# Patient Record
Sex: Male | Born: 1945 | Race: White | Hispanic: No | Marital: Single | Smoking: Former smoker
Health system: Southern US, Community
[De-identification: ages and names within clinical notes are randomized; demographics above are authoritative.]

## PROBLEM LIST (undated history)

## (undated) DIAGNOSIS — Z87442 Personal history of urinary calculi: Secondary | ICD-10-CM

## (undated) DIAGNOSIS — T8859XA Other complications of anesthesia, initial encounter: Secondary | ICD-10-CM

## (undated) DIAGNOSIS — I509 Heart failure, unspecified: Secondary | ICD-10-CM

## (undated) DIAGNOSIS — I219 Acute myocardial infarction, unspecified: Secondary | ICD-10-CM

## (undated) DIAGNOSIS — N179 Acute kidney failure, unspecified: Secondary | ICD-10-CM

## (undated) DIAGNOSIS — M1990 Unspecified osteoarthritis, unspecified site: Secondary | ICD-10-CM

## (undated) DIAGNOSIS — I739 Peripheral vascular disease, unspecified: Secondary | ICD-10-CM

## (undated) DIAGNOSIS — C801 Malignant (primary) neoplasm, unspecified: Secondary | ICD-10-CM

## (undated) DIAGNOSIS — N390 Urinary tract infection, site not specified: Secondary | ICD-10-CM

## (undated) DIAGNOSIS — I10 Essential (primary) hypertension: Secondary | ICD-10-CM

## (undated) DIAGNOSIS — R519 Headache, unspecified: Secondary | ICD-10-CM

## (undated) DIAGNOSIS — J449 Chronic obstructive pulmonary disease, unspecified: Secondary | ICD-10-CM

## (undated) DIAGNOSIS — S0292XA Unspecified fracture of facial bones, initial encounter for closed fracture: Secondary | ICD-10-CM

## (undated) DIAGNOSIS — I209 Angina pectoris, unspecified: Secondary | ICD-10-CM

## (undated) DIAGNOSIS — Z9289 Personal history of other medical treatment: Secondary | ICD-10-CM

## (undated) DIAGNOSIS — R0602 Shortness of breath: Secondary | ICD-10-CM

## (undated) DIAGNOSIS — R51 Headache: Secondary | ICD-10-CM

## (undated) DIAGNOSIS — S82121K Displaced fracture of lateral condyle of right tibia, subsequent encounter for closed fracture with nonunion: Secondary | ICD-10-CM

## (undated) DIAGNOSIS — K219 Gastro-esophageal reflux disease without esophagitis: Secondary | ICD-10-CM

## (undated) DIAGNOSIS — T4145XA Adverse effect of unspecified anesthetic, initial encounter: Secondary | ICD-10-CM

## (undated) DIAGNOSIS — F419 Anxiety disorder, unspecified: Secondary | ICD-10-CM

## (undated) DIAGNOSIS — I2510 Atherosclerotic heart disease of native coronary artery without angina pectoris: Secondary | ICD-10-CM

## (undated) HISTORY — DX: Essential (primary) hypertension: I10

## (undated) HISTORY — DX: Atherosclerotic heart disease of native coronary artery without angina pectoris: I25.10

## (undated) HISTORY — PX: CHOLECYSTECTOMY: SHX55

## (undated) HISTORY — DX: Unspecified fracture of facial bones, initial encounter for closed fracture: S02.92XA

## (undated) HISTORY — DX: Malignant (primary) neoplasm, unspecified: C80.1

## (undated) HISTORY — PX: OTHER SURGICAL HISTORY: SHX169

## (undated) HISTORY — DX: Acute kidney failure, unspecified: N17.9

## (undated) HISTORY — PX: CARDIAC CATHETERIZATION: SHX172

## (undated) HISTORY — PX: HERNIA REPAIR: SHX51

---

## 2006-08-15 ENCOUNTER — Emergency Department (HOSPITAL_COMMUNITY): Admission: EM | Admit: 2006-08-15 | Discharge: 2006-08-15 | Payer: Self-pay | Admitting: Emergency Medicine

## 2007-03-07 HISTORY — PX: OTHER SURGICAL HISTORY: SHX169

## 2007-10-25 ENCOUNTER — Ambulatory Visit: Payer: Self-pay | Admitting: Internal Medicine

## 2007-10-25 ENCOUNTER — Inpatient Hospital Stay (HOSPITAL_COMMUNITY): Admission: EM | Admit: 2007-10-25 | Discharge: 2007-10-29 | Payer: Self-pay | Admitting: Emergency Medicine

## 2008-03-20 ENCOUNTER — Ambulatory Visit: Payer: Self-pay | Admitting: Cardiology

## 2008-12-16 ENCOUNTER — Other Ambulatory Visit: Payer: Self-pay | Admitting: Internal Medicine

## 2008-12-21 ENCOUNTER — Other Ambulatory Visit: Payer: Self-pay | Admitting: Internal Medicine

## 2009-01-01 ENCOUNTER — Other Ambulatory Visit: Payer: Self-pay | Admitting: Internal Medicine

## 2009-02-04 ENCOUNTER — Ambulatory Visit: Payer: Self-pay | Admitting: Internal Medicine

## 2009-05-07 ENCOUNTER — Ambulatory Visit: Payer: Self-pay | Admitting: Internal Medicine

## 2009-05-07 ENCOUNTER — Inpatient Hospital Stay: Payer: Self-pay | Admitting: Internal Medicine

## 2009-05-18 ENCOUNTER — Other Ambulatory Visit: Payer: Self-pay | Admitting: Internal Medicine

## 2009-06-01 ENCOUNTER — Other Ambulatory Visit: Payer: Self-pay | Admitting: Internal Medicine

## 2009-06-09 ENCOUNTER — Ambulatory Visit: Payer: Self-pay | Admitting: Urology

## 2009-08-18 ENCOUNTER — Ambulatory Visit: Payer: Self-pay | Admitting: Urology

## 2009-09-01 ENCOUNTER — Ambulatory Visit: Payer: Self-pay | Admitting: Urology

## 2009-10-12 ENCOUNTER — Inpatient Hospital Stay: Payer: Self-pay | Admitting: Internal Medicine

## 2010-07-19 NOTE — Discharge Summary (Signed)
NAMEJHAIR, WITHERINGTON NO.:  [id number]   MEDICAL RECORD NO.:  [id number]          PATIENT TYPE:  INP   LOCATION:  6524                         FACILITY:  [facility]   PHYSICIAN:  Veverly Fells. Excell Seltzer, MD  DATE OF BIRTH:  10-13-1945   DATE OF ADMISSION:  10/25/2007  DATE OF DISCHARGE:  10/29/2007                         DISCHARGE SUMMARY - REFERRING   DISCHARGE DIAGNOSES:  1. Non-ST elevated myocardial infarction with medical treatment for a      small occluded obtuse marginal 2 branch.  2. Hypertension.  3. Hyperlipidemia.  4. Tobacco use.  5. Alcohol use.  6. Thrombocytopenia.  7. Hyperglycemia with a normal hemoglobin A1c.  8. Slightly elevated TSH history as previously.   PROCEDURE PERFORMED:  Cardiac catheterization on October 28, 2007 by Dr.  Tonny Bollman.   BRIEF HISTORY:  Mr. Bruce [name] is a 65 year old white male who presented to  [facility] emergency room complaining of sudden onset of diaphoresis  followed by substernal chest discomfort associated with shortness of  breath and nausea while talking to friends.  He rested, without relief,  went home,  but his symptoms persisted.  The symptoms started around 11  a.m. and he finally presented to the emergency room around 6 o'clock.  After receiving IV nitroglycerin and aspirin, his discomfort had been  reduced from an 8 to a 3.  Enzymes were abnormal..   PAST MEDICAL HISTORY:  Notable for tobacco and alcohol use.   LABORATORY DATA:  Admission weight was 78 kg.  Admission H and H was  15.1 and 43.9, normal indices, platelets 183, WBCs 9.1.  Platelets  dropped on the October 26, 2007 to 127.  At the time of discharge, H and  H was 12.8 and 37.8, normal indices, platelets had increased to 141.  WBCs 7.4.  Admission PTT 31, PT 14.0.  Sodium 139, potassium 4.7, BUN  10, creatinine 1.0, glucose 105.  LFTs were within normal limits.  At  the time of discharge sodium was 141, potassium 4.0, BUN 6, creatinine  0.85,  glucose 87.  Hemoglobin A1c was 5.5.  Initial CK totals were  within normal limits.  However troponins were slightly elevated at 1.45,  0.72 and 0.35.  Fasting lipids on October 26, 2007 showed a total  cholesterol of 150, triglycerides of 60, HDL 37, LDL 101.  TSH was  slightly elevated at 5.215.  Heparin antibody screen on the October 27, 2007 was negative. Chest x-ray on October 25, 2007 did not show any  evidence of active disease.  EKG on admission showed normal sinus  rhythm, normal axis, nonspecific ST-T wave changes.  Subsequent EKGs  were essentially the same.   HOSPITAL COURSE:  The patient was admitted to [facility].  He  was placed on. Integrilin and low fractionated heparin, in addition to  aspirin and beta blocker.  Overnight, he did not have any further chest  discomfort.  Troponins were abnormal, indicating non-ST elevated  myocardial infarction.  His platelets dropped, but a  the HIT screen was  unremarkable.  Given the stable nature, catheterization was arranged  for  Monday.  He was counseled in regards to tobacco cessation.  TSH was  noted to be slightly elevated.  Thus a free T4 and T3 were ordered, but  are pending at the time of this dictation.  Cardiac rehab assisted with  ambulation and education.  By October 29, 2007, the patient was doing  well, ambulating the halls without difficulty.  It was felt that the  patient could be discharged home after Dr. Earmon Phoenix review.   DISPOSITION:  The patient is discharged home.  He is asked to maintain  low-sodium heart-healthy diet.  Activities are restricted in regards to  lifting, driving.  sexual activity or heavy exertion for 1 week.  Wound  care as per post catheterization supplemental sheet.  New medications  include aspirin 325 mg daily, metoprolol 25 mg b.i.d., Zocor 40 mg  nightly, nitroglycerin 0.4 as needed.  He was informed he could get his  medications at [facility] for 4 dollars.  He will need blood work in  6-8  weeks in regards to FLP and LFTs, since Zocor was initiated.  He was  advised no smoking or tobacco products and limit his alcohol to less  than 2 ounces of 2 beers a day.  He was asked to obtain a primary care  physician and to bring all his medications to all appointments,  particularly when he follows up with Dr. Excell Seltzer in the [facility] office  on November 12, 2007 at 2:15.   DISCHARGE TIME:  35 minutes.      Joellyn Rued, PA-C      Veverly Fells. Excell Seltzer, MD  Electronically Signed    EW/MEDQ  D:  10/29/2007  T:  10/29/2007  Job:  [id number]   cc:   Bevelyn Buckles. Bensimhon, MD  Veverly Fells. Excell Seltzer, MD

## 2010-07-19 NOTE — Assessment & Plan Note (Signed)
[facility]                            CARDIOLOGY OFFICE NOTE   NAME:Bruce [name], Bruce [name]                         MRN:          [id number]  DATE:03/20/2008                            DOB:          May 26, 1945    Mr. Bruce [name] is a 65 year old gentleman who was previously seen by Dr.  Excell [name].  On October 25, 2007, he was admitted to [facility] with  chest pain.  During that hospitalization, his troponins were abnormal.  He subsequently underwent cardiac catheterization on October 28, 2007.  His ejection fraction was 50%.  There is inferior hypokinesis.  There  was 30% proximal LAD.  There was 100% ostial lesion in the second  marginal, which is a very small vessel.  There was a 40% proximal lesion  in the right coronary artery.  It was felt that medical therapy was  indicated because the marginal had the appearance of a total occlusion  and the distal vessel did not feel via collaterals.  It is also felt not  amenable to PCI.  He was, therefore, treated medically and discharged.  Note, his TSH was minimally elevated during that hospitalization, but  his T3 and T4 were normal.  He also had low-platelet count, but  HIT  panel was normal by report.  The patient has had dyspnea on exertion  following discharge.  However, approximately 2 weeks ago, he developed  swelling in his left lower extremity followed by increased dyspnea.  He  apparently was admitted to [facility] and found to have deep venous  thrombosis as well as multiple pulmonary emboli.  He had an IVC filter  placed.  He also had an echocardiogram.  The right ventricle is mildly  dilated.  The right atrium is also mildly dilated.  Ejection fraction  was 45%.  The patient was placed on Coumadin.  He now returns for  followup.  Again, he has dyspnea on exertion.  There is no orthopnea,  PND, palpitations, presyncope, syncope, or exertional chest pain.   MEDICATIONS:  1. Coumadin as directed and  followed by Dr. Vear [name].  2. Pravachol 40 mg p.o. daily.  3. Metoprolol 25 mg p.o. b.i.d.  4. Enalapril 5 mg p.o. daily.  5. Aspirin 81 mg p.o. daily.   PHYSICAL EXAMINATION:  VITAL SIGNS:  Today, shows a blood pressure of  136/90 and his pulse is 73.  He weighs 184 pounds.  HEENT:  Normal.  NECK:  Supple.  CHEST:  Clear.  CARDIOVASCULAR:  Regular rate and rhythm.  ABDOMEN:  No tenderness.  EXTREMITIES:  Trace edema on the left.   DIAGNOSES:  1. Coronary artery disease - the patient has had no further chest      pain.  He will continue with his aspirin, ACE inhibitor, beta-      blocker, and statin.  Note, he has discontinued his tobacco use.  2. Recent deep vein thrombosis/pulmonary emboli - he will continue on      his Coumadin and this is being monitored by Dr. Vear [name].  3. Recent increased TSH - we will plan to  repeat his TSH in 1 week.  4. Hypertension - his blood pressure is elevated and I have asked him      to increase his enalapril to 10 mg p.o. daily.  We will check a      BMET in 1 week to follow his potassium and renal function.  I will      also check lipids and liver at that time.  5. Hyperlipidemia - he will continue on his Pravachol.  6. History of alcohol use.   I will ask him to follow up in [place] in approximately 3 months with  Dr. Excell [name].     Bruce Frieze Jens Som, MD, [facility]  Electronically Signed    BSC/MedQ  DD: 03/20/2008  DT: 03/21/2008  Job #: [id number]   cc:   Bruce [name]
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
## 2010-08-16 ENCOUNTER — Ambulatory Visit (INDEPENDENT_AMBULATORY_CARE_PROVIDER_SITE_OTHER): Payer: Medicaid Other | Admitting: Cardiovascular Disease

## 2010-08-16 ENCOUNTER — Encounter: Payer: Self-pay | Admitting: Cardiovascular Disease

## 2010-08-16 DIAGNOSIS — I2510 Atherosclerotic heart disease of native coronary artery without angina pectoris: Secondary | ICD-10-CM | POA: Insufficient documentation

## 2010-08-16 DIAGNOSIS — J449 Chronic obstructive pulmonary disease, unspecified: Secondary | ICD-10-CM | POA: Insufficient documentation

## 2010-08-16 DIAGNOSIS — I82409 Acute embolism and thrombosis of unspecified deep veins of unspecified lower extremity: Secondary | ICD-10-CM | POA: Insufficient documentation

## 2010-08-16 DIAGNOSIS — R252 Cramp and spasm: Secondary | ICD-10-CM

## 2010-08-16 DIAGNOSIS — J441 Chronic obstructive pulmonary disease with (acute) exacerbation: Secondary | ICD-10-CM | POA: Insufficient documentation

## 2010-08-16 DIAGNOSIS — R609 Edema, unspecified: Secondary | ICD-10-CM | POA: Insufficient documentation

## 2010-08-16 HISTORY — DX: Acute embolism and thrombosis of unspecified deep veins of unspecified lower extremity: I82.409

## 2010-08-16 HISTORY — DX: Cramp and spasm: R25.2

## 2010-08-16 HISTORY — DX: Edema, unspecified: R60.9

## 2010-08-16 MED ORDER — FUROSEMIDE 20 MG PO TABS: 20.0000 mg | ORAL_TABLET | Freq: Every day | ORAL | Status: DC

## 2010-08-16 NOTE — Patient Instructions (Signed)
Stop simvastatin for two weeks. If leg pain goes away, call the office. If you continue to have leg pain, restart the simvastatin. Start furosemide one a day in the AM for the swelling. When the swelling goes away, stop the furosemide. Take only for swelling. What how much you are drinking. Please call us if you have new issues that need to be addressed before your next appt.  We will call you for a follow up Appt. In 3 months

## 2010-08-16 NOTE — Assessment & Plan Note (Signed)
He has mild shortness of breath. He reports that he does not take any inhalers. He does have a 50 year smoking history. We did not check his oxygenation today. We'll try to obtain the echocardiogram from last year to confirm his right-sided pressures.

## 2010-08-16 NOTE — Assessment & Plan Note (Signed)
Currently with no symptoms of angina. No further workup at this time. Continue current medication regimen. 

## 2010-08-16 NOTE — Progress Notes (Signed)
   Patient ID: Bruce [name], male    DOB: 1946/02/23, 65 y.o.   MRN: [id number]  HPI Comments: Bruce [name] is a very pleasant 65 year old gentleman, patient of Dr. 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  He reports that he is unable to walk very far as his legs get very weak. He also has weakness of the muscles and cramping of his legs at nighttime. He has had worsening swelling of his lower extremities, worse on the left than the right. He is unable to put his shoes on as easily as before. He was recently started on HCTZ though he reports he has not had any significant diuresis and continues to have difficulty putting his shoes on. He does drink a significant amount of fluids and will finish a 2 L bottle of soda in 2 days among other drinks as well. He denies any significant change any shortness of breath which is mild at baseline.   EKG shows normal sinus rhythm with rate 65 beats per minute with no significant ST-T wave changes     Review of Systems  Constitutional: Positive for fatigue.  HENT: Negative.   Eyes: Negative.   Respiratory: Positive for shortness of breath.   Cardiovascular: Positive for leg swelling.  Gastrointestinal: Negative.   Musculoskeletal: Positive for myalgias and gait problem.  Skin: Negative.   Neurological: Negative.   Hematological: Negative.   Psychiatric/Behavioral: Negative.   All other systems reviewed and are negative.    BP 142/87  Pulse 65  Ht 6\' 1"  (1.854 m)  Wt 223 lb (101.152 kg)  BMI 29.42 kg/m2   Physical Exam  Nursing note and vitals reviewed. Constitutional: He is oriented to person, place,  and time. He appears well-developed and well-nourished.  HENT:  Head: Normocephalic.  Nose: Nose normal.  Mouth/Throat: Oropharynx is clear and moist.  Eyes: Conjunctivae are normal. Pupils are equal, round, and reactive to light.  Neck: Normal range of motion. Neck supple. No JVD present.  Cardiovascular: Normal rate, regular rhythm, S1 normal, S2 normal, normal heart sounds and intact distal pulses.  Exam reveals no gallop and no friction rub.   No murmur heard.      1+ pitting edema on the left lower extremity, trace edema on the right to below the knees also with some component of nonpitting edema  Pulmonary/Chest: Effort normal. No respiratory distress. He has decreased breath sounds. He has no wheezes. He has no rales. He exhibits no tenderness.  Abdominal: Soft. Bowel sounds are normal. He exhibits no distension. There is no tenderness.  Musculoskeletal: Normal range of motion. He exhibits edema. He exhibits no tenderness.  Lymphadenopathy:    He has no cervical adenopathy.  Neurological: He is alert and oriented to person, place, and time. Coordination normal.  Skin: Skin is warm and dry. No rash noted. No erythema.  Psychiatric: He has a normal mood and affect. His behavior is normal. Judgment and thought content normal.           Assessment and Plan

## 2010-08-16 NOTE — Assessment & Plan Note (Signed)
He does have leg weakness and cramping, particularly at nighttime. We have suggested that he hold the simvastatin for a two-week trial period. If his cramping and muscle weakness seemed to improve, we could try an alternate statin.

## 2010-08-16 NOTE — Assessment & Plan Note (Addendum)
I suspect he has chronic venous insufficiency from his history of DVTs. Given his recent exacerbation of the edema, I suspect he may have underlying pulmonary hypertension secondary to his lung disease and long history of smoking. This could contribute to fluid retention and worsening edema. We have suggested he start Lasix 20 mg daily with his HCTZ. I suggested he hold the Lasix when his edema improves and takes only as needed. His BNP was relatively normal. Given this, if there is no significant improvement with the Lasix, his edema could potentially be all secondary to worsening venous insufficiency and he will have to wear TED hose.

## 2010-08-16 NOTE — Assessment & Plan Note (Signed)
He will be be at risk of repeat DVT given his history before and his underlying venous insufficiency. He is on warfarin.

## 2010-09-12 ENCOUNTER — Encounter: Payer: Self-pay | Admitting: Cardiovascular Disease

## 2010-11-16 ENCOUNTER — Ambulatory Visit: Payer: Medicaid Other | Admitting: Cardiovascular Disease

## 2010-12-02 ENCOUNTER — Encounter: Payer: Self-pay | Admitting: Cardiovascular Disease

## 2010-12-05 ENCOUNTER — Other Ambulatory Visit: Payer: Self-pay | Admitting: *Deleted

## 2010-12-05 DIAGNOSIS — Z7901 Long term (current) use of anticoagulants: Secondary | ICD-10-CM

## 2010-12-05 LAB — PROTIME-INR: INR: 1.28 (ref <1.50)

## 2010-12-16 ENCOUNTER — Telehealth: Payer: Self-pay | Admitting: Internal Medicine

## 2010-12-16 ENCOUNTER — Emergency Department: Payer: Self-pay | Admitting: *Deleted

## 2010-12-16 NOTE — Telephone Encounter (Signed)
Pt son called  Bruce [name] has medicaid [employer].  His son talked Bruce [name] Bruce [name] case Financial controller.  She said the only way Bruce [name] can get his medicaid changed was if you call [place].  His caseworker didn't give him a name or number to call.   Bruce [name] is out of his meds he took them last night.  Pt is having chest and leg pains.

## 2010-12-22 LAB — I-STAT 8, (EC8 V) (CONVERTED LAB)
BUN: 14
Bicarbonate: 25.5 — ABNORMAL HIGH
Glucose, Bld: 89
HCT: 48
Hemoglobin: 16.3
Operator id: 189501
Potassium: 4.3
Sodium: 140

## 2010-12-29 ENCOUNTER — Ambulatory Visit: Payer: Self-pay | Admitting: Cardiovascular Disease

## 2011-01-10 MED ORDER — ALPRAZOLAM 1 MG PO TABS: 1.0000 mg | ORAL_TABLET | Freq: Two times a day (BID) | ORAL | Status: DC | PRN

## 2011-01-10 NOTE — Telephone Encounter (Signed)
Pharm sent fax requesting RF of alprazolam 1 mg (not extended release) 1 tab bid. This message was routed to the wrong Walker on 10/12. I will call to check on patient and status of medicaid, is this ok to RF?

## 2011-01-10 NOTE — Telephone Encounter (Signed)
Fine to refill 1 month with 3 refills. We need to try to get him in.

## 2011-01-17 NOTE — Telephone Encounter (Signed)
Spoke w/son, Pt has been seen by Dr Welton Flakes recently but can not afford to return for f/u at this time. I will obtain medicaid form for MD to complete to try to change from [place] access to regular medicaid.

## 2011-02-07 ENCOUNTER — Other Ambulatory Visit: Payer: Self-pay | Admitting: *Deleted

## 2011-02-07 MED ORDER — HYDROCHLOROTHIAZIDE 12.5 MG PO CAPS: 12.5000 mg | ORAL_CAPSULE | Freq: Every day | ORAL | Status: DC

## 2011-02-07 MED ORDER — LORATADINE 10 MG PO TABS: 10.0000 mg | ORAL_TABLET | Freq: Every day | ORAL | Status: DC

## 2011-02-07 MED ORDER — METOPROLOL SUCCINATE ER 50 MG PO TB24: 50.0000 mg | ORAL_TABLET | Freq: Every day | ORAL | Status: DC

## 2011-04-12 ENCOUNTER — Other Ambulatory Visit: Payer: Self-pay | Admitting: *Deleted

## 2011-04-12 MED ORDER — LORATADINE 10 MG PO TABS: 10.0000 mg | ORAL_TABLET | Freq: Every day | ORAL | Status: DC

## 2011-04-12 MED ORDER — OMEPRAZOLE 20 MG PO CPDR: 20.0000 mg | DELAYED_RELEASE_CAPSULE | Freq: Every day | ORAL | Status: DC

## 2011-04-12 MED ORDER — METOPROLOL SUCCINATE ER 50 MG PO TB24: 50.0000 mg | ORAL_TABLET | Freq: Every day | ORAL | Status: DC

## 2011-04-12 MED ORDER — HYDROCHLOROTHIAZIDE 12.5 MG PO CAPS: 12.5000 mg | ORAL_CAPSULE | Freq: Every day | ORAL | Status: DC

## 2011-05-04 ENCOUNTER — Other Ambulatory Visit: Payer: Self-pay | Admitting: *Deleted

## 2011-05-04 MED ORDER — LISINOPRIL 30 MG PO TABS: 30.0000 mg | ORAL_TABLET | Freq: Every day | ORAL | Status: DC

## 2011-05-04 NOTE — Telephone Encounter (Signed)
Is this patient going to be seen at our clinic?

## 2011-05-04 NOTE — Telephone Encounter (Signed)
Patient says that he is not going to be coming here to be seen. He says he was told that with his new insurance [facility] is not in network, and he is working on getting get set up with another Dr., but is not sure when or where and he is asking if you would be willing to give him at least one refill until he is established with some one else.

## 2011-05-04 NOTE — Telephone Encounter (Signed)
Fine to refill x 1.  Please let him know that we would love to keep him as a patient.  I think he has medicaid and we except medicaid, just not [place] access.

## 2011-05-05 MED ORDER — ALPRAZOLAM 1 MG PO TABS: 1.0000 mg | ORAL_TABLET | Freq: Two times a day (BID) | ORAL | Status: AC | PRN

## 2011-05-05 NOTE — Telephone Encounter (Signed)
Rx called to pharmacy. Patient says that he does want to stay with Dr. Dan Humphreys and is trying to get his [place] access medicaid changed to regular medicaid.

## 2011-08-09 ENCOUNTER — Other Ambulatory Visit: Payer: Self-pay | Admitting: *Deleted

## 2011-08-09 MED ORDER — HYDROCHLOROTHIAZIDE 12.5 MG PO CAPS: 12.5000 mg | ORAL_CAPSULE | Freq: Every day | ORAL | Status: DC

## 2011-08-09 MED ORDER — WARFARIN SODIUM 1 MG PO TABS: 1.0000 mg | ORAL_TABLET | ORAL | Status: DC

## 2011-08-09 MED ORDER — LISINOPRIL 30 MG PO TABS: 30.0000 mg | ORAL_TABLET | Freq: Every day | ORAL | Status: DC

## 2011-09-04 ENCOUNTER — Other Ambulatory Visit: Payer: Self-pay | Admitting: *Deleted

## 2011-09-04 NOTE — Telephone Encounter (Signed)
Spoke with pt son and informed him to tell pt to return call to set up f/u appointment.

## 2012-03-06 HISTORY — PX: FRACTURE SURGERY: SHX138

## 2012-05-07 ENCOUNTER — Ambulatory Visit: Payer: Self-pay | Admitting: Family Medicine

## 2013-06-26 ENCOUNTER — Ambulatory Visit: Payer: Self-pay | Admitting: Cardiovascular Disease

## 2013-10-06 ENCOUNTER — Emergency Department (HOSPITAL_COMMUNITY): Payer: Medicare HMO

## 2013-10-06 ENCOUNTER — Inpatient Hospital Stay (HOSPITAL_COMMUNITY)
Admission: EM | Admit: 2013-10-06 | Discharge: 2013-10-11 | DRG: 492 | Disposition: A | Discharge status: Home Health | Payer: Medicare HMO | Attending: General Surgery | Admitting: General Surgery

## 2013-10-06 ENCOUNTER — Emergency Department (HOSPITAL_COMMUNITY): Payer: Medicare HMO | Admitting: Certified Registered"

## 2013-10-06 ENCOUNTER — Encounter (HOSPITAL_COMMUNITY): Payer: Self-pay | Admitting: *Deleted

## 2013-10-06 ENCOUNTER — Encounter (HOSPITAL_COMMUNITY): Admission: EM | Disposition: A | Discharge status: Home Health | Payer: Self-pay | Source: Home / Self Care

## 2013-10-06 ENCOUNTER — Encounter (HOSPITAL_COMMUNITY): Payer: Medicare HMO | Admitting: Certified Registered"

## 2013-10-06 DIAGNOSIS — Z86718 Personal history of other venous thrombosis and embolism: Secondary | ICD-10-CM | POA: Diagnosis not present

## 2013-10-06 DIAGNOSIS — S82409A Unspecified fracture of shaft of unspecified fibula, initial encounter for closed fracture: Secondary | ICD-10-CM

## 2013-10-06 DIAGNOSIS — T798XXA Other early complications of trauma, initial encounter: Secondary | ICD-10-CM | POA: Diagnosis present

## 2013-10-06 DIAGNOSIS — I959 Hypotension, unspecified: Secondary | ICD-10-CM | POA: Diagnosis present

## 2013-10-06 DIAGNOSIS — S82109A Unspecified fracture of upper end of unspecified tibia, initial encounter for closed fracture: Principal | ICD-10-CM | POA: Diagnosis present

## 2013-10-06 DIAGNOSIS — X58XXXA Exposure to other specified factors, initial encounter: Secondary | ICD-10-CM | POA: Diagnosis present

## 2013-10-06 DIAGNOSIS — D62 Acute posthemorrhagic anemia: Secondary | ICD-10-CM | POA: Diagnosis not present

## 2013-10-06 DIAGNOSIS — S82209A Unspecified fracture of shaft of unspecified tibia, initial encounter for closed fracture: Secondary | ICD-10-CM | POA: Diagnosis present

## 2013-10-06 DIAGNOSIS — S8991XA Unspecified injury of right lower leg, initial encounter: Secondary | ICD-10-CM

## 2013-10-06 DIAGNOSIS — T1490XA Injury, unspecified, initial encounter: Secondary | ICD-10-CM

## 2013-10-06 DIAGNOSIS — I10 Essential (primary) hypertension: Secondary | ICD-10-CM | POA: Diagnosis present

## 2013-10-06 DIAGNOSIS — T79A0XA Compartment syndrome, unspecified, initial encounter: Secondary | ICD-10-CM

## 2013-10-06 DIAGNOSIS — S85009A Unspecified injury of popliteal artery, unspecified leg, initial encounter: Secondary | ICD-10-CM | POA: Diagnosis present

## 2013-10-06 DIAGNOSIS — K219 Gastro-esophageal reflux disease without esophagitis: Secondary | ICD-10-CM | POA: Diagnosis present

## 2013-10-06 DIAGNOSIS — J45909 Unspecified asthma, uncomplicated: Secondary | ICD-10-CM | POA: Diagnosis present

## 2013-10-06 DIAGNOSIS — T148XXA Other injury of unspecified body region, initial encounter: Secondary | ICD-10-CM

## 2013-10-06 DIAGNOSIS — F411 Generalized anxiety disorder: Secondary | ICD-10-CM | POA: Diagnosis present

## 2013-10-06 DIAGNOSIS — Z7901 Long term (current) use of anticoagulants: Secondary | ICD-10-CM

## 2013-10-06 DIAGNOSIS — I252 Old myocardial infarction: Secondary | ICD-10-CM | POA: Diagnosis not present

## 2013-10-06 DIAGNOSIS — S8990XA Unspecified injury of unspecified lower leg, initial encounter: Secondary | ICD-10-CM

## 2013-10-06 HISTORY — DX: Unspecified fracture of shaft of unspecified tibia, initial encounter for closed fracture: S82.209A

## 2013-10-06 HISTORY — PX: EXTERNAL FIXATION LEG: SHX1549

## 2013-10-06 HISTORY — DX: Unspecified fracture of shaft of unspecified tibia, initial encounter for closed fracture: S82.409A

## 2013-10-06 HISTORY — DX: Acute myocardial infarction, unspecified: I21.9

## 2013-10-06 HISTORY — PX: FEMORAL-POPLITEAL BYPASS GRAFT: SHX937

## 2013-10-06 LAB — COMPREHENSIVE METABOLIC PANEL
ALK PHOS: 59 U/L (ref 39–117)
ALT: 21 U/L (ref 0–53)
AST: 32 U/L (ref 0–37)
Albumin: 2.6 g/dL — ABNORMAL LOW (ref 3.5–5.2)
Anion gap: 12 (ref 5–15)
BUN: 14 mg/dL (ref 6–23)
CO2: 22 meq/L (ref 19–32)
Calcium: 7.6 mg/dL — ABNORMAL LOW (ref 8.4–10.5)
Chloride: 106 mEq/L (ref 96–112)
Creatinine, Ser: 1.29 mg/dL (ref 0.50–1.35)
GFR calc Af Amer: 65 mL/min — ABNORMAL LOW (ref >90)
GFR calc non Af Amer: 56 mL/min — ABNORMAL LOW (ref >90)
Glucose, Bld: 137 mg/dL — ABNORMAL HIGH (ref 70–99)
Potassium: 4.3 mEq/L (ref 3.7–5.3)
SODIUM: 140 meq/L (ref 137–147)
TOTAL PROTEIN: 4.5 g/dL — AB (ref 6.0–8.3)
Total Bilirubin: 0.3 mg/dL (ref 0.3–1.2)

## 2013-10-06 LAB — POCT I-STAT 7, (LYTES, BLD GAS, ICA,H+H)
ACID-BASE DEFICIT: 2 mmol/L (ref 0.0–2.0)
Bicarbonate: 24.4 mEq/L — ABNORMAL HIGH (ref 20.0–24.0)
Calcium, Ion: 1.19 mmol/L (ref 1.13–1.30)
HCT: 27 % — ABNORMAL LOW (ref 39.0–52.0)
Hemoglobin: 9.2 g/dL — ABNORMAL LOW (ref 13.0–17.0)
O2 SAT: 100 %
Patient temperature: 35.8
Potassium: 3.9 mEq/L (ref 3.7–5.3)
SODIUM: 138 meq/L (ref 137–147)
TCO2: 26 mmol/L (ref 0–100)
pCO2 arterial: 49 mmHg — ABNORMAL HIGH (ref 35.0–45.0)
pH, Arterial: 7.299 — ABNORMAL LOW (ref 7.350–7.450)
pO2, Arterial: 299 mmHg — ABNORMAL HIGH (ref 80.0–100.0)

## 2013-10-06 LAB — CBC
HCT: 28 % — ABNORMAL LOW (ref 39.0–52.0)
HEMATOCRIT: 31.9 % — AB (ref 39.0–52.0)
HEMOGLOBIN: 9.7 g/dL — AB (ref 13.0–17.0)
HEMOGLOBIN: 10.9 g/dL — AB (ref 13.0–17.0)
MCH: 32.7 pg (ref 26.0–34.0)
MCH: 31.2 pg (ref 26.0–34.0)
MCHC: 34.6 g/dL (ref 30.0–36.0)
MCHC: 34.2 g/dL (ref 30.0–36.0)
MCV: 94.3 fL (ref 78.0–100.0)
MCV: 91.4 fL (ref 78.0–100.0)
Platelets: 193 10*3/uL (ref 150–400)
Platelets: 163 10*3/uL (ref 150–400)
RBC: 2.97 MIL/uL — AB (ref 4.22–5.81)
RBC: 3.49 MIL/uL — ABNORMAL LOW (ref 4.22–5.81)
RDW: 12.9 % (ref 11.5–15.5)
RDW: 13.8 % (ref 11.5–15.5)
WBC: 21.9 10*3/uL — AB (ref 4.0–10.5)
WBC: 11.6 10*3/uL — ABNORMAL HIGH (ref 4.0–10.5)

## 2013-10-06 LAB — PROTIME-INR
INR: 1.28 (ref 0.00–1.49)
Prothrombin Time: 16 seconds — ABNORMAL HIGH (ref 11.6–15.2)

## 2013-10-06 LAB — PREPARE RBC (CROSSMATCH)

## 2013-10-06 LAB — MRSA PCR SCREENING: MRSA by PCR: NEGATIVE

## 2013-10-06 LAB — ABO/RH: ABO/RH(D): B POS

## 2013-10-06 LAB — APTT: aPTT: 21 seconds — ABNORMAL LOW (ref 24–37)

## 2013-10-06 SURGERY — EXTERNAL FIXATION, LOWER EXTREMITY
Anesthesia: General | Site: Leg Lower | Laterality: Right

## 2013-10-06 MED ORDER — PROTAMINE SULFATE 10 MG/ML IV SOLN
INTRAVENOUS | Status: DC | PRN
  Administered 2013-10-06 (×5): 10 mg via INTRAVENOUS

## 2013-10-06 MED ORDER — FENTANYL CITRATE 0.05 MG/ML IJ SOLN
INTRAMUSCULAR | Status: AC
  Filled 2013-10-06: qty 5

## 2013-10-06 MED ORDER — FENTANYL CITRATE 0.05 MG/ML IJ SOLN
INTRAMUSCULAR | Status: AC
  Administered 2013-10-06: 50 ug
  Filled 2013-10-06: qty 2

## 2013-10-06 MED ORDER — DOCUSATE SODIUM 100 MG PO CAPS
100.0000 mg | ORAL_CAPSULE | Freq: Two times a day (BID) | ORAL | Status: DC
  Administered 2013-10-07 – 2013-10-11 (×9): 100 mg via ORAL
  Filled 2013-10-06 (×12): qty 1

## 2013-10-06 MED ORDER — HYDROMORPHONE HCL PF 1 MG/ML IJ SOLN
0.5000 mg | INTRAMUSCULAR | Status: DC | PRN
  Administered 2013-10-06 – 2013-10-11 (×6): 0.5 mg via INTRAVENOUS
  Filled 2013-10-06 (×6): qty 1

## 2013-10-06 MED ORDER — 0.9 % SODIUM CHLORIDE (POUR BTL) OPTIME
TOPICAL | Status: DC | PRN
  Administered 2013-10-06 (×2): 1000 mL

## 2013-10-06 MED ORDER — NEOSTIGMINE METHYLSULFATE 10 MG/10ML IV SOLN
INTRAVENOUS | Status: AC
  Filled 2013-10-06: qty 3

## 2013-10-06 MED ORDER — PROPOFOL 10 MG/ML IV BOLUS
INTRAVENOUS | Status: AC
  Filled 2013-10-06: qty 20

## 2013-10-06 MED ORDER — HYDROMORPHONE HCL PF 1 MG/ML IJ SOLN
0.2500 mg | INTRAMUSCULAR | Status: DC | PRN
  Administered 2013-10-06 (×2): 0.5 mg via INTRAVENOUS

## 2013-10-06 MED ORDER — ENOXAPARIN SODIUM 30 MG/0.3ML ~~LOC~~ SOLN
30.0000 mg | Freq: Two times a day (BID) | SUBCUTANEOUS | Status: DC
  Administered 2013-10-07 – 2013-10-11 (×9): 30 mg via SUBCUTANEOUS
  Filled 2013-10-06 (×12): qty 0.3

## 2013-10-06 MED ORDER — POLYETHYLENE GLYCOL 3350 17 G PO PACK
17.0000 g | PACK | Freq: Every day | ORAL | Status: DC
  Administered 2013-10-10 – 2013-10-11 (×2): 17 g via ORAL
  Filled 2013-10-06 (×6): qty 1

## 2013-10-06 MED ORDER — OXYCODONE HCL 5 MG PO TABS
10.0000 mg | ORAL_TABLET | ORAL | Status: DC | PRN
  Administered 2013-10-06: 10 mg via ORAL
  Administered 2013-10-07 – 2013-10-08 (×5): 15 mg via ORAL
  Administered 2013-10-08: 20 mg via ORAL
  Administered 2013-10-09: 5 mg via ORAL
  Administered 2013-10-09 – 2013-10-11 (×9): 20 mg via ORAL
  Filled 2013-10-06 (×2): qty 4
  Filled 2013-10-06: qty 2
  Filled 2013-10-06: qty 4
  Filled 2013-10-06 (×3): qty 3
  Filled 2013-10-06 (×5): qty 4
  Filled 2013-10-06: qty 3
  Filled 2013-10-06 (×3): qty 4
  Filled 2013-10-06: qty 3
  Filled 2013-10-06: qty 2
  Filled 2013-10-06: qty 3

## 2013-10-06 MED ORDER — HYDROMORPHONE HCL PF 1 MG/ML IJ SOLN
INTRAMUSCULAR | Status: AC
  Filled 2013-10-06: qty 1

## 2013-10-06 MED ORDER — SUCCINYLCHOLINE CHLORIDE 20 MG/ML IJ SOLN
INTRAMUSCULAR | Status: AC
  Filled 2013-10-06: qty 1

## 2013-10-06 MED ORDER — PANTOPRAZOLE SODIUM 40 MG IV SOLR
40.0000 mg | Freq: Every day | INTRAVENOUS | Status: DC
  Administered 2013-10-06: 40 mg via INTRAVENOUS
  Filled 2013-10-06 (×4): qty 40

## 2013-10-06 MED ORDER — LIDOCAINE HCL (CARDIAC) 20 MG/ML IV SOLN
INTRAVENOUS | Status: DC | PRN
  Administered 2013-10-06: 60 mg via INTRAVENOUS

## 2013-10-06 MED ORDER — SODIUM CHLORIDE 0.9 % IV BOLUS (SEPSIS)
1000.0000 mL | Freq: Once | INTRAVENOUS | Status: AC
  Administered 2013-10-06: 1000 mL via INTRAVENOUS

## 2013-10-06 MED ORDER — ONDANSETRON HCL 4 MG/2ML IJ SOLN
INTRAMUSCULAR | Status: AC
  Filled 2013-10-06: qty 2

## 2013-10-06 MED ORDER — POTASSIUM CHLORIDE IN NACL 20-0.45 MEQ/L-% IV SOLN
INTRAVENOUS | Status: DC
  Administered 2013-10-06 – 2013-10-09 (×6): via INTRAVENOUS
  Filled 2013-10-06 (×7): qty 1000

## 2013-10-06 MED ORDER — SODIUM CHLORIDE 0.9 % IV SOLN
INTRAVENOUS | Status: DC | PRN
  Administered 2013-10-06 (×2): via INTRAVENOUS

## 2013-10-06 MED ORDER — GLYCOPYRROLATE 0.2 MG/ML IJ SOLN
INTRAMUSCULAR | Status: DC | PRN
  Administered 2013-10-06: .6 mg via INTRAVENOUS

## 2013-10-06 MED ORDER — FENTANYL CITRATE 0.05 MG/ML IJ SOLN
INTRAMUSCULAR | Status: DC | PRN
  Administered 2013-10-06: 100 ug via INTRAVENOUS
  Administered 2013-10-06 (×3): 50 ug via INTRAVENOUS

## 2013-10-06 MED ORDER — ROCURONIUM BROMIDE 50 MG/5ML IV SOLN
INTRAVENOUS | Status: AC
  Filled 2013-10-06: qty 2

## 2013-10-06 MED ORDER — OXYCODONE HCL 5 MG/5ML PO SOLN: 5.0000 mg | Freq: Once | ORAL | Status: DC | PRN

## 2013-10-06 MED ORDER — SODIUM CHLORIDE 0.9 % IV SOLN: Freq: Once | INTRAVENOUS | Status: DC

## 2013-10-06 MED ORDER — LIDOCAINE HCL (CARDIAC) 20 MG/ML IV SOLN
INTRAVENOUS | Status: AC
  Filled 2013-10-06: qty 5

## 2013-10-06 MED ORDER — ONDANSETRON HCL 4 MG PO TABS
4.0000 mg | ORAL_TABLET | Freq: Four times a day (QID) | ORAL | Status: DC | PRN
  Administered 2013-10-09 – 2013-10-10 (×2): 4 mg via ORAL
  Filled 2013-10-06 (×2): qty 1

## 2013-10-06 MED ORDER — NEOSTIGMINE METHYLSULFATE 10 MG/10ML IV SOLN
INTRAVENOUS | Status: DC | PRN
  Administered 2013-10-06: 4 mg via INTRAVENOUS

## 2013-10-06 MED ORDER — LACTATED RINGERS IV SOLN
INTRAVENOUS | Status: DC | PRN
  Administered 2013-10-06: 15:00:00 via INTRAVENOUS

## 2013-10-06 MED ORDER — IOHEXOL 350 MG/ML SOLN: 100.0000 mL | Freq: Once | INTRAVENOUS | Status: AC | PRN

## 2013-10-06 MED ORDER — LACTATED RINGERS IV SOLN
INTRAVENOUS | Status: DC | PRN
  Administered 2013-10-06 (×2): via INTRAVENOUS

## 2013-10-06 MED ORDER — ONDANSETRON HCL 4 MG/2ML IJ SOLN
4.0000 mg | Freq: Four times a day (QID) | INTRAMUSCULAR | Status: DC | PRN
  Administered 2013-10-06 – 2013-10-08 (×2): 4 mg via INTRAVENOUS
  Filled 2013-10-06 (×2): qty 2

## 2013-10-06 MED ORDER — GLYCOPYRROLATE 0.2 MG/ML IJ SOLN
INTRAMUSCULAR | Status: AC
  Filled 2013-10-06: qty 3

## 2013-10-06 MED ORDER — HEPARIN SODIUM (PORCINE) 1000 UNIT/ML IJ SOLN
INTRAMUSCULAR | Status: DC | PRN
  Administered 2013-10-06: 6000 [IU] via INTRAVENOUS

## 2013-10-06 MED ORDER — OXYCODONE HCL 5 MG PO TABS: 5.0000 mg | ORAL_TABLET | Freq: Once | ORAL | Status: DC | PRN

## 2013-10-06 MED ORDER — ETOMIDATE 2 MG/ML IV SOLN
INTRAVENOUS | Status: AC
  Filled 2013-10-06: qty 20

## 2013-10-06 MED ORDER — ONDANSETRON HCL 4 MG/2ML IJ SOLN
INTRAMUSCULAR | Status: DC | PRN
  Administered 2013-10-06: 4 mg via INTRAVENOUS

## 2013-10-06 MED ORDER — SODIUM CHLORIDE 0.9 % IV BOLUS (SEPSIS)
500.0000 mL | Freq: Once | INTRAVENOUS | Status: AC
  Administered 2013-10-06: 500 mL via INTRAVENOUS

## 2013-10-06 MED ORDER — CEFAZOLIN SODIUM-DEXTROSE 2-3 GM-% IV SOLR
INTRAVENOUS | Status: DC | PRN
  Administered 2013-10-06: 2 g via INTRAVENOUS

## 2013-10-06 MED ORDER — HEPARIN SODIUM (PORCINE) 5000 UNIT/ML IJ SOLN
INTRAMUSCULAR | Status: DC | PRN
  Administered 2013-10-06: 16:00:00

## 2013-10-06 MED ORDER — CETYLPYRIDINIUM CHLORIDE 0.05 % MT LIQD
7.0000 mL | Freq: Two times a day (BID) | OROMUCOSAL | Status: DC
  Administered 2013-10-07 – 2013-10-11 (×6): 7 mL via OROMUCOSAL

## 2013-10-06 MED ORDER — SUCCINYLCHOLINE CHLORIDE 20 MG/ML IJ SOLN
INTRAMUSCULAR | Status: DC | PRN
  Administered 2013-10-06: 100 mg via INTRAVENOUS

## 2013-10-06 MED ORDER — PROPOFOL 10 MG/ML IV BOLUS
INTRAVENOUS | Status: DC | PRN
  Administered 2013-10-06: 80 mg via INTRAVENOUS

## 2013-10-06 MED ORDER — PANTOPRAZOLE SODIUM 40 MG PO TBEC
40.0000 mg | DELAYED_RELEASE_TABLET | Freq: Every day | ORAL | Status: DC
  Administered 2013-10-07 – 2013-10-08 (×2): 40 mg via ORAL
  Filled 2013-10-06 (×2): qty 1

## 2013-10-06 MED ORDER — SODIUM CHLORIDE 0.9 % IV SOLN
INTRAVENOUS | Status: DC | PRN
  Administered 2013-10-06: 18:00:00 via INTRAVENOUS

## 2013-10-06 MED ORDER — PHENYLEPHRINE HCL 10 MG/ML IJ SOLN
10.0000 mg | INTRAVENOUS | Status: DC | PRN
  Administered 2013-10-06: 30 ug/min via INTRAVENOUS

## 2013-10-06 MED ORDER — ROCURONIUM BROMIDE 100 MG/10ML IV SOLN
INTRAVENOUS | Status: DC | PRN
  Administered 2013-10-06: 50 mg via INTRAVENOUS
  Administered 2013-10-06: 10 mg via INTRAVENOUS

## 2013-10-06 MED ORDER — ONDANSETRON HCL 4 MG/2ML IJ SOLN: 4.0000 mg | Freq: Once | INTRAMUSCULAR | Status: DC | PRN

## 2013-10-06 SURGICAL SUPPLY — 118 items
APL SKNCLS STERI-STRIP NONHPOA (GAUZE/BANDAGES/DRESSINGS) ×2
BANDAGE ELASTIC 4 VELCRO ST LF (GAUZE/BANDAGES/DRESSINGS) ×4 IMPLANT
BANDAGE ELASTIC 6 VELCRO ST LF (GAUZE/BANDAGES/DRESSINGS) ×4 IMPLANT
BANDAGE ESMARK 6X9 LF (GAUZE/BANDAGES/DRESSINGS) ×1 IMPLANT
BAR EXFX 600X11 NS LF (MISCELLANEOUS) ×4
BAR GLASS FIBER EXFX 11X600 (MISCELLANEOUS) ×6 IMPLANT
BENZOIN TINCTURE PRP APPL 2/3 (GAUZE/BANDAGES/DRESSINGS) ×4 IMPLANT
BLADE 10 SAFETY STRL DISP (BLADE) ×4 IMPLANT
BLADE CLIPPER SURG (BLADE) ×6 IMPLANT
BLADE SURG 10 STRL SS (BLADE) ×3 IMPLANT
BLADE SURG 15 STRL LF DISP TIS (BLADE) ×1 IMPLANT
BLADE SURG 15 STRL SS (BLADE) ×4
BNDG CMPR 9X6 STRL LF SNTH (GAUZE/BANDAGES/DRESSINGS)
BNDG COHESIVE 4X5 TAN STRL (GAUZE/BANDAGES/DRESSINGS) ×1 IMPLANT
BNDG COHESIVE 6X5 TAN STRL LF (GAUZE/BANDAGES/DRESSINGS) ×1 IMPLANT
BNDG ESMARK 6X9 LF (GAUZE/BANDAGES/DRESSINGS)
BNDG GAUZE ELAST 4 BULKY (GAUZE/BANDAGES/DRESSINGS) ×5 IMPLANT
BRUSH SCRUB DISP (MISCELLANEOUS) ×8 IMPLANT
CANISTER SUCTION 2500CC (MISCELLANEOUS) ×4 IMPLANT
CANISTER WOUND CARE 500ML ATS (WOUND CARE) ×3 IMPLANT
CANNULA VESSEL 3MM 2 BLNT TIP (CANNULA) ×4 IMPLANT
CLEANER TIP ELECTROSURG 2X2 (MISCELLANEOUS) ×1 IMPLANT
CLIP LIGATING EXTRA MED SLVR (CLIP) ×4 IMPLANT
CLIP LIGATING EXTRA SM BLUE (MISCELLANEOUS) ×4 IMPLANT
CLOSURE WOUND 1/2 X4 (GAUZE/BANDAGES/DRESSINGS) ×1
COVER MAYO STAND STRL (DRAPES) ×7 IMPLANT
COVER PROBE W GEL 5X96 (DRAPES) ×3 IMPLANT
COVER SURGICAL LIGHT HANDLE (MISCELLANEOUS) ×6 IMPLANT
CUFF TOURNIQUET SINGLE 18IN (TOURNIQUET CUFF) IMPLANT
CUFF TOURNIQUET SINGLE 24IN (TOURNIQUET CUFF) IMPLANT
CUFF TOURNIQUET SINGLE 34IN LL (TOURNIQUET CUFF) IMPLANT
CUFF TOURNIQUET SINGLE 44IN (TOURNIQUET CUFF) IMPLANT
DOPPLER CAUTERY SUPPRESSOR (INSTRUMENTS) ×3 IMPLANT
DRAIN SNY 10X20 3/4 PERF (WOUND CARE) IMPLANT
DRAPE C-ARM 42X72 X-RAY (DRAPES) ×7 IMPLANT
DRAPE C-ARMOR (DRAPES) ×4 IMPLANT
DRAPE INCISE IOBAN 66X45 STRL (DRAPES) ×1 IMPLANT
DRAPE ORTHO SPLIT 77X108 STRL (DRAPES) ×8
DRAPE PROXIMA HALF (DRAPES) ×3 IMPLANT
DRAPE SURG ORHT 6 SPLT 77X108 (DRAPES) ×4 IMPLANT
DRAPE U-SHAPE 47X51 STRL (DRAPES) ×4 IMPLANT
DRAPE WARM FLUID 44X44 (DRAPE) ×4 IMPLANT
DRAPE X-RAY CASS 24X20 (DRAPES) IMPLANT
DRSG ADAPTIC 3X8 NADH LF (GAUZE/BANDAGES/DRESSINGS) ×1 IMPLANT
DRSG COVADERM 4X10 (GAUZE/BANDAGES/DRESSINGS) IMPLANT
DRSG COVADERM 4X8 (GAUZE/BANDAGES/DRESSINGS) ×3 IMPLANT
DRSG VAC ATS LRG SENSATRAC (GAUZE/BANDAGES/DRESSINGS) ×3 IMPLANT
ELECT REM PT RETURN 9FT ADLT (ELECTROSURGICAL) ×4
ELECTRODE REM PT RTRN 9FT ADLT (ELECTROSURGICAL) ×3 IMPLANT
EVACUATOR 1/8 PVC DRAIN (DRAIN) IMPLANT
EVACUATOR SILICONE 100CC (DRAIN) IMPLANT
GAUZE SPONGE 4X4 12PLY STRL (GAUZE/BANDAGES/DRESSINGS) ×2 IMPLANT
GAUZE XEROFORM 1X8 LF (GAUZE/BANDAGES/DRESSINGS) ×4 IMPLANT
GLOVE BIO SURGEON STRL SZ 6.5 (GLOVE) ×4 IMPLANT
GLOVE BIO SURGEON STRL SZ7.5 (GLOVE) ×4 IMPLANT
GLOVE BIO SURGEON STRL SZ8 (GLOVE) ×4 IMPLANT
GLOVE BIO SURGEONS STRL SZ 6.5 (GLOVE) ×2
GLOVE BIOGEL PI IND STRL 6.5 (GLOVE) ×2 IMPLANT
GLOVE BIOGEL PI IND STRL 7.0 (GLOVE) ×3 IMPLANT
GLOVE BIOGEL PI IND STRL 7.5 (GLOVE) ×2 IMPLANT
GLOVE BIOGEL PI IND STRL 8 (GLOVE) ×2 IMPLANT
GLOVE BIOGEL PI INDICATOR 6.5 (GLOVE) ×4
GLOVE BIOGEL PI INDICATOR 7.0 (GLOVE) ×6
GLOVE BIOGEL PI INDICATOR 7.5 (GLOVE) ×2
GLOVE BIOGEL PI INDICATOR 8 (GLOVE) ×2
GLOVE SS BIOGEL STRL SZ 7.5 (GLOVE) ×2 IMPLANT
GLOVE SUPERSENSE BIOGEL SZ 7.5 (GLOVE) ×2
GOWN STRL REUS W/ TWL LRG LVL3 (GOWN DISPOSABLE) ×10 IMPLANT
GOWN STRL REUS W/ TWL XL LVL3 (GOWN DISPOSABLE) ×1 IMPLANT
GOWN STRL REUS W/TWL LRG LVL3 (GOWN DISPOSABLE) ×20
GOWN STRL REUS W/TWL XL LVL3 (GOWN DISPOSABLE)
HALF PIN 5.0X160 (PIN) ×12 IMPLANT
HANDPIECE INTERPULSE COAX TIP (DISPOSABLE)
INSERT FOGARTY SM (MISCELLANEOUS) IMPLANT
KIT BASIN OR (CUSTOM PROCEDURE TRAY) ×5 IMPLANT
KIT ROOM TURNOVER OR (KITS) ×8 IMPLANT
MANIFOLD NEPTUNE II (INSTRUMENTS) ×4 IMPLANT
NEEDLE 22X1 1/2 (OR ONLY) (NEEDLE) IMPLANT
NS IRRIG 1000ML POUR BTL (IV SOLUTION) ×12 IMPLANT
PACK ORTHO EXTREMITY (CUSTOM PROCEDURE TRAY) ×1 IMPLANT
PACK PERIPHERAL VASCULAR (CUSTOM PROCEDURE TRAY) ×4 IMPLANT
PAD ARMBOARD 7.5X6 YLW CONV (MISCELLANEOUS) ×10 IMPLANT
PADDING CAST COTTON 6X4 STRL (CAST SUPPLIES) ×3 IMPLANT
PIN CLAMP 2BAR 75MM BLUE (PIN) ×6 IMPLANT
SET COLLECT BLD 21X3/4 12 (NEEDLE) IMPLANT
SET HNDPC FAN SPRY TIP SCT (DISPOSABLE) IMPLANT
SPONGE LAP 18X18 X RAY DECT (DISPOSABLE) ×4 IMPLANT
SPONGE SCRUB IODOPHOR (GAUZE/BANDAGES/DRESSINGS) ×1 IMPLANT
STAPLER VISISTAT 35W (STAPLE) ×4 IMPLANT
STOCKINETTE IMPERVIOUS LG (DRAPES) ×1 IMPLANT
STOPCOCK 4 WAY LG BORE MALE ST (IV SETS) IMPLANT
STRIP CLOSURE SKIN 1/2X4 (GAUZE/BANDAGES/DRESSINGS) ×3 IMPLANT
SUCTION FRAZIER TIP 10 FR DISP (SUCTIONS) IMPLANT
SUT ETHILON 3 0 PS 1 (SUTURE) IMPLANT
SUT PROLENE 3 0 PS 2 (SUTURE) IMPLANT
SUT PROLENE 5 0 C 1 24 (SUTURE) ×4 IMPLANT
SUT PROLENE 6 0 CC (SUTURE) ×4 IMPLANT
SUT SILK 2 0 SH (SUTURE) ×4 IMPLANT
SUT VIC AB 0 CT1 27 (SUTURE) ×4
SUT VIC AB 0 CT1 27XBRD ANBCTR (SUTURE) ×3 IMPLANT
SUT VIC AB 2-0 CT1 27 (SUTURE) ×8
SUT VIC AB 2-0 CT1 TAPERPNT 27 (SUTURE) ×4 IMPLANT
SUT VIC AB 2-0 CT3 27 (SUTURE) IMPLANT
SUT VIC AB 2-0 CTX 36 (SUTURE) ×8 IMPLANT
SUT VIC AB 3-0 SH 27 (SUTURE) ×8
SUT VIC AB 3-0 SH 27X BRD (SUTURE) ×4 IMPLANT
SUT VICRYL 4-0 PS2 18IN ABS (SUTURE) ×3 IMPLANT
SYR CONTROL 10ML LL (SYRINGE) IMPLANT
SYRINGE 20CC LL (MISCELLANEOUS) ×3 IMPLANT
TOWEL OR 17X24 6PK STRL BLUE (TOWEL DISPOSABLE) ×16 IMPLANT
TOWEL OR 17X26 10 PK STRL BLUE (TOWEL DISPOSABLE) ×25 IMPLANT
TRAY FOLEY CATH 16FRSI W/METER (SET/KITS/TRAYS/PACK) ×4 IMPLANT
TUBE CONNECTING 12'X1/4 (SUCTIONS)
TUBE CONNECTING 12X1/4 (SUCTIONS) ×1 IMPLANT
TUBING EXTENTION W/L.L. (IV SETS) IMPLANT
UNDERPAD 30X30 INCONTINENT (UNDERPADS AND DIAPERS) ×8 IMPLANT
WATER STERILE IRR 1000ML POUR (IV SOLUTION) ×12 IMPLANT
YANKAUER SUCT BULB TIP NO VENT (SUCTIONS) ×1 IMPLANT

## 2013-10-06 NOTE — OR Nursing (Signed)
No signed consent due to emergency. Dr. Marcelino Scot obtained verbal consent in the Operating Room to place an external fixation device on the right leg and Dr. Donnetta Hutching to perform a femoral-popliteal bypass on the right leg. Patient gave verbal consent to proceed with procedure.
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
## 2013-10-06 NOTE — Op Note (Signed)
[name], [name] NO.:  [id number]  MEDICAL RECORD NO.:  [id number]  LOCATION:  3M13C                        FACILITY:  [facility]  PHYSICIAN:  Astrid Divine. Marcelino Scot, M.D. DATE OF BIRTH:  [birth year]  DATE OF PROCEDURE:  10/06/2013 DATE OF DISCHARGE:                              OPERATIVE REPORT   PREOPERATIVE DIAGNOSES: 1. Right tibial plateau fracture. 2. Vascular injury. 3. Compartment syndrome.  POSTOPERATIVE DIAGNOSES: 1. Right tibial plateau fracture. 2. Vascular injury. 3. Compartment syndrome.  PROCEDURES:  Panel 1: 1. Closed reduction, right tibial plateau fracture. 2. Application of spanning external fixator, right leg. 3. Anterior and lateral compartment fasciotomies. 4. Placement of large wound VAC. Panel 2: 1. Right femoral popliteal artery bypass graft. 2. Deep and superficial posterior compartment releases.  SURGEON: 1. Panel 1:  Astrid Divine. Marcelino Scot, MD. 2. Panel 2:  Rosetta Posner, MD.  ANESTHESIA:  General, Glynda Jaeger, MD.  At the time of this dictation, record is incomplete, but 400 mL crystalloid and 350 mL of packed cells, out 200 mL.  SPECIMENS:  None.  TOURNIQUET:  None.  DISPOSITION:  The patient remained in the OR under the care of Dr. Donnetta Hutching.  BRIEF SUMMARY OF INDICATION OF PROCEDURE:  Bruce [name] is a 68 year old male who was working in an open cab, [employer] conservation [employer] when a tree swung through the opening and pinned his right leg.  The injury reportedly happened about 12:45 p.m. and patient was transported emergently here after extrication.  He denied motor function or sensation distally.  I was contacted by the Trauma Service and arranged for the patient be taken emergently to the OR, as he had a pulseless extremity with a neurovascular compromise, compartment syndrome, and severely displaced fracture.  I also discussed the patient's presentation, coordinated a treatment plan with Dr. Sherren Mocha [name] from Vascular Service.   I then spoke with Mr. Bruce [name] and as he was in transport from CT angio where Dr. Donnetta Hutching requested evaluation, up to and into the OR room.  I explained to him the risks and benefits, with reduction and provisional fixation using a fixator including the possibility of pin tract infection, other nerve or vessel injury, the need to return to the OR, most likely several times heart attack, stroke, other anesthetic complications, the possibility of fracture would not heal, possibility of future vascular disruption and many others.  The patient acknowledged these risk and did wish [place] to proceed. Furthermore, I did also discuss the need for emergent consent with doctors, Dr. Darreld [name]. [name] and Dr. Linna [name], all of which of course urged [place] to proceed without delay.  BRIEF SUMMARY OF PROCEDURE:  Patient received 2 g of Ancef preoperatively, was taken to the operating room, and transported to the operative table where general anesthesia was induced.  His right leg then underwent general reduction maneuver with traction followed by shaving and other prep of the extremity including the groin if Dr. Donnetta Hutching needed to access that as well.  The right femur then underwent placement of 2 pins followed by the right tibia clamps were engaged after orthogonal views with C-arm confirmed appropriate pin placement trajectory and length.  A closed reduction  maneuver was performed with the knee in slight flexion and bumps under the knee.  This reduced the fracture quite nicely.  There remained some slight posterior translation.  The lateral aspect of the knee was completely internally degloved and all the bony prominences were readily palpable through the subcutaneous tissues.  Attention was then turned distally after securing the bars in place.  Directly over the fibular head, at distal fibula, a large 24-cm incision was made.  Dissection was carried down to the lateral fascia and the skin flap was elevated anteriorly  over the septum.  A longitudinal cut with a knife was made on either side of the septum and the Metzenbaum scissors used with the tips curved away from the superficial peroneal nerve to split both proximally and distally.  The muscle appeared pink underneath, but was not contractile and did not show a blanching techniques characteristic of vascularized tissue rather remained same color.  A large wound VAC was quickly applied with a 4 cm x 24 cm expanse and then engaged.  At that point, Dr. Donnetta Hutching took over.  The Orthopedic portions of the procedure, after prep took 30 minutes.  I then stayed to observe the findings from Dr. Donnetta Hutching before exiting the OR at the time of his vascular repair of the popliteal artery.  Please refer to his dictation for complete account.  PROGNOSIS:  I am very concerned about the ultimate capacity for Mr. Bruce [name] to regain functional use of his extremity.  At this time, it appears not only that his popliteal nerve, but his peroneal as well is not going to be functional.  Of course, there is opportunity for recovery, but the prognosis is generally poor.  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  Fortunately, his vein was intact and we were able to get his compartments released within the 4 hour interval and his repair at approximately 5 hours.  Consequently from a vascular perspective, the potential is there.  My other concern is the biomechanical stability of his knee, particularly the lateral side where it seems that all structures have been avulsed and this is likely associated with his peroneal injury.  We will see if the vascular repair is successful, if it is not I will recommend an above knee amputation. This will be discussed with his family and the patient.     Astrid Divine. Marcelino Scot, M.D.     MHH/MEDQ  D:  10/06/2013  T:  10/06/2013  Job:  [id number]

## 2013-10-06 NOTE — Anesthesia Postprocedure Evaluation (Signed)
  Anesthesia Post-op Note  Patient: Bruce [name]  Procedure(s) Performed: Procedure(s): CLOSED REDUCTION RIGHT TIBIAL PLATEAU FRACTURE, EXTERNAL FIXATION RIGHT LEG, PLACEMENT OF WOUND VAC (Right) RIGHT POPLITEAL-POPLITEAL ARTERY BYPASS GRAFT (Right)  Patient Location: PACU  Anesthesia Type:General  Level of Consciousness: awake  Airway and Oxygen Therapy: Patient Spontanous Breathing  Post-op Pain: mild  Post-op Assessment: Post-op Vital signs reviewed  Post-op Vital Signs: Reviewed  Last Vitals:  Filed Vitals:   10/06/13 1910  BP:   Pulse:   Temp:   Resp: 16    Complications: No apparent anesthesia complications

## 2013-10-06 NOTE — Anesthesia Procedure Notes (Signed)
Procedure Name: Intubation Date/Time: 10/06/2013 3:26 PM Performed by: Octavio Graves Pre-anesthesia Checklist: Patient identified, Timeout performed, Emergency Drugs available, Suction available and Patient being monitored Patient Re-evaluated:Patient Re-evaluated prior to inductionOxygen Delivery Method: Circle system utilized Preoxygenation: Pre-oxygenation with 100% oxygen Intubation Type: IV induction Ventilation: Mask ventilation without difficulty Laryngoscope Size: Miller and 2 Grade View: Grade I Tube type: Oral Tube size: 7.5 mm Number of attempts: 1 Airway Equipment and Method: Stylet Placement Confirmation: ETT inserted through vocal cords under direct vision,  positive ETCO2 and breath sounds checked- equal and bilateral Secured at: 22 cm Tube secured with: Tape Dental Injury: Teeth and Oropharynx as per pre-operative assessment  Comments: Pt with C collar intact- c spine clear per Linna Caprice- Joslin discussed with Esperanza Sheets notifies Joslin that C Spine is clear and Wyatt removes cervical collar- neck maintained in neutral alignment for intubation - Pt with very poor dentition- few loose bottom teeth and pt removed upper dentures prior to the induction- intubation atraumatic teeth and mouth as preop- + Bilat BS Joslin- + ETCO2

## 2013-10-06 NOTE — Transfer of Care (Signed)
Immediate Anesthesia Transfer of Care Note  Patient: Bruce [name]  Procedure(s) Performed: Procedure(s): CLOSED REDUCTION RIGHT TIBIAL PLATEAU FRACTURE, EXTERNAL FIXATION RIGHT LEG, PLACEMENT OF WOUND VAC (Right) RIGHT POPLITEAL-POPLITEAL ARTERY BYPASS GRAFT (Right)  Patient Location: PACU  Anesthesia Type:General  Level of Consciousness: awake and alert   Airway & Oxygen Therapy: Patient Spontanous Breathing and Patient connected to nasal cannula oxygen  Post-op Assessment: Report given to PACU RN and Post -op Vital signs reviewed and stable  Post vital signs: Reviewed and stable  Complications: No apparent anesthesia complications

## 2013-10-06 NOTE — ED Notes (Signed)
Per EMS: Patient presents via EMS with a chief complaint of right lower leg deformity, swelling, and absent pedal pulses after a tree fell on right lower extremity this afternoon. Patient alert and orientedx3, hypotensive, absent PT and DP to right lower extremity.

## 2013-10-06 NOTE — Consult Note (Signed)
Orthopaedic Trauma Service Consultation  Reason for Consult: Dysvascular limb s/p tibial plateau fracture right leg Referring Physician: Doreen Salvage, MD, Trauma Service  Bruce [name] is an 68 y.o. male.  HPI: Patient was using an open cab bushhog or excavator when a tree swing through the opening and pinned his right leg.  Patient denies other injury.  Time of accident reported at 12:45pm.  Denies sensation of foot.  Pain severe, throbbing at knee. No distal pulses palpable or dopplerable.  Obvious shortening of the extremity.  Taken emergently from CT angio into OR.  I spoke with patient during transport to OR room.  Dr. Donnetta Hutching also at bedside in [facility] and CT.  PMH: MI x 2, HTN, blood clot in leg (?side); denies DM  No past surgical history on file.  No family history on file.  Social History:  has no tobacco, alcohol, and drug history on file.  Allergies: No Known Allergies  Medications: Xarelto; took no meds today  Results for orders placed during the hospital encounter of 10/06/13 (from the past 48 hour(s))  PREPARE FRESH FROZEN PLASMA     Status: None   Collection Time    10/06/13  1:47 PM      Result Value Ref Range   Unit Number [id number]     Blood Component Type LIQ PLASMA     Unit division 00     Status of Unit ISSUED     Unit tag comment VERBAL ORDERS PER DR STEINL     Transfusion Status OK TO TRANSFUSE     Unit Number [id number]     Blood Component Type LIQ PLASMA     Unit division 00     Status of Unit ISSUED     Unit tag comment VERBAL ORDERS PER DR STEINL     Transfusion Status OK TO TRANSFUSE     Unit Number [id number]     Blood Component Type THAWED PLASMA     Unit division 00     Status of Unit ALLOCATED     Transfusion Status OK TO TRANSFUSE    TYPE AND SCREEN     Status: None   Collection Time    10/06/13  3:55 PM      Result Value Ref Range   ABO/RH(D) B POS     Antibody Screen NEG     Sample Expiration 10/09/2013     Unit Number  [id number]     Blood Component Type RBC LR PHER1     Unit division 00     Status of Unit ISSUED     Unit tag comment VERBAL ORDERS PER DR STEINL     Transfusion Status OK TO TRANSFUSE     Crossmatch Result PENDING     Unit Number [id number]     Blood Component Type RED CELLS,LR     Unit division 00     Status of Unit ISSUED     Unit tag comment VERBAL ORDERS PER DR STEINL     Transfusion Status OK TO TRANSFUSE     Crossmatch Result PENDING     Unit Number [id number]     Blood Component Type RED CELLS,LR     Unit division 00     Status of Unit ISSUED     Transfusion Status OK TO TRANSFUSE     Crossmatch Result Compatible     Unit Number [id number]     Blood Component Type RED CELLS,LR     Unit division 00  Status of Unit ISSUED     Transfusion Status OK TO TRANSFUSE     Crossmatch Result Compatible    ABO/RH     Status: None   Collection Time    10/06/13  3:55 PM      Result Value Ref Range   ABO/RH(D) B POS    PREPARE RBC (CROSSMATCH)     Status: None   Collection Time    10/06/13  4:20 PM      Result Value Ref Range   Order Confirmation ORDER PROCESSED BY BLOOD BANK      Ct Head Wo Contrast  10/06/2013   CLINICAL DATA:  Trauma.  EXAM: CT HEAD WITHOUT CONTRAST  CT CERVICAL SPINE WITHOUT CONTRAST  TECHNIQUE: Multidetector CT imaging of the head and cervical spine was performed following the standard protocol without intravenous contrast. Multiplanar CT image reconstructions of the cervical spine were also generated.  COMPARISON:  None.  FINDINGS: CT HEAD FINDINGS  No skull fracture or intracranial hemorrhage.  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  Fat associated with choroid without evidence of intracranial mass lesion noted on this unenhanced exam.  No hydrocephalus.  Asymmetric appearance of the nasal bones with lucency involving the larger left nasal bone. If the patient is tender here, acute fracture not excluded  although I suspect this is remote in appearance.  Partial opacification ethmoid sinus air cells. Visualized orbital structures unremarkable.  CT CERVICAL SPINE FINDINGS  No cervical spine fracture or malalignment. No abnormal prevertebral soft tissue swelling.  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  If ligamentous injury or cord injury were of high clinical concern, MR imaging could be obtained for further delineation.  Apical lung parenchymal changes may represent scarring however, 6.2 mm nodular appearance left lung apex. Given risk factors for bronchogenic carcinoma, follow-up chest CT at 6 - 12 months is recommended. This recommendation follows the consensus statement: Guidelines for Management of Small Pulmonary Nodules Detected on CT Scans: A Statement from the [place] as published in Radiology 2005;237:395-400.  IMPRESSION: CT HEAD  No skull fracture or intracranial hemorrhage.  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  Asymmetric appearance of the nasal bones with lucency involving the larger left nasal bone. If the patient is tender here, acute fracture not excluded although I suspect this is remote in appearance.  Partial opacification ethmoid sinus air cells.  CT CERVICAL SPINE  No cervical spine fracture or malalignment. No abnormal prevertebral soft tissue swelling.  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  6.2 mm nodule left lung apex. Given risk factors for bronchogenic carcinoma, follow-up chest CT at 6 - 12 months is recommended. This recommendation follows the consensus statement: Guidelines for Management of Small Pulmonary Nodules Detected on CT Scans: A Statement from the [place] as published in Radiology 2005;237:395-400.   Electronically Signed   By: Chauncey Cruel M.D.    On: 10/06/2013 15:33   Ct Cervical Spine Wo Contrast  10/06/2013   CLINICAL DATA:  Trauma.  EXAM: CT HEAD WITHOUT CONTRAST  CT CERVICAL SPINE WITHOUT CONTRAST  TECHNIQUE: Multidetector CT imaging of the head and cervical spine was performed following the standard protocol without intravenous contrast. Multiplanar CT image reconstructions of the cervical spine were also generated.  COMPARISON:  None.  FINDINGS: CT HEAD FINDINGS  No skull  fracture or intracranial hemorrhage.  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  Fat associated with choroid without evidence of intracranial mass lesion noted on this unenhanced exam.  No hydrocephalus.  Asymmetric appearance of the nasal bones with lucency involving the larger left nasal bone. If the patient is tender here, acute fracture not excluded although I suspect this is remote in appearance.  Partial opacification ethmoid sinus air cells. Visualized orbital structures unremarkable.  CT CERVICAL SPINE FINDINGS  No cervical spine fracture or malalignment. No abnormal prevertebral soft tissue swelling.  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  If ligamentous injury or cord injury were of high clinical concern, MR imaging could be obtained for further delineation.  Apical lung parenchymal changes may represent scarring however, 6.2 mm nodular appearance left lung apex. Given risk factors for bronchogenic carcinoma, follow-up chest CT at 6 - 12 months is recommended. This recommendation follows the consensus statement: Guidelines for Management of Small Pulmonary Nodules Detected on CT Scans: A Statement from the [place] as published in Radiology 2005;237:395-400.  IMPRESSION: CT HEAD  No skull fracture or intracranial hemorrhage.  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  Asymmetric appearance of the nasal  bones with lucency involving the larger left nasal bone. If the patient is tender here, acute fracture not excluded although I suspect this is remote in appearance.  Partial opacification ethmoid sinus air cells.  CT CERVICAL SPINE  No cervical spine fracture or malalignment. No abnormal prevertebral soft tissue swelling.  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  6.2 mm nodule left lung apex. Given risk factors for bronchogenic carcinoma, follow-up chest CT at 6 - 12 months is recommended. This recommendation follows the consensus statement: Guidelines for Management of Small Pulmonary Nodules Detected on CT Scans: A Statement from the [place] as published in Radiology 2005;237:395-400.   Electronically Signed   By: Chauncey Cruel M.D.   On: 10/06/2013 15:33   Ct Angio Ao+bifem W/cm &/or Wo/cm  10/06/2013   ADDENDUM REPORT: 10/06/2013 16:04  ADDENDUM: Critical Value/emergent results were called by telephone at the time of interpretation on 10/06/2013 at 4:04 pm to Dr. Silvestre Gunner , who verbally acknowledged these results.   Electronically Signed   By: Markus Daft M.D.   On: 10/06/2013 16:04   10/06/2013   CLINICAL DATA:  Trauma to the right lower extremity. Patient complains of right lower extremity pain and pulseless right foot.  EXAM: CT ANGIOGRAPHY OF ABDOMINAL AORTA WITH ILIOFEMORAL RUNOFF  TECHNIQUE: Multidetector CT imaging of the abdomen, pelvis and lower extremities was performed using the standard protocol during bolus administration of intravenous contrast. Multiplanar CT image reconstructions and MIPs were obtained to evaluate the vascular anatomy.  CONTRAST:  100 mL Omnipaque 350  COMPARISON:  Right knee radiographs 10/06/2013  FINDINGS: Aorta: The abdominal aorta is patent without aneurysm or dissection. Mild atherosclerotic disease in the abdominal aorta. Celiac trunk and main branches are patent. Narrowing of  the proximal celiac trunk is most likely related to the median arcuate ligament. The superior mesenteric artery is widely patent. Bilateral renal arteries are widely patent. The inferior mesenteric artery is patent.  Right Lower Extremity: The right common, internal and external iliac arteries are patent with mild atherosclerotic disease. The right common femoral artery is patent. The proximal right profunda femoral arteries  are patent. The right superficial femoral artery is patent. 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 There is no flow in the distal popliteal artery or right calf arteries. There is a comminuted and displaced fracture of the proximal tibia with lateral displacement. Fracture involves the tibial plateau. The displaced tibial fracture is situated along the expected course of the right popliteal artery. There is diffuse swelling with blood products in the distal thigh and knee. The fibula appears to be intact. No definite fracture of the distal femur.  Left Lower Extremity: The left common, internal and external iliac arteries are patent. The left common femoral artery is patent. The left profunda femoral arteries are patent. Left superficial femoral artery is patent. Left popliteal artery is patent. There is 3 vessel runoff in the proximal left calf. Limited evaluation of the arterial structures in the mid calf.  Other findings: Lung bases are clear. No evidence for free intra-abdominal air. No gross abnormality to the liver. The gallbladder has been removed. Evaluation of the intra-abdominal structures limited due to the arterial phase of contrast. Normal appearance of the adrenal glands, pancreas and spleen. There are large stones in the left kidney mid pole. Largest stone measures up to 2.0 cm. Normal appearance of the right kidney. No significant free fluid or lymphadenopathy. No gross abnormality to the prostate or urinary bladder. There is prominent fat at the ileocecal  valve. Cannot exclude mild small colonic lipomas. Normal appearance of the appendix and small bowel. No acute bone abnormality in the pelvis or visualized spine. Degenerate facet disease in the lumbar spine.  Review of the MIP images confirms the above findings.  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 This is a traumatic arterial injury related to the displaced proximal tibial fracture. No evidence for contrast extravasation or active bleeding.  Narrowing of the celiac trunk is most likely related to the median arcuate ligament.  Nonobstructive left renal calculi.  Electronically Signed: By: Markus Daft M.D. On: 10/06/2013 15:57   Dg Pelvis Portable  10/06/2013   CLINICAL DATA:  Pain post trauma  EXAM: PORTABLE PELVIS 1-2 VIEWS  COMPARISON:  None.  FINDINGS: Note that there is overlying artifact making evaluation of portions of the pelvis less than optimal. There is no evidence of pelvic fracture or dislocation. Joint spaces appear intact. No erosive change.  IMPRESSION: No abnormality noted. Note that there are artifacts overlying portions of the pelvis making this evaluations somewhat less than optimal.   Electronically Signed   By: Lowella Grip M.D.   On: 10/06/2013 14:39   Dg Chest Port 1 View  10/06/2013   CLINICAL DATA:  Trauma.  EXAM: PORTABLE CHEST - 1 VIEW  COMPARISON:  None.  FINDINGS: Incomplete examination in that the lung apices are not included on present examination. Taking this limitation into account, no gross pneumothorax is detected.  Mediastinum appears prominent. This may be related to AP magnification although mediastinal injury cannot be excluded by plain film exam.  Heart size within normal limits.  Central pulmonary vascular prominence without pulmonary edema.  Mild degenerative changes thoracic spine. No obvious burst fracture. No obvious rib fracture.  IMPRESSION: Incomplete examination in that  the lung apices are not included on present examination. Taking this limitation into account, no gross pneumothorax is detected.  Mediastinum appears prominent. This may be related to AP magnification although mediastinal injury cannot be excluded by plain film exam.  Electronically Signed   By: Chauncey Cruel M.D.   On: 10/06/2013 14:41   Dg Knee Right Port  10/06/2013   CLINICAL DATA:  Trauma.  EXAM: PORTABLE RIGHT KNEE - 1-2 VIEW  COMPARISON:  None.  FINDINGS: Single frontal radiograph of the knee. Comminuted depressed tibial plateau fracture with impaction. No destructive bony lesions. Limited assessment for dislocation. Patient is on a trauma board.  IMPRESSION: Limited frontal radiograph of the knee demonstrating comminuted impacted tibial plateau fracture.   Electronically Signed   By: Elon Alas   On: 10/06/2013 14:40    ROS emergent, unable to attain other than above Blood pressure 117/83, pulse 68, temperature 97.8 F (36.6 C), temperature source Oral, resp. rate 23, SpO2 100.00%. Physical Exam A&Ox4, conversant but anxious C collar RRR CTA RLE Traumatic abrasion and ecchymosis, deep tissue internal degloving posterolaterally, severe shortening and deformity with posterior translation of the tibia, no open deep wounds  Tender  Knee hemarthrosis  No sens DPN, SPN, TN  No motor EHL, ext, flex, evers 5/5  DP and PT nonpalpable and nondopplerable LLE No traumatic wounds, ecchymosis, or rash  Nontender  No effusions  Knee stable to varus/ valgus and anterior/posterior stress  Sens DPN, SPN, TN intact  Motor EHL, ext, flex, evers 5/5  DP 2+, PT 2+, No significant edema  Assessment/Plan: Right bicondylar tibial plateau fracture displaced at trifurcation and associated vascular injury 1. Emergent external fixation and closed reduction to provide stability and facilitate vascular repair 2. Four compartment fasciotomies 3. Wound vac 4. Return to OR for second look in two to  three days for reassessment. Have d/w and coordinated plan with Drs. Early and Serita Grit, MD [facility] [phone] (p)   10/06/2013  4:55 PM

## 2013-10-06 NOTE — Op Note (Signed)
OPERATIVE REPORT  DATE OF SURGERY: 10/06/2013  PATIENT: Bruce [name], 68 y.o. male MRN: [id number]  DOB: 10-14-1945  PRE-OPERATIVE DIAGNOSIS: Severe ischemia right leg associated with popliteal artery injury and tib-fib fracture  POST-OPERATIVE DIAGNOSIS:  Same  PROCEDURE: Exploration popliteal artery, repair of popliteal artery transection with end-to-end reverse saphenous vein interposition graft  SURGEON:  Curt Jews, M.D.  PHYSICIAN ASSISTANT: Samantha Rhyne PA-C  ANESTHESIA:  Gen.  EBL: Estimated total 1 L for the case ml  Total I/O In: 3420 [I.V.:2400; Blood:1020] Out: 385 [Urine:385]  BLOOD ADMINISTERED: Packed blood cells 2 units  DRAINS: VAC dressings to medial and lateral calf wounds  SPECIMEN: None  COUNTS CORRECT:  YES  PLAN OF CARE: PACU   PATIENT DISPOSITION:  PACU - hemodynamically stable  PROCEDURE DETAILS: The patient suffered severe trauma to his right knee with a logging accident today. Found to have no motor or sensory function in his foot and severe ischemia with no audible Doppler flow in his right foot. X-rays revealed severe tib-fib coming the comminuted fracture. The second medial to the operating room for repair. Initially he had external fixator placement by Dr. Altamese Normal which we dictated as a separate note. Continue to have ischemia despite reduction of the severe displacement. He had been prepped and draped for revascularization. SonoSite ultrasound revealed the level of the stat saphenous vein at the medial aspect of his thigh and calf. He had an incision made over the medial aspect of his calf and the saphenous vein was protected at this level. There was a great deal of hematoma in the popliteal space the fascia was opened down to above the level of the ankle. The popliteal space was entered bluntly. There was no popliteal pulse pulse in the below-knee position. On the feeling further proximally there was a hematoma in this area and the  popliteal pulse could be felt. The gastrocnemius and soleus muscles were retracted posteriorly or. It was clear that the popliteal artery had been transected and retracted. The artery was identified proximally and distally and was occluded with vascular clamps. There was a branch of vein was transected and this was ligated with ligaclips. It did appear that the popliteal vein was intact and patent. The nerve appeared to be intact as well. The popliteal artery was dissected further proximally and distally back to nontraumatized artery. The artery was transected and further proximally and further distally and was spatulated in the artery above and below the area of traumatic transection was of good caliber with no evidence of involvement of the trifurcation distally. There was good backbleeding and good for bleeding. The patient was given 6000 units of intravenous heparin. There was no evidence of clot in the distal artery. A separate incision was made over the medial thigh over the area of the saphenous vein was identified the SonoSite ultrasound. The vein was harvested over a segment of approximately 4-5 cm. The artery was ligated proximally and distally with 2-0 silk ties and divided. The vein was gently dilated and was of excellent caliber for bypass. The vein was spatulated and reversed and was sewn end to end to the proximal artery behind the knee with a running 6-0 Prolene suture. This anastomosis was tested and found to be adequate and there was excellent flow through the vein graft. The vein graft was brought down to the level of the distal popliteal artery which was exposed and the same incision. The vein was spatulated and was sewn end to end  to the distal popliteal artery above the trifurcation. Heart to completion of the anastomosis the usual flushing maneuvers were undertaken. Anastomosis was completed and closed or to the foot. Excellent biphasic Doppler flow was noted in the dorsalis pedis and  posterior tibial arteries. The popliteal incision was irrigated and hemostasis tablet cautery. There was some oozing from the tibial fracture sites. A Vac dressing was placed on the medial calf has had been placed on the lateral fasciotomy. The vein harvest incision was closed with 3-0 Vicryl in the subcutaneous tissue and 4-0 Vicryl suture in the subcuticular tissue. Sterile dressing was applied and the patient was taken to the recovery room in critical condition   Curt Jews, M.D. 10/06/2013 6:03 PM
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
## 2013-10-06 NOTE — Procedures (Signed)
Focused abdominal sonogram for trauma (FAST)  No evidence of intra-abdominal fluid or blood.    Epigastric   RUQ   LUQ   Pelvic  Kathryne Eriksson. Dahlia Bailiff, MD, [place] [phone] Trauma Surgeon

## 2013-10-06 NOTE — OR Nursing (Signed)
C-Collar removed by Dr. Hulen Skains in operating room

## 2013-10-06 NOTE — Anesthesia Preprocedure Evaluation (Addendum)
Anesthesia Evaluation  Patient identified by MRN, date of birth, ID band Patient awake    Reviewed: Allergy & Precautions, NPO status   History of Anesthesia Complications Negative for: history of anesthetic complications  Airway Mallampati: II TM Distance: >3 FB Neck ROM: Limited    Dental  (+) Dental Advisory Given, Loose, Missing, Edentulous Upper   Pulmonary Current Smoker,          Cardiovascular hypertension, + angina + Past MI  Hx of blood clot to leg   Neuro/Psych    GI/Hepatic Neg liver ROS,   Endo/Other  negative endocrine ROS  Renal/GU negative Renal ROS     Musculoskeletal  (+) Arthritis -,   Abdominal   Peds  Hematology  (+) anemia ,   Anesthesia Other Findings   Reproductive/Obstetrics                          Anesthesia Physical Anesthesia Plan  ASA: III and emergent  Anesthesia Plan: General   Post-op Pain Management:    Induction: Intravenous  Airway Management Planned: Oral ETT  Additional Equipment: Arterial line  Intra-op Plan:   Post-operative Plan: Extubation in OR  Informed Consent:   Dental advisory given  Plan Discussed with: CRNA, Anesthesiologist and Surgeon  Anesthesia Plan Comments:         Anesthesia Quick Evaluation

## 2013-10-06 NOTE — Brief Op Note (Signed)
10/06/2013  5:14 PM  PATIENT:  Bruce [name]  68 y.o. male  PRE-OPERATIVE DIAGNOSIS:  Right Leg Tibial Plateau Fracture, Vascular Injury, Compartment Syndrome  POST-OPERATIVE DIAGNOSIS:  Right Leg Tibial Plateau Fracture, Vascular Injury, Compartment Syndrome  PROCEDURE:  Procedure(s): PANEL 1: 1. CLOSED REDUCTION RIGHT TIBIAL PLATEAU FRACTURE 2. APPLICATION OF SPANNING EXTERNAL FIXATION RIGHT LEG 3. ANTERIOR AND LATERAL FASCIOTOMIES 4. PLACEMENT OF LARGE WOUND VAC (Right) PANEL 2: 5. RIGHT FEMORAL-POPLITEAL ARTERY BYPASS GRAFT (Right) 6. DEEP AND SUPERFICIAL POSTERIOR COMPARTMENT RELEASES  SURGEON:  Surgeon(s) and Role: Panel 1:    * Rozanna Box, MD - Primary Panel 2:    * Rosetta Posner, MD - Primary  ANESTHESIA:   general  I/O:  Total I/O In: 750 [I.V.:400; Blood:350] Out: -   SPECIMEN:  No Specimen  TOURNIQUET:  * No tourniquets in log *  DICTATION: .Other Dictation: Dictation Number [id number]

## 2013-10-06 NOTE — OR Nursing (Signed)
Patient stated he had no known allergies, had something to eat and drink this morning, and confirmed we are operating on the right leg

## 2013-10-06 NOTE — Progress Notes (Signed)
Orthopedic Tech Progress Note Patient Details:  Bruce [name] 1945/09/18 [id number]  Patient ID: Bruce [name], male   DOB: 1945/11/29, 68 y.o.   MRN: [id number]   Bruce [name] 10/06/2013, 2:42 PMTrauma

## 2013-10-06 NOTE — Progress Notes (Signed)
Patient's blood pressure steadily declining. Hypotensive with systolic in the 27X. Dr. Hulen Skains notified. 500 cc  NS bolus and CBC ordered. Thayer Ohm D
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
## 2013-10-06 NOTE — Progress Notes (Signed)
Responded to level 1 to provide support to patient Per EMS: presents via EMS with a chief complaint of right lower leg deformity, patient is alert and talking. Accompanied doctor to speak with patient son in consultation room.  Patient going to CT for scan and them to O.R for surgery.  Surgery expected to be around two to three hours per doctor.  Promoted information sharing between staff and family. Provided hospitality, presence and listening.Will follow as needed.  10/06/13 1400  Clinical Encounter Type  Visited With Patient;Family;Health care provider  Visit Type Spiritual support;Pre-op;ED;Trauma  Referral From Nurse  Spiritual Encounters  Spiritual Needs Emotional  Stress Factors  Family Stress Factors None identified  Jaclynn Major, [place]

## 2013-10-07 DIAGNOSIS — R110 Nausea: Secondary | ICD-10-CM

## 2013-10-07 DIAGNOSIS — M79A29 Nontraumatic compartment syndrome of unspecified lower extremity: Secondary | ICD-10-CM

## 2013-10-07 DIAGNOSIS — S82209A Unspecified fracture of shaft of unspecified tibia, initial encounter for closed fracture: Secondary | ICD-10-CM

## 2013-10-07 LAB — BASIC METABOLIC PANEL
ANION GAP: 10 (ref 5–15)
BUN: 18 mg/dL (ref 6–23)
CALCIUM: 7.4 mg/dL — AB (ref 8.4–10.5)
CO2: 21 mEq/L (ref 19–32)
Chloride: 108 mEq/L (ref 96–112)
Creatinine, Ser: 1.17 mg/dL (ref 0.50–1.35)
GFR, EST AFRICAN AMERICAN: 73 mL/min — AB (ref >90)
GFR, EST NON AFRICAN AMERICAN: 63 mL/min — AB (ref >90)
Glucose, Bld: 143 mg/dL — ABNORMAL HIGH (ref 70–99)
Potassium: 4.7 mEq/L (ref 3.7–5.3)
SODIUM: 139 meq/L (ref 137–147)

## 2013-10-07 LAB — CBC
HCT: 29.5 % — ABNORMAL LOW (ref 39.0–52.0)
Hemoglobin: 10.2 g/dL — ABNORMAL LOW (ref 13.0–17.0)
MCH: 31.5 pg (ref 26.0–34.0)
MCHC: 34.6 g/dL (ref 30.0–36.0)
MCV: 91 fL (ref 78.0–100.0)
PLATELETS: 155 10*3/uL (ref 150–400)
RBC: 3.24 MIL/uL — ABNORMAL LOW (ref 4.22–5.81)
RDW: 14 % (ref 11.5–15.5)
WBC: 10.1 10*3/uL (ref 4.0–10.5)

## 2013-10-07 MED ORDER — IPRATROPIUM-ALBUTEROL 0.5-2.5 (3) MG/3ML IN SOLN
3.0000 mL | Freq: Four times a day (QID) | RESPIRATORY_TRACT | Status: DC | PRN
  Administered 2013-10-08 – 2013-10-09 (×2): 3 mL via RESPIRATORY_TRACT
  Filled 2013-10-07 (×2): qty 3

## 2013-10-07 MED ORDER — ALUM & MAG HYDROXIDE-SIMETH 200-200-20 MG/5ML PO SUSP
30.0000 mL | ORAL | Status: DC | PRN
  Administered 2013-10-07 – 2013-10-11 (×5): 30 mL via ORAL
  Filled 2013-10-07 (×5): qty 30

## 2013-10-07 MED ORDER — PROCHLORPERAZINE EDISYLATE 5 MG/ML IJ SOLN
10.0000 mg | Freq: Four times a day (QID) | INTRAMUSCULAR | Status: DC | PRN
  Administered 2013-10-08: 10 mg via INTRAVENOUS
  Filled 2013-10-07: qty 2

## 2013-10-07 MED ORDER — CEFAZOLIN SODIUM 1-5 GM-% IV SOLN
1.0000 g | Freq: Three times a day (TID) | INTRAVENOUS | Status: DC
Start: 2013-10-07 — End: 2013-10-09
  Administered 2013-10-07 – 2013-10-09 (×7): 1 g via INTRAVENOUS
  Filled 2013-10-07 (×8): qty 50
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
## 2013-10-07 NOTE — ED Provider Notes (Signed)
I saw and evaluated the patient, reviewed the resident's note and I agree with the findings and plan.  Pt presents via ems post severe blunt trauma to right leg in region knee from large tree branch. Constant, mod-severe pain to area.   Trauma code activated pta.  Pt with no palpable or dopplerable pulses in right leg - fem pulse 2+, no popliteal or dp/pt, significant swelling to right thigh, knee and lower leg.  Pt able to move toes, right foot cooler and duskier in appearance than left. Pt on anticoagulant medication. Skin intact.   Portable xrays, labs. Ortho and vascular surgery consulted - anticipate pt being taken emergently to address vascular injury and possible compartment syndrome.  Results for orders placed during the hospital encounter of 10/06/13  MRSA PCR SCREENING      Result Value Ref Range   MRSA by PCR NEGATIVE  NEGATIVE  APTT      Result Value Ref Range   aPTT 21 (*) 24 - 37 seconds  CBC      Result Value Ref Range   WBC 21.9 (*) 4.0 - 10.5 K/uL   RBC 2.97 (*) 4.22 - 5.81 MIL/uL   Hemoglobin 9.7 (*) 13.0 - 17.0 g/dL   HCT 28.0 (*) 39.0 - 52.0 %   MCV 94.3  78.0 - 100.0 fL   MCH 32.7  26.0 - 34.0 pg   MCHC 34.6  30.0 - 36.0 g/dL   RDW 12.9  11.5 - 15.5 %   Platelets 193  150 - 400 K/uL  COMPREHENSIVE METABOLIC PANEL      Result Value Ref Range   Sodium 140  137 - 147 mEq/L   Potassium 4.3  3.7 - 5.3 mEq/L   Chloride 106  96 - 112 mEq/L   CO2 22  19 - 32 mEq/L   Glucose, Bld 137 (*) 70 - 99 mg/dL   BUN 14  6 - 23 mg/dL   Creatinine, Ser 1.29  0.50 - 1.35 mg/dL   Calcium 7.6 (*) 8.4 - 10.5 mg/dL   Total Protein 4.5 (*) 6.0 - 8.3 g/dL   Albumin 2.6 (*) 3.5 - 5.2 g/dL   AST 32  0 - 37 U/L   ALT 21  0 - 53 U/L   Alkaline Phosphatase 59  39 - 117 U/L   Total Bilirubin 0.3  0.3 - 1.2 mg/dL   GFR calc non Af Amer 56 (*) >90 mL/min   GFR calc Af Amer 65 (*) >90 mL/min   Anion gap 12  5 - 15  PROTIME-INR      Result Value Ref Range   Prothrombin Time 16.0 (*)  11.6 - 15.2 seconds   INR 1.28  0.00 - 1.49  CBC      Result Value Ref Range   WBC 10.1  4.0 - 10.5 K/uL   RBC 3.24 (*) 4.22 - 5.81 MIL/uL   Hemoglobin 10.2 (*) 13.0 - 17.0 g/dL   HCT 29.5 (*) 39.0 - 52.0 %   MCV 91.0  78.0 - 100.0 fL   MCH 31.5  26.0 - 34.0 pg   MCHC 34.6  30.0 - 36.0 g/dL   RDW 14.0  11.5 - 15.5 %   Platelets 155  150 - 400 K/uL  BASIC METABOLIC PANEL      Result Value Ref Range   Sodium 139  137 - 147 mEq/L   Potassium 4.7  3.7 - 5.3 mEq/L   Chloride 108  96 - 112  mEq/L   CO2 21  19 - 32 mEq/L   Glucose, Bld 143 (*) 70 - 99 mg/dL   BUN 18  6 - 23 mg/dL   Creatinine, Ser 1.17  0.50 - 1.35 mg/dL   Calcium 7.4 (*) 8.4 - 10.5 mg/dL   GFR calc non Af Amer 63 (*) >90 mL/min   GFR calc Af Amer 73 (*) >90 mL/min   Anion gap 10  5 - 15  CBC      Result Value Ref Range   WBC 11.6 (*) 4.0 - 10.5 K/uL   RBC 3.49 (*) 4.22 - 5.81 MIL/uL   Hemoglobin 10.9 (*) 13.0 - 17.0 g/dL   HCT 31.9 (*) 39.0 - 52.0 %   MCV 91.4  78.0 - 100.0 fL   MCH 31.2  26.0 - 34.0 pg   MCHC 34.2  30.0 - 36.0 g/dL   RDW 13.8  11.5 - 15.5 %   Platelets 163  150 - 400 K/uL  POCT I-STAT 7, (LYTES, BLD GAS, ICA,H+H)      Result Value Ref Range   pH, Arterial 7.299 (*) 7.350 - 7.450   pCO2 arterial 49.0 (*) 35.0 - 45.0 mmHg   pO2, Arterial 299.0 (*) 80.0 - 100.0 mmHg   Bicarbonate 24.4 (*) 20.0 - 24.0 mEq/L   TCO2 26  0 - 100 mmol/L   O2 Saturation 100.0     Acid-base deficit 2.0  0.0 - 2.0 mmol/L   Sodium 138  137 - 147 mEq/L   Potassium 3.9  3.7 - 5.3 mEq/L   Calcium, Ion 1.19  1.13 - 1.30 mmol/L   HCT 27.0 (*) 39.0 - 52.0 %   Hemoglobin 9.2 (*) 13.0 - 17.0 g/dL   Patient temperature 35.8 C     Sample type ARTERIAL    PREPARE FRESH FROZEN PLASMA      Result Value Ref Range   Unit Number [id number]     Blood Component Type LIQ PLASMA     Unit division 00     Status of Unit REL FROM [facility]     Unit tag comment VERBAL ORDERS PER DR Lawrance Wiedemann     Transfusion Status OK TO TRANSFUSE      Unit Number [id number]     Blood Component Type LIQ PLASMA     Unit division 00     Status of Unit REL FROM [facility]     Unit tag comment VERBAL ORDERS PER DR Sharronda Schweers     Transfusion Status OK TO TRANSFUSE     Unit Number [id number]     Blood Component Type THAWED PLASMA     Unit division 00     Status of Unit ALLOCATED     Transfusion Status OK TO TRANSFUSE    TYPE AND SCREEN      Result Value Ref Range   ABO/RH(D) B POS     Antibody Screen NEG     Sample Expiration 10/09/2013     Unit Number [id number]     Blood Component Type RBC LR PHER1     Unit division 00     Status of Unit REL FROM [facility]     Unit tag comment VERBAL ORDERS PER DR Avett Reineck     Transfusion Status OK TO TRANSFUSE     Crossmatch Result NOT NEEDED     Unit Number [id number]     Blood Component Type RED CELLS,LR     Unit division 00     Status of Unit  REL FROM [facility]     Unit tag comment VERBAL ORDERS PER DR Caroly Purewal     Transfusion Status OK TO TRANSFUSE     Crossmatch Result NOT NEEDED     Unit Number [id number]     Blood Component Type RED CELLS,LR     Unit division 00     Status of Unit ISSUED     Transfusion Status OK TO TRANSFUSE     Crossmatch Result Compatible     Unit Number [id number]     Blood Component Type RED CELLS,LR     Unit division 00     Status of Unit ISSUED     Transfusion Status OK TO TRANSFUSE     Crossmatch Result Compatible    PREPARE RBC (CROSSMATCH)      Result Value Ref Range   Order Confirmation ORDER PROCESSED BY BLOOD BANK    ABO/RH      Result Value Ref Range   ABO/RH(D) B POS     Ct Head Wo Contrast  10/06/2013   CLINICAL DATA:  Trauma.  EXAM: CT HEAD WITHOUT CONTRAST  CT CERVICAL SPINE WITHOUT CONTRAST  TECHNIQUE: Multidetector CT imaging of the head and cervical spine was performed following the standard protocol without intravenous contrast. Multiplanar CT image reconstructions of the cervical spine were also generated.  COMPARISON:  None.  FINDINGS: CT  HEAD FINDINGS  No skull fracture or intracranial hemorrhage.  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  Fat associated with choroid without evidence of intracranial mass lesion noted on this unenhanced exam.  No hydrocephalus.  Asymmetric appearance of the nasal bones with lucency involving the larger left nasal bone. If the patient is tender here, acute fracture not excluded although I suspect this is remote in appearance.  Partial opacification ethmoid sinus air cells. Visualized orbital structures unremarkable.  CT CERVICAL SPINE FINDINGS  No cervical spine fracture or malalignment. No abnormal prevertebral soft tissue swelling.  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  If ligamentous injury or cord injury were of high clinical concern, MR imaging could be obtained for further delineation.  Apical lung parenchymal changes may represent scarring however, 6.2 mm nodular appearance left lung apex. Given risk factors for bronchogenic carcinoma, follow-up chest CT at 6 - 12 months is recommended. This recommendation follows the consensus statement: Guidelines for Management of Small Pulmonary Nodules Detected on CT Scans: A Statement from the [place] as published in Radiology 2005;237:395-400.  IMPRESSION: CT HEAD  No skull fracture or intracranial hemorrhage.  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  Asymmetric appearance of the nasal bones with lucency involving the larger left nasal bone. If the patient is tender here, acute fracture not excluded although I suspect this is remote in appearance.  Partial opacification ethmoid sinus air cells.  CT CERVICAL SPINE  No cervical spine fracture or malalignment. No abnormal prevertebral soft tissue swelling.  Cervical spondylotic changes with spinal stenosis, cord flattening and foraminal narrowing  most prominent C5-6 and C6-7.  6.2 mm nodule left lung apex. Given risk factors for bronchogenic carcinoma, follow-up chest CT at 6 - 12 months is recommended. This recommendation follows the consensus statement: Guidelines for Management of Small Pulmonary Nodules Detected on CT Scans: A Statement from the [place] as published in Radiology 2005;237:395-400.   Electronically Signed   By: Chauncey Cruel M.D.   On: 10/06/2013  15:33   Ct Cervical Spine Wo Contrast  10/06/2013   CLINICAL DATA:  Trauma.  EXAM: CT HEAD WITHOUT CONTRAST  CT CERVICAL SPINE WITHOUT CONTRAST  TECHNIQUE: Multidetector CT imaging of the head and cervical spine was performed following the standard protocol without intravenous contrast. Multiplanar CT image reconstructions of the cervical spine were also generated.  COMPARISON:  None.  FINDINGS: CT HEAD FINDINGS  No skull fracture or intracranial hemorrhage.  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  Fat associated with choroid without evidence of intracranial mass lesion noted on this unenhanced exam.  No hydrocephalus.  Asymmetric appearance of the nasal bones with lucency involving the larger left nasal bone. If the patient is tender here, acute fracture not excluded although I suspect this is remote in appearance.  Partial opacification ethmoid sinus air cells. Visualized orbital structures unremarkable.  CT CERVICAL SPINE FINDINGS  No cervical spine fracture or malalignment. No abnormal prevertebral soft tissue swelling.  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  If ligamentous injury or cord injury were of high clinical concern, MR imaging could be obtained for further delineation.  Apical lung parenchymal changes may represent scarring however, 6.2 mm nodular appearance left lung apex. Given risk factors for bronchogenic carcinoma, follow-up chest CT at 6 - 12  months is recommended. This recommendation follows the consensus statement: Guidelines for Management of Small Pulmonary Nodules Detected on CT Scans: A Statement from the [place] as published in Radiology 2005;237:395-400.  IMPRESSION: CT HEAD  No skull fracture or intracranial hemorrhage.  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  Asymmetric appearance of the nasal bones with lucency involving the larger left nasal bone. If the patient is tender here, acute fracture not excluded although I suspect this is remote in appearance.  Partial opacification ethmoid sinus air cells.  CT CERVICAL SPINE  No cervical spine fracture or malalignment. No abnormal prevertebral soft tissue swelling.  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  6.2 mm nodule left lung apex. Given risk factors for bronchogenic carcinoma, follow-up chest CT at 6 - 12 months is recommended. This recommendation follows the consensus statement: Guidelines for Management of Small Pulmonary Nodules Detected on CT Scans: A Statement from the [place] as published in Radiology 2005;237:395-400.   Electronically Signed   By: Chauncey Cruel M.D.   On: 10/06/2013 15:33   Ct Angio Ao+bifem W/cm &/or Wo/cm  10/06/2013   ADDENDUM REPORT: 10/06/2013 16:04  ADDENDUM: Critical Value/emergent results were called by telephone at the time of interpretation on 10/06/2013 at 4:04 pm to Dr. Silvestre Gunner , who verbally acknowledged these results.   Electronically Signed   By: Markus Daft M.D.   On: 10/06/2013 16:04   10/06/2013   CLINICAL DATA:  Trauma to the right lower extremity. Patient complains of right lower extremity pain and pulseless right foot.  EXAM: CT ANGIOGRAPHY OF ABDOMINAL AORTA WITH ILIOFEMORAL RUNOFF  TECHNIQUE: Multidetector CT imaging of the abdomen, pelvis and lower extremities was performed using the standard  protocol during bolus administration of intravenous contrast. Multiplanar CT image reconstructions and MIPs were obtained to evaluate the vascular anatomy.  CONTRAST:  100 mL Omnipaque 350  COMPARISON:  Right knee radiographs 10/06/2013  FINDINGS: Aorta: The abdominal aorta is patent without aneurysm or dissection. Mild atherosclerotic disease in the abdominal aorta. Celiac  trunk and main branches are patent. Narrowing of the proximal celiac trunk is most likely related to the median arcuate ligament. The superior mesenteric artery is widely patent. Bilateral renal arteries are widely patent. The inferior mesenteric artery is patent.  Right Lower Extremity: The right common, internal and external iliac arteries are patent with mild atherosclerotic disease. The right common femoral artery is patent. The proximal right profunda femoral arteries are patent. The right superficial femoral artery is patent. 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 There is no flow in the distal popliteal artery or right calf arteries. There is a comminuted and displaced fracture of the proximal tibia with lateral displacement. Fracture involves the tibial plateau. The displaced tibial fracture is situated along the expected course of the right popliteal artery. There is diffuse swelling with blood products in the distal thigh and knee. The fibula appears to be intact. No definite fracture of the distal femur.  Left Lower Extremity: The left common, internal and external iliac arteries are patent. The left common femoral artery is patent. The left profunda femoral arteries are patent. Left superficial femoral artery is patent. Left popliteal artery is patent. There is 3 vessel runoff in the proximal left calf. Limited evaluation of the arterial structures in the mid calf.  Other findings: Lung bases are clear. No evidence for free intra-abdominal air. No gross abnormality to the liver. The gallbladder has been removed.  Evaluation of the intra-abdominal structures limited due to the arterial phase of contrast. Normal appearance of the adrenal glands, pancreas and spleen. There are large stones in the left kidney mid pole. Largest stone measures up to 2.0 cm. Normal appearance of the right kidney. No significant free fluid or lymphadenopathy. No gross abnormality to the prostate or urinary bladder. There is prominent fat at the ileocecal valve. Cannot exclude mild small colonic lipomas. Normal appearance of the appendix and small bowel. No acute bone abnormality in the pelvis or visualized spine. Degenerate facet disease in the lumbar spine.  Review of the MIP images confirms the above findings.  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 This is a traumatic arterial injury related to the displaced proximal tibial fracture. No evidence for contrast extravasation or active bleeding.  Narrowing of the celiac trunk is most likely related to the median arcuate ligament.  Nonobstructive left renal calculi.  Electronically Signed: By: Markus Daft M.D. On: 10/06/2013 15:57   Dg Pelvis Portable  10/06/2013   CLINICAL DATA:  Pain post trauma  EXAM: PORTABLE PELVIS 1-2 VIEWS  COMPARISON:  None.  FINDINGS: Note that there is overlying artifact making evaluation of portions of the pelvis less than optimal. There is no evidence of pelvic fracture or dislocation. Joint spaces appear intact. No erosive change.  IMPRESSION: No abnormality noted. Note that there are artifacts overlying portions of the pelvis making this evaluations somewhat less than optimal.   Electronically Signed   By: Lowella Grip M.D.   On: 10/06/2013 14:39   Dg Chest Port 1 View  10/06/2013   CLINICAL DATA:  Trauma.  EXAM: PORTABLE CHEST - 1 VIEW  COMPARISON:  None.  FINDINGS: Incomplete examination in that the lung apices are not included on present examination. Taking this limitation  into account, no gross pneumothorax is detected.  Mediastinum appears prominent. This may be related to AP magnification although mediastinal injury cannot be excluded by plain film exam.  Heart size  within normal limits.  Central pulmonary vascular prominence without pulmonary edema.  Mild degenerative changes thoracic spine. No obvious burst fracture. No obvious rib fracture.  IMPRESSION: Incomplete examination in that the lung apices are not included on present examination. Taking this limitation into account, no gross pneumothorax is detected.  Mediastinum appears prominent. This may be related to AP magnification although mediastinal injury cannot be excluded by plain film exam.   Electronically Signed   By: Chauncey Cruel M.D.   On: 10/06/2013 14:41   Dg Knee Right Port  10/06/2013   CLINICAL DATA:  Trauma.  EXAM: PORTABLE RIGHT KNEE - 1-2 VIEW  COMPARISON:  None.  FINDINGS: Single frontal radiograph of the knee. Comminuted depressed tibial plateau fracture with impaction. No destructive bony lesions. Limited assessment for dislocation. Patient is on a trauma board.  IMPRESSION: Limited frontal radiograph of the knee demonstrating comminuted impacted tibial plateau fracture.   Electronically Signed   By: Elon Alas   On: 10/06/2013 14:40   Dg C-arm 1-60 Min  10/06/2013   CLINICAL DATA:  Intraoperative.  EXAM: DG C-ARM 61-120 MIN  TECHNIQUE: Single lateral intraoperative fluoroscopic spot view of the right knee, radiologist was not present.  FLUOROSCOPY TIME:  .1 (unit of time not provided)  COMPARISON:  Right knee radiograph October 06, 2013 at 1415 hr  FINDINGS: Tibial plateau fracture.  IMPRESSION: Single lateral fluoroscopic spot view demonstrating tibial plateau fracture.   Electronically Signed   By: Elon Alas   On: 10/06/2013 18:00   Dg Knee 2 Views Right  10/06/2013   CLINICAL DATA:  Comminuted, depressed tibial plateau fracture.  EXAM: RIGHT KNEE - 3 VIEW  COMPARISON:  Earlier today.   FINDINGS: Previously demonstrated comminuted tibial plateau fracture. Significantly improved position and alignment. Currently, there is depression of the lateral tibial plateau.  IMPRESSION: Significantly improved position and alignment of a tibial plateau fracture with depression of the lateral tibial plateau currently demonstrated.   Electronically Signed   By: Enrique Sack M.D.   On: 10/06/2013 19:40    CRITICAL CARE  RE severe blunt trauma to RLE, acute popliteal a/vascular injury, comminuted/depressed tibial plateau fracture, r/o compartment syndrome  Performed by: Mirna Mires Total critical care time: 35 Critical care time was exclusive of separately billable procedures and treating other patients. Critical care was necessary to treat or prevent imminent or life-threatening deterioration. Critical care was time spent personally by me on the following activities: development of treatment plan with patient and/or surrogate as well as nursing, discussions with consultants, evaluation of patient's response to treatment, examination of patient, obtaining history from patient or surrogate, ordering and performing treatments and interventions, ordering and review of laboratory studies, ordering and review of radiographic studies, pulse oximetry and re-evaluation of patient's condition.      Mirna Mires, MD 10/07/13 [phone]

## 2013-10-07 NOTE — Progress Notes (Signed)
Patient ID: Bruce [name], male   DOB: 06-12-1945, 68 y.o.   MRN: [id number] 1 Day Post-Op  Subjective: Nauseated, hypotension overnight  Objective: Vital signs in last 24 hours: Temp:  [97.3 F (36.3 C)-98.9 F (37.2 C)] 98.9 F (37.2 C) (08/04 0400) Pulse Rate:  [66-96] 90 (08/04 0800) Resp:  [12-30] 16 (08/04 0800) BP: (80-128)/(41-91) 96/56 mmHg (08/04 0800) SpO2:  [92 %-100 %] 95 % (08/04 0800) Arterial Line BP: (85-128)/(43-70) 123/47 mmHg (08/04 0800) Weight:  [233 lb 0.4 oz (105.7 kg)] 233 lb 0.4 oz (105.7 kg) (08/03 2000)    Intake/Output from previous day: 08/03 0701 - 08/04 0700 In: 8435 [P.O.:250; I.V.:6650; Blood:1035; IV [id number]:027] Out: 2510 [Urine:1010; Drains:850; Blood:650] Intake/Output this shift:    General appearance: alert and cooperative Resp: clear to auscultation bilaterally Chest wall: no tenderness Cardio: regular rate and rhythm GI: soft, NT, ND Extremities: ex fix RLE, VAC on fasciotomy sites, LT sens intact, moves toes  Lab Results: CBC   Recent Labs  10/06/13 2257 10/07/13 0500  WBC 11.6* 10.1  HGB 10.9* 10.2*  HCT 31.9* 29.5*  PLT 163 155   BMET  Recent Labs  10/06/13 1700 10/07/13 0500  NA 140 139  K 4.3 4.7  CL 106 108  CO2 22 21  GLUCOSE 137* 143*  BUN 14 18  CREATININE 1.29 1.17  CALCIUM 7.6* 7.4*   PT/INR  Recent Labs  10/06/13 1700  LABPROT 16.0*  INR 1.28   ABG  Recent Labs  10/06/13 1603  PHART 7.299*  HCO3 24.4*    Anti-infectives: Anti-infectives   None      Assessment/Plan: Log vs knee R popliteal artery injury - S/P repair by Dr. Donnetta Hutching, good doppler signal R tibial plateau FX/compartment syndrome - S/P ex fix by Dr. Marcelino Scot, will return for ORIF, S/P  fasciotomies Nausea - add compazine, try clears if better later ABL anemia - mild VTE - Lovenox Dispo - ICU for recent hypotension    LOS: 1 day    Georganna Skeans, MD, MPH, FACS Trauma: [phone] General Surgery:  [phone]  10/07/2013

## 2013-10-07 NOTE — Progress Notes (Addendum)
Occupational Therapy Evaluation Patient Details Name: Bruce [name] MRN: [id number] DOB: [birth year] Today's Date: 10/07/2013    History of Present Illness Patient is a 68 yo male with Severe ischemia right leg associated with popliteal artery injury and tib-fib fracture, now s/p CLOSED REDUCTION RIGHT TIBIAL PLATEAU FRACTURE, EXTERNAL FIXATION RIGHT LEG, PLACEMENT OF WOUND VAC (Right).   Clinical Impression   PTA, pt lived alone and was independent with ADL and mobility. Pt tolerated session well and is very motivated to D/C home. Feel pt will be able to D/C home @ w/c level initially with 24/7 S of family. Rec pt follow up with [facility]. Pt states family can build temporary ramp and that he will have 24/7 assistance. Very supportive family. Pt will benefit from skilled OT services to facilitate D/C home due to below deficits. Will fabricate footstrap.     Follow Up Recommendations  Home health OT;Supervision/Assistance - 24 hour    Equipment Recommendations  3 in 1 bedside comode;Tub/shower bench;Wheelchair (measurements OT);Wheelchair cushion (measurements OT)  - needs elevating leg rests   Recommendations for Other Services       Precautions / Restrictions Precautions Precautions: Fall Required Braces or Orthoses:  (Wound Vac, external fixator RLE) Restrictions Weight Bearing Restrictions: Yes RLE Weight Bearing: Non weight bearing      Mobility Bed Mobility Overal bed mobility: Needs Assistance;+2 for physical assistance Bed Mobility: Supine to Sit;Sit to Supine     Supine to sit: Mod assist;+2 for physical assistance Sit to supine: Mod assist   General bed mobility comments: asistance for moving RLE and transitioning trunk forward. Transfers Overall transfer level: Needs assistance   Transfers: Sit to/from Stand;Stand Pivot Transfers Sit to Stand: Min assist;From elevated surface Stand pivot transfers: Mod assist;+2 safety/equipment;From elevated surface       General  transfer comment: transfer to strong side    Balance Overall balance assessment: Needs assistance Sitting-balance support: Feet supported;Bilateral upper extremity supported Sitting balance-Leahy Scale: Fair     Standing balance support: During functional activity Standing balance-Leahy Scale: Poor                              ADL Overall ADL's : Needs assistance/impaired     Grooming: Set up;Sitting   Upper Body Bathing: Set up;Sitting   Lower Body Bathing: Moderate assistance;Sit to/from stand   Upper Body Dressing : Set up;Supervision/safety   Lower Body Dressing: Moderate assistance;Sit to/from stand   Toilet Transfer: Moderate assistance;Stand-pivot (simulated with recliner)   Toileting- Clothing Manipulation and Hygiene: Minimal assistance       Functional mobility during ADLs: Moderate assistance;+2 for safety/equipment General ADL Comments: Limited by pain/external fixator     Vision                     Perception     Praxis      Pertinent Vitals/Pain Pain 6/10 during movement. Premedicated HR 133 during mobility - nsg aware     Hand Dominance Right   Extremity/Trunk Assessment Upper Extremity Assessment Upper Extremity Assessment: Overall WFL for tasks assessed   Lower Extremity Assessment Lower Extremity Assessment: Defer to PT evaluation RLE Deficits / Details: crush injury RLE: Unable to fully assess due to pain;Unable to fully assess due to immobilization RLE Sensation: decreased light touch   Cervical / Trunk Assessment Cervical / Trunk Assessment: Normal   Communication Communication Communication: No difficulties   Cognition Arousal/Alertness: Awake/alert Behavior During Therapy:  WFL for tasks assessed/performed Overall Cognitive Status: Within Functional Limits for tasks assessed                     General Comments       Exercises       Shoulder Instructions      Home Living Family/patient  expects to be discharged to:: Private residence Living Arrangements: Alone Available Help at Discharge: Family Type of Home: House Home Access: Stairs to enter [employer] of Steps: 3 Entrance Stairs-Rails: Can reach both Home Layout: One level     Bathroom Shower/Tub: Tub/shower unit;Walk-in shower   Bathroom Toilet: Standard     Home Equipment: None          Prior Functioning/Environment Level of Independence: Independent             OT Diagnosis: Generalized weakness;Acute pain   [facility], OTR/L  [phone] Problem List: Decreased strength;Decreased range of motion;Decreased activity tolerance;Impaired balance (sitting and/or standing);Decreased knowledge of use of DME or AE;Decreased knowledge of precautions;Pain   OT Treatment/Interventions: Self-care/ADL training;DME and/or AE instruction;Therapeutic activities;Patient/family education;Balance training    OT Goals(Current goals can be found in the care plan section) Acute Rehab OT Goals Patient Stated Goal: to go home OT Goal Formulation: With patient Time For Goal Achievement: 10/21/13 Potential to Achieve Goals: Good  OT Frequency: Min 2X/week   Barriers to D/C:            Co-evaluation              End of Session Equipment Utilized During Treatment: Gait belt;Oxygen;Other (comment) (ext fixator) Nurse Communication: Mobility status;Precautions  Activity Tolerance: Patient tolerated treatment well Patient left: in chair;with call bell/phone within reach;with family/visitor present   Time: 2505-3976 OT Time Calculation (min): 33 min Charges:  OT General Charges $OT Visit: 1 Procedure OT Evaluation $Initial OT Evaluation Tier I: 1 Procedure OT Treatments $Self Care/Home Management : 8-22 mins G-Codes:    Undrea Archbold,HILLARY 2013/10/28, 3:50 PM

## 2013-10-07 NOTE — Progress Notes (Signed)
Occupational Therapy Treatment Patient Details Name: Bruce [name] MRN: [id number] DOB: [birth year] Today's Date: 10/07/2013    History of present illness Patient is a 68 yo male with Severe ischemia right leg associated with popliteal artery injury and tib-fib fracture, now s/p CLOSED REDUCTION RIGHT TIBIAL PLATEAU FRACTURE, EXTERNAL FIXATION RIGHT LEG, PLACEMENT OF WOUND VAC (Right).   OT comments  Foot strap made for RLE. Educated pt and nurse on foot strap. Explained to pt reason and benefit of wearing it. Will re-check fit tomorrow.  Follow Up Recommendations  Home health OT;Supervision/Assistance - 24 hour    Equipment Recommendations  3 in 1 bedside comode;Tub/shower bench;Wheelchair (measurements OT);Wheelchair cushion (measurements OT)    Recommendations for Other Services      Precautions / Restrictions Precautions Precautions: Fall Required Braces or Orthoses:  (wound vac, external fixator RLE) Restrictions Weight Bearing Restrictions: Yes RLE Weight Bearing: Non weight bearing        Vision                     Perception     Praxis      Cognition   Behavior During Therapy: WFL for tasks assessed/performed Overall Cognitive Status: Within Functional Limits for tasks assessed                         Exercises     Shoulder Instructions       General Comments      Pertinent Vitals/ Pain       Pain 6/10 RLE. Repositioned.   Home Living Family/patient expects to be discharged to:: Private residence Living Arrangements: Alone Available Help at Discharge: Family Type of Home: House Home Access: Stairs to enter [employer] of Steps: 3 Entrance Stairs-Rails: Can reach both Home Layout: One level     Bathroom Shower/Tub: Tub/shower unit;Walk-in shower   Bathroom Toilet: Standard     Home Equipment: None          Prior Functioning/Environment Level of Independence: Independent            Frequency Min 2X/week      Progress Toward Goals  OT Goals(current goals can now be found in the care plan section)     Acute Rehab OT Goals Patient Stated Goal: to go home OT Goal Formulation: With patient Time For Goal Achievement: 10/21/13 Potential to Achieve Goals: Good ADL Goals Pt Will Perform Lower Body Bathing: with min guard assist;with adaptive equipment;with caregiver independent in assisting;sitting/lateral leans;sit to/from stand Pt Will Perform Lower Body Dressing: with min guard assist;with adaptive equipment;sitting/lateral leans;sit to/from stand;with caregiver independent in assisting Pt Will Transfer to Toilet: with min guard assist;bedside commode (with caregiver assisting) Pt Will Perform Toileting - Clothing Manipulation and hygiene: with set-up;sitting/lateral leans Additional ADL Goal #1: Pt will tolerate foot strap on RLE.  Plan Discharge plan remains appropriate    Co-evaluation                 End of Session Equipment Utilized During Treatment: Gait belt;Oxygen; (ext fixator)   Activity Tolerance Patient tolerated treatment well   Patient Left in chair;with call bell/phone within reach   Nurse Communication Other (comment) (foot strap)        Time: 9485-4627 OT Time Calculation (min): 26 min  Charges: OT General Charges $OT Visit: 1 Procedure OT Treatments $Therapeutic Activity: 8-22 mins $Orthotics Fit/Training: 8-22 mins  Benito Mccreedy OTR/L [phone] 10/07/2013, 6:18 PM

## 2013-10-07 NOTE — Progress Notes (Addendum)
  [facility] Progress Note  10/07/2013 7:37 AM 1 Day Post-Op  Subjective:  C/o nausea while eating ice; states his foot is feeling better than yesterday and is able to move his toes.  afebrile 85'Y-85'O systolic HR 27'X-412 regular 95% 2LO2NC  Filed Vitals:   10/07/13 0700  BP: 92/53  Pulse: 86  Temp:   Resp: 15    Physical Exam: Incisions:  Right vein harvest site with clean bandage in place; wound vacs to lateral and medial RLE with good seal Extremities:  + palpable right DP; + doppler signal right AT; sensation not in tact on tip of toes, but is in tact throughout the rest of the foot.  Pt is able to wiggles his toes.  CBC    Component Value Date/Time   WBC 10.1 10/07/2013 0500   RBC 3.24* 10/07/2013 0500   HGB 10.2* 10/07/2013 0500   HCT 29.5* 10/07/2013 0500   PLT 155 10/07/2013 0500   MCV 91.0 10/07/2013 0500   MCH 31.5 10/07/2013 0500   MCHC 34.6 10/07/2013 0500   RDW 14.0 10/07/2013 0500    BMET    Component Value Date/Time   NA 139 10/07/2013 0500   K 4.7 10/07/2013 0500   CL 108 10/07/2013 0500   CO2 21 10/07/2013 0500   GLUCOSE 143* 10/07/2013 0500   BUN 18 10/07/2013 0500   CREATININE 1.17 10/07/2013 0500   CALCIUM 7.4* 10/07/2013 0500   GFRNONAA 63* 10/07/2013 0500   GFRAA 73* 10/07/2013 0500    INR    Component Value Date/Time   INR 1.28 10/06/2013 1700     Intake/Output Summary (Last 24 hours) at 10/07/13 0737 Last data filed at 10/07/13 0700  Gross per 24 hour  Intake   8435 ml  Output   2510 ml  Net   5925 ml     Wound vac drainage:  850cc   Assessment:  68 y.o. male is s/p:  Exploration popliteal artery, repair of popliteal artery transection with end-to-end reverse saphenous vein interposition graft and  1. Closed reduction, right tibial plateau fracture.  2. Application of spanning external fixator, right leg.  3. Anterior and lateral compartment fasciotomies.  4. Placement of large wound VAC. 1 Day Post-Op  Plan: -pt doing well from a  vascular standpoint.  Given that he already has motor and sensation back, he should regain use of his foot.   -acute blood loss anemia-received 2U PRBC's -pt nauseated-may consider reglan if Zofran is not effective -DVT prophylaxis:  Lovenox to start today   Leontine Locket, PA-C [facility] [phone] 10/07/2013 7:37 AM    I have examined the patient, reviewed and agree with above. Palpable right posterior tibial pulse. Motor and sensory seems to be intact. Plan per ortho and trauma  EARLY, TODD, MD 10/07/2013 8:31 AM

## 2013-10-08 DIAGNOSIS — S8990XA Unspecified injury of unspecified lower leg, initial encounter: Secondary | ICD-10-CM

## 2013-10-08 DIAGNOSIS — T79A0XA Compartment syndrome, unspecified, initial encounter: Secondary | ICD-10-CM

## 2013-10-08 DIAGNOSIS — I10 Essential (primary) hypertension: Secondary | ICD-10-CM | POA: Insufficient documentation

## 2013-10-08 DIAGNOSIS — I252 Old myocardial infarction: Secondary | ICD-10-CM

## 2013-10-08 DIAGNOSIS — E785 Hyperlipidemia, unspecified: Secondary | ICD-10-CM | POA: Insufficient documentation

## 2013-10-08 DIAGNOSIS — F419 Anxiety disorder, unspecified: Secondary | ICD-10-CM | POA: Insufficient documentation

## 2013-10-08 DIAGNOSIS — Z7901 Long term (current) use of anticoagulants: Secondary | ICD-10-CM

## 2013-10-08 DIAGNOSIS — K219 Gastro-esophageal reflux disease without esophagitis: Secondary | ICD-10-CM | POA: Insufficient documentation

## 2013-10-08 DIAGNOSIS — D62 Acute posthemorrhagic anemia: Secondary | ICD-10-CM | POA: Diagnosis present

## 2013-10-08 HISTORY — DX: Old myocardial infarction: I25.2

## 2013-10-08 HISTORY — DX: Compartment syndrome, unspecified, initial encounter: T79.A0XA

## 2013-10-08 HISTORY — DX: Unspecified injury of unspecified lower leg, initial encounter: S89.90XA

## 2013-10-08 LAB — CBC
HCT: 25.9 % — ABNORMAL LOW (ref 39.0–52.0)
HEMATOCRIT: 29.5 % — AB (ref 39.0–52.0)
HEMOGLOBIN: 8.8 g/dL — AB (ref 13.0–17.0)
HEMOGLOBIN: 10.1 g/dL — AB (ref 13.0–17.0)
MCH: 32.6 pg (ref 26.0–34.0)
MCH: 32 pg (ref 26.0–34.0)
MCHC: 34 g/dL (ref 30.0–36.0)
MCHC: 34.2 g/dL (ref 30.0–36.0)
MCV: 95.9 fL (ref 78.0–100.0)
MCV: 93.4 fL (ref 78.0–100.0)
PLATELETS: 126 10*3/uL — AB (ref 150–400)
Platelets: 125 10*3/uL — ABNORMAL LOW (ref 150–400)
RBC: 2.7 MIL/uL — ABNORMAL LOW (ref 4.22–5.81)
RBC: 3.16 MIL/uL — ABNORMAL LOW (ref 4.22–5.81)
RDW: 14 % (ref 11.5–15.5)
RDW: 14.9 % (ref 11.5–15.5)
WBC: 8.4 10*3/uL (ref 4.0–10.5)
WBC: 8.9 10*3/uL (ref 4.0–10.5)

## 2013-10-08 LAB — BASIC METABOLIC PANEL
Anion gap: 11 (ref 5–15)
BUN: 14 mg/dL (ref 6–23)
CO2: 22 mEq/L (ref 19–32)
Calcium: 7.9 mg/dL — ABNORMAL LOW (ref 8.4–10.5)
Chloride: 104 mEq/L (ref 96–112)
Creatinine, Ser: 0.95 mg/dL (ref 0.50–1.35)
GFR, EST NON AFRICAN AMERICAN: 85 mL/min — AB (ref >90)
GLUCOSE: 114 mg/dL — AB (ref 70–99)
POTASSIUM: 4.9 meq/L (ref 3.7–5.3)
SODIUM: 137 meq/L (ref 137–147)

## 2013-10-08 LAB — PREPARE RBC (CROSSMATCH)

## 2013-10-08 LAB — BLOOD PRODUCT ORDER (VERBAL) VERIFICATION

## 2013-10-08 MED ORDER — SODIUM CHLORIDE 0.9 % IV SOLN
Freq: Once | INTRAVENOUS | Status: AC
  Administered 2013-10-08: 10:00:00 via INTRAVENOUS

## 2013-10-08 MED ORDER — LIDOCAINE-PRILOCAINE 2.5-2.5 % EX CREA
1.0000 "application " | TOPICAL_CREAM | Freq: Once | CUTANEOUS | Status: DC
  Filled 2013-10-08: qty 5

## 2013-10-08 MED ORDER — ACETAMINOPHEN 325 MG PO TABS
650.0000 mg | ORAL_TABLET | Freq: Once | ORAL | Status: AC
  Administered 2013-10-08: 650 mg via ORAL
  Filled 2013-10-08: qty 2

## 2013-10-08 MED ORDER — LORATADINE 10 MG PO TABS
10.0000 mg | ORAL_TABLET | Freq: Every day | ORAL | Status: DC
  Administered 2013-10-08 – 2013-10-11 (×4): 10 mg via ORAL
  Filled 2013-10-08 (×4): qty 1

## 2013-10-08 MED ORDER — OXYMETAZOLINE HCL 0.05 % NA SOLN
2.0000 | Freq: Once | NASAL | Status: AC
  Administered 2013-10-09: 2 via NASAL
  Filled 2013-10-08: qty 15

## 2013-10-08 MED ORDER — TRAMADOL HCL 50 MG PO TABS: 50.0000 mg | ORAL_TABLET | Freq: Four times a day (QID) | ORAL | Status: DC | PRN

## 2013-10-08 MED ORDER — FUROSEMIDE 10 MG/ML IJ SOLN
20.0000 mg | Freq: Once | INTRAMUSCULAR | Status: AC
  Administered 2013-10-08: 20 mg via INTRAVENOUS

## 2013-10-08 MED ORDER — NITROGLYCERIN 0.4 MG SL SUBL
0.4000 mg | SUBLINGUAL_TABLET | SUBLINGUAL | Status: DC
  Administered 2013-10-08 – 2013-10-10 (×2): 0.4 mg via SUBLINGUAL
  Filled 2013-10-08 (×2): qty 25
  Filled 2013-10-08 (×2): qty 1
  Filled 2013-10-08 (×2): qty 25

## 2013-10-08 MED ORDER — FUROSEMIDE 10 MG/ML IJ SOLN
INTRAMUSCULAR | Status: AC
  Filled 2013-10-08: qty 4

## 2013-10-08 MED ORDER — WHITE PETROLATUM GEL
Status: AC
  Administered 2013-10-08: 09:00:00
  Filled 2013-10-08: qty 5

## 2013-10-08 MED ORDER — FUROSEMIDE 10 MG/ML IJ SOLN
20.0000 mg | Freq: Once | INTRAMUSCULAR | Status: AC
  Administered 2013-10-08: 20 mg via INTRAVENOUS
  Filled 2013-10-08: qty 2

## 2013-10-08 MED ORDER — OXYMETAZOLINE HCL 0.05 % NA SOLN
2.0000 | Freq: Once | NASAL | Status: DC
  Filled 2013-10-08: qty 15

## 2013-10-08 MED ORDER — ATORVASTATIN CALCIUM 80 MG PO TABS
80.0000 mg | ORAL_TABLET | Freq: Every day | ORAL | Status: DC
  Administered 2013-10-08 – 2013-10-11 (×4): 80 mg via ORAL
  Filled 2013-10-08 (×4): qty 1

## 2013-10-08 MED ORDER — METOPROLOL SUCCINATE ER 50 MG PO TB24
50.0000 mg | ORAL_TABLET | Freq: Every day | ORAL | Status: DC
  Administered 2013-10-08 – 2013-10-10 (×3): 50 mg via ORAL
  Filled 2013-10-08 (×5): qty 1

## 2013-10-08 MED ORDER — ISOSORBIDE MONONITRATE ER 60 MG PO TB24
60.0000 mg | ORAL_TABLET | Freq: Every day | ORAL | Status: DC
  Administered 2013-10-08 – 2013-10-11 (×4): 60 mg via ORAL
  Filled 2013-10-08 (×4): qty 1

## 2013-10-08 MED ORDER — ALPRAZOLAM 0.5 MG PO TABS
1.0000 mg | ORAL_TABLET | Freq: Two times a day (BID) | ORAL | Status: DC | PRN
Start: 2013-10-08 — End: 2013-10-11

## 2013-10-08 MED ORDER — DIPHENHYDRAMINE HCL 25 MG PO CAPS
25.0000 mg | ORAL_CAPSULE | Freq: Once | ORAL | Status: AC
  Administered 2013-10-08: 25 mg via ORAL
  Filled 2013-10-08: qty 1

## 2013-10-08 NOTE — Progress Notes (Signed)
Pt has temperature of 102.8, paged Trauma MD, Dr. Marlou Starks, gave order for antipyretic. Will administer promptly and continue to follow up and monitor pt.   Bruce [name]

## 2013-10-08 NOTE — Progress Notes (Addendum)
     Subjective  - He is doing well no new complaints.   Objective 106/51 94 99.6 F (37.6 C) (Oral) 17 95%  Intake/Output Summary (Last 24 hours) at 10/08/13 0846 Last data filed at 10/08/13 0800  Gross per 24 hour  Intake   3490 ml  Output   1880 ml  Net   1610 ml   Wound vac in place, distal leg dressing clean and dry. Active range of motion of toes intact, skin warm to touch. Palpable DP right LE pulses   Assessment: POD #2 Exploration popliteal artery, repair of popliteal artery transection with end-to-end reverse saphenous vein interposition graft  and  1. Closed reduction, right tibial plateau fracture.  2. Application of spanning external fixator, right leg.  3. Anterior and lateral compartment fasciotomies.  4. Placement of large wound VAC.  Plan : Per Ortho Return to the OR tomorrow for second look and eval soft tissue +/- closure of fasciotomies and possible STSG  It will be about 2-3 weeks before we can consider fixation of his fractures and soft tissue structures  Will likely need CT and MRI of knee to eval injury   Anemia order for 2 units to be transfused DVT prophylasix Lovenox Ancef IV q 8   COLLINS, EMMA MAUREEN 10/08/2013 8:46 AM --  Laboratory Lab Results:  Recent Labs  10/07/13 0500 10/08/13 0434  WBC 10.1 8.4  HGB 10.2* 8.8*  HCT 29.5* 25.9*  PLT 155 126*   BMET  Recent Labs  10/07/13 0500 10/08/13 0240  NA 139 137  K 4.7 4.9  CL 108 104  CO2 21 22  GLUCOSE 143* 114*  BUN 18 14  CREATININE 1.17 0.95  CALCIUM 7.4* 7.9*    COAG Lab Results  Component Value Date   INR 1.28 10/06/2013   No results found for this basename: PTT      I have examined the patient, reviewed and agree with above.Neuro seems intact to foot.  Well perfused   Bruce Poellnitz, MD 10/08/2013 11:37 AM

## 2013-10-08 NOTE — Progress Notes (Signed)
[facility] Progress Note  Subjective  Doing much better No new issues  Objective   BP 106/51  Pulse 94  Temp(Src) 99.6 F (37.6 C) (Oral)  Resp 17  Ht 6\' 1"  (1.854 m)  Wt 105.7 kg (233 lb 0.4 oz)  BMI 30.75 kg/m2  SpO2 95%  Intake/Output     08/04 0701 - 08/05 0700 08/05 0701 - 08/06 0700   P.O. 940    I.V. (mL/kg) 2400 (22.7) 100 (0.9)   Blood     IV Piggyback 150    Total Intake(mL/kg) 3490 (33) 100 (0.9)   Urine (mL/kg/hr) 1645 (0.6)    Drains 410 (0.2)    Blood     Total Output 2055     Net +1435 +100          Labs  Results for KAYLOB, WALLEN (MRN [id number]) as of 10/08/2013 08:30  Ref. Range 10/08/2013 04:34  WBC Latest Range: 4.0-10.5 K/uL 8.4  RBC Latest Range: 4.22-5.81 MIL/uL 2.70 (L)  Hemoglobin Latest Range: 13.0-17.0 g/dL 8.8 (L)  HCT Latest Range: 39.0-52.0 % 25.9 (L)  MCV Latest Range: 78.0-100.0 fL 95.9  MCH Latest Range: 26.0-34.0 pg 32.6  MCHC Latest Range: 30.0-36.0 g/dL 34.0  RDW Latest Range: 11.5-15.5 % 14.0  Platelets Latest Range: 150-400 K/uL 126 (L)    Exam  Gen: awake and alert, NAD Ext:       Right Lower Extremity               Ex fix stable             VAC with good suction             Swelling stable             Ext warm, + DP and PT pulses             EHL, FHL motor intact             Weak ankle extension and flexion               DPN, SPN sensation dec compared to contra-lateral side             TN sensation improved      Assessment and Plan   POD/HD#: 2   68 y/o male crush by tree limb to R leg   1. Crush injury   2. Closed R proximal tibial plateau fracture at trifurcation with vascular injury               S/p vascular repair, ex fix and fasciotomies                          Return to the OR tomorrow for second look and eval soft tissue +/- closure of fasciotomies and possible STSG              It will be about 2-3 weeks before we can consider fixation of his fractures and soft tissue  structures             Will likely need CT and MRI of knee to eval injury               NWB R leg for now             Elevate to minimize swelling             Ice PRN  ACE from foot to thigh as well             continue with foot strap              Continue to monitor drain output- may consider holding lovenox   3. ABL anemia             will give 2 units in anticipation for OR tomorrow and CAD hx given recent trend of H/H  Do not expect a lot of blood loss tomorrow    4. VTE             lovenox   5. ID             Ancef q8 while fasciotomies are open   6. Dispo             ICU today               OR Thursday     Jari Pigg, PA-C [facility] [phone] (P) 10/08/2013 8:29 AM  **Disclaimer: This note may have been dictated with voice recognition software. Similar sounding words can inadvertently be transcribed and this note may contain transcription errors which may not have been corrected upon publication of note.**

## 2013-10-08 NOTE — Progress Notes (Signed)
Physical Therapy Treatment Patient Details Name: Bruce [name] MRN: [id number] DOB: 07/15/1945 Today's Date: 10/08/2013    History of Present Illness Patient is a 68 yo male with Severe ischemia right leg associated with popliteal artery injury and tib-fib fracture, now s/p CLOSED REDUCTION RIGHT TIBIAL PLATEAU FRACTURE, EXTERNAL FIXATION RIGHT LEG, PLACEMENT OF WOUND VAC (Right).    PT Comments    Patient OOB to chair this session, educated on use of RW for transfers. Patient able to stand and take minimal pivotal steps to chair with good compliance of NWBing. Patient did have increased HR 153 when standing. Nsg aware. Will continue to see and progress education and activity as tolerated.    Follow Up Recommendations  Supervision/Assistance - 24 hour     Equipment Recommendations  Rolling walker with 5" wheels;3in1 (PT);Wheelchair (measurements PT);Wheelchair cushion (measurements PT)    Recommendations for Other Services       Precautions / Restrictions Precautions Precautions: Fall Required Braces or Orthoses:  (Wound Vac, external fixator RLE) Restrictions Weight Bearing Restrictions: Yes RLE Weight Bearing: Non weight bearing    Mobility  Bed Mobility Overal bed mobility: Needs Assistance;+2 for physical assistance Bed Mobility: Supine to Sit;Sit to Supine     Supine to sit: Mod assist;+2 for physical assistance Sit to supine: Mod assist   General bed mobility comments: assist for RLE control and trunk support to come to upright, Assist for hip rotation to EOB.   Transfers Overall transfer level: Needs assistance Equipment used: Rolling walker (2 wheeled) Transfers: Sit to/from Stand Sit to Stand: Mod assist;From elevated surface Stand pivot transfers: Mod assist;+2 safety/equipment;From elevated surface       General transfer comment: Assist to elevate to standing, patient became very tachicardic 150s, Cued patient for hand placement and use of RW.  Demonstrates  difficulty bearing weight through the LUE secondary to IV placement.  Pt unable to support his weight for taking steps.  Ambulation/Gait Ambulation/Gait assistance: Mod assist;+2 physical assistance Ambulation Distance (Feet): 4 Feet Assistive device: Rolling walker (2 wheeled)       General Gait Details: pivotal steps to chair   Stairs            Wheelchair Mobility    Modified Rankin (Stroke Patients Only)       Balance Overall balance assessment: Needs assistance Sitting-balance support: Bilateral upper extremity supported Sitting balance-Leahy Scale: Fair     Standing balance support: Bilateral upper extremity supported Standing balance-Leahy Scale: Poor                      Cognition Arousal/Alertness: Awake/alert Behavior During Therapy: WFL for tasks assessed/performed Overall Cognitive Status: Within Functional Limits for tasks assessed                      Exercises      General Comments General comments (skin integrity, edema, etc.): external fixator, wound vac      Pertinent Vitals/Pain 8/10 pain, HR increased to 153s with standing    Home Living                      Prior Function            PT Goals (current goals can now be found in the care plan section) Acute Rehab PT Goals Patient Stated Goal: to go home PT Goal Formulation: With patient Time For Goal Achievement: 10/21/13 Potential to Achieve Goals: Good Progress towards PT goals: Progressing  toward goals    Frequency  Min 4X/week    PT Plan Current plan remains appropriate    Co-evaluation   Reason for Co-Treatment: Complexity of the patient's impairments (multi-system involvement);For patient/therapist safety   OT goals addressed during session: ADL's and self-care;Proper use of Adaptive equipment and DME     End of Session Equipment Utilized During Treatment: Gait belt;Oxygen Activity Tolerance: Patient tolerated treatment well;Patient  limited by pain Patient left: in bed;with call bell/phone within reach;with family/visitor present     Time: 1047-1101 PT Time Calculation (min): 14 min  Charges:  $Therapeutic Activity: 8-22 mins                    G CodesDuncan Dull 11/01/13, 12:33 PM Alben Deeds, [place] DPT  [phone]

## 2013-10-08 NOTE — Progress Notes (Signed)
Patient ID: Bruce [name], male   DOB: [birth year], 68 y.o.   MRN: [id number] 2 Days Post-Op  Subjective: Nausea resolved, wheezing better  Objective: Vital signs in last 24 hours: Temp:  [98.7 F (37.1 C)-99.6 F (37.6 C)] 99 F (37.2 C) (08/05 0800) Pulse Rate:  [89-123] 112 (08/05 0900) Resp:  [13-25] 17 (08/05 0900) BP: (93-124)/(34-74) 120/51 mmHg (08/05 0900) SpO2:  [90 %-100 %] 90 % (08/05 0900) Arterial Line BP: (73-132)/(59-64) 73/59 mmHg (08/04 1300)    Intake/Output from previous day: 08/04 0701 - 08/05 0700 In: 3490 [P.O.:940; I.V.:2400; IV Piggyback:150] Out: 2055 [MCNOB:0962; Drains:410] Intake/Output this shift: Total I/O In: 100 [I.V.:100] Out: -   General appearance: alert and cooperative Resp: clear to auscultation bilaterally Cardio: regular rate and rhythm GI: soft, NT, +BS Extremities: ex fix RLE, blister high right medial thigh, doppler DP and toes warm Neuro: A&O, moves R toes  Lab Results: CBC   Recent Labs  10/07/13 0500 10/08/13 0434  WBC 10.1 8.4  HGB 10.2* 8.8*  HCT 29.5* 25.9*  PLT 155 126*   BMET  Recent Labs  10/07/13 0500 10/08/13 0240  NA 139 137  K 4.7 4.9  CL 108 104  CO2 21 22  GLUCOSE 143* 114*  BUN 18 14  CREATININE 1.17 0.95  CALCIUM 7.4* 7.9*   PT/INR  Recent Labs  10/06/13 1700  LABPROT 16.0*  INR 1.28    Anti-infectives: Anti-infectives   Start     Dose/Rate Route Frequency Ordered Stop   10/07/13 1000  ceFAZolin (ANCEF) IVPB 1 g/50 mL premix     1 g 100 mL/hr over 30 Minutes Intravenous Every 8 hours 10/07/13 0857        Assessment/Plan: Log vs knee R popliteal artery injury - S/P repair by Dr. Donnetta Hutching, good doppler signal R tibial plateau FX/compartment syndrome - S/P ex fix by Dr. Marcelino Scot, will return for evaluation of soft tissues tomorrow Nausea - resolved, advance diet, NPO after MN Asthma/Wheeze - better with duonebs ABL anemia - receiving 2u PRBC per ortho, check CBC after TF VTE -  Lovenox Dispo - SDU  LOS: 2 days    Georganna Skeans, MD, MPH, FACS Trauma: [phone] General Surgery: [phone]  10/08/2013

## 2013-10-08 NOTE — Progress Notes (Signed)
Occupational Therapy Treatment Patient Details Name: Bruce [name] MRN: [id number] DOB: [birth year] Today's Date: 10/08/2013    History of present illness Patient is a 68 yo male with Severe ischemia right leg associated with popliteal artery injury and tib-fib fracture, now s/p CLOSED REDUCTION RIGHT TIBIAL PLATEAU FRACTURE, EXTERNAL FIXATION RIGHT LEG, PLACEMENT OF WOUND VAC (Right).   OT comments  Pt with elevated HR into the low 150s with standing and simulated toilet transfer this session.  Also with limitations in ability to take steps and maintain NWBIng secondary to IV placement in his left wrist.  Feel he will be able to do better if IV is placed in a different location.  Still needs total +2 (pt 50%) but should make good progress.  Will continue to follow with anticipation of reaching at least min assist or better for discharge home.   Follow Up Recommendations  Home health OT;Supervision/Assistance - 24 hour    Equipment Recommendations  3 in 1 bedside comode;Tub/shower bench;Wheelchair (measurements OT);Wheelchair cushion (measurements OT)       Precautions / Restrictions Precautions Precautions: Fall Required Braces or Orthoses:  (Wound Vac, external fixator RLE) Restrictions Weight Bearing Restrictions: Yes RLE Weight Bearing: Non weight bearing       Mobility Bed Mobility Overal bed mobility: Needs Assistance;+2 for physical assistance Bed Mobility: Supine to Sit;Sit to Supine     Supine to sit: Mod assist;+2 for physical assistance Sit to supine: Mod assist   General bed mobility comments: assist for RLE control and trunk support to come to upright, Assist for hip rotation to EOB.   Transfers Overall transfer level: Needs assistance Equipment used: Rolling walker (2 wheeled) Transfers: Sit to/from Stand Sit to Stand: Mod assist;From elevated surface Stand pivot transfers: Mod assist;+2 safety/equipment;From elevated surface       General transfer comment:  Assist to elevate to standing, patient became very tachicardic 150s, Cued patient for hand placement and use of RW.  Demonstrates difficulty bearing weight through the LUE secondary to IV placement.  Pt unable to support his weight for taking steps.    Balance Overall balance assessment: Needs assistance Sitting-balance support: Bilateral upper extremity supported Sitting balance-Leahy Scale: Fair     Standing balance support: Bilateral upper extremity supported Standing balance-Leahy Scale: Poor                              Perception Perception Perception Tested?: No   Praxis Praxis Praxis tested?: Not tested    Cognition   Behavior During Therapy: WFL for tasks assessed/performed Overall Cognitive Status: Within Functional Limits for tasks assessed                                    Pertinent Vitals/ Pain       O2 sats 99% on 4Ls nasal cannula, HR 104 resting with increased to 150 with transfer and standing         Frequency Min 2X/week     Progress Toward Goals  OT Goals(current goals can now be found in the care plan section)  Progress towards OT goals: Progressing toward goals  Acute Rehab OT Goals Patient Stated Goal: to go home  Plan Discharge plan remains appropriate    Co-evaluation    PT/OT/SLP Co-Evaluation/Treatment: Yes Reason for Co-Treatment: Complexity of the patient's impairments (multi-system involvement);For patient/therapist safety   OT goals addressed during  session: ADL's and self-care;Proper use of Adaptive equipment and DME      End of Session Equipment Utilized During Treatment: Gait belt;Oxygen;Other (comment) (external fixator)   Activity Tolerance Patient limited by fatigue (Elevated HR with minimal activity)   Patient Left in chair;with call bell/phone within reach;with family/visitor present   Nurse Communication Mobility status        Time: 1030-1100 OT Time Calculation (min): 30 min  Charges:  OT General Charges $OT Visit: 1 Procedure OT Treatments $Self Care/Home Management : 8-22 mins  Chaelyn Bunyan OTR/L 10/08/2013, 12:30 PM

## 2013-10-09 ENCOUNTER — Inpatient Hospital Stay (HOSPITAL_COMMUNITY): Payer: Medicare HMO | Admitting: Anesthesiology

## 2013-10-09 ENCOUNTER — Encounter (HOSPITAL_COMMUNITY): Payer: Self-pay | Admitting: Certified Registered"

## 2013-10-09 ENCOUNTER — Encounter (HOSPITAL_COMMUNITY): Admission: EM | Disposition: A | Discharge status: Home Health | Payer: Self-pay | Source: Home / Self Care

## 2013-10-09 ENCOUNTER — Encounter (HOSPITAL_COMMUNITY): Payer: Medicare HMO | Admitting: Anesthesiology

## 2013-10-09 DIAGNOSIS — D62 Acute posthemorrhagic anemia: Secondary | ICD-10-CM

## 2013-10-09 HISTORY — PX: I&D EXTREMITY: SHX5045

## 2013-10-09 LAB — PREPARE FRESH FROZEN PLASMA
UNIT DIVISION: 0
Unit division: 0
Unit division: 0

## 2013-10-09 LAB — TYPE AND SCREEN
ABO/RH(D): B POS
Antibody Screen: NEGATIVE
UNIT DIVISION: 0
UNIT DIVISION: 0
Unit division: 0
Unit division: 0
Unit division: 0
Unit division: 0

## 2013-10-09 LAB — BASIC METABOLIC PANEL
ANION GAP: 10 (ref 5–15)
BUN: 15 mg/dL (ref 6–23)
CO2: 25 mEq/L (ref 19–32)
CREATININE: 0.97 mg/dL (ref 0.50–1.35)
Calcium: 8 mg/dL — ABNORMAL LOW (ref 8.4–10.5)
Chloride: 106 mEq/L (ref 96–112)
GFR calc non Af Amer: 84 mL/min — ABNORMAL LOW (ref >90)
Glucose, Bld: 112 mg/dL — ABNORMAL HIGH (ref 70–99)
Potassium: 4.7 mEq/L (ref 3.7–5.3)
Sodium: 141 mEq/L (ref 137–147)

## 2013-10-09 LAB — CBC
HCT: 26.5 % — ABNORMAL LOW (ref 39.0–52.0)
HEMATOCRIT: 25.1 % — AB (ref 39.0–52.0)
HEMOGLOBIN: 8.6 g/dL — AB (ref 13.0–17.0)
Hemoglobin: 9.1 g/dL — ABNORMAL LOW (ref 13.0–17.0)
MCH: 31.8 pg (ref 26.0–34.0)
MCH: 31.9 pg (ref 26.0–34.0)
MCHC: 34.3 g/dL (ref 30.0–36.0)
MCHC: 34.3 g/dL (ref 30.0–36.0)
MCV: 92.7 fL (ref 78.0–100.0)
MCV: 93 fL (ref 78.0–100.0)
PLATELETS: 125 10*3/uL — AB (ref 150–400)
Platelets: 127 10*3/uL — ABNORMAL LOW (ref 150–400)
RBC: 2.86 MIL/uL — ABNORMAL LOW (ref 4.22–5.81)
RBC: 2.7 MIL/uL — AB (ref 4.22–5.81)
RDW: 15.2 % (ref 11.5–15.5)
RDW: 14.8 % (ref 11.5–15.5)
WBC: 10.2 10*3/uL (ref 4.0–10.5)
WBC: 6.8 10*3/uL (ref 4.0–10.5)

## 2013-10-09 SURGERY — IRRIGATION AND DEBRIDEMENT EXTREMITY
Anesthesia: General | Laterality: Right

## 2013-10-09 MED ORDER — FENTANYL CITRATE 0.05 MG/ML IJ SOLN
INTRAMUSCULAR | Status: AC
  Filled 2013-10-09: qty 5

## 2013-10-09 MED ORDER — MIDAZOLAM HCL 2 MG/2ML IJ SOLN
INTRAMUSCULAR | Status: AC
  Filled 2013-10-09: qty 2

## 2013-10-09 MED ORDER — LIDOCAINE HCL (CARDIAC) 20 MG/ML IV SOLN
INTRAVENOUS | Status: DC | PRN
  Administered 2013-10-09: 20 mg via INTRAVENOUS

## 2013-10-09 MED ORDER — ONDANSETRON HCL 4 MG/2ML IJ SOLN
INTRAMUSCULAR | Status: AC
  Filled 2013-10-09: qty 2

## 2013-10-09 MED ORDER — ONDANSETRON HCL 4 MG/2ML IJ SOLN: 4.0000 mg | Freq: Once | INTRAMUSCULAR | Status: DC | PRN

## 2013-10-09 MED ORDER — PHENYLEPHRINE HCL 10 MG/ML IJ SOLN
INTRAMUSCULAR | Status: DC | PRN
  Administered 2013-10-09: 80 ug via INTRAVENOUS

## 2013-10-09 MED ORDER — ALBUTEROL SULFATE (2.5 MG/3ML) 0.083% IN NEBU
2.5000 mg | INHALATION_SOLUTION | Freq: Four times a day (QID) | RESPIRATORY_TRACT | Status: DC | PRN
Start: 2013-10-09 — End: 2013-10-09

## 2013-10-09 MED ORDER — SUCCINYLCHOLINE CHLORIDE 20 MG/ML IJ SOLN
INTRAMUSCULAR | Status: DC | PRN
  Administered 2013-10-09: 100 mg via INTRAVENOUS

## 2013-10-09 MED ORDER — ALBUTEROL SULFATE (2.5 MG/3ML) 0.083% IN NEBU
INHALATION_SOLUTION | RESPIRATORY_TRACT | Status: AC
  Administered 2013-10-09: 2.5 mg
  Filled 2013-10-09: qty 3

## 2013-10-09 MED ORDER — MEPERIDINE HCL 25 MG/ML IJ SOLN
INTRAMUSCULAR | Status: AC
  Administered 2013-10-09: 12.5 mg
  Filled 2013-10-09: qty 1

## 2013-10-09 MED ORDER — SODIUM CHLORIDE 0.9 % IR SOLN
Status: DC | PRN
  Administered 2013-10-09: 3000 mL

## 2013-10-09 MED ORDER — ROCURONIUM BROMIDE 50 MG/5ML IV SOLN
INTRAVENOUS | Status: AC
  Filled 2013-10-09: qty 1

## 2013-10-09 MED ORDER — MIDAZOLAM HCL 5 MG/5ML IJ SOLN
INTRAMUSCULAR | Status: DC | PRN
  Administered 2013-10-09: 2 mg via INTRAVENOUS

## 2013-10-09 MED ORDER — CEFAZOLIN SODIUM 1-5 GM-% IV SOLN
1.0000 g | Freq: Three times a day (TID) | INTRAVENOUS | Status: AC
  Administered 2013-10-09 – 2013-10-10 (×3): 1 g via INTRAVENOUS
  Filled 2013-10-09 (×5): qty 50

## 2013-10-09 MED ORDER — LIDOCAINE HCL (CARDIAC) 20 MG/ML IV SOLN
INTRAVENOUS | Status: AC
  Filled 2013-10-09: qty 5

## 2013-10-09 MED ORDER — SODIUM CHLORIDE 0.9 % IJ SOLN
INTRAMUSCULAR | Status: AC
  Filled 2013-10-09: qty 10

## 2013-10-09 MED ORDER — SILVER SULFADIAZINE 1 % EX CREA
TOPICAL_CREAM | Freq: Two times a day (BID) | CUTANEOUS | Status: DC
  Administered 2013-10-09 – 2013-10-11 (×5): via TOPICAL
  Filled 2013-10-09 (×2): qty 85

## 2013-10-09 MED ORDER — PROPOFOL 10 MG/ML IV BOLUS
INTRAVENOUS | Status: DC | PRN
  Administered 2013-10-09: 150 mg via INTRAVENOUS

## 2013-10-09 MED ORDER — LACTATED RINGERS IV SOLN
INTRAVENOUS | Status: DC | PRN
  Administered 2013-10-09: 08:00:00 via INTRAVENOUS

## 2013-10-09 MED ORDER — HYDROMORPHONE HCL PF 1 MG/ML IJ SOLN: 0.2500 mg | INTRAMUSCULAR | Status: DC | PRN

## 2013-10-09 MED ORDER — FENTANYL CITRATE 0.05 MG/ML IJ SOLN
INTRAMUSCULAR | Status: DC | PRN
  Administered 2013-10-09: 100 ug via INTRAVENOUS

## 2013-10-09 MED ORDER — FENTANYL CITRATE 0.05 MG/ML IJ SOLN: 50.0000 ug | INTRAMUSCULAR | Status: DC | PRN

## 2013-10-09 MED ORDER — SUCCINYLCHOLINE CHLORIDE 20 MG/ML IJ SOLN
INTRAMUSCULAR | Status: AC
  Filled 2013-10-09: qty 1

## 2013-10-09 MED ORDER — MIDAZOLAM HCL 2 MG/2ML IJ SOLN: 1.0000 mg | INTRAMUSCULAR | Status: DC | PRN

## 2013-10-09 MED ORDER — ONDANSETRON HCL 4 MG/2ML IJ SOLN
INTRAMUSCULAR | Status: DC | PRN
  Administered 2013-10-09: 4 mg via INTRAVENOUS

## 2013-10-09 MED ORDER — EPHEDRINE SULFATE 50 MG/ML IJ SOLN
INTRAMUSCULAR | Status: AC
  Filled 2013-10-09: qty 1

## 2013-10-09 MED ORDER — PHENYLEPHRINE 40 MCG/ML (10ML) SYRINGE FOR IV PUSH (FOR BLOOD PRESSURE SUPPORT)
PREFILLED_SYRINGE | INTRAVENOUS | Status: AC
  Filled 2013-10-09: qty 10

## 2013-10-09 MED ORDER — PROPOFOL 10 MG/ML IV BOLUS
INTRAVENOUS | Status: AC
  Filled 2013-10-09: qty 20

## 2013-10-09 SURGICAL SUPPLY — 45 items
BLADE SURG 10 STRL SS (BLADE) ×3 IMPLANT
BNDG COHESIVE 4X5 TAN STRL (GAUZE/BANDAGES/DRESSINGS) ×3 IMPLANT
BNDG GAUZE ELAST 4 BULKY (GAUZE/BANDAGES/DRESSINGS) ×6 IMPLANT
BNDG GAUZE STRTCH 6 (GAUZE/BANDAGES/DRESSINGS) ×9 IMPLANT
BRUSH SCRUB DISP (MISCELLANEOUS) ×6 IMPLANT
CANISTER WOUND CARE 500ML ATS (WOUND CARE) ×2 IMPLANT
COVER SURGICAL LIGHT HANDLE (MISCELLANEOUS) ×6 IMPLANT
DRAPE U-SHAPE 47X51 STRL (DRAPES) ×3 IMPLANT
DRSG ADAPTIC 3X8 NADH LF (GAUZE/BANDAGES/DRESSINGS) ×3 IMPLANT
DRSG MEPITEL 4X7.2 (GAUZE/BANDAGES/DRESSINGS) ×4 IMPLANT
DRSG VAC ATS LRG SENSATRAC (GAUZE/BANDAGES/DRESSINGS) ×2 IMPLANT
ELECT REM PT RETURN 9FT ADLT (ELECTROSURGICAL)
ELECTRODE REM PT RTRN 9FT ADLT (ELECTROSURGICAL) IMPLANT
GAUZE SPONGE 4X4 12PLY STRL (GAUZE/BANDAGES/DRESSINGS) ×3 IMPLANT
GLOVE BIO SURGEON STRL SZ7.5 (GLOVE) ×3 IMPLANT
GLOVE BIO SURGEON STRL SZ8 (GLOVE) ×3 IMPLANT
GLOVE BIOGEL PI IND STRL 7.5 (GLOVE) ×1 IMPLANT
GLOVE BIOGEL PI IND STRL 8 (GLOVE) ×1 IMPLANT
GLOVE BIOGEL PI INDICATOR 7.5 (GLOVE) ×2
GLOVE BIOGEL PI INDICATOR 8 (GLOVE) ×2
GOWN STRL REUS W/ TWL LRG LVL3 (GOWN DISPOSABLE) ×2 IMPLANT
GOWN STRL REUS W/ TWL XL LVL3 (GOWN DISPOSABLE) ×1 IMPLANT
GOWN STRL REUS W/TWL LRG LVL3 (GOWN DISPOSABLE) ×6
GOWN STRL REUS W/TWL XL LVL3 (GOWN DISPOSABLE) ×3
KIT BASIN OR (CUSTOM PROCEDURE TRAY) ×3 IMPLANT
KIT ROOM TURNOVER OR (KITS) ×3 IMPLANT
MANIFOLD NEPTUNE II (INSTRUMENTS) ×3 IMPLANT
NS IRRIG 1000ML POUR BTL (IV SOLUTION) ×3 IMPLANT
PACK ORTHO EXTREMITY (CUSTOM PROCEDURE TRAY) ×3 IMPLANT
PAD ARMBOARD 7.5X6 YLW CONV (MISCELLANEOUS) ×2 IMPLANT
PAD NEG PRESSURE SENSATRAC (MISCELLANEOUS) ×3 IMPLANT
PADDING CAST COTTON 6X4 STRL (CAST SUPPLIES) ×3 IMPLANT
SPONGE LAP 18X18 X RAY DECT (DISPOSABLE) ×3 IMPLANT
STOCKINETTE IMPERVIOUS 9X36 MD (GAUZE/BANDAGES/DRESSINGS) ×3 IMPLANT
SUT ETHILON 2 0 FSLX (SUTURE) ×8 IMPLANT
SUT ETHILON 2 LR (SUTURE) ×6 IMPLANT
SUT PDS AB 2-0 CT1 27 (SUTURE) ×6 IMPLANT
TOWEL OR 17X24 6PK STRL BLUE (TOWEL DISPOSABLE) ×3 IMPLANT
TOWEL OR 17X26 10 PK STRL BLUE (TOWEL DISPOSABLE) ×6 IMPLANT
TUBE ANAEROBIC SPECIMEN COL (MISCELLANEOUS) IMPLANT
TUBE CONNECTING 12'X1/4 (SUCTIONS) ×1
TUBE CONNECTING 12X1/4 (SUCTIONS) ×2 IMPLANT
UNDERPAD 30X30 INCONTINENT (UNDERPADS AND DIAPERS) ×3 IMPLANT
WATER STERILE IRR 1000ML POUR (IV SOLUTION) ×3 IMPLANT
YANKAUER SUCT BULB TIP NO VENT (SUCTIONS) ×3 IMPLANT

## 2013-10-09 NOTE — Progress Notes (Signed)
Trauma PA notified of temp 102.7.

## 2013-10-09 NOTE — Anesthesia Postprocedure Evaluation (Signed)
  Anesthesia Post-op Note  Patient: Bruce [name]  Procedure(s) Performed: Procedure(s): IRRIGATION AND DEBRIDEMENT RIGHT LEG, CLOSURE  OF WOUNDS, PLACEMENT OF WOUND VAC ON EACH SIDE OF LEG (Right)  Patient Location: PACU  Anesthesia Type:General  Level of Consciousness: awake, alert  and oriented  Airway and Oxygen Therapy: Patient Spontanous Breathing and Patient connected to nasal cannula oxygen  Post-op Pain: mild  Post-op Assessment: Post-op Vital signs reviewed, Patient's Cardiovascular Status Stable, Respiratory Function Stable, Patent Airway and Pain level controlled  Post-op Vital Signs: stable  Last Vitals:  Filed Vitals:   10/09/13 1400  BP: 100/52  Pulse: 110  Temp: 37.5 C  Resp: 19    Complications: No apparent anesthesia complications

## 2013-10-09 NOTE — Progress Notes (Signed)
Dr Linna Caprice at bedside

## 2013-10-09 NOTE — Op Note (Signed)
[name], [name]:  [id number]  MEDICAL RECORD NO.:  [id number]  LOCATION:  3M13C                        FACILITY:  [facility]  PHYSICIAN:  Bruce [name], M.D. DATE OF BIRTH:  08/19/1945  DATE OF PROCEDURE:  10/09/2013 DATE OF DISCHARGE:                              OPERATIVE REPORT   PREOPERATIVE DIAGNOSIS:  Open right leg medial and lateral fasciotomies status post external fixation and revascularization.  POSTOPERATIVE DIAGNOSIS:  Open right leg medial and lateral fasciotomies status post external fixation and revascularization.  PROCEDURE: 1. Layered closure of medial and lateral fasciotomies, 53 cm total, requiring retention sutures over portion of medial wound. 2. Application of large wound VAC.  SURGEON:  Bruce Divine. Marcelino Scot, MD  ASSISTANT:  Bruce Pigg, PA  ANESTHESIA:  General.  COMPLICATIONS:  None.  TOURNIQUET:  None.  I/O:  600 mL crystalloid/EBL 50 mL.  DISPOSITION:  To PACU.  CONDITION:  Stable.  BRIEF SUMMARY AND INDICATION FOR PROCEDURE:  Bruce [name] is a 68 year old male who was involved in a crush injury to the right leg while using an excavator.  He underwent spanning external fixation compartment release and revascularization at approximately 5 hours from injury. Preoperative examination revealed no motor function, no sensation, and no pulses distally.  Intraoperatively, his muscle did not contract to electrocautery.  Subsequent to his revascularization he has recovered both sensation and motor function with palpable pulses.  We discussed return to the OR today for a second look with possible debridement and possible closure for split-thickness skin grafting.  The patient understood the risks to be need for further surgery, infection, nerve injury, vessel injury, DVT, PE, anesthetic complications, heart attack, stroke, and others and did wish to proceed.  BRIEF SUMMARY OF PROCEDURE:  Bruce [name] was given Ancef  preoperatively, taken to the operating room where general anesthesia was induced.  His right lower extremity was prepped and draped in usual sterile fashion. No tourniquet was used during the procedure.  The VACs were removed and standard prep and drape performed without getting Betadine into the open soft tissue wounds.  The wounds were thoroughly irrigated and examined. I did extend the deep posterior fasciotomy and here the muscle was contractile, the superficial posterior contractile as well saphenous remained patent.  The anterior and lateral both looked excellent with healthy pink tissue all of which was contractile as well.  There did not appear to be any areas of necrosis within the muscle bellies.  Wounds were thoroughly irrigated and then closed in layered fashion using a combination of 2-0 Vicryl and 2-0 nylon on the lateral side and 2-0 PDS and nylon on the medial side.  Some use of retention sutures was employed on the medial side as well.  Wound VACs were then placed over the extent of both incisions approximately 50 x 3 cm, and then a sterile gently compressive dressing from foot to thigh.  The pins were wrapped with Kerlix.  The medial thigh wound dressing was left intact as there were bullae on either side of this dressing.  We do not wish to pull at all and release these blisters at this time but to allow them  to mature some.  Bruce Spinner, PA-C assisted me throughout.  We did work simultaneously on the medial and lateral sides so to expedite the patient's time in the OR.  PROGNOSIS:  Bruce [name] will be nonweightbearing on the right lower extremity with elevation,  continued active motion distally.  We anticipate return to the OR in 2 weeks or so for definitive osteosynthesis.  He is at elevated risk for infection and nonunion given the need for fasciotomies and revascularization.     Bruce [name], M.D.     MHH/MEDQ  D:  10/09/2013  T:  10/09/2013  Job:   [id number]

## 2013-10-09 NOTE — Progress Notes (Signed)
I have seen and examined the patient. I agree with the findings above.  Rozanna Box, MD 10/09/2013 9:50 AM

## 2013-10-09 NOTE — Anesthesia Procedure Notes (Signed)
Procedure Name: Intubation Date/Time: 10/09/2013 8:28 AM Performed by: Julian Reil Pre-anesthesia Checklist: Patient identified, Emergency Drugs available, Suction available and Patient being monitored Patient Re-evaluated:Patient Re-evaluated prior to inductionOxygen Delivery Method: Circle system utilized Preoxygenation: Pre-oxygenation with 100% oxygen Intubation Type: IV induction Ventilation: Mask ventilation with difficulty and Oral airway inserted - appropriate to patient size Laryngoscope Size: Mac and 4 Grade View: Grade I Tube type: Oral Tube size: 7.5 mm Number of attempts: 1 Airway Equipment and Method: Stylet Placement Confirmation: ETT inserted through vocal cords under direct vision,  positive ETCO2 and breath sounds checked- equal and bilateral Secured at: 22 cm Tube secured with: Tape Dental Injury: Teeth and Oropharynx as per pre-operative assessment

## 2013-10-09 NOTE — Progress Notes (Signed)
Pt taken to OR. Report given bedside to [facility].

## 2013-10-09 NOTE — Progress Notes (Signed)
I have seen and examined the patient. I agree with the findings above.  Rozanna Box, MD 10/09/2013 9:51 AM

## 2013-10-09 NOTE — Anesthesia Preprocedure Evaluation (Addendum)
Anesthesia Evaluation  Patient identified by MRN, date of birth, ID band Patient awake    Reviewed: Allergy & Precautions, H&P , NPO status , Patient's Chart, lab work & pertinent test results  Airway Mallampati: II TM Distance: >3 FB     Dental  (+) Teeth Intact, Dental Advisory Given   Pulmonary Current Smoker,  breath sounds clear to auscultation        Cardiovascular hypertension, Rhythm:Regular Rate:Normal     Neuro/Psych    GI/Hepatic   Endo/Other    Renal/GU      Musculoskeletal   Abdominal   Peds  Hematology   Anesthesia Other Findings   Reproductive/Obstetrics                           Anesthesia Physical Anesthesia Plan  ASA: III  Anesthesia Plan: General   Post-op Pain Management:    Induction: Intravenous  Airway Management Planned: Oral ETT  Additional Equipment:   Intra-op Plan:   Post-operative Plan: Extubation in OR  Informed Consent: I have reviewed the patients History and Physical, chart, labs and discussed the procedure including the risks, benefits and alternatives for the proposed anesthesia with the patient or authorized representative who has indicated his/her understanding and acceptance.   Dental advisory given  Plan Discussed with: CRNA and Anesthesiologist  Anesthesia Plan Comments: (S/P R. 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

## 2013-10-09 NOTE — Progress Notes (Signed)
Patient ID: Bruce [name], male   DOB: [birth year], 68 y.o.   MRN: [id number]   LOS: 3 days   Subjective: Doing well after OR, pain controlled. C/o blistering on left prox thigh.   Objective: Vital signs in last 24 hours: Temp:  [98 F (36.7 C)-102.8 F (39.3 C)] 99.5 F (37.5 C) (08/06 1400) Pulse Rate:  [77-131] 110 (08/06 1400) Resp:  [11-23] 19 (08/06 1400) BP: (91-129)/(49-70) 100/52 mmHg (08/06 1400) SpO2:  [91 %-100 %] 96 % (08/06 1400) Last BM Date:  (PTA)   Laboratory  CBC  Recent Labs  10/08/13 1500 10/09/13 0320  WBC 8.9 10.2  HGB 10.1* 9.1*  HCT 29.5* 26.5*  PLT 125* 125*   BMET  Recent Labs  10/08/13 0240 10/09/13 0320  NA 137 141  K 4.9 4.7  CL 104 106  CO2 22 25  GLUCOSE 114* 112*  BUN 14 15  CREATININE 0.95 0.97  CALCIUM 7.9* 8.0*    Physical Exam General appearance: alert and no distress Resp: Expiratory wheezes bilaterally Cardio: regular rate and rhythm GI: normal findings: bowel sounds normal and soft, non-tender Extremities: NVI   Assessment/Plan: Log vs knee  R popliteal artery injury - S/P repair by Dr. Donnetta Hutching, good doppler signal  R tibial plateau FX/compartment syndrome - S/P ex fix by Dr. Marcelino Scot, will need ORIF in ~2 weeks ABL anemia - Check now after OR Asthma/Wheeze - better with duonebs  FEN -- No issues VTE - Lovenox, left SCD Dispo - Transfer to floor    Lisette Abu, PA-C Pager: [phone] General Trauma PA Pager: [phone]  10/09/2013

## 2013-10-09 NOTE — Transfer of Care (Signed)
Immediate Anesthesia Transfer of Care Note  Patient: Bruce [name]  Procedure(s) Performed: Procedure(s): IRRIGATION AND DEBRIDEMENT RIGHT LEG, CLOSURE  OF WOUNDS, PLACEMENT OF WOUND VAC ON EACH SIDE OF LEG (Right)  Patient Location: PACU  Anesthesia Type:General  Level of Consciousness: awake, alert , oriented and patient cooperative  Airway & Oxygen Therapy: Patient Spontanous Breathing and Patient connected to nasal cannula oxygen  Post-op Assessment: Report given to PACU RN, Post -op Vital signs reviewed and stable and Patient moving all extremities  Post vital signs: Reviewed and stable  Complications: No apparent anesthesia complications

## 2013-10-09 NOTE — Brief Op Note (Signed)
10/06/2013 - 10/09/2013  9:52 AM  PATIENT:  Bruce [name]  68 y.o. male  PRE-OPERATIVE DIAGNOSIS:  OPEN RIGHT LEG FASCIOTOMIES  POST-OPERATIVE DIAGNOSIS:  OPEN RIGHT LEG  FASCIOTOMIES  PROCEDURE:  Procedure(s): 1. LAYERED CLOSURE MEDIAL AND LATERAL FASCIOTOMIES 53CM TOTAL 2. APPLICATION OF WOUND VAC LARGE (Right)  SURGEON:  Surgeon(s) and Role:    * Rozanna Box, MD - Primary  PHYSICIAN ASSISTANT: Ainsley Spinner, PA-C  ANESTHESIA:   general  I/O:  Total I/O In: 600 [I.V.:600] Out: 50 [Blood:50]  SPECIMEN:  No Specimen  TOURNIQUET:  * No tourniquets in log *  DICTATION: .Other Dictation: Dictation Number [id number]

## 2013-10-09 NOTE — Progress Notes (Signed)
Agree Jovoni Borkenhagen, MD, MPH, FACS Trauma: [phone] General Surgery: [phone]  

## 2013-10-09 NOTE — Progress Notes (Signed)
Report given to maryann rn as caregiver 

## 2013-10-09 NOTE — Progress Notes (Signed)
PT Cancellation Note  Patient Details Name: Bruce [name] MRN: [id number] DOB: April 01, 1945   Cancelled Treatment:    Reason Eval/Treat Not Completed: Patient at procedure or test/unavailable (patient to OR today)   [name] 10/09/2013, [place], PT DPT  [phone]

## 2013-10-10 ENCOUNTER — Inpatient Hospital Stay (HOSPITAL_COMMUNITY): Payer: Medicare HMO

## 2013-10-10 ENCOUNTER — Telehealth: Payer: Self-pay | Admitting: Vascular Surgery

## 2013-10-10 ENCOUNTER — Encounter (HOSPITAL_COMMUNITY): Payer: Self-pay | Admitting: Orthopedic Surgery

## 2013-10-10 DIAGNOSIS — K2100 Gastro-esophageal reflux disease with esophagitis, without bleeding: Secondary | ICD-10-CM

## 2013-10-10 LAB — CBC
HEMATOCRIT: 24.4 % — AB (ref 39.0–52.0)
Hemoglobin: 8.3 g/dL — ABNORMAL LOW (ref 13.0–17.0)
MCH: 31.8 pg (ref 26.0–34.0)
MCHC: 34 g/dL (ref 30.0–36.0)
MCV: 93.5 fL (ref 78.0–100.0)
PLATELETS: 135 10*3/uL — AB (ref 150–400)
RBC: 2.61 MIL/uL — ABNORMAL LOW (ref 4.22–5.81)
RDW: 14.4 % (ref 11.5–15.5)
WBC: 6.1 10*3/uL (ref 4.0–10.5)

## 2013-10-10 MED ORDER — PANTOPRAZOLE SODIUM 40 MG PO TBEC
80.0000 mg | DELAYED_RELEASE_TABLET | Freq: Two times a day (BID) | ORAL | Status: DC
  Administered 2013-10-10 – 2013-10-11 (×3): 80 mg via ORAL
  Filled 2013-10-10 (×4): qty 2

## 2013-10-10 NOTE — Progress Notes (Signed)
Physical Therapy Treatment Patient Details Name: Bruce [name] MRN: [id number] DOB: [birth year] Today's Date: 10/10/2013    History of Present Illness Patient is a 68 yo male with Severe ischemia right leg associated with popliteal artery injury and tib-fib fracture, now s/p CLOSED REDUCTION RIGHT TIBIAL PLATEAU FRACTURE, EXTERNAL FIXATION RIGHT LEG, PLACEMENT OF WOUND VAC (Right).    PT Comments    Patient motivated and agreeable to therapy. Able to hop about 1ft with Min A for balance and cues for NWBing. Will need to work on [facility] mobility prior to DC home. Planning for DC at some point this weekend  Follow Up Recommendations  Supervision/Assistance - 24 hour     Equipment Recommendations  Rolling walker with 5" wheels;3in1 (PT);Wheelchair (measurements PT);Wheelchair cushion (measurements PT)    Recommendations for Other Services       Precautions / Restrictions Restrictions RLE Weight Bearing: Non weight bearing    Mobility  Bed Mobility Overal bed mobility: Needs Assistance;+2 for physical assistance       Supine to sit: Min assist     General bed mobility comments: assist for RLE control and trunk support to come to upright, Assist for hip rotation to EOB.   Transfers Overall transfer level: Needs assistance Equipment used: Rolling walker (2 wheeled)   Sit to Stand: From elevated surface;Min assist         General transfer comment: CUes for hand placement and positioning prior to sitting  Ambulation/Gait Ambulation/Gait assistance: Min assist;+2 safety/equipment;Mod assist Ambulation Distance (Feet): 12 Feet Assistive device: Rolling walker (2 wheeled) Gait Pattern/deviations: Step-to pattern     General Gait Details: Cues for use of RW. A for safety and balance but patient maintain NWBing fairly well. Cues for postioning   Stairs            Wheelchair Mobility    Modified Rankin (Stroke Patients Only)       Balance                                     Cognition Arousal/Alertness: Awake/alert Behavior During Therapy: WFL for tasks assessed/performed Overall Cognitive Status: Within Functional Limits for tasks assessed                      Exercises      General Comments        Pertinent Vitals/Pain Pain Assessment: 0-10 Pain Score: 8  Pain Location: R leg Pain Intervention(s): Limited activity within patient's tolerance    Home Living                      Prior Function            PT Goals (current goals can now be found in the care plan section) Progress towards PT goals: Progressing toward goals    Frequency  Min 4X/week    PT Plan Current plan remains appropriate    Co-evaluation             End of Session Equipment Utilized During Treatment: Gait belt Activity Tolerance: Patient tolerated treatment well Patient left: in chair;with call bell/phone within reach     Time: 1040-1107 PT Time Calculation (min): 27 min  Charges:  $Gait Training: 8-22 mins $Therapeutic Activity: 8-22 mins                    G Codes:  Jacqualyn Posey 10/10/2013, 12:26 PM 10/10/2013 Jacqualyn Posey PTA [phone] pager [phone] office

## 2013-10-10 NOTE — Progress Notes (Signed)
CARE MANAGEMENT NOTE 10/10/2013  Patient:  Bruce [name],Bruce [name]   Account Number:  [id number]  Date Initiated:  10/10/2013  Documentation initiated by:  Sandi Mariscal  Subjective/Objective Assessment:   vascular injury to leg     Action/Plan:   home with ex fix in place until it can be permanently fixed   Anticipated DC Date:  10/11/2013   Anticipated DC Plan:  [place]  CM consult      [facility] Choice  HOME HEALTH   Choice offered to / List presented to:  C-1 Patient   DME arranged  [place]      DME agency  [place]     HH arranged  HH-1 RN  HH-3 OT      [place].   Status of service:  Completed, signed off Medicare Important Message given?  YES (If response is "NO", the following Medicare IM given date fields will be blank) Date Medicare IM given:  10/10/2013 Medicare IM given by:  Sandi Mariscal Date Additional Medicare IM given:   Additional Medicare IM given by:    Discharge Disposition:  [place]  Per UR Regulation:  Reviewed for med. necessity/level of care/duration of stay  If discussed at [place] of Stay Meetings, dates discussed:    Comments:  10/10/2013 [place] to follow up on delivery time of DME. States orders not received. Refaxed orders. Will need to refax RW order on 10/11/2013 once signed by MD or PA. Jonnie Finner RN CCM Case Mgmt phone [phone]  Angelina Sheriff, RN Case Manager Addendum CASE MANAGEMENT Progress Notes Service date: 10/10/2013 4:55 PM Spoke with patient about DME needs.  OT has recommended the heavy duty bedside commode. Patient was able to get a family member/friend to loan him one as he doesn't meet the weight requirements for a heavy duty (bariatric) one and medicare would not pay for it ($181).  Other recommended DME--wheelchair, tub bench, rolling walker all sent to [employer] as  that is the company we are to use for [facility]. The original fax is in my top drawer on 6N in the left side of the quiet room outside of rooms 18-19 if needed.   Address and phone number are correct. Pt's sig other--Bruce [name] [phone].   HHRN/OT arranged with [facility] as per insurance guidelines.   Medicare IM (Important Message) delivered to patient today by me in anticipation of discharge.   Sandi Mariscal, RN BSN MHA CCM Case Manager, Trauma Service/Unit 21M [phone]

## 2013-10-10 NOTE — Discharge Instructions (Signed)
Orthopaedic Trauma Service Discharge Instructions   General Discharge Instructions  WEIGHT BEARING STATUS: Nonweightbearing Right leg  RANGE OF MOTION/ACTIVITY: range of motion of toes and ankle as tolerated. Continue to use foot strap. Activity as tolerated while maintaining weightbearing restrictions   Diet: as you were eating previously.  Can use over the counter stool softeners and bowel preparations, such as Miralax, to help with bowel movements.  Narcotics can be constipating.  Be sure to drink plenty of fluids  STOP SMOKING OR USING NICOTINE PRODUCTS!!!!  As discussed nicotine severely impairs your body's ability to heal surgical and traumatic wounds but also impairs bone healing.  Wounds and bone heal by forming microscopic blood vessels (angiogenesis) and nicotine is a vasoconstrictor (essentially, shrinks blood vessels).  Therefore, if vasoconstriction occurs to these microscopic blood vessels they essentially disappear and are unable to deliver necessary nutrients to the healing tissue.  This is one modifiable factor that you can do to dramatically increase your chances of healing your injury.    (This means no smoking, no nicotine gum, patches, etc)  DO NOT USE NONSTEROIDAL ANTI-INFLAMMATORY DRUGS (NSAID'S)  Using products such as Advil (ibuprofen), Aleve (naproxen), Motrin (ibuprofen) for additional pain control during fracture healing can delay and/or prevent the healing response.  If you would like to take over the counter (OTC) medication, Tylenol (acetaminophen) is ok.  However, some narcotic medications that are given for pain control contain acetaminophen as well. Therefore, you should not exceed more than 4000 mg of tylenol in a day if you do not have liver disease.  Also note that there are may OTC medicines, such as cold medicines and allergy medicines that my contain tylenol as well.  If you have any questions about medications and/or interactions please ask your doctor/PA or  your pharmacist.   PAIN MEDICATION USE AND EXPECTATIONS  You have likely been given narcotic medications to help control your pain.  After a traumatic event that results in an fracture (broken bone) with or without surgery, it is ok to use narcotic pain medications to help control one's pain.  We understand that everyone responds to pain differently and each individual patient will be evaluated on a regular basis for the continued need for narcotic medications. Ideally, narcotic medication use should last no more than 6-8 weeks (coinciding with fracture healing).   As a patient it is your responsibility as well to monitor narcotic medication use and report the amount and frequency you use these medications when you come to your office visit.   We would also advise that if you are using narcotic medications, you should take a dose prior to therapy to maximize you participation.  IF YOU ARE ON NARCOTIC MEDICATIONS IT IS NOT PERMISSIBLE TO OPERATE A MOTOR VEHICLE (MOTORCYCLE/CAR/TRUCK/MOPED) OR HEAVY MACHINERY DO NOT MIX NARCOTICS WITH OTHER CNS (CENTRAL NERVOUS SYSTEM) DEPRESSANTS SUCH AS ALCOHOL       ICE AND ELEVATE INJURED/OPERATIVE EXTREMITY  Using ice and elevating the injured extremity above your heart can help with swelling and pain control.  Icing in a pulsatile fashion, such as 20 minutes on and 20 minutes off, can be followed.    Do not place ice directly on skin. Make sure there is a barrier between to skin and the ice pack.    Using frozen items such as frozen peas works well as the conform nicely to the are that needs to be iced.  USE AN ACE WRAP OR TED HOSE FOR SWELLING CONTROL  In addition  to icing and elevation, Ace wraps or TED hose are used to help limit and resolve swelling.  It is recommended to use Ace wraps or TED hose until you are informed to stop.    When using Ace Wraps start the wrapping distally (farthest away from the body) and wrap proximally (closer to the  body)   Example: If you had surgery on your leg or thing and you do not have a splint on, start the ace wrap at the toes and work your way up to the thigh        If you had surgery on your upper extremity and do not have a splint on, start the ace wrap at your fingers and work your way up to the upper arm  IF YOU ARE IN A SPLINT OR CAST DO NOT [place]   If your splint gets wet for any reason please contact the office immediately. You may shower in your splint or cast as long as you keep it dry.  This can be done by wrapping in a cast cover or garbage back (or similar)  Do Not stick any thing down your splint or cast such as pencils, money, or hangers to try and scratch yourself with.  If you feel itchy take benadryl as prescribed on the bottle for itching  IF YOU ARE IN A CAM BOOT (BLACK BOOT)  You may remove boot periodically. Perform daily dressing changes as noted below.  Wash the liner of the boot regularly and wear a sock when wearing the boot. It is recommended that you sleep in the boot until told otherwise  CALL THE OFFICE WITH ANY QUESTIONS OR CONCERTS: [phone]     Discharge Pin Site Instructions  Dress pins daily with Kerlix roll starting on POD 2. Wrap the Kerlix so that it tamps the skin down around the pin-skin interface to prevent/limit motion of the skin relative to the pin.  (Pin-skin motion is the primary cause of pain and infection related to external fixator pin sites).  Remove any crust or coagulum that may obstruct drainage with a saline moistened gauze or soap and water.  After POD 3, if there is no discernable drainage on the pin site dressing, the interval for change can by increased to every other day.  You may shower with the fixator, cleaning all pin sites gently with soap and water.  If you have a surgical wound this needs to be completely dry and without drainage before showering.  The extremity can be lifted by the fixator to facilitate  wound care and transfers.  Notify the office/Doctor if you experience increasing drainage, redness, or pain from a pin site, or if you notice purulent (thick, snot-like) drainage.  Discharge Wound Care Instructions  Do NOT apply any ointments, solutions or lotions to pin sites or surgical wounds.  These prevent needed drainage and even though solutions like hydrogen peroxide kill bacteria, they also damage cells lining the pin sites that help fight infection.  Applying lotions or ointments can keep the wounds moist and can cause them to breakdown and open up as well. This can increase the risk for infection. When in doubt call the office.  Surgical incisions should be dressed daily.  If any drainage is noted, use one layer of adaptic, then gauze, Kerlix, and an ace wrap.  Once the incision is completely dry and without drainage, it may be left open to air out.  Showering may begin 36-48 hours later.  Cleaning gently with soap and water.  Traumatic wounds should be dressed daily as well.    One layer of adaptic, gauze, Kerlix, then ace wrap.  The adaptic can be discontinued once the draining has ceased    If you have a wet to dry dressing: wet the gauze with saline the squeeze as much saline out so the gauze is moist (not soaking wet), place moistened gauze over wound, then place a dry gauze over the moist one, followed by Kerlix wrap, then ace wrap.

## 2013-10-10 NOTE — Progress Notes (Signed)
Occupational Therapy Treatment  Patient Details Name: Bruce [name] MRN: [id number] DOB: 04-29-1945 Today's Date: 10/10/2013    History of present illness Patient is a 68 yo male with Severe ischemia right leg associated with popliteal artery injury and tib-fib fracture, now s/p CLOSED REDUCTION RIGHT TIBIAL PLATEAU FRACTURE, EXTERNAL FIXATION RIGHT LEG, PLACEMENT OF WOUND VAC (Right).   OT comments  This 68 yo male making progress, however with long leg external fixator he still has limitations. He will benefit from continued HHOT and 24 hour S with prn A.  Follow Up Recommendations  Home health OT;Supervision/Assistance - 24 hour    Equipment Recommendations  Tub/shower bench;Wheelchair (measurements OT);Wheelchair cushion (measurements OT) (wide-drop/arm bedside commode due to nature of injury)       Precautions / Restrictions Precautions Precautions: Fall Required Braces or Orthoses:  (wound vac and external fixator) Restrictions Weight Bearing Restrictions: Yes RLE Weight Bearing: Non weight bearing       Mobility Bed Mobility Overal bed mobility: Needs Assistance;+2 for physical assistance       Supine to sit: Min assist     General bed mobility comments: Pt up in recliner upon arrival  Transfers Overall transfer level: Needs assistance Equipment used: Rolling walker (2 wheeled) Transfers: Sit to/from Stand Sit to Stand: Min assist (for RLE)         General transfer comment: CUes for hand placement and positioning prior to sitting    Balance Overall balance assessment: Needs assistance Sitting-balance support: No upper extremity supported Sitting balance-Leahy Scale: Fair     Standing balance support: Bilateral upper extremity supported Standing balance-Leahy Scale: Poor                     ADL                           Toilet Transfer: Minimal assistance;+2 for safety/equipment;Ambulation;Requires wide/bariatric;Requires drop arm  (over toliet)   Toileting- Water quality scientist and Hygiene: Min guard;Sit to/from stand         General ADL Comments: Pt continues to tolerate foot strap on external fixator. Per pt/family that will be A'ing him at home the MD told them he could have it off a couple of hours a day and that when he has it off he needs to work on AROM of ankle fllexion and extension. I showed familly how they need to A him with donning and doffing foot strap. Family will A him with LBADLs until he can do them more for himself. Per family and patient the MD told them that the patient can get in the shower anytime. Familiy are going to get a tub bench for him.  I also recommended that they use a towel on the tub bench to help with ease of sliding in and out on bench. I also spoke with them about having something for patient to prop his RLE on when he is on the commode. I have contacted trauma CM to see if pt can get a wide/drop arm 3n1--otherwise the patient will need to defintely make sure his son gets his higher toilet in before he gets home.                 Cognition   Behavior During Therapy: WFL for tasks assessed/performed Overall Cognitive Status: Within Functional Limits for tasks assessed  Pertinent Vitals/ Pain       Pain Assessment: 0-10 Pain Score: 8  Pain Location: R leg Pain Intervention(s): Repositioned;Patient requesting pain meds-RN notified;RN gave pain meds during session         Frequency Min 2X/week     Progress Toward Goals  OT Goals(current goals can now be found in the care plan section)  Progress towards OT goals: progressing     Plan Discharge plan remains appropriate       End of Session Equipment Utilized During Treatment: Rolling walker   Activity Tolerance Patient limited by fatigue (but very fatigued up to bathroom)   Patient Left in chair;with call bell/phone within reach;with family/visitor present            Time: 7412-8786 OT Time Calculation (min): 66 min  Charges: OT General Charges $OT Visit: 1 Procedure OT Treatments $Self Care/Home Management : 53-67 mins  Almon Register [phone] 10/10/2013, 3:56 PM

## 2013-10-10 NOTE — Progress Notes (Signed)
Doing well and should be able to go home tomorrow.  This patient has been seen and I agree with the findings and treatment plan.  Kathryne Eriksson. Dahlia Bailiff, MD, [place] [phone] (pager) [phone] (direct pager) Trauma Surgeon

## 2013-10-10 NOTE — Progress Notes (Signed)
Patient ID: Bruce [name], male   DOB: 13-Jun-1945, 68 y.o.   MRN: [id number]   LOS: 4 days   Subjective: C/o nausea, hiccups, says this problem has been going on for >1 month but his omeprazole controls it.   Objective: Vital signs in last 24 hours: Temp:  [98.1 F (36.7 C)-103.1 F (39.5 C)] 99.8 F (37.7 C) (08/07 0537) Pulse Rate:  [89-131] 89 (08/07 0537) Resp:  [15-21] 16 (08/07 0537) BP: (91-150)/(52-126) 108/53 mmHg (08/07 0537) SpO2:  [93 %-100 %] 96 % (08/07 0537) Last BM Date:  (PTA)   Laboratory  CBC  Recent Labs  10/09/13 1630 10/10/13 0611  WBC 6.8 6.1  HGB 8.6* 8.3*  HCT 25.1* 24.4*  PLT 127* 135*    Physical Exam General appearance: alert and no distress Resp: clear to auscultation bilaterally Cardio: regular rate and rhythm GI: normal findings: bowel sounds normal and soft, non-tender Ext: NVI   Assessment/Plan: Log vs knee  R popliteal artery injury - S/P repair by Dr. Donnetta Hutching R tibial plateau FX/compartment syndrome - S/P ex fix by Dr. Marcelino Scot, will need ORIF in ~2 weeks  ABL anemia - Stable Asthma/Wheeze - better with duonebs  FEN -- Will increase Protonix VTE - Lovenox, left SCD  Dispo - PT/OT    Lisette Abu, PA-C Pager: [phone] General Trauma PA Pager: [phone]  10/10/2013

## 2013-10-10 NOTE — Progress Notes (Signed)
Orthopaedic Trauma Service Progress Note  Subjective  Doing well Pain controlled No specific complaints this am     Objective   BP 108/53  Pulse 89  Temp(Src) 99.8 F (37.7 C) (Oral)  Resp 16  Ht 6\' 1"  (1.854 m)  Wt 105.7 kg (233 lb 0.4 oz)  BMI 30.75 kg/m2  SpO2 96%  Intake/Output     08/06 0701 - 08/07 0700 08/07 0701 - 08/08 0700   P.O. 840 240   I.V. (mL/kg) 1100 (10.4)    Blood     Other     IV Piggyback 50    Total Intake(mL/kg) 1990 (18.8) 240 (2.3)   Urine (mL/kg/hr) 925 (0.4)    Drains     Blood 50 (0)    Total Output 975     Net +1015 +240        Urine Occurrence 1 x      Labs  Results for RAYLEE, ADAMEC (MRN [id number]) as of 10/10/2013 08:18  Ref. Range 10/10/2013 06:11  WBC Latest Range: 4.0-10.5 K/uL 6.1  RBC Latest Range: 4.22-5.81 MIL/uL 2.61 (L)  Hemoglobin Latest Range: 13.0-17.0 g/dL 8.3 (L)  HCT Latest Range: 39.0-52.0 % 24.4 (L)  MCV Latest Range: 78.0-100.0 fL 93.5  MCH Latest Range: 26.0-34.0 pg 31.8  MCHC Latest Range: 30.0-36.0 g/dL 34.0  RDW Latest Range: 11.5-15.5 % 14.4  Platelets Latest Range: 150-400 K/uL 135 (L)   Exam  Gen: awake and alert, NAD, resting comfortably in bed  Lungs: clear anterior fields Cardiac: s1 and s2 Abd: + BS, NTND Ext:         Right Lower Extremity               Ex fix stable             VAC with good suction- it is an incisional VAC              Swelling stable             Ext warm, + DP and PT pulses             EHL, FHL motor intact             Weak ankle extension and flexion               DPN, SPN sensation improving              TN sensation improved      Assessment and Plan   POD/HD#: 1  68 y/o male crush by tree limb to R leg   1. Crush injury   2. Closed R proximal tibial plateau fracture at trifurcation with vascular injury               S/p vascular repair, ex fix and fasciotomies             closed fasciotomies yesterday              NWB R leg for now             Elevate to  minimize swelling             Ice PRN             ACE from foot to thigh as well             continue with foot strap    PT/OT             Dressing change before  dc- will remove vacs at that time. Dressing to consist of mepitel, 4x4's, kerlix and ace   3. ABL anemia          continue to monitor   4. VTE             lovenox   Continue lovenox at dc as well   5. ID            will complete abx treatment today for open wounds   6. Dispo             PT/OT evals  Likely ready for dc over weekend   Follow up with ortho in 7-10 days   Plan for ORIF in 2 weeks     Jari Pigg, PA-C [facility] [phone] (P) 10/10/2013 8:18 AM  **Disclaimer: This note may have been dictated with voice recognition software. Similar sounding words can inadvertently be transcribed and this note may contain transcription errors which may not have been corrected upon publication of note.**

## 2013-10-10 NOTE — Progress Notes (Addendum)
    Subjective  - Doing well over all.  He has had a tape reaction in the right groin area.  Large broken blisters, bleeding.  Incision is healing fine.   Objective 108/53 89 99.8 F (37.7 C) (Oral) 16 96%  Intake/Output Summary (Last 24 hours) at 10/10/13 1158 Last data filed at 10/10/13 0803  Gross per 24 hour  Intake   1210 ml  Output    825 ml  Net    385 ml    Right groin incision healing well. Medial and lateral to the groin incision are large superficial broken blisters. Distally AROM of toes intact, skin warm, sensation of toes decreased.   Assessment/Planning: POD #4 Exploration popliteal artery, repair of popliteal artery transection with end-to-end reverse saphenous vein interposition graft  and  1. Closed reduction, right tibial plateau fracture.  2. Application of spanning external fixator, right leg.  3. Anterior and lateral compartment fasciotomies closed 4. Plan to be D/C'd Sunday with F/U plan for ORIF of the right tibial fracture repair in 2 weeks. 5.Local wound care to blisters-applied xeroform with ABD.  He has silvadene cream for home care of the blister areas. 6.F/U with Dr. Donnetta Hutching 3-4 weeks. 7. Social work consult to help with handicap placard form or Ortho can fill it out.  Thanks.     Laurence Slate [facility] 10/10/2013 11:58 AM --  Laboratory Lab Results:  Recent Labs  10/09/13 1630 10/10/13 0611  WBC 6.8 6.1  HGB 8.6* 8.3*  HCT 25.1* 24.4*  PLT 127* 135*   BMET  Recent Labs  10/08/13 0240 10/09/13 0320  NA 137 141  K 4.9 4.7  CL 104 106  CO2 22 25  GLUCOSE 114* 112*  BUN 14 15  CREATININE 0.95 0.97  CALCIUM 7.9* 8.0*    COAG Lab Results  Component Value Date   INR 1.28 10/06/2013   No results found for this basename: PTT      I have examined the patient, reviewed and agree with above. Stable from vascular standpoint. Will see back in the office in several weeks  Littleton Haub, MD 10/10/2013 3:24 PM

## 2013-10-10 NOTE — Progress Notes (Signed)
Pt was due to void by 2300. Pt states that he voided in the urinal and it was emptied but that he does not know how much was in it or who emptied it. Bladder scan showed 340cc urine in bladder. Pt willing to attempt to void on own again. Able to void 75cc in urinal and unmeasurable amount in bedpan. Bladder is not distended and pt has no complaints of discomfort. Nursing will continue to monitor.

## 2013-10-10 NOTE — Progress Notes (Addendum)
Spoke with patient about DME needs.  OT has recommended the heavy duty bedside commode. Patient was able to get a family member/friend to loan him one as he doesn't meet the weight requirements for a heavy duty (bariatric) one and medicare would not pay for it ($181).  Other recommended DME--wheelchair, tub bench, rolling walker all sent to [place] as that is the company we are to use for [facility]. The original fax is in my top drawer on 6N in the left side of the quiet room outside of rooms 18-19 if needed.  Address and phone number are correct. Pt's sig other--[name] [phone].  HHRN/OT arranged with [facility] as per insurance guidelines.   Medicare IM (Important Message) delivered to patient today by me in anticipation of discharge.   Sandi Mariscal, RN BSN MHA CCM  Case Manager, Trauma Service/Unit 49M [phone]

## 2013-10-10 NOTE — Telephone Encounter (Signed)
Message copied by Gena Fray on Fri Oct 10, 2013  3:14 PM ------      Message from: Mena Goes      Created: Fri Oct 10, 2013 12:26 PM      Regarding: Schedule                   ----- Message -----         From: Ulyses Amor, PA-C         Sent: 10/10/2013  12:06 PM           To: Vvs Charge Pool            F/U with Dr. Donnetta Hutching in 3 weeks s/p repair of popliteal artery post trauma. ------

## 2013-10-10 NOTE — Telephone Encounter (Addendum)
Message copied by Gena Fray on Fri Oct 10, 2013  3:08 PM ------      Message from: Mena Goes      Created: Fri Oct 10, 2013 12:26 PM      Regarding: Schedule                   ----- Message -----         From: Ulyses Amor, PA-C         Sent: 10/10/2013  12:06 PM           To: Vvs Charge Pool            F/U with Dr. Donnetta Hutching in 3 weeks s/p repair of popliteal artery post trauma. ------  10/10/13: spoke with pt, dpm

## 2013-10-10 NOTE — Telephone Encounter (Signed)
Spoke with pt, dpm °

## 2013-10-11 DIAGNOSIS — J45909 Unspecified asthma, uncomplicated: Secondary | ICD-10-CM

## 2013-10-11 MED ORDER — ONDANSETRON HCL 4 MG PO TABS: 4.0000 mg | ORAL_TABLET | Freq: Three times a day (TID) | ORAL | Status: DC | PRN

## 2013-10-11 MED ORDER — OXYCODONE-ACETAMINOPHEN 5-325 MG PO TABS: 1.0000 | ORAL_TABLET | ORAL | Status: DC | PRN

## 2013-10-11 MED ORDER — ENOXAPARIN SODIUM 30 MG/0.3ML ~~LOC~~ SOLN: 30.0000 mg | Freq: Two times a day (BID) | SUBCUTANEOUS | Status: DC

## 2013-10-11 NOTE — Progress Notes (Signed)
Patient being discharged home at this time with family support. 2 incision wound vacs removed at bedside with PA Doran Stabler. Incisional sutures in tact with minimal drainage. Mepilex/gauze applied to each incision. Ace wrap applied up the leg. Pin care site performed and Kerlex dressing applied around pin sites. Abd pads and gauze applied to groin area where blisters present. Patient educated on pin site care at home and dressing changes on his blisters. Home health will be provided by [facility]. Huey Romans will deliver DME to home this evening as discussed with case management. Patient provided with discharge instructions, follow up information, and prescriptions. He is going home at this time in private vehicle.
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
## 2013-10-11 NOTE — Care Management Note (Signed)
Page 1 of 2   10/11/2013     6:37:04 PM CARE MANAGEMENT NOTE 10/11/2013  Patient:  Bruce [name],Bruce [name]   Account Number:  [id number]  Date Initiated:  10/10/2013  Documentation initiated by:  Sandi Mariscal  Subjective/Objective Assessment:   vascular injury to leg     Action/Plan:   home with ex fix in place until it can be permanently fixed   Anticipated DC Date:  10/11/2013   Anticipated DC Plan:  [place]  CM consult      [facility] Choice  HOME HEALTH   Choice offered to / List presented to:  C-1 Patient   DME arranged  [place]      DME agency  [place]     HH arranged  HH-1 RN  HH-3 OT      [place].   Status of service:  Completed, signed off Medicare Important Message given?  YES (If response is "NO", the following Medicare IM given date fields will be blank) Date Medicare IM given:  10/10/2013 Medicare IM given by:  Sandi Mariscal Date Additional Medicare IM given:   Additional Medicare IM given by:    Discharge Disposition:  [place]  Per UR Regulation:  Reviewed for med. necessity/level of care/duration of stay  If discussed at [place] of Stay Meetings, dates discussed:    Comments:  10/11/13 09:15 CM received call from [place] asking if Huey Romans had any other questions concerning DME.  CM called [employer] and [employer] stated they had the orders for the wheelchair, cushion, walker, and tub bench . CM met with pt to confirm his DME should be delivered to his home and pt states he will have no trouble getting into the house and he did, indeed, want the DME to be delivered.  CM called [employer] back to confirm address and contact numbers for delivery. 18:00 CM received a call from the family, who is at home with the pt, and [employer] has not delivered DME. CM called [employer] and spoke with Dannette Lyndee Leo) who after 30 minutes states   two  "911s and an expedite" has been placed on this order and the DME will arrive at the pt's home this evening.  CM called the family back and told them I will be available in the morning if they have any other DME concerns.  No other CM needs were communicated.  Mariane Masters, [place], [place].     10/10/2013 1940 Contacted [employer] to follow up on delivery time of DME. States orders not received. Refaxed orders. Will need to refax RW order on 10/11/2013 once signed by MD or PA. Jonnie Finner RN CCM Case Mgmt phone [phone]  Angelina Sheriff, RN Case Manager Addendum CASE MANAGEMENT Progress Notes Service date: 10/10/2013 4:55 PM Spoke with patient about DME needs.  OT has recommended the heavy duty bedside commode. Patient was able to get a family member/friend to loan him one as he doesn't meet the weight requirements for a heavy duty (bariatric) one and medicare would not pay for it ($181).  Other recommended DME--wheelchair, tub bench, rolling walker all sent to [facility] as that is the company we are to use for [facility]. The original fax is in my top drawer on 6N in the left side of the quiet room outside of rooms 18-19 if needed.  Address and phone number are correct. Pt's sig other--Bruce [name] [phone].   HHRN/OT arranged with [facility] as per insurance guidelines.   Medicare IM (Important Message) delivered to patient today by me in anticipation of discharge.   Sandi Mariscal, RN BSN MHA CCM Case Manager, Trauma Service/Unit 34M [phone]

## 2013-10-11 NOTE — Progress Notes (Signed)
Physical Therapy Treatment Patient Details Name: Markeem Noreen MRN: [id number] DOB: 1945-08-11 Today's Date: 10/11/2013    History of Present Illness Patient is a 68 yo male with Severe ischemia right leg associated with popliteal artery injury and tib-fib fracture, now s/p CLOSED REDUCTION RIGHT TIBIAL PLATEAU FRACTURE, EXTERNAL FIXATION RIGHT LEG, PLACEMENT OF WOUND VAC (Right).    PT Comments    Patient is progressing well towards physical therapy goals, ambulating up to 25 feet with min guard assist. Able to safely transfer out of the bed with min assist for right LE support. Patient will continue to benefit from skilled physical therapy services at home with HHPT to further improve independence with functional mobility.    Follow Up Recommendations  Supervision/Assistance - 24 hour     Equipment Recommendations  Rolling walker with 5" wheels;3in1 (PT);Wheelchair (measurements PT);Wheelchair cushion (measurements PT)    Recommendations for Other Services       Precautions / Restrictions Restrictions Weight Bearing Restrictions: Yes RLE Weight Bearing: Non weight bearing    Mobility  Bed Mobility Overal bed mobility: Needs Assistance Bed Mobility: Supine to Sit     Supine to sit: Min assist     General bed mobility comments: Min assist for RLE support. Transfered to left side of bed similar to home environment. Requires extra time.  Transfers Overall transfer level: Needs assistance Equipment used: Rolling walker (2 wheeled) Transfers: Sit to/from Stand Sit to Stand: Min guard;From elevated surface         General transfer comment: VC for hand placement. Bed slightly elevated similar to bed at home per pt. Able to safely maintain NWB on RLE with minimal cues.  Ambulation/Gait Ambulation/Gait assistance: Min guard Ambulation Distance (Feet): 25 Feet Assistive device: Rolling walker (2 wheeled) Gait Pattern/deviations:  ("hop-to" pattern)   Gait velocity  interpretation: Below normal speed for age/gender General Gait Details: Required several standing rest breaks during bout due to fatigue. VC for walker placement. Intermittent cues for NWB on RLE as would occasionally set foot down to rest.   Stairs            Wheelchair Mobility    Modified Rankin (Stroke Patients Only)       Balance                                    Cognition Arousal/Alertness: Awake/alert Behavior During Therapy: WFL for tasks assessed/performed Overall Cognitive Status: Within Functional Limits for tasks assessed                      Exercises General Exercises - Lower Extremity Ankle Circles/Pumps: AROM;Left;Supine (toe flexion/ext on RLE) Gluteal Sets: Strengthening;Both;10 reps;Seated    General Comments        Pertinent Vitals/Pain Pain Assessment: 0-10 Pain Score: 7  Pain Location: right leg Pain Descriptors / Indicators: Constant Pain Intervention(s): Limited activity within patient's tolerance;Monitored during session;Repositioned    Home Living                      Prior Function            PT Goals (current goals can now be found in the care plan section) Acute Rehab PT Goals PT Goal Formulation: With patient Time For Goal Achievement: 10/21/13 Potential to Achieve Goals: Good Progress towards PT goals: Progressing toward goals    Frequency  Min 4X/week  PT Plan Current plan remains appropriate    Co-evaluation             End of Session   Activity Tolerance: Patient tolerated treatment well Patient left: in chair;with call bell/phone within reach     Time: 7858-8502 PT Time Calculation (min): 22 min  Charges:  $Gait Training: 8-22 mins                    G Codes:      Elayne Snare, [place]  Ellouise Newer 10/11/2013, 1:57 PM

## 2013-10-11 NOTE — Progress Notes (Signed)
Subjective: 2 Days Post-Op Procedure(s) (LRB): IRRIGATION AND DEBRIDEMENT RIGHT LEG, CLOSURE  OF WOUNDS, PLACEMENT OF WOUND VAC ON EACH SIDE OF LEG (Right) Patient reports pain as 2 on 0-10 scale.  No nausea/vomiting, lightheadedness/dizziness.  Positive flatus but no bm as of yet.  Tolerating diet.  Eager to be d/c home today   Objective: Vital signs in last 24 hours: Temp:  [98.3 F (36.8 C)-98.6 F (37 C)] 98.6 F (37 C) (08/08 0614) Pulse Rate:  [83-94] 87 (08/08 0614) Resp:  [16] 16 (08/08 0614) BP: (101-111)/(59-67) 111/65 mmHg (08/08 0614) SpO2:  [93 %-95 %] 95 % (08/08 0614)  Intake/Output from previous day: 08/07 0701 - 08/08 0700 In: 720 [P.O.:720] Out: 500 [Urine:500] Intake/Output this shift:     Recent Labs  10/08/13 1500 10/09/13 0320 10/09/13 1630 10/10/13 0611  HGB 10.1* 9.1* 8.6* 8.3*    Recent Labs  10/09/13 1630 10/10/13 0611  WBC 6.8 6.1  RBC 2.70* 2.61*  HCT 25.1* 24.4*  PLT 127* 135*    Recent Labs  10/09/13 0320  NA 141  K 4.7  CL 106  CO2 25  BUN 15  CREATININE 0.97  GLUCOSE 112*  CALCIUM 8.0*   No results found for this basename: LABPT, INR,  in the last 72 hours  Neurologically intact ABD soft Neurovascular intact Sensation intact distally Intact pulses distally Dorsiflexion/Plantar flexion intact Compartment soft Moderate drainage from right groin blisters as well as wound vac  Assessment/Plan: 2 Days Post-Op Procedure(s) (LRB): IRRIGATION AND DEBRIDEMENT RIGHT LEG, CLOSURE  OF WOUNDS, PLACEMENT OF WOUND VAC ON EACH SIDE OF LEG (Right) Advance diet D/C IV fluids Discharge home with home health RLE: NWB.  Ice and elevate as much as possible Keep ace bandage wrapped from right foot to right thigh Keep right groin blisters covered with silvadene cream, xeroform, 4x4's and abd pads.   Will d/c home on Lovenox and percocet D/c home with RLE ex fix.  Will fu with Dr. Marcelino Scot for ORIF right tibial plateau fxr in 2  weeks Will remove incisional vac today prior to d/c   ANTON, M. LINDSEY 10/11/2013, 11:03 AM

## 2013-10-11 NOTE — Discharge Summary (Signed)
Patient ID: Bruce [name] MRN: [id number] DOB/AGE: 1946/01/19 68 y.o.  Admit date: 10/06/2013 Discharge date: 10/11/2013  Admission Diagnoses:  Active Problems:   Tibia/fibula fracture   Blunt trauma of lower leg   Traumatic compartment syndrome   Acute blood loss anemia   Anticoagulated   Discharge Diagnoses:  Same  Past Medical History  Diagnosis Date  . Hypertension   . Myocardial infarction     Surgeries: Procedure(s): IRRIGATION AND DEBRIDEMENT RIGHT LEG, CLOSURE  OF WOUNDS, PLACEMENT OF WOUND VAC ON EACH SIDE OF LEG on 10/06/2013 - 10/09/2013   Consultants: Treatment Team:  Rozanna Box, MD Rosetta Posner, MD  Discharged Condition: Improved  Hospital Course: Bruce [name] is an 68 y.o. male who was admitted 10/06/2013 for operative treatment of right tibial plateau fracture and compartment syndrome. Patient has severe unremitting pain that affects sleep, daily activities, and work/hobbies. After pre-op clearance the patient was taken to the operating room on 10/06/2013 - 10/09/2013 and underwent  Procedure(s): IRRIGATION AND DEBRIDEMENT RIGHT LEG, CLOSURE  OF WOUNDS, PLACEMENT OF WOUND VAC ON EACH SIDE OF LEG.  Patient developed ABLA during hospital course but is currently asymptomatic.  Will continue to follow.    Patient was given perioperative antibiotics: Anti-infectives   Start     Dose/Rate Route Frequency Ordered Stop   10/09/13 1800  ceFAZolin (ANCEF) IVPB 1 g/50 mL premix     1 g 100 mL/hr over 30 Minutes Intravenous Every 8 hours 10/09/13 1124 10/10/13 1141   10/07/13 1000  ceFAZolin (ANCEF) IVPB 1 g/50 mL premix  Status:  Discontinued     1 g 100 mL/hr over 30 Minutes Intravenous Every 8 hours 10/07/13 0857 10/09/13 1124       Patient was given sequential compression devices, early ambulation, and chemoprophylaxis to prevent DVT.  Patient benefited maximally from hospital stay and there were no complications.    Recent vital signs: Patient Vitals for the past 24  hrs:  BP Temp Temp src Pulse Resp SpO2  10/11/13 0614 111/65 mmHg 98.6 F (37 C) Oral 87 16 95 %  10/11/13 0207 108/67 mmHg 98.6 F (37 C) Oral 83 16 93 %  10/10/13 2103 101/62 mmHg 98.6 F (37 C) Oral 91 16 93 %  10/10/13 1342 107/59 mmHg 98.3 F (36.8 C) Oral 94 16 94 %     Recent laboratory studies:  Recent Labs  10/09/13 0320 10/09/13 1630 10/10/13 0611  WBC 10.2 6.8 6.1  HGB 9.1* 8.6* 8.3*  HCT 26.5* 25.1* 24.4*  PLT 125* 127* 135*  NA 141  --   --   K 4.7  --   --   CL 106  --   --   CO2 25  --   --   BUN 15  --   --   CREATININE 0.97  --   --   GLUCOSE 112*  --   --   CALCIUM 8.0*  --   --      Discharge Medications:     Medication List    STOP taking these medications       aspirin EC 81 MG tablet     ibuprofen 200 MG tablet  Commonly known as:  ADVIL,MOTRIN     rivaroxaban 20 MG Tabs tablet  Commonly known as:  XARELTO      TAKE these medications       ALPRAZolam 1 MG tablet  Commonly known as:  XANAX  Take 1 mg by mouth 2 (  two) times daily as needed for anxiety.     atorvastatin 80 MG tablet  Commonly known as:  LIPITOR  Take 80 mg by mouth daily.     benazepril-hydrochlorthiazide 20-12.5 MG per tablet  Commonly known as:  LOTENSIN HCT  Take 1 tablet by mouth daily.     furosemide 20 MG tablet  Commonly known as:  LASIX  Take 20 mg by mouth daily.     isosorbide mononitrate 60 MG 24 hr tablet  Commonly known as:  IMDUR  Take 60 mg by mouth daily.     loratadine 10 MG tablet  Commonly known as:  CLARITIN  Take 10 mg by mouth daily.     metoprolol succinate 50 MG 24 hr tablet  Commonly known as:  TOPROL-XL  Take 50 mg by mouth daily. Take with or immediately following a meal.     multivitamin with minerals Tabs tablet  Take 1 tablet by mouth daily.     nitroGLYCERIN 0.4 MG SL tablet  Commonly known as:  NITROSTAT  Place 0.4 mg under the tongue every other day.     omeprazole 20 MG tablet  Commonly known as:  PRILOSEC OTC   Take 20 mg by mouth daily.     OVER THE COUNTER MEDICATION  Take 1 tablet by mouth 3 (three) times daily. PROSTATE SUPPLEMENT WITH SAW PALMETTO     QNASL 80 MCG/ACT Aers  Generic drug:  Beclomethasone Dipropionate  Place 1 spray into the nose daily as needed (for congestion).     traMADol 50 MG tablet  Commonly known as:  ULTRAM  Take 50 mg by mouth every 6 (six) hours as needed for moderate pain.        Diagnostic Studies: Ct Head Wo Contrast  10/06/2013   CLINICAL DATA:  Trauma.  EXAM: CT HEAD WITHOUT CONTRAST  CT CERVICAL SPINE WITHOUT CONTRAST  TECHNIQUE: Multidetector CT imaging of the head and cervical spine was performed following the standard protocol without intravenous contrast. Multiplanar CT image reconstructions of the cervical spine were also generated.  COMPARISON:  None.  FINDINGS: CT HEAD FINDINGS  No skull fracture or intracranial hemorrhage.  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  Fat associated with choroid without evidence of intracranial mass lesion noted on this unenhanced exam.  No hydrocephalus.  Asymmetric appearance of the nasal bones with lucency involving the larger left nasal bone. If the patient is tender here, acute fracture not excluded although I suspect this is remote in appearance.  Partial opacification ethmoid sinus air cells. Visualized orbital structures unremarkable.  CT CERVICAL SPINE FINDINGS  No cervical spine fracture or malalignment. No abnormal prevertebral soft tissue swelling.  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  If ligamentous injury or cord injury were of high clinical concern, MR imaging could be obtained for further delineation.  Apical lung parenchymal changes may represent scarring however, 6.2 mm nodular appearance left lung apex. Given risk factors for bronchogenic carcinoma, follow-up chest CT at 6 - 12 months is  recommended. This recommendation follows the consensus statement: Guidelines for Management of Small Pulmonary Nodules Detected on CT Scans: A Statement from the [place] as published in Radiology 2005;237:395-400.  IMPRESSION: CT HEAD  No skull fracture or intracranial hemorrhage.  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  Asymmetric appearance of the nasal bones with lucency involving the larger left nasal  bone. If the patient is tender here, acute fracture not excluded although I suspect this is remote in appearance.  Partial opacification ethmoid sinus air cells.  CT CERVICAL SPINE  No cervical spine fracture or malalignment. No abnormal prevertebral soft tissue swelling.  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  6.2 mm nodule left lung apex. Given risk factors for bronchogenic carcinoma, follow-up chest CT at 6 - 12 months is recommended. This recommendation follows the consensus statement: Guidelines for Management of Small Pulmonary Nodules Detected on CT Scans: A Statement from the [place] as published in Radiology 2005;237:395-400.   Electronically Signed   By: Chauncey Cruel M.D.   On: 10/06/2013 15:33   Ct Cervical Spine Wo Contrast  10/06/2013   CLINICAL DATA:  Trauma.  EXAM: CT HEAD WITHOUT CONTRAST  CT CERVICAL SPINE WITHOUT CONTRAST  TECHNIQUE: Multidetector CT imaging of the head and cervical spine was performed following the standard protocol without intravenous contrast. Multiplanar CT image reconstructions of the cervical spine were also generated.  COMPARISON:  None.  FINDINGS: CT HEAD FINDINGS  No skull fracture or intracranial hemorrhage.  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  Fat associated with choroid without evidence of intracranial mass lesion noted on this unenhanced exam.  No hydrocephalus.  Asymmetric  appearance of the nasal bones with lucency involving the larger left nasal bone. If the patient is tender here, acute fracture not excluded although I suspect this is remote in appearance.  Partial opacification ethmoid sinus air cells. Visualized orbital structures unremarkable.  CT CERVICAL SPINE FINDINGS  No cervical spine fracture or malalignment. No abnormal prevertebral soft tissue swelling.  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  If ligamentous injury or cord injury were of high clinical concern, MR imaging could be obtained for further delineation.  Apical lung parenchymal changes may represent scarring however, 6.2 mm nodular appearance left lung apex. Given risk factors for bronchogenic carcinoma, follow-up chest CT at 6 - 12 months is recommended. This recommendation follows the consensus statement: Guidelines for Management of Small Pulmonary Nodules Detected on CT Scans: A Statement from the [place] as published in Radiology 2005;237:395-400.  IMPRESSION: CT HEAD  No skull fracture or intracranial hemorrhage.  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  Asymmetric appearance of the nasal bones with lucency involving the larger left nasal bone. If the patient is tender here, acute fracture not excluded although I suspect this is remote in appearance.  Partial opacification ethmoid sinus air cells.  CT CERVICAL SPINE  No cervical spine fracture or malalignment. No abnormal prevertebral soft tissue swelling.  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  6.2 mm nodule left lung apex. Given risk factors for bronchogenic carcinoma, follow-up chest CT at 6 - 12 months is recommended. This recommendation follows the consensus statement: Guidelines for Management of Small Pulmonary Nodules Detected on CT  Scans: A Statement from the [place] as published in Radiology 2005;237:395-400.   Electronically Signed   By: Chauncey Cruel M.D.   On: 10/06/2013 15:33   Ct Knee Right Wo Contrast  10/10/2013   CLINICAL DATA:  Tibial plateau fracture. Nonspecific (abnormal) findings on radiological and other examination of musculoskeletal sysem.  EXAM: CT OF THE RIGHT KNEE WITHOUT CONTRAST; 3-DIMENSIONAL CT IMAGE RENDERING  ON ACQUISITION WORKSTATION  TECHNIQUE: Multidetector CT imaging of the knee was performed according to the standard protocol. Multiplanar CT image reconstructions were also generated. 3-dimensional CT images were rendered by post-processing of the original CT data on an acquisition workstation. The 3-dimensional CT images were interpreted and findings were reported in the accompanying complete CT report for this study  COMPARISON:  None.  FINDINGS: There is a comminuted bicondylar right tibial plateau fracture. The lateral tibial plateau component involves the articular surface with 2.9 mm of step-off and 8.9 mm of distraction between the fracture fragments along the posterior aspect. The longitudinal fracture cleft extends to the tibial eminence. The medial tibial fracture involves the knee tibial metaphysis without involvement of the articular surface. There is a fracture cleft root extends to just inferior lateral to the tibial tuberosity at the patellar tendon insertion.  The fracture involves the tibial eminence at the ACL insertion. There is a anteriorly displaced small fracture fragment arising from the anterior lateral tibial plateau.  There is a small fracture of the proximal fibular head. There is no other fracture or dislocation.  There is a small joint effusion. There is generalized soft tissue edema around the knee joint. There is no focal fluid collection, hematoma or soft tissue emphysema.  IMPRESSION: 1. Comminuted bicondylar right tibial plateau fracture as described above.  2.  Small  fracture of the proximal fibular head.   Electronically Signed   By: Kathreen Devoid   On: 10/10/2013 10:52   Ct Angio Ao+bifem W/cm &/or Wo/cm  10/06/2013   ADDENDUM REPORT: 10/06/2013 16:04  ADDENDUM: Critical Value/emergent results were called by telephone at the time of interpretation on 10/06/2013 at 4:04 pm to Dr. Silvestre Gunner , who verbally acknowledged these results.   Electronically Signed   By: Markus Daft M.D.   On: 10/06/2013 16:04   10/06/2013   CLINICAL DATA:  Trauma to the right lower extremity. Patient complains of right lower extremity pain and pulseless right foot.  EXAM: CT ANGIOGRAPHY OF ABDOMINAL AORTA WITH ILIOFEMORAL RUNOFF  TECHNIQUE: Multidetector CT imaging of the abdomen, pelvis and lower extremities was performed using the standard protocol during bolus administration of intravenous contrast. Multiplanar CT image reconstructions and MIPs were obtained to evaluate the vascular anatomy.  CONTRAST:  100 mL Omnipaque 350  COMPARISON:  Right knee radiographs 10/06/2013  FINDINGS: Aorta: The abdominal aorta is patent without aneurysm or dissection. Mild atherosclerotic disease in the abdominal aorta. Celiac trunk and main branches are patent. Narrowing of the proximal celiac trunk is most likely related to the median arcuate ligament. The superior mesenteric artery is widely patent. Bilateral renal arteries are widely patent. The inferior mesenteric artery is patent.  Right Lower Extremity: The right common, internal and external iliac arteries are patent with mild atherosclerotic disease. The right common femoral artery is patent. The proximal right profunda femoral arteries are patent. The right superficial femoral artery is patent. 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 There is no flow in the distal popliteal artery or right calf arteries. There is a comminuted and displaced fracture of the proximal tibia with lateral displacement. Fracture involves the tibial plateau.  The displaced tibial fracture is situated along the expected course of the right popliteal artery. There is diffuse swelling with blood products in the distal thigh and knee. The fibula appears to be intact. No definite fracture of the distal femur.  Left Lower Extremity: The left common, internal and external iliac arteries are patent.  The left common femoral artery is patent. The left profunda femoral arteries are patent. Left superficial femoral artery is patent. Left popliteal artery is patent. There is 3 vessel runoff in the proximal left calf. Limited evaluation of the arterial structures in the mid calf.  Other findings: Lung bases are clear. No evidence for free intra-abdominal air. No gross abnormality to the liver. The gallbladder has been removed. Evaluation of the intra-abdominal structures limited due to the arterial phase of contrast. Normal appearance of the adrenal glands, pancreas and spleen. There are large stones in the left kidney mid pole. Largest stone measures up to 2.0 cm. Normal appearance of the right kidney. No significant free fluid or lymphadenopathy. No gross abnormality to the prostate or urinary bladder. There is prominent fat at the ileocecal valve. Cannot exclude mild small colonic lipomas. Normal appearance of the appendix and small bowel. No acute bone abnormality in the pelvis or visualized spine. Degenerate facet disease in the lumbar spine.  Review of the MIP images confirms the above findings.  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 This is a traumatic arterial injury related to the displaced proximal tibial fracture. No evidence for contrast extravasation or active bleeding.  Narrowing of the celiac trunk is most likely related to the median arcuate ligament.  Nonobstructive left renal calculi.  Electronically Signed: By: Markus Daft M.D. On: 10/06/2013 15:57   Dg Pelvis  Portable  10/06/2013   CLINICAL DATA:  Pain post trauma  EXAM: PORTABLE PELVIS 1-2 VIEWS  COMPARISON:  None.  FINDINGS: Note that there is overlying artifact making evaluation of portions of the pelvis less than optimal. There is no evidence of pelvic fracture or dislocation. Joint spaces appear intact. No erosive change.  IMPRESSION: No abnormality noted. Note that there are artifacts overlying portions of the pelvis making this evaluations somewhat less than optimal.   Electronically Signed   By: Lowella Grip M.D.   On: 10/06/2013 14:39   Ct 3d Recon At Scanner  10/10/2013   CLINICAL DATA:  Tibial plateau fracture. Nonspecific (abnormal) findings on radiological and other examination of musculoskeletal sysem.  EXAM: CT OF THE RIGHT KNEE WITHOUT CONTRAST; 3-DIMENSIONAL CT IMAGE RENDERING ON ACQUISITION WORKSTATION  TECHNIQUE: Multidetector CT imaging of the knee was performed according to the standard protocol. Multiplanar CT image reconstructions were also generated. 3-dimensional CT images were rendered by post-processing of the original CT data on an acquisition workstation. The 3-dimensional CT images were interpreted and findings were reported in the accompanying complete CT report for this study  COMPARISON:  None.  FINDINGS: There is a comminuted bicondylar right tibial plateau fracture. The lateral tibial plateau component involves the articular surface with 2.9 mm of step-off and 8.9 mm of distraction between the fracture fragments along the posterior aspect. The longitudinal fracture cleft extends to the tibial eminence. The medial tibial fracture involves the knee tibial metaphysis without involvement of the articular surface. There is a fracture cleft root extends to just inferior lateral to the tibial tuberosity at the patellar tendon insertion.  The fracture involves the tibial eminence at the ACL insertion. There is a anteriorly displaced small fracture fragment arising from the anterior  lateral tibial plateau.  There is a small fracture of the proximal fibular head. There is no other fracture or dislocation.  There is a small joint effusion. There is generalized soft tissue edema around the knee joint. There is no  focal fluid collection, hematoma or soft tissue emphysema.  IMPRESSION: 1. Comminuted bicondylar right tibial plateau fracture as described above.  2.  Small fracture of the proximal fibular head.   Electronically Signed   By: Kathreen Devoid   On: 10/10/2013 10:52   Dg Chest Port 1 View  10/06/2013   CLINICAL DATA:  Trauma.  EXAM: PORTABLE CHEST - 1 VIEW  COMPARISON:  None.  FINDINGS: Incomplete examination in that the lung apices are not included on present examination. Taking this limitation into account, no gross pneumothorax is detected.  Mediastinum appears prominent. This may be related to AP magnification although mediastinal injury cannot be excluded by plain film exam.  Heart size within normal limits.  Central pulmonary vascular prominence without pulmonary edema.  Mild degenerative changes thoracic spine. No obvious burst fracture. No obvious rib fracture.  IMPRESSION: Incomplete examination in that the lung apices are not included on present examination. Taking this limitation into account, no gross pneumothorax is detected.  Mediastinum appears prominent. This may be related to AP magnification although mediastinal injury cannot be excluded by plain film exam.   Electronically Signed   By: Chauncey Cruel M.D.   On: 10/06/2013 14:41   Dg Knee Right Port  10/06/2013   CLINICAL DATA:  Trauma.  EXAM: PORTABLE RIGHT KNEE - 1-2 VIEW  COMPARISON:  None.  FINDINGS: Single frontal radiograph of the knee. Comminuted depressed tibial plateau fracture with impaction. No destructive bony lesions. Limited assessment for dislocation. Patient is on a trauma board.  IMPRESSION: Limited frontal radiograph of the knee demonstrating comminuted impacted tibial plateau fracture.   Electronically  Signed   By: Elon Alas   On: 10/06/2013 14:40   Dg C-arm 1-60 Min  10/06/2013   CLINICAL DATA:  Intraoperative.  EXAM: DG C-ARM 61-120 MIN  TECHNIQUE: Single lateral intraoperative fluoroscopic spot view of the right knee, radiologist was not present.  FLUOROSCOPY TIME:  .1 (unit of time not provided)  COMPARISON:  Right knee radiograph October 06, 2013 at 1415 hr  FINDINGS: Tibial plateau fracture.  IMPRESSION: Single lateral fluoroscopic spot view demonstrating tibial plateau fracture.   Electronically Signed   By: Elon Alas   On: 10/06/2013 18:00   Dg Knee 2 Views Right  10/06/2013   CLINICAL DATA:  Comminuted, depressed tibial plateau fracture.  EXAM: RIGHT KNEE - 3 VIEW  COMPARISON:  Earlier today.  FINDINGS: Previously demonstrated comminuted tibial plateau fracture. Significantly improved position and alignment. Currently, there is depression of the lateral tibial plateau.  IMPRESSION: Significantly improved position and alignment of a tibial plateau fracture with depression of the lateral tibial plateau currently demonstrated.   Electronically Signed   By: Enrique Sack M.D.   On: 10/06/2013 19:40    Disposition: Final discharge disposition not confirmed        Follow-up Information   Follow up with Rozanna Box, MD. Schedule an appointment as soon as possible for a visit in 10 days. (for recheck )    Specialty:  Orthopedic Surgery   Contact information:   [place] [address] [phone]       Follow up with EARLY, TODD, MD In 3 weeks. (sent message to office)    Specialty:  Vascular Surgery   Contact information:   [address] [place] [address] [phone]       Follow up with [facility]. (Home Health RN and Occupational Therapy)    Contact information:  [address] [place] [address] [phone]       Follow up with Rozanna Box, MD. Schedule an appointment as soon as possible for a  visit in 7 days.   Specialty:  Orthopedic Surgery   Contact information:   [address] [address] [phone]        Signed: Larae Grooms 10/11/2013, 11:18 AM

## 2013-10-13 NOTE — Care Management Note (Signed)
10/13/13  0915 - 10:10am Rudy Jew BSN Case Manager CM received call from patient's wife concerning DME. No equipment has been delivered as of this time. Case manager contacted [employer] and was informed that the walker and seat cushion would be delivered today, and the wheelchair would be delivered on Wednesday. CM explained that this isnt acceptable, CM contacted Ruby Cola with [employer] at 0945 to see if they can provide the DME today. I wsa assured that they can get wheelchair, walker and tub bench to the patient's home today. Orders faxed to [facility] @ [phone]. Patient was notified that company was changed and that they will receive equipment today. CM called Called [employer] and informed them that due to the delay in providing DME, we had to change companies.

## 2013-10-24 ENCOUNTER — Telehealth: Payer: Self-pay | Admitting: *Deleted

## 2013-10-24 NOTE — Telephone Encounter (Signed)
error 

## 2013-11-03 ENCOUNTER — Encounter: Payer: Self-pay | Admitting: Vascular Surgery

## 2013-11-04 ENCOUNTER — Encounter: Payer: Medicare HMO | Admitting: Vascular Surgery

## 2013-11-17 ENCOUNTER — Encounter: Payer: Self-pay | Admitting: Vascular Surgery

## 2013-11-18 ENCOUNTER — Encounter: Payer: Self-pay | Admitting: Cardiovascular Disease

## 2013-11-18 ENCOUNTER — Ambulatory Visit (INDEPENDENT_AMBULATORY_CARE_PROVIDER_SITE_OTHER): Payer: Self-pay | Admitting: Vascular Surgery

## 2013-11-18 ENCOUNTER — Encounter: Payer: Self-pay | Admitting: Vascular Surgery

## 2013-11-18 VITALS — BP 94/62 | HR 97 | Resp 18 | Ht 73.0 in | Wt 200.0 lb

## 2013-11-18 DIAGNOSIS — I999 Unspecified disorder of circulatory system: Secondary | ICD-10-CM

## 2013-11-18 DIAGNOSIS — I998 Other disorder of circulatory system: Secondary | ICD-10-CM | POA: Insufficient documentation

## 2013-11-18 NOTE — Progress Notes (Signed)
Today for followup of traumatic popliteal artery transection on 10/06/2013. Patient had a severe or blunt trauma to his right knee was transection of the popliteal artery from bone shards from tibial-fibular fracture fracture.Marland Kitchen  He continues to have the external fixator in place. He is making recovery. He looks quite good today. He does have external fixator and compression dressings over his ankle to the knee. He does have 2-3+ posterior tibial pulse. Foot is well perfused and he is regaining motor and sensory function in his foot.  Impression and plan by physical exam patient does have a patent interposition graft for popliteal artery injury with normal to 3+ posterior tibial pulse. He will continue to follow up with orthopedic surgery. He will see [place] in as-needed basis. I did discuss symptoms of foot ischemia venous notify us should this occur

## 2013-12-01 ENCOUNTER — Encounter (HOSPITAL_COMMUNITY): Payer: Self-pay | Admitting: *Deleted

## 2013-12-01 ENCOUNTER — Encounter (HOSPITAL_COMMUNITY): Payer: Self-pay | Admitting: Pharmacy Technician

## 2013-12-02 ENCOUNTER — Ambulatory Visit (HOSPITAL_COMMUNITY): Payer: Medicare HMO

## 2013-12-02 ENCOUNTER — Encounter (HOSPITAL_COMMUNITY): Payer: Self-pay | Admitting: *Deleted

## 2013-12-02 ENCOUNTER — Encounter (HOSPITAL_COMMUNITY)
Admission: RE | Disposition: A | Discharge status: Home / Self Care | Payer: Self-pay | Source: Ambulatory Visit | Attending: Orthopedic Surgery

## 2013-12-02 ENCOUNTER — Ambulatory Visit (HOSPITAL_COMMUNITY)
Admission: RE | Admit: 2013-12-02 | Discharge: 2013-12-02 | Disposition: A | Discharge status: Home / Self Care | Payer: Medicare HMO | Source: Ambulatory Visit | Attending: Orthopedic Surgery | Admitting: Orthopedic Surgery

## 2013-12-02 ENCOUNTER — Encounter (HOSPITAL_COMMUNITY): Payer: Medicare HMO | Admitting: Anesthesiology

## 2013-12-02 ENCOUNTER — Ambulatory Visit (HOSPITAL_COMMUNITY): Payer: Medicare HMO | Admitting: Anesthesiology

## 2013-12-02 DIAGNOSIS — I252 Old myocardial infarction: Secondary | ICD-10-CM | POA: Insufficient documentation

## 2013-12-02 DIAGNOSIS — E119 Type 2 diabetes mellitus without complications: Secondary | ICD-10-CM | POA: Insufficient documentation

## 2013-12-02 DIAGNOSIS — I739 Peripheral vascular disease, unspecified: Secondary | ICD-10-CM | POA: Diagnosis not present

## 2013-12-02 DIAGNOSIS — F411 Generalized anxiety disorder: Secondary | ICD-10-CM | POA: Diagnosis not present

## 2013-12-02 DIAGNOSIS — D649 Anemia, unspecified: Secondary | ICD-10-CM | POA: Diagnosis not present

## 2013-12-02 DIAGNOSIS — F17200 Nicotine dependence, unspecified, uncomplicated: Secondary | ICD-10-CM | POA: Diagnosis not present

## 2013-12-02 DIAGNOSIS — I2510 Atherosclerotic heart disease of native coronary artery without angina pectoris: Secondary | ICD-10-CM | POA: Diagnosis not present

## 2013-12-02 DIAGNOSIS — K219 Gastro-esophageal reflux disease without esophagitis: Secondary | ICD-10-CM | POA: Diagnosis not present

## 2013-12-02 DIAGNOSIS — Z7982 Long term (current) use of aspirin: Secondary | ICD-10-CM | POA: Insufficient documentation

## 2013-12-02 DIAGNOSIS — I10 Essential (primary) hypertension: Secondary | ICD-10-CM | POA: Diagnosis not present

## 2013-12-02 DIAGNOSIS — L97809 Non-pressure chronic ulcer of other part of unspecified lower leg with unspecified severity: Secondary | ICD-10-CM | POA: Diagnosis not present

## 2013-12-02 DIAGNOSIS — Z79899 Other long term (current) drug therapy: Secondary | ICD-10-CM | POA: Insufficient documentation

## 2013-12-02 DIAGNOSIS — Z4789 Encounter for other orthopedic aftercare: Secondary | ICD-10-CM | POA: Diagnosis not present

## 2013-12-02 DIAGNOSIS — S82141G Displaced bicondylar fracture of right tibia, subsequent encounter for closed fracture with delayed healing: Secondary | ICD-10-CM

## 2013-12-02 HISTORY — DX: Shortness of breath: R06.02

## 2013-12-02 HISTORY — DX: Peripheral vascular disease, unspecified: I73.9

## 2013-12-02 HISTORY — PX: HARDWARE REMOVAL: SHX979

## 2013-12-02 HISTORY — DX: Angina pectoris, unspecified: I20.9

## 2013-12-02 LAB — CBC
HCT: 38 % — ABNORMAL LOW (ref 39.0–52.0)
Hemoglobin: 12.3 g/dL — ABNORMAL LOW (ref 13.0–17.0)
MCH: 30 pg (ref 26.0–34.0)
MCHC: 32.4 g/dL (ref 30.0–36.0)
MCV: 92.7 fL (ref 78.0–100.0)
Platelets: 263 K/uL (ref 150–400)
RBC: 4.1 MIL/uL — ABNORMAL LOW (ref 4.22–5.81)
RDW: 14.3 % (ref 11.5–15.5)
WBC: 7.2 K/uL (ref 4.0–10.5)

## 2013-12-02 LAB — BASIC METABOLIC PANEL WITH GFR
Anion gap: 12 (ref 5–15)
BUN: 12 mg/dL (ref 6–23)
CO2: 26 meq/L (ref 19–32)
Calcium: 9.6 mg/dL (ref 8.4–10.5)
Chloride: 103 meq/L (ref 96–112)
Creatinine, Ser: 0.9 mg/dL (ref 0.50–1.35)
GFR calc Af Amer: >90 mL/min (ref >90)
GFR calc non Af Amer: 87 mL/min — ABNORMAL LOW (ref >90)
Glucose, Bld: 107 mg/dL — ABNORMAL HIGH (ref 70–99)
Potassium: 4.3 meq/L (ref 3.7–5.3)
Sodium: 141 meq/L (ref 137–147)

## 2013-12-02 SURGERY — REMOVAL, HARDWARE
Anesthesia: General | Site: Leg Lower | Laterality: Right

## 2013-12-02 MED ORDER — LACTATED RINGERS IV SOLN
INTRAVENOUS | Status: DC
  Administered 2013-12-02: 13:00:00 via INTRAVENOUS

## 2013-12-02 MED ORDER — FENTANYL CITRATE 0.05 MG/ML IJ SOLN
INTRAMUSCULAR | Status: AC
  Filled 2013-12-02: qty 5

## 2013-12-02 MED ORDER — PHENYLEPHRINE 40 MCG/ML (10ML) SYRINGE FOR IV PUSH (FOR BLOOD PRESSURE SUPPORT)
PREFILLED_SYRINGE | INTRAVENOUS | Status: AC
  Filled 2013-12-02: qty 10

## 2013-12-02 MED ORDER — CHLORHEXIDINE GLUCONATE 4 % EX LIQD
60.0000 mL | Freq: Once | CUTANEOUS | Status: DC
  Filled 2013-12-02: qty 60

## 2013-12-02 MED ORDER — PHENYLEPHRINE HCL 10 MG/ML IJ SOLN
INTRAMUSCULAR | Status: DC | PRN
  Administered 2013-12-02 (×2): 80 ug via INTRAVENOUS

## 2013-12-02 MED ORDER — ONDANSETRON HCL 4 MG/2ML IJ SOLN
INTRAMUSCULAR | Status: DC | PRN
  Administered 2013-12-02: 4 mg via INTRAVENOUS

## 2013-12-02 MED ORDER — ARTIFICIAL TEARS OP OINT
TOPICAL_OINTMENT | OPHTHALMIC | Status: DC | PRN
  Administered 2013-12-02: 1 via OPHTHALMIC

## 2013-12-02 MED ORDER — LIDOCAINE HCL (CARDIAC) 20 MG/ML IV SOLN
INTRAVENOUS | Status: DC | PRN
  Administered 2013-12-02: 80 mg via INTRAVENOUS

## 2013-12-02 MED ORDER — MIDAZOLAM HCL 5 MG/5ML IJ SOLN
INTRAMUSCULAR | Status: DC | PRN
  Administered 2013-12-02 (×2): 1 mg via INTRAVENOUS

## 2013-12-02 MED ORDER — DEXTROSE 5 % IV SOLN
INTRAVENOUS | Status: DC | PRN
  Administered 2013-12-02: 15:00:00 via INTRAVENOUS

## 2013-12-02 MED ORDER — PROMETHAZINE HCL 25 MG/ML IJ SOLN: 6.2500 mg | INTRAMUSCULAR | Status: DC | PRN

## 2013-12-02 MED ORDER — ONDANSETRON HCL 4 MG/2ML IJ SOLN
INTRAMUSCULAR | Status: AC
  Filled 2013-12-02: qty 2

## 2013-12-02 MED ORDER — CEFAZOLIN SODIUM-DEXTROSE 2-3 GM-% IV SOLR
2.0000 g | INTRAVENOUS | Status: AC
  Administered 2013-12-02: 2 g via INTRAVENOUS
  Filled 2013-12-02: qty 50

## 2013-12-02 MED ORDER — ARTIFICIAL TEARS OP OINT
TOPICAL_OINTMENT | OPHTHALMIC | Status: AC
  Filled 2013-12-02: qty 3.5

## 2013-12-02 MED ORDER — FENTANYL CITRATE 0.05 MG/ML IJ SOLN: 25.0000 ug | INTRAMUSCULAR | Status: DC | PRN

## 2013-12-02 MED ORDER — 0.9 % SODIUM CHLORIDE (POUR BTL) OPTIME
TOPICAL | Status: DC | PRN
  Administered 2013-12-02: 1000 mL

## 2013-12-02 MED ORDER — FENTANYL CITRATE 0.05 MG/ML IJ SOLN
INTRAMUSCULAR | Status: DC | PRN
  Administered 2013-12-02 (×2): 50 ug via INTRAVENOUS

## 2013-12-02 MED ORDER — PROPOFOL 10 MG/ML IV BOLUS
INTRAVENOUS | Status: AC
  Filled 2013-12-02: qty 20

## 2013-12-02 MED ORDER — LIDOCAINE HCL (CARDIAC) 20 MG/ML IV SOLN
INTRAVENOUS | Status: AC
  Filled 2013-12-02: qty 5

## 2013-12-02 MED ORDER — MIDAZOLAM HCL 2 MG/2ML IJ SOLN
INTRAMUSCULAR | Status: AC
  Filled 2013-12-02: qty 2

## 2013-12-02 MED ORDER — OXYCODONE HCL 5 MG PO TABS
5.0000 mg | ORAL_TABLET | Freq: Once | ORAL | Status: AC | PRN
  Administered 2013-12-02: 5 mg via ORAL

## 2013-12-02 MED ORDER — OXYCODONE HCL 5 MG/5ML PO SOLN: 5.0000 mg | Freq: Once | ORAL | Status: AC | PRN

## 2013-12-02 MED ORDER — LACTATED RINGERS IV SOLN
INTRAVENOUS | Status: DC | PRN
  Administered 2013-12-02: 14:00:00 via INTRAVENOUS

## 2013-12-02 MED ORDER — OXYCODONE HCL 5 MG PO TABS
ORAL_TABLET | ORAL | Status: AC
  Filled 2013-12-02: qty 1

## 2013-12-02 MED ORDER — PROPOFOL 10 MG/ML IV BOLUS
INTRAVENOUS | Status: DC | PRN
  Administered 2013-12-02: 120 mg via INTRAVENOUS

## 2013-12-02 SURGICAL SUPPLY — 62 items
BANDAGE ELASTIC 4 VELCRO ST LF (GAUZE/BANDAGES/DRESSINGS) ×3 IMPLANT
BANDAGE ELASTIC 6 VELCRO ST LF (GAUZE/BANDAGES/DRESSINGS) ×3 IMPLANT
BANDAGE ESMARK 6X9 LF (GAUZE/BANDAGES/DRESSINGS) ×1 IMPLANT
BNDG CMPR 9X6 STRL LF SNTH (GAUZE/BANDAGES/DRESSINGS)
BNDG COHESIVE 6X5 TAN STRL LF (GAUZE/BANDAGES/DRESSINGS) ×1 IMPLANT
BNDG ESMARK 6X9 LF (GAUZE/BANDAGES/DRESSINGS)
BNDG GAUZE ELAST 4 BULKY (GAUZE/BANDAGES/DRESSINGS) ×6 IMPLANT
BRUSH SCRUB DISP (MISCELLANEOUS) ×6 IMPLANT
CLEANER TIP ELECTROSURG 2X2 (MISCELLANEOUS) ×1 IMPLANT
CLOSURE WOUND 1/2 X4 (GAUZE/BANDAGES/DRESSINGS)
COVER SURGICAL LIGHT HANDLE (MISCELLANEOUS) ×6 IMPLANT
CUFF TOURNIQUET SINGLE 18IN (TOURNIQUET CUFF) IMPLANT
CUFF TOURNIQUET SINGLE 24IN (TOURNIQUET CUFF) IMPLANT
CUFF TOURNIQUET SINGLE 34IN LL (TOURNIQUET CUFF) IMPLANT
DRAPE C-ARM 42X72 X-RAY (DRAPES) IMPLANT
DRAPE C-ARMOR (DRAPES) ×1 IMPLANT
DRAPE OEC MINIVIEW 54X84 (DRAPES) ×1 IMPLANT
DRAPE U-SHAPE 47X51 STRL (DRAPES) ×1 IMPLANT
DRSG ADAPTIC 3X8 NADH LF (GAUZE/BANDAGES/DRESSINGS) ×1 IMPLANT
DRSG MEPITEL 4X7.2 (GAUZE/BANDAGES/DRESSINGS) ×2 IMPLANT
ELECT REM PT RETURN 9FT ADLT (ELECTROSURGICAL) ×3
ELECTRODE REM PT RTRN 9FT ADLT (ELECTROSURGICAL) ×1 IMPLANT
EVACUATOR 1/8 PVC DRAIN (DRAIN) IMPLANT
GAUZE SPONGE 4X4 12PLY STRL (GAUZE/BANDAGES/DRESSINGS) ×3 IMPLANT
GLOVE BIO SURGEON STRL SZ7.5 (GLOVE) ×3 IMPLANT
GLOVE BIO SURGEON STRL SZ8 (GLOVE) ×1 IMPLANT
GLOVE BIOGEL PI IND STRL 7.5 (GLOVE) ×1 IMPLANT
GLOVE BIOGEL PI IND STRL 8 (GLOVE) ×1 IMPLANT
GLOVE BIOGEL PI INDICATOR 7.5 (GLOVE) ×2
GLOVE BIOGEL PI INDICATOR 8 (GLOVE)
GOWN STRL REUS W/ TWL LRG LVL3 (GOWN DISPOSABLE) ×2 IMPLANT
GOWN STRL REUS W/ TWL XL LVL3 (GOWN DISPOSABLE) ×1 IMPLANT
GOWN STRL REUS W/TWL LRG LVL3 (GOWN DISPOSABLE) ×3
GOWN STRL REUS W/TWL XL LVL3 (GOWN DISPOSABLE)
KIT BASIN OR (CUSTOM PROCEDURE TRAY) ×3 IMPLANT
KIT ROOM TURNOVER OR (KITS) ×3 IMPLANT
MANIFOLD NEPTUNE II (INSTRUMENTS) ×1 IMPLANT
NEEDLE 22X1 1/2 (OR ONLY) (NEEDLE) IMPLANT
NS IRRIG 1000ML POUR BTL (IV SOLUTION) ×3 IMPLANT
PACK ORTHO EXTREMITY (CUSTOM PROCEDURE TRAY) ×3 IMPLANT
PAD ARMBOARD 7.5X6 YLW CONV (MISCELLANEOUS) ×6 IMPLANT
PADDING CAST COTTON 6X4 STRL (CAST SUPPLIES) ×3 IMPLANT
SPONGE LAP 18X18 X RAY DECT (DISPOSABLE) ×1 IMPLANT
SPONGE SCRUB IODOPHOR (GAUZE/BANDAGES/DRESSINGS) ×3 IMPLANT
STAPLER VISISTAT 35W (STAPLE) IMPLANT
STOCKINETTE IMPERVIOUS LG (DRAPES) ×1 IMPLANT
STRIP CLOSURE SKIN 1/2X4 (GAUZE/BANDAGES/DRESSINGS) IMPLANT
SUCTION FRAZIER TIP 10 FR DISP (SUCTIONS) IMPLANT
SUT ETHILON 3 0 PS 1 (SUTURE) IMPLANT
SUT PDS AB 2-0 CT1 27 (SUTURE) IMPLANT
SUT VIC AB 0 CT1 27 (SUTURE)
SUT VIC AB 0 CT1 27XBRD ANBCTR (SUTURE) IMPLANT
SUT VIC AB 2-0 CT1 27 (SUTURE)
SUT VIC AB 2-0 CT1 TAPERPNT 27 (SUTURE) IMPLANT
SYR CONTROL 10ML LL (SYRINGE) IMPLANT
TOWEL OR 17X24 6PK STRL BLUE (TOWEL DISPOSABLE) ×4 IMPLANT
TOWEL OR 17X26 10 PK STRL BLUE (TOWEL DISPOSABLE) ×3 IMPLANT
TUBE CONNECTING 12'X1/4 (SUCTIONS)
TUBE CONNECTING 12X1/4 (SUCTIONS) ×1 IMPLANT
UNDERPAD 30X30 INCONTINENT (UNDERPADS AND DIAPERS) ×5 IMPLANT
WATER STERILE IRR 1000ML POUR (IV SOLUTION) ×2 IMPLANT
YANKAUER SUCT BULB TIP NO VENT (SUCTIONS) ×1 IMPLANT

## 2013-12-02 NOTE — H&P (Signed)
[facility] H&P  Chief Complaint: retained Ex fix R leg HPI:   68 y/o male well known to [facility] after sustaining a severe injury to his R knee 10/2013 which required vascular repair and external fixation of his tibial plateau fracture.  We were hopeful to proceed with formal ORIF of his tibial plateau fracture but this did not transpire as his soft tissue swelling and injury never resolved sufficiently, as such pt was treated definitively in a spanning external fixator for 8 weeks. Pt presents today for removal of external fixator and manipulation of R knee under anesthesia   Past Medical History  Diagnosis Date  . Coronary artery disease   . Hypertension   . Anginal pain     Left side if chest ,NTG  relieves chaes apin  . Myocardial infarction     '09 AND '12  . Shortness of breath     With exertion .  Marland Kitchen Diabetes mellitus without complication   . Peripheral vascular disease     Past Surgical History  Procedure Laterality Date  . Ivc filter  2009    placed @ [facility]/ Removed in 2010.  Marland Kitchen External fixation leg Right 10/06/2013    Procedure: CLOSED REDUCTION RIGHT TIBIAL PLATEAU FRACTURE, EXTERNAL FIXATION RIGHT LEG, PLACEMENT OF WOUND VAC;  Surgeon: Rozanna Box, MD;  Location: [place];  Service: Orthopedics;  Laterality: Right;  . Femoral-popliteal bypass graft Right 10/06/2013    Procedure: RIGHT POPLITEAL-POPLITEAL ARTERY BYPASS GRAFT;  Surgeon: Rosetta Posner, MD;  Location: [place];  Service: Vascular;  Laterality: Right;  . I&d extremity Right 10/09/2013    Procedure: IRRIGATION AND DEBRIDEMENT RIGHT LEG, CLOSURE  OF WOUNDS, PLACEMENT OF WOUND VAC ON EACH SIDE OF LEG;  Surgeon: Rozanna Box, MD;  Location: [place];  Service: Orthopedics;  Laterality: Right;  . Cardiac catheterization    . Cholecystectomy    . Ligament leg Left     History reviewed. No pertinent family history. Social History:  reports that he has been smoking.  He does not have any smokeless tobacco history  on file. He reports that he does not drink alcohol or use illicit drugs.  Allergies: No Known Allergies   No current facility-administered medications on file prior to encounter.   Current Outpatient Prescriptions on File Prior to Encounter  Medication Sig Dispense Refill  . aspirin 81 MG EC tablet Take 81 mg by mouth 2 (two) times daily.       Marland Kitchen atorvastatin (LIPITOR) 80 MG tablet Take 80 mg by mouth daily.      . benazepril-hydrochlorthiazide (LOTENSIN HCT) 20-12.5 MG per tablet Take 1 tablet by mouth daily.      . furosemide (LASIX) 20 MG tablet Take 20 mg by mouth daily.      . isosorbide mononitrate (IMDUR) 60 MG 24 hr tablet Take 60 mg by mouth daily.      Marland Kitchen loratadine (CLARITIN) 10 MG tablet Take 10 mg by mouth daily.      . metoprolol succinate (TOPROL-XL) 50 MG 24 hr tablet Take 1 tablet (50 mg total) by mouth daily.  90 tablet  0  . nitroGLYCERIN (NITROSTAT) 0.4 MG SL tablet Place 0.4 mg under the tongue every other day.      Marland Kitchen omeprazole (PRILOSEC) 20 MG capsule Take 1 capsule (20 mg total) by mouth daily.  90 capsule  0  . oxyCODONE-acetaminophen (PERCOCET) 5-325 MG per tablet Take 1-2 tablets by mouth every 6 (six) hours as  needed (for pain).          No results found for this or any previous visit (from the past 48 hour(s)). No results found.  Review of Systems  Constitutional: Negative for fever and chills.  Respiratory: Negative for shortness of breath and wheezing.   Cardiovascular: Negative for chest pain and palpitations.  Gastrointestinal: Negative for nausea, vomiting and abdominal pain.  Neurological: Negative for tingling.    There were no vitals taken for this visit. Physical Exam  Constitutional: He is cooperative. No distress.  Cardiovascular: Normal rate, regular rhythm, S1 normal and S2 normal.   Respiratory: Effort normal.  Clear anterior fields   GI:  Soft, NTND, + BS   Musculoskeletal:  Right Lower Extremity    Post op wounds stable    Moderate swelling still present   Ext warm    + DP pulse   Distal motor and sensory functions intact   Pinsites look as expected- some ulceration at thigh      Neurological: He is alert.     Assessment/Plan  68 y/o male s/p ex fix R tibial plateau fx, treated definitively in ex fix   OR for Removal of ex fix  MUA R knee  Outpatient procedure vs Overnight observe to work with PT and go in [place] Will start some WB in a hinged brace  Outpatient PT  Jari Pigg, PA-C [facility] [phone] (P) 12/02/2013, 8:08 AM

## 2013-12-02 NOTE — H&P (Signed)
I have seen and examined the patient. I agree with the findings above.  I discussed with the patient the risks and benefits of surgery, including the possibility of loss of reduction, infection, nerve injury, vessel injury, wound breakdown, arthritis, DVT/ PE, failure to improve the loss of motion, and need for further surgery among others.  He understood these risks and wished to proceed.  Rozanna Box, MD 12/02/2013 2:34 PM

## 2013-12-02 NOTE — Anesthesia Preprocedure Evaluation (Addendum)
Anesthesia Evaluation  Patient identified by MRN, date of birth, ID band Patient awake    Reviewed: Allergy & Precautions, H&P , NPO status , Patient's Chart, lab work & pertinent test results, reviewed documented beta blocker date and time   Airway Mallampati: II TM Distance: >3 FB Neck ROM: Full    Dental  (+) Dental Advisory Given, Poor Dentition, Loose, Edentulous Upper   Pulmonary Current Smoker,  breath sounds clear to auscultation        Cardiovascular hypertension, Pt. on medications and Pt. on home beta blockers + CAD and + Peripheral Vascular Disease Rhythm:Regular Rate:Normal  Mild Diffuse CAD on 2014 cath.  4/15 TTE EF 55%. Normal valves.   Neuro/Psych Anxiety    GI/Hepatic GERD-  Controlled and Medicated,  Endo/Other  diabetes  Renal/GU      Musculoskeletal negative musculoskeletal ROS (+)   Abdominal   Peds  Hematology  (+) anemia ,   Anesthesia Other Findings Lowers are loose  Reproductive/Obstetrics                         Anesthesia Physical  Anesthesia Plan  ASA: III  Anesthesia Plan: General   Post-op Pain Management:    Induction: Intravenous  Airway Management Planned: Oral ETT and LMA  Additional Equipment:   Intra-op Plan:   Post-operative Plan: Extubation in OR  Informed Consent: I have reviewed the patients History and Physical, chart, labs and discussed the procedure including the risks, benefits and alternatives for the proposed anesthesia with the patient or authorized representative who has indicated his/her understanding and acceptance.   Dental advisory given  Plan Discussed with: CRNA, Anesthesiologist and Surgeon  Anesthesia Plan Comments: (   )       Anesthesia Quick Evaluation

## 2013-12-02 NOTE — Discharge Instructions (Signed)

## 2013-12-02 NOTE — Transfer of Care (Signed)
Immediate Anesthesia Transfer of Care Note  Patient: Bruce [name]  Procedure(s) Performed: Procedure(s): REMOVAL EXTERNAL FIXATION RIGHT LEG  (Right)  Patient Location: PACU  Anesthesia Type:General  Level of Consciousness: awake, alert , oriented and patient cooperative  Airway & Oxygen Therapy: Patient Spontanous Breathing and Patient connected to nasal cannula oxygen  Post-op Assessment: Report given to PACU RN, Post -op Vital signs reviewed and stable and Patient moving all extremities  Post vital signs: Reviewed and stable  Complications: No apparent anesthesia complications

## 2013-12-02 NOTE — Anesthesia Procedure Notes (Signed)
Procedure Name: LMA Insertion Date/Time: 12/02/2013 3:21 PM Performed by: Williemae Area B Pre-anesthesia Checklist: Emergency Drugs available, Patient identified, Suction available and Patient being monitored Patient Re-evaluated:Patient Re-evaluated prior to inductionOxygen Delivery Method: Circle system utilized Preoxygenation: Pre-oxygenation with 100% oxygen Intubation Type: IV induction Ventilation: Mask ventilation without difficulty LMA: LMA inserted LMA Size: 5.0 Number of attempts: 1 Placement Confirmation: breath sounds checked- equal and bilateral and positive ETCO2 Tube secured with: taped across cheeks. Dental Injury: Teeth and Oropharynx as per pre-operative assessment

## 2013-12-02 NOTE — Anesthesia Postprocedure Evaluation (Signed)
  Anesthesia Post-op Note  Patient: Bruce [name]  Procedure(s) Performed: Procedure(s): REMOVAL EXTERNAL FIXATION RIGHT LEG  (Right)  Patient Location: PACU  Anesthesia Type:General  Level of Consciousness: awake, alert  and oriented  Airway and Oxygen Therapy: Patient Spontanous Breathing  Post-op Pain: none  Post-op Assessment: Post-op Vital signs reviewed  Post-op Vital Signs: Reviewed  Last Vitals:  Filed Vitals:   12/02/13 1629  BP:   Pulse:   Temp: 36.4 C  Resp:     Complications: No apparent anesthesia complications

## 2013-12-03 ENCOUNTER — Encounter (HOSPITAL_COMMUNITY): Payer: Self-pay | Admitting: Orthopedic Surgery

## 2013-12-04 NOTE — Op Note (Signed)
Bruce [name], Bruce [name] NO.:  [id number]  MEDICAL RECORD NO.:  [id number]  LOCATION:                                 FACILITY:  PHYSICIAN:  Astrid Divine. Marcelino Scot, M.D. DATE OF BIRTH:  1946-01-08  DATE OF PROCEDURE:  12/02/2013 DATE OF DISCHARGE:  12/02/2013                              OPERATIVE REPORT   PREOPERATIVE DIAGNOSES: 1. Right bicondylar tibial plateau fracture status post fascia repair. 2. Retained external fixator. 3. Ulcerative pin sites.  POSTOPERATIVE DIAGNOSES: 1. Right bicondylar tibial plateau fracture status post fascia repair. 2. Retained external fixator. 3. Ulcerative pin sites.  PROCEDURES: 1. Removal of right leg external fixator under anesthesia. 2. Stress fluoroscopic evaluation of the tibial plateau under     anesthesia. 3. Manipulation of right knee under anesthesia. 4. Curettage of ulcerative pin sites, tibia and femur.  SURGEON:  Astrid Divine. Marcelino Scot, M.D.  ASSISTANT:  Jari Pigg, PA-C.  ANESTHESIA:  General.  COMPLICATIONS:  None.  DISPOSITION:  PACU.  CONDITION:  Stable.  BRIEF SUMMARY AND INDICATIONS FOR PROCEDURE:  Bruce [name] is a 68 year old male who sustained a crush injury to his right tibia resulting in complete vascular disruption requiring vascular repair spanning external fixation and initial plan for definitive reconstruction on delayed basis.  The patient's soft tissue condition from his fasciotomies and crush precluded operative repair and he is now 8 weeks out and presents for removal of the fixator and evaluation of the fracture under anesthesia with possible progression to weightbearing as tolerated and possible manipulation.  I discussed with him the risks and benefits of removal fixator including possibility of nonunion and loss of reduction, need for OR for further surgery, recurrence of contracture, DVT, PE, heart attack, stroke, and many others.  The patient acknowledged these risks and did wish  to proceed.  BRIEF SUMMARY OF PROCEDURE:  The patient was taken to the operating room where general anesthesia was induced.  He did receive preoperative antibiotics.  His right lower extremity then underwent thorough chlorhexidine scrub followed by removal of the external fixator.  C-arm was then brought in and while the leg was stabilized by my assistant, I performed a varus-valgus stress evaluation and this did demonstrate slight motion at the fracture site which correlated clinically.  I did not excessively stressed them.  We then were able to check under lateral fluoroscopic evaluation and performed a gentle flexion and extension achieving full extension of the knee and flexion to 50 and perhaps to 55 degrees without any fracture motion observable.  We did not press beyond this.  The patient then underwent a curettage of all of his pin sites with sequentially large to small curettes such that the outer more contaminated soft tissue tract was debrided prior to the bone.  Copious irrigation was performed and these ulcerative pin sites really appeared quite healthy.  After irrigation, sterile gently compressive nonadherent dressing was applied and Ace wrap from foot to thigh and then a knee immobilizer.  The patient was awakened from anesthesia and transported to PACU in stable condition.  Ainsley Spinner, PA-C did assist me throughout the procedure.  PROGNOSIS:  Bruce [name] will continue nonweightbearing  in his knee immobilizer though we expect to transition into a hinged knee brace with Gelfoam that would allow for gently progressive weightbearing with the assistance and supervision of physical therapy.  He can continue to shower and proceed with pin care as he has previously done.  We will plan to see him back in the office with new x-rays in 14 days.     Astrid Divine. Marcelino Scot, M.D.     MHH/MEDQ  D:  12/02/2013  T:  12/03/2013  Job:  [id number]

## 2014-11-13 ENCOUNTER — Other Ambulatory Visit: Payer: Self-pay | Admitting: Orthopedic Surgery

## 2014-11-13 DIAGNOSIS — S82141P Displaced bicondylar fracture of right tibia, subsequent encounter for closed fracture with malunion: Secondary | ICD-10-CM

## 2014-11-19 ENCOUNTER — Ambulatory Visit
Admission: RE | Admit: 2014-11-19 | Discharge: 2014-11-19 | Disposition: A | Discharge status: Home / Self Care | Payer: Medicare HMO | Source: Ambulatory Visit | Attending: Orthopedic Surgery | Admitting: Orthopedic Surgery

## 2014-11-19 DIAGNOSIS — S82141P Displaced bicondylar fracture of right tibia, subsequent encounter for closed fracture with malunion: Secondary | ICD-10-CM

## 2014-12-25 ENCOUNTER — Encounter (HOSPITAL_COMMUNITY): Payer: Self-pay | Admitting: *Deleted

## 2014-12-28 ENCOUNTER — Encounter (HOSPITAL_COMMUNITY): Payer: Self-pay | Admitting: Vascular Surgery

## 2014-12-28 NOTE — Progress Notes (Signed)
Anesthesia Chart Review: SAME DAY WORK-UP.  Patient is a 69 year old male scheduled for ORIF right tibia fracture, RIA vs ICBG tomorrow by Dr. Marcelino Scot. DX: Right proximal tibia non-union.  History includes RLE injury 10/06/13 while working an [employer] when a tree came into the cab and struck his right knee and sustained right tib-fib fracture and RLE ischemia d/t popliteal artery traumatic transection s/p closed reduction right tibial plateau fracture with external fixation and right popliteal-popliteal artery BPG 10/06/13 with I&D and wound closure 10/09/13 and removal of external fixation 12/02/13. Other history includes CAD/MI in '09 and 03/2010, HTN, PAD, DM2, exertional dyspnea, DVT s/p IVC filter '09 s/p removal '10, cholecystectomy.   Cardiologist is Dr. Neoma Laming with [facility]) in [place]. Patient had recent cardiac testing in 09/2014 (see below). Stress test was mildly abnormal. No chest pain at that time. Continued medical management recommended at the test result review appointment on 09/29/14. His last visit was on 11/20/14. No new testing ordered at that time. Cardiac cath in 2012 and coronary CT in 2014 showed only mild CAD.   Meds listed include Xanax, Lipitor, Veramyst, Lasix, Imdur, Toprol XL, Nitro, Prilosec, Percocet, Xarelto, tramadol. He reported last dose 12/25/14. I confirmed with Gwen at Dr. Carlean Jews office that Dr. Humphrey Rolls is aware of surgery plans as he had given pre-operative instructions regarding holding Xarelto (his staff called and spoke with Dr. Carlean Jews staff).   EKG faxed from [facility] was machine dated 05/21/93, although I feel it was likely done more recently. If we office is not able to confirm that the tracing was done within the past year then he will need another EKG on arrival.    09/29/14 [facility]): Technically difficult study with suboptimal windows. Mildly dilated LA. Normal LV systolic function. LVEF 60%. Normal wall motion, Mild LVH. Grade 1  (relaxation abnormality) diastolic dysfunction. Trace to mild MR/TR.   09/23/14 Nuclear stress test [facility]): Equivocal stress test with normal LVEF 55%. Small mild anteroapical and septal defects with partial reversibility. Medium sized moderate intensity fixed inferior wall defect.   09/29/14 BLE venous doppler (prelim; AMA): Right: CFV, GSV (iwth partial compression and no augmentation performed), FV distal have long, non-obstructing plaque. All segments are compressible and augmented. Left: All segments are compressible and augmented.  Previous cardiac studies scanned under the Media tab, Correspondence include:  - 08/27/12 Nuclear Stress Test [facility]): Medium sized fixed inferior wall defect, EF 66%. Defect most likely due to diaphragmatic attenuation artifact with normal LVEF will correlate clinically, consider CCTA.  - 09/20/12 CCTA [facility]):  Proximal and distal CX < 25% stenosis, OM1 < 25% stenosis. LM, LAD, RCA, PDA were classified as "normal 0" which correlated with absence of plaque and no luminal stenosis. Conclusion: Calcium score 80. Right dominant system. Mild luminal irregularities in the RCA, LCX and LAD. No significant CAD.   12/29/10 [facility]): Global LVF was mildly depress, EF calculated at 50%. No significant CAD (40% proximal LAD, minor luminal irregularities mid CX and mid RCA. Right dominant.) with borderline LVF.  Dr. Carlean Jews office had requested patient get labs at [facility]. 12/24/14 labs received and included parathyroid hormon e, calcium, ESR, CRP,prealbumin, vitamin D, CMET, A1C (5.5%), and CBC with diff WNL, but ionized calcium was slightly elevated at 1.33 (1.12-1.32).   Dr. Humphrey Rolls is aware of surgery plans. Had "equivocal" stress test earlier this year but mild CAD by 2012 cath and 2014 coronary CTA. Dr. Humphrey Rolls recommended continued medical therapy. He  is a same day work-up, so further anesthesiologist evaluation on the day of surgery to ensure no new or progressive  symptoms before proceeding.    Bruce [name] [facility]/Anesthesiology Phone [phone] 12/28/2014 12:05 PM

## 2014-12-29 ENCOUNTER — Inpatient Hospital Stay (HOSPITAL_COMMUNITY): Payer: Medicare HMO

## 2014-12-29 ENCOUNTER — Inpatient Hospital Stay (HOSPITAL_COMMUNITY): Payer: Medicare HMO | Admitting: Vascular Surgery

## 2014-12-29 ENCOUNTER — Encounter (HOSPITAL_COMMUNITY)
Admission: RE | Disposition: A | Discharge status: Home Health | Payer: Self-pay | Source: Ambulatory Visit | Attending: Orthopedic Surgery

## 2014-12-29 ENCOUNTER — Inpatient Hospital Stay (HOSPITAL_COMMUNITY)
Admission: RE | Admit: 2014-12-29 | Discharge: 2015-01-01 | DRG: 493 | Disposition: A | Discharge status: Home Health | Payer: Medicare HMO | Source: Ambulatory Visit | Attending: Orthopedic Surgery | Admitting: Orthopedic Surgery

## 2014-12-29 DIAGNOSIS — I82409 Acute embolism and thrombosis of unspecified deep veins of unspecified lower extremity: Secondary | ICD-10-CM | POA: Diagnosis present

## 2014-12-29 DIAGNOSIS — I2510 Atherosclerotic heart disease of native coronary artery without angina pectoris: Secondary | ICD-10-CM | POA: Diagnosis present

## 2014-12-29 DIAGNOSIS — I252 Old myocardial infarction: Secondary | ICD-10-CM

## 2014-12-29 DIAGNOSIS — G8929 Other chronic pain: Secondary | ICD-10-CM | POA: Diagnosis present

## 2014-12-29 DIAGNOSIS — Z7982 Long term (current) use of aspirin: Secondary | ICD-10-CM

## 2014-12-29 DIAGNOSIS — D62 Acute posthemorrhagic anemia: Secondary | ICD-10-CM | POA: Diagnosis not present

## 2014-12-29 DIAGNOSIS — S82121K Displaced fracture of lateral condyle of right tibia, subsequent encounter for closed fracture with nonunion: Secondary | ICD-10-CM | POA: Diagnosis present

## 2014-12-29 DIAGNOSIS — E785 Hyperlipidemia, unspecified: Secondary | ICD-10-CM | POA: Diagnosis present

## 2014-12-29 DIAGNOSIS — E1151 Type 2 diabetes mellitus with diabetic peripheral angiopathy without gangrene: Secondary | ICD-10-CM | POA: Diagnosis present

## 2014-12-29 DIAGNOSIS — K219 Gastro-esophageal reflux disease without esophagitis: Secondary | ICD-10-CM | POA: Diagnosis present

## 2014-12-29 DIAGNOSIS — J441 Chronic obstructive pulmonary disease with (acute) exacerbation: Secondary | ICD-10-CM | POA: Diagnosis present

## 2014-12-29 DIAGNOSIS — F17200 Nicotine dependence, unspecified, uncomplicated: Secondary | ICD-10-CM | POA: Diagnosis present

## 2014-12-29 DIAGNOSIS — Z7901 Long term (current) use of anticoagulants: Secondary | ICD-10-CM

## 2014-12-29 DIAGNOSIS — S82143A Displaced bicondylar fracture of unspecified tibia, initial encounter for closed fracture: Secondary | ICD-10-CM

## 2014-12-29 DIAGNOSIS — I10 Essential (primary) hypertension: Secondary | ICD-10-CM | POA: Diagnosis present

## 2014-12-29 DIAGNOSIS — F419 Anxiety disorder, unspecified: Secondary | ICD-10-CM | POA: Diagnosis present

## 2014-12-29 DIAGNOSIS — R52 Pain, unspecified: Secondary | ICD-10-CM

## 2014-12-29 DIAGNOSIS — J449 Chronic obstructive pulmonary disease, unspecified: Secondary | ICD-10-CM | POA: Diagnosis present

## 2014-12-29 DIAGNOSIS — S82141K Displaced bicondylar fracture of right tibia, subsequent encounter for closed fracture with nonunion: Principal | ICD-10-CM

## 2014-12-29 DIAGNOSIS — W208XXD Other cause of strike by thrown, projected or falling object, subsequent encounter: Secondary | ICD-10-CM | POA: Diagnosis present

## 2014-12-29 DIAGNOSIS — E119 Type 2 diabetes mellitus without complications: Secondary | ICD-10-CM

## 2014-12-29 DIAGNOSIS — S82141A Displaced bicondylar fracture of right tibia, initial encounter for closed fracture: Secondary | ICD-10-CM | POA: Diagnosis present

## 2014-12-29 DIAGNOSIS — Z419 Encounter for procedure for purposes other than remedying health state, unspecified: Secondary | ICD-10-CM

## 2014-12-29 HISTORY — DX: Unspecified osteoarthritis, unspecified site: M19.90

## 2014-12-29 HISTORY — DX: Personal history of other medical treatment: Z92.89

## 2014-12-29 HISTORY — PX: ORIF TIBIA FRACTURE: SHX5416

## 2014-12-29 HISTORY — DX: Displaced bicondylar fracture of unspecified tibia, initial encounter for closed fracture: S82.143A

## 2014-12-29 HISTORY — DX: Urinary tract infection, site not specified: N39.0

## 2014-12-29 HISTORY — DX: Displaced fracture of lateral condyle of right tibia, subsequent encounter for closed fracture with nonunion: S82.121K

## 2014-12-29 LAB — PROTIME-INR
INR: 1.13 (ref 0.00–1.49)
Prothrombin Time: 14.7 seconds (ref 11.6–15.2)

## 2014-12-29 LAB — GLUCOSE, CAPILLARY: GLUCOSE-CAPILLARY: 112 mg/dL — AB (ref 65–99)

## 2014-12-29 LAB — APTT: APTT: 29 s (ref 24–37)

## 2014-12-29 SURGERY — OPEN REDUCTION INTERNAL FIXATION (ORIF) TIBIA FRACTURE
Anesthesia: Regional | Site: Leg Lower | Laterality: Right

## 2014-12-29 MED ORDER — OXYCODONE HCL 5 MG PO TABS
5.0000 mg | ORAL_TABLET | Freq: Once | ORAL | Status: AC | PRN
  Administered 2014-12-29: 5 mg via ORAL

## 2014-12-29 MED ORDER — OXYCODONE HCL 5 MG PO TABS
ORAL_TABLET | ORAL | Status: AC
  Filled 2014-12-29: qty 1

## 2014-12-29 MED ORDER — FENTANYL CITRATE (PF) 100 MCG/2ML IJ SOLN
INTRAMUSCULAR | Status: AC
  Filled 2014-12-29: qty 2

## 2014-12-29 MED ORDER — DOCUSATE SODIUM 100 MG PO CAPS
100.0000 mg | ORAL_CAPSULE | Freq: Two times a day (BID) | ORAL | Status: DC
Start: 2014-12-30 — End: 2015-01-01
  Administered 2014-12-30 – 2015-01-01 (×6): 100 mg via ORAL
  Filled 2014-12-29 (×6): qty 1

## 2014-12-29 MED ORDER — NITROGLYCERIN 0.4 MG SL SUBL: 0.4000 mg | SUBLINGUAL_TABLET | SUBLINGUAL | Status: DC | PRN

## 2014-12-29 MED ORDER — HYDROMORPHONE HCL 1 MG/ML IJ SOLN
0.5000 mg | INTRAMUSCULAR | Status: DC | PRN
  Administered 2014-12-30: 0.5 mg via INTRAVENOUS
  Filled 2014-12-29: qty 1

## 2014-12-29 MED ORDER — PROMETHAZINE HCL 25 MG/ML IJ SOLN
INTRAMUSCULAR | Status: AC
  Filled 2014-12-29: qty 1

## 2014-12-29 MED ORDER — HEPARIN SODIUM (PORCINE) 1000 UNIT/ML IJ SOLN
INTRAMUSCULAR | Status: AC
  Filled 2014-12-29: qty 1

## 2014-12-29 MED ORDER — METOCLOPRAMIDE HCL 5 MG PO TABS: 5.0000 mg | ORAL_TABLET | Freq: Three times a day (TID) | ORAL | Status: DC | PRN

## 2014-12-29 MED ORDER — ROCURONIUM BROMIDE 50 MG/5ML IV SOLN
INTRAVENOUS | Status: AC
  Filled 2014-12-29: qty 3

## 2014-12-29 MED ORDER — ONDANSETRON HCL 4 MG/2ML IJ SOLN: 4.0000 mg | Freq: Four times a day (QID) | INTRAMUSCULAR | Status: DC | PRN

## 2014-12-29 MED ORDER — ACETAMINOPHEN 650 MG RE SUPP: 650.0000 mg | Freq: Four times a day (QID) | RECTAL | Status: DC | PRN

## 2014-12-29 MED ORDER — SUGAMMADEX SODIUM 200 MG/2ML IV SOLN
INTRAVENOUS | Status: DC | PRN
  Administered 2014-12-29: 200 mg via INTRAVENOUS

## 2014-12-29 MED ORDER — SODIUM CHLORIDE 0.9 % IJ SOLN: 9.0000 mL | INTRAMUSCULAR | Status: DC | PRN

## 2014-12-29 MED ORDER — ONDANSETRON HCL 4 MG/2ML IJ SOLN
INTRAMUSCULAR | Status: AC
  Filled 2014-12-29: qty 2

## 2014-12-29 MED ORDER — ROCURONIUM BROMIDE 100 MG/10ML IV SOLN
INTRAVENOUS | Status: DC | PRN
  Administered 2014-12-29: 10 mg via INTRAVENOUS
  Administered 2014-12-29: 50 mg via INTRAVENOUS
  Administered 2014-12-29: 10 mg via INTRAVENOUS
  Administered 2014-12-29: 20 mg via INTRAVENOUS
  Administered 2014-12-29: 10 mg via INTRAVENOUS
  Administered 2014-12-29: 20 mg via INTRAVENOUS

## 2014-12-29 MED ORDER — CEFAZOLIN SODIUM-DEXTROSE 2-3 GM-% IV SOLR
2.0000 g | Freq: Three times a day (TID) | INTRAVENOUS | Status: AC
  Administered 2014-12-30 (×3): 2 g via INTRAVENOUS
  Filled 2014-12-29 (×3): qty 50

## 2014-12-29 MED ORDER — BISACODYL 5 MG PO TBEC: 5.0000 mg | DELAYED_RELEASE_TABLET | Freq: Every day | ORAL | Status: DC | PRN

## 2014-12-29 MED ORDER — FUROSEMIDE 20 MG PO TABS
20.0000 mg | ORAL_TABLET | Freq: Every day | ORAL | Status: DC
  Administered 2014-12-31 – 2015-01-01 (×2): 20 mg via ORAL
  Filled 2014-12-29 (×3): qty 1

## 2014-12-29 MED ORDER — FENTANYL CITRATE (PF) 100 MCG/2ML IJ SOLN
INTRAMUSCULAR | Status: DC | PRN
  Administered 2014-12-29 (×2): 50 ug via INTRAVENOUS

## 2014-12-29 MED ORDER — FENTANYL CITRATE (PF) 250 MCG/5ML IJ SOLN
INTRAMUSCULAR | Status: AC
  Filled 2014-12-29: qty 5

## 2014-12-29 MED ORDER — OXYCODONE-ACETAMINOPHEN 5-325 MG PO TABS: 1.0000 | ORAL_TABLET | Freq: Four times a day (QID) | ORAL | Status: DC | PRN

## 2014-12-29 MED ORDER — MIDAZOLAM HCL 2 MG/2ML IJ SOLN
2.0000 mg | Freq: Once | INTRAMUSCULAR | Status: AC
  Administered 2014-12-29: 2 mg via INTRAVENOUS

## 2014-12-29 MED ORDER — HYDROMORPHONE 1 MG/ML IV SOLN
INTRAVENOUS | Status: DC
  Administered 2014-12-30: 2.1 mL via INTRAVENOUS
  Administered 2014-12-30: 3.1 mg via INTRAVENOUS
  Administered 2014-12-30: 1.5 mg via INTRAVENOUS

## 2014-12-29 MED ORDER — MIDAZOLAM HCL 2 MG/2ML IJ SOLN
INTRAMUSCULAR | Status: AC
  Filled 2014-12-29: qty 4

## 2014-12-29 MED ORDER — METOCLOPRAMIDE HCL 5 MG/ML IJ SOLN: 5.0000 mg | Freq: Three times a day (TID) | INTRAMUSCULAR | Status: DC | PRN

## 2014-12-29 MED ORDER — HYDROMORPHONE HCL 1 MG/ML IJ SOLN
0.2500 mg | INTRAMUSCULAR | Status: DC | PRN
  Administered 2014-12-29 (×2): 0.5 mg via INTRAVENOUS

## 2014-12-29 MED ORDER — POTASSIUM CHLORIDE IN NACL 20-0.9 MEQ/L-% IV SOLN
INTRAVENOUS | Status: DC
  Administered 2014-12-30: via INTRAVENOUS
  Filled 2014-12-29 (×2): qty 1000

## 2014-12-29 MED ORDER — DIPHENHYDRAMINE HCL 12.5 MG/5ML PO ELIX: 12.5000 mg | ORAL_SOLUTION | ORAL | Status: DC | PRN

## 2014-12-29 MED ORDER — PANTOPRAZOLE SODIUM 40 MG PO TBEC
40.0000 mg | DELAYED_RELEASE_TABLET | Freq: Every day | ORAL | Status: DC
  Administered 2014-12-30 – 2015-01-01 (×3): 40 mg via ORAL
  Filled 2014-12-29 (×3): qty 1

## 2014-12-29 MED ORDER — PROPOFOL 10 MG/ML IV BOLUS
INTRAVENOUS | Status: AC
  Filled 2014-12-29: qty 20

## 2014-12-29 MED ORDER — DIPHENHYDRAMINE HCL 50 MG/ML IJ SOLN: 12.5000 mg | Freq: Four times a day (QID) | INTRAMUSCULAR | Status: DC | PRN

## 2014-12-29 MED ORDER — OXYCODONE HCL 5 MG PO TABS: 5.0000 mg | ORAL_TABLET | Freq: Once | ORAL | Status: DC | PRN

## 2014-12-29 MED ORDER — OXYCODONE HCL 5 MG PO TABS
5.0000 mg | ORAL_TABLET | ORAL | Status: DC | PRN
  Administered 2014-12-30 – 2015-01-01 (×5): 10 mg via ORAL
  Filled 2014-12-29 (×5): qty 2

## 2014-12-29 MED ORDER — HYDROMORPHONE HCL 1 MG/ML IJ SOLN
INTRAMUSCULAR | Status: AC
  Filled 2014-12-29: qty 1

## 2014-12-29 MED ORDER — MAGNESIUM CITRATE PO SOLN: 1.0000 | Freq: Once | ORAL | Status: DC | PRN

## 2014-12-29 MED ORDER — ISOSORBIDE MONONITRATE ER 60 MG PO TB24
60.0000 mg | ORAL_TABLET | Freq: Every day | ORAL | Status: DC
  Administered 2014-12-30 – 2014-12-31 (×3): 60 mg via ORAL
  Filled 2014-12-29 (×3): qty 1

## 2014-12-29 MED ORDER — DIPHENHYDRAMINE HCL 12.5 MG/5ML PO ELIX: 12.5000 mg | ORAL_SOLUTION | Freq: Four times a day (QID) | ORAL | Status: DC | PRN

## 2014-12-29 MED ORDER — HYDROMORPHONE HCL 1 MG/ML IJ SOLN: 0.2500 mg | INTRAMUSCULAR | Status: DC | PRN

## 2014-12-29 MED ORDER — FENTANYL CITRATE (PF) 100 MCG/2ML IJ SOLN
100.0000 ug | Freq: Once | INTRAMUSCULAR | Status: AC
  Administered 2014-12-29: 100 ug via INTRAVENOUS

## 2014-12-29 MED ORDER — MIDAZOLAM HCL 2 MG/2ML IJ SOLN
INTRAMUSCULAR | Status: AC
  Filled 2014-12-29: qty 2

## 2014-12-29 MED ORDER — PROTAMINE SULFATE 10 MG/ML IV SOLN
INTRAVENOUS | Status: AC
  Filled 2014-12-29: qty 25

## 2014-12-29 MED ORDER — EPHEDRINE SULFATE 50 MG/ML IJ SOLN
INTRAMUSCULAR | Status: DC | PRN
  Administered 2014-12-29 (×4): 10 mg via INTRAVENOUS

## 2014-12-29 MED ORDER — PHENYLEPHRINE HCL 10 MG/ML IJ SOLN
INTRAMUSCULAR | Status: AC
  Filled 2014-12-29: qty 1

## 2014-12-29 MED ORDER — VITAMIN D3 25 MCG (1000 UNIT) PO TABS
1000.0000 [IU] | ORAL_TABLET | Freq: Every day | ORAL | Status: DC
  Administered 2014-12-30 – 2015-01-01 (×3): 1000 [IU] via ORAL
  Filled 2014-12-29 (×6): qty 1

## 2014-12-29 MED ORDER — LIDOCAINE HCL (CARDIAC) 20 MG/ML IV SOLN
INTRAVENOUS | Status: AC
  Filled 2014-12-29: qty 10

## 2014-12-29 MED ORDER — BUPIVACAINE-EPINEPHRINE (PF) 0.5% -1:200000 IJ SOLN
INTRAMUSCULAR | Status: DC | PRN
  Administered 2014-12-29: 30 mL via PERINEURAL

## 2014-12-29 MED ORDER — ALBUMIN HUMAN 5 % IV SOLN
INTRAVENOUS | Status: DC | PRN
  Administered 2014-12-29 (×2): via INTRAVENOUS

## 2014-12-29 MED ORDER — OXYCODONE HCL 5 MG/5ML PO SOLN: 5.0000 mg | Freq: Once | ORAL | Status: DC | PRN

## 2014-12-29 MED ORDER — METHOCARBAMOL 1000 MG/10ML IJ SOLN: 500.0000 mg | Freq: Four times a day (QID) | INTRAVENOUS | Status: DC | PRN

## 2014-12-29 MED ORDER — SUGAMMADEX SODIUM 200 MG/2ML IV SOLN
INTRAVENOUS | Status: AC
  Filled 2014-12-29: qty 2

## 2014-12-29 MED ORDER — ATORVASTATIN CALCIUM 80 MG PO TABS
80.0000 mg | ORAL_TABLET | Freq: Every day | ORAL | Status: DC
  Administered 2014-12-30 – 2014-12-31 (×3): 80 mg via ORAL
  Filled 2014-12-29 (×3): qty 1

## 2014-12-29 MED ORDER — ONDANSETRON HCL 4 MG/2ML IJ SOLN
INTRAMUSCULAR | Status: DC | PRN
  Administered 2014-12-29: 4 mg via INTRAVENOUS

## 2014-12-29 MED ORDER — 0.9 % SODIUM CHLORIDE (POUR BTL) OPTIME
TOPICAL | Status: DC | PRN
  Administered 2014-12-29: 1000 mL

## 2014-12-29 MED ORDER — METOPROLOL SUCCINATE ER 50 MG PO TB24
50.0000 mg | ORAL_TABLET | Freq: Every day | ORAL | Status: DC
  Administered 2014-12-31 – 2015-01-01 (×2): 50 mg via ORAL
  Filled 2014-12-29 (×3): qty 1

## 2014-12-29 MED ORDER — MEPERIDINE HCL 25 MG/ML IJ SOLN: 6.2500 mg | INTRAMUSCULAR | Status: DC | PRN

## 2014-12-29 MED ORDER — ONDANSETRON HCL 4 MG PO TABS: 4.0000 mg | ORAL_TABLET | Freq: Four times a day (QID) | ORAL | Status: DC | PRN

## 2014-12-29 MED ORDER — ONDANSETRON HCL 4 MG/2ML IJ SOLN
4.0000 mg | Freq: Four times a day (QID) | INTRAMUSCULAR | Status: AC | PRN
  Administered 2014-12-29: 4 mg via INTRAVENOUS

## 2014-12-29 MED ORDER — NALOXONE HCL 0.4 MG/ML IJ SOLN: 0.4000 mg | INTRAMUSCULAR | Status: DC | PRN

## 2014-12-29 MED ORDER — FLUTICASONE PROPIONATE 50 MCG/ACT NA SUSP
1.0000 | Freq: Every day | NASAL | Status: DC
  Administered 2014-12-31 – 2015-01-01 (×2): 1 via NASAL
  Filled 2014-12-29: qty 16

## 2014-12-29 MED ORDER — RIVAROXABAN 15 MG PO TABS
15.0000 mg | ORAL_TABLET | Freq: Two times a day (BID) | ORAL | Status: DC
  Administered 2014-12-30: 15 mg via ORAL
  Filled 2014-12-29 (×4): qty 1

## 2014-12-29 MED ORDER — POLYETHYLENE GLYCOL 3350 17 G PO PACK
17.0000 g | PACK | Freq: Every day | ORAL | Status: DC
  Administered 2014-12-30 – 2015-01-01 (×3): 17 g via ORAL
  Filled 2014-12-29 (×3): qty 1

## 2014-12-29 MED ORDER — CHLORHEXIDINE GLUCONATE 4 % EX LIQD: 60.0000 mL | Freq: Once | CUTANEOUS | Status: DC

## 2014-12-29 MED ORDER — METHOCARBAMOL 500 MG PO TABS
500.0000 mg | ORAL_TABLET | Freq: Four times a day (QID) | ORAL | Status: DC | PRN
  Administered 2014-12-30 – 2015-01-01 (×7): 1000 mg via ORAL
  Filled 2014-12-29 (×8): qty 2

## 2014-12-29 MED ORDER — OXYCODONE HCL 5 MG PO TABS: 5.0000 mg | ORAL_TABLET | Freq: Four times a day (QID) | ORAL | Status: DC | PRN

## 2014-12-29 MED ORDER — ACETAMINOPHEN 325 MG PO TABS
650.0000 mg | ORAL_TABLET | Freq: Four times a day (QID) | ORAL | Status: DC | PRN
  Administered 2015-01-01: 650 mg via ORAL
  Filled 2014-12-29: qty 2

## 2014-12-29 MED ORDER — LACTATED RINGERS IV SOLN
INTRAVENOUS | Status: DC
  Administered 2014-12-29 (×3): via INTRAVENOUS

## 2014-12-29 MED ORDER — LIDOCAINE HCL (CARDIAC) 20 MG/ML IV SOLN
INTRAVENOUS | Status: DC | PRN
  Administered 2014-12-29: 60 mg via INTRAVENOUS

## 2014-12-29 MED ORDER — OXYCODONE HCL 5 MG/5ML PO SOLN: 5.0000 mg | Freq: Once | ORAL | Status: AC | PRN

## 2014-12-29 MED ORDER — HYDROMORPHONE 1 MG/ML IV SOLN
INTRAVENOUS | Status: AC
  Administered 2014-12-29: 20:00:00
  Filled 2014-12-29: qty 25

## 2014-12-29 MED ORDER — PROPOFOL 10 MG/ML IV BOLUS
INTRAVENOUS | Status: DC | PRN
  Administered 2014-12-29: 150 mg via INTRAVENOUS

## 2014-12-29 MED ORDER — MONTELUKAST SODIUM 10 MG PO TABS
10.0000 mg | ORAL_TABLET | Freq: Every day | ORAL | Status: DC
  Administered 2014-12-30 – 2014-12-31 (×3): 10 mg via ORAL
  Filled 2014-12-29 (×3): qty 1

## 2014-12-29 MED ORDER — PHENYLEPHRINE HCL 10 MG/ML IJ SOLN
INTRAMUSCULAR | Status: DC | PRN
  Administered 2014-12-29 (×2): 40 ug via INTRAVENOUS
  Administered 2014-12-29: 80 ug via INTRAVENOUS

## 2014-12-29 MED ORDER — PROMETHAZINE HCL 25 MG/ML IJ SOLN
6.2500 mg | INTRAMUSCULAR | Status: DC | PRN
  Administered 2014-12-29: 12.5 mg via INTRAVENOUS

## 2014-12-29 MED ORDER — CEFAZOLIN SODIUM-DEXTROSE 2-3 GM-% IV SOLR
INTRAVENOUS | Status: DC | PRN
  Administered 2014-12-29 (×2): 2 g via INTRAVENOUS

## 2014-12-29 SURGICAL SUPPLY — 112 items
ASMB TUBE 520 STRL RMR IRR (MISCELLANEOUS) ×1
BANDAGE ELASTIC 4 VELCRO ST LF (GAUZE/BANDAGES/DRESSINGS) ×3 IMPLANT
BANDAGE ELASTIC 6 VELCRO ST LF (GAUZE/BANDAGES/DRESSINGS) ×3 IMPLANT
BANDAGE ESMARK 6X9 LF (GAUZE/BANDAGES/DRESSINGS) ×1 IMPLANT
BIT DRILL 100X2.5XANTM LCK (BIT) IMPLANT
BIT DRILL 3.5X5.5 QC CALB (BIT) ×3 IMPLANT
BIT DRL 100X2.5XANTM LCK (BIT) ×1
BLADE SURG 10 STRL SS (BLADE) ×3 IMPLANT
BLADE SURG 15 STRL LF DISP TIS (BLADE) ×1 IMPLANT
BLADE SURG 15 STRL SS (BLADE) ×3
BLADE SURG ROTATE 9660 (MISCELLANEOUS) IMPLANT
BNDG CMPR 9X6 STRL LF SNTH (GAUZE/BANDAGES/DRESSINGS) ×1
BNDG COHESIVE 4X5 TAN STRL (GAUZE/BANDAGES/DRESSINGS) ×3 IMPLANT
BNDG ESMARK 6X9 LF (GAUZE/BANDAGES/DRESSINGS) ×3
BNDG GAUZE ELAST 4 BULKY (GAUZE/BANDAGES/DRESSINGS) ×3 IMPLANT
BONE CANC CHIPS 20CC PCAN1/4 (Bone Implant) ×6 IMPLANT
BRUSH SCRUB DISP (MISCELLANEOUS) ×6 IMPLANT
BUR 7 SOFT (BURR) ×1 IMPLANT
BUR 7MM SOFT (BURR) ×1
CHIPS CANC BONE 20CC PCAN1/4 (Bone Implant) ×2 IMPLANT
CLIP LOCKING FOR RIA (CLIP) ×2 IMPLANT
COVER MAYO STAND STRL (DRAPES) ×3 IMPLANT
DRAPE C-ARM 42X72 X-RAY (DRAPES) ×3 IMPLANT
DRAPE C-ARMOR (DRAPES) ×3 IMPLANT
DRAPE INCISE IOBAN 66X45 STRL (DRAPES) ×3 IMPLANT
DRAPE ORTHO SPLIT 77X108 STRL (DRAPES)
DRAPE SURG ORHT 6 SPLT 77X108 (DRAPES) IMPLANT
DRAPE U-SHAPE 47X51 STRL (DRAPES) ×3 IMPLANT
DRILL BIT 2.5MM (BIT) ×3
DRILL BIT 2.7X100 214235006 DU (MISCELLANEOUS) ×2 IMPLANT
DRSG ADAPTIC 3X8 NADH LF (GAUZE/BANDAGES/DRESSINGS) ×3 IMPLANT
DRSG PAD ABDOMINAL 8X10 ST (GAUZE/BANDAGES/DRESSINGS) ×8 IMPLANT
ELECT REM PT RETURN 9FT ADLT (ELECTROSURGICAL) ×3
ELECTRODE REM PT RTRN 9FT ADLT (ELECTROSURGICAL) ×1 IMPLANT
EVACUATOR 1/8 PVC DRAIN (DRAIN) IMPLANT
EVACUATOR 3/16  PVC DRAIN (DRAIN)
EVACUATOR 3/16 PVC DRAIN (DRAIN) IMPLANT
GAUZE SPONGE 4X4 12PLY STRL (GAUZE/BANDAGES/DRESSINGS) ×3 IMPLANT
GLOVE BIO SURGEON STRL SZ7.5 (GLOVE) ×3 IMPLANT
GLOVE BIO SURGEON STRL SZ8 (GLOVE) ×3 IMPLANT
GLOVE BIOGEL PI IND STRL 7.5 (GLOVE) ×1 IMPLANT
GLOVE BIOGEL PI IND STRL 8 (GLOVE) ×1 IMPLANT
GLOVE BIOGEL PI INDICATOR 7.5 (GLOVE) ×2
GLOVE BIOGEL PI INDICATOR 8 (GLOVE) ×2
GLOVE PROGUARD SZ 7 1/2 (GLOVE) ×3 IMPLANT
GLOVE XGUARD RR 2 7.5 (GLOVE) IMPLANT
GLOVE XGUARD RR2 7.5 (GLOVE) ×3
GOWN STRL REUS W/ TWL LRG LVL3 (GOWN DISPOSABLE) ×2 IMPLANT
GOWN STRL REUS W/ TWL XL LVL3 (GOWN DISPOSABLE) ×1 IMPLANT
GOWN STRL REUS W/TWL LRG LVL3 (GOWN DISPOSABLE) ×6
GOWN STRL REUS W/TWL XL LVL3 (GOWN DISPOSABLE) ×3
GRAFT BNE CANC CHIPS 1-8 20CC (Bone Implant) IMPLANT
GRAFT FILTER FOR RIA 520 LGTH (MISCELLANEOUS) ×3 IMPLANT
GUIDEWIRE 3.2X400 (WIRE) ×2 IMPLANT
IMMOBILIZER KNEE 20 (SOFTGOODS) ×3
IMMOBILIZER KNEE 20 THIGH 36 (SOFTGOODS) IMPLANT
IMMOBILIZER KNEE 22 UNIV (SOFTGOODS) ×3 IMPLANT
K-WIRE ACE 1.6X6 (WIRE) ×15
KIT BASIN OR (CUSTOM PROCEDURE TRAY) ×3 IMPLANT
KIT INFUSE LRG II (Orthopedic Implant) ×2 IMPLANT
KIT ROOM TURNOVER OR (KITS) ×3 IMPLANT
KWIRE ACE 1.6X6 (WIRE) IMPLANT
MANIFOLD NEPTUNE II (INSTRUMENTS) ×3 IMPLANT
NDL SUT .5 MAYO 1.404X.05X (NEEDLE) IMPLANT
NDL SUT 6 .5 CRC .975X.05 MAYO (NEEDLE) ×1 IMPLANT
NEEDLE 22X1 1/2 (OR ONLY) (NEEDLE) IMPLANT
NEEDLE MAYO TAPER (NEEDLE) ×3
NS IRRIG 1000ML POUR BTL (IV SOLUTION) ×3 IMPLANT
PACK ORTHO EXTREMITY (CUSTOM PROCEDURE TRAY) ×3 IMPLANT
PAD ARMBOARD 7.5X6 YLW CONV (MISCELLANEOUS) ×6 IMPLANT
PAD CAST 4YDX4 CTTN HI CHSV (CAST SUPPLIES) ×1 IMPLANT
PADDING CAST COTTON 4X4 STRL (CAST SUPPLIES) ×3
PADDING CAST COTTON 6X4 STRL (CAST SUPPLIES) ×3 IMPLANT
PLATE ACE FIBULAR (Plate) ×2 IMPLANT
PLATE LOCK 5H STD RT PROX TIB (Plate) ×2 IMPLANT
REAMER HEAD 12.5MM (MISCELLANEOUS) ×2 IMPLANT
REAMER ROD DEEP FLUTE 2.5X950 (INSTRUMENTS) ×2 IMPLANT
RIA DRIVE SHAFT SEAL- STERILE ×2 IMPLANT
SCREW CORT 3.5X30 815037030 (Screw) ×2 IMPLANT
SCREW CORT 3.5X32 815037032 (Screw) ×2 IMPLANT
SCREW CORT 3.5X44 815037044 (Screw) ×4 IMPLANT
SCREW CORT 3.5X46 815037046 (Screw) ×2 IMPLANT
SCREW CORTICAL 3.5MM  34MM (Screw) ×2 IMPLANT
SCREW CORTICAL 3.5MM  42MM (Screw) ×2 IMPLANT
SCREW CORTICAL 3.5MM 34MM (Screw) ×1 IMPLANT
SCREW CORTICAL 3.5MM 42MM (Screw) ×1 IMPLANT
SCREW CORTICAL 3.5MM 50MM (Screw) ×2 IMPLANT
SCREW LOCK CORT STAR 3.5X75 (Screw) ×6 IMPLANT
SCREW LOCK CORT STAR 3.5X80 (Screw) ×6 IMPLANT
SCREW LOW PROF CORTICAL 3.5X80 (Screw) ×2 IMPLANT
SET CYSTO W/LG BORE CLAMP LF (SET/KITS/TRAYS/PACK) ×2 IMPLANT
SPONGE LAP 18X18 X RAY DECT (DISPOSABLE) ×3 IMPLANT
STAPLER VISISTAT 35W (STAPLE) ×3 IMPLANT
STOCKINETTE IMPERVIOUS LG (DRAPES) ×3 IMPLANT
SUCTION FRAZIER TIP 10 FR DISP (SUCTIONS) ×3 IMPLANT
SUT ETHILON 3 0 PS 1 (SUTURE) IMPLANT
SUT PROLENE 0 CT 2 (SUTURE) ×6 IMPLANT
SUT VIC AB 0 CT1 27 (SUTURE) ×3
SUT VIC AB 0 CT1 27XBRD ANBCTR (SUTURE) ×1 IMPLANT
SUT VIC AB 1 CT1 27 (SUTURE) ×3
SUT VIC AB 1 CT1 27XBRD ANBCTR (SUTURE) ×1 IMPLANT
SUT VIC AB 2-0 CT1 27 (SUTURE) ×6
SUT VIC AB 2-0 CT1 TAPERPNT 27 (SUTURE) ×2 IMPLANT
SYR 20ML ECCENTRIC (SYRINGE) IMPLANT
TOWEL OR 17X24 6PK STRL BLUE (TOWEL DISPOSABLE) ×3 IMPLANT
TOWEL OR 17X26 10 PK STRL BLUE (TOWEL DISPOSABLE) ×6 IMPLANT
TRAY FOLEY CATH 16FRSI W/METER (SET/KITS/TRAYS/PACK) IMPLANT
TUBE ASSEMBLY RIA STERILE (MISCELLANEOUS) ×2 IMPLANT
TUBE CONNECTING 12'X1/4 (SUCTIONS) ×1
TUBE CONNECTING 12X1/4 (SUCTIONS) ×2 IMPLANT
WATER STERILE IRR 1000ML POUR (IV SOLUTION) ×6 IMPLANT
YANKAUER SUCT BULB TIP NO VENT (SUCTIONS) ×3 IMPLANT

## 2014-12-29 NOTE — Brief Op Note (Signed)
12/29/2014  6:49 PM  PATIENT:  Bruce [name]  69 y.o. male  PRE-OPERATIVE DIAGNOSIS:  right proximal tibia nonunion  POST-OPERATIVE DIAGNOSIS:  right proximal tibia nonunion  PROCEDURE:  Procedure(s): 1. OPEN REDUCTION INTERNAL FIXATION (ORIF) RIGHT TIBIA PLATEAU FRACTURE  2. REPAIR OF TIBIAL NONUNION WITH AUTOGRAFTING, RIA (Right) 3. OSTEOTOMY RIGHT TIBIA  SURGEON:  Surgeon(s) and Role:    * Altamese Buckland, MD - Primary  PHYSICIAN ASSISTANT: 1. Ainsley Spinner, PA-C; 2. PA student  ANESTHESIA:   general  I/O:  Total I/O In: 2500 [I.V.:2000; IV Piggyback:500] Out: 800 [Urine:500; Blood:300]  SPECIMEN:  No Specimen  TOURNIQUET:  * No tourniquets in log *  DICTATION: .Other Dictation: Dictation Number [id number]

## 2014-12-29 NOTE — Anesthesia Procedure Notes (Addendum)
Anesthesia Regional Block:  Femoral nerve block  Pre-Anesthetic Checklist: ,, timeout performed, Correct Patient, Correct Site, Correct Laterality, Correct Procedure,, site marked, risks and benefits discussed, Surgical consent,  Pre-op evaluation,  At surgeon's request and post-op pain management  Laterality: Right  Prep: chloraprep       Needles:  Injection technique: Single-shot  Needle Type: Echogenic Stimulator Needle     Needle Length: 9cm 9 cm Needle Gauge: 21 and 21 G    Additional Needles:  Procedures: nerve stimulator Femoral nerve block  Nerve Stimulator or Paresthesia:  Response: Quadriceps muscle contraction, 0.45 mA,   Additional Responses:   Narrative:  Start time: 12/29/2014 1:03 PM End time: 12/29/2014 1:14 PM Injection made incrementally with aspirations every 5 mL.  Performed by: Personally  Anesthesiologist: HODIERNE, ADAM  Additional Notes: Functioning IV was confirmed and monitors were applied.  A 32mm 21ga Arrow echogenic stimulator needle was used. Sterile prep and drape,hand hygiene and sterile gloves were used.  Negative aspiration and negative test dose prior to incremental administration of local anesthetic. The patient tolerated the procedure well.     Procedure Name: Intubation Date/Time: 12/29/2014 1:28 PM Performed by: Shirlyn Goltz Pre-anesthesia Checklist: Patient identified, Emergency Drugs available, Suction available and Patient being monitored Patient Re-evaluated:Patient Re-evaluated prior to inductionOxygen Delivery Method: Circle system utilized Preoxygenation: Pre-oxygenation with 100% oxygen Intubation Type: IV induction Ventilation: Mask ventilation without difficulty, Two handed mask ventilation required and Oral airway inserted - appropriate to patient size Laryngoscope Size: Mac and 4 Grade View: Grade I Tube type: Oral Tube size: 7.0 mm Number of attempts: 1 Airway Equipment and Method: Stylet Placement  Confirmation: ETT inserted through vocal cords under direct vision,  positive ETCO2 and breath sounds checked- equal and bilateral Secured at: 22 cm Tube secured with: Tape Dental Injury: Teeth and Oropharynx as per pre-operative assessment

## 2014-12-29 NOTE — Progress Notes (Signed)
Patient's upper dentures were not removed prior to OR.  Attempted to take dentures to son in lobby but son had not yet checked in and was not found in lobby.  Was not able to reach son by phone and mailbox was full.  Dentures taken to PACU and left at front desk.

## 2014-12-29 NOTE — Anesthesia Preprocedure Evaluation (Addendum)
Anesthesia Evaluation  Patient identified by MRN, date of birth, ID band Patient awake    Reviewed: Allergy & Precautions, NPO status , Patient's Chart, lab work & pertinent test results  Airway Mallampati: II   Neck ROM: full    Dental  (+) Poor Dentition, Missing, Chipped, Edentulous Upper   Pulmonary shortness of breath, COPD, Current Smoker,    breath sounds clear to auscultation       Cardiovascular hypertension, + angina + CAD, + Past MI and + Peripheral Vascular Disease   Rhythm:regular Rate:Normal     Neuro/Psych    GI/Hepatic GERD  ,  Endo/Other  diabetes, Type 2  Renal/GU      Musculoskeletal  (+) Arthritis ,   Abdominal   Peds  Hematology   Anesthesia Other Findings   Reproductive/Obstetrics                            Anesthesia Physical Anesthesia Plan  ASA: III  Anesthesia Plan: General and Regional   Post-op Pain Management: MAC Combined w/ Regional for Post-op pain   Induction: Intravenous  Airway Management Planned: Oral ETT  Additional Equipment:   Intra-op Plan:   Post-operative Plan: Extubation in OR  Informed Consent: I have reviewed the patients History and Physical, chart, labs and discussed the procedure including the risks, benefits and alternatives for the proposed anesthesia with the patient or authorized representative who has indicated his/her understanding and acceptance.     Plan Discussed with: CRNA, Anesthesiologist and Surgeon  Anesthesia Plan Comments:         Anesthesia Quick Evaluation

## 2014-12-29 NOTE — Transfer of Care (Signed)
Immediate Anesthesia Transfer of Care Note  Patient: Bruce [name]  Procedure(s) Performed: Procedure(s): OPEN REDUCTION INTERNAL FIXATION (ORIF) RIGHT TIBIA FRACTURE, RIA VS ICBG (Right)  Patient Location: PACU  Anesthesia Type:General and Regional  Level of Consciousness: awake, alert  and oriented  Airway & Oxygen Therapy: Patient Spontanous Breathing and Patient connected to nasal cannula oxygen  Post-op Assessment: Report given to RN and Post -op Vital signs reviewed and stable  Post vital signs: Reviewed and stable  Last Vitals:  Filed Vitals:   12/29/14 1255  BP: 129/76  Pulse: 63  Temp:   Resp: 18    Complications: No apparent anesthesia complications

## 2014-12-29 NOTE — Anesthesia Postprocedure Evaluation (Signed)
  Anesthesia Post-op Note  Patient: Bruce [name]  Procedure(s) Performed: Procedure(s): OPEN REDUCTION INTERNAL FIXATION (ORIF) RIGHT TIBIA FRACTURE, RIA VS ICBG (Right)  Patient Location: PACU  Anesthesia Type: General, Regional   Level of Consciousness: awake, alert  and oriented  Airway and Oxygen Therapy: Patient Spontanous Breathing  Post-op Pain: mild  Post-op Assessment: Post-op Vital signs reviewed  Post-op Vital Signs: Reviewed  Last Vitals:  Filed Vitals:   12/29/14 1940  BP: 112/64  Pulse: 76  Temp:   Resp: 16    Complications: No apparent anesthesia complications

## 2014-12-29 NOTE — Progress Notes (Signed)
Son, Bruce [name], aware that dentures were taken to PACU.

## 2014-12-29 NOTE — H&P (Signed)
Orthopaedic Trauma Service H&P  Chief Complaint:  R tibial plateau nonunion  HPI:   69 y/o white male well known to OTS for open grade IIIC R tibial plateau fracture 10/2013. Pt treated definitively with Ex fix due to soft tissue envelope issues. Pt continued to smoke during his recovery and has chronic pain issues as well. Pt has also recently established care with a PCP to help manage his chronic medical issues. 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 His nicotine/cotinine levels are pending   Pts xarelto has been on hold in preparation for surgery at the direction of his cardiologist   Past Medical History  Diagnosis Date  . Coronary artery disease   . Hypertension   . Myocardial infarction [facility])     '09 AND '12  . Shortness of breath     With exertion .  Marland Kitchen Peripheral vascular disease ([place])   . Diabetes mellitus without complication ([place])     does not take medication or test blood sugar  . UTI (urinary tract infection)     frequent UTI  . Arthritis   . History of blood transfusion   . Anginal pain ([place])     Left side if chest ,NTG  relieves chaes apin 12/01/13    Past Surgical History  Procedure Laterality Date  . Ivc filter  2009    placed @ [facility]/ Removed in 2010.  Marland Kitchen External fixation leg Right 10/06/2013    Procedure: CLOSED REDUCTION RIGHT TIBIAL PLATEAU FRACTURE, EXTERNAL FIXATION RIGHT LEG, PLACEMENT OF WOUND VAC;  Surgeon: Rozanna Box, MD;  Location: [place];  Service: Orthopedics;  Laterality: Right;  . Femoral-popliteal bypass graft Right 10/06/2013    Procedure: RIGHT POPLITEAL-POPLITEAL ARTERY BYPASS GRAFT;  Surgeon: Rosetta Posner, MD;  Location: [place];  Service: Vascular;  Laterality: Right;  . I&d extremity Right 10/09/2013    Procedure: IRRIGATION AND DEBRIDEMENT RIGHT LEG, CLOSURE  OF WOUNDS, PLACEMENT OF WOUND VAC ON EACH SIDE OF LEG;  Surgeon: Rozanna Box, MD;  Location: [place];  Service: Orthopedics;  Laterality: Right;  . Cardiac  catheterization    . Cholecystectomy    . Ligament leg Left   . Hardware removal Right 12/02/2013    Procedure: REMOVAL EXTERNAL FIXATION RIGHT LEG ;  Surgeon: Rozanna Box, MD;  Location: [place];  Service: Orthopedics;  Laterality: Right;  . Ivc filter removed      History reviewed. No pertinent family history. Social History:  reports that he has been smoking.  He does not have any smokeless tobacco history on file. He reports that he drinks about 8.4 oz of alcohol per week. He reports that he does not use illicit drugs.  Allergies: No Known Allergies  No current facility-administered medications on file prior to encounter.   Current Outpatient Prescriptions on File Prior to Encounter  Medication Sig Dispense Refill  . aspirin 81 MG EC tablet Take 81 mg by mouth 2 (two) times daily.     Marland Kitchen atorvastatin (LIPITOR) 80 MG tablet Take 80 mg by mouth at bedtime.     . benazepril-hydrochlorthiazide (LOTENSIN HCT) 20-12.5 MG per tablet Take 1 tablet by mouth daily.    Marland Kitchen CALCIUM CITRATE PO Take 250 mg by mouth at bedtime.     . Cholecalciferol (VITAMIN D3 PO) Take 1,000 tablets by mouth daily.     Marland Kitchen docusate sodium (COLACE) 100 MG capsule Take 100 mg by mouth 2 (two) times daily.    Marland Kitchen  furosemide (LASIX) 20 MG tablet Take 20 mg by mouth daily.    Marland Kitchen HYDROcodone-acetaminophen (NORCO/VICODIN) 5-325 MG per tablet Take 1-2 tablets by mouth every 6 (six) hours as needed for moderate pain.    . isosorbide mononitrate (IMDUR) 60 MG 24 hr tablet Take 60 mg by mouth at bedtime.     Marland Kitchen loratadine (CLARITIN) 10 MG tablet Take 10 mg by mouth at bedtime.     . metoprolol succinate (TOPROL-XL) 50 MG 24 hr tablet Take 1 tablet (50 mg total) by mouth daily. (Patient taking differently: Take 50 mg by mouth at bedtime. ) 90 tablet 0  . nitroGLYCERIN (NITROSTAT) 0.4 MG SL tablet Place 0.4 mg under the tongue every other day.    Marland Kitchen omeprazole (PRILOSEC) 20 MG capsule Take 1 capsule (20 mg total) by mouth daily. 90  capsule 0  . oxyCODONE-acetaminophen (PERCOCET) 5-325 MG per tablet Take 1-2 tablets by mouth every 6 (six) hours as needed (for pain).       No results found for this or any previous visit (from the past 48 hour(s)). No results found.  Review of Systems  Constitutional: Negative for fever and chills.  Respiratory: Negative for shortness of breath and wheezing.   Cardiovascular: Negative for chest pain and palpitations.  Gastrointestinal: Negative for nausea, vomiting and abdominal pain.  Musculoskeletal:       R knee pain   Neurological: Negative for tingling, sensory change and headaches.   Vitals on arrival to short stay  Physical Exam  Constitutional: He is cooperative. No distress.  Older appearing white male   Cardiovascular: S1 normal and S2 normal.   Respiratory: No accessory muscle usage. No respiratory distress.  Clear anterior fields  GI:  NTND, + BS   Musculoskeletal:  Right Lower Extremity    Profound varus of R leg noted    Knee stable with valgus stressing    Instability noted at nonunion site   TTP proximal tibia    DPN, SPN, TN sensation intact   EHL, FHL, AT, PT, peroneals, gastroc motor intact   + DP pulse   No DCT    Compartments soft   Neurological: He is alert.  Psychiatric: He has a normal mood and affect. His speech is normal. Cognition and memory are normal.     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

## 2014-12-30 ENCOUNTER — Encounter (HOSPITAL_COMMUNITY): Payer: Self-pay | Admitting: Orthopedic Surgery

## 2014-12-30 LAB — BASIC METABOLIC PANEL
ANION GAP: 10 (ref 5–15)
BUN: 9 mg/dL (ref 6–20)
CALCIUM: 8.7 mg/dL — AB (ref 8.9–10.3)
CO2: 28 mmol/L (ref 22–32)
Chloride: 104 mmol/L (ref 101–111)
Creatinine, Ser: 0.98 mg/dL (ref 0.61–1.24)
GFR calc Af Amer: >60 mL/min (ref >60)
GLUCOSE: 120 mg/dL — AB (ref 65–99)
Potassium: 3.7 mmol/L (ref 3.5–5.1)
Sodium: 142 mmol/L (ref 135–145)

## 2014-12-30 LAB — CBC
HEMATOCRIT: 28.6 % — AB (ref 39.0–52.0)
HEMOGLOBIN: 9.4 g/dL — AB (ref 13.0–17.0)
MCH: 31.9 pg (ref 26.0–34.0)
MCHC: 32.9 g/dL (ref 30.0–36.0)
MCV: 96.9 fL (ref 78.0–100.0)
Platelets: 156 10*3/uL (ref 150–400)
RBC: 2.95 MIL/uL — ABNORMAL LOW (ref 4.22–5.81)
RDW: 13.7 % (ref 11.5–15.5)
WBC: 8.5 10*3/uL (ref 4.0–10.5)

## 2014-12-30 LAB — POCT I-STAT 4, (NA,K, GLUC, HGB,HCT)
Glucose, Bld: 121 mg/dL — ABNORMAL HIGH (ref 65–99)
HCT: 31 % — ABNORMAL LOW (ref 39.0–52.0)
HEMOGLOBIN: 10.5 g/dL — AB (ref 13.0–17.0)
POTASSIUM: 3.7 mmol/L (ref 3.5–5.1)
SODIUM: 144 mmol/L (ref 135–145)

## 2014-12-30 MED ORDER — HYDROCODONE-ACETAMINOPHEN 10-325 MG PO TABS
1.0000 | ORAL_TABLET | Freq: Four times a day (QID) | ORAL | Status: DC | PRN
  Administered 2014-12-30 – 2014-12-31 (×4): 2 via ORAL
  Filled 2014-12-30 (×4): qty 2

## 2014-12-30 MED ORDER — RIVAROXABAN 20 MG PO TABS
20.0000 mg | ORAL_TABLET | Freq: Every day | ORAL | Status: DC
  Administered 2014-12-31: 20 mg via ORAL
  Filled 2014-12-30: qty 1

## 2014-12-30 NOTE — Progress Notes (Signed)
Utilization review completed.  

## 2014-12-30 NOTE — Progress Notes (Signed)
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 Other history includes CAD/MI in '09 and 03/2010, HTN, PAD, DM2, exertional dyspnea, DVT s/p IVC filter '09 s/p removal '10, cholecystectomy. Pt presents now for ORIF right tibia for malunion   Clinical Impression   Pt admitted with the above diagnoses and presents with below problem list. Pt will benefit from continued acute OT to address the below listed deficits and maximize independence with BADLs prior to d/c to venue below. PTA pt was mod I with ADLs. Pt is currently mod to max A with LB ADLs and toilet/shower transfers. Pt reports his son and son's girlfriend are working on arranging 24 hour assist at d/c. OT to continue to follow acutely.      Follow Up Recommendations  Supervision/Assistance - 24 hour;Home health OT    Equipment Recommendations  None recommended by OT    Recommendations for Other Services       Precautions / Restrictions Precautions Precautions: Fall Precaution Comments: pt fell PTA with RW on wet porch and is now leary of using RW Restrictions Weight Bearing Restrictions: Yes RLE Weight Bearing: Non weight bearing      Mobility Bed Mobility     General bed mobility comments: in recliner  Transfers Overall transfer level: Needs assistance Equipment used: Rolling walker (2 wheeled) Transfers: Sit to/from [facility] Sit to Stand: Min assist Stand pivot transfers:  Mod assist       General transfer comment: from recliner and from recliner to [facility]. Assist for balance and to maintain precautions.     Balance Overall balance assessment: Needs assistance Sitting-balance support: No upper extremity supported Sitting balance-Leahy Scale: Good     Standing balance support: Bilateral upper extremity supported;During functional activity Standing balance-Leahy Scale: Poor Standing balance comment: asssist for balance, PWB status                            ADL Overall ADL's : Needs assistance/impaired Eating/Feeding: Set up;Sitting   Grooming: Set up;Sitting   Upper Body Bathing: Set up;Sitting   Lower Body Bathing: Maximal assistance;Sitting/lateral leans;Sit to/from stand;With adaptive equipment   Upper Body Dressing : Set up;Sitting   Lower Body Dressing: Maximal assistance;Sitting/lateral leans;Sit to/from stand;With adaptive equipment   Toilet Transfer: Moderate assistance;Stand-pivot;BSC;RW Toilet Transfer Details (indicate cue type and reason): mod A for balance  Toileting- Clothing Manipulation and Hygiene: Moderate assistance;Sit to/from Nurse, children's Details (indicate cue type and reason): not attempted  Functional mobility during ADLs:  (pivotal steps only) General ADL Comments: Pt completed SPT to [facility] with mod A for balance. Pt with increased pain with movement. Discussed LB dressing technique and AE for ADLs. Discussed using tub bench at home for shower transfers and [facility] as needed. Pt states he plans to use w/c at home. Discussed need for 24 hour assist.      Vision     Perception     Praxis  Pertinent Vitals/Pain Pain Assessment: 0-10 Pain Score: 6  Pain Location: right knee Pain Intervention(s): Limited activity within patient's tolerance;Monitored during session;Repositioned     Hand Dominance     Extremity/Trunk Assessment Upper Extremity Assessment Upper Extremity Assessment:  Overall WFL for tasks assessed   Lower Extremity Assessment Lower Extremity Assessment: Defer to PT evaluation    Cervical / Trunk Assessment Cervical / Trunk Assessment: Normal   Communication Communication Communication: No difficulties   Cognition Arousal/Alertness: Awake/alert Behavior During Therapy: WFL for tasks assessed/performed Overall Cognitive Status: Within Functional Limits for tasks assessed                     General Comments       Exercises     Shoulder Instructions      Home Living Family/patient expects to be discharged to:: Private residence Living Arrangements: Alone Available Help at Discharge: Family;Available PRN/intermittently Type of Home: House Home Access: Stairs to enter [employer] of Steps: 3 Entrance Stairs-Rails: Can reach both Home Layout: One level     Bathroom Shower/Tub: Teacher, early years/pre: Standard     Home Equipment: Wheelchair - [employer] - 2 wheels;Cane - single point;Tub bench;Bedside commode;Adaptive equipment Adaptive Equipment: Reacher Additional Comments: Pt reports his son is working to arrange 24 hour care at d/c.       Prior Functioning/Environment Level of Independence: Independent with assistive device(s)        Comments: used RW or cane    OT Diagnosis: Acute pain   OT Problem List: Impaired balance (sitting and/or standing);Decreased knowledge of use of DME or AE;Decreased knowledge of precautions;Pain   OT Treatment/Interventions: Self-care/ADL training;DME and/or AE instruction;Therapeutic activities;Patient/family education;Balance training    OT Goals(Current goals can be found in the care plan section) Acute Rehab OT Goals Patient Stated Goal: return home OT Goal Formulation: With patient Time For Goal Achievement: 01/06/15 Potential to Achieve Goals: Good ADL Goals Pt Will Perform Lower Body Bathing: with adaptive equipment;sit to/from stand;with min guard  assist Pt Will Perform Lower Body Dressing: with min guard assist;with adaptive equipment;sit to/from stand Pt Will Transfer to Toilet: with min guard assist;bedside commode Pt Will Perform Toileting - Clothing Manipulation and hygiene: sitting/lateral leans;sit to/from stand;with min guard assist Pt Will Perform Tub/Shower Transfer: Tub transfer;tub bench;rolling walker  OT Frequency: Min 2X/week   Barriers to D/C:    pt reports son and son's girlfriend are working on arranging 24 hour care at d/c.        Co-evaluation              End of Session Equipment Utilized During Treatment: Gait belt;Rolling walker;Other (comment) (Pt had brace on RLE, nothing in order set)  Activity Tolerance: Patient tolerated treatment well Patient left: in chair;with call bell/phone within reach   Time: 1350-1414 OT Time Calculation (min): 24 min Charges:  OT General Charges $OT Visit: 1 Procedure OT Evaluation $Initial OT Evaluation Tier I: 1 Procedure OT Treatments $Self Care/Home Management : 8-22 mins G-Codes:    Hortencia Pilar 01/05/15, 2:45 PM

## 2014-12-30 NOTE — Progress Notes (Signed)
Spoke with Bruce [name] about holding pt metoproplol and lasix d/t blood pressure being in the low 100/50's. PA stated to hold those for the day and monitor BP.

## 2014-12-30 NOTE — Progress Notes (Signed)
Orthopedic Tech Progress Note Patient Details:  Bruce [name] 06/23/1945 [id number]  Patient ID: Bruce [name], male   DOB: 03/28/1945, 69 y.o.   MRN: [id number] Called in advanced brace order; spoke with Theodosia Paling, Ryelan Kazee 12/30/2014, 9:59 AM

## 2014-12-30 NOTE — Evaluation (Signed)
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 Other history includes CAD/MI in '09 and 03/2010, HTN, PAD, DM2, exertional dyspnea, DVT s/p IVC filter '09 s/p removal '10, cholecystectomy. Pt presents now for ORIF right tibia for malunion  Clinical Impression  Pt admitted with above diagnosis. Pt currently with functional limitations due to the deficits listed below (see PT Problem List). Pt required min A for transfers and ambulation, distance limited by dizziness. Pt feels unsafe at home with RW, therefore recommend standard walker and son will put add tennis balls for sliding.  Pt will benefit from skilled PT to increase their independence and safety with mobility to allow discharge to the venue listed below.       Follow Up Recommendations Home health PT;Supervision for mobility/OOB    Equipment Recommendations  Standard walker    Recommendations for Other Services       Precautions / Restrictions Precautions Precautions: Fall Precaution Comments: pt fell PTA with RW on wet porch and is now leary of using RW Restrictions Weight Bearing Restrictions: Yes RLE Weight Bearing: Non weight bearing      Mobility  Bed Mobility Overal bed mobility: Needs Assistance Bed Mobility: Supine to Sit     Supine to sit: Min assist     General bed mobility comments: min A to right leg for elevation and pt able to scoot rest of body to EOB  Transfers Overall transfer level:  Needs assistance Equipment used: Rolling walker (2 wheeled) Transfers: Sit to/from Stand Sit to Stand: Min assist         General transfer comment: min A to steady, pt slightly dizzy, vc's for hand position and keeping RLE NWB  Ambulation/Gait Ambulation/Gait assistance: Min assist Ambulation Distance (Feet): 3 Feet Assistive device: Rolling walker (2 wheeled) Gait Pattern/deviations: Step-to pattern     General Gait Details: pt hopped to chair with sufficient strength in bilateral UE's to support self and able to keep RLE off floor. Shoe donned on left foot before ambulating for height and cushion. KI used for transfer and then removed  Financial trader Rankin (Stroke Patients Only)       Balance Overall balance assessment: Needs assistance Sitting-balance support: No upper extremity supported Sitting balance-Leahy Scale: Good     Standing balance support: Bilateral upper extremity supported Standing balance-Leahy Scale: Poor Standing balance comment: needs UE support for safe standing since he can only stand in unilateral stance                             Pertinent Vitals/Pain Pain Assessment: 0-10 Pain Score: 8  Pain Location: right knee Pain Intervention(s): Limited activity within patient's tolerance;Monitored during session;Premedicated before session    Home Living Family/patient expects to be discharged to:: Private residence Living Arrangements: Alone Available Help at Discharge: Family;Available PRN/intermittently Type of Home: House Home Access: Stairs to enter Entrance Stairs-Rails: Can  reach both Entrance Stairs-Number of Steps: 3 Home Layout: One level Home Equipment: Wheelchair - [employer] - 2 wheels;Cane - single point Additional Comments: pt has hardwood floors and we discussed using a standard walker with tennis balls on posts for sliding since he feels that RW slides to easily for him. Son  built a ramp on house after last surgery but it has been removed, he reported that he could build it back    Prior Function Level of Independence: Independent with assistive device(s)         Comments: used RW or cane     Hand Dominance        Extremity/Trunk Assessment   Upper Extremity Assessment: Overall WFL for tasks assessed           Lower Extremity Assessment: RLE deficits/detail RLE Deficits / Details: able to lift RLE against gravity as well as hold it off floor during ambulation, not formally tested due to pain and restrictions    Cervical / Trunk Assessment: Normal  Communication   Communication: No difficulties  Cognition Arousal/Alertness: Awake/alert Behavior During Therapy: WFL for tasks assessed/performed Overall Cognitive Status: Within Functional Limits for tasks assessed                      General Comments General comments (skin integrity, edema, etc.): spoke with Dr Marcelino Scot about allowed movement and he reports pt can bend and straighten knee activiely and passively. Only movement to be avoided is resisted knee extension as tibial tuberosity partially involved in fracture site.     Exercises General Exercises - Lower Extremity Ankle Circles/Pumps: AROM;Both;10 reps;Seated Quad Sets: AROM;Both;10 reps;Seated Heel Slides: AAROM;Right;5 reps;Seated      Assessment/Plan    PT Assessment Patient needs continued PT services  PT Diagnosis Difficulty walking;Acute pain   PT Problem List Decreased strength;Decreased balance;Decreased mobility;Decreased knowledge of use of DME;Decreased knowledge of precautions;Pain  PT Treatment Interventions DME instruction;Gait training;Stair training;Functional mobility training;Therapeutic activities;Therapeutic exercise;Balance training;Patient/family education;Neuromuscular re-education   PT Goals (Current goals can be found in the Care Plan section) Acute Rehab PT Goals Patient Stated Goal: return  home PT Goal Formulation: With patient Time For Goal Achievement: 01/06/15 Potential to Achieve Goals: Good    Frequency Min 5X/week   Barriers to discharge Decreased caregiver support last time caregivers came into home after d/c, family trying to work that out now    [employer] of Session Equipment Utilized During Treatment: Gait belt;Oxygen Activity Tolerance: Patient tolerated treatment well Patient left: in chair;with call bell/phone within reach Nurse Communication: Mobility status         Time: 1638-4536 PT Time Calculation (min) (ACUTE ONLY): 49 min   Charges:   PT Evaluation $Initial PT Evaluation Tier I: 1 Procedure PT Treatments $Therapeutic Exercise: 8-22 mins $Therapeutic Activity: 8-22 mins   PT G Codes:       Leighton Roach, PT  Acute Rehab Services  [phone]  Leighton Roach 12/30/2014, 11:17 AM

## 2014-12-30 NOTE — Progress Notes (Signed)
Orthopaedic Trauma Service Progress Note  Subjective  C/o pain R leg Unable to really find a comfortable position per his report Using pca and did receive some orals last night Getting some cat naps   Tolerating diet   Review of Systems  Respiratory: Negative for shortness of breath.   Cardiovascular: Negative for chest pain.  Gastrointestinal: Negative for nausea and vomiting.  Neurological: Negative for tingling and sensory change.     Objective   BP 102/50 mmHg  Pulse 86  Temp(Src) 98.2 F (36.8 C) (Oral)  Resp 15  Ht 6' 1"  (1.854 m)  Wt 84.823 kg (187 lb)  BMI 24.68 kg/m2  SpO2 96%  Intake/Output      10/25 0701 - 10/26 0700 10/26 0701 - 10/27 0700   I.V. (mL/kg) 2500 (29.5)    IV Piggyback 500    Total Intake(mL/kg) 3000 (35.4)    Urine (mL/kg/hr) 825    Blood 300    Total Output 1125     Net +1875            Labs  Results for JAIRUS, TONNE (MRN [id number]) as of 12/30/2014 08:41  Ref. Range 12/30/2014 03:35  Sodium Latest Ref Range: 135-145 mmol/L 142  Potassium Latest Ref Range: 3.5-5.1 mmol/L 3.7  Chloride Latest Ref Range: 101-111 mmol/L 104  CO2 Latest Ref Range: 22-32 mmol/L 28  BUN Latest Ref Range: 6-20 mg/dL 9  Creatinine Latest Ref Range: 0.61-1.24 mg/dL 0.98  Calcium Latest Ref Range: 8.9-10.3 mg/dL 8.7 (L)  EGFR (Non-African Amer.) Latest Ref Range: >60 mL/min >60  EGFR (African American) Latest Ref Range: >60 mL/min >60  Glucose Latest Ref Range: 65-99 mg/dL 120 (H)  Anion gap Latest Ref Range: 5-15  10  WBC Latest Ref Range: 4.0-10.5 K/uL 8.5  RBC Latest Ref Range: 4.22-5.81 MIL/uL 2.95 (L)  Hemoglobin Latest Ref Range: 13.0-17.0 g/dL 9.4 (L)  HCT Latest Ref Range: 39.0-52.0 % 28.6 (L)  MCV Latest Ref Range: 78.0-100.0 fL 96.9  MCH Latest Ref Range: 26.0-34.0 pg 31.9  MCHC Latest Ref Range: 30.0-36.0 g/dL 32.9  RDW Latest Ref Range: 11.5-15.5 % 13.7  Platelets Latest Ref Range: 150-400 K/uL 156    Exam  Gen: lying in bed,  appears comfortable Lungs: clear anterior fields Cardiac: RRR, S1 and S2 Abd: + BS, NTND Ext:       Right Lower Extremity   Dressing and immobilizer intact  DPN, SPN, TN sensation intact  EHL, FHL, AT, PT, peroneals, gastroc motor intact  Ext warm  Swelling stable  + DP pulse    Assessment and Plan   POD/HD#: 1   69 y/o male s/p repair R tibial plateau nonunion   1.R tibial plateau nonunion s/p ORIF  NWB R leg x 6-8 weeks  ROM as tolerated R knee, except no knee extension against resistance   Ice and elevate  PT/OT evals  Dressing change tomorrow  Will order new hinged brace   2. Pain management:  Dc pca  Transition to orals   Change to norco 10/325    3. ABL anemia/Hemodynamics  Slight hypotension  CBC stable  Continue with IVF and hold ACE/HCTZ  4. Medical issues   Resume home meds   xarelto restarted today   5. DVT/PE prophylaxis:  See number 4  6. ID:   periop abx completed  7. Metabolic Bone Disease:  Vitamin d levels good  8. Activity:  PT/OT evals   9. FEN/Foley/Lines:  CHO mod diet  Continue IVF  Dc PCA    10. Dispo:  PT/OT evals  Transition to Oral pain meds  Consider dc home tomorrow/friday if doing well     Jari Pigg, PA-C [facility] [phone] (O) 12/30/2014 8:40 AM

## 2014-12-30 NOTE — Care Management Note (Signed)
Case Management Note  Patient Details  Name: DELFINO FRIESEN MRN: [id number] Date of Birth: 07-30-1945  Subjective/Objective:         S/p right tibial plateau ORIF           Action/Plan: Spoke with patient about discharge plan. Patient chose [facility]. Contacted Miranda at [facility] and set up [place]. Patient stated that he has a rolling walker, wheelchair, BSC and tub bench. Discussed rolling walker vs standard walker, patient decided he will continue to use his rolling walker due to his previous fall while using the rolling walker was on a wet surface while carrying a bag of dog food. Discussed limiting his activities and not trying to ambulate on uneven outdoor surfaces. Patient stated that his son will be staying with him initially and is hiring someone to assist him 24/7.  Will continue to follow.      Expected Discharge Date:                  Expected Discharge Plan:  [place]  In-House Referral:  NA  Discharge planning Services  CM Consult  Post Acute Care Choice:  Home Health Choice offered to:  Patient  DME Arranged:    DME Agency:     HH Arranged:  PT [place]:  [place]  Status of Service:  Completed, signed off  Medicare Important Message Given:    Date Medicare IM Given:    Medicare IM give by:    Date Additional Medicare IM Given:    Additional Medicare Important Message give by:     If discussed at [place] of Stay Meetings, dates discussed:    Additional Comments:  Nila Nephew, RN 12/30/2014, 12:16 PM

## 2014-12-31 LAB — BASIC METABOLIC PANEL
ANION GAP: 3 — AB (ref 5–15)
BUN: 7 mg/dL (ref 6–20)
CALCIUM: 8.2 mg/dL — AB (ref 8.9–10.3)
CO2: 28 mmol/L (ref 22–32)
CREATININE: 0.97 mg/dL (ref 0.61–1.24)
Chloride: 105 mmol/L (ref 101–111)
GFR calc Af Amer: >60 mL/min (ref >60)
GFR calc non Af Amer: >60 mL/min (ref >60)
GLUCOSE: 108 mg/dL — AB (ref 65–99)
Potassium: 3.6 mmol/L (ref 3.5–5.1)
Sodium: 136 mmol/L (ref 135–145)

## 2014-12-31 LAB — GLUCOSE, CAPILLARY
Glucose-Capillary: 112 mg/dL — ABNORMAL HIGH (ref 65–99)
Glucose-Capillary: 87 mg/dL (ref 65–99)

## 2014-12-31 NOTE — Progress Notes (Signed)
[facility] (OTS)  Subjective: Improving pain and function; anticipates being able to go home tomorrow and has arranged ride Tolerating regular diet  Objective:  Physical Exam Drsg clean and dry No edema, brisk CR  Assessment/Plan: Continue NWB RLE with unrestricted motion Drsg change tomorrow  Bruce [place], MD [facility] [phone] (p)

## 2014-12-31 NOTE — Progress Notes (Signed)
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 Other history includes CAD/MI in '09 and 03/2010, HTN, PAD, DM2, exertional dyspnea, DVT s/p IVC filter '09 s/p removal '10, cholecystectomy. Pt presents now for ORIF right tibia for malunion    PT Comments    Pt without c/o dizziness today. Pt reporting pain 8/10 with mobility. He was premedicated. PT to continue per POC.  Follow Up Recommendations  Home health PT;Supervision for mobility/OOB     Equipment Recommendations  Standard walker    Recommendations for Other Services       Precautions / Restrictions Precautions Precautions: Fall Precaution Comments: pt fell PTA with RW on wet porch and is now leary of using RW Required Braces or Orthoses: Other Brace/Splint Other Brace/Splint: bledsoe brace RLE for transfers and ambulation Restrictions Weight Bearing Restrictions: Yes RLE Weight Bearing: Non weight bearing    Mobility  Bed Mobility         Supine to sit: Min assist     General bed mobility comments: assist for RLE only  Transfers   Equipment used: Rolling walker (2 wheeled)   Sit to Stand: Min guard         General transfer comment: verbal cues for sequencing and NWB  Ambulation/Gait Ambulation/Gait assistance: Min guard Ambulation Distance (Feet): 10 Feet Assistive device: Rolling walker (2 wheeled) Gait Pattern/deviations: Step-to pattern Gait velocity: decreased   General Gait  Details: Shoe donned L foot for ambulation for stability. Pt demo good upper body strength for ambulation with RW NWB RLE.   Stairs            Wheelchair Mobility    Modified Rankin (Stroke Patients Only)       Balance                                    Cognition Arousal/Alertness: Awake/alert Behavior During Therapy: WFL for tasks assessed/performed Overall Cognitive Status: Within Functional Limits for tasks assessed                      Exercises      General Comments        Pertinent Vitals/Pain Pain Assessment: 0-10 Pain Score: 8  Pain Location: R knee with mobility Pain Descriptors / Indicators: Aching Pain Intervention(s): Limited activity within patient's tolerance;Monitored during session;Repositioned    Home Living                      Prior Function            PT Goals (current goals can now be found in the care plan section) Acute Rehab PT Goals Patient Stated Goal: return home PT Goal Formulation: With patient Time For Goal Achievement: 01/06/15 Potential to Achieve Goals: Good Progress towards PT goals: Progressing toward goals    Frequency  Min 5X/week    PT Plan Current plan remains appropriate    Co-evaluation  End of Session Equipment Utilized During Treatment: Gait belt Activity Tolerance: Patient tolerated treatment well Patient left: in chair;with call bell/phone within reach     Time: 0935-1001 PT Time Calculation (min) (ACUTE ONLY): 26 min  Charges:  $Gait Training: 23-37 mins                    G Codes:      Lorriane Shire 12/31/2014, 10:06 AM

## 2015-01-01 ENCOUNTER — Encounter (HOSPITAL_COMMUNITY): Payer: Self-pay | Admitting: Orthopedic Surgery

## 2015-01-01 DIAGNOSIS — S82121K Displaced fracture of lateral condyle of right tibia, subsequent encounter for closed fracture with nonunion: Secondary | ICD-10-CM | POA: Diagnosis present

## 2015-01-01 HISTORY — DX: Displaced fracture of lateral condyle of right tibia, subsequent encounter for closed fracture with nonunion: S82.121K

## 2015-01-01 LAB — BASIC METABOLIC PANEL
Anion gap: 8 (ref 5–15)
BUN: 8 mg/dL (ref 6–20)
CALCIUM: 8.9 mg/dL (ref 8.9–10.3)
CO2: 26 mmol/L (ref 22–32)
CREATININE: 1 mg/dL (ref 0.61–1.24)
Chloride: 104 mmol/L (ref 101–111)
GFR calc non Af Amer: >60 mL/min (ref >60)
GLUCOSE: 102 mg/dL — AB (ref 65–99)
Potassium: 3.9 mmol/L (ref 3.5–5.1)
Sodium: 138 mmol/L (ref 135–145)

## 2015-01-01 MED ORDER — METHOCARBAMOL 500 MG PO TABS: 500.0000 mg | ORAL_TABLET | Freq: Four times a day (QID) | ORAL | Status: DC | PRN

## 2015-01-01 MED ORDER — HYDROCODONE-ACETAMINOPHEN 10-325 MG PO TABS: 1.0000 | ORAL_TABLET | Freq: Four times a day (QID) | ORAL | Status: DC | PRN

## 2015-01-01 MED ORDER — AMOXICILLIN-POT CLAVULANATE 875-125 MG PO TABS
1.0000 | ORAL_TABLET | Freq: Two times a day (BID) | ORAL | Status: DC
Start: 2015-01-01 — End: 2015-10-26

## 2015-01-01 MED ORDER — RIVAROXABAN 20 MG PO TABS: 20.0000 mg | ORAL_TABLET | Freq: Every day | ORAL | Status: DC

## 2015-01-01 MED ORDER — AMOXICILLIN-POT CLAVULANATE 875-125 MG PO TABS
1.0000 | ORAL_TABLET | Freq: Two times a day (BID) | ORAL | Status: DC
  Administered 2015-01-01: 1 via ORAL
  Filled 2015-01-01: qty 1

## 2015-01-01 NOTE — Care Management Important Message (Signed)
Important Message  Patient Details  Name: Bruce [name] MRN: [id number] Date of Birth: 1945/07/11   Medicare Important Message Given:  Yes-second notification given    Delorse Lek 01/01/2015, 1:25 PM

## 2015-01-01 NOTE — Progress Notes (Signed)
Orthopaedic Trauma Service Progress Note  Subjective  Doing well Ready to go home No new issues  Doing better on Norco   Review of Systems  Constitutional: Negative for fever and chills.  Respiratory: Negative for shortness of breath and wheezing.   Cardiovascular: Negative for chest pain.  Gastrointestinal: Negative for nausea and vomiting.  Neurological: Negative for tingling and sensory change.     Objective   BP 104/61 mmHg  Pulse 93  Temp(Src) 100 F (37.8 C) (Oral)  Resp 16  Ht 6' 1"  (1.854 m)  Wt 84.823 kg (187 lb)  BMI 24.68 kg/m2  SpO2 93%  Intake/Output      10/27 0701 - 10/28 0700 10/28 0701 - 10/29 0700   P.O. 600    Total Intake(mL/kg) 600 (7.1)    Urine (mL/kg/hr) 850 (0.4)    Total Output 850     Net -250            Labs  Results for Bruce [name], Bruce [name] (MRN [id number]) as of 01/01/2015 09:52  Ref. Range 01/01/2015 04:32  Sodium Latest Ref Range: 135-145 mmol/L 138  Potassium Latest Ref Range: 3.5-5.1 mmol/L 3.9  Chloride Latest Ref Range: 101-111 mmol/L 104  CO2 Latest Ref Range: 22-32 mmol/L 26  BUN Latest Ref Range: 6-20 mg/dL 8  Creatinine Latest Ref Range: 0.61-1.24 mg/dL 1.00  Calcium Latest Ref Range: 8.9-10.3 mg/dL 8.9  EGFR (Non-African Amer.) Latest Ref Range: >60 mL/min >60  EGFR (African American) Latest Ref Range: >60 mL/min >60  Glucose Latest Ref Range: 65-99 mg/dL 102 (H)  Anion gap Latest Ref Range: 5-15  8    Exam  Gen: awake and alert, resting comfortably in bedside chair  Ext:       Right Lower Extremity   Hinged knee brace fitting well  Dressing removed  Some erythema proximally, no purulence  Moderate effusion   Soft tissue stable   Incisions look stable  Ext warm   Motor and sensory functions intact    POD/HD#: 24   69 y/o male s/p repair R tibial plateau nonunion   1.R tibial plateau nonunion s/p ORIF             NWB R leg x 6-8 weeks             ROM as tolerated R knee, except no knee extension against  resistance               Ice and elevate             Dressing changed  Dressing changes as needed   Hinged knee brace only when up    Will dc home on PO augmentin x 10 days  Area of erythema outlined, instructions reviewed with pt                2. Pain management:              norco 10/325                          3. ABL anemia/Hemodynamics             resume home meds a dc   4. Medical issues               home meds    xarelto   5. DVT/PE prophylaxis:             See number 4  6. ID:  periop abx completed  7. Metabolic Bone Disease:             Vitamin d levels good  8. Activity:             PT/OT evals   9. FEN/Foley/Lines:             CHO mod diet                10. Dispo:            dc home today  Follow up in 10 days, sooner if erythema worsens    Jari Pigg, PA-C [facility] [phone] (O) 01/01/2015 9:52 AM

## 2015-01-01 NOTE — Discharge Instructions (Signed)
Orthopaedic Trauma Service Discharge Instructions   General Discharge Instructions  WEIGHT BEARING STATUS: Nonweightbearing Right Leg  RANGE OF MOTION/ACTIVITY:Range of motion as tolerated Right knee   PAIN MEDICATION USE AND EXPECTATIONS  You have likely been given narcotic medications to help control your pain.  After a traumatic event that results in an fracture (broken bone) with or without surgery, it is ok to use narcotic pain medications to help control one's pain.  We understand that everyone responds to pain differently and each individual patient will be evaluated on a regular basis for the continued need for narcotic medications. Ideally, narcotic medication use should last no more than 6-8 weeks (coinciding with fracture healing).   As a patient it is your responsibility as well to monitor narcotic medication use and report the amount and frequency you use these medications when you come to your office visit.   We would also advise that if you are using narcotic medications, you should take a dose prior to therapy to maximize you participation.  IF YOU ARE ON NARCOTIC MEDICATIONS IT IS NOT PERMISSIBLE TO OPERATE A MOTOR VEHICLE (MOTORCYCLE/CAR/TRUCK/MOPED) OR HEAVY MACHINERY DO NOT MIX NARCOTICS WITH OTHER CNS (CENTRAL NERVOUS SYSTEM) DEPRESSANTS SUCH AS ALCOHOL  Diet: as you were eating previously.  Can use over the counter stool softeners and bowel preparations, such as Miralax, to help with bowel movements.  Narcotics can be constipating.  Be sure to drink plenty of fluids  Wound Care: dressing changes every other day if dressing is dry, otherwise daily dressing changes. See detailed instructions below   STOP SMOKING OR USING NICOTINE PRODUCTS!!!!  As discussed nicotine severely impairs your body's ability to heal surgical and traumatic wounds but also impairs bone healing.  Wounds and bone heal by forming microscopic blood vessels (angiogenesis) and nicotine is a vasoconstrictor  (essentially, shrinks blood vessels).  Therefore, if vasoconstriction occurs to these microscopic blood vessels they essentially disappear and are unable to deliver necessary nutrients to the healing tissue.  This is one modifiable factor that you can do to dramatically increase your chances of healing your injury.    (This means no smoking, no nicotine gum, patches, etc)  DO NOT USE NONSTEROIDAL ANTI-INFLAMMATORY DRUGS (NSAID'S)  Using products such as Advil (ibuprofen), Aleve (naproxen), Motrin (ibuprofen) for additional pain control during fracture healing can delay and/or prevent the healing response.  If you would like to take over the counter (OTC) medication, Tylenol (acetaminophen) is ok.  However, some narcotic medications that are given for pain control contain acetaminophen as well. Therefore, you should not exceed more than 4000 mg of tylenol in a day if you do not have liver disease.  Also note that there are may OTC medicines, such as cold medicines and allergy medicines that my contain tylenol as well.  If you have any questions about medications and/or interactions please ask your doctor/PA or your pharmacist.      ICE AND ELEVATE INJURED/OPERATIVE EXTREMITY  Using ice and elevating the injured extremity above your heart can help with swelling and pain control.  Icing in a pulsatile fashion, such as 20 minutes on and 20 minutes off, can be followed.    Do not place ice directly on skin. Make sure there is a barrier between to skin and the ice pack.    Using frozen items such as frozen peas works well as the conform nicely to the are that needs to be iced.  USE AN ACE WRAP OR TED HOSE FOR SWELLING  CONTROL  In addition to icing and elevation, Ace wraps or TED hose are used to help limit and resolve swelling.  It is recommended to use Ace wraps or TED hose until you are informed to stop.    When using Ace Wraps start the wrapping distally (farthest away from the body) and wrap proximally  (closer to the body)   Example: If you had surgery on your leg or thing and you do not have a splint on, start the ace wrap at the toes and work your way up to the thigh        If you had surgery on your upper extremity and do not have a splint on, start the ace wrap at your fingers and work your way up to the upper arm  IF YOU ARE IN A SPLINT OR CAST DO NOT [place]   If your splint gets wet for any reason please contact the office immediately. You may shower in your splint or cast as long as you keep it dry.  This can be done by wrapping in a cast cover or garbage back (or similar)  Do Not stick any thing down your splint or cast such as pencils, money, or hangers to try and scratch yourself with.  If you feel itchy take benadryl as prescribed on the bottle for itching  IF YOU ARE IN A CAM BOOT (BLACK BOOT)  You may remove boot periodically. Perform daily dressing changes as noted below.  Wash the liner of the boot regularly and wear a sock when wearing the boot. It is recommended that you sleep in the boot until told otherwise  CALL THE OFFICE WITH ANY QUESTIONS OR CONCERTS: [phone]     Discharge Pin Site Instructions  Dress pins daily with Kerlix roll starting on POD 2. Wrap the Kerlix so that it tamps the skin down around the pin-skin interface to prevent/limit motion of the skin relative to the pin.  (Pin-skin motion is the primary cause of pain and infection related to external fixator pin sites).  Remove any crust or coagulum that may obstruct drainage with a saline moistened gauze or soap and water.  After POD 3, if there is no discernable drainage on the pin site dressing, the interval for change can by increased to every other day.  You may shower with the fixator, cleaning all pin sites gently with soap and water.  If you have a surgical wound this needs to be completely dry and without drainage before showering.  The extremity can be lifted by the fixator to  facilitate wound care and transfers.  Notify the office/Doctor if you experience increasing drainage, redness, or pain from a pin site, or if you notice purulent (thick, snot-like) drainage.  Discharge Wound Care Instructions  Do NOT apply any ointments, solutions or lotions to pin sites or surgical wounds.  These prevent needed drainage and even though solutions like hydrogen peroxide kill bacteria, they also damage cells lining the pin sites that help fight infection.  Applying lotions or ointments can keep the wounds moist and can cause them to breakdown and open up as well. This can increase the risk for infection. When in doubt call the office.  Surgical incisions should be dressed daily.  If any drainage is noted, use one layer of adaptic, then gauze, Kerlix, and an ace wrap.  Once the incision is completely dry and without drainage, it may be left open to air out.  Showering may  begin 36-48 hours later.  Cleaning gently with soap and water.  Traumatic wounds should be dressed daily as well.    One layer of adaptic, gauze, Kerlix, then ace wrap.  The adaptic can be discontinued once the draining has ceased    If you have a wet to dry dressing: wet the gauze with saline the squeeze as much saline out so the gauze is moist (not soaking wet), place moistened gauze over wound, then place a dry gauze over the moist one, followed by Kerlix wrap, then ace wrap.

## 2015-01-01 NOTE — Discharge Summary (Signed)
[facility] (OT[facility])  Patient ID: LESSLIE MCKEEHAN MRN: [id number] DOB/AGE: 1945-12-19 69 y.o.  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 Carola Frost Repair right tibial plateau nonunion Harvest of RIA right femur Removal of hardware right tibia  Discharged Condition: good  Hospital Course:   The patient very pleasant 69 year old male who sustained a devastating injury August 2015. Initially treated an external fixator due to severe soft tissue swelling which precluded ORIF. Patient also had vascular repair at that time. Patient unfortunately developed a symptomatic nonunion of his right tibial plateau. Patient taken the operating room on the date noted above for the procedure described above. Patient tolerated procedure well. At surgery transferred to the orthopedic floor for continued observation and pain control. Patient was started back on his Xarelto postoperatively given his history of DVT which is being anticoagulated for. Patient's hospital stay was relatively uncomplicated. It was expected length and clinical course. Patient did have a tremendous amount of pain that was adequately controlled initially with IV pain medication. He was gradually transitioned to oral pain medication. We did titrate his pain medications accordingly. We did discover that the  Norco was working better for him than Percocet and as such she was transitioned to that with good effect. He worked with therapy well starting on postoperative day #1. No acute issues noted during his hospital stay. Patient was deemed to be stable for discharge on 01/01/2015. He is stable at the time of discharge. Tolerating diet and voiding without difficulty.  Consults: None  Significant Diagnostic Studies: labs:   Results for RAFIEL, MECCA (MRN [id number]) as of 01/02/2015 13:11  Ref. Range 01/01/2015 04:32  Sodium Latest Ref Range: 135-145 mmol/L 138  Potassium Latest Ref Range: 3.5-5.1 mmol/L 3.9  Chloride Latest Ref Range: 101-111 mmol/L 104  CO2 Latest Ref Range: 22-32 mmol/L 26  BUN Latest Ref Range: 6-20 mg/dL 8  Creatinine Latest Ref Range: 0.61-1.24 mg/dL 9.58  Calcium Latest Ref Range: 8.9-10.3 mg/dL 8.9  EGFR (Non-African Amer.) Latest Ref Range: >60 mL/min >60  EGFR (African American) Latest Ref Range: >60 mL/min >60  Glucose Latest Ref Range: 65-99 mg/dL 548 (H)  Anion gap Latest Ref Range: 5-15  8    Treatments: IV hydration, antibiotics: Ancef, analgesia: acetaminophen, Dilaudid and Norco, anticoagulation: Xarelto, therapies: PT, OT and RN and surgery: As above  Discharge Exam:   Subjective  Doing well Ready to go home No new issues   Doing better on Norco   Review of Systems  Constitutional: Negative for fever and chills.  Respiratory: Negative for shortness of breath and wheezing.   Cardiovascular: Negative for chest pain.  Gastrointestinal: Negative for nausea and vomiting.  Neurological: Negative for tingling and sensory change.     Objective   BP 104/61 mmHg  Pulse 93  Temp(Src) 100 F (37.8 C) (Oral)  Resp 16  Ht 6\' 1"  (1.854 m)  Wt 84.823 kg (  187 lb)  BMI 24.68 kg/m2  SpO2 93%  Intake/Output       10/27 0701 - 10/28 0700 10/28 0701 - 10/29 0700    P.O. 600     Total Intake(mL/kg) 600 (7.1)     Urine (mL/kg/hr) 850 (0.4)     Total  Output 850      Net -250              Labs  Results for [name], ROUTH (MRN [id number]) as of 01/01/2015 09:52   Ref. Range  01/01/2015 04:32   Sodium  Latest Ref Range: 135-145 mmol/L  138   Potassium  Latest Ref Range: 3.5-5.1 mmol/L  3.9   Chloride  Latest Ref Range: 101-111 mmol/L  104   CO2  Latest Ref Range: 22-32 mmol/L  26   BUN  Latest Ref Range: 6-20 mg/dL  8   Creatinine  Latest Ref Range: 0.61-1.24 mg/dL  1.00   Calcium  Latest Ref Range: 8.9-10.3 mg/dL  8.9   EGFR (Non-African Amer.)  Latest Ref Range: >60 mL/min  >60   EGFR (African American)  Latest Ref Range: >60 mL/min  >60   Glucose  Latest Ref Range: 65-99 mg/dL  102 (H)   Anion gap  Latest Ref Range: 5-15   8     Exam  Gen: awake and alert, resting comfortably in bedside chair   Ext:        Right Lower Extremity               Hinged knee brace fitting well             Dressing removed             Some erythema proximally, no purulence             Moderate effusion               Soft tissue stable               Incisions look stable             Ext warm                      Motor and sensory functions intact    POD/HD#: 19   69 y/o male s/p repair R tibial plateau nonunion   1.R tibial plateau nonunion s/p ORIF             NWB R leg x 6-8 weeks             ROM as tolerated R knee, except no knee extension against resistance               Ice and elevate             Dressing changed             Dressing changes as needed               Hinged knee brace only when up                          Will dc home on PO augmentin x 10 days             Area of erythema outlined, instructions reviewed with pt                 2. Pain management:  norco 10/325                          3. ABL anemia/Hemodynamics             resume home meds a dc   4. Medical issues               home meds                           xarelto   5. DVT/PE prophylaxis:             See number 4  6. ID:                periop abx completed  7. Metabolic Bone Disease:             Vitamin d levels good  8. Activity:             PT/OT evals   9. FEN/Foley/Lines:             CHO mod diet                10. Dispo:            dc home today             Follow up in 10 days, sooner if erythema worsens    Disposition: 01-Home or Self Care      Discharge Instructions    Call MD / Call 911    Complete by:  As directed   If you experience chest pain or shortness of breath, CALL 911 and be transported to the hospital emergency room.  If you develope a fever above 101 F, pus (white drainage) or increased drainage or redness at the wound, or calf pain, call your surgeon's office.     Constipation Prevention    Complete by:  As directed   Drink plenty of fluids.  Prune juice may be helpful.  You may use a stool softener, such as Colace (over the counter) 100 mg twice a day.  Use MiraLax (over the counter) for constipation as needed.     Diet Carb Modified    Complete by:  As directed      Discharge instructions    Complete by:  As directed   [facility] Discharge Instructions   General Discharge Instructions  WEIGHT BEARING STATUS: Nonweightbearing Right Leg  RANGE OF MOTION/ACTIVITY:Range of motion as tolerated Right knee   PAIN MEDICATION USE AND EXPECTATIONS  You have likely been given narcotic medications to help control your pain.  After a traumatic event that results in an fracture (broken bone) with or without surgery, it is ok to use narcotic pain medications to help control one's pain.  We understand that everyone responds to pain differently and each individual patient will be evaluated on a regular basis for the continued need for narcotic medications. Ideally, narcotic medication use should last no more than 6-8 weeks (coinciding with fracture healing).   As a patient it is your responsibility as well to monitor narcotic medication use and report the amount and frequency  you use these medications when you come to your office visit.   We would also advise that if you are using narcotic medications, you should take a dose prior to therapy to maximize you participation.  IF YOU ARE ON NARCOTIC MEDICATIONS IT IS NOT PERMISSIBLE TO OPERATE  A MOTOR VEHICLE (MOTORCYCLE/CAR/TRUCK/MOPED) OR HEAVY MACHINERY DO NOT MIX NARCOTICS WITH OTHER CNS (CENTRAL NERVOUS SYSTEM) DEPRESSANTS SUCH AS ALCOHOL  Diet: as you were eating previously.  Can use over the counter stool softeners and bowel preparations, such as Miralax, to help with bowel movements.  Narcotics can be constipating.  Be sure to drink plenty of fluids  Wound Care: dressing changes every other day if dressing is dry, otherwise daily dressing changes. See detailed instructions below   STOP SMOKING OR USING NICOTINE PRODUCTS!!!!  As discussed nicotine severely impairs your body's ability to heal surgical and traumatic wounds but also impairs bone healing.  Wounds and bone heal by forming microscopic blood vessels (angiogenesis) and nicotine is a vasoconstrictor (essentially, shrinks blood vessels).  Therefore, if vasoconstriction occurs to these microscopic blood vessels they essentially disappear and are unable to deliver necessary nutrients to the healing tissue.  This is one modifiable factor that you can do to dramatically increase your chances of healing your injury.    (This means no smoking, no nicotine gum, patches, etc)  DO NOT USE NONSTEROIDAL ANTI-INFLAMMATORY DRUGS (NSAID'S)  Using products such as Advil (ibuprofen), Aleve (naproxen), Motrin (ibuprofen) for additional pain control during fracture healing can delay and/or prevent the healing response.  If you would like to take over the counter (OTC) medication, Tylenol (acetaminophen) is ok.  However, some narcotic medications that are given for pain control contain acetaminophen as well. Therefore, you should not exceed more than 4000 mg of tylenol in a day if  you do not have liver disease.  Also note that there are may OTC medicines, such as cold medicines and allergy medicines that my contain tylenol as well.  If you have any questions about medications and/or interactions please ask your doctor/PA or your pharmacist.      ICE AND ELEVATE INJURED/OPERATIVE EXTREMITY  Using ice and elevating the injured extremity above your heart can help with swelling and pain control.  Icing in a pulsatile fashion, such as 20 minutes on and 20 minutes off, can be followed.    Do not place ice directly on skin. Make sure there is a barrier between to skin and the ice pack.    Using frozen items such as frozen peas works well as the conform nicely to the are that needs to be iced.  USE AN ACE WRAP OR TED HOSE FOR SWELLING CONTROL  In addition to icing and elevation, Ace wraps or TED hose are used to help limit and resolve swelling.  It is recommended to use Ace wraps or TED hose until you are informed to stop.    When using Ace Wraps start the wrapping distally (farthest away from the body) and wrap proximally (closer to the body)   Example: If you had surgery on your leg or thing and you do not have a splint on, start the ace wrap at the toes and work your way up to the thigh        If you had surgery on your upper extremity and do not have a splint on, start the ace wrap at your fingers and work your way up to the upper arm  IF YOU ARE IN A SPLINT OR CAST DO NOT [place]   If your splint gets wet for any reason please contact the office immediately. You may shower in your splint or cast as long as you keep it dry.  This can be done by wrapping in a  cast cover or garbage back (or similar)  Do Not stick any thing down your splint or cast such as pencils, money, or hangers to try and scratch yourself with.  If you feel itchy take benadryl as prescribed on the bottle for itching  IF YOU ARE IN A CAM BOOT (BLACK BOOT)  You may remove boot periodically.  Perform daily dressing changes as noted below.  Wash the liner of the boot regularly and wear a sock when wearing the boot. It is recommended that you sleep in the boot until told otherwise  CALL THE OFFICE WITH ANY QUESTIONS OR CONCERTS: [phone]     Discharge Pin Site Instructions  Dress pins daily with Kerlix roll starting on POD 2. Wrap the Kerlix so that it tamps the skin down around the pin-skin interface to prevent/limit motion of the skin relative to the pin.  (Pin-skin motion is the primary cause of pain and infection related to external fixator pin sites).  Remove any crust or coagulum that may obstruct drainage with a saline moistened gauze or soap and water.  After POD 3, if there is no discernable drainage on the pin site dressing, the interval for change can by increased to every other day.  You may shower with the fixator, cleaning all pin sites gently with soap and water.  If you have a surgical wound this needs to be completely dry and without drainage before showering.  The extremity can be lifted by the fixator to facilitate wound care and transfers.  Notify the office/Doctor if you experience increasing drainage, redness, or pain from a pin site, or if you notice purulent (thick, snot-like) drainage.  Discharge Wound Care Instructions  Do NOT apply any ointments, solutions or lotions to pin sites or surgical wounds.  These prevent needed drainage and even though solutions like hydrogen peroxide kill bacteria, they also damage cells lining the pin sites that help fight infection.  Applying lotions or ointments can keep the wounds moist and can cause them to breakdown and open up as well. This can increase the risk for infection. When in doubt call the office.  Surgical incisions should be dressed daily.  If any drainage is noted, use one layer of adaptic, then gauze, Kerlix, and an ace wrap.  Once the incision is completely dry and without drainage, it may be left  open to air out.  Showering may begin 36-48 hours later.  Cleaning gently with soap and water.  Traumatic wounds should be dressed daily as well.    One layer of adaptic, gauze, Kerlix, then ace wrap.  The adaptic can be discontinued once the draining has ceased    If you have a wet to dry dressing: wet the gauze with saline the squeeze as much saline out so the gauze is moist (not soaking wet), place moistened gauze over wound, then place a dry gauze over the moist one, followed by Kerlix wrap, then ace wrap.     Driving restrictions    Complete by:  As directed   No driving     Increase activity slowly as tolerated    Complete by:  As directed      Non weight bearing    Complete by:  As directed             Medication List    STOP taking these medications        HYDROcodone-acetaminophen 5-325 MG tablet  Commonly known as:  NORCO/VICODIN  Replaced by:  HYDROcodone-acetaminophen 10-325  MG tablet     oxyCODONE-acetaminophen 5-325 MG tablet  Commonly known as:  PERCOCET/ROXICET      TAKE these medications        ALPRAZolam 1 MG tablet  Commonly known as:  XANAX  Take 1 mg by mouth 2 (two) times daily. Takes at 0800  And 2000     amoxicillin-clavulanate 875-125 MG tablet  Commonly known as:  AUGMENTIN  Take 1 tablet by mouth every 12 (twelve) hours.     atorvastatin 80 MG tablet  Commonly known as:  LIPITOR  Take 80 mg by mouth at bedtime.     benazepril-hydrochlorthiazide 20-12.5 MG tablet  Commonly known as:  LOTENSIN HCT  Take 1 tablet by mouth daily.     CALCIUM CITRATE PO  Take 250 mg by mouth at bedtime.     docusate sodium 100 MG capsule  Commonly known as:  COLACE  Take 100 mg by mouth 2 (two) times daily.     FISH OIL PO  Take 1 capsule by mouth daily.     fluticasone 50 MCG/ACT nasal spray  Commonly known as:  FLONASE  Place 1 spray into both nostrils daily.     furosemide 20 MG tablet  Commonly known as:  LASIX  Take 20 mg by mouth daily.      HYDROcodone-acetaminophen 10-325 MG tablet  Commonly known as:  NORCO  Take 1-2 tablets by mouth every 6 (six) hours as needed for severe pain.     isosorbide mononitrate 60 MG 24 hr tablet  Commonly known as:  IMDUR  Take 60 mg by mouth at bedtime.     loratadine 10 MG tablet  Commonly known as:  CLARITIN  Take 10 mg by mouth at bedtime.     methocarbamol 500 MG tablet  Commonly known as:  ROBAXIN  Take 1-2 tablets (500-1,000 mg total) by mouth every 6 (six) hours as needed for muscle spasms.     metoprolol succinate 50 MG 24 hr tablet  Commonly known as:  TOPROL-XL  Take 1 tablet (50 mg total) by mouth daily.     montelukast 10 MG tablet  Commonly known as:  SINGULAIR  Take 10 mg by mouth at bedtime.     multivitamin tablet  Take 1 tablet by mouth daily. For Men     nitroGLYCERIN 0.4 MG SL tablet  Commonly known as:  NITROSTAT  Place 0.4 mg under the tongue every 5 (five) minutes as needed for chest pain.     omeprazole 20 MG capsule  Commonly known as:  PRILOSEC  Take 1 capsule (20 mg total) by mouth daily.     rivaroxaban 20 MG Tabs tablet  Commonly known as:  XARELTO  Take 20 mg by mouth daily with supper.     rivaroxaban 20 MG Tabs tablet  Commonly known as:  XARELTO  Take 1 tablet (20 mg total) by mouth daily with supper.     traMADol 50 MG tablet  Commonly known as:  ULTRAM  Take 50 mg by mouth 2 (two) times daily.     VITAMIN D3 PO  Take 1,000 tablets by mouth daily.       Follow-up Information    Follow up with [facility].   Why:  They will contact you to schedule home therapy visits.   Contact information:   [address] [place] [address] [phone]       Follow up with Rozanna Box, MD In 10 days.  Specialty:  Orthopedic Surgery   Why:  For suture removal, For wound re-check   Contact information:   [address] [place] [address] [phone]       Discharge Instructions and  Plan: 70 y/o male s/p repair R tibial plateau nonunion   1.R tibial plateau nonunion s/p ORIF             NWB R leg x 6-8 weeks             ROM as tolerated R knee, except no knee extension against resistance               Ice and elevate                          Dressing changes as needed               Hinged knee brace only when up                          Will dc home on PO augmentin x 10 days             Area of erythema outlined, instructions reviewed with pt                 2. Pain management:              norco 10/325                          3. ABL anemia/Hemodynamics             resume home meds a dc   4. Medical issues               home meds                           xarelto   5. DVT/PE prophylaxis:             See number 4  6. ID:               periop abx completed  7. Metabolic Bone Disease:             Vitamin d levels good  8. Activity:             PT/OT evals   9. FEN/Foley/Lines:             CHO mod diet               10. Dispo:            dc home today             Follow up in 10 days, sooner if erythema worsens     Signed:  Jari Pigg, PA-C [facility] [phone] (P) 01/02/2015, 1:05 PM

## 2015-01-01 NOTE — Progress Notes (Signed)
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 Other history includes CAD/MI in '09 and 03/2010, HTN, PAD, DM2, exertional dyspnea, DVT s/p IVC filter '09 s/p removal '10, cholecystectomy. Pt presents now for ORIF right tibia for malunion    PT Comments    Pt is progressing well. Plan is for d/c home today or tomorrow.  Follow Up Recommendations  Home health PT;Supervision for mobility/OOB     Equipment Recommendations  Standard walker    Recommendations for Other Services       Precautions / Restrictions Precautions Precautions: Fall Precaution Comments: pt fell PTA with RW on wet porch and is now leary of using RW Required Braces or Orthoses: Other Brace/Splint Other Brace/Splint: bledsoe brace RLE for transfers and ambulation Restrictions RLE Weight Bearing: Non weight bearing    Mobility  Bed Mobility         Supine to sit: Supervision        Transfers   Equipment used: Rolling walker (2 wheeled)   Sit to Stand: Supervision Stand pivot transfers: Min guard       General transfer comment: verbal cues for sequencing and NWB  Ambulation/Gait Ambulation/Gait assistance: Min guard Ambulation Distance (Feet): 150 Feet Assistive device: Rolling walker (2 wheeled) Gait Pattern/deviations: Step-to pattern Gait velocity: decreased   General Gait Details: Shoe donned L foot for ambulation for stability. Pt demo good upper body strength for  ambulation with RW NWB RLE.   Stairs            Wheelchair Mobility    Modified Rankin (Stroke Patients Only)       Balance                                    Cognition Arousal/Alertness: Awake/alert Behavior During Therapy: WFL for tasks assessed/performed Overall Cognitive Status: Within Functional Limits for tasks assessed                      Exercises      General Comments        Pertinent Vitals/Pain Pain Assessment: 0-10 Pain Score: 10-Worst pain ever Pain Location: RLE with mobility Pain Descriptors / Indicators: Aching;Burning Pain Intervention(s): Monitored during session;Repositioned (Pt declining pain meds at this time.)    Home Living                      Prior Function            PT Goals (current goals can now be found in the care plan section) Acute Rehab PT Goals Patient Stated Goal: return home PT Goal Formulation: With patient Time For Goal Achievement: 01/06/15 Potential to Achieve Goals: Good Progress towards PT goals: Progressing toward goals    Frequency  Min 5X/week    PT Plan Current plan remains appropriate    Co-evaluation             End of Session  Equipment Utilized During Treatment: Gait belt;Other (comment) (bledsoe brace RLE) Activity Tolerance: Patient tolerated treatment well Patient left: in chair;with call bell/phone within reach     Time: 1499-6924 PT Time Calculation (min) (ACUTE ONLY): 24 min  Charges:  $Gait Training: 23-37 mins                    G Codes:      Lorriane Shire 01/01/2015, 9:44 AM

## 2015-01-01 NOTE — Progress Notes (Signed)
Educated pt on tub bench transfer technique, pt declined to practice stating that he had been using the tub bench for showering after accident and feels comfortable with technique upon return home; pt able to verbalize technique. Educated on use of 3 in 1 over toilet, compensatory strategies for LB ADLs, need for close supervision during ADLs and functional mobility upon return home; pt verbalized understanding. Pt with no further questions or concerns for OT at this time. Will continue to follow acutely.  Truman Hayward, M.S., OTR/L Pager: [phone] 01/01/15, 11:21 AM

## 2015-01-06 NOTE — Op Note (Signed)
Bruce [name], Bruce [name] NO.:  [id number]  MEDICAL RECORD NO.:  [id number]  LOCATION:  [id number]                        FACILITY:  [facility]  PHYSICIAN:  Astrid Divine. Marcelino Scot, M.D. DATE OF BIRTH:  12/19/1945  DATE OF PROCEDURE:  12/29/2014 DATE OF DISCHARGE:  01/01/2015                              OPERATIVE REPORT   PREOPERATIVE DIAGNOSIS: 1. Right proximal tibia nonunion. 2. 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  POSTOPERATIVE DIAGNOSIS: 1. Right proximal tibia nonunion. 2. 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  PROCEDURES: 1. Open reduction and internal fixation of right tibial plateau     fracture. 2. Repair of tibial nonunion with autografting using reamed     intramedullary aspiration. 3. Osteotomy of the right tibia.  SURGEON:  Astrid Divine. Marcelino Scot, M.D.  ASSISTANTS: 1. Jari Pigg, PA-C. 2. PA student.  ANESTHESIA:  General.  COMPLICATIONS:  None.  I/O:  2000 mL of crystalloid, colloid 500 mL/UOP 500 mL, EBL 300 mL.  TOURNIQUET:  None.  DISPOSITION:  To the PACU.  CONDITION:  Stable.  BRIEF SUMMARY OF INDICATION FOR PROCEDURE:  Bruce [name] is a very pleasant 69 year old gentleman, who underwent operative repair of a vessel nearly 2 years ago at the time of a crush injury to the right proximal tibia.  The injury was not reconstructible because of the severity of soft-tissue injury, and the patient went on to develop a mobile painful nonunion both of the proximal tibia as well as of the articular surface.  We attempted a variety of measures at stabilization to get union without surgery and also had him carefully evaluated by the total joint surgeon, who recommended operative repair in order to obtain a stable platform into which a total joint could be placed.  I discussed with the patient the risks and benefits of surgery including possibility of infection, nerve injury, vessel injury, DVT, PE, heart  attack, stroke, particularly given his cardiac history and many others.  The patient acknowledged these risks and wished to proceed.  In addition, we specifically discussed the risks and benefits of reamed intramedullary aspiration.  BRIEF SUMMARY OF PROCEDURE:  The patient was given Ancef preoperatively. Taken to the operating room, where general anesthesia was induced. Because of the anticipated eventual need for total joint arthroplasty, nearly a straight midline incision was made.  I was able to access the top of the fracture line in the articular surface of the bicondylar plateau and scraped out in this manner; however, I could not fully access the nonunion site.  Distally, I was able to go medially retrograde to explore this area, but again could not fully access and clean out the cavity.  Consequently, I then took an osteotome to perform an osteotomy and thereby create an exposure that would facilitate repair of the nonunion distally and also repair of the tibial plateau.  This would not normally be required.  My assistant carefully protected the neurovascular structures.  This was performed.  The cavity was cleaned with curette and lavaged.  I then mobilized the lateral meniscus by incising the coronary ligament and was able to restore better condylar width across the articular surface.  This bicondylar portion of the plateau was then secured with multiple K-wires.  After reconstructing the bicondylar tibial plateau portion, I then turned attention distally.  The nonunion site again was thoroughly cleaned with curette and lavaged. I then had to manipulate and translate the tibia to achieve appropriate alignment.  This included posterior exploration and careful curettage along the capsule.  I could not, however, completely correct translation in the sagittal plane with the proximal tibial plateau component remaining translated anteriorly.  We did, however, correct the  alignment and restore these on both planes.  I then placed the guidewire into the femur for collection of reamed intramedullary aspirate and used this device from [employer], obtaining about 25-30 mL of graft.  The allograft was mixed with cancellous chips and then placed into the cavity from distal to proximal packing it in quite robustly into this very large cavity.  Once all the autograft was firmly packed into the place, the cavity was filled completely.  I placed a lateral plate from [employer] and a medial plate for additional structural support.  Both of which should be able to be removed from the primary anterior incision, although I did have an auxiliary incision more laterally through which I placed the [facility] for compression of the tibial plateau in the first component of the case, and then I used this to place some of the instrumentation into the proximal tibia placing all the proximal screws and then the distal screws with again the buttress plate medially, wounds were irrigated thoroughly, closed in standard layered fashion using Vicryl and nylon.  Assistant was necessary throughout to expose and develop the fracture site of the bicondylar plateau of the nonunion of the tibia and to obtain the graft to produce provisional reduction and fixation and definitive as well also assisted with wound closure.  PROGNOSIS:  Bruce [name] had a very large magnitude injury to his right tibia and will require aggressive range of motion.  We are hopeful that in spite of the size of this cavity with the autograft and technique, could go on to unite and establish a stable platform.  We were pleased with this alignment and given his meniscus is intact, he may have a period of excellent function prior to requiring his total knee.  He will be on formal pharmacologic DVT prophylaxis.     Astrid Divine. Marcelino Scot, M.D.     MHH/MEDQ  D:  01/05/2015  T:  01/06/2015  Job:  [id number]

## 2015-03-09 ENCOUNTER — Emergency Department: Payer: Medicare HMO

## 2015-03-09 ENCOUNTER — Encounter: Payer: Self-pay | Admitting: Emergency Medicine

## 2015-03-09 DIAGNOSIS — F17200 Nicotine dependence, unspecified, uncomplicated: Secondary | ICD-10-CM | POA: Insufficient documentation

## 2015-03-09 DIAGNOSIS — E119 Type 2 diabetes mellitus without complications: Secondary | ICD-10-CM | POA: Insufficient documentation

## 2015-03-09 DIAGNOSIS — I10 Essential (primary) hypertension: Secondary | ICD-10-CM | POA: Insufficient documentation

## 2015-03-09 DIAGNOSIS — J441 Chronic obstructive pulmonary disease with (acute) exacerbation: Secondary | ICD-10-CM | POA: Insufficient documentation

## 2015-03-09 DIAGNOSIS — R0602 Shortness of breath: Secondary | ICD-10-CM | POA: Diagnosis present

## 2015-03-09 LAB — COMPREHENSIVE METABOLIC PANEL
ALBUMIN: 4.6 g/dL (ref 3.5–5.0)
ALT: 28 U/L (ref 17–63)
ANION GAP: 8 (ref 5–15)
AST: 29 U/L (ref 15–41)
Alkaline Phosphatase: 85 U/L (ref 38–126)
BUN: 15 mg/dL (ref 6–20)
CHLORIDE: 107 mmol/L (ref 101–111)
CO2: 26 mmol/L (ref 22–32)
CREATININE: 0.97 mg/dL (ref 0.61–1.24)
Calcium: 9.9 mg/dL (ref 8.9–10.3)
GFR calc non Af Amer: >60 mL/min (ref >60)
GLUCOSE: 111 mg/dL — AB (ref 65–99)
Potassium: 3.2 mmol/L — ABNORMAL LOW (ref 3.5–5.1)
SODIUM: 141 mmol/L (ref 135–145)
Total Bilirubin: 0.9 mg/dL (ref 0.3–1.2)
Total Protein: 7.6 g/dL (ref 6.5–8.1)

## 2015-03-09 LAB — APTT: aPTT: 26 seconds (ref 24–36)

## 2015-03-09 LAB — CBC
HCT: 39.4 % — ABNORMAL LOW (ref 40.0–52.0)
HEMOGLOBIN: 13.2 g/dL (ref 13.0–18.0)
MCH: 30.8 pg (ref 26.0–34.0)
MCHC: 33.4 g/dL (ref 32.0–36.0)
MCV: 92.1 fL (ref 80.0–100.0)
PLATELETS: 243 10*3/uL (ref 150–440)
RBC: 4.27 MIL/uL — AB (ref 4.40–5.90)
RDW: 14.2 % (ref 11.5–14.5)
WBC: 11.2 10*3/uL — ABNORMAL HIGH (ref 3.8–10.6)

## 2015-03-09 LAB — PROTIME-INR
INR: 1.11
Prothrombin Time: 14.5 seconds (ref 11.4–15.0)

## 2015-03-09 LAB — TROPONIN I

## 2015-03-09 NOTE — ED Notes (Signed)
Pt arrived to ED with family with reports of shortness of breath that has been increasing since noon today. Pt family reports "pt got upset with his son today and has trouble breathing since". Pt has PMH of anxiety but is out of prescribed mediation. Pt alert, respirations even and unlabored, skin warm and dry.

## 2015-03-09 NOTE — ED Notes (Signed)
Pt in by ems with co feeling like someone is breaking into his house.

## 2015-03-10 ENCOUNTER — Emergency Department
Admission: EM | Admit: 2015-03-10 | Discharge: 2015-03-10 | Discharge status: Against Medical Advice | Payer: Medicare HMO | Attending: Emergency Medicine | Admitting: Emergency Medicine

## 2015-03-10 HISTORY — DX: Chronic obstructive pulmonary disease, unspecified: J44.9

## 2015-03-10 NOTE — ED Notes (Signed)
Pt's family member reports leaving now due to long wait; encouraged to stay and be seen by ED provider; reports will f/u with PCP this morning; instructed to return immed for new or worsening symptoms; family voices understanding

## 2015-03-18 DIAGNOSIS — R441 Visual hallucinations: Secondary | ICD-10-CM

## 2015-03-18 DIAGNOSIS — R440 Auditory hallucinations: Secondary | ICD-10-CM | POA: Insufficient documentation

## 2015-03-18 HISTORY — DX: Auditory hallucinations: R44.0

## 2015-03-18 HISTORY — DX: Visual hallucinations: R44.1

## 2015-06-14 ENCOUNTER — Other Ambulatory Visit: Payer: Self-pay | Admitting: Nurse Practitioner

## 2015-06-14 ENCOUNTER — Ambulatory Visit
Admission: RE | Admit: 2015-06-14 | Discharge: 2015-06-14 | Disposition: A | Discharge status: Home / Self Care | Payer: Medicare HMO | Source: Ambulatory Visit | Attending: Nurse Practitioner | Admitting: Nurse Practitioner

## 2015-06-14 DIAGNOSIS — M79602 Pain in left arm: Secondary | ICD-10-CM

## 2015-10-26 ENCOUNTER — Ambulatory Visit (INDEPENDENT_AMBULATORY_CARE_PROVIDER_SITE_OTHER): Payer: Medicare HMO | Admitting: Psychiatry

## 2015-10-26 ENCOUNTER — Encounter: Payer: Self-pay | Admitting: Psychiatry

## 2015-10-26 VITALS — BP 101/65 | HR 89 | Temp 97.3°F | Ht 73.0 in | Wt 189.8 lb

## 2015-10-26 DIAGNOSIS — F411 Generalized anxiety disorder: Secondary | ICD-10-CM | POA: Diagnosis not present

## 2015-10-26 DIAGNOSIS — F331 Major depressive disorder, recurrent, moderate: Secondary | ICD-10-CM

## 2015-10-26 MED ORDER — ALPRAZOLAM 0.25 MG PO TABS: 0.2500 mg | ORAL_TABLET | Freq: Every day | ORAL | 1 refills | Status: DC | PRN

## 2015-10-26 MED ORDER — TRAZODONE HCL 50 MG PO TABS: 50.0000 mg | ORAL_TABLET | Freq: Every day | ORAL | 1 refills | Status: DC

## 2015-10-26 NOTE — Progress Notes (Signed)
Psychiatric Initial Adult Assessment   Patient Identification: Bruce [name] MRN:  [id number] Date of Evaluation:  10/26/2015 Referral Source: [facility]  Chief Complaint:   Chief Complaint    Establish Care; Anxiety; Stress; Panic Attack; Depression; Fatigue     Visit Diagnosis:    ICD-9-CM ICD-10-CM   1. GAD (generalized anxiety disorder) 300.02 F41.1   2. MDD (major depressive disorder), recurrent episode, moderate (HCC) 296.32 F33.1     History of Present Illness:   Pt  is a 70 year old male who was referred from the [facility]. He reported that he was following [facility], [place] for at least 5 years but she suddenly decided to stop prescribing him Xanax which he has been taking on a when necessary basis. Patient reported that he currently lives by himself. He reported that he has anxiety and wants to continue the medication. He reported that he does not know why he is here. He appeared pleasant and cooperative during the interview. He reported that he currently lives with his son. He spends time around the house doing small stuff. He was discussing in detail about his dog. He appeared calm and cooperative during the interview. He reported that he feels depressed at times. He has poor sleep and was prescribed Vistaril which is not helpful. He reported that he is awake throughout the night and is able to sleep for a few hours in the morning. He currently denied having any perceptual disturbances. He denied having any suicidal ideations or plans.  Associated Signs/Symptoms: Depression Symptoms:  depressed mood, insomnia, fatigue, difficulty concentrating, anxiety, loss of energy/fatigue, disturbed sleep, (Hypo) Manic Symptoms:  Impulsivity, Irritable Mood, Anxiety Symptoms:  Excessive Worry, Psychotic Symptoms:  denied PTSD Symptoms: Negative NA  Past Psychiatric History:  Patient has history of hallucinations from Singulair which was stopped. He has never been  admitted to a psychiatric hospital. He denied any history of suicide attempts.  Previous Psychotropic Medications: Xanax   Substance Abuse History in the last 12 months:  No.  Consequences of Substance Abuse: Negative NA  Past Medical History:  Past Medical History:  Diagnosis Date  . Anginal pain ([place])    Left side if chest ,NTG  relieves chaes apin 12/01/13  . Arthritis   . 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 COPD (chronic obstructive pulmonary disease) ([place])   . Coronary artery disease   . Diabetes mellitus without complication ([place])    does not take medication or test blood sugar  . History of blood transfusion   . Hypertension   . Myocardial infarction [facility])    '09 AND '12  . Peripheral vascular disease ([place])   . Shortness of breath    With exertion .  Marland Kitchen UTI (urinary tract infection)    frequent UTI    Past Surgical History:  Procedure Laterality Date  . CARDIAC CATHETERIZATION    . CHOLECYSTECTOMY    . EXTERNAL FIXATION LEG Right 10/06/2013   Procedure: CLOSED REDUCTION RIGHT TIBIAL PLATEAU FRACTURE, EXTERNAL FIXATION RIGHT LEG, PLACEMENT OF WOUND VAC;  Surgeon: Rozanna Box, MD;  Location: [place] [facility];  Service: Orthopedics;  Laterality: Right;  . FEMORAL-POPLITEAL BYPASS GRAFT Right 10/06/2013   Procedure: RIGHT POPLITEAL-POPLITEAL ARTERY BYPASS GRAFT;  Surgeon: Rosetta Posner, MD;  Location: [facility];  Service: Vascular;  Laterality: Right;  . HARDWARE REMOVAL Right 12/02/2013   Procedure: REMOVAL EXTERNAL FIXATION RIGHT LEG ;  Surgeon: Rozanna Box, MD;  Location: [facility]  OR;  Service: Orthopedics;  Laterality: Right;  . I&D EXTREMITY Right 10/09/2013   Procedure: IRRIGATION AND DEBRIDEMENT RIGHT LEG, CLOSURE  OF WOUNDS, PLACEMENT OF WOUND VAC ON EACH SIDE OF LEG;  Surgeon: Rozanna Box, MD;  Location: [facility];  Service: Orthopedics;  Laterality: Right;  . IVC filter  2009   placed @ [facility]/ Removed in 2010.  . IVC Filter Removed     . ligament leg Left   . ORIF TIBIA FRACTURE Right 12/29/2014   Procedure: OPEN REDUCTION INTERNAL FIXATION (ORIF) RIGHT TIBIA FRACTURE, RIA VS ICBG;  Surgeon: Altamese Bryn Athyn, MD;  Location: [place];  Service: Orthopedics;  Laterality: Right;    Family Psychiatric History:  Denied    Family History: No family history on file.  Social History:   Social History   Social History  . Marital status: Single    Spouse name: N/A  . Number of children: N/A  . Years of education: N/A   Social History Main Topics  . Smoking status: Current Some Day Smoker    Packs/day: 0.10    Years: 52.00  . Smokeless tobacco: None  . Alcohol use 8.4 oz/week    14 Cans of beer per week  . Drug use: No  . Sexual activity: Not Asked   Other Topics Concern  . None   Social History Narrative   ** Merged History Encounter **        Additional Social History:  Son lives with him. He is 13 yo. Self employed.  Married x 2.  Unemployed now.   Allergies:   Allergies  Allergen Reactions  . Imdur [Isosorbide Dinitrate] Other (See Comments)    hallucinations  . Singulair [Montelukast Sodium] Other (See Comments)    Hallucinations     Metabolic Disorder Labs: No results found for: HGBA1C, MPG No results found for: PROLACTIN No results found for: CHOL, TRIG, HDL, CHOLHDL, VLDL, LDLCALC   Current Medications: Current Outpatient Prescriptions  Medication Sig Dispense Refill  . atorvastatin (LIPITOR) 80 MG tablet Take 80 mg by mouth at bedtime.     . benazepril-hydrochlorthiazide (LOTENSIN HCT) 20-12.5 MG per tablet Take 1 tablet by mouth daily.    Marland Kitchen CALCIUM CITRATE PO Take 250 mg by mouth at bedtime.     . Cholecalciferol (VITAMIN D3 PO) Take 1,000 tablets by mouth daily.     . DOCOSAHEXAENOIC ACID PO Take by mouth.    . docusate sodium (COLACE) 100 MG capsule Take 100 mg by mouth 2 (two) times daily.    . fluticasone (FLONASE) 50 MCG/ACT nasal spray Place 1 spray into both nostrils daily.     . furosemide (LASIX) 20 MG tablet Take 20 mg by mouth daily.    Marland Kitchen HYDROcodone-acetaminophen (NORCO) 10-325 MG tablet Take 1-2 tablets by mouth every 6 (six) hours as needed for severe pain. 90 tablet 0  . isosorbide mononitrate (IMDUR) 60 MG 24 hr tablet Take 60 mg by mouth at bedtime.     Marland Kitchen loratadine (CLARITIN) 10 MG tablet Take 10 mg by mouth at bedtime.     . methocarbamol (ROBAXIN) 500 MG tablet Take 1-2 tablets (500-1,000 mg total) by mouth every 6 (six) hours as needed for muscle spasms. 60 tablet 0  . metoprolol succinate (TOPROL-XL) 50 MG 24 hr tablet Take 1 tablet (50 mg total) by mouth daily. (Patient taking differently: Take 50 mg by mouth at bedtime. ) 90 tablet 0  . montelukast (SINGULAIR) 10 MG tablet Take 10 mg by  mouth at bedtime.    . Multiple Vitamin (MULTIVITAMIN) tablet Take 1 tablet by mouth daily. For Men    . nitroGLYCERIN (NITROSTAT) 0.4 MG SL tablet Place 0.4 mg under the tongue every 5 (five) minutes as needed for chest pain.     . Omega-3 Fatty Acids (FISH OIL PO) Take 1 capsule by mouth daily.    Marland Kitchen omeprazole (PRILOSEC) 20 MG capsule Take 1 capsule (20 mg total) by mouth daily. 90 capsule 0  . rivaroxaban (XARELTO) 20 MG TABS tablet Take 20 mg by mouth daily with supper.    . traMADol (ULTRAM) 50 MG tablet Take 50 mg by mouth 2 (two) times daily.    Marland Kitchen ALPRAZolam (XANAX) 0.25 MG tablet Take 1 tablet (0.25 mg total) by mouth daily as needed for anxiety. 30 tablet 1  . traZODone (DESYREL) 50 MG tablet Take 1 tablet (50 mg total) by mouth at bedtime. 30 tablet 1   No current facility-administered medications for this visit.     Neurologic: Headache: No Seizure: No Paresthesias:No  Musculoskeletal: Strength & Muscle Tone: within normal limits Gait & Station: normal Patient leans: N/A  Psychiatric Specialty Exam: ROS  Blood pressure 101/65, pulse 89, temperature 97.3 F (36.3 C), temperature source Oral, height 6\' 1"  (1.854 m), weight 189 lb 12.8 oz (86.1  kg).Body mass index is 25.04 kg/m.  General Appearance: Casual  Eye Contact:  Fair  Speech:  Clear and Coherent  Volume:  Normal  Mood:  Anxious  Affect:  Negative  Thought Process:  Coherent  Orientation:  Full (Time, Place, and Person)  Thought Content:  WDL  Suicidal Thoughts:  No  Homicidal Thoughts:  No  Memory:  Immediate;   Fair Recent;   Fair Remote;   Fair  Judgement:  Fair  Insight:  Fair  Psychomotor Activity:  Normal  Concentration:  Concentration: Fair and Attention Span: Fair  Recall:  [employer] of Knowledge:Fair  Language: Fair  Akathisia:  No  Handed:  Right  AIMS (if indicated):    Assets:  Communication Skills Desire for Improvement Physical Health Social Support  ADL's:  Intact  Cognition: WNL  Sleep:      Treatment Plan Summary: Medication management    Discussed with patient about his medications treatment risks benefits and alternatives. I will start him on Xanax 0.25 mg by mouth daily when necessary to continue with his anxiety and he agreed with the plan. He will also be given trazodone 50 mg by mouth daily at bedtime advised patient to take half a pill if he is feeling too sedated and he agreed with the plan.   Discussed with him about side effects of medication and he demonstrated understanding. Follow-up in one month or earlier depending on his symptoms   More than 50% of the time spent in psychoeducation, counseling and coordination of care.    This note was generated in part or whole with voice recognition software. Voice regonition is usually quite accurate but there are transcription errors that can and very often do occur. I apologize for any typographical errors that were not detected and corrected.     Rainey Pines, MD 8/22/20179:33 AM

## 2015-11-26 ENCOUNTER — Encounter: Payer: Self-pay | Admitting: Psychiatry

## 2015-11-26 ENCOUNTER — Ambulatory Visit (INDEPENDENT_AMBULATORY_CARE_PROVIDER_SITE_OTHER): Payer: Medicare HMO | Admitting: Psychiatry

## 2015-11-26 VITALS — BP 125/81 | HR 77 | Temp 97.5°F | Ht 73.0 in | Wt 198.4 lb

## 2015-11-26 DIAGNOSIS — F331 Major depressive disorder, recurrent, moderate: Secondary | ICD-10-CM

## 2015-11-26 DIAGNOSIS — F411 Generalized anxiety disorder: Secondary | ICD-10-CM | POA: Diagnosis not present

## 2015-11-26 MED ORDER — TRAZODONE HCL 100 MG PO TABS: 100.0000 mg | ORAL_TABLET | Freq: Every day | ORAL | 1 refills | Status: DC

## 2015-11-26 NOTE — Progress Notes (Signed)
Psychiatric MD Progress Note   Patient Identification: Bruce [name] MRN:  [id number] Date of Evaluation:  11/26/2015 Referral Source: [facility]  Chief Complaint:   Chief Complaint    Follow-up; Medication Refill     Visit Diagnosis:    ICD-9-CM ICD-10-CM   1. MDD (major depressive disorder), recurrent episode, moderate (HCC) 296.32 F33.1   2. GAD (generalized anxiety disorder) 300.02 F41.1     History of Present Illness:   Pt  is a 70 year old male who was referred from the [place]. He reported that he was following [facility], [place] for at least 5 years but she suddenly decided to stop prescribing him Xanax which he has been taking on a when necessary basis. Patient reported that he currently lives by himself. He reported that he Was recently diagnosed with a clot in his leg about 3 weeks ago and now he is taking medications for the same. He has been following with his cardiologist on a regular basis. He reported that he is unable to sleep at night due to the pain in his legs. 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 Patient reported that he feels restless at night. The cardiologists have advised him to take it slow and to keep his leg elevated. However he feels anxious during the daytime. He has been trying to complete his chores on a daily basis. Patient reported that he does not take trazodone on a regular basis but has increased the dose 200 mg as it does not help. He is not taking Xanax on a regular basis. He feels that the medications are helping him. He denied having any anxiety or mood symptoms at this time.   He appeared pleasant and cooperative during the interview. He reported that he currently lives with his son. He spends time around the house doing small stuff.He currently denied having any perceptual disturbances. He denied having any suicidal ideations or plans.  Associated Signs/Symptoms: Depression Symptoms:  depressed  mood, insomnia, fatigue, difficulty concentrating, anxiety, loss of energy/fatigue, disturbed sleep, (Hypo) Manic Symptoms:  Impulsivity, Irritable Mood, Anxiety Symptoms:  Excessive Worry, Psychotic Symptoms:  denied PTSD Symptoms: Negative NA  Past Psychiatric History:  Patient has history of hallucinations from Singulair which was stopped. He has never been admitted to a psychiatric hospital. He denied any history of suicide attempts.  Previous Psychotropic Medications: Xanax   Substance Abuse History in the last 12 months:  No.  Consequences of Substance Abuse: Negative NA  Past Medical History:  Past Medical History:  Diagnosis Date  . Anginal pain ([place])    Left side if chest ,NTG  relieves chaes apin 12/01/13  . Arthritis   . 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 COPD (chronic obstructive pulmonary disease) ([place])   . Coronary artery disease   . Diabetes mellitus without complication ([place])    does not take medication or test blood sugar  . History of blood transfusion   . Hypertension   . Myocardial infarction [facility])    '09 AND '12  . Peripheral vascular disease ([place])   . Shortness of breath    With exertion .  Marland Kitchen UTI (urinary tract infection)    frequent UTI    Past Surgical History:  Procedure Laterality Date  . CARDIAC CATHETERIZATION    . CHOLECYSTECTOMY    . EXTERNAL FIXATION LEG Right 10/06/2013   Procedure: CLOSED REDUCTION RIGHT TIBIAL PLATEAU FRACTURE, EXTERNAL  FIXATION RIGHT LEG, PLACEMENT OF WOUND VAC;  Surgeon: Rozanna Box, MD;  Location: [facility];  Service: Orthopedics;  Laterality: Right;  . FEMORAL-POPLITEAL BYPASS GRAFT Right 10/06/2013   Procedure: RIGHT POPLITEAL-POPLITEAL ARTERY BYPASS GRAFT;  Surgeon: Rosetta Posner, MD;  Location: [facility];  Service: Vascular;  Laterality: Right;  . HARDWARE REMOVAL Right 12/02/2013   Procedure: REMOVAL EXTERNAL FIXATION RIGHT LEG ;  Surgeon: Rozanna Box, MD;   Location: [facility];  Service: Orthopedics;  Laterality: Right;  . I&D EXTREMITY Right 10/09/2013   Procedure: IRRIGATION AND DEBRIDEMENT RIGHT LEG, CLOSURE  OF WOUNDS, PLACEMENT OF WOUND VAC ON EACH SIDE OF LEG;  Surgeon: Rozanna Box, MD;  Location: [place];  Service: Orthopedics;  Laterality: Right;  . IVC filter  2009   placed @ [facility]/ Removed in 2010.  . IVC Filter Removed    . ligament leg Left   . ORIF TIBIA FRACTURE Right 12/29/2014   Procedure: OPEN REDUCTION INTERNAL FIXATION (ORIF) RIGHT TIBIA FRACTURE, RIA VS ICBG;  Surgeon: Altamese [place], MD;  Location: [place];  Service: Orthopedics;  Laterality: Right;    Family Psychiatric History:  Denied    Family History: History reviewed. No pertinent family history.  Social History:   Social History   Social History  . Marital status: Single    Spouse name: N/A  . Number of children: N/A  . Years of education: N/A   Social History Main Topics  . Smoking status: Current Some Day Smoker    Packs/day: 0.10    Years: 52.00  . Smokeless tobacco: Never Used  . Alcohol use 8.4 oz/week    14 Cans of beer per week  . Drug use: No  . Sexual activity: Not Currently   Other Topics Concern  . None   Social History Narrative   ** Merged History Encounter **        Additional Social History:  Son lives with him. He is 20 yo. Self employed.  Married x 2.  Unemployed now.   Allergies:   Allergies  Allergen Reactions  . Imdur [Isosorbide Dinitrate] Other (See Comments)    hallucinations  . Singulair [Montelukast Sodium] Other (See Comments)    Hallucinations     Metabolic Disorder Labs: No results found for: HGBA1C, MPG No results found for: PROLACTIN No results found for: CHOL, TRIG, HDL, CHOLHDL, VLDL, LDLCALC   Current Medications: Current Outpatient Prescriptions  Medication Sig Dispense Refill  . ALPRAZolam (XANAX) 0.25 MG tablet Take 1 tablet (0.25 mg total) by mouth daily as needed for anxiety. 30 tablet 1  .  atorvastatin (LIPITOR) 80 MG tablet Take 80 mg by mouth at bedtime.     . benazepril-hydrochlorthiazide (LOTENSIN HCT) 20-12.5 MG per tablet Take 1 tablet by mouth daily.    Marland Kitchen CALCIUM CITRATE PO Take 250 mg by mouth at bedtime.     . Cholecalciferol (VITAMIN D3 PO) Take 1,000 tablets by mouth daily.     . DOCOSAHEXAENOIC ACID PO Take by mouth.    . fluticasone (FLONASE) 50 MCG/ACT nasal spray Place 1 spray into both nostrils daily.    . furosemide (LASIX) 20 MG tablet Take 20 mg by mouth daily.    Marland Kitchen HYDROcodone-acetaminophen (NORCO) 10-325 MG tablet Take 1-2 tablets by mouth every 6 (six) hours as needed for severe pain. 90 tablet 0  . loratadine (CLARITIN) 10 MG tablet Take 10 mg by mouth at bedtime.     . methocarbamol (ROBAXIN) 500 MG tablet  Take 1-2 tablets (500-1,000 mg total) by mouth every 6 (six) hours as needed for muscle spasms. 60 tablet 0  . metoprolol succinate (TOPROL-XL) 50 MG 24 hr tablet Take 1 tablet (50 mg total) by mouth daily. (Patient taking differently: Take 50 mg by mouth at bedtime. ) 90 tablet 0  . montelukast (SINGULAIR) 10 MG tablet Take 10 mg by mouth at bedtime.    . Multiple Vitamin (MULTIVITAMIN) tablet Take 1 tablet by mouth daily. For Men    . nitroGLYCERIN (NITROSTAT) 0.4 MG SL tablet Place 0.4 mg under the tongue every 5 (five) minutes as needed for chest pain.     . Omega-3 Fatty Acids (FISH OIL PO) Take 1 capsule by mouth daily.    Marland Kitchen omeprazole (PRILOSEC) 20 MG capsule Take 1 capsule (20 mg total) by mouth daily. 90 capsule 0  . rivaroxaban (XARELTO) 20 MG TABS tablet Take 20 mg by mouth daily with supper.    . traMADol (ULTRAM) 50 MG tablet Take 50 mg by mouth 2 (two) times daily.    . traZODone (DESYREL) 100 MG tablet Take 1 tablet (100 mg total) by mouth at bedtime. 30 tablet 1   No current facility-administered medications for this visit.     Neurologic: Headache: No Seizure: No Paresthesias:No  Musculoskeletal: Strength & Muscle Tone: within  normal limits Gait & Station: normal Patient leans: N/A  Psychiatric Specialty Exam: ROS  Blood pressure 125/81, pulse 77, temperature 97.5 F (36.4 C), temperature source Oral, height 6\' 1"  (1.854 m), weight 198 lb 6.4 oz (90 kg).Body mass index is 26.18 kg/m.  General Appearance: Casual  Eye Contact:  Fair  Speech:  Clear and Coherent  Volume:  Normal  Mood:  Anxious  Affect:  Negative  Thought Process:  Coherent  Orientation:  Full (Time, Place, and Person)  Thought Content:  WDL  Suicidal Thoughts:  No  Homicidal Thoughts:  No  Memory:  Immediate;   Fair Recent;   Fair Remote;   Fair  Judgement:  Fair  Insight:  Fair  Psychomotor Activity:  Normal  Concentration:  Concentration: Fair and Attention Span: Fair  Recall:  [employer] of Knowledge:Fair  Language: Fair  Akathisia:  No  Handed:  Right  AIMS (if indicated):    Assets:  Communication Skills Desire for Improvement Physical Health Social Support  ADL's:  Intact  Cognition: WNL  Sleep:      Treatment Plan Summary: Medication management    Discussed with patient about his medications treatment risks benefits and alternatives. Continue  Xanax 0.25 mg by mouth daily when necessary to continue with his anxiety and he agreed with the plan. He will also be given trazodone 100 mg by mouth daily at bedtime.  Discussed with him about side effects of medication and he demonstrated understanding. Follow-up in one month or earlier depending on his symptoms   More than 50% of the time spent in psychoeducation, counseling and coordination of care.    This note was generated in part or whole with voice recognition software. Voice regonition is usually quite accurate but there are transcription errors that can and very often do occur. I apologize for any typographical errors that were not detected and corrected.     Rainey Pines, MD 9/22/20179:24 AM

## 2015-12-24 ENCOUNTER — Ambulatory Visit: Payer: Medicare HMO | Admitting: Psychiatry

## 2015-12-29 ENCOUNTER — Other Ambulatory Visit: Payer: Self-pay | Admitting: Psychiatry

## 2016-01-12 ENCOUNTER — Telehealth: Payer: Self-pay

## 2016-01-12 DIAGNOSIS — F411 Generalized anxiety disorder: Secondary | ICD-10-CM

## 2016-01-12 NOTE — Telephone Encounter (Signed)
Medication refill - Fax received wit Alprazolam refill request, last written 10/26/15 + 1 refill.  Patient last evaluated 11/26/15 and was continued.  Patient no showed for eval 12/24/15 and no current appt. scheduled.

## 2016-01-13 MED ORDER — ALPRAZOLAM 0.25 MG PO TABS: 0.2500 mg | ORAL_TABLET | Freq: Every day | ORAL | 0 refills | Status: DC | PRN

## 2016-01-13 NOTE — Addendum Note (Signed)
Addended by: Watt Climes on: 01/13/2016 03:17 PM   Modules accepted: Orders

## 2016-01-13 NOTE — Telephone Encounter (Signed)
Called in new Alprazolam order for 7 days, 0.25 mg, one a day as needed with no refills to [facility] with Lovena Le, pharmacist as authorized by Dr. Gretel Acre with notation evaluation required for further refills.  Patient to keep new appointment on 01/17/16 for further refills.

## 2016-01-13 NOTE — Telephone Encounter (Signed)
Medication management - Telephone call with patient to assist him with scheduling a new appointment for 01/17/16 at 2:45pm with Dr. Gretel Acre and agreed to call in 1 week of Alprazolam to his pharmacy per Dr. Gretel Acre approval.

## 2016-01-13 NOTE — Telephone Encounter (Signed)
Please make appt. Dispense 7 days supply only and advise pt that no more refills .

## 2016-01-17 ENCOUNTER — Ambulatory Visit (INDEPENDENT_AMBULATORY_CARE_PROVIDER_SITE_OTHER): Payer: Medicare HMO | Admitting: Psychiatry

## 2016-01-17 ENCOUNTER — Encounter: Payer: Self-pay | Admitting: Psychiatry

## 2016-01-17 VITALS — BP 125/72 | HR 76 | Ht 73.0 in | Wt 199.2 lb

## 2016-01-17 DIAGNOSIS — F331 Major depressive disorder, recurrent, moderate: Secondary | ICD-10-CM | POA: Diagnosis not present

## 2016-01-17 DIAGNOSIS — F411 Generalized anxiety disorder: Secondary | ICD-10-CM | POA: Diagnosis not present

## 2016-01-17 MED ORDER — ALPRAZOLAM 0.25 MG PO TABS: 0.2500 mg | ORAL_TABLET | Freq: Every day | ORAL | 2 refills | Status: DC | PRN

## 2016-01-17 MED ORDER — TRAZODONE HCL 100 MG PO TABS: 100.0000 mg | ORAL_TABLET | Freq: Every day | ORAL | 2 refills | Status: DC

## 2016-01-17 NOTE — Progress Notes (Signed)
Psychiatric MD Progress Note   Patient Identification: Bruce [name] MRN:  [id number] Date of Evaluation:  01/17/2016 Referral Source: [facility]  Chief Complaint:   Chief Complaint    Follow-up     Visit Diagnosis:    ICD-9-CM ICD-10-CM   1. MDD (major depressive disorder), recurrent episode, moderate (HCC) 296.32 F33.1   2. Generalized anxiety disorder 300.02 F41.1 ALPRAZolam (XANAX) 0.25 MG tablet     DISCONTINUED: ALPRAZolam (XANAX) 0.25 MG tablet    History of Present Illness:   Pt  is a 70 year old male who was referred from the [place]. Patient was seen for follow-up appointment. He reported that he has been doing well and has been taking his medications. He takes Xanax and trazodone on a regular basis. Patient reported that he has not been giving his blood in there by his primary care physician and is going to stop by today to find out the reason for the same. He spent the weekend hunting and his friends. He appeared pleasantly cooperative. No acute issues noted at this time. He has been sleeping well with the help of his medications. Patient reported that he has been compliant with his medications. He currently denied having any worsening of his symptoms. He reported that he continues to have jerking of  one of his legs which was injured in the accident many years ago but he has not noticed any improvement. He received some information from his drug company which keeps calling him and talking about his medications. He was sharing the same information. He talked about diabetic neuropathy and restless syndrome. Patient reported that he is not diabetic. We discussed about the medical information. I advised patient to share this information with his  medical doctor and he agreed with the plan.  He is planning to go to Dr. Humphrey Rolls  office today to inquire about his medications.. Patient appeared pleasantly cooperative. He currently denied having any suicidal homicidal ideations  or plans.He spends time around the house doing small stuff.   Associated Signs/Symptoms: Depression Symptoms:  fatigue, anxiety, loss of energy/fatigue, disturbed sleep, (Hypo) Manic Symptoms:  Impulsivity, Irritable Mood, Anxiety Symptoms:  Excessive Worry, Psychotic Symptoms:  denied PTSD Symptoms: Negative NA  Past Psychiatric History:  Patient has history of hallucinations from Singulair which was stopped. He has never been admitted to a psychiatric hospital. He denied any history of suicide attempts.  Previous Psychotropic Medications: Xanax   Substance Abuse History in the last 12 months:  No.  Consequences of Substance Abuse: Negative NA  Past Medical History:  Past Medical History:  Diagnosis Date  . Anginal pain ([place])    Left side if chest ,NTG  relieves chaes apin 12/01/13  . Arthritis   . 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 COPD (chronic obstructive pulmonary disease) ([place])   . Coronary artery disease   . Diabetes mellitus without complication ([place])    does not take medication or test blood sugar  . History of blood transfusion   . Hypertension   . Myocardial infarction    '09 AND '12  . Peripheral vascular disease ([place])   . Shortness of breath    With exertion .  Marland Kitchen UTI (urinary tract infection)    frequent UTI    Past Surgical History:  Procedure Laterality Date  . CARDIAC CATHETERIZATION    . CHOLECYSTECTOMY    . EXTERNAL FIXATION LEG Right 10/06/2013   Procedure: CLOSED REDUCTION RIGHT TIBIAL PLATEAU  FRACTURE, EXTERNAL FIXATION RIGHT LEG, PLACEMENT OF WOUND VAC;  Surgeon: Rozanna Box, MD;  Location: [facility];  Service: Orthopedics;  Laterality: Right;  . FEMORAL-POPLITEAL BYPASS GRAFT Right 10/06/2013   Procedure: RIGHT POPLITEAL-POPLITEAL ARTERY BYPASS GRAFT;  Surgeon: Rosetta Posner, MD;  Location: [facility];  Service: Vascular;  Laterality: Right;  . HARDWARE REMOVAL Right 12/02/2013   Procedure:  REMOVAL EXTERNAL FIXATION RIGHT LEG ;  Surgeon: Rozanna Box, MD;  Location: [facility];  Service: Orthopedics;  Laterality: Right;  . I&D EXTREMITY Right 10/09/2013   Procedure: IRRIGATION AND DEBRIDEMENT RIGHT LEG, CLOSURE  OF WOUNDS, PLACEMENT OF WOUND VAC ON EACH SIDE OF LEG;  Surgeon: Rozanna Box, MD;  Location: [facility];  Service: Orthopedics;  Laterality: Right;  . IVC filter  2009   placed @ [facility]/ Removed in 2010.  . IVC Filter Removed    . ligament leg Left   . ORIF TIBIA FRACTURE Right 12/29/2014   Procedure: OPEN REDUCTION INTERNAL FIXATION (ORIF) RIGHT TIBIA FRACTURE, RIA VS ICBG;  Surgeon: Altamese [place], MD;  Location: [facility];  Service: Orthopedics;  Laterality: Right;    Family Psychiatric History:  Denied    Family History: No family history on file.  Social History:   Social History   Social History  . Marital status: Single    Spouse name: N/A  . Number of children: N/A  . Years of education: N/A   Social History Main Topics  . Smoking status: Current Some Day Smoker    Packs/day: 0.50    Years: 52.00  . Smokeless tobacco: Never Used  . Alcohol use No     Comment: Stopped   . Drug use: No  . Sexual activity: Not Currently   Other Topics Concern  . None   Social History Narrative   ** Merged History Encounter **        Additional Social History:  Son lives with him. He is 1 yo. Self employed.  Married x 2.  Unemployed now.   Allergies:   Allergies  Allergen Reactions  . Imdur [Isosorbide Dinitrate] Other (See Comments)    hallucinations  . Singulair [Montelukast Sodium] Other (See Comments)    Hallucinations     Metabolic Disorder Labs: No results found for: HGBA1C, MPG No results found for: PROLACTIN No results found for: CHOL, TRIG, HDL, CHOLHDL, VLDL, LDLCALC   Current Medications: Current Outpatient Prescriptions  Medication Sig Dispense Refill  . ALPRAZolam (XANAX) 0.25 MG tablet Take 1 tablet (0.25 mg total) by mouth daily as  needed for anxiety. 30 tablet 2  . atorvastatin (LIPITOR) 80 MG tablet Take 80 mg by mouth at bedtime.     . benazepril-hydrochlorthiazide (LOTENSIN HCT) 20-12.5 MG per tablet Take 1 tablet by mouth daily.    Marland Kitchen CALCIUM CITRATE PO Take 250 mg by mouth at bedtime.     . Cholecalciferol (VITAMIN D3 PO) Take 1,000 tablets by mouth daily.     . DOCOSAHEXAENOIC ACID PO Take by mouth.    . fluticasone (FLONASE) 50 MCG/ACT nasal spray Place 1 spray into both nostrils daily.    . furosemide (LASIX) 20 MG tablet Take 20 mg by mouth daily.    Marland Kitchen HYDROcodone-acetaminophen (NORCO) 10-325 MG tablet Take 1-2 tablets by mouth every 6 (six) hours as needed for severe pain. 90 tablet 0  . loratadine (CLARITIN) 10 MG tablet Take 10 mg by mouth at bedtime.     . methocarbamol (ROBAXIN) 500 MG tablet Take 1-2  tablets (500-1,000 mg total) by mouth every 6 (six) hours as needed for muscle spasms. 60 tablet 0  . metoprolol succinate (TOPROL-XL) 50 MG 24 hr tablet Take 1 tablet (50 mg total) by mouth daily. (Patient taking differently: Take 50 mg by mouth at bedtime. ) 90 tablet 0  . montelukast (SINGULAIR) 10 MG tablet Take 10 mg by mouth at bedtime.    . Multiple Vitamin (MULTIVITAMIN) tablet Take 1 tablet by mouth daily. For Men    . nitroGLYCERIN (NITROSTAT) 0.4 MG SL tablet Place 0.4 mg under the tongue every 5 (five) minutes as needed for chest pain.     . Omega-3 Fatty Acids (FISH OIL PO) Take 1 capsule by mouth daily.    Marland Kitchen omeprazole (PRILOSEC) 20 MG capsule Take 1 capsule (20 mg total) by mouth daily. 90 capsule 0  . rivaroxaban (XARELTO) 20 MG TABS tablet Take 20 mg by mouth daily with supper.    . traMADol (ULTRAM) 50 MG tablet Take 50 mg by mouth 2 (two) times daily.    . traZODone (DESYREL) 100 MG tablet Take 1 tablet (100 mg total) by mouth at bedtime. 30 tablet 2   No current facility-administered medications for this visit.     Neurologic: Headache: No Seizure:  No Paresthesias:No  Musculoskeletal: Strength & Muscle Tone: within normal limits Gait & Station: normal Patient leans: N/A  Psychiatric Specialty Exam: ROS  Blood pressure 125/72, pulse 76, height 6\' 1"  (1.854 m), weight 199 lb 3.2 oz (90.4 kg).Body mass index is 26.28 kg/m.  General Appearance: Casual  Eye Contact:  Fair  Speech:  Clear and Coherent  Volume:  Normal  Mood:  Anxious  Affect:  Negative  Thought Process:  Coherent  Orientation:  Full (Time, Place, and Person)  Thought Content:  WDL  Suicidal Thoughts:  No  Homicidal Thoughts:  No  Memory:  Immediate;   Fair Recent;   Fair Remote;   Fair  Judgement:  Fair  Insight:  Fair  Psychomotor Activity:  Normal  Concentration:  Concentration: Fair and Attention Span: Fair  Recall:  [employer] of Knowledge:Fair  Language: Fair  Akathisia:  No  Handed:  Right  AIMS (if indicated):    Assets:  Communication Skills Desire for Improvement Physical Health Social Support  ADL's:  Intact  Cognition: WNL  Sleep:      Treatment Plan Summary: Medication management    Discussed with patient about his medications treatment risks benefits and alternatives. Continue  Xanax 0.25 mg by mouth daily when necessary to continue with his anxiety and he agreed with the plan. He will also be given trazodone 100 mg by mouth daily at bedtime.  Discussed with him about side effects of medication and he demonstrated understanding. Follow-up in 3  month or earlier depending on his symptoms   More than 50% of the time spent in psychoeducation, counseling and coordination of care.   Advised patient that I will be leaving this office in the end of this month and he demonstrated understanding.   This note was generated in part or whole with voice recognition software. Voice regonition is usually quite accurate but there are transcription errors that can and very often do occur. I apologize for any typographical errors that were not  detected and corrected.     Rainey Pines, MD 11/13/20172:59 PM

## 2016-02-09 ENCOUNTER — Telehealth: Payer: Self-pay

## 2016-02-09 NOTE — Telephone Encounter (Signed)
pt called left message to return call no info was left but his name and phone number.

## 2016-02-09 NOTE — Telephone Encounter (Signed)
left message to please call our office back

## 2016-03-14 ENCOUNTER — Encounter: Payer: Self-pay | Admitting: Urology

## 2016-03-14 ENCOUNTER — Ambulatory Visit (INDEPENDENT_AMBULATORY_CARE_PROVIDER_SITE_OTHER): Payer: Medicare HMO | Admitting: Urology

## 2016-03-14 VITALS — BP 138/83 | HR 75 | Ht 73.0 in | Wt 212.5 lb

## 2016-03-14 DIAGNOSIS — R3129 Other microscopic hematuria: Secondary | ICD-10-CM | POA: Diagnosis not present

## 2016-03-14 DIAGNOSIS — R972 Elevated prostate specific antigen [PSA]: Secondary | ICD-10-CM

## 2016-03-14 LAB — URINALYSIS, COMPLETE
Bilirubin, UA: NEGATIVE
GLUCOSE, UA: NEGATIVE
KETONES UA: NEGATIVE
Leukocytes, UA: NEGATIVE
NITRITE UA: NEGATIVE
Protein, UA: NEGATIVE
Specific Gravity, UA: 1.01 (ref 1.005–1.030)
UUROB: 0.2 mg/dL (ref 0.2–1.0)
pH, UA: 6 (ref 5.0–7.5)

## 2016-03-14 LAB — MICROSCOPIC EXAMINATION
Bacteria, UA: NONE SEEN
WBC UA: NONE SEEN /HPF (ref 0–?)

## 2016-03-14 NOTE — Progress Notes (Signed)
03/14/2016 10:55 AM   Bruce [name] [birth year] [id number]  Referring provider: Kasandra Knudsen, NP [place], [place] [address]  Chief Complaint  Patient presents with  . New Patient (Initial Visit)    elevated PSA     HPI: The patient was referred for an elevated PSA. The patient does not have any prior PSAs available, but one obtained on 02/20/16 was 13 point 5. The patient relates that here recalls a PSA being taken early in January 2017 and it was normal. He's never had an abnormal rectal exam. He has no history of prostate cancer within his family. He denies any progressive voiding symptoms including frequency, urgency, or dysuria over the last 6 months. Interestingly, the patient states that he had some gross hematuria about 18 months ago that was never worked up. Today, the patient has microscopic hematuria (greater than 30 red blood cells/HPF).  The patient has a family history of colon cancer. He has a history of heart attack.     PMH: Past Medical History:  Diagnosis Date  . Anginal pain ([place])    Left side if chest ,NTG  relieves chaes apin 12/01/13  . Arthritis   . 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 COPD (chronic obstructive pulmonary disease) ([place])   . Coronary artery disease   . Diabetes mellitus without complication ([place])    does not take medication or test blood sugar  . History of blood transfusion   . Hypertension   . Myocardial infarction    '09 AND '12  . Peripheral vascular disease ([place])   . Shortness of breath    With exertion .  Marland Kitchen UTI (urinary tract infection)    frequent UTI    Surgical History: Past Surgical History:  Procedure Laterality Date  . CARDIAC CATHETERIZATION    . CHOLECYSTECTOMY    . EXTERNAL FIXATION LEG Right 10/06/2013   Procedure: CLOSED REDUCTION RIGHT TIBIAL PLATEAU FRACTURE, EXTERNAL FIXATION RIGHT LEG, PLACEMENT OF WOUND VAC;  Surgeon: Rozanna Box, MD;   Location: [place];  Service: Orthopedics;  Laterality: Right;  . FEMORAL-POPLITEAL BYPASS GRAFT Right 10/06/2013   Procedure: RIGHT POPLITEAL-POPLITEAL ARTERY BYPASS GRAFT;  Surgeon: Rosetta Posner, MD;  Location: [place];  Service: Vascular;  Laterality: Right;  . HARDWARE REMOVAL Right 12/02/2013   Procedure: REMOVAL EXTERNAL FIXATION RIGHT LEG ;  Surgeon: Rozanna Box, MD;  Location: [place];  Service: Orthopedics;  Laterality: Right;  . I&D EXTREMITY Right 10/09/2013   Procedure: IRRIGATION AND DEBRIDEMENT RIGHT LEG, CLOSURE  OF WOUNDS, PLACEMENT OF WOUND VAC ON EACH SIDE OF LEG;  Surgeon: Rozanna Box, MD;  Location: [place];  Service: Orthopedics;  Laterality: Right;  . IVC filter  2009   placed @ [facility]/ Removed in 2010.  . IVC Filter Removed    . ligament leg Left   . ORIF TIBIA FRACTURE Right 12/29/2014   Procedure: OPEN REDUCTION INTERNAL FIXATION (ORIF) RIGHT TIBIA FRACTURE, RIA VS ICBG;  Surgeon: Altamese Amherst, MD;  Location: [place];  Service: Orthopedics;  Laterality: Right;    Home Medications:  Allergies as of 03/14/2016      Reactions   Imdur [isosorbide Dinitrate] Other (See Comments)   hallucinations   Singulair [montelukast Sodium] Other (See Comments)   Hallucinations      Medication List       Accurate as of 03/14/16 10:55 AM. Always use your most recent med list.  ALPRAZolam 0.25 MG tablet Commonly known as:  XANAX Take 1 tablet (0.25 mg total) by mouth daily as needed for anxiety.   atorvastatin 80 MG tablet Commonly known as:  LIPITOR Take 80 mg by mouth at bedtime.   benazepril-hydrochlorthiazide 20-12.5 MG tablet Commonly known as:  LOTENSIN HCT Take 1 tablet by mouth daily.   CALCIUM CITRATE PO Take 250 mg by mouth at bedtime.   DOCOSAHEXAENOIC ACID PO Take by mouth.   FISH OIL PO Take 1 capsule by mouth daily.   fluticasone 50 MCG/ACT nasal spray Commonly known as:  FLONASE Place 1 spray into both nostrils daily.   furosemide 20 MG  tablet Commonly known as:  LASIX Take 20 mg by mouth daily.   loratadine 10 MG tablet Commonly known as:  CLARITIN Take 10 mg by mouth at bedtime.   methocarbamol 500 MG tablet Commonly known as:  ROBAXIN Take 1-2 tablets (500-1,000 mg total) by mouth every 6 (six) hours as needed for muscle spasms.   metoprolol succinate 50 MG 24 hr tablet Commonly known as:  TOPROL-XL Take 1 tablet (50 mg total) by mouth daily.   montelukast 10 MG tablet Commonly known as:  SINGULAIR Take 10 mg by mouth at bedtime.   multivitamin tablet Take 1 tablet by mouth daily. For Men   nitroGLYCERIN 0.4 MG SL tablet Commonly known as:  NITROSTAT Place 0.4 mg under the tongue every 5 (five) minutes as needed for chest pain.   omeprazole 20 MG capsule Commonly known as:  PRILOSEC Take 1 capsule (20 mg total) by mouth daily.   rivaroxaban 20 MG Tabs tablet Commonly known as:  XARELTO Take 20 mg by mouth daily with supper.   traMADol 50 MG tablet Commonly known as:  ULTRAM Take 50 mg by mouth 2 (two) times daily.   traZODone 100 MG tablet Commonly known as:  DESYREL Take 1 tablet (100 mg total) by mouth at bedtime.   VITAMIN D3 PO Take 1,000 tablets by mouth daily.       Allergies:  Allergies  Allergen Reactions  . Imdur [Isosorbide Dinitrate] Other (See Comments)    hallucinations  . Singulair [Montelukast Sodium] Other (See Comments)    Hallucinations     Family History: Family History  Problem Relation Age of Onset  . Prostate cancer Brother     Social History:  reports that he has been smoking.  He has a 26.00 pack-year smoking history. He has never used smokeless tobacco. He reports that he does not drink alcohol or use drugs.  ROS: UROLOGY Frequent Urination?: No Hard to postpone urination?: No Burning/pain with urination?: No Get up at night to urinate?: No Leakage of urine?: No Urine stream starts and stops?: No Trouble starting stream?: No Do you have to strain  to urinate?: No Blood in urine?: Yes Urinary tract infection?: No Sexually transmitted disease?: No Injury to kidneys or bladder?: No Painful intercourse?: No Weak stream?: No Erection problems?: No Penile pain?: No  Gastrointestinal Nausea?: Yes Vomiting?: No Indigestion/heartburn?: Yes Diarrhea?: No Constipation?: No  Constitutional Fever: No Night sweats?: No Weight loss?: No Fatigue?: Yes  Skin Skin rash/lesions?: No Itching?: No  Eyes Blurred vision?: No Double vision?: No  Ears/Nose/Throat Sore throat?: No Sinus problems?: Yes  Hematologic/Lymphatic Swollen glands?: No Easy bruising?: Yes  Cardiovascular Leg swelling?: Yes Chest pain?: No  Respiratory Cough?: Yes Shortness of breath?: Yes  Endocrine Excessive thirst?: No  Musculoskeletal Back pain?: No Joint pain?: Yes  Neurological Headaches?: Yes  Dizziness?: Yes  Psychologic Depression?: Yes Anxiety?: Yes  Physical Exam: BP 138/83   Pulse 75   Ht 6\' 1"  (1.854 m)   Wt 96.4 kg (212 lb 8 oz)   BMI 28.04 kg/m   Constitutional:  Alert and oriented, No acute distress. HEENT: [place] AT, moist mucus membranes.  Trachea midline, no masses. Cardiovascular: No clubbing, cyanosis, or edema. Respiratory: Normal respiratory effort, no increased work of breathing. GI: Abdomen is soft, nontender, nondistended, no abdominal masses GU: No CVA tenderness.  DRE: Plus 2 prostate, smooth and symmetric. Skin: No rashes, bruises or suspicious lesions. Lymph: No cervical or inguinal adenopathy. Neurologic: Grossly intact, no focal deficits, moving all 4 extremities. Psychiatric: Normal mood and affect.  Laboratory Data: Lab Results  Component Value Date   WBC 11.2 (H) 03/09/2015   HGB 13.2 03/09/2015   HCT 39.4 (L) 03/09/2015   MCV 92.1 03/09/2015   PLT 243 03/09/2015    Lab Results  Component Value Date   CREATININE 0.97 03/09/2015    No results found for: PSA  No results found for:  TESTOSTERONE  No results found for: HGBA1C  Urinalysis No results found for: COLORURINE, APPEARANCEUR, LABSPEC, [place], GLUCOSEU, HGBUR, BILIRUBINUR, KETONESUR, PROTEINUR, UROBILINOGEN, NITRITE, LEUKOCYTESUR  Pertinent Imaging: I reviewed the patient's urinalysis demonstrates microscopic hematuria without evidence of infection.  Assessment & Plan:  The patient has an elevated PSA of 13 point 5 without any previous PSAs available. His rectal exam is normal. He does have microscopic hematuria although he has not had any progressive voiding symptoms. He has a remote history of gross hematuria which has never been fully evaluated.  1. Elevated PSA I discussed the implications of an elevated PSA with the patient. I think that we should first repeat the patient's PSA prior to making any further decisions. I recommended that we repeat this proximally 1 month from the previous PSA which was obtained in the middle of the December. Further, I think the patient's microscopic hematuria should be evaluated. The patient will return in 2 weeks with a PSA prior, we will plan to perform cystoscopy at that time to evaluate his bladder. If cystoscopy is unremarkable, we will likely proceed with CT scan. - Urinalysis, Complete - PSA, total and free  2. Microscopic hematuria  - Cystoscopy (Bedside); Future   Return in about 2 weeks (around 03/28/2016).  Ardis Hughs, [facility] [address], [place], [place] [address] [phone]

## 2016-03-14 NOTE — Addendum Note (Signed)
Addended by: Wilson Singer on: 03/14/2016 11:14 AM   Modules accepted: Orders

## 2016-03-16 LAB — CULTURE, URINE COMPREHENSIVE

## 2016-04-05 ENCOUNTER — Other Ambulatory Visit: Payer: Self-pay

## 2016-04-05 DIAGNOSIS — R972 Elevated prostate specific antigen [PSA]: Secondary | ICD-10-CM

## 2016-04-06 ENCOUNTER — Other Ambulatory Visit: Payer: Medicare HMO

## 2016-04-06 DIAGNOSIS — R972 Elevated prostate specific antigen [PSA]: Secondary | ICD-10-CM

## 2016-04-07 LAB — PSA: PROSTATE SPECIFIC AG, SERUM: 18.5 ng/mL — AB (ref 0.0–4.0)

## 2016-04-10 ENCOUNTER — Encounter: Payer: Self-pay | Admitting: Urology

## 2016-04-10 ENCOUNTER — Ambulatory Visit (INDEPENDENT_AMBULATORY_CARE_PROVIDER_SITE_OTHER): Payer: Medicare HMO | Admitting: Urology

## 2016-04-10 VITALS — BP 125/84 | HR 82 | Ht 73.0 in | Wt 220.1 lb

## 2016-04-10 DIAGNOSIS — R3129 Other microscopic hematuria: Secondary | ICD-10-CM | POA: Diagnosis not present

## 2016-04-10 DIAGNOSIS — R972 Elevated prostate specific antigen [PSA]: Secondary | ICD-10-CM

## 2016-04-10 LAB — URINALYSIS, COMPLETE
Bilirubin, UA: NEGATIVE
GLUCOSE, UA: NEGATIVE
KETONES UA: NEGATIVE
LEUKOCYTES UA: NEGATIVE
Nitrite, UA: NEGATIVE
RBC, UA: NEGATIVE
SPEC GRAV UA: 1.015 (ref 1.005–1.030)
Urobilinogen, Ur: 0.2 mg/dL (ref 0.2–1.0)
pH, UA: 6 (ref 5.0–7.5)

## 2016-04-10 LAB — MICROSCOPIC EXAMINATION

## 2016-04-10 MED ORDER — CIPROFLOXACIN HCL 500 MG PO TABS
500.0000 mg | ORAL_TABLET | Freq: Once | ORAL | Status: AC
  Administered 2016-04-10: 500 mg via ORAL

## 2016-04-10 MED ORDER — LIDOCAINE HCL 2 % EX GEL
1.0000 "application " | Freq: Once | CUTANEOUS | Status: AC
  Administered 2016-04-10: 1 via URETHRAL

## 2016-04-10 NOTE — Progress Notes (Signed)
   04/10/16  CC:  Chief Complaint  Patient presents with  . Cysto    microscopic hematuria    HPI:  Blood pressure 125/84, pulse 82, height 6\' 1"  (1.854 m), weight 99.8 kg (220 lb 1.6 oz). NED. A&Ox3.   No respiratory distress   Abd soft, NT, ND Normal phallus with bilateral descended testicles  Cystoscopy Procedure Note  Patient identification was confirmed, informed consent was obtained, and patient was prepped using Betadine solution.  Lidocaine jelly was administered per urethral meatus.    Preoperative abx where received prior to procedure.     Pre-Procedure: - Inspection reveals a normal caliber ureteral meatus.  Procedure: The flexible cystoscope was introduced without difficulty - No urethral strictures/lesions are present. - Enlarged prostate with lateral lobe obstruction - Elevated bladder neck - Bilateral ureteral orifices identified - Bladder mucosa  reveals no ulcers, tumors, or lesions - No bladder stones - No trabeculation  Retroflexion shows intravesical prostatic protrusion.   Post-Procedure: - Patient tolerated the procedure well  Assessment/ Plan: Rising PSA, cysto negative.  Needs a CT scan and prostate biopsy.  He will have to stop his Xarelto, will touch base with his PCP for this.

## 2016-04-11 ENCOUNTER — Encounter: Payer: Self-pay | Admitting: Psychiatry

## 2016-04-11 ENCOUNTER — Ambulatory Visit (INDEPENDENT_AMBULATORY_CARE_PROVIDER_SITE_OTHER): Payer: Medicare HMO | Admitting: Psychiatry

## 2016-04-11 ENCOUNTER — Telehealth: Payer: Self-pay

## 2016-04-11 VITALS — BP 112/75 | HR 77 | Temp 97.4°F | Wt 221.2 lb

## 2016-04-11 DIAGNOSIS — F411 Generalized anxiety disorder: Secondary | ICD-10-CM | POA: Diagnosis not present

## 2016-04-11 DIAGNOSIS — F331 Major depressive disorder, recurrent, moderate: Secondary | ICD-10-CM | POA: Diagnosis not present

## 2016-04-11 MED ORDER — ALPRAZOLAM 0.25 MG PO TABS: 0.2500 mg | ORAL_TABLET | Freq: Every day | ORAL | 2 refills | Status: DC | PRN

## 2016-04-11 MED ORDER — TRAZODONE HCL 100 MG PO TABS: 100.0000 mg | ORAL_TABLET | Freq: Every day | ORAL | 2 refills | Status: DC

## 2016-04-11 NOTE — Telephone Encounter (Signed)
The pt was cleared by his Cardiologist to stop his Xarelto 2 days prior to the biopsy is that okay. Please advise

## 2016-04-11 NOTE — Telephone Encounter (Signed)
Proceed with biospy, first available.

## 2016-04-11 NOTE — Progress Notes (Signed)
Psychiatric MD Progress Note   Patient Identification: Bruce [name] MRN:  [id number] Date of Evaluation:  04/11/2016 Referral Source: [facility]  Chief Complaint:   Chief Complaint    Follow-up; Medication Refill     Visit Diagnosis:    ICD-9-CM ICD-10-CM   1. MDD (major depressive disorder), recurrent episode, moderate (HCC) 296.32 F33.1   2. Generalized anxiety disorder 300.02 F41.1 ALPRAZolam (XANAX) 0.25 MG tablet    History of Present Illness:   Pt  is a 71 year old male who was referred from the [facility]. Patient was seen for follow-up appointment. He reported that heIs having difficulty walking due to pain in his leg. He reported that he is also on a blood thinner and is having problems in his teeth and gums. He reported that the dentist did not do anything. He has been losing teeth. He stated that he is gaining weight as he has been sitting most of the time. He has been compliant with his medications. He denied having any worsening of his depressive symptoms. He denied having any suicidal homicidal ideations or plans. We discussed about his medications in detail. He reported that he wants to continue his medications as prescribed.  He is planning to go to Dr. Humphrey Rolls  office today to inquire about his labs and follow-up with them.    Associated Signs/Symptoms: Depression Symptoms:  fatigue, anxiety, loss of energy/fatigue, disturbed sleep, (Hypo) Manic Symptoms:  Impulsivity, Irritable Mood, Anxiety Symptoms:  Excessive Worry, Psychotic Symptoms:  denied PTSD Symptoms: Negative NA  Past Psychiatric History:  Patient has history of hallucinations from Singulair which was stopped. He has never been admitted to a psychiatric hospital. He denied any history of suicide attempts.  Previous Psychotropic Medications: Xanax   Substance Abuse History in the last 12 months:  No.  Consequences of Substance Abuse: Negative NA  Past Medical History:  Past Medical  History:  Diagnosis Date  . Anginal pain ([place])    Left side if chest ,NTG  relieves chaes apin 12/01/13  . Arthritis   . 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 COPD (chronic obstructive pulmonary disease) ([place])   . Coronary artery disease   . Diabetes mellitus without complication ([place])    does not take medication or test blood sugar  . History of blood transfusion   . Hypertension   . Myocardial infarction    '09 AND '12  . Peripheral vascular disease ([place])   . Shortness of breath    With exertion .  Marland Kitchen UTI (urinary tract infection)    frequent UTI    Past Surgical History:  Procedure Laterality Date  . CARDIAC CATHETERIZATION    . CHOLECYSTECTOMY    . EXTERNAL FIXATION LEG Right 10/06/2013   Procedure: CLOSED REDUCTION RIGHT TIBIAL PLATEAU FRACTURE, EXTERNAL FIXATION RIGHT LEG, PLACEMENT OF WOUND VAC;  Surgeon: Rozanna Box, MD;  Location: [place];  Service: Orthopedics;  Laterality: Right;  . FEMORAL-POPLITEAL BYPASS GRAFT Right 10/06/2013   Procedure: RIGHT POPLITEAL-POPLITEAL ARTERY BYPASS GRAFT;  Surgeon: Rosetta Posner, MD;  Location: [place];  Service: Vascular;  Laterality: Right;  . HARDWARE REMOVAL Right 12/02/2013   Procedure: REMOVAL EXTERNAL FIXATION RIGHT LEG ;  Surgeon: Rozanna Box, MD;  Location: [facility];  Service: Orthopedics;  Laterality: Right;  . I&D EXTREMITY Right 10/09/2013   Procedure: IRRIGATION AND DEBRIDEMENT RIGHT LEG, CLOSURE  OF WOUNDS, PLACEMENT OF WOUND VAC ON EACH SIDE OF LEG;  Surgeon:  Rozanna Box, MD;  Location: [place];  Service: Orthopedics;  Laterality: Right;  . IVC filter  2009   placed @ [facility]/ Removed in 2010.  . IVC Filter Removed    . ligament leg Left   . ORIF TIBIA FRACTURE Right 12/29/2014   Procedure: OPEN REDUCTION INTERNAL FIXATION (ORIF) RIGHT TIBIA FRACTURE, RIA VS ICBG;  Surgeon: Altamese Chupadero, MD;  Location: [facility];  Service: Orthopedics;  Laterality: Right;    Family Psychiatric  History:  Denied    Family History:  Family History  Problem Relation Age of Onset  . Prostate cancer Brother     Social History:   Social History   Social History  . Marital status: Single    Spouse name: N/A  . Number of children: N/A  . Years of education: N/A   Social History Main Topics  . Smoking status: Current Some Day Smoker    Packs/day: 0.50    Years: 52.00  . Smokeless tobacco: Never Used  . Alcohol use No     Comment: Stopped   . Drug use: No  . Sexual activity: Not Currently   Other Topics Concern  . None   Social History Narrative   ** Merged History Encounter **        Additional Social History:  Son lives with him. He is 78 yo. Self employed.  Married x 2.  Unemployed now.   Allergies:   Allergies  Allergen Reactions  . Imdur [Isosorbide Dinitrate] Other (See Comments)    hallucinations  . Singulair [Montelukast Sodium] Other (See Comments)    Hallucinations     Metabolic Disorder Labs: No results found for: HGBA1C, MPG No results found for: PROLACTIN No results found for: CHOL, TRIG, HDL, CHOLHDL, VLDL, LDLCALC   Current Medications: Current Outpatient Prescriptions  Medication Sig Dispense Refill  . ALPRAZolam (XANAX) 0.25 MG tablet Take 1 tablet (0.25 mg total) by mouth daily as needed for anxiety. 30 tablet 2  . atorvastatin (LIPITOR) 80 MG tablet Take 80 mg by mouth at bedtime.     . benazepril-hydrochlorthiazide (LOTENSIN HCT) 20-12.5 MG per tablet Take 1 tablet by mouth daily.    Marland Kitchen CALCIUM CITRATE PO Take 250 mg by mouth at bedtime.     . Cholecalciferol (VITAMIN D3 PO) Take 1,000 tablets by mouth daily.     . DOCOSAHEXAENOIC ACID PO Take by mouth.    . fluticasone (FLONASE) 50 MCG/ACT nasal spray Place 1 spray into both nostrils daily.    Marland Kitchen loratadine (CLARITIN) 10 MG tablet Take 10 mg by mouth at bedtime.     . metoprolol succinate (TOPROL-XL) 50 MG 24 hr tablet Take 1 tablet (50 mg total) by mouth daily. (Patient taking  differently: Take 50 mg by mouth at bedtime. ) 90 tablet 0  . montelukast (SINGULAIR) 10 MG tablet Take 10 mg by mouth at bedtime.    . Multiple Vitamin (MULTIVITAMIN) tablet Take 1 tablet by mouth daily. For Men    . nitroGLYCERIN (NITROSTAT) 0.4 MG SL tablet Place 0.4 mg under the tongue every 5 (five) minutes as needed for chest pain.     . Omega-3 Fatty Acids (FISH OIL PO) Take 1 capsule by mouth daily.    . rivaroxaban (XARELTO) 20 MG TABS tablet Take 20 mg by mouth daily with supper.    . traMADol (ULTRAM) 50 MG tablet Take 50 mg by mouth 2 (two) times daily.    . traZODone (DESYREL) 100 MG tablet Take 1  tablet (100 mg total) by mouth at bedtime. 30 tablet 2   No current facility-administered medications for this visit.     Neurologic: Headache: No Seizure: No Paresthesias:No  Musculoskeletal: Strength & Muscle Tone: within normal limits Gait & Station: normal Patient leans: N/A  Psychiatric Specialty Exam: ROS  Blood pressure 112/75, pulse 77, temperature 97.4 F (36.3 C), temperature source Oral, weight 221 lb 3.2 oz (100.3 kg).Body mass index is 29.18 kg/m.  General Appearance: Casual  Eye Contact:  Fair  Speech:  Clear and Coherent  Volume:  Normal  Mood:  Anxious  Affect:  Negative  Thought Process:  Coherent  Orientation:  Full (Time, Place, and Person)  Thought Content:  WDL  Suicidal Thoughts:  No  Homicidal Thoughts:  No  Memory:  Immediate;   Fair Recent;   Fair Remote;   Fair  Judgement:  Fair  Insight:  Fair  Psychomotor Activity:  Normal  Concentration:  Concentration: Fair and Attention Span: Fair  Recall:  [employer] of Knowledge:Fair  Language: Fair  Akathisia:  No  Handed:  Right  AIMS (if indicated):    Assets:  Communication Skills Desire for Improvement Physical Health Social Support  ADL's:  Intact  Cognition: WNL  Sleep:      Treatment Plan Summary: Medication management    Discussed with patient about his medications  treatment risks benefits and alternatives. Continue  Xanax 0.25 mg by mouth daily when necessary to continue with his anxiety and he agreed with the plan. He will also be given trazodone 100 mg by mouth daily at bedtime.  Discussed with him about side effects of medication and he demonstrated understanding. Follow-up in 3  month or earlier depending on his symptoms   More than 50% of the time spent in psychoeducation, counseling and coordination of care.     This note was generated in part or whole with voice recognition software. Voice regonition is usually quite accurate but there are transcription errors that can and very often do occur. I apologize for any typographical errors that were not detected and corrected.     Rainey Pines, MD 2/6/20182:19 PM

## 2016-04-12 ENCOUNTER — Telehealth: Payer: Self-pay

## 2016-04-12 NOTE — Telephone Encounter (Addendum)
I spoke w/ the pt and informed him that he was cleared by his Cardiologist to  stop his Xarelto (2) days prior to the biopsy. He verbally stated understanding.

## 2016-04-12 NOTE — Telephone Encounter (Signed)
Proceed with biospy, first available.

## 2016-04-18 ENCOUNTER — Ambulatory Visit: Admission: RE | Admit: 2016-04-18 | Payer: Medicare HMO | Source: Ambulatory Visit

## 2016-04-18 ENCOUNTER — Ambulatory Visit: Payer: Self-pay | Admitting: Psychiatry

## 2016-05-09 ENCOUNTER — Ambulatory Visit
Admission: RE | Admit: 2016-05-09 | Discharge: 2016-05-09 | Disposition: A | Discharge status: Home / Self Care | Payer: Medicare HMO | Source: Ambulatory Visit | Attending: Urology | Admitting: Urology

## 2016-05-09 DIAGNOSIS — R3129 Other microscopic hematuria: Secondary | ICD-10-CM | POA: Insufficient documentation

## 2016-05-09 DIAGNOSIS — N200 Calculus of kidney: Secondary | ICD-10-CM | POA: Insufficient documentation

## 2016-05-09 DIAGNOSIS — N400 Enlarged prostate without lower urinary tract symptoms: Secondary | ICD-10-CM | POA: Insufficient documentation

## 2016-05-09 DIAGNOSIS — I700 Atherosclerosis of aorta: Secondary | ICD-10-CM | POA: Diagnosis not present

## 2016-05-09 HISTORY — DX: Heart failure, unspecified: I50.9

## 2016-05-09 LAB — POCT I-STAT CREATININE: Creatinine, Ser: 1.1 mg/dL (ref 0.61–1.24)

## 2016-05-09 MED ORDER — IOPAMIDOL (ISOVUE-300) INJECTION 61%
125.0000 mL | Freq: Once | INTRAVENOUS | Status: AC | PRN
Start: 2016-05-09 — End: 2016-05-09
  Administered 2016-05-09: 125 mL via INTRAVENOUS

## 2016-05-10 ENCOUNTER — Telehealth: Payer: Self-pay

## 2016-05-10 NOTE — Telephone Encounter (Signed)
Bruce Hughs, MD  Lestine Box, LPN        The patient needs to see Dr. Erlene Quan for consideration of PCNL.  Thanks,  bh

## 2016-05-18 ENCOUNTER — Encounter: Payer: Self-pay | Admitting: Urology

## 2016-05-18 ENCOUNTER — Ambulatory Visit (INDEPENDENT_AMBULATORY_CARE_PROVIDER_SITE_OTHER): Payer: Medicare HMO | Admitting: Urology

## 2016-05-18 VITALS — BP 133/73 | HR 81 | Ht 73.0 in | Wt 218.0 lb

## 2016-05-18 DIAGNOSIS — R972 Elevated prostate specific antigen [PSA]: Secondary | ICD-10-CM | POA: Diagnosis not present

## 2016-05-18 DIAGNOSIS — N200 Calculus of kidney: Secondary | ICD-10-CM

## 2016-05-18 NOTE — Progress Notes (Signed)
05/18/2016 10:13 AM   Bruce [name] 1945-04-15 [id number]  Referring provider: Kasandra Knudsen, NP [place], [place] [address]  Chief Complaint  Patient presents with  . Pre-op Exam    discuss PCNL    HPI: 71 year old male initially referred for elevated PSA followed by Dr. Louis Meckel scheduled to undergo prostate biopsy also noted to have microscopic hematuria on initial evaluation. 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  The stone measures approximate 2.5 cm in length.   He presents today for further consideration of possible percutaneous nephrolithotomy to address the stone.  He denies any flank pain. He has had recurrent urinary tract infections.    He denies knowing about having a kidney stone. Review of previous older images including a renal ultrasound dating back to 2011 do show calcification within the left kidney, measuring 1.6 cm at that time.  Past medical history significant for CAD with MI, COPD, and peripheral vascular disease.  He does take a Xarelto and has been cleared to come off of this for his prostate biopsy.   PMH: Past Medical History:  Diagnosis Date  . Anginal pain ([place])    Left side if chest ,NTG  relieves chaes apin 12/01/13  . Arthritis   . CHF (congestive heart failure) ([place])   . 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 COPD (chronic obstructive pulmonary disease) ([place])   . Coronary artery disease   . History of blood transfusion   . Hypertension   . Myocardial infarction    '09 AND '12  . Peripheral vascular disease ([place])   . Shortness of breath    With exertion .  Marland Kitchen UTI (urinary tract infection)    frequent UTI    Surgical History: Past Surgical History:  Procedure Laterality Date  . CARDIAC CATHETERIZATION    . CHOLECYSTECTOMY     . EXTERNAL FIXATION LEG Right 10/06/2013   Procedure: CLOSED REDUCTION RIGHT TIBIAL PLATEAU FRACTURE, EXTERNAL FIXATION RIGHT LEG, PLACEMENT OF WOUND VAC;  Surgeon: Rozanna Box, MD;  Location: [facility];  Service: Orthopedics;  Laterality: Right;  . FEMORAL-POPLITEAL BYPASS GRAFT Right 10/06/2013   Procedure: RIGHT POPLITEAL-POPLITEAL ARTERY BYPASS GRAFT;  Surgeon: Rosetta Posner, MD;  Location: [facility];  Service: Vascular;  Laterality: Right;  . HARDWARE REMOVAL Right 12/02/2013   Procedure: REMOVAL EXTERNAL FIXATION RIGHT LEG ;  Surgeon: Rozanna Box, MD;  Location: [facility];  Service: Orthopedics;  Laterality: Right;  . I&D EXTREMITY Right 10/09/2013   Procedure: IRRIGATION AND DEBRIDEMENT RIGHT LEG, CLOSURE  OF WOUNDS, PLACEMENT OF WOUND VAC ON EACH SIDE OF LEG;  Surgeon: Rozanna Box, MD;  Location: [facility];  Service: Orthopedics;  Laterality: Right;  . IVC filter  2009   placed @ [facility]/ Removed in 2010.  . IVC Filter Removed    . ligament leg Left   . ORIF TIBIA FRACTURE Right 12/29/2014   Procedure: OPEN REDUCTION INTERNAL FIXATION (ORIF) RIGHT TIBIA FRACTURE, RIA VS ICBG;  Surgeon: Altamese Skyline, MD;  Location: [facility];  Service: Orthopedics;  Laterality: Right;    Home Medications:  Allergies as of 05/18/2016      Reactions   Imdur [isosorbide Dinitrate] Other (See Comments)   hallucinations   Singulair [montelukast Sodium] Other (See Comments)   Hallucinations  Medication List       Accurate as of 05/18/16 11:59 PM. Always use your most recent med list.          ALPRAZolam 0.25 MG tablet Commonly known as:  XANAX Take 1 tablet (0.25 mg total) by mouth daily as needed for anxiety.   atorvastatin 80 MG tablet Commonly known as:  LIPITOR Take 80 mg by mouth at bedtime.   benazepril-hydrochlorthiazide 20-12.5 MG tablet Commonly known as:  LOTENSIN HCT Take 1 tablet by mouth daily.   CALCIUM CITRATE PO Take 250 mg by mouth at bedtime.   DOCOSAHEXAENOIC ACID PO Take by  mouth.   FISH OIL PO Take 1 capsule by mouth daily.   fluticasone 50 MCG/ACT nasal spray Commonly known as:  FLONASE Place 1 spray into both nostrils daily.   loratadine 10 MG tablet Commonly known as:  CLARITIN Take 10 mg by mouth at bedtime.   metoprolol succinate 50 MG 24 hr tablet Commonly known as:  TOPROL-XL Take 1 tablet (50 mg total) by mouth daily.   montelukast 10 MG tablet Commonly known as:  SINGULAIR Take 10 mg by mouth at bedtime.   multivitamin tablet Take 1 tablet by mouth daily. For Men   nitroGLYCERIN 0.4 MG SL tablet Commonly known as:  NITROSTAT Place 0.4 mg under the tongue every 5 (five) minutes as needed for chest pain.   rivaroxaban 20 MG Tabs tablet Commonly known as:  XARELTO Take 20 mg by mouth daily with supper.   traMADol 50 MG tablet Commonly known as:  ULTRAM Take 50 mg by mouth 2 (two) times daily.   traZODone 100 MG tablet Commonly known as:  DESYREL Take 1 tablet (100 mg total) by mouth at bedtime.   VITAMIN D3 PO Take 1,000 tablets by mouth daily.       Allergies:  Allergies  Allergen Reactions  . Imdur [Isosorbide Dinitrate] Other (See Comments)    hallucinations  . Singulair [Montelukast Sodium] Other (See Comments)    Hallucinations     Family History: Family History  Problem Relation Age of Onset  . Prostate cancer Brother     Social History:  reports that he has been smoking.  He has a 26.00 pack-year smoking history. He has never used smokeless tobacco. He reports that he does not drink alcohol or use drugs.  ROS: UROLOGY Frequent Urination?: No Hard to postpone urination?: No Burning/pain with urination?: No Get up at night to urinate?: No Leakage of urine?: No Urine stream starts and stops?: No Trouble starting stream?: No Do you have to strain to urinate?: No Blood in urine?: No Urinary tract infection?: No Sexually transmitted disease?: No Injury to kidneys or bladder?: No Painful intercourse?:  No Weak stream?: No Erection problems?: No Penile pain?: No  Gastrointestinal Nausea?: No Vomiting?: No Indigestion/heartburn?: No Diarrhea?: No Constipation?: No  Constitutional Fever: No Night sweats?: No Weight loss?: No Fatigue?: No  Skin Skin rash/lesions?: No Itching?: No  Eyes Blurred vision?: No Double vision?: No  Ears/Nose/Throat Sore throat?: No Sinus problems?: No  Hematologic/Lymphatic Swollen glands?: No Easy bruising?: No  Cardiovascular Leg swelling?: No Chest pain?: No  Respiratory Cough?: No Shortness of breath?: No  Endocrine Excessive thirst?: No  Musculoskeletal Back pain?: No Joint pain?: No  Neurological Headaches?: No Dizziness?: No  Psychologic Depression?: No Anxiety?: No  Physical Exam: BP 133/73   Pulse 81   Ht 6\' 1"  (1.854 m)   Wt 218 lb (98.9 kg)   BMI 28.76  kg/m   Constitutional:  Alert and oriented, No acute distress. HEENT: [place] AT, moist mucus membranes.  Trachea midline, no masses. Cardiovascular: No clubbing, cyanosis, or edema. Respiratory: Normal respiratory effort, no increased work of breathing. GI: Abdomen is soft, nontender, nondistended, no abdominal masses GU: No CVA tenderness.  Skin: No rashes, bruises or suspicious lesions. Neurologic: Grossly intact, no focal deficits, moving all 4 extremities. Psychiatric: Normal mood and affect.  Laboratory Data: Lab Results  Component Value Date   WBC 11.2 (H) 03/09/2015   HGB 13.2 03/09/2015   HCT 39.4 (L) 03/09/2015   MCV 92.1 03/09/2015   PLT 243 03/09/2015    Lab Results  Component Value Date   CREATININE 1.10 05/09/2016    Urinalysis    Component Value Date/Time   APPEARANCEUR Clear 04/10/2016 1035   GLUCOSEU Negative 04/10/2016 1035   BILIRUBINUR Negative 04/10/2016 1035   PROTEINUR 1+ (A) 04/10/2016 1035   NITRITE Negative 04/10/2016 1035   LEUKOCYTESUR Negative 04/10/2016 1035    Pertinent Imaging: CLINICAL DATA:  Elevated PSA  level. Urgency, frequency and DC area for 6 months.  EXAM: CT ABDOMEN AND PELVIS WITHOUT AND WITH CONTRAST  TECHNIQUE: Multidetector CT imaging of the abdomen and pelvis was performed following the standard protocol before and following the bolus administration of intravenous contrast.  CONTRAST:  117mL ISOVUE-300 IOPAMIDOL (ISOVUE-300) INJECTION 61%  COMPARISON:  None.  FINDINGS: Lower chest: The lung bases are clear. The heart is normal in size. No pericardial effusion. The esophagus is normal.  Hepatobiliary: No focal hepatic lesions or intrahepatic biliary dilatation. The gallbladder is surgically absent. No significant common bile duct dilatation.  Pancreas: No mass, inflammation or ductal dilatation.  Spleen: Normal size.  No focal lesions.  Adrenals/Urinary Tract: The adrenal glands are normal.  There is segmental wall staghorn type calculus noted in the midpole region of the left kidney. No right-sided renal calculi. No ureteral calculi or bladder calculi. After contrast administration both kidneys demonstrate normal enhancement/perfusion. A few tiny low-attenuation lesions are likely cysts. No worrisome lesions. The delayed images do not demonstrate any significant collecting system abnormalities other than the left staghorn calculus. Both ureters are normal. The bladder is normal. No bladder mass. Mild uniform bladder wall thickening likely due to partial bladder outlet obstruction from mild prostate gland enlargement and median lobe hypertrophy impressing on the base of the bladder.  Stomach/Bowel: The stomach, duodenum, small bowel and colon are grossly normal without oral contrast. No inflammatory changes, mass lesions or obstructive findings. The terminal ileum and appendix are normal.  Vascular/Lymphatic: The aorta and branch vessels are patent. Moderate atherosclerotic calcifications. The major venous structures are patent. Small scattered  mesenteric and retroperitoneal lymph nodes but no mass or overt adenopathy.  Reproductive: Mild prostate gland enlargement with median lobe hypertrophy impressing on the base of the bladder. The seminal vesicles are unremarkable.  Other: No pelvic mass or adenopathy. No free pelvic fluid collections. No inguinal mass or adenopathy. No abdominal wall hernia or subcutaneous lesions. Small right inguinal hernia containing fat and slightly prominent vessels.  Musculoskeletal: No significant bony findings. Moderate degenerative changes involving the spine.  IMPRESSION: 1. Segmental staghorn type calculus in the midpole region of the left kidney. 2. No renal or bladder lesions. 3. Mild uniform bladder wall thickening could be due to partial bladder outlet obstruction. Mild prostate gland enlargement with median lobe hypertrophy impressing on the base of the bladder. 4. No acute abdominal/pelvic findings, mass lesions or lymphadenopathy. 5. Moderate atherosclerotic calcifications  involving the aorta and iliac arteries.   Electronically Signed   By: Marijo Sanes M.D.   On: 05/09/2016 09:32  CT scan was personally reviewed today with the patient. This was compared to previous renal ultrasound in 2011 which was also personally reviewed.  Assessment & Plan:    1. 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 He is asymptomatic but does have recurrent UTIs which may or may not be related to this.  We discussed options for management of the stone including left PCNL versus staged ureteroscopy. We discussed that the stone free clearance rate is much higher with percutaneous nephrolithotomy. Risk of the surgery were discussed in detail including risk of bleeding, need for blood transfusion, infection, urine leak, damage to surrounding structures, need for multiple procedures, amongst others. I explained the usual course including placement  of percutaneous nephrostomy tube placement, the procedure itself, in the usual postoperative course including need for possible indwelling ureteral stent, nephrostomy tube, and Foley catheter. All his questions were answered today in detail.  He will need clearance to hold Xarelto for the procedure. He is willing to proceed as planned. We will defer surgery until following his prostate biopsy.  2. Elevated PSA Recommend proceeding with prostate biopsy is planned.    Return for follow up for prostate biopsy as scheduled.  Hollice Espy, MD  [facility] [address] [place], [place] [address] [phone]  I spent 25 min with this patient of which greater than 50% was spent in counseling and coordination of care with the patient.

## 2016-05-25 ENCOUNTER — Ambulatory Visit: Payer: Medicare HMO

## 2016-05-29 ENCOUNTER — Other Ambulatory Visit: Payer: Self-pay

## 2016-05-29 DIAGNOSIS — N200 Calculus of kidney: Secondary | ICD-10-CM

## 2016-05-30 ENCOUNTER — Ambulatory Visit (INDEPENDENT_AMBULATORY_CARE_PROVIDER_SITE_OTHER): Payer: Medicare HMO | Admitting: Urology

## 2016-05-30 ENCOUNTER — Encounter: Payer: Self-pay | Admitting: Urology

## 2016-05-30 ENCOUNTER — Other Ambulatory Visit: Payer: Self-pay | Admitting: Urology

## 2016-05-30 VITALS — BP 129/73 | HR 61 | Ht 73.0 in | Wt 219.0 lb

## 2016-05-30 DIAGNOSIS — R972 Elevated prostate specific antigen [PSA]: Secondary | ICD-10-CM | POA: Diagnosis not present

## 2016-05-30 MED ORDER — LEVOFLOXACIN 500 MG PO TABS
500.0000 mg | ORAL_TABLET | Freq: Once | ORAL | Status: AC
  Administered 2016-05-30: 500 mg via ORAL

## 2016-05-30 MED ORDER — GENTAMICIN SULFATE 40 MG/ML IJ SOLN
80.0000 mg | Freq: Once | INTRAMUSCULAR | Status: AC
  Administered 2016-05-30: 80 mg via INTRAMUSCULAR

## 2016-05-30 NOTE — Progress Notes (Signed)
Prostate Biopsy Procedure   Informed consent was obtained after discussing risks/benefits of the procedure.  A time out was performed to ensure correct patient identity.  Pre-Procedure: - Last PSA Level: No results found for: PSA - Gentamicin given prophylactically - Levaquin 500 mg administered PO -Transrectal Ultrasound performed revealing a 56.18 gm prostate -No significant hypoechoic or median lobe noted  Procedure: - Prostate block performed using 10 cc 1% lidocaine and biopsies taken from sextant areas, a total of 12 under ultrasound guidance.  Post-Procedure: - Patient tolerated the procedure well - He was counseled to seek immediate medical attention if experiences any severe pain, significant bleeding, or fevers - Return in one week to discuss biopsy results

## 2016-06-05 ENCOUNTER — Telehealth: Payer: Self-pay | Admitting: Radiology

## 2016-06-05 NOTE — Telephone Encounter (Signed)
Notified pt of surgery & pre-admit testing appts. Advised pt to hold Xarelto beginning 06/17/16 per Dr Humphrey Rolls. Questions were answered & pt voices understanding.

## 2016-06-06 ENCOUNTER — Other Ambulatory Visit: Payer: Self-pay | Admitting: Urology

## 2016-06-06 LAB — PATHOLOGY REPORT

## 2016-06-12 ENCOUNTER — Other Ambulatory Visit: Payer: Self-pay | Admitting: Radiology

## 2016-06-12 ENCOUNTER — Encounter
Admission: RE | Admit: 2016-06-12 | Discharge: 2016-06-12 | Disposition: A | Discharge status: Home / Self Care | Payer: Medicare HMO | Source: Ambulatory Visit | Attending: Urology | Admitting: Urology

## 2016-06-12 ENCOUNTER — Other Ambulatory Visit: Payer: Self-pay

## 2016-06-12 DIAGNOSIS — I252 Old myocardial infarction: Secondary | ICD-10-CM | POA: Insufficient documentation

## 2016-06-12 DIAGNOSIS — I10 Essential (primary) hypertension: Secondary | ICD-10-CM | POA: Insufficient documentation

## 2016-06-12 DIAGNOSIS — Z8042 Family history of malignant neoplasm of prostate: Secondary | ICD-10-CM | POA: Insufficient documentation

## 2016-06-12 DIAGNOSIS — I2510 Atherosclerotic heart disease of native coronary artery without angina pectoris: Secondary | ICD-10-CM | POA: Insufficient documentation

## 2016-06-12 DIAGNOSIS — Z888 Allergy status to other drugs, medicaments and biological substances status: Secondary | ICD-10-CM | POA: Insufficient documentation

## 2016-06-12 DIAGNOSIS — J449 Chronic obstructive pulmonary disease, unspecified: Secondary | ICD-10-CM | POA: Diagnosis not present

## 2016-06-12 DIAGNOSIS — Z9889 Other specified postprocedural states: Secondary | ICD-10-CM | POA: Diagnosis not present

## 2016-06-12 DIAGNOSIS — Z79899 Other long term (current) drug therapy: Secondary | ICD-10-CM | POA: Insufficient documentation

## 2016-06-12 DIAGNOSIS — N200 Calculus of kidney: Secondary | ICD-10-CM | POA: Insufficient documentation

## 2016-06-12 DIAGNOSIS — Z01812 Encounter for preprocedural laboratory examination: Secondary | ICD-10-CM | POA: Diagnosis not present

## 2016-06-12 DIAGNOSIS — Z7901 Long term (current) use of anticoagulants: Secondary | ICD-10-CM | POA: Insufficient documentation

## 2016-06-12 DIAGNOSIS — R972 Elevated prostate specific antigen [PSA]: Secondary | ICD-10-CM | POA: Insufficient documentation

## 2016-06-12 DIAGNOSIS — Z87440 Personal history of urinary (tract) infections: Secondary | ICD-10-CM | POA: Diagnosis not present

## 2016-06-12 DIAGNOSIS — Z9049 Acquired absence of other specified parts of digestive tract: Secondary | ICD-10-CM | POA: Insufficient documentation

## 2016-06-12 HISTORY — DX: Adverse effect of unspecified anesthetic, initial encounter: T41.45XA

## 2016-06-12 HISTORY — DX: Headache: R51

## 2016-06-12 HISTORY — DX: Anxiety disorder, unspecified: F41.9

## 2016-06-12 HISTORY — DX: Gastro-esophageal reflux disease without esophagitis: K21.9

## 2016-06-12 HISTORY — DX: Other complications of anesthesia, initial encounter: T88.59XA

## 2016-06-12 HISTORY — DX: Headache, unspecified: R51.9

## 2016-06-12 HISTORY — DX: Personal history of urinary calculi: Z87.442

## 2016-06-12 LAB — URINALYSIS, ROUTINE W REFLEX MICROSCOPIC
BACTERIA UA: NONE SEEN
Bilirubin Urine: NEGATIVE
GLUCOSE, UA: NEGATIVE mg/dL
KETONES UR: NEGATIVE mg/dL
Leukocytes, UA: NEGATIVE
NITRITE: NEGATIVE
PROTEIN: 30 mg/dL — AB
SQUAMOUS EPITHELIAL / LPF: NONE SEEN
Specific Gravity, Urine: 1.023 (ref 1.005–1.030)
pH: 5 (ref 5.0–8.0)

## 2016-06-12 LAB — TYPE AND SCREEN
ABO/RH(D): B POS
Antibody Screen: NEGATIVE

## 2016-06-12 LAB — CBC
HCT: 44.2 % (ref 40.0–52.0)
HEMOGLOBIN: 15 g/dL (ref 13.0–18.0)
MCH: 32 pg (ref 26.0–34.0)
MCHC: 34 g/dL (ref 32.0–36.0)
MCV: 94.1 fL (ref 80.0–100.0)
PLATELETS: 242 10*3/uL (ref 150–440)
RBC: 4.69 MIL/uL (ref 4.40–5.90)
RDW: 13.8 % (ref 11.5–14.5)
WBC: 7.2 10*3/uL (ref 3.8–10.6)

## 2016-06-12 LAB — BASIC METABOLIC PANEL
ANION GAP: 6 (ref 5–15)
BUN: 11 mg/dL (ref 6–20)
CHLORIDE: 101 mmol/L (ref 101–111)
CO2: 31 mmol/L (ref 22–32)
Calcium: 9.9 mg/dL (ref 8.9–10.3)
Creatinine, Ser: 1.32 mg/dL — ABNORMAL HIGH (ref 0.61–1.24)
GFR calc Af Amer: >60 mL/min (ref >60)
GFR, EST NON AFRICAN AMERICAN: 54 mL/min — AB (ref >60)
Glucose, Bld: 131 mg/dL — ABNORMAL HIGH (ref 65–99)
POTASSIUM: 4.4 mmol/L (ref 3.5–5.1)
Sodium: 138 mmol/L (ref 135–145)

## 2016-06-12 LAB — PROTIME-INR
INR: 1.74
PROTHROMBIN TIME: 20.6 s — AB (ref 11.4–15.2)

## 2016-06-12 LAB — APTT: APTT: 38 s — AB (ref 24–36)

## 2016-06-12 NOTE — Patient Instructions (Signed)
  Your procedure is scheduled on: Monday June 19, 2016. Report to Same Day Surgery. To find out your arrival time please call [phone] between 1PM - 3PM on Friday June 16, 2016.  Remember: Instructions that are not followed completely may result in serious medical risk, up to and including death, or upon the discretion of your surgeon and anesthesiologist your surgery may need to be rescheduled.    _x___ 1. Do not eat food or drink liquids after midnight. No gum chewing or hard candies.     ____ 2. No Alcohol for 24 hours before or after surgery.   ____ 3. Bring all medications with you on the day of surgery if instructed.    __x__ 4. Notify your doctor if there is any change in your medical condition     (cold, fever, infections).    __x___ 5. No smoking 24 hours prior to surgery.     Do not wear jewelry, make-up, hairpins, clips or nail polish.  Do not wear lotions, powders, or perfumes.   Do not shave 48 hours prior to surgery. Men may shave face and neck.  Do not bring valuables to the hospital.    [facility] is not responsible for any belongings or valuables.               Contacts, dentures or bridgework may not be worn into surgery.  Leave your suitcase in the car. After surgery it may be brought to your room.  For patients admitted to the hospital, discharge time is determined by your treatment team.   Patients discharged the day of surgery will not be allowed to drive home.    Please read over the following fact sheets that you were given:   [facility] Preparing for Surgery  _x___ Take these medicines the morning of surgery with A SIP OF WATER:    1. omeprazole (PRILOSEC)   2. ranolazine (RANEXA    ____ Fleet Enema (as directed)   __x__ Use CHG Soap as directed on instruction sheet  ____ Use inhalers on the day of surgery and bring to hospital day of surgery  ____ Stop metformin 2 days prior to surgery    ____ Take 1/2 of usual insulin dose the night  before surgery and none on the morning of surgery.   _x___ Stop rivaroxaban Alveda Reasons) on Friday June 16, 2016, (last dose to be taken on Thursday).  _x___ Stop Anti-inflammatories such as Advil, Aleve, Ibuprofen, Motrin, Naproxen, Naprosyn, Goodies powders or aspirin products. OK to take Tylenol.   _x___ Stop supplements: Omega-3 Fatty Acids (FISH OIL PO),  until after surgery.    ____ Bring C-Pap to the hospital.

## 2016-06-12 NOTE — Pre-Procedure Instructions (Addendum)
During today's interview, pt reported he does not read very well, left school after the 4th grade.  Reviewed all instructions with pt verbally, asked pt questions regarding these instruction, he was able to answer those questions correctly, indicating he understood instructions.  [facility] to request most recent EKG, which was done in February 2018.

## 2016-06-12 NOTE — Pre-Procedure Instructions (Signed)
Abnormal labs from today's visit faxed to Amy at Dr. Cherrie Gauze office for review.  Received EKG from [facility], shows NSR, discussed with S. Fields Therapist, sports, OK to proceed no further testing or clearance needed.

## 2016-06-13 ENCOUNTER — Ambulatory Visit: Payer: Self-pay

## 2016-06-13 LAB — URINE CULTURE: CULTURE: NO GROWTH

## 2016-06-15 ENCOUNTER — Encounter: Payer: Self-pay | Admitting: Urology

## 2016-06-15 ENCOUNTER — Ambulatory Visit (INDEPENDENT_AMBULATORY_CARE_PROVIDER_SITE_OTHER): Payer: Medicare HMO | Admitting: Urology

## 2016-06-15 VITALS — BP 96/59 | HR 80 | Ht 73.0 in | Wt 211.0 lb

## 2016-06-15 DIAGNOSIS — C61 Malignant neoplasm of prostate: Secondary | ICD-10-CM

## 2016-06-15 DIAGNOSIS — N200 Calculus of kidney: Secondary | ICD-10-CM | POA: Diagnosis not present

## 2016-06-15 NOTE — Progress Notes (Signed)
06/15/2016 12:11 PM   KJ IMBERT 1945/06/03 [id number]  Referring provider: Danelle Berry, NP [address] [place], [place] [address]  Chief Complaint  Patient presents with  . Prostate Cancer    biopsy results    HPI: 71 year old male with with newly diagnosed prostate cancer who presents today to review his pathology report. 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  He initially underwent prostate biopsy secondary to a PSA of 13.5 on 02/20/2016.   Repeat PSA was 18.5 on 04/06/2016.  Rectal exam was enlarged without nodules.  TRUS volume 56 g with intravesical protrusion of the median lobe (on cysto/ CT).  Prostate biopsy pathology shows Gleason 4+3 and 3+3 involving 4 of 12 cores, all on the right side.  Specifically, the right lateral base, the core comprises of 70% of Gleason 4 tumor involving up to 94% of the tissue.  CT abdomen pelvis with and without contrast on 05/09/2016 shows no evidence of lymphadenopathy or metastatic disease, although small mesenteric and retroperitoneal lymph nodes were noted but not reaching pathologic size.  Other findings as below.  At baseline, he has minimal urinary complaints. He has a decent stream without urgency or frequency. He is unsure if he has a history of erectile dysfunction, has not gotten an erection and many years, is not currently sexually active, and is not worried about his erectile status.  Past medical history significant for CAD with MI, COPD, and peripheral vascular disease.  He does take a Xarelto and was cleared to come off of this for his prostate biopsy and upcomming PCNL.  PMH: Past Medical History:  Diagnosis Date  . Anginal pain ([place])    Left side if chest ,NTG  relieves chaes apin 12/01/13  . Anxiety   . Arthritis   . CHF (congestive heart failure) ([place])   . 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 Complication of anesthesia    wake up with a head ache  . COPD (chronic obstructive pulmonary disease) ([place])   . Coronary artery disease   . GERD (gastroesophageal reflux disease)   . Headache    related to sinus congestion  . History of blood transfusion   . History of kidney stones   . Hypertension   . Myocardial infarction    '09 AND '12  . Peripheral vascular disease ([place])   . Shortness of breath    With exertion .  Marland Kitchen UTI (urinary tract infection)    frequent UTI    Surgical History: Past Surgical History:  Procedure Laterality Date  . CARDIAC CATHETERIZATION    . CHOLECYSTECTOMY    . EXTERNAL FIXATION LEG Right 10/06/2013   Procedure: CLOSED REDUCTION RIGHT TIBIAL PLATEAU FRACTURE, EXTERNAL FIXATION RIGHT LEG, PLACEMENT OF WOUND VAC;  Surgeon: Rozanna Box, MD;  Location: [facility] [place];  Service: Orthopedics;  Laterality: Right;  . FEMORAL-POPLITEAL BYPASS GRAFT Right 10/06/2013   Procedure: RIGHT POPLITEAL-POPLITEAL ARTERY BYPASS GRAFT;  Surgeon: Rosetta Posner, MD;  Location: [place];  Service: Vascular;  Laterality: Right;  . FRACTURE SURGERY Right 2014  . HARDWARE REMOVAL Right 12/02/2013   Procedure: REMOVAL EXTERNAL FIXATION RIGHT LEG ;  Surgeon: Rozanna Box, MD;  Location: [facility];  Service: Orthopedics;  Laterality: Right;  . HERNIA REPAIR Right 1990's  . I&D EXTREMITY Right 10/09/2013   Procedure: IRRIGATION AND DEBRIDEMENT RIGHT LEG,  CLOSURE  OF WOUNDS, PLACEMENT OF WOUND VAC ON EACH SIDE OF LEG;  Surgeon: Rozanna Box, MD;  Location: [facility];  Service: Orthopedics;  Laterality: Right;  . IVC filter  2009   placed @ [facility]/ Removed in 2010.  . IVC Filter Removed    . ligament leg Left   . ORIF TIBIA FRACTURE Right 12/29/2014   Procedure: OPEN REDUCTION INTERNAL FIXATION (ORIF) RIGHT TIBIA FRACTURE, RIA VS ICBG;  Surgeon: Altamese Bancroft, MD;  Location: [facility];  Service: Orthopedics;  Laterality: Right;    Home Medications:  Allergies as of 06/15/2016       Reactions   Adhesive [tape] Other (See Comments)   After right leg fracture surgery, pt developed a large blister where tape was applied to his right leg. OK to use paper tape.   Imdur [isosorbide Dinitrate] Other (See Comments)   hallucinations   Singulair [montelukast Sodium] Other (See Comments)   Hallucinations      Medication List       Accurate as of 06/15/16 12:11 PM. Always use your most recent med list.          allopurinol 100 MG tablet Commonly known as:  ZYLOPRIM Take 100 mg by mouth at bedtime.   ALPRAZolam 0.25 MG tablet Commonly known as:  XANAX Take 1 tablet (0.25 mg total) by mouth daily as needed for anxiety.   atorvastatin 80 MG tablet Commonly known as:  LIPITOR Take 80 mg by mouth at bedtime.   benazepril-hydrochlorthiazide 20-12.5 MG tablet Commonly known as:  LOTENSIN HCT Take 1 tablet by mouth daily.   CALCIUM CITRATE PO Take 250 mg by mouth at bedtime.   DOCOSAHEXAENOIC ACID PO Take by mouth.   docusate sodium 100 MG capsule Commonly known as:  COLACE Take 100 mg by mouth daily as needed for mild constipation.   FISH OIL PO Take 1 capsule by mouth daily.   fluticasone 50 MCG/ACT nasal spray Commonly known as:  FLONASE Place 1 spray into both nostrils daily.   furosemide 20 MG tablet Commonly known as:  LASIX Take 20 mg by mouth daily.   hydrOXYzine 25 MG tablet Commonly known as:  ATARAX/VISTARIL Take 25 mg by mouth at bedtime as needed. sleep   loratadine 10 MG tablet Commonly known as:  CLARITIN Take 10 mg by mouth at bedtime.   metoprolol succinate 50 MG 24 hr tablet Commonly known as:  TOPROL-XL Take 1 tablet (50 mg total) by mouth daily.   multivitamin tablet Take 1 tablet by mouth daily. For Men   nitroGLYCERIN 0.4 MG SL tablet Commonly known as:  NITROSTAT Place 0.4 mg under the tongue every 5 (five) minutes as needed for chest pain.   omeprazole 40 MG capsule Commonly known as:  PRILOSEC Take 40 mg by mouth  daily.   PROBIOTIC FORMULA PO Take 1 Dose by mouth at bedtime.   RANEXA 1000 MG SR tablet Generic drug:  ranolazine Take 500 mg by mouth every morning.   rivaroxaban 20 MG Tabs tablet Commonly known as:  XARELTO Take 20 mg by mouth daily with supper.   traMADol 50 MG tablet Commonly known as:  ULTRAM Take 50 mg by mouth 2 (two) times daily.   traZODone 100 MG tablet Commonly known as:  DESYREL Take 1 tablet (100 mg total) by mouth at bedtime.   VITAMIN D3 PO Take 1,000 tablets by mouth daily.       Allergies:  Allergies  Allergen Reactions  . Adhesive [  Tape] Other (See Comments)    After right leg fracture surgery, pt developed a large blister where tape was applied to his right leg. OK to use paper tape.  . Imdur [Isosorbide Dinitrate] Other (See Comments)    hallucinations  . Singulair [Montelukast Sodium] Other (See Comments)    Hallucinations     Family History: Family History  Problem Relation Age of Onset  . Prostate cancer Brother     Social History:  reports that he has been smoking.  He has a 26.00 pack-year smoking history. He has never used smokeless tobacco. He reports that he does not drink alcohol or use drugs.  ROS: UROLOGY Frequent Urination?: No Hard to postpone urination?: No Burning/pain with urination?: No Get up at night to urinate?: No Leakage of urine?: No Urine stream starts and stops?: No Trouble starting stream?: No Do you have to strain to urinate?: No Blood in urine?: Yes Urinary tract infection?: No Sexually transmitted disease?: No Injury to kidneys or bladder?: No Painful intercourse?: No Weak stream?: No Erection problems?: No Penile pain?: No  Gastrointestinal Nausea?: No Vomiting?: No Indigestion/heartburn?: No Diarrhea?: No Constipation?: No  Constitutional Fever: No Night sweats?: No Weight loss?: No Fatigue?: No  Skin Skin rash/lesions?: No Itching?: No  Eyes Blurred vision?: No Double vision?:  No  Ears/Nose/Throat Sore throat?: No Sinus problems?: No  Hematologic/Lymphatic Swollen glands?: No Easy bruising?: No  Cardiovascular Leg swelling?: No Chest pain?: No  Respiratory Cough?: No Shortness of breath?: No  Endocrine Excessive thirst?: No  Musculoskeletal Back pain?: No Joint pain?: No  Neurological Headaches?: No Dizziness?: No  Psychologic Depression?: No Anxiety?: No  Physical Exam: BP (!) 96/59   Pulse 80   Ht 6\' 1"  (1.854 m)   Wt 211 lb (95.7 kg)   BMI 27.84 kg/m   Constitutional:  Alert and oriented, No acute distress. HEENT: [place] AT, moist mucus membranes.  Trachea midline, no masses. Cardiovascular: No clubbing, cyanosis, or edema. Respiratory: Normal respiratory effort, no increased work of breathing. GI: Abdomen is soft, nontender, nondistended, no abdominal masses GU: No CVA tenderness.  Skin: No rashes, bruises or suspicious lesions. Neurologic: Grossly intact, no focal deficits, moving all 4 extremities. Psychiatric: Normal mood and affect.  Laboratory Data: Lab Results  Component Value Date   WBC 7.2 06/12/2016   HGB 15.0 06/12/2016   HCT 44.2 06/12/2016   MCV 94.1 06/12/2016   PLT 242 06/12/2016    Lab Results  Component Value Date   CREATININE 1.32 (H) 06/12/2016   Component     Latest Ref Rng & Units 04/06/2016  PSA     0.0 - 4.0 ng/mL 18.5 (H)    Urinalysis    Component Value Date/Time   COLORURINE AMBER (A) 06/12/2016 0902   APPEARANCEUR CLOUDY (A) 06/12/2016 0902   APPEARANCEUR Clear 04/10/2016 1035   LABSPEC 1.023 06/12/2016 0902   PHURINE 5.0 06/12/2016 0902   GLUCOSEU NEGATIVE 06/12/2016 0902   HGBUR LARGE (A) 06/12/2016 0902   BILIRUBINUR NEGATIVE 06/12/2016 0902   BILIRUBINUR Negative 04/10/2016 1035   KETONESUR NEGATIVE 06/12/2016 0902   PROTEINUR 30 (A) 06/12/2016 0902   NITRITE NEGATIVE 06/12/2016 0902   LEUKOCYTESUR NEGATIVE 06/12/2016 0902   LEUKOCYTESUR Negative 04/10/2016 1035     Pertinent Imaging: CT abdomen pelvis with and without contrast from 05/09/2016 reviewed again today personally  IMPRESSION: 1. Segmental staghorn type calculus in the midpole region of the left kidney. 2. No renal or bladder lesions. 3. Mild uniform bladder wall  thickening could be due to partial bladder outlet obstruction. Mild prostate gland enlargement with median lobe hypertrophy impressing on the base of the bladder. 4. No acute abdominal/pelvic findings, mass lesions or lymphadenopathy. 5. Moderate atherosclerotic calcifications involving the aorta and iliac arteries.   Assessment & Plan:    1. Prostate cancer ([place]) Intermediate (borderline high) risk Gleason 4+3 prostate cancer on the right, PSA 18 without evidence of metastatic disease on CT abdomen pelvis.  MSK nomogram 10/15 year progression free survival 49%/34%, OCD 25%, ECE 70%, LN 9% and 9%, and SV 7%.   Given the extent of his disease and presented to have higher risk Gleason 4, I have recommended curative intervention either in the form of prostatectomy over external beam radiation with ADT.  I would also like to get a bone scan given the extent of his disease to rule out occult boney metastasis.  The patient was counseled about the natural history of prostate cancer and the standard treatment options that are available for prostate cancer. It was explained to him how his age and life expectancy, clinical stage, Gleason score, and PSA affect his prognosis, the decision to proceed with additional staging studies, as well as how that information influences recommended treatment strategies. We discussed the roles for active surveillance, radiation therapy, surgical therapy, androgen deprivation, as well as ablative therapy options for the treatment of prostate cancer as appropriate to his individual cancer situation. We discussed the risks and benefits of these options with regard to their impact on cancer control and also  in terms of potential adverse events, complications, and impact on quality of life particularly related to urinary, bowel, and sexual function. The patient was encouraged to ask questions throughout the discussion today and all questions were answered to his stated satisfaction. In addition, the patient was provided with and/or directed to appropriate resources and literature for further education about prostate cancer treatment options.  Unfortunately, he is illiterate but does have friends/ family who can read to him.    We discussed surgical therapy for prostate cancer including the different available surgical approaches. We discussed, in detail, the risks and expectations of surgery with regard to cancer control, urinary control, and erectile dysfunction as well as expected post operative cover he processed. Additional risks of surgery including but not limalited to bleeding, infection, hernia formation, nerve damage, steel formation, bowel/rect injury, potentially necessitating colostomy, damage to the urinary tract resulting in urinary leakage, urethral stricture, and cardiopulmonary risk such as myocardial infarction, stroke, death, thromboembolism etc. were explained. The risk of open surgical conversion for robotics/laparoscopic prostatectomy is also discussed.  Specifically today, we discussed that if he underwent surgery, a wide approach would be performed on the right side with bilateral pelvic lymph node dissection with high risk of needing adjuvant or salvage therapy.  At this point, he is unsure how would like to proceed. I have recommended consultation with radiation oncology to explore his options. He is agreeable with this plan.  2. Kidney stone on left side Scheduled for [facility] [place]   Return for scheduled for surgery monday for PCNL.  Hollice Espy, MD  [facility] [address], [place], [place] [address] [phone]

## 2016-06-18 MED ORDER — CEFAZOLIN SODIUM-DEXTROSE 2-4 GM/100ML-% IV SOLN
2.0000 g | Freq: Once | INTRAVENOUS | Status: AC
Start: 2016-06-18 — End: 2016-06-19
  Administered 2016-06-19: 2 g via INTRAVENOUS

## 2016-06-19 ENCOUNTER — Ambulatory Visit: Payer: Medicare HMO

## 2016-06-19 ENCOUNTER — Ambulatory Visit: Payer: Medicare HMO | Admitting: Anesthesiology

## 2016-06-19 ENCOUNTER — Ambulatory Visit
Admission: RE | Admit: 2016-06-19 | Discharge: 2016-06-19 | Disposition: A | Discharge status: Home / Self Care | Payer: Medicare HMO | Source: Ambulatory Visit | Attending: Urology | Admitting: Urology

## 2016-06-19 ENCOUNTER — Encounter
Admission: RE | Disposition: A | Discharge status: Home / Self Care | Payer: Self-pay | Source: Ambulatory Visit | Attending: Urology

## 2016-06-19 ENCOUNTER — Observation Stay
Admission: RE | Admit: 2016-06-19 | Discharge: 2016-06-20 | Disposition: A | Discharge status: Home / Self Care | Payer: Medicare HMO | Source: Ambulatory Visit | Attending: Urology | Admitting: Urology

## 2016-06-19 ENCOUNTER — Encounter: Payer: Self-pay | Admitting: *Deleted

## 2016-06-19 DIAGNOSIS — I739 Peripheral vascular disease, unspecified: Secondary | ICD-10-CM | POA: Diagnosis not present

## 2016-06-19 DIAGNOSIS — I110 Hypertensive heart disease with heart failure: Secondary | ICD-10-CM | POA: Diagnosis not present

## 2016-06-19 DIAGNOSIS — N200 Calculus of kidney: Principal | ICD-10-CM

## 2016-06-19 DIAGNOSIS — Z87440 Personal history of urinary (tract) infections: Secondary | ICD-10-CM | POA: Diagnosis not present

## 2016-06-19 DIAGNOSIS — Z86718 Personal history of other venous thrombosis and embolism: Secondary | ICD-10-CM | POA: Diagnosis not present

## 2016-06-19 DIAGNOSIS — F17200 Nicotine dependence, unspecified, uncomplicated: Secondary | ICD-10-CM | POA: Insufficient documentation

## 2016-06-19 DIAGNOSIS — I25119 Atherosclerotic heart disease of native coronary artery with unspecified angina pectoris: Secondary | ICD-10-CM | POA: Insufficient documentation

## 2016-06-19 DIAGNOSIS — R51 Headache: Secondary | ICD-10-CM | POA: Diagnosis not present

## 2016-06-19 DIAGNOSIS — I509 Heart failure, unspecified: Secondary | ICD-10-CM | POA: Insufficient documentation

## 2016-06-19 DIAGNOSIS — I700 Atherosclerosis of aorta: Secondary | ICD-10-CM | POA: Insufficient documentation

## 2016-06-19 DIAGNOSIS — Z7901 Long term (current) use of anticoagulants: Secondary | ICD-10-CM | POA: Insufficient documentation

## 2016-06-19 DIAGNOSIS — F419 Anxiety disorder, unspecified: Secondary | ICD-10-CM | POA: Diagnosis not present

## 2016-06-19 DIAGNOSIS — I252 Old myocardial infarction: Secondary | ICD-10-CM | POA: Insufficient documentation

## 2016-06-19 DIAGNOSIS — Z87442 Personal history of urinary calculi: Secondary | ICD-10-CM | POA: Diagnosis not present

## 2016-06-19 DIAGNOSIS — E785 Hyperlipidemia, unspecified: Secondary | ICD-10-CM | POA: Diagnosis not present

## 2016-06-19 DIAGNOSIS — R0602 Shortness of breath: Secondary | ICD-10-CM | POA: Diagnosis not present

## 2016-06-19 DIAGNOSIS — C61 Malignant neoplasm of prostate: Secondary | ICD-10-CM | POA: Insufficient documentation

## 2016-06-19 DIAGNOSIS — K219 Gastro-esophageal reflux disease without esophagitis: Secondary | ICD-10-CM | POA: Diagnosis not present

## 2016-06-19 DIAGNOSIS — Z419 Encounter for procedure for purposes other than remedying health state, unspecified: Secondary | ICD-10-CM

## 2016-06-19 DIAGNOSIS — J449 Chronic obstructive pulmonary disease, unspecified: Secondary | ICD-10-CM | POA: Diagnosis not present

## 2016-06-19 HISTORY — PX: IR NEPHROSTOMY PLACEMENT LEFT: IMG6063

## 2016-06-19 HISTORY — PX: NEPHROLITHOTOMY: SHX5134

## 2016-06-19 LAB — PROTIME-INR
INR: 0.92
PROTHROMBIN TIME: 12.3 s (ref 11.4–15.2)

## 2016-06-19 LAB — ABO/RH: ABO/RH(D): B POS

## 2016-06-19 SURGERY — NEPHROLITHOTOMY PERCUTANEOUS
Anesthesia: General | Site: Back | Laterality: Left | Wound class: Clean

## 2016-06-19 MED ORDER — FENTANYL CITRATE (PF) 100 MCG/2ML IJ SOLN
INTRAMUSCULAR | Status: AC
  Administered 2016-06-19: 25 ug via INTRAVENOUS
  Filled 2016-06-19: qty 2

## 2016-06-19 MED ORDER — MIDAZOLAM HCL 5 MG/5ML IJ SOLN
INTRAMUSCULAR | Status: AC | PRN
  Administered 2016-06-19: 1 mg via INTRAVENOUS
  Administered 2016-06-19 (×3): 0.5 mg via INTRAVENOUS

## 2016-06-19 MED ORDER — RANOLAZINE ER 500 MG PO TB12
500.0000 mg | ORAL_TABLET | ORAL | Status: DC
  Administered 2016-06-20: 500 mg via ORAL
  Filled 2016-06-19: qty 1

## 2016-06-19 MED ORDER — ROCURONIUM BROMIDE 50 MG/5ML IV SOLN
INTRAVENOUS | Status: AC
  Filled 2016-06-19: qty 1

## 2016-06-19 MED ORDER — IOPAMIDOL (ISOVUE-300) INJECTION 61%
30.0000 mL | Freq: Once | INTRAVENOUS | Status: AC | PRN
  Administered 2016-06-19: 20 mL

## 2016-06-19 MED ORDER — CEFAZOLIN IN D5W 1 GM/50ML IV SOLN
1.0000 g | Freq: Three times a day (TID) | INTRAVENOUS | Status: AC
  Administered 2016-06-19 – 2016-06-20 (×2): 1 g via INTRAVENOUS
  Filled 2016-06-19 (×3): qty 50

## 2016-06-19 MED ORDER — BENAZEPRIL HCL 20 MG PO TABS
20.0000 mg | ORAL_TABLET | Freq: Every day | ORAL | Status: DC
  Administered 2016-06-20: 20 mg via ORAL
  Filled 2016-06-19: qty 1

## 2016-06-19 MED ORDER — ONDANSETRON HCL 4 MG/2ML IJ SOLN: 4.0000 mg | INTRAMUSCULAR | Status: DC | PRN

## 2016-06-19 MED ORDER — SUGAMMADEX SODIUM 200 MG/2ML IV SOLN
INTRAVENOUS | Status: DC | PRN
  Administered 2016-06-19: 200 mg via INTRAVENOUS

## 2016-06-19 MED ORDER — ONDANSETRON HCL 4 MG/2ML IJ SOLN: 4.0000 mg | Freq: Once | INTRAMUSCULAR | Status: DC | PRN

## 2016-06-19 MED ORDER — ALLOPURINOL 100 MG PO TABS
100.0000 mg | ORAL_TABLET | Freq: Every day | ORAL | Status: DC
Start: 2016-06-19 — End: 2016-06-20
  Administered 2016-06-19: 100 mg via ORAL
  Filled 2016-06-19: qty 1

## 2016-06-19 MED ORDER — FENTANYL CITRATE (PF) 100 MCG/2ML IJ SOLN
25.0000 ug | INTRAMUSCULAR | Status: DC | PRN
  Administered 2016-06-19 (×4): 25 ug via INTRAVENOUS

## 2016-06-19 MED ORDER — MORPHINE SULFATE (PF) 2 MG/ML IV SOLN: 2.0000 mg | INTRAVENOUS | Status: DC | PRN

## 2016-06-19 MED ORDER — BELLADONNA ALKALOIDS-OPIUM 16.2-60 MG RE SUPP: 1.0000 | Freq: Four times a day (QID) | RECTAL | Status: DC | PRN

## 2016-06-19 MED ORDER — LIDOCAINE HCL (PF) 1 % IJ SOLN
INTRAMUSCULAR | Status: AC
  Filled 2016-06-19: qty 30

## 2016-06-19 MED ORDER — MIDAZOLAM HCL 2 MG/2ML IJ SOLN
INTRAMUSCULAR | Status: DC | PRN
  Administered 2016-06-19: 2 mg via INTRAVENOUS

## 2016-06-19 MED ORDER — PHENYLEPHRINE HCL 10 MG/ML IJ SOLN
INTRAMUSCULAR | Status: AC
  Filled 2016-06-19: qty 1

## 2016-06-19 MED ORDER — SODIUM CHLORIDE 0.9 % IV SOLN
INTRAVENOUS | Status: DC
  Administered 2016-06-19 – 2016-06-20 (×2): via INTRAVENOUS

## 2016-06-19 MED ORDER — DIPHENHYDRAMINE HCL 50 MG/ML IJ SOLN: 12.5000 mg | Freq: Four times a day (QID) | INTRAMUSCULAR | Status: DC | PRN

## 2016-06-19 MED ORDER — ONDANSETRON HCL 4 MG/2ML IJ SOLN
INTRAMUSCULAR | Status: DC | PRN
  Administered 2016-06-19: 4 mg via INTRAVENOUS

## 2016-06-19 MED ORDER — PANTOPRAZOLE SODIUM 40 MG PO TBEC
40.0000 mg | DELAYED_RELEASE_TABLET | Freq: Every day | ORAL | Status: DC
  Administered 2016-06-20: 40 mg via ORAL
  Filled 2016-06-19: qty 1

## 2016-06-19 MED ORDER — PHENYLEPHRINE HCL 10 MG/ML IJ SOLN
INTRAMUSCULAR | Status: DC | PRN
  Administered 2016-06-19: 100 ug via INTRAVENOUS
  Administered 2016-06-19 (×2): 150 ug via INTRAVENOUS
  Administered 2016-06-19: 100 ug via INTRAVENOUS
  Administered 2016-06-19: 200 ug via INTRAVENOUS
  Administered 2016-06-19 (×2): 150 ug via INTRAVENOUS

## 2016-06-19 MED ORDER — FENTANYL CITRATE (PF) 100 MCG/2ML IJ SOLN
INTRAMUSCULAR | Status: AC
  Filled 2016-06-19: qty 4

## 2016-06-19 MED ORDER — DEXAMETHASONE SODIUM PHOSPHATE 10 MG/ML IJ SOLN
INTRAMUSCULAR | Status: AC
  Filled 2016-06-19: qty 1

## 2016-06-19 MED ORDER — CEFAZOLIN SODIUM-DEXTROSE 2-4 GM/100ML-% IV SOLN
INTRAVENOUS | Status: AC
  Filled 2016-06-19: qty 100

## 2016-06-19 MED ORDER — PROPOFOL 10 MG/ML IV BOLUS
INTRAVENOUS | Status: AC
  Filled 2016-06-19: qty 20

## 2016-06-19 MED ORDER — MIDAZOLAM HCL 2 MG/2ML IJ SOLN
INTRAMUSCULAR | Status: AC
  Filled 2016-06-19: qty 2

## 2016-06-19 MED ORDER — DIPHENHYDRAMINE HCL 12.5 MG/5ML PO ELIX: 12.5000 mg | ORAL_SOLUTION | Freq: Four times a day (QID) | ORAL | Status: DC | PRN

## 2016-06-19 MED ORDER — FENTANYL CITRATE (PF) 100 MCG/2ML IJ SOLN
INTRAMUSCULAR | Status: AC | PRN
  Administered 2016-06-19: 50 ug via INTRAVENOUS
  Administered 2016-06-19 (×2): 12.5 ug via INTRAVENOUS

## 2016-06-19 MED ORDER — SODIUM CHLORIDE 0.9 % IV BOLUS (SEPSIS)
500.0000 mL | Freq: Once | INTRAVENOUS | Status: AC
  Administered 2016-06-19: 500 mL via INTRAVENOUS

## 2016-06-19 MED ORDER — FENTANYL CITRATE (PF) 100 MCG/2ML IJ SOLN
INTRAMUSCULAR | Status: DC | PRN
  Administered 2016-06-19: 100 ug via INTRAVENOUS

## 2016-06-19 MED ORDER — RISAQUAD PO CAPS
ORAL_CAPSULE | Freq: Every day | ORAL | Status: DC
  Administered 2016-06-19: 1 via ORAL
  Filled 2016-06-19: qty 1

## 2016-06-19 MED ORDER — FENTANYL CITRATE (PF) 100 MCG/2ML IJ SOLN
INTRAMUSCULAR | Status: AC
  Filled 2016-06-19: qty 2

## 2016-06-19 MED ORDER — ALPRAZOLAM 0.25 MG PO TABS: 0.2500 mg | ORAL_TABLET | Freq: Every day | ORAL | Status: DC | PRN

## 2016-06-19 MED ORDER — ACETAMINOPHEN 325 MG PO TABS: 650.0000 mg | ORAL_TABLET | ORAL | Status: DC | PRN

## 2016-06-19 MED ORDER — LACTATED RINGERS IV SOLN
INTRAVENOUS | Status: DC
  Administered 2016-06-19 (×2): via INTRAVENOUS

## 2016-06-19 MED ORDER — OXYBUTYNIN CHLORIDE 5 MG PO TABS: 5.0000 mg | ORAL_TABLET | Freq: Three times a day (TID) | ORAL | Status: DC | PRN

## 2016-06-19 MED ORDER — MIDAZOLAM HCL 5 MG/5ML IJ SOLN
INTRAMUSCULAR | Status: AC
  Filled 2016-06-19: qty 10

## 2016-06-19 MED ORDER — DEXAMETHASONE SODIUM PHOSPHATE 10 MG/ML IJ SOLN
INTRAMUSCULAR | Status: DC | PRN
  Administered 2016-06-19: 10 mg via INTRAVENOUS

## 2016-06-19 MED ORDER — EPHEDRINE SULFATE 50 MG/ML IJ SOLN
INTRAMUSCULAR | Status: AC
  Filled 2016-06-19: qty 1

## 2016-06-19 MED ORDER — EPHEDRINE SULFATE 50 MG/ML IJ SOLN
INTRAMUSCULAR | Status: DC | PRN
  Administered 2016-06-19 (×3): 10 mg via INTRAVENOUS

## 2016-06-19 MED ORDER — DOCUSATE SODIUM 100 MG PO CAPS: 100.0000 mg | ORAL_CAPSULE | Freq: Every day | ORAL | Status: DC | PRN

## 2016-06-19 MED ORDER — ROCURONIUM BROMIDE 100 MG/10ML IV SOLN
INTRAVENOUS | Status: DC | PRN
  Administered 2016-06-19: 20 mg via INTRAVENOUS
  Administered 2016-06-19: 5 mg via INTRAVENOUS
  Administered 2016-06-19: 30 mg via INTRAVENOUS
  Administered 2016-06-19: 10 mg via INTRAVENOUS

## 2016-06-19 MED ORDER — SUGAMMADEX SODIUM 200 MG/2ML IV SOLN
INTRAVENOUS | Status: AC
  Filled 2016-06-19: qty 2

## 2016-06-19 MED ORDER — ONDANSETRON HCL 4 MG/2ML IJ SOLN
INTRAMUSCULAR | Status: AC
  Filled 2016-06-19: qty 2

## 2016-06-19 MED ORDER — BENAZEPRIL-HYDROCHLOROTHIAZIDE 20-12.5 MG PO TABS: 1.0000 | ORAL_TABLET | Freq: Every day | ORAL | Status: DC

## 2016-06-19 MED ORDER — IOTHALAMATE MEGLUMINE 43 % IV SOLN
INTRAVENOUS | Status: DC | PRN
  Administered 2016-06-19: 40 mL

## 2016-06-19 MED ORDER — ATORVASTATIN CALCIUM 20 MG PO TABS
80.0000 mg | ORAL_TABLET | Freq: Every day | ORAL | Status: DC
  Administered 2016-06-19: 80 mg via ORAL
  Filled 2016-06-19: qty 4

## 2016-06-19 MED ORDER — OXYCODONE-ACETAMINOPHEN 5-325 MG PO TABS
1.0000 | ORAL_TABLET | ORAL | Status: DC | PRN
  Administered 2016-06-19 (×2): 1 via ORAL
  Administered 2016-06-20: 2 via ORAL
  Filled 2016-06-19: qty 2
  Filled 2016-06-19 (×2): qty 1

## 2016-06-19 MED ORDER — PROPOFOL 10 MG/ML IV BOLUS
INTRAVENOUS | Status: DC | PRN
  Administered 2016-06-19: 140 mg via INTRAVENOUS

## 2016-06-19 MED ORDER — HYDROXYZINE HCL 25 MG PO TABS: 25.0000 mg | ORAL_TABLET | Freq: Every evening | ORAL | Status: DC | PRN

## 2016-06-19 MED ORDER — SODIUM CHLORIDE 0.9 % IV SOLN
INTRAVENOUS | Status: DC | PRN
  Administered 2016-06-19: 40 ug/min via INTRAVENOUS

## 2016-06-19 MED ORDER — HYDROCHLOROTHIAZIDE 12.5 MG PO CAPS
12.5000 mg | ORAL_CAPSULE | Freq: Every day | ORAL | Status: DC
  Administered 2016-06-20: 12.5 mg via ORAL
  Filled 2016-06-19: qty 1

## 2016-06-19 MED ORDER — HEPARIN SODIUM (PORCINE) 5000 UNIT/ML IJ SOLN
5000.0000 [IU] | Freq: Three times a day (TID) | INTRAMUSCULAR | Status: DC
  Administered 2016-06-19 – 2016-06-20 (×3): 5000 [IU] via SUBCUTANEOUS
  Filled 2016-06-19 (×3): qty 1

## 2016-06-19 MED ORDER — FUROSEMIDE 20 MG PO TABS
20.0000 mg | ORAL_TABLET | Freq: Every day | ORAL | Status: DC
  Administered 2016-06-19 – 2016-06-20 (×2): 20 mg via ORAL
  Filled 2016-06-19 (×2): qty 1

## 2016-06-19 MED ORDER — TRAZODONE HCL 100 MG PO TABS
100.0000 mg | ORAL_TABLET | Freq: Every day | ORAL | Status: DC
  Administered 2016-06-19: 100 mg via ORAL
  Filled 2016-06-19: qty 1

## 2016-06-19 MED ORDER — LORATADINE 10 MG PO TABS
10.0000 mg | ORAL_TABLET | Freq: Every day | ORAL | Status: DC
  Administered 2016-06-19: 10 mg via ORAL
  Filled 2016-06-19: qty 1

## 2016-06-19 MED ORDER — PNEUMOCOCCAL VAC POLYVALENT 25 MCG/0.5ML IJ INJ: 0.5000 mL | INJECTION | INTRAMUSCULAR | Status: DC

## 2016-06-19 MED ORDER — LIDOCAINE HCL (PF) 1 % IJ SOLN
INTRAMUSCULAR | Status: AC | PRN
  Administered 2016-06-19: 5 mL

## 2016-06-19 MED ORDER — METOPROLOL SUCCINATE ER 50 MG PO TB24
50.0000 mg | ORAL_TABLET | Freq: Every day | ORAL | Status: DC
  Filled 2016-06-19: qty 1

## 2016-06-19 MED ORDER — SUCCINYLCHOLINE CHLORIDE 20 MG/ML IJ SOLN
INTRAMUSCULAR | Status: AC
  Filled 2016-06-19: qty 1

## 2016-06-19 MED ORDER — FLUTICASONE PROPIONATE 50 MCG/ACT NA SUSP
1.0000 | Freq: Every day | NASAL | Status: DC
  Filled 2016-06-19: qty 16

## 2016-06-19 SURGICAL SUPPLY — 69 items
ADAPTER IRRIG TUBE 2 SPIKE SOL (ADAPTER) ×8 IMPLANT
ADAPTER SCOPE UROLOK II (MISCELLANEOUS) ×4 IMPLANT
ADPR INSRT BALL FIT URLK2 (MISCELLANEOUS) ×2
ADPR TBG 2 SPK PMP STRL ASCP (ADAPTER) ×4
BAG URO DRAIN 2000ML W/SPOUT (MISCELLANEOUS) ×4 IMPLANT
BALLN NEPHROMAX 30X10X12 (MISCELLANEOUS) ×4
BALLOON NEPHROMAX 30X10X12 (MISCELLANEOUS) ×2 IMPLANT
BASKET ZERO TIP 1.9FR (BASKET) ×1 IMPLANT
BLADE SURG 15 STRL LF DISP TIS (BLADE) ×2 IMPLANT
BLADE SURG 15 STRL SS (BLADE) ×4
BSKT STON RTRVL ZERO TP 1.9FR (BASKET)
CATH COUNCIL 22FR (CATHETERS) ×1 IMPLANT
CATH FOLEY 2W COUNCIL 20FR 5CC (CATHETERS) ×1 IMPLANT
CATH FOLEY 2W COUNCIL 5CC 18FR (CATHETERS) ×4 IMPLANT
CATH STENT KAYE NEPHR TAMP (CATHETERS) ×1 IMPLANT
CATH TRAY 16F METER LATEX (MISCELLANEOUS) ×4 IMPLANT
CATH URETL 5X70 OPEN END (CATHETERS) ×8 IMPLANT
CATH/STENT KAYE NEPHR TAMP (CATHETERS)
CHLORAPREP W/TINT 26ML (MISCELLANEOUS) ×4 IMPLANT
CNTNR SPEC 2.5X3XGRAD LEK (MISCELLANEOUS) ×4
CONRAY 43 FOR UROLOGY 50M (MISCELLANEOUS) ×16 IMPLANT
CONT SPEC 4OZ STER OR WHT (MISCELLANEOUS) ×4
CONT SPEC 4OZ STRL OR WHT (MISCELLANEOUS) ×4
CONTAINER SPEC 2.5X3XGRAD LEK (MISCELLANEOUS) ×3 IMPLANT
DRAPE C-ARM XRAY 36X54 (DRAPES) ×4 IMPLANT
DRAPE SHEET LG 3/4 BI-LAMINATE (DRAPES) ×4 IMPLANT
DRAPE SURG 17X11 SM STRL (DRAPES) ×16 IMPLANT
GAUZE SPONGE 4X4 12PLY STRL (GAUZE/BANDAGES/DRESSINGS) ×4 IMPLANT
GLIDEWIRE STIFF .35X180X3 HYDR (WIRE) ×1 IMPLANT
GLOVE BIO SURGEON STRL SZ 6.5 (GLOVE) ×6 IMPLANT
GLOVE BIO SURGEON STRL SZ7 (GLOVE) ×8 IMPLANT
GLOVE BIO SURGEONS STRL SZ 6.5 (GLOVE) ×2
GLOVE BIOGEL PI IND STRL 6.5 (GLOVE) ×4 IMPLANT
GLOVE BIOGEL PI INDICATOR 6.5 (GLOVE) ×4
GOWN STRL REUS W/ TWL LRG LVL3 (GOWN DISPOSABLE) ×4 IMPLANT
GOWN STRL REUS W/TWL LRG LVL3 (GOWN DISPOSABLE) ×8
GUIDEWIRE GREEN .038 145CM (MISCELLANEOUS) ×1 IMPLANT
GUIDEWIRE INTRO SET STRAIGHT (WIRE) ×4 IMPLANT
GUIDEWIRE STR ZIPWIRE 035X150 (MISCELLANEOUS) ×1 IMPLANT
GUIDEWIRE SUPER STIFF .035X180 (WIRE) IMPLANT
HOLDER FOLEY CATH W/STRAP (MISCELLANEOUS) ×4 IMPLANT
INTRODUCER DILATOR DOUBLE (INTRODUCER) ×4 IMPLANT
MANIFOLD NEPTUNE II (INSTRUMENTS) ×4 IMPLANT
MAT BLUE FLOOR 46X72 FLO (MISCELLANEOUS) ×6 IMPLANT
NDL FASCIA INCISION 18GA (NEEDLE) ×4 IMPLANT
PACK BASIN MINOR ARMC (MISCELLANEOUS) ×4 IMPLANT
PAD ABD DERMACEA PRESS 5X9 (GAUZE/BANDAGES/DRESSINGS) ×4 IMPLANT
PROBE CYBERWAND SET (MISCELLANEOUS) ×5 IMPLANT
SENSORWIRE 0.038 NOT ANGLED (WIRE) ×4
SET IRRIG Y TYPE TUR BLADDER L (SET/KITS/TRAYS/PACK) ×4 IMPLANT
SET IRRIGATING DISP (SET/KITS/TRAYS/PACK) ×4 IMPLANT
SHEET NEURO XL SOL CTL (MISCELLANEOUS) ×4 IMPLANT
SOL .9 NS 3000ML IRR  AL (IV SOLUTION) ×16
SOL .9 NS 3000ML IRR AL (IV SOLUTION) ×16
SOL .9 NS 3000ML IRR UROMATIC (IV SOLUTION) ×14 IMPLANT
SPONGE DRAIN TRACH 4X4 STRL 2S (GAUZE/BANDAGES/DRESSINGS) ×4 IMPLANT
STENT URET 6FRX24 CONTOUR (STENTS) IMPLANT
STENT URET 6FRX26 CONTOUR (STENTS) ×1 IMPLANT
SUT SILK 0 SH 30 (SUTURE) ×4 IMPLANT
SYR 20CC LL (SYRINGE) ×4 IMPLANT
SYR 30ML LL (SYRINGE) ×4 IMPLANT
SYRINGE 10CC LL (SYRINGE) ×4 IMPLANT
SYRINGE IRR TOOMEY STRL 70CC (SYRINGE) ×4 IMPLANT
TAPE MICROFOAM 4IN (TAPE) ×4 IMPLANT
TRAP SPECIMEN MUCOUS 40CC (MISCELLANEOUS) ×1 IMPLANT
TUBING CONNECTING 10 (TUBING) ×3 IMPLANT
TUBING CONNECTING 10' (TUBING) ×1
WATER STERILE IRR 1000ML POUR (IV SOLUTION) ×4 IMPLANT
WIRE SENSOR 0.038 NOT ANGLED (WIRE) ×3 IMPLANT

## 2016-06-19 NOTE — Progress Notes (Signed)
2210: Dr. Erlene Quan notified of hypotension; Acknowledged; "Hold Toprol XL and give 557ml NS bolus over two hours; call me back if BP not improved or pulse increases". Barbaraann Faster, RN 11:28 PM 06/19/2016

## 2016-06-19 NOTE — Progress Notes (Signed)
Dr. Erlene Quan paged for hypotension: 88/44 manually; patient asymptomatic; Pulse 84; awaiting callback. Barbaraann Faster, RN 10:05 PM 06/19/2016

## 2016-06-19 NOTE — Interval H&P Note (Signed)
History and Physical Interval Note:  06/19/2016 7:27 AM  Bruce [name]  has presented today for surgery, with the diagnosis of LEFT RENAL STONE  The various methods of treatment have been discussed with the patient and family. After consideration of risks, benefits and other options for treatment, the patient has consented to  Procedure(s): NEPHROLITHOTOMY PERCUTANEOUS (Left) HOLMIUM LASER APPLICATION (N/A) as a surgical intervention .  The patient's history has been reviewed, patient examined, no change in status, stable for surgery.  I have reviewed the patient's chart and labs.  Questions were answered to the patient's satisfaction.    RRR CTAB   Hollice Espy

## 2016-06-19 NOTE — H&P (Signed)
Chief Complaint:  Left Stack on calculus, preoperative nephrostomy access   Referring Physician(s): Brandon,Ashley    History of Present Illness: Bruce [name] is a 71 y.o. male with a chronic segmental mid to lower pole left Stack white count was. He presents today for reoperative nephrolithotomy access. Prior history of CHF, coronary disease, COPD, gastroesophageal reflux. No current chest pain, shortness of breath, abdominal pain, flank pain, dysuria, fever, or hematuria.  Past Medical History:  Diagnosis Date  . Anginal pain ([place])    Left side if chest ,NTG  relieves chaes apin 12/01/13  . Anxiety   . Arthritis   . CHF (congestive heart failure) ([place])   . 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 Complication of anesthesia    wake up with a head ache  . COPD (chronic obstructive pulmonary disease) ([place])   . Coronary artery disease   . GERD (gastroesophageal reflux disease)   . Headache    related to sinus congestion  . History of blood transfusion   . History of kidney stones   . Hypertension   . Myocardial infarction [facility])    '09 AND '12  . Peripheral vascular disease ([place])   . Shortness of breath    With exertion .  Marland Kitchen UTI (urinary tract infection)    frequent UTI    Past Surgical History:  Procedure Laterality Date  . CARDIAC CATHETERIZATION    . CHOLECYSTECTOMY    . EXTERNAL FIXATION LEG Right 10/06/2013   Procedure: CLOSED REDUCTION RIGHT TIBIAL PLATEAU FRACTURE, EXTERNAL FIXATION RIGHT LEG, PLACEMENT OF WOUND VAC;  Surgeon: Rozanna Box, MD;  Location: [facility];  Service: Orthopedics;  Laterality: Right;  . FEMORAL-POPLITEAL BYPASS GRAFT Right 10/06/2013   Procedure: RIGHT POPLITEAL-POPLITEAL ARTERY BYPASS GRAFT;  Surgeon: Rosetta Posner, MD;  Location: [place] [facility];  Service: Vascular;  Laterality: Right;  . FRACTURE SURGERY Right 2014  . HARDWARE REMOVAL Right 12/02/2013   Procedure: REMOVAL EXTERNAL FIXATION RIGHT LEG  ;  Surgeon: Rozanna Box, MD;  Location: [facility];  Service: Orthopedics;  Laterality: Right;  . HERNIA REPAIR Right 1990's  . I&D EXTREMITY Right 10/09/2013   Procedure: IRRIGATION AND DEBRIDEMENT RIGHT LEG, CLOSURE  OF WOUNDS, PLACEMENT OF WOUND VAC ON EACH SIDE OF LEG;  Surgeon: Rozanna Box, MD;  Location: [facility];  Service: Orthopedics;  Laterality: Right;  . IVC filter  2009   placed @ [facility]/ Removed in 2010.  . IVC Filter Removed    . ligament leg Left   . ORIF TIBIA FRACTURE Right 12/29/2014   Procedure: OPEN REDUCTION INTERNAL FIXATION (ORIF) RIGHT TIBIA FRACTURE, RIA VS ICBG;  Surgeon: Altamese [place], MD;  Location: [place];  Service: Orthopedics;  Laterality: Right;    Allergies: Adhesive [tape]; Imdur [isosorbide dinitrate]; and Singulair [montelukast sodium]  Medications: Prior to Admission medications   Medication Sig Start Date End Date Taking? Authorizing Provider  allopurinol (ZYLOPRIM) 100 MG tablet Take 100 mg by mouth at bedtime.    Historical Provider, MD  ALPRAZolam Duanne Moron) 0.25 MG tablet Take 1 tablet (0.25 mg total) by mouth daily as needed for anxiety. 04/11/16   Rainey Pines, MD  atorvastatin (LIPITOR) 80 MG tablet Take 80 mg by mouth at bedtime.     Historical Provider, MD  benazepril-hydrochlorthiazide (LOTENSIN HCT) 20-12.5 MG per tablet Take 1 tablet by mouth daily.    Historical Provider, MD  CALCIUM CITRATE PO Take 250 mg by  mouth at bedtime.     Historical Provider, MD  Cholecalciferol (VITAMIN D3 PO) Take 1,000 tablets by mouth daily.     Historical Provider, MD  DOCOSAHEXAENOIC ACID PO Take by mouth.    Historical Provider, MD  docusate sodium (COLACE) 100 MG capsule Take 100 mg by mouth daily as needed for mild constipation.    Historical Provider, MD  fluticasone (FLONASE) 50 MCG/ACT nasal spray Place 1 spray into both nostrils daily.    Historical Provider, MD  furosemide (LASIX) 20 MG tablet Take 20 mg by mouth daily.    Historical Provider, MD  hydrOXYzine  (ATARAX/VISTARIL) 25 MG tablet Take 25 mg by mouth at bedtime as needed. sleep    Historical Provider, MD  loratadine (CLARITIN) 10 MG tablet Take 10 mg by mouth at bedtime.     Historical Provider, MD  metoprolol succinate (TOPROL-XL) 50 MG 24 hr tablet Take 1 tablet (50 mg total) by mouth daily. Patient taking differently: Take 50 mg by mouth at bedtime.  04/12/11   Jackolyn Confer, MD  Multiple Vitamin (MULTIVITAMIN) tablet Take 1 tablet by mouth daily. For Men    Historical Provider, MD  nitroGLYCERIN (NITROSTAT) 0.4 MG SL tablet Place 0.4 mg under the tongue every 5 (five) minutes as needed for chest pain.     Historical Provider, MD  Omega-3 Fatty Acids (FISH OIL PO) Take 1 capsule by mouth daily.    Historical Provider, MD  omeprazole (PRILOSEC) 40 MG capsule Take 40 mg by mouth daily.    Historical Provider, MD  Probiotic Product (PROBIOTIC FORMULA PO) Take 1 Dose by mouth at bedtime.    Historical Provider, MD  ranolazine (RANEXA) 1000 MG SR tablet Take 500 mg by mouth every morning.    Historical Provider, MD  rivaroxaban (XARELTO) 20 MG TABS tablet Take 20 mg by mouth daily with supper.    Historical Provider, MD  traMADol (ULTRAM) 50 MG tablet Take 50 mg by mouth 2 (two) times daily.    Historical Provider, MD  traZODone (DESYREL) 100 MG tablet Take 1 tablet (100 mg total) by mouth at bedtime. 04/11/16   Rainey Pines, MD     Family History  Problem Relation Age of Onset  . Prostate cancer Brother     Social History   Social History  . Marital status: Single    Spouse name: N/A  . Number of children: N/A  . Years of education: N/A   Social History Main Topics  . Smoking status: Current Some Day Smoker    Packs/day: 0.50    Years: 52.00  . Smokeless tobacco: Never Used  . Alcohol use No     Comment: Stopped 2009  . Drug use: No  . Sexual activity: Not Currently   Other Topics Concern  . Not on file   Social History Narrative   ** Merged History Encounter **          Review of Systems: A 12 point ROS discussed and pertinent positives are indicated in the HPI above.  All other systems are negative.  Review of Systems  Vital Signs: BP 126/82   Pulse 65   Temp 97.9 F (36.6 C)   Resp 14   SpO2 95%   Physical Exam  Constitutional: He is oriented to person, place, and time. He appears well-developed and well-nourished. No distress.  Eyes: Conjunctivae are normal. No scleral icterus.  Cardiovascular: Normal rate and regular rhythm.   No murmur heard. Pulmonary/Chest: Effort normal and  breath sounds normal. No respiratory distress.  Abdominal: Soft. Bowel sounds are normal. He exhibits no mass. There is no tenderness.  Musculoskeletal: Normal range of motion. He exhibits no edema.  Neurological: He is alert and oriented to person, place, and time.  Skin: Skin is warm and dry. He is not diaphoretic.  Psychiatric: He has a normal mood and affect. His behavior is normal.    Mallampati Score:   2  Imaging: No results found.  Labs:  CBC:  Recent Labs  06/12/16 0902  WBC 7.2  HGB 15.0  HCT 44.2  PLT 242    COAGS:  Recent Labs  06/12/16 0902 06/19/16 0649  INR 1.74 0.92  APTT 38*  --     BMP:  Recent Labs  05/09/16 0837 06/12/16 0902  NA  --  138  K  --  4.4  CL  --  101  CO2  --  31  GLUCOSE  --  131*  BUN  --  11  CALCIUM  --  9.9  CREATININE 1.10 1.32*  GFRNONAA  --  45*  GFRAA  --  >60      Assessment and Plan:  Left staghorn calculus, plan for preoperative nephrostomy access for nephrolithotomy. The procedure, risks, benefits and alternatives all reviewed. All questions addressed. Patient understands the procedure. Consent obtained.  Risks and Benefits discussed with the patient including, but not limited to infection, bleeding, significant bleeding causing loss or decrease in renal function or damage to adjacent structures.  All of the patient's questions were answered, patient is agreeable to  proceed. Consent signed and in chart.    Thank you for this interesting consult.  I greatly enjoyed meeting JC VERON and look forward to participating in their care.  A copy of this report was sent to the requesting provider on this date.  Electronically Signed: Greggory Keen 06/19/2016, 8:07 AM   I spent a total of  30 Minutes   in face to face in clinical consultation, greater than 50% of which was counseling/coordinating care for this patient with a left staghorn calculus.

## 2016-06-19 NOTE — OR Nursing (Signed)
Patient back from special procedure department.

## 2016-06-19 NOTE — Anesthesia Post-op Follow-up Note (Cosign Needed)
Anesthesia QCDR form completed.        

## 2016-06-19 NOTE — Brief Op Note (Signed)
06/19/2016  3:47 PM  PATIENT:  [name]  71 y.o. male  PRE-OPERATIVE DIAGNOSIS:  LEFT RENAL STONE  POST-OPERATIVE DIAGNOSIS:  left renal stone  PROCEDURE:  Procedure(s): NEPHROLITHOTOMY PERCUTANEOUS (Left)  SURGEON:  Surgeon(s) and Role:    * Hollice Espy, MD - Primary  ASSISTANTS: none   ANESTHESIA:   general  EBL:  Total I/O In: -  Out: 10 [Blood:10]  Drains: 16 Fr Foley and 18 Fr Foley as nephrostomy, 5 Fr open ended ureteral stent  Specimen: stone fragment  COUNTS CORRECT: YES  PLAN OF CARE: Admit for overnight observation  PATIENT DISPOSITION:  PACU - hemodynamically stable.

## 2016-06-19 NOTE — Op Note (Signed)
Date of procedure: 06/19/16  Preoperative diagnosis:  1. Left partial staghorn calculus   Postoperative diagnosis:  1. Same as above   Procedure: 1. Left percutaneous nephrolithotomy 2. Left antegrade nephrostogram 3. Left nephrostomy tube placement 4. Interpretation of fluoroscopy less than 30 minutes  Surgeon: Hollice Espy, MD  Anesthesia: General  Complications: None  Intraoperative findings: Calculus identified in left mid pole calyx extending into the renal pelvis, branching. Stone free at the end of the procedure.  EBL: 100 cc  Specimens: Stone fragment  Drains: 21 French Foley catheter per urethra, 51 French Foley catheter as left nephrostomy tube, 5 French open-ended ureteral catheter  Indication: Bruce [name] is a 71 y.o. patient with with a left partial staghorn calculus.  After reviewing the management options for treatment, he elected to proceed with the above surgical procedure(s). We have discussed the potential benefits and risks of the procedure, side effects of the proposed treatment, the likelihood of the patient achieving the goals of the procedure, and any potential problems that might occur during the procedure or recuperation. Informed consent has been obtained.  He was taken to interventional radiology where a left percutaneous nephroureteral catheter was placed down to the level of the bladder with mid pole access. These images were reviewed prior to the procedure today.  Description of procedure:  The patient was taken to the operating room and general anesthesia was induced on the stretcher.  A 16 French Foley catheter was placed using standard sterile fashion. He was then repositioned in the prone position on the operating room table with care taken to pad all pressure points. He was strapped to the table securely. He was then prepped and draped in the standard surgical fashion including prepping the left nephroureteral catheter into the field. A  timeout was then performed and antibiotics were given.  Scott imaging showed the left nephroureteral catheter with mid pole access down to the level of the bladder. The stone could be seen occupying 2 of the branching midpole calyces extending into the renal pelvis. An antegrade nephrostogram was performed through the nephroureteral catheter to confirm its location within the collecting system and in the bladder. The catheter was then accessed using a superstiff wire down to the level of the bladder under fluoroscopic guidance. A safety wire introducer was then used to the level of the UPJ to introduce a sensor wire down to level of the bladder as well. This was used as a Chiropodist and the superstiff the working wire. The fascial incisor needle was then used to incise the fascia. A NephroMax balloon dilator kit was then used to balloon dilate the tract to the level of the stone. This was allowed to remain in place was inflated to 18 atm of pressure was setting up the nephroscope. Sheath was advanced over the balloon into the appropriate position. The balloon was then deflated and removed. There is no significant bleeding appreciated. The scope was then introduced into the collecting system were normal mucosa as well as the stone was encountered immediately. A cyber wand was then used to fragment the stone into several larger pieces. Prone a forceps were then used to extract each of the stone fragments. Once the primary stone extending into the renal pelvis was removed, a second branching midpole calyceal stone was still seen on fluoroscopy. A flexible cystoscope was used in antegrade fashion to scope the remainder of the collecting system. An antegrade nephrostogram was shot through the cystoscope showing no extravasation outlining the  collecting system. The entrance into the other midpole calyx was identified and ultimately the stone was encountered. A 1.9 French to plus nitinol basket was then used to extract  each of these stone pieces and were removed. Finally, at this point the patient was fluoroscopically free of all stone material and no additional stone could be seen cystoscopically.   Near the end of the procedure, an 32 [place] council tip was inserted into the renal pelvis and the balloon was filled with proximal 1.5 cc of diluted contrast material. The nephrostomy tube was then removed. A final antegrade nephrostogram was performed showing excellent position of the nephrostomy tube and no extravasation. No filling defects were identified. Finally, a 5 [place] open-ended ureteral catheter was advanced over the safety wire down to the level of the distal ureter. The wire was then removed. Each of these 2 drains were then sutured in place using drain stitch 2. The patient was then cleaned and dried, dressing of 4 x 4's, an AVD pad, and foam tape was applied to the wound. The patient was then repositioned the supine position, reversed from anesthesia and debated. He was taken the PACU in stable condition. There were no complications in this case.  Plan: Patient will be admitted overnight. We'll clamp his nephrostomy tube in the morning and remove his drains ideally prior to discharge.   Hollice Espy, M.D.

## 2016-06-19 NOTE — Anesthesia Preprocedure Evaluation (Addendum)
Anesthesia Evaluation  Patient identified by MRN, date of birth, ID band Patient awake    Reviewed: Allergy & Precautions, H&P , NPO status , Patient's Chart, lab work & pertinent test results, reviewed documented beta blocker date and time   History of Anesthesia Complications (+) history of anesthetic complications  Airway Mallampati: II  TM Distance: >3 FB Neck ROM: full    Dental  (+) Teeth Intact, Missing   Pulmonary neg pulmonary ROS, shortness of breath, COPD, Current Smoker,    Pulmonary exam normal        Cardiovascular hypertension, + angina + CAD, + Past MI, + Peripheral Vascular Disease and +CHF  negative cardio ROS Normal cardiovascular exam Rhythm:regular Rate:Normal     Neuro/Psych  Headaches, PSYCHIATRIC DISORDERS negative neurological ROS  negative psych ROS   GI/Hepatic negative GI ROS, Neg liver ROS, GERD  ,  Endo/Other  negative endocrine ROS  Renal/GU negative Renal ROS  negative genitourinary   Musculoskeletal   Abdominal   Peds  Hematology negative hematology ROS (+) anemia ,   Anesthesia Other Findings Past Medical History: No date: Anginal pain (HCC)     Comment: Left side if chest ,NTG  relieves chaes apin               12/01/13 No date: Anxiety No date: Arthritis No date: CHF (congestive heart failure) ([place]) 01/01/2015: Closed fracture of lateral portion of right ti* No date: Complication of anesthesia     Comment: wake up with a head ache No date: COPD (chronic obstructive pulmonary disease) (* No date: Coronary artery disease No date: GERD (gastroesophageal reflux disease) No date: Headache     Comment: related to sinus congestion No date: History of blood transfusion No date: History of kidney stones No date: Hypertension No date: Myocardial infarction [facility])     Comment: '09 AND '12 No date: Peripheral vascular disease ([place]) No date: Shortness of breath     Comment: With  exertion . No date: UTI (urinary tract infection)     Comment: frequent UTI Past Surgical History: No date: CARDIAC CATHETERIZATION No date: CHOLECYSTECTOMY 10/06/2013: EXTERNAL FIXATION LEG Right     Comment: Procedure: CLOSED REDUCTION RIGHT TIBIAL               PLATEAU FRACTURE, EXTERNAL FIXATION RIGHT LEG,               PLACEMENT OF WOUND VAC;  Surgeon: Rozanna Box, MD;  Location: [place];  Service:               Orthopedics;  Laterality: Right; 10/06/2013: FEMORAL-POPLITEAL BYPASS GRAFT Right     Comment: Procedure: RIGHT POPLITEAL-POPLITEAL ARTERY               BYPASS GRAFT;  Surgeon: Rosetta Posner, MD;                Location: [place];  Service: Vascular;                Laterality: Right; 2014: FRACTURE SURGERY Right 12/02/2013: HARDWARE REMOVAL Right     Comment: Procedure: REMOVAL EXTERNAL FIXATION RIGHT LEG              ;  Surgeon: Rozanna Box, MD;  Location: [place];  Service: Orthopedics;  Laterality: Right; 1990's: HERNIA REPAIR  Right 10/09/2013: I&D EXTREMITY Right     Comment: Procedure: IRRIGATION AND DEBRIDEMENT RIGHT               LEG, CLOSURE  OF WOUNDS, PLACEMENT OF WOUND VAC              ON EACH SIDE OF LEG;  Surgeon: Rozanna Box,              MD;  Location: [place];  Service: Orthopedics;                Laterality: Right; 2009: IVC filter     Comment: placed @ [facility]/ Removed in 2010. No date: IVC Filter Removed No date: ligament leg Left 12/29/2014: ORIF TIBIA FRACTURE Right     Comment: Procedure: OPEN REDUCTION INTERNAL FIXATION               (ORIF) RIGHT TIBIA FRACTURE, RIA VS [facility];                Surgeon: [place]e [place], MD;  Location: [place];                Service: Orthopedics;  Laterality: Right; BMI    Body Mass Index:  27.84 kg/m     Reproductive/Obstetrics negative OB ROS                             Anesthesia Physical Anesthesia Plan  ASA: III  Anesthesia Plan: General ETT   Post-op Pain  Management:    Induction:   Airway Management Planned:   Additional Equipment:   Intra-op Plan:   Post-operative Plan:   Informed Consent: I have reviewed the patients History and Physical, chart, labs and discussed the procedure including the risks, benefits and alternatives for the proposed anesthesia with the patient or authorized representative who has indicated his/her understanding and acceptance.   Dental Advisory Given  Plan Discussed with: CRNA  Anesthesia Plan Comments:         Anesthesia Quick Evaluation

## 2016-06-19 NOTE — Anesthesia Procedure Notes (Signed)
Procedure Name: Intubation Date/Time: 06/19/2016 1:24 PM Performed by: Jonna Clark Pre-anesthesia Checklist: Patient identified, Patient being monitored, Timeout performed, Emergency Drugs available and Suction available Patient Re-evaluated:Patient Re-evaluated prior to inductionOxygen Delivery Method: Circle system utilized Preoxygenation: Pre-oxygenation with 100% oxygen Intubation Type: IV induction Ventilation: Mask ventilation without difficulty Laryngoscope Size: Mac and 3 Grade View: Grade I Tube type: Oral Tube size: 7.5 mm Number of attempts: 2 Airway Equipment and Method: Stylet Placement Confirmation: ETT inserted through vocal cords under direct vision,  positive ETCO2 and breath sounds checked- equal and bilateral Secured at: 21 cm Tube secured with: Tape Dental Injury: Teeth and Oropharynx as per pre-operative assessment

## 2016-06-19 NOTE — Transfer of Care (Signed)
Immediate Anesthesia Transfer of Care Note  Patient: Bruce [name]  Procedure(s) Performed: Procedure(s): NEPHROLITHOTOMY PERCUTANEOUS (Left)  Patient Location: PACU  Anesthesia Type:General  Level of Consciousness: awake, alert  and oriented  Airway & Oxygen Therapy: Patient Spontanous Breathing and Patient connected to face mask oxygen  Post-op Assessment: Report given to RN and Post -op Vital signs reviewed and stable  Post vital signs: Reviewed and stable  Last Vitals:  Vitals:   06/19/16 0933 06/19/16 1525  BP: 100/67 125/77  Pulse: 92 74  Resp: 16 15  Temp: 36.6 C 36.2 C    Last Pain:  Vitals:   06/19/16 1525  TempSrc:   PainSc: 0-No pain         Complications: No apparent anesthesia complications

## 2016-06-19 NOTE — H&P (View-Only) (Signed)
06/15/2016 12:11 PM   Bruce [name] [birth year] [id number]  Referring provider: Danelle Berry, NP [address] [place], [place] [address]  Chief Complaint  Patient presents with  . Prostate Cancer    biopsy results    HPI: 71 year old male with with newly diagnosed prostate cancer who presents today to review his pathology report. 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  He initially underwent prostate biopsy secondary to a PSA of 13.5 on 02/20/2016.   Repeat PSA was 18.5 on 04/06/2016.  Rectal exam was enlarged without nodules.  TRUS volume 56 g with intravesical protrusion of the median lobe (on cysto/ CT).  Prostate biopsy pathology shows Gleason 4+3 and 3+3 involving 4 of 12 cores, all on the right side.  Specifically, the right lateral base, the core comprises of 70% of Gleason 4 tumor involving up to 94% of the tissue.  CT abdomen pelvis with and without contrast on 05/09/2016 shows no evidence of lymphadenopathy or metastatic disease, although small mesenteric and retroperitoneal lymph nodes were noted but not reaching pathologic size.  Other findings as below.  At baseline, he has minimal urinary complaints. He has a decent stream without urgency or frequency. He is unsure if he has a history of erectile dysfunction, has not gotten an erection and many years, is not currently sexually active, and is not worried about his erectile status.  Past medical history significant for CAD with MI, COPD, and peripheral vascular disease.  He does take a Xarelto and was cleared to come off of this for his prostate biopsy and upcomming PCNL.  PMH: Past Medical History:  Diagnosis Date  . Anginal pain ([place])    Left side if chest ,NTG  relieves chaes apin 12/01/13  . Anxiety   . Arthritis   . CHF (congestive heart failure) ([place])   . 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 Complication of anesthesia    wake up with a head ache  . COPD (chronic obstructive pulmonary disease) ([place])   . Coronary artery disease   . GERD (gastroesophageal reflux disease)   . Headache    related to sinus congestion  . History of blood transfusion   . History of kidney stones   . Hypertension   . Myocardial infarction    '09 AND '12  . Peripheral vascular disease ([place])   . Shortness of breath    With exertion .  Marland Kitchen UTI (urinary tract infection)    frequent UTI    Surgical History: Past Surgical History:  Procedure Laterality Date  . CARDIAC CATHETERIZATION    . CHOLECYSTECTOMY    . EXTERNAL FIXATION LEG Right 10/06/2013   Procedure: CLOSED REDUCTION RIGHT TIBIAL PLATEAU FRACTURE, EXTERNAL FIXATION RIGHT LEG, PLACEMENT OF WOUND VAC;  Surgeon: Rozanna Box, MD;  Location: [facility];  Service: Orthopedics;  Laterality: Right;  . FEMORAL-POPLITEAL BYPASS GRAFT Right 10/06/2013   Procedure: RIGHT POPLITEAL-POPLITEAL ARTERY BYPASS GRAFT;  Surgeon: Rosetta Posner, MD;  Location: [facility];  Service: Vascular;  Laterality: Right;  . FRACTURE SURGERY Right 2014  . HARDWARE REMOVAL Right 12/02/2013   Procedure: REMOVAL EXTERNAL FIXATION RIGHT LEG ;  Surgeon: Rozanna Box, MD;  Location: [facility];  Service: Orthopedics;  Laterality: Right;  . HERNIA REPAIR Right 1990's  . I&D EXTREMITY Right 10/09/2013   Procedure: IRRIGATION AND DEBRIDEMENT RIGHT LEG,  CLOSURE  OF WOUNDS, PLACEMENT OF WOUND VAC ON EACH SIDE OF LEG;  Surgeon: Rozanna Box, MD;  Location: [facility];  Service: Orthopedics;  Laterality: Right;  . IVC filter  2009   placed @ [facility]/ Removed in 2010.  . IVC Filter Removed    . ligament leg Left   . ORIF TIBIA FRACTURE Right 12/29/2014   Procedure: OPEN REDUCTION INTERNAL FIXATION (ORIF) RIGHT TIBIA FRACTURE, RIA VS ICBG;  Surgeon: Altamese Frederick, MD;  Location: [facility];  Service: Orthopedics;  Laterality: Right;    Home Medications:  Allergies as of 06/15/2016       Reactions   Adhesive [tape] Other (See Comments)   After right leg fracture surgery, pt developed a large blister where tape was applied to his right leg. OK to use paper tape.   Imdur [isosorbide Dinitrate] Other (See Comments)   hallucinations   Singulair [montelukast Sodium] Other (See Comments)   Hallucinations      Medication List       Accurate as of 06/15/16 12:11 PM. Always use your most recent med list.          allopurinol 100 MG tablet Commonly known as:  ZYLOPRIM Take 100 mg by mouth at bedtime.   ALPRAZolam 0.25 MG tablet Commonly known as:  XANAX Take 1 tablet (0.25 mg total) by mouth daily as needed for anxiety.   atorvastatin 80 MG tablet Commonly known as:  LIPITOR Take 80 mg by mouth at bedtime.   benazepril-hydrochlorthiazide 20-12.5 MG tablet Commonly known as:  LOTENSIN HCT Take 1 tablet by mouth daily.   CALCIUM CITRATE PO Take 250 mg by mouth at bedtime.   DOCOSAHEXAENOIC ACID PO Take by mouth.   docusate sodium 100 MG capsule Commonly known as:  COLACE Take 100 mg by mouth daily as needed for mild constipation.   FISH OIL PO Take 1 capsule by mouth daily.   fluticasone 50 MCG/ACT nasal spray Commonly known as:  FLONASE Place 1 spray into both nostrils daily.   furosemide 20 MG tablet Commonly known as:  LASIX Take 20 mg by mouth daily.   hydrOXYzine 25 MG tablet Commonly known as:  ATARAX/VISTARIL Take 25 mg by mouth at bedtime as needed. sleep   loratadine 10 MG tablet Commonly known as:  CLARITIN Take 10 mg by mouth at bedtime.   metoprolol succinate 50 MG 24 hr tablet Commonly known as:  TOPROL-XL Take 1 tablet (50 mg total) by mouth daily.   multivitamin tablet Take 1 tablet by mouth daily. For Men   nitroGLYCERIN 0.4 MG SL tablet Commonly known as:  NITROSTAT Place 0.4 mg under the tongue every 5 (five) minutes as needed for chest pain.   omeprazole 40 MG capsule Commonly known as:  PRILOSEC Take 40 mg by mouth  daily.   PROBIOTIC FORMULA PO Take 1 Dose by mouth at bedtime.   RANEXA 1000 MG SR tablet Generic drug:  ranolazine Take 500 mg by mouth every morning.   rivaroxaban 20 MG Tabs tablet Commonly known as:  XARELTO Take 20 mg by mouth daily with supper.   traMADol 50 MG tablet Commonly known as:  ULTRAM Take 50 mg by mouth 2 (two) times daily.   traZODone 100 MG tablet Commonly known as:  DESYREL Take 1 tablet (100 mg total) by mouth at bedtime.   VITAMIN D3 PO Take 1,000 tablets by mouth daily.       Allergies:  Allergies  Allergen Reactions  . Adhesive [  Tape] Other (See Comments)    After right leg fracture surgery, pt developed a large blister where tape was applied to his right leg. OK to use paper tape.  . Imdur [Isosorbide Dinitrate] Other (See Comments)    hallucinations  . Singulair [Montelukast Sodium] Other (See Comments)    Hallucinations     Family History: Family History  Problem Relation Age of Onset  . Prostate cancer Brother     Social History:  reports that he has been smoking.  He has a 26.00 pack-year smoking history. He has never used smokeless tobacco. He reports that he does not drink alcohol or use drugs.  ROS: UROLOGY Frequent Urination?: No Hard to postpone urination?: No Burning/pain with urination?: No Get up at night to urinate?: No Leakage of urine?: No Urine stream starts and stops?: No Trouble starting stream?: No Do you have to strain to urinate?: No Blood in urine?: Yes Urinary tract infection?: No Sexually transmitted disease?: No Injury to kidneys or bladder?: No Painful intercourse?: No Weak stream?: No Erection problems?: No Penile pain?: No  Gastrointestinal Nausea?: No Vomiting?: No Indigestion/heartburn?: No Diarrhea?: No Constipation?: No  Constitutional Fever: No Night sweats?: No Weight loss?: No Fatigue?: No  Skin Skin rash/lesions?: No Itching?: No  Eyes Blurred vision?: No Double vision?:  No  Ears/Nose/Throat Sore throat?: No Sinus problems?: No  Hematologic/Lymphatic Swollen glands?: No Easy bruising?: No  Cardiovascular Leg swelling?: No Chest pain?: No  Respiratory Cough?: No Shortness of breath?: No  Endocrine Excessive thirst?: No  Musculoskeletal Back pain?: No Joint pain?: No  Neurological Headaches?: No Dizziness?: No  Psychologic Depression?: No Anxiety?: No  Physical Exam: BP (!) 96/59   Pulse 80   Ht 6\' 1"  (1.854 m)   Wt 211 lb (95.7 kg)   BMI 27.84 kg/m   Constitutional:  Alert and oriented, No acute distress. HEENT: [place] AT, moist mucus membranes.  Trachea midline, no masses. Cardiovascular: No clubbing, cyanosis, or edema. Respiratory: Normal respiratory effort, no increased work of breathing. GI: Abdomen is soft, nontender, nondistended, no abdominal masses GU: No CVA tenderness.  Skin: No rashes, bruises or suspicious lesions. Neurologic: Grossly intact, no focal deficits, moving all 4 extremities. Psychiatric: Normal mood and affect.  Laboratory Data: Lab Results  Component Value Date   WBC 7.2 06/12/2016   HGB 15.0 06/12/2016   HCT 44.2 06/12/2016   MCV 94.1 06/12/2016   PLT 242 06/12/2016    Lab Results  Component Value Date   CREATININE 1.32 (H) 06/12/2016   Component     Latest Ref Rng & Units 04/06/2016  PSA     0.0 - 4.0 ng/mL 18.5 (H)    Urinalysis    Component Value Date/Time   COLORURINE AMBER (A) 06/12/2016 0902   APPEARANCEUR CLOUDY (A) 06/12/2016 0902   APPEARANCEUR Clear 04/10/2016 1035   LABSPEC 1.023 06/12/2016 0902   PHURINE 5.0 06/12/2016 0902   GLUCOSEU NEGATIVE 06/12/2016 0902   HGBUR LARGE (A) 06/12/2016 0902   BILIRUBINUR NEGATIVE 06/12/2016 0902   BILIRUBINUR Negative 04/10/2016 1035   KETONESUR NEGATIVE 06/12/2016 0902   PROTEINUR 30 (A) 06/12/2016 0902   NITRITE NEGATIVE 06/12/2016 0902   LEUKOCYTESUR NEGATIVE 06/12/2016 0902   LEUKOCYTESUR Negative 04/10/2016 1035     Pertinent Imaging: CT abdomen pelvis with and without contrast from 05/09/2016 reviewed again today personally  IMPRESSION: 1. Segmental staghorn type calculus in the midpole region of the left kidney. 2. No renal or bladder lesions. 3. Mild uniform bladder wall  thickening could be due to partial bladder outlet obstruction. Mild prostate gland enlargement with median lobe hypertrophy impressing on the base of the bladder. 4. No acute abdominal/pelvic findings, mass lesions or lymphadenopathy. 5. Moderate atherosclerotic calcifications involving the aorta and iliac arteries.   Assessment & Plan:    1. Prostate cancer ([place]) Intermediate (borderline high) risk Gleason 4+3 prostate cancer on the right, PSA 18 without evidence of metastatic disease on CT abdomen pelvis.  MSK nomogram 10/15 year progression free survival 49%/34%, OCD 25%, ECE 70%, LN 9% and 9%, and SV 7%.   Given the extent of his disease and presented to have higher risk Gleason 4, I have recommended curative intervention either in the form of prostatectomy over external beam radiation with ADT.  I would also like to get a bone scan given the extent of his disease to rule out occult boney metastasis.  The patient was counseled about the natural history of prostate cancer and the standard treatment options that are available for prostate cancer. It was explained to him how his age and life expectancy, clinical stage, Gleason score, and PSA affect his prognosis, the decision to proceed with additional staging studies, as well as how that information influences recommended treatment strategies. We discussed the roles for active surveillance, radiation therapy, surgical therapy, androgen deprivation, as well as ablative therapy options for the treatment of prostate cancer as appropriate to his individual cancer situation. We discussed the risks and benefits of these options with regard to their impact on cancer control and also  in terms of potential adverse events, complications, and impact on quality of life particularly related to urinary, bowel, and sexual function. The patient was encouraged to ask questions throughout the discussion today and all questions were answered to his stated satisfaction. In addition, the patient was provided with and/or directed to appropriate resources and literature for further education about prostate cancer treatment options.  Unfortunately, he is illiterate but does have friends/ family who can read to him.    We discussed surgical therapy for prostate cancer including the different available surgical approaches. We discussed, in detail, the risks and expectations of surgery with regard to cancer control, urinary control, and erectile dysfunction as well as expected post operative cover he processed. Additional risks of surgery including but not limalited to bleeding, infection, hernia formation, nerve damage, steel formation, bowel/rect injury, potentially necessitating colostomy, damage to the urinary tract resulting in urinary leakage, urethral stricture, and cardiopulmonary risk such as myocardial infarction, stroke, death, thromboembolism etc. were explained. The risk of open surgical conversion for robotics/laparoscopic prostatectomy is also discussed.  Specifically today, we discussed that if he underwent surgery, a wide approach would be performed on the right side with bilateral pelvic lymph node dissection with high risk of needing adjuvant or salvage therapy.  At this point, he is unsure how would like to proceed. I have recommended consultation with radiation oncology to explore his options. He is agreeable with this plan.  2. Kidney stone on left side Scheduled for [facility] [place]   Return for scheduled for surgery monday for PCNL.  Hollice Espy, MD  [facility] [address] [place], [place] [address] [phone]

## 2016-06-19 NOTE — Procedures (Signed)
Left staghorn renal calculus  Status post ultrasound and fluoroscopic left nephroureteral access for operative percutaneous nephrolithotomy  No immediate combination  EBL 0  Full report in PACs

## 2016-06-19 NOTE — Progress Notes (Signed)
MD aware of pt's temperature at 94.5 tympanically. Unable to register orally, with multiple machines. No new orders given.

## 2016-06-20 ENCOUNTER — Encounter: Payer: Self-pay | Admitting: Urology

## 2016-06-20 DIAGNOSIS — N200 Calculus of kidney: Secondary | ICD-10-CM | POA: Diagnosis not present

## 2016-06-20 LAB — BASIC METABOLIC PANEL
Anion gap: 5 (ref 5–15)
BUN: 14 mg/dL (ref 6–20)
CALCIUM: 8.6 mg/dL — AB (ref 8.9–10.3)
CO2: 26 mmol/L (ref 22–32)
CREATININE: 1.44 mg/dL — AB (ref 0.61–1.24)
Chloride: 105 mmol/L (ref 101–111)
GFR, EST AFRICAN AMERICAN: 56 mL/min — AB (ref >60)
GFR, EST NON AFRICAN AMERICAN: 48 mL/min — AB (ref >60)
Glucose, Bld: 174 mg/dL — ABNORMAL HIGH (ref 65–99)
Potassium: 4.1 mmol/L (ref 3.5–5.1)
SODIUM: 136 mmol/L (ref 135–145)

## 2016-06-20 LAB — CBC
HCT: 34.7 % — ABNORMAL LOW (ref 40.0–52.0)
Hemoglobin: 12 g/dL — ABNORMAL LOW (ref 13.0–18.0)
MCH: 32.3 pg (ref 26.0–34.0)
MCHC: 34.7 g/dL (ref 32.0–36.0)
MCV: 93.3 fL (ref 80.0–100.0)
PLATELETS: 185 10*3/uL (ref 150–440)
RBC: 3.72 MIL/uL — ABNORMAL LOW (ref 4.40–5.90)
RDW: 13.3 % (ref 11.5–14.5)
WBC: 13.1 10*3/uL — AB (ref 3.8–10.6)

## 2016-06-20 MED ORDER — HYDROCODONE-ACETAMINOPHEN 5-325 MG PO TABS: 1.0000 | ORAL_TABLET | Freq: Four times a day (QID) | ORAL | 0 refills | Status: DC | PRN

## 2016-06-20 MED ORDER — DOCUSATE SODIUM 100 MG PO CAPS: 100.0000 mg | ORAL_CAPSULE | Freq: Two times a day (BID) | ORAL | 0 refills | Status: DC

## 2016-06-20 NOTE — Progress Notes (Signed)
1 Day Post-Op Subjective: The patient is doing well.  No nausea or vomiting. Pain is adequately controlled.  Anxious to have penile Foley removed. Mildly hypotensive overnight but states his blood pressure is "all over the place" at baseline, BP meds held.  Objective: Vital signs in last 24 hours: Temp:  [94.4 F (34.7 C)-98.8 F (37.1 C)] 98.2 F (36.8 C) (04/17 0759) Pulse Rate:  [71-89] 85 (04/17 0759) Resp:  [13-18] 16 (04/17 0759) BP: (83-126)/(44-77) 99/51 (04/17 0759) SpO2:  [87 %-100 %] 92 % (04/17 0759)  Intake/Output from previous day: 04/16 0701 - 04/17 0700 In: 2318 [P.O.:358; I.V.:1410; IV Piggyback:550] Out: 1110 [Urine:1100; Blood:10] Intake/Output this shift: Total I/O In: 855 [P.O.:480; I.V.:375] Out: 150 [Urine:150]  Physical Exam:  General: Alert and oriented. CV: RRR Lungs: Clear bilaterally. GI: Soft, Nondistended. Incisions: Left nephrostomy tube in place draining light pink urine. Urine: Clear, Foley in place. Extremities: Nontender, no erythema, no edema.  Lab Results:  Recent Labs  06/20/16 0516  HGB 12.0*  HCT 34.7*          Recent Labs  06/20/16 0516  CREATININE 1.44*           Results for orders placed or performed during the hospital encounter of 06/19/16 (from the past 24 hour(s))  CBC     Status: Abnormal   Collection Time: 06/20/16  5:16 AM  Result Value Ref Range   WBC 13.1 (H) 3.8 - 10.6 K/uL   RBC 3.72 (L) 4.40 - 5.90 MIL/uL   Hemoglobin 12.0 (L) 13.0 - 18.0 g/dL   HCT 34.7 (L) 40.0 - 52.0 %   MCV 93.3 80.0 - 100.0 fL   MCH 32.3 26.0 - 34.0 pg   MCHC 34.7 32.0 - 36.0 g/dL   RDW 13.3 11.5 - 14.5 %   Platelets 185 150 - 440 K/uL  Basic metabolic panel     Status: Abnormal   Collection Time: 06/20/16  5:16 AM  Result Value Ref Range   Sodium 136 135 - 145 mmol/L   Potassium 4.1 3.5 - 5.1 mmol/L   Chloride 105 101 - 111 mmol/L   CO2 26 22 - 32 mmol/L   Glucose, Bld 174 (H) 65 - 99 mg/dL   BUN 14 6 - 20 mg/dL   Creatinine, Ser 1.44 (H) 0.61 - 1.24 mg/dL   Calcium 8.6 (L) 8.9 - 10.3 mg/dL   GFR calc non Af Amer 48 (L) >60 mL/min   GFR calc Af Amer 56 (L) >60 mL/min   Anion gap 5 5 - 15    Assessment/Plan: POD# 1 s/p uncomplicated left PCNL  1) Ambulate, Incentive spirometry 2) d/c IVF 3) Transition to oral pain medication 4) D/C Foley 5) clamp nephostomy tube, consider removing later day if doing well 6) IS, pulmonary toilet   Hollice Espy, MD   LOS: 0 days   Hollice Espy 06/20/2016, 12:28 PM

## 2016-06-20 NOTE — Progress Notes (Signed)
Pt d/c to home today.   IV removed intact.  Rx's given to pt w/all questions and concerns addressed.  D/C paperwork reviewed and education provided with all questions and concerns addressed.  [facility] at bedside for home transport.   

## 2016-06-20 NOTE — Discharge Instructions (Signed)
You will have an incision on her left flank. You'll be supplied with dressing supplies to cover the incision and keep it dry. In the beginning, he will leak more from the incision and this will taper off over time. You may shower with an occlusive dressing in place. No baths.  If you have any fevers greater than 100.4, please call our office or go to the emergency room.

## 2016-06-20 NOTE — Anesthesia Postprocedure Evaluation (Signed)
Anesthesia Post Note  Patient: Bruce [name]  Procedure(s) Performed: Procedure(s) (LRB): NEPHROLITHOTOMY PERCUTANEOUS (Left)  Patient location during evaluation: PACU Anesthesia Type: General Level of consciousness: awake and alert Pain management: pain level controlled Vital Signs Assessment: post-procedure vital signs reviewed and stable Respiratory status: spontaneous breathing, nonlabored ventilation, respiratory function stable and patient connected to nasal cannula oxygen Cardiovascular status: blood pressure returned to baseline and stable Postop Assessment: no signs of nausea or vomiting Anesthetic complications: no     Last Vitals:  Vitals:   06/20/16 0654 06/20/16 0759  BP: (!) 110/57 (!) 99/51  Pulse: 89 85  Resp:  16  Temp:  36.8 C    Last Pain:  Vitals:   06/20/16 0759  TempSrc: Oral  PainSc:                  [name]

## 2016-06-20 NOTE — Progress Notes (Signed)
Dr. Erlene Quan notified of continued hypotension; bolus completed; output  454ml (foley/nephrostomy tube); asymptomatic; Pulse 87; acknowledged; "We'll let him be right now, monitor BP/P more often, call if he becomes symptomatic or tachycardic; Shamra Bradeen K, RN1:02 AM 06/20/2016

## 2016-06-20 NOTE — Care Management Obs Status (Signed)
[facility] NOTIFICATION   Patient Details  Name: Bruce [name] MRN: [id number] Date of Birth: Aug 30, 1945   Medicare Observation Status Notification Given:  Yes    Marshell Garfinkel, RN 06/20/2016, 2:59 PM

## 2016-06-20 NOTE — Discharge Summary (Signed)
Date of admission: 06/19/2016  Date of discharge: 06/20/2016  Admission diagnosis: Left partial staghorn calculus  Discharge diagnosis: same  Secondary diagnoses:  Patient Active Problem List   Diagnosis Date Noted  . 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 Visual hallucination 03/18/2015  . Auditory hallucination 03/18/2015  . 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 Tibial plateau fracture 12/29/2014  . Limb ischemia 11/18/2013  . Blunt trauma of lower leg 10/08/2013  . 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 Acute blood loss anemia 10/08/2013  . Anxiety disorder 10/08/2013  . Dyslipidemia 10/08/2013  . GERD (gastroesophageal reflux disease) 10/08/2013  . History of MI (myocardial infarction) 10/08/2013  . Anticoagulated 10/08/2013  . Hypertension   . Tibia/fibula fracture 10/06/2013  . CAD (coronary artery disease) 08/16/2010  . Edema 08/16/2010  . COPD (chronic obstructive pulmonary disease) ([place]) 08/16/2010  . DVT of leg (deep venous thrombosis) ([place]) 08/16/2010  . Leg cramps 08/16/2010    History and Physical: For full details, please see admission history and physical. Briefly, Bruce [name] is a 71 y.o. year old patient with a left staghorn calculus status post PCNL admitted for overnight observation  Hospital Course: Patient tolerated the procedure well.  He was then transferred to the floor after an uneventful PACU stay.  His hospital course was uncomplicated.   His Foley catheter was removed early in the morning on postop day 1.  His nephrostomy tube was clamped which was well tolerated and removed just prior to discharge. On POD#1 he had met discharge criteria: was eating a regular diet, was up and ambulating independently,  pain was well controlled, was voiding without a catheter, and was ready to for discharge.   Laboratory values:   Recent Labs  06/20/16 0516  WBC 13.1*  HGB 12.0*  HCT 34.7*    Recent  Labs  06/20/16 0516  NA 136  K 4.1  CL 105  CO2 26  GLUCOSE 174*  BUN 14  CREATININE 1.44*  CALCIUM 8.6*    Recent Labs  06/19/16 0649  INR 0.92   No results for input(s): LABURIN in the last 72 hours. Results for orders placed or performed during the hospital encounter of 06/12/16  Urine culture     Status: None   Collection Time: 06/12/16  9:02 AM  Result Value Ref Range Status   Specimen Description URINE, RANDOM  Final   Special Requests NONE  Final   Culture   Final    NO GROWTH Performed at [facility], [address], [place], [place] [address]    Report Status 06/13/2016 FINAL  Final    Disposition: Home  Discharge instruction: Dressing changes as needed.  Discharge medications:  Allergies as of 06/20/2016      Reactions   Adhesive [tape] Other (See Comments)   After right leg fracture surgery, pt developed a large blister where tape was applied to his right leg. OK to use paper tape.   Imdur [isosorbide Dinitrate] Other (See Comments)   hallucinations   Singulair [montelukast Sodium] Other (See Comments)   Hallucinations      Medication List    TAKE these medications   allopurinol 100 MG tablet Commonly known as:  ZYLOPRIM Take 100 mg by mouth at bedtime.   ALPRAZolam 0.25 MG tablet Commonly known as:  XANAX Take 1 tablet (0.25 mg total) by mouth daily as needed for anxiety.   atorvastatin 80 MG tablet Commonly known as:  LIPITOR Take 80 mg by mouth at bedtime.   benazepril-hydrochlorthiazide 20-12.5 MG tablet Commonly known as:  LOTENSIN HCT Take 1 tablet by mouth daily.   CALCIUM CITRATE PO Take 250 mg by mouth at bedtime.   DOCOSAHEXAENOIC ACID PO Take by mouth.   docusate sodium 100 MG capsule Commonly known as:  COLACE Take 100 mg by mouth daily as needed for mild constipation. What changed:  Another medication with the same name was added. Make sure you understand how and when to take each.   docusate sodium 100 MG  capsule Commonly known as:  COLACE Take 1 capsule (100 mg total) by mouth 2 (two) times daily. What changed:  You were already taking a medication with the same name, and this prescription was added. Make sure you understand how and when to take each.   FISH OIL PO Take 1 capsule by mouth daily.   fluticasone 50 MCG/ACT nasal spray Commonly known as:  FLONASE Place 1 spray into both nostrils daily.   furosemide 20 MG tablet Commonly known as:  LASIX Take 20 mg by mouth daily.   HYDROcodone-acetaminophen 5-325 MG tablet Commonly known as:  NORCO/VICODIN Take 1-2 tablets by mouth every 6 (six) hours as needed for moderate pain.   hydrOXYzine 25 MG tablet Commonly known as:  ATARAX/VISTARIL Take 25 mg by mouth at bedtime as needed. sleep   loratadine 10 MG tablet Commonly known as:  CLARITIN Take 10 mg by mouth at bedtime.   metoprolol succinate 50 MG 24 hr tablet Commonly known as:  TOPROL-XL Take 1 tablet (50 mg total) by mouth daily. What changed:  when to take this   multivitamin tablet Take 1 tablet by mouth daily. For Men   nitroGLYCERIN 0.4 MG SL tablet Commonly known as:  NITROSTAT Place 0.4 mg under the tongue every 5 (five) minutes as needed for chest pain.   omeprazole 40 MG capsule Commonly known as:  PRILOSEC Take 40 mg by mouth daily.   PROBIOTIC FORMULA PO Take 1 Dose by mouth at bedtime.   RANEXA 1000 MG SR tablet Generic drug:  ranolazine Take 1,000 mg by mouth every morning.   rivaroxaban 20 MG Tabs tablet Commonly known as:  XARELTO Take 20 mg by mouth daily with supper.   traZODone 100 MG tablet Commonly known as:  DESYREL Take 1 tablet (100 mg total) by mouth at bedtime.   VITAMIN D3 PO Take 1,000 tablets by mouth daily.       Followup:  Follow-up Information    Hollice Espy, MD In 4 weeks.   Specialty:  Urology Why:  For follow up with renal ultrasound/ KUB prior to visit Contact information: [address]  [place] [address] [phone]

## 2016-06-26 ENCOUNTER — Ambulatory Visit
Admission: RE | Admit: 2016-06-26 | Discharge: 2016-06-26 | Disposition: A | Discharge status: Home / Self Care | Payer: Medicare HMO | Source: Ambulatory Visit | Attending: Radiation Oncology | Admitting: Radiation Oncology

## 2016-06-26 ENCOUNTER — Encounter: Payer: Self-pay | Admitting: Radiation Oncology

## 2016-06-26 ENCOUNTER — Other Ambulatory Visit: Payer: Self-pay | Admitting: *Deleted

## 2016-06-26 VITALS — BP 138/83 | HR 55 | Temp 95.8°F | Resp 18 | Ht 73.0 in | Wt 212.4 lb

## 2016-06-26 DIAGNOSIS — F17210 Nicotine dependence, cigarettes, uncomplicated: Secondary | ICD-10-CM | POA: Diagnosis not present

## 2016-06-26 DIAGNOSIS — Z7901 Long term (current) use of anticoagulants: Secondary | ICD-10-CM | POA: Diagnosis not present

## 2016-06-26 DIAGNOSIS — I509 Heart failure, unspecified: Secondary | ICD-10-CM | POA: Insufficient documentation

## 2016-06-26 DIAGNOSIS — K219 Gastro-esophageal reflux disease without esophagitis: Secondary | ICD-10-CM | POA: Insufficient documentation

## 2016-06-26 DIAGNOSIS — M129 Arthropathy, unspecified: Secondary | ICD-10-CM | POA: Diagnosis not present

## 2016-06-26 DIAGNOSIS — Z510 Encounter for antineoplastic radiation therapy: Secondary | ICD-10-CM | POA: Diagnosis not present

## 2016-06-26 DIAGNOSIS — I2510 Atherosclerotic heart disease of native coronary artery without angina pectoris: Secondary | ICD-10-CM | POA: Insufficient documentation

## 2016-06-26 DIAGNOSIS — F419 Anxiety disorder, unspecified: Secondary | ICD-10-CM | POA: Diagnosis not present

## 2016-06-26 DIAGNOSIS — Z87440 Personal history of urinary (tract) infections: Secondary | ICD-10-CM | POA: Diagnosis not present

## 2016-06-26 DIAGNOSIS — R0602 Shortness of breath: Secondary | ICD-10-CM | POA: Insufficient documentation

## 2016-06-26 DIAGNOSIS — J449 Chronic obstructive pulmonary disease, unspecified: Secondary | ICD-10-CM | POA: Insufficient documentation

## 2016-06-26 DIAGNOSIS — Z87442 Personal history of urinary calculi: Secondary | ICD-10-CM | POA: Diagnosis not present

## 2016-06-26 DIAGNOSIS — Z8781 Personal history of (healed) traumatic fracture: Secondary | ICD-10-CM | POA: Insufficient documentation

## 2016-06-26 DIAGNOSIS — Z79899 Other long term (current) drug therapy: Secondary | ICD-10-CM | POA: Insufficient documentation

## 2016-06-26 DIAGNOSIS — I739 Peripheral vascular disease, unspecified: Secondary | ICD-10-CM | POA: Insufficient documentation

## 2016-06-26 DIAGNOSIS — Z8041 Family history of malignant neoplasm of ovary: Secondary | ICD-10-CM | POA: Insufficient documentation

## 2016-06-26 DIAGNOSIS — I209 Angina pectoris, unspecified: Secondary | ICD-10-CM | POA: Diagnosis not present

## 2016-06-26 DIAGNOSIS — I10 Essential (primary) hypertension: Secondary | ICD-10-CM | POA: Insufficient documentation

## 2016-06-26 DIAGNOSIS — C61 Malignant neoplasm of prostate: Secondary | ICD-10-CM | POA: Insufficient documentation

## 2016-06-26 DIAGNOSIS — I252 Old myocardial infarction: Secondary | ICD-10-CM | POA: Diagnosis not present

## 2016-06-26 NOTE — Consult Note (Signed)
NEW PATIENT EVALUATION  Name: Bruce [name]  MRN: [id number]  Date:   06/26/2016     DOB: 09-12-1945   This 71 y.o. 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  REFERRING PHYSICIAN: Danelle Berry, NP  CHIEF COMPLAINT:  Chief Complaint  Patient presents with  . Prostate Cancer    Pt is here for initial consultation of prostate cancer.     DIAGNOSIS: The encounter diagnosis was Malignant neoplasm of prostate ([place]).   PREVIOUS INVESTIGATIONS:  Lab data reviewed Bone scan ordered Clinical notes reviewed Surgical pathology reports reviewed  HPI: Patient is a 71 year old male who presented to his family doctor with a PSA of 18.5. He has a prostate volume of 56 g was noted to have transrectal ultrasound-guided biopsy a Gleason score of 7 (4+3) on for of 12 cores. CT scan of the abdomen pelvis in March 2018 showed no evidence of lymphadenopathy or metastatic disease. He has significant comorbidities including coronary artery disease with a prior MI COPD and peripheral vascular disease. He does take blood thinner and was cleared to come off of that for his prostate biopsy. He has very little lower urinary tract symptoms nocturia 1 no frequency or urgency. He recently had a recent removal of [place] back warm calculus from his kidney and is recovering well from that.  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    PAST SURGICAL HISTORY:  Past Surgical History:  Procedure Laterality Date  . CARDIAC CATHETERIZATION    . CHOLECYSTECTOMY    . EXTERNAL FIXATION LEG Right 10/06/2013   Procedure: CLOSED REDUCTION RIGHT TIBIAL PLATEAU FRACTURE, EXTERNAL FIXATION RIGHT LEG, PLACEMENT OF WOUND VAC;  Surgeon: Rozanna Box, MD;  Location: [facility];  Service: Orthopedics;  Laterality: Right;  . FEMORAL-POPLITEAL BYPASS GRAFT Right 10/06/2013   Procedure: RIGHT POPLITEAL-POPLITEAL ARTERY BYPASS GRAFT;  Surgeon: Rosetta Posner, MD;  Location: [facility];  Service: Vascular;  Laterality: Right;  . FRACTURE SURGERY Right 2014  . HARDWARE REMOVAL Right 12/02/2013   Procedure: REMOVAL EXTERNAL FIXATION RIGHT LEG ;  Surgeon: Rozanna Box, MD;  Location: [facility];  Service: Orthopedics;  Laterality: Right;  . HERNIA REPAIR Right 1990's  . I&D EXTREMITY Right 10/09/2013   Procedure: IRRIGATION AND DEBRIDEMENT RIGHT LEG, CLOSURE  OF WOUNDS, PLACEMENT OF WOUND VAC ON EACH SIDE OF LEG;  Surgeon: Rozanna Box, MD;  Location: [facility];  Service: Orthopedics;  Laterality: Right;  . IR NEPHROSTOMY PLACEMENT LEFT  06/19/2016  . IVC filter  2009   placed @ [facility]/ Removed in 2010.  . IVC Filter Removed    . ligament leg Left   . NEPHROLITHOTOMY Left 06/19/2016   Procedure: NEPHROLITHOTOMY PERCUTANEOUS;  Surgeon: Hollice Espy, MD;  Location: [facility];  Service: Urology;  Laterality: Left;  . ORIF TIBIA FRACTURE Right 12/29/2014   Procedure: OPEN REDUCTION INTERNAL FIXATION (ORIF) RIGHT TIBIA FRACTURE, RIA VS  ICBG;  Surgeon: Altamese Blaine, MD;  Location: [facility];  Service: Orthopedics;  Laterality: Right;    FAMILY HISTORY: family history includes Prostate cancer in his brother.  SOCIAL HISTORY:  reports that he has been smoking.  He has a 26.00 pack-year smoking history. He has never used smokeless  tobacco. He reports that he does not drink alcohol or use drugs.  ALLERGIES: Adhesive [tape]; Imdur [isosorbide dinitrate]; and Singulair [montelukast sodium]  MEDICATIONS:  Current Outpatient Prescriptions  Medication Sig Dispense Refill  . allopurinol (ZYLOPRIM) 100 MG tablet Take 100 mg by mouth at bedtime.    . ALPRAZolam (XANAX) 0.25 MG tablet Take 1 tablet (0.25 mg total) by mouth daily as needed for anxiety. 30 tablet 2  . atorvastatin (LIPITOR) 80 MG tablet Take 80 mg by mouth at bedtime.     . benazepril-hydrochlorthiazide (LOTENSIN HCT) 20-12.5 MG per tablet Take 1 tablet by mouth daily.    Marland Kitchen CALCIUM CITRATE PO Take 250 mg by mouth at bedtime.     . Cholecalciferol (VITAMIN D3 PO) Take 1,000 tablets by mouth daily.     . DOCOSAHEXAENOIC ACID PO Take by mouth.    . docusate sodium (COLACE) 100 MG capsule Take 100 mg by mouth daily as needed for mild constipation.    . docusate sodium (COLACE) 100 MG capsule Take 1 capsule (100 mg total) by mouth 2 (two) times daily. 60 capsule 0  . fluticasone (FLONASE) 50 MCG/ACT nasal spray Place 1 spray into both nostrils daily.    . furosemide (LASIX) 20 MG tablet Take 20 mg by mouth daily.    Marland Kitchen HYDROcodone-acetaminophen (NORCO/VICODIN) 5-325 MG tablet Take 1-2 tablets by mouth every 6 (six) hours as needed for moderate pain. 15 tablet 0  . hydrOXYzine (ATARAX/VISTARIL) 25 MG tablet Take 25 mg by mouth at bedtime as needed. sleep    . loratadine (CLARITIN) 10 MG tablet Take 10 mg by mouth at bedtime.     . metoprolol succinate (TOPROL-XL) 50 MG 24 hr tablet Take 1 tablet (50 mg total) by mouth daily. (Patient taking differently: Take 50 mg by mouth at bedtime. ) 90 tablet 0  . Multiple Vitamin (MULTIVITAMIN) tablet Take 1 tablet by mouth daily. For Men    . nitroGLYCERIN (NITROSTAT) 0.4 MG SL tablet Place 0.4 mg under the tongue every 5 (five) minutes as needed for chest pain.     . Omega-3 Fatty Acids (FISH OIL PO) Take 1 capsule by mouth daily.     Marland Kitchen omeprazole (PRILOSEC) 40 MG capsule Take 40 mg by mouth daily.    . Probiotic Product (PROBIOTIC FORMULA PO) Take 1 Dose by mouth at bedtime.    . ranolazine (RANEXA) 1000 MG SR tablet Take 1,000 mg by mouth every morning.     . traZODone (DESYREL) 100 MG tablet Take 1 tablet (100 mg total) by mouth at bedtime. 30 tablet 2  . rivaroxaban (XARELTO) 20 MG TABS tablet Take 20 mg by mouth daily with supper.     No current facility-administered medications for this encounter.     ECOG PERFORMANCE STATUS:  0 - Asymptomatic  REVIEW OF SYSTEMS:  Patient denies any weight loss, fatigue, weakness, fever, chills or night sweats. Patient denies any loss of vision, blurred vision. Patient denies any ringing  of the ears or hearing loss. No irregular heartbeat. Patient denies heart murmur or history of fainting. Patient denies any chest pain or pain radiating to her upper extremities. Patient denies any shortness of breath,  difficulty breathing at night, cough or hemoptysis. Patient denies any swelling in the lower legs. Patient denies any nausea vomiting, vomiting of blood, or coffee ground material in the vomitus. Patient denies any stomach pain. Patient states has had normal bowel movements no significant constipation or diarrhea. Patient denies any dysuria, hematuria or significant nocturia. Patient denies any problems walking, swelling in the joints or loss of balance. Patient denies any skin changes, loss of hair or loss of weight. Patient denies any excessive worrying or anxiety or significant depression. Patient denies any problems with insomnia. Patient denies excessive thirst, polyuria, polydipsia. Patient denies any swollen glands, patient denies easy bruising or easy bleeding. Patient denies any recent infections, allergies or URI. Patient "s visual fields have not changed significantly in recent time.    PHYSICAL EXAM: BP 138/83   Pulse (!) 55   Temp (!) 95.8 F (35.4 C)   Resp 18   Ht 6'  1" (1.854 m)   Wt 212 lb 6.6 oz (96.3 kg)   BMI 28.02 kg/m  On rectal exam rectal center tone is good prostate is somewhat firm the right lateral lobe although soaks sulcus is preserved bilaterally. No other abdomen and the seminal vesicle region is noted. Well-developed well-nourished patient in NAD. HEENT reveals PERLA, EOMI, discs not visualized.  Oral cavity is clear. No oral mucosal lesions are identified. Neck is clear without evidence of cervical or supraclavicular adenopathy. Lungs are clear to A&P. Cardiac examination is essentially unremarkable with regular rate and rhythm without murmur rub or thrill. Abdomen is benign with no organomegaly or masses noted. Motor sensory and DTR levels are equal and symmetric in the upper and lower extremities. Cranial nerves II through XII are grossly intact. Proprioception is intact. No peripheral adenopathy or edema is identified. No motor or sensory levels are noted. Crude visual fields are within normal range.  LABORATORY DATA: Pathology reports reviewed    RADIOLOGY RESULTS: Bone scan ordered   IMPRESSION: Stage IIa Gleason 7 (4+3) adenocarcinoma the prostate in 71 year old male  PLAN: With the patient's significant comorbidities and [facility] nomogram showing up to a 13% chance of pelvic lymph node involvement as well as only 25% chance of disease confined to the prostate I would favor going ahead with pelvic radiation therapy as well as treatment to his prostate and seminal vesicle up to 5000 cGy then boosting his prostate with I-125 interstitial implant. Risks and benefits of treatment including increased lower urinary tract symptoms diarrhea fatigue alteration of blood counts possible skin reaction all were discussed in detail with the patient and his family. We are clearing him to proceed with Lupron therapy. I've also ordered a bone scan and we'll evaluate that prior to proceeding with any treatment. I've also personally given him  a simulation appointment for treatment planning a visit external beam treatment to his prostate and pelvic nodes for first week in May. Patient and his family seem to comprehend my treatment plan well.  I would like to take this opportunity to thank you for allowing me to participate in the care of your patient.Armstead Peaks., MD

## 2016-06-27 ENCOUNTER — Other Ambulatory Visit: Payer: Self-pay | Admitting: *Deleted

## 2016-06-28 ENCOUNTER — Other Ambulatory Visit: Payer: Self-pay | Admitting: *Deleted

## 2016-06-28 DIAGNOSIS — C61 Malignant neoplasm of prostate: Secondary | ICD-10-CM

## 2016-06-28 LAB — STONE ANALYSIS
CA PHOS CRY STONE QL IR: 10 %
Ca Oxalate,Monohydr.: 90 %
Stone Weight KSTONE: 1522.9 mg

## 2016-07-05 ENCOUNTER — Inpatient Hospital Stay: Payer: Medicare HMO | Attending: Radiation Oncology

## 2016-07-05 ENCOUNTER — Ambulatory Visit
Admission: RE | Admit: 2016-07-05 | Discharge: 2016-07-05 | Disposition: A | Discharge status: Home / Self Care | Payer: Medicare HMO | Source: Ambulatory Visit | Attending: Radiation Oncology | Admitting: Radiation Oncology

## 2016-07-05 DIAGNOSIS — C61 Malignant neoplasm of prostate: Secondary | ICD-10-CM | POA: Diagnosis not present

## 2016-07-05 DIAGNOSIS — Z79818 Long term (current) use of other agents affecting estrogen receptors and estrogen levels: Secondary | ICD-10-CM | POA: Insufficient documentation

## 2016-07-05 MED ORDER — LEUPROLIDE ACETATE (4 MONTH) 30 MG IM KIT
30.0000 mg | PACK | Freq: Once | INTRAMUSCULAR | Status: AC
  Administered 2016-07-05: 30 mg via INTRAMUSCULAR
  Filled 2016-07-05: qty 30

## 2016-07-06 ENCOUNTER — Encounter
Admission: RE | Admit: 2016-07-06 | Discharge: 2016-07-06 | Disposition: A | Discharge status: Home / Self Care | Payer: Medicare HMO | Source: Ambulatory Visit | Attending: Radiation Oncology | Admitting: Radiation Oncology

## 2016-07-06 DIAGNOSIS — C61 Malignant neoplasm of prostate: Secondary | ICD-10-CM

## 2016-07-06 MED ORDER — TECHNETIUM TC 99M MEDRONATE IV KIT
25.0000 | PACK | Freq: Once | INTRAVENOUS | Status: AC | PRN
  Administered 2016-07-06: 21.33 via INTRAVENOUS

## 2016-07-10 ENCOUNTER — Ambulatory Visit (INDEPENDENT_AMBULATORY_CARE_PROVIDER_SITE_OTHER): Payer: Medicare HMO | Admitting: Psychiatry

## 2016-07-10 ENCOUNTER — Encounter: Payer: Self-pay | Admitting: Psychiatry

## 2016-07-10 VITALS — BP 126/77 | HR 71 | Temp 97.9°F | Wt 218.0 lb

## 2016-07-10 DIAGNOSIS — F411 Generalized anxiety disorder: Secondary | ICD-10-CM

## 2016-07-10 DIAGNOSIS — F331 Major depressive disorder, recurrent, moderate: Secondary | ICD-10-CM

## 2016-07-10 MED ORDER — SERTRALINE HCL 50 MG PO TABS: 50.0000 mg | ORAL_TABLET | Freq: Every day | ORAL | 1 refills | Status: DC

## 2016-07-10 MED ORDER — ALPRAZOLAM 0.25 MG PO TABS: 0.2500 mg | ORAL_TABLET | Freq: Every day | ORAL | 2 refills | Status: DC | PRN

## 2016-07-10 MED ORDER — TRAZODONE HCL 100 MG PO TABS: 200.0000 mg | ORAL_TABLET | Freq: Every day | ORAL | 2 refills | Status: DC

## 2016-07-10 NOTE — Progress Notes (Signed)
Psychiatric MD Progress Note   Patient Identification: Bruce [name] MRN:  [id number] Date of Evaluation:  07/10/2016 Referral Source: [facility]  Chief Complaint:   Chief Complaint    Follow-up; Medication Refill     Visit Diagnosis:    ICD-9-CM ICD-10-CM   1. MDD (major depressive disorder), recurrent episode, moderate (HCC) 296.32 F33.1     History of Present Illness:   Pt  is a 71 year old male who was referred from the [facility]. Patient was seen for follow-up appointment. He reported that He was just diagnosed with colon cancer 2 months ago. He was depressed and anxious about the same. He reported that he is going to start radiation therapy from next week. He reported that he has been following with the [facility] and they have made up planned for him that he will be getting radiation for the next 1 month. He reported that he is feeling anxious and apprehensive and his son is going to help him. He reported that he is not able to sleep at night and becomes restless. We discussed about his medications. He is receptive to start taking antidepressant at this time as he feels that he is becoming more depressed. Patient currently denied having any suicidal homicidal ideations or plans. He reported that he is taking his medications as prescribed.     Associated Signs/Symptoms: Depression Symptoms:  fatigue, anxiety, loss of energy/fatigue, disturbed sleep, (Hypo) Manic Symptoms:  Impulsivity, Irritable Mood, Anxiety Symptoms:  Excessive Worry, Psychotic Symptoms:  denied PTSD Symptoms: Negative NA  Past Psychiatric History:  Patient has history of hallucinations from Singulair which was stopped. He has never been admitted to a psychiatric hospital. He denied any history of suicide attempts.  Previous Psychotropic Medications: Xanax   Substance Abuse History in the last 12 months:  No.  Consequences of Substance Abuse: Negative NA  Past Medical History:   Past Medical History:  Diagnosis Date  . Anginal pain ([place])    Left side if chest ,NTG  relieves chaes apin 12/01/13  . Anxiety   . Arthritis   . CHF (congestive heart failure) ([place])   . 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 Complication of anesthesia    wake up with a head ache  . COPD (chronic obstructive pulmonary disease) ([place])   . Coronary artery disease   . GERD (gastroesophageal reflux disease)   . Headache    related to sinus congestion  . History of blood transfusion   . History of kidney stones   . Hypertension   . Myocardial infarction [facility])    '09 AND '12  . Peripheral vascular disease ([place])   . Shortness of breath    With exertion .  Marland Kitchen UTI (urinary tract infection)    frequent UTI    Past Surgical History:  Procedure Laterality Date  . CARDIAC CATHETERIZATION    . CHOLECYSTECTOMY    . EXTERNAL FIXATION LEG Right 10/06/2013   Procedure: CLOSED REDUCTION RIGHT TIBIAL PLATEAU FRACTURE, EXTERNAL FIXATION RIGHT LEG, PLACEMENT OF WOUND VAC;  Surgeon: Rozanna Box, MD;  Location: [facility];  Service: Orthopedics;  Laterality: Right;  . FEMORAL-POPLITEAL BYPASS GRAFT Right 10/06/2013   Procedure: RIGHT POPLITEAL-POPLITEAL ARTERY BYPASS GRAFT;  Surgeon: Rosetta Posner, MD;  Location: [facility];  Service: Vascular;  Laterality: Right;  . FRACTURE SURGERY Right 2014  . HARDWARE REMOVAL Right 12/02/2013   Procedure: REMOVAL EXTERNAL FIXATION RIGHT LEG ;  Surgeon: Astrid Divine  Marcelino Scot, MD;  Location: [facility];  Service: Orthopedics;  Laterality: Right;  . HERNIA REPAIR Right 1990's  . I&D EXTREMITY Right 10/09/2013   Procedure: IRRIGATION AND DEBRIDEMENT RIGHT LEG, CLOSURE  OF WOUNDS, PLACEMENT OF WOUND VAC ON EACH SIDE OF LEG;  Surgeon: Rozanna Box, MD;  Location: [facility];  Service: Orthopedics;  Laterality: Right;  . IR NEPHROSTOMY PLACEMENT LEFT  06/19/2016  . IVC filter  2009   placed @ [facility]/ Removed in 2010.  . IVC Filter Removed    .  ligament leg Left   . NEPHROLITHOTOMY Left 06/19/2016   Procedure: NEPHROLITHOTOMY PERCUTANEOUS;  Surgeon: Hollice Espy, MD;  Location: [facility];  Service: Urology;  Laterality: Left;  . ORIF TIBIA FRACTURE Right 12/29/2014   Procedure: OPEN REDUCTION INTERNAL FIXATION (ORIF) RIGHT TIBIA FRACTURE, RIA VS ICBG;  Surgeon: Altamese Hemlock, MD;  Location: [facility];  Service: Orthopedics;  Laterality: Right;    Family Psychiatric History:  Denied    Family History:  Family History  Problem Relation Age of Onset  . Prostate cancer Brother     Social History:   Social History   Social History  . Marital status: Single    Spouse name: N/A  . Number of children: N/A  . Years of education: N/A   Social History Main Topics  . Smoking status: Current Some Day Smoker    Packs/day: 0.50    Years: 52.00  . Smokeless tobacco: Never Used  . Alcohol use No     Comment: Stopped 2009  . Drug use: No  . Sexual activity: Not Currently   Other Topics Concern  . None   Social History Narrative   ** Merged History Encounter **        Additional Social History:  Son lives with him. He is 43 yo. Self employed.  Married x 2.  Unemployed now.   Allergies:   Allergies  Allergen Reactions  . Adhesive [Tape] Other (See Comments)    After right leg fracture surgery, pt developed a large blister where tape was applied to his right leg. OK to use paper tape.  . Imdur [Isosorbide Dinitrate] Other (See Comments)    hallucinations  . Singulair [Montelukast Sodium] Other (See Comments)    Hallucinations     Metabolic Disorder Labs: No results found for: HGBA1C, MPG No results found for: PROLACTIN No results found for: CHOL, TRIG, HDL, CHOLHDL, VLDL, LDLCALC   Current Medications: Current Outpatient Prescriptions  Medication Sig Dispense Refill  . allopurinol (ZYLOPRIM) 100 MG tablet Take 100 mg by mouth at bedtime.    . ALPRAZolam (XANAX) 0.25 MG tablet Take 1 tablet (0.25 mg total) by  mouth daily as needed for anxiety. 30 tablet 2  . atorvastatin (LIPITOR) 80 MG tablet Take 80 mg by mouth at bedtime.     . benazepril (LOTENSIN) 20 MG tablet     . benazepril-hydrochlorthiazide (LOTENSIN HCT) 20-12.5 MG per tablet Take 1 tablet by mouth daily.    Marland Kitchen CALCIUM CITRATE PO Take 250 mg by mouth at bedtime.     . Cholecalciferol (VITAMIN D3 PO) Take 1,000 tablets by mouth daily.     . DOCOSAHEXAENOIC ACID PO Take by mouth.    . docusate sodium (COLACE) 100 MG capsule Take 100 mg by mouth daily as needed for mild constipation.    . docusate sodium (COLACE) 100 MG capsule Take 1 capsule (100 mg total) by mouth 2 (two) times daily. 60 capsule 0  . fluticasone (  FLONASE) 50 MCG/ACT nasal spray Place 1 spray into both nostrils daily.    . furosemide (LASIX) 20 MG tablet Take 20 mg by mouth daily.    Marland Kitchen HYDROcodone-acetaminophen (NORCO/VICODIN) 5-325 MG tablet Take 1-2 tablets by mouth every 6 (six) hours as needed for moderate pain. 15 tablet 0  . hydrOXYzine (ATARAX/VISTARIL) 25 MG tablet Take 25 mg by mouth at bedtime as needed. sleep    . loratadine (CLARITIN) 10 MG tablet Take 10 mg by mouth at bedtime.     . metoprolol succinate (TOPROL-XL) 50 MG 24 hr tablet Take 1 tablet (50 mg total) by mouth daily. (Patient taking differently: Take 50 mg by mouth at bedtime. ) 90 tablet 0  . Multiple Vitamin (MULTIVITAMIN) tablet Take 1 tablet by mouth daily. For Men    . nitroGLYCERIN (NITROSTAT) 0.4 MG SL tablet Place 0.4 mg under the tongue every 5 (five) minutes as needed for chest pain.     . Omega-3 Fatty Acids (FISH OIL PO) Take 1 capsule by mouth daily.    Marland Kitchen omeprazole (PRILOSEC) 40 MG capsule Take 40 mg by mouth daily.    . potassium chloride (K-DUR) 10 MEQ tablet     . Probiotic Product (PROBIOTIC FORMULA PO) Take 1 Dose by mouth at bedtime.    . ranolazine (RANEXA) 1000 MG SR tablet Take 1,000 mg by mouth every morning.     . rivaroxaban (XARELTO) 20 MG TABS tablet Take 20 mg by mouth  daily with supper.    . traZODone (DESYREL) 100 MG tablet Take 1 tablet (100 mg total) by mouth at bedtime. 30 tablet 2   No current facility-administered medications for this visit.     Neurologic: Headache: No Seizure: No Paresthesias:No  Musculoskeletal: Strength & Muscle Tone: within normal limits Gait & Station: normal Patient leans: N/A  Psychiatric Specialty Exam: ROS  Blood pressure 126/77, pulse 71, temperature 97.9 F (36.6 C), temperature source Oral, weight 218 lb 0.2 oz (98.9 kg).Body mass index is 28.76 kg/m.  General Appearance: Casual  Eye Contact:  Fair  Speech:  Clear and Coherent  Volume:  Normal  Mood:  Anxious and Depressed  Affect:  Appropriate  Thought Process:  Coherent  Orientation:  Full (Time, Place, and Person)  Thought Content:  WDL  Suicidal Thoughts:  No  Homicidal Thoughts:  No  Memory:  Immediate;   Fair Recent;   Fair Remote;   Fair  Judgement:  Fair  Insight:  Fair  Psychomotor Activity:  Normal  Concentration:  Concentration: Fair and Attention Span: Fair  Recall:  [employer] of Knowledge:Fair  Language: Fair  Akathisia:  No  Handed:  Right  AIMS (if indicated):    Assets:  Communication Skills Desire for Improvement Physical Health Social Support  ADL's:  Intact  Cognition: WNL  Sleep:      Treatment Plan Summary: Medication management    Discussed with patient about his medications treatment risks benefits and alternatives. Continue  Xanax 0.25 mg by mouth daily when necessary to continue with his anxiety and he agreed with the plan. He will also be given trazodone 200  mg by mouth daily at bedtime. I will start him on Zoloft 50 g daily for his depressive symptoms. Supportive therapy done  Discussed with him about side effects of medication and he demonstrated understanding. Follow-up in 1  month or earlier depending on his symptoms Advised patient that if he is unable to follow up his appointment he needed to  contact our office and he demonstrated understanding.    More than 50% of the time spent in psychoeducation, counseling and coordination of care.     This note was generated in part or whole with voice recognition software. Voice regonition is usually quite accurate but there are transcription errors that can and very often do occur. I apologize for any typographical errors that were not detected and corrected.     Rainey Pines, MD 5/7/201810:02 AM

## 2016-07-11 ENCOUNTER — Other Ambulatory Visit: Payer: Self-pay | Admitting: Radiology

## 2016-07-11 DIAGNOSIS — C61 Malignant neoplasm of prostate: Secondary | ICD-10-CM

## 2016-07-12 ENCOUNTER — Telehealth: Payer: Self-pay | Admitting: Radiology

## 2016-07-12 NOTE — Telephone Encounter (Signed)
Notified pt of prostate volume study scheduled with Dr Erlene Quan on 08/15/16, pre-admit testing appt on 08/29/16 @9 :00 & I-125 prostate seed implants scheduled 09/12/16. Pt requests reminder call closer to time of procedures.

## 2016-07-13 ENCOUNTER — Other Ambulatory Visit: Payer: Self-pay | Admitting: *Deleted

## 2016-07-13 DIAGNOSIS — C61 Malignant neoplasm of prostate: Secondary | ICD-10-CM

## 2016-07-13 HISTORY — DX: Malignant neoplasm of prostate: C61

## 2016-07-17 ENCOUNTER — Ambulatory Visit
Admission: RE | Admit: 2016-07-17 | Discharge: 2016-07-17 | Disposition: A | Discharge status: Home / Self Care | Payer: Medicare HMO | Source: Ambulatory Visit | Attending: Radiation Oncology | Admitting: Radiation Oncology

## 2016-07-18 ENCOUNTER — Ambulatory Visit
Admission: RE | Admit: 2016-07-18 | Discharge: 2016-07-18 | Disposition: A | Discharge status: Home / Self Care | Payer: Medicare HMO | Source: Ambulatory Visit | Attending: Radiation Oncology | Admitting: Radiation Oncology

## 2016-07-18 DIAGNOSIS — C61 Malignant neoplasm of prostate: Secondary | ICD-10-CM | POA: Diagnosis not present

## 2016-07-19 ENCOUNTER — Ambulatory Visit: Payer: Self-pay | Admitting: Urology

## 2016-07-19 ENCOUNTER — Ambulatory Visit
Admission: RE | Admit: 2016-07-19 | Discharge: 2016-07-19 | Disposition: A | Discharge status: Home / Self Care | Payer: Medicare HMO | Source: Ambulatory Visit | Attending: Radiation Oncology | Admitting: Radiation Oncology

## 2016-07-19 DIAGNOSIS — C61 Malignant neoplasm of prostate: Secondary | ICD-10-CM | POA: Diagnosis not present

## 2016-07-20 ENCOUNTER — Ambulatory Visit
Admission: RE | Admit: 2016-07-20 | Discharge: 2016-07-20 | Disposition: A | Discharge status: Home / Self Care | Payer: Medicare HMO | Source: Ambulatory Visit | Attending: Radiation Oncology | Admitting: Radiation Oncology

## 2016-07-20 ENCOUNTER — Ambulatory Visit
Admission: RE | Admit: 2016-07-20 | Discharge: 2016-07-20 | Disposition: A | Discharge status: Home / Self Care | Payer: Medicare HMO | Source: Ambulatory Visit | Attending: Urology | Admitting: Urology

## 2016-07-20 DIAGNOSIS — N400 Enlarged prostate without lower urinary tract symptoms: Secondary | ICD-10-CM | POA: Diagnosis not present

## 2016-07-20 DIAGNOSIS — N200 Calculus of kidney: Secondary | ICD-10-CM | POA: Diagnosis not present

## 2016-07-20 DIAGNOSIS — C61 Malignant neoplasm of prostate: Secondary | ICD-10-CM | POA: Diagnosis not present

## 2016-07-21 ENCOUNTER — Ambulatory Visit
Admission: RE | Admit: 2016-07-21 | Discharge: 2016-07-21 | Disposition: A | Discharge status: Home / Self Care | Payer: Medicare HMO | Source: Ambulatory Visit | Attending: Radiation Oncology | Admitting: Radiation Oncology

## 2016-07-21 DIAGNOSIS — C61 Malignant neoplasm of prostate: Secondary | ICD-10-CM | POA: Diagnosis not present

## 2016-07-23 ENCOUNTER — Ambulatory Visit: Payer: Medicare HMO

## 2016-07-24 ENCOUNTER — Ambulatory Visit
Admission: RE | Admit: 2016-07-24 | Discharge: 2016-07-24 | Disposition: A | Discharge status: Home / Self Care | Payer: Medicare HMO | Source: Ambulatory Visit | Attending: Radiation Oncology | Admitting: Radiation Oncology

## 2016-07-24 ENCOUNTER — Ambulatory Visit: Payer: Medicare HMO

## 2016-07-24 DIAGNOSIS — C61 Malignant neoplasm of prostate: Secondary | ICD-10-CM | POA: Diagnosis not present

## 2016-07-25 ENCOUNTER — Ambulatory Visit: Payer: Medicare HMO | Admitting: Urology

## 2016-07-25 ENCOUNTER — Ambulatory Visit
Admission: RE | Admit: 2016-07-25 | Discharge: 2016-07-25 | Disposition: A | Discharge status: Home / Self Care | Payer: Medicare HMO | Source: Ambulatory Visit | Attending: Radiation Oncology | Admitting: Radiation Oncology

## 2016-07-25 ENCOUNTER — Ambulatory Visit
Admission: RE | Admit: 2016-07-25 | Discharge: 2016-07-25 | Disposition: A | Discharge status: Home / Self Care | Payer: Medicare HMO | Source: Ambulatory Visit | Attending: Urology | Admitting: Urology

## 2016-07-25 DIAGNOSIS — N200 Calculus of kidney: Secondary | ICD-10-CM | POA: Diagnosis present

## 2016-07-25 DIAGNOSIS — C61 Malignant neoplasm of prostate: Secondary | ICD-10-CM | POA: Diagnosis not present

## 2016-07-26 ENCOUNTER — Ambulatory Visit
Admission: RE | Admit: 2016-07-26 | Discharge: 2016-07-26 | Disposition: A | Discharge status: Home / Self Care | Payer: Medicare HMO | Source: Ambulatory Visit | Attending: Radiation Oncology | Admitting: Radiation Oncology

## 2016-07-26 DIAGNOSIS — C61 Malignant neoplasm of prostate: Secondary | ICD-10-CM | POA: Diagnosis not present

## 2016-07-27 ENCOUNTER — Inpatient Hospital Stay: Payer: Medicare HMO

## 2016-07-27 ENCOUNTER — Ambulatory Visit
Admission: RE | Admit: 2016-07-27 | Discharge: 2016-07-27 | Disposition: A | Discharge status: Home / Self Care | Payer: Medicare HMO | Source: Ambulatory Visit | Attending: Radiation Oncology | Admitting: Radiation Oncology

## 2016-07-27 DIAGNOSIS — C61 Malignant neoplasm of prostate: Secondary | ICD-10-CM | POA: Diagnosis not present

## 2016-07-27 LAB — CBC
HCT: 37.1 % — ABNORMAL LOW (ref 40.0–52.0)
Hemoglobin: 12.9 g/dL — ABNORMAL LOW (ref 13.0–18.0)
MCH: 32.5 pg (ref 26.0–34.0)
MCHC: 34.9 g/dL (ref 32.0–36.0)
MCV: 93.3 fL (ref 80.0–100.0)
Platelets: 200 10*3/uL (ref 150–440)
RBC: 3.98 MIL/uL — ABNORMAL LOW (ref 4.40–5.90)
RDW: 13.9 % (ref 11.5–14.5)
WBC: 3.5 10*3/uL — AB (ref 3.8–10.6)

## 2016-07-28 ENCOUNTER — Encounter: Payer: Self-pay | Admitting: Urology

## 2016-07-28 ENCOUNTER — Ambulatory Visit (INDEPENDENT_AMBULATORY_CARE_PROVIDER_SITE_OTHER): Payer: Medicare HMO | Admitting: Urology

## 2016-07-28 ENCOUNTER — Ambulatory Visit
Admission: RE | Admit: 2016-07-28 | Discharge: 2016-07-28 | Disposition: A | Discharge status: Home / Self Care | Payer: Medicare HMO | Source: Ambulatory Visit | Attending: Radiation Oncology | Admitting: Radiation Oncology

## 2016-07-28 VITALS — BP 124/77 | HR 68 | Ht 73.0 in | Wt 210.0 lb

## 2016-07-28 DIAGNOSIS — C61 Malignant neoplasm of prostate: Secondary | ICD-10-CM

## 2016-07-28 DIAGNOSIS — N200 Calculus of kidney: Secondary | ICD-10-CM

## 2016-07-28 NOTE — Progress Notes (Signed)
07/28/2016 8:52 AM   Bruce [name] 1945-04-08 [id number]  Referring provider: Danelle Berry, NP [address] [place], [place] [address]  Chief Complaint  Patient presents with  . 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  Prostate cancer He initially underwent prostate biopsy secondary to a PSA of 13.5 on 02/20/2016.   Repeat PSA was 18.5 on 04/06/2016.  Rectal exam was enlarged without nodules.  TRUS volume 56 g with intravesical protrusion of the median lobe (on cysto/ CT).  Prostate biopsy pathology shows Gleason 4+3 and 3+3 involving 4 of 12 cores, all on the right side.  Specifically, the right lateral base, the core comprises of 70% of Gleason 4 tumor involving up to 94% of the tissue.  CT abdomen pelvis with and without contrast on 05/09/2016 shows no evidence of lymphadenopathy or metastatic disease, although small mesenteric and retroperitoneal lymph nodes were noted but not reaching pathologic size.    Bone scan showed no evidence of metastatic disease.  At baseline, he has minimal urinary complaints. He has a decent stream without urgency or frequency. He is unsure if he has a history of erectile dysfunction, has not gotten an erection and many years, is not currently sexually active, and is not worried about his erectile status.  He is elected to proceed with external beam radiation with brachytherapy boost by Dr. Donella Stade.  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  He denies knowing about having a kidney stone prior to this. Review of previous older images including a renal ultrasound dating back to 2011 do show calcification within the left kidney, measuring 1.6 cm at that time.  Postoperatively, he has had no issues. He had drainage from his left flank incision  for a few days afterwards but this is since result. No significant flank pain, dysuria, or hematuria. He feels overall well.  Stone analysis shows 90% calcium oxalate monohydrate, 10% calcium phosphate.  Follow-up KUB and renal ultrasound today unremarkable, no evidence of residual stones or obstruction.   Past medical history significant for CAD with MI, COPD, and peripheral vascular disease.    PMH: Past Medical History:  Diagnosis Date  . Anginal pain ([place])    Left side if chest ,NTG  relieves chaes apin 12/01/13  . Anxiety   . Arthritis   . Cancer [facility])    colon cancer  . CHF (congestive heart failure) ([place])   . 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 Complication of anesthesia    wake up with a head ache  . COPD (chronic obstructive pulmonary disease) ([place])   . Coronary artery disease   . GERD (gastroesophageal reflux disease)   . Headache    related to sinus congestion  . History of blood transfusion   . History of kidney stones   . Hypertension   . Myocardial infarction [facility])    '09 AND '12  . Peripheral vascular disease ([place])   . Shortness of breath    With exertion .  Marland Kitchen UTI (urinary tract infection)    frequent UTI    Surgical History: Past Surgical History:  Procedure Laterality Date  . CARDIAC CATHETERIZATION    . CHOLECYSTECTOMY    . EXTERNAL FIXATION LEG Right 10/06/2013   Procedure: CLOSED REDUCTION RIGHT TIBIAL  PLATEAU FRACTURE, EXTERNAL FIXATION RIGHT LEG, PLACEMENT OF WOUND VAC;  Surgeon: Rozanna Box, MD;  Location: [facility];  Service: Orthopedics;  Laterality: Right;  . FEMORAL-POPLITEAL BYPASS GRAFT Right 10/06/2013   Procedure: RIGHT POPLITEAL-POPLITEAL ARTERY BYPASS GRAFT;  Surgeon: Rosetta Posner, MD;  Location: [facility];  Service: Vascular;  Laterality: Right;  . FRACTURE SURGERY Right 2014  . HARDWARE REMOVAL Right 12/02/2013   Procedure: REMOVAL EXTERNAL FIXATION RIGHT LEG ;  Surgeon: Rozanna Box, MD;   Location: [facility];  Service: Orthopedics;  Laterality: Right;  . HERNIA REPAIR Right 1990's  . I&D EXTREMITY Right 10/09/2013   Procedure: IRRIGATION AND DEBRIDEMENT RIGHT LEG, CLOSURE  OF WOUNDS, PLACEMENT OF WOUND VAC ON EACH SIDE OF LEG;  Surgeon: Rozanna Box, MD;  Location: [facility];  Service: Orthopedics;  Laterality: Right;  . IR NEPHROSTOMY PLACEMENT LEFT  06/19/2016  . IVC filter  2009   placed @ [facility]/ Removed in 2010.  . IVC Filter Removed    . ligament leg Left   . NEPHROLITHOTOMY Left 06/19/2016   Procedure: NEPHROLITHOTOMY PERCUTANEOUS;  Surgeon: Hollice Espy, MD;  Location: [facility];  Service: Urology;  Laterality: Left;  . ORIF TIBIA FRACTURE Right 12/29/2014   Procedure: OPEN REDUCTION INTERNAL FIXATION (ORIF) RIGHT TIBIA FRACTURE, RIA VS ICBG;  Surgeon: Altamese [place], MD;  Location: [facility];  Service: Orthopedics;  Laterality: Right;    Home Medications:  Allergies as of 07/28/2016      Reactions   Adhesive [tape] Other (See Comments)   After right leg fracture surgery, pt developed a large blister where tape was applied to his right leg. OK to use paper tape.   Imdur [isosorbide Dinitrate] Other (See Comments)   hallucinations   Singulair [montelukast Sodium] Other (See Comments)   Hallucinations      Medication List       Accurate as of 07/28/16 11:59 PM. Always use your most recent med list.          allopurinol 100 MG tablet Commonly known as:  ZYLOPRIM Take 100 mg by mouth at bedtime.   ALPRAZolam 0.25 MG tablet Commonly known as:  XANAX Take 1 tablet (0.25 mg total) by mouth daily as needed for anxiety.   atorvastatin 80 MG tablet Commonly known as:  LIPITOR Take 80 mg by mouth at bedtime.   benazepril-hydrochlorthiazide 20-12.5 MG tablet Commonly known as:  LOTENSIN HCT Take 1 tablet by mouth daily.   CALCIUM CITRATE PO Take 250 mg by mouth at bedtime.   DOCOSAHEXAENOIC ACID PO Take by mouth.   FISH OIL PO Take 1 capsule by mouth daily.     fluticasone 50 MCG/ACT nasal spray Commonly known as:  FLONASE Place 1 spray into both nostrils daily.   furosemide 20 MG tablet Commonly known as:  LASIX Take 20 mg by mouth daily.   hydrOXYzine 25 MG tablet Commonly known as:  ATARAX/VISTARIL Take 25 mg by mouth at bedtime as needed. sleep   loratadine 10 MG tablet Commonly known as:  CLARITIN Take 10 mg by mouth at bedtime.   metoprolol succinate 50 MG 24 hr tablet Commonly known as:  TOPROL-XL Take 1 tablet (50 mg total) by mouth daily.   multivitamin tablet Take 1 tablet by mouth daily. For Men   nitroGLYCERIN 0.4 MG SL tablet Commonly known as:  NITROSTAT Place 0.4 mg under the tongue every 5 (five) minutes as needed for chest pain.   omeprazole 40 MG capsule Commonly known as:  PRILOSEC Take 40 mg by mouth daily.   potassium chloride 10 MEQ tablet Commonly known as:  K-DUR   PROBIOTIC FORMULA PO Take 1 Dose by mouth at bedtime.   RANEXA 1000 MG SR tablet Generic drug:  ranolazine Take 1,000 mg by mouth every morning.   rivaroxaban 20 MG Tabs tablet Commonly known as:  XARELTO Take 20 mg by mouth daily with supper.   sertraline 50 MG tablet Commonly known as:  ZOLOFT Take 1 tablet (50 mg total) by mouth daily.   traZODone 100 MG tablet Commonly known as:  DESYREL Take 2 tablets (200 mg total) by mouth at bedtime.   VITAMIN D3 PO Take 1,000 tablets by mouth daily.       Allergies:  Allergies  Allergen Reactions  . Adhesive [Tape] Other (See Comments)    After right leg fracture surgery, pt developed a large blister where tape was applied to his right leg. OK to use paper tape.  . Imdur [Isosorbide Dinitrate] Other (See Comments)    hallucinations  . Singulair [Montelukast Sodium] Other (See Comments)    Hallucinations     Family History: Family History  Problem Relation Age of Onset  . Prostate cancer Brother     Social History:  reports that he has been smoking.  He has a 26.00  pack-year smoking history. He has never used smokeless tobacco. He reports that he does not drink alcohol or use drugs.  ROS: UROLOGY Frequent Urination?: No Hard to postpone urination?: No Burning/pain with urination?: No Get up at night to urinate?: No Leakage of urine?: No Urine stream starts and stops?: No Trouble starting stream?: No Do you have to strain to urinate?: No Blood in urine?: No Urinary tract infection?: No Sexually transmitted disease?: No Injury to kidneys or bladder?: No Painful intercourse?: No Weak stream?: No Erection problems?: No Penile pain?: No  Gastrointestinal Nausea?: No Vomiting?: No Indigestion/heartburn?: No Diarrhea?: No Constipation?: No  Constitutional Fever: No Night sweats?: No Weight loss?: No Fatigue?: No  Skin Skin rash/lesions?: No Itching?: No  Eyes Blurred vision?: No Double vision?: No  Ears/Nose/Throat Sore throat?: No Sinus problems?: No  Hematologic/Lymphatic Swollen glands?: No Easy bruising?: No  Cardiovascular Leg swelling?: No Chest pain?: No  Respiratory Cough?: No Shortness of breath?: No  Endocrine Excessive thirst?: No  Musculoskeletal Back pain?: No Joint pain?: No  Neurological Headaches?: No Dizziness?: No  Psychologic Depression?: No Anxiety?: No  Physical Exam: BP 124/77   Pulse 68   Ht 6\' 1"  (1.854 m)   Wt 210 lb (95.3 kg)   BMI 27.71 kg/m   Constitutional:  Alert and oriented, No acute distress. HEENT: [place] AT, moist mucus membranes.  Trachea midline, no masses. Cardiovascular: No clubbing, cyanosis, or edema. Respiratory: Normal respiratory effort, no increased work of breathing. GI: Abdomen is soft, nontender, nondistended, no abdominal masses GU: No CVA tenderness. Left flank incision well-healed scar, approximately 1.5 cm. Skin: No rashes, bruises or suspicious lesions. Neurologic: Grossly intact, no focal deficits, moving all 4 extremities. Psychiatric: Normal mood  and affect.  Laboratory Data: Lab Results  Component Value Date   WBC 3.5 (L) 07/27/2016   HGB 12.9 (L) 07/27/2016   HCT 37.1 (L) 07/27/2016   MCV 93.3 07/27/2016   PLT 200 07/27/2016    Lab Results  Component Value Date   CREATININE 1.44 (H) 06/20/2016   Component     Latest Ref Rng & Units 04/06/2016  PSA     0.0 - 4.0  ng/mL 18.5 (H)    Urinalysis N/a  Pertinent Imaging: CLINICAL DATA:  History of multiple kidney stones.  EXAM: ABDOMEN - 1 VIEW  COMPARISON:  05/09/2016.  FINDINGS: The previously noted left renal calculi are no longer visualized. Right upper quadrant cholecystectomy clips noted. Normal bowel gas pattern. No dilated loops  IMPRESSION: Previously noted left renal calculi are no longer visualized.   Electronically Signed   By: Kerby Moors M.D.   On: 07/25/2016 14:58  CLINICAL DATA:  Staghorn calculus.  History of left nephrolithotomy.  EXAM: RENAL / URINARY TRACT ULTRASOUND COMPLETE  COMPARISON:  Spot images 416 2018.  CT 05/19/2016.  FINDINGS: Right Kidney:  Length: 9.9 cm. Renal cortical thinning . Echogenicity within normal limits. No mass or hydronephrosis visualized.  Left Kidney:  Length: 10.4 cm. Renal cortical thinning. Echogenicity within normal limits. No mass or hydronephrosis visualized. Previously identified low left staghorn calculus no longer visualized.  Bladder:  Bladder wall measures 5.3 mm. Slight thickening may be related to nondistention. A process such as cystitis or bladder wall hypertrophy cannot be excluded. The prostate is enlarged.  IMPRESSION: 1. Previously identified left staghorn calculus no longer identified. Bilateral renal cortical thinning. No acute renal abnormality. No hydronephrosis.  2. Bladder is nondistended. Mild bladder wall thickening noted. This may be from lack of distention however a process such as cystitis or bladder wall hypertrophy cannot be excluded. The  prostate is enlarged.   Electronically Signed   By: Marcello Moores  Register   On: 07/20/2016 13:34  KUB and renal ultrasound imaging were personally reviewed today and with the patient.   Assessment & Plan:    1. Prostate cancer ([place]) Intermediate (borderline high) risk Gleason 4+3 prostate cancer on the right, PSA 18 without evidence of metastatic disease on CT abdomen pelvis or bone scan.  MSK nomogram 10/15 year progression free survival 49%/34%, OCD 25%, ECE 70%, LN 9% and 9%, and SV 7%.   Scheduled for external beam radiation with brachytherapy boost  2. Kidney stone on left side S/p successful left ESWL- no post op issues Stone analysis primarily calcium oxalate monohydrate We discussed general stone prevention techniques including drinking plenty water with goal of producing 2.5 L urine daily, increased citric acid intake, avoidance of high oxalate containing foods, and decreased salt intake.  Information about dietary recommendations given today.   We will arrange for appropriate follow up at time of seed placement  Hollice Espy, MD  [facility] [address], [place], [place] [address] [phone]

## 2016-08-01 ENCOUNTER — Other Ambulatory Visit: Payer: Self-pay

## 2016-08-01 ENCOUNTER — Ambulatory Visit
Admission: RE | Admit: 2016-08-01 | Discharge: 2016-08-01 | Disposition: A | Discharge status: Home / Self Care | Payer: Medicare HMO | Source: Ambulatory Visit | Attending: Radiation Oncology | Admitting: Radiation Oncology

## 2016-08-01 DIAGNOSIS — C61 Malignant neoplasm of prostate: Secondary | ICD-10-CM | POA: Diagnosis not present

## 2016-08-01 NOTE — Telephone Encounter (Signed)
Reminder call to pt to notify of prostate volume study scheduled 08/15/16 with arrival time to [facility] at 7:10am & advised pt to be npo after mn. Pt voices understanding.

## 2016-08-02 ENCOUNTER — Ambulatory Visit: Payer: Medicare HMO

## 2016-08-02 ENCOUNTER — Ambulatory Visit: Admission: RE | Admit: 2016-08-02 | Payer: Medicare HMO | Source: Ambulatory Visit

## 2016-08-03 ENCOUNTER — Ambulatory Visit
Admission: RE | Admit: 2016-08-03 | Discharge: 2016-08-03 | Disposition: A | Discharge status: Home / Self Care | Payer: Medicare HMO | Source: Ambulatory Visit | Attending: Radiation Oncology | Admitting: Radiation Oncology

## 2016-08-03 DIAGNOSIS — C61 Malignant neoplasm of prostate: Secondary | ICD-10-CM | POA: Diagnosis not present

## 2016-08-04 ENCOUNTER — Ambulatory Visit
Admission: RE | Admit: 2016-08-04 | Discharge: 2016-08-04 | Disposition: A | Discharge status: Home / Self Care | Payer: Medicare HMO | Source: Ambulatory Visit | Attending: Radiation Oncology | Admitting: Radiation Oncology

## 2016-08-04 DIAGNOSIS — Z87442 Personal history of urinary calculi: Secondary | ICD-10-CM | POA: Diagnosis not present

## 2016-08-04 DIAGNOSIS — I209 Angina pectoris, unspecified: Secondary | ICD-10-CM | POA: Diagnosis not present

## 2016-08-04 DIAGNOSIS — Z7901 Long term (current) use of anticoagulants: Secondary | ICD-10-CM | POA: Diagnosis not present

## 2016-08-04 DIAGNOSIS — F419 Anxiety disorder, unspecified: Secondary | ICD-10-CM | POA: Diagnosis not present

## 2016-08-04 DIAGNOSIS — F17210 Nicotine dependence, cigarettes, uncomplicated: Secondary | ICD-10-CM | POA: Diagnosis not present

## 2016-08-04 DIAGNOSIS — I252 Old myocardial infarction: Secondary | ICD-10-CM | POA: Diagnosis not present

## 2016-08-04 DIAGNOSIS — I509 Heart failure, unspecified: Secondary | ICD-10-CM | POA: Diagnosis not present

## 2016-08-04 DIAGNOSIS — J449 Chronic obstructive pulmonary disease, unspecified: Secondary | ICD-10-CM | POA: Diagnosis not present

## 2016-08-04 DIAGNOSIS — C61 Malignant neoplasm of prostate: Secondary | ICD-10-CM | POA: Diagnosis present

## 2016-08-04 DIAGNOSIS — I2510 Atherosclerotic heart disease of native coronary artery without angina pectoris: Secondary | ICD-10-CM | POA: Diagnosis not present

## 2016-08-04 DIAGNOSIS — K219 Gastro-esophageal reflux disease without esophagitis: Secondary | ICD-10-CM | POA: Diagnosis not present

## 2016-08-04 DIAGNOSIS — Z8781 Personal history of (healed) traumatic fracture: Secondary | ICD-10-CM | POA: Diagnosis not present

## 2016-08-04 DIAGNOSIS — I10 Essential (primary) hypertension: Secondary | ICD-10-CM | POA: Diagnosis not present

## 2016-08-04 DIAGNOSIS — R0602 Shortness of breath: Secondary | ICD-10-CM | POA: Diagnosis not present

## 2016-08-04 DIAGNOSIS — I739 Peripheral vascular disease, unspecified: Secondary | ICD-10-CM | POA: Diagnosis not present

## 2016-08-04 DIAGNOSIS — Z510 Encounter for antineoplastic radiation therapy: Secondary | ICD-10-CM | POA: Diagnosis not present

## 2016-08-04 DIAGNOSIS — Z8041 Family history of malignant neoplasm of ovary: Secondary | ICD-10-CM | POA: Diagnosis not present

## 2016-08-04 DIAGNOSIS — Z87440 Personal history of urinary (tract) infections: Secondary | ICD-10-CM | POA: Diagnosis not present

## 2016-08-04 DIAGNOSIS — M129 Arthropathy, unspecified: Secondary | ICD-10-CM | POA: Diagnosis not present

## 2016-08-04 DIAGNOSIS — Z79899 Other long term (current) drug therapy: Secondary | ICD-10-CM | POA: Diagnosis not present

## 2016-08-07 ENCOUNTER — Ambulatory Visit
Admission: RE | Admit: 2016-08-07 | Discharge: 2016-08-07 | Disposition: A | Discharge status: Home / Self Care | Payer: Medicare HMO | Source: Ambulatory Visit | Attending: Radiation Oncology | Admitting: Radiation Oncology

## 2016-08-07 DIAGNOSIS — C61 Malignant neoplasm of prostate: Secondary | ICD-10-CM | POA: Diagnosis not present

## 2016-08-08 ENCOUNTER — Ambulatory Visit
Admission: RE | Admit: 2016-08-08 | Discharge: 2016-08-08 | Disposition: A | Discharge status: Home / Self Care | Payer: Medicare HMO | Source: Ambulatory Visit | Attending: Radiation Oncology | Admitting: Radiation Oncology

## 2016-08-08 ENCOUNTER — Other Ambulatory Visit: Payer: Self-pay | Admitting: *Deleted

## 2016-08-08 DIAGNOSIS — C61 Malignant neoplasm of prostate: Secondary | ICD-10-CM | POA: Diagnosis not present

## 2016-08-08 MED ORDER — TAMSULOSIN HCL 0.4 MG PO CAPS: 0.4000 mg | ORAL_CAPSULE | Freq: Every day | ORAL | 6 refills | Status: DC

## 2016-08-09 ENCOUNTER — Ambulatory Visit: Payer: Medicare HMO | Admitting: Psychiatry

## 2016-08-09 ENCOUNTER — Ambulatory Visit
Admission: RE | Admit: 2016-08-09 | Discharge: 2016-08-09 | Disposition: A | Discharge status: Home / Self Care | Payer: Medicare HMO | Source: Ambulatory Visit | Attending: Radiation Oncology | Admitting: Radiation Oncology

## 2016-08-09 DIAGNOSIS — C61 Malignant neoplasm of prostate: Secondary | ICD-10-CM | POA: Diagnosis not present

## 2016-08-10 ENCOUNTER — Inpatient Hospital Stay: Payer: Medicare HMO | Attending: Radiation Oncology

## 2016-08-10 ENCOUNTER — Ambulatory Visit
Admission: RE | Admit: 2016-08-10 | Discharge: 2016-08-10 | Disposition: A | Discharge status: Home / Self Care | Payer: Medicare HMO | Source: Ambulatory Visit | Attending: Radiation Oncology | Admitting: Radiation Oncology

## 2016-08-10 DIAGNOSIS — C61 Malignant neoplasm of prostate: Secondary | ICD-10-CM | POA: Diagnosis not present

## 2016-08-11 ENCOUNTER — Ambulatory Visit
Admission: RE | Admit: 2016-08-11 | Discharge: 2016-08-11 | Disposition: A | Discharge status: Home / Self Care | Payer: Medicare HMO | Source: Ambulatory Visit | Attending: Radiation Oncology | Admitting: Radiation Oncology

## 2016-08-11 DIAGNOSIS — C61 Malignant neoplasm of prostate: Secondary | ICD-10-CM | POA: Diagnosis not present

## 2016-08-14 ENCOUNTER — Ambulatory Visit
Admission: RE | Admit: 2016-08-14 | Discharge: 2016-08-14 | Disposition: A | Discharge status: Home / Self Care | Payer: Medicare HMO | Source: Ambulatory Visit | Attending: Radiation Oncology | Admitting: Radiation Oncology

## 2016-08-14 DIAGNOSIS — C61 Malignant neoplasm of prostate: Secondary | ICD-10-CM | POA: Diagnosis not present

## 2016-08-15 ENCOUNTER — Ambulatory Visit
Admission: RE | Admit: 2016-08-15 | Discharge: 2016-08-15 | Disposition: A | Discharge status: Home / Self Care | Payer: Medicare Other | Source: Ambulatory Visit | Attending: Radiation Oncology | Admitting: Radiation Oncology

## 2016-08-15 ENCOUNTER — Ambulatory Visit: Admit: 2016-08-15 | Payer: Medicare HMO | Admitting: Urology

## 2016-08-15 ENCOUNTER — Ambulatory Visit: Payer: Medicare HMO

## 2016-08-15 ENCOUNTER — Encounter: Payer: Self-pay | Admitting: Radiation Oncology

## 2016-08-15 VITALS — BP 131/78 | HR 59 | Temp 97.0°F | Wt 202.6 lb

## 2016-08-15 DIAGNOSIS — C61 Malignant neoplasm of prostate: Secondary | ICD-10-CM | POA: Diagnosis present

## 2016-08-15 DIAGNOSIS — Z510 Encounter for antineoplastic radiation therapy: Secondary | ICD-10-CM | POA: Diagnosis not present

## 2016-08-15 SURGERY — ULTRASOUND, PROSTATE, FOR VOLUME DETERMINATION
Anesthesia: Choice

## 2016-08-15 NOTE — Progress Notes (Signed)
Radiation Oncology Volume Study Note  Name: Bruce [name]   Date:   08/15/2016 MRN:  [id number] DOB: Jul 17, 1945    This 71 y.o. male presents to the clinic today for volume study for stage IIa adenocarcinoma the prostate.  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 We opted to do both external beam treatment as well as boost his prostate with I-125 interstitial implant. He is seen today for volume study..  COMPLICATIONS OF TREATMENT: none  FOLLOW UP COMPLIANCE: keeps appointments   PHYSICAL EXAM:  There were no vitals taken for this visit. Well-developed well-nourished patient in NAD. HEENT reveals PERLA, EOMI, discs not visualized.  Oral cavity is clear. No oral mucosal lesions are identified. Neck is clear without evidence of cervical or supraclavicular adenopathy. Lungs are clear to A&P. Cardiac examination is essentially unremarkable with regular rate and rhythm without murmur rub or thrill. Abdomen is benign with no organomegaly or masses noted. Motor sensory and DTR levels are equal and symmetric in the upper and lower extremities. Cranial nerves II through XII are grossly intact. Proprioception is intact. No peripheral adenopathy or edema is identified. No motor or sensory levels are noted. Crude visual fields are within normal range.  RADIOLOGY RESULTS: Volume study performed under ultrasound guidance  PLAN: Patient was taken to the cystoscopy suite in the OR. Patient was placed in the low lithotomy position. Foley catheter was placed. Trans-rectal ultrasound probe was inserted into the rectum and prostate seminal vesicles were visualized as well as bladder base. stepping images were performed on a 5 mm increments. Images will be placed in Brachy[facility]ision treatment planning system to  determine seed placement coordinates for eventual I-125 interstitial implant. Images will be reviewed with the physics and dosimetry staff for final quality approval. I personally was present for the volume study and assisted in delineation of contour volumes.  At the end of the procedure Foley catheter was removed, rectal ultrasound probe was removed. Patient tolerated his procedures extremely well with no side effects or complaints. Patient has given appointment for interstitial implant date. Consent was signed today as well as history and physical performed in preparation for his outpatient surgical implant.      Armstead Peaks., MD

## 2016-08-15 NOTE — H&P (Signed)
NEW PATIENT EVALUATION  Name: Bruce [name]  MRN: [id number]  Date:   08/15/2016     DOB: Feb 27, 1946   This 71 y.o. male patient presents to the clinic for ihistory and physical prior to I-125 interstitial implant for adenocarcinoma the prostate  REFERRING PHYSICIAN: Danelle Berry, NP  CHIEF COMPLAINT:  Chief Complaint  Patient presents with  . Prostate Cancer    DIAGNOSIS: The encounter diagnosis was Malignant neoplasm of prostate ([place]).   PREVIOUS INVESTIGATIONS:  Clinical notes reviewed Pathology report reviewed Volume study performed Patient is a 71 year old male who presented to his family doctor with a PSA of 18.5. He has a prostate volume of 56 g was noted to have transrectal ultrasound-guided biopsy a Gleason score of 7 (4+3) on for of 12 cores. CT scan of the abdomen pelvis in March 2018 showed no evidence of lymphadenopathy or metastatic disease. He has significant comorbidities including coronary artery disease with a prior MI COPD and peripheral vascular disease. He does take blood thinner and was cleared to come off of that for his prostate biopsy. He has very little lower urinary tract symptoms nocturia 1 no frequency or urgency.patient is undergone external beam radiation therapy to both his prostate and pelvic nodes. He is now seen for volume study as well as I-125 interstitial implant for boost.  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    PAST SURGICAL HISTORY:  Past Surgical History:  Procedure Laterality Date  . CARDIAC CATHETERIZATION    . CHOLECYSTECTOMY    . EXTERNAL FIXATION LEG Right 10/06/2013   Procedure: CLOSED REDUCTION RIGHT TIBIAL PLATEAU FRACTURE, EXTERNAL FIXATION RIGHT LEG, PLACEMENT OF WOUND VAC;  Surgeon: Rozanna Box, MD;  Location: [place];  Service: Orthopedics;  Laterality: Right;  . FEMORAL-POPLITEAL BYPASS GRAFT Right 10/06/2013   Procedure: RIGHT POPLITEAL-POPLITEAL ARTERY BYPASS GRAFT;  Surgeon: Rosetta Posner, MD;  Location: [place];  Service: Vascular;  Laterality: Right;  . FRACTURE SURGERY Right 2014  . HARDWARE REMOVAL Right 12/02/2013   Procedure: REMOVAL EXTERNAL FIXATION RIGHT LEG ;  Surgeon: Rozanna Box, MD;  Location: [place];  Service: Orthopedics;  Laterality: Right;  . HERNIA REPAIR Right 1990's  . I&D EXTREMITY Right 10/09/2013   Procedure: IRRIGATION AND DEBRIDEMENT RIGHT LEG, CLOSURE  OF WOUNDS, PLACEMENT OF WOUND VAC ON EACH SIDE OF LEG;  Surgeon: Rozanna Box, MD;  Location: [place];  Service: Orthopedics;  Laterality: Right;  . IR NEPHROSTOMY PLACEMENT LEFT  06/19/2016  . IVC filter  2009   placed @ [facility]/ Removed in 2010.  . IVC Filter Removed    . ligament leg Left   . NEPHROLITHOTOMY Left 06/19/2016   Procedure: NEPHROLITHOTOMY PERCUTANEOUS;  Surgeon: Hollice Espy, MD;  Location: [facility];  Service: Urology;  Laterality: Left;  . ORIF TIBIA FRACTURE Right 12/29/2014   Procedure: OPEN REDUCTION INTERNAL FIXATION (ORIF) RIGHT TIBIA FRACTURE, RIA VS ICBG;  Surgeon: Altamese Clackamas, MD;  Location: [place];  Service: Orthopedics;  Laterality: Right;    FAMILY HISTORY: family history includes Prostate cancer  in his brother.  SOCIAL HISTORY:  reports that he has been smoking.  He has a 26.00 pack-year smoking history. He has never used smokeless tobacco. He reports that he does not drink alcohol or use drugs.  ALLERGIES: Adhesive [tape]; Imdur  [isosorbide dinitrate]; and Singulair [montelukast sodium]  MEDICATIONS:  Current Outpatient Prescriptions  Medication Sig Dispense Refill  . allopurinol (ZYLOPRIM) 100 MG tablet Take 100 mg by mouth at bedtime.    . ALPRAZolam (XANAX) 0.25 MG tablet Take 1 tablet (0.25 mg total) by mouth daily as needed for anxiety. 30 tablet 2  . atorvastatin (LIPITOR) 80 MG tablet Take 80 mg by mouth at bedtime.     . benazepril (LOTENSIN) 20 MG tablet Take 1 tablet by mouth daily.    Marland Kitchen CALCIUM CITRATE PO Take 250 mg by mouth at bedtime.     . cholecalciferol (VITAMIN D) 1000 units tablet Take 1,000 tablets by mouth daily.     . DOCOSAHEXAENOIC ACID PO Take 1 capsule by mouth daily.     . fluticasone (FLONASE) 50 MCG/ACT nasal spray Place 1 spray into both nostrils daily.    . furosemide (LASIX) 20 MG tablet Take 20 mg by mouth daily.    . hydrOXYzine (VISTARIL) 25 MG capsule Take 1 capsule by mouth at bedtime as needed for sleep.    Marland Kitchen loratadine (CLARITIN) 10 MG tablet Take 10 mg by mouth at bedtime.     . metoprolol succinate (TOPROL-XL) 50 MG 24 hr tablet Take 1 tablet (50 mg total) by mouth daily. (Patient taking differently: Take 50 mg by mouth at bedtime. ) 90 tablet 0  . montelukast (SINGULAIR) 10 MG tablet Take 1 tablet by mouth every evening.    . Multiple Vitamin (MULTIVITAMIN) tablet Take 1 tablet by mouth daily. For Men    . nitroGLYCERIN (NITROSTAT) 0.4 MG SL tablet Place 0.4 mg under the tongue every 5 (five) minutes as needed for chest pain.     . Omega-3 Fatty Acids (FISH OIL PO) Take 1 capsule by mouth daily.    Marland Kitchen omeprazole (PRILOSEC) 40 MG capsule Take 40 mg by mouth daily.    . potassium chloride (K-DUR) 10 MEQ tablet Take 1 tablet by mouth daily.    . Probiotic Product (PROBIOTIC FORMULA PO) Take 1 capsule by mouth at bedtime.     . ranolazine (RANEXA) 1000 MG SR tablet Take 1,000 mg by mouth every morning.     . rivaroxaban (XARELTO) 20 MG TABS tablet Take 20 mg by mouth daily with  supper.    . sertraline (ZOLOFT) 50 MG tablet Take 1 tablet (50 mg total) by mouth daily. 30 tablet 1  . tamsulosin (FLOMAX) 0.4 MG CAPS capsule Take 1 capsule (0.4 mg total) by mouth daily after supper. 30 capsule 6  . traZODone (DESYREL) 100 MG tablet Take 2 tablets (200 mg total) by mouth at bedtime. 60 tablet 2   No current facility-administered medications for this encounter.     ECOG PERFORMANCE STATUS:  0 - Asymptomatic  REVIEW OF SYSTEMS:  Patient denies any weight loss, fatigue, weakness, fever, chills or night sweats. Patient denies any loss of vision, blurred vision. Patient denies any ringing  of the ears or hearing loss. No irregular heartbeat. Patient denies heart murmur or history of fainting. Patient denies any chest pain or pain radiating to her upper extremities. Patient denies any shortness of breath, difficulty breathing at night, cough or hemoptysis. Patient denies any swelling in the lower legs.  Patient denies any nausea vomiting, vomiting of blood, or coffee ground material in the vomitus. Patient denies any stomach pain. Patient states has had normal bowel movements no significant constipation or diarrhea. Patient denies any dysuria, hematuria or significant nocturia. Patient denies any problems walking, swelling in the joints or loss of balance. Patient denies any skin changes, loss of hair or loss of weight. Patient denies any excessive worrying or anxiety or significant depression. Patient denies any problems with insomnia. Patient denies excessive thirst, polyuria, polydipsia. Patient denies any swollen glands, patient denies easy bruising or easy bleeding. Patient denies any recent infections, allergies or URI. Patient "s visual fields have not changed significantly in recent time.    PHYSICAL EXAM: BP 131/78   Pulse (!) 59   Temp 97 F (36.1 C)   Wt 202 lb 9.6 oz (91.9 kg)   BMI 26.73 kg/m  Well-developed well-nourished patient in NAD. HEENT reveals PERLA, EOMI,  discs not visualized.  Oral cavity is clear. No oral mucosal lesions are identified. Neck is clear without evidence of cervical or supraclavicular adenopathy. Lungs are clear to A&P. Cardiac examination is essentially unremarkable with regular rate and rhythm without murmur rub or thrill. Abdomen is benign with no organomegaly or masses noted. Motor sensory and DTR levels are equal and symmetric in the upper and lower extremities. Cranial nerves II through XII are grossly intact. Proprioception is intact. No peripheral adenopathy or edema is identified. No motor or sensory levels are noted. Crude visual fields are within normal range.  LABORATORY DATA: pathology reports reviewed    RADIOLOGY RESULTS: Volume Study performed   IMPRESSION: Gleason 7(4+3) adenocarcinoma the prostate in 71 year old male forI-125 interstitial implant used as a boost after completion of pelvic nodal and prostate external beam radiation therapy  PLAN: this time patientwill undergo volume study We are planning to use I-125 interstitial implant as a boost after completion of IM RT radiation therapy for Gleason 7 adenocarcinoma the prostate.Risks and benefits of procedure were reviewed with the patient consent was signed. Preop orders anddate of surgery were discussed with the patient. He also understands radiation safety precautions.Patient is to call with any concerns.  I would like to take this opportunity to thank you for allowing me to participate in the care of your patient.Armstead Peaks., MD

## 2016-08-16 ENCOUNTER — Ambulatory Visit
Admission: RE | Admit: 2016-08-16 | Discharge: 2016-08-16 | Disposition: A | Discharge status: Home / Self Care | Payer: Medicare Other | Source: Ambulatory Visit | Attending: Radiation Oncology | Admitting: Radiation Oncology

## 2016-08-16 ENCOUNTER — Ambulatory Visit: Admission: RE | Admit: 2016-08-16 | Payer: Medicare Other | Source: Ambulatory Visit | Admitting: Urology

## 2016-08-16 DIAGNOSIS — C61 Malignant neoplasm of prostate: Secondary | ICD-10-CM | POA: Diagnosis not present

## 2016-08-17 ENCOUNTER — Ambulatory Visit
Admission: RE | Admit: 2016-08-17 | Discharge: 2016-08-17 | Disposition: A | Discharge status: Home / Self Care | Payer: Medicare Other | Source: Ambulatory Visit | Attending: Radiation Oncology | Admitting: Radiation Oncology

## 2016-08-17 DIAGNOSIS — C61 Malignant neoplasm of prostate: Secondary | ICD-10-CM | POA: Diagnosis not present

## 2016-08-18 ENCOUNTER — Ambulatory Visit
Admission: RE | Admit: 2016-08-18 | Discharge: 2016-08-18 | Disposition: A | Discharge status: Home / Self Care | Payer: Medicare Other | Source: Ambulatory Visit | Attending: Radiation Oncology | Admitting: Radiation Oncology

## 2016-08-18 DIAGNOSIS — C61 Malignant neoplasm of prostate: Secondary | ICD-10-CM | POA: Diagnosis not present

## 2016-08-21 ENCOUNTER — Ambulatory Visit
Admission: RE | Admit: 2016-08-21 | Discharge: 2016-08-21 | Disposition: A | Discharge status: Home / Self Care | Payer: Medicare Other | Source: Ambulatory Visit | Attending: Radiation Oncology | Admitting: Radiation Oncology

## 2016-08-21 DIAGNOSIS — C61 Malignant neoplasm of prostate: Secondary | ICD-10-CM | POA: Diagnosis not present

## 2016-08-22 ENCOUNTER — Ambulatory Visit
Admission: RE | Admit: 2016-08-22 | Discharge: 2016-08-22 | Disposition: A | Discharge status: Home / Self Care | Payer: Medicare Other | Source: Ambulatory Visit | Attending: Radiation Oncology | Admitting: Radiation Oncology

## 2016-08-22 DIAGNOSIS — C61 Malignant neoplasm of prostate: Secondary | ICD-10-CM | POA: Diagnosis not present

## 2016-08-23 ENCOUNTER — Ambulatory Visit
Admission: RE | Admit: 2016-08-23 | Discharge: 2016-08-23 | Disposition: A | Discharge status: Home / Self Care | Payer: Medicare Other | Source: Ambulatory Visit | Attending: Radiation Oncology | Admitting: Radiation Oncology

## 2016-08-23 DIAGNOSIS — C61 Malignant neoplasm of prostate: Secondary | ICD-10-CM | POA: Diagnosis not present

## 2016-08-24 ENCOUNTER — Ambulatory Visit
Admission: RE | Admit: 2016-08-24 | Discharge: 2016-08-24 | Disposition: A | Discharge status: Home / Self Care | Payer: Medicare Other | Source: Ambulatory Visit | Attending: Radiation Oncology | Admitting: Radiation Oncology

## 2016-08-24 DIAGNOSIS — C61 Malignant neoplasm of prostate: Secondary | ICD-10-CM | POA: Diagnosis not present

## 2016-08-29 ENCOUNTER — Encounter
Admission: RE | Admit: 2016-08-29 | Discharge: 2016-08-29 | Disposition: A | Discharge status: Home / Self Care | Payer: Medicare Other | Source: Ambulatory Visit | Attending: Urology | Admitting: Urology

## 2016-08-29 DIAGNOSIS — Z01812 Encounter for preprocedural laboratory examination: Secondary | ICD-10-CM | POA: Insufficient documentation

## 2016-08-29 LAB — CBC
HCT: 39.2 % — ABNORMAL LOW (ref 40.0–52.0)
HEMOGLOBIN: 13.7 g/dL (ref 13.0–18.0)
MCH: 32.6 pg (ref 26.0–34.0)
MCHC: 35 g/dL (ref 32.0–36.0)
MCV: 93 fL (ref 80.0–100.0)
Platelets: 226 10*3/uL (ref 150–440)
RBC: 4.21 MIL/uL — AB (ref 4.40–5.90)
RDW: 14 % (ref 11.5–14.5)
WBC: 5.9 10*3/uL (ref 3.8–10.6)

## 2016-08-29 LAB — POTASSIUM: POTASSIUM: 4.9 mmol/L (ref 3.5–5.1)

## 2016-08-29 NOTE — Patient Instructions (Signed)
Your procedure is scheduled on: 09/12/16 Report to [facility]. 2ND FLOOR MEDICAL MALL ENTRANCE. To find out your arrival time please call [phone] between 1PM - 3PM on 09/11/16.  Remember: Instructions that are not followed completely may result in serious medical risk, up to and including death, or upon the discretion of your surgeon and anesthesiologist your surgery may need to be rescheduled.    __X__ 1. Do not eat food or drink liquids after midnight. No gum chewing or hard candies.     __X__ 2. No Alcohol for 24 hours before or after surgery.   ____ 3. Bring all medications with you on the day of surgery if instructed.    __X__ 4. Notify your doctor if there is any change in your medical condition     (cold, fever, infections).             ___X__5. No smoking within 24 hours of your surgery.     Do not wear jewelry, make-up, hairpins, clips or nail polish.  Do not wear lotions, powders, or perfumes.   Do not shave 48 hours prior to surgery. Men may shave face and neck.  Do not bring valuables to the hospital.    [facility] is not responsible for any belongings or valuables.               Contacts, dentures or bridgework may not be worn into surgery.  Leave your suitcase in the car. After surgery it may be brought to your room.  For patients admitted to the hospital, discharge time is determined by your                treatment team.   Patients discharged the day of surgery will not be allowed to drive home.   Please read over the following fact sheets that you were given:   MRSA Information   __X__ Take these medicines the morning of surgery with A SIP OF WATER:    1. OMEPRAZOLE  2. BENAZEPRIL  3. RANEXA  4.  5.  6.  ____ Fleet Enema (as directed)   ____ Use CHG Soap as directed  ____ Use inhalers on the day of surgery  ____ Stop metformin 2 days prior to surgery    ____ Take 1/2 of usual insulin dose the night before surgery and none on the morning of  surgery.   __X__ CONTACT AMY AT DR Erlene Quan OFFICE ABOUT WHEN TO STOP XARELTO  __X__ Stop Anti-inflammatories such as Advil, Aleve, Ibuprofen, Motrin, Naproxen, Naprosyn, Goodies,powder, or aspirin products.  OK to take Tylenol.   __X__ Stop supplements until after surgery.  FISH OIL  ____ Bring C-Pap to the hospital.

## 2016-09-01 ENCOUNTER — Other Ambulatory Visit: Payer: Self-pay

## 2016-09-04 ENCOUNTER — Other Ambulatory Visit: Payer: Self-pay | Admitting: Psychiatry

## 2016-09-11 MED ORDER — CEFAZOLIN SODIUM-DEXTROSE 1-4 GM/50ML-% IV SOLN
1.0000 g | INTRAVENOUS | Status: AC
Start: 2016-09-11 — End: 2016-09-12
  Administered 2016-09-12: 1 g via INTRAVENOUS

## 2016-09-12 ENCOUNTER — Ambulatory Visit
Admission: RE | Admit: 2016-09-12 | Discharge: 2016-09-12 | Disposition: A | Discharge status: Home / Self Care | Payer: Medicare HMO | Source: Ambulatory Visit | Attending: Radiation Oncology | Admitting: Radiation Oncology

## 2016-09-12 ENCOUNTER — Ambulatory Visit: Payer: Medicare Other | Admitting: Anesthesiology

## 2016-09-12 ENCOUNTER — Encounter: Payer: Self-pay | Admitting: *Deleted

## 2016-09-12 ENCOUNTER — Ambulatory Visit
Admission: RE | Admit: 2016-09-12 | Discharge: 2016-09-12 | Disposition: A | Discharge status: Home / Self Care | Payer: Medicare Other | Source: Ambulatory Visit | Attending: Urology | Admitting: Urology

## 2016-09-12 ENCOUNTER — Encounter
Admission: RE | Disposition: A | Discharge status: Home / Self Care | Payer: Self-pay | Source: Ambulatory Visit | Attending: Urology

## 2016-09-12 DIAGNOSIS — Z8042 Family history of malignant neoplasm of prostate: Secondary | ICD-10-CM | POA: Insufficient documentation

## 2016-09-12 DIAGNOSIS — C61 Malignant neoplasm of prostate: Secondary | ICD-10-CM | POA: Diagnosis not present

## 2016-09-12 DIAGNOSIS — I2510 Atherosclerotic heart disease of native coronary artery without angina pectoris: Secondary | ICD-10-CM | POA: Insufficient documentation

## 2016-09-12 DIAGNOSIS — F17210 Nicotine dependence, cigarettes, uncomplicated: Secondary | ICD-10-CM | POA: Insufficient documentation

## 2016-09-12 DIAGNOSIS — Z7951 Long term (current) use of inhaled steroids: Secondary | ICD-10-CM | POA: Diagnosis not present

## 2016-09-12 DIAGNOSIS — F419 Anxiety disorder, unspecified: Secondary | ICD-10-CM | POA: Diagnosis not present

## 2016-09-12 DIAGNOSIS — Z7901 Long term (current) use of anticoagulants: Secondary | ICD-10-CM | POA: Insufficient documentation

## 2016-09-12 DIAGNOSIS — I10 Essential (primary) hypertension: Secondary | ICD-10-CM | POA: Insufficient documentation

## 2016-09-12 DIAGNOSIS — Z79899 Other long term (current) drug therapy: Secondary | ICD-10-CM | POA: Diagnosis not present

## 2016-09-12 DIAGNOSIS — I252 Old myocardial infarction: Secondary | ICD-10-CM | POA: Diagnosis not present

## 2016-09-12 DIAGNOSIS — J449 Chronic obstructive pulmonary disease, unspecified: Secondary | ICD-10-CM | POA: Diagnosis not present

## 2016-09-12 DIAGNOSIS — K219 Gastro-esophageal reflux disease without esophagitis: Secondary | ICD-10-CM | POA: Insufficient documentation

## 2016-09-12 DIAGNOSIS — Z95820 Peripheral vascular angioplasty status with implants and grafts: Secondary | ICD-10-CM | POA: Insufficient documentation

## 2016-09-12 DIAGNOSIS — I739 Peripheral vascular disease, unspecified: Secondary | ICD-10-CM | POA: Insufficient documentation

## 2016-09-12 HISTORY — PX: RADIOACTIVE SEED IMPLANT: SHX5150

## 2016-09-12 SURGERY — INSERTION, RADIATION SOURCE, PROSTATE
Anesthesia: General

## 2016-09-12 MED ORDER — BACITRACIN ZINC 500 UNIT/GM EX OINT
TOPICAL_OINTMENT | CUTANEOUS | Status: AC
  Filled 2016-09-12: qty 28.35

## 2016-09-12 MED ORDER — SUCCINYLCHOLINE CHLORIDE 20 MG/ML IJ SOLN
INTRAMUSCULAR | Status: DC | PRN
  Administered 2016-09-12: 80 mg via INTRAVENOUS

## 2016-09-12 MED ORDER — HYDROCODONE-ACETAMINOPHEN 5-325 MG PO TABS
ORAL_TABLET | ORAL | Status: AC
  Filled 2016-09-12: qty 1

## 2016-09-12 MED ORDER — ROCURONIUM BROMIDE 50 MG/5ML IV SOLN
INTRAVENOUS | Status: AC
  Filled 2016-09-12: qty 1

## 2016-09-12 MED ORDER — SUCCINYLCHOLINE CHLORIDE 20 MG/ML IJ SOLN
INTRAMUSCULAR | Status: AC
  Filled 2016-09-12: qty 1

## 2016-09-12 MED ORDER — PROPOFOL 10 MG/ML IV BOLUS
INTRAVENOUS | Status: DC | PRN
  Administered 2016-09-12: 130 mg via INTRAVENOUS
  Administered 2016-09-12: 30 mg via INTRAVENOUS
  Administered 2016-09-12: 20 mg via INTRAVENOUS

## 2016-09-12 MED ORDER — LIDOCAINE HCL (PF) 2 % IJ SOLN
INTRAMUSCULAR | Status: AC
  Filled 2016-09-12: qty 2

## 2016-09-12 MED ORDER — HYDROCODONE-ACETAMINOPHEN 5-325 MG PO TABS: 1.0000 | ORAL_TABLET | Freq: Four times a day (QID) | ORAL | Status: DC | PRN

## 2016-09-12 MED ORDER — CIPROFLOXACIN HCL 500 MG PO TABS: 500.0000 mg | ORAL_TABLET | Freq: Two times a day (BID) | ORAL | 0 refills | Status: DC

## 2016-09-12 MED ORDER — PROPOFOL 10 MG/ML IV BOLUS
INTRAVENOUS | Status: AC
  Filled 2016-09-12: qty 20

## 2016-09-12 MED ORDER — HYDROCODONE-ACETAMINOPHEN 5-325 MG PO TABS: 1.0000 | ORAL_TABLET | Freq: Four times a day (QID) | ORAL | 0 refills | Status: DC | PRN

## 2016-09-12 MED ORDER — ONDANSETRON HCL 4 MG/2ML IJ SOLN
INTRAMUSCULAR | Status: DC | PRN
  Administered 2016-09-12: 4 mg via INTRAVENOUS

## 2016-09-12 MED ORDER — ONDANSETRON HCL 4 MG/2ML IJ SOLN: 4.0000 mg | Freq: Once | INTRAMUSCULAR | Status: DC | PRN

## 2016-09-12 MED ORDER — LACTATED RINGERS IV SOLN
INTRAVENOUS | Status: DC
  Administered 2016-09-12 (×2): via INTRAVENOUS

## 2016-09-12 MED ORDER — SUGAMMADEX SODIUM 200 MG/2ML IV SOLN
INTRAVENOUS | Status: DC | PRN
  Administered 2016-09-12: 183.2 mg via INTRAVENOUS

## 2016-09-12 MED ORDER — FLEET ENEMA 7-19 GM/118ML RE ENEM: 1.0000 | ENEMA | Freq: Once | RECTAL | Status: DC

## 2016-09-12 MED ORDER — FENTANYL CITRATE (PF) 100 MCG/2ML IJ SOLN
INTRAMUSCULAR | Status: AC
  Filled 2016-09-12: qty 2

## 2016-09-12 MED ORDER — FENTANYL CITRATE (PF) 100 MCG/2ML IJ SOLN: 25.0000 ug | INTRAMUSCULAR | Status: DC | PRN

## 2016-09-12 MED ORDER — CEFAZOLIN SODIUM-DEXTROSE 1-4 GM/50ML-% IV SOLN
INTRAVENOUS | Status: AC
  Filled 2016-09-12: qty 50

## 2016-09-12 MED ORDER — ROCURONIUM BROMIDE 100 MG/10ML IV SOLN: INTRAVENOUS | Status: DC | PRN

## 2016-09-12 MED ORDER — DOCUSATE SODIUM 100 MG PO CAPS: 100.0000 mg | ORAL_CAPSULE | Freq: Two times a day (BID) | ORAL | 0 refills | Status: DC

## 2016-09-12 MED ORDER — FENTANYL CITRATE (PF) 100 MCG/2ML IJ SOLN
INTRAMUSCULAR | Status: DC | PRN
  Administered 2016-09-12 (×2): 50 ug via INTRAVENOUS

## 2016-09-12 MED ORDER — MIDAZOLAM HCL 2 MG/2ML IJ SOLN
INTRAMUSCULAR | Status: DC | PRN
  Administered 2016-09-12 (×2): 1 mg via INTRAVENOUS

## 2016-09-12 MED ORDER — ONDANSETRON HCL 4 MG/2ML IJ SOLN
INTRAMUSCULAR | Status: AC
  Filled 2016-09-12: qty 2

## 2016-09-12 MED ORDER — SUGAMMADEX SODIUM 200 MG/2ML IV SOLN
INTRAVENOUS | Status: AC
  Filled 2016-09-12: qty 2

## 2016-09-12 MED ORDER — MIDAZOLAM HCL 2 MG/2ML IJ SOLN
INTRAMUSCULAR | Status: AC
  Filled 2016-09-12: qty 2

## 2016-09-12 MED ORDER — LIDOCAINE HCL (CARDIAC) 20 MG/ML IV SOLN
INTRAVENOUS | Status: DC | PRN
  Administered 2016-09-12: 40 mg via INTRAVENOUS

## 2016-09-12 MED ORDER — ROCURONIUM BROMIDE 100 MG/10ML IV SOLN
INTRAVENOUS | Status: DC | PRN
  Administered 2016-09-12: 10 mg via INTRAVENOUS
  Administered 2016-09-12: 5 mg via INTRAVENOUS
  Administered 2016-09-12: 10 mg via INTRAVENOUS
  Administered 2016-09-12: 15 mg via INTRAVENOUS

## 2016-09-12 SURGICAL SUPPLY — 27 items
BAG URO DRAIN 2000ML W/SPOUT (MISCELLANEOUS) ×3 IMPLANT
BLADE CLIPPER SURG (BLADE) ×3 IMPLANT
CATH FOL 2WAY LX 16X5 (CATHETERS) ×3 IMPLANT
DRAPE INCISE 23X17 IOBAN STRL (DRAPES) ×2
DRAPE INCISE 23X17 STRL (DRAPES) ×1 IMPLANT
DRAPE INCISE IOBAN 23X17 STRL (DRAPES) ×1 IMPLANT
DRAPE SHEET LG 3/4 BI-LAMINATE (DRAPES) ×3 IMPLANT
DRAPE TABLE BACK 80X90 (DRAPES) ×3 IMPLANT
DRAPE UNDER BUTTOCK W/FLU (DRAPES) ×3 IMPLANT
DRSG TELFA 3X8 NADH (GAUZE/BANDAGES/DRESSINGS) ×3 IMPLANT
GLOVE BIO SURGEON STRL SZ 6.5 (GLOVE) ×4 IMPLANT
GLOVE BIO SURGEON STRL SZ7.5 (GLOVE) ×6 IMPLANT
GLOVE BIO SURGEONS STRL SZ 6.5 (GLOVE) ×2
GOWN STRL REUS W/ TWL LRG LVL3 (GOWN DISPOSABLE) ×2 IMPLANT
GOWN STRL REUS W/ TWL XL LVL3 (GOWN DISPOSABLE) ×1 IMPLANT
GOWN STRL REUS W/TWL LRG LVL3 (GOWN DISPOSABLE) ×6
GOWN STRL REUS W/TWL XL LVL3 (GOWN DISPOSABLE) ×3
IV NS 1000ML (IV SOLUTION) ×3
IV NS 1000ML BAXH (IV SOLUTION) ×1 IMPLANT
KIT RM TURNOVER CYSTO AR (KITS) ×3 IMPLANT
PACK CYSTO AR (MISCELLANEOUS) ×3 IMPLANT
PAD DRESSING TELFA 3X8 NADH (GAUZE/BANDAGES/DRESSINGS) ×1 IMPLANT
SET CYSTO W/LG BORE CLAMP LF (SET/KITS/TRAYS/PACK) ×3 IMPLANT
SURGILUBE 2OZ TUBE FLIPTOP (MISCELLANEOUS) ×3 IMPLANT
SYRINGE 10CC LL (SYRINGE) ×3 IMPLANT
SYRINGE IRR TOOMEY STRL 70CC (SYRINGE) IMPLANT
WATER STERILE IRR 1000ML POUR (IV SOLUTION) ×3 IMPLANT

## 2016-09-12 NOTE — Transfer of Care (Signed)
Immediate Anesthesia Transfer of Care Note  Patient: Bruce [name]  Procedure(s) Performed: Procedure(s): RADIOACTIVE SEED IMPLANT/BRACHYTHERAPY IMPLANT (N/A)  Patient Location: PACU  Anesthesia Type:General  Level of Consciousness: awake  Airway & Oxygen Therapy: Patient Spontanous Breathing and Patient connected to face mask oxygen  Post-op Assessment: Report given to RN and Post -op Vital signs reviewed and stable  Post vital signs: Reviewed and stable  Last Vitals:  Vitals:   09/12/16 0627 09/12/16 0844  BP: (!) 143/88   Pulse: 78   Resp: 18   Temp: (!) 36 C (!) 36.3 C    Last Pain:  Vitals:   09/12/16 0844  TempSrc:   PainSc: 0-No pain         Complications: No apparent anesthesia complications

## 2016-09-12 NOTE — Progress Notes (Signed)
Radiation Oncology Operative Note  Name: Bruce [name]   Date:   07/06/2016 MRN:  [id number] DOB: Sep 03, 1945    This 71 y.o. male presents to the clinic today for I-125 interstitial implant as a boost for adenocarcinoma the prostate treatment.  REFERRING PROVIDER: No ref. provider found  HPI: Patient is a 71 year old male initially presented with a PSA of 18.5 has a 56 g prostate biopsy positive for Gleason 7 (4+3) adenocarcinoma. He is undergone I MRT radiation therapy to both his prostate and pelvic nodes. He was seen today for outpatient surgery for I-125 interstitial implant..  COMPLICATIONS OF TREATMENT: none  FOLLOW UP COMPLIANCE: keeps appointments   PHYSICAL EXAM:  BP 125/70   Pulse 66   Temp 98.2 F (36.8 C) (Temporal)   Resp 16   Ht 6\' 1"  (1.854 m)   Wt 202 lb (91.6 kg)   SpO2 100%   BMI 26.65 kg/m  Well-developed well-nourished patient in NAD. HEENT reveals PERLA, EOMI, discs not visualized.  Oral cavity is clear. No oral mucosal lesions are identified. Neck is clear without evidence of cervical or supraclavicular adenopathy. Lungs are clear to A&P. Cardiac examination is essentially unremarkable with regular rate and rhythm without murmur rub or thrill. Abdomen is benign with no organomegaly or masses noted. Motor sensory and DTR levels are equal and symmetric in the upper and lower extremities. Cranial nerves II through XII are grossly intact. Proprioception is intact. No peripheral adenopathy or edema is identified. No motor or sensory levels are noted. Crude visual fields are within normal range.  RADIOLOGY RESULTS: Ultrasound guidance and plain film for QA source count completed  patient was taken to the operating room and general anesthesia was administered. Legs were immobilized in stirrups and patient was positioned in the exact same proportions as original volume study. Patient was prepped and Foley catheter was placed. Ultrasound guidance identified the prostate  and recreated the original set up as per treatment planning volume study. 18 needles were placed under ultrasound guidance with PVCs delivered to the prostate volume. After completion of procedure cystoscopy was performed by urology and no evidence of seeds in the bladder were noted. Patient tolerated the procedure extremely well. Initial plain film as doublecheck identified 63 seeds in the prostate. Patient has followup appointment in one month for CT scan for quality assurance will be performed.     Armstead Peaks., MD

## 2016-09-12 NOTE — Anesthesia Postprocedure Evaluation (Signed)
Anesthesia Post Note  Patient: Bruce [name]  Procedure(s) Performed: Procedure(s) (LRB): RADIOACTIVE SEED IMPLANT/BRACHYTHERAPY IMPLANT (N/A)  Patient location during evaluation: PACU Anesthesia Type: General Level of consciousness: awake and alert Pain management: pain level controlled Vital Signs Assessment: post-procedure vital signs reviewed and stable Respiratory status: spontaneous breathing and respiratory function stable Cardiovascular status: stable Anesthetic complications: no     Last Vitals:  Vitals:   09/12/16 0914 09/12/16 0920  BP: 122/82 126/86  Pulse:  80  Resp:  16  Temp: 36.4 C 36.8 C    Last Pain:  Vitals:   09/12/16 0920  TempSrc: Temporal  PainSc: 7                  Bruce [name]

## 2016-09-12 NOTE — Anesthesia Procedure Notes (Signed)
Procedure Name: Intubation Date/Time: 09/12/2016 7:45 AM Performed by: Allean Found Pre-anesthesia Checklist: Patient identified, Emergency Drugs available, Suction available, Patient being monitored and Timeout performed Patient Re-evaluated:Patient Re-evaluated prior to inductionOxygen Delivery Method: Circle system utilized Preoxygenation: Pre-oxygenation with 100% oxygen Intubation Type: IV induction Ventilation: Mask ventilation without difficulty Laryngoscope Size: Mac and 4 Grade View: Grade II Tube type: Oral Tube size: 7.5 mm Number of attempts: 1 Airway Equipment and Method: Stylet and Oral airway Placement Confirmation: ETT inserted through vocal cords under direct vision,  positive ETCO2 and breath sounds checked- equal and bilateral Secured at: 23 cm Tube secured with: Tape Dental Injury: Teeth and Oropharynx as per pre-operative assessment  Comments: Edentulous, cheeks floppy, short beard made mask fit a challenge

## 2016-09-12 NOTE — OR Nursing (Signed)
1030 Voided 250cc residual 196cc.  Dr. Erlene Quan notified.  Wants patient to void once more before leaving.

## 2016-09-12 NOTE — Interval H&P Note (Signed)
History and Physical Interval Note:  09/12/2016 7:15 AM  Bruce [name]  has presented today for surgery, with the diagnosis of prostate cancer  The various methods of treatment have been discussed with the patient and family. After consideration of risks, benefits and other options for treatment, the patient has consented to  Procedure(s): RADIOACTIVE SEED IMPLANT/BRACHYTHERAPY IMPLANT (N/A) as a surgical intervention .  The patient's history has been reviewed, patient examined, no change in status, stable for surgery.  I have reviewed the patient's chart and labs.  Questions were answered to the patient's satisfaction.    RRR cTAB  Hollice Espy

## 2016-09-12 NOTE — OR Nursing (Signed)
Voided 225 cc.  63ml residual.  Instructions complete.  DC home

## 2016-09-12 NOTE — Discharge Instructions (Signed)
Brachytherapy for Prostate Cancer, Care After °Refer to this sheet in the next few weeks. These instructions provide you with information on caring for yourself after your procedure. Your health care provider may also give you more specific instructions. Your treatment has been planned according to current medical practices, but problems sometimes occur. Call your health care provider if you have any problems or questions after your procedure. °What can I expect after the procedure? °The area behind the scrotum will probably be tender and bruised. For a short period of time you may have: °· Difficulty passing urine. You may need a catheter for a few days to a month. °· Blood in the urine or semen. °· A feeling of constipation because of prostate swelling. °· Frequent feeling of an urgent need to urinate. ° °For a long period of time you may have: °· Inflammation of the rectum. This happens in about 2% of people who have the procedure. °· Erection problems. These vary with age and occur in about 15-40% of men. °· Difficulty urinating. This is caused by scarring in the urethra. °· Diarrhea. ° °Follow these instructions at home: °· Take medicines only as directed by your health care provider. °· You will probably have a catheter in your bladder for several days. You will have blood in the urine bag and should drink a lot of fluids to keep it a light red color. °· Keep all follow-up visits as directed by your health care provider. If you have a catheter, it will be removed during one of these visits. °· Try not to sit directly on the area behind the scrotum. A soft cushion can decrease the discomfort. Ice packs may also be helpful for the discomfort. Do not put ice directly on the skin. °· Shower and wash the area behind the scrotum gently. Do not sit in a tub. °· If you have had the brachytherapy that uses the seeds, limit your close contact with children and pregnant women for 2 months because of the radiation still  in the prostate. After that period of time, the levels drop off quickly. °Get help right away if: °· You have a fever. °· You have chills. °· You have shortness of breath. °· You have chest pain. °· You have thick blood, like tomato juice, in the urine bag. °· Your catheter is blocked so urine cannot get into the bag. Your bladder area or lower abdomen may be swollen. °· There is excessive bleeding from your rectum. It is normal to have a little blood mixed with your stool. °· There is severe discomfort in the treated area that does not go away with pain medicine. °· You have abdominal discomfort. °· You have severe nausea or vomiting. °· You develop any new or unusual symptoms. °This information is not intended to replace advice given to you by your health care provider. Make sure you discuss any questions you have with your health care provider. °Document Released: 03/25/2010 Document Revised: 08/04/2015 Document Reviewed: 08/13/2012 °Elsevier Interactive Patient Education © 2017 [employer] ° ° ° °AMBULATORY SURGERY  °DISCHARGE INSTRUCTIONS ° ° °1) The drugs that you were given will stay in your system until tomorrow so for the next 24 hours you should not: ° °A) Drive an automobile °B) Make any legal decisions °C) Drink any alcoholic beverage ° ° °2) You may resume regular meals tomorrow.  Today it is better to start with liquids and gradually work up to solid foods. ° °You may eat anything   you prefer, but it is better to start with liquids, then soup and crackers, and gradually work up to solid foods. ° ° °3) Please notify your doctor immediately if you have any unusual bleeding, trouble breathing, redness and pain at the surgery site, drainage, fever, or pain not relieved by medication. ° ° ° °4) Additional Instructions: ° ° ° ° ° ° ° °Please contact your physician with any problems or Same Day Surgery at [phone], Monday through Friday 6 am to 4 pm, or [facility] [place] [facility] number at  [phone]. °

## 2016-09-12 NOTE — Anesthesia Preprocedure Evaluation (Addendum)
Anesthesia Evaluation  Patient identified by MRN, date of birth, ID band Patient awake    Reviewed: Allergy & Precautions, NPO status , Patient's Chart, lab work & pertinent test results  History of Anesthesia Complications Negative for: history of anesthetic complications  Airway Mallampati: II       Dental  (+) Upper Dentures, Lower Dentures   Pulmonary COPD (pt denies), Current Smoker,           Cardiovascular hypertension, Pt. on medications and Pt. on home beta blockers + angina + CAD, + Past MI and +CHF       Neuro/Psych neg Seizures Anxiety    GI/Hepatic Neg liver ROS, GERD  Medicated and Controlled,  Endo/Other  negative endocrine ROS  Renal/GU Renal disease (Staghorn calculus)     Musculoskeletal   Abdominal   Peds  Hematology   Anesthesia Other Findings   Reproductive/Obstetrics                             Anesthesia Physical Anesthesia Plan  ASA: III  Anesthesia Plan: General   Post-op Pain Management:    Induction: Intravenous  PONV Risk Score and Plan: 1 and Ondansetron and Dexamethasone  Airway Management Planned: Oral ETT  Additional Equipment:   Intra-op Plan:   Post-operative Plan:   Informed Consent: I have reviewed the patients History and Physical, chart, labs and discussed the procedure including the risks, benefits and alternatives for the proposed anesthesia with the patient or authorized representative who has indicated his/her understanding and acceptance.     Plan Discussed with:   Anesthesia Plan Comments:        Anesthesia Quick Evaluation

## 2016-09-12 NOTE — Op Note (Signed)
09/12/16  Preoperative diagnosis: Adenocarcinoma of the prostate   Postoperative diagnosis: Same   Procedure: I-125 prostate seed implantation, cystoscopy  Surgeon: Hollice Espy M.D.  Radiation Oncologist: Lavena Stanford, M.D.   Anesthesia: General  Drains: none  Complications: none  Indications: Prostate cancer, see H&P  Procedure: Patient was brought to operating suite and placement table in the supine position. At this time, a universal timeout protocol was performed, all team members were identified, Venodyne boots are placed, and he was administered IV Ancef in the preoperative period. He was placed in lithotomy position and prepped and draped in usual manner. Radiation oncology department placed a transrectal ultrasound probe anchoring stand/ grid and aligned with previous imaging from the volume study. Foley catheter was inserted without difficulty.  All needle passage was done with real-time transrectal ultrasound guidance in both the transverse and sagittal plains in order to achieve the desired preplanned position. A total of 63 needles were placed.  17 active seeds were implanted. The Foley catheter was removed and a rigid cystoscopy failed to show any seeds outside the prostate without evidence of trauma to the urethral, prostatic fossa, or bladder.  The bladder was drained.  A fluoroscopic image was then obtained showing excellent distrubution of the brachytherapy seeds.  Each seed was counted and counts were correct.    The patient was then repositioned in the supine position, reversed from anesthesia, and taken to the PACU in stable condition.

## 2016-09-12 NOTE — Anesthesia Post-op Follow-up Note (Cosign Needed)
Anesthesia QCDR form completed.        

## 2016-09-12 NOTE — H&P (View-Only) (Signed)
NEW PATIENT EVALUATION  Name: Bruce [name]  MRN: [id number]  Date:   08/15/2016     DOB: 09/02/1945   This 71 y.o. male patient presents to the clinic for ihistory and physical prior to I-125 interstitial implant for adenocarcinoma the prostate  REFERRING PHYSICIAN: Danelle Berry, NP  CHIEF COMPLAINT:  Chief Complaint  Patient presents with  . Prostate Cancer    DIAGNOSIS: The encounter diagnosis was Malignant neoplasm of prostate ([place]).   PREVIOUS INVESTIGATIONS:  Clinical notes reviewed Pathology report reviewed Volume study performed Patient is a 71 year old male who presented to his family doctor with a PSA of 18.5. He has a prostate volume of 56 g was noted to have transrectal ultrasound-guided biopsy a Gleason score of 7 (4+3) on for of 12 cores. CT scan of the abdomen pelvis in March 2018 showed no evidence of lymphadenopathy or metastatic disease. He has significant comorbidities including coronary artery disease with a prior MI COPD and peripheral vascular disease. He does take blood thinner and was cleared to come off of that for his prostate biopsy. He has very little lower urinary tract symptoms nocturia 1 no frequency or urgency.patient is undergone external beam radiation therapy to both his prostate and pelvic nodes. He is now seen for volume study as well as I-125 interstitial implant for boost.  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    PAST SURGICAL HISTORY:  Past Surgical History:  Procedure Laterality Date  . CARDIAC CATHETERIZATION    . CHOLECYSTECTOMY    . EXTERNAL FIXATION LEG Right 10/06/2013   Procedure: CLOSED REDUCTION RIGHT TIBIAL PLATEAU FRACTURE, EXTERNAL FIXATION RIGHT LEG, PLACEMENT OF WOUND VAC;  Surgeon: Rozanna Box, MD;  Location: [place];  Service: Orthopedics;  Laterality: Right;  . FEMORAL-POPLITEAL BYPASS GRAFT Right 10/06/2013   Procedure: RIGHT POPLITEAL-POPLITEAL ARTERY BYPASS GRAFT;  Surgeon: Rosetta Posner, MD;  Location: [place];  Service: Vascular;  Laterality: Right;  . FRACTURE SURGERY Right 2014  . HARDWARE REMOVAL Right 12/02/2013   Procedure: REMOVAL EXTERNAL FIXATION RIGHT LEG ;  Surgeon: Rozanna Box, MD;  Location: [place];  Service: Orthopedics;  Laterality: Right;  . HERNIA REPAIR Right 1990's  . I&D EXTREMITY Right 10/09/2013   Procedure: IRRIGATION AND DEBRIDEMENT RIGHT LEG, CLOSURE  OF WOUNDS, PLACEMENT OF WOUND VAC ON EACH SIDE OF LEG;  Surgeon: Rozanna Box, MD;  Location: [place];  Service: Orthopedics;  Laterality: Right;  . IR NEPHROSTOMY PLACEMENT LEFT  06/19/2016  . IVC filter  2009   placed @ [facility]/ Removed in 2010.  . IVC Filter Removed    . ligament leg Left   . NEPHROLITHOTOMY Left 06/19/2016   Procedure: NEPHROLITHOTOMY PERCUTANEOUS;  Surgeon: Hollice Espy, MD;  Location: [facility];  Service: Urology;  Laterality: Left;  . ORIF TIBIA FRACTURE Right 12/29/2014   Procedure: OPEN REDUCTION INTERNAL FIXATION (ORIF) RIGHT TIBIA FRACTURE, RIA VS ICBG;  Surgeon: [place], MD;  Location: [place];  Service: Orthopedics;  Laterality: Right;    FAMILY HISTORY: family history includes Prostate cancer  in his brother.  SOCIAL HISTORY:  reports that he has been smoking.  He has a 26.00 pack-year smoking history. He has never used smokeless tobacco. He reports that he does not drink alcohol or use drugs.  ALLERGIES: Adhesive [tape]; Imdur  [isosorbide dinitrate]; and Singulair [montelukast sodium]  MEDICATIONS:  Current Outpatient Prescriptions  Medication Sig Dispense Refill  . allopurinol (ZYLOPRIM) 100 MG tablet Take 100 mg by mouth at bedtime.    . ALPRAZolam (XANAX) 0.25 MG tablet Take 1 tablet (0.25 mg total) by mouth daily as needed for anxiety. 30 tablet 2  . atorvastatin (LIPITOR) 80 MG tablet Take 80 mg by mouth at bedtime.     . benazepril (LOTENSIN) 20 MG tablet Take 1 tablet by mouth daily.    Marland Kitchen CALCIUM CITRATE PO Take 250 mg by mouth at bedtime.     . cholecalciferol (VITAMIN D) 1000 units tablet Take 1,000 tablets by mouth daily.     . DOCOSAHEXAENOIC ACID PO Take 1 capsule by mouth daily.     . fluticasone (FLONASE) 50 MCG/ACT nasal spray Place 1 spray into both nostrils daily.    . furosemide (LASIX) 20 MG tablet Take 20 mg by mouth daily.    . hydrOXYzine (VISTARIL) 25 MG capsule Take 1 capsule by mouth at bedtime as needed for sleep.    Marland Kitchen loratadine (CLARITIN) 10 MG tablet Take 10 mg by mouth at bedtime.     . metoprolol succinate (TOPROL-XL) 50 MG 24 hr tablet Take 1 tablet (50 mg total) by mouth daily. (Patient taking differently: Take 50 mg by mouth at bedtime. ) 90 tablet 0  . montelukast (SINGULAIR) 10 MG tablet Take 1 tablet by mouth every evening.    . Multiple Vitamin (MULTIVITAMIN) tablet Take 1 tablet by mouth daily. For Men    . nitroGLYCERIN (NITROSTAT) 0.4 MG SL tablet Place 0.4 mg under the tongue every 5 (five) minutes as needed for chest pain.     . Omega-3 Fatty Acids (FISH OIL PO) Take 1 capsule by mouth daily.    Marland Kitchen omeprazole (PRILOSEC) 40 MG capsule Take 40 mg by mouth daily.    . potassium chloride (K-DUR) 10 MEQ tablet Take 1 tablet by mouth daily.    . Probiotic Product (PROBIOTIC FORMULA PO) Take 1 capsule by mouth at bedtime.     . ranolazine (RANEXA) 1000 MG SR tablet Take 1,000 mg by mouth every morning.     . rivaroxaban (XARELTO) 20 MG TABS tablet Take 20 mg by mouth daily with  supper.    . sertraline (ZOLOFT) 50 MG tablet Take 1 tablet (50 mg total) by mouth daily. 30 tablet 1  . tamsulosin (FLOMAX) 0.4 MG CAPS capsule Take 1 capsule (0.4 mg total) by mouth daily after supper. 30 capsule 6  . traZODone (DESYREL) 100 MG tablet Take 2 tablets (200 mg total) by mouth at bedtime. 60 tablet 2   No current facility-administered medications for this encounter.     ECOG PERFORMANCE STATUS:  0 - Asymptomatic  REVIEW OF SYSTEMS:  Patient denies any weight loss, fatigue, weakness, fever, chills or night sweats. Patient denies any loss of vision, blurred vision. Patient denies any ringing  of the ears or hearing loss. No irregular heartbeat. Patient denies heart murmur or history of fainting. Patient denies any chest pain or pain radiating to her upper extremities. Patient denies any shortness of breath, difficulty breathing at night, cough or hemoptysis. Patient denies any swelling in the lower legs.  Patient denies any nausea vomiting, vomiting of blood, or coffee ground material in the vomitus. Patient denies any stomach pain. Patient states has had normal bowel movements no significant constipation or diarrhea. Patient denies any dysuria, hematuria or significant nocturia. Patient denies any problems walking, swelling in the joints or loss of balance. Patient denies any skin changes, loss of hair or loss of weight. Patient denies any excessive worrying or anxiety or significant depression. Patient denies any problems with insomnia. Patient denies excessive thirst, polyuria, polydipsia. Patient denies any swollen glands, patient denies easy bruising or easy bleeding. Patient denies any recent infections, allergies or URI. Patient "s visual fields have not changed significantly in recent time.    PHYSICAL EXAM: BP 131/78   Pulse (!) 59   Temp 97 F (36.1 C)   Wt 202 lb 9.6 oz (91.9 kg)   BMI 26.73 kg/m  Well-developed well-nourished patient in NAD. HEENT reveals PERLA, EOMI,  discs not visualized.  Oral cavity is clear. No oral mucosal lesions are identified. Neck is clear without evidence of cervical or supraclavicular adenopathy. Lungs are clear to A&P. Cardiac examination is essentially unremarkable with regular rate and rhythm without murmur rub or thrill. Abdomen is benign with no organomegaly or masses noted. Motor sensory and DTR levels are equal and symmetric in the upper and lower extremities. Cranial nerves II through XII are grossly intact. Proprioception is intact. No peripheral adenopathy or edema is identified. No motor or sensory levels are noted. Crude visual fields are within normal range.  LABORATORY DATA: pathology reports reviewed    RADIOLOGY RESULTS: Volume Study performed   IMPRESSION: Gleason 7(4+3) adenocarcinoma the prostate in 71 year old male forI-125 interstitial implant used as a boost after completion of pelvic nodal and prostate external beam radiation therapy  PLAN: this time patientwill undergo volume study We are planning to use I-125 interstitial implant as a boost after completion of IM RT radiation therapy for Gleason 7 adenocarcinoma the prostate.Risks and benefits of procedure were reviewed with the patient consent was signed. Preop orders anddate of surgery were discussed with the patient. He also understands radiation safety precautions.Patient is to call with any concerns.  I would like to take this opportunity to thank you for allowing me to participate in the care of your patient.Armstead Peaks., MD

## 2016-09-14 ENCOUNTER — Telehealth: Payer: Self-pay | Admitting: Urology

## 2016-09-14 MED ORDER — HYDROCODONE-ACETAMINOPHEN 5-325 MG PO TABS: 1.0000 | ORAL_TABLET | Freq: Four times a day (QID) | ORAL | 0 refills | Status: DC | PRN

## 2016-09-14 NOTE — Telephone Encounter (Signed)
Spoke with patient's son , patient still having pain and discomfort in groin area and would like a refill for pain medication. Per Dr. Pilar Jarvis ok to give 5 more tablets, script printed and placed up front for pick up.

## 2016-09-14 NOTE — Telephone Encounter (Signed)
Pt son called office stating his father has only 1 pain pill left and is in a lot of pain, states pt is asking for a refill. Please advise. Thanks.

## 2016-10-05 ENCOUNTER — Other Ambulatory Visit: Payer: Self-pay | Admitting: Psychiatry

## 2016-10-05 DIAGNOSIS — F411 Generalized anxiety disorder: Secondary | ICD-10-CM

## 2016-10-10 ENCOUNTER — Encounter: Payer: Self-pay | Admitting: Radiation Oncology

## 2016-10-10 ENCOUNTER — Ambulatory Visit
Admission: RE | Admit: 2016-10-10 | Discharge: 2016-10-10 | Disposition: A | Discharge status: Home / Self Care | Payer: Medicare Other | Source: Ambulatory Visit | Attending: Radiation Oncology | Admitting: Radiation Oncology

## 2016-10-10 ENCOUNTER — Other Ambulatory Visit: Payer: Self-pay | Admitting: *Deleted

## 2016-10-10 VITALS — BP 131/86 | HR 69 | Temp 95.7°F | Resp 18 | Wt 200.7 lb

## 2016-10-10 DIAGNOSIS — C61 Malignant neoplasm of prostate: Secondary | ICD-10-CM | POA: Insufficient documentation

## 2016-10-10 DIAGNOSIS — R300 Dysuria: Secondary | ICD-10-CM | POA: Insufficient documentation

## 2016-10-10 DIAGNOSIS — Z510 Encounter for antineoplastic radiation therapy: Secondary | ICD-10-CM | POA: Insufficient documentation

## 2016-10-10 NOTE — Progress Notes (Signed)
Radiation Oncology Follow up Note  Name: Bruce [name]   Date:   10/10/2016 MRN:  [id number] DOB: April 04, 1945    This 71 y.o. male presents to the clinic today for one-month follow-up status post I-125 interstitial implant as well as external beam radiation therapy for Gleason 7 adenocarcinoma the prostate (4+3)..  REFERRING PROVIDER: Danelle Berry, NP  HPI: patient is a 71 year old male now seen out 1 month since completing both external beam radiation therapy to his prostate and pelvic nodes as well as I-125 interstitial implant for boost for Gleason 7 (4+3) adenocarcinoma. He presented with a PSA of 18.5. He is seen today in routine follow-up continues to have some slight burning on urination frequency and urgency. He was on Flomax a was discontinued I've asked him to restart that medication. He is also drinking cranberry juice. He is having no diarrhea..  COMPLICATIONS OF TREATMENT: none  FOLLOW UP COMPLIANCE: keeps appointments   PHYSICAL EXAM:  BP 131/86   Pulse 69   Temp (!) 95.7 F (35.4 C)   Resp 18   Wt 200 lb 11.7 oz (91 kg)   BMI 26.48 kg/m  On rectal exam rectal sphincter tone is good. Prostate is smooth contracted without evidence of nodularity or mass. Sulcus is preserved bilaterally. No discrete nodularity is identified. No other rectal abnormalities are noted. Well-developed well-nourished patient in NAD. HEENT reveals PERLA, EOMI, discs not visualized.  Oral cavity is clear. No oral mucosal lesions are identified. Neck is clear without evidence of cervical or supraclavicular adenopathy. Lungs are clear to A&P. Cardiac examination is essentially unremarkable with regular rate and rhythm without murmur rub or thrill. Abdomen is benign with no organomegaly or masses noted. Motor sensory and DTR levels are equal and symmetric in the upper and lower extremities. Cranial nerves II through XII are grossly intact. Proprioception is intact. No peripheral adenopathy or edema is  identified. No motor or sensory levels are noted. Crude visual fields are within normal range.  RADIOLOGY RESULTS: CT scan for quality assurance of seed implant shows excellent placement of sources  PLAN: at this time source placement looks excellent we will do formal evaluation of source placement. I've asked him to restart his Flomax. Otherwise he is doing well I've instructed him he is still radioactive for another month and the symptom should diminish at that time. Otherwise I'm please was overall progress. I've asked to see him back in 3-4 months and will obtain a PSA prior to that visit.  I would like to take this opportunity to thank you for allowing me to participate in the care of your patient.Armstead Peaks., MD

## 2016-10-16 DIAGNOSIS — Z510 Encounter for antineoplastic radiation therapy: Secondary | ICD-10-CM | POA: Diagnosis not present

## 2016-10-18 ENCOUNTER — Ambulatory Visit (INDEPENDENT_AMBULATORY_CARE_PROVIDER_SITE_OTHER): Payer: Medicare Other | Admitting: Urology

## 2016-10-18 ENCOUNTER — Encounter: Payer: Self-pay | Admitting: Urology

## 2016-10-18 VITALS — BP 112/68 | HR 80 | Ht 73.0 in | Wt 200.0 lb

## 2016-10-18 DIAGNOSIS — C61 Malignant neoplasm of prostate: Secondary | ICD-10-CM

## 2016-10-18 DIAGNOSIS — R3915 Urgency of urination: Secondary | ICD-10-CM

## 2016-10-18 DIAGNOSIS — Z87442 Personal history of urinary calculi: Secondary | ICD-10-CM

## 2016-10-18 LAB — BLADDER SCAN AMB NON-IMAGING: Scan Result: 14

## 2016-10-18 NOTE — Progress Notes (Signed)
10/18/2016 5:32 PM   Bruce [name] 1945-03-16 [id number]  Referring provider: Danelle Berry, NP [address] [place], [place] [address]  Chief Complaint  Patient presents with  . 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  Prostate cancer He initially underwent prostate biopsy secondary to a PSA of 13.5 on 02/20/2016.   Repeat PSA was 18.5 on 04/06/2016.  Rectal exam was enlarged without nodules.  TRUS volume 56 g with intravesical protrusion of the median lobe (on cysto/ CT).  Prostate biopsy pathology c/w Gleason 4+3 and 3+3 involving 4 of 12 cores, all on the right side.  Specifically, the right lateral base, the core comprises of 70% of Gleason 4 tumor involving up to 94% of the tissue.  CT abdomen pelvis with and without contrast on 05/09/2016 shows no evidence of lymphadenopathy or metastatic disease, although small mesenteric and retroperitoneal lymph nodes were noted but not reaching pathologic size.    Bone scan showed no evidence of metastatic disease.  At baseline, he has minimal urinary complaints. He likely has underlying erectile dysfunction.  He is elected to proceed with external beam radiation with brachytherapy boost by Dr. Baruch Gouty.  He underwent brachytherapy placement on 09/12/2016 and returns today for follow-up of this.  He is also on 6 months Lupron.  Since the procedure, he has had increased urinary urgency and frequency. This is somewhat bothersome to him. He gets up 3-4 times a night as well. He denies any dysuria or gross hematuria.  He did recently see Dr. Baruch Gouty and was restarted on Flomax. Since starting this medication, his urinary symptoms appear to be improving.      IPSS    Row Name 10/18/16 0900         International Prostate Symptom Score   How often have you had the sensation of not emptying your bladder? About  half the time     How often have you had to urinate less than every two hours? More than half the time     How often have you found you stopped and started again several times when you urinated? Almost always     How often have you found it difficult to postpone urination? More than half the time     How often have you had a weak urinary stream? Almost always     How often have you had to strain to start urination? Almost always     How many times did you typically get up at night to urinate? 4 Times     Total IPSS Score 30       Quality of Life due to urinary symptoms   If you were to spend the rest of your life with your urinary condition just the way it is now how would you feel about that? 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  He denies knowing about having a kidney stone prior to this. Review of previous older images including a renal ultrasound dating back to 2011 do show calcification within the left kidney, measuring 1.6 cm at that time.  Postoperatively, he has had no issues. He had drainage from his left flank incision for a few days afterwards but this is since  result. No significant flank pain, dysuria, or hematuria. He feels overall well.  Stone analysis shows 90% calcium oxalate monohydrate, 10% calcium phosphate.  Follow-up KUB and renal ultrasound unremarkable, no evidence of residual stones or obstruction.   Past medical history significant for CAD with MI, COPD, and peripheral vascular disease.    PMH: Past Medical History:  Diagnosis Date  . Anginal pain ([place])    Left side if chest ,NTG  relieves chaes apin 12/01/13  . Anxiety   . Arthritis   . Cancer [facility])    colon cancer  . CHF (congestive heart failure) ([place])   . 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 Complication  of anesthesia    wake up with a head ache  . COPD (chronic obstructive pulmonary disease) ([place])   . Coronary artery disease   . GERD (gastroesophageal reflux disease)   . Headache    related to sinus congestion  . History of blood transfusion   . History of kidney stones   . Hypertension   . Myocardial infarction [facility])    '09 AND '12  . Peripheral vascular disease ([place])   . Shortness of breath    With exertion .  Marland Kitchen UTI (urinary tract infection)    frequent UTI    Surgical History: Past Surgical History:  Procedure Laterality Date  . CARDIAC CATHETERIZATION    . CHOLECYSTECTOMY    . EXTERNAL FIXATION LEG Right 10/06/2013   Procedure: CLOSED REDUCTION RIGHT TIBIAL PLATEAU FRACTURE, EXTERNAL FIXATION RIGHT LEG, PLACEMENT OF WOUND VAC;  Surgeon: Rozanna Box, MD;  Location: [facility];  Service: Orthopedics;  Laterality: Right;  . FEMORAL-POPLITEAL BYPASS GRAFT Right 10/06/2013   Procedure: RIGHT POPLITEAL-POPLITEAL ARTERY BYPASS GRAFT;  Surgeon: Rosetta Posner, MD;  Location: [facility];  Service: Vascular;  Laterality: Right;  . FRACTURE SURGERY Right 2014  . HARDWARE REMOVAL Right 12/02/2013   Procedure: REMOVAL EXTERNAL FIXATION RIGHT LEG ;  Surgeon: Rozanna Box, MD;  Location: [facility];  Service: Orthopedics;  Laterality: Right;  . HERNIA REPAIR Right 1990's  . I&D EXTREMITY Right 10/09/2013   Procedure: IRRIGATION AND DEBRIDEMENT RIGHT LEG, CLOSURE  OF WOUNDS, PLACEMENT OF WOUND VAC ON EACH SIDE OF LEG;  Surgeon: Rozanna Box, MD;  Location: [facility];  Service: Orthopedics;  Laterality: Right;  . IR NEPHROSTOMY PLACEMENT LEFT  06/19/2016  . IVC filter  2009   placed @ [facility]/ Removed in 2010.  . IVC Filter Removed    . ligament leg Left   . NEPHROLITHOTOMY Left 06/19/2016   Procedure: NEPHROLITHOTOMY PERCUTANEOUS;  Surgeon: Hollice Espy, MD;  Location: [facility];  Service: Urology;  Laterality: Left;  . ORIF TIBIA FRACTURE Right 12/29/2014   Procedure: OPEN REDUCTION INTERNAL FIXATION (ORIF)  RIGHT TIBIA FRACTURE, RIA VS ICBG;  Surgeon: Altamese Heber-Overgaard, MD;  Location: [facility];  Service: Orthopedics;  Laterality: Right;  . RADIOACTIVE SEED IMPLANT N/A 09/12/2016   Procedure: RADIOACTIVE SEED IMPLANT/BRACHYTHERAPY IMPLANT;  Surgeon: Hollice Espy, MD;  Location: [facility];  Service: Urology;  Laterality: N/A;    Home Medications:  Allergies as of 10/18/2016      Reactions   Adhesive [tape] Other (See Comments)   After right leg fracture surgery, pt developed a large blister where tape was applied to his right leg. OK to use paper tape.   Imdur [isosorbide Dinitrate] Other (See Comments)   hallucinations   Singulair [montelukast Sodium] Other (See Comments)   Hallucinations  Medication List       Accurate as of 10/18/16  5:32 PM. Always use your most recent med list.          ALL DAY ALLERGY PO Take 1 tablet by mouth daily.   allopurinol 100 MG tablet Commonly known as:  ZYLOPRIM Take 100 mg by mouth at bedtime.   ALPRAZolam 0.25 MG tablet Commonly known as:  XANAX Take 1 tablet (0.25 mg total) by mouth daily as needed for anxiety.   atorvastatin 80 MG tablet Commonly known as:  LIPITOR Take 80 mg by mouth at bedtime.   benazepril 20 MG tablet Commonly known as:  LOTENSIN Take 20 mg by mouth daily.   CALCIUM CITRATE PO Take 250 mg by mouth at bedtime.   cholecalciferol 1000 units tablet Commonly known as:  VITAMIN D Take 1,000 Units by mouth daily.   DOCOSAHEXAENOIC ACID PO Take 1 capsule by mouth daily.   docusate sodium 100 MG capsule Commonly known as:  COLACE Take 100 mg by mouth at bedtime.   docusate sodium 100 MG capsule Commonly known as:  COLACE Take 1 capsule (100 mg total) by mouth 2 (two) times daily.   FISH OIL PO Take 1,000 mg by mouth daily.   fluticasone 50 MCG/ACT nasal spray Commonly known as:  FLONASE Place 1 spray into both nostrils daily.   furosemide 20 MG tablet Commonly known as:  LASIX Take 20 mg by mouth daily.     hydrOXYzine 25 MG capsule Commonly known as:  VISTARIL Take 25 mg by mouth at bedtime as needed (for sleep.).   loperamide 2 MG capsule Commonly known as:  IMODIUM Take by mouth as needed for diarrhea or loose stools.   loratadine 10 MG tablet Commonly known as:  CLARITIN Take 10 mg by mouth at bedtime.   metoprolol succinate 50 MG 24 hr tablet Commonly known as:  TOPROL-XL Take 1 tablet (50 mg total) by mouth daily.   montelukast 10 MG tablet Commonly known as:  SINGULAIR   multivitamin tablet Take 1 tablet by mouth daily. For Men   nitroGLYCERIN 0.4 MG SL tablet Commonly known as:  NITROSTAT Place 0.4 mg under the tongue every 5 (five) minutes as needed for chest pain.   omeprazole 40 MG capsule Commonly known as:  PRILOSEC Take 40 mg by mouth daily.   potassium chloride 10 MEQ tablet Commonly known as:  K-DUR Take 10 mEq by mouth daily.   PROBIOTIC FORMULA PO Take 1 capsule by mouth at bedtime.   RANEXA 1000 MG SR tablet Generic drug:  ranolazine Take 1,000 mg by mouth every morning.   rivaroxaban 20 MG Tabs tablet Commonly known as:  XARELTO Take 20 mg by mouth daily with supper.   sertraline 50 MG tablet Commonly known as:  ZOLOFT Take 1 tablet (50 mg total) by mouth daily.   tamsulosin 0.4 MG Caps capsule Commonly known as:  FLOMAX Take 1 capsule (0.4 mg total) by mouth daily after supper.   traZODone 100 MG tablet Commonly known as:  DESYREL Take 2 tablets (200 mg total) by mouth at bedtime.       Allergies:  Allergies  Allergen Reactions  . Adhesive [Tape] Other (See Comments)    After right leg fracture surgery, pt developed a large blister where tape was applied to his right leg. OK to use paper tape.  . Imdur [Isosorbide Dinitrate] Other (See Comments)    hallucinations  . Singulair [Montelukast Sodium] Other (See Comments)  Hallucinations     Family History: Family History  Problem Relation Age of Onset  . Prostate cancer  Brother     Social History:  reports that he has been smoking.  He has a 26.00 pack-year smoking history. He has never used smokeless tobacco. He reports that he does not drink alcohol or use drugs.  ROS: UROLOGY Frequent Urination?: Yes Hard to postpone urination?: Yes Burning/pain with urination?: Yes Get up at night to urinate?: Yes Leakage of urine?: No Urine stream starts and stops?: No Trouble starting stream?: Yes Do you have to strain to urinate?: No Blood in urine?: No Urinary tract infection?: No Sexually transmitted disease?: No Injury to kidneys or bladder?: No Painful intercourse?: No Weak stream?: Yes Erection problems?: No Penile pain?: No  Gastrointestinal Nausea?: No Vomiting?: No Indigestion/heartburn?: No Diarrhea?: No Constipation?: No  Constitutional Fever: No Night sweats?: No Weight loss?: Yes Fatigue?: No  Skin Skin rash/lesions?: No Itching?: No  Eyes Blurred vision?: Yes Double vision?: No  Ears/Nose/Throat Sore throat?: No Sinus problems?: Yes  Hematologic/Lymphatic Swollen glands?: No Easy bruising?: No  Cardiovascular Leg swelling?: No Chest pain?: No  Respiratory Cough?: Yes Shortness of breath?: Yes  Endocrine Excessive thirst?: No  Musculoskeletal Back pain?: Yes Joint pain?: Yes  Neurological Headaches?: Yes Dizziness?: Yes  Psychologic Depression?: Yes Anxiety?: Yes  Physical Exam: BP 112/68 (BP Location: Right Arm, Patient Position: Sitting, Cuff Size: Normal)   Pulse 80   Ht 6\' 1"  (1.854 m)   Wt 200 lb (90.7 kg)   BMI 26.39 kg/m   Constitutional:  Alert and oriented, No acute distress. HEENT: [place] AT, moist mucus membranes.  Trachea midline, no masses. Cardiovascular: No clubbing, cyanosis, or edema. Respiratory: Normal respiratory effort, no increased work of breathing. GI: Abdomen is soft, nontender, nondistended, no abdominal masses Neurologic: Grossly intact, no focal deficits, moving all 4  extremities. Psychiatric: Normal mood and affect.  Laboratory Data: Lab Results  Component Value Date   WBC 5.9 08/29/2016   HGB 13.7 08/29/2016   HCT 39.2 (L) 08/29/2016   MCV 93.0 08/29/2016   PLT 226 08/29/2016    Lab Results  Component Value Date   CREATININE 1.44 (H) 06/20/2016   Component     Latest Ref Rng & Units 04/06/2016  PSA     0.0 - 4.0 ng/mL 18.5 (H)    Urinalysis/ Imaging: Results for orders placed or performed in visit on 10/18/16  Bladder Scan (Post Void Residual) in office  Result Value Ref Range   Scan Result 14      Pertinent Imaging:   KUB and renal ultrasound imaging were personally reviewed today and with the patient.   Assessment & Plan:    1. Prostate cancer ([place]) Intermediate (borderline high) risk Gleason 4+3 prostate cancer on the right, PSA 18 without evidence of metastatic disease on CT abdomen pelvis or bone scan  S/p IM RT with brachytherapy boost,  6 Months PSA in 6 months  2. Kidney stone on left side S/p successful left ESWL- no post op issues  3. Urinary urgency/frequency Improving with Flomax Bladder emptying adequately today Patient reassured the symptoms will continue to resolve, advised to follow up sooner if they fail to improve dramatically for the next few weeks or as interested in additional medication for overactivity   Return in about 6 months (around 04/20/2017) for PSA, PVR, IPSS (or sooner if urinary symptoms fail to  improve).   Hollice Espy, MD  [facility] [address]  [address], [place], [place] [address] [phone]

## 2017-01-02 ENCOUNTER — Ambulatory Visit (INDEPENDENT_AMBULATORY_CARE_PROVIDER_SITE_OTHER): Payer: Medicare Other | Admitting: Psychiatry

## 2017-01-02 ENCOUNTER — Encounter: Payer: Self-pay | Admitting: Psychiatry

## 2017-01-02 VITALS — BP 132/82 | HR 62 | Temp 97.7°F | Wt 207.2 lb

## 2017-01-02 DIAGNOSIS — F330 Major depressive disorder, recurrent, mild: Secondary | ICD-10-CM | POA: Diagnosis not present

## 2017-01-02 DIAGNOSIS — F411 Generalized anxiety disorder: Secondary | ICD-10-CM | POA: Diagnosis not present

## 2017-01-02 DIAGNOSIS — F5105 Insomnia due to other mental disorder: Secondary | ICD-10-CM

## 2017-01-02 MED ORDER — MIRTAZAPINE 15 MG PO TABS: 7.5000 mg | ORAL_TABLET | Freq: Every day | ORAL | 1 refills | Status: DC

## 2017-01-02 MED ORDER — TRAZODONE HCL 50 MG PO TABS: 50.0000 mg | ORAL_TABLET | Freq: Every evening | ORAL | 1 refills | Status: DC | PRN

## 2017-01-02 MED ORDER — ALPRAZOLAM 0.25 MG PO TABS: 0.2500 mg | ORAL_TABLET | Freq: Every day | ORAL | 1 refills | Status: DC | PRN

## 2017-01-02 NOTE — Patient Instructions (Addendum)
Mirtazapine tablets What is this medicine? MIRTAZAPINE (mir TAZ a peen) is used to treat depression. This medicine may be used for other purposes; ask your health care provider or pharmacist if you have questions. COMMON BRAND NAME(S): Remeron What should I tell my health care provider before I take this medicine? They need to know if you have any of these conditions: -bipolar disorder -glaucoma -kidney disease -liver disease -suicidal thoughts -an unusual or allergic reaction to mirtazapine, other medicines, foods, dyes, or preservatives -pregnant or trying to get pregnant -breast-feeding How should I use this medicine? Take this medicine by mouth with a glass of water. Follow the directions on the prescription label. Take your medicine at regular intervals. Do not take your medicine more often than directed. Do not stop taking this medicine suddenly except upon the advice of your doctor. Stopping this medicine too quickly may cause serious side effects or your condition may worsen. A special MedGuide will be given to you by the pharmacist with each prescription and refill. Be sure to read this information carefully each time. Talk to your pediatrician regarding the use of this medicine in children. Special care may be needed. Overdosage: If you think you have taken too much of this medicine contact a poison control center or emergency room at once. NOTE: This medicine is only for you. Do not share this medicine with others. What if I miss a dose? If you miss a dose, take it as soon as you can. If it is almost time for your next dose, take only that dose. Do not take double or extra doses. What may interact with this medicine? Do not take this medicine with any of the following medications: -linezolid -MAOIs like Carbex, Eldepryl, Marplan, Nardil, and Parnate -methylene blue (injected into a vein) This medicine may also interact with the following  medications: -alcohol -antiviral medicines for HIV or AIDS -certain medicines that treat or prevent blood clots like warfarin -certain medicines for depression, anxiety, or psychotic disturbances -certain medicines for fungal infections like ketoconazole and itraconazole -certain medicines for migraine headache like almotriptan, eletriptan, frovatriptan, naratriptan, rizatriptan, sumatriptan, zolmitriptan -certain medicines for seizures like carbamazepine or phenytoin -certain medicines for sleep -cimetidine -erythromycin -fentanyl -lithium -medicines for blood pressure -nefazodone -rasagiline -rifampin -supplements like St. John's wort, kava kava, valerian -tramadol -tryptophan This list may not describe all possible interactions. Give your health care provider a list of all the medicines, herbs, non-prescription drugs, or dietary supplements you use. Also tell them if you smoke, drink alcohol, or use illegal drugs. Some items may interact with your medicine. What should I watch for while using this medicine? Tell your doctor if your symptoms do not get better or if they get worse. Visit your doctor or health care professional for regular checks on your progress. Because it may take several weeks to see the full effects of this medicine, it is important to continue your treatment as prescribed by your doctor. Patients and their families should watch out for new or worsening thoughts of suicide or depression. Also watch out for sudden changes in feelings such as feeling anxious, agitated, panicky, irritable, hostile, aggressive, impulsive, severely restless, overly excited and hyperactive, or not being able to sleep. If this happens, especially at the beginning of treatment or after a change in dose, call your health care professional. Dennis Bast may get drowsy or dizzy. Do not drive, use machinery, or do anything that needs mental alertness until you know how  this medicine affects you. Do not stand  or sit up quickly, especially if you are an older patient. This reduces the risk of dizzy or fainting spells. Alcohol may interfere with the effect of this medicine. Avoid alcoholic drinks. This medicine may cause dry eyes and blurred vision. If you wear contact lenses you may feel some discomfort. Lubricating drops may help. See your eye doctor if the problem does not go away or is severe. Your mouth may get dry. Chewing sugarless gum or sucking hard candy, and drinking plenty of water may help. Contact your doctor if the problem does not go away or is severe. What side effects may I notice from receiving this medicine? Side effects that you should report to your doctor or health care professional as soon as possible: -allergic reactions like skin rash, itching or hives, swelling of the face, lips, or tongue -anxious -changes in vision -chest pain -confusion -elevated mood, decreased need for sleep, racing thoughts, impulsive behavior -eye pain -fast, irregular heartbeat -feeling faint or lightheaded, falls -feeling agitated, angry, or irritable -fever or chills, sore throat -hallucination, loss of contact with reality -loss of balance or coordination -mouth sores -redness, blistering, peeling or loosening of the skin, including inside the mouth -restlessness, pacing, inability to keep still -seizures -stiff muscles -suicidal thoughts or other mood changes -trouble passing urine or change in the amount of urine -trouble sleeping -unusual bleeding or bruising -unusually weak or tired -vomiting Side effects that usually do not require medical attention (report to your doctor or health care professional if they continue or are bothersome): -change in appetite -constipation -dizziness -dry mouth -muscle aches or pains -nausea -tired -weight gain This list may not describe all possible side effects. Call your doctor for medical advice about side effects. You may report side effects  to FDA at 1-800-FDA-1088. Where should I keep my medicine? Keep out of the reach of children. Store at room temperature between 15 and 30 degrees C (59 and 86 degrees F) Protect from light and moisture. Throw away any unused medicine after the expiration date. NOTE: This sheet is a summary. It may not cover all possible information. If you have questions about this medicine, talk to your doctor, pharmacist, or health care provider.  2018 Elsevier/Gold Standard (2015-07-22 17:30:45)        PLEASE START TAKING REMERON 7.5 MG PO AT BEDTIME DAILY FOR DEPRESSION, ANXIETY AS WELL AS SLEEP.  USE TRAZODONE 50 MG ONLY AS NEEDED , IF YOU DO NOT SLEEP GOOD ON REMERON .YOU DO NOT HAVE TO TAKE IT EVERY DAY.  PLEASE USE XANAX ONLY FOR SEVERE ANXIETY SX, TRY TO WEAN OFF YOURSELF .

## 2017-01-02 NOTE — Progress Notes (Signed)
MD Progress Note  01/02/2017 12:02 PM Bruce [name]  MRN:  [id number]  Chief Complaint: " I am anxious , I feel sad." Chief Complaint    Follow-up; Medication Refill     HPI: Bruce [name] is a 71 year old Caucasian male, who lives in [place], divorced, has a history of depression and anxiety as well as multiple medical problems, who presented to the clinic today for a follow-up visit.   Bruce [name] used to follow up with Dr.Faheem.  Bruce [name] reports that he continues to struggle with depression and anxiety symptoms.  He reports that he is not on any medications right now.  He does not know what happened to his Zoloft .However, as per previous progress note , zoloft was started in May 2018.  He reports that he may have just not taken it or his other providers would have taken him off of it.  He reports that he ran out of his Xanax which he used to take as needed for anxiety symptoms.  He reports that ever since that he has been having more anxiety attacks.  He reports that his son describes him as difficult to be around when he is very anxious.  He reports feeling tense and agitated.  He also reports sadness on a regular basis, some crying spells, feeling helpless, as well as lack of appetite.  He also reports sleep problems.  He reports that he sleeps a little bit better on trazodone 200 mg.However sleep continues to be a problem.  He reports that he has a history of coronary artery disease, colon cancer.  He reports that colon cancer is under control.  He is compliant on his medications for his multiple medical issues.  He follows up with his primary medical doctor on a regular basis.  He enjoys taking care of his horses, as well as his dog.  He reports that he spends a lot of time outdoors, exercises by doing chores outside his house on a regular basis.   Visit Diagnosis:    ICD-10-CM   1. Mild episode of recurrent major depressive disorder (HCC) F33.0 mirtazapine (REMERON) 15 MG tablet    traZODone  (DESYREL) 50 MG tablet  2. Generalized anxiety disorder F41.1 mirtazapine (REMERON) 15 MG tablet    ALPRAZolam (XANAX) 0.25 MG tablet  3. Insomnia due to mental condition F51.05     Past Psychiatric History: He reports he was first diagnosed with depression after his cardiac issues in 2012.  He used to follow up with Dr. for him.  He denies past inpatient mental health admissions.  He denies suicide attempts.  He has a history of hallucinations when he took Singulair in the past. Past trials of Zoloft.( unknown why discontinued, likely noncompliant)  Past Medical History:  Past Medical History:  Diagnosis Date  . Anginal pain ([place])    Left side if chest ,NTG  relieves chaes apin 12/01/13  . Anxiety   . Arthritis   . Cancer [facility])    colon cancer  . CHF (congestive heart failure) ([place])   . 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 Complication of anesthesia    wake up with a head ache  . COPD (chronic obstructive pulmonary disease) ([place])   . Coronary artery disease   . GERD (gastroesophageal reflux disease)   . Headache    related to sinus congestion  . History of blood transfusion   . History of kidney stones   . Hypertension   .  Myocardial infarction [facility])    '09 AND '12  . Peripheral vascular disease ([place])   . Shortness of breath    With exertion .  Marland Kitchen UTI (urinary tract infection)    frequent UTI    Past Surgical History:  Procedure Laterality Date  . CARDIAC CATHETERIZATION    . CHOLECYSTECTOMY    . EXTERNAL FIXATION LEG Right 10/06/2013   Procedure: CLOSED REDUCTION RIGHT TIBIAL PLATEAU FRACTURE, EXTERNAL FIXATION RIGHT LEG, PLACEMENT OF WOUND VAC;  Surgeon: Rozanna Box, MD;  Location: [place];  Service: Orthopedics;  Laterality: Right;  . FEMORAL-POPLITEAL BYPASS GRAFT Right 10/06/2013   Procedure: RIGHT POPLITEAL-POPLITEAL ARTERY BYPASS GRAFT;  Surgeon: Rosetta Posner, MD;  Location: [place];  Service: Vascular;  Laterality:  Right;  . FRACTURE SURGERY Right 2014  . HARDWARE REMOVAL Right 12/02/2013   Procedure: REMOVAL EXTERNAL FIXATION RIGHT LEG ;  Surgeon: Rozanna Box, MD;  Location: [place];  Service: Orthopedics;  Laterality: Right;  . HERNIA REPAIR Right 1990's  . I&D EXTREMITY Right 10/09/2013   Procedure: IRRIGATION AND DEBRIDEMENT RIGHT LEG, CLOSURE  OF WOUNDS, PLACEMENT OF WOUND VAC ON EACH SIDE OF LEG;  Surgeon: Rozanna Box, MD;  Location: [place];  Service: Orthopedics;  Laterality: Right;  . IR NEPHROSTOMY PLACEMENT LEFT  06/19/2016  . IVC filter  2009   placed @ [facility]/ Removed in 2010.  . IVC Filter Removed    . ligament leg Left   . NEPHROLITHOTOMY Left 06/19/2016   Procedure: NEPHROLITHOTOMY PERCUTANEOUS;  Surgeon: Hollice Espy, MD;  Location: [facility];  Service: Urology;  Laterality: Left;  . ORIF TIBIA FRACTURE Right 12/29/2014   Procedure: OPEN REDUCTION INTERNAL FIXATION (ORIF) RIGHT TIBIA FRACTURE, RIA VS ICBG;  Surgeon: Altamese [place], MD;  Location: [place];  Service: Orthopedics;  Laterality: Right;  . RADIOACTIVE SEED IMPLANT N/A 09/12/2016   Procedure: RADIOACTIVE SEED IMPLANT/BRACHYTHERAPY IMPLANT;  Surgeon: Hollice Espy, MD;  Location: [facility];  Service: Urology;  Laterality: N/A;    Family Psychiatric History: Denies history of mental health problems in his family.  Denies history of suicide in his family.  Denies history of substance abuse in his family.  Family History:  Family History  Problem Relation Age of Onset  . Hypertension Mother   . Prostate cancer Brother   . Mental illness Neg Hx     Social History: He lives in [place] by himself.  He was married once, divorced.  He has 2 adult sons.  He has good relationship with one of them. Social History   Social History  . Marital status: Single    Spouse name: N/A  . Number of children: N/A  . Years of education: N/A   Social History Main Topics  . Smoking status: Current Some Day Smoker    Packs/day: 0.50    Years:  52.00  . Smokeless tobacco: Never Used  . Alcohol use No     Comment: Stopped 2009  . Drug use: No  . Sexual activity: Not Currently   Other Topics Concern  . None   Social History Narrative   ** Merged History Encounter **        Allergies:  Allergies  Allergen Reactions  . Adhesive [Tape] Other (See Comments)    After right leg fracture surgery, pt developed a large blister where tape was applied to his right leg. OK to use paper tape.  . Imdur [Isosorbide Dinitrate] Other (See Comments)    hallucinations  . Singulair [  Montelukast Sodium] Other (See Comments)    Hallucinations     Metabolic Disorder Labs: No results found for: HGBA1C, MPG No results found for: PROLACTIN No results found for: CHOL, TRIG, HDL, CHOLHDL, VLDL, LDLCALC No results found for: TSH  Therapeutic Level Labs: No results found for: LITHIUM No results found for: VALPROATE No components found for:  CBMZ  Current Medications: Current Outpatient Prescriptions  Medication Sig Dispense Refill  . allopurinol (ZYLOPRIM) 100 MG tablet Take 100 mg by mouth at bedtime.    Marland Kitchen atorvastatin (LIPITOR) 80 MG tablet Take 80 mg by mouth at bedtime.     . benazepril (LOTENSIN) 20 MG tablet Take 20 mg by mouth daily.     Marland Kitchen CALCIUM CITRATE PO Take 250 mg by mouth at bedtime.     . Cetirizine HCl (ALL DAY ALLERGY PO) Take 1 tablet by mouth daily.    . cholecalciferol (VITAMIN D) 1000 units tablet Take 1,000 Units by mouth daily.     . DOCOSAHEXAENOIC ACID PO Take 1 capsule by mouth daily.     Marland Kitchen docusate sodium (COLACE) 100 MG capsule Take 100 mg by mouth at bedtime.    . fluticasone (FLONASE) 50 MCG/ACT nasal spray Place 1 spray into both nostrils daily.    . furosemide (LASIX) 20 MG tablet Take 20 mg by mouth daily.    Marland Kitchen loperamide (IMODIUM) 2 MG capsule Take by mouth as needed for diarrhea or loose stools.    . metoprolol succinate (TOPROL-XL) 50 MG 24 hr tablet Take 1 tablet (50 mg total) by mouth daily.  (Patient taking differently: Take 50 mg by mouth at bedtime. ) 90 tablet 0  . montelukast (SINGULAIR) 10 MG tablet     . Multiple Vitamin (MULTIVITAMIN) tablet Take 1 tablet by mouth daily. For Men    . nitroGLYCERIN (NITROSTAT) 0.4 MG SL tablet Place 0.4 mg under the tongue every 5 (five) minutes as needed for chest pain.     . Omega-3 Fatty Acids (FISH OIL PO) Take 1,000 mg by mouth daily.     Marland Kitchen omeprazole (PRILOSEC) 40 MG capsule Take 40 mg by mouth daily.    . potassium chloride (K-DUR) 10 MEQ tablet Take 10 mEq by mouth daily.     . Probiotic Product (PROBIOTIC FORMULA PO) Take 1 capsule by mouth at bedtime.     . ranolazine (RANEXA) 1000 MG SR tablet Take 1,000 mg by mouth every morning.     . rivaroxaban (XARELTO) 20 MG TABS tablet Take 20 mg by mouth daily with supper.    . tamsulosin (FLOMAX) 0.4 MG CAPS capsule Take 1 capsule (0.4 mg total) by mouth daily after supper. 30 capsule 6  . ALPRAZolam (XANAX) 0.25 MG tablet Take 1 tablet (0.25 mg total) by mouth daily as needed for anxiety. 30 tablet 1  . mirtazapine (REMERON) 15 MG tablet Take 0.5 tablets (7.5 mg total) by mouth at bedtime. 15 tablet 1  . traZODone (DESYREL) 50 MG tablet Take 1 tablet (50 mg total) by mouth at bedtime as needed for sleep. 30 tablet 1   No current facility-administered medications for this visit.      Musculoskeletal: Strength & Muscle Tone: within normal limits Gait & Station: normal Patient leans: N/A  Psychiatric Specialty Exam: Review of Systems  Psychiatric/Behavioral: Positive for depression. The patient is nervous/anxious and has insomnia.   All other systems reviewed and are negative.   Blood pressure 132/82, pulse 62, temperature 97.7 F (36.5 C), temperature source  Oral, weight 207 lb 3.2 oz (94 kg).Body mass index is 27.34 kg/m.  General Appearance: Casual  Eye Contact:  Fair  Speech:  Clear and Coherent  Volume:  Normal  Mood:  Anxious and Depressed  Affect:  Congruent  Thought  Process:  Goal Directed and Descriptions of Associations: Intact  Orientation:  Full (Time, Place, and Person)  Thought Content: Logical   Suicidal Thoughts:  No  Homicidal Thoughts:  No  Memory:  Immediate;   Fair Recent;   Fair Remote;   Fair  Judgement:  Fair  Insight:  Fair  Psychomotor Activity:  Normal  Concentration:  Concentration: Fair and Attention Span: Fair  Recall:  [employer] of Knowledge: Fair  Language: Fair  Akathisia:  No  Handed:  Right  AIMS (if indicated): NA  Assets:  Communication Skills Desire for Improvement Social Support  ADL's:  Intact  Cognition: WNL  Sleep:  Poor   Screenings: PHQ2-9     Follow Up  from 10/10/2016 in [place]  PHQ-2 Total Score  0       Assessment and Plan: Nellie is a 71 year old Caucasian male who has a history of depression, anxiety, insomnia as well as multiple medical problems.  Jonanthony has been noncompliant with his medications which includes Xanax, Zoloft as well as trazodone.  Devonne reports his depression and anxiety as worsening.  He struggles with lack of appetite as well as insomnia.He wants to get back on his medications.  Rajon currently denies any suicidal ideation.  He is a good candidate for outpatient treatment.   Plan For depression  He was noncompliant on Zoloft.  We will discontinue that same.  He reports he never took it. Remeron 7.5 mg p.o. Nightly.  Anxiety Continue Xanax 0.25 mg p.o. daily as needed. Discussed risk of benzodiazepine.  Given written instruction to slowly wean off.  He reports he will try to do so. Remeron 7.5 mg p.o. Nightly Reviewed [place] controlled substance database.  For insomnia Remeron 7.5 mg p.o. Nightly Continue trazodone at reduced dose of 50 mg p.o. nightly as needed  Reviewed medical records, notes per Dr.Faheem.  More than 50 % of the time was spent for psychoeducation and supportive psychotherapy and care coordination.   This  note was generated in part or whole with voice recognition software. Voice recognition is usually quite accurate but there are transcription errors that can and very often do occur. I apologize for any typographical errors that were not detected and corrected.       Ursula Alert, MD 01/02/2017, 12:02 PM

## 2017-01-31 ENCOUNTER — Ambulatory Visit: Payer: Medicare Other | Admitting: Psychiatry

## 2017-02-05 ENCOUNTER — Inpatient Hospital Stay: Payer: Medicare Other | Attending: Radiation Oncology

## 2017-02-05 ENCOUNTER — Encounter: Payer: Self-pay | Admitting: Psychiatry

## 2017-02-05 ENCOUNTER — Ambulatory Visit (INDEPENDENT_AMBULATORY_CARE_PROVIDER_SITE_OTHER): Payer: Medicare Other | Admitting: Psychiatry

## 2017-02-05 VITALS — BP 114/76 | HR 83 | Temp 98.3°F | Wt 210.6 lb

## 2017-02-05 DIAGNOSIS — F330 Major depressive disorder, recurrent, mild: Secondary | ICD-10-CM | POA: Diagnosis not present

## 2017-02-05 DIAGNOSIS — Z923 Personal history of irradiation: Secondary | ICD-10-CM | POA: Diagnosis not present

## 2017-02-05 DIAGNOSIS — C61 Malignant neoplasm of prostate: Secondary | ICD-10-CM | POA: Diagnosis present

## 2017-02-05 DIAGNOSIS — F5105 Insomnia due to other mental disorder: Secondary | ICD-10-CM | POA: Diagnosis not present

## 2017-02-05 DIAGNOSIS — F411 Generalized anxiety disorder: Secondary | ICD-10-CM

## 2017-02-05 LAB — CBC
HEMATOCRIT: 37.2 % — AB (ref 40.0–52.0)
HEMOGLOBIN: 13 g/dL (ref 13.0–18.0)
MCH: 33.8 pg (ref 26.0–34.0)
MCHC: 34.9 g/dL (ref 32.0–36.0)
MCV: 97 fL (ref 80.0–100.0)
Platelets: 208 10*3/uL (ref 150–440)
RBC: 3.83 MIL/uL — AB (ref 4.40–5.90)
RDW: 13.7 % (ref 11.5–14.5)
WBC: 7 10*3/uL (ref 3.8–10.6)

## 2017-02-05 MED ORDER — TRAZODONE HCL 150 MG PO TABS: 150.0000 mg | ORAL_TABLET | Freq: Every day | ORAL | 2 refills | Status: DC

## 2017-02-05 MED ORDER — MIRTAZAPINE 15 MG PO TABS: 15.0000 mg | ORAL_TABLET | Freq: Every day | ORAL | 1 refills | Status: DC

## 2017-02-05 NOTE — Progress Notes (Signed)
[facility] MD OP Progress Note  02/05/2017 2:27 PM ISAIR INABINET  MRN:  [id number]  Chief Complaint: " I am still not sleeping."  Chief Complaint    Follow-up; Medication Refill     HPI: Leviathan is a 71 year old Caucasian male, who lives in [place], divorced, has a history of depression and anxiety as well as multiple medical problems, who presented to the clinic today for a follow-up visit.  Rithik today reports that he continues to struggle with sleep issues.  He reports that he tried taking the new medication however he has been getting 1-1/2 hours of sleep at bedtime and that has been an ongoing issue with him.  He reports that he does not know if he were taking the medications as prescribed.  He reports that his son helps him to get his medications on a daily basis however, he reports that he may have been taking 2 tablets of the mirtazapine instead of half.  He however is not sure.  He denies any side effects to the medications.  He denies any perceptual disturbances at this time.  He denies any suicidality or homicidality.  He reports he has been limiting the use of alprazolam and can go 3-4 days without taking it.  Denies any new concerns.  Continues to enjoy taking care of his horses, as well as his dog.  He reports that he spends a lot of time outdoors, and exercises by doing household chores on a regular basis.  His colon cancer continues to be in remission.  He continues to follow-up with his PMD on a regular basis.  He was recently treated for seasonal allergies.  Visit Diagnosis:    ICD-10-CM   1. Mild episode of recurrent major depressive disorder (HCC) F33.0 mirtazapine (REMERON) 15 MG tablet    traZODone (DESYREL) 150 MG tablet  2. Generalized anxiety disorder F41.1 mirtazapine (REMERON) 15 MG tablet  3. Insomnia due to mental disorder F51.05     Past Psychiatric History: Reports he was first diagnosed with depression after his cardiac issues in 2012.  He used to follow up  with Dr.Faheem.  He denies past inpatient mental health admissions.  He denies suicide attempts.  He has a history of hallucinations when he took Singulair in the past.  Past trials of Zoloft.  Past Medical History:  Past Medical History:  Diagnosis Date  . Anginal pain ([place])    Left side if chest ,NTG  relieves chaes apin 12/01/13  . Anxiety   . Arthritis   . Cancer [facility])    colon cancer  . CHF (congestive heart failure) ([place])   . 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 Complication of anesthesia    wake up with a head ache  . COPD (chronic obstructive pulmonary disease) ([place])   . Coronary artery disease   . GERD (gastroesophageal reflux disease)   . Headache    related to sinus congestion  . History of blood transfusion   . History of kidney stones   . Hypertension   . Myocardial infarction [facility])    '09 AND '12  . Peripheral vascular disease ([place])   . Shortness of breath    With exertion .  Marland Kitchen UTI (urinary tract infection)    frequent UTI    Past Surgical History:  Procedure Laterality Date  . CARDIAC CATHETERIZATION    . CHOLECYSTECTOMY    . EXTERNAL FIXATION LEG Right 10/06/2013   Procedure: CLOSED REDUCTION  RIGHT TIBIAL PLATEAU FRACTURE, EXTERNAL FIXATION RIGHT LEG, PLACEMENT OF WOUND VAC;  Surgeon: Rozanna Box, MD;  Location: [place];  Service: Orthopedics;  Laterality: Right;  . FEMORAL-POPLITEAL BYPASS GRAFT Right 10/06/2013   Procedure: RIGHT POPLITEAL-POPLITEAL ARTERY BYPASS GRAFT;  Surgeon: Rosetta Posner, MD;  Location: [place];  Service: Vascular;  Laterality: Right;  . FRACTURE SURGERY Right 2014  . HARDWARE REMOVAL Right 12/02/2013   Procedure: REMOVAL EXTERNAL FIXATION RIGHT LEG ;  Surgeon: Rozanna Box, MD;  Location: [place];  Service: Orthopedics;  Laterality: Right;  . HERNIA REPAIR Right 1990's  . I&D EXTREMITY Right 10/09/2013   Procedure: IRRIGATION AND DEBRIDEMENT RIGHT LEG, CLOSURE  OF WOUNDS, PLACEMENT OF WOUND  VAC ON EACH SIDE OF LEG;  Surgeon: Rozanna Box, MD;  Location: [place];  Service: Orthopedics;  Laterality: Right;  . IR NEPHROSTOMY PLACEMENT LEFT  06/19/2016  . IVC filter  2009   placed @ [facility]/ Removed in 2010.  . IVC Filter Removed    . ligament leg Left   . NEPHROLITHOTOMY Left 06/19/2016   Procedure: NEPHROLITHOTOMY PERCUTANEOUS;  Surgeon: Hollice Espy, MD;  Location: [facility];  Service: Urology;  Laterality: Left;  . ORIF TIBIA FRACTURE Right 12/29/2014   Procedure: OPEN REDUCTION INTERNAL FIXATION (ORIF) RIGHT TIBIA FRACTURE, RIA VS ICBG;  Surgeon: Altamese Naranjito, MD;  Location: [place];  Service: Orthopedics;  Laterality: Right;  . RADIOACTIVE SEED IMPLANT N/A 09/12/2016   Procedure: RADIOACTIVE SEED IMPLANT/BRACHYTHERAPY IMPLANT;  Surgeon: Hollice Espy, MD;  Location: [facility];  Service: Urology;  Laterality: N/A;    Family Psychiatric History: Denies history of mental health problems in his family.  Denies history of suicide in his family.  Denies history of substance abuse in his family.  Family History:  Family History  Problem Relation Age of Onset  . Hypertension Mother   . Prostate cancer Brother   . Mental illness Neg Hx     Social History: Lives in [place] by himself.  He was married once, divorced.  He has 2 adult sons.  He has good relationship with 1 of them.  He reports he has several friends. Social History   Socioeconomic History  . Marital status: Single    Spouse name: None  . Number of children: 2  . Years of education: None  . Highest education level: 6th grade  Social Needs  . Financial resource strain: Very hard  . Food insecurity - worry: Often true  . Food insecurity - inability: Often true  . Transportation needs - medical: No  . Transportation needs - non-medical: No  Occupational History  . None  Tobacco Use  . Smoking status: Current Some Day Smoker    Packs/day: 0.50    Years: 52.00    Pack years: 26.00  . Smokeless tobacco: Never  Used  Substance and Sexual Activity  . Alcohol use: No    Alcohol/week: 0.0 oz    Comment: Stopped 2009  . Drug use: No  . Sexual activity: Not Currently  Other Topics Concern  . None  Social History Narrative   ** Merged History Encounter **        Allergies:  Allergies  Allergen Reactions  . Adhesive [Tape] Other (See Comments)    After right leg fracture surgery, pt developed a large blister where tape was applied to his right leg. OK to use paper tape.  . Imdur [Isosorbide Dinitrate] Other (See Comments)    hallucinations  . Singulair [employer]  Sodium] Other (See Comments)    Hallucinations     Metabolic Disorder Labs: No results found for: HGBA1C, MPG No results found for: PROLACTIN No results found for: CHOL, TRIG, HDL, CHOLHDL, VLDL, LDLCALC No results found for: TSH  Therapeutic Level Labs: No results found for: LITHIUM No results found for: VALPROATE No components found for:  CBMZ  Current Medications: Current Outpatient Medications  Medication Sig Dispense Refill  . allopurinol (ZYLOPRIM) 100 MG tablet Take 100 mg by mouth at bedtime.    . ALPRAZolam (XANAX) 0.25 MG tablet Take 1 tablet (0.25 mg total) by mouth daily as needed for anxiety. 30 tablet 1  . atorvastatin (LIPITOR) 80 MG tablet Take 80 mg by mouth at bedtime.     . benazepril (LOTENSIN) 20 MG tablet Take 20 mg by mouth daily.     Marland Kitchen CALCIUM CITRATE PO Take 250 mg by mouth at bedtime.     . Cetirizine HCl (ALL DAY ALLERGY PO) Take 1 tablet by mouth daily.    . cholecalciferol (VITAMIN D) 1000 units tablet Take 1,000 Units by mouth daily.     . DOCOSAHEXAENOIC ACID PO Take 1 capsule by mouth daily.     Marland Kitchen docusate sodium (COLACE) 100 MG capsule Take 100 mg by mouth at bedtime.    . fluticasone (FLONASE) 50 MCG/ACT nasal spray Place 1 spray into both nostrils daily.    . furosemide (LASIX) 20 MG tablet Take 20 mg by mouth daily.    Marland Kitchen loperamide (IMODIUM) 2 MG capsule Take by mouth as needed for  diarrhea or loose stools.    . metoprolol succinate (TOPROL-XL) 50 MG 24 hr tablet Take 1 tablet (50 mg total) by mouth daily. (Patient taking differently: Take 50 mg by mouth at bedtime. ) 90 tablet 0  . mirtazapine (REMERON) 15 MG tablet Take 1 tablet (15 mg total) by mouth at bedtime. 30 tablet 1  . montelukast (SINGULAIR) 10 MG tablet     . Multiple Vitamin (MULTIVITAMIN) tablet Take 1 tablet by mouth daily. For Men    . nitroGLYCERIN (NITROSTAT) 0.4 MG SL tablet Place 0.4 mg under the tongue every 5 (five) minutes as needed for chest pain.     . Omega-3 Fatty Acids (FISH OIL PO) Take 1,000 mg by mouth daily.     Marland Kitchen omeprazole (PRILOSEC) 40 MG capsule Take 40 mg by mouth daily.    . potassium chloride (K-DUR) 10 MEQ tablet Take 10 mEq by mouth daily.     . Probiotic Product (PROBIOTIC FORMULA PO) Take 1 capsule by mouth at bedtime.     . ranolazine (RANEXA) 1000 MG SR tablet Take 1,000 mg by mouth every morning.     . rivaroxaban (XARELTO) 20 MG TABS tablet Take 20 mg by mouth daily with supper.    . tamsulosin (FLOMAX) 0.4 MG CAPS capsule Take 1 capsule (0.4 mg total) by mouth daily after supper. 30 capsule 6  . gabapentin (NEURONTIN) 300 MG capsule     . traZODone (DESYREL) 150 MG tablet Take 1 tablet (150 mg total) by mouth at bedtime. 30 tablet 2   No current facility-administered medications for this visit.      Musculoskeletal: Strength & Muscle Tone: within normal limits Gait & Station: normal Patient leans: N/A  Psychiatric Specialty Exam: Review of Systems  Psychiatric/Behavioral: Positive for depression. The patient is nervous/anxious and has insomnia.   All other systems reviewed and are negative.   Blood pressure 114/76, pulse 83, temperature 98.3  F (36.8 C), temperature source Oral, weight 210 lb 9.6 oz (95.5 kg).Body mass index is 27.79 kg/m.  General Appearance: Casual  Eye Contact:  Fair  Speech:  Normal Rate  Volume:  Normal  Mood:  Anxious and Dysphoric   Affect:  Congruent  Thought Process:  Goal Directed and Descriptions of Associations: Intact  Orientation:  Full (Time, Place, and Person)  Thought Content: Logical   Suicidal Thoughts:  No  Homicidal Thoughts:  No  Memory:  Immediate;   Fair Recent;   Fair Remote;   Fair  Judgement:  Fair  Insight:  Fair  Psychomotor Activity:  Normal  Concentration:  Concentration: Fair and Attention Span: Fair  Recall:  [employer] of Knowledge: Fair  Language: Fair  Akathisia:  No  Handed:  Right  AIMS (if indicated): NA  Assets:  Communication Skills Desire for Improvement Housing  ADL's:  Intact  Cognition: WNL  Sleep:  Poor   Screenings: PHQ2-9     Follow Up  from 10/10/2016 in [place]  PHQ-2 Total Score  0       Assessment and Plan: Jaion is a 70 year old Caucasian male who has a history of depression, anxiety, insomnia as well as multiple medical problems.  Jeffie has a history of being noncompliant with medications in the past.  Jaise this visit reports he is compliant on his medications however he continues to struggle with medication dosage, he may not have been taking it the right way.  Discussed this at length with patient also provided written instructions.  We will continue medication management as below.  Plan For depression Increase Remeron to 15 mg p.o. Nightly.  For anxiety Xanax 0.25 mg p.o. daily as needed.  Discussed risk of benzodiazepine.  He continues to limit the use.  Remeron 15 mg p.o. Nightly.  Insomnia Increase trazodone to 150 mg p.o. Nightly. Remeron 15 mg p.o. Nightly.  Provided written instructions regarding medication dosage and scheduling.  Follow-up in 4 weeks or sooner if needed.  More than 50 % of the time was spent for psychoeducation and supportive psychotherapy and care coordination.   This note was generated in part or whole with voice recognition software. Voice recognition is usually quite  accurate but there are transcription errors that can and very often do occur. I apologize for any typographical errors that were not detected and corrected.           Ursula Alert, MD 02/05/2017, 2:27 PM

## 2017-02-05 NOTE — Patient Instructions (Addendum)
PLEASE START TAKING REMERON ( MIRTAZAPINE) - 15 MG ( 1 TABLET) AT BEDTIME FOR DEPRESSION, ANXIETY, SLEEP.  PLEASE START TAKING TRAZODONE 150 MG TABLET AT BEDTIME FOR SLEEP. IF YOU HAVE 50 MG TABLETS AT HOME FROM YOUR PREVIOUS REFILL - PLEASE TAKE 3 TABLETS TO MAKE IT 150 MG. WHEN YOU RUN OUT OF IT , YOU CAN REFILL YOUR NEW PRESCRIPTION FOR 150 MG AND START TAKING IT.  PLEASE TRY TO LIMIT USE OF ALPRAZOLAM - USE IT ONLY AS NEEDED FOR ANXIETY SX.

## 2017-02-12 ENCOUNTER — Ambulatory Visit: Payer: Medicare Other | Admitting: Radiation Oncology

## 2017-03-02 ENCOUNTER — Other Ambulatory Visit: Payer: Self-pay | Admitting: Radiation Oncology

## 2017-03-05 ENCOUNTER — Ambulatory Visit: Payer: Medicare Other | Admitting: Psychiatry

## 2017-03-08 ENCOUNTER — Ambulatory Visit
Admission: RE | Admit: 2017-03-08 | Discharge: 2017-03-08 | Disposition: A | Discharge status: Home / Self Care | Payer: Medicare Other | Source: Ambulatory Visit | Attending: Radiation Oncology | Admitting: Radiation Oncology

## 2017-03-08 ENCOUNTER — Inpatient Hospital Stay: Payer: Medicare Other | Attending: Radiation Oncology

## 2017-03-08 ENCOUNTER — Other Ambulatory Visit: Payer: Self-pay | Admitting: *Deleted

## 2017-03-08 ENCOUNTER — Other Ambulatory Visit: Payer: Self-pay

## 2017-03-08 ENCOUNTER — Encounter: Payer: Self-pay | Admitting: Radiation Oncology

## 2017-03-08 VITALS — BP 139/90 | HR 69 | Temp 97.0°F | Resp 20 | Wt 205.8 lb

## 2017-03-08 DIAGNOSIS — C61 Malignant neoplasm of prostate: Secondary | ICD-10-CM

## 2017-03-08 DIAGNOSIS — Z923 Personal history of irradiation: Secondary | ICD-10-CM | POA: Diagnosis not present

## 2017-03-08 LAB — PSA: PROSTATIC SPECIFIC ANTIGEN: 0.43 ng/mL (ref 0.00–4.00)

## 2017-03-08 NOTE — Progress Notes (Signed)
Radiation Oncology Follow up Note  Name: Bruce [name]   Date:   03/08/2017 MRN:  [id number] DOB: Aug 27, 1945    This 72 y.o. male presents to the clinic today for 5 month follow-up status post I-125 interstitial implant for Gleason 7 adenocarcinoma the prostate.  REFERRING PROVIDER: Danelle Berry, NP  HPI: Patient is a 72 year old male now seen out 5 months. He is doing well specifically denies diarrhea or any increased lower urinary tract symptoms. He has nocturia 2. He had his PSA tested today and it is pending..  COMPLICATIONS OF TREATMENT: none  FOLLOW UP COMPLIANCE: keeps appointments   PHYSICAL EXAM:  BP 139/90   Pulse 69   Temp (!) 97 F (36.1 C)   Resp 20   Wt 205 lb 12.8 oz (93.3 kg)   BMI 27.15 kg/m  On rectal exam rectal sphincter tone is good. Prostate is smooth contracted without evidence of nodularity or mass. Sulcus is preserved bilaterally. No discrete nodularity is identified. No other rectal abnormalities are noted. Well-developed well-nourished patient in NAD. HEENT reveals PERLA, EOMI, discs not visualized.  Oral cavity is clear. No oral mucosal lesions are identified. Neck is clear without evidence of cervical or supraclavicular adenopathy. Lungs are clear to A&P. Cardiac examination is essentially unremarkable with regular rate and rhythm without murmur rub or thrill. Abdomen is benign with no organomegaly or masses noted. Motor sensory and DTR levels are equal and symmetric in the upper and lower extremities. Cranial nerves II through XII are grossly intact. Proprioception is intact. No peripheral adenopathy or edema is identified. No motor or sensory levels are noted. Crude visual fields are within normal range.  RADIOLOGY RESULTS: No current films for review  PLAN: Present time from a symptom standpoint he is doing extremely well. Will report his PSA separately. Otherwise I'm please was overall progress. I've asked to see him back in 6 months for follow-up.  Patient knows to call sooner with any concerns.  I would like to take this opportunity to thank you for allowing me to participate in the care of your patient.Noreene Filbert, MD

## 2017-04-24 ENCOUNTER — Ambulatory Visit: Payer: Medicare Other | Admitting: Urology

## 2017-04-24 ENCOUNTER — Encounter: Payer: Self-pay | Admitting: Urology

## 2017-04-24 NOTE — Progress Notes (Deleted)
04/24/2017 8:15 AM   Bruce [name] March 26, 1945 [id number]  Referring provider: Danelle Berry, NP [address] [place], [place] [address]  No chief complaint on file.   HPI: 72 year old male with prostate cancer and h/o staghorn calculus s/p PCNL  Prostate cancer He initially underwent prostate biopsy secondary to a PSA of 13.5 on 02/20/2016.   Repeat PSA was 18.5 on 04/06/2016.  Rectal exam was enlarged without nodules.  TRUS volume 56 g with intravesical protrusion of the median lobe (on cysto/ CT).  Prostate biopsy pathology c/w Gleason 4+3 and 3+3 involving 4 of 12 cores, all on the right side.  Specifically, the right lateral base, the core comprises of 70% of Gleason 4 tumor involving up to 94% of the tissue.  CT abdomen pelvis with and without contrast on 05/09/2016 shows no evidence of lymphadenopathy or metastatic disease, although small mesenteric and retroperitoneal lymph nodes were noted but not reaching pathologic size.    Bone scan showed no evidence of metastatic disease.  He is elected to proceed with external beam radiation with brachytherapy boost by Dr. Baruch Gouty.  He underwent brachytherapy placement on 09/12/2016 and returns today for follow-up of this.  He is also on 6 months Lupron.    Hisotry of  nephrolithiasis 2.5 cm left partial staghorn status post uncomplicated left PCNL on 06/19/2016.  He denies knowing about having a kidney stone prior to this. Review of previous older images including a renal ultrasound dating back to 2011 do show calcification within the left kidney, measuring 1.6 cm at that time.  Postoperatively, he has had no issues. He had drainage from his left flank incision for a few days afterwards but this is since result. No significant flank pain, dysuria, or hematuria. He feels overall well.  Stone analysis shows 90% calcium oxalate monohydrate, 10% calcium phosphate.  Follow-up KUB and renal ultrasound unremarkable, no  evidence of residual stones or obstruction.    PMH: Past Medical History:  Diagnosis Date  . Anginal pain ([place])    Left side if chest ,NTG  relieves chaes apin 12/01/13  . Anxiety   . Arthritis   . Cancer [facility])    colon cancer  . CHF (congestive heart failure) ([place])   . 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 Complication of anesthesia    wake up with a head ache  . COPD (chronic obstructive pulmonary disease) ([place])   . Coronary artery disease   . GERD (gastroesophageal reflux disease)   . Headache    related to sinus congestion  . History of blood transfusion   . History of kidney stones   . Hypertension   . Myocardial infarction [facility])    '09 AND '12  . Peripheral vascular disease ([place])   . Shortness of breath    With exertion .  Marland Kitchen UTI (urinary tract infection)    frequent UTI    Surgical History: Past Surgical History:  Procedure Laterality Date  . CARDIAC CATHETERIZATION    . CHOLECYSTECTOMY    . EXTERNAL FIXATION LEG Right 10/06/2013   Procedure: CLOSED REDUCTION RIGHT TIBIAL PLATEAU FRACTURE, EXTERNAL FIXATION RIGHT LEG, PLACEMENT OF WOUND VAC;  Surgeon: Rozanna Box, MD;  Location: [place];  Service: Orthopedics;  Laterality: Right;  . FEMORAL-POPLITEAL BYPASS GRAFT Right 10/06/2013   Procedure: RIGHT POPLITEAL-POPLITEAL ARTERY BYPASS GRAFT;  Surgeon: Rosetta Posner, MD;  Location: [place];  Service: Vascular;  Laterality: Right;  . FRACTURE SURGERY  Right 2014  . HARDWARE REMOVAL Right 12/02/2013   Procedure: REMOVAL EXTERNAL FIXATION RIGHT LEG ;  Surgeon: Rozanna Box, MD;  Location: [place];  Service: Orthopedics;  Laterality: Right;  . HERNIA REPAIR Right 1990's  . I&D EXTREMITY Right 10/09/2013   Procedure: IRRIGATION AND DEBRIDEMENT RIGHT LEG, CLOSURE  OF WOUNDS, PLACEMENT OF WOUND VAC ON EACH SIDE OF LEG;  Surgeon: Rozanna Box, MD;  Location: [place];  Service: Orthopedics;  Laterality: Right;  . IR NEPHROSTOMY  PLACEMENT LEFT  06/19/2016  . IVC filter  2009   placed @ [facility]/ Removed in 2010.  . IVC Filter Removed    . ligament leg Left   . NEPHROLITHOTOMY Left 06/19/2016   Procedure: NEPHROLITHOTOMY PERCUTANEOUS;  Surgeon: Hollice Espy, MD;  Location: [facility];  Service: Urology;  Laterality: Left;  . ORIF TIBIA FRACTURE Right 12/29/2014   Procedure: OPEN REDUCTION INTERNAL FIXATION (ORIF) RIGHT TIBIA FRACTURE, RIA VS ICBG;  Surgeon: Altamese , MD;  Location: [place];  Service: Orthopedics;  Laterality: Right;  . RADIOACTIVE SEED IMPLANT N/A 09/12/2016   Procedure: RADIOACTIVE SEED IMPLANT/BRACHYTHERAPY IMPLANT;  Surgeon: Hollice Espy, MD;  Location: [facility];  Service: Urology;  Laterality: N/A;    Home Medications:  Allergies as of 04/24/2017      Reactions   Adhesive [tape] Other (See Comments)   After right leg fracture surgery, pt developed a large blister where tape was applied to his right leg. OK to use paper tape.   Imdur [isosorbide Dinitrate] Other (See Comments)   hallucinations   Singulair [montelukast Sodium] Other (See Comments)   Hallucinations      Medication List        Accurate as of 04/24/17  8:15 AM. Always use your most recent med list.          ALL DAY ALLERGY PO Take 1 tablet by mouth daily.   allopurinol 100 MG tablet Commonly known as:  ZYLOPRIM Take 100 mg by mouth at bedtime.   ALPRAZolam 0.25 MG tablet Commonly known as:  XANAX Take 1 tablet (0.25 mg total) by mouth daily as needed for anxiety.   atorvastatin 80 MG tablet Commonly known as:  LIPITOR Take 80 mg by mouth at bedtime.   benazepril 20 MG tablet Commonly known as:  LOTENSIN Take 20 mg by mouth daily.   CALCIUM CITRATE PO Take 250 mg by mouth at bedtime.   cholecalciferol 1000 units tablet Commonly known as:  VITAMIN D Take 1,000 Units by mouth daily.   DOCOSAHEXAENOIC ACID PO Take 1 capsule by mouth daily.   docusate sodium 100 MG capsule Commonly known as:  COLACE Take  100 mg by mouth at bedtime.   FISH OIL PO Take 1,000 mg by mouth daily.   fluticasone 50 MCG/ACT nasal spray Commonly known as:  FLONASE Place 1 spray into both nostrils daily.   furosemide 20 MG tablet Commonly known as:  LASIX Take 20 mg by mouth daily.   gabapentin 300 MG capsule Commonly known as:  NEURONTIN   loperamide 2 MG capsule Commonly known as:  IMODIUM Take by mouth as needed for diarrhea or loose stools.   metoprolol succinate 50 MG 24 hr tablet Commonly known as:  TOPROL-XL Take 1 tablet (50 mg total) by mouth daily.   mirtazapine 15 MG tablet Commonly known as:  REMERON Take 1 tablet (15 mg total) by mouth at bedtime.   montelukast 10 MG tablet Commonly known as:  SINGULAIR   multivitamin  tablet Take 1 tablet by mouth daily. For Men   nitroGLYCERIN 0.4 MG SL tablet Commonly known as:  NITROSTAT Place 0.4 mg under the tongue every 5 (five) minutes as needed for chest pain.   omeprazole 40 MG capsule Commonly known as:  PRILOSEC Take 40 mg by mouth daily.   potassium chloride 10 MEQ tablet Commonly known as:  K-DUR Take 10 mEq by mouth daily.   PROBIOTIC FORMULA PO Take 1 capsule by mouth at bedtime.   RANEXA 1000 MG SR tablet Generic drug:  ranolazine Take 1,000 mg by mouth every morning.   rivaroxaban 20 MG Tabs tablet Commonly known as:  XARELTO Take 20 mg by mouth daily with supper.   tamsulosin 0.4 MG Caps capsule Commonly known as:  FLOMAX TAKE 1 CAPSULE BY MOUTH ONCE DAILY AFTERSUPPER   traZODone 150 MG tablet Commonly known as:  DESYREL Take 1 tablet (150 mg total) by mouth at bedtime.       Allergies:  Allergies  Allergen Reactions  . Adhesive [Tape] Other (See Comments)    After right leg fracture surgery, pt developed a large blister where tape was applied to his right leg. OK to use paper tape.  . Imdur [Isosorbide Dinitrate] Other (See Comments)    hallucinations  . Singulair [Montelukast Sodium] Other (See  Comments)    Hallucinations     Family History: Family History  Problem Relation Age of Onset  . Hypertension Mother   . Prostate cancer Brother   . Mental illness Neg Hx     Social History:  reports that he has been smoking.  He has a 26.00 pack-year smoking history. he has never used smokeless tobacco. He reports that he does not drink alcohol or use drugs.  ROS:                                        Physical Exam: There were no vitals taken for this visit.  Constitutional:  Alert and oriented, No acute distress. HEENT: [place] AT, moist mucus membranes.  Trachea midline, no masses. Cardiovascular: No clubbing, cyanosis, or edema. Respiratory: Normal respiratory effort, no increased work of breathing. GI: Abdomen is soft, nontender, nondistended, no abdominal masses GU: No CVA tenderness. *** Skin: No rashes, bruises or suspicious lesions. Lymph: No cervical or inguinal adenopathy. Neurologic: Grossly intact, no focal deficits, moving all 4 extremities. Psychiatric: Normal mood and affect.  Laboratory Data: Lab Results  Component Value Date   WBC 7.0 02/05/2017   HGB 13.0 02/05/2017   HCT 37.2 (L) 02/05/2017   MCV 97.0 02/05/2017   PLT 208 02/05/2017    Lab Results  Component Value Date   CREATININE 1.44 (H) 06/20/2016    Lab Results  Component Value Date   PSA1 18.5 (H) 04/06/2016    No results found for: TESTOSTERONE  No results found for: HGBA1C  Urinalysis Lab Results  Component Value Date   SPECGRAV 1.015 04/10/2016   PHUR 6.0 04/10/2016   COLORU Yellow 04/10/2016   APPEARANCEUR CLOUDY (A) 06/12/2016   LEUKOCYTESUR NEGATIVE 06/12/2016   PROTEINUR 30 (A) 06/12/2016   GLUCOSEU NEGATIVE 06/12/2016   KETONESU Negative 04/10/2016   RBCU TOO NUMEROUS TO COUNT 06/12/2016   BILIRUBINUR NEGATIVE 06/12/2016   UUROB 0.2 04/10/2016   NITRITE NEGATIVE 06/12/2016    Lab Results  Component Value Date   LABMICR See below: 04/10/2016    [facility]  0-5 04/10/2016   RBCUA 3-10 (A) 04/10/2016   LABEPIT 0-10 04/10/2016   BACTERIA NONE SEEN 06/12/2016    Pertinent Imaging: *** Results for orders placed during the hospital encounter of 07/25/16  DG Abd 1 View   Narrative CLINICAL DATA:  History of multiple kidney stones.  EXAM: ABDOMEN - 1 VIEW  COMPARISON:  05/09/2016.  FINDINGS: The previously noted left renal calculi are no longer visualized. Right upper quadrant cholecystectomy clips noted. Normal bowel gas pattern. No dilated loops  IMPRESSION: Previously noted left renal calculi are no longer visualized.   Electronically Signed   By: Kerby Moors M.D.   On: 07/25/2016 14:58    No results found for this or any previous visit. No results found for this or any previous visit. No results found for this or any previous visit. Results for orders placed during the hospital encounter of 07/20/16  US Renal   Narrative CLINICAL DATA:  Staghorn calculus.  History of left nephrolithotomy.  EXAM: RENAL / URINARY TRACT ULTRASOUND COMPLETE  COMPARISON:  Spot images 416 2018.  CT 05/19/2016.  FINDINGS: Right Kidney:  Length: 9.9 cm. Renal cortical thinning . Echogenicity within normal limits. No mass or hydronephrosis visualized.  Left Kidney:  Length: 10.4 cm. Renal cortical thinning. Echogenicity within normal limits. No mass or hydronephrosis visualized. Previously identified low left staghorn calculus no longer visualized.  Bladder:  Bladder wall measures 5.3 mm. Slight thickening may be related to nondistention. A process such as cystitis or bladder wall hypertrophy cannot be excluded. The prostate is enlarged.  IMPRESSION: 1. Previously identified left staghorn calculus no longer identified. Bilateral renal cortical thinning. No acute renal abnormality. No hydronephrosis.  2. Bladder is nondistended. Mild bladder wall thickening noted. This may be from lack of distention however a process  such as cystitis or bladder wall hypertrophy cannot be excluded. The prostate is enlarged.   Electronically Signed   By: Marcello Moores  Register   On: 07/20/2016 13:34    No results found for this or any previous visit. No results found for this or any previous visit. No results found for this or any previous visit.  Assessment & Plan:  ***  There are no diagnoses linked to this encounter.  No Follow-up on file.  Hollice Espy, MD  [facility] [address], [place], [place] [address] [phone]

## 2017-05-17 IMAGING — US US RENAL
1 series · 14 of 25 positions shown · non-contrast
Comparison: Spot images 4[REDACTED].  CT 05/19/2016.

CLINICAL DATA: Staghorn calculus.  History of left nephrolithotomy.

EXAM:
RENAL / URINARY TRACT ULTRASOUND COMPLETE

[Series 1: us renal · 0.25mm/px · 14 of 38 slices shown]
[im 1/38]
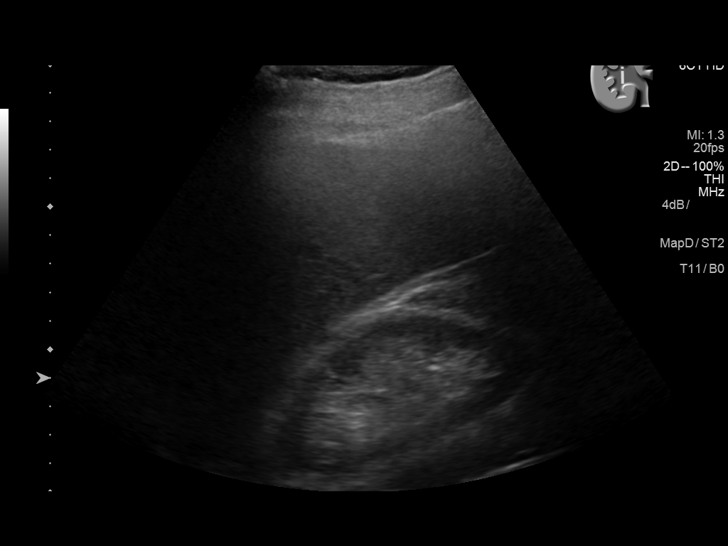
[im 4/38]
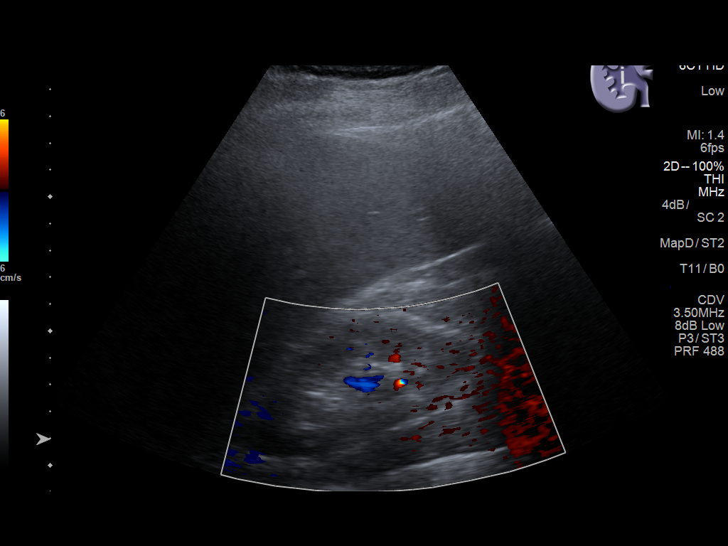
[im 7/38]
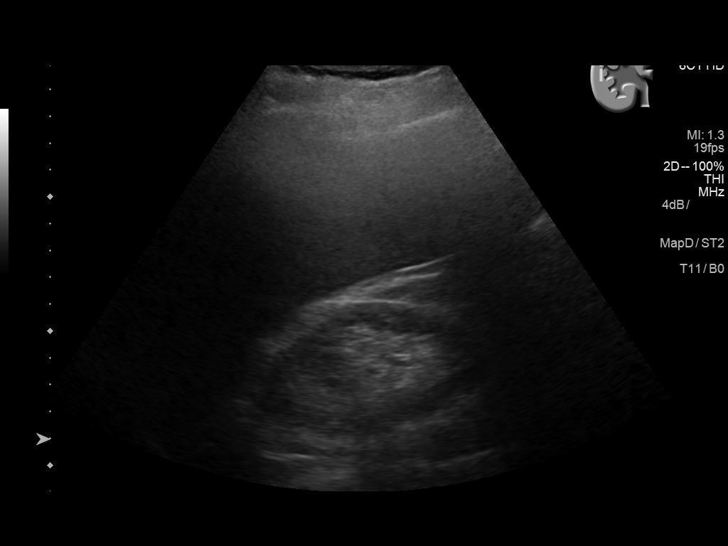
[im 10/38]
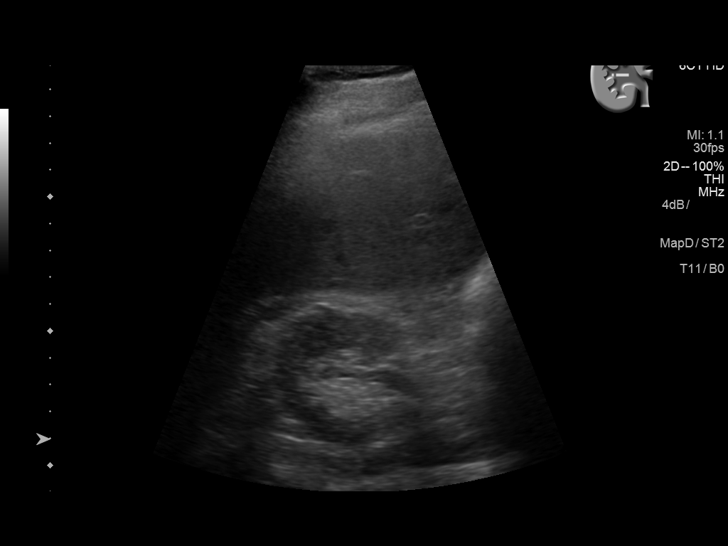
[im 13/38]
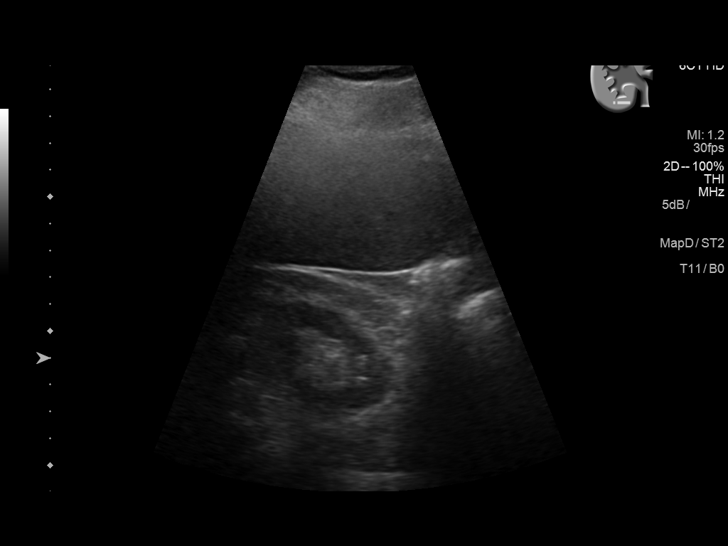
[im 14/38]
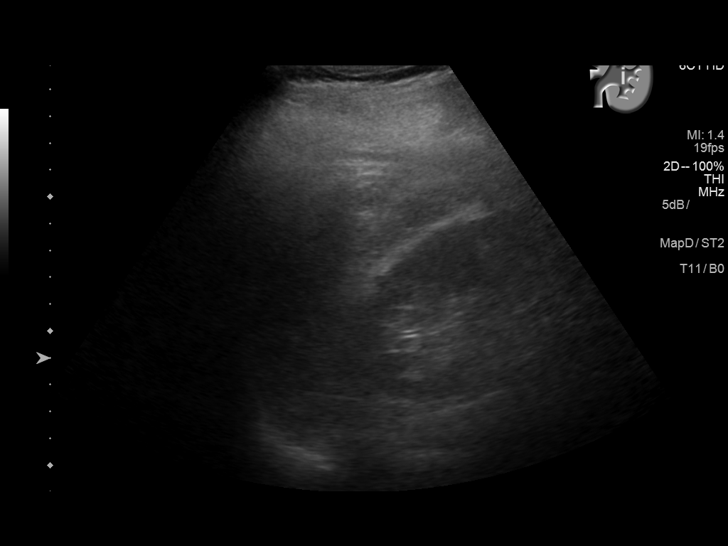
[im 17/38]
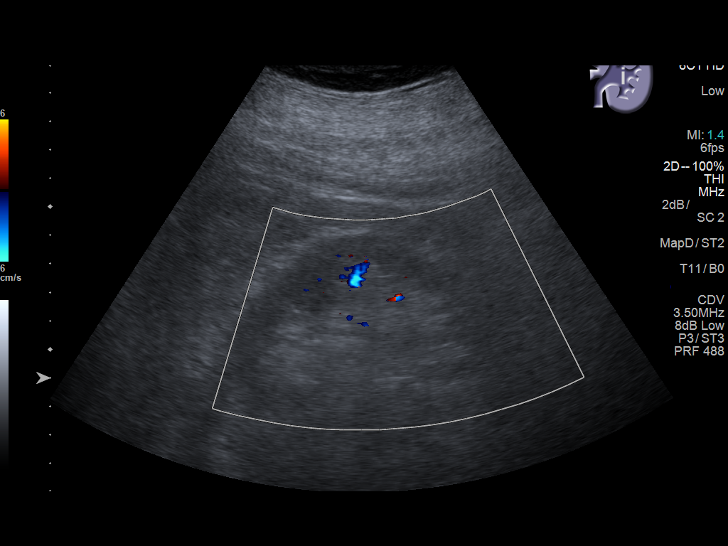
[im 21/38]
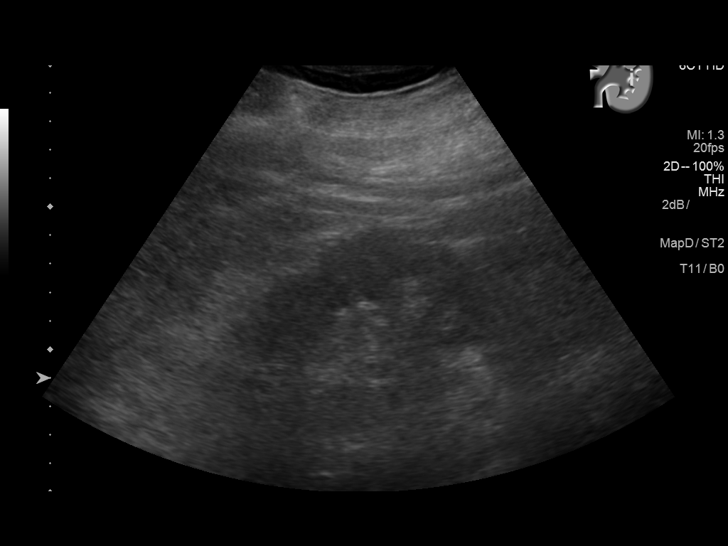
[im 24/38]
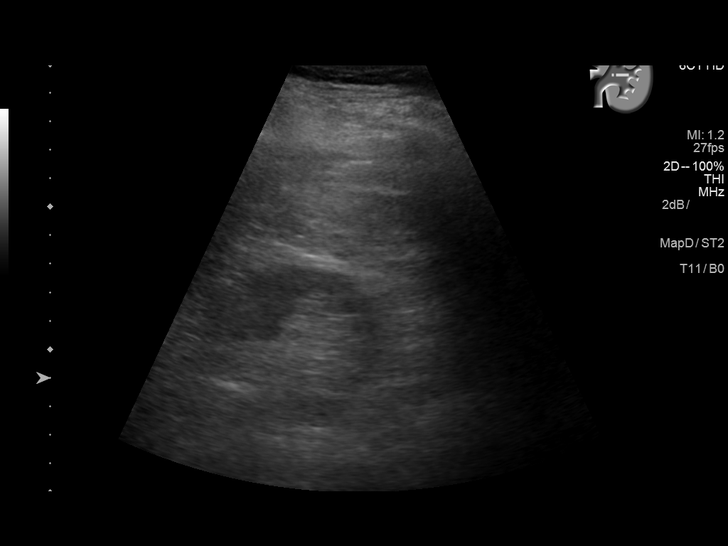
[im 25/38]
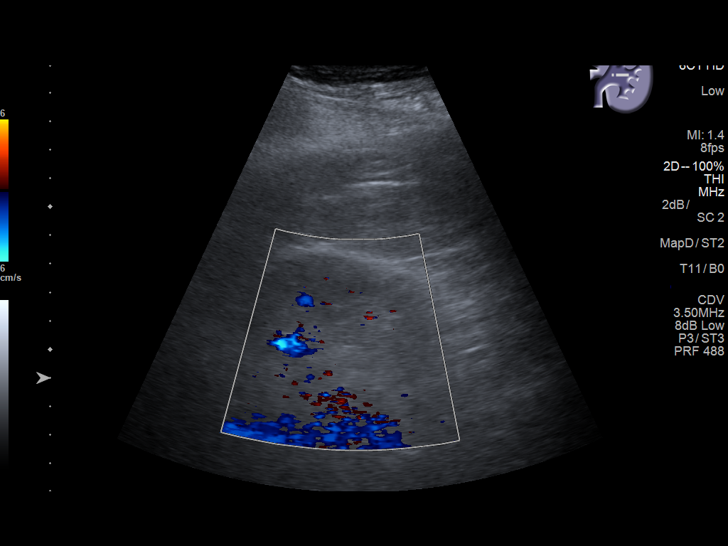
[im 28/38]
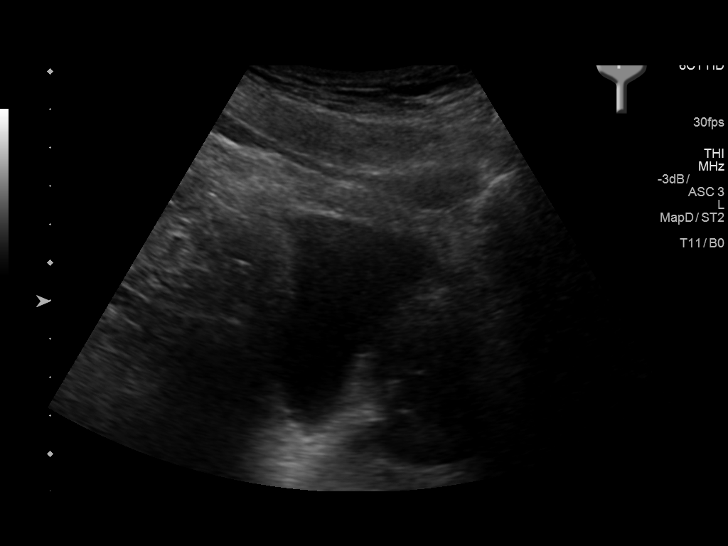
[im 31/38]
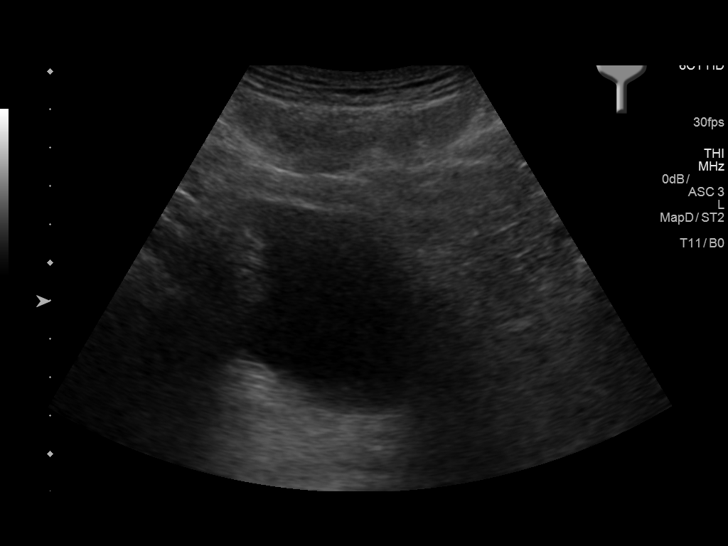
[im 34/38]
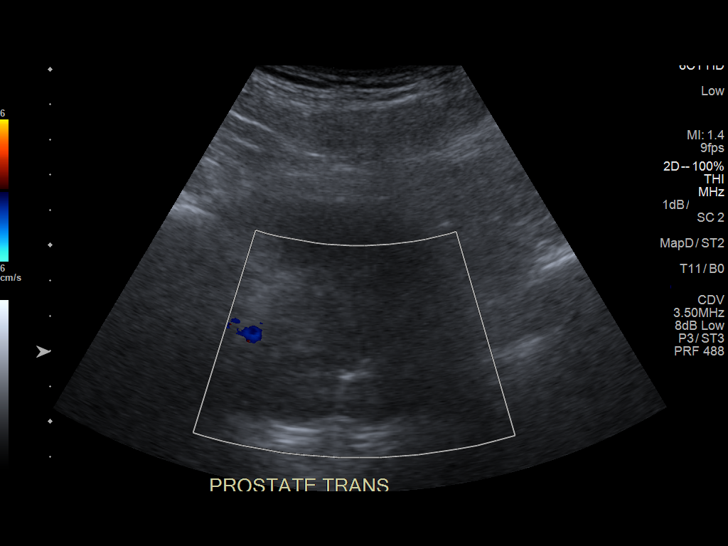
[im 38/38]
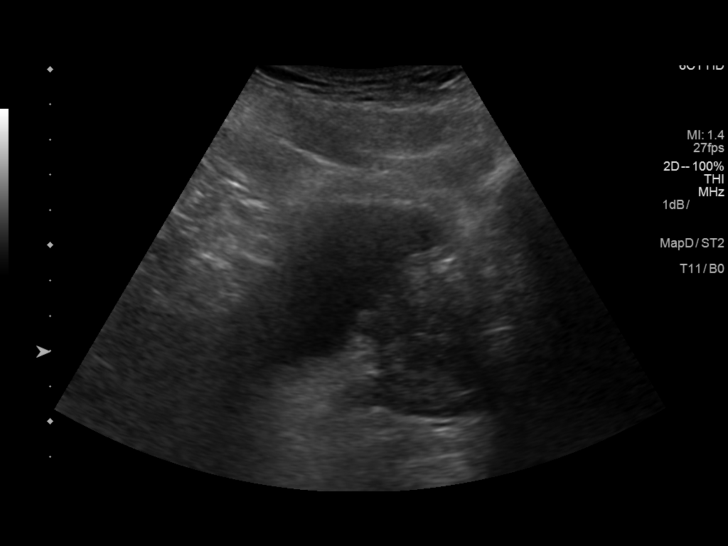

[14 of 25 positions shown; findings below may reference images not displayed]

FINDINGS: Right Kidney:

Length: 9.9 cm. Renal cortical thinning . Echogenicity within normal
limits. No mass or hydronephrosis visualized.

Left Kidney:

Length: 10.4 cm. Renal cortical thinning. Echogenicity within normal
limits. No mass or hydronephrosis visualized. Previously identified
low left staghorn calculus no longer visualized.

Bladder:

Bladder wall measures 5.3 mm. Slight thickening may be related to
nondistention. A process such as cystitis or bladder wall
hypertrophy cannot be excluded. The prostate is enlarged.
IMPRESSION: 1. Previously identified left staghorn calculus no longer
identified. Bilateral renal cortical thinning. No acute renal
abnormality. No hydronephrosis.

2. Bladder is nondistended. Mild bladder wall thickening noted. This
may be from lack of distention however a process such as cystitis or
bladder wall hypertrophy cannot be excluded. The prostate is
enlarged.

## 2017-06-05 IMAGING — NM NM BONE WHOLE BODY
2 series · 8 of 8 positions shown · non-contrast
Comparison: 12/29/2014 plain films of right knee.

CLINICAL DATA: [DATE] mCi UUmDc MDP. Prostate Ca, evaluate for
metastasis. No surgery or radiation. History of closed fracture
right tibial plateau (log fell on leg and "crushed right knee") 3
yrs ago. Surgically repaired. Old finger fracture. Left kidney stone
removed 2 weeks ago. No bone pain. Fractured mandible 1-1/2 months
ago.

EXAM:
NUCLEAR MEDICINE WHOLE BODY BONE SCAN
TECHNIQUE: Whole body anterior and posterior images were obtained approximately
3 hours after intravenous injection of radiopharmaceutical.
RADIOPHARMACEUTICALS:  21.33 mCi 0echnetium-ZZm MDP IV

[Series 1000: statics · 2.40mm/px · 3 acquisitions, 6 frames shown]
[im 1/3]
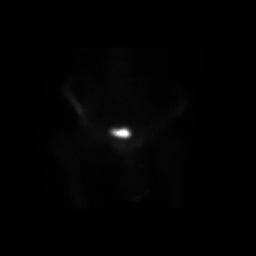
[im 1/3]
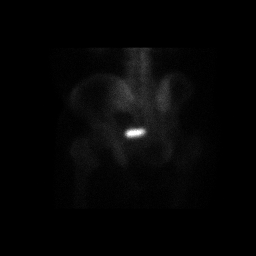
[im 2/3]
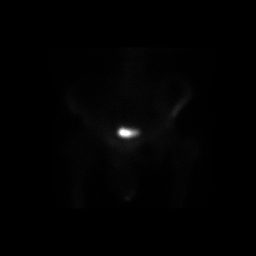
[im 2/3]
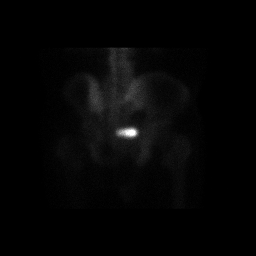
[im 3/3]
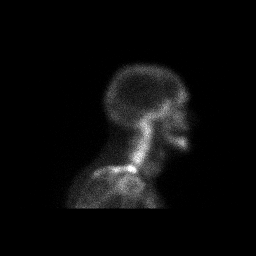
[im 3/3]
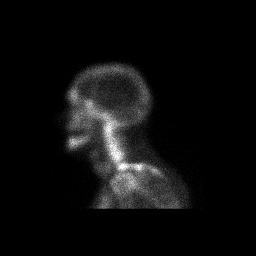

[Series 1000: 3 hr wholebody · 2.40mm/px · 2 of 2 frames shown]
[frame 1/2]
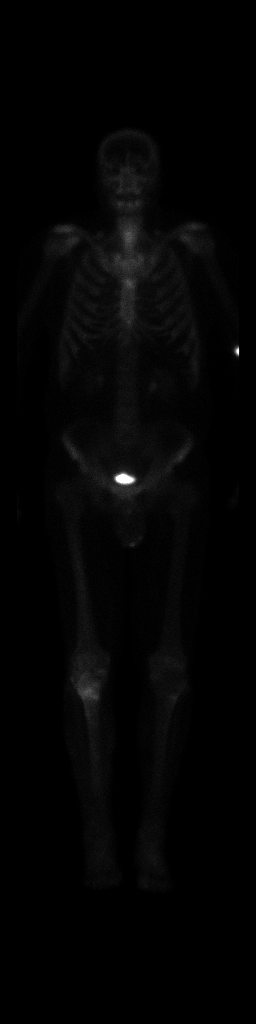
[frame 2/2]
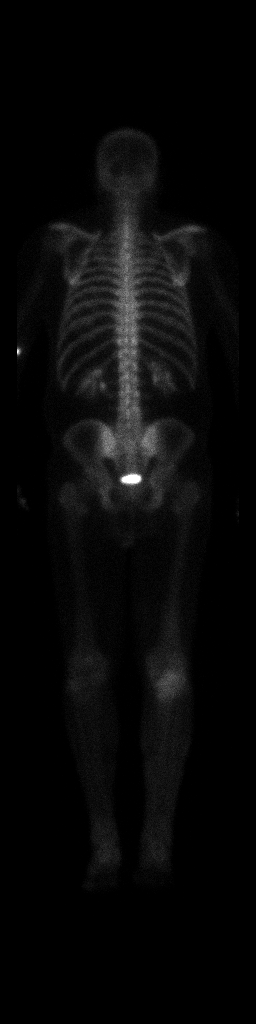

[8 of 8 positions shown; findings below may reference images not displayed]

FINDINGS: There is symmetric bilateral renal activity. Bladder activity is
present. Focal increased osseous remodeling is identified in the
right tibial plateau, consistent with remote fracture and surgical
repair. Focal increased activity is identified along the mandible
consistent with dental disease or recent extractions. Mild
degenerative change identified in the left mean.

No suspicious areas of osseous remodeling are identified to indicate
presence of metastatic disease.
IMPRESSION: No evidence for osseous metastatic disease.

## 2017-06-24 IMAGING — CR DG ABDOMEN 1V
1 series · 2 of 2 positions shown · non-contrast
Comparison: 05/09/2016.

CLINICAL DATA: History of multiple kidney stones.

EXAM:
ABDOMEN - 1 VIEW

[Series 1: dg abd 1 view · 0.14mm/px · 2 of 2 slices shown]
[im 1/2]
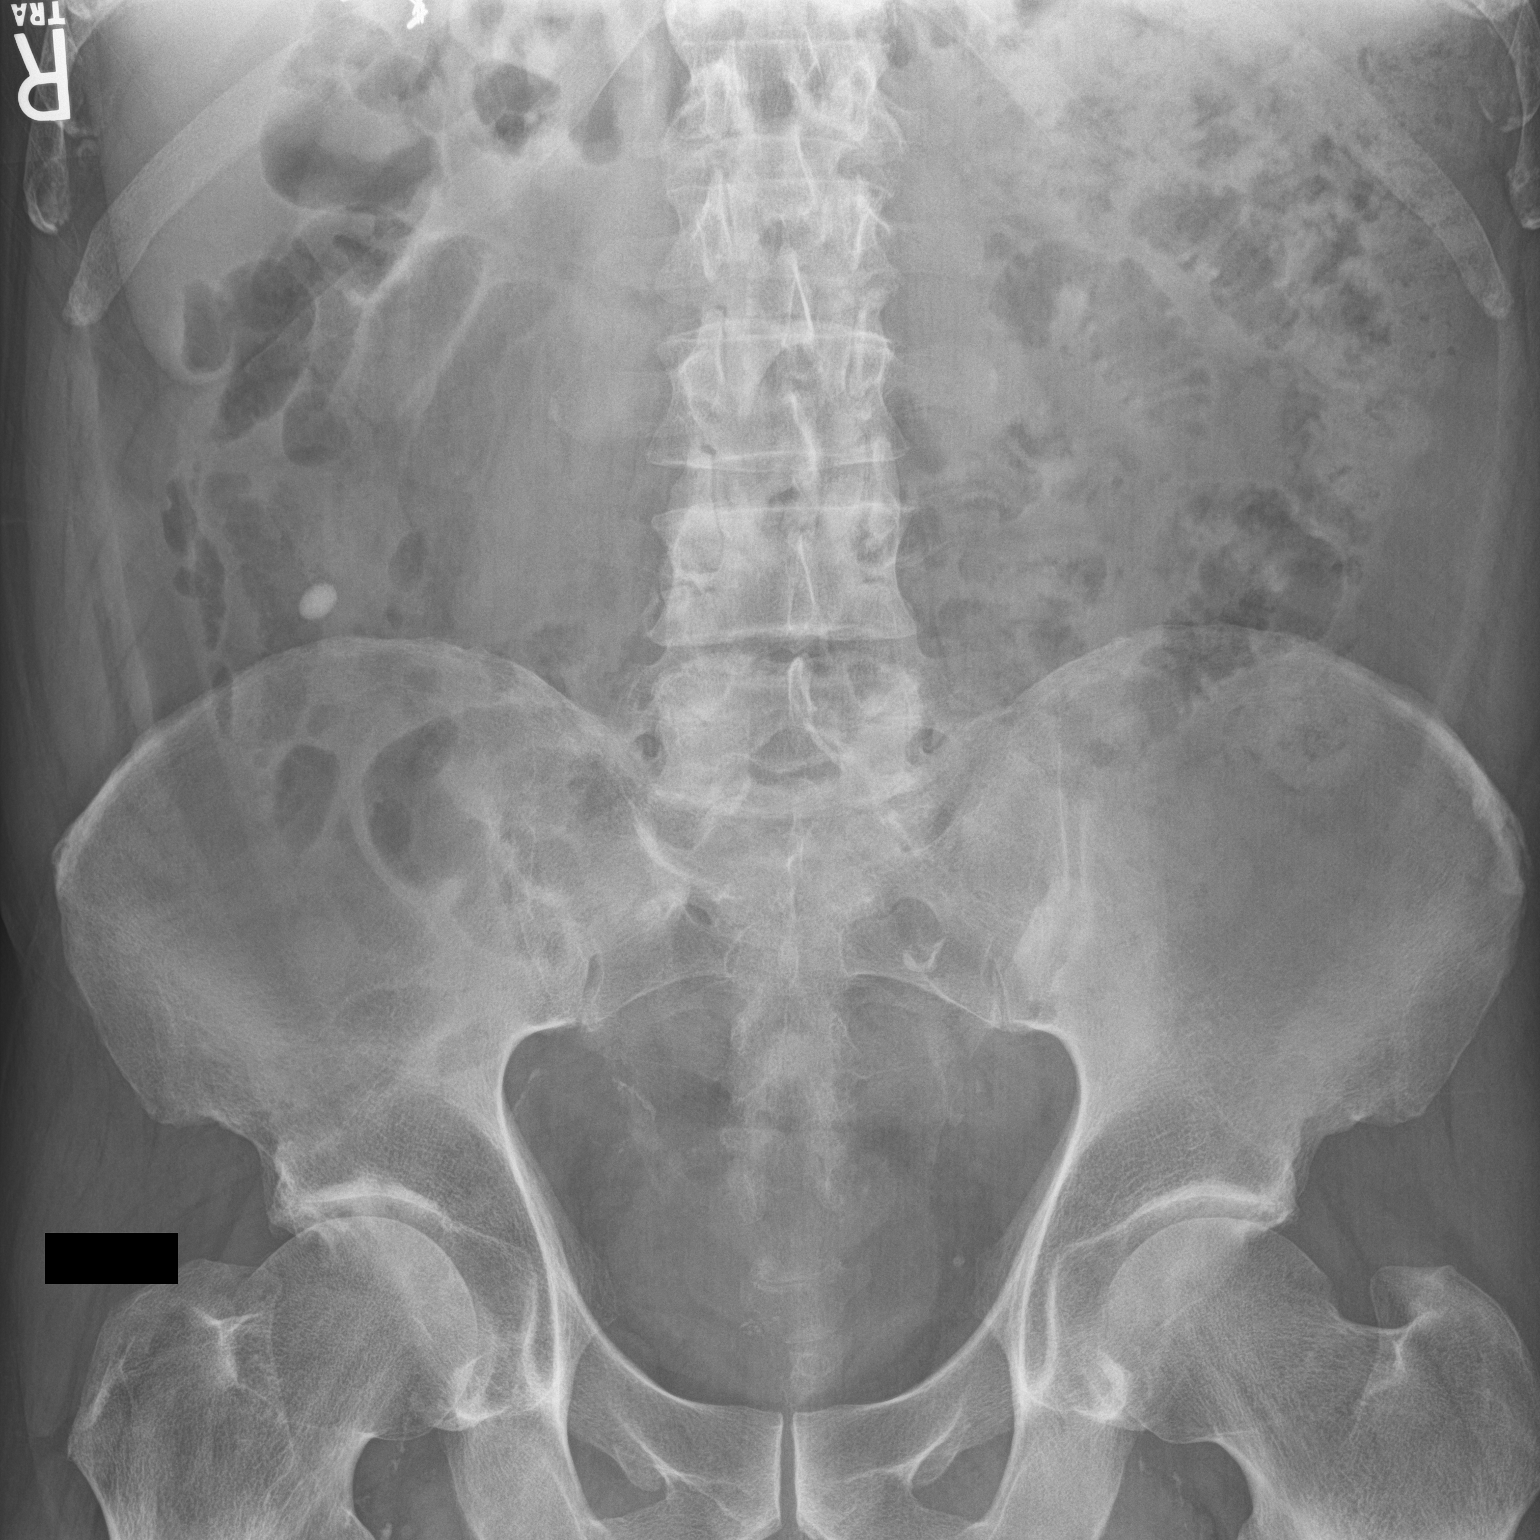
[im 2/2]
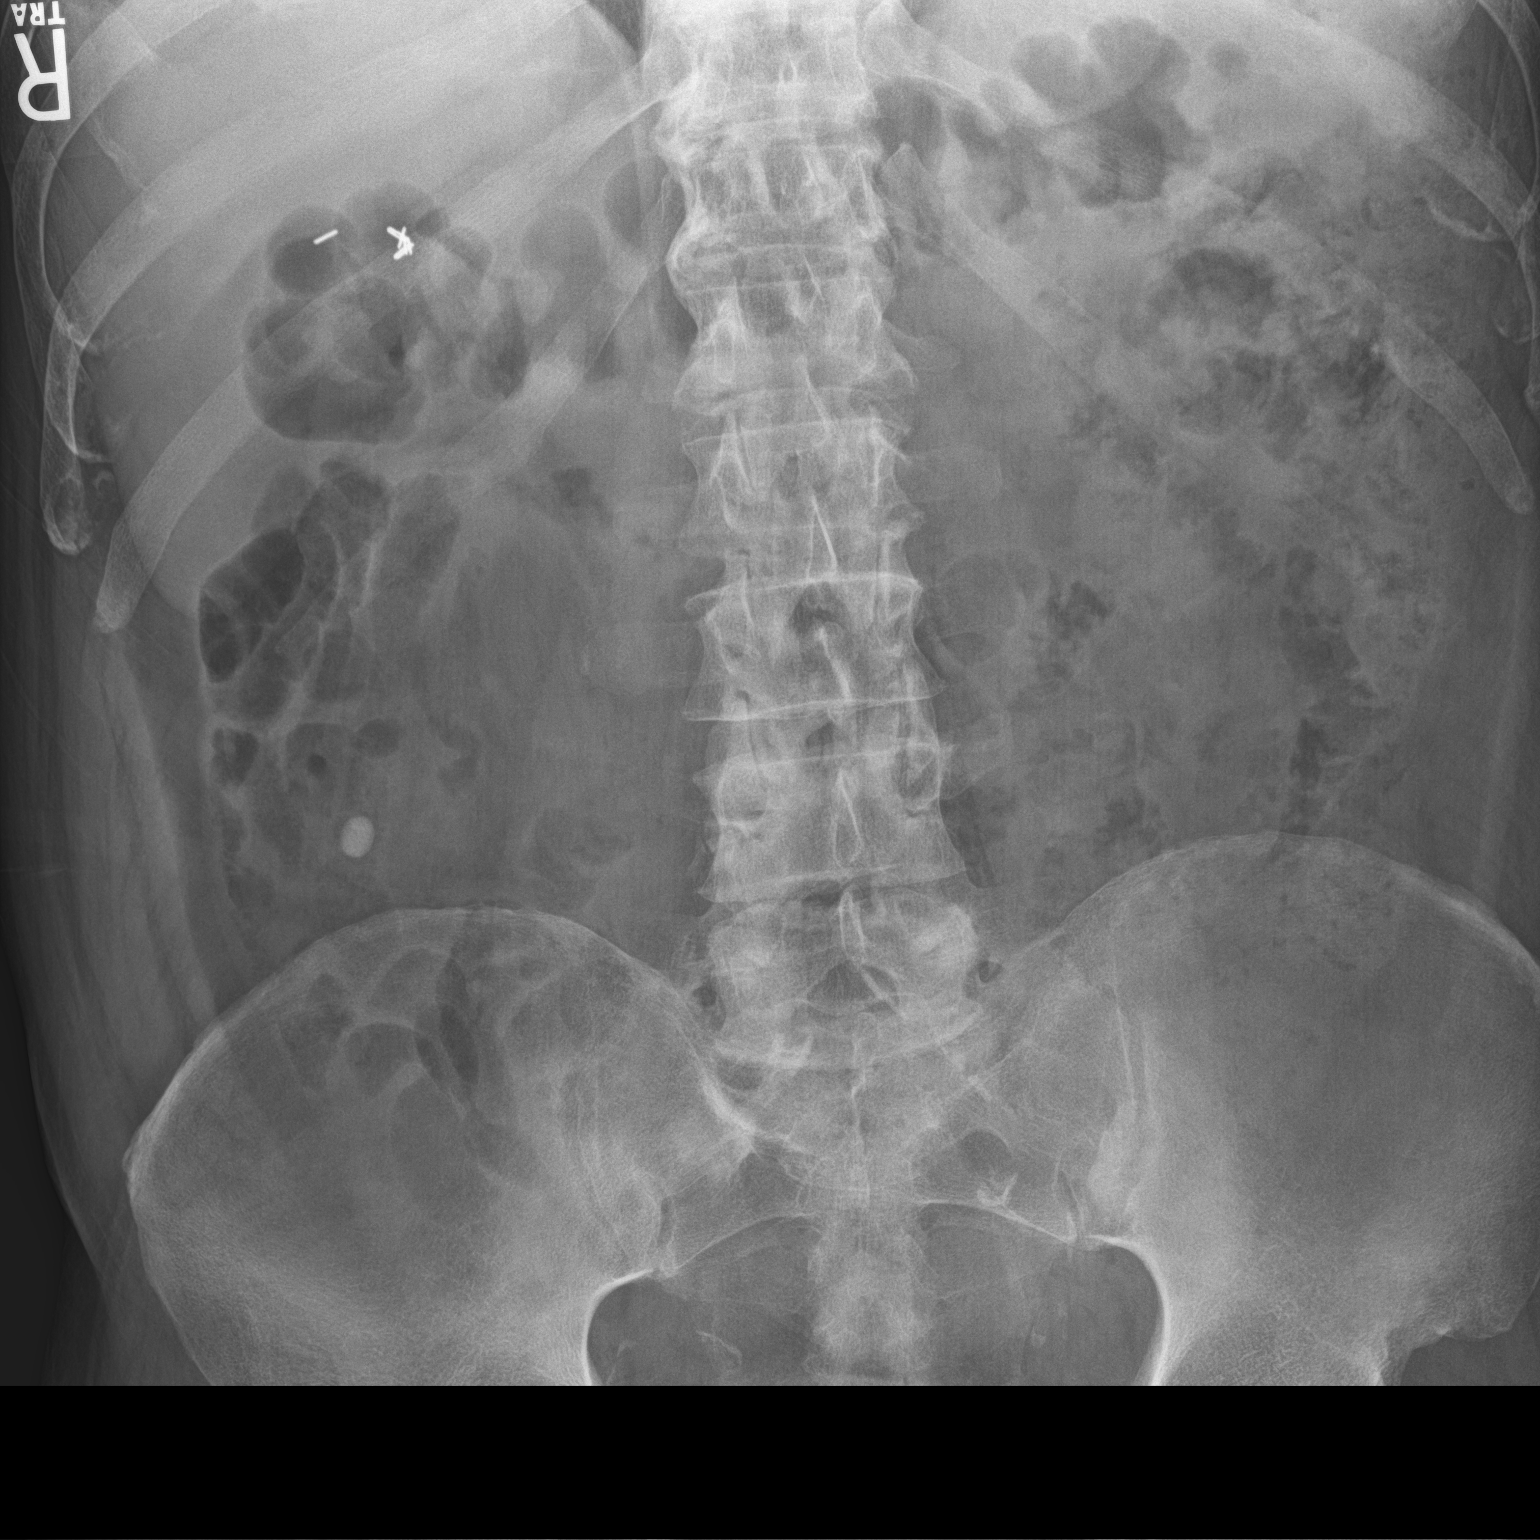

[2 of 2 positions shown; findings below may reference images not displayed]

FINDINGS: The previously noted left renal calculi are no longer visualized.
Right upper quadrant cholecystectomy clips noted. Normal bowel gas
pattern. No dilated loops
IMPRESSION: Previously noted left renal calculi are no longer visualized.

## 2018-03-07 ENCOUNTER — Inpatient Hospital Stay: Payer: Medicare Other | Attending: Radiation Oncology

## 2018-03-07 DIAGNOSIS — C61 Malignant neoplasm of prostate: Secondary | ICD-10-CM | POA: Insufficient documentation

## 2018-03-07 LAB — PSA: PROSTATIC SPECIFIC ANTIGEN: 0.31 ng/mL (ref 0.00–4.00)

## 2018-03-14 ENCOUNTER — Ambulatory Visit
Admission: RE | Admit: 2018-03-14 | Discharge: 2018-03-14 | Disposition: A | Discharge status: Home / Self Care | Payer: Medicare Other | Source: Ambulatory Visit | Attending: Radiation Oncology | Admitting: Radiation Oncology

## 2018-03-14 ENCOUNTER — Encounter: Payer: Self-pay | Admitting: Radiation Oncology

## 2018-03-14 ENCOUNTER — Other Ambulatory Visit: Payer: Self-pay

## 2018-03-14 ENCOUNTER — Other Ambulatory Visit: Payer: Self-pay | Admitting: *Deleted

## 2018-03-14 VITALS — BP 146/97 | HR 86 | Temp 95.7°F | Resp 16 | Wt 186.8 lb

## 2018-03-14 DIAGNOSIS — Z923 Personal history of irradiation: Secondary | ICD-10-CM | POA: Insufficient documentation

## 2018-03-14 DIAGNOSIS — C61 Malignant neoplasm of prostate: Secondary | ICD-10-CM | POA: Diagnosis present

## 2018-03-14 NOTE — Progress Notes (Signed)
Radiation Oncology Follow up Note  Name: Bruce [name]   Date:   03/14/2018 MRN:  [id number] DOB: [birth year]    This 73 y.o. male presents to the clinic today for -1/2 year follow-up status post I-125 interstitial implant for Gleason 7 adenocarcinoma the prostate.  REFERRING PROVIDER: Danelle Berry, NP  HPI: patient is a 73 year old male now out 1-1/2 years having completed I-125 interstitial implant for Gleason 7 adenocarcinoma the prostate. He is seen today in routine follow-up is doing well specifically denies diarrhea dysuria or any other GI/GU complaints..his PSA from 7 days ago was 0.31 down from 0.431 year prior.  COMPLICATIONS OF TREATMENT: none  FOLLOW UP COMPLIANCE: keeps appointments   PHYSICAL EXAM:  BP (!) 146/97 (BP Location: Left Arm, Patient Position: Sitting)   Pulse 86   Temp (!) 95.7 F (35.4 C) (Tympanic)   Resp 16   Wt 186 lb 13.4 oz (84.8 kg)   BMI 24.65 kg/m  Well-developed well-nourished patient in NAD. HEENT reveals PERLA, EOMI, discs not visualized.  Oral cavity is clear. No oral mucosal lesions are identified. Neck is clear without evidence of cervical or supraclavicular adenopathy. Lungs are clear to A&P. Cardiac examination is essentially unremarkable with regular rate and rhythm without murmur rub or thrill. Abdomen is benign with no organomegaly or masses noted. Motor sensory and DTR levels are equal and symmetric in the upper and lower extremities. Cranial nerves II through XII are grossly intact. Proprioception is intact. No peripheral adenopathy or edema is identified. No motor or sensory levels are noted. Crude visual fields are within normal range.  RADIOLOGY RESULTS: no current films for review  PLAN: present time patient is doing well under excellent biochemical control of his prostate cancer. I'm please was overall progress. I've asked to see him back in 1 year for follow-up with a PSA at that time. Patient is to call with any concerns at any  time.  I would like to take this opportunity to thank you for allowing me to participate in the care of your patient.Noreene Filbert, MD

## 2018-04-05 ENCOUNTER — Other Ambulatory Visit: Payer: Self-pay | Admitting: Radiation Oncology

## 2018-04-28 ENCOUNTER — Observation Stay
Admission: EM | Admit: 2018-04-28 | Discharge: 2018-04-30 | Disposition: A | Discharge status: Home / Self Care | Payer: Medicare Other | Attending: Internal Medicine | Admitting: Internal Medicine

## 2018-04-28 ENCOUNTER — Other Ambulatory Visit: Payer: Self-pay

## 2018-04-28 DIAGNOSIS — I739 Peripheral vascular disease, unspecified: Secondary | ICD-10-CM | POA: Diagnosis not present

## 2018-04-28 DIAGNOSIS — I252 Old myocardial infarction: Secondary | ICD-10-CM | POA: Diagnosis not present

## 2018-04-28 DIAGNOSIS — E876 Hypokalemia: Secondary | ICD-10-CM | POA: Diagnosis not present

## 2018-04-28 DIAGNOSIS — I509 Heart failure, unspecified: Secondary | ICD-10-CM | POA: Insufficient documentation

## 2018-04-28 DIAGNOSIS — R55 Syncope and collapse: Secondary | ICD-10-CM | POA: Diagnosis present

## 2018-04-28 DIAGNOSIS — Z888 Allergy status to other drugs, medicaments and biological substances status: Secondary | ICD-10-CM | POA: Diagnosis not present

## 2018-04-28 DIAGNOSIS — Z8249 Family history of ischemic heart disease and other diseases of the circulatory system: Secondary | ICD-10-CM | POA: Insufficient documentation

## 2018-04-28 DIAGNOSIS — F411 Generalized anxiety disorder: Secondary | ICD-10-CM

## 2018-04-28 DIAGNOSIS — Z7901 Long term (current) use of anticoagulants: Secondary | ICD-10-CM | POA: Diagnosis not present

## 2018-04-28 DIAGNOSIS — I110 Hypertensive heart disease with heart failure: Secondary | ICD-10-CM | POA: Insufficient documentation

## 2018-04-28 DIAGNOSIS — F330 Major depressive disorder, recurrent, mild: Secondary | ICD-10-CM

## 2018-04-28 DIAGNOSIS — F17210 Nicotine dependence, cigarettes, uncomplicated: Secondary | ICD-10-CM | POA: Insufficient documentation

## 2018-04-28 DIAGNOSIS — J449 Chronic obstructive pulmonary disease, unspecified: Secondary | ICD-10-CM | POA: Insufficient documentation

## 2018-04-28 DIAGNOSIS — I2510 Atherosclerotic heart disease of native coronary artery without angina pectoris: Secondary | ICD-10-CM | POA: Diagnosis not present

## 2018-04-28 DIAGNOSIS — I10 Essential (primary) hypertension: Secondary | ICD-10-CM | POA: Diagnosis present

## 2018-04-28 DIAGNOSIS — K219 Gastro-esophageal reflux disease without esophagitis: Secondary | ICD-10-CM | POA: Insufficient documentation

## 2018-04-28 DIAGNOSIS — Z79899 Other long term (current) drug therapy: Secondary | ICD-10-CM | POA: Diagnosis not present

## 2018-04-28 DIAGNOSIS — J441 Chronic obstructive pulmonary disease with (acute) exacerbation: Secondary | ICD-10-CM | POA: Diagnosis present

## 2018-04-28 LAB — COMPREHENSIVE METABOLIC PANEL
ALT: 16 U/L (ref 0–44)
AST: 28 U/L (ref 15–41)
Albumin: 4.1 g/dL (ref 3.5–5.0)
Alkaline Phosphatase: 77 U/L (ref 38–126)
Anion gap: 13 (ref 5–15)
BUN: 13 mg/dL (ref 8–23)
CO2: 22 mmol/L (ref 22–32)
Calcium: 9.3 mg/dL (ref 8.9–10.3)
Chloride: 106 mmol/L (ref 98–111)
Creatinine, Ser: 1.71 mg/dL — ABNORMAL HIGH (ref 0.61–1.24)
GFR calc Af Amer: 46 mL/min — ABNORMAL LOW (ref >60)
GFR calc non Af Amer: 40 mL/min — ABNORMAL LOW (ref >60)
Glucose, Bld: 105 mg/dL — ABNORMAL HIGH (ref 70–99)
Potassium: 2.8 mmol/L — ABNORMAL LOW (ref 3.5–5.1)
Sodium: 141 mmol/L (ref 135–145)
Total Bilirubin: 0.6 mg/dL (ref 0.3–1.2)
Total Protein: 6.6 g/dL (ref 6.5–8.1)

## 2018-04-28 LAB — CBC WITH DIFFERENTIAL/PLATELET
ABS IMMATURE GRANULOCYTES: 0.01 10*3/uL (ref 0.00–0.07)
Basophils Absolute: 0 10*3/uL (ref 0.0–0.1)
Basophils Relative: 1 %
Eosinophils Absolute: 0.1 10*3/uL (ref 0.0–0.5)
Eosinophils Relative: 2 %
HCT: 41.9 % (ref 39.0–52.0)
Hemoglobin: 13.9 g/dL (ref 13.0–17.0)
Immature Granulocytes: 0 %
Lymphocytes Relative: 31 %
Lymphs Abs: 1.9 10*3/uL (ref 0.7–4.0)
MCH: 32.7 pg (ref 26.0–34.0)
MCHC: 33.2 g/dL (ref 30.0–36.0)
MCV: 98.6 fL (ref 80.0–100.0)
Monocytes Absolute: 0.4 10*3/uL (ref 0.1–1.0)
Monocytes Relative: 7 %
NEUTROS ABS: 3.6 10*3/uL (ref 1.7–7.7)
Neutrophils Relative %: 59 %
Platelets: 204 10*3/uL (ref 150–400)
RBC: 4.25 MIL/uL (ref 4.22–5.81)
RDW: 12.4 % (ref 11.5–15.5)
WBC: 6 10*3/uL (ref 4.0–10.5)
nRBC: 0 % (ref 0.0–0.2)

## 2018-04-28 LAB — URINALYSIS, COMPLETE (UACMP) WITH MICROSCOPIC
BACTERIA UA: NONE SEEN
Bilirubin Urine: NEGATIVE
GLUCOSE, UA: NEGATIVE mg/dL
Hgb urine dipstick: NEGATIVE
Ketones, ur: NEGATIVE mg/dL
Leukocytes,Ua: NEGATIVE
Nitrite: NEGATIVE
PROTEIN: NEGATIVE mg/dL
Specific Gravity, Urine: 1.005 (ref 1.005–1.030)
pH: 6 (ref 5.0–8.0)

## 2018-04-28 LAB — TROPONIN I: Troponin I: <0.03 ng/mL (ref <0.03)

## 2018-04-28 MED ORDER — RANOLAZINE ER 500 MG PO TB12
1000.0000 mg | ORAL_TABLET | ORAL | Status: DC
  Administered 2018-04-29 – 2018-04-30 (×2): 1000 mg via ORAL
  Filled 2018-04-28 (×2): qty 2

## 2018-04-28 MED ORDER — SODIUM CHLORIDE 0.9 % IV BOLUS
1000.0000 mL | Freq: Once | INTRAVENOUS | Status: AC
  Administered 2018-04-28: 1000 mL via INTRAVENOUS

## 2018-04-28 MED ORDER — PANTOPRAZOLE SODIUM 40 MG PO TBEC
40.0000 mg | DELAYED_RELEASE_TABLET | Freq: Every day | ORAL | Status: DC
  Administered 2018-04-29 – 2018-04-30 (×2): 40 mg via ORAL
  Filled 2018-04-28 (×2): qty 1

## 2018-04-28 MED ORDER — MIRTAZAPINE 15 MG PO TABS
15.0000 mg | ORAL_TABLET | Freq: Every day | ORAL | Status: DC
  Administered 2018-04-28 – 2018-04-29 (×2): 15 mg via ORAL
  Filled 2018-04-28 (×2): qty 1

## 2018-04-28 MED ORDER — POTASSIUM CHLORIDE CRYS ER 20 MEQ PO TBCR
40.0000 meq | EXTENDED_RELEASE_TABLET | ORAL | Status: AC
  Administered 2018-04-28 – 2018-04-29 (×2): 40 meq via ORAL
  Filled 2018-04-28 (×2): qty 2

## 2018-04-28 MED ORDER — ALPRAZOLAM 0.25 MG PO TABS
0.2500 mg | ORAL_TABLET | Freq: Every day | ORAL | Status: DC | PRN
  Administered 2018-04-29: 0.25 mg via ORAL
  Filled 2018-04-28: qty 1

## 2018-04-28 MED ORDER — ONDANSETRON HCL 4 MG/2ML IJ SOLN: 4.0000 mg | Freq: Four times a day (QID) | INTRAMUSCULAR | Status: DC | PRN

## 2018-04-28 MED ORDER — ONDANSETRON HCL 4 MG PO TABS: 4.0000 mg | ORAL_TABLET | Freq: Four times a day (QID) | ORAL | Status: DC | PRN

## 2018-04-28 MED ORDER — ACETAMINOPHEN 650 MG RE SUPP: 650.0000 mg | Freq: Four times a day (QID) | RECTAL | Status: DC | PRN

## 2018-04-28 MED ORDER — ACETAMINOPHEN 325 MG PO TABS
650.0000 mg | ORAL_TABLET | Freq: Four times a day (QID) | ORAL | Status: DC | PRN
  Administered 2018-04-29: 650 mg via ORAL
  Filled 2018-04-28: qty 2

## 2018-04-28 MED ORDER — TAMSULOSIN HCL 0.4 MG PO CAPS
0.4000 mg | ORAL_CAPSULE | Freq: Every day | ORAL | Status: DC
  Administered 2018-04-29: 0.4 mg via ORAL
  Filled 2018-04-28: qty 1

## 2018-04-28 MED ORDER — HEPARIN SODIUM (PORCINE) 5000 UNIT/ML IJ SOLN
5000.0000 [IU] | Freq: Three times a day (TID) | INTRAMUSCULAR | Status: DC
  Administered 2018-04-29 – 2018-04-30 (×4): 5000 [IU] via SUBCUTANEOUS
  Filled 2018-04-28 (×4): qty 1

## 2018-04-28 MED ORDER — ATORVASTATIN CALCIUM 20 MG PO TABS
80.0000 mg | ORAL_TABLET | Freq: Every day | ORAL | Status: DC
  Administered 2018-04-28 – 2018-04-29 (×2): 80 mg via ORAL
  Filled 2018-04-28 (×2): qty 4

## 2018-04-28 NOTE — ED Provider Notes (Signed)
[facility] Emergency Department Provider Note  Time seen: 6:40 PM  I have reviewed the triage vital signs and the nursing notes.   HISTORY  Chief Complaint Loss of Consciousness    HPI Bruce [name] is a 73 y.o. male with a past medical history of CAD, anxiety, angina, COPD, gastric reflux, hypertension, presents to the emergency department for syncope and weakness.  According to the patient they were outside working in the yard, he began feeling weak and lightheaded so he went inside.  Son states he came inside shortly afterwards and found the patient slumped over in the chair apparently passed out.  Patient came back to however approximately 5 to 10 minutes later passed out once again.  Upon arrival to the emergency department patient nearly passed out once again.  Patient denies any headache, chest pain or shortness of breath.  Denies any abdominal pain.  Patient did vomit in triage she is on blood thinners, denies any nausea.  Denies any black or bloody stool.  Patient states he feels weak still.  Blood pressure upon arrival 71/34.   Past Medical History:  Diagnosis Date  . Anginal pain ([place])    Left side if chest ,NTG  relieves chaes apin 12/01/13  . Anxiety   . Arthritis   . Cancer [facility])    colon cancer  . CHF (congestive heart failure) ([place])   . 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 Complication of anesthesia    wake up with a head ache  . COPD (chronic obstructive pulmonary disease) ([place])   . Coronary artery disease   . GERD (gastroesophageal reflux disease)   . Headache    related to sinus congestion  . History of blood transfusion   . History of kidney stones   . Hypertension   . Myocardial infarction [facility])    '09 AND '12  . Peripheral vascular disease ([place])   . Shortness of breath    With exertion .  Marland Kitchen UTI (urinary tract infection)    frequent UTI    Patient Active Problem List   Diagnosis Date Noted  . Malignant neoplasm of prostate ([place]) 07/13/2016  . 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 Visual hallucination 03/18/2015  . Auditory hallucination 03/18/2015  . 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 Tibial plateau fracture 12/29/2014  . Limb ischemia 11/18/2013  . Blunt trauma of lower leg 10/08/2013  . 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 Acute blood loss anemia 10/08/2013  . Anxiety disorder 10/08/2013  . Dyslipidemia 10/08/2013  . GERD (gastroesophageal reflux disease) 10/08/2013  . History of MI (myocardial infarction) 10/08/2013  . Anticoagulated 10/08/2013  . Hypertension   . Tibia/fibula fracture 10/06/2013  . CAD (coronary artery disease) 08/16/2010  . Edema 08/16/2010  . COPD (chronic obstructive pulmonary disease) ([place]) 08/16/2010  . DVT of leg (deep venous thrombosis) ([place]) 08/16/2010  . Leg cramps 08/16/2010    Past Surgical History:  Procedure Laterality Date  . CARDIAC CATHETERIZATION    . CHOLECYSTECTOMY    . EXTERNAL FIXATION LEG Right 10/06/2013   Procedure: CLOSED REDUCTION RIGHT TIBIAL PLATEAU FRACTURE, EXTERNAL FIXATION RIGHT LEG, PLACEMENT OF WOUND VAC;  Surgeon: Rozanna Box, MD;  Location: [place];  Service: Orthopedics;  Laterality: Right;  . FEMORAL-POPLITEAL BYPASS GRAFT Right 10/06/2013   Procedure: RIGHT POPLITEAL-POPLITEAL ARTERY BYPASS GRAFT;  Surgeon: Rosetta Posner, MD;  Location: [place];  Service:  Vascular;  Laterality: Right;  . FRACTURE SURGERY Right 2014  . HARDWARE REMOVAL Right 12/02/2013   Procedure: REMOVAL EXTERNAL FIXATION RIGHT LEG ;  Surgeon: Rozanna Box, MD;  Location: [place];  Service: Orthopedics;  Laterality: Right;  . HERNIA REPAIR Right 1990's  . I&D EXTREMITY Right 10/09/2013   Procedure: IRRIGATION AND DEBRIDEMENT RIGHT LEG, CLOSURE  OF WOUNDS, PLACEMENT OF WOUND VAC ON EACH SIDE OF LEG;  Surgeon: Rozanna Box, MD;  Location: [place];  Service:  Orthopedics;  Laterality: Right;  . IR NEPHROSTOMY PLACEMENT LEFT  06/19/2016  . IVC filter  2009   placed @ [facility]/ Removed in 2010.  . IVC Filter Removed    . ligament leg Left   . NEPHROLITHOTOMY Left 06/19/2016   Procedure: NEPHROLITHOTOMY PERCUTANEOUS;  Surgeon: Hollice Espy, MD;  Location: [facility];  Service: Urology;  Laterality: Left;  . ORIF TIBIA FRACTURE Right 12/29/2014   Procedure: OPEN REDUCTION INTERNAL FIXATION (ORIF) RIGHT TIBIA FRACTURE, RIA VS ICBG;  Surgeon: Altamese Nemaha, MD;  Location: [place];  Service: Orthopedics;  Laterality: Right;  . RADIOACTIVE SEED IMPLANT N/A 09/12/2016   Procedure: RADIOACTIVE SEED IMPLANT/BRACHYTHERAPY IMPLANT;  Surgeon: Hollice Espy, MD;  Location: [facility];  Service: Urology;  Laterality: N/A;    Prior to Admission medications   Medication Sig Start Date End Date Taking? Authorizing Provider  allopurinol (ZYLOPRIM) 100 MG tablet Take 100 mg by mouth at bedtime.    [provider]  ALPRAZolam Duanne Moron) 0.25 MG tablet Take 1 tablet (0.25 mg total) by mouth daily as needed for anxiety. 01/02/17   Ursula Alert, MD  atorvastatin (LIPITOR) 80 MG tablet Take 80 mg by mouth at bedtime.     [provider]  benazepril (LOTENSIN) 20 MG tablet Take 20 mg by mouth daily.  08/08/16   [provider]  CALCIUM CITRATE PO Take 250 mg by mouth at bedtime.     [provider]  Cetirizine HCl (ALL DAY ALLERGY PO) Take 1 tablet by mouth daily.    [provider]  cholecalciferol (VITAMIN D) 1000 units tablet Take 1,000 Units by mouth daily.     [provider]  DOCOSAHEXAENOIC ACID PO Take 1 capsule by mouth daily.     [provider]  docusate sodium (COLACE) 100 MG capsule Take 100 mg by mouth at bedtime.    [provider]  fluticasone (FLONASE) 50 MCG/ACT nasal spray Place 1 spray into both nostrils daily.    [provider]  furosemide (LASIX) 20 MG tablet Take 20 mg by mouth  daily.    [provider]  gabapentin (NEURONTIN) 300 MG capsule  01/15/17   [provider]  loperamide (IMODIUM) 2 MG capsule Take by mouth as needed for diarrhea or loose stools.    [provider]  metoprolol succinate (TOPROL-XL) 50 MG 24 hr tablet Take 1 tablet (50 mg total) by mouth daily. Patient taking differently: Take 50 mg by mouth at bedtime.  04/12/11   Jackolyn Confer, MD  mirtazapine (REMERON) 15 MG tablet Take 1 tablet (15 mg total) by mouth at bedtime. 02/05/17   Ursula Alert, MD  montelukast (SINGULAIR) 10 MG tablet  10/05/16   [provider]  Multiple Vitamin (MULTIVITAMIN) tablet Take 1 tablet by mouth daily. For Men    [provider]  nitroGLYCERIN (NITROSTAT) 0.4 MG SL tablet Place 0.4 mg under the tongue every 5 (five) minutes as needed for chest pain.  [provider]  Omega-3 Fatty Acids (FISH OIL PO) Take 1,000 mg by mouth daily.     [provider]  omeprazole (PRILOSEC) 40 MG capsule Take 40 mg by mouth daily.    [provider]  potassium chloride (K-DUR) 10 MEQ tablet Take 10 mEq by mouth daily.  08/08/16   [provider]  Probiotic Product (PROBIOTIC FORMULA PO) Take 1 capsule by mouth at bedtime.     [provider]  ranolazine (RANEXA) 1000 MG SR tablet Take 1,000 mg by mouth every morning.     [provider]  rivaroxaban (XARELTO) 20 MG TABS tablet Take 20 mg by mouth daily with supper.    [provider]  tamsulosin (FLOMAX) 0.4 MG CAPS capsule TAKE ONE CAPSULE BY MOUTH ONCE DAILY AFTER SUPPER 04/05/18   Noreene Filbert, MD  traZODone (DESYREL) 150 MG tablet Take 1 tablet (150 mg total) by mouth at bedtime. 02/05/17   Ursula Alert, MD    Allergies  Allergen Reactions  . Adhesive [Tape] Other (See Comments)    After right leg fracture surgery, pt developed a large blister where tape was applied to his right leg. OK to use paper tape.  . Imdur  [Isosorbide Dinitrate] Other (See Comments)    hallucinations  . Singulair [Montelukast Sodium] Other (See Comments)    Hallucinations     Family History  Problem Relation Age of Onset  . Hypertension Mother   . Prostate cancer Brother   . Mental illness Neg Hx     Social History Social History   Tobacco Use  . Smoking status: Current Some Day Smoker    Packs/day: 0.50    Years: 52.00    Pack years: 26.00  . Smokeless tobacco: Never Used  Substance Use Topics  . Alcohol use: No    Alcohol/week: 0.0 standard drinks    Comment: Stopped 2009  . Drug use: No    Review of Systems Constitutional: Negative for fever. Cardiovascular: Negative for chest pain. Respiratory: Negative for shortness of breath. Gastrointestinal: Negative for abdominal pain.  One episode of vomiting in the emergency department. Genitourinary: Negative for urinary compaints Musculoskeletal: Negative for musculoskeletal complaints Skin: Negative for skin complaints  Neurological: Negative for headache All other ROS negative  ____________________________________________   PHYSICAL EXAM:  VITAL SIGNS: ED Triage Vitals [04/28/18 1819]  Enc Vitals Group     BP (!) 71/34     Pulse Rate (!) 106     Resp 18     Temp      Temp src      SpO2 98 %     Weight 196 lb (88.9 kg)     Height 6\' 1"  (1.854 m)     Head Circumference      Peak Flow      Pain Score 0     Pain Loc      Pain Edu?      Excl. in [place]?    Constitutional: Patient is awake alert and oriented, pale in appearance. Eyes: Normal exam ENT   Head: Normocephalic and atraumatic.   Mouth/Throat: Mucous membranes are moist. Cardiovascular: Normal rate, regular rhythm.  Respiratory: Normal respiratory effort without tachypnea nor retractions. Breath sounds are clear  Gastrointestinal: Soft and nontender. No distention.   Musculoskeletal: Nontender with normal range of motion in all extremities. Neurologic:  Normal speech and  language. No gross focal neurologic deficits  Skin:  Skin is warm, dry.  Pale in appearance. Psychiatric: Mood  and affect are normal. Speech and behavior are normal.   ____________________________________________    EKG  EKG viewed and interpreted by myself shows a sinus tachycardia 110 bpm with a narrow QRS, normal axis, normal intervals.  Nonspecific ST changes.  ____________________________________________   INITIAL IMPRESSION / ASSESSMENT AND PLAN / ED COURSE  Pertinent labs & imaging results that were available during my care of the patient were reviewed by me and considered in my medical decision making (see chart for details).  Patient presents to the emergency department after multiple syncopal episodes, found to be hypotensive 71/34 upon arrival.  Differential is quite broad but would include metabolic abnormality, dehydration, hypovolemia, anemia, GI bleed, ACS.  We will check labs, EKG, begin IV hydration.  Patient's blood pressure has improved with 2 L of fluid currently 134/76.  Patient feels much better.  However given the patient's nonspecific EKG changes, and significant hypotension upon arrival I do believe the patient would warrant admission for continued telemetry monitoring.  Patient agreeable to plan of care.  ____________________________________________   FINAL CLINICAL IMPRESSION(S) / ED DIAGNOSES  Hypotension Syncope Weakness   Harvest Dark, MD 04/28/18 2100

## 2018-04-28 NOTE — H&P (Signed)
[place] [facility] [place] NAME: Bruce [name]    MR#:  [id number]  DATE OF BIRTH:  1946-03-06  DATE OF ADMISSION:  04/28/2018  PRIMARY CARE PHYSICIAN: Danelle Berry, NP   REQUESTING/REFERRING PHYSICIAN: Kerman Passey, MD  CHIEF COMPLAINT:   Chief Complaint  Patient presents with  . Loss of Consciousness    HISTORY OF PRESENT ILLNESS:  Bruce [name]  is a 73 y.o. male who presents with chief complaint as above.  Patient presents to the ED after 2 episodes of syncope.  He states he does not remember the episode, though he does remember feeling weak and lightheaded just prior.  Evaluation here in the ED is largely within normal limits except that he was initially hypotensive, though blood pressure was corrected with IV fluid administration.  Hospitalist were called for admission and further evaluation  PAST MEDICAL HISTORY:   Past Medical History:  Diagnosis Date  . Anginal pain ([place])    Left side if chest ,NTG  relieves chaes apin 12/01/13  . Anxiety   . Arthritis   . Cancer [facility])    colon cancer  . CHF (congestive heart failure) ([place])   . 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 Complication of anesthesia    wake up with a head ache  . COPD (chronic obstructive pulmonary disease) ([place])   . Coronary artery disease   . GERD (gastroesophageal reflux disease)   . Headache    related to sinus congestion  . History of blood transfusion   . History of kidney stones   . Hypertension   . Myocardial infarction [facility])    '09 AND '12  . Peripheral vascular disease ([place])   . Shortness of breath    With exertion .  Marland Kitchen UTI (urinary tract infection)    frequent UTI     PAST SURGICAL HISTORY:   Past Surgical History:  Procedure Laterality Date  . CARDIAC CATHETERIZATION    . CHOLECYSTECTOMY    . EXTERNAL FIXATION LEG Right 10/06/2013   Procedure: CLOSED REDUCTION RIGHT TIBIAL PLATEAU  FRACTURE, EXTERNAL FIXATION RIGHT LEG, PLACEMENT OF WOUND VAC;  Surgeon: Rozanna Box, MD;  Location: [place];  Service: Orthopedics;  Laterality: Right;  . FEMORAL-POPLITEAL BYPASS GRAFT Right 10/06/2013   Procedure: RIGHT POPLITEAL-POPLITEAL ARTERY BYPASS GRAFT;  Surgeon: Rosetta Posner, MD;  Location: [place];  Service: Vascular;  Laterality: Right;  . FRACTURE SURGERY Right 2014  . HARDWARE REMOVAL Right 12/02/2013   Procedure: REMOVAL EXTERNAL FIXATION RIGHT LEG ;  Surgeon: Rozanna Box, MD;  Location: [place];  Service: Orthopedics;  Laterality: Right;  . HERNIA REPAIR Right 1990's  . I&D EXTREMITY Right 10/09/2013   Procedure: IRRIGATION AND DEBRIDEMENT RIGHT LEG, CLOSURE  OF WOUNDS, PLACEMENT OF WOUND VAC ON EACH SIDE OF LEG;  Surgeon: Rozanna Box, MD;  Location: [place];  Service: Orthopedics;  Laterality: Right;  . IR NEPHROSTOMY PLACEMENT LEFT  06/19/2016  . IVC filter  2009   placed @ [facility]/ Removed in 2010.  . IVC Filter Removed    . ligament leg Left   . NEPHROLITHOTOMY Left 06/19/2016   Procedure: NEPHROLITHOTOMY PERCUTANEOUS;  Surgeon: Hollice Espy, MD;  Location: [facility];  Service: Urology;  Laterality: Left;  . ORIF TIBIA FRACTURE Right 12/29/2014   Procedure: OPEN REDUCTION INTERNAL FIXATION (ORIF) RIGHT TIBIA FRACTURE, RIA VS ICBG;  Surgeon: Altamese Willcox, MD;  Location: [place];  Service: Orthopedics;  Laterality: Right;  . RADIOACTIVE SEED IMPLANT N/A 09/12/2016   Procedure: RADIOACTIVE SEED IMPLANT/BRACHYTHERAPY IMPLANT;  Surgeon: Hollice Espy, MD;  Location: [facility];  Service: Urology;  Laterality: N/A;     SOCIAL HISTORY:   Social History   Tobacco Use  . Smoking status: Current Some Day Smoker    Packs/day: 0.50    Years: 52.00    Pack years: 26.00  . Smokeless tobacco: Never Used  Substance Use Topics  . Alcohol use: No    Alcohol/week: 0.0 standard drinks    Comment: Stopped 2009     FAMILY HISTORY:   Family History  Problem Relation Age of Onset  .  Hypertension Mother   . Prostate cancer Brother   . Mental illness Neg Hx      DRUG ALLERGIES:   Allergies  Allergen Reactions  . Adhesive [Tape] Other (See Comments)    After right leg fracture surgery, pt developed a large blister where tape was applied to his right leg. OK to use paper tape.  . Imdur [Isosorbide Dinitrate] Other (See Comments)    hallucinations  . Singulair [Montelukast Sodium] Other (See Comments)    Hallucinations     MEDICATIONS AT HOME:   Prior to Admission medications   Medication Sig Start Date End Date Taking? Authorizing Provider  allopurinol (ZYLOPRIM) 100 MG tablet Take 100 mg by mouth at bedtime.    [provider]  ALPRAZolam Duanne Moron) 0.25 MG tablet Take 1 tablet (0.25 mg total) by mouth daily as needed for anxiety. 01/02/17   Ursula Alert, MD  atorvastatin (LIPITOR) 80 MG tablet Take 80 mg by mouth at bedtime.     [provider]  benazepril (LOTENSIN) 20 MG tablet Take 20 mg by mouth daily.  08/08/16   [provider]  CALCIUM CITRATE PO Take 250 mg by mouth at bedtime.     [provider]  Cetirizine HCl (ALL DAY ALLERGY PO) Take 1 tablet by mouth daily.    [provider]  cholecalciferol (VITAMIN D) 1000 units tablet Take 1,000 Units by mouth daily.     [provider]  DOCOSAHEXAENOIC ACID PO Take 1 capsule by mouth daily.     [provider]  docusate sodium (COLACE) 100 MG capsule Take 100 mg by mouth at bedtime.    [provider]  fluticasone (FLONASE) 50 MCG/ACT nasal spray Place 1 spray into both nostrils daily.    [provider]  furosemide (LASIX) 20 MG tablet Take 20 mg by mouth daily.    [provider]  gabapentin (NEURONTIN) 300 MG capsule  01/15/17   [provider]  loperamide (IMODIUM) 2 MG capsule Take by mouth as needed for diarrhea or loose stools.    [provider]  metoprolol succinate (TOPROL-XL) 50 MG 24 hr  tablet Take 1 tablet (50 mg total) by mouth daily. Patient taking differently: Take 50 mg by mouth at bedtime.  04/12/11   Jackolyn Confer, MD  mirtazapine (REMERON) 15 MG tablet Take 1 tablet (15 mg total) by mouth at bedtime. 02/05/17   Ursula Alert, MD  montelukast (SINGULAIR) 10 MG tablet  10/05/16   [provider]  Multiple Vitamin (MULTIVITAMIN) tablet Take 1 tablet by mouth daily. For Men    [provider]  nitroGLYCERIN (NITROSTAT) 0.4 MG SL tablet Place 0.4 mg under the tongue every 5 (five) minutes as needed for chest pain.     [provider]  Omega-3  Fatty Acids (FISH OIL PO) Take 1,000 mg by mouth daily.     [provider]  omeprazole (PRILOSEC) 40 MG capsule Take 40 mg by mouth daily.    [provider]  potassium chloride (K-DUR) 10 MEQ tablet Take 10 mEq by mouth daily.  08/08/16   [provider]  Probiotic Product (PROBIOTIC FORMULA PO) Take 1 capsule by mouth at bedtime.     [provider]  ranolazine (RANEXA) 1000 MG SR tablet Take 1,000 mg by mouth every morning.     [provider]  rivaroxaban (XARELTO) 20 MG TABS tablet Take 20 mg by mouth daily with supper.    [provider]  tamsulosin (FLOMAX) 0.4 MG CAPS capsule TAKE ONE CAPSULE BY MOUTH ONCE DAILY AFTER SUPPER 04/05/18   Noreene Filbert, MD  traZODone (DESYREL) 150 MG tablet Take 1 tablet (150 mg total) by mouth at bedtime. 02/05/17   Ursula Alert, MD    REVIEW OF SYSTEMS:  Review of Systems  Constitutional: Negative for chills, fever, malaise/fatigue and weight loss.  HENT: Negative for ear pain, hearing loss and tinnitus.   Eyes: Negative for blurred vision, double vision, pain and redness.  Respiratory: Negative for cough, hemoptysis and shortness of breath.   Cardiovascular: Negative for chest pain, palpitations, orthopnea and leg swelling.  Gastrointestinal: Negative for abdominal pain, constipation, diarrhea, nausea and  vomiting.  Genitourinary: Negative for dysuria, frequency and hematuria.  Musculoskeletal: Negative for back pain, joint pain and neck pain.  Skin:       No acne, rash, or lesions  Neurological: Positive for loss of consciousness. Negative for dizziness, tremors, focal weakness and weakness.  Endo/Heme/Allergies: Negative for polydipsia. Does not bruise/bleed easily.  Psychiatric/Behavioral: Negative for depression. The patient is not nervous/anxious and does not have insomnia.      VITAL SIGNS:   Vitals:   04/28/18 2000 04/28/18 2030 04/28/18 2034 04/28/18 2100  BP: 131/70 134/76 134/76 130/73  Pulse: 79 75 82 80  Resp: 17 15 20 18   SpO2: 98% 97% 97% 96%  Weight:      Height:       Wt Readings from Last 3 Encounters:  04/28/18 88.9 kg  03/14/18 84.8 kg  03/08/17 93.3 kg    PHYSICAL EXAMINATION:  Physical Exam  Vitals reviewed. Constitutional: He is oriented to person, place, and time. He appears well-developed and well-nourished. No distress.  HENT:  Head: Normocephalic and atraumatic.  Mouth/Throat: Oropharynx is clear and moist.  Eyes: Pupils are equal, round, and reactive to light. Conjunctivae and EOM are normal. No scleral icterus.  Neck: Normal range of motion. Neck supple. No JVD present. No thyromegaly present.  Cardiovascular: Normal rate, regular rhythm and intact distal pulses. Exam reveals no gallop and no friction rub.  No murmur heard. Respiratory: Effort normal and breath sounds normal. No respiratory distress. He has no wheezes. He has no rales.  GI: Soft. Bowel sounds are normal. He exhibits no distension. There is no abdominal tenderness.  Musculoskeletal: Normal range of motion.        General: No edema.     Comments: No arthritis, no gout  Lymphadenopathy:    He has no cervical adenopathy.  Neurological: He is alert and oriented to person, place, and time. No cranial nerve deficit.  No dysarthria, no aphasia  Skin: Skin is warm and dry. No rash  noted. No erythema.  Psychiatric: He has a normal mood and affect. His behavior is normal. Judgment and thought content  normal.    LABORATORY PANEL:   CBC Recent Labs  Lab 04/28/18 1823  WBC 6.0  HGB 13.9  HCT 41.9  PLT 204   ------------------------------------------------------------------------------------------------------------------  Chemistries  Recent Labs  Lab 04/28/18 1823  NA 141  K 2.8*  CL 106  CO2 22  GLUCOSE 105*  BUN 13  CREATININE 1.71*  CALCIUM 9.3  AST 28  ALT 16  ALKPHOS 77  BILITOT 0.6   ------------------------------------------------------------------------------------------------------------------  Cardiac Enzymes Recent Labs  Lab 04/28/18 1823  TROPONINI <0.03   ------------------------------------------------------------------------------------------------------------------  RADIOLOGY:  No results found.  EKG:   Orders placed or performed during the hospital encounter of 04/28/18  . ED EKG  . ED EKG    IMPRESSION AND PLAN:  Principal Problem:   Recurrent syncope -unclear etiology.  Work-up in the ED was largely within normal limits except for hypotension on arrival which corrected with IV fluids.  We will get an echocardiogram and a cardiology consult Active Problems:   CAD (coronary artery disease) -continue home meds   COPD (chronic obstructive pulmonary disease) ([place]) -continue home dose inhalers   Hypertension -continue home medications   GERD (gastroesophageal reflux disease) -home dose PPI  Chart review performed and case discussed with ED provider. Labs, imaging and/or ECG reviewed by provider and discussed with patient/family. Management plans discussed with the patient and/or family.  DVT PROPHYLAXIS: SubQ heparin  GI PROPHYLAXIS:  PPI   ADMISSION STATUS: Observation  CODE STATUS: Full Code Status History    Date Active Date Inactive Code Status Order ID Comments User Context   06/19/2016 1643 06/20/2016 2153  Full Code [id number]  Hollice Espy, MD Inpatient   12/29/2014 2357 01/01/2015 1731 Full Code [id number]  Danne Baxter Inpatient   10/06/2013 1929 10/11/2013 1800 Full Code [id number]  Lisette Abu, PA-C Inpatient      TOTAL TIME TAKING CARE OF THIS PATIENT: 40 minutes.   Ethlyn Daniels 04/28/2018, 10:10 PM  [facility]  Office  [phone]  CC: Primary care physician; Danelle Berry, NP  Note:  This document was prepared using Dragon voice recognition software and may include unintentional dictation errors.

## 2018-04-28 NOTE — ED Notes (Signed)
Sandwich tray and beverage given to pt.

## 2018-04-28 NOTE — ED Notes (Signed)
RN Karena Addison informed of pt status and that pt is roomed. EDT [place]ott at bedside hooking pt up to monitor.

## 2018-04-28 NOTE — ED Notes (Signed)
Pt vomiting in triage. Pt vomited blood at this time.

## 2018-04-28 NOTE — ED Triage Notes (Signed)
Pt comes via POV from home with multiple LOC episodes. Pt states this started about 30 minutes ago.   Pt states he hasn't been sleeping and decreased oral intake.  Son states pt was sitting down and was having many episodes of passing out.   Pt appears pale. Pt states weakness. Pt denies any pain.  Pt is on blood thinners.

## 2018-04-28 NOTE — ED Notes (Signed)
Full rainbow and type and screen sent to lab 

## 2018-04-28 NOTE — ED Notes (Signed)
ED TO INPATIENT HANDOFF REPORT  ED Nurse Name and Phone #: Karena Addison [phone]  S Name/Age/Gender Bruce [name] 73 y.o. male Room/Bed: ED07A/ED07A  Code Status   Code Status: Prior  Home/SNF/Other Home Patient oriented to: self, place, time and situation Is this baseline? Yes   Triage Complete: Triage complete  Chief Complaint syncope  Triage Note Pt comes via POV from home with multiple LOC episodes. Pt states this started about 30 minutes ago.   Pt states he hasn't been sleeping and decreased oral intake.  Son states pt was sitting down and was having many episodes of passing out.   Pt appears pale. Pt states weakness. Pt denies any pain.  Pt is on blood thinners.   Allergies Allergies  Allergen Reactions  . Adhesive [Tape] Other (See Comments)    After right leg fracture surgery, pt developed a large blister where tape was applied to his right leg. OK to use paper tape.  . Imdur [Isosorbide Dinitrate] Other (See Comments)    hallucinations  . Singulair [Montelukast Sodium] Other (See Comments)    Hallucinations     Level of Care/Admitting Diagnosis ED Disposition    ED Disposition Condition [facility] Area: [place] [100120]  Level of Care: Telemetry [5]  Diagnosis: Recurrent syncope [[id number]]  Admitting Physician: Lance Coon [[id number]]  Attending Physician: Lance Coon [[id number]]  Bed request comments: 2a  PT Class (Do Not Modify): Observation [104]  PT Acc Code (Do Not Modify): Observation [10022]       B Medical/Surgery History Past Medical History:  Diagnosis Date  . Anginal pain ([place])    Left side if chest ,NTG  relieves chaes apin 12/01/13  . Anxiety   . Arthritis   . Cancer [facility])    colon cancer  . CHF (congestive heart failure) ([place])   . 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 Complication of anesthesia    wake up with a head ache  . COPD (chronic  obstructive pulmonary disease) ([place])   . Coronary artery disease   . GERD (gastroesophageal reflux disease)   . Headache    related to sinus congestion  . History of blood transfusion   . History of kidney stones   . Hypertension   . Myocardial infarction [facility])    '09 AND '12  . Peripheral vascular disease ([place])   . Shortness of breath    With exertion .  Marland Kitchen UTI (urinary tract infection)    frequent UTI   Past Surgical History:  Procedure Laterality Date  . CARDIAC CATHETERIZATION    . CHOLECYSTECTOMY    . EXTERNAL FIXATION LEG Right 10/06/2013   Procedure: CLOSED REDUCTION RIGHT TIBIAL PLATEAU FRACTURE, EXTERNAL FIXATION RIGHT LEG, PLACEMENT OF WOUND VAC;  Surgeon: Rozanna Box, MD;  Location: [place];  Service: Orthopedics;  Laterality: Right;  . FEMORAL-POPLITEAL BYPASS GRAFT Right 10/06/2013   Procedure: RIGHT POPLITEAL-POPLITEAL ARTERY BYPASS GRAFT;  Surgeon: Rosetta Posner, MD;  Location: [place];  Service: Vascular;  Laterality: Right;  . FRACTURE SURGERY Right 2014  . HARDWARE REMOVAL Right 12/02/2013   Procedure: REMOVAL EXTERNAL FIXATION RIGHT LEG ;  Surgeon: Rozanna Box, MD;  Location: [place];  Service: Orthopedics;  Laterality: Right;  . HERNIA REPAIR Right 1990's  . I&D EXTREMITY Right 10/09/2013   Procedure: IRRIGATION AND DEBRIDEMENT RIGHT LEG, CLOSURE  OF WOUNDS, PLACEMENT OF WOUND VAC ON EACH SIDE OF LEG;  Surgeon: Rozanna Box, MD;  Location: [place];  Service: Orthopedics;  Laterality: Right;  . IR NEPHROSTOMY PLACEMENT LEFT  06/19/2016  . IVC filter  2009   placed @ [facility]/ Removed in 2010.  . IVC Filter Removed    . ligament leg Left   . NEPHROLITHOTOMY Left 06/19/2016   Procedure: NEPHROLITHOTOMY PERCUTANEOUS;  Surgeon: Hollice Espy, MD;  Location: [facility];  Service: Urology;  Laterality: Left;  . ORIF TIBIA FRACTURE Right 12/29/2014   Procedure: OPEN REDUCTION INTERNAL FIXATION (ORIF) RIGHT TIBIA FRACTURE, RIA VS ICBG;  Surgeon: Altamese Fairfield, MD;  Location: [place];   Service: Orthopedics;  Laterality: Right;  . RADIOACTIVE SEED IMPLANT N/A 09/12/2016   Procedure: RADIOACTIVE SEED IMPLANT/BRACHYTHERAPY IMPLANT;  Surgeon: Hollice Espy, MD;  Location: [facility];  Service: Urology;  Laterality: N/A;     A IV Location/Drains/Wounds Patient Lines/Drains/Airways Status   Active Line/Drains/Airways    Name:   Placement date:   Placement time:   Site:   Days:   Peripheral IV 04/28/18 Right Wrist   04/28/18    1826    Wrist   less than 1   Peripheral IV 04/28/18 Left Antecubital   04/28/18    1852    Antecubital   less than 1   Incision (Closed) 09/12/16 Perineum   09/12/16    0806     593          Intake/Output Last 24 hours  Intake/Output Summary (Last 24 hours) at 04/28/2018 2241 Last data filed at 04/28/2018 2042 Gross per 24 hour  Intake 2000 ml  Output 600 ml  Net 1400 ml    Labs/Imaging Results for orders placed or performed during the hospital encounter of 04/28/18 (from the past 48 hour(s))  CBC with Differential     Status: None   Collection Time: 04/28/18  6:23 PM  Result Value Ref Range   WBC 6.0 4.0 - 10.5 K/uL   RBC 4.25 4.22 - 5.81 MIL/uL   Hemoglobin 13.9 13.0 - 17.0 g/dL   HCT 41.9 39.0 - 52.0 %   MCV 98.6 80.0 - 100.0 fL   MCH 32.7 26.0 - 34.0 pg   MCHC 33.2 30.0 - 36.0 g/dL   RDW 12.4 11.5 - 15.5 %   Platelets 204 150 - 400 K/uL   nRBC 0.0 0.0 - 0.2 %   Neutrophils Relative % 59 %   Neutro Abs 3.6 1.7 - 7.7 K/uL   Lymphocytes Relative 31 %   Lymphs Abs 1.9 0.7 - 4.0 K/uL   Monocytes Relative 7 %   Monocytes Absolute 0.4 0.1 - 1.0 K/uL   Eosinophils Relative 2 %   Eosinophils Absolute 0.1 0.0 - 0.5 K/uL   Basophils Relative 1 %   Basophils Absolute 0.0 0.0 - 0.1 K/uL   Immature Granulocytes 0 %   Abs Immature Granulocytes 0.01 0.00 - 0.07 K/uL    Comment: Performed at [facility], [address] [place], [place] [address]  Comprehensive metabolic panel     Status: Abnormal   Collection Time: 04/28/18   6:23 PM  Result Value Ref Range   Sodium 141 135 - 145 mmol/L   Potassium 2.8 (L) 3.5 - 5.1 mmol/L   Chloride 106 98 - 111 mmol/L   CO2 22 22 - 32 mmol/L   Glucose, Bld 105 (H) 70 - 99 mg/dL   BUN 13 8 - 23 mg/dL   Creatinine, Ser 1.71 (H) 0.61 - 1.24 mg/dL  Calcium 9.3 8.9 - 10.3 mg/dL   Total Protein 6.6 6.5 - 8.1 g/dL   Albumin 4.1 3.5 - 5.0 g/dL   AST 28 15 - 41 U/L   ALT 16 0 - 44 U/L   Alkaline Phosphatase 77 38 - 126 U/L   Total Bilirubin 0.6 0.3 - 1.2 mg/dL   GFR calc non Af Amer 40 (L) >60 mL/min   GFR calc Af Amer 46 (L) >60 mL/min   Anion gap 13 5 - 15    Comment: Performed at [facility], [address] [place], [place] [address]  Troponin I - ONCE - STAT     Status: None   Collection Time: 04/28/18  6:23 PM  Result Value Ref Range   Troponin I <0.03 <0.03 ng/mL    Comment: Performed at [facility], [address] [place], [place] [address]  Sample to Blood Bank     Status: None (Preliminary result)   Collection Time: 04/28/18  6:23 PM  Result Value Ref Range   Blood Bank Specimen PENDING    Sample Expiration      05/01/2018 Performed at [facility], [address] [place], [place] [address]   Urinalysis, Complete w Microscopic     Status: Abnormal   Collection Time: 04/28/18  8:39 PM  Result Value Ref Range   Color, Urine YELLOW (A) YELLOW   APPearance CLEAR (A) CLEAR   Specific Gravity, Urine 1.005 1.005 - 1.030   pH 6.0 5.0 - 8.0   Glucose, UA NEGATIVE NEGATIVE mg/dL   Hgb urine dipstick NEGATIVE NEGATIVE   Bilirubin Urine NEGATIVE NEGATIVE   Ketones, ur NEGATIVE NEGATIVE mg/dL   Protein, ur NEGATIVE NEGATIVE mg/dL   Nitrite NEGATIVE NEGATIVE   Leukocytes,Ua NEGATIVE NEGATIVE   WBC, UA 0-5 0 - 5 WBC/hpf   Bacteria, UA NONE SEEN NONE SEEN   Squamous Epithelial / LPF 0-5 0 - 5   Mucus PRESENT    Hyaline Casts, UA PRESENT     Comment: Performed at [facility], [address] [place],  [address]    No results found.  Pending Labs [employer] (From admission, onward)    Start     Ordered   Signed and Held  HIV antibody (Routine Testing)  Once,   R     Signed and Held   Signed and Held  CBC  (heparin)  Once,   R    Comments:  Baseline for heparin therapy IF NOT ALREADY DRAWN.  Notify MD if PLT < 100 K.    Signed and Held   Signed and Held  Creatinine, serum  (heparin)  Once,   R    Comments:  Baseline for heparin therapy IF NOT ALREADY DRAWN.    Signed and Held   Signed and Held  Basic metabolic panel  Tomorrow morning,   R     Signed and Held   Signed and Held  CBC  Tomorrow morning,   R     Signed and Held          Vitals/Pain Today's Vitals   04/28/18 2035 04/28/18 2100 04/28/18 2200 04/28/18 2230  BP:  130/73 138/79 120/69  Pulse:  80 77 90  Resp:  18 17 17   SpO2:  96% 97%   Weight:      Height:      PainSc: 0-No pain       Isolation Precautions No active isolations  Medications Medications  sodium chloride  0.9 % bolus 1,000 mL (0 mLs Intravenous Stopped 04/28/18 2033)  sodium chloride 0.9 % bolus 1,000 mL (0 mLs Intravenous Stopped 04/28/18 2034)    Mobility walks Low fall risk   Focused Assessments    R Recommendations: See Admitting Provider Note  Report given to: [name] RN

## 2018-04-29 ENCOUNTER — Observation Stay
Admit: 2018-04-29 | Discharge: 2018-04-29 | Disposition: A | Discharge status: Home / Self Care | Payer: Medicare Other | Attending: Cardiovascular Disease | Admitting: Cardiovascular Disease

## 2018-04-29 DIAGNOSIS — R55 Syncope and collapse: Secondary | ICD-10-CM | POA: Diagnosis not present

## 2018-04-29 LAB — CBC
HCT: 38.9 % — ABNORMAL LOW (ref 39.0–52.0)
Hemoglobin: 13 g/dL (ref 13.0–17.0)
MCH: 32.3 pg (ref 26.0–34.0)
MCHC: 33.4 g/dL (ref 30.0–36.0)
MCV: 96.5 fL (ref 80.0–100.0)
PLATELETS: 143 10*3/uL — AB (ref 150–400)
RBC: 4.03 MIL/uL — ABNORMAL LOW (ref 4.22–5.81)
RDW: 12.6 % (ref 11.5–15.5)
WBC: 3.4 10*3/uL — ABNORMAL LOW (ref 4.0–10.5)
nRBC: 0 % (ref 0.0–0.2)

## 2018-04-29 LAB — BASIC METABOLIC PANEL
Anion gap: 7 (ref 5–15)
BUN: 12 mg/dL (ref 8–23)
CALCIUM: 8.9 mg/dL (ref 8.9–10.3)
CO2: 25 mmol/L (ref 22–32)
CREATININE: 1.08 mg/dL (ref 0.61–1.24)
Chloride: 111 mmol/L (ref 98–111)
GFR calc Af Amer: >60 mL/min (ref >60)
GFR calc non Af Amer: >60 mL/min (ref >60)
Glucose, Bld: 106 mg/dL — ABNORMAL HIGH (ref 70–99)
Potassium: 3.4 mmol/L — ABNORMAL LOW (ref 3.5–5.1)
Sodium: 143 mmol/L (ref 135–145)

## 2018-04-29 MED ORDER — NICOTINE 21 MG/24HR TD PT24
21.0000 mg | MEDICATED_PATCH | Freq: Every day | TRANSDERMAL | Status: DC
  Administered 2018-04-29: 21 mg via TRANSDERMAL
  Filled 2018-04-29: qty 1

## 2018-04-29 MED ORDER — POTASSIUM CHLORIDE CRYS ER 20 MEQ PO TBCR
40.0000 meq | EXTENDED_RELEASE_TABLET | Freq: Once | ORAL | Status: AC
  Administered 2018-04-29: 40 meq via ORAL
  Filled 2018-04-29: qty 2

## 2018-04-29 MED ORDER — IBUPROFEN 400 MG PO TABS
600.0000 mg | ORAL_TABLET | Freq: Once | ORAL | Status: AC
  Administered 2018-04-29: 600 mg via ORAL
  Filled 2018-04-29: qty 2

## 2018-04-29 NOTE — Care Management (Signed)
MOON letter presented.  Tablet would not capture signature.

## 2018-04-29 NOTE — Progress Notes (Signed)
Advanced care plan. Purpose of the Encounter: CODE STATUS Parties in Attendance: Patient Patient's Decision Capacity: Good Subjective/Patient's story: Presented to the emergency room for loss of consciousness Objective/Medical story Has recurrent episodes of syncope which needs to be evaluated Needs cardiology evaluation Cycle troponin and check echocardiogram Goals of care determination:  Advance care directives goals of care treatment plan discussed Patient is full resuscitation for now CODE STATUS: Full code Time spent discussing advanced care planning: 16 minutes

## 2018-04-29 NOTE — Progress Notes (Signed)
*  PRELIMINARY RESULTS* Echocardiogram 2D Echocardiogram has been performed.  Sherrie Sport 04/29/2018, 2:36 PM

## 2018-04-29 NOTE — Care Management Note (Signed)
Case Management Note  Patient Details  Name: MATHIEU SCHLOEMER MRN: [id number] Date of Birth: 01/20/1946  Subjective/Objective:   Independent in all adls, denies issues accessing medical care, obtaining medications or with transportation.  Current with PCP.  No discharge needs identified at present by care manager or members of care team                   Action/Plan:   Expected Discharge Date:                  Expected Discharge Plan:  Home/Self Care  In-House Referral:     Discharge planning Services  CM Consult  Post Acute Care Choice:    Choice offered to:     DME Arranged:    DME Agency:     HH Arranged:  Patient Refused [place] Agency:     Status of Service:  Completed, signed off  If discussed at [employer] of Stay Meetings, dates discussed:    Additional Comments:  Elza Rafter, RN 04/29/2018, 4:16 PM

## 2018-04-29 NOTE — Plan of Care (Signed)
  Problem: Clinical Measurements: Goal: Ability to maintain clinical measurements within normal limits will improve Outcome: Not Progressing Note:  Potassium level = only 3.4 today. Supplemented with 40 mEq of K+ x 1 today. Will continue to monitor lab values. Wenda Low [facility]

## 2018-04-29 NOTE — Progress Notes (Signed)
[place] [facility] [place] NAME: Bruce [name]    MR#:  [id number]  DATE OF BIRTH:  [birth year]  SUBJECTIVE:  CHIEF COMPLAINT:   Chief Complaint  Patient presents with  . Loss of Consciousness  Patient seen and evaluated today No new episodes of syncope No complaints of chest pain No palpitations  REVIEW OF SYSTEMS:    ROS  CONSTITUTIONAL: No documented fever. No fatigue, weakness. No weight gain, no weight loss.  EYES: No blurry or double vision.  ENT: No tinnitus. No postnasal drip. No redness of the oropharynx.  RESPIRATORY: No cough, no wheeze, no hemoptysis. No dyspnea.  CARDIOVASCULAR: No chest pain. No orthopnea. No palpitations. No syncope.  GASTROINTESTINAL: No nausea, no vomiting or diarrhea. No abdominal pain. No melena or hematochezia.  GENITOURINARY: No dysuria or hematuria.  ENDOCRINE: No polyuria or nocturia. No heat or cold intolerance.  HEMATOLOGY: No anemia. No bruising. No bleeding.  INTEGUMENTARY: No rashes. No lesions.  MUSCULOSKELETAL: No arthritis. No swelling. No gout.  NEUROLOGIC: No numbness, tingling, or ataxia. No seizure-type activity.  PSYCHIATRIC: No anxiety. No insomnia. No ADD.   DRUG ALLERGIES:   Allergies  Allergen Reactions  . Adhesive [Tape] Other (See Comments)    After right leg fracture surgery, pt developed a large blister where tape was applied to his right leg. OK to use paper tape.  . Imdur [Isosorbide Dinitrate] Other (See Comments)    hallucinations  . Singulair [Montelukast Sodium] Other (See Comments)    Hallucinations     VITALS:  Blood pressure (!) 145/85, pulse (!) 53, temperature 97.6 F (36.4 C), temperature source Oral, resp. rate 16, height 6\' 1"  (1.854 m), weight 79.7 kg, SpO2 94 %.  PHYSICAL EXAMINATION:   Physical Exam  GENERAL:  73 y.o.-year-old patient lying in the bed with no acute distress.  EYES: Pupils equal, round, reactive to light and accommodation. No scleral  icterus. Extraocular muscles intact.  HEENT: Head atraumatic, normocephalic. Oropharynx and nasopharynx clear.  NECK:  Supple, no jugular venous distention. No thyroid enlargement, no tenderness.  LUNGS: Normal breath sounds bilaterally, no wheezing, rales, rhonchi. No use of accessory muscles of respiration.  CARDIOVASCULAR: S1, S2 normal. No murmurs, rubs, or gallops.  ABDOMEN: Soft, nontender, nondistended. Bowel sounds present. No organomegaly or mass.  EXTREMITIES: No cyanosis, clubbing or edema b/l.    NEUROLOGIC: Cranial nerves II through XII are intact. No focal Motor or sensory deficits b/l.   PSYCHIATRIC: The patient is alert and oriented x 3.  SKIN: No obvious rash, lesion, or ulcer.   LABORATORY PANEL:   CBC Recent Labs  Lab 04/29/18 0513  WBC 3.4*  HGB 13.0  HCT 38.9*  PLT 143*   ------------------------------------------------------------------------------------------------------------------ Chemistries  Recent Labs  Lab 04/28/18 1823 04/29/18 0513  NA 141 143  K 2.8* 3.4*  CL 106 111  CO2 22 25  GLUCOSE 105* 106*  BUN 13 12  CREATININE 1.71* 1.08  CALCIUM 9.3 8.9  AST 28  --   ALT 16  --   ALKPHOS 77  --   BILITOT 0.6  --    ------------------------------------------------------------------------------------------------------------------  Cardiac Enzymes Recent Labs  Lab 04/28/18 1823  TROPONINI <0.03   ------------------------------------------------------------------------------------------------------------------  RADIOLOGY:  No results found.   ASSESSMENT AND PLAN:  73 year old male patient with history of coronary disease, COPD, hypertension, GERD currently under hospitalist service for syncope  -Syncope Probably vasovagal Low blood pressure has been corrected with IV fluids Follow-up echocardiogram Troponin negative  Cardiology follow-up  -GERD Continue oral proton pump inhibitor  -COPD Continue home dose inhalers  -Acute  hypokalemia Replace potassium aggressively  -DVT prophylaxis subcu heparin  -Tobacco abuse Tobacco cessation counseled to the patient for 6 minutes Nicotine patch offered  All the records are reviewed and case discussed with Care Management/Social Worker. Management plans discussed with the patient, family and they are in agreement.  CODE STATUS: Full code  DVT Prophylaxis: SCDs  TOTAL TIME TAKING CARE OF THIS PATIENT: 37 minutes.   POSSIBLE D/C IN 1 DAYS, DEPENDING ON CLINICAL CONDITION.  Saundra Shelling M.D on 04/29/2018 at 10:35 AM  Between 7am to 6pm - Pager - [phone]  After 6pm go to [email or web address] - password [email or web address] [place] [facility]  Office  [phone]  CC: Primary care physician; Danelle Berry, NP  Note: This dictation was prepared with Dragon dictation along with smaller phrase technology. Any transcriptional errors that result from this process are unintentional.

## 2018-04-29 NOTE — Consult Note (Signed)
Bruce [name] is a 73 y.o. male  [id number]  Primary Cardiologist: Neoma Laming Reason for Consultation: Syncope  HPI: This is a 73 year old white male with a past medical history of coronary artery disease COPD CHF presented to the hospital with dizziness and passed out and I was asked to evaluate the patient.   Review of Systems: No chest pain   Past Medical History:  Diagnosis Date  . Anginal pain ([place])    Left side if chest ,NTG  relieves chaes apin 12/01/13  . Anxiety   . Arthritis   . Cancer [facility])    colon cancer  . CHF (congestive heart failure) ([place])   . 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 Complication of anesthesia    wake up with a head ache  . COPD (chronic obstructive pulmonary disease) ([place])   . Coronary artery disease   . GERD (gastroesophageal reflux disease)   . Headache    related to sinus congestion  . History of blood transfusion   . History of kidney stones   . Hypertension   . Myocardial infarction [facility])    '09 AND '12  . Peripheral vascular disease ([place])   . Shortness of breath    With exertion .  Marland Kitchen UTI (urinary tract infection)    frequent UTI    Medications Prior to Admission  Medication Sig Dispense Refill  . allopurinol (ZYLOPRIM) 100 MG tablet Take 100 mg by mouth at bedtime.    Marland Kitchen atorvastatin (LIPITOR) 80 MG tablet Take 80 mg by mouth at bedtime.     . benazepril (LOTENSIN) 20 MG tablet Take 20 mg by mouth daily.     . Cetirizine HCl (ALL DAY ALLERGY PO) Take 1 tablet by mouth daily.    . cholecalciferol (VITAMIN D) 1000 units tablet Take 1,000 Units by mouth daily.     . fluticasone (FLONASE) 50 MCG/ACT nasal spray Place 1 spray into both nostrils daily.    . furosemide (LASIX) 20 MG tablet Take 20 mg by mouth daily.    . metoprolol succinate (TOPROL-XL) 50 MG 24 hr tablet Take 1 tablet (50 mg total) by mouth daily. (Patient taking differently: Take 50 mg by mouth at bedtime. ) 90  tablet 0  . tamsulosin (FLOMAX) 0.4 MG CAPS capsule TAKE ONE CAPSULE BY MOUTH ONCE DAILY AFTER SUPPER 30 capsule 11  . traZODone (DESYREL) 150 MG tablet Take 1 tablet (150 mg total) by mouth at bedtime. 30 tablet 2  . DOCOSAHEXAENOIC ACID PO Take 1 capsule by mouth daily.     Marland Kitchen gabapentin (NEURONTIN) 300 MG capsule     . loperamide (IMODIUM) 2 MG capsule Take by mouth as needed for diarrhea or loose stools.    . mirtazapine (REMERON) 15 MG tablet Take 1 tablet (15 mg total) by mouth at bedtime. 30 tablet 1  . montelukast (SINGULAIR) 10 MG tablet     . Multiple Vitamin (MULTIVITAMIN) tablet Take 1 tablet by mouth daily. For Men    . nitroGLYCERIN (NITROSTAT) 0.4 MG SL tablet Place 0.4 mg under the tongue every 5 (five) minutes as needed for chest pain.     . Omega-3 Fatty Acids (FISH OIL PO) Take 1,000 mg by mouth daily.     Marland Kitchen omeprazole (PRILOSEC) 40 MG capsule Take 40 mg by mouth daily.    . potassium chloride (K-DUR) 10 MEQ tablet Take 10 mEq by mouth daily.     Marland Kitchen  Probiotic Product (PROBIOTIC FORMULA PO) Take 1 capsule by mouth at bedtime.     . ranolazine (RANEXA) 1000 MG SR tablet Take 1,000 mg by mouth every morning.     . rivaroxaban (XARELTO) 20 MG TABS tablet Take 20 mg by mouth daily with supper.       Marland Kitchen atorvastatin  80 mg Oral QHS  . heparin  5,000 Units Subcutaneous Q8H  . mirtazapine  15 mg Oral QHS  . pantoprazole  40 mg Oral Daily  . ranolazine  1,000 mg Oral BH-q7a  . tamsulosin  0.4 mg Oral QPC supper    Infusions:   Allergies  Allergen Reactions  . Adhesive [Tape] Other (See Comments)    After right leg fracture surgery, pt developed a large blister where tape was applied to his right leg. OK to use paper tape.  . Imdur [Isosorbide Dinitrate] Other (See Comments)    hallucinations  . Singulair [Montelukast Sodium] Other (See Comments)    Hallucinations     Social History   Socioeconomic History  . Marital status: Single    Spouse name: Not on file  .  Number of children: 2  . Years of education: Not on file  . Highest education level: 6th grade  Occupational History  . Not on file  Social Needs  . Financial resource strain: Very hard  . Food insecurity:    Worry: Often true    Inability: Often true  . Transportation needs:    Medical: No    Non-medical: No  Tobacco Use  . Smoking status: Current Some Day Smoker    Packs/day: 0.50    Years: 52.00    Pack years: 26.00  . Smokeless tobacco: Never Used  Substance and Sexual Activity  . Alcohol use: No    Alcohol/week: 0.0 standard drinks    Comment: Stopped 2009  . Drug use: No  . Sexual activity: Not Currently  Lifestyle  . Physical activity:    Days per week: 0 days    Minutes per session: 0 min  . Stress: Rather much  Relationships  . Social connections:    Talks on phone: More than three times a week    Gets together: Once a week    Attends religious service: Never    Active member of club or organization: No    Attends meetings of clubs or organizations: Never    Relationship status: Divorced  . Intimate partner violence:    Fear of current or ex partner: No    Emotionally abused: No    Physically abused: No    Forced sexual activity: No  Other Topics Concern  . Not on file  Social History Narrative   ** Merged History Encounter **        Family History  Problem Relation Age of Onset  . Hypertension Mother   . Prostate cancer Brother   . Mental illness Neg Hx     PHYSICAL EXAM: Vitals:   04/29/18 0508 04/29/18 0733  BP: (!) 144/72 (!) 145/85  Pulse: 67 (!) 53  Resp: 17 16  Temp: (!) 97.5 F (36.4 C) 97.6 F (36.4 C)  SpO2: 94% 94%     Intake/Output Summary (Last 24 hours) at 04/29/2018 0752 Last data filed at 04/29/2018 0504 Gross per 24 hour  Intake 2000 ml  Output 900 ml  Net 1100 ml    General:  Well appearing. No respiratory difficulty HEENT: normal Neck: supple. no JVD. Carotids 2+ bilat; no bruits.  No lymphadenopathy or  thryomegaly appreciated. Cor: PMI nondisplaced. Regular rate & rhythm. No rubs, gallops or murmurs. Lungs: clear Abdomen: soft, nontender, nondistended. No hepatosplenomegaly. No bruits or masses. Good bowel sounds. Extremities: no cyanosis, clubbing, rash, edema Neuro: alert & oriented x 3, cranial nerves grossly intact. moves all 4 extremities w/o difficulty. Affect pleasant.  ECG: Normal sinus rhythm on monitor at 80 bpm with no EKG done  Results for orders placed or performed during the hospital encounter of 04/28/18 (from the past 24 hour(s))  CBC with Differential     Status: None   Collection Time: 04/28/18  6:23 PM  Result Value Ref Range   WBC 6.0 4.0 - 10.5 K/uL   RBC 4.25 4.22 - 5.81 MIL/uL   Hemoglobin 13.9 13.0 - 17.0 g/dL   HCT 41.9 39.0 - 52.0 %   MCV 98.6 80.0 - 100.0 fL   MCH 32.7 26.0 - 34.0 pg   MCHC 33.2 30.0 - 36.0 g/dL   RDW 12.4 11.5 - 15.5 %   Platelets 204 150 - 400 K/uL   nRBC 0.0 0.0 - 0.2 %   Neutrophils Relative % 59 %   Neutro Abs 3.6 1.7 - 7.7 K/uL   Lymphocytes Relative 31 %   Lymphs Abs 1.9 0.7 - 4.0 K/uL   Monocytes Relative 7 %   Monocytes Absolute 0.4 0.1 - 1.0 K/uL   Eosinophils Relative 2 %   Eosinophils Absolute 0.1 0.0 - 0.5 K/uL   Basophils Relative 1 %   Basophils Absolute 0.0 0.0 - 0.1 K/uL   Immature Granulocytes 0 %   Abs Immature Granulocytes 0.01 0.00 - 0.07 K/uL  Comprehensive metabolic panel     Status: Abnormal   Collection Time: 04/28/18  6:23 PM  Result Value Ref Range   Sodium 141 135 - 145 mmol/L   Potassium 2.8 (L) 3.5 - 5.1 mmol/L   Chloride 106 98 - 111 mmol/L   CO2 22 22 - 32 mmol/L   Glucose, Bld 105 (H) 70 - 99 mg/dL   BUN 13 8 - 23 mg/dL   Creatinine, Ser 1.71 (H) 0.61 - 1.24 mg/dL   Calcium 9.3 8.9 - 10.3 mg/dL   Total Protein 6.6 6.5 - 8.1 g/dL   Albumin 4.1 3.5 - 5.0 g/dL   AST 28 15 - 41 U/L   ALT 16 0 - 44 U/L   Alkaline Phosphatase 77 38 - 126 U/L   Total Bilirubin 0.6 0.3 - 1.2 mg/dL   GFR calc non  Af Amer 40 (L) >60 mL/min   GFR calc Af Amer 46 (L) >60 mL/min   Anion gap 13 5 - 15  Troponin I - ONCE - STAT     Status: None   Collection Time: 04/28/18  6:23 PM  Result Value Ref Range   Troponin I <0.03 <0.03 ng/mL  Urinalysis, Complete w Microscopic     Status: Abnormal   Collection Time: 04/28/18  8:39 PM  Result Value Ref Range   Color, Urine YELLOW (A) YELLOW   APPearance CLEAR (A) CLEAR   Specific Gravity, Urine 1.005 1.005 - 1.030   pH 6.0 5.0 - 8.0   Glucose, UA NEGATIVE NEGATIVE mg/dL   Hgb urine dipstick NEGATIVE NEGATIVE   Bilirubin Urine NEGATIVE NEGATIVE   Ketones, ur NEGATIVE NEGATIVE mg/dL   Protein, ur NEGATIVE NEGATIVE mg/dL   Nitrite NEGATIVE NEGATIVE   Leukocytes,Ua NEGATIVE NEGATIVE   WBC, UA 0-5 0 - 5 WBC/hpf   Bacteria, UA  NONE SEEN NONE SEEN   Squamous Epithelial / LPF 0-5 0 - 5   Mucus PRESENT    Hyaline Casts, UA PRESENT   Basic metabolic panel     Status: Abnormal   Collection Time: 04/29/18  5:13 AM  Result Value Ref Range   Sodium 143 135 - 145 mmol/L   Potassium 3.4 (L) 3.5 - 5.1 mmol/L   Chloride 111 98 - 111 mmol/L   CO2 25 22 - 32 mmol/L   Glucose, Bld 106 (H) 70 - 99 mg/dL   BUN 12 8 - 23 mg/dL   Creatinine, Ser 1.08 0.61 - 1.24 mg/dL   Calcium 8.9 8.9 - 10.3 mg/dL   GFR calc non Af Amer >60 >60 mL/min   GFR calc Af Amer >60 >60 mL/min   Anion gap 7 5 - 15  CBC     Status: Abnormal   Collection Time: 04/29/18  5:13 AM  Result Value Ref Range   WBC 3.4 (L) 4.0 - 10.5 K/uL   RBC 4.03 (L) 4.22 - 5.81 MIL/uL   Hemoglobin 13.0 13.0 - 17.0 g/dL   HCT 38.9 (L) 39.0 - 52.0 %   MCV 96.5 80.0 - 100.0 fL   MCH 32.3 26.0 - 34.0 pg   MCHC 33.4 30.0 - 36.0 g/dL   RDW 12.6 11.5 - 15.5 %   Platelets 143 (L) 150 - 400 K/uL   nRBC 0.0 0.0 - 0.2 %   No results found.   ASSESSMENT AND PLAN: Syncopal episode with etiology unclear advise ruling out myocardial infarction and give IV fluid and get an EKG and echocardiogram.  Will follow the  patient closely with you.  Raynesha Tiedt A

## 2018-04-30 DIAGNOSIS — R55 Syncope and collapse: Secondary | ICD-10-CM | POA: Diagnosis not present

## 2018-04-30 LAB — ECHOCARDIOGRAM COMPLETE
Height: 73 in
Weight: 2812.8 oz

## 2018-04-30 LAB — POTASSIUM: Potassium: 4.2 mmol/L (ref 3.5–5.1)

## 2018-04-30 LAB — HIV ANTIBODY (ROUTINE TESTING W REFLEX): HIV Screen 4th Generation wRfx: NONREACTIVE

## 2018-04-30 MED ORDER — FLUTICASONE PROPIONATE 50 MCG/ACT NA SUSP: 1.0000 | Freq: Every day | NASAL | 0 refills | Status: DC

## 2018-04-30 MED ORDER — MIRTAZAPINE 15 MG PO TABS: 15.0000 mg | ORAL_TABLET | Freq: Every day | ORAL | 0 refills | Status: DC

## 2018-04-30 MED ORDER — ALBUTEROL SULFATE HFA 108 (90 BASE) MCG/ACT IN AERS: 2.0000 | INHALATION_SPRAY | Freq: Four times a day (QID) | RESPIRATORY_TRACT | 2 refills | Status: DC | PRN

## 2018-04-30 MED ORDER — ATORVASTATIN CALCIUM 80 MG PO TABS: 80.0000 mg | ORAL_TABLET | Freq: Every day | ORAL | 0 refills | Status: DC

## 2018-04-30 NOTE — Progress Notes (Signed)
SUBJECTIVE: Patient is feeling much better   Vitals:   04/29/18 1549 04/29/18 1929 04/30/18 0328 04/30/18 0817  BP: 135/75 (!) 154/86 (!) 148/92 (!) 145/82  Pulse: 60 66 66 66  Resp: 18 18 20 18   Temp: 98.5 F (36.9 C) 98 F (36.7 C) (!) 97.5 F (36.4 C) 97.7 F (36.5 C)  TempSrc: Oral Oral Oral   SpO2: 94% 97% 97% 96%  Weight:   79.7 kg   Height:        Intake/Output Summary (Last 24 hours) at 04/30/2018 1021 Last data filed at 04/30/2018 0700 Gross per 24 hour  Intake 1200 ml  Output 2800 ml  Net -1600 ml    LABS: Basic Metabolic Panel: Recent Labs    04/28/18 1823 04/29/18 0513 04/30/18 0321  NA 141 143  --   K 2.8* 3.4* 4.2  CL 106 111  --   CO2 22 25  --   GLUCOSE 105* 106*  --   BUN 13 12  --   CREATININE 1.71* 1.08  --   CALCIUM 9.3 8.9  --    Liver Function Tests: Recent Labs    04/28/18 1823  AST 28  ALT 16  ALKPHOS 77  BILITOT 0.6  PROT 6.6  ALBUMIN 4.1   No results for input(s): LIPASE, AMYLASE in the last 72 hours. CBC: Recent Labs    04/28/18 1823 04/29/18 0513  WBC 6.0 3.4*  NEUTROABS 3.6  --   HGB 13.9 13.0  HCT 41.9 38.9*  MCV 98.6 96.5  PLT 204 143*   Cardiac Enzymes: Recent Labs    04/28/18 1823  TROPONINI <0.03   BNP: Invalid input(s): POCBNP D-Dimer: No results for input(s): DDIMER in the last 72 hours. Hemoglobin A1C: No results for input(s): HGBA1C in the last 72 hours. Fasting Lipid Panel: No results for input(s): CHOL, HDL, LDLCALC, TRIG, CHOLHDL, LDLDIRECT in the last 72 hours. Thyroid Function Tests: No results for input(s): TSH, T4TOTAL, T3FREE, THYROIDAB in the last 72 hours.  Invalid input(s): FREET3 Anemia Panel: No results for input(s): VITAMINB12, FOLATE, FERRITIN, TIBC, IRON, RETICCTPCT in the last 72 hours.   PHYSICAL EXAM General: Well developed, well nourished, in no acute distress HEENT:  Normocephalic and atramatic Neck:  No JVD.  Lungs: Clear bilaterally to auscultation and  percussion. Heart: HRRR . Normal S1 and S2 without gallops or murmurs.  Abdomen: Bowel sounds are positive, abdomen soft and non-tender  Msk:  Back normal, normal gait. Normal strength and tone for age. Extremities: No clubbing, cyanosis or edema.   Neuro: Alert and oriented X 3. Psych:  Good affect, responds appropriately  TELEMETRY: Sinus rhythm  ASSESSMENT AND PLAN: Syncopal episode with no arrhythmias noted on the monitor.  Patient can be discharged with follow-up in the office Thursday at 10 AM.  Principal Problem:   Recurrent syncope Active Problems:   CAD (coronary artery disease)   COPD (chronic obstructive pulmonary disease) (HCC)   Hypertension   GERD (gastroesophageal reflux disease)    Bruce David, MD, [facility] 04/30/2018 10:21 AM

## 2018-04-30 NOTE — Discharge Summary (Addendum)
[place] [facility] [place] NAME: Bruce [name]    MR#:  [id number]  DATE OF BIRTH:  1945/03/19  DATE OF ADMISSION:  04/28/2018 ADMITTING PHYSICIAN: Lance Coon, MD  DATE OF DISCHARGE: 04/30/2018  PRIMARY CARE PHYSICIAN: Danelle Berry, NP   ADMISSION DIAGNOSIS:  Syncope, unspecified syncope type [R55]  DISCHARGE DIAGNOSIS:  Principal Problem:   Recurrent syncope Active Problems:   CAD (coronary artery disease)   COPD (chronic obstructive pulmonary disease) (HCC)   Hypertension   GERD (gastroesophageal reflux disease)   SECONDARY DIAGNOSIS:   Past Medical History:  Diagnosis Date  . Anginal pain ([place])    Left side if chest ,NTG  relieves chaes apin 12/01/13  . Anxiety   . Arthritis   . Cancer [facility])    colon cancer  . CHF (congestive heart failure) ([place])   . 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 Complication of anesthesia    wake up with a head ache  . COPD (chronic obstructive pulmonary disease) ([place])   . Coronary artery disease   . GERD (gastroesophageal reflux disease)   . Headache    related to sinus congestion  . History of blood transfusion   . History of kidney stones   . Hypertension   . Myocardial infarction [facility])    '09 AND '12  . Peripheral vascular disease ([place])   . Shortness of breath    With exertion .  Marland Kitchen UTI (urinary tract infection)    frequent UTI     ADMITTING HISTORY [name]  is a 73 y.o. male who presents with chief complaint as above.  Patient presents to the ED after 2 episodes of syncope.  He states he does not remember the episode, though he does remember feeling weak and lightheaded just prior.  Evaluation here in the ED is largely within normal limits except that he was initially hypotensive, though blood pressure was corrected with IV fluid administration.  Hospitalist were called for admission and further evaluation  HOSPITAL COURSE:  Patient  admitted to telemetry.  Troponins were negative.  Was evaluated by [facility] cardiology.  No arrhythmias noted on telemetry monitoring.  Outpatient cardiac stress test recommended.  No new episodes of syncope in the hospitalization. Cardiogram was done which showed mildly reduced systolic function with EF of 45 to 50%. CONSULTS OBTAINED:  Treatment Team:  [name], MD  DRUG ALLERGIES:   Allergies  Allergen Reactions  . Adhesive [Tape] Other (See Comments)    After right leg fracture surgery, pt developed a large blister where tape was applied to his right leg. OK to use paper tape.  . Imdur [Isosorbide Dinitrate] Other (See Comments)    hallucinations  . Singulair [Montelukast Sodium] Other (See Comments)    Hallucinations     DISCHARGE MEDICATIONS:   Allergies as of 04/30/2018      Reactions   Adhesive [tape] Other (See Comments)   After right leg fracture surgery, pt developed a large blister where tape was applied to his right leg. OK to use paper tape.   Imdur [isosorbide Dinitrate] Other (See Comments)   hallucinations   Singulair [montelukast Sodium] Other (See Comments)   Hallucinations      Medication List    STOP taking these medications   DOCOSAHEXAENOIC ACID PO     TAKE these medications   albuterol 108 (90 Base) MCG/ACT inhaler Commonly known as:  PROVENTIL HFA;VENTOLIN  HFA Inhale 2 puffs into the lungs every 6 (six) hours as needed for wheezing or shortness of breath.   ALL DAY ALLERGY PO Take 1 tablet by mouth daily.   allopurinol 100 MG tablet Commonly known as:  ZYLOPRIM Take 100 mg by mouth at bedtime.   atorvastatin 80 MG tablet Commonly known as:  LIPITOR Take 1 tablet (80 mg total) by mouth at bedtime for 30 days.   benazepril 20 MG tablet Commonly known as:  LOTENSIN Take 20 mg by mouth daily.   cholecalciferol 25 MCG (1000 UT) tablet Commonly known as:  VITAMIN D Take 1,000 Units by mouth daily.   FISH OIL PO Take  1,000 mg by mouth daily.   fluticasone 50 MCG/ACT nasal spray Commonly known as:  FLONASE Place 1 spray into both nostrils daily for 30 days.   furosemide 20 MG tablet Commonly known as:  LASIX Take 20 mg by mouth daily.   gabapentin 300 MG capsule Commonly known as:  NEURONTIN   loperamide 2 MG capsule Commonly known as:  IMODIUM Take by mouth as needed for diarrhea or loose stools.   losartan 50 MG tablet Commonly known as:  COZAAR Take 50 mg by mouth daily.   metoprolol succinate 50 MG 24 hr tablet Commonly known as:  TOPROL-XL Take 1 tablet (50 mg total) by mouth daily.   mirtazapine 15 MG tablet Commonly known as:  REMERON Take 1 tablet (15 mg total) by mouth at bedtime for 30 days.   montelukast 10 MG tablet Commonly known as:  SINGULAIR   multivitamin tablet Take 1 tablet by mouth daily. For Men   nitroGLYCERIN 0.4 MG SL tablet Commonly known as:  NITROSTAT Place 0.4 mg under the tongue every 5 (five) minutes as needed for chest pain.   omeprazole 40 MG capsule Commonly known as:  PRILOSEC Take 40 mg by mouth daily.   potassium chloride 10 MEQ tablet Commonly known as:  K-DUR Take 10 mEq by mouth daily.   PROBIOTIC FORMULA PO Take 1 capsule by mouth at bedtime.   RANEXA 1000 MG SR tablet Generic drug:  ranolazine Take 1,000 mg by mouth every morning.   rivaroxaban 20 MG Tabs tablet Commonly known as:  XARELTO Take 20 mg by mouth daily with supper.   tamsulosin 0.4 MG Caps capsule Commonly known as:  FLOMAX TAKE ONE CAPSULE BY MOUTH ONCE DAILY AFTER SUPPER   traZODone 150 MG tablet Commonly known as:  DESYREL Take 1 tablet (150 mg total) by mouth at bedtime.       Today  Patient seen today No chest pain No palpitations Hemodynamically stable  VITAL SIGNS:  Blood pressure (!) 145/82, pulse 66, temperature 97.7 F (36.5 C), resp. rate 18, height 6\' 1"  (1.854 m), weight 79.7 kg, SpO2 96 %.  I/O:    Intake/Output Summary (Last 24  hours) at 04/30/2018 1212 Last data filed at 04/30/2018 0700 Gross per 24 hour  Intake 1200 ml  Output 2800 ml  Net -1600 ml    PHYSICAL EXAMINATION:  Physical Exam  GENERAL:  73 y.o.-year-old patient lying in the bed with no acute distress.  LUNGS: Normal breath sounds bilaterally, no wheezing, rales,rhonchi or crepitation. No use of accessory muscles of respiration.  CARDIOVASCULAR: S1, S2 normal. No murmurs, rubs, or gallops.  ABDOMEN: Soft, non-tender, non-distended. Bowel sounds present. No organomegaly or mass.  NEUROLOGIC: Moves all 4 extremities. PSYCHIATRIC: The patient is alert and oriented x 3.  SKIN: No obvious rash, lesion,  or ulcer.   DATA REVIEW:   CBC Recent Labs  Lab 04/29/18 0513  WBC 3.4*  HGB 13.0  HCT 38.9*  PLT 143*    Chemistries  Recent Labs  Lab 04/28/18 1823 04/29/18 0513 04/30/18 0321  NA 141 143  --   K 2.8* 3.4* 4.2  CL 106 111  --   CO2 22 25  --   GLUCOSE 105* 106*  --   BUN 13 12  --   CREATININE 1.71* 1.08  --   CALCIUM 9.3 8.9  --   AST 28  --   --   ALT 16  --   --   ALKPHOS 77  --   --   BILITOT 0.6  --   --     Cardiac Enzymes Recent Labs  Lab 04/28/18 1823  TROPONINI <0.03    Microbiology Results  Results for orders placed or performed during the hospital encounter of 06/19/16  Stone analysis     Status: None   Collection Time: 06/19/16  2:35 PM  Result Value Ref Range Status   Color Owens Shark  Final   Size Comment mm Final    Comment: Specimen received as fragments.   Stone Weight KSTONE 1,522.9 mg Corrected   Nidus No Nidus visualized  Corrected   Ca Oxalate,Monohydr. 90 % Corrected   Ca phos cry stone ql IR 10 % Corrected   Composition Comment  Corrected    Comment: Percentage (Represents the % composition)   STONE COMMENT Note:  Corrected    Comment: (NOTE) Please do not submit specimens on Q-Tips, in tape, on filters, or in liquids such as blood, urine or formalin.  This may cause unnecessary biohazards,  erroneous results and/or delay in the processing of the specimen.    Photo Comment  Corrected    Comment: Photograph will follow under separate cover.   Comment: Comment  Corrected    Comment: (NOTE) Physician questions regarding Calculi Analysis contact [facility] at: [phone].    PLEASE NOTE: Comment  Corrected    Comment: (NOTE) Calculi report with photograph will follow via computer, mail or courier delivery.    Disclaimer - Kidney Stone Analysis: Comment  Corrected    Comment: (NOTE) This test was developed and its performance characteristics determined by [facility]. It has not been cleared or approved by the Food and Drug Administration. Performed At: [facility] [place], [place] [id number] Lindon Romp MD UD:[id number]     RADIOLOGY:  No results found.  Follow up with PCP in 1 week.  Management plans discussed with the patient, family and they are in agreement.  CODE STATUS: Full code    Code Status Orders  (From admission, onward)         Start     Ordered   04/28/18 2258  Full code  Continuous     04/28/18 2257        Code Status History    Date Active Date Inactive Code Status Order ID Comments User Context   06/19/2016 1643 06/20/2016 2153 Full Code [id number]  Hollice Espy, MD Inpatient   12/29/2014 2357 01/01/2015 1731 Full Code [id number]  Danne Baxter Inpatient   10/06/2013 1929 10/11/2013 1800 Full Code [id number]  Lisette Abu, PA-C Inpatient      TOTAL TIME TAKING CARE OF THIS PATIENT ON DAY OF DISCHARGE: more than 35 minutes.   Saundra Shelling M.D on 04/30/2018 at 12:12 PM  Between 7am to 6pm -  Pager - [phone]  After 6pm go to [email or web address] - password [email or web address]  Office  [phone]  CC: Primary care physician; Danelle Berry, NP  Note: This dictation was prepared with Dragon dictation along with smaller phrase technology. Any transcriptional errors that result  from this process are unintentional.

## 2018-05-03 ENCOUNTER — Other Ambulatory Visit: Payer: Self-pay | Admitting: Psychiatry

## 2018-05-03 DIAGNOSIS — F330 Major depressive disorder, recurrent, mild: Secondary | ICD-10-CM

## 2018-05-10 ENCOUNTER — Other Ambulatory Visit: Payer: Self-pay | Admitting: Otolaryngology

## 2018-05-10 ENCOUNTER — Other Ambulatory Visit: Payer: Self-pay

## 2018-05-10 DIAGNOSIS — IMO0001 Reserved for inherently not codable concepts without codable children: Secondary | ICD-10-CM

## 2018-05-10 DIAGNOSIS — H9042 Sensorineural hearing loss, unilateral, left ear, with unrestricted hearing on the contralateral side: Secondary | ICD-10-CM

## 2018-05-21 ENCOUNTER — Other Ambulatory Visit: Payer: Self-pay

## 2018-05-21 ENCOUNTER — Ambulatory Visit
Admission: RE | Admit: 2018-05-21 | Discharge: 2018-05-21 | Disposition: A | Discharge status: Home / Self Care | Payer: Medicare Other | Source: Ambulatory Visit | Attending: Otolaryngology | Admitting: Otolaryngology

## 2018-05-21 DIAGNOSIS — H9042 Sensorineural hearing loss, unilateral, left ear, with unrestricted hearing on the contralateral side: Secondary | ICD-10-CM | POA: Insufficient documentation

## 2018-05-21 DIAGNOSIS — IMO0001 Reserved for inherently not codable concepts without codable children: Secondary | ICD-10-CM

## 2018-05-21 MED ORDER — GADOBUTROL 1 MMOL/ML IV SOLN
7.0000 mL | Freq: Once | INTRAVENOUS | Status: AC | PRN
  Administered 2018-05-21: 7 mL via INTRAVENOUS

## 2018-06-17 ENCOUNTER — Other Ambulatory Visit: Payer: Self-pay | Admitting: Psychiatry

## 2018-06-17 DIAGNOSIS — F330 Major depressive disorder, recurrent, mild: Secondary | ICD-10-CM

## 2018-06-18 ENCOUNTER — Other Ambulatory Visit: Payer: Self-pay | Admitting: Psychiatry

## 2018-06-18 DIAGNOSIS — F330 Major depressive disorder, recurrent, mild: Secondary | ICD-10-CM

## 2018-10-10 ENCOUNTER — Inpatient Hospital Stay
Admission: EM | Admit: 2018-10-10 | Discharge: 2018-10-11 | DRG: 282 | Disposition: A | Discharge status: Home / Self Care | Payer: Medicare Other | Attending: Internal Medicine | Admitting: Internal Medicine

## 2018-10-10 ENCOUNTER — Other Ambulatory Visit: Payer: Self-pay

## 2018-10-10 ENCOUNTER — Emergency Department: Payer: Medicare Other

## 2018-10-10 DIAGNOSIS — Z7951 Long term (current) use of inhaled steroids: Secondary | ICD-10-CM | POA: Diagnosis not present

## 2018-10-10 DIAGNOSIS — R079 Chest pain, unspecified: Secondary | ICD-10-CM

## 2018-10-10 DIAGNOSIS — Z9049 Acquired absence of other specified parts of digestive tract: Secondary | ICD-10-CM

## 2018-10-10 DIAGNOSIS — E785 Hyperlipidemia, unspecified: Secondary | ICD-10-CM | POA: Diagnosis present

## 2018-10-10 DIAGNOSIS — Z87442 Personal history of urinary calculi: Secondary | ICD-10-CM | POA: Diagnosis not present

## 2018-10-10 DIAGNOSIS — Z716 Tobacco abuse counseling: Secondary | ICD-10-CM | POA: Diagnosis not present

## 2018-10-10 DIAGNOSIS — F419 Anxiety disorder, unspecified: Secondary | ICD-10-CM | POA: Diagnosis present

## 2018-10-10 DIAGNOSIS — J302 Other seasonal allergic rhinitis: Secondary | ICD-10-CM | POA: Diagnosis present

## 2018-10-10 DIAGNOSIS — J449 Chronic obstructive pulmonary disease, unspecified: Secondary | ICD-10-CM | POA: Diagnosis present

## 2018-10-10 DIAGNOSIS — I10 Essential (primary) hypertension: Secondary | ICD-10-CM | POA: Diagnosis present

## 2018-10-10 DIAGNOSIS — I739 Peripheral vascular disease, unspecified: Secondary | ICD-10-CM | POA: Diagnosis present

## 2018-10-10 DIAGNOSIS — K219 Gastro-esophageal reflux disease without esophagitis: Secondary | ICD-10-CM | POA: Diagnosis present

## 2018-10-10 DIAGNOSIS — Z7901 Long term (current) use of anticoagulants: Secondary | ICD-10-CM

## 2018-10-10 DIAGNOSIS — Z79899 Other long term (current) drug therapy: Secondary | ICD-10-CM

## 2018-10-10 DIAGNOSIS — I25119 Atherosclerotic heart disease of native coronary artery with unspecified angina pectoris: Secondary | ICD-10-CM | POA: Diagnosis present

## 2018-10-10 DIAGNOSIS — Z87440 Personal history of urinary (tract) infections: Secondary | ICD-10-CM

## 2018-10-10 DIAGNOSIS — Z66 Do not resuscitate: Secondary | ICD-10-CM | POA: Diagnosis present

## 2018-10-10 DIAGNOSIS — Z86718 Personal history of other venous thrombosis and embolism: Secondary | ICD-10-CM | POA: Diagnosis not present

## 2018-10-10 DIAGNOSIS — Z91048 Other nonmedicinal substance allergy status: Secondary | ICD-10-CM

## 2018-10-10 DIAGNOSIS — Z8249 Family history of ischemic heart disease and other diseases of the circulatory system: Secondary | ICD-10-CM

## 2018-10-10 DIAGNOSIS — F17210 Nicotine dependence, cigarettes, uncomplicated: Secondary | ICD-10-CM | POA: Diagnosis present

## 2018-10-10 DIAGNOSIS — Z888 Allergy status to other drugs, medicaments and biological substances status: Secondary | ICD-10-CM

## 2018-10-10 DIAGNOSIS — Z20828 Contact with and (suspected) exposure to other viral communicable diseases: Secondary | ICD-10-CM | POA: Diagnosis present

## 2018-10-10 DIAGNOSIS — Z85038 Personal history of other malignant neoplasm of large intestine: Secondary | ICD-10-CM | POA: Diagnosis not present

## 2018-10-10 DIAGNOSIS — Z79891 Long term (current) use of opiate analgesic: Secondary | ICD-10-CM | POA: Diagnosis not present

## 2018-10-10 DIAGNOSIS — I214 Non-ST elevation (NSTEMI) myocardial infarction: Principal | ICD-10-CM | POA: Diagnosis present

## 2018-10-10 DIAGNOSIS — I252 Old myocardial infarction: Secondary | ICD-10-CM | POA: Diagnosis not present

## 2018-10-10 LAB — TROPONIN I (HIGH SENSITIVITY)
Troponin I (High Sensitivity): 40 ng/L — ABNORMAL HIGH (ref <18)
Troponin I (High Sensitivity): 243 ng/L (ref <18)
Troponin I (High Sensitivity): 765 ng/L (ref <18)
Troponin I (High Sensitivity): 1274 ng/L (ref <18)

## 2018-10-10 LAB — CBC
HCT: 43 % (ref 39.0–52.0)
Hemoglobin: 14.5 g/dL (ref 13.0–17.0)
MCH: 32.9 pg (ref 26.0–34.0)
MCHC: 33.7 g/dL (ref 30.0–36.0)
MCV: 97.5 fL (ref 80.0–100.0)
Platelets: 209 10*3/uL (ref 150–400)
RBC: 4.41 MIL/uL (ref 4.22–5.81)
RDW: 12 % (ref 11.5–15.5)
WBC: 7 10*3/uL (ref 4.0–10.5)
nRBC: 0 % (ref 0.0–0.2)

## 2018-10-10 LAB — APTT: aPTT: 29 seconds (ref 24–36)

## 2018-10-10 LAB — SARS CORONAVIRUS 2 BY RT PCR (HOSPITAL ORDER, PERFORMED IN ~~LOC~~ HOSPITAL LAB): SARS Coronavirus 2: NEGATIVE

## 2018-10-10 LAB — BASIC METABOLIC PANEL
Anion gap: 9 (ref 5–15)
BUN: 13 mg/dL (ref 8–23)
CO2: 23 mmol/L (ref 22–32)
Calcium: 9.5 mg/dL (ref 8.9–10.3)
Chloride: 107 mmol/L (ref 98–111)
Creatinine, Ser: 1.04 mg/dL (ref 0.61–1.24)
GFR calc Af Amer: >60 mL/min (ref >60)
GFR calc non Af Amer: >60 mL/min (ref >60)
Glucose, Bld: 116 mg/dL — ABNORMAL HIGH (ref 70–99)
Potassium: 4.2 mmol/L (ref 3.5–5.1)
Sodium: 139 mmol/L (ref 135–145)

## 2018-10-10 LAB — PROTIME-INR
INR: 1 (ref 0.8–1.2)
Prothrombin Time: 13.5 seconds (ref 11.4–15.2)

## 2018-10-10 LAB — HEPARIN LEVEL (UNFRACTIONATED): Heparin Unfractionated: <0.1 IU/mL — ABNORMAL LOW (ref 0.30–0.70)

## 2018-10-10 MED ORDER — LOSARTAN POTASSIUM 50 MG PO TABS
50.0000 mg | ORAL_TABLET | Freq: Every day | ORAL | Status: DC
  Administered 2018-10-11: 50 mg via ORAL
  Filled 2018-10-10: qty 1

## 2018-10-10 MED ORDER — DOCUSATE SODIUM 100 MG PO CAPS: 100.0000 mg | ORAL_CAPSULE | Freq: Two times a day (BID) | ORAL | Status: DC | PRN

## 2018-10-10 MED ORDER — ASPIRIN 81 MG PO CHEW
324.0000 mg | CHEWABLE_TABLET | Freq: Once | ORAL | Status: AC
  Administered 2018-10-10: 18:00:00 324 mg via ORAL
  Filled 2018-10-10: qty 4

## 2018-10-10 MED ORDER — FLUTICASONE PROPIONATE 50 MCG/ACT NA SUSP
1.0000 | Freq: Every day | NASAL | Status: DC
  Administered 2018-10-11: 09:00:00 1 via NASAL
  Filled 2018-10-10: qty 16

## 2018-10-10 MED ORDER — NITROGLYCERIN 0.4 MG SL SUBL: 0.4000 mg | SUBLINGUAL_TABLET | SUBLINGUAL | Status: DC | PRN

## 2018-10-10 MED ORDER — RANOLAZINE ER 500 MG PO TB12
1000.0000 mg | ORAL_TABLET | Freq: Every morning | ORAL | Status: DC
  Administered 2018-10-11: 1000 mg via ORAL
  Filled 2018-10-10: qty 2

## 2018-10-10 MED ORDER — PANTOPRAZOLE SODIUM 40 MG PO TBEC
40.0000 mg | DELAYED_RELEASE_TABLET | Freq: Every day | ORAL | Status: DC
  Administered 2018-10-11: 40 mg via ORAL
  Filled 2018-10-10: qty 1

## 2018-10-10 MED ORDER — TRAZODONE HCL 50 MG PO TABS
150.0000 mg | ORAL_TABLET | Freq: Every day | ORAL | Status: DC
  Administered 2018-10-10: 150 mg via ORAL
  Filled 2018-10-10: qty 3

## 2018-10-10 MED ORDER — ATORVASTATIN CALCIUM 80 MG PO TABS
80.0000 mg | ORAL_TABLET | Freq: Every day | ORAL | Status: DC
  Administered 2018-10-10: 80 mg via ORAL
  Filled 2018-10-10: qty 1

## 2018-10-10 MED ORDER — VITAMIN D3 25 MCG (1000 UNIT) PO TABS
1000.0000 [IU] | ORAL_TABLET | Freq: Every day | ORAL | Status: DC
  Administered 2018-10-11: 1000 [IU] via ORAL
  Filled 2018-10-10 (×2): qty 1

## 2018-10-10 MED ORDER — ALBUTEROL SULFATE HFA 108 (90 BASE) MCG/ACT IN AERS: 2.0000 | INHALATION_SPRAY | Freq: Four times a day (QID) | RESPIRATORY_TRACT | Status: DC | PRN

## 2018-10-10 MED ORDER — ALBUTEROL SULFATE (2.5 MG/3ML) 0.083% IN NEBU: 2.5000 mg | INHALATION_SOLUTION | Freq: Four times a day (QID) | RESPIRATORY_TRACT | Status: DC | PRN

## 2018-10-10 MED ORDER — SODIUM CHLORIDE 0.9% FLUSH
3.0000 mL | Freq: Once | INTRAVENOUS | Status: AC
  Administered 2018-10-10: 3 mL via INTRAVENOUS

## 2018-10-10 MED ORDER — METOPROLOL SUCCINATE ER 25 MG PO TB24
25.0000 mg | ORAL_TABLET | Freq: Every day | ORAL | Status: DC
  Administered 2018-10-11: 25 mg via ORAL
  Filled 2018-10-10: qty 1

## 2018-10-10 MED ORDER — OMEGA-3-ACID ETHYL ESTERS 1 G PO CAPS
1.0000 g | ORAL_CAPSULE | Freq: Every day | ORAL | Status: DC
  Administered 2018-10-11: 1 g via ORAL
  Filled 2018-10-10: qty 1

## 2018-10-10 MED ORDER — ADULT MULTIVITAMIN W/MINERALS CH
1.0000 | ORAL_TABLET | Freq: Every day | ORAL | Status: DC
  Administered 2018-10-11: 1 via ORAL
  Filled 2018-10-10: qty 1

## 2018-10-10 MED ORDER — ALLOPURINOL 100 MG PO TABS
100.0000 mg | ORAL_TABLET | Freq: Every day | ORAL | Status: DC
  Filled 2018-10-10 (×2): qty 1

## 2018-10-10 MED ORDER — MONTELUKAST SODIUM 10 MG PO TABS
10.0000 mg | ORAL_TABLET | Freq: Every day | ORAL | Status: DC
  Administered 2018-10-10: 10 mg via ORAL
  Filled 2018-10-10: qty 1

## 2018-10-10 MED ORDER — FENTANYL CITRATE (PF) 100 MCG/2ML IJ SOLN
50.0000 ug | Freq: Once | INTRAMUSCULAR | Status: AC
  Administered 2018-10-10: 18:00:00 50 ug via INTRAVENOUS
  Filled 2018-10-10: qty 2

## 2018-10-10 MED ORDER — TAMSULOSIN HCL 0.4 MG PO CAPS: 0.4000 mg | ORAL_CAPSULE | Freq: Every day | ORAL | Status: DC

## 2018-10-10 MED ORDER — LOPERAMIDE HCL 2 MG PO CAPS: 2.0000 mg | ORAL_CAPSULE | ORAL | Status: DC | PRN

## 2018-10-10 MED ORDER — GABAPENTIN 300 MG PO CAPS
300.0000 mg | ORAL_CAPSULE | Freq: Two times a day (BID) | ORAL | Status: DC
  Administered 2018-10-10 – 2018-10-11 (×2): 300 mg via ORAL
  Filled 2018-10-10 (×2): qty 1

## 2018-10-10 MED ORDER — OXYCODONE HCL 5 MG PO TABS
5.0000 mg | ORAL_TABLET | Freq: Four times a day (QID) | ORAL | Status: DC | PRN
  Administered 2018-10-10: 5 mg via ORAL
  Filled 2018-10-10: qty 1

## 2018-10-10 MED ORDER — HEPARIN BOLUS VIA INFUSION
4000.0000 [IU] | Freq: Once | INTRAVENOUS | Status: AC
  Administered 2018-10-10: 4000 [IU] via INTRAVENOUS
  Filled 2018-10-10: qty 4000

## 2018-10-10 MED ORDER — CETIRIZINE HCL 10 MG PO TABS
5.0000 mg | ORAL_TABLET | Freq: Every day | ORAL | Status: DC
  Administered 2018-10-11: 5 mg via ORAL
  Filled 2018-10-10: qty 1

## 2018-10-10 MED ORDER — HEPARIN (PORCINE) 25000 UT/250ML-% IV SOLN
1050.0000 [IU]/h | INTRAVENOUS | Status: DC
  Administered 2018-10-10: 900 [IU]/h via INTRAVENOUS
  Filled 2018-10-10: qty 250

## 2018-10-10 NOTE — Progress Notes (Signed)
Family Meeting Note  Advance Directive:no  Today a meeting took place with the Patient.  The following clinical team members were present during this meeting:MD  The following were discussed:Patient's diagnosis: NSTEMI,Htn, Hyperlipidemia, Patient's progosis: Unable to determine and Goals for treatment: DNR  Patient made it clear that in any adverse event his son would be power of attorney.  He does not want to be artificially revived if his heart stops he just want to go naturally and does not want to have CPR, defibrillator or ventilatory support.  I encouraged him to discuss this wishes with his family to let them know.  Additional follow-up to be provided: Cardiology  Time spent during discussion:20 minutes  Bruce Basta, MD

## 2018-10-10 NOTE — Consult Note (Signed)
[place] for heparin Indication: chest pain/ACS  Allergies  Allergen Reactions  . Adhesive [Tape] Other (See Comments)    After right leg fracture surgery, pt developed a large blister where tape was applied to his right leg. OK to use paper tape.  . Imdur [Isosorbide Dinitrate] Other (See Comments)    hallucinations  . Singulair [Montelukast Sodium] Other (See Comments)    Hallucinations     Patient Measurements: Height: 6\' 1"  (185.4 cm) Weight: 168 lb (76.2 kg) IBW/kg (Calculated) : 79.9 Heparin Dosing Weight: 76.2 kg  Vital Signs: Temp: 96.8 F (36 C) (08/06 1531) Temp Source: Temporal (08/06 1531) BP: 181/112 (08/06 1730) Pulse Rate: 73 (08/06 1730)  Labs: Recent Labs    10/10/18 1325 10/10/18 1602  HGB 14.5  --   HCT 43.0  --   PLT 209  --   CREATININE 1.04  --   TROPONINIHS 40* 243*    Estimated Creatinine Clearance: 70.2 mL/min (by C-G formula based on SCr of 1.04 mg/dL).   Medical History: Past Medical History:  Diagnosis Date  . Anginal pain ([place])    Left side if chest ,NTG  relieves chaes apin 12/01/13  . Anxiety   . Arthritis   . Cancer [facility])    colon cancer  . CHF (congestive heart failure) ([place])   . 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 Complication of anesthesia    wake up with a head ache  . COPD (chronic obstructive pulmonary disease) ([place])   . Coronary artery disease   . GERD (gastroesophageal reflux disease)   . Headache    related to sinus congestion  . History of blood transfusion   . History of kidney stones   . Hypertension   . Myocardial infarction [facility])    '09 AND '12  . Peripheral vascular disease ([place])   . Shortness of breath    With exertion .  Marland Kitchen UTI (urinary tract infection)    frequent UTI    Medications:  (Not in a hospital admission)  Scheduled:  Infusions:  PRN:  Anti-infectives (From admission, onward)   None       Assessment: Pharmacy consulted to start heparin for ACS. Pt has rivaroxaban listed on his med rec. Will use aPTT for monitoring. Baseline labs ordered.   Goal of Therapy:  aPTT 66-102 seconds Monitor platelets by anticoagulation protocol: Yes   Plan:  Give 4000 units bolus x 1 Start heparin infusion at 900 units/hr Check aPTT level in 8 hours and daily while on heparin Continue to monitor H&H and platelets  Oswald Hillock 10/10/2018,6:17 PM

## 2018-10-10 NOTE — H&P (Signed)
[place] [facility] [place] NAME: Bruce [name]    MR#:  [id number]  DATE OF BIRTH:  1946-02-10  DATE OF ADMISSION:  10/10/2018  PRIMARY CARE PHYSICIAN: Danelle Berry, NP   REQUESTING/REFERRING PHYSICIAN: Mcshane  CHIEF COMPLAINT:   Chief Complaint  Patient presents with  . Chest Pain  . Shortness of Breath    HISTORY OF PRESENT ILLNESS: Bruce [name]  is a 73 y.o. male with a known history of anginal chest pain, anxiety, arthritis, colon cancer, CHF, COPD, coronary artery disease, kidney stone, hypertension, peripheral vascular disease, UTI-he is taking Xarelto every day for DVT- had chest pain in central area radiation to left shoulder and arm. No associated activities with that. He was just sitting this morning when pain started.  He denies SOB or cough. The pain still persisted in ER.  His troponin has jumped from 40-240 in  2 hours in ER, so ER physician spoke to his primary cardiologist Dr. Thelma Barge suggested to start on heparin drip and admit to hospitalist service. Patient has history of coronary artery disease and as per him he had cath done by Dr. Humphrey Rolls but at that time the stents were not placed as he did not had major blockages.  This was in 2009.  He was on aspirin initially but since he was diagnosed with blood clot and started on Xarelto Dr. Yancey Flemings told him to stop taking aspirin.  PAST MEDICAL HISTORY:   Past Medical History:  Diagnosis Date  . Anginal pain ([place])    Left side if chest ,NTG  relieves chaes apin 12/01/13  . Anxiety   . Arthritis   . Cancer [facility])    colon cancer  . CHF (congestive heart failure) ([place])   . 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 Complication of anesthesia    wake up with a head ache  . COPD (chronic obstructive pulmonary disease) ([place])   . Coronary artery disease   . GERD (gastroesophageal reflux disease)   . Headache    related to sinus congestion  .  History of blood transfusion   . History of kidney stones   . Hypertension   . Myocardial infarction [facility])    '09 AND '12  . Peripheral vascular disease ([place])   . Shortness of breath    With exertion .  Marland Kitchen UTI (urinary tract infection)    frequent UTI    PAST SURGICAL HISTORY:  Past Surgical History:  Procedure Laterality Date  . CARDIAC CATHETERIZATION    . CHOLECYSTECTOMY    . EXTERNAL FIXATION LEG Right 10/06/2013   Procedure: CLOSED REDUCTION RIGHT TIBIAL PLATEAU FRACTURE, EXTERNAL FIXATION RIGHT LEG, PLACEMENT OF WOUND VAC;  Surgeon: Rozanna Box, MD;  Location: [place];  Service: Orthopedics;  Laterality: Right;  . FEMORAL-POPLITEAL BYPASS GRAFT Right 10/06/2013   Procedure: RIGHT POPLITEAL-POPLITEAL ARTERY BYPASS GRAFT;  Surgeon: Rosetta Posner, MD;  Location: [place];  Service: Vascular;  Laterality: Right;  . FRACTURE SURGERY Right 2014  . HARDWARE REMOVAL Right 12/02/2013   Procedure: REMOVAL EXTERNAL FIXATION RIGHT LEG ;  Surgeon: Rozanna Box, MD;  Location: [place];  Service: Orthopedics;  Laterality: Right;  . HERNIA REPAIR Right 1990's  . I&D EXTREMITY Right 10/09/2013   Procedure: IRRIGATION AND DEBRIDEMENT RIGHT LEG, CLOSURE  OF WOUNDS, PLACEMENT OF WOUND VAC ON EACH SIDE OF LEG;  Surgeon: Rozanna Box, MD;  Location: [place];  Service:  Orthopedics;  Laterality: Right;  . IR NEPHROSTOMY PLACEMENT LEFT  06/19/2016  . IVC filter  2009   placed @ [facility]/ Removed in 2010.  . IVC Filter Removed    . ligament leg Left   . NEPHROLITHOTOMY Left 06/19/2016   Procedure: NEPHROLITHOTOMY PERCUTANEOUS;  Surgeon: Hollice Espy, MD;  Location: [facility];  Service: Urology;  Laterality: Left;  . ORIF TIBIA FRACTURE Right 12/29/2014   Procedure: OPEN REDUCTION INTERNAL FIXATION (ORIF) RIGHT TIBIA FRACTURE, RIA VS ICBG;  Surgeon: Altamese Layton, MD;  Location: [place];  Service: Orthopedics;  Laterality: Right;  . RADIOACTIVE SEED IMPLANT N/A 09/12/2016   Procedure: RADIOACTIVE SEED  IMPLANT/BRACHYTHERAPY IMPLANT;  Surgeon: Hollice Espy, MD;  Location: [facility];  Service: Urology;  Laterality: N/A;    SOCIAL HISTORY:  Social History   Tobacco Use  . Smoking status: Current Some Day Smoker    Packs/day: 0.50    Years: 52.00    Pack years: 26.00  . Smokeless tobacco: Never Used  Substance Use Topics  . Alcohol use: No    Alcohol/week: 0.0 standard drinks    Comment: Stopped 2009    FAMILY HISTORY:  Family History  Problem Relation Age of Onset  . Hypertension Mother   . Prostate cancer Brother   . Mental illness Neg Hx     DRUG ALLERGIES:  Allergies  Allergen Reactions  . Adhesive [Tape] Other (See Comments)    After right leg fracture surgery, pt developed a large blister where tape was applied to his right leg. OK to use paper tape.  . Imdur [Isosorbide Dinitrate] Other (See Comments)    hallucinations  . Singulair [Montelukast Sodium] Other (See Comments)    Hallucinations     REVIEW OF SYSTEMS:   CONSTITUTIONAL: No fever, fatigue or weakness.  EYES: No blurred or double vision.  EARS, NOSE, AND THROAT: No tinnitus or ear pain.  RESPIRATORY: No cough, shortness of breath, wheezing or hemoptysis.  CARDIOVASCULAR: He have chest pain, no orthopnea, edema.  GASTROINTESTINAL: No nausea, vomiting, diarrhea or abdominal pain.  GENITOURINARY: No dysuria, hematuria.  ENDOCRINE: No polyuria, nocturia,  HEMATOLOGY: No anemia, easy bruising or bleeding SKIN: No rash or lesion. MUSCULOSKELETAL: No joint pain or arthritis.   NEUROLOGIC: No tingling, numbness, weakness.  PSYCHIATRY: No anxiety or depression.   MEDICATIONS AT HOME:  Prior to Admission medications   Medication Sig Start Date End Date Taking? Authorizing Provider  allopurinol (ZYLOPRIM) 100 MG tablet Take 100 mg by mouth at bedtime.   Yes [provider]  atorvastatin (LIPITOR) 80 MG tablet Take 1 tablet (80 mg total) by mouth at bedtime for 30 days. 04/30/18 10/10/18 Yes  Pyreddy, Reatha Harps, MD  benazepril (LOTENSIN) 20 MG tablet Take 20 mg by mouth daily.  08/08/16  Yes [provider]  Cetirizine HCl (ALL DAY ALLERGY PO) Take 1 tablet by mouth daily.   Yes [provider]  cholecalciferol (VITAMIN D) 1000 units tablet Take 1,000 Units by mouth daily.    Yes [provider]  furosemide (LASIX) 20 MG tablet Take 20 mg by mouth daily.   Yes [provider]  losartan (COZAAR) 50 MG tablet Take 50 mg by mouth daily.   Yes [provider]  metoprolol succinate (TOPROL-XL) 25 MG 24 hr tablet Take 25 mg by mouth daily. 05/17/18  Yes [provider]  montelukast (SINGULAIR) 10 MG tablet Take 10 mg by mouth at bedtime.  10/05/16  Yes [provider]  Multiple Vitamin (MULTIVITAMIN)  tablet Take 1 tablet by mouth daily. For Men   Yes [provider]  Omega-3 Fatty Acids (FISH OIL PO) Take 1,000 mg by mouth daily.    Yes [provider]  omeprazole (PRILOSEC) 40 MG capsule Take 40 mg by mouth daily.   Yes [provider]  potassium chloride (K-DUR) 10 MEQ tablet Take 10 mEq by mouth daily.  08/08/16  Yes [provider]  Probiotic Product (PROBIOTIC FORMULA PO) Take 1 capsule by mouth at bedtime.    Yes [provider]  ranolazine (RANEXA) 1000 MG SR tablet Take 1,000 mg by mouth every morning.    Yes [provider]  rivaroxaban (XARELTO) 20 MG TABS tablet Take 20 mg by mouth daily with supper.   Yes [provider]  tamsulosin (FLOMAX) 0.4 MG CAPS capsule TAKE ONE CAPSULE BY MOUTH ONCE DAILY AFTER SUPPER Patient taking differently: Take 0.4 mg by mouth daily after supper.  04/05/18  Yes Chrystal, Eulas Post, MD  traZODone (DESYREL) 150 MG tablet Take 1 tablet (150 mg total) by mouth at bedtime. 02/05/17  Yes Ursula Alert, MD  albuterol (PROVENTIL HFA;VENTOLIN HFA) 108 (90 Base) MCG/ACT inhaler Inhale 2 puffs into the lungs every 6 (six) hours as needed for wheezing  or shortness of breath. 04/30/18   Pyreddy, Reatha Harps, MD  fluticasone (FLONASE) 50 MCG/ACT nasal spray Place 1 spray into both nostrils daily for 30 days. 04/30/18 05/30/18  Saundra Shelling, MD  gabapentin (NEURONTIN) 300 MG capsule  01/15/17   [provider]  loperamide (IMODIUM) 2 MG capsule Take by mouth as needed for diarrhea or loose stools.    [provider]  mirtazapine (REMERON) 15 MG tablet Take 1 tablet (15 mg total) by mouth at bedtime for 30 days. 04/30/18 05/30/18  Saundra Shelling, MD  nitroGLYCERIN (NITROSTAT) 0.4 MG SL tablet Place 0.4 mg under the tongue every 5 (five) minutes as needed for chest pain.     [provider]      PHYSICAL EXAMINATION:   VITAL SIGNS: Blood pressure (!) 167/95, pulse 64, temperature (!) 96.8 F (36 C), temperature source Temporal, resp. rate 18, height 6\' 1"  (1.854 m), weight 76.2 kg, SpO2 99 %.  GENERAL:  73 y.o.-year-old patient lying in the bed with no acute distress.  EYES: Pupils equal, round, reactive to light and accommodation. No scleral icterus. Extraocular muscles intact.  HEENT: Head atraumatic, normocephalic. Oropharynx and nasopharynx clear.  NECK:  Supple, no jugular venous distention. No thyroid enlargement, no tenderness.  LUNGS: Normal breath sounds bilaterally, no wheezing, rales,rhonchi or crepitation. No use of accessory muscles of respiration.  CARDIOVASCULAR: S1, S2 normal. No murmurs, rubs, or gallops.  ABDOMEN: Soft, nontender, nondistended. Bowel sounds present. No organomegaly or mass.  EXTREMITIES: No pedal edema, cyanosis, or clubbing.  NEUROLOGIC: Cranial nerves II through XII are intact. Muscle strength 5/5 in all extremities. Sensation intact. Gait not checked.  PSYCHIATRIC: The patient is alert and oriented x 3.  SKIN: No obvious rash, lesion, or ulcer.   LABORATORY PANEL:   CBC Recent Labs  Lab 10/10/18 1325  WBC 7.0  HGB 14.5  HCT 43.0  PLT 209  MCV 97.5  MCH 32.9  MCHC 33.7  RDW  12.0   ------------------------------------------------------------------------------------------------------------------  Chemistries  Recent Labs  Lab 10/10/18 1325  NA 139  K 4.2  CL 107  CO2 23  GLUCOSE 116*  BUN 13  CREATININE 1.04  CALCIUM 9.5   ------------------------------------------------------------------------------------------------------------------ estimated creatinine clearance is 70.2 mL/min (by  C-G formula based on SCr of 1.04 mg/dL). ------------------------------------------------------------------------------------------------------------------ No results for input(s): TSH, T4TOTAL, T3FREE, THYROIDAB in the last 72 hours.  Invalid input(s): FREET3   Coagulation profile Recent Labs  Lab 10/10/18 1833  INR 1.0   ------------------------------------------------------------------------------------------------------------------- No results for input(s): DDIMER in the last 72 hours. -------------------------------------------------------------------------------------------------------------------  Cardiac Enzymes No results for input(s): CKMB, TROPONINI, MYOGLOBIN in the last 168 hours.  Invalid input(s): CK ------------------------------------------------------------------------------------------------------------------ Invalid input(s): POCBNP  ---------------------------------------------------------------------------------------------------------------  Urinalysis    Component Value Date/Time   COLORURINE YELLOW (A) 04/28/2018 2039   APPEARANCEUR CLEAR (A) 04/28/2018 2039   APPEARANCEUR Clear 04/10/2016 1035   LABSPEC 1.005 04/28/2018 2039   PHURINE 6.0 04/28/2018 2039   GLUCOSEU NEGATIVE 04/28/2018 2039   HGBUR NEGATIVE 04/28/2018 2039   BILIRUBINUR NEGATIVE 04/28/2018 2039   BILIRUBINUR Negative 04/10/2016 1035   KETONESUR NEGATIVE 04/28/2018 2039   PROTEINUR NEGATIVE 04/28/2018 2039   NITRITE NEGATIVE 04/28/2018 2039   LEUKOCYTESUR  NEGATIVE 04/28/2018 2039     RADIOLOGY: Dg Chest 2 View  Result Date: 10/10/2018 CLINICAL DATA:  Chest pain EXAM: CHEST - 2 VIEW COMPARISON:  March 09, 2015. FINDINGS: The lungs are clear without focal consolidation, edema, effusion or pneumothorax. Cardiomediastinal silhouette is within normal limits for size. No acute osseous findings. IMPRESSION: No acute cardiopulmonary process. Electronically Signed   By: Prudencio Pair M.D.   On: 10/10/2018 13:42    EKG: Orders placed or performed during the hospital encounter of 10/10/18  . EKG 12-Lead  . EKG 12-Lead  . ED EKG  . ED EKG    IMPRESSION AND PLAN:  *Non-STEMI Troponin is significant rise. Patient has positive history of CAD Started on heparin drip as advised by cardiologist. Continue on metoprolol, atorvastatin.  He is not on aspirin as mentioned above because he was on Xarelto. I will hold benazepril and Lasix currently as most likely tomorrow he would need cardiac catheterization. I have ordered echocardiogram. Cardiologist Dr. Humphrey Rolls is aware.  *Hypertension Continue metoprolol, losartan, Ranexa Hold benazepril and Lasix.  *Hyperlipidemia Continue atorvastatin and omega-3 fatty acids.  *History of DVT Patient is on Xarelto, currently held because of heparin drip.  *COPD Currently no exacerbation symptoms I would continue his inhalers.  *Active smoking Counseled to quit smoking for 4 minutes and offered nicotine patch.   All the records are reviewed and case discussed with ED provider. Management plans discussed with the patient, family and they are in agreement.  CODE STATUS: DNR Code Status History    Date Active Date Inactive Code Status Order ID Comments User Context   04/28/2018 2257 04/30/2018 1633 Full Code [id number]  Lance Coon, MD Inpatient   06/19/2016 1643 06/20/2016 2153 Full Code [id number]  Hollice Espy, MD Inpatient   12/29/2014 2357 01/01/2015 1731 Full Code [id number]  Danne Baxter  Inpatient   10/06/2013 1929 10/11/2013 1800 Full Code [id number]  Lisette Abu, PA-C Inpatient   Advance Care Planning Activity       TOTAL TIME TAKING CARE OF THIS PATIENT: 50 minutes.    Vaughan Basta M.D on 10/10/2018   Between 7am to 6pm - Pager - [phone]  After 6pm go to [email or web address] - password [email or web address] [facility]  Office  [phone]  CC: Primary care physician; Danelle Berry, NP   Note: This dictation was prepared with Dragon dictation along with smaller phrase technology. Any transcriptional errors that result from this process are unintentional.

## 2018-10-10 NOTE — ED Triage Notes (Signed)
Pt c/o left sided chest pain with left arm tingling that started about 2hrs PTA, pt states he has had increased SOB over the past 2 days. 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

## 2018-10-10 NOTE — ED Provider Notes (Addendum)
[facility] Emergency Department Provider Note  ____________________________________________   I have reviewed the triage vital signs and the nursing notes. Where available I have reviewed prior notes and, if possible and indicated, outside hospital notes.   Patient seen and evaluated during the coronavirus epidemic during a time with low staffing  Patient seen for the symptoms described in the history of present illness. he was evaluated in the context of the global COVID-19 pandemic, which necessitated consideration that the patient might be at risk for infection with the SARS-CoV-2 virus that causes COVID-19. Institutional protocols and algorithms that pertain to the evaluation of patients at risk for COVID-19 are in a state of rapid change based on information released by regulatory bodies including the CDC and federal and state organizations. These policies and algorithms were followed during the patient's care in the ED.    HISTORY  Chief Complaint Chest Pain and Shortness of Breath    HPI Bruce [name] is a 73 y.o. male history of CAD, having had stents he states placed in 2009 2012 presents today after having gradual onset left-sided pressure rating to left arm associated with diaphoresis and feeling sweaty, he states that this has largely resolved his pain is maybe a 1 out of 10 right now, and he no longer feels shortness of breath or diaphoretic.  He did not have any pleuritic chest pain.  Is on Xarelto he states, he did take it today.  He denies any fever or chills.  No nausea no vomiting.  No leg swelling no pleuritic pain.    States this was exactly like his normal angina, he was able to drive himself in here, and after 4 hours the pain is now he states "about gone" no other antecedent intervention or prior treatment noted.   Past Medical History:  Diagnosis Date  . Anginal pain ([place])    Left side if chest ,NTG  relieves chaes apin 12/01/13  . Anxiety    . Arthritis   . Cancer [facility])    colon cancer  . CHF (congestive heart failure) ([place])   . 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 Complication of anesthesia    wake up with a head ache  . COPD (chronic obstructive pulmonary disease) ([place])   . Coronary artery disease   . GERD (gastroesophageal reflux disease)   . Headache    related to sinus congestion  . History of blood transfusion   . History of kidney stones   . Hypertension   . Myocardial infarction [facility])    '09 AND '12  . Peripheral vascular disease ([place])   . Shortness of breath    With exertion .  Marland Kitchen UTI (urinary tract infection)    frequent UTI    Patient Active Problem List   Diagnosis Date Noted  . Recurrent syncope 04/28/2018  . Malignant neoplasm of prostate ([place]) 07/13/2016  . 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 Visual hallucination 03/18/2015  . Auditory hallucination 03/18/2015  . 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 Tibial plateau fracture 12/29/2014  . Limb ischemia 11/18/2013  . Blunt trauma of lower leg 10/08/2013  . 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 Acute blood loss anemia 10/08/2013  . Anxiety disorder 10/08/2013  . Dyslipidemia 10/08/2013  . GERD (gastroesophageal reflux disease) 10/08/2013  . History of MI (myocardial infarction) 10/08/2013  . Anticoagulated 10/08/2013  . Hypertension   . Tibia/fibula  fracture 10/06/2013  . CAD (coronary artery disease) 08/16/2010  . Edema 08/16/2010  . COPD (chronic obstructive pulmonary disease) ([place]) 08/16/2010  . DVT of leg (deep venous thrombosis) ([place]) 08/16/2010  . Leg cramps 08/16/2010    Past Surgical History:  Procedure Laterality Date  . CARDIAC CATHETERIZATION    . CHOLECYSTECTOMY    . EXTERNAL FIXATION LEG Right 10/06/2013   Procedure: CLOSED REDUCTION RIGHT TIBIAL PLATEAU FRACTURE, EXTERNAL FIXATION RIGHT LEG, PLACEMENT OF WOUND VAC;   Surgeon: Rozanna Box, MD;  Location: [place];  Service: Orthopedics;  Laterality: Right;  . FEMORAL-POPLITEAL BYPASS GRAFT Right 10/06/2013   Procedure: RIGHT POPLITEAL-POPLITEAL ARTERY BYPASS GRAFT;  Surgeon: Rosetta Posner, MD;  Location: [place];  Service: Vascular;  Laterality: Right;  . FRACTURE SURGERY Right 2014  . HARDWARE REMOVAL Right 12/02/2013   Procedure: REMOVAL EXTERNAL FIXATION RIGHT LEG ;  Surgeon: Rozanna Box, MD;  Location: [place];  Service: Orthopedics;  Laterality: Right;  . HERNIA REPAIR Right 1990's  . I&D EXTREMITY Right 10/09/2013   Procedure: IRRIGATION AND DEBRIDEMENT RIGHT LEG, CLOSURE  OF WOUNDS, PLACEMENT OF WOUND VAC ON EACH SIDE OF LEG;  Surgeon: Rozanna Box, MD;  Location: [place];  Service: Orthopedics;  Laterality: Right;  . IR NEPHROSTOMY PLACEMENT LEFT  06/19/2016  . IVC filter  2009   placed @ [facility]/ Removed in 2010.  . IVC Filter Removed    . ligament leg Left   . NEPHROLITHOTOMY Left 06/19/2016   Procedure: NEPHROLITHOTOMY PERCUTANEOUS;  Surgeon: Hollice Espy, MD;  Location: [facility];  Service: Urology;  Laterality: Left;  . ORIF TIBIA FRACTURE Right 12/29/2014   Procedure: OPEN REDUCTION INTERNAL FIXATION (ORIF) RIGHT TIBIA FRACTURE, RIA VS ICBG;  Surgeon: Altamese Ponemah, MD;  Location: [place];  Service: Orthopedics;  Laterality: Right;  . RADIOACTIVE SEED IMPLANT N/A 09/12/2016   Procedure: RADIOACTIVE SEED IMPLANT/BRACHYTHERAPY IMPLANT;  Surgeon: Hollice Espy, MD;  Location: [facility];  Service: Urology;  Laterality: N/A;    Prior to Admission medications   Medication Sig Start Date End Date Taking? Authorizing Provider  albuterol (PROVENTIL HFA;VENTOLIN HFA) 108 (90 Base) MCG/ACT inhaler Inhale 2 puffs into the lungs every 6 (six) hours as needed for wheezing or shortness of breath. 04/30/18   Saundra Shelling, MD  allopurinol (ZYLOPRIM) 100 MG tablet Take 100 mg by mouth at bedtime.    [provider]  atorvastatin (LIPITOR) 80 MG tablet Take 1  tablet (80 mg total) by mouth at bedtime for 30 days. 04/30/18 05/30/18  Saundra Shelling, MD  benazepril (LOTENSIN) 20 MG tablet Take 20 mg by mouth daily.  08/08/16   [provider]  Cetirizine HCl (ALL DAY ALLERGY PO) Take 1 tablet by mouth daily.    [provider]  cholecalciferol (VITAMIN D) 1000 units tablet Take 1,000 Units by mouth daily.     [provider]  fluticasone (FLONASE) 50 MCG/ACT nasal spray Place 1 spray into both nostrils daily for 30 days. 04/30/18 05/30/18  Saundra Shelling, MD  furosemide (LASIX) 20 MG tablet Take 20 mg by mouth daily.    [provider]  gabapentin (NEURONTIN) 300 MG capsule  01/15/17   [provider]  loperamide (IMODIUM) 2 MG capsule Take by mouth as needed for diarrhea or loose stools.    [provider]  losartan (COZAAR) 50 MG tablet Take 50 mg by mouth daily.    [provider]  metoprolol succinate (TOPROL-XL) 50 MG 24 hr tablet Take  1 tablet (50 mg total) by mouth daily. Patient not taking: Reported on 04/29/2018 04/12/11   Jackolyn Confer, MD  mirtazapine (REMERON) 15 MG tablet Take 1 tablet (15 mg total) by mouth at bedtime for 30 days. 04/30/18 05/30/18  Saundra Shelling, MD  montelukast (SINGULAIR) 10 MG tablet  10/05/16   [provider]  Multiple Vitamin (MULTIVITAMIN) tablet Take 1 tablet by mouth daily. For Men    [provider]  nitroGLYCERIN (NITROSTAT) 0.4 MG SL tablet Place 0.4 mg under the tongue every 5 (five) minutes as needed for chest pain.     [provider]  Omega-3 Fatty Acids (FISH OIL PO) Take 1,000 mg by mouth daily.     [provider]  omeprazole (PRILOSEC) 40 MG capsule Take 40 mg by mouth daily.    [provider]  potassium chloride (K-DUR) 10 MEQ tablet Take 10 mEq by mouth daily.  08/08/16   [provider]  Probiotic Product (PROBIOTIC FORMULA PO) Take 1 capsule by mouth at bedtime.     [provider]   ranolazine (RANEXA) 1000 MG SR tablet Take 1,000 mg by mouth every morning.     [provider]  rivaroxaban (XARELTO) 20 MG TABS tablet Take 20 mg by mouth daily with supper.    [provider]  tamsulosin (FLOMAX) 0.4 MG CAPS capsule TAKE ONE CAPSULE BY MOUTH ONCE DAILY AFTER SUPPER 04/05/18   Noreene Filbert, MD  traZODone (DESYREL) 150 MG tablet Take 1 tablet (150 mg total) by mouth at bedtime. 02/05/17   Ursula Alert, MD    Allergies Adhesive [tape], Imdur [isosorbide dinitrate], and Singulair [montelukast sodium]  Family History  Problem Relation Age of Onset  . Hypertension Mother   . Prostate cancer Brother   . Mental illness Neg Hx     Social History Social History   Tobacco Use  . Smoking status: Current Some Day Smoker    Packs/day: 0.50    Years: 52.00    Pack years: 26.00  . Smokeless tobacco: Never Used  Substance Use Topics  . Alcohol use: No    Alcohol/week: 0.0 standard drinks    Comment: Stopped 2009  . Drug use: No    Review of Systems Constitutional: No fever/chills Eyes: No visual changes. ENT: No sore throat. No stiff neck no neck pain Cardiovascular: chest pain. Respiratory: shortness of breath. Gastrointestinal:   no vomiting.  No diarrhea.  No constipation. Genitourinary: Negative for dysuria. Musculoskeletal: Negative lower extremity swelling Skin: Negative for rash. Neurological: Negative for severe headaches, focal weakness or numbness.   ____________________________________________   PHYSICAL EXAM:  VITAL SIGNS: ED Triage Vitals [10/10/18 1315]  Enc Vitals Group     BP (!) 164/88     Pulse Rate 70     Resp 18     Temp 97.6 F (36.4 C)     Temp Source Oral     SpO2 97 %     Weight 168 lb (76.2 kg)     Height 6\' 1"  (1.854 m)     Head Circumference      Peak Flow      Pain Score 7     Pain Loc      Pain Edu?      Excl. in [place]?     Constitutional: Alert and oriented. Well appearing and in no acute  distress. Eyes: Conjunctivae are normal Head: Atraumatic HEENT: No congestion/rhinnorhea. Mucous membranes are moist.  Oropharynx non-erythematous Neck:  Nontender with no meningismus, no masses, no stridor Cardiovascular: Normal rate, regular rhythm. Grossly normal heart sounds.  Good peripheral circulation. Respiratory: Normal respiratory effort.  No retractions. Lungs CTAB. Abdominal: Soft and nontender. No distention. No guarding no rebound Back:  There is no focal tenderness or step off.  there is no midline tenderness there are no lesions noted. there is no CVA tenderness Musculoskeletal: No lower extremity tenderness, no upper extremity tenderness. No joint effusions, no DVT signs strong distal pulses no edema Neurologic:  Normal speech and language. No gross focal neurologic deficits are appreciated.  Skin:  Skin is warm, dry and intact. No rash noted. Psychiatric: Mood and affect are normal. Speech and behavior are normal.  ____________________________________________   LABS (all labs ordered are listed, but only abnormal results are displayed)  Labs Reviewed  BASIC METABOLIC PANEL - Abnormal; Notable for the following components:      Result Value   Glucose, Bld 116 (*)    All other components within normal limits  TROPONIN I (HIGH SENSITIVITY) - Abnormal; Notable for the following components:   Troponin I (High Sensitivity) 40 (*)    All other components within normal limits  TROPONIN I (HIGH SENSITIVITY) - Abnormal; Notable for the following components:   Troponin I (High Sensitivity) 243 (*)    All other components within normal limits  SARS CORONAVIRUS 2 (HOSPITAL ORDER, [place] [facility])  CBC    Pertinent labs  results that were available during my care of the patient were reviewed by me and considered in my medical decision making (see chart for details). ____________________________________________  EKG  I personally interpreted any EKGs  ordered by me or triage Normal sinus rhythm, rate 75 bpm no acute ST elevation nonspecific ST changes no acute ischemia noted. Second EKG done at 530 shows no significant change from prior there is still some ST mild changes in aVF but no acute ST elevation.  ____________________________________________  RADIOLOGY  Pertinent labs & imaging results that were available during my care of the patient were reviewed by me and considered in my medical decision making (see chart for details). If possible, patient and/or family made aware of any abnormal findings.  Dg Chest 2 View  Result Date: 10/10/2018 CLINICAL DATA:  Chest pain EXAM: CHEST - 2 VIEW COMPARISON:  March 09, 2015. FINDINGS: The lungs are clear without focal consolidation, edema, effusion or pneumothorax. Cardiomediastinal silhouette is within normal limits for size. No acute osseous findings. IMPRESSION: No acute cardiopulmonary process. Electronically Signed   By: Prudencio Pair M.D.   On: 10/10/2018 13:42   ____________________________________________    PROCEDURES  Procedure(s) performed: None  Procedures  Critical Care performed: CRITICAL CARE Performed by: Schuyler Amor   Total critical care time: 38 minutes  Critical care time was exclusive of separately billable procedures and treating other patients.  Critical care was necessary to treat or prevent imminent or life-threatening deterioration.  Critical care was time spent personally by me on the following activities: development of treatment plan with patient and/or surrogate as well as nursing, discussions with consultants, evaluation of patient's response to treatment, examination of patient, obtaining history from patient or surrogate, ordering and performing treatments and interventions, ordering and review of laboratory studies, ordering and review of radiographic studies, pulse oximetry and re-evaluation of patient's  condition.   ____________________________________________   INITIAL IMPRESSION / ASSESSMENT AND PLAN / ED COURSE  Pertinent labs & imaging results that were available during my care  of the patient were reviewed by me and considered in my medical decision making (see chart for details).   Patient here with his anginal equivalent.  Low suspicion for PE given the very characteristic pain for this gentleman with left arm radiation diaphoresis etc.  His pain is nearly gone we will give him aspirin, he is already on Xarelto, we will see if cardiology wants to start him on heparin.  He is at this point essentially asymptomatic he states.  He will require admission.  ----------------------------------------- 6:11 PM on 10/10/2018 -----------------------------------------  As per the hospitalist agree with management and will admit patient, he is not complaining of pain at this time, I talked to Dr. Humphrey Rolls, patient's cardiologist, who is requesting a heparin drip.  He is aware of the patient's Xarelto.  He advises patient be admitted to the hospitalist who are already admitting the patient.  Appreciate consult.    ____________________________________________   FINAL CLINICAL IMPRESSION(S) / ED DIAGNOSES  Final diagnoses:  None      This chart was dictated using voice recognition software.  Despite best efforts to proofread,  errors can occur which can change meaning.      Schuyler Amor, MD 10/10/18 1739    Schuyler Amor, MD 10/10/18 [phone]

## 2018-10-10 NOTE — ED Notes (Signed)
Pt eating food tray, sitting comfortably in the bed with no complaints

## 2018-10-10 NOTE — ED Notes (Signed)
ED TO INPATIENT HANDOFF REPORT  ED Nurse Name and Phone #: Jolan Upchurch [phone]  S Name/Age/Gender Bruce [name] 73 y.o. male Room/Bed: ED08A/ED08A  Code Status   Code Status: Prior  Home/SNF/Other Home Patient oriented to: self, place, time and situation Is this baseline? Yes   Triage Complete: Triage complete  Chief Complaint left arm numbness  Triage Note Pt c/o left sided chest pain with left arm tingling that started about 2hrs PTA, pt states he has had increased SOB over the past 2 days. 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   Allergies Allergies  Allergen Reactions  . Adhesive [Tape] Other (See Comments)    After right leg fracture surgery, pt developed a large blister where tape was applied to his right leg. OK to use paper tape.  . Imdur [Isosorbide Dinitrate] Other (See Comments)    hallucinations  . Singulair [Montelukast Sodium] Other (See Comments)    Hallucinations     Level of Care/Admitting Diagnosis ED Disposition    ED Disposition Condition Comment   Admit  Hospital Area: [place] [[id number]]  Level of Care: Telemetry [5]  Covid Evaluation: Confirmed COVID Negative  Diagnosis: NSTEMI (non-ST elevated myocardial infarction) [facility]) [[id number]]  Admitting Physician: Vaughan Basta [id number]  Attending Physician: Vaughan Basta [phone]  Estimated length of stay: past midnight tomorrow  Certification:: I certify this patient will need inpatient services for at least 2 midnights  PT Class (Do Not Modify): Inpatient [101]  PT Acc Code (Do Not Modify): Private [1]       B Medical/Surgery History Past Medical History:  Diagnosis Date  . Anginal pain ([place])    Left side if chest ,NTG  relieves chaes apin 12/01/13  . Anxiety   . Arthritis   . Cancer [facility])    colon cancer  . CHF (congestive heart failure) ([place])   . 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  Complication of anesthesia    wake up with a head ache  . COPD (chronic obstructive pulmonary disease) ([place])   . Coronary artery disease   . GERD (gastroesophageal reflux disease)   . Headache    related to sinus congestion  . History of blood transfusion   . History of kidney stones   . Hypertension   . Myocardial infarction [facility])    '09 AND '12  . Peripheral vascular disease ([place])   . Shortness of breath    With exertion .  Marland Kitchen UTI (urinary tract infection)    frequent UTI   Past Surgical History:  Procedure Laterality Date  . CARDIAC CATHETERIZATION    . CHOLECYSTECTOMY    . EXTERNAL FIXATION LEG Right 10/06/2013   Procedure: CLOSED REDUCTION RIGHT TIBIAL PLATEAU FRACTURE, EXTERNAL FIXATION RIGHT LEG, PLACEMENT OF WOUND VAC;  Surgeon: Rozanna Box, MD;  Location: [place];  Service: Orthopedics;  Laterality: Right;  . FEMORAL-POPLITEAL BYPASS GRAFT Right 10/06/2013   Procedure: RIGHT POPLITEAL-POPLITEAL ARTERY BYPASS GRAFT;  Surgeon: Rosetta Posner, MD;  Location: [place];  Service: Vascular;  Laterality: Right;  . FRACTURE SURGERY Right 2014  . HARDWARE REMOVAL Right 12/02/2013   Procedure: REMOVAL EXTERNAL FIXATION RIGHT LEG ;  Surgeon: Rozanna Box, MD;  Location: [place];  Service: Orthopedics;  Laterality: Right;  . HERNIA REPAIR Right 1990's  . I&D EXTREMITY Right 10/09/2013   Procedure: IRRIGATION AND DEBRIDEMENT RIGHT LEG, CLOSURE  OF WOUNDS, PLACEMENT OF WOUND  VAC ON EACH SIDE OF LEG;  Surgeon: Rozanna Box, MD;  Location: [place];  Service: Orthopedics;  Laterality: Right;  . IR NEPHROSTOMY PLACEMENT LEFT  06/19/2016  . IVC filter  2009   placed @ [facility]/ Removed in 2010.  . IVC Filter Removed    . ligament leg Left   . NEPHROLITHOTOMY Left 06/19/2016   Procedure: NEPHROLITHOTOMY PERCUTANEOUS;  Surgeon: Hollice Espy, MD;  Location: [facility];  Service: Urology;  Laterality: Left;  . ORIF TIBIA FRACTURE Right 12/29/2014   Procedure: OPEN REDUCTION INTERNAL FIXATION (ORIF) RIGHT  TIBIA FRACTURE, RIA VS ICBG;  Surgeon: Altamese Notus, MD;  Location: [place];  Service: Orthopedics;  Laterality: Right;  . RADIOACTIVE SEED IMPLANT N/A 09/12/2016   Procedure: RADIOACTIVE SEED IMPLANT/BRACHYTHERAPY IMPLANT;  Surgeon: Hollice Espy, MD;  Location: [facility];  Service: Urology;  Laterality: N/A;     A IV Location/Drains/Wounds Patient Lines/Drains/Airways Status   Active Line/Drains/Airways    Name:   Placement date:   Placement time:   Site:   Days:   Peripheral IV 10/10/18 Right Forearm   10/10/18    1326    Forearm   less than 1   Peripheral IV 10/10/18 Left Forearm   10/10/18    1810    Forearm   less than 1   Incision (Closed) 09/12/16 Perineum   09/12/16    0806     758          Intake/Output Last 24 hours No intake or output data in the 24 hours ending 10/10/18 2030  Labs/Imaging Results for orders placed or performed during the hospital encounter of 10/10/18 (from the past 48 hour(s))  Basic metabolic panel     Status: Abnormal   Collection Time: 10/10/18  1:25 PM  Result Value Ref Range   Sodium 139 135 - 145 mmol/L   Potassium 4.2 3.5 - 5.1 mmol/L   Chloride 107 98 - 111 mmol/L   CO2 23 22 - 32 mmol/L   Glucose, Bld 116 (H) 70 - 99 mg/dL   BUN 13 8 - 23 mg/dL   Creatinine, Ser 1.04 0.61 - 1.24 mg/dL   Calcium 9.5 8.9 - 10.3 mg/dL   GFR calc non Af Amer >60 >60 mL/min   GFR calc Af Amer >60 >60 mL/min   Anion gap 9 5 - 15    Comment: Performed at [facility], [address] [place], [place] [address]  CBC     Status: None   Collection Time: 10/10/18  1:25 PM  Result Value Ref Range   WBC 7.0 4.0 - 10.5 K/uL   RBC 4.41 4.22 - 5.81 MIL/uL   Hemoglobin 14.5 13.0 - 17.0 g/dL   HCT 43.0 39.0 - 52.0 %   MCV 97.5 80.0 - 100.0 fL   MCH 32.9 26.0 - 34.0 pg   MCHC 33.7 30.0 - 36.0 g/dL   RDW 12.0 11.5 - 15.5 %   Platelets 209 150 - 400 K/uL   nRBC 0.0 0.0 - 0.2 %    Comment: Performed at [facility], [place], [place] [address]  Troponin I (High Sensitivity)     Status: Abnormal   Collection Time: 10/10/18  1:25 PM  Result Value Ref Range   Troponin I (High Sensitivity) 40 (H) <18 ng/L    Comment: (NOTE) Elevated high sensitivity troponin I (hsTnI) values and significant  changes across serial measurements may suggest ACS but many other  chronic  and acute conditions are known to elevate hsTnI results.  Refer to the "Links" section for chest pain algorithms and additional  guidance. Performed at [facility], [address] [place], [place] [address]   Troponin I (High Sensitivity)     Status: Abnormal   Collection Time: 10/10/18  4:02 PM  Result Value Ref Range   Troponin I (High Sensitivity) 243 (HH) <18 ng/L    Comment: CRITICAL RESULT CALLED TO, READ BACK BY AND VERIFIED WITH ANNA HOLT @1705  10/10/18 MJU (NOTE) Elevated high sensitivity troponin I (hsTnI) values and significant  changes across serial measurements may suggest ACS but many other  chronic and acute conditions are known to elevate hsTnI results.  Refer to the "Links" section for chest pain algorithms and additional  guidance. Performed at [facility], [address] [place], [place] [address]   SARS Coronavirus 2 [facility] order, Performed in [facility] hospital lab) Nasopharyngeal Nasopharyngeal Swab     Status: None   Collection Time: 10/10/18  6:33 PM   Specimen: Nasopharyngeal Swab  Result Value Ref Range   SARS Coronavirus 2 NEGATIVE NEGATIVE    Comment: (NOTE) If result is NEGATIVE SARS-CoV-2 target nucleic acids are NOT DETECTED. The SARS-CoV-2 RNA is generally detectable in upper and lower  respiratory specimens during the acute phase of infection. The lowest  concentration of SARS-CoV-2 viral copies this assay can detect is 250  copies / mL. A negative result does not preclude SARS-CoV-2 infection  and should not be used as the sole basis for treatment or other  patient management  decisions.  A negative result may occur with  improper specimen collection / handling, submission of specimen other  than nasopharyngeal swab, presence of viral mutation(s) within the  areas targeted by this assay, and inadequate number of viral copies  (<250 copies / mL). A negative result must be combined with clinical  observations, patient history, and epidemiological information. If result is POSITIVE SARS-CoV-2 target nucleic acids are DETECTED. The SARS-CoV-2 RNA is generally detectable in upper and lower  respiratory specimens dur ing the acute phase of infection.  Positive  results are indicative of active infection with SARS-CoV-2.  Clinical  correlation with patient history and other diagnostic information is  necessary to determine patient infection status.  Positive results do  not rule out bacterial infection or co-infection with other viruses. If result is PRESUMPTIVE POSTIVE SARS-CoV-2 nucleic acids MAY BE PRESENT.   A presumptive positive result was obtained on the submitted specimen  and confirmed on repeat testing.  While 2019 novel coronavirus  (SARS-CoV-2) nucleic acids may be present in the submitted sample  additional confirmatory testing may be necessary for epidemiological  and / or clinical management purposes  to differentiate between  SARS-CoV-2 and other Sarbecovirus currently known to infect humans.  If clinically indicated additional testing with an alternate test  methodology [phone]) is advised. The SARS-CoV-2 RNA is generally  detectable in upper and lower respiratory sp ecimens during the acute  phase of infection. The expected result is Negative. Fact Sheet for Patients:  StrictlyIdeas.no Fact Sheet for Healthcare Providers: BankingDealers.co.za This test is not yet approved or cleared by the [place] FDA and has been authorized for detection and/or diagnosis of SARS-CoV-2 by FDA under an  Emergency Use Authorization (EUA).  This EUA will remain in effect (meaning this test can be used) for the duration of the COVID-19 declaration under Section 564(b)(1) of the Act, 21 U.S.C. section 360bbb-3(b)(1), unless the  authorization is terminated or revoked sooner. Performed at [facility], [address] [place], [place] [address]   Protime-INR     Status: None   Collection Time: 10/10/18  6:33 PM  Result Value Ref Range   Prothrombin Time 13.5 11.4 - 15.2 seconds   INR 1.0 0.8 - 1.2    Comment: (NOTE) INR goal varies based on device and disease states. Performed at [facility], [address] [place], [place] [address]   APTT     Status: None   Collection Time: 10/10/18  6:33 PM  Result Value Ref Range   aPTT 29 24 - 36 seconds    Comment: Performed at [facility], [place], [place] [address]  Heparin level (unfractionated)     Status: Abnormal   Collection Time: 10/10/18  6:33 PM  Result Value Ref Range   Heparin Unfractionated <0.10 (L) 0.30 - 0.70 IU/mL    Comment: (NOTE) If heparin results are below expected values, and patient dosage has  been confirmed, suggest follow up testing of antithrombin III levels. Performed at [facility], [place], [place] [address]   Troponin I (High Sensitivity)     Status: Abnormal   Collection Time: 10/10/18  7:34 PM  Result Value Ref Range   Troponin I (High Sensitivity) 765 (HH) <18 ng/L    Comment: READ BACK AND VERIFIED WITH Dallon Dacosta @2022  10/10/18 MJU (NOTE) Elevated high sensitivity troponin I (hsTnI) values and significant  changes across serial measurements may suggest ACS but many other  chronic and acute conditions are known to elevate hsTnI results.  Refer to the "Links" section for chest pain algorithms and additional  guidance. Performed at [facility], [address] [place],  [address]    Dg Chest 2 View  Result  Date: 10/10/2018 CLINICAL DATA:  Chest pain EXAM: CHEST - 2 VIEW COMPARISON:  March 09, 2015. FINDINGS: The lungs are clear without focal consolidation, edema, effusion or pneumothorax. Cardiomediastinal silhouette is within normal limits for size. No acute osseous findings. IMPRESSION: No acute cardiopulmonary process. Electronically Signed   By: Prudencio Pair M.D.   On: 10/10/2018 13:42    Pending Labs Unresulted Labs (From admission, onward)    Start     Ordered   10/11/18 0300  APTT  Once-Timed,   STAT     10/10/18 1822   10/11/18 0300  Heparin level (unfractionated)  Once-Timed,   STAT     10/10/18 1822   10/11/18 0300  CBC  Once-Timed,   STAT     10/10/18 1822   Signed and Held  Basic metabolic panel  Tomorrow morning,   R     Signed and Held   Signed and Held  CBC  Tomorrow morning,   R     Signed and Held          Vitals/Pain Today's Vitals   10/10/18 1837 10/10/18 1900 10/10/18 1930 10/10/18 2000  BP: (!) 167/95 (!) 159/93 (!) 163/91 (!) 175/95  Pulse: 64 (!) 55 62 (!) 59  Resp: 18 18 18 14   Temp:      TempSrc:      SpO2: 99% 96% 96% 99%  Weight:      Height:      PainSc:        Isolation Precautions No active isolations  Medications Medications  heparin ADULT infusion 100 units/mL (25000 units/241mL sodium chloride 0.45%) (900 Units/hr Intravenous New Bag/Given  10/10/18 1946)  sodium chloride flush (NS) 0.9 % injection 3 mL (3 mLs Intravenous Given 10/10/18 1804)  aspirin chewable tablet 324 mg (324 mg Oral Given 10/10/18 1803)  fentaNYL (SUBLIMAZE) injection 50 mcg (50 mcg Intravenous Given 10/10/18 1804)  heparin bolus via infusion 4,000 Units (4,000 Units Intravenous Bolus from Bag 10/10/18 1943)    Mobility walks Low fall risk   Focused Assessments Cardiac Assessment Handoff:  Cardiac Rhythm: Normal sinus rhythm Lab Results  Component Value Date   TROPONINI <0.03 04/28/2018   No results found for: DDIMER Does the Patient currently have chest pain? No      R Recommendations: See Admitting Provider Note  Report given to:   Additional Notes:

## 2018-10-10 NOTE — ED Notes (Signed)
This RN Soil scientist of elevated troponin.

## 2018-10-10 NOTE — Progress Notes (Signed)
Patient has cash, [phone]), debit/credit card on his belongings, declined for it to be locked in the security. Patient stated he will keep it with him.

## 2018-10-11 ENCOUNTER — Encounter: Payer: Self-pay | Admitting: Anesthesiology

## 2018-10-11 ENCOUNTER — Encounter
Admission: EM | Disposition: A | Discharge status: Home / Self Care | Payer: Self-pay | Source: Home / Self Care | Attending: Internal Medicine

## 2018-10-11 ENCOUNTER — Inpatient Hospital Stay: Admit: 2018-10-11 | Payer: Medicare Other

## 2018-10-11 HISTORY — PX: LEFT HEART CATH AND CORONARY ANGIOGRAPHY: CATH118249

## 2018-10-11 LAB — CBC
HCT: 37.5 % — ABNORMAL LOW (ref 39.0–52.0)
HCT: 37.8 % — ABNORMAL LOW (ref 39.0–52.0)
Hemoglobin: 12.8 g/dL — ABNORMAL LOW (ref 13.0–17.0)
Hemoglobin: 12.8 g/dL — ABNORMAL LOW (ref 13.0–17.0)
MCH: 33.2 pg (ref 26.0–34.0)
MCH: 32.7 pg (ref 26.0–34.0)
MCHC: 34.1 g/dL (ref 30.0–36.0)
MCHC: 33.9 g/dL (ref 30.0–36.0)
MCV: 97.2 fL (ref 80.0–100.0)
MCV: 96.7 fL (ref 80.0–100.0)
Platelets: 185 10*3/uL (ref 150–400)
Platelets: 176 10*3/uL (ref 150–400)
RBC: 3.86 MIL/uL — ABNORMAL LOW (ref 4.22–5.81)
RBC: 3.91 MIL/uL — ABNORMAL LOW (ref 4.22–5.81)
RDW: 12.2 % (ref 11.5–15.5)
RDW: 12.4 % (ref 11.5–15.5)
WBC: 4.5 10*3/uL (ref 4.0–10.5)
WBC: 5.4 10*3/uL (ref 4.0–10.5)
nRBC: 0 % (ref 0.0–0.2)
nRBC: 0 % (ref 0.0–0.2)

## 2018-10-11 LAB — URINE DRUG SCREEN, QUALITATIVE (ARMC ONLY)
Amphetamines, Ur Screen: NOT DETECTED
Barbiturates, Ur Screen: NOT DETECTED
Benzodiazepine, Ur Scrn: NOT DETECTED
Cannabinoid 50 Ng, Ur ~~LOC~~: POSITIVE — AB
Cocaine Metabolite,Ur ~~LOC~~: NOT DETECTED
MDMA (Ecstasy)Ur Screen: NOT DETECTED
Methadone Scn, Ur: NOT DETECTED
Opiate, Ur Screen: NOT DETECTED
Phencyclidine (PCP) Ur S: NOT DETECTED
Tricyclic, Ur Screen: NOT DETECTED

## 2018-10-11 LAB — BASIC METABOLIC PANEL
Anion gap: 8 (ref 5–15)
Anion gap: 6 (ref 5–15)
BUN: 12 mg/dL (ref 8–23)
BUN: 12 mg/dL (ref 8–23)
CO2: 23 mmol/L (ref 22–32)
CO2: 22 mmol/L (ref 22–32)
Calcium: 8.6 mg/dL — ABNORMAL LOW (ref 8.9–10.3)
Calcium: 8.6 mg/dL — ABNORMAL LOW (ref 8.9–10.3)
Chloride: 109 mmol/L (ref 98–111)
Chloride: 110 mmol/L (ref 98–111)
Creatinine, Ser: 0.85 mg/dL (ref 0.61–1.24)
Creatinine, Ser: 0.98 mg/dL (ref 0.61–1.24)
GFR calc Af Amer: >60 mL/min (ref >60)
GFR calc Af Amer: >60 mL/min (ref >60)
GFR calc non Af Amer: >60 mL/min (ref >60)
GFR calc non Af Amer: >60 mL/min (ref >60)
Glucose, Bld: 91 mg/dL (ref 70–99)
Glucose, Bld: 158 mg/dL — ABNORMAL HIGH (ref 70–99)
Potassium: 3.5 mmol/L (ref 3.5–5.1)
Potassium: 3.9 mmol/L (ref 3.5–5.1)
Sodium: 140 mmol/L (ref 135–145)
Sodium: 138 mmol/L (ref 135–145)

## 2018-10-11 LAB — TROPONIN I (HIGH SENSITIVITY)
Troponin I (High Sensitivity): 2221 ng/L (ref <18)
Troponin I (High Sensitivity): 2823 ng/L (ref <18)

## 2018-10-11 LAB — APTT: aPTT: 66 seconds — ABNORMAL HIGH (ref 24–36)

## 2018-10-11 LAB — HEPARIN LEVEL (UNFRACTIONATED)
Heparin Unfractionated: 0.27 IU/mL — ABNORMAL LOW (ref 0.30–0.70)
Heparin Unfractionated: 0.39 IU/mL (ref 0.30–0.70)

## 2018-10-11 LAB — PROTIME-INR
INR: 1.1 (ref 0.8–1.2)
Prothrombin Time: 14 seconds (ref 11.4–15.2)

## 2018-10-11 SURGERY — LEFT HEART CATH AND CORONARY ANGIOGRAPHY
Anesthesia: Moderate Sedation | Laterality: Right

## 2018-10-11 MED ORDER — SODIUM CHLORIDE 0.9 % WEIGHT BASED INFUSION: 1.0000 mL/kg/h | INTRAVENOUS | Status: DC

## 2018-10-11 MED ORDER — MIDAZOLAM HCL 2 MG/2ML IJ SOLN
INTRAMUSCULAR | Status: AC
  Filled 2018-10-11: qty 2

## 2018-10-11 MED ORDER — HEPARIN (PORCINE) IN NACL 1000-0.9 UT/500ML-% IV SOLN
INTRAVENOUS | Status: AC
  Filled 2018-10-11: qty 1000

## 2018-10-11 MED ORDER — FENTANYL CITRATE (PF) 100 MCG/2ML IJ SOLN
INTRAMUSCULAR | Status: DC | PRN
  Administered 2018-10-11: 25 ug via INTRAVENOUS

## 2018-10-11 MED ORDER — ACETAMINOPHEN 325 MG PO TABS: 650.0000 mg | ORAL_TABLET | Freq: Four times a day (QID) | ORAL | Status: DC | PRN

## 2018-10-11 MED ORDER — SODIUM CHLORIDE 0.9% FLUSH: 3.0000 mL | INTRAVENOUS | Status: DC | PRN

## 2018-10-11 MED ORDER — LABETALOL HCL 5 MG/ML IV SOLN: 10.0000 mg | INTRAVENOUS | Status: DC | PRN

## 2018-10-11 MED ORDER — ASPIRIN 81 MG PO CHEW
81.0000 mg | CHEWABLE_TABLET | ORAL | Status: AC
  Administered 2018-10-11: 81 mg via ORAL

## 2018-10-11 MED ORDER — HEPARIN BOLUS VIA INFUSION
1000.0000 [IU] | Freq: Once | INTRAVENOUS | Status: AC
  Administered 2018-10-11: 1000 [IU] via INTRAVENOUS
  Filled 2018-10-11: qty 1000

## 2018-10-11 MED ORDER — IOHEXOL 300 MG/ML  SOLN
INTRAMUSCULAR | Status: DC | PRN
  Administered 2018-10-11: 13:00:00 80 mL via INTRA_ARTERIAL

## 2018-10-11 MED ORDER — ONDANSETRON HCL 4 MG/2ML IJ SOLN: 4.0000 mg | Freq: Four times a day (QID) | INTRAMUSCULAR | Status: DC | PRN

## 2018-10-11 MED ORDER — HYDRALAZINE HCL 20 MG/ML IJ SOLN: 10.0000 mg | INTRAMUSCULAR | Status: DC | PRN

## 2018-10-11 MED ORDER — ACETAMINOPHEN 325 MG PO TABS: 650.0000 mg | ORAL_TABLET | ORAL | Status: DC | PRN

## 2018-10-11 MED ORDER — SODIUM CHLORIDE 0.9% FLUSH: 3.0000 mL | Freq: Two times a day (BID) | INTRAVENOUS | Status: DC

## 2018-10-11 MED ORDER — SODIUM CHLORIDE 0.9 % IV SOLN: 250.0000 mL | INTRAVENOUS | Status: DC | PRN

## 2018-10-11 MED ORDER — HEPARIN SODIUM (PORCINE) 1000 UNIT/ML IJ SOLN
INTRAMUSCULAR | Status: DC | PRN
  Administered 2018-10-11: 2000 [IU] via INTRAVENOUS

## 2018-10-11 MED ORDER — ASPIRIN 81 MG PO CHEW
CHEWABLE_TABLET | ORAL | Status: AC
  Filled 2018-10-11: qty 1

## 2018-10-11 MED ORDER — SODIUM CHLORIDE 0.9 % WEIGHT BASED INFUSION: 3.0000 mL/kg/h | INTRAVENOUS | Status: DC

## 2018-10-11 MED ORDER — FENTANYL CITRATE (PF) 100 MCG/2ML IJ SOLN
INTRAMUSCULAR | Status: AC
  Filled 2018-10-11: qty 2

## 2018-10-11 MED ORDER — VERAPAMIL HCL 2.5 MG/ML IV SOLN
INTRAVENOUS | Status: DC | PRN
  Administered 2018-10-11: 2.5 mg via INTRA_ARTERIAL

## 2018-10-11 MED ORDER — MIDAZOLAM HCL 2 MG/2ML IJ SOLN
INTRAMUSCULAR | Status: DC | PRN
  Administered 2018-10-11: 1 mg via INTRAVENOUS

## 2018-10-11 MED ORDER — SODIUM CHLORIDE 0.9% FLUSH
3.0000 mL | Freq: Two times a day (BID) | INTRAVENOUS | Status: DC
  Administered 2018-10-11: 3 mL via INTRAVENOUS

## 2018-10-11 MED ORDER — VERAPAMIL HCL 2.5 MG/ML IV SOLN
INTRAVENOUS | Status: AC
  Filled 2018-10-11: qty 2

## 2018-10-11 MED ORDER — HEPARIN (PORCINE) IN NACL 1000-0.9 UT/500ML-% IV SOLN
INTRAVENOUS | Status: DC | PRN
  Administered 2018-10-11: 500 mL

## 2018-10-11 MED ORDER — HEPARIN SODIUM (PORCINE) 1000 UNIT/ML IJ SOLN
INTRAMUSCULAR | Status: AC
  Filled 2018-10-11: qty 1

## 2018-10-11 SURGICAL SUPPLY — 8 items
CATH INFINITI 5 FR JL3.5 (CATHETERS) ×2 IMPLANT
CATH INFINITI 5FR ANG PIGTAIL (CATHETERS) ×2 IMPLANT
CATH INFINITI JR4 5F (CATHETERS) ×2 IMPLANT
DEVICE RAD TR BAND REGULAR (VASCULAR PRODUCTS) ×2 IMPLANT
GLIDESHEATH SLEND SS 6F .021 (SHEATH) ×2 IMPLANT
KIT MANI 3VAL PERCEP (MISCELLANEOUS) ×3 IMPLANT
PACK CARDIAC CATH (CUSTOM PROCEDURE TRAY) ×3 IMPLANT
WIRE ROSEN-J .035X260CM (WIRE) ×2 IMPLANT

## 2018-10-11 NOTE — Progress Notes (Signed)
Patient transported to DC via [place] by NT. Reviewed all instructions with patient as well as son. Explicitly instructed pt not to take Xarelto tonight. Also reviewed meds to be taken tonight, as well as meds to resume in the am. Pt instructed to leave right radial dressing in place for 24 hours, until tomorrow at 2 pm. Noted on dressing. Right hand warm and dry; instructed to notify healthcare provider for bleeding, increased pain, numbness or coldness. Patient repeatedly states he may not take the medicines as prescribed because they do not make him feel good. Reminded to discuss with PCP and cardiology as he is scheduled to see Cards on Monday. Denies chest pain at this time.

## 2018-10-11 NOTE — Discharge Instructions (Signed)
Follow-up with primary care physician in 3 to 4 days Follow-up with cardiology Dr. Yancey Flemings on Monday at 70 AM

## 2018-10-11 NOTE — Progress Notes (Signed)
Addendum to previous note: Pt was also advised not to drive for 24 hours as he received sedation during heart cath. He has his truck here and states he will get his other son to pick it up.

## 2018-10-11 NOTE — Progress Notes (Signed)
Cariac cath had o significant disease with EF 45%. Not sure why had such troponin elevation, may restart xarelto and f/u Monday 10 am and can go home

## 2018-10-11 NOTE — TOC Transition Note (Signed)
Transition of Care [facility]) - CM/SW Discharge Note   Patient Details  Name: Bruce [name] MRN: [id number] Date of Birth: January 11, 1946  Transition of Care [facility]) CM/SW Contact:  Katrina Stack, RN Phone Number: 10/11/2018, 3:28 PM   Clinical Narrative:   Per attending and other members of the care team- no discharge needs          Patient Goals and CMS Choice        Discharge Placement                       Discharge Plan and Services                                     Social Determinants of Health (SDOH) Interventions     Readmission Risk Interventions No flowsheet data found.

## 2018-10-11 NOTE — Discharge Summary (Signed)
Bruce [name] NAME: Bruce [name]    MR#:  [id number]  DATE OF BIRTH:  1945/08/09  DATE OF ADMISSION:  10/10/2018 ADMITTING PHYSICIAN: Vaughan Basta, MD  DATE OF DISCHARGE:  10/11/18  PRIMARY CARE PHYSICIAN: Danelle Berry, NP    ADMISSION DIAGNOSIS:  Chest pain, unspecified type [R07.9]  DISCHARGE DIAGNOSIS:  Principal Problem:   NSTEMI (non-ST elevated myocardial infarction) ([place])   SECONDARY DIAGNOSIS:   Past Medical History:  Diagnosis Date  . Anginal pain ([place])    Left side if chest ,NTG  relieves chaes apin 12/01/13  . Anxiety   . Arthritis   . Cancer [facility])    colon cancer  . CHF (congestive heart failure) ([place])   . 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 Complication of anesthesia    wake up with a head ache  . COPD (chronic obstructive pulmonary disease) ([place])   . Coronary artery disease   . GERD (gastroesophageal reflux disease)   . Headache    related to sinus congestion  . History of blood transfusion   . History of kidney stones   . Hypertension   . Myocardial infarction [facility])    '09 AND '12  . Peripheral vascular disease ([place])   . Shortness of breath    With exertion .  Marland Kitchen UTI (urinary tract infection)    frequent UTI    HOSPITAL COURSE:   HISTORY OF PRESENT ILLNESS: Bruce [name]  is a 73 y.o. male with a known history of anginal chest pain, anxiety, arthritis, colon cancer, CHF, COPD, coronary artery disease, kidney stone, hypertension, peripheral vascular disease, UTI-he is taking Xarelto every day for DVT- had chest pain in central area radiation to left shoulder and arm. No associated activities with that. He was just sitting this morning when pain started.  He denies SOB or cough. The pain still persisted in ER.  His troponin has jumped from 40-240 in  2 hours in ER, so ER physician spoke to his primary cardiologist Dr. Thelma Barge  suggested to start on heparin drip and admit to hospitalist service. Patient has history of coronary artery disease and as per him he had cath done by Dr. Humphrey Rolls but at that time the stents were not placed as he did not had major blockages.  This was in 2009.  He was on aspirin initially but since he was diagnosed with blood clot and started on Xarelto Dr. Yancey Flemings told him to stop taking aspirin.  *Non-STEMI Troponin is significant rise. Patient has positive history of CAD Started on heparin drip as advised by cardiologist. Continue on metoprolol, atorvastatin.  He is not on aspirin as mentioned above because he was on Xarelto. Held benazepril and Lasix  echocardiogram. Cardiologist Dr. Humphrey Rolls has seen the patient in cardiac catheterization was done No significant disease with ejection fraction 45% not sure why patient had troponin elevation recommending to resume Xarelto and to discharge patient from cardiology standpoint.  Outpatient follow-up on Monday at 10 AM Patient is resting comfortably following cardiac cath and feels comfortable to be discharged  *Hypertension Continue metoprolol, losartan, Ranexa Hold benazepril and Lasix.  *Hyperlipidemia Continue atorvastatin and omega-3 fatty acids.  *History of DVT Patient is on Xarelto, currently held because of heparin drip.  *COPD Currently no exacerbation symptoms I would continue his inhalers.  *Active smoking Counseled to quit smoking for 4 minutes and offered nicotine patch.   DISCHARGE  CONDITIONS:   Fair   CONSULTS OBTAINED:  Treatment Team:  [name], MD   PROCEDURES  Cardiac cath   DRUG ALLERGIES:   Allergies  Allergen Reactions  . Adhesive [Tape] Other (See Comments)    After right leg fracture surgery, pt developed a large blister where tape was applied to his right leg. OK to use paper tape.  . Imdur [Isosorbide Dinitrate] Other (See Comments)    hallucinations  . Singulair [Montelukast Sodium]  Other (See Comments)    Hallucinations     DISCHARGE MEDICATIONS:   Allergies as of 10/11/2018      Reactions   Adhesive [tape] Other (See Comments)   After right leg fracture surgery, pt developed a large blister where tape was applied to his right leg. OK to use paper tape.   Imdur [isosorbide Dinitrate] Other (See Comments)   hallucinations   Singulair [montelukast Sodium] Other (See Comments)   Hallucinations      Medication List    STOP taking these medications   benazepril 20 MG tablet Commonly known as: LOTENSIN   furosemide 20 MG tablet Commonly known as: LASIX     TAKE these medications   acetaminophen 325 MG tablet Commonly known as: TYLENOL Take 2 tablets (650 mg total) by mouth every 6 (six) hours as needed for mild pain or headache.   albuterol 108 (90 Base) MCG/ACT inhaler Commonly known as: VENTOLIN HFA Inhale 2 puffs into the lungs every 6 (six) hours as needed for wheezing or shortness of breath.   ALL DAY ALLERGY PO Take 1 tablet by mouth daily.   allopurinol 100 MG tablet Commonly known as: ZYLOPRIM Take 100 mg by mouth at bedtime.   atorvastatin 80 MG tablet Commonly known as: LIPITOR Take 1 tablet (80 mg total) by mouth at bedtime for 30 days.   cholecalciferol 25 MCG (1000 UT) tablet Commonly known as: VITAMIN D Take 1,000 Units by mouth daily.   FISH OIL PO Take 1,000 mg by mouth daily.   fluticasone 50 MCG/ACT nasal spray Commonly known as: FLONASE Place 1 spray into both nostrils daily for 30 days.   gabapentin 300 MG capsule Commonly known as: NEURONTIN   loperamide 2 MG capsule Commonly known as: IMODIUM Take by mouth as needed for diarrhea or loose stools.   losartan 50 MG tablet Commonly known as: COZAAR Take 50 mg by mouth daily.   metoprolol succinate 25 MG 24 hr tablet Commonly known as: TOPROL-XL Take 25 mg by mouth daily.   mirtazapine 15 MG tablet Commonly known as: REMERON Take 1 tablet (15 mg total) by  mouth at bedtime for 30 days.   montelukast 10 MG tablet Commonly known as: SINGULAIR Take 10 mg by mouth at bedtime.   multivitamin tablet Take 1 tablet by mouth daily. For Men   nitroGLYCERIN 0.4 MG SL tablet Commonly known as: NITROSTAT Place 0.4 mg under the tongue every 5 (five) minutes as needed for chest pain.   omeprazole 40 MG capsule Commonly known as: PRILOSEC Take 40 mg by mouth daily.   potassium chloride 10 MEQ tablet Commonly known as: K-DUR Take 10 mEq by mouth daily.   PROBIOTIC FORMULA PO Take 1 capsule by mouth at bedtime.   Ranexa 1000 MG SR tablet Generic drug: ranolazine Take 1,000 mg by mouth every morning.   rivaroxaban 20 MG Tabs tablet Commonly known as: XARELTO Take 20 mg by mouth daily with supper.   tamsulosin 0.4 MG Caps capsule Commonly known  as: FLOMAX TAKE ONE CAPSULE BY MOUTH ONCE DAILY AFTER SUPPER What changed: See the new instructions.   traZODone 150 MG tablet Commonly known as: DESYREL Take 1 tablet (150 mg total) by mouth at bedtime.        DISCHARGE INSTRUCTIONS:   Follow-up with primary care physician in 3 to 4 days Follow-up with cardiology Dr. Yancey Flemings on Monday at 10 AM   DIET:  Cardiac diet  DISCHARGE CONDITION:  Fair  ACTIVITY:  Activity as tolerated  OXYGEN:  Home Oxygen: No.   Oxygen Delivery: room air  DISCHARGE LOCATION:  home   If you experience worsening of your admission symptoms, develop shortness of breath, life threatening emergency, suicidal or homicidal thoughts you must seek medical attention immediately by calling 911 or calling your MD immediately  if symptoms less severe.  You Must read complete instructions/literature along with all the possible adverse reactions/side effects for all the Medicines you take and that have been prescribed to you. Take any new Medicines after you have completely understood and accpet all the possible adverse reactions/side effects.   Please note  You were  cared for by a hospitalist during your hospital stay. If you have any questions about your discharge medications or the care you received while you were in the hospital after you are discharged, you can call the unit and asked to speak with the hospitalist on call if the hospitalist that took care of you is not available. Once you are discharged, your primary care physician will handle any further medical issues. Please note that NO REFILLS for any discharge medications will be authorized once you are discharged, as it is imperative that you return to your primary care physician (or establish a relationship with a primary care physician if you do not have one) for your aftercare needs so that they can reassess your need for medications and monitor your lab values.     Today  Chief Complaint  Patient presents with  . Chest Pain  . Shortness of Breath   Patient is seen before and after cardiac cath.  Resting comfortably after cardiac cath.  Denies any chest pain shortness of breath.  Son at bedside.  Patient wants to go home.  Okay to discharge patient from cardiology standpoint  ROS:  CONSTITUTIONAL: Denies fevers, chills. Denies any fatigue, weakness.  EYES: Denies blurry vision, double vision, eye pain. EARS, NOSE, THROAT: Denies tinnitus, ear pain, hearing loss. RESPIRATORY: Denies cough, wheeze, shortness of breath.  CARDIOVASCULAR: Denies chest pain, palpitations, edema.  GASTROINTESTINAL: Denies nausea, vomiting, diarrhea, abdominal pain. Denies bright red blood per rectum. GENITOURINARY: Denies dysuria, hematuria. ENDOCRINE: Denies nocturia or thyroid problems. HEMATOLOGIC AND LYMPHATIC: Denies easy bruising or bleeding. SKIN: Denies rash or lesion. MUSCULOSKELETAL: Denies pain in neck, back, shoulder, knees, hips or arthritic symptoms.  NEUROLOGIC: Denies paralysis, paresthesias.  PSYCHIATRIC: Denies anxiety or depressive symptoms.   VITAL SIGNS:  Blood pressure 110/65, pulse (!)  58, temperature 98 F (36.7 C), temperature source Oral, resp. rate 19, height 6\' 1"  (1.854 m), weight 77.6 kg, SpO2 92 %.  I/O:    Intake/Output Summary (Last 24 hours) at 10/11/2018 1545 Last data filed at 10/11/2018 1301 Gross per 24 hour  Intake -  Output 425 ml  Net -425 ml    PHYSICAL EXAMINATION:  GENERAL:  73 y.o.-year-old patient lying in the bed with no acute distress.  EYES: Pupils equal, round, reactive to light and accommodation. No scleral icterus. Extraocular muscles intact.  HEENT: Head  atraumatic, normocephalic. Oropharynx and nasopharynx clear.  NECK:  Supple, no jugular venous distention. No thyroid enlargement, no tenderness.  LUNGS: Normal breath sounds bilaterally, no wheezing, rales,rhonchi or crepitation. No use of accessory muscles of respiration.  CARDIOVASCULAR: S1, S2 normal. No murmurs, rubs, or gallops.  ABDOMEN: Soft, non-tender, non-distended. Bowel sounds present.  EXTREMITIES: Wrist with clean dressing status post cardiac cath ,no pedal edema, cyanosis, or clubbing.  NEUROLOGIC: Cranial nerves II through XII are intact. Muscle strength 5/5 in all extremities. Sensation intact. Gait not checked.  PSYCHIATRIC: The patient is alert and oriented x 3.  SKIN: No obvious rash, lesion, or ulcer.   DATA REVIEW:   CBC Recent Labs  Lab 10/11/18 1516  WBC 5.4  HGB 12.8*  HCT 37.8*  PLT 176    Chemistries  Recent Labs  Lab 10/11/18 1516  NA 138  K 3.9  CL 110  CO2 22  GLUCOSE 158*  BUN 12  CREATININE 0.98  CALCIUM 8.6*    Cardiac Enzymes No results for input(s): TROPONINI in the last 168 hours.  Microbiology Results  Results for orders placed or performed during the hospital encounter of 10/10/18  SARS Coronavirus 2 [facility] order, Performed in [facility] hospital lab) Nasopharyngeal Nasopharyngeal Swab     Status: None   Collection Time: 10/10/18  6:33 PM   Specimen: Nasopharyngeal Swab  Result Value Ref Range Status   SARS Coronavirus  2 NEGATIVE NEGATIVE Final    Comment: (NOTE) If result is NEGATIVE SARS-CoV-2 target nucleic acids are NOT DETECTED. The SARS-CoV-2 RNA is generally detectable in upper and lower  respiratory specimens during the acute phase of infection. The lowest  concentration of SARS-CoV-2 viral copies this assay can detect is 250  copies / mL. A negative result does not preclude SARS-CoV-2 infection  and should not be used as the sole basis for treatment or other  patient management decisions.  A negative result may occur with  improper specimen collection / handling, submission of specimen other  than nasopharyngeal swab, presence of viral mutation(s) within the  areas targeted by this assay, and inadequate number of viral copies  (<250 copies / mL). A negative result must be combined with clinical  observations, patient history, and epidemiological information. If result is POSITIVE SARS-CoV-2 target nucleic acids are DETECTED. The SARS-CoV-2 RNA is generally detectable in upper and lower  respiratory specimens dur ing the acute phase of infection.  Positive  results are indicative of active infection with SARS-CoV-2.  Clinical  correlation with patient history and other diagnostic information is  necessary to determine patient infection status.  Positive results do  not rule out bacterial infection or co-infection with other viruses. If result is PRESUMPTIVE POSTIVE SARS-CoV-2 nucleic acids MAY BE PRESENT.   A presumptive positive result was obtained on the submitted specimen  and confirmed on repeat testing.  While 2019 novel coronavirus  (SARS-CoV-2) nucleic acids may be present in the submitted sample  additional confirmatory testing may be necessary for epidemiological  and / or clinical management purposes  to differentiate between  SARS-CoV-2 and other Sarbecovirus currently known to infect humans.  If clinically indicated additional testing with an alternate test  methodology  [phone]) is advised. The SARS-CoV-2 RNA is generally  detectable in upper and lower respiratory sp ecimens during the acute  phase of infection. The expected result is Negative. Fact Sheet for Patients:  StrictlyIdeas.no Fact Sheet for Healthcare Providers: BankingDealers.co.za This test is not yet approved or cleared by  the [employer] and has been authorized for detection and/or diagnosis of SARS-CoV-2 by FDA under an Emergency Use Authorization (EUA).  This EUA will remain in effect (meaning this test can be used) for the duration of the COVID-19 declaration under Section 564(b)(1) of the Act, 21 U.S.C. section 360bbb-3(b)(1), unless the authorization is terminated or revoked sooner. Performed at [facility], [address] [place], [place] [address]     RADIOLOGY:  Dg Chest 2 View  Result Date: 10/10/2018 CLINICAL DATA:  Chest pain EXAM: CHEST - 2 VIEW COMPARISON:  March 09, 2015. FINDINGS: The lungs are clear without focal consolidation, edema, effusion or pneumothorax. Cardiomediastinal silhouette is within normal limits for size. No acute osseous findings. IMPRESSION: No acute cardiopulmonary process. Electronically Signed   By: Prudencio Pair M.D.   On: 10/10/2018 13:42    EKG:   Orders placed or performed during the hospital encounter of 10/10/18  . EKG 12-Lead  . EKG 12-Lead  . ED EKG  . ED EKG      Management plans discussed with the patient, son at bed side and they are in agreement.  CODE STATUS:     Code Status Orders  (From admission, onward)         Start     Ordered   10/10/18 2117  Do not attempt resuscitation (DNR)  Continuous    Question Answer Comment  In the event of cardiac or respiratory ARREST Do not call a "code blue"   In the event of cardiac or respiratory ARREST Do not perform Intubation, CPR, defibrillation or ACLS   In the event of cardiac or respiratory ARREST Use  medication by any route, position, wound care, and other measures to relive pain and suffering. May use oxygen, suction and manual treatment of airway obstruction as needed for comfort.      10/10/18 2116        Code Status History    Date Active Date Inactive Code Status Order ID Comments User Context   04/28/2018 2257 04/30/2018 1633 Full Code [id number]  Lance Coon, MD Inpatient   06/19/2016 1643 06/20/2016 2153 Full Code [id number]  Hollice Espy, MD Inpatient   12/29/2014 2357 01/01/2015 1731 Full Code [id number]  Danne Baxter Inpatient   10/06/2013 1929 10/11/2013 1800 Full Code [id number]  Lisette Abu, PA-C Inpatient   Advance Care Planning Activity    Advance Directive Documentation     Most Recent Value  Type of Advance Directive  Healthcare Power of [place], Living will  Pre-existing out of facility DNR order (yellow form or pink MOST form)  -  "MOST" Form in Place?  -      TOTAL TIME TAKING CARE OF THIS PATIENT: 45  minutes.   Note: This dictation was prepared with Dragon dictation along with smaller phrase technology. Any transcriptional errors that result from this process are unintentional.   @[facility] @  on 10/11/2018 at 3:45 PM  Between 7am to 6pm - Pager - [phone]  After 6pm go to [email or web address] - password [email or web address] [facility]  [facility]  Office  [phone]  CC: Primary care physician; Danelle Berry, NP

## 2018-10-11 NOTE — Progress Notes (Signed)
Patient transported to specials via bed. In NAD. Update was given to [place] by phone.

## 2018-10-11 NOTE — Progress Notes (Signed)
This RN notified by patients son that he believed that his father may be partaking in recreational drug use and this behavior was contributing to his chest pain. MD Jannifer Franklin notified. MD to place orders.

## 2018-10-11 NOTE — Consult Note (Signed)
Bruce [name] is a 73 y.o. male  [id number]  Primary Cardiologist: Neoma Laming Reason for Consultation: Chest pain  HPI: This is a 73 year old white male with a past medical history of coronary artery disease presented to the hospital with chest pain lasting half an hour relieved last night after started IV heparin and has non-STEMI with no further chest pain at this time.  Review of Systems: Has some shortness of breath no dizziness palpitation or syncope   Past Medical History:  Diagnosis Date  . Anginal pain ([place])    Left side if chest ,NTG  relieves chaes apin 12/01/13  . Anxiety   . Arthritis   . Cancer [facility])    colon cancer  . CHF (congestive heart failure) ([place])   . 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 Complication of anesthesia    wake up with a head ache  . COPD (chronic obstructive pulmonary disease) ([place])   . Coronary artery disease   . GERD (gastroesophageal reflux disease)   . Headache    related to sinus congestion  . History of blood transfusion   . History of kidney stones   . Hypertension   . Myocardial infarction [facility])    '09 AND '12  . Peripheral vascular disease ([place])   . Shortness of breath    With exertion .  Marland Kitchen UTI (urinary tract infection)    frequent UTI    Medications Prior to Admission  Medication Sig Dispense Refill  . allopurinol (ZYLOPRIM) 100 MG tablet Take 100 mg by mouth at bedtime.    Marland Kitchen atorvastatin (LIPITOR) 80 MG tablet Take 1 tablet (80 mg total) by mouth at bedtime for 30 days. 30 tablet 0  . benazepril (LOTENSIN) 20 MG tablet Take 20 mg by mouth daily.     . Cetirizine HCl (ALL DAY ALLERGY PO) Take 1 tablet by mouth daily.    . cholecalciferol (VITAMIN D) 1000 units tablet Take 1,000 Units by mouth daily.     . furosemide (LASIX) 20 MG tablet Take 20 mg by mouth daily.    Marland Kitchen losartan (COZAAR) 50 MG tablet Take 50 mg by mouth daily.    . metoprolol succinate (TOPROL-XL) 25 MG 24  hr tablet Take 25 mg by mouth daily.    . montelukast (SINGULAIR) 10 MG tablet Take 10 mg by mouth at bedtime.     . Multiple Vitamin (MULTIVITAMIN) tablet Take 1 tablet by mouth daily. For Men    . Omega-3 Fatty Acids (FISH OIL PO) Take 1,000 mg by mouth daily.     Marland Kitchen omeprazole (PRILOSEC) 40 MG capsule Take 40 mg by mouth daily.    . potassium chloride (K-DUR) 10 MEQ tablet Take 10 mEq by mouth daily.     . Probiotic Product (PROBIOTIC FORMULA PO) Take 1 capsule by mouth at bedtime.     . ranolazine (RANEXA) 1000 MG SR tablet Take 1,000 mg by mouth every morning.     . rivaroxaban (XARELTO) 20 MG TABS tablet Take 20 mg by mouth daily with supper.    . tamsulosin (FLOMAX) 0.4 MG CAPS capsule TAKE ONE CAPSULE BY MOUTH ONCE DAILY AFTER SUPPER (Patient taking differently: Take 0.4 mg by mouth daily after supper. ) 30 capsule 11  . traZODone (DESYREL) 150 MG tablet Take 1 tablet (150 mg total) by mouth at bedtime. 30 tablet 2  . albuterol (PROVENTIL HFA;VENTOLIN HFA) 108 (90 Base) MCG/ACT inhaler Inhale 2  puffs into the lungs every 6 (six) hours as needed for wheezing or shortness of breath. 1 Inhaler 2  . fluticasone (FLONASE) 50 MCG/ACT nasal spray Place 1 spray into both nostrils daily for 30 days. 1 g 0  . gabapentin (NEURONTIN) 300 MG capsule     . loperamide (IMODIUM) 2 MG capsule Take by mouth as needed for diarrhea or loose stools.    . mirtazapine (REMERON) 15 MG tablet Take 1 tablet (15 mg total) by mouth at bedtime for 30 days. 30 tablet 0  . nitroGLYCERIN (NITROSTAT) 0.4 MG SL tablet Place 0.4 mg under the tongue every 5 (five) minutes as needed for chest pain.        Doug Sou Hold] allopurinol  100 mg Oral QHS  . [START ON 10/12/2018] aspirin  81 mg Oral Pre-Cath  . [MAR Hold] atorvastatin  80 mg Oral QHS  . [MAR Hold] cetirizine  5 mg Oral Daily  . [MAR Hold] cholecalciferol  1,000 Units Oral Daily  . [MAR Hold] fluticasone  1 spray Each Nare Daily  . [MAR Hold] gabapentin  300 mg  Oral BID  . [MAR Hold] losartan  50 mg Oral Daily  . [MAR Hold] metoprolol succinate  25 mg Oral Daily  . [MAR Hold] montelukast  10 mg Oral QHS  . [MAR Hold] multivitamin with minerals  1 tablet Oral Daily  . [MAR Hold] omega-3 acid ethyl esters  1 g Oral Daily  . [MAR Hold] pantoprazole  40 mg Oral Daily  . [MAR Hold] ranolazine  1,000 mg Oral q morning - 10a  . [MAR Hold] sodium chloride flush  3 mL Intravenous Q12H  . [MAR Hold] tamsulosin  0.4 mg Oral QPC supper  . [MAR Hold] traZODone  150 mg Oral QHS    Infusions: . sodium chloride    . [START ON 10/12/2018] sodium chloride     Followed by  . [START ON 10/12/2018] sodium chloride    . heparin 1,050 Units/hr (10/11/18 0410)    Allergies  Allergen Reactions  . Adhesive [Tape] Other (See Comments)    After right leg fracture surgery, pt developed a large blister where tape was applied to his right leg. OK to use paper tape.  . Imdur [Isosorbide Dinitrate] Other (See Comments)    hallucinations  . Singulair [Montelukast Sodium] Other (See Comments)    Hallucinations     Social History   Socioeconomic History  . Marital status: Single    Spouse name: Not on file  . Number of children: 2  . Years of education: Not on file  . Highest education level: 6th grade  Occupational History  . Not on file  Social Needs  . Financial resource strain: Very hard  . Food insecurity    Worry: Often true    Inability: Often true  . Transportation needs    Medical: No    Non-medical: No  Tobacco Use  . Smoking status: Current Some Day Smoker    Packs/day: 0.50    Years: 52.00    Pack years: 26.00  . Smokeless tobacco: Never Used  Substance and Sexual Activity  . Alcohol use: No    Alcohol/week: 0.0 standard drinks    Comment: Stopped 2009  . Drug use: No  . Sexual activity: Not Currently  Lifestyle  . Physical activity    Days per week: 0 days    Minutes per session: 0 min  . Stress: Rather much  Relationships  .  Social  connections    Talks on phone: More than three times a week    Gets together: Once a week    Attends religious service: Never    Active member of club or organization: No    Attends meetings of clubs or organizations: Never    Relationship status: Divorced  . Intimate partner violence    Fear of current or ex partner: No    Emotionally abused: No    Physically abused: No    Forced sexual activity: No  Other Topics Concern  . Not on file  Social History Narrative   ** Merged History Encounter **        Family History  Problem Relation Age of Onset  . Hypertension Mother   . Prostate cancer Brother   . Mental illness Neg Hx     PHYSICAL EXAM: Vitals:   10/11/18 0750 10/11/18 1156  BP: 130/72   Pulse: 61 (!) 59  Resp: 19 20  Temp: 98.4 F (36.9 C) 98.7 F (37.1 C)  SpO2: 91% 93%     Intake/Output Summary (Last 24 hours) at 10/11/2018 1202 Last data filed at 10/10/2018 2345 Gross per 24 hour  Intake -  Output 0 ml  Net 0 ml    General:  Well appearing. No respiratory difficulty HEENT: normal Neck: supple. no JVD. Carotids 2+ bilat; no bruits. No lymphadenopathy or thryomegaly appreciated. Cor: PMI nondisplaced. Regular rate & rhythm. No rubs, gallops or murmurs. Lungs: clear Abdomen: soft, nontender, nondistended. No hepatosplenomegaly. No bruits or masses. Good bowel sounds. Extremities: no cyanosis, clubbing, rash, edema Neuro: alert & oriented x 3, cranial nerves grossly intact. moves all 4 extremities w/o difficulty. Affect pleasant.  ECG: Sinus rhythm nonspecific ST-T changes  Results for orders placed or performed during the hospital encounter of 10/10/18 (from the past 24 hour(s))  Basic metabolic panel     Status: Abnormal   Collection Time: 10/10/18  1:25 PM  Result Value Ref Range   Sodium 139 135 - 145 mmol/L   Potassium 4.2 3.5 - 5.1 mmol/L   Chloride 107 98 - 111 mmol/L   CO2 23 22 - 32 mmol/L   Glucose, Bld 116 (H) 70 - 99 mg/dL   BUN 13  8 - 23 mg/dL   Creatinine, Ser 1.04 0.61 - 1.24 mg/dL   Calcium 9.5 8.9 - 10.3 mg/dL   GFR calc non Af Amer >60 >60 mL/min   GFR calc Af Amer >60 >60 mL/min   Anion gap 9 5 - 15  CBC     Status: None   Collection Time: 10/10/18  1:25 PM  Result Value Ref Range   WBC 7.0 4.0 - 10.5 K/uL   RBC 4.41 4.22 - 5.81 MIL/uL   Hemoglobin 14.5 13.0 - 17.0 g/dL   HCT 43.0 39.0 - 52.0 %   MCV 97.5 80.0 - 100.0 fL   MCH 32.9 26.0 - 34.0 pg   MCHC 33.7 30.0 - 36.0 g/dL   RDW 12.0 11.5 - 15.5 %   Platelets 209 150 - 400 K/uL   nRBC 0.0 0.0 - 0.2 %  Troponin I (High Sensitivity)     Status: Abnormal   Collection Time: 10/10/18  1:25 PM  Result Value Ref Range   Troponin I (High Sensitivity) 40 (H) <18 ng/L  Troponin I (High Sensitivity)     Status: Abnormal   Collection Time: 10/10/18  4:02 PM  Result Value Ref Range   Troponin I (High Sensitivity) 243 (HH) <  18 ng/L  SARS Coronavirus 2 [facility] order, Performed in [facility] hospital lab) Nasopharyngeal Nasopharyngeal Swab     Status: None   Collection Time: 10/10/18  6:33 PM   Specimen: Nasopharyngeal Swab  Result Value Ref Range   SARS Coronavirus 2 NEGATIVE NEGATIVE  Protime-INR     Status: None   Collection Time: 10/10/18  6:33 PM  Result Value Ref Range   Prothrombin Time 13.5 11.4 - 15.2 seconds   INR 1.0 0.8 - 1.2  APTT     Status: None   Collection Time: 10/10/18  6:33 PM  Result Value Ref Range   aPTT 29 24 - 36 seconds  Heparin level (unfractionated)     Status: Abnormal   Collection Time: 10/10/18  6:33 PM  Result Value Ref Range   Heparin Unfractionated <0.10 (L) 0.30 - 0.70 IU/mL  Troponin I (High Sensitivity)     Status: Abnormal   Collection Time: 10/10/18  7:34 PM  Result Value Ref Range   Troponin I (High Sensitivity) 765 (HH) <18 ng/L  Troponin I (High Sensitivity)     Status: Abnormal   Collection Time: 10/10/18  9:42 PM  Result Value Ref Range   Troponin I (High Sensitivity) 1,274 (HH) <18 ng/L  Troponin I  (High Sensitivity)     Status: Abnormal   Collection Time: 10/11/18 12:03 AM  Result Value Ref Range   Troponin I (High Sensitivity) 2,221 (HH) <18 ng/L  Urine Drug Screen, Qualitative ([facility] only)     Status: Abnormal   Collection Time: 10/11/18 12:08 AM  Result Value Ref Range   Tricyclic, Ur Screen NONE DETECTED NONE DETECTED   Amphetamines, Ur Screen NONE DETECTED NONE DETECTED   MDMA (Ecstasy)Ur Screen NONE DETECTED NONE DETECTED   Cocaine Metabolite,Ur [place] NONE DETECTED NONE DETECTED   Opiate, Ur Screen NONE DETECTED NONE DETECTED   Phencyclidine (PCP) Ur S NONE DETECTED NONE DETECTED   Cannabinoid 50 Ng, Ur [place] POSITIVE (A) NONE DETECTED   Barbiturates, Ur Screen NONE DETECTED NONE DETECTED   Benzodiazepine, Ur Scrn NONE DETECTED NONE DETECTED   Methadone Scn, Ur NONE DETECTED NONE DETECTED  APTT     Status: Abnormal   Collection Time: 10/11/18  3:01 AM  Result Value Ref Range   aPTT 66 (H) 24 - 36 seconds  Heparin level (unfractionated)     Status: Abnormal   Collection Time: 10/11/18  3:01 AM  Result Value Ref Range   Heparin Unfractionated 0.27 (L) 0.30 - 0.70 IU/mL  Basic metabolic panel     Status: Abnormal   Collection Time: 10/11/18  3:01 AM  Result Value Ref Range   Sodium 140 135 - 145 mmol/L   Potassium 3.5 3.5 - 5.1 mmol/L   Chloride 109 98 - 111 mmol/L   CO2 23 22 - 32 mmol/L   Glucose, Bld 91 70 - 99 mg/dL   BUN 12 8 - 23 mg/dL   Creatinine, Ser 0.85 0.61 - 1.24 mg/dL   Calcium 8.6 (L) 8.9 - 10.3 mg/dL   GFR calc non Af Amer >60 >60 mL/min   GFR calc Af Amer >60 >60 mL/min   Anion gap 8 5 - 15  CBC     Status: Abnormal   Collection Time: 10/11/18  3:01 AM  Result Value Ref Range   WBC 4.5 4.0 - 10.5 K/uL   RBC 3.86 (L) 4.22 - 5.81 MIL/uL   Hemoglobin 12.8 (L) 13.0 - 17.0 g/dL   HCT 37.5 (L) 39.0 -  52.0 %   MCV 97.2 80.0 - 100.0 fL   MCH 33.2 26.0 - 34.0 pg   MCHC 34.1 30.0 - 36.0 g/dL   RDW 12.2 11.5 - 15.5 %   Platelets 185 150 - 400 K/uL   nRBC  0.0 0.0 - 0.2 %  Troponin I (High Sensitivity)     Status: Abnormal   Collection Time: 10/11/18  3:01 AM  Result Value Ref Range   Troponin I (High Sensitivity) 2,823 (HH) <18 ng/L  Heparin level (unfractionated)     Status: None   Collection Time: 10/11/18  9:49 AM  Result Value Ref Range   Heparin Unfractionated 0.39 0.30 - 0.70 IU/mL   Dg Chest 2 View  Result Date: 10/10/2018 CLINICAL DATA:  Chest pain EXAM: CHEST - 2 VIEW COMPARISON:  March 09, 2015. FINDINGS: The lungs are clear without focal consolidation, edema, effusion or pneumothorax. Cardiomediastinal silhouette is within normal limits for size. No acute osseous findings. IMPRESSION: No acute cardiopulmonary process. Electronically Signed   By: Prudencio Pair M.D.   On: 10/10/2018 13:42     ASSESSMENT AND PLAN: Non-STEMI with history of atrial fibrillation and CHF and clinically stable at this time with no further chest pain.  Last Xarelto dosage was yesterday and will do radial access cardiac catheterization today and after the radial catheterization Xarelto can be restarted.  Patient was explained risk and benefit and patient has agreed to the procedure.  Will do cardiac catheterization today.  Maryn Freelove A

## 2018-10-11 NOTE — Progress Notes (Signed)
Patient remains clinically stable post heart cath with Dr. Humphrey Rolls, vitals stable. TR band intact, no bleeding nor hematoma. Report called to Benay Pillow, care nurse with questions answered. Denies complaints at this time.

## 2018-10-11 NOTE — Consult Note (Signed)
[place] for heparin Indication: chest pain/ACS  Allergies  Allergen Reactions  . Adhesive [Tape] Other (See Comments)    After right leg fracture surgery, pt developed a large blister where tape was applied to his right leg. OK to use paper tape.  . Imdur [Isosorbide Dinitrate] Other (See Comments)    hallucinations  . Singulair [Montelukast Sodium] Other (See Comments)    Hallucinations    Patient Measurements: Height: 6\' 1"  (185.4 cm) Weight: 171 lb 8 oz (77.8 kg) IBW/kg (Calculated) : 79.9 Heparin Dosing Weight: 76.2 kg  Vital Signs: Temp: 98.2 F (36.8 C) (08/06 2055) Temp Source: Oral (08/06 2055) BP: 179/89 (08/06 2055) Pulse Rate: 67 (08/06 2055)  Labs: Recent Labs    10/10/18 1325  10/10/18 1833 10/10/18 1934 10/10/18 2142 10/11/18 0003 10/11/18 0301  HGB 14.5  --   --   --   --   --  12.8*  HCT 43.0  --   --   --   --   --  37.5*  PLT 209  --   --   --   --   --  185  APTT  --   --  29  --   --   --  66*  LABPROT  --   --  13.5  --   --   --   --   INR  --   --  1.0  --   --   --   --   HEPARINUNFRC  --   --  <0.10*  --   --   --  0.27*  CREATININE 1.04  --   --   --   --   --   --   TROPONINIHS 40*   < >  --  765* 1,274* 2,221*  --    < > = values in this interval not displayed.    Estimated Creatinine Clearance: 71.7 mL/min (by C-G formula based on SCr of 1.04 mg/dL).   Medical History: Past Medical History:  Diagnosis Date  . Anginal pain ([place])    Left side if chest ,NTG  relieves chaes apin 12/01/13  . Anxiety   . Arthritis   . Cancer [facility])    colon cancer  . CHF (congestive heart failure) ([place])   . 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 Complication of anesthesia    wake up with a head ache  . COPD (chronic obstructive pulmonary disease) ([place])   . Coronary artery disease   . GERD (gastroesophageal reflux disease)   . Headache    related to sinus  congestion  . History of blood transfusion   . History of kidney stones   . Hypertension   . Myocardial infarction [facility])    '09 AND '12  . Peripheral vascular disease ([place])   . Shortness of breath    With exertion .  Marland Kitchen UTI (urinary tract infection)    frequent UTI    Medications:  Medications Prior to Admission  Medication Sig Dispense Refill Last Dose  . allopurinol (ZYLOPRIM) 100 MG tablet Take 100 mg by mouth at bedtime.   10/09/2018 at 2000  . atorvastatin (LIPITOR) 80 MG tablet Take 1 tablet (80 mg total) by mouth at bedtime for 30 days. 30 tablet 0 10/09/2018 at 2000  . benazepril (LOTENSIN) 20 MG tablet Take 20 mg by mouth daily.    10/10/2018 at 0800  .  Cetirizine HCl (ALL DAY ALLERGY PO) Take 1 tablet by mouth daily.   10/10/2018 at 0800  . cholecalciferol (VITAMIN D) 1000 units tablet Take 1,000 Units by mouth daily.    10/10/2018 at 0800  . furosemide (LASIX) 20 MG tablet Take 20 mg by mouth daily.   Past Week at 0800  . losartan (COZAAR) 50 MG tablet Take 50 mg by mouth daily.   10/10/2018 at 0800  . metoprolol succinate (TOPROL-XL) 25 MG 24 hr tablet Take 25 mg by mouth daily.   10/10/2018 at 0800  . montelukast (SINGULAIR) 10 MG tablet Take 10 mg by mouth at bedtime.    10/09/2018 at 2000  . Multiple Vitamin (MULTIVITAMIN) tablet Take 1 tablet by mouth daily. For Men   10/10/2018 at 0800  . Omega-3 Fatty Acids (FISH OIL PO) Take 1,000 mg by mouth daily.    10/10/2018 at 0800  . omeprazole (PRILOSEC) 40 MG capsule Take 40 mg by mouth daily.   10/10/2018 at 0800  . potassium chloride (K-DUR) 10 MEQ tablet Take 10 mEq by mouth daily.    10/10/2018 at 0800  . Probiotic Product (PROBIOTIC FORMULA PO) Take 1 capsule by mouth at bedtime.    10/09/2018 at 2000  . ranolazine (RANEXA) 1000 MG SR tablet Take 1,000 mg by mouth every morning.    10/10/2018 at 0800  . rivaroxaban (XARELTO) 20 MG TABS tablet Take 20 mg by mouth daily with supper.   10/09/2018 at 1700  . tamsulosin (FLOMAX) 0.4 MG CAPS capsule TAKE  ONE CAPSULE BY MOUTH ONCE DAILY AFTER SUPPER (Patient taking differently: Take 0.4 mg by mouth daily after supper. ) 30 capsule 11 10/09/2018 at 1800  . traZODone (DESYREL) 150 MG tablet Take 1 tablet (150 mg total) by mouth at bedtime. 30 tablet 2 10/09/2018 at 2000  . albuterol (PROVENTIL HFA;VENTOLIN HFA) 108 (90 Base) MCG/ACT inhaler Inhale 2 puffs into the lungs every 6 (six) hours as needed for wheezing or shortness of breath. 1 Inhaler 2 prn at prn  . fluticasone (FLONASE) 50 MCG/ACT nasal spray Place 1 spray into both nostrils daily for 30 days. 1 g 0   . gabapentin (NEURONTIN) 300 MG capsule    Not Taking at Unknown time  . loperamide (IMODIUM) 2 MG capsule Take by mouth as needed for diarrhea or loose stools.   prn at prn  . mirtazapine (REMERON) 15 MG tablet Take 1 tablet (15 mg total) by mouth at bedtime for 30 days. 30 tablet 0   . nitroGLYCERIN (NITROSTAT) 0.4 MG SL tablet Place 0.4 mg under the tongue every 5 (five) minutes as needed for chest pain.    prn at prn   Scheduled:  Infusions:  PRN:  Anti-infectives (From admission, onward)   None     Assessment: Pharmacy consulted to start heparin for ACS. Pt has rivaroxaban listed on his med rec. Baseline labs ordered.   8/7 @ 0301 HL 0.27 (slightly below goal) and aPTT 66.  Pt may not have been taking Xarelto PTA due to HL undetectable.  Will use HL to adjust Heparin  Goal of Therapy:  Heparin level 0.3-0.7 Monitor platelets by anticoagulation protocol: Yes   Plan:  Give rebolus 1000 units x 1 Increase heparin infusion to 1050 units/hr Check heparin level in 6 hours and daily while on heparin Continue to monitor H&H and platelets  [place] Crane, Denitra Donaghey A 10/11/2018,3:46 AM

## 2018-10-11 NOTE — Consult Note (Signed)
[place] for heparin Indication: chest pain/ACS  Allergies  Allergen Reactions  . Adhesive [Tape] Other (See Comments)    After right leg fracture surgery, pt developed a large blister where tape was applied to his right leg. OK to use paper tape.  . Imdur [Isosorbide Dinitrate] Other (See Comments)    hallucinations  . Singulair [Montelukast Sodium] Other (See Comments)    Hallucinations    Patient Measurements: Height: 6\' 1"  (185.4 cm) Weight: 171 lb (77.6 kg) IBW/kg (Calculated) : 79.9 Heparin Dosing Weight: 76.2 kg  Vital Signs: Temp: 98.7 F (37.1 C) (08/07 1156) Temp Source: Oral (08/07 1156) BP: 128/76 (08/07 1200) Pulse Rate: 66 (08/07 1200)  Labs: Recent Labs    10/10/18 1325  10/10/18 1833  10/10/18 2142 10/11/18 0003 10/11/18 0301 10/11/18 0949  HGB 14.5  --   --   --   --   --  12.8*  --   HCT 43.0  --   --   --   --   --  37.5*  --   PLT 209  --   --   --   --   --  185  --   APTT  --   --  29  --   --   --  66*  --   LABPROT  --   --  13.5  --   --   --   --   --   INR  --   --  1.0  --   --   --   --   --   HEPARINUNFRC  --   --  <0.10*  --   --   --  0.27* 0.39  CREATININE 1.04  --   --   --   --   --  0.85  --   TROPONINIHS 40*   < >  --    < > 1,274* 2,221* 2,823*  --    < > = values in this interval not displayed.    Estimated Creatinine Clearance: 87.5 mL/min (by C-G formula based on SCr of 0.85 mg/dL).   Medical History: Past Medical History:  Diagnosis Date  . Anginal pain ([place])    Left side if chest ,NTG  relieves chaes apin 12/01/13  . Anxiety   . Arthritis   . Cancer [facility])    colon cancer  . CHF (congestive heart failure) ([place])   . 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 Complication of anesthesia    wake up with a head ache  . COPD (chronic obstructive pulmonary disease) ([place])   . Coronary artery disease   . GERD (gastroesophageal reflux  disease)   . Headache    related to sinus congestion  . History of blood transfusion   . History of kidney stones   . Hypertension   . Myocardial infarction [facility])    '09 AND '12  . Peripheral vascular disease ([place])   . Shortness of breath    With exertion .  Marland Kitchen UTI (urinary tract infection)    frequent UTI    Medications:  Medications Prior to Admission  Medication Sig Dispense Refill Last Dose  . allopurinol (ZYLOPRIM) 100 MG tablet Take 100 mg by mouth at bedtime.   10/09/2018 at 2000  . atorvastatin (LIPITOR) 80 MG tablet Take 1 tablet (80 mg total) by mouth at bedtime for 30 days. 30 tablet 0 10/09/2018  at 2000  . benazepril (LOTENSIN) 20 MG tablet Take 20 mg by mouth daily.    10/10/2018 at 0800  . Cetirizine HCl (ALL DAY ALLERGY PO) Take 1 tablet by mouth daily.   10/10/2018 at 0800  . cholecalciferol (VITAMIN D) 1000 units tablet Take 1,000 Units by mouth daily.    10/10/2018 at 0800  . furosemide (LASIX) 20 MG tablet Take 20 mg by mouth daily.   Past Week at 0800  . losartan (COZAAR) 50 MG tablet Take 50 mg by mouth daily.   10/10/2018 at 0800  . metoprolol succinate (TOPROL-XL) 25 MG 24 hr tablet Take 25 mg by mouth daily.   10/10/2018 at 0800  . montelukast (SINGULAIR) 10 MG tablet Take 10 mg by mouth at bedtime.    10/09/2018 at 2000  . Multiple Vitamin (MULTIVITAMIN) tablet Take 1 tablet by mouth daily. For Men   10/10/2018 at 0800  . Omega-3 Fatty Acids (FISH OIL PO) Take 1,000 mg by mouth daily.    10/10/2018 at 0800  . omeprazole (PRILOSEC) 40 MG capsule Take 40 mg by mouth daily.   10/10/2018 at 0800  . potassium chloride (K-DUR) 10 MEQ tablet Take 10 mEq by mouth daily.    10/10/2018 at 0800  . Probiotic Product (PROBIOTIC FORMULA PO) Take 1 capsule by mouth at bedtime.    10/09/2018 at 2000  . ranolazine (RANEXA) 1000 MG SR tablet Take 1,000 mg by mouth every morning.    10/10/2018 at 0800  . rivaroxaban (XARELTO) 20 MG TABS tablet Take 20 mg by mouth daily with supper.   10/09/2018 at 1700  .  tamsulosin (FLOMAX) 0.4 MG CAPS capsule TAKE ONE CAPSULE BY MOUTH ONCE DAILY AFTER SUPPER (Patient taking differently: Take 0.4 mg by mouth daily after supper. ) 30 capsule 11 10/09/2018 at 1800  . traZODone (DESYREL) 150 MG tablet Take 1 tablet (150 mg total) by mouth at bedtime. 30 tablet 2 10/09/2018 at 2000  . albuterol (PROVENTIL HFA;VENTOLIN HFA) 108 (90 Base) MCG/ACT inhaler Inhale 2 puffs into the lungs every 6 (six) hours as needed for wheezing or shortness of breath. 1 Inhaler 2 prn at prn  . fluticasone (FLONASE) 50 MCG/ACT nasal spray Place 1 spray into both nostrils daily for 30 days. 1 g 0   . gabapentin (NEURONTIN) 300 MG capsule    Not Taking at Unknown time  . loperamide (IMODIUM) 2 MG capsule Take by mouth as needed for diarrhea or loose stools.   prn at prn  . mirtazapine (REMERON) 15 MG tablet Take 1 tablet (15 mg total) by mouth at bedtime for 30 days. 30 tablet 0   . nitroGLYCERIN (NITROSTAT) 0.4 MG SL tablet Place 0.4 mg under the tongue every 5 (five) minutes as needed for chest pain.    prn at prn   Scheduled:  Infusions:  PRN:  Anti-infectives (From admission, onward)   None     Assessment: Pharmacy consulted to start heparin for ACS. Pt has rivaroxaban listed on his med rec. Baseline labs ordered.   8/7 @ 0301 HL 0.27 (slightly below goal) and aPTT 66.  Pt may not have been taking Xarelto PTA due to HL undetectable.  Will use HL to adjust Heparin. 8/7 @ 0949 HL 0.39. Therapeutic (level was checked by pharmacy prior to this note being written). Plans for cardiac catheterization today. Per cards, patient will resume Xarelto after cardiac catheterization.   Goal of Therapy:  Heparin level 0.3-0.7 Monitor platelets by anticoagulation protocol: Yes  Plan:  Continue heparin infusion to 1050 units/hr Check heparin level in 6 hours and daily while on heparin Continue to monitor H&H and platelets  Alima Naser R Ziara Thelander 10/11/2018,12:12 PM

## 2018-10-14 ENCOUNTER — Encounter: Payer: Self-pay | Admitting: Cardiovascular Disease

## 2018-12-30 ENCOUNTER — Encounter (HOSPITAL_COMMUNITY): Payer: Self-pay | Admitting: Emergency Medicine

## 2018-12-30 ENCOUNTER — Other Ambulatory Visit: Payer: Self-pay

## 2018-12-30 ENCOUNTER — Emergency Department (HOSPITAL_COMMUNITY): Payer: Medicare Other

## 2018-12-30 ENCOUNTER — Inpatient Hospital Stay (HOSPITAL_COMMUNITY)
Admission: EM | Admit: 2018-12-30 | Discharge: 2019-01-02 | DRG: 086 | Disposition: A | Discharge status: Home / Self Care | Payer: Medicare Other | Attending: Internal Medicine | Admitting: Internal Medicine

## 2018-12-30 DIAGNOSIS — I10 Essential (primary) hypertension: Secondary | ICD-10-CM | POA: Diagnosis present

## 2018-12-30 DIAGNOSIS — R402362 Coma scale, best motor response, obeys commands, at arrival to emergency department: Secondary | ICD-10-CM | POA: Diagnosis present

## 2018-12-30 DIAGNOSIS — I5022 Chronic systolic (congestive) heart failure: Secondary | ICD-10-CM | POA: Diagnosis present

## 2018-12-30 DIAGNOSIS — Z79899 Other long term (current) drug therapy: Secondary | ICD-10-CM

## 2018-12-30 DIAGNOSIS — I609 Nontraumatic subarachnoid hemorrhage, unspecified: Secondary | ICD-10-CM

## 2018-12-30 DIAGNOSIS — Z888 Allergy status to other drugs, medicaments and biological substances status: Secondary | ICD-10-CM

## 2018-12-30 DIAGNOSIS — R402142 Coma scale, eyes open, spontaneous, at arrival to emergency department: Secondary | ICD-10-CM | POA: Diagnosis present

## 2018-12-30 DIAGNOSIS — F419 Anxiety disorder, unspecified: Secondary | ICD-10-CM | POA: Diagnosis present

## 2018-12-30 DIAGNOSIS — Z85038 Personal history of other malignant neoplasm of large intestine: Secondary | ICD-10-CM

## 2018-12-30 DIAGNOSIS — S06331A Contusion and laceration of cerebrum, unspecified, with loss of consciousness of 30 minutes or less, initial encounter: Secondary | ICD-10-CM

## 2018-12-30 DIAGNOSIS — Z7901 Long term (current) use of anticoagulants: Secondary | ICD-10-CM

## 2018-12-30 DIAGNOSIS — I110 Hypertensive heart disease with heart failure: Secondary | ICD-10-CM | POA: Diagnosis present

## 2018-12-30 DIAGNOSIS — F17210 Nicotine dependence, cigarettes, uncomplicated: Secondary | ICD-10-CM | POA: Diagnosis present

## 2018-12-30 DIAGNOSIS — K219 Gastro-esophageal reflux disease without esophagitis: Secondary | ICD-10-CM | POA: Diagnosis present

## 2018-12-30 DIAGNOSIS — I739 Peripheral vascular disease, unspecified: Secondary | ICD-10-CM | POA: Diagnosis present

## 2018-12-30 DIAGNOSIS — W19XXXA Unspecified fall, initial encounter: Secondary | ICD-10-CM

## 2018-12-30 DIAGNOSIS — Z8249 Family history of ischemic heart disease and other diseases of the circulatory system: Secondary | ICD-10-CM

## 2018-12-30 DIAGNOSIS — S0285XA Fracture of orbit, unspecified, initial encounter for closed fracture: Secondary | ICD-10-CM | POA: Diagnosis present

## 2018-12-30 DIAGNOSIS — M1990 Unspecified osteoarthritis, unspecified site: Secondary | ICD-10-CM | POA: Diagnosis present

## 2018-12-30 DIAGNOSIS — I252 Old myocardial infarction: Secondary | ICD-10-CM

## 2018-12-30 DIAGNOSIS — S022XXA Fracture of nasal bones, initial encounter for closed fracture: Secondary | ICD-10-CM | POA: Diagnosis present

## 2018-12-30 DIAGNOSIS — R55 Syncope and collapse: Secondary | ICD-10-CM | POA: Diagnosis present

## 2018-12-30 DIAGNOSIS — S0240CA Maxillary fracture, right side, initial encounter for closed fracture: Secondary | ICD-10-CM | POA: Diagnosis present

## 2018-12-30 DIAGNOSIS — W010XXA Fall on same level from slipping, tripping and stumbling without subsequent striking against object, initial encounter: Secondary | ICD-10-CM | POA: Diagnosis present

## 2018-12-30 DIAGNOSIS — R402252 Coma scale, best verbal response, oriented, at arrival to emergency department: Secondary | ICD-10-CM | POA: Diagnosis present

## 2018-12-30 DIAGNOSIS — S066X1A Traumatic subarachnoid hemorrhage with loss of consciousness of 30 minutes or less, initial encounter: Secondary | ICD-10-CM | POA: Diagnosis not present

## 2018-12-30 DIAGNOSIS — I2510 Atherosclerotic heart disease of native coronary artery without angina pectoris: Secondary | ICD-10-CM | POA: Diagnosis present

## 2018-12-30 DIAGNOSIS — S065X1A Traumatic subdural hemorrhage with loss of consciousness of 30 minutes or less, initial encounter: Secondary | ICD-10-CM | POA: Diagnosis present

## 2018-12-30 DIAGNOSIS — Z8546 Personal history of malignant neoplasm of prostate: Secondary | ICD-10-CM

## 2018-12-30 DIAGNOSIS — S0292XA Unspecified fracture of facial bones, initial encounter for closed fracture: Secondary | ICD-10-CM

## 2018-12-30 DIAGNOSIS — I951 Orthostatic hypotension: Secondary | ICD-10-CM | POA: Diagnosis present

## 2018-12-30 DIAGNOSIS — N179 Acute kidney failure, unspecified: Secondary | ICD-10-CM | POA: Diagnosis present

## 2018-12-30 DIAGNOSIS — Z20828 Contact with and (suspected) exposure to other viral communicable diseases: Secondary | ICD-10-CM | POA: Diagnosis present

## 2018-12-30 DIAGNOSIS — Z91048 Other nonmedicinal substance allergy status: Secondary | ICD-10-CM

## 2018-12-30 DIAGNOSIS — F329 Major depressive disorder, single episode, unspecified: Secondary | ICD-10-CM | POA: Diagnosis present

## 2018-12-30 DIAGNOSIS — Z87440 Personal history of urinary (tract) infections: Secondary | ICD-10-CM

## 2018-12-30 DIAGNOSIS — J449 Chronic obstructive pulmonary disease, unspecified: Secondary | ICD-10-CM | POA: Diagnosis present

## 2018-12-30 DIAGNOSIS — C61 Malignant neoplasm of prostate: Secondary | ICD-10-CM | POA: Diagnosis present

## 2018-12-30 DIAGNOSIS — E785 Hyperlipidemia, unspecified: Secondary | ICD-10-CM | POA: Diagnosis present

## 2018-12-30 DIAGNOSIS — I629 Nontraumatic intracranial hemorrhage, unspecified: Secondary | ICD-10-CM

## 2018-12-30 DIAGNOSIS — Z86718 Personal history of other venous thrombosis and embolism: Secondary | ICD-10-CM

## 2018-12-30 DIAGNOSIS — I480 Paroxysmal atrial fibrillation: Secondary | ICD-10-CM | POA: Diagnosis present

## 2018-12-30 DIAGNOSIS — Z87442 Personal history of urinary calculi: Secondary | ICD-10-CM

## 2018-12-30 DIAGNOSIS — Z8673 Personal history of transient ischemic attack (TIA), and cerebral infarction without residual deficits: Secondary | ICD-10-CM

## 2018-12-30 LAB — BASIC METABOLIC PANEL
Anion gap: 9 (ref 5–15)
BUN: 12 mg/dL (ref 8–23)
CO2: 23 mmol/L (ref 22–32)
Calcium: 9.1 mg/dL (ref 8.9–10.3)
Chloride: 105 mmol/L (ref 98–111)
Creatinine, Ser: 1.58 mg/dL — ABNORMAL HIGH (ref 0.61–1.24)
GFR calc Af Amer: 50 mL/min — ABNORMAL LOW (ref >60)
GFR calc non Af Amer: 43 mL/min — ABNORMAL LOW (ref >60)
Glucose, Bld: 172 mg/dL — ABNORMAL HIGH (ref 70–99)
Potassium: 4 mmol/L (ref 3.5–5.1)
Sodium: 137 mmol/L (ref 135–145)

## 2018-12-30 LAB — URINALYSIS, MICROSCOPIC (REFLEX)

## 2018-12-30 LAB — RAPID URINE DRUG SCREEN, HOSP PERFORMED
Amphetamines: NOT DETECTED
Barbiturates: NOT DETECTED
Benzodiazepines: NOT DETECTED
Cocaine: NOT DETECTED
Opiates: NOT DETECTED
Tetrahydrocannabinol: POSITIVE — AB

## 2018-12-30 LAB — CBC
HCT: 38.8 % — ABNORMAL LOW (ref 39.0–52.0)
Hemoglobin: 13 g/dL (ref 13.0–17.0)
MCH: 33.1 pg (ref 26.0–34.0)
MCHC: 33.5 g/dL (ref 30.0–36.0)
MCV: 98.7 fL (ref 80.0–100.0)
Platelets: 197 10*3/uL (ref 150–400)
RBC: 3.93 MIL/uL — ABNORMAL LOW (ref 4.22–5.81)
RDW: 12.7 % (ref 11.5–15.5)
WBC: 9.9 10*3/uL (ref 4.0–10.5)
nRBC: 0 % (ref 0.0–0.2)

## 2018-12-30 LAB — URINALYSIS, ROUTINE W REFLEX MICROSCOPIC
Bilirubin Urine: NEGATIVE
Glucose, UA: NEGATIVE mg/dL
Ketones, ur: NEGATIVE mg/dL
Leukocytes,Ua: NEGATIVE
Nitrite: NEGATIVE
Protein, ur: NEGATIVE mg/dL
Specific Gravity, Urine: 1.02 (ref 1.005–1.030)
pH: 5.5 (ref 5.0–8.0)

## 2018-12-30 LAB — PROTIME-INR
INR: 2 — ABNORMAL HIGH (ref 0.8–1.2)
Prothrombin Time: 22.3 seconds — ABNORMAL HIGH (ref 11.4–15.2)

## 2018-12-30 MED ORDER — LACTATED RINGERS IV BOLUS
500.0000 mL | Freq: Once | INTRAVENOUS | Status: AC
  Administered 2018-12-30: 500 mL via INTRAVENOUS

## 2018-12-30 MED ORDER — SODIUM CHLORIDE 0.9% FLUSH
3.0000 mL | Freq: Once | INTRAVENOUS | Status: AC
  Administered 2019-01-01: 3 mL via INTRAVENOUS

## 2018-12-30 NOTE — ED Triage Notes (Addendum)
Arrived via EMS patient walking and people witnessed patient passing out hit right side of forehead and back of right hand. Alert answering and following commands appropriate. EMS reported initial BP [id number] systolic palpated administered 0.9 NS 55ml and repeat BP 151/87 CBG 126 Patient stated takes Xeralto and last dose was this morning.

## 2018-12-30 NOTE — ED Notes (Signed)
Laila Condit (son) [phone]

## 2018-12-30 NOTE — ED Provider Notes (Addendum)
[facility] EMERGENCY DEPARTMENT Provider Note   CSN: [id number] Arrival date & time: 12/30/18  1631     History   Chief Complaint Chief Complaint  Patient presents with  . Loss of Consciousness    HPI Bruce [name] is a 73 y.o. male.      Loss of Consciousness Episode history:  Single Most recent episode:  Today Duration: Unknown. Timing:  Constant Progression:  Resolved Chronicity:  Recurrent Context: not dehydration and not medication change   Witnessed: yes   Relieved by:  Nothing Worsened by:  Nothing Ineffective treatments:  None tried Associated symptoms: no anxiety, no fever, no nausea, no recent fall, no recent injury, no shortness of breath and no vomiting    Patient reportedly fell outside of a bar onto gravel when he was visiting a friend. He states he had a brief prodrome after lifting something heavy into his friend's truck. He states he is UTD on his tetanus. Currently on xarelto. Patient denies any pain currently. Denies alcohol or drug use.  Past Medical History:  Diagnosis Date  . Anginal pain ([place])    Left side if chest ,NTG  relieves chaes apin 12/01/13  . Anxiety   . Arthritis   . Cancer [facility])    colon cancer  . CHF (congestive heart failure) ([place])   . 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 Complication of anesthesia    wake up with a head ache  . COPD (chronic obstructive pulmonary disease) ([place])   . Coronary artery disease   . GERD (gastroesophageal reflux disease)   . Headache    related to sinus congestion  . History of blood transfusion   . History of kidney stones   . Hypertension   . Myocardial infarction [facility])    '09 AND '12  . Peripheral vascular disease ([place])   . Shortness of breath    With exertion .  Marland Kitchen UTI (urinary tract infection)    frequent UTI    Patient Active Problem List   Diagnosis Date Noted  . NSTEMI (non-ST elevated myocardial infarction)  ([place]) 10/10/2018  . Recurrent syncope 04/28/2018  . Malignant neoplasm of prostate ([place]) 07/13/2016  . 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 Visual hallucination 03/18/2015  . Auditory hallucination 03/18/2015  . 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 Tibial plateau fracture 12/29/2014  . Limb ischemia 11/18/2013  . Blunt trauma of lower leg 10/08/2013  . 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[place]) 10/08/2013  . Acute blood loss anemia 10/08/2013  . Anxiety disorder 10/08/2013  . Dyslipidemia 10/08/2013  . GERD (gastroesophageal reflux disease) 10/08/2013  . History of MI (myocardial infarction) 10/08/2013  . Anticoagulated 10/08/2013  . Hypertension   . Tibia/fibula fracture 10/06/2013  . CAD (coronary artery disease) 08/16/2010  . Edema 08/16/2010  . COPD (chronic obstructive pulmonary disease) ([place]) 08/16/2010  . DVT of leg (deep venous thrombosis) ([place]) 08/16/2010  . Leg cramps 08/16/2010    Past Surgical History:  Procedure Laterality Date  . CARDIAC CATHETERIZATION    . CHOLECYSTECTOMY    . EXTERNAL FIXATION LEG Right 10/06/2013   Procedure: CLOSED REDUCTION RIGHT TIBIAL PLATEAU FRACTURE, EXTERNAL FIXATION RIGHT LEG, PLACEMENT OF WOUND VAC;  Surgeon: Rozanna Box, MD;  Location: [place];  Service: Orthopedics;  Laterality: Right;  . FEMORAL-POPLITEAL BYPASS GRAFT Right 10/06/2013   Procedure: RIGHT POPLITEAL-POPLITEAL ARTERY BYPASS GRAFT;  Surgeon: Rosetta Posner, MD;  Location[facility];  Service: Vascular;  Laterality: Right;  . FRACTURE SURGERY Right 2014  . HARDWARE REMOVAL Right 12/02/2013   Procedure: REMOVAL EXTERNAL FIXATION RIGHT LEG ;  Surgeon: Rozanna Box, MD;  Location: [place];  Service: Orthopedics;  Laterality: Right;  . HERNIA REPAIR Right 1990's  . I&D EXTREMITY Right 10/09/2013   Procedure: IRRIGATION AND DEBRIDEMENT RIGHT LEG, CLOSURE  OF WOUNDS, PLACEMENT OF WOUND VAC ON EACH SIDE OF LEG;  Surgeon: Rozanna Box, MD;   Location: [place];  Service: Orthopedics;  Laterality: Right;  . IR NEPHROSTOMY PLACEMENT LEFT  06/19/2016  . IVC filter  2009   placed @ [facility]/ Removed in 2010.  . IVC Filter Removed    . LEFT HEART CATH AND CORONARY ANGIOGRAPHY Right 10/11/2018   Procedure: LEFT HEART CATH AND CORONARY ANGIOGRAPHY;  Surgeon: [name], MD;  Location: [place] CV LAB;  Service: Cardiovascular;  Laterality: Right;  . ligament leg Left   . NEPHROLITHOTOMY Left 06/19/2016   Procedure: NEPHROLITHOTOMY PERCUTANEOUS;  Surgeon: Hollice Espy, MD;  Location: [facility] ORS;  Service: Urology;  Laterality: Left;  . ORIF TIBIA FRACTURE Right 12/29/2014   Procedure: OPEN REDUCTION INTERNAL FIXATION (ORIF) RIGHT TIBIA FRACTURE, RIA VS ICBG;  Surgeon: Altamese Springdale, MD;  Location: [place];  Service: Orthopedics;  Laterality: Right;  . RADIOACTIVE SEED IMPLANT N/A 09/12/2016   Procedure: RADIOACTIVE SEED IMPLANT/BRACHYTHERAPY IMPLANT;  Surgeon: Hollice Espy, MD;  Location: [facility] OR[facility];  Service: Urology;  Laterality: N/A;        Home Medications    Prior to Admission medications   Medication Sig Start Date End Date Taking? Authorizing Provider  albuterol (PROVENTIL HFA;VENTOLIN HFA) 108 (90 Base) MCG/ACT inhaler Inhale 2 puffs into the lungs every 6 (six) hours as needed for wheezing or shortness of breath. 04/30/18  Yes Pyreddy, Reatha Harps, MD  allopurinol (ZYLOPRIM) 100 MG tablet Take 100 mg by mouth at bedtime.   Yes [provider]  carvedilol (COREG) 12.5 MG tablet Take 12.5 mg by mouth 2 (two) times daily. 12/03/18  Yes [provider]  Cetirizine HCl (ALL DAY ALLERGY PO) Take 1 tablet by mouth daily.   Yes [provider]  cholecalciferol (VITAMIN D) 1000 units tablet Take 1,000 Units by mouth daily.    Yes [provider]  citalopram (CELEXA) 40 MG tablet Take 40 mg by mouth daily. 11/04/18  Yes [provider]  ezetimibe (ZETIA) 10 MG tablet Take 10 mg by mouth daily. 09/02/18   Yes [provider]  hydrOXYzine (ATARAX/VISTARIL) 10 MG tablet Take 10 mg by mouth at bedtime. 11/04/18  Yes [provider]  losartan (COZAAR) 50 MG tablet Take 50 mg by mouth daily.   Yes [provider]  metoprolol succinate (TOPROL-XL) 25 MG 24 hr tablet Take 25 mg by mouth daily. 05/17/18  Yes [provider]  montelukast (SINGULAIR) 10 MG tablet Take 10 mg by mouth at bedtime.  10/05/16  Yes [provider]  Multiple Vitamin (MULTIVITAMIN) tablet Take 1 tablet by mouth daily. For Men   Yes [provider]  nitroGLYCERIN (NITROSTAT) 0.4 MG SL tablet Place 0.4 mg under the tongue every 5 (five) minutes as needed for chest pain.    Yes [provider]  Omega-3 Fatty Acids (FISH OIL PO) Take 1,000 mg by mouth daily.    Yes [provider]  omeprazole (PRILOSEC) 40 MG capsule Take 40 mg by mouth daily.   Yes [provider]  potassium chloride (K-DUR) 10 MEQ tablet Take 10 mEq by mouth daily.  08/08/16  Yes [provider]  Probiotic Product (PROBIOTIC FORMULA PO) Take 1 capsule by mouth at bedtime.    Yes [provider]  ranolazine (RANEXA) 1000 MG SR tablet Take 1,000 mg by mouth every morning.    Yes [provider]  rivaroxaban (XARELTO) 20 MG TABS tablet Take 20 mg by mouth daily with supper.   Yes [provider]  rOPINIRole (REQUIP) 1 MG tablet Take 1 mg by mouth at bedtime. 11/04/18  Yes [provider]  spironolactone (ALDACTONE) 25 MG tablet Take 25 mg by mouth daily. 11/09/18  Yes [provider]  tamsulosin (FLOMAX) 0.4 MG CAPS capsule TAKE ONE CAPSULE BY MOUTH ONCE DAILY AFTER SUPPER Patient taking differently: Take 0.4 mg by mouth daily after supper.  04/05/18  Yes Chrystal, Eulas Post, MD  traZODone (DESYREL) 150 MG tablet Take 1 tablet (150 mg total) by mouth at bedtime. 02/05/17  Yes Ursula Alert, MD  acetaminophen (TYLENOL) 325 MG tablet Take 2 tablets (650  mg total) by mouth every 6 (six) hours as needed for mild pain or headache. Patient not taking: Reported on 12/30/2018 10/11/18   Nicholes Mango, MD  atorvastatin (LIPITOR) 80 MG tablet Take 1 tablet (80 mg total) by mouth at bedtime for 30 days. 04/30/18 10/10/18  Saundra Shelling, MD  fluticasone (FLONASE) 50 MCG/ACT nasal spray Place 1 spray into both nostrils daily for 30 days. 04/30/18 05/30/18  Saundra Shelling, MD  gabapentin (NEURONTIN) 300 MG capsule Take 300 mg by mouth as needed (pain).  01/15/17   [provider]  loperamide (IMODIUM) 2 MG capsule Take by mouth as needed for diarrhea or loose stools.    [provider]  mirtazapine (REMERON) 15 MG tablet Take 1 tablet (15 mg total) by mouth at bedtime for 30 days. 04/30/18 05/30/18  Saundra Shelling, MD  REPATHA SURECLICK [id number] MG/ML SOAJ Inject 1 Syringe into the skin every 14 (fourteen) days. 12/10/18   [provider]    Family History Family History  Problem Relation Age of Onset  . Hypertension Mother   . Prostate cancer Brother   . Mental illness Neg Hx     Social History Social History   Tobacco Use  . Smoking status: Current Some Day Smoker    Packs/day: 0.50    Years: 52.00    Pack years: 26.00  . Smokeless tobacco: Never Used  Substance Use Topics  . Alcohol use: No    Alcohol/week: 0.0 standard drinks    Comment: Stopped 2009  . Drug use: No     Allergies   Adhesive [tape], Imdur [isosorbide dinitrate], and Singulair [montelukast sodium]   Review of Systems Review of Systems  Constitutional: Negative for fever.  Eyes: Negative for visual disturbance.  Respiratory: Negative for cough and shortness of breath.   Cardiovascular: Positive for syncope.  Gastrointestinal: Negative for abdominal pain, nausea and vomiting.  Genitourinary: Negative for dysuria.  Skin: Positive for wound. Negative for rash.  Neurological: Positive for syncope.  All other systems reviewed and are  negative.    Physical Exam Updated Vital Signs BP 139/81   Pulse 81   Temp 98.4 F (36.9 C) (Oral)   Resp 17   Ht 6\' 1"  (1.854 m)   Wt 80.7 kg   SpO2 93%   BMI 23.48 kg/m   Physical Exam Vitals signs and nursing note reviewed.  Constitutional:      Appearance:  He is well-developed.     Comments: Comfortable appearing, laying in bed  HENT:     Head: Normocephalic.     Nose:     Comments: No septal hematoma    Mouth/Throat:     Mouth: Mucous membranes are moist.  Eyes:     Extraocular Movements: Extraocular movements intact.     Conjunctiva/sclera: Conjunctivae normal.  Neck:     Musculoskeletal: Neck supple.     Comments: Cervical collar placed on exam Cardiovascular:     Rate and Rhythm: Normal rate and regular rhythm.     Pulses: Normal pulses.  Pulmonary:     Effort: Pulmonary effort is normal. No respiratory distress.     Comments: Chest wall not tender, stable to AP and lateral compression Chest:     Chest wall: No tenderness.  Abdominal:     General: There is no distension.     Palpations: Abdomen is soft.     Tenderness: There is no abdominal tenderness. There is no guarding or rebound.  Genitourinary:    Comments: Pelvis stable to AP and lateral compression Skin:    General: Skin is warm and dry.     Comments: Multiple contusions and bruises to the right side of the face.  Neurological:     Mental Status: He is alert.      ED Treatments / Results  Labs (all labs ordered are listed, but only abnormal results are displayed) Labs Reviewed  BASIC METABOLIC PANEL - Abnormal; Notable for the following components:      Result Value   Glucose, Bld 172 (*)    Creatinine, Ser 1.58 (*)    GFR calc non Af Amer 43 (*)    GFR calc Af Amer 50 (*)    All other components within normal limits  CBC - Abnormal; Notable for the following components:   RBC 3.93 (*)    HCT 38.8 (*)    All other components within normal limits  URINALYSIS, ROUTINE W REFLEX  MICROSCOPIC - Abnormal; Notable for the following components:   Hgb urine dipstick TRACE (*)    All other components within normal limits  PROTIME-INR - Abnormal; Notable for the following components:   Prothrombin Time 22.3 (*)    INR 2.0 (*)    All other components within normal limits  URINALYSIS, MICROSCOPIC (REFLEX) - Abnormal; Notable for the following components:   Bacteria, UA RARE (*)    All other components within normal limits  RAPID URINE DRUG SCREEN, HOSP PERFORMED - Abnormal; Notable for the following components:   Tetrahydrocannabinol POSITIVE (*)    All other components within normal limits  SARS CORONAVIRUS 2 (TAT 6-24 HRS)    EKG None  Radiology Ct Head Wo Contrast  Result Date: 12/30/2018 CLINICAL DATA:  Syncopal episode with head strike EXAM: CT HEAD WITHOUT CONTRAST CT CERVICAL SPINE WITHOUT CONTRAST TECHNIQUE: Multidetector CT imaging of the head and cervical spine was performed following the standard protocol without intravenous contrast. Multiplanar CT image reconstructions of the cervical spine were also generated. COMPARISON:  Brain MRI 05/21/2018, CT head 10/12/2009 FINDINGS: CT HEAD FINDINGS Brain: 6 mm focus of hyperattenuation in the left frontal lobe may reflect a small parenchymal contusion. Additional hyperattenuation is seen in the sulci of the anterior frontal poles and in the base of the right precentral sulcus suggesting some subarachnoid blood. No intraventricular hemorrhage. No mass effect or midline shift. No CT evidence of acute infarct. 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 Patchy areas  of white matter hypoattenuation are most compatible with chronic microvascular angiopathy. Symmetric prominence of the ventricles, cisterns and sulci compatible with parenchymal volume loss. Vascular: Atherosclerotic calcification of the carotid siphons. No hyperdense vessel. Skull: Right malar soft tissue swelling. Minimally displaced fractures involving the anterior  and lateral walls of the right maxillary sinus. And lateral wall of the right orbit. Question a small cortical step-off for mild diastasis of the right zygomaticomaxillary suture. No calvarial fracture or scalp hematoma. Fragmentation of the nasal bones appears chronic. Sinuses/Orbits: Small volume right maxillary and sphenoid hemosinus. Minimal thickening adjacent the lateral wall right orbital fracture. Globes are intact. Extraocular muscles are normal and symmetric. Other: None CT CERVICAL SPINE FINDINGS Alignment: Cervical stabilization collar is in place. Mild exaggeration of the normal cervical lordosis. No traumatic listhesis. Craniocervical and atlantoaxial alignment is maintained. Skull base and vertebrae: Partial collimation of the anteroinferior corner of C7. No acute fracture. No primary bone lesion or focal pathologic process. Degenerative changes at the atlantoaxial joint including a small corticated os odontoideum. Soft tissues and spinal canal: No pre or paravertebral fluid or swelling. No visible canal hematoma. Disc levels: Multilevel intervertebral disc height loss with spondylitic endplate changes. Disc osteophyte complexes, uncinate spurring and facet degenerative changes result in mild right and moderate left foraminal narrowing at both C5-6 and C6-7. Canal narrowing at these levels is at most mild. Upper chest: Biapical pleuroparenchymal scarring Other: Carotid artery vascular calcification in the. IMPRESSION: 1. 6 mm hyperdensity in the left frontal lobe concerning for parenchymal contusion with additional subarachnoid hyperattenuation along the anterior frontal poles and in the base of the right precentral sulcus. 2. Right malar soft tissue swelling and a fracture pattern involving the anteromedial walls right maxillary sinus, right lateral orbital wall, and possible diastasis of the right zygomaticomaxillary suture suspicious for a ZMC type fracture pattern. Consider further evaluation with  dedicated facial bone CT. 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 4. No acute cervical spine fracture or traumatic listhesis. 5. Multilevel cervical spondylitic changes maximal from C5 through C7, as detailed above. Critical Value/emergent results were called by telephone at the time of interpretation on 12/30/2018 at 9:10 pm to [place] , who verbally acknowledged these results. Electronically Signed   By: Lovena Le M.D.   On: 12/30/2018 21:10   Ct Cervical Spine Wo Contrast  Result Date: 12/30/2018 CLINICAL DATA:  Syncopal episode with head strike EXAM: CT HEAD WITHOUT CONTRAST CT CERVICAL SPINE WITHOUT CONTRAST TECHNIQUE: Multidetector CT imaging of the head and cervical spine was performed following the standard protocol without intravenous contrast. Multiplanar CT image reconstructions of the cervical spine were also generated. COMPARISON:  Brain MRI 05/21/2018, CT head 10/12/2009 FINDINGS: CT HEAD FINDINGS Brain: 6 mm focus of hyperattenuation in the left frontal lobe may reflect a small parenchymal contusion. Additional hyperattenuation is seen in the sulci of the anterior frontal poles and in the base of the right precentral sulcus suggesting some subarachnoid blood. No intraventricular hemorrhage. No mass effect or midline shift. No CT evidence of acute infarct. 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 Patchy areas of white matter hypoattenuation are most compatible with chronic microvascular angiopathy. Symmetric prominence of the ventricles, cisterns and sulci compatible with parenchymal volume loss. Vascular: Atherosclerotic calcification of the carotid siphons. No hyperdense vessel. Skull: Right malar soft tissue swelling. Minimally displaced fractures involving the anterior and lateral walls of the right maxillary  sinus. And lateral wall of the right  orbit. Question a small cortical step-off for mild diastasis of the right zygomaticomaxillary suture. No calvarial fracture or scalp hematoma. Fragmentation of the nasal bones appears chronic. Sinuses/Orbits: Small volume right maxillary and sphenoid hemosinus. Minimal thickening adjacent the lateral wall right orbital fracture. Globes are intact. Extraocular muscles are normal and symmetric. Other: None CT CERVICAL SPINE FINDINGS Alignment: Cervical stabilization collar is in place. Mild exaggeration of the normal cervical lordosis. No traumatic listhesis. Craniocervical and atlantoaxial alignment is maintained. Skull base and vertebrae: Partial collimation of the anteroinferior corner of C7. No acute fracture. No primary bone lesion or focal pathologic process. Degenerative changes at the atlantoaxial joint including a small corticated os odontoideum. Soft tissues and spinal canal: No pre or paravertebral fluid or swelling. No visible canal hematoma. Disc levels: Multilevel intervertebral disc height loss with spondylitic endplate changes. Disc osteophyte complexes, uncinate spurring and facet degenerative changes result in mild right and moderate left foraminal narrowing at both C5-6 and C6-7. Canal narrowing at these levels is at most mild. Upper chest: Biapical pleuroparenchymal scarring Other: Carotid artery vascular calcification in the. IMPRESSION: 1. 6 mm hyperdensity in the left frontal lobe concerning for parenchymal contusion with additional subarachnoid hyperattenuation along the anterior frontal poles and in the base of the right precentral sulcus. 2. Right malar soft tissue swelling and a fracture pattern involving the anteromedial walls right maxillary sinus, right lateral orbital wall, and possible diastasis of the right zygomaticomaxillary suture suspicious for a ZMC type fracture pattern. Consider further evaluation with dedicated facial bone CT. Small amount of associated right maxillary and  sphenoid hemosinus 3. 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 4. No acute cervical spine fracture or traumatic listhesis. 5. Multilevel cervical spondylitic changes maximal from C5 through C7, as detailed above. Critical Value/emergent results were called by telephone at the time of interpretation on 12/30/2018 at 9:10 pm to [place] , who verbally acknowledged these results. Electronically Signed   By: Lovena Le M.D.   On: 12/30/2018 21:10   Ct Maxillofacial Wo Contrast  Result Date: 12/31/2018 CLINICAL DATA:  73 year old male with facial trauma. EXAM: CT MAXILLOFACIAL WITHOUT CONTRAST TECHNIQUE: Multidetector CT imaging of the maxillofacial structures was performed. Multiplanar CT image reconstructions were also generated. COMPARISON:  Head CT dated 12/30/2018 FINDINGS: Osseous: Minimally displaced fracture of the anterior wall of the right maxillary sinus and minimally angulated fracture of the posterolateral wall of the right maxillary sinus. There is a minimally depressed fracture of the right orbital floor. Minimally displaced fracture or sutural dehiscence of the lateral wall of the right orbit. There is irregularity of the tip of the nasal bone concerning for a minimally displaced fracture. No mandibular dislocation. Orbits: The globes and retro-orbital fat are preserved. Sinuses: Probable blood product in the right maxillary sinus. Mild mucoperiosteal thickening of ethmoid air cells and right sphenoid sinus. The mastoid air cells are clear. Soft tissues: Right forehead and periorbital contusion. Limited intracranial: No significant or unexpected finding. IMPRESSION: Fractures of the right maxillary sinus and orbital wall as described as well as fracture of the tip of the nasal bone. Electronically Signed   By: Anner Crete M.D.   On: 12/31/2018 00:41    Procedures Procedures (including critical care  time)  Medications Ordered in ED Medications  sodium chloride flush (NS) 0.9 % injection 3 mL (has no administration in time range)  lactated ringers bolus 500 mL (0 mLs  Intravenous Stopped 12/30/18 2152)  lactated ringers bolus 500 mL (0 mLs Intravenous Stopped 12/31/18 0005)     Initial Impression / Assessment and Plan / ED Course  I have reviewed the triage vital signs and the nursing notes.  Pertinent labs & imaging results that were available during my care of the patient were reviewed by me and considered in my medical decision making (see chart for details).        Bruce [name] is a 73 y.o. male  reportedly fell as a ground level fall outside of a bar onto gravel when he was visiting a friend. Syncope reason unknown at this time. Imaging and labs ordered. Differential diagnosis of syncope includes electrolyte abnormality, drug intoxication, sepsis, anemia. Patient denies need for pain control at this time. Doubt ACS.   Labs showed an AKI. Gentle hydration initiated. UDS positive for marijuana. Imaging showed a 8mm frontal lobe contusion and possible SAH. Multiple closed facial fractures present. CT face ordered. Remove CVA present. Doubt acute CVA at this time. Neurosurgery consulted, but they have not responded. Multiple pages were made to no response (Pages ordered 2351 and [id number]). Trauma consulted, and they recommend medicine admission for syncope workup. ENT consulted regarding facial fractures, and they will see patient in the morning.  Care of patient was discussed with the supervising attending.  Final Clinical Impressions(s) / ED Diagnoses   Final diagnoses:  AKI (acute kidney injury) ([place])  Fall, initial encounter  Multiple closed fractures of facial bone, initial encounter ([place])  Contusion of frontal lobe with loss of consciousness of 30 minutes or less, unspecified laterality, initial encounter [facility])  Hull (subarachnoid hemorrhage) [facility])    ED Discharge Orders     None         Julianne Rice, MD 12/31/18 Layla Maw    Lajean Saver, MD 01/02/19 1416

## 2018-12-31 ENCOUNTER — Inpatient Hospital Stay (HOSPITAL_COMMUNITY): Payer: Medicare Other

## 2018-12-31 ENCOUNTER — Emergency Department (HOSPITAL_COMMUNITY): Payer: Medicare Other

## 2018-12-31 ENCOUNTER — Encounter (HOSPITAL_COMMUNITY): Payer: Self-pay | Admitting: Internal Medicine

## 2018-12-31 DIAGNOSIS — J449 Chronic obstructive pulmonary disease, unspecified: Secondary | ICD-10-CM | POA: Diagnosis present

## 2018-12-31 DIAGNOSIS — I420 Dilated cardiomyopathy: Secondary | ICD-10-CM | POA: Diagnosis not present

## 2018-12-31 DIAGNOSIS — S0240CA Maxillary fracture, right side, initial encounter for closed fracture: Secondary | ICD-10-CM | POA: Diagnosis present

## 2018-12-31 DIAGNOSIS — R55 Syncope and collapse: Secondary | ICD-10-CM | POA: Diagnosis not present

## 2018-12-31 DIAGNOSIS — S066X1A Traumatic subarachnoid hemorrhage with loss of consciousness of 30 minutes or less, initial encounter: Secondary | ICD-10-CM | POA: Diagnosis present

## 2018-12-31 DIAGNOSIS — I2510 Atherosclerotic heart disease of native coronary artery without angina pectoris: Secondary | ICD-10-CM

## 2018-12-31 DIAGNOSIS — I10 Essential (primary) hypertension: Secondary | ICD-10-CM | POA: Diagnosis not present

## 2018-12-31 DIAGNOSIS — S0285XA Fracture of orbit, unspecified, initial encounter for closed fracture: Secondary | ICD-10-CM | POA: Diagnosis present

## 2018-12-31 DIAGNOSIS — I629 Nontraumatic intracranial hemorrhage, unspecified: Secondary | ICD-10-CM

## 2018-12-31 DIAGNOSIS — Z20828 Contact with and (suspected) exposure to other viral communicable diseases: Secondary | ICD-10-CM | POA: Diagnosis present

## 2018-12-31 DIAGNOSIS — S06331A Contusion and laceration of cerebrum, unspecified, with loss of consciousness of 30 minutes or less, initial encounter: Secondary | ICD-10-CM | POA: Insufficient documentation

## 2018-12-31 DIAGNOSIS — K219 Gastro-esophageal reflux disease without esophagitis: Secondary | ICD-10-CM | POA: Diagnosis present

## 2018-12-31 DIAGNOSIS — M1990 Unspecified osteoarthritis, unspecified site: Secondary | ICD-10-CM | POA: Diagnosis present

## 2018-12-31 DIAGNOSIS — I5022 Chronic systolic (congestive) heart failure: Secondary | ICD-10-CM | POA: Diagnosis present

## 2018-12-31 DIAGNOSIS — I252 Old myocardial infarction: Secondary | ICD-10-CM | POA: Diagnosis not present

## 2018-12-31 DIAGNOSIS — N179 Acute kidney failure, unspecified: Secondary | ICD-10-CM | POA: Diagnosis present

## 2018-12-31 DIAGNOSIS — I739 Peripheral vascular disease, unspecified: Secondary | ICD-10-CM | POA: Diagnosis present

## 2018-12-31 DIAGNOSIS — S0292XA Unspecified fracture of facial bones, initial encounter for closed fracture: Secondary | ICD-10-CM | POA: Insufficient documentation

## 2018-12-31 DIAGNOSIS — S022XXA Fracture of nasal bones, initial encounter for closed fracture: Secondary | ICD-10-CM | POA: Diagnosis present

## 2018-12-31 DIAGNOSIS — I2583 Coronary atherosclerosis due to lipid rich plaque: Secondary | ICD-10-CM

## 2018-12-31 DIAGNOSIS — S0633AA Contusion and laceration of cerebrum, unspecified, with loss of consciousness status unknown, initial encounter: Secondary | ICD-10-CM | POA: Insufficient documentation

## 2018-12-31 DIAGNOSIS — R402362 Coma scale, best motor response, obeys commands, at arrival to emergency department: Secondary | ICD-10-CM | POA: Diagnosis present

## 2018-12-31 DIAGNOSIS — S065X1A Traumatic subdural hemorrhage with loss of consciousness of 30 minutes or less, initial encounter: Secondary | ICD-10-CM | POA: Diagnosis present

## 2018-12-31 DIAGNOSIS — F419 Anxiety disorder, unspecified: Secondary | ICD-10-CM | POA: Diagnosis present

## 2018-12-31 DIAGNOSIS — R402252 Coma scale, best verbal response, oriented, at arrival to emergency department: Secondary | ICD-10-CM | POA: Diagnosis present

## 2018-12-31 DIAGNOSIS — I609 Nontraumatic subarachnoid hemorrhage, unspecified: Secondary | ICD-10-CM | POA: Diagnosis present

## 2018-12-31 DIAGNOSIS — I110 Hypertensive heart disease with heart failure: Secondary | ICD-10-CM | POA: Diagnosis present

## 2018-12-31 DIAGNOSIS — I480 Paroxysmal atrial fibrillation: Secondary | ICD-10-CM | POA: Diagnosis present

## 2018-12-31 DIAGNOSIS — E785 Hyperlipidemia, unspecified: Secondary | ICD-10-CM | POA: Diagnosis present

## 2018-12-31 DIAGNOSIS — I951 Orthostatic hypotension: Secondary | ICD-10-CM | POA: Diagnosis present

## 2018-12-31 DIAGNOSIS — W010XXA Fall on same level from slipping, tripping and stumbling without subsequent striking against object, initial encounter: Secondary | ICD-10-CM | POA: Diagnosis present

## 2018-12-31 DIAGNOSIS — S06339A Contusion and laceration of cerebrum, unspecified, with loss of consciousness of unspecified duration, initial encounter: Secondary | ICD-10-CM | POA: Insufficient documentation

## 2018-12-31 DIAGNOSIS — R402142 Coma scale, eyes open, spontaneous, at arrival to emergency department: Secondary | ICD-10-CM | POA: Diagnosis present

## 2018-12-31 DIAGNOSIS — F329 Major depressive disorder, single episode, unspecified: Secondary | ICD-10-CM | POA: Diagnosis present

## 2018-12-31 DIAGNOSIS — F17210 Nicotine dependence, cigarettes, uncomplicated: Secondary | ICD-10-CM | POA: Diagnosis present

## 2018-12-31 HISTORY — DX: Nontraumatic intracranial hemorrhage, unspecified: I62.9

## 2018-12-31 LAB — COMPREHENSIVE METABOLIC PANEL
ALT: 28 U/L (ref 0–44)
AST: 30 U/L (ref 15–41)
Albumin: 3.6 g/dL (ref 3.5–5.0)
Alkaline Phosphatase: 70 U/L (ref 38–126)
Anion gap: 7 (ref 5–15)
BUN: 10 mg/dL (ref 8–23)
CO2: 23 mmol/L (ref 22–32)
Calcium: 9.4 mg/dL (ref 8.9–10.3)
Chloride: 110 mmol/L (ref 98–111)
Creatinine, Ser: 1.25 mg/dL — ABNORMAL HIGH (ref 0.61–1.24)
GFR calc Af Amer: >60 mL/min (ref >60)
GFR calc non Af Amer: 58 mL/min — ABNORMAL LOW (ref >60)
Glucose, Bld: 108 mg/dL — ABNORMAL HIGH (ref 70–99)
Potassium: 4.1 mmol/L (ref 3.5–5.1)
Sodium: 140 mmol/L (ref 135–145)
Total Bilirubin: 0.8 mg/dL (ref 0.3–1.2)
Total Protein: 6.4 g/dL — ABNORMAL LOW (ref 6.5–8.1)

## 2018-12-31 LAB — CBC WITH DIFFERENTIAL/PLATELET
Abs Immature Granulocytes: 0.02 10*3/uL (ref 0.00–0.07)
Basophils Absolute: 0 10*3/uL (ref 0.0–0.1)
Basophils Relative: 0 %
Eosinophils Absolute: 0.1 10*3/uL (ref 0.0–0.5)
Eosinophils Relative: 1 %
HCT: 40.8 % (ref 39.0–52.0)
Hemoglobin: 13.5 g/dL (ref 13.0–17.0)
Immature Granulocytes: 0 %
Lymphocytes Relative: 14 %
Lymphs Abs: 1 10*3/uL (ref 0.7–4.0)
MCH: 32.6 pg (ref 26.0–34.0)
MCHC: 33.1 g/dL (ref 30.0–36.0)
MCV: 98.6 fL (ref 80.0–100.0)
Monocytes Absolute: 0.6 10*3/uL (ref 0.1–1.0)
Monocytes Relative: 8 %
Neutro Abs: 5.5 10*3/uL (ref 1.7–7.7)
Neutrophils Relative %: 77 %
Platelets: 203 10*3/uL (ref 150–400)
RBC: 4.14 MIL/uL — ABNORMAL LOW (ref 4.22–5.81)
RDW: 12.9 % (ref 11.5–15.5)
WBC: 7.2 10*3/uL (ref 4.0–10.5)
nRBC: 0 % (ref 0.0–0.2)

## 2018-12-31 LAB — ECHOCARDIOGRAM COMPLETE
Height: 73 in
Weight: 2848 oz

## 2018-12-31 LAB — TROPONIN I (HIGH SENSITIVITY)
Troponin I (High Sensitivity): 8 ng/L (ref <18)
Troponin I (High Sensitivity): 9 ng/L (ref <18)

## 2018-12-31 LAB — SARS CORONAVIRUS 2 (TAT 6-24 HRS): SARS Coronavirus 2: NEGATIVE

## 2018-12-31 LAB — MAGNESIUM: Magnesium: 2 mg/dL (ref 1.7–2.4)

## 2018-12-31 MED ORDER — ACETAMINOPHEN 650 MG RE SUPP: 650.0000 mg | Freq: Four times a day (QID) | RECTAL | Status: DC | PRN

## 2018-12-31 MED ORDER — TRAZODONE HCL 50 MG PO TABS: 150.0000 mg | ORAL_TABLET | Freq: Every day | ORAL | Status: DC

## 2018-12-31 MED ORDER — ALBUTEROL SULFATE (2.5 MG/3ML) 0.083% IN NEBU: 2.5000 mg | INHALATION_SOLUTION | Freq: Four times a day (QID) | RESPIRATORY_TRACT | Status: DC | PRN

## 2018-12-31 MED ORDER — TAMSULOSIN HCL 0.4 MG PO CAPS
0.4000 mg | ORAL_CAPSULE | Freq: Every day | ORAL | Status: DC
  Administered 2018-12-31 – 2019-01-01 (×2): 0.4 mg via ORAL
  Filled 2018-12-31 (×2): qty 1

## 2018-12-31 MED ORDER — MONTELUKAST SODIUM 10 MG PO TABS
10.0000 mg | ORAL_TABLET | Freq: Every day | ORAL | Status: DC
  Administered 2018-12-31 – 2019-01-01 (×2): 10 mg via ORAL
  Filled 2018-12-31 (×3): qty 1

## 2018-12-31 MED ORDER — ONDANSETRON HCL 4 MG PO TABS: 4.0000 mg | ORAL_TABLET | Freq: Four times a day (QID) | ORAL | Status: DC | PRN

## 2018-12-31 MED ORDER — CARVEDILOL 12.5 MG PO TABS
12.5000 mg | ORAL_TABLET | Freq: Two times a day (BID) | ORAL | Status: DC
  Administered 2018-12-31 – 2019-01-02 (×5): 12.5 mg via ORAL
  Filled 2018-12-31 (×5): qty 1

## 2018-12-31 MED ORDER — ONDANSETRON HCL 4 MG/2ML IJ SOLN: 4.0000 mg | Freq: Four times a day (QID) | INTRAMUSCULAR | Status: DC | PRN

## 2018-12-31 MED ORDER — ALLOPURINOL 100 MG PO TABS
100.0000 mg | ORAL_TABLET | Freq: Every day | ORAL | Status: DC
  Administered 2018-12-31 – 2019-01-01 (×2): 100 mg via ORAL
  Filled 2018-12-31 (×3): qty 1

## 2018-12-31 MED ORDER — EZETIMIBE 10 MG PO TABS
10.0000 mg | ORAL_TABLET | Freq: Every day | ORAL | Status: DC
  Administered 2018-12-31 – 2019-01-02 (×3): 10 mg via ORAL
  Filled 2018-12-31 (×3): qty 1

## 2018-12-31 MED ORDER — ATORVASTATIN CALCIUM 80 MG PO TABS
80.0000 mg | ORAL_TABLET | Freq: Every day | ORAL | Status: DC
  Administered 2018-12-31 – 2019-01-01 (×2): 80 mg via ORAL
  Filled 2018-12-31 (×2): qty 1

## 2018-12-31 MED ORDER — HYDROCODONE-ACETAMINOPHEN 5-325 MG PO TABS
1.0000 | ORAL_TABLET | Freq: Four times a day (QID) | ORAL | Status: DC | PRN
  Administered 2018-12-31 – 2019-01-02 (×3): 1 via ORAL
  Filled 2018-12-31 (×3): qty 1

## 2018-12-31 MED ORDER — RANOLAZINE ER 500 MG PO TB12
1000.0000 mg | ORAL_TABLET | Freq: Every day | ORAL | Status: DC
  Administered 2018-12-31: 1000 mg via ORAL
  Filled 2018-12-31: qty 2

## 2018-12-31 MED ORDER — MIRTAZAPINE 15 MG PO TABS: 15.0000 mg | ORAL_TABLET | Freq: Every day | ORAL | Status: DC

## 2018-12-31 MED ORDER — ROPINIROLE HCL 1 MG PO TABS
1.0000 mg | ORAL_TABLET | Freq: Every day | ORAL | Status: DC
  Administered 2018-12-31 – 2019-01-01 (×2): 1 mg via ORAL
  Filled 2018-12-31 (×3): qty 1

## 2018-12-31 MED ORDER — NITROGLYCERIN 0.4 MG SL SUBL: 0.4000 mg | SUBLINGUAL_TABLET | SUBLINGUAL | Status: DC | PRN

## 2018-12-31 MED ORDER — CITALOPRAM HYDROBROMIDE 10 MG PO TABS
40.0000 mg | ORAL_TABLET | Freq: Every day | ORAL | Status: DC
  Administered 2018-12-31: 40 mg via ORAL
  Filled 2018-12-31: qty 4

## 2018-12-31 MED ORDER — ACETAMINOPHEN 325 MG PO TABS: 650.0000 mg | ORAL_TABLET | Freq: Four times a day (QID) | ORAL | Status: DC | PRN

## 2018-12-31 NOTE — Consult Note (Signed)
ENT/FACIAL TRAUMA CONSULT:  Reason for Consult: Facial trauma with right ZMC fracture Referring Physician:  Trauma Service  Bruce [name] is an 73 y.o. male.  HPI: Patient admitted to [facility] after suffering a ground fall.  Etiology unclear, positive loss of consciousness at the time.  Patient was transported to [name] ER for evaluation.  He was found to have a frontal lobe contusion and possible subarachnoid hemorrhage.  Patient admitted to the hospital for syncopal work-up.  Maxillofacial CT scan showed nondisplaced right ZMC fracture.  Patient has moderate right periorbital swelling and some discomfort.  No laceration, epistaxis or other acute finding.  He denies visual change, trismus or difficulty chewing.  Past Medical History:  Diagnosis Date  . Anginal pain ([place])    Left side if chest ,NTG  relieves chaes apin 12/01/13  . Anxiety   . Arthritis   . Cancer [facility])    colon cancer  . CHF (congestive heart failure) ([place])   . 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 Complication of anesthesia    wake up with a head ache  . COPD (chronic obstructive pulmonary disease) ([place])   . Coronary artery disease   . GERD (gastroesophageal reflux disease)   . Headache    related to sinus congestion  . History of blood transfusion   . History of kidney stones   . Hypertension   . Myocardial infarction [facility])    '09 AND '12  . Peripheral vascular disease ([place])   . Shortness of breath    With exertion .  Marland Kitchen UTI (urinary tract infection)    frequent UTI    Past Surgical History:  Procedure Laterality Date  . CARDIAC CATHETERIZATION    . CHOLECYSTECTOMY    . EXTERNAL FIXATION LEG Right 10/06/2013   Procedure: CLOSED REDUCTION RIGHT TIBIAL PLATEAU FRACTURE, EXTERNAL FIXATION RIGHT LEG, PLACEMENT OF WOUND VAC;  Surgeon: Rozanna Box, MD;  Location: [place];  Service: Orthopedics;  Laterality: Right;  . FEMORAL-POPLITEAL BYPASS GRAFT Right  10/06/2013   Procedure: RIGHT POPLITEAL-POPLITEAL ARTERY BYPASS GRAFT;  Surgeon: Rosetta Posner, MD;  Location: [place];  Service: Vascular;  Laterality: Right;  . FRACTURE SURGERY Right 2014  . HARDWARE REMOVAL Right 12/02/2013   Procedure: REMOVAL EXTERNAL FIXATION RIGHT LEG ;  Surgeon: Rozanna Box, MD;  Location: [place];  Service: Orthopedics;  Laterality: Right;  . HERNIA REPAIR Right 1990's  . I&D EXTREMITY Right 10/09/2013   Procedure: IRRIGATION AND DEBRIDEMENT RIGHT LEG, CLOSURE  OF WOUNDS, PLACEMENT OF WOUND VAC ON EACH SIDE OF LEG;  Surgeon: Rozanna Box, MD;  Location: [place];  Service: Orthopedics;  Laterality: Right;  . IR NEPHROSTOMY PLACEMENT LEFT  06/19/2016  . IVC filter  2009   placed @ [facility]/ Removed in 2010.  . IVC Filter Removed    . LEFT HEART CATH AND CORONARY ANGIOGRAPHY Right 10/11/2018   Procedure: LEFT HEART CATH AND CORONARY ANGIOGRAPHY;  Surgeon: Dionisio Brinlyn Cena, MD;  Location: [place] [facility];  Service: Cardiovascular;  Laterality: Right;  . ligament leg Left   . NEPHROLITHOTOMY Left 06/19/2016   Procedure: NEPHROLITHOTOMY PERCUTANEOUS;  Surgeon: Hollice Espy, MD;  Location: [facility];  Service: Urology;  Laterality: Left;  . ORIF TIBIA FRACTURE Right 12/29/2014   Procedure: OPEN REDUCTION INTERNAL FIXATION (ORIF) RIGHT TIBIA FRACTURE, RIA VS ICBG;  Surgeon: Altamese Westover, MD;  Location: [place];  Service: Orthopedics;  Laterality: Right;  . RADIOACTIVE  SEED IMPLANT N/A 09/12/2016   Procedure: RADIOACTIVE SEED IMPLANT/BRACHYTHERAPY IMPLANT;  Surgeon: Hollice Espy, MD;  Location: [facility];  Service: Urology;  Laterality: N/A;    Family History  Problem Relation Age of Onset  . Hypertension Mother   . Prostate cancer Brother   . Mental illness Neg Hx     Social History:  reports that he has been smoking. He has a 26.00 pack-year smoking history. He has never used smokeless tobacco. He reports that he does not drink alcohol or use drugs.  Allergies:  Allergies   Allergen Reactions  . Adhesive [Tape] Other (See Comments)    After right leg fracture surgery, pt developed a large blister where tape was applied to his right leg. OK to use paper tape.  . Imdur [Isosorbide Dinitrate] Other (See Comments)    hallucinations  . Singulair [Montelukast Sodium] Other (See Comments)    Hallucinations     Medications: I have reviewed the patient's current medications.  Results for orders placed or performed during the hospital encounter of 12/30/18 (from the past 48 hour(s))  SARS CORONAVIRUS 2 (TAT 6-24 HRS) Nasopharyngeal Nasopharyngeal Swab     Status: None   Collection Time: 12/30/18 12:04 AM   Specimen: Nasopharyngeal Swab  Result Value Ref Range   SARS Coronavirus 2 NEGATIVE NEGATIVE    Comment: (NOTE) SARS-CoV-2 target nucleic acids are NOT DETECTED. The SARS-CoV-2 RNA is generally detectable in upper and lower respiratory specimens during the acute phase of infection. Negative results do not preclude SARS-CoV-2 infection, do not rule out co-infections with other pathogens, and should not be used as the sole basis for treatment or other patient management decisions. Negative results must be combined with clinical observations, patient history, and epidemiological information. The expected result is Negative. Fact Sheet for Patients: SugarRoll.be Fact Sheet for Healthcare Providers: https://www.woods-mathews.com/ This test is not yet approved or cleared by the [place] FDA and  has been authorized for detection and/or diagnosis of SARS-CoV-2 by FDA under an Emergency Use Authorization (EUA). This EUA will remain  in effect (meaning this test can be used) for the duration of the COVID-19 declaration under Section 56 4(b)(1) of the Act, 21 U.S.C. section 360bbb-3(b)(1), unless the authorization is terminated or revoked sooner. Performed at [facility], [place] [address] [place],  [place] [id number]   Basic metabolic panel     Status: Abnormal   Collection Time: 12/30/18  4:40 PM  Result Value Ref Range   Sodium 137 135 - 145 mmol/L   Potassium 4.0 3.5 - 5.1 mmol/L   Chloride 105 98 - 111 mmol/L   CO2 23 22 - 32 mmol/L   Glucose, Bld 172 (H) 70 - 99 mg/dL   BUN 12 8 - 23 mg/dL   Creatinine, Ser 1.58 (H) 0.61 - 1.24 mg/dL   Calcium 9.1 8.9 - 10.3 mg/dL   GFR calc non Af Amer 43 (L) >60 mL/min   GFR calc Af Amer 50 (L) >60 mL/min   Anion gap 9 5 - 15    Comment: Performed at [facility], [place] [address], [place] [address]  CBC     Status: Abnormal   Collection Time: 12/30/18  4:40 PM  Result Value Ref Range   WBC 9.9 4.0 - 10.5 K/uL   RBC 3.93 (L) 4.22 - 5.81 MIL/uL   Hemoglobin 13.0 13.0 - 17.0 g/dL   HCT 38.8 (L) 39.0 - 52.0 %   MCV 98.7 80.0 -  100.0 fL   MCH 33.1 26.0 - 34.0 pg   MCHC 33.5 30.0 - 36.0 g/dL   RDW 12.7 11.5 - 15.5 %   Platelets 197 150 - 400 K/uL   nRBC 0.0 0.0 - 0.2 %    Comment: Performed at [facility], [place] [address] [place], [place] [address]  Protime-INR     Status: Abnormal   Collection Time: 12/30/18  5:13 PM  Result Value Ref Range   Prothrombin Time 22.3 (H) 11.4 - 15.2 seconds   INR 2.0 (H) 0.8 - 1.2    Comment: (NOTE) INR goal varies based on device and disease states. Performed at [facility], [place] [address] [place], [place] [address]   Urinalysis, Routine w reflex microscopic     Status: Abnormal   Collection Time: 12/30/18  8:15 PM  Result Value Ref Range   Color, Urine YELLOW YELLOW   APPearance CLEAR CLEAR   Specific Gravity, Urine 1.020 1.005 - 1.030   pH 5.5 5.0 - 8.0   Glucose, UA NEGATIVE NEGATIVE mg/dL   Hgb urine dipstick TRACE (A) NEGATIVE   Bilirubin Urine NEGATIVE NEGATIVE   Ketones, ur NEGATIVE NEGATIVE mg/dL   Protein, ur NEGATIVE NEGATIVE mg/dL   Nitrite NEGATIVE NEGATIVE   Leukocytes,Ua NEGATIVE NEGATIVE    Comment: Performed at [facility] [address]  [place], [place] [address]  Urinalysis, Microscopic (reflex)     Status: Abnormal   Collection Time: 12/30/18  8:15 PM  Result Value Ref Range   RBC / HPF 0-5 0 - 5 RBC/hpf   WBC, UA 0-5 0 - 5 WBC/hpf   Bacteria, UA RARE (A) NONE SEEN   Squamous Epithelial / LPF 0-5 0 - 5    Comment: Performed at [facility], [place] [address], [place], [place] [address]  Rapid urine drug screen (hospital performed)     Status: Abnormal   Collection Time: 12/30/18  8:15 PM  Result Value Ref Range   Opiates NONE DETECTED NONE DETECTED   Cocaine NONE DETECTED NONE DETECTED   Benzodiazepines NONE DETECTED NONE DETECTED   Amphetamines NONE DETECTED NONE DETECTED   Tetrahydrocannabinol POSITIVE (A) NONE DETECTED   Barbiturates NONE DETECTED NONE DETECTED    Comment: (NOTE) DRUG SCREEN FOR MEDICAL PURPOSES ONLY.  IF CONFIRMATION IS NEEDED FOR ANY PURPOSE, NOTIFY LAB WITHIN 5 DAYS. LOWEST DETECTABLE LIMITS FOR URINE DRUG SCREEN Drug Class                     Cutoff (ng/mL) Amphetamine and metabolites    1000 Barbiturate and metabolites    200 Benzodiazepine                 [id number] Tricyclics and metabolites     300 Opiates and metabolites        300 Cocaine and metabolites        300 THC                            50 Performed at [facility], [place] [address] [place], [place] [address]   Comprehensive metabolic panel     Status: Abnormal   Collection Time: 12/31/18  5:51 AM  Result Value Ref Range   Sodium 140 135 - 145 mmol/L   Potassium 4.1 3.5 - 5.1 mmol/L   Chloride 110 98 - 111 mmol/L   CO2 23 22 - 32  mmol/L   Glucose, Bld 108 (H) 70 - 99 mg/dL   BUN 10 8 - 23 mg/dL   Creatinine, Ser 1.25 (H) 0.61 - 1.24 mg/dL   Calcium 9.4 8.9 - 10.3 mg/dL   Total Protein 6.4 (L) 6.5 - 8.1 g/dL   Albumin 3.6 3.5 - 5.0 g/dL   AST 30 15 - 41 U/L   ALT 28 0 - 44 U/L   Alkaline Phosphatase 70 38 - 126 U/L   Total Bilirubin 0.8 0.3 - 1.2 mg/dL   GFR calc non Af Amer 58 (L) >60 mL/min   GFR calc Af Amer  >60 >60 mL/min   Anion gap 7 5 - 15    Comment: Performed at [facility] [address], [place], [place] [address]  Magnesium     Status: None   Collection Time: 12/31/18  5:51 AM  Result Value Ref Range   Magnesium 2.0 1.7 - 2.4 mg/dL    Comment: Performed at [facility] [address] [place], [place] [address]  CBC WITH DIFFERENTIAL     Status: Abnormal   Collection Time: 12/31/18  5:51 AM  Result Value Ref Range   WBC 7.2 4.0 - 10.5 K/uL   RBC 4.14 (L) 4.22 - 5.81 MIL/uL   Hemoglobin 13.5 13.0 - 17.0 g/dL   HCT 40.8 39.0 - 52.0 %   MCV 98.6 80.0 - 100.0 fL   MCH 32.6 26.0 - 34.0 pg   MCHC 33.1 30.0 - 36.0 g/dL   RDW 12.9 11.5 - 15.5 %   Platelets 203 150 - 400 K/uL   nRBC 0.0 0.0 - 0.2 %   Neutrophils Relative % 77 %   Neutro Abs 5.5 1.7 - 7.7 K/uL   Lymphocytes Relative 14 %   Lymphs Abs 1.0 0.7 - 4.0 K/uL   Monocytes Relative 8 %   Monocytes Absolute 0.6 0.1 - 1.0 K/uL   Eosinophils Relative 1 %   Eosinophils Absolute 0.1 0.0 - 0.5 K/uL   Basophils Relative 0 %   Basophils Absolute 0.0 0.0 - 0.1 K/uL   Immature Granulocytes 0 %   Abs Immature Granulocytes 0.02 0.00 - 0.07 K/uL    Comment: Performed at [facility] [address] [place], [place] [address]    Ct Head Wo Contrast  Result Date: 12/30/2018 CLINICAL DATA:  Syncopal episode with head strike EXAM: CT HEAD WITHOUT CONTRAST CT CERVICAL SPINE WITHOUT CONTRAST TECHNIQUE: Multidetector CT imaging of the head and cervical spine was performed following the standard protocol without intravenous contrast. Multiplanar CT image reconstructions of the cervical spine were also generated. COMPARISON:  Brain MRI 05/21/2018, CT head 10/12/2009 FINDINGS: CT HEAD FINDINGS Brain: 6 mm focus of hyperattenuation in the left frontal lobe may reflect a small parenchymal contusion. Additional hyperattenuation is seen in the sulci of the anterior frontal poles and in the base of the right precentral sulcus  suggesting some subarachnoid blood. No intraventricular hemorrhage. No mass effect or midline shift. No CT evidence of acute infarct. 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 Patchy areas of white matter hypoattenuation are most compatible with chronic microvascular angiopathy. Symmetric prominence of the ventricles, cisterns and sulci compatible with parenchymal volume loss. Vascular: Atherosclerotic calcification of the carotid siphons. No hyperdense vessel. Skull: Right malar soft tissue swelling. Minimally displaced fractures involving the anterior and lateral walls of the right maxillary sinus. And lateral wall of the right orbit. Question a small cortical step-off for mild diastasis  of the right zygomaticomaxillary suture. No calvarial fracture or scalp hematoma. Fragmentation of the nasal bones appears chronic. Sinuses/Orbits: Small volume right maxillary and sphenoid hemosinus. Minimal thickening adjacent the lateral wall right orbital fracture. Globes are intact. Extraocular muscles are normal and symmetric. Other: None CT CERVICAL SPINE FINDINGS Alignment: Cervical stabilization collar is in place. Mild exaggeration of the normal cervical lordosis. No traumatic listhesis. Craniocervical and atlantoaxial alignment is maintained. Skull base and vertebrae: Partial collimation of the anteroinferior corner of C7. No acute fracture. No primary bone lesion or focal pathologic process. Degenerative changes at the atlantoaxial joint including a small corticated os odontoideum. Soft tissues and spinal canal: No pre or paravertebral fluid or swelling. No visible canal hematoma. Disc levels: Multilevel intervertebral disc height loss with spondylitic endplate changes. Disc osteophyte complexes, uncinate spurring and facet degenerative changes result in mild right and moderate left foraminal narrowing at both C5-6 and C6-7. Canal narrowing at these levels is at most mild. Upper chest: Biapical pleuroparenchymal scarring  Other: Carotid artery vascular calcification in the. IMPRESSION: 1. 6 mm hyperdensity in the left frontal lobe concerning for parenchymal contusion with additional subarachnoid hyperattenuation along the anterior frontal poles and in the base of the right precentral sulcus. 2. Right malar soft tissue swelling and a fracture pattern involving the anteromedial walls right maxillary sinus, right lateral orbital wall, and possible diastasis of the right zygomaticomaxillary suture suspicious for a ZMC type fracture pattern. Consider further evaluation with dedicated facial bone CT. 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 4. No acute cervical spine fracture or traumatic listhesis. 5. Multilevel cervical spondylitic changes maximal from C5 through C7, as detailed above. Critical Value/emergent results were called by telephone at the time of interpretation on 12/30/2018 at 9:10 pm to [place] , who verbally acknowledged these results. Electronically Signed   By: Lovena Le M.D.   On: 12/30/2018 21:10   Ct Cervical Spine Wo Contrast  Result Date: 12/30/2018 CLINICAL DATA:  Syncopal episode with head strike EXAM: CT HEAD WITHOUT CONTRAST CT CERVICAL SPINE WITHOUT CONTRAST TECHNIQUE: Multidetector CT imaging of the head and cervical spine was performed following the standard protocol without intravenous contrast. Multiplanar CT image reconstructions of the cervical spine were also generated. COMPARISON:  Brain MRI 05/21/2018, CT head 10/12/2009 FINDINGS: CT HEAD FINDINGS Brain: 6 mm focus of hyperattenuation in the left frontal lobe may reflect a small parenchymal contusion. Additional hyperattenuation is seen in the sulci of the anterior frontal poles and in the base of the right precentral sulcus suggesting some subarachnoid blood. No intraventricular hemorrhage. No mass  effect or midline shift. No CT evidence of acute infarct. 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 Patchy areas of white matter hypoattenuation are most compatible with chronic microvascular angiopathy. Symmetric prominence of the ventricles, cisterns and sulci compatible with parenchymal volume loss. Vascular: Atherosclerotic calcification of the carotid siphons. No hyperdense vessel. Skull: Right malar soft tissue swelling. Minimally displaced fractures involving the anterior and lateral walls of the right maxillary sinus. And lateral wall of the right orbit. Question a small cortical step-off for mild diastasis of the right zygomaticomaxillary suture. No calvarial fracture or scalp hematoma. Fragmentation of the nasal bones appears chronic. Sinuses/Orbits: Small volume right maxillary and sphenoid hemosinus. Minimal thickening adjacent the lateral wall right orbital fracture. Globes are intact. Extraocular muscles are normal and symmetric. Other: None CT CERVICAL SPINE FINDINGS Alignment: Cervical stabilization  collar is in place. Mild exaggeration of the normal cervical lordosis. No traumatic listhesis. Craniocervical and atlantoaxial alignment is maintained. Skull base and vertebrae: Partial collimation of the anteroinferior corner of C7. No acute fracture. No primary bone lesion or focal pathologic process. Degenerative changes at the atlantoaxial joint including a small corticated os odontoideum. Soft tissues and spinal canal: No pre or paravertebral fluid or swelling. No visible canal hematoma. Disc levels: Multilevel intervertebral disc height loss with spondylitic endplate changes. Disc osteophyte complexes, uncinate spurring and facet degenerative changes result in mild right and moderate left foraminal narrowing at both C5-6 and C6-7. Canal narrowing at these levels is at most mild. Upper chest: Biapical pleuroparenchymal scarring Other: Carotid artery vascular calcification in the. IMPRESSION: 1. 6 mm  hyperdensity in the left frontal lobe concerning for parenchymal contusion with additional subarachnoid hyperattenuation along the anterior frontal poles and in the base of the right precentral sulcus. 2. Right malar soft tissue swelling and a fracture pattern involving the anteromedial walls right maxillary sinus, right lateral orbital wall, and possible diastasis of the right zygomaticomaxillary suture suspicious for a ZMC type fracture pattern. Consider further evaluation with dedicated facial bone CT. 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 4. No acute cervical spine fracture or traumatic listhesis. 5. Multilevel cervical spondylitic changes maximal from C5 through C7, as detailed above. Critical Value/emergent results were called by telephone at the time of interpretation on 12/30/2018 at 9:10 pm to [place] , who verbally acknowledged these results. Electronically Signed   By: Lovena Le M.D.   On: 12/30/2018 21:10   Ct Maxillofacial Wo Contrast  Result Date: 12/31/2018 CLINICAL DATA:  73 year old male with facial trauma. EXAM: CT MAXILLOFACIAL WITHOUT CONTRAST TECHNIQUE: Multidetector CT imaging of the maxillofacial structures was performed. Multiplanar CT image reconstructions were also generated. COMPARISON:  Head CT dated 12/30/2018 FINDINGS: Osseous: Minimally displaced fracture of the anterior wall of the right maxillary sinus and minimally angulated fracture of the posterolateral wall of the right maxillary sinus. There is a minimally depressed fracture of the right orbital floor. Minimally displaced fracture or sutural dehiscence of the lateral wall of the right orbit. There is irregularity of the tip of the nasal bone concerning for a minimally displaced fracture. No mandibular dislocation. Orbits: The globes and retro-orbital fat are preserved.  Sinuses: Probable blood product in the right maxillary sinus. Mild mucoperiosteal thickening of ethmoid air cells and right sphenoid sinus. The mastoid air cells are clear. Soft tissues: Right forehead and periorbital contusion. Limited intracranial: No significant or unexpected finding. IMPRESSION: Fractures of the right maxillary sinus and orbital wall as described as well as fracture of the tip of the nasal bone. Electronically Signed   By: Anner Crete M.D.   On: 12/31/2018 00:41    ROS:ROS  Blood pressure 130/88, pulse 63, temperature 98.8 F (37.1 C), temperature source Oral, resp. rate 20, height 6\' 1"  (1.854 m), weight 80.7 kg, SpO2 93 %.  PHYSICAL EXAM: General appearance - alert, well appearing, and in no distress Mental status - alert, oriented to person, place, and time Eyes - pupils equal and reactive, extraocular eye movements intact, no evidence of diplopia or entrapment Nose - normal and patent, no erythema, discharge or polyps and bloody crusting in the anterior nasal cavity, no septal hematoma or active bleeding Mouth - mucous membranes moist, pharynx normal without lesions and Edentulous, no evidence of  trismus or malocclusion Neck - supple, no significant adenopathy Face-moderate ecchymosis and swelling in the right periorbital and malar region, minimal crusting, no laceration.  Studies Reviewed: Maxillofacial CT scan as above shows nondisplaced right ZMC fracture.  This involves a nondisplaced orbital floor fracture, maxillary sinus fracture and nondisplaced zygomatic arch fracture.  Clinically the patient has no visual change, no diplopia.  He has normal jaw mobility without trismus.  Assessment/Plan: The patient presents to [facility] for evaluation after syncopal episode with facial and intracranial injury.  Physical exam and maxillofacial CT scan are reviewed as above.  No significant concerns for displaced fracture.  The patient will not require any surgical  intervention.  Recommend fracture precautions as outlined below.  Patient will begin frequent nasal saline spray, avoid nose blowing and any further trauma to the region.  Expect full recovery without additional intervention.  I would not recommend antibiotic therapy or other treatment at this time.  Plan follow-up in 2 weeks at [facility] for recheck or sooner as needed.  The patient lives in [place] and has seen an ENT locally who may also be able to facilitate follow-up.  Fracture precautions: 1. Elevate head of bed 2. Ice compress to periorbital region 3. Avoid additional trauma, nose blowing or sneezing 4. Liquid and soft diet as tolerated 5. Saline nasal spray 4 times a day and when necessary   Jerrell Belfast 12/31/2018, 12:24 PM

## 2018-12-31 NOTE — ED Notes (Signed)
F/U on Breakfast Tray Order.

## 2018-12-31 NOTE — ED Notes (Signed)
Admit doctor at bedside

## 2018-12-31 NOTE — Progress Notes (Signed)
Patient seen and examined, admitted by Dr. Hal Hope this morning Briefly 73 year old male with history of chronic systolic CHF, COPD, hypertension, on Xarelto, NSTEMI in 10/2018 at [facility] during which cardiac cath did not show any significant CAD.  Patient presented with a syncopal episode while walking to the parking lot.  Patient reported no prodromal symptoms, no chest pain, palpitation, nausea vomiting or diarrhea.  He said he lost consciousness and hit his face.  EMS was called by the bystanders, no seizure activity was noted. In ED, CT C-spine, head and maxillofacial showed right maxillary orbital and nasal bone fracture.  CT head showed left frontal contusion with subarachnoid hemorrhage.  UDS positive for marijuana Labs showed creatinine 1.5 increased from baseline, hemoglobin 13. Patient was admitted for syncopal episode, SAH. COVID-19 test negative  Labs reviewed On exam, periorbital ecchymosis on the right and on the facial area otherwise unremarkable A/p Agree with assessment and plan per Dr. Hal Hope  Syncope: Cause not clear, possibly vasovagal, AKI - EKG showed rate 77, normal sinus rhythm, prolonged QTC 513 (QTC 498 on EKG on 8/6) -Potassium 4.1, mag 2.0 - Repeat 2D echocardiogram, previous 2D echo with EF of 45%, recent cardiac cath with no significant CAD. -Serial troponin x3, orthostatic vitals.  PT evaluation  Acute kidney injury -Presented with creatinine function of 1.58, baseline 0.9 -Hold off on spironolactone, ARB -Continue gentle hydration, creatinine close to baseline.  Right-sided frontal contusion, subarachnoid hemorrhage -CT head showed 6 mm hypodensity in the left frontal lobe concerning for parenchymal contusion with additional subarachnoid hyperattenuation along the anterior frontal poles and and base of right precentral sulcus -Neurosurgery consulted, per Dr. Tommie Sams, do not believe this is a contusion.  Recommended observation, no need for further CT head  unless neurological changes -Xarelto placed on hold  Right maxillary, orbital and nasal bones fracture -Per trauma service, Dr. Kieth Brightly  History of DVT on Xarelto -Hold Xarelto due to right SAH, resume when cleared by neurosurgery   Ripudeep Rai M.D. [facility] 12/31/2018, 11:13 AM  Pager: [phone]

## 2018-12-31 NOTE — ED Notes (Signed)
PT at bedside.

## 2018-12-31 NOTE — ED Notes (Signed)
Spoke with Echocardiogram will be here shortly to complete test.

## 2018-12-31 NOTE — Progress Notes (Signed)
Subjective: CC: Patient complains of some right shoulder pain. No other complaints.   Objective: Vital signs in last 24 hours: Temp:  [98.4 F (36.9 C)-98.8 F (37.1 C)] 98.8 F (37.1 C) (10/27 0753) Pulse Rate:  [63-140] 63 (10/27 0939) Resp:  [15-24] 19 (10/27 0939) BP: (128-157)/(75-95) 133/95 (10/27 0939) SpO2:  [92 %-99 %] 95 % (10/27 0939) Weight:  [80.7 kg] 80.7 kg (10/26 1637)    Intake/Output from previous day: 10/26 0701 - 10/27 0700 In: 1000 [IV Piggyback:1000] Out: -  Intake/Output this shift: No intake/output data recorded.  PE: Gen:  Alert, NAD, pleasant HEENT: EOM's intact, pupils equal and round. Normal EOM without entrapment. No septal hematoma Card:  RRR Pulm:  CTAB, no W/R/R, effort normal Abd: Soft, NT/ND, +BS Ext: Right shoulder with some bruising over the anterior deltoid. No ttp over clavicle. No skin tenting or deformity. Normal active rom for flexion, abduction and adduction without pain. Remaining extremities unremarkable.  Psych: A&Ox3  Skin: no rashes noted, warm and dry  Lab Results:  Recent Labs    12/30/18 1640 12/31/18 0551  WBC 9.9 7.2  HGB 13.0 13.5  HCT 38.8* 40.8  PLT 197 203   BMET Recent Labs    12/30/18 1640 12/31/18 0551  NA 137 140  K 4.0 4.1  CL 105 110  CO2 23 23  GLUCOSE 172* 108*  BUN 12 10  CREATININE 1.58* 1.25*  CALCIUM 9.1 9.4   PT/INR Recent Labs    12/30/18 1713  LABPROT 22.3*  INR 2.0*   CMP     Component Value Date/Time   NA 140 12/31/2018 0551   K 4.1 12/31/2018 0551   CL 110 12/31/2018 0551   CO2 23 12/31/2018 0551   GLUCOSE 108 (H) 12/31/2018 0551   BUN 10 12/31/2018 0551   CREATININE 1.25 (H) 12/31/2018 0551   CALCIUM 9.4 12/31/2018 0551   PROT 6.4 (L) 12/31/2018 0551   ALBUMIN 3.6 12/31/2018 0551   AST 30 12/31/2018 0551   ALT 28 12/31/2018 0551   ALKPHOS 70 12/31/2018 0551   BILITOT 0.8 12/31/2018 0551   GFRNONAA 58 (L) 12/31/2018 0551   GFRAA >60 12/31/2018 0551    Lipase  No results found for: LIPASE     Studies/Results: Ct Head Wo Contrast  Result Date: 12/30/2018 CLINICAL DATA:  Syncopal episode with head strike EXAM: CT HEAD WITHOUT CONTRAST CT CERVICAL SPINE WITHOUT CONTRAST TECHNIQUE: Multidetector CT imaging of the head and cervical spine was performed following the standard protocol without intravenous contrast. Multiplanar CT image reconstructions of the cervical spine were also generated. COMPARISON:  Brain MRI 05/21/2018, CT head 10/12/2009 FINDINGS: CT HEAD FINDINGS Brain: 6 mm focus of hyperattenuation in the left frontal lobe may reflect a small parenchymal contusion. Additional hyperattenuation is seen in the sulci of the anterior frontal poles and in the base of the right precentral sulcus suggesting some subarachnoid blood. No intraventricular hemorrhage. No mass effect or midline shift. No CT evidence of acute infarct. 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 Patchy areas of white matter hypoattenuation are most compatible with chronic microvascular angiopathy. Symmetric prominence of the ventricles, cisterns and sulci compatible with parenchymal volume loss. Vascular: Atherosclerotic calcification of the carotid siphons. No hyperdense vessel. Skull: Right malar soft tissue swelling. Minimally displaced fractures involving the anterior and lateral walls of the right maxillary sinus. And lateral wall of the right orbit. Question a small cortical step-off for mild diastasis of the right zygomaticomaxillary  suture. No calvarial fracture or scalp hematoma. Fragmentation of the nasal bones appears chronic. Sinuses/Orbits: Small volume right maxillary and sphenoid hemosinus. Minimal thickening adjacent the lateral wall right orbital fracture. Globes are intact. Extraocular muscles are normal and symmetric. Other: None CT CERVICAL SPINE FINDINGS Alignment: Cervical stabilization collar is in place. Mild exaggeration of the normal cervical lordosis. No  traumatic listhesis. Craniocervical and atlantoaxial alignment is maintained. Skull base and vertebrae: Partial collimation of the anteroinferior corner of C7. No acute fracture. No primary bone lesion or focal pathologic process. Degenerative changes at the atlantoaxial joint including a small corticated os odontoideum. Soft tissues and spinal canal: No pre or paravertebral fluid or swelling. No visible canal hematoma. Disc levels: Multilevel intervertebral disc height loss with spondylitic endplate changes. Disc osteophyte complexes, uncinate spurring and facet degenerative changes result in mild right and moderate left foraminal narrowing at both C5-6 and C6-7. Canal narrowing at these levels is at most mild. Upper chest: Biapical pleuroparenchymal scarring Other: Carotid artery vascular calcification in the. IMPRESSION: 1. 6 mm hyperdensity in the left frontal lobe concerning for parenchymal contusion with additional subarachnoid hyperattenuation along the anterior frontal poles and in the base of the right precentral sulcus. 2. Right malar soft tissue swelling and a fracture pattern involving the anteromedial walls right maxillary sinus, right lateral orbital wall, and possible diastasis of the right zygomaticomaxillary suture suspicious for a ZMC type fracture pattern. Consider further evaluation with dedicated facial bone CT. 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 4. No acute cervical spine fracture or traumatic listhesis. 5. Multilevel cervical spondylitic changes maximal from C5 through C7, as detailed above. Critical Value/emergent results were called by telephone at the time of interpretation on 12/30/2018 at 9:10 pm to [place] , who verbally acknowledged these results. Electronically Signed   By: Lovena Le M.D.   On: 12/30/2018 21:10   Ct Cervical Spine  Wo Contrast  Result Date: 12/30/2018 CLINICAL DATA:  Syncopal episode with head strike EXAM: CT HEAD WITHOUT CONTRAST CT CERVICAL SPINE WITHOUT CONTRAST TECHNIQUE: Multidetector CT imaging of the head and cervical spine was performed following the standard protocol without intravenous contrast. Multiplanar CT image reconstructions of the cervical spine were also generated. COMPARISON:  Brain MRI 05/21/2018, CT head 10/12/2009 FINDINGS: CT HEAD FINDINGS Brain: 6 mm focus of hyperattenuation in the left frontal lobe may reflect a small parenchymal contusion. Additional hyperattenuation is seen in the sulci of the anterior frontal poles and in the base of the right precentral sulcus suggesting some subarachnoid blood. No intraventricular hemorrhage. No mass effect or midline shift. No CT evidence of acute infarct. 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 Patchy areas of white matter hypoattenuation are most compatible with chronic microvascular angiopathy. Symmetric prominence of the ventricles, cisterns and sulci compatible with parenchymal volume loss. Vascular: Atherosclerotic calcification of the carotid siphons. No hyperdense vessel. Skull: Right malar soft tissue swelling. Minimally displaced fractures involving the anterior and lateral walls of the right maxillary sinus. And lateral wall of the right orbit. Question a small cortical step-off for mild diastasis of the right zygomaticomaxillary suture. No calvarial fracture or scalp hematoma. Fragmentation of the nasal bones appears chronic. Sinuses/Orbits: Small volume right maxillary and sphenoid hemosinus. Minimal thickening adjacent the lateral wall right orbital fracture. Globes are intact. Extraocular muscles are normal and symmetric. Other: None CT CERVICAL SPINE FINDINGS Alignment: Cervical stabilization collar is in place.  Mild exaggeration of the normal cervical lordosis. No traumatic listhesis. Craniocervical and atlantoaxial alignment is maintained.  Skull base and vertebrae: Partial collimation of the anteroinferior corner of C7. No acute fracture. No primary bone lesion or focal pathologic process. Degenerative changes at the atlantoaxial joint including a small corticated os odontoideum. Soft tissues and spinal canal: No pre or paravertebral fluid or swelling. No visible canal hematoma. Disc levels: Multilevel intervertebral disc height loss with spondylitic endplate changes. Disc osteophyte complexes, uncinate spurring and facet degenerative changes result in mild right and moderate left foraminal narrowing at both C5-6 and C6-7. Canal narrowing at these levels is at most mild. Upper chest: Biapical pleuroparenchymal scarring Other: Carotid artery vascular calcification in the. IMPRESSION: 1. 6 mm hyperdensity in the left frontal lobe concerning for parenchymal contusion with additional subarachnoid hyperattenuation along the anterior frontal poles and in the base of the right precentral sulcus. 2. Right malar soft tissue swelling and a fracture pattern involving the anteromedial walls right maxillary sinus, right lateral orbital wall, and possible diastasis of the right zygomaticomaxillary suture suspicious for a ZMC type fracture pattern. Consider further evaluation with dedicated facial bone CT. 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 4. No acute cervical spine fracture or traumatic listhesis. 5. Multilevel cervical spondylitic changes maximal from C5 through C7, as detailed above. Critical Value/emergent results were called by telephone at the time of interpretation on 12/30/2018 at 9:10 pm to [place] , who verbally acknowledged these results. Electronically Signed   By: Lovena Le M.D.   On: 12/30/2018 21:10   Ct Maxillofacial Wo Contrast  Result Date: 12/31/2018 CLINICAL DATA:  72 year old male with  facial trauma. EXAM: CT MAXILLOFACIAL WITHOUT CONTRAST TECHNIQUE: Multidetector CT imaging of the maxillofacial structures was performed. Multiplanar CT image reconstructions were also generated. COMPARISON:  Head CT dated 12/30/2018 FINDINGS: Osseous: Minimally displaced fracture of the anterior wall of the right maxillary sinus and minimally angulated fracture of the posterolateral wall of the right maxillary sinus. There is a minimally depressed fracture of the right orbital floor. Minimally displaced fracture or sutural dehiscence of the lateral wall of the right orbit. There is irregularity of the tip of the nasal bone concerning for a minimally displaced fracture. No mandibular dislocation. Orbits: The globes and retro-orbital fat are preserved. Sinuses: Probable blood product in the right maxillary sinus. Mild mucoperiosteal thickening of ethmoid air cells and right sphenoid sinus. The mastoid air cells are clear. Soft tissues: Right forehead and periorbital contusion. Limited intracranial: No significant or unexpected finding. IMPRESSION: Fractures of the right maxillary sinus and orbital wall as described as well as fracture of the tip of the nasal bone. Electronically Signed   By: Anner Crete M.D.   On: 12/31/2018 00:41    Anti-infectives: Anti-infectives (From admission, onward)   None       Assessment/Plan Syncope Left frontal parenchymal contusion - NS consulted. Recommended "no need for repeat scan unless neurological exam changes. No specific recommendations other than serial exams over next 12 hrs" Right maxillary sinus fx, right orbital wall fx, tip of nasal bone fx - No septal hematoma. Normal EOM without entrapment. Recommend ENT consultation. Chronic Anticoagulation use  CAD HTN HLD COPD CHF AKI  NS recommending no f/u head CT. Recommend ENT consultation for facial fx's. No further recs. Trauma will sign off. Please call back with any questions or concerns.    LOS:  0  days    Jillyn Ledger , [facility] 12/31/2018, 11:15 AM Pager: [phone]

## 2018-12-31 NOTE — ED Notes (Signed)
Lunch tray at bedside and pt eating at this time.

## 2018-12-31 NOTE — ED Provider Notes (Signed)
1:29 AM  Patient signed out pending neurosurgery consultation and call back.  I have requested that the secretary repaged neurosurgery to my phone.  Original consultation order 39.  Plan for medicine admission for syncope work-up.  Patient does have traumatic injuries.  Trauma surgery to consult.  Hospitalist requesting neurosurgery recommendations prior to admission.  1:57 AM  Spoke with Dr. Christella Noa, neurosurgery.  Advised him of the patient's CT scan and plan for admission to the hospitalist for his syncopal work-up.  Requesting recommendations regarding his parenchymal contusion and possible subarachnoid hemorrhage.  Dr. Christella Noa did not provide any recommendations over the phone but will evaluate patient.  This was discussed with Dr. Hal Hope.   Merryl Hacker, MD 12/31/18 0157

## 2018-12-31 NOTE — ED Notes (Signed)
Cardiology PA at bedside. 

## 2018-12-31 NOTE — ED Notes (Signed)
Echocardiogram at bedside.

## 2018-12-31 NOTE — Progress Notes (Signed)
Patient ID: Bruce [name], male   DOB: 1945/06/23, 73 y.o.   MRN: [id number] Films reviewed.do not believe this is a contusion. No need for repeat scan unless neurological exam changes. No specific recommendations other than serial exams over next 12 hrs. Call if questions.

## 2018-12-31 NOTE — ED Notes (Signed)
Cardiologist at bedside.  

## 2018-12-31 NOTE — Consult Note (Signed)
Activation and Reason: consult, fall from standing height  Primary Survey: airway intact, breath sounds present, pulses intact  Bruce [name] is an 73 y.o. male.  HPI: 73 yo male was helping a friend lift heavy items into a truck. He remembers turning to get into his car and passed out and fell. He complains of right shoulder pain. He takes xarelto for VTE. He had an NSTEMI 2 months ago but no stent was placed.  Past Medical History:  Diagnosis Date  . Anginal pain ([place])    Left side if chest ,NTG  relieves chaes apin 12/01/13  . Anxiety   . Arthritis   . Cancer [facility])    colon cancer  . CHF (congestive heart failure) ([place])   . 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 Complication of anesthesia    wake up with a head ache  . COPD (chronic obstructive pulmonary disease) ([place])   . Coronary artery disease   . GERD (gastroesophageal reflux disease)   . Headache    related to sinus congestion  . History of blood transfusion   . History of kidney stones   . Hypertension   . Myocardial infarction [facility])    '09 AND '12  . Peripheral vascular disease ([place])   . Shortness of breath    With exertion .  Marland Kitchen UTI (urinary tract infection)    frequent UTI    Past Surgical History:  Procedure Laterality Date  . CARDIAC CATHETERIZATION    . CHOLECYSTECTOMY    . EXTERNAL FIXATION LEG Right 10/06/2013   Procedure: CLOSED REDUCTION RIGHT TIBIAL PLATEAU FRACTURE, EXTERNAL FIXATION RIGHT LEG, PLACEMENT OF WOUND VAC;  Surgeon: Rozanna Box, MD;  Location: [place];  Service: Orthopedics;  Laterality: Right;  . FEMORAL-POPLITEAL BYPASS GRAFT Right 10/06/2013   Procedure: RIGHT POPLITEAL-POPLITEAL ARTERY BYPASS GRAFT;  Surgeon: Rosetta Posner, MD;  Location: [place];  Service: Vascular;  Laterality: Right;  . FRACTURE SURGERY Right 2014  . HARDWARE REMOVAL Right 12/02/2013   Procedure: REMOVAL EXTERNAL FIXATION RIGHT LEG ;  Surgeon: Rozanna Box, MD;   Location: [place];  Service: Orthopedics;  Laterality: Right;  . HERNIA REPAIR Right 1990's  . I&D EXTREMITY Right 10/09/2013   Procedure: IRRIGATION AND DEBRIDEMENT RIGHT LEG, CLOSURE  OF WOUNDS, PLACEMENT OF WOUND VAC ON EACH SIDE OF LEG;  Surgeon: Rozanna Box, MD;  Location: [place];  Service: Orthopedics;  Laterality: Right;  . IR NEPHROSTOMY PLACEMENT LEFT  06/19/2016  . IVC filter  2009   placed @ [facility]/ Removed in 2010.  . IVC Filter Removed    . LEFT HEART CATH AND CORONARY ANGIOGRAPHY Right 10/11/2018   Procedure: LEFT HEART CATH AND CORONARY ANGIOGRAPHY;  Surgeon: [name], MD;  Location: [place] [facility];  Service: Cardiovascular;  Laterality: Right;  . ligament leg Left   . NEPHROLITHOTOMY Left 06/19/2016   Procedure: NEPHROLITHOTOMY PERCUTANEOUS;  Surgeon: Hollice Espy, MD;  Location: [facility];  Service: Urology;  Laterality: Left;  . ORIF TIBIA FRACTURE Right 12/29/2014   Procedure: OPEN REDUCTION INTERNAL FIXATION (ORIF) RIGHT TIBIA FRACTURE, RIA VS ICBG;  Surgeon: Altamese [place], MD;  Location: [place];  Service: Orthopedics;  Laterality: Right;  . RADIOACTIVE SEED IMPLANT N/A 09/12/2016   Procedure: RADIOACTIVE SEED IMPLANT/BRACHYTHERAPY IMPLANT;  Surgeon: Hollice Espy, MD;  Location: [facility];  Service: Urology;  Laterality: N/A;    Family History  Problem Relation Age of Onset  .  Hypertension Mother   . Prostate cancer Brother   . Mental illness Neg Hx     Social History:  reports that he has been smoking. He has a 26.00 pack-year smoking history. He has never used smokeless tobacco. He reports that he does not drink alcohol or use drugs.  Allergies:  Allergies  Allergen Reactions  . Adhesive [Tape] Other (See Comments)    After right leg fracture surgery, pt developed a large blister where tape was applied to his right leg. OK to use paper tape.  . Imdur [Isosorbide Dinitrate] Other (See Comments)    hallucinations  . Singulair [Montelukast Sodium] Other  (See Comments)    Hallucinations     Medications: I have reviewed the patient's current medications.  Results for orders placed or performed during the hospital encounter of 12/30/18 (from the past 48 hour(s))  Basic metabolic panel     Status: Abnormal   Collection Time: 12/30/18  4:40 PM  Result Value Ref Range   Sodium 137 135 - 145 mmol/L   Potassium 4.0 3.5 - 5.1 mmol/L   Chloride 105 98 - 111 mmol/L   CO2 23 22 - 32 mmol/L   Glucose, Bld 172 (H) 70 - 99 mg/dL   BUN 12 8 - 23 mg/dL   Creatinine, Ser 1.58 (H) 0.61 - 1.24 mg/dL   Calcium 9.1 8.9 - 10.3 mg/dL   GFR calc non Af Amer 43 (L) >60 mL/min   GFR calc Af Amer 50 (L) >60 mL/min   Anion gap 9 5 - 15    Comment: Performed at [facility], [place] [address], [place], [place] [address]  CBC     Status: Abnormal   Collection Time: 12/30/18  4:40 PM  Result Value Ref Range   WBC 9.9 4.0 - 10.5 K/uL   RBC 3.93 (L) 4.22 - 5.81 MIL/uL   Hemoglobin 13.0 13.0 - 17.0 g/dL   HCT 38.8 (L) 39.0 - 52.0 %   MCV 98.7 80.0 - 100.0 fL   MCH 33.1 26.0 - 34.0 pg   MCHC 33.5 30.0 - 36.0 g/dL   RDW 12.7 11.5 - 15.5 %   Platelets 197 150 - 400 K/uL   nRBC 0.0 0.0 - 0.2 %    Comment: Performed at [facility], [place] [address] [place], [place] [address]  Protime-INR     Status: Abnormal   Collection Time: 12/30/18  5:13 PM  Result Value Ref Range   Prothrombin Time 22.3 (H) 11.4 - 15.2 seconds   INR 2.0 (H) 0.8 - 1.2    Comment: (NOTE) INR goal varies based on device and disease states. Performed at [facility], [place] [address], [place], [place] [address]   Urinalysis, Routine w reflex microscopic     Status: Abnormal   Collection Time: 12/30/18  8:15 PM  Result Value Ref Range   Color, Urine YELLOW YELLOW   APPearance CLEAR CLEAR   Specific Gravity, Urine 1.020 1.005 - 1.030   pH 5.5 5.0 - 8.0   Glucose, UA NEGATIVE NEGATIVE mg/dL   Hgb urine dipstick TRACE (A) NEGATIVE   Bilirubin Urine NEGATIVE NEGATIVE    Ketones, ur NEGATIVE NEGATIVE mg/dL   Protein, ur NEGATIVE NEGATIVE mg/dL   Nitrite NEGATIVE NEGATIVE   Leukocytes,Ua NEGATIVE NEGATIVE    Comment: Performed at [place] [address], [place], [place] [address]  Urinalysis, Microscopic (reflex)     Status: Abnormal   Collection Time:  12/30/18  8:15 PM  Result Value Ref Range   RBC / HPF 0-5 0 - 5 RBC/hpf   WBC, UA 0-5 0 - 5 WBC/hpf   Bacteria, UA RARE (A) NONE SEEN   Squamous Epithelial / LPF 0-5 0 - 5    Comment: Performed at [facility], [place] [address] [place], [place] [address]  Rapid urine drug screen (hospital performed)     Status: Abnormal   Collection Time: 12/30/18  8:15 PM  Result Value Ref Range   Opiates NONE DETECTED NONE DETECTED   Cocaine NONE DETECTED NONE DETECTED   Benzodiazepines NONE DETECTED NONE DETECTED   Amphetamines NONE DETECTED NONE DETECTED   Tetrahydrocannabinol POSITIVE (A) NONE DETECTED   Barbiturates NONE DETECTED NONE DETECTED    Comment: (NOTE) DRUG SCREEN FOR MEDICAL PURPOSES ONLY.  IF CONFIRMATION IS NEEDED FOR ANY PURPOSE, NOTIFY LAB WITHIN 5 DAYS. LOWEST DETECTABLE LIMITS FOR URINE DRUG SCREEN Drug Class                     Cutoff (ng/mL) Amphetamine and metabolites    1000 Barbiturate and metabolites    200 Benzodiazepine                 [id number] Tricyclics and metabolites     300 Opiates and metabolites        300 Cocaine and metabolites        300 THC                            50 Performed at [facility], [place] [address], [place], [place] [address]     Ct Head Wo Contrast  Result Date: 12/30/2018 CLINICAL DATA:  Syncopal episode with head strike EXAM: CT HEAD WITHOUT CONTRAST CT CERVICAL SPINE WITHOUT CONTRAST TECHNIQUE: Multidetector CT imaging of the head and cervical spine was performed following the standard protocol without intravenous contrast. Multiplanar CT image reconstructions of the cervical spine were also generated. COMPARISON:  Brain MRI  05/21/2018, CT head 10/12/2009 FINDINGS: CT HEAD FINDINGS Brain: 6 mm focus of hyperattenuation in the left frontal lobe may reflect a small parenchymal contusion. Additional hyperattenuation is seen in the sulci of the anterior frontal poles and in the base of the right precentral sulcus suggesting some subarachnoid blood. No intraventricular hemorrhage. No mass effect or midline shift. No CT evidence of acute infarct. 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 Patchy areas of white matter hypoattenuation are most compatible with chronic microvascular angiopathy. Symmetric prominence of the ventricles, cisterns and sulci compatible with parenchymal volume loss. Vascular: Atherosclerotic calcification of the carotid siphons. No hyperdense vessel. Skull: Right malar soft tissue swelling. Minimally displaced fractures involving the anterior and lateral walls of the right maxillary sinus. And lateral wall of the right orbit. Question a small cortical step-off for mild diastasis of the right zygomaticomaxillary suture. No calvarial fracture or scalp hematoma. Fragmentation of the nasal bones appears chronic. Sinuses/Orbits: Small volume right maxillary and sphenoid hemosinus. Minimal thickening adjacent the lateral wall right orbital fracture. Globes are intact. Extraocular muscles are normal and symmetric. Other: None CT CERVICAL SPINE FINDINGS Alignment: Cervical stabilization collar is in place. Mild exaggeration of the normal cervical lordosis. No traumatic listhesis. Craniocervical and atlantoaxial alignment is maintained. Skull base and vertebrae: Partial collimation of the anteroinferior corner of C7. No acute fracture. No primary bone lesion or focal pathologic process. Degenerative changes at the atlantoaxial joint including  a small corticated os odontoideum. Soft tissues and spinal canal: No pre or paravertebral fluid or swelling. No visible canal hematoma. Disc levels: Multilevel intervertebral disc height loss  with spondylitic endplate changes. Disc osteophyte complexes, uncinate spurring and facet degenerative changes result in mild right and moderate left foraminal narrowing at both C5-6 and C6-7. Canal narrowing at these levels is at most mild. Upper chest: Biapical pleuroparenchymal scarring Other: Carotid artery vascular calcification in the. IMPRESSION: 1. 6 mm hyperdensity in the left frontal lobe concerning for parenchymal contusion with additional subarachnoid hyperattenuation along the anterior frontal poles and in the base of the right precentral sulcus. 2. Right malar soft tissue swelling and a fracture pattern involving the anteromedial walls right maxillary sinus, right lateral orbital wall, and possible diastasis of the right zygomaticomaxillary suture suspicious for a ZMC type fracture pattern. Consider further evaluation with dedicated facial bone CT. 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 4. No acute cervical spine fracture or traumatic listhesis. 5. Multilevel cervical spondylitic changes maximal from C5 through C7, as detailed above. Critical Value/emergent results were called by telephone at the time of interpretation on 12/30/2018 at 9:10 pm to [place] , who verbally acknowledged these results. Electronically Signed   By: Lovena Le M.D.   On: 12/30/2018 21:10   Ct Cervical Spine Wo Contrast  Result Date: 12/30/2018 CLINICAL DATA:  Syncopal episode with head strike EXAM: CT HEAD WITHOUT CONTRAST CT CERVICAL SPINE WITHOUT CONTRAST TECHNIQUE: Multidetector CT imaging of the head and cervical spine was performed following the standard protocol without intravenous contrast. Multiplanar CT image reconstructions of the cervical spine were also generated. COMPARISON:  Brain MRI 05/21/2018, CT head 10/12/2009 FINDINGS: CT HEAD FINDINGS Brain: 6 mm focus  of hyperattenuation in the left frontal lobe may reflect a small parenchymal contusion. Additional hyperattenuation is seen in the sulci of the anterior frontal poles and in the base of the right precentral sulcus suggesting some subarachnoid blood. No intraventricular hemorrhage. No mass effect or midline shift. No CT evidence of acute infarct. 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 Patchy areas of white matter hypoattenuation are most compatible with chronic microvascular angiopathy. Symmetric prominence of the ventricles, cisterns and sulci compatible with parenchymal volume loss. Vascular: Atherosclerotic calcification of the carotid siphons. No hyperdense vessel. Skull: Right malar soft tissue swelling. Minimally displaced fractures involving the anterior and lateral walls of the right maxillary sinus. And lateral wall of the right orbit. Question a small cortical step-off for mild diastasis of the right zygomaticomaxillary suture. No calvarial fracture or scalp hematoma. Fragmentation of the nasal bones appears chronic. Sinuses/Orbits: Small volume right maxillary and sphenoid hemosinus. Minimal thickening adjacent the lateral wall right orbital fracture. Globes are intact. Extraocular muscles are normal and symmetric. Other: None CT CERVICAL SPINE FINDINGS Alignment: Cervical stabilization collar is in place. Mild exaggeration of the normal cervical lordosis. No traumatic listhesis. Craniocervical and atlantoaxial alignment is maintained. Skull base and vertebrae: Partial collimation of the anteroinferior corner of C7. No acute fracture. No primary bone lesion or focal pathologic process. Degenerative changes at the atlantoaxial joint including a small corticated os odontoideum. Soft tissues and spinal canal: No pre or paravertebral fluid or swelling. No visible canal hematoma. Disc levels: Multilevel intervertebral disc height loss with spondylitic endplate changes. Disc osteophyte complexes, uncinate  spurring and facet degenerative changes result in mild right and moderate left foraminal narrowing at both  C5-6 and C6-7. Canal narrowing at these levels is at most mild. Upper chest: Biapical pleuroparenchymal scarring Other: Carotid artery vascular calcification in the. IMPRESSION: 1. 6 mm hyperdensity in the left frontal lobe concerning for parenchymal contusion with additional subarachnoid hyperattenuation along the anterior frontal poles and in the base of the right precentral sulcus. 2. Right malar soft tissue swelling and a fracture pattern involving the anteromedial walls right maxillary sinus, right lateral orbital wall, and possible diastasis of the right zygomaticomaxillary suture suspicious for a ZMC type fracture pattern. Consider further evaluation with dedicated facial bone CT. 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 4. No acute cervical spine fracture or traumatic listhesis. 5. Multilevel cervical spondylitic changes maximal from C5 through C7, as detailed above. Critical Value/emergent results were called by telephone at the time of interpretation on 12/30/2018 at 9:10 pm to [place] , who verbally acknowledged these results. Electronically Signed   By: Lovena Le M.D.   On: 12/30/2018 21:10   Ct Maxillofacial Wo Contrast  Result Date: 12/31/2018 CLINICAL DATA:  73 year old male with facial trauma. EXAM: CT MAXILLOFACIAL WITHOUT CONTRAST TECHNIQUE: Multidetector CT imaging of the maxillofacial structures was performed. Multiplanar CT image reconstructions were also generated. COMPARISON:  Head CT dated 12/30/2018 FINDINGS: Osseous: Minimally displaced fracture of the anterior wall of the right maxillary sinus and minimally angulated fracture of the posterolateral wall of the right maxillary sinus. There is a minimally depressed fracture of the  right orbital floor. Minimally displaced fracture or sutural dehiscence of the lateral wall of the right orbit. There is irregularity of the tip of the nasal bone concerning for a minimally displaced fracture. No mandibular dislocation. Orbits: The globes and retro-orbital fat are preserved. Sinuses: Probable blood product in the right maxillary sinus. Mild mucoperiosteal thickening of ethmoid air cells and right sphenoid sinus. The mastoid air cells are clear. Soft tissues: Right forehead and periorbital contusion. Limited intracranial: No significant or unexpected finding. IMPRESSION: Fractures of the right maxillary sinus and orbital wall as described as well as fracture of the tip of the nasal bone. Electronically Signed   By: Anner Crete M.D.   On: 12/31/2018 00:41    Review of Systems  Constitutional: Negative for chills and fever.  HENT: Negative for hearing loss.   Eyes: Negative for blurred vision and double vision.  Respiratory: Negative for cough and hemoptysis.   Cardiovascular: Negative for chest pain and palpitations.  Gastrointestinal: Negative for abdominal pain, nausea and vomiting.  Genitourinary: Negative for dysuria and urgency.  Musculoskeletal: Positive for joint pain. Negative for myalgias and neck pain.  Skin: Negative for itching and rash.  Neurological: Positive for dizziness and headaches. Negative for tingling.  Endo/Heme/Allergies: Bruises/bleeds easily.  Psychiatric/Behavioral: Negative for depression and suicidal ideas.   Blood pressure 139/81, pulse 81, temperature 98.4 F (36.9 C), temperature source Oral, resp. rate 17, height 6\' 1"  (1.854 m), weight 80.7 kg, SpO2 93 %. Physical Exam  Constitutional: He is oriented to person, place, and time. He appears well-developed and well-nourished.  HENT:  Head: Not microcephalic. Head is without raccoon's eyes, without abrasion and without contusion.  Right Ear: No drainage or swelling. No foreign bodies.  Left  Ear: No drainage or swelling. No foreign bodies.  Nose: No mucosal edema, rhinorrhea or nose lacerations.  Mouth/Throat: Oropharynx is clear and moist and mucous membranes are normal.  Eyes: EOM are normal. Right  eye exhibits no discharge. Left eye exhibits no discharge.  Right medial conjunctive hemorrhage  Neck: Neck supple.  Cardiovascular:  Pulses:      Carotid pulses are 2+ on the right side and 2+ on the left side.      Radial pulses are 2+ on the right side and 2+ on the left side.       Dorsalis pedis pulses are 2+ on the right side and 2+ on the left side.  Respiratory: Effort normal. No apnea. No respiratory distress. He has no decreased breath sounds. He has no rhonchi.  GI: He exhibits no shifting dullness and no distension. There is no abdominal tenderness. There is no rigidity, no guarding, no tenderness at McBurney's point and negative Murphy's sign.  Neurological: He is alert and oriented to person, place, and time. He has normal strength. No cranial nerve deficit or sensory deficit. GCS eye subscore is 4. GCS verbal subscore is 5. GCS motor subscore is 6.  Psychiatric: His speech is normal and behavior is normal. Thought content normal. His mood appears anxious.   Assessment/Plan: 73 yo male with syncope  Left frontal parenchymal contusion - GCS 15 -consult NSG -close monitoring  Right possible maxillary fracture -consult ENT  CHF/blood thinners/COPD/HTN/HL -recommend admission to hospitalists for medical management  Arta Bruce Merary Garguilo 12/31/2018, 12:53 AM

## 2018-12-31 NOTE — ED Notes (Signed)
Pt ambulatory to restroom

## 2018-12-31 NOTE — Progress Notes (Signed)
Echocardiogram 2D Echocardiogram has been performed.  Oneal Deputy Macintyre Alexa 12/31/2018, 4:01 PM

## 2018-12-31 NOTE — Progress Notes (Signed)
Patient arrived to 3W24 A&O x4. Ambulatory. Bruising on R side of face and eye area. Complains of pain in knee. POC provided to patient. Call bell within reach. Nurse will continue to monitor.

## 2018-12-31 NOTE — ED Notes (Signed)
Lunch Tray Ordered @ 1113.  

## 2018-12-31 NOTE — Progress Notes (Signed)
Pt is up to walk with assistance, and note his strength is reduced with significant pain after falling on R knee.  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  Follow for these needs, focusing on balance as pt will likely not fill the order for outpatient therapy.  12/31/18 1200  PT Visit Information  Last PT Received On 12/31/18  Assistance Needed +1  History of Present Illness 73 yo male with onset of syncope and fall from no known cause was admitted to ED.  Has a facial fracture of R maxillary, orbital and nasal bones.  Has subarachnoid hemorrhage, R frontal contusion and will have neuro follow.  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   Had marijuana in system.  PMHx:  angina, MI, PVD, COPD, colon CA, anxiety, CHF, 2016 R tibial fracture, UTI, transfusion  Precautions  Precautions Fall  Precaution Comments unsteady with no AD  Restrictions  Weight Bearing Restrictions No  Home Living  Family/patient expects to be discharged to: Private residence  Living Arrangements Alone  Available Help at Discharge Family;Friend(s);Available PRN/intermittently  Type of Home House  Home Access Stairs to enter  Entrance Stairs-Number of Steps 3  Entrance Stairs-Rails Right;Left  Home Layout Multi-level (split level camper with HHA on wall to step up to bedroom)  Alternate Level Stairs-Number of Steps Nord - 2 wheels;Cane - single point  Prior Function  Level of Independence Independent with assistive device(s)  Comments uses SPC for pain management  Communication  Communication No difficulties  Pain Assessment  Pain Assessment 0-10  Pain Score 7  Pain Location R knee after his fall  Pain Descriptors / Indicators Grimacing  Pain Intervention(s) Limited activity within patient's tolerance;Monitored during session;Repositioned;Patient requesting pain meds-RN notified  Cognition  Arousal/Alertness Awake/alert   Behavior During Therapy WFL for tasks assessed/performed  Overall Cognitive Status Within Functional Limits for tasks assessed  Upper Extremity Assessment  Upper Extremity Assessment Overall WFL for tasks assessed  Lower Extremity Assessment  Lower Extremity Assessment RLE deficits/detail  RLE Deficits / Details old tibial fracture with pain from this fall  RLE Coordination decreased gross motor  Cervical / Trunk Assessment  Cervical / Trunk Assessment Normal  Bed Mobility  Overal bed mobility Modified Independent  Transfers  Overall transfer level Modified independent  Equipment used None  General transfer comment stands from bedside with no issues  Ambulation/Gait  Ambulation/Gait assistance Min guard  Gait Distance (Feet) 100 Feet  Assistive device 1 person hand held assist  Gait Pattern/deviations Step-through pattern;Wide base of support;Decreased stance time - right;Decreased stride length  General Gait Details pt is painful with further gait on R knee but did not use walker, could benefit from  Gait velocity reduced  Balance  Overall balance assessment History of Falls  General Comments  General comments (skin integrity, edema, etc.) pt is up to walk with pulses in normal range and sat 95%, after gait pulses unchanged and sat on room air was 90%.  Replaced cannula and is 98% in room  Exercises  Exercises Other exercises (LE strength is 4+ on R knee and otherwise [facility])  PT - End of Session  Equipment Utilized During Treatment Gait belt;Oxygen  Activity Tolerance Patient limited by fatigue;Treatment limited secondary to medical complications (Comment) (balance changes in part from proprioceptive chang R knee)  Patient left in bed;with call bell/phone within reach  Nurse Communication  Mobility status (discharge plans)  PT Assessment  PT Recommendation/Assessment Patient needs continued PT services  PT Visit Diagnosis Unsteadiness on feet (R26.81);Muscle weakness  (generalized) (M62.81);Difficulty in walking, not elsewhere classified (R26.2);Pain  Pain - Right/Left Right  Pain - part of body Knee  PT Problem List Decreased strength;Decreased range of motion;Decreased activity tolerance;Decreased balance;Decreased mobility;Decreased coordination;Cardiopulmonary status limiting activity;Pain  Barriers to Discharge Inaccessible home environment;Decreased caregiver support  Barriers to Discharge Comments home with stairs and no direct assistance  PT Plan  PT Frequency (ACUTE ONLY) Min 3X/week  PT Treatment/Interventions (ACUTE ONLY) DME instruction;Stair training;Gait training;Functional mobility training;Therapeutic activities;Therapeutic exercise;Balance training;Neuromuscular re-education;Patient/family education  AM-PAC PT "6 Clicks" Mobility Outcome Measure (Version 2)  Help needed turning from your back to your side while in a flat bed without using bedrails? 4  Help needed moving from lying on your back to sitting on the side of a flat bed without using bedrails? 4  Help needed moving to and from a bed to a chair (including a wheelchair)? 3  Help needed standing up from a chair using your arms (e.g., wheelchair or bedside chair)? 3  Help needed to walk in hospital room? 3  Help needed climbing 3-5 steps with a railing?  3  6 Click Score 20  Consider Recommendation of Discharge To: Home with no services  PT Recommendation  Follow Up Recommendations Outpatient PT  PT equipment None recommended by PT (has a SPC and RW)  Individuals Consulted  Consulted and Agree with Results and Recommendations Patient  Acute Rehab PT Goals  Patient Stated Goal to walk and recover his comfort on R knee  PT Goal Formulation With patient  Time For Goal Achievement 01/14/19  Potential to Achieve Goals Good  PT Time Calculation  PT Start Time (ACUTE ONLY) 1107  PT Stop Time (ACUTE ONLY) 1132  PT Time Calculation (min) (ACUTE ONLY) 25 min  PT General Charges  $$  ACUTE PT VISIT 1 Visit  PT Evaluation  $PT Eval Moderate Complexity 1 Mod  PT Treatments  $Gait Training 8-22 mins  Written Expression  Dominant Hand Right    Mee Hives, PT MS Acute Rehab Dept. Number: [facility] [place]

## 2018-12-31 NOTE — H&P (Addendum)
History and Physical    Bruce [name] [id number] DOB: 11-03-1945 DOA: 12/30/2018  PCP: Danelle Berry, NP  Patient coming from: Home.  Chief Complaint: Loss of consciousness.  HPI: Bruce [name] is a 73 y.o. male with history of chronic systolic heart failure, COPD, hypertension, admission for non-ST elevation MI in August 2020 at [facility] during which cardiac cath did not show any significant coronary artery obstruction had a syncopal episode last evening while walking to the parking lot.  Patient states he did not have any prodromal symptoms did not have any chest pain palpitation nausea vomiting or diarrhea.  He said he lost consciousness and hit his face.  Bystanders had to call EMS by then he regained his consciousness.  Patient did not have any incontinence of urine or any tongue bite.  ED Course: In the ER monitor was showing sinus rhythm.  CT scan of the C-spine head and maxillofacial was done.  Shows right maxillary orbital and nasal bone fracture.  CT head was showing left frontal contusion with subarachnoid hemorrhage.  On-call trauma surgeon and neurosurgeon Dr. Christella Noa were consulted.  Urine drug screen is positive for marijuana.  Labs show creatinine 1.5 increased from baseline hemoglobin 13 platelets 197 INR 2.  COVID-19 is pending.  Patient admitted for syncope with multiple fractures involving the facial area with contusion of the brain with possible hemorrhage.  Review of Systems: As per HPI, rest all negative.   Past Medical History:  Diagnosis Date  . Anginal pain ([place])    Left side if chest ,NTG  relieves chaes apin 12/01/13  . Anxiety   . Arthritis   . Cancer [facility])    colon cancer  . CHF (congestive heart failure) ([place])   . 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 Complication of anesthesia    wake up with a head ache  . COPD (chronic obstructive pulmonary disease) ([place])   . Coronary  artery disease   . GERD (gastroesophageal reflux disease)   . Headache    related to sinus congestion  . History of blood transfusion   . History of kidney stones   . Hypertension   . Myocardial infarction [facility])    '09 AND '12  . Peripheral vascular disease ([place])   . Shortness of breath    With exertion .  Marland Kitchen UTI (urinary tract infection)    frequent UTI    Past Surgical History:  Procedure Laterality Date  . CARDIAC CATHETERIZATION    . CHOLECYSTECTOMY    . EXTERNAL FIXATION LEG Right 10/06/2013   Procedure: CLOSED REDUCTION RIGHT TIBIAL PLATEAU FRACTURE, EXTERNAL FIXATION RIGHT LEG, PLACEMENT OF WOUND VAC;  Surgeon: Rozanna Box, MD;  Location: [place];  Service: Orthopedics;  Laterality: Right;  . FEMORAL-POPLITEAL BYPASS GRAFT Right 10/06/2013   Procedure: RIGHT POPLITEAL-POPLITEAL ARTERY BYPASS GRAFT;  Surgeon: Rosetta Posner, MD;  Location: [place];  Service: Vascular;  Laterality: Right;  . FRACTURE SURGERY Right 2014  . HARDWARE REMOVAL Right 12/02/2013   Procedure: REMOVAL EXTERNAL FIXATION RIGHT LEG ;  Surgeon: Rozanna Box, MD;  Location: [place];  Service: Orthopedics;  Laterality: Right;  . HERNIA REPAIR Right 1990's  . I&D EXTREMITY Right 10/09/2013   Procedure: IRRIGATION AND DEBRIDEMENT RIGHT LEG, CLOSURE  OF WOUNDS, PLACEMENT OF WOUND VAC ON EACH SIDE OF LEG;  Surgeon: Rozanna Box, MD;  Location: [place];  Service: Orthopedics;  Laterality: Right;  .  IR NEPHROSTOMY PLACEMENT LEFT  06/19/2016  . IVC filter  2009   placed @ [facility]/ Removed in 2010.  . IVC Filter Removed    . LEFT HEART CATH AND CORONARY ANGIOGRAPHY Right 10/11/2018   Procedure: LEFT HEART CATH AND CORONARY ANGIOGRAPHY;  Surgeon: [name], MD;  Location: [place] [facility];  Service: Cardiovascular;  Laterality: Right;  . ligament leg Left   . NEPHROLITHOTOMY Left 06/19/2016   Procedure: NEPHROLITHOTOMY PERCUTANEOUS;  Surgeon: Hollice Espy, MD;  Location: [facility];  Service: Urology;  Laterality: Left;   . ORIF TIBIA FRACTURE Right 12/29/2014   Procedure: OPEN REDUCTION INTERNAL FIXATION (ORIF) RIGHT TIBIA FRACTURE, RIA VS ICBG;  Surgeon: Altamese [place], MD;  Location: [place];  Service: Orthopedics;  Laterality: Right;  . RADIOACTIVE SEED IMPLANT N/A 09/12/2016   Procedure: RADIOACTIVE SEED IMPLANT/BRACHYTHERAPY IMPLANT;  Surgeon: Hollice Espy, MD;  Location: [facility];  Service: Urology;  Laterality: N/A;     reports that he has been smoking. He has a 26.00 pack-year smoking history. He has never used smokeless tobacco. He reports that he does not drink alcohol or use drugs.  Allergies  Allergen Reactions  . Adhesive [Tape] Other (See Comments)    After right leg fracture surgery, pt developed a large blister where tape was applied to his right leg. OK to use paper tape.  . Imdur [Isosorbide Dinitrate] Other (See Comments)    hallucinations  . Singulair [Montelukast Sodium] Other (See Comments)    Hallucinations     Family History  Problem Relation Age of Onset  . Hypertension Mother   . Prostate cancer Brother   . Mental illness Neg Hx     Prior to Admission medications   Medication Sig Start Date End Date Taking? Authorizing Provider  albuterol (PROVENTIL HFA;VENTOLIN HFA) 108 (90 Base) MCG/ACT inhaler Inhale 2 puffs into the lungs every 6 (six) hours as needed for wheezing or shortness of breath. 04/30/18  Yes Pyreddy, Reatha Harps, MD  allopurinol (ZYLOPRIM) 100 MG tablet Take 100 mg by mouth at bedtime.   Yes [provider]  carvedilol (COREG) 12.5 MG tablet Take 12.5 mg by mouth 2 (two) times daily. 12/03/18  Yes [provider]  Cetirizine HCl (ALL DAY ALLERGY PO) Take 1 tablet by mouth daily.   Yes [provider]  cholecalciferol (VITAMIN D) 1000 units tablet Take 1,000 Units by mouth daily.    Yes [provider]  citalopram (CELEXA) 40 MG tablet Take 40 mg by mouth daily. 11/04/18  Yes [provider]  ezetimibe (ZETIA) 10 MG tablet  Take 10 mg by mouth daily. 09/02/18  Yes [provider]  hydrOXYzine (ATARAX/VISTARIL) 10 MG tablet Take 10 mg by mouth at bedtime. 11/04/18  Yes [provider]  losartan (COZAAR) 50 MG tablet Take 50 mg by mouth daily.   Yes [provider]  metoprolol succinate (TOPROL-XL) 25 MG 24 hr tablet Take 25 mg by mouth daily. 05/17/18  Yes [provider]  montelukast (SINGULAIR) 10 MG tablet Take 10 mg by mouth at bedtime.  10/05/16  Yes [provider]  Multiple Vitamin (MULTIVITAMIN) tablet Take 1 tablet by mouth daily. For Men   Yes [provider]  nitroGLYCERIN (NITROSTAT) 0.4 MG SL tablet Place 0.4 mg under the tongue every 5 (five) minutes as needed for chest pain.    Yes [provider]  Omega-3 Fatty Acids (FISH OIL PO) Take 1,000 mg by mouth daily.    Yes [provider]  omeprazole (PRILOSEC) 40 MG capsule Take 40 mg by mouth daily.   Yes [provider]  potassium chloride (K-DUR) 10 MEQ tablet Take 10 mEq by mouth daily.  08/08/16  Yes [provider]  Probiotic Product (PROBIOTIC FORMULA PO) Take 1 capsule by mouth at bedtime.    Yes [provider]  ranolazine (RANEXA) 1000 MG SR tablet Take 1,000 mg by mouth every morning.    Yes [provider]  rivaroxaban (XARELTO) 20 MG TABS tablet Take 20 mg by mouth daily with supper.   Yes [provider]  rOPINIRole (REQUIP) 1 MG tablet Take 1 mg by mouth at bedtime. 11/04/18  Yes [provider]  spironolactone (ALDACTONE) 25 MG tablet Take 25 mg by mouth daily. 11/09/18  Yes [provider]  tamsulosin (FLOMAX) 0.4 MG CAPS capsule TAKE ONE CAPSULE BY MOUTH ONCE DAILY AFTER SUPPER Patient taking differently: Take 0.4 mg by mouth daily after supper.  04/05/18  Yes Chrystal, Eulas Post, MD  traZODone (DESYREL) 150 MG tablet Take 1 tablet (150 mg total) by mouth at bedtime. 02/05/17  Yes Ursula Alert, MD  acetaminophen  (TYLENOL) 325 MG tablet Take 2 tablets (650 mg total) by mouth every 6 (six) hours as needed for mild pain or headache. Patient not taking: Reported on 12/30/2018 10/11/18   Nicholes Mango, MD  atorvastatin (LIPITOR) 80 MG tablet Take 1 tablet (80 mg total) by mouth at bedtime for 30 days. 04/30/18 10/10/18  Saundra Shelling, MD  fluticasone (FLONASE) 50 MCG/ACT nasal spray Place 1 spray into both nostrils daily for 30 days. 04/30/18 05/30/18  Saundra Shelling, MD  gabapentin (NEURONTIN) 300 MG capsule Take 300 mg by mouth as needed (pain).  01/15/17   [provider]  loperamide (IMODIUM) 2 MG capsule Take by mouth as needed for diarrhea or loose stools.    [provider]  mirtazapine (REMERON) 15 MG tablet Take 1 tablet (15 mg total) by mouth at bedtime for 30 days. 04/30/18 05/30/18  Saundra Shelling, MD  REPATHA SURECLICK [id number] MG/ML SOAJ Inject 1 Syringe into the skin every 14 (fourteen) days. 12/10/18   [provider]    Physical Exam: Constitutional: Moderately built and nourished. Vitals:   12/30/18 2330 12/30/18 2345 12/31/18 0000 12/31/18 0255  BP: 139/81 (!) 157/95 (!) 145/88 (!) 149/83  Pulse: 81 80 78 80  Resp: 17 (!) 22 (!) 24 (!) 21  Temp:      TempSrc:      SpO2: 93% 93% 93% 94%  Weight:      Height:       Eyes: Periorbital ecchymosis of the right side. ENMT: Right periorbital ecchymosis. Neck: No neck rigidity. Respiratory: No rhonchi or crepitations. Cardiovascular: S1-S2 heard. Abdomen: Soft nontender bowel sounds present. Musculoskeletal: No edema. Skin: No rash.  Ecchymotic area in the right facial area. Neurologic: Alert awake oriented to time place and person.  Moves all extremities. Psychiatric: Appears normal but normal affect.   Labs on Admission: I have personally reviewed following labs and imaging studies  CBC: Recent Labs  Lab 12/30/18 1640  WBC 9.9  HGB 13.0  HCT 38.8*  MCV 98.7  PLT [id number]   Basic Metabolic Panel: Recent Labs  Lab  12/30/18 1640  NA 137  K 4.0  CL 105  CO2 23  GLUCOSE 172*  BUN 12  CREATININE 1.58*  CALCIUM 9.1   GFR: Estimated Creatinine Clearance: 48.5 mL/min (A) (by C-G formula based on SCr of 1.58 mg/dL (  H)). Liver Function Tests: No results for input(s): AST, ALT, ALKPHOS, BILITOT, PROT, ALBUMIN in the last 168 hours. No results for input(s): LIPASE, AMYLASE in the last 168 hours. No results for input(s): AMMONIA in the last 168 hours. Coagulation Profile: Recent Labs  Lab 12/30/18 1713  INR 2.0*   Cardiac Enzymes: No results for input(s): CKTOTAL, CKMB, CKMBINDEX, TROPONINI in the last 168 hours. BNP (last 3 results) No results for input(s): PROBNP in the last 8760 hours. HbA1C: No results for input(s): HGBA1C in the last 72 hours. CBG: No results for input(s): GLUCAP in the last 168 hours. Lipid Profile: No results for input(s): CHOL, HDL, LDLCALC, TRIG, CHOLHDL, LDLDIRECT in the last 72 hours. Thyroid Function Tests: No results for input(s): TSH, T4TOTAL, FREET4, T3FREE, THYROIDAB in the last 72 hours. Anemia Panel: No results for input(s): VITAMINB12, FOLATE, FERRITIN, TIBC, IRON, RETICCTPCT in the last 72 hours. Urine analysis:    Component Value Date/Time   COLORURINE YELLOW 12/30/2018 2015   APPEARANCEUR CLEAR 12/30/2018 2015   APPEARANCEUR Clear 04/10/2016 1035   LABSPEC 1.020 12/30/2018 2015   PHURINE 5.5 12/30/2018 2015   GLUCOSEU NEGATIVE 12/30/2018 2015   HGBUR TRACE (A) 12/30/2018 2015   BILIRUBINUR NEGATIVE 12/30/2018 2015   BILIRUBINUR Negative 04/10/2016 [place] 12/30/2018 2015   PROTEINUR NEGATIVE 12/30/2018 2015   NITRITE NEGATIVE 12/30/2018 2015   LEUKOCYTESUR NEGATIVE 12/30/2018 2015   Sepsis Labs: @LABRCNTIP (procalcitonin:4,lacticidven:4) )No results found for this or any previous visit (from the past 240 hour(s)).   Radiological Exams on Admission: Ct Head Wo Contrast  Result Date: 12/30/2018 CLINICAL DATA:  Syncopal  episode with head strike EXAM: CT HEAD WITHOUT CONTRAST CT CERVICAL SPINE WITHOUT CONTRAST TECHNIQUE: Multidetector CT imaging of the head and cervical spine was performed following the standard protocol without intravenous contrast. Multiplanar CT image reconstructions of the cervical spine were also generated. COMPARISON:  Brain MRI 05/21/2018, CT head 10/12/2009 FINDINGS: CT HEAD FINDINGS Brain: 6 mm focus of hyperattenuation in the left frontal lobe may reflect a small parenchymal contusion. Additional hyperattenuation is seen in the sulci of the anterior frontal poles and in the base of the right precentral sulcus suggesting some subarachnoid blood. No intraventricular hemorrhage. No mass effect or midline shift. No CT evidence of acute infarct. 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 Patchy areas of white matter hypoattenuation are most compatible with chronic microvascular angiopathy. Symmetric prominence of the ventricles, cisterns and sulci compatible with parenchymal volume loss. Vascular: Atherosclerotic calcification of the carotid siphons. No hyperdense vessel. Skull: Right malar soft tissue swelling. Minimally displaced fractures involving the anterior and lateral walls of the right maxillary sinus. And lateral wall of the right orbit. Question a small cortical step-off for mild diastasis of the right zygomaticomaxillary suture. No calvarial fracture or scalp hematoma. Fragmentation of the nasal bones appears chronic. Sinuses/Orbits: Small volume right maxillary and sphenoid hemosinus. Minimal thickening adjacent the lateral wall right orbital fracture. Globes are intact. Extraocular muscles are normal and symmetric. Other: None CT CERVICAL SPINE FINDINGS Alignment: Cervical stabilization collar is in place. Mild exaggeration of the normal cervical lordosis. No traumatic listhesis. Craniocervical and atlantoaxial alignment is maintained. Skull base and vertebrae: Partial collimation of the  anteroinferior corner of C7. No acute fracture. No primary bone lesion or focal pathologic process. Degenerative changes at the atlantoaxial joint including a small corticated os odontoideum. Soft tissues and spinal canal: No pre or paravertebral fluid or swelling. No visible canal hematoma. Disc levels: Multilevel intervertebral disc  height loss with spondylitic endplate changes. Disc osteophyte complexes, uncinate spurring and facet degenerative changes result in mild right and moderate left foraminal narrowing at both C5-6 and C6-7. Canal narrowing at these levels is at most mild. Upper chest: Biapical pleuroparenchymal scarring Other: Carotid artery vascular calcification in the. IMPRESSION: 1. 6 mm hyperdensity in the left frontal lobe concerning for parenchymal contusion with additional subarachnoid hyperattenuation along the anterior frontal poles and in the base of the right precentral sulcus. 2. Right malar soft tissue swelling and a fracture pattern involving the anteromedial walls right maxillary sinus, right lateral orbital wall, and possible diastasis of the right zygomaticomaxillary suture suspicious for a ZMC type fracture pattern. Consider further evaluation with dedicated facial bone CT. 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 4. No acute cervical spine fracture or traumatic listhesis. 5. Multilevel cervical spondylitic changes maximal from C5 through C7, as detailed above. Critical Value/emergent results were called by telephone at the time of interpretation on 12/30/2018 at 9:10 pm to [place] , who verbally acknowledged these results. Electronically Signed   By: Lovena Le M.D.   On: 12/30/2018 21:10   Ct Cervical Spine Wo Contrast  Result Date: 12/30/2018 CLINICAL DATA:  Syncopal episode with head strike EXAM: CT HEAD WITHOUT CONTRAST CT  CERVICAL SPINE WITHOUT CONTRAST TECHNIQUE: Multidetector CT imaging of the head and cervical spine was performed following the standard protocol without intravenous contrast. Multiplanar CT image reconstructions of the cervical spine were also generated. COMPARISON:  Brain MRI 05/21/2018, CT head 10/12/2009 FINDINGS: CT HEAD FINDINGS Brain: 6 mm focus of hyperattenuation in the left frontal lobe may reflect a small parenchymal contusion. Additional hyperattenuation is seen in the sulci of the anterior frontal poles and in the base of the right precentral sulcus suggesting some subarachnoid blood. No intraventricular hemorrhage. No mass effect or midline shift. No CT evidence of acute infarct. 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 Patchy areas of white matter hypoattenuation are most compatible with chronic microvascular angiopathy. Symmetric prominence of the ventricles, cisterns and sulci compatible with parenchymal volume loss. Vascular: Atherosclerotic calcification of the carotid siphons. No hyperdense vessel. Skull: Right malar soft tissue swelling. Minimally displaced fractures involving the anterior and lateral walls of the right maxillary sinus. And lateral wall of the right orbit. Question a small cortical step-off for mild diastasis of the right zygomaticomaxillary suture. No calvarial fracture or scalp hematoma. Fragmentation of the nasal bones appears chronic. Sinuses/Orbits: Small volume right maxillary and sphenoid hemosinus. Minimal thickening adjacent the lateral wall right orbital fracture. Globes are intact. Extraocular muscles are normal and symmetric. Other: None CT CERVICAL SPINE FINDINGS Alignment: Cervical stabilization collar is in place. Mild exaggeration of the normal cervical lordosis. No traumatic listhesis. Craniocervical and atlantoaxial alignment is maintained. Skull base and vertebrae: Partial collimation of the anteroinferior corner of C7. No acute fracture. No primary bone lesion  or focal pathologic process. Degenerative changes at the atlantoaxial joint including a small corticated os odontoideum. Soft tissues and spinal canal: No pre or paravertebral fluid or swelling. No visible canal hematoma. Disc levels: Multilevel intervertebral disc height loss with spondylitic endplate changes. Disc osteophyte complexes, uncinate spurring and facet degenerative changes result in mild right and moderate left foraminal narrowing at both C5-6 and C6-7. Canal narrowing at these levels is at most mild. Upper chest: Biapical pleuroparenchymal scarring Other: Carotid artery vascular calcification in the. IMPRESSION: 1. 6  mm hyperdensity in the left frontal lobe concerning for parenchymal contusion with additional subarachnoid hyperattenuation along the anterior frontal poles and in the base of the right precentral sulcus. 2. Right malar soft tissue swelling and a fracture pattern involving the anteromedial walls right maxillary sinus, right lateral orbital wall, and possible diastasis of the right zygomaticomaxillary suture suspicious for a ZMC type fracture pattern. Consider further evaluation with dedicated facial bone CT. 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 4. No acute cervical spine fracture or traumatic listhesis. 5. Multilevel cervical spondylitic changes maximal from C5 through C7, as detailed above. Critical Value/emergent results were called by telephone at the time of interpretation on 12/30/2018 at 9:10 pm to [place] , who verbally acknowledged these results. Electronically Signed   By: Lovena Le M.D.   On: 12/30/2018 21:10   Ct Maxillofacial Wo Contrast  Result Date: 12/31/2018 CLINICAL DATA:  73 year old male with facial trauma. EXAM: CT MAXILLOFACIAL WITHOUT CONTRAST TECHNIQUE: Multidetector CT imaging of the maxillofacial structures  was performed. Multiplanar CT image reconstructions were also generated. COMPARISON:  Head CT dated 12/30/2018 FINDINGS: Osseous: Minimally displaced fracture of the anterior wall of the right maxillary sinus and minimally angulated fracture of the posterolateral wall of the right maxillary sinus. There is a minimally depressed fracture of the right orbital floor. Minimally displaced fracture or sutural dehiscence of the lateral wall of the right orbit. There is irregularity of the tip of the nasal bone concerning for a minimally displaced fracture. No mandibular dislocation. Orbits: The globes and retro-orbital fat are preserved. Sinuses: Probable blood product in the right maxillary sinus. Mild mucoperiosteal thickening of ethmoid air cells and right sphenoid sinus. The mastoid air cells are clear. Soft tissues: Right forehead and periorbital contusion. Limited intracranial: No significant or unexpected finding. IMPRESSION: Fractures of the right maxillary sinus and orbital wall as described as well as fracture of the tip of the nasal bone. Electronically Signed   By: Anner Crete M.D.   On: 12/31/2018 00:41     Assessment/Plan Principal Problem:   Syncope Active Problems:   CAD (coronary artery disease)   Hypertension   Malignant neoplasm of prostate (HCC)   Intracranial bleed ([place])    1. Syncope -cause not clear.  Monitor showing sinus rhythm.  EKG is pending.  Last 2D echo done in February 2020 showed EF of 45 to 50% with cardiac catheter in August 2020 showed normal coronaries with EF of 45%.  Will monitor in telemetry.  Cardiology consult. 2. Acute renal failure will hold off spironolactone and ARB for now.  Patient received 1 L fluid bolus in the ER for repeat metabolic panel. 3. Hypertension on Coreg.  Holding ARB due to acute renal failure.  As needed IV hydralazine. 4. History of DVT on Xarelto.  Xarelto on hold due to out cerebral contusion and possible subarachnoid  hemorrhage. 5. Right maxillary and orbital and nasal bone fracture being followed by trauma surgeon. 6. Right-sided frontal contusion and subarachnoid hemorrhage for which neurosurgery Dr. Valarie Merino has been consulted. 7. Recent admission in August 2020 for non-ST elevation MI. 8. COPD not actively wheezing.  COVID-19 test is pending.   DVT prophylaxis: SCDs due to intracerebral bleed. Code Status: Full code. Family Communication: Discussed with patient. Disposition Plan: Home. Consults called: Trauma service neurosurgery.  Cardiology. Admission status: Observation.   Rise Patience MD [facility] Pager [phone].  If 7PM-7AM, please contact night-coverage [email or web address] Password [email or web address]  12/31/2018, 5:19 AM

## 2018-12-31 NOTE — Consult Note (Addendum)
Cardiology Consultation:   Patient ID: Bruce [name] MRN: [id number]; DOB: 19-Sep-1945  Admit date: 12/30/2018 Date of Consult: 12/31/2018  Primary Care Provider: Danelle Berry, NP Primary Cardiologist: Dr. Humphrey Rolls  Patient Profile:   Bruce [name] is a 73 y.o. male with a hx of coronary artery disease, paroxysmal atrial fibrillation, chronic systolic heart failure, COPD, hypertension, hyperlipidemia, and ongoing tobacco smoking who is being seen today for the evaluation of syncope at the request of Dr. Tana Coast.  Patient followed by Dr. Humphrey Rolls for his cardiac care.  He reports having heart attack in 2009.  Unsure if he had a stent placed or not.  Cath report in 2012 showed no significant heart disease but no comment on stent.  Most recently, patient had coronary angiography October 11, 2018 for non-STEMI which revealed mild 35% mid LAD lesion.  Moderate LV systolic function.  There was no comment on prior stent.  Last echocardiogram February 2020 showed mildly reduced systolic function to 45 to 50%, impaired diastolic function, mild mitral regurgitation.  History of Present Illness:   Mr. Bruce [name] presented after syncope episode while helping a friend lift heavy item into a truck.  He remembers going to the car and passed out and fall.  He denies prodromal symptoms.  He denies chest pain, shortness of breath, palpitation or dizziness before or after syncope episode.  He takes Xarelto for DVT per H&P this admission however Dr. Sharol Roussel H&P 10/11/2018 indicates patient has history of atrial fibrillation.  CT C-spine, head and maxillofacial showed right maxillary orbital and nasal bone fracture.  CT head showed left frontal contusion with subarachnoid hemorrhage.  UDS positive for marijuana.  He was admitted for syncope and subdural hematoma.  Covid negative.  Patient reports history of atrial fibrillation for many years.  He feels intermittent fluttering sensation lasting for few minutes.  He did not  had fluttering sensation prior to his syncope.  Reports walking every day without chest pain or shortness of breath.  No orthopnea or PND.  No lower extremity edema.  Heart Pathway Score:     Past Medical History:  Diagnosis Date  . Anginal pain ([place])    Left side if chest ,NTG  relieves chaes apin 12/01/13  . Anxiety   . Arthritis   . Cancer [facility])    colon cancer  . CHF (congestive heart failure) ([place])   . 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 Complication of anesthesia    wake up with a head ache  . COPD (chronic obstructive pulmonary disease) ([place])   . Coronary artery disease   . GERD (gastroesophageal reflux disease)   . Headache    related to sinus congestion  . History of blood transfusion   . History of kidney stones   . Hypertension   . Myocardial infarction [facility])    '09 AND '12  . Peripheral vascular disease ([place])   . Shortness of breath    With exertion .  Marland Kitchen UTI (urinary tract infection)    frequent UTI    Past Surgical History:  Procedure Laterality Date  . CARDIAC CATHETERIZATION    . CHOLECYSTECTOMY    . EXTERNAL FIXATION LEG Right 10/06/2013   Procedure: CLOSED REDUCTION RIGHT TIBIAL PLATEAU FRACTURE, EXTERNAL FIXATION RIGHT LEG, PLACEMENT OF WOUND VAC;  Surgeon: Rozanna Box, MD;  Location: [place];  Service: Orthopedics;  Laterality: Right;  . FEMORAL-POPLITEAL BYPASS GRAFT Right 10/06/2013   Procedure: RIGHT POPLITEAL-POPLITEAL ARTERY  BYPASS GRAFT;  Surgeon: Rosetta Posner, MD;  Location: [place];  Service: Vascular;  Laterality: Right;  . FRACTURE SURGERY Right 2014  . HARDWARE REMOVAL Right 12/02/2013   Procedure: REMOVAL EXTERNAL FIXATION RIGHT LEG ;  Surgeon: Rozanna Box, MD;  Location: [place];  Service: Orthopedics;  Laterality: Right;  . HERNIA REPAIR Right 1990's  . I&D EXTREMITY Right 10/09/2013   Procedure: IRRIGATION AND DEBRIDEMENT RIGHT LEG, CLOSURE  OF WOUNDS, PLACEMENT OF WOUND VAC ON EACH SIDE OF  LEG;  Surgeon: Rozanna Box, MD;  Location: [place];  Service: Orthopedics;  Laterality: Right;  . IR NEPHROSTOMY PLACEMENT LEFT  06/19/2016  . IVC filter  2009   placed @ [facility]/ Removed in 2010.  . IVC Filter Removed    . LEFT HEART CATH AND CORONARY ANGIOGRAPHY Right 10/11/2018   Procedure: LEFT HEART CATH AND CORONARY ANGIOGRAPHY;  Surgeon: [name], MD;  Location: [place] [facility];  Service: Cardiovascular;  Laterality: Right;  . ligament leg Left   . NEPHROLITHOTOMY Left 06/19/2016   Procedure: NEPHROLITHOTOMY PERCUTANEOUS;  Surgeon: Hollice Espy, MD;  Location: [facility];  Service: Urology;  Laterality: Left;  . ORIF TIBIA FRACTURE Right 12/29/2014   Procedure: OPEN REDUCTION INTERNAL FIXATION (ORIF) RIGHT TIBIA FRACTURE, RIA VS ICBG;  Surgeon: Altamese [place], MD;  Location: [place];  Service: Orthopedics;  Laterality: Right;  . RADIOACTIVE SEED IMPLANT N/A 09/12/2016   Procedure: RADIOACTIVE SEED IMPLANT/BRACHYTHERAPY IMPLANT;  Surgeon: Hollice Espy, MD;  Location: [facility];  Service: Urology;  Laterality: N/A;     Inpatient Medications: Scheduled Meds: . allopurinol  100 mg Oral QHS  . atorvastatin  80 mg Oral QHS  . carvedilol  12.5 mg Oral BID WC  . citalopram  40 mg Oral Daily  . ezetimibe  10 mg Oral Daily  . mirtazapine  15 mg Oral QHS  . montelukast  10 mg Oral QHS  . ranolazine  1,000 mg Oral Daily  . rOPINIRole  1 mg Oral QHS  . sodium chloride flush  3 mL Intravenous Once  . tamsulosin  0.4 mg Oral QPC supper  . traZODone  150 mg Oral QHS   Continuous Infusions:  PRN Meds: acetaminophen **OR** acetaminophen, albuterol, nitroGLYCERIN, ondansetron **OR** ondansetron (ZOFRAN) IV  Allergies:    Allergies  Allergen Reactions  . Adhesive [Tape] Other (See Comments)    After right leg fracture surgery, pt developed a large blister where tape was applied to his right leg. OK to use paper tape.  . Imdur [Isosorbide Dinitrate] Other (See Comments)     hallucinations  . Singulair [Montelukast Sodium] Other (See Comments)    Hallucinations     Social History:   Social History   Socioeconomic History  . Marital status: Single    Spouse name: Not on file  . Number of children: 2  . Years of education: Not on file  . Highest education level: 6th grade  Occupational History  . Not on file  Social Needs  . Financial resource strain: Very hard  . Food insecurity    Worry: Often true    Inability: Often true  . Transportation needs    Medical: No    Non-medical: No  Tobacco Use  . Smoking status: Current Some Day Smoker    Packs/day: 0.50    Years: 52.00    Pack years: 26.00  . Smokeless tobacco: Never Used  Substance and Sexual Activity  . Alcohol use: No  Alcohol/week: 0.0 standard drinks    Comment: Stopped 2009  . Drug use: No  . Sexual activity: Not Currently  Lifestyle  . Physical activity    Days per week: 0 days    Minutes per session: 0 min  . Stress: Rather much  Relationships  . Social connections    Talks on phone: More than three times a week    Gets together: Once a week    Attends religious service: Never    Active member of club or organization: No    Attends meetings of clubs or organizations: Never    Relationship status: Divorced  . Intimate partner violence    Fear of current or ex partner: No    Emotionally abused: No    Physically abused: No    Forced sexual activity: No  Other Topics Concern  . Not on file  Social History Narrative   ** Merged History Encounter **        Family History:   Family History  Problem Relation Age of Onset  . Hypertension Mother   . Prostate cancer Brother   . Mental illness Neg Hx      ROS:  Please see the history of present illness.  All other ROS reviewed and negative.     Physical Exam/Data:   Vitals:   12/31/18 0830 12/31/18 0939 12/31/18 1000 12/31/18 1100  BP: 128/78 (!) 133/95 139/85 130/88  Pulse: 65 63 66 63  Resp: 17 19 15 20    Temp:      TempSrc:      SpO2: 96% 95% 91% 93%  Weight:      Height:        Intake/Output Summary (Last 24 hours) at 12/31/2018 1318 Last data filed at 12/31/2018 0005 Gross per 24 hour  Intake 1000 ml  Output -  Net 1000 ml   Last 3 Weights 12/30/2018 10/11/2018 10/10/2018  Weight (lbs) 178 lb 171 lb 171 lb 8 oz  Weight (kg) 80.74 kg 77.565 kg 77.792 kg  Some encounter information is confidential and restricted. Go to Review Flowsheets activity to see all data.     Body mass index is 23.48 kg/m.  General:  Well nourished, well developed, in no acute distress HEENT: Periorbital ecchymosis on right side, scattered ecchymosis on right side of face Lymph: no adenopathy Neck: no JVD Endocrine:  No thryomegaly Vascular: No carotid bruits; FA pulses 2+ bilaterally without bruits  Cardiac:  normal S1, S2; RRR; no murmur Lungs:  clear to auscultation bilaterally, no wheezing, rhonchi or rales  Abd: soft, nontender, no hepatomegaly  Ext: no edema Musculoskeletal:  No deformities, BUE and BLE strength normal and equal Skin: warm and dry  Neuro:  CNs 2-12 intact, no focal abnormalities noted Psych:  Normal affect   EKG:  The EKG was personally reviewed and demonstrates: Sinus rhythm at rate of 77 bpm  Relevant CV Studies:  Echo 04/29/2018  1. The left ventricle has mildly reduced systolic function, with an ejection fraction of 45-50%. The cavity size was normal. Left ventricular diastolic Doppler parameters are consistent with impaired relaxation.  2. The right ventricle has normal systolic function. The cavity was normal. There is no increase in right ventricular wall thickness.  3. The mitral valve is myxomatous. Mild thickening of the mitral valve leaflet. Mild calcification of the mitral valve leaflet.  4. The tricuspid valve is normal in structure.  5. The aortic valve is tricuspid Mild thickening of the aortic valve Moderate calcification  of the aortic valve.  6. The pulmonic  valve was normal in structure.   LEFT HEART CATH AND CORONARY ANGIOGRAPHY 10/11/2018  Conclusion    Mid LAD lesion is 35% stenosed.   Mild luminor irregularities, with moderate LV systolic dysfunction.     Laboratory Data:  High Sensitivity Troponin:   Recent Labs  Lab 12/31/18 1146  TROPONINIHS 8     Chemistry Recent Labs  Lab 12/30/18 1640 12/31/18 0551  NA 137 140  K 4.0 4.1  CL 105 110  CO2 23 23  GLUCOSE 172* 108*  BUN 12 10  CREATININE 1.58* 1.25*  CALCIUM 9.1 9.4  GFRNONAA 43* 58*  GFRAA 50* >60  ANIONGAP 9 7    Recent Labs  Lab 12/31/18 0551  PROT 6.4*  ALBUMIN 3.6  AST 30  ALT 28  ALKPHOS 70  BILITOT 0.8   Hematology Recent Labs  Lab 12/30/18 1640 12/31/18 0551  WBC 9.9 7.2  RBC 3.93* 4.14*  HGB 13.0 13.5  HCT 38.8* 40.8  MCV 98.7 98.6  MCH 33.1 32.6  MCHC 33.5 33.1  RDW 12.7 12.9  PLT 197 203    Radiology/Studies:  Ct Head Wo Contrast  Result Date: 12/30/2018 CLINICAL DATA:  Syncopal episode with head strike EXAM: CT HEAD WITHOUT CONTRAST CT CERVICAL SPINE WITHOUT CONTRAST TECHNIQUE: Multidetector CT imaging of the head and cervical spine was performed following the standard protocol without intravenous contrast. Multiplanar CT image reconstructions of the cervical spine were also generated. COMPARISON:  Brain MRI 05/21/2018, CT head 10/12/2009 FINDINGS: CT HEAD FINDINGS Brain: 6 mm focus of hyperattenuation in the left frontal lobe may reflect a small parenchymal contusion. Additional hyperattenuation is seen in the sulci of the anterior frontal poles and in the base of the right precentral sulcus suggesting some subarachnoid blood. No intraventricular hemorrhage. No mass effect or midline shift. No CT evidence of acute infarct. 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 Patchy areas of white matter hypoattenuation are most compatible with chronic microvascular angiopathy. Symmetric prominence of the ventricles, cisterns and sulci compatible  with parenchymal volume loss. Vascular: Atherosclerotic calcification of the carotid siphons. No hyperdense vessel. Skull: Right malar soft tissue swelling. Minimally displaced fractures involving the anterior and lateral walls of the right maxillary sinus. And lateral wall of the right orbit. Question a small cortical step-off for mild diastasis of the right zygomaticomaxillary suture. No calvarial fracture or scalp hematoma. Fragmentation of the nasal bones appears chronic. Sinuses/Orbits: Small volume right maxillary and sphenoid hemosinus. Minimal thickening adjacent the lateral wall right orbital fracture. Globes are intact. Extraocular muscles are normal and symmetric. Other: None CT CERVICAL SPINE FINDINGS Alignment: Cervical stabilization collar is in place. Mild exaggeration of the normal cervical lordosis. No traumatic listhesis. Craniocervical and atlantoaxial alignment is maintained. Skull base and vertebrae: Partial collimation of the anteroinferior corner of C7. No acute fracture. No primary bone lesion or focal pathologic process. Degenerative changes at the atlantoaxial joint including a small corticated os odontoideum. Soft tissues and spinal canal: No pre or paravertebral fluid or swelling. No visible canal hematoma. Disc levels: Multilevel intervertebral disc height loss with spondylitic endplate changes. Disc osteophyte complexes, uncinate spurring and facet degenerative changes result in mild right and moderate left foraminal narrowing at both C5-6 and C6-7. Canal narrowing at these levels is at most mild. Upper chest: Biapical pleuroparenchymal scarring Other: Carotid artery vascular calcification in the. IMPRESSION: 1. 6 mm hyperdensity in the left frontal lobe concerning for parenchymal contusion with additional subarachnoid  hyperattenuation along the anterior frontal poles and in the base of the right precentral sulcus. 2. Right malar soft tissue swelling and a fracture pattern involving the  anteromedial walls right maxillary sinus, right lateral orbital wall, and possible diastasis of the right zygomaticomaxillary suture suspicious for a ZMC type fracture pattern. Consider further evaluation with dedicated facial bone CT. 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 4. No acute cervical spine fracture or traumatic listhesis. 5. Multilevel cervical spondylitic changes maximal from C5 through C7, as detailed above. Critical Value/emergent results were called by telephone at the time of interpretation on 12/30/2018 at 9:10 pm to [place] , who verbally acknowledged these results. Electronically Signed   By: Lovena Le M.D.   On: 12/30/2018 21:10   Ct Cervical Spine Wo Contrast  Result Date: 12/30/2018 CLINICAL DATA:  Syncopal episode with head strike EXAM: CT HEAD WITHOUT CONTRAST CT CERVICAL SPINE WITHOUT CONTRAST TECHNIQUE: Multidetector CT imaging of the head and cervical spine was performed following the standard protocol without intravenous contrast. Multiplanar CT image reconstructions of the cervical spine were also generated. COMPARISON:  Brain MRI 05/21/2018, CT head 10/12/2009 FINDINGS: CT HEAD FINDINGS Brain: 6 mm focus of hyperattenuation in the left frontal lobe may reflect a small parenchymal contusion. Additional hyperattenuation is seen in the sulci of the anterior frontal poles and in the base of the right precentral sulcus suggesting some subarachnoid blood. No intraventricular hemorrhage. No mass effect or midline shift. No CT evidence of acute infarct. 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 Patchy areas of white matter hypoattenuation are most compatible with chronic microvascular angiopathy. Symmetric prominence of the ventricles, cisterns and sulci compatible with parenchymal volume loss. Vascular: Atherosclerotic calcification of the  carotid siphons. No hyperdense vessel. Skull: Right malar soft tissue swelling. Minimally displaced fractures involving the anterior and lateral walls of the right maxillary sinus. And lateral wall of the right orbit. Question a small cortical step-off for mild diastasis of the right zygomaticomaxillary suture. No calvarial fracture or scalp hematoma. Fragmentation of the nasal bones appears chronic. Sinuses/Orbits: Small volume right maxillary and sphenoid hemosinus. Minimal thickening adjacent the lateral wall right orbital fracture. Globes are intact. Extraocular muscles are normal and symmetric. Other: None CT CERVICAL SPINE FINDINGS Alignment: Cervical stabilization collar is in place. Mild exaggeration of the normal cervical lordosis. No traumatic listhesis. Craniocervical and atlantoaxial alignment is maintained. Skull base and vertebrae: Partial collimation of the anteroinferior corner of C7. No acute fracture. No primary bone lesion or focal pathologic process. Degenerative changes at the atlantoaxial joint including a small corticated os odontoideum. Soft tissues and spinal canal: No pre or paravertebral fluid or swelling. No visible canal hematoma. Disc levels: Multilevel intervertebral disc height loss with spondylitic endplate changes. Disc osteophyte complexes, uncinate spurring and facet degenerative changes result in mild right and moderate left foraminal narrowing at both C5-6 and C6-7. Canal narrowing at these levels is at most mild. Upper chest: Biapical pleuroparenchymal scarring Other: Carotid artery vascular calcification in the. IMPRESSION: 1. 6 mm hyperdensity in the left frontal lobe concerning for parenchymal contusion with additional subarachnoid hyperattenuation along the anterior frontal poles and in the base of the right precentral sulcus. 2. Right malar soft tissue swelling and a fracture pattern involving the anteromedial walls right maxillary sinus, right lateral orbital wall, and  possible diastasis of the right zygomaticomaxillary suture suspicious for a ZMC type fracture pattern. Consider  further evaluation with dedicated facial bone CT. 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 4. No acute cervical spine fracture or traumatic listhesis. 5. Multilevel cervical spondylitic changes maximal from C5 through C7, as detailed above. Critical Value/emergent results were called by telephone at the time of interpretation on 12/30/2018 at 9:10 pm to [place] , who verbally acknowledged these results. Electronically Signed   By: Lovena Le M.D.   On: 12/30/2018 21:10   Ct Maxillofacial Wo Contrast  Result Date: 12/31/2018 CLINICAL DATA:  73 year old male with facial trauma. EXAM: CT MAXILLOFACIAL WITHOUT CONTRAST TECHNIQUE: Multidetector CT imaging of the maxillofacial structures was performed. Multiplanar CT image reconstructions were also generated. COMPARISON:  Head CT dated 12/30/2018 FINDINGS: Osseous: Minimally displaced fracture of the anterior wall of the right maxillary sinus and minimally angulated fracture of the posterolateral wall of the right maxillary sinus. There is a minimally depressed fracture of the right orbital floor. Minimally displaced fracture or sutural dehiscence of the lateral wall of the right orbit. There is irregularity of the tip of the nasal bone concerning for a minimally displaced fracture. No mandibular dislocation. Orbits: The globes and retro-orbital fat are preserved. Sinuses: Probable blood product in the right maxillary sinus. Mild mucoperiosteal thickening of ethmoid air cells and right sphenoid sinus. The mastoid air cells are clear. Soft tissues: Right forehead and periorbital contusion. Limited intracranial: No significant or unexpected finding. IMPRESSION: Fractures of the right maxillary sinus and orbital  wall as described as well as fracture of the tip of the nasal bone. Electronically Signed   By: Anner Crete M.D.   On: 12/31/2018 00:41    Assessment and Plan:   1. Syncope Unknown etiology.  Differential includes arrhythmia or orthostatic hypotension.  Recent cardiac cath did not show obstructive disease.  He could have atrial fibrillation with pauses leading to syncope.  He is maintaining sinus rhythm since here.  He reports intermittent fluttering sensation and history of A. fib as summarized above.  Recommended 30-day event monitor.  He has appointment with Dr. Humphrey Rolls this Friday.  2.  Paroxysmal atrial fibrillation - He takes Xarelto for DVT per H&P this admission however Dr. Sharol Roussel H&P 10/11/2018 indicates patient has history of atrial fibrillation. -Patient does endorse history of atrial fibrillation for many years.  Intermittent fluttering sensation. -Resume Xarelto as direction per neurosurgery given subarachnoid hematoma  3.  Tobacco and marijuana abuse -Advised cessation  4.  Right-sided frontal contusion, subarachnoid hemorrhage -Per primary team  5.  AKI -Renal function improved with hydration.  Get echo and orthostatic. IF normal LV function without WM abnormality. No impatient cardiac workup. He has appointment with Dr. Humphrey Rolls this Friday. Recommended 30 days event monitor with him.   Dr. Radford Pax to see.      For questions or updates, please contact [place] Please consult [email or web address] for contact info under     Jarrett Soho, PA  12/31/2018 1:18 PM

## 2019-01-01 ENCOUNTER — Inpatient Hospital Stay (HOSPITAL_COMMUNITY): Payer: Medicare Other

## 2019-01-01 DIAGNOSIS — I10 Essential (primary) hypertension: Secondary | ICD-10-CM | POA: Diagnosis not present

## 2019-01-01 DIAGNOSIS — R55 Syncope and collapse: Secondary | ICD-10-CM | POA: Diagnosis not present

## 2019-01-01 DIAGNOSIS — I2510 Atherosclerotic heart disease of native coronary artery without angina pectoris: Secondary | ICD-10-CM | POA: Diagnosis not present

## 2019-01-01 LAB — HEMOGLOBIN A1C
Hgb A1c MFr Bld: 5.5 % (ref 4.8–5.6)
Mean Plasma Glucose: 111.15 mg/dL

## 2019-01-01 LAB — LIPID PANEL
Cholesterol: 70 mg/dL (ref 0–200)
HDL: 41 mg/dL (ref >40)
LDL Cholesterol: 22 mg/dL (ref 0–99)
Total CHOL/HDL Ratio: 1.7 RATIO
Triglycerides: 36 mg/dL (ref <150)
VLDL: 7 mg/dL (ref 0–40)

## 2019-01-01 LAB — BASIC METABOLIC PANEL
Anion gap: 7 (ref 5–15)
BUN: 11 mg/dL (ref 8–23)
CO2: 23 mmol/L (ref 22–32)
Calcium: 9 mg/dL (ref 8.9–10.3)
Chloride: 108 mmol/L (ref 98–111)
Creatinine, Ser: 1.15 mg/dL (ref 0.61–1.24)
GFR calc Af Amer: >60 mL/min (ref >60)
GFR calc non Af Amer: >60 mL/min (ref >60)
Glucose, Bld: 105 mg/dL — ABNORMAL HIGH (ref 70–99)
Potassium: 3.8 mmol/L (ref 3.5–5.1)
Sodium: 138 mmol/L (ref 135–145)

## 2019-01-01 NOTE — Progress Notes (Signed)
Physical Therapy Treatment Patient Details Name: Bruce [name] MRN: [id number] DOB: December 18, 1945 Today's Date: 01/01/2019    History of Present Illness 73 yo male with onset of syncope and fall from no known cause was admitted to ED.  Has a facial fracture of R maxillary, orbital and nasal bones.  Has subarachnoid hemorrhage, R frontal contusion and will have neuro follow.  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   Had marijuana in system.  PMHx:  angina, MI, PVD, COPD, colon CA, anxiety, CHF, 2016 R tibial fracture, UTI, transfusion    PT Comments    Pt is mobilizing well with SPC and progressing towards goals. Patient would benefit from continued skilled PT to maximize functional independence and safety with mobility. Plan for stairs next session.     Follow Up Recommendations  Outpatient PT     Equipment Recommendations  None recommended by PT(has a SPC and RW)    Recommendations for Other Services       Precautions / Restrictions Precautions Precautions: Fall Precaution Comments: unsteady with no AD Restrictions Weight Bearing Restrictions: No    Mobility  Bed Mobility Overal bed mobility: Modified Independent                Transfers Overall transfer level: Modified independent Equipment used: None             General transfer comment: stands from bedside with no issues  Ambulation/Gait Ambulation/Gait assistance: Min guard Gait Distance (Feet): 150 Feet Assistive device: Straight cane Gait Pattern/deviations: Step-through pattern;Wide base of support;Decreased stance time - right;Decreased stride length Gait velocity: reduced   General Gait Details: Pt with good technique with cane after initial cues for sequencing. Slow gait, pt fearful of falling again.   Stairs             Wheelchair Mobility    Modified Rankin (Stroke Patients Only)       Balance Overall balance assessment: History of Falls                                           Cognition Arousal/Alertness: Awake/alert Behavior During Therapy: WFL for tasks assessed/performed Overall Cognitive Status: Within Functional Limits for tasks assessed                                        Exercises      General Comments        Pertinent Vitals/Pain Pain Assessment: Faces Faces Pain Scale: Hurts little more Pain Location: BIL shoulders, Head Pain Descriptors / Indicators: Grimacing Pain Intervention(s): Monitored during session;Limited activity within patient's tolerance;Repositioned    Home Living                      Prior Function            PT Goals (current goals can now be found in the care plan section) Acute Rehab PT Goals Patient Stated Goal: to walk and recover his comfort on R knee PT Goal Formulation: With patient Time For Goal Achievement: 01/14/19 Potential to Achieve Goals: Good Progress towards PT goals: Progressing toward goals    Frequency    Min 3X/week      PT Plan Current plan remains appropriate    Co-evaluation  AM-PAC PT "6 Clicks" Mobility   Outcome Measure  Help needed turning from your back to your side while in a flat bed without using bedrails?: None Help needed moving from lying on your back to sitting on the side of a flat bed without using bedrails?: None Help needed moving to and from a bed to a chair (including a wheelchair)?: A Little Help needed standing up from a chair using your arms (e.g., wheelchair or bedside chair)?: A Little Help needed to walk in hospital room?: A Little Help needed climbing 3-5 steps with a railing? : A Little 6 Click Score: 20    End of Session Equipment Utilized During Treatment: Gait belt Activity Tolerance: Patient tolerated treatment well(balance changes in part from proprioceptive chang R knee) Patient left: with call bell/phone within reach;in chair Nurse Communication: Mobility status PT Visit Diagnosis: Unsteadiness  on feet (R26.81);Muscle weakness (generalized) (M62.81);Difficulty in walking, not elsewhere classified (R26.2);Pain Pain - Right/Left: Right Pain - part of body: Knee     Time: [id number] PT Time Calculation (min) (ACUTE ONLY): 21 min  Charges:  $Gait Training: 8-22 mins                    Benjiman Core, [place] Pager [phone] Acute Rehab   Allena Katz 01/01/2019, 4:01 PM

## 2019-01-01 NOTE — Procedures (Signed)
Patient Name: Bruce [name]  MRN: [id number]  Epilepsy Attending: Lora Havens  Referring Physician/Provider: Dr Irene Pap Date: 01/01/2019 Duration: 23.46 mins  Patient history: 73yo m with syncope. EEG to evaluate for seizure  Level of alertness: awake  AEDs during EEG study: None  Technical aspects: This EEG study was done with scalp electrodes positioned according to the 10-20 International system of electrode placement. Electrical activity was acquired at a sampling rate of 500Hz  and reviewed with a high frequency filter of 70Hz  and a low frequency filter of 1Hz . EEG data were recorded continuously and digitally stored.   DESCRIPTION:  The posterior dominant rhythm consists of 10-11 Hz activity of moderate voltage (25-35 uV) seen predominantly in posterior head regions, symmetric and reactive to eye opening and eye closing. There is also intermittent 2-3Hz  delta slowing. Physiologic photic driving was seen during photic stimulation.      Hyperventilation was not performed.  ABNORMALITY - Intermittent slow, left hemisphere  IMPRESSION: This study is suggestive of cortical dysfunction in left hemisphere, could be secondary to underlying contusion. No seizures or epileptiform discharges were seen throughout the recording.  Hajira Verhagen Barbra Sarks

## 2019-01-01 NOTE — Progress Notes (Signed)
PROGRESS NOTE  Bruce [name] [id number] DOB: [birth year] DOA: 12/30/2018 PCP: Danelle Berry, NP  HPI/Recap of past 24 hours:  Bruce [name] is a 73 y.o. male with history of chronic systolic heart failure, COPD, hypertension, admission for non-ST elevation MI in August 2020 at [facility] during which cardiac cath did not show any significant coronary artery obstruction had a syncopal episode last evening while walking to the parking lot.  Patient states he did not have any prodromal symptoms did not have any chest pain palpitation nausea vomiting or diarrhea.  He said he lost consciousness and hit his face.  Bystanders had to call EMS by then he regained his consciousness.  Patient did not have any incontinence of urine or any tongue bite.  ED Course: In the ER monitor was showing sinus rhythm.  CT scan of the C-spine head and maxillofacial was done.  Shows right maxillary orbital and nasal bone fracture.  CT head was showing left frontal contusion with subarachnoid hemorrhage.  On-call trauma surgeon and neurosurgeon Dr. Christella Noa were consulted.  Urine drug screen is positive for marijuana.  Labs show creatinine 1.5 increased from baseline hemoglobin 13 platelets 197 INR 2.  COVID-19 is pending.  Patient admitted for syncope with multiple fractures involving the facial area with contusion of the brain with possible hemorrhage.  01/01/19: Patient was seen and examined at his bedside this morning.  He reports last fall prior to the one that led to this admission, was 3 months ago.  This time, experienced dizziness briefly prior to falling.  Patient has a history of CVA noted on MRI done on 05/21/2018 affecting the right cerebellum.  Curb sided and discussed with neurology Dr. Leonel Ramsay, this syncopal event is less likely neurological.  Will obtain an EEG as part of syncope work-up.   Assessment/Plan: Principal Problem:   Syncope Active Problems:   CAD (coronary  artery disease)   Hypertension   Malignant neoplasm of prostate (HCC)   Intracranial bleed (HCC)  Syncope with facial and head trauma Suspect cardiac etiology  TTE done on 12/31/2018 showed decreased LVEF 40 to 45% and diffuse hypokinesis Seen by cardiology, following. Last 2D echo done in February 2020 showed EF of 45 to 50% with cardiac catheter in August 2020 showed normal coronaries with EF of 45%. -Order EEG as part of syncope work-up Continue to monitor on telemetry  History of right cerebellar CVA Seen on MRI done on 05/21/2018, independently viewed Patient was not aware Will obtain a lipid panel and A1c Recently on Xarelto for paroxysmal A. Fib, last dose was taken yesterday the day of his fall.  Paroxysmal A. fib on Xarelto Continue to hold off Hamel due to recent head trauma  Rate is currently controlled and in sinus  Fractures of the right maxillary sinus and orbital wall/fracture of the tip of the nasal bone. Seen by ENT.  Appreciate expertise. Pain control  Resolving AKI likely prerenal in the setting of poor oral intake. Baseline creatinine appears to be 0.9 with GFR greater than 60 Presented with creatinine of 1.5 Creatinine improving 1.15 with GFR greater than 60 Avoid nephrotoxins like NSAIDs Avoid hypotension and maintain map greater than 65 for renal perfusion  Essential hypertension Blood pressure is at goal Continue to hold off ARB Continue Coreg Continue to monitor vital signs  Right-sided frontal contusion and subarachnoid hemorrhage for which neurosurgery consulted.  No planned procedures.  Recent admission in August 2020 for non-ST elevation MI.  Continue home medications.  Chronic anxiety/depression Continue home medications  COPD not actively wheezing.  Continue home medications.    DVT prophylaxis: SCDs due to intracerebral bleed. Code Status: Full code. Family Communication: Discussed with patient. Disposition Plan: Home. Consults  called: Trauma service neurosurgery.  Cardiology.   Objective: Vitals:   12/31/18 2326 01/01/19 0355 01/01/19 0805 01/01/19 1231  BP: 118/65 114/60 130/74 111/63  Pulse: 69 64 64 63  Resp: 18 18 18 18   Temp: 98.7 F (37.1 C) 98.3 F (36.8 C) 98.4 F (36.9 C) 98.5 F (36.9 C)  TempSrc: Oral Oral Oral Oral  SpO2: 99% 100% 93% 90%  Weight:      Height:        Intake/Output Summary (Last 24 hours) at 01/01/2019 1534 Last data filed at 12/31/2018 1942 Gross per 24 hour  Intake -  Output 650 ml  Net -650 ml   Filed Weights   12/30/18 1637  Weight: 80.7 kg    Exam:  . General: 73 y.o. year-old male well developed well nourished in no acute distress.  Alert and oriented x4. . Cardiovascular: Regular rate and rhythm with no rubs or gallops.  No thyromegaly or JVD noted.   Marland Kitchen Respiratory: Clear to auscultation with no wheezes or rales. Good inspiratory effort. . Abdomen: Soft nontender nondistended with normal bowel sounds x4 quadrants. . Musculoskeletal: No lower extremity edema. 2/4 pulses in all 4 extremities. . Skin: Right-sided facial bruising noted . Psychiatry: Mood is appropriate for condition and setting   Data Reviewed: CBC: Recent Labs  Lab 12/30/18 1640 12/31/18 0551  WBC 9.9 7.2  NEUTROABS  --  5.5  HGB 13.0 13.5  HCT 38.8* 40.8  MCV 98.7 98.6  PLT 197 [id number]   Basic Metabolic Panel: Recent Labs  Lab 12/30/18 1640 12/31/18 0551 01/01/19 0615  NA 137 140 138  K 4.0 4.1 3.8  CL 105 110 108  CO2 23 23 23   GLUCOSE 172* 108* 105*  BUN 12 10 11   CREATININE 1.58* 1.25* 1.15  CALCIUM 9.1 9.4 9.0  MG  --  2.0  --    GFR: Estimated Creatinine Clearance: 66.6 mL/min (by C-G formula based on SCr of 1.15 mg/dL). Liver Function Tests: Recent Labs  Lab 12/31/18 0551  AST 30  ALT 28  ALKPHOS 70  BILITOT 0.8  PROT 6.4*  ALBUMIN 3.6   No results for input(s): LIPASE, AMYLASE in the last 168 hours. No results for input(s): AMMONIA in the last 168  hours. Coagulation Profile: Recent Labs  Lab 12/30/18 1713  INR 2.0*   Cardiac Enzymes: No results for input(s): CKTOTAL, CKMB, CKMBINDEX, TROPONINI in the last 168 hours. BNP (last 3 results) No results for input(s): PROBNP in the last 8760 hours. HbA1C: No results for input(s): HGBA1C in the last 72 hours. CBG: No results for input(s): GLUCAP in the last 168 hours. Lipid Profile: No results for input(s): CHOL, HDL, LDLCALC, TRIG, CHOLHDL, LDLDIRECT in the last 72 hours. Thyroid Function Tests: No results for input(s): TSH, T4TOTAL, FREET4, T3FREE, THYROIDAB in the last 72 hours. Anemia Panel: No results for input(s): VITAMINB12, FOLATE, FERRITIN, TIBC, IRON, RETICCTPCT in the last 72 hours. Urine analysis:    Component Value Date/Time   COLORURINE YELLOW 12/30/2018 2015   APPEARANCEUR CLEAR 12/30/2018 2015   APPEARANCEUR Clear 04/10/2016 1035   LABSPEC 1.020 12/30/2018 2015   PHURINE 5.5 12/30/2018 2015   GLUCOSEU NEGATIVE 12/30/2018 2015   HGBUR TRACE (A) 12/30/2018 2015   BILIRUBINUR  NEGATIVE 12/30/2018 2015   BILIRUBINUR Negative 04/10/2016 [place] 12/30/2018 2015   PROTEINUR NEGATIVE 12/30/2018 2015   NITRITE NEGATIVE 12/30/2018 2015   LEUKOCYTESUR NEGATIVE 12/30/2018 2015   Sepsis Labs: @LABRCNTIP (procalcitonin:4,lacticidven:4)  ) Recent Results (from the past 240 hour(s))  SARS CORONAVIRUS 2 (TAT 6-24 HRS) Nasopharyngeal Nasopharyngeal Swab     Status: None   Collection Time: 12/30/18 12:04 AM   Specimen: Nasopharyngeal Swab  Result Value Ref Range Status   SARS Coronavirus 2 NEGATIVE NEGATIVE Final    Comment: (NOTE) SARS-CoV-2 target nucleic acids are NOT DETECTED. The SARS-CoV-2 RNA is generally detectable in upper and lower respiratory specimens during the acute phase of infection. Negative results do not preclude SARS-CoV-2 infection, do not rule out co-infections with other pathogens, and should not be used as the sole basis for  treatment or other patient management decisions. Negative results must be combined with clinical observations, patient history, and epidemiological information. The expected result is Negative. Fact Sheet for Patients: SugarRoll.be Fact Sheet for Healthcare Providers: https://www.woods-mathews.com/ This test is not yet approved or cleared by the [place] FDA and  has been authorized for detection and/or diagnosis of SARS-CoV-2 by FDA under an Emergency Use Authorization (EUA). This EUA will remain  in effect (meaning this test can be used) for the duration of the COVID-19 declaration under Section 56 4(b)(1) of the Act, 21 U.S.C. section 360bbb-3(b)(1), unless the authorization is terminated or revoked sooner. Performed at [facility], [place] [address] [place], [place] [address]       Studies: No results found.  Scheduled Meds: . allopurinol  100 mg Oral QHS  . atorvastatin  80 mg Oral QHS  . carvedilol  12.5 mg Oral BID WC  . ezetimibe  10 mg Oral Daily  . montelukast  10 mg Oral QHS  . rOPINIRole  1 mg Oral QHS  . sodium chloride flush  3 mL Intravenous Once  . tamsulosin  0.4 mg Oral QPC supper    Continuous Infusions:   LOS: 1 day     Kayleen Memos, MD [facility] Pager [phone]  If 7PM-7AM, please contact night-coverage [email or web address] Password [facility] 01/01/2019, 3:34 PM

## 2019-01-01 NOTE — Progress Notes (Signed)
EEG Completed; Results Pending  

## 2019-01-01 NOTE — Progress Notes (Addendum)
Progress Note  Patient Name: Bruce [name] Date of Encounter: 01/01/2019  Primary Cardiologist: No primary care provider on file.   Subjective   Denies any chest pain or SOB  Inpatient Medications    Scheduled Meds: . allopurinol  100 mg Oral QHS  . atorvastatin  80 mg Oral QHS  . carvedilol  12.5 mg Oral BID WC  . ezetimibe  10 mg Oral Daily  . montelukast  10 mg Oral QHS  . rOPINIRole  1 mg Oral QHS  . sodium chloride flush  3 mL Intravenous Once  . tamsulosin  0.4 mg Oral QPC supper   Continuous Infusions:  PRN Meds: acetaminophen **OR** acetaminophen, albuterol, HYDROcodone-acetaminophen, nitroGLYCERIN, ondansetron **OR** ondansetron (ZOFRAN) IV   Vital Signs    Vitals:   12/31/18 1615 12/31/18 1942 12/31/18 2326 01/01/19 0355  BP: (!) 145/78 140/83 118/65 114/60  Pulse: 61 64 69 64  Resp: 19 18 18 18   Temp: 98.1 F (36.7 C) 98.5 F (36.9 C) 98.7 F (37.1 C) 98.3 F (36.8 C)  TempSrc: Oral Oral Oral Oral  SpO2: 99% 93% 99% 100%  Weight:      Height:        Intake/Output Summary (Last 24 hours) at 01/01/2019 0734 Last data filed at 12/31/2018 1942 Gross per 24 hour  Intake -  Output 650 ml  Net -650 ml   Filed Weights   12/30/18 1637  Weight: 80.7 kg    Telemetry    NSR - Personally Reviewed  ECG    NSR with no prolongation of QTc - Personally Reviewed  Physical Exam   GEN: Well nourished, well developed in no acute distress HEENT: marked ecchymosis over right eye and maxillary bone NECK: No JVD; No carotid bruits LYMPHATICS: No lymphadenopathy CARDIAC:RRR, no murmurs, rubs, gallops RESPIRATORY:  Clear to auscultation without rales, wheezing or rhonchi  ABDOMEN: Soft, non-tender, non-distended MUSCULOSKELETAL:  No edema; No deformity  SKIN: Warm and dry NEUROLOGIC:  Alert and oriented x 3 PSYCHIATRIC:  Normal affect    Labs    Chemistry Recent Labs  Lab 12/30/18 1640 12/31/18 0551  NA 137 140  K 4.0 4.1  CL 105 110   CO2 23 23  GLUCOSE 172* 108*  BUN 12 10  CREATININE 1.58* 1.25*  CALCIUM 9.1 9.4  PROT  --  6.4*  ALBUMIN  --  3.6  AST  --  30  ALT  --  28  ALKPHOS  --  70  BILITOT  --  0.8  GFRNONAA 43* 58*  GFRAA 50* >60  ANIONGAP 9 7     Hematology Recent Labs  Lab 12/30/18 1640 12/31/18 0551  WBC 9.9 7.2  RBC 3.93* 4.14*  HGB 13.0 13.5  HCT 38.8* 40.8  MCV 98.7 98.6  MCH 33.1 32.6  MCHC 33.5 33.1  RDW 12.7 12.9  PLT 197 203    Cardiac EnzymesNo results for input(s): TROPONINI in the last 168 hours. No results for input(s): TROPIPOC in the last 168 hours.   BNPNo results for input(s): BNP, PROBNP in the last 168 hours.   DDimer No results for input(s): DDIMER in the last 168 hours.   Radiology    Ct Head Wo Contrast  Result Date: 12/30/2018 CLINICAL DATA:  Syncopal episode with head strike EXAM: CT HEAD WITHOUT CONTRAST CT CERVICAL SPINE WITHOUT CONTRAST TECHNIQUE: Multidetector CT imaging of the head and cervical spine was performed following the standard protocol without intravenous contrast. Multiplanar CT image reconstructions of  the cervical spine were also generated. COMPARISON:  Brain MRI 05/21/2018, CT head 10/12/2009 FINDINGS: CT HEAD FINDINGS Brain: 6 mm focus of hyperattenuation in the left frontal lobe may reflect a small parenchymal contusion. Additional hyperattenuation is seen in the sulci of the anterior frontal poles and in the base of the right precentral sulcus suggesting some subarachnoid blood. No intraventricular hemorrhage. No mass effect or midline shift. No CT evidence of acute infarct. 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 Patchy areas of white matter hypoattenuation are most compatible with chronic microvascular angiopathy. Symmetric prominence of the ventricles, cisterns and sulci compatible with parenchymal volume loss. Vascular: Atherosclerotic calcification of the carotid siphons. No hyperdense vessel. Skull: Right malar soft tissue swelling.  Minimally displaced fractures involving the anterior and lateral walls of the right maxillary sinus. And lateral wall of the right orbit. Question a small cortical step-off for mild diastasis of the right zygomaticomaxillary suture. No calvarial fracture or scalp hematoma. Fragmentation of the nasal bones appears chronic. Sinuses/Orbits: Small volume right maxillary and sphenoid hemosinus. Minimal thickening adjacent the lateral wall right orbital fracture. Globes are intact. Extraocular muscles are normal and symmetric. Other: None CT CERVICAL SPINE FINDINGS Alignment: Cervical stabilization collar is in place. Mild exaggeration of the normal cervical lordosis. No traumatic listhesis. Craniocervical and atlantoaxial alignment is maintained. Skull base and vertebrae: Partial collimation of the anteroinferior corner of C7. No acute fracture. No primary bone lesion or focal pathologic process. Degenerative changes at the atlantoaxial joint including a small corticated os odontoideum. Soft tissues and spinal canal: No pre or paravertebral fluid or swelling. No visible canal hematoma. Disc levels: Multilevel intervertebral disc height loss with spondylitic endplate changes. Disc osteophyte complexes, uncinate spurring and facet degenerative changes result in mild right and moderate left foraminal narrowing at both C5-6 and C6-7. Canal narrowing at these levels is at most mild. Upper chest: Biapical pleuroparenchymal scarring Other: Carotid artery vascular calcification in the. IMPRESSION: 1. 6 mm hyperdensity in the left frontal lobe concerning for parenchymal contusion with additional subarachnoid hyperattenuation along the anterior frontal poles and in the base of the right precentral sulcus. 2. Right malar soft tissue swelling and a fracture pattern involving the anteromedial walls right maxillary sinus, right lateral orbital wall, and possible diastasis of the right zygomaticomaxillary suture suspicious for a ZMC  type fracture pattern. Consider further evaluation with dedicated facial bone CT. 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 4. No acute cervical spine fracture or traumatic listhesis. 5. Multilevel cervical spondylitic changes maximal from C5 through C7, as detailed above. Critical Value/emergent results were called by telephone at the time of interpretation on 12/30/2018 at 9:10 pm to [place] , who verbally acknowledged these results. Electronically Signed   By: Lovena Le M.D.   On: 12/30/2018 21:10   Ct Cervical Spine Wo Contrast  Result Date: 12/30/2018 CLINICAL DATA:  Syncopal episode with head strike EXAM: CT HEAD WITHOUT CONTRAST CT CERVICAL SPINE WITHOUT CONTRAST TECHNIQUE: Multidetector CT imaging of the head and cervical spine was performed following the standard protocol without intravenous contrast. Multiplanar CT image reconstructions of the cervical spine were also generated. COMPARISON:  Brain MRI 05/21/2018, CT head 10/12/2009 FINDINGS: CT HEAD FINDINGS Brain: 6 mm focus of hyperattenuation in the left frontal lobe may reflect a small parenchymal contusion. Additional hyperattenuation is seen in the sulci of the anterior frontal poles and in the base of the  right precentral sulcus suggesting some subarachnoid blood. No intraventricular hemorrhage. No mass effect or midline shift. No CT evidence of acute infarct. 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 Patchy areas of white matter hypoattenuation are most compatible with chronic microvascular angiopathy. Symmetric prominence of the ventricles, cisterns and sulci compatible with parenchymal volume loss. Vascular: Atherosclerotic calcification of the carotid siphons. No hyperdense vessel. Skull: Right malar soft tissue swelling. Minimally displaced fractures involving the anterior and lateral walls of  the right maxillary sinus. And lateral wall of the right orbit. Question a small cortical step-off for mild diastasis of the right zygomaticomaxillary suture. No calvarial fracture or scalp hematoma. Fragmentation of the nasal bones appears chronic. Sinuses/Orbits: Small volume right maxillary and sphenoid hemosinus. Minimal thickening adjacent the lateral wall right orbital fracture. Globes are intact. Extraocular muscles are normal and symmetric. Other: None CT CERVICAL SPINE FINDINGS Alignment: Cervical stabilization collar is in place. Mild exaggeration of the normal cervical lordosis. No traumatic listhesis. Craniocervical and atlantoaxial alignment is maintained. Skull base and vertebrae: Partial collimation of the anteroinferior corner of C7. No acute fracture. No primary bone lesion or focal pathologic process. Degenerative changes at the atlantoaxial joint including a small corticated os odontoideum. Soft tissues and spinal canal: No pre or paravertebral fluid or swelling. No visible canal hematoma. Disc levels: Multilevel intervertebral disc height loss with spondylitic endplate changes. Disc osteophyte complexes, uncinate spurring and facet degenerative changes result in mild right and moderate left foraminal narrowing at both C5-6 and C6-7. Canal narrowing at these levels is at most mild. Upper chest: Biapical pleuroparenchymal scarring Other: Carotid artery vascular calcification in the. IMPRESSION: 1. 6 mm hyperdensity in the left frontal lobe concerning for parenchymal contusion with additional subarachnoid hyperattenuation along the anterior frontal poles and in the base of the right precentral sulcus. 2. Right malar soft tissue swelling and a fracture pattern involving the anteromedial walls right maxillary sinus, right lateral orbital wall, and possible diastasis of the right zygomaticomaxillary suture suspicious for a ZMC type fracture pattern. Consider further evaluation with dedicated facial  bone CT. 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 4. No acute cervical spine fracture or traumatic listhesis. 5. Multilevel cervical spondylitic changes maximal from C5 through C7, as detailed above. Critical Value/emergent results were called by telephone at the time of interpretation on 12/30/2018 at 9:10 pm to [place] , who verbally acknowledged these results. Electronically Signed   By: Lovena Le M.D.   On: 12/30/2018 21:10   Ct Maxillofacial Wo Contrast  Result Date: 12/31/2018 CLINICAL DATA:  73 year old male with facial trauma. EXAM: CT MAXILLOFACIAL WITHOUT CONTRAST TECHNIQUE: Multidetector CT imaging of the maxillofacial structures was performed. Multiplanar CT image reconstructions were also generated. COMPARISON:  Head CT dated 12/30/2018 FINDINGS: Osseous: Minimally displaced fracture of the anterior wall of the right maxillary sinus and minimally angulated fracture of the posterolateral wall of the right maxillary sinus. There is a minimally depressed fracture of the right orbital floor. Minimally displaced fracture or sutural dehiscence of the lateral wall of the right orbit. There is irregularity of the tip of the nasal bone concerning for a minimally displaced fracture. No mandibular dislocation. Orbits: The globes and retro-orbital fat are preserved. Sinuses: Probable blood product in the right maxillary sinus. Mild mucoperiosteal thickening of ethmoid air cells and right sphenoid sinus. The mastoid air cells are clear. Soft tissues: Right forehead and periorbital contusion. Limited  intracranial: No significant or unexpected finding. IMPRESSION: Fractures of the right maxillary sinus and orbital wall as described as well as fracture of the tip of the nasal bone. Electronically Signed   By: Anner Crete M.D.   On: 12/31/2018 00:41    Cardiac  Studies   2D echo 12/31/2018 IMPRESSIONS    1. Left ventricular ejection fraction, by visual estimation, is 40 to 45%. The left ventricle has mild to moderately decreased function. Normal left ventricular size. There is no left ventricular hypertrophy.  2. There is diffuse hypokinesis.  3. Left ventricular diastolic Doppler parameters are consistent with impaired relaxation pattern of LV diastolic filling.  4. Global right ventricle has mildly reduced systolic function.The right ventricular size is normal. No increase in right ventricular wall thickness.  5. Left atrial size was mildly dilated.  6. Right atrial size was normal.  7. The mitral valve is normal in structure. No evidence of mitral valve regurgitation.  8. The tricuspid valve is normal in structure. Tricuspid valve regurgitation was not visualized by color flow Doppler.  9. The aortic valve is tricuspid Aortic valve regurgitation was not visualized by color flow Doppler. Mild aortic valve sclerosis without stenosis. 10. The pulmonic valve was not well visualized. Pulmonic valve regurgitation is not visualized by color flow Doppler. 11. The inferior vena cava is dilated in size with <50% respiratory variability, suggesting right atrial pressure of 15 mmHg. 12. TR signal is inadequate for assessing pulmonary artery systolic pressure.    Patient Profile     73 y.o. male with a hx of CAD, PAF, chronic systolic CHF, HTN, HLD and ongoing tobacco use.  He is followed at [facility] by Dr. Humphrey Rolls for remote MI in 2009 and then had a cath in August showing non obstructive CAD with 35% mLAD lesion and moderate LV dysfunction.  His last echo in Feb 2020 showed mildly reduced LVF with EF 45-50% and mild MR.  Presented with a syncopal episode.   Assessment & Plan    1. Syncope - Unknown etiology - Differential includes arrhythmia or orthostatic hypotension.  - Recent cardiac cath did not show obstructive disease.  2D echo with stable mild LV  dysfunction with EF 40-45% similar to EF of 45% with primary cardiologist recently.  - He reports intermittent fluttering sensation and history of A. fib as summarized above.   -discussed with EP given his prolonged QTc and syncope.  His QTc is normal today after stopping all QT prolonging drugs.  Recommend proceeding 30-day event monitor and if no arrhythmias then implantable loop recorder   - He has appointment with Dr. Humphrey Rolls, his Cardiologist in [place], this Friday.  Will arrange heart monitor with him.   2.  Paroxysmal atrial fibrillation -He takes Xarelto for DVT/PAF - currently on hold due to subarachnoid hematoma -Patient does endorse history of atrial fibrillation for many years.  Intermittent fluttering sensation. -Resume Xarelto as direction per neurosurgery given subarachnoid hematoma  3.  Tobacco and marijuana abuse -Advised cessation  4.  Right-sided frontal contusion, subarachnoid hemorrhage -Per primary team  5.  AKI -Renal function improved with hydration. -creatinine improved today -ARB and spiro on hold      For questions or updates, please contact [place] Please consult [email or web address] for contact info under Cardiology/STEMI.      Signed, Fransico Him, MD  01/01/2019, 7:34 AM

## 2019-01-01 NOTE — Discharge Instructions (Signed)
NO DRIVING  KEEP APPOINTMENT WITH DR Humphrey Rolls.

## 2019-01-02 DIAGNOSIS — I480 Paroxysmal atrial fibrillation: Secondary | ICD-10-CM | POA: Diagnosis not present

## 2019-01-02 DIAGNOSIS — R55 Syncope and collapse: Secondary | ICD-10-CM | POA: Diagnosis not present

## 2019-01-02 DIAGNOSIS — I2510 Atherosclerotic heart disease of native coronary artery without angina pectoris: Secondary | ICD-10-CM | POA: Diagnosis not present

## 2019-01-02 DIAGNOSIS — I10 Essential (primary) hypertension: Secondary | ICD-10-CM | POA: Diagnosis not present

## 2019-01-02 LAB — BASIC METABOLIC PANEL
Anion gap: 7 (ref 5–15)
BUN: 17 mg/dL (ref 8–23)
CO2: 22 mmol/L (ref 22–32)
Calcium: 8.7 mg/dL — ABNORMAL LOW (ref 8.9–10.3)
Chloride: 109 mmol/L (ref 98–111)
Creatinine, Ser: 1.28 mg/dL — ABNORMAL HIGH (ref 0.61–1.24)
GFR calc Af Amer: >60 mL/min (ref >60)
GFR calc non Af Amer: 56 mL/min — ABNORMAL LOW (ref >60)
Glucose, Bld: 110 mg/dL — ABNORMAL HIGH (ref 70–99)
Potassium: 3.7 mmol/L (ref 3.5–5.1)
Sodium: 138 mmol/L (ref 135–145)

## 2019-01-02 MED ORDER — ATORVASTATIN CALCIUM 80 MG PO TABS: 80.0000 mg | ORAL_TABLET | Freq: Every day | ORAL | 0 refills | Status: DC

## 2019-01-02 MED ORDER — HYDROCODONE-ACETAMINOPHEN 5-325 MG PO TABS: 1.0000 | ORAL_TABLET | Freq: Every day | ORAL | 0 refills | Status: DC | PRN

## 2019-01-02 MED ORDER — CARVEDILOL 12.5 MG PO TABS: 12.5000 mg | ORAL_TABLET | Freq: Two times a day (BID) | ORAL | 0 refills | Status: DC

## 2019-01-02 MED ORDER — MONTELUKAST SODIUM 10 MG PO TABS: 10.0000 mg | ORAL_TABLET | Freq: Every day | ORAL | 0 refills | Status: DC

## 2019-01-02 MED ORDER — EZETIMIBE 10 MG PO TABS: 10.0000 mg | ORAL_TABLET | Freq: Every day | ORAL | 0 refills | Status: DC

## 2019-01-02 MED ORDER — RIVAROXABAN 20 MG PO TABS: 20.0000 mg | ORAL_TABLET | Freq: Every day | ORAL | 0 refills | Status: DC

## 2019-01-02 NOTE — Progress Notes (Signed)
Progress Note  Patient Name: Bruce [name] Date of Encounter: 01/02/2019  Primary Cardiologist: [name], MD   Subjective   No CP or SOB. Patient using a walker while working with PT.  Inpatient Medications    Scheduled Meds: . allopurinol  100 mg Oral QHS  . atorvastatin  80 mg Oral QHS  . carvedilol  12.5 mg Oral BID WC  . ezetimibe  10 mg Oral Daily  . montelukast  10 mg Oral QHS  . rOPINIRole  1 mg Oral QHS  . tamsulosin  0.4 mg Oral QPC supper   Continuous Infusions:  PRN Meds: acetaminophen **OR** acetaminophen, albuterol, HYDROcodone-acetaminophen, nitroGLYCERIN, ondansetron **OR** ondansetron (ZOFRAN) IV   Vital Signs    Vitals:   01/01/19 2111 01/02/19 0015 01/02/19 0342 01/02/19 0744  BP: 111/61 123/67 122/73 127/73  Pulse: 68 61 65 60  Resp: 20 18 18 18   Temp: 98.3 F (36.8 C) 98.2 F (36.8 C) 98.6 F (37 C) 98.4 F (36.9 C)  TempSrc: Oral Oral Oral Oral  SpO2: 91% 91% 92% 93%  Weight:      Height:        Intake/Output Summary (Last 24 hours) at 01/02/2019 1009 Last data filed at 01/02/2019 0800 Gross per 24 hour  Intake 480 ml  Output -  Net 480 ml   Last 3 Weights 12/30/2018 10/11/2018 10/10/2018  Weight (lbs) 178 lb 171 lb 171 lb 8 oz  Weight (kg) 80.74 kg 77.565 kg 77.792 kg  Some encounter information is confidential and restricted. Go to Review Flowsheets activity to see all data.      Telemetry    NSR, HR 60s, no other arrhythmias noted- Personally Reviewed  ECG    Sinus bradycardia, 58 bpm, Qtc 424 ms, poor R wave progression - Personally Reviewed  Physical Exam   GEN: No acute distress.   Head: Ecchymosis and healing lacerations on the right side of the face Neck: No JVD Cardiac: RRR, no murmurs, rubs, or gallops.  Respiratory: Clear to auscultation bilaterally. GI: Soft, nontender, non-distended  MS: No edema; No deformity. Neuro:  Nonfocal  Psych: Normal affect   Labs    High Sensitivity Troponin:   Recent  Labs  Lab 12/31/18 1146 12/31/18 1420  TROPONINIHS 8 9      Chemistry Recent Labs  Lab 12/31/18 0551 01/01/19 0615 01/02/19 0351  NA 140 138 138  K 4.1 3.8 3.7  CL 110 108 109  CO2 23 23 22   GLUCOSE 108* 105* 110*  BUN 10 11 17   CREATININE 1.25* 1.15 1.28*  CALCIUM 9.4 9.0 8.7*  PROT 6.4*  --   --   ALBUMIN 3.6  --   --   AST 30  --   --   ALT 28  --   --   ALKPHOS 70  --   --   BILITOT 0.8  --   --   GFRNONAA 58* >60 56*  GFRAA >60 >60 >60  ANIONGAP 7 7 7      Hematology Recent Labs  Lab 12/30/18 1640 12/31/18 0551  WBC 9.9 7.2  RBC 3.93* 4.14*  HGB 13.0 13.5  HCT 38.8* 40.8  MCV 98.7 98.6  MCH 33.1 32.6  MCHC 33.5 33.1  RDW 12.7 12.9  PLT 197 203    BNPNo results for input(s): BNP, PROBNP in the last 168 hours.   DDimer No results for input(s): DDIMER in the last 168 hours.   Radiology    No results  found.  Cardiac Studies   2D echo 12/31/2018 IMPRESSIONS   1. Left ventricular ejection fraction, by visual estimation, is 40 to 45%. The left ventricle has mild to moderately decreased function. Normal left ventricular size. There is no left ventricular hypertrophy. 2. There is diffuse hypokinesis. 3. Left ventricular diastolic Doppler parameters are consistent with impaired relaxation pattern of LV diastolic filling. 4. Global right ventricle has mildly reduced systolic function.The right ventricular size is normal. No increase in right ventricular wall thickness. 5. Left atrial size was mildly dilated. 6. Right atrial size was normal. 7. The mitral valve is normal in structure. No evidence of mitral valve regurgitation. 8. The tricuspid valve is normal in structure. Tricuspid valve regurgitation was not visualized by color flow Doppler. 9. The aortic valve is tricuspid Aortic valve regurgitation was not visualized by color flow Doppler. Mild aortic valve sclerosis without stenosis. 10. The pulmonic valve was not well visualized.  Pulmonic valve regurgitation is not visualized by color flow Doppler. 11. The inferior vena cava is dilated in size with <50% respiratory variability, suggesting right atrial pressure of 15 mmHg. 12. TR signal is inadequate for assessing pulmonary artery systolic pressure.   Patient Profile     73 y.o. male with a hx of CAD, PAF, chronic systolic CHF, HTN, HLD and ongoing tobacco use. He is followed at [facility] by Dr. Humphrey Rolls for remote MI in 2009 and then had a cath in August showing non obstructive CAD with 35% mLAD lesion and moderate LV dysfunction. His last echo in Feb 2020 showed mildly reduced LVF with EF 45-50% and mild MR. Presented with a syncopal episode.   Assessment & Plan    Syncope - Unknown etiology>> arrhythmia vsorthostatic hypotension. - EEG performed to evaluate possible neurologic etiology>> no seizures were seen -Recent cardiac cath did not show obstructive disease.  - 2D echo with stable mild LV dysfunction with EF 40-45% similar to EF of 45% with primary cardiologist recently.  - Orthostatic Vitals appear to be normal - Denies chest pain, SOB, or recent fluttering  - EP was consulted for his prolonged QTc and syncope>> His QTc is normal after stopping all QT prolonging drugs.   - Recommend proceeding 30-day event monitor and if no arrhythmias then implantable loop recorder  - He has appointment with Dr. Humphrey Rolls, his Cardiologist in [place],this Friday.  Plan to arrange heart monitor with him.   Paroxysmal atrial fibrillation -He takes Xarelto for DVT/PAF - currently on hold due to subarachnoid hematoma -Patient does endorse history of atrial fibrillation for many years. Intermittent fluttering sensation. -Resume Xarelto as direction per neurosurgery given subarachnoidhematoma - Patient has been maintaining NSR this admission  Tobacco and marijuana abuse -Advised cessation  Right-sided frontal contusion, subarachnoid hemorrhage -Per primary  team  AKI -Renal function improved with hydration. -creatinine 1.15 > 1.28 -ARB and spiro on hold >> would continued to hold at this time and possibly start at outpatient setting   For questions or updates, please contact [facility] Please consult [email or web address] for contact info under        Signed, Khloie Hamada Ninfa Meeker, PA-C  01/02/2019, 10:09 AM

## 2019-01-02 NOTE — Progress Notes (Signed)
Physical Therapy Treatment Patient Details Name: Bruce [name] MRN: [id number] DOB: [birth year] Today's Date: 01/02/2019    History of Present Illness 73 yo male with onset of syncope and fall from no known cause was admitted to ED.  Has a facial fracture of R maxillary, orbital and nasal bones.  Has subarachnoid hemorrhage, R frontal contusion and will have neuro follow.  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   Had marijuana in system.  PMHx:  angina, MI, PVD, COPD, colon CA, anxiety, CHF, 2016 R tibial fracture, UTI, transfusion    PT Comments    Patient received up in recliner, reports he should be going home today. Agrees to PT reports he is "tired of sitting here". Patient educated to take his time when transitioning from sit to stand to ensure he is not dizzy.Performed transfer with supervision, ambulated without AD, but patient reaching for furniture and rails, therefore gave hand held assist. Patient continues to be slightly unsteady with ambulation and unsure of himself when walking. He will continue to benefit from skilled PT while here to improve mobility and safety.        Follow Up Recommendations  Outpatient PT(he will most likely refuse)     Equipment Recommendations  None recommended by PT    Recommendations for Other Services       Precautions / Restrictions Precautions Precautions: Fall Precaution Comments: unsteady with no AD Restrictions Weight Bearing Restrictions: No    Mobility  Bed Mobility               General bed mobility comments: Patient in recliner and retuned to recliner  Transfers Overall transfer level: Modified independent Equipment used: None             General transfer comment: perfromed transfer without assist  Ambulation/Gait Ambulation/Gait assistance: Min assist;Min guard Gait Distance (Feet): 200 Feet Assistive device: 1 person hand held assist Gait Pattern/deviations: Step-through pattern;Decreased stride length Gait velocity:  decreased   General Gait Details: Patient slightly unsteady without AD, reaching for rails, furniture, then provided HHA. One unsteady moment during ambulation.   Stairs             Wheelchair Mobility    Modified Rankin (Stroke Patients Only)       Balance Overall balance assessment: History of Falls                                          Cognition Arousal/Alertness: Awake/alert Behavior During Therapy: WFL for tasks assessed/performed Overall Cognitive Status: Within Functional Limits for tasks assessed                                        Exercises      General Comments        Pertinent Vitals/Pain Pain Assessment: Faces Faces Pain Scale: Hurts little more Pain Location: shoulder, R hip Pain Descriptors / Indicators: Sore Pain Intervention(s): Monitored during session    Home Living Family/patient expects to be discharged to:: Private residence Living Arrangements: Alone Available Help at Discharge: Family;Friend(s);Available PRN/intermittently Type of Home: House Home Access: Stairs to enter Entrance Stairs-Rails: Right;Left Home Layout: Multi-level Home Equipment: Environmental consultant - 2 wheels;Cane - single point      Prior Function Level of Independence: Independent with assistive device(s)  Comments: uses SPC for pain management   PT Goals (current goals can now be found in the care plan section) Acute Rehab PT Goals Patient Stated Goal: to return home PT Goal Formulation: With patient Time For Goal Achievement: 01/14/19 Potential to Achieve Goals: Good Progress towards PT goals: Progressing toward goals    Frequency    Min 3X/week      PT Plan Current plan remains appropriate    Co-evaluation              AM-PAC PT "6 Clicks" Mobility   Outcome Measure  Help needed turning from your back to your side while in a flat bed without using bedrails?: None Help needed moving from lying on your  back to sitting on the side of a flat bed without using bedrails?: None Help needed moving to and from a bed to a chair (including a wheelchair)?: A Little Help needed standing up from a chair using your arms (e.g., wheelchair or bedside chair)?: A Little Help needed to walk in hospital room?: A Little Help needed climbing 3-5 steps with a railing? : A Little 6 Click Score: 20    End of Session Equipment Utilized During Treatment: Gait belt Activity Tolerance: Patient tolerated treatment well Patient left: in chair;with call bell/phone within reach Nurse Communication: Mobility status PT Visit Diagnosis: Unsteadiness on feet (R26.81);Pain Pain - Right/Left: Right Pain - part of body: Shoulder;Hip     Time: 1030-1050 PT Time Calculation (min) (ACUTE ONLY): 20 min  Charges:  $Gait Training: 8-22 mins                     Brixton Schnapp, PT, GCS 01/02/19,10:58 AM

## 2019-01-02 NOTE — Discharge Summary (Signed)
Discharge Summary  Bruce [name] [id number] DOB: 01-24-1946  PCP: Danelle Berry, NP  Admit date: 12/30/2018 Discharge date: 01/02/2019  Time spent: 35 minutes  Recommendations for Outpatient Follow-up:  1. Follow-up with neurosurgery 2. Follow-up with your cardiologist 3. Obtain Holter monitor from cardiology 4. Take your medications as prescribed 5. Continue physical therapy 6. Fall precautions.  Discharge Diagnoses:  Active Hospital Problems   Diagnosis Date Noted  . Syncope 12/31/2018  . PAF (paroxysmal atrial fibrillation) ([facility])   . Intracranial bleed ([place]) 12/31/2018  . Malignant neoplasm of prostate ([place]) 07/13/2016  . Hypertension   . CAD (coronary artery disease) 08/16/2010    Resolved Hospital Problems  No resolved problems to display.    Discharge Condition: Stable  Diet recommendation: Resume previous diet, heart healthy diet.  Vitals:   01/02/19 0744 01/02/19 1137  BP: 127/73 100/69  Pulse: 60 (!) 58  Resp: 18 18  Temp: 98.4 F (36.9 C) 98.4 F (36.9 C)  SpO2: 93% 93%    History of present illness:  Bruce L Hunteris a 73 y.o.malewithhistory of chronic systolic heart failure, COPD, hypertension, admission for non-ST elevation MI in August 2020 at [facility] during which cardiac cath did not show any significant coronary artery obstruction had a syncopal episode last evening while walking to the parking lot. Patient states he did not have any prodromal symptoms did not have any chest pain palpitation nausea vomiting or diarrhea. He said he lost consciousness and hit his face. Bystanders had to call EMS by then he regained his consciousness. Patient did not have any incontinence of urine or any tongue bite.  ED Course:In the ER monitor was showing sinus rhythm. CT scan of the C-spine head and maxillofacial was done. Shows right maxillary orbital and nasal bone fracture. CT head was showing left frontal contusion  with subarachnoid hemorrhage. On-call trauma surgeon and neurosurgeon Dr. Christella Noa were consulted. Urine drug screen is positive for marijuana. Labs show creatinine 1.5 increased from baseline hemoglobin 13 platelets 197 INR 2. COVID-19 is pending. Patient admitted for syncope with multiple fractures involving the facial area with contusion of the brain with possible hemorrhage.  01/01/19: Patient was seen and examined at his bedside this morning.  He reports last fall prior to the one that led to this admission, was 3 months prior.  This time, experienced dizziness briefly prior to falling.  Patient has a history of chronic CVA noted on MRI done on 05/21/2018 affecting the right cerebellum and chronic ischemic changes in pons and white matter.  Curb sided and discussed with neurology Dr. Leonel Ramsay, this syncopal event is less likely neurological.  Will obtain an EEG as part of syncope work-up.   EEG done on 01/01/2019:  ABNORMALITY - Intermittent slow, left hemisphere  IMPRESSION: This study is suggestive of cortical dysfunction in left hemisphere, could be secondary to underlying contusion. No seizures or epileptiform discharges were seen throughout the recording.  Bruce [name]   01/02/19: Patient was seen and examined at his bedside this morning.  No acute events overnight.  He has no new complaints.  Seen by cardiology, okay to discharge home with the following medications: Medication Recommendations per cardiology on 01/02/19:  Atorvastatin 80mg  daily, Carvedilol 12.5mg  BID, Zetia 10mg  daily.  Restart Xarelto when ok with Neurosurgery Other recommendations (labs, testing, etc):  needs to avoid QT prolonging drugs.  We have stopped Celexa, Ranexa and Remeron Follow up as an outpatient:  Followup with Cardiologist in [place] tomorrow.  Discussed with Dr. Christella Noa, Neurosurgery, via phone on 01/02/19. OK to restart Xarelto one week after fall injury (12/30/18) on 01/06/19.     Hospital Course:  Principal Problem:   Syncope Active Problems:   CAD (coronary artery disease)   Hypertension   Malignant neoplasm of prostate (H[facility])   Intracranial bleed (H[facility])   PAF (paroxysmal atrial fibrillation) (H[facility])  Syncope with facial and head trauma Suspect cardiac etiology versus orthostatic hypotension TTE done on 12/31/2018 showed decreased LVEF 40 to 45% and diffuse hypokinesis Seen by cardiology Last 2D echo done in February 2020 showed EF of 45 to 50% with cardiac catheter in August 2020 showed normal coronaries with EF of 45%. EEG 01/01/19 as part of syncope work-up, results as stated above. Positive orthostatic vital signs on 12/31/2018.  Continue to hold off Lasix. Cardiology will arrange for 30-day event Holter monitor.  If no arrhythmia is noted possible loop recorder placement per cardiology.  Prolonged QTC All home QTC prolonging agents held by cardiology Repeat EKG done on 01/02/19 QTC improved 424 from QTC 513 on 12/31/18, after holding antidepressants.  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  Paroxysmal A. fib on Xarelto Rate controlled Resume Xarelto on 01/06/2019  Fractures of the right maxillary sinus and orbital wall/fracture of the tip of the nasal bone. Seen by ENT.  Appreciate expertise. Follow-up with ENT. Fracture precautions per ENT: 1. Elevate head of bed 2. Ice compress to periorbital region 3. Avoid additional trauma, nose blowing or sneezing 4. Liquid and soft diet as tolerated 5. Saline nasal spray 4 times a day and when necessary  AKI likely prerenal in the setting of poor oral intake. Baseline creatinine appears to be 0.9 with GFR greater than 60 Presented with creatinine of 1.5 and GFR 43 Creatinine 1.28 with  GFR 56 on 01/02/2019. Continue to avoid nephrotoxins such as NSAIDs Follow-up with your PCP  Essential hypertension Continue to hold ARB and Lasix  Currently on Coreg 12.5 mg twice daily as recommended by cardiology  Right-sided frontal contusion and subarachnoid hemorrhage for which neurosurgery Dr. Christella Noa was curbsided.  No planned procedures. Follow-up with neurosurgery outpatient.  Recent admission in August 2020 for non-ST elevation MI.  Continue home medications.  Chronic anxiety/depression Medications held due to prolonged QTC Follow-up with your outpatient provider  COPD not actively wheezing.  Continue home medications.     Code Status:Full code.  Consults called: ENT, cardiology, curbside neurosurgery.   Discharge Exam: BP 100/69 (BP Location: Left Arm)   Pulse (!) 58   Temp 98.4 F (36.9 C) (Oral)   Resp 18   Ht 6\' 1"  (1.854 m)   Wt 80.7 kg   SpO2 93%   BMI 23.48 kg/m  . General: 73 y.o. year-old male well developed well nourished in no acute distress.  Alert and oriented x4. . Cardiovascular: Regular rate and rhythm with no rubs or gallops.  No thyromegaly or JVD noted.   Marland Kitchen Respiratory: Clear to auscultation with no wheezes or rales. Good inspiratory effort. . Abdomen: Soft nontender nondistended with normal bowel sounds x4 quadrants. . Musculoskeletal: No lower extremity edema. 2/4 pulses in all 4 extremities. . Skin: R facial brusing . Psychiatry: Mood is appropriate for condition and setting  Discharge Instructions You were cared for by  a hospitalist during your hospital stay. If you have any questions about your discharge medications or the care you received while you were in the hospital after you are discharged, you can call the unit and asked to speak with the hospitalist on call if the hospitalist that took care of you is not available. Once you are discharged, your primary care physician will handle any further medical issues. Please note  that NO REFILLS for any discharge medications will be authorized once you are discharged, as it is imperative that you return to your primary care physician (or establish a relationship with a primary care physician if you do not have one) for your aftercare needs so that they can reassess your need for medications and monitor your lab values.   Allergies as of 01/02/2019      Reactions   Adhesive [tape] Other (See Comments)   After right leg fracture surgery, pt developed a large blister where tape was applied to his right leg. OK to use paper tape.   Imdur [isosorbide Dinitrate] Other (See Comments)   hallucinations   Singulair [montelukast Sodium] Other (See Comments)   Hallucinations      Medication List    STOP taking these medications   citalopram 40 MG tablet Commonly known as: CELEXA   furosemide 20 MG tablet Commonly known as: LASIX   hydrOXYzine 10 MG tablet Commonly known as: ATARAX/VISTARIL   losartan 50 MG tablet Commonly known as: COZAAR   mirtazapine 15 MG tablet Commonly known as: REMERON   Ranexa 1000 MG SR tablet Generic drug: ranolazine   spironolactone 25 MG tablet Commonly known as: ALDACTONE   traZODone 150 MG tablet Commonly known as: DESYREL     TAKE these medications   acetaminophen 325 MG tablet Commonly known as: TYLENOL Take 2 tablets (650 mg total) by mouth every 6 (six) hours as needed for mild pain or headache.   albuterol 108 (90 Base) MCG/ACT inhaler Commonly known as: VENTOLIN HFA Inhale 2 puffs into the lungs every 6 (six) hours as needed for wheezing or shortness of breath.   atorvastatin 80 MG tablet Commonly known as: LIPITOR Take 1 tablet (80 mg total) by mouth at bedtime. Notes to patient: 01/02/2019   carvedilol 12.5 MG tablet Commonly known as: COREG Take 1 tablet (12.5 mg total) by mouth 2 (two) times daily. Notes to patient: 01/02/2019   cholecalciferol 25 MCG (1000 UT) tablet Commonly known as: VITAMIN D Take  1,000 Units by mouth daily. Notes to patient: 01/03/2019   ezetimibe 10 MG tablet Commonly known as: ZETIA Take 1 tablet (10 mg total) by mouth daily. Start taking on: January 03, 2019 What changed: when to take this   FISH OIL PO Take 1,000 mg by mouth every evening. Notes to patient: 01/02/2019   HYDROcodone-acetaminophen 5-325 MG tablet Commonly known as: NORCO/VICODIN Take 1 tablet by mouth daily as needed for moderate pain or severe pain.   loperamide 2 MG capsule Commonly known as: IMODIUM Take 2 mg by mouth as needed for diarrhea or loose stools.   montelukast 10 MG tablet Commonly known as: SINGULAIR Take 1 tablet (10 mg total) by mouth at bedtime. Notes to patient: 01/02/2019   multivitamin tablet Take 1 tablet by mouth daily. For Men Notes to patient: 01/03/2019   nitroGLYCERIN 0.4 MG SL tablet Commonly known as: NITROSTAT Place 0.4 mg under the tongue every 5 (five) minutes as needed for chest pain.   omeprazole 40 MG capsule Commonly known as:  PRILOSEC Take 40 mg by mouth 2 (two) times daily. Notes to patient: 0000000   Repatha SureClick [id number] MG/ML Soaj Generic drug: Evolocumab Inject 1 Syringe into the skin every 14 (fourteen) days. Notes to patient: Continue home schedule   rivaroxaban 20 MG Tabs tablet Commonly known as: XARELTO Take 1 tablet (20 mg total) by mouth daily. Start taking on: January 06, 2019 What changed: These instructions start on January 06, 2019. If you are unsure what to do until then, ask your doctor or other care provider.   rOPINIRole 1 MG tablet Commonly known as: REQUIP Take 1 mg by mouth at bedtime. Notes to patient: 01/02/2019   tamsulosin 0.4 MG Caps capsule Commonly known as: FLOMAX TAKE ONE CAPSULE BY MOUTH ONCE DAILY AFTER SUPPER What changed: See the new instructions. Notes to patient: 01/02/2019      Allergies  Allergen Reactions  . Adhesive [Tape] Other (See Comments)    After right leg fracture  surgery, pt developed a large blister where tape was applied to his right leg. OK to use paper tape.  . Imdur [Isosorbide Dinitrate] Other (See Comments)    hallucinations  . Singulair [Montelukast Sodium] Other (See Comments)    Hallucinations    Follow-up Information    Danelle Berry, NP. Call in 1 day(s).   Specialty: Nurse Practitioner Why: Please call for a post hospital follow-up appointment. Contact information: [place] [address] [phone]        Ashok Pall, MD. Call in 1 day(s).   Specialty: Neurosurgery Why: Please call for a post hospital follow-up appointment. Contact information: [address] [place] [address] [phone]        Jerrell Belfast, MD. Call in 1 day(s).   Specialty: Otolaryngology Why: Please call for a post hospital follow-up appointment. Contact information: [address] [place] [address] [phone]        Sueanne Margarita, MD. Call in 1 day(s).   Specialty: Cardiology Why: Please call for a post hospital follow-up appointment. Contact information: [id number] [place] [address] [phone]        Care, [facility] Follow up.   Specialty: Home Health Services Why: The home health agency will contact you for the first home visit. Contact information: [name] [address] [phone]            The results of significant diagnostics from this hospitalization (including imaging, microbiology, ancillary and laboratory) are listed below for reference.    Significant Diagnostic Studies: Ct Head Wo Contrast  Result Date: 12/30/2018 CLINICAL DATA:  Syncopal episode with head strike EXAM: CT HEAD WITHOUT CONTRAST CT CERVICAL SPINE WITHOUT CONTRAST TECHNIQUE: Multidetector CT imaging of the head and cervical spine was performed following the standard protocol without intravenous contrast. Multiplanar  CT image reconstructions of the cervical spine were also generated. COMPARISON:  Brain MRI 05/21/2018, CT head 10/12/2009 FINDINGS: CT HEAD FINDINGS Brain: 6 mm focus of hyperattenuation in the left frontal lobe may reflect a small parenchymal contusion. Additional hyperattenuation is seen in the sulci of the anterior frontal poles and in the base of the right precentral sulcus suggesting some subarachnoid blood. No intraventricular hemorrhage. No mass effect or midline shift. No CT evidence of acute infarct. 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 Patchy areas of white matter hypoattenuation are most compatible with chronic microvascular angiopathy. Symmetric prominence of the ventricles, cisterns and sulci compatible with  parenchymal volume loss. Vascular: Atherosclerotic calcification of the carotid siphons. No hyperdense vessel. Skull: Right malar soft tissue swelling. Minimally displaced fractures involving the anterior and lateral walls of the right maxillary sinus. And lateral wall of the right orbit. Question a small cortical step-off for mild diastasis of the right zygomaticomaxillary suture. No calvarial fracture or scalp hematoma. Fragmentation of the nasal bones appears chronic. Sinuses/Orbits: Small volume right maxillary and sphenoid hemosinus. Minimal thickening adjacent the lateral wall right orbital fracture. Globes are intact. Extraocular muscles are normal and symmetric. Other: None CT CERVICAL SPINE FINDINGS Alignment: Cervical stabilization collar is in place. Mild exaggeration of the normal cervical lordosis. No traumatic listhesis. Craniocervical and atlantoaxial alignment is maintained. Skull base and vertebrae: Partial collimation of the anteroinferior corner of C7. No acute fracture. No primary bone lesion or focal pathologic process. Degenerative changes at the atlantoaxial joint including a small corticated os odontoideum. Soft tissues and spinal canal: No pre or paravertebral fluid or  swelling. No visible canal hematoma. Disc levels: Multilevel intervertebral disc height loss with spondylitic endplate changes. Disc osteophyte complexes, uncinate spurring and facet degenerative changes result in mild right and moderate left foraminal narrowing at both C5-6 and C6-7. Canal narrowing at these levels is at most mild. Upper chest: Biapical pleuroparenchymal scarring Other: Carotid artery vascular calcification in the. IMPRESSION: 1. 6 mm hyperdensity in the left frontal lobe concerning for parenchymal contusion with additional subarachnoid hyperattenuation along the anterior frontal poles and in the base of the right precentral sulcus. 2. Right malar soft tissue swelling and a fracture pattern involving the anteromedial walls right maxillary sinus, right lateral orbital wall, and possible diastasis of the right zygomaticomaxillary suture suspicious for a ZMC type fracture pattern. Consider further evaluation with dedicated facial bone CT. 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 4. No acute cervical spine fracture or traumatic listhesis. 5. Multilevel cervical spondylitic changes maximal from C5 through C7, as detailed above. Critical Value/emergent results were called by telephone at the time of interpretation on 12/30/2018 at 9:10 pm to [place] , who verbally acknowledged these results. Electronically Signed   By: Lovena Le M.D.   On: 12/30/2018 21:10   Ct Cervical Spine Wo Contrast  Result Date: 12/30/2018 CLINICAL DATA:  Syncopal episode with head strike EXAM: CT HEAD WITHOUT CONTRAST CT CERVICAL SPINE WITHOUT CONTRAST TECHNIQUE: Multidetector CT imaging of the head and cervical spine was performed following the standard protocol without intravenous contrast. Multiplanar CT image reconstructions of the cervical spine were also generated.  COMPARISON:  Brain MRI 05/21/2018, CT head 10/12/2009 FINDINGS: CT HEAD FINDINGS Brain: 6 mm focus of hyperattenuation in the left frontal lobe may reflect a small parenchymal contusion. Additional hyperattenuation is seen in the sulci of the anterior frontal poles and in the base of the right precentral sulcus suggesting some subarachnoid blood. No intraventricular hemorrhage. No mass effect or midline shift. No CT evidence of acute infarct. 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 Patchy areas of white matter hypoattenuation are most compatible with chronic microvascular angiopathy. Symmetric prominence of the ventricles, cisterns and sulci compatible with parenchymal volume loss. Vascular: Atherosclerotic calcification of the carotid siphons. No hyperdense vessel. Skull: Right malar soft tissue swelling. Minimally displaced fractures involving the anterior and lateral walls of the right maxillary sinus. And lateral wall of the right orbit. Question a small cortical step-off for mild diastasis of the right zygomaticomaxillary suture.  No calvarial fracture or scalp hematoma. Fragmentation of the nasal bones appears chronic. Sinuses/Orbits: Small volume right maxillary and sphenoid hemosinus. Minimal thickening adjacent the lateral wall right orbital fracture. Globes are intact. Extraocular muscles are normal and symmetric. Other: None CT CERVICAL SPINE FINDINGS Alignment: Cervical stabilization collar is in place. Mild exaggeration of the normal cervical lordosis. No traumatic listhesis. Craniocervical and atlantoaxial alignment is maintained. Skull base and vertebrae: Partial collimation of the anteroinferior corner of C7. No acute fracture. No primary bone lesion or focal pathologic process. Degenerative changes at the atlantoaxial joint including a small corticated os odontoideum. Soft tissues and spinal canal: No pre or paravertebral fluid or swelling. No visible canal hematoma. Disc levels: Multilevel  intervertebral disc height loss with spondylitic endplate changes. Disc osteophyte complexes, uncinate spurring and facet degenerative changes result in mild right and moderate left foraminal narrowing at both C5-6 and C6-7. Canal narrowing at these levels is at most mild. Upper chest: Biapical pleuroparenchymal scarring Other: Carotid artery vascular calcification in the. IMPRESSION: 1. 6 mm hyperdensity in the left frontal lobe concerning for parenchymal contusion with additional subarachnoid hyperattenuation along the anterior frontal poles and in the base of the right precentral sulcus. 2. Right malar soft tissue swelling and a fracture pattern involving the anteromedial walls right maxillary sinus, right lateral orbital wall, and possible diastasis of the right zygomaticomaxillary suture suspicious for a ZMC type fracture pattern. Consider further evaluation with dedicated facial bone CT. 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 4. No acute cervical spine fracture or traumatic listhesis. 5. Multilevel cervical spondylitic changes maximal from C5 through C7, as detailed above. Critical Value/emergent results were called by telephone at the time of interpretation on 12/30/2018 at 9:10 pm to [place] , who verbally acknowledged these results. Electronically Signed   By: Lovena Le M.D.   On: 12/30/2018 21:10   Ct Maxillofacial Wo Contrast  Result Date: 12/31/2018 CLINICAL DATA:  73 year old male with facial trauma. EXAM: CT MAXILLOFACIAL WITHOUT CONTRAST TECHNIQUE: Multidetector CT imaging of the maxillofacial structures was performed. Multiplanar CT image reconstructions were also generated. COMPARISON:  Head CT dated 12/30/2018 FINDINGS: Osseous: Minimally displaced fracture of the anterior wall of the right maxillary sinus and minimally angulated fracture of  the posterolateral wall of the right maxillary sinus. There is a minimally depressed fracture of the right orbital floor. Minimally displaced fracture or sutural dehiscence of the lateral wall of the right orbit. There is irregularity of the tip of the nasal bone concerning for a minimally displaced fracture. No mandibular dislocation. Orbits: The globes and retro-orbital fat are preserved. Sinuses: Probable blood product in the right maxillary sinus. Mild mucoperiosteal thickening of ethmoid air cells and right sphenoid sinus. The mastoid air cells are clear. Soft tissues: Right forehead and periorbital contusion. Limited intracranial: No significant or unexpected finding. IMPRESSION: Fractures of the right maxillary sinus and orbital wall as described as well as fracture of the tip of the nasal bone. Electronically Signed   By: Anner Crete M.D.   On: 12/31/2018 00:41    Microbiology: Recent Results (from the past 240 hour(s))  SARS CORONAVIRUS 2 (TAT 6-24 HRS) Nasopharyngeal Nasopharyngeal Swab     Status: None   Collection Time: 12/30/18 12:04 AM   Specimen: Nasopharyngeal Swab  Result Value Ref Range Status   SARS Coronavirus 2 NEGATIVE NEGATIVE Final    Comment: (NOTE) SARS-CoV-2 target nucleic  acids are NOT DETECTED. The SARS-CoV-2 RNA is generally detectable in upper and lower respiratory specimens during the acute phase of infection. Negative results do not preclude SARS-CoV-2 infection, do not rule out co-infections with other pathogens, and should not be used as the sole basis for treatment or other patient management decisions. Negative results must be combined with clinical observations, patient history, and epidemiological information. The expected result is Negative. Fact Sheet for Patients: SugarRoll.be Fact Sheet for Healthcare Providers: https://www.woods-mathews.com/ This test is not yet approved or cleared by the [place]  FDA and  has been authorized for detection and/or diagnosis of SARS-CoV-2 by FDA under an Emergency Use Authorization (EUA). This EUA will remain  in effect (meaning this test can be used) for the duration of the COVID-19 declaration under Section 56 4(b)(1) of the Act, 21 U.S.C. section 360bbb-3(b)(1), unless the authorization is terminated or revoked sooner. Performed at [facility], [place] [address], [place], [place] [address]      Labs: Basic Metabolic Panel: Recent Labs  Lab 12/30/18 1640 12/31/18 0551 01/01/19 0615 01/02/19 0351  NA 137 140 138 138  K 4.0 4.1 3.8 3.7  CL 105 110 108 109  CO2 23 23 23 22   GLUCOSE 172* 108* 105* 110*  BUN 12 10 11 17   CREATININE 1.58* 1.25* 1.15 1.28*  CALCIUM 9.1 9.4 9.0 8.7*  MG  --  2.0  --   --    Liver Function Tests: Recent Labs  Lab 12/31/18 0551  AST 30  ALT 28  ALKPHOS 70  BILITOT 0.8  PROT 6.4*  ALBUMIN 3.6   No results for input(s): LIPASE, AMYLASE in the last 168 hours. No results for input(s): AMMONIA in the last 168 hours. CBC: Recent Labs  Lab 12/30/18 1640 12/31/18 0551  WBC 9.9 7.2  NEUTROABS  --  5.5  HGB 13.0 13.5  HCT 38.8* 40.8  MCV 98.7 98.6  PLT 197 203   Cardiac Enzymes: No results for input(s): CKTOTAL, CKMB, CKMBINDEX, TROPONINI in the last 168 hours. BNP: BNP (last 3 results) No results for input(s): BNP in the last 8760 hours.  ProBNP (last 3 results) No results for input(s): PROBNP in the last 8760 hours.  CBG: No results for input(s): GLUCAP in the last 168 hours.     Signed:  Kayleen Memos, MD [facility] 01/02/2019, 12:13 PM

## 2019-01-02 NOTE — Progress Notes (Signed)
UPDATE: Discussed with Dr. Christella Noa, Neurosurgery, via phone on 01/02/19. OK to restart Xarelto one week after fall injury (12/30/18) on 01/06/19.

## 2019-01-02 NOTE — TOC Transition Note (Signed)
Transition of Care [facility]) - CM/SW Discharge Note   Patient Details  Name: Bruce [name] MRN: [id number] Date of Birth: 1945-08-14  [facility] CM/SW Contact:  Pollie Friar, RN Phone Number: 01/02/2019, 12:12 PM   Clinical Narrative:    Pt discharging home with [facility] services. Outpatient recommended for therapy but patient will not have the needed transportation.  Alvis Lemmings selected for [facility] and Tommi Rumps with Alvis Lemmings accepted the referral. Pts son to provide transportation home.   Final next level of care: Home w Home Health Services Barriers to Discharge: No Barriers Identified   Patient Goals and CMS Choice   CMS Medicare.gov Compare Post Acute Care list provided to:: Patient Choice offered to / list presented to : Patient  Discharge Placement                       Discharge Plan and Services                          HH Arranged: PT [facility] Agency: [place] Date [place]: 01/02/19   Representative spoke with at [place]: [place] ([place]) Interventions     Readmission Risk Interventions No flowsheet data found.

## 2019-01-02 NOTE — Evaluation (Signed)
Occupational Therapy Evaluation Patient Details Name: Bruce [name] MRN: [id number] DOB: [birth year] Today's Date: 01/02/2019    History of Present Illness 73 yo male with onset of syncope and fall from no known cause was admitted to ED.  Has a facial fracture of R maxillary, orbital and nasal bones.  Has subarachnoid hemorrhage, R frontal contusion and will have neuro follow.  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   Had marijuana in system.  PMHx:  angina, MI, PVD, COPD, colon CA, anxiety, CHF, 2016 R tibial fracture, UTI, transfusion   Clinical Impression   Pt PTA: Living alone with family living close by. Pt currently limited by fair actiivty tolerance and use of RW for stability. No physical assist required. Hip hike technique for LB ADL. Pt supervisionA to modified independence in room for mobility. Pt seated rest break when PA talking with pt. OTR education on energy conservation techniques for ADL and mobility. Pt plans to have family in his home for a few days after D/C. Pt reporting no dizziness. Pt does not require continued OT skilled services. OT signing off.    Follow Up Recommendations  No OT follow up;Supervision - Intermittent    Equipment Recommendations  None recommended by OT    Recommendations for Other Services       Precautions / Restrictions Precautions Precautions: Fall Precaution Comments: unsteady with no AD Restrictions Weight Bearing Restrictions: No      Mobility Bed Mobility Overal bed mobility: Modified Independent             General bed mobility comments: Patient in recliner and retuned to recliner  Transfers Overall transfer level: Modified independent Equipment used: None             General transfer comment: perfromed transfer without assist    Balance Overall balance assessment: History of Falls                                         ADL either performed or assessed with clinical judgement   ADL Overall ADL's : At  baseline;Modified independent                                       General ADL Comments: Pt limited by fair actiivty tolerance and use of RW for stability. No physical assist required. Hip hike technique for LB ADL.     Vision Baseline Vision/History: No visual deficits Patient Visual Report: No change from baseline Vision Assessment?: No apparent visual deficits     Perception     Praxis      Pertinent Vitals/Pain Pain Assessment: Faces Faces Pain Scale: Hurts little more Pain Location: shoulder, R hip Pain Descriptors / Indicators: Sore Pain Intervention(s): Monitored during session     Hand Dominance Right   Extremity/Trunk Assessment Upper Extremity Assessment Upper Extremity Assessment: Overall WFL for tasks assessed;RUE deficits/detail RUE Deficits / Details: shoulder AROM, WFLs, pain reported with flexion >120*   Lower Extremity Assessment Lower Extremity Assessment: Generalized weakness;Defer to PT evaluation   Cervical / Trunk Assessment Cervical / Trunk Assessment: Normal   Communication Communication Communication: No difficulties   Cognition Arousal/Alertness: Awake/alert Behavior During Therapy: WFL for tasks assessed/performed Overall Cognitive Status: Within Functional Limits for tasks assessed  General Comments       Exercises     Shoulder Instructions      Home Living Family/patient expects to be discharged to:: Private residence Living Arrangements: Alone Available Help at Discharge: Family;Friend(s);Available PRN/intermittently Type of Home: House Home Access: Stairs to enter [employer] of Steps: 3 Entrance Stairs-Rails: Right;Left Home Layout: Multi-level Alternate Level Stairs-Number of Steps: 3   Bathroom Shower/Tub: Teacher, early years/pre: Standard     Home Equipment: Environmental consultant - 2 wheels;Cane - single point          Prior  Functioning/Environment Level of Independence: Independent with assistive device(s)        Comments: uses SPC for pain management        OT Problem List: Decreased activity tolerance      OT Treatment/Interventions:      OT Goals(Current goals can be found in the care plan section) Acute Rehab OT Goals Patient Stated Goal: to return home OT Goal Formulation: With patient Time For Goal Achievement: 01/15/19 Potential to Achieve Goals: Good  OT Frequency:     Barriers to D/C:            Co-evaluation              AM-PAC OT "6 Clicks" Daily Activity     Outcome Measure Help from another person eating meals?: None Help from another person taking care of personal grooming?: None Help from another person toileting, which includes using toliet, bedpan, or urinal?: None Help from another person bathing (including washing, rinsing, drying)?: A Little Help from another person to put on and taking off regular upper body clothing?: None Help from another person to put on and taking off regular lower body clothing?: A Little 6 Click Score: 22   End of Session Equipment Utilized During Treatment: Gait belt;Rolling walker Nurse Communication: Mobility status  Activity Tolerance: Patient tolerated treatment well Patient left: in chair;with call bell/phone within reach  OT Visit Diagnosis: Unsteadiness on feet (R26.81);Muscle weakness (generalized) (M62.81)                Time: [id number] OT Time Calculation (min): 28 min Charges:  OT General Charges $OT Visit: 1 Visit OT Evaluation $OT Eval Moderate Complexity: 1 Mod OT Treatments $Self Care/Home Management : 8-22 mins  Ebony Hail Harold Hedge) Marsa Aris OTR/L Acute Rehabilitation Services Pager: [phone] Office: [place] 01/02/2019, 11:39 AM

## 2019-02-07 ENCOUNTER — Ambulatory Visit
Admission: RE | Admit: 2019-02-07 | Discharge: 2019-02-07 | Disposition: A | Discharge status: Home / Self Care | Payer: Medicare Other | Source: Ambulatory Visit | Attending: Nurse Practitioner | Admitting: Nurse Practitioner

## 2019-02-07 ENCOUNTER — Other Ambulatory Visit: Payer: Self-pay

## 2019-02-07 ENCOUNTER — Other Ambulatory Visit: Payer: Self-pay | Admitting: Nurse Practitioner

## 2019-02-07 ENCOUNTER — Ambulatory Visit: Payer: Medicare Other

## 2019-02-07 DIAGNOSIS — M79602 Pain in left arm: Secondary | ICD-10-CM | POA: Insufficient documentation

## 2019-02-07 DIAGNOSIS — M609 Myositis, unspecified: Secondary | ICD-10-CM | POA: Insufficient documentation

## 2019-02-07 DIAGNOSIS — M79601 Pain in right arm: Secondary | ICD-10-CM

## 2019-03-05 ENCOUNTER — Other Ambulatory Visit: Payer: Self-pay

## 2019-03-06 ENCOUNTER — Other Ambulatory Visit: Payer: Self-pay

## 2019-03-06 ENCOUNTER — Inpatient Hospital Stay: Payer: Medicare Other | Attending: Radiation Oncology

## 2019-03-06 DIAGNOSIS — C61 Malignant neoplasm of prostate: Secondary | ICD-10-CM | POA: Insufficient documentation

## 2019-03-06 LAB — PSA: Prostatic Specific Antigen: 0.07 ng/mL (ref 0.00–4.00)

## 2019-03-13 ENCOUNTER — Ambulatory Visit
Admission: RE | Admit: 2019-03-13 | Discharge: 2019-03-13 | Disposition: A | Discharge status: Home / Self Care | Payer: Medicare Other | Source: Ambulatory Visit | Attending: Radiation Oncology | Admitting: Radiation Oncology

## 2019-03-13 ENCOUNTER — Other Ambulatory Visit: Payer: Self-pay

## 2019-03-13 ENCOUNTER — Encounter: Payer: Self-pay | Admitting: Radiation Oncology

## 2019-03-13 VITALS — BP 160/90 | HR 89 | Temp 97.8°F | Wt 191.2 lb

## 2019-03-13 DIAGNOSIS — C61 Malignant neoplasm of prostate: Secondary | ICD-10-CM | POA: Insufficient documentation

## 2019-03-13 DIAGNOSIS — Z923 Personal history of irradiation: Secondary | ICD-10-CM | POA: Diagnosis not present

## 2019-03-13 DIAGNOSIS — F17210 Nicotine dependence, cigarettes, uncomplicated: Secondary | ICD-10-CM | POA: Insufficient documentation

## 2019-03-13 NOTE — Progress Notes (Signed)
Radiation Oncology Follow up Note  Name: Bruce [name]   Date:   03/13/2019 MRN:  [id number] DOB: 1946-01-24    This 74 y.o. 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  REFERRING PROVIDER: Danelle Berry, NP  HPI: Patient is a 74 year old male now out 2-1/2 years having completed I-125 interstitial implant for Gleason 7 adenocarcinoma the prostate.  He is seen today in routine follow-up continues to do extremely well with very low side effect profile.  He specifically denies diarrhea or any increased lower urinary tract symptoms.  Most recent PSA is 0.07.  This continues a downward trend in his PSA.  COMPLICATIONS OF TREATMENT: none  FOLLOW UP COMPLIANCE: keeps appointments   PHYSICAL EXAM:  BP (!) 160/90   Pulse 89   Temp 97.8 F (36.6 C)   Wt 191 lb 3.2 oz (86.7 kg)   BMI 25.23 kg/m  Well-developed well-nourished patient in NAD. HEENT reveals PERLA, EOMI, discs not visualized.  Oral cavity is clear. No oral mucosal lesions are identified. Neck is clear without evidence of cervical or supraclavicular adenopathy. Lungs are clear to A&P. Cardiac examination is essentially unremarkable with regular rate and rhythm without murmur rub or thrill. Abdomen is benign with no organomegaly or masses noted. Motor sensory and DTR levels are equal and symmetric in the upper and lower extremities. Cranial nerves II through XII are grossly intact. Proprioception is intact. No peripheral adenopathy or edema is identified. No motor or sensory levels are noted. Crude visual fields are within normal range.  RADIOLOGY RESULTS: No current films to review  PLAN: Present time patient is doing well under excellent biochemical control of his prostate cancer.  I am pleased with his overall progress.  I have asked to see him back in 1 year for follow-up.  Patient knows to call with any concerns at any time.  I  would like to take this opportunity to thank you for allowing me to participate in the care of your patient.Noreene Filbert, MD

## 2019-04-22 ENCOUNTER — Other Ambulatory Visit: Payer: Self-pay | Admitting: Radiation Oncology

## 2019-09-15 ENCOUNTER — Other Ambulatory Visit: Payer: Self-pay

## 2019-09-15 DIAGNOSIS — R55 Syncope and collapse: Secondary | ICD-10-CM | POA: Insufficient documentation

## 2019-09-15 DIAGNOSIS — R519 Headache, unspecified: Secondary | ICD-10-CM | POA: Diagnosis not present

## 2019-09-15 DIAGNOSIS — Z5321 Procedure and treatment not carried out due to patient leaving prior to being seen by health care provider: Secondary | ICD-10-CM | POA: Diagnosis not present

## 2019-09-15 DIAGNOSIS — R61 Generalized hyperhidrosis: Secondary | ICD-10-CM | POA: Diagnosis not present

## 2019-09-15 LAB — GLUCOSE, CAPILLARY: Glucose-Capillary: 155 mg/dL — ABNORMAL HIGH (ref 70–99)

## 2019-09-15 LAB — CBC
HCT: 43.2 % (ref 39.0–52.0)
Hemoglobin: 14.6 g/dL (ref 13.0–17.0)
MCH: 32.5 pg (ref 26.0–34.0)
MCHC: 33.8 g/dL (ref 30.0–36.0)
MCV: 96.2 fL (ref 80.0–100.0)
Platelets: 192 10*3/uL (ref 150–400)
RBC: 4.49 MIL/uL (ref 4.22–5.81)
RDW: 12 % (ref 11.5–15.5)
WBC: 13.6 10*3/uL — ABNORMAL HIGH (ref 4.0–10.5)
nRBC: 0 % (ref 0.0–0.2)

## 2019-09-15 LAB — TROPONIN I (HIGH SENSITIVITY): Troponin I (High Sensitivity): 10 ng/L (ref <18)

## 2019-09-15 LAB — BASIC METABOLIC PANEL
Anion gap: 9 (ref 5–15)
BUN: 13 mg/dL (ref 8–23)
CO2: 25 mmol/L (ref 22–32)
Calcium: 9.9 mg/dL (ref 8.9–10.3)
Chloride: 106 mmol/L (ref 98–111)
Creatinine, Ser: 1.61 mg/dL — ABNORMAL HIGH (ref 0.61–1.24)
GFR calc Af Amer: 49 mL/min — ABNORMAL LOW (ref >60)
GFR calc non Af Amer: 42 mL/min — ABNORMAL LOW (ref >60)
Glucose, Bld: 160 mg/dL — ABNORMAL HIGH (ref 70–99)
Potassium: 4 mmol/L (ref 3.5–5.1)
Sodium: 140 mmol/L (ref 135–145)

## 2019-09-15 NOTE — ED Triage Notes (Signed)
Reports syncope while sitting down PTA. Pt reports this has occurred before when "my BP dropped off". Reports headache over last few days. Normal PO intake. Reports severe diaphoresis and feeling hot PTA. Denies CP or SOB.

## 2019-09-16 ENCOUNTER — Emergency Department: Payer: Medicare Other

## 2019-09-16 ENCOUNTER — Telehealth: Payer: Self-pay | Admitting: Emergency Medicine

## 2019-09-16 ENCOUNTER — Emergency Department
Admission: EM | Admit: 2019-09-16 | Discharge: 2019-09-16 | Disposition: A | Discharge status: Home / Self Care | Payer: Medicare Other | Attending: Emergency Medicine | Admitting: Emergency Medicine

## 2019-09-16 LAB — HEPATIC FUNCTION PANEL
ALT: 19 U/L (ref 0–44)
AST: 20 U/L (ref 15–41)
Albumin: 4.2 g/dL (ref 3.5–5.0)
Alkaline Phosphatase: 79 U/L (ref 38–126)
Bilirubin, Direct: 0.1 mg/dL (ref 0.0–0.2)
Indirect Bilirubin: 0.8 mg/dL (ref 0.3–0.9)
Total Bilirubin: 0.9 mg/dL (ref 0.3–1.2)
Total Protein: 7.2 g/dL (ref 6.5–8.1)

## 2019-09-16 NOTE — Telephone Encounter (Signed)
Called patient due to lwot to inquire about condition and follow up plans.  No answer and no voicemail  

## 2020-01-07 IMAGING — US US EXTREM  UP VENOUS*L*
1 series · 13 of 24 positions shown · non-contrast
Comparison: None.

CLINICAL DATA: Left arm pain, myositis and history prior DVT.



[Series 1: us extrem up venous*left* · 0.08mm/px · 13 of 32 slices shown]
[im 1/32]
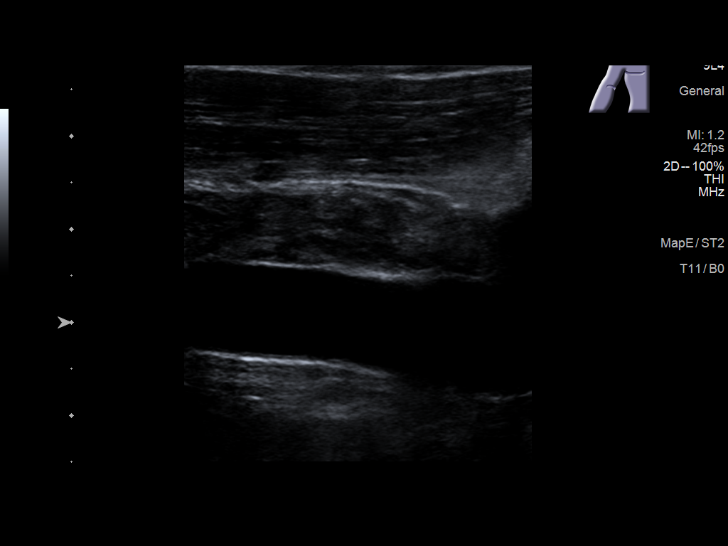
[im 3/32]
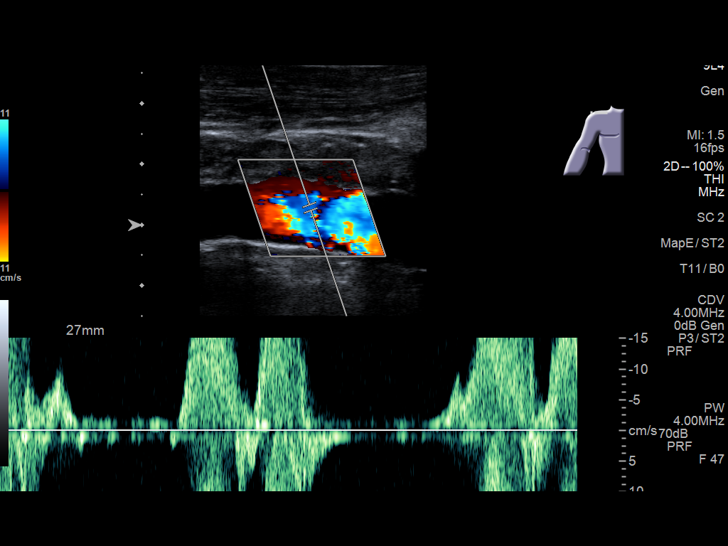
[im 6/32]
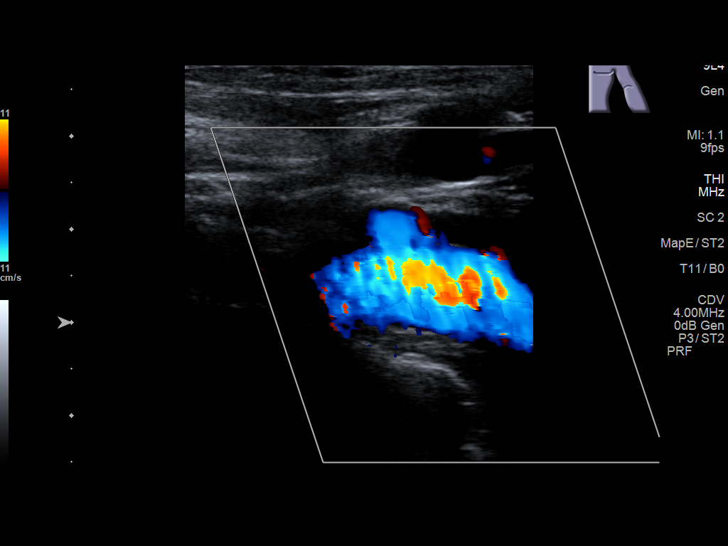
[im 9/32]
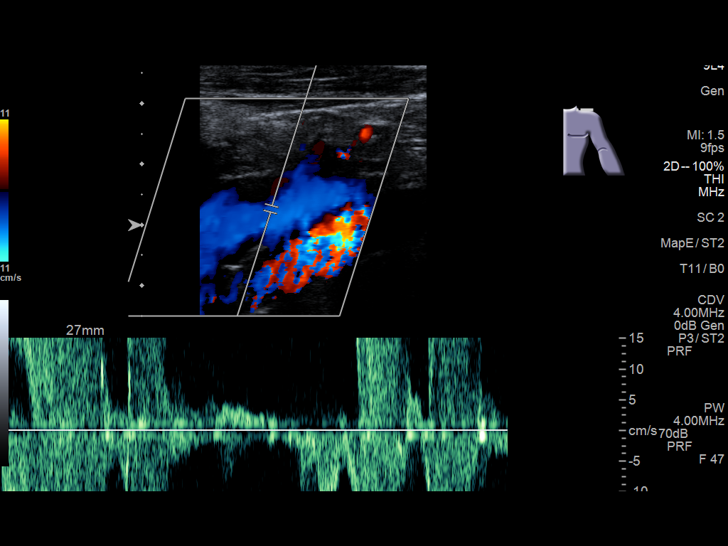
[im 11/32]
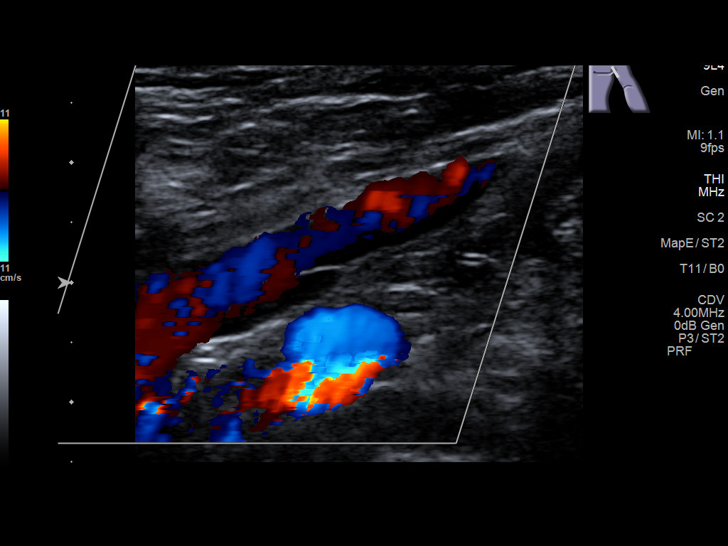
[im 14/32]
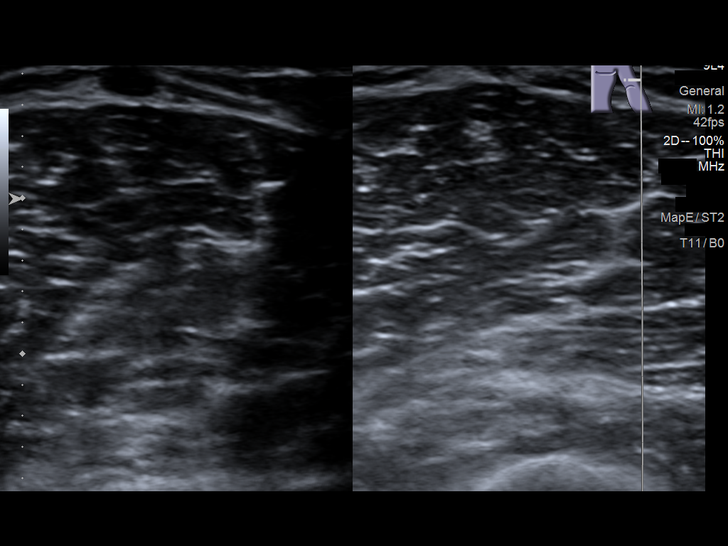
[im 17/32]
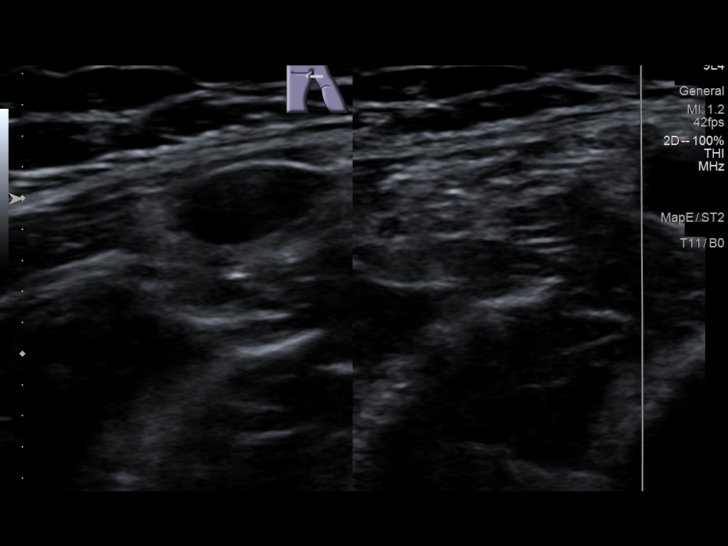
[im 18/32]
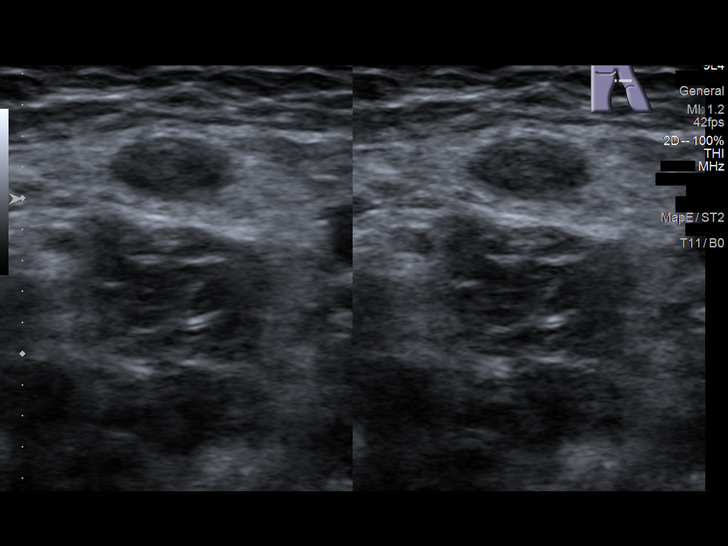
[im 21/32]
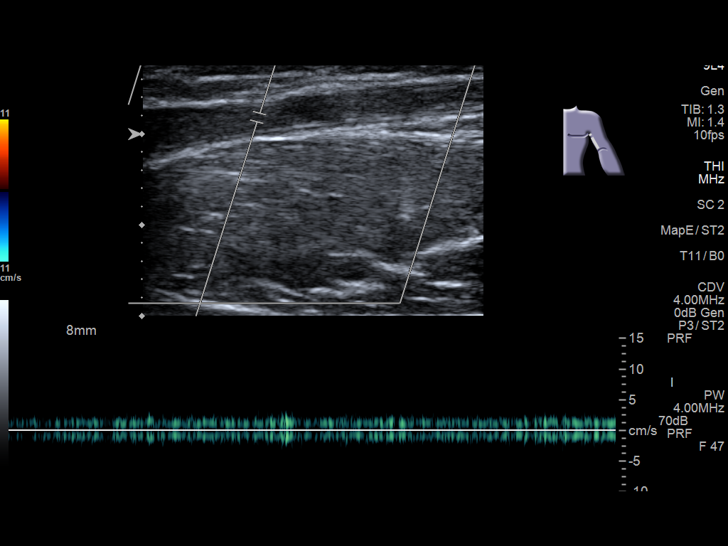
[im 23/32]
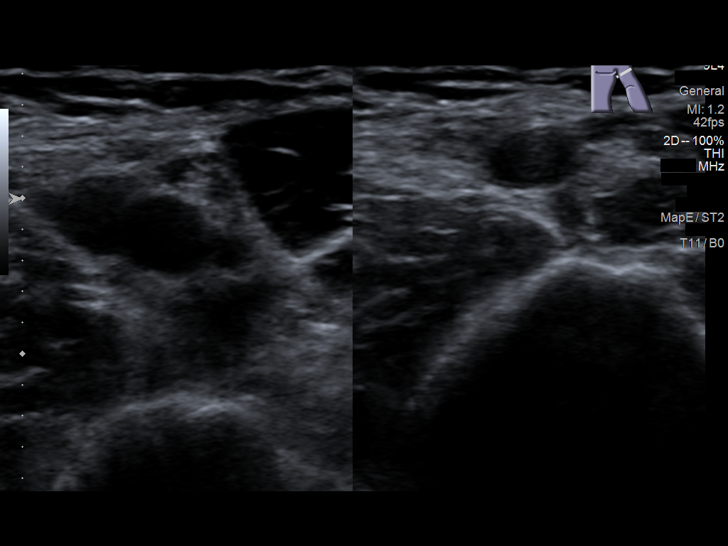
[im 26/32]
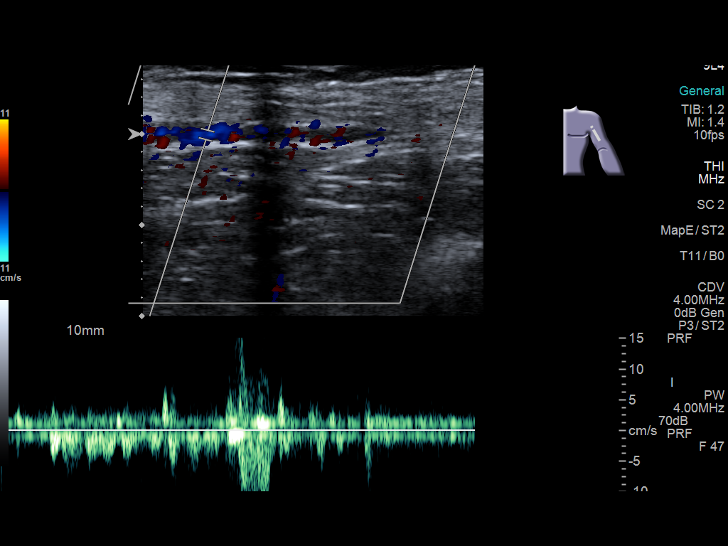
[im 29/32]
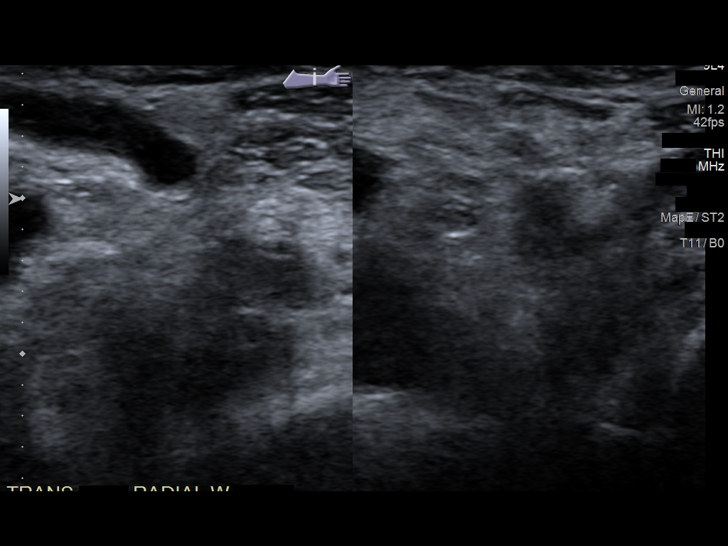
[im 32/32]
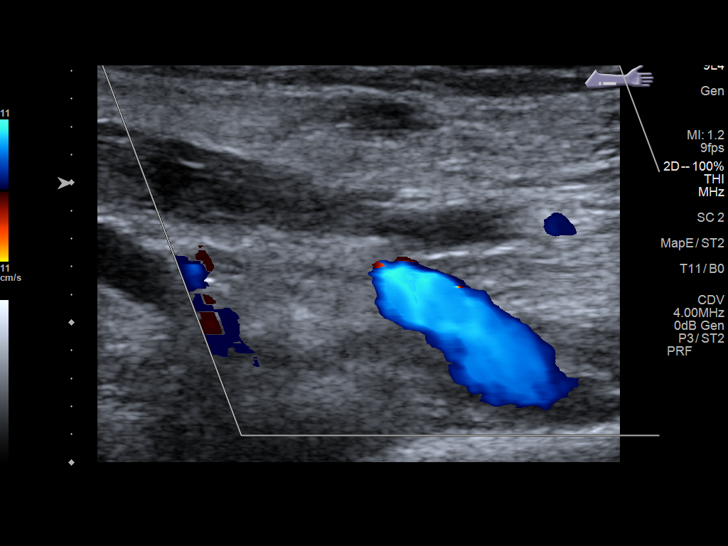

[13 of 24 positions shown; findings below may reference images not displayed]

FINDINGS: Contralateral Subclavian Vein: Respiratory phasicity is normal and
symmetric with the symptomatic side. No evidence of thrombus. Normal
compressibility.

Internal Jugular Vein: No evidence of thrombus. Normal
compressibility, respiratory phasicity and response to augmentation.

Subclavian Vein: No evidence of thrombus. Normal compressibility,
respiratory phasicity and response to augmentation.

Axillary Vein: No evidence of thrombus. Normal compressibility,
respiratory phasicity and response to augmentation.

Cephalic Vein: No evidence of thrombus. Normal compressibility,
respiratory phasicity and response to augmentation.

Basilic Vein: Segmental occlusive thrombus is noted in the left
basilic vein within the upper arm above the elbow. This appears to
distend the vein and is likely acute thrombus.

Brachial Veins: No evidence of thrombus. Normal compressibility,
respiratory phasicity and response to augmentation.

Radial Veins: No evidence of thrombus. Normal compressibility,
respiratory phasicity and response to augmentation.

Ulnar Veins: No evidence of thrombus. Normal compressibility,
respiratory phasicity and response to augmentation.

Venous Reflux:  None visualized.

Other Findings:  None visualized.
IMPRESSION: 1. Acute superficial thrombophlebitis of the left basilic vein in
the upper arm.
2. No evidence of DVT within the left upper extremity.

## 2020-03-04 ENCOUNTER — Other Ambulatory Visit: Payer: Self-pay | Admitting: *Deleted

## 2020-03-04 ENCOUNTER — Inpatient Hospital Stay: Payer: Medicare Other

## 2020-03-08 ENCOUNTER — Inpatient Hospital Stay: Payer: 59 | Attending: Radiation Oncology

## 2020-03-08 DIAGNOSIS — C61 Malignant neoplasm of prostate: Secondary | ICD-10-CM | POA: Insufficient documentation

## 2020-03-08 LAB — PSA: Prostatic Specific Antigen: 0.05 ng/mL (ref 0.00–4.00)

## 2020-03-12 ENCOUNTER — Ambulatory Visit: Payer: 59 | Admitting: Radiation Oncology

## 2020-04-09 ENCOUNTER — Ambulatory Visit: Payer: 59 | Attending: Radiation Oncology | Admitting: Radiation Oncology

## 2020-05-02 ENCOUNTER — Other Ambulatory Visit: Payer: Self-pay

## 2020-05-02 ENCOUNTER — Emergency Department: Payer: 59

## 2020-05-02 ENCOUNTER — Observation Stay
Admission: EM | Admit: 2020-05-02 | Discharge: 2020-05-03 | Disposition: A | Discharge status: Home / Self Care | Payer: 59 | Attending: Internal Medicine | Admitting: Internal Medicine

## 2020-05-02 ENCOUNTER — Encounter: Payer: Self-pay | Admitting: Radiology

## 2020-05-02 DIAGNOSIS — R079 Chest pain, unspecified: Secondary | ICD-10-CM | POA: Diagnosis present

## 2020-05-02 DIAGNOSIS — J449 Chronic obstructive pulmonary disease, unspecified: Secondary | ICD-10-CM

## 2020-05-02 DIAGNOSIS — Z86718 Personal history of other venous thrombosis and embolism: Secondary | ICD-10-CM | POA: Insufficient documentation

## 2020-05-02 DIAGNOSIS — I110 Hypertensive heart disease with heart failure: Secondary | ICD-10-CM | POA: Insufficient documentation

## 2020-05-02 DIAGNOSIS — F17200 Nicotine dependence, unspecified, uncomplicated: Secondary | ICD-10-CM | POA: Diagnosis not present

## 2020-05-02 DIAGNOSIS — I509 Heart failure, unspecified: Secondary | ICD-10-CM | POA: Insufficient documentation

## 2020-05-02 DIAGNOSIS — J441 Chronic obstructive pulmonary disease with (acute) exacerbation: Secondary | ICD-10-CM | POA: Insufficient documentation

## 2020-05-02 DIAGNOSIS — I2510 Atherosclerotic heart disease of native coronary artery without angina pectoris: Secondary | ICD-10-CM | POA: Diagnosis not present

## 2020-05-02 DIAGNOSIS — Z85038 Personal history of other malignant neoplasm of large intestine: Secondary | ICD-10-CM | POA: Insufficient documentation

## 2020-05-02 DIAGNOSIS — I739 Peripheral vascular disease, unspecified: Secondary | ICD-10-CM | POA: Diagnosis not present

## 2020-05-02 DIAGNOSIS — Z79899 Other long term (current) drug therapy: Secondary | ICD-10-CM | POA: Diagnosis not present

## 2020-05-02 DIAGNOSIS — Z7901 Long term (current) use of anticoagulants: Secondary | ICD-10-CM | POA: Diagnosis not present

## 2020-05-02 DIAGNOSIS — N400 Enlarged prostate without lower urinary tract symptoms: Secondary | ICD-10-CM | POA: Insufficient documentation

## 2020-05-02 DIAGNOSIS — Z20822 Contact with and (suspected) exposure to covid-19: Secondary | ICD-10-CM | POA: Diagnosis not present

## 2020-05-02 DIAGNOSIS — I252 Old myocardial infarction: Secondary | ICD-10-CM | POA: Diagnosis not present

## 2020-05-02 DIAGNOSIS — E785 Hyperlipidemia, unspecified: Secondary | ICD-10-CM | POA: Insufficient documentation

## 2020-05-02 DIAGNOSIS — J189 Pneumonia, unspecified organism: Secondary | ICD-10-CM

## 2020-05-02 LAB — COMPREHENSIVE METABOLIC PANEL
ALT: 18 U/L (ref 0–44)
AST: 17 U/L (ref 15–41)
Albumin: 4.3 g/dL (ref 3.5–5.0)
Alkaline Phosphatase: 80 U/L (ref 38–126)
Anion gap: 8 (ref 5–15)
BUN: 17 mg/dL (ref 8–23)
CO2: 25 mmol/L (ref 22–32)
Calcium: 9.7 mg/dL (ref 8.9–10.3)
Chloride: 108 mmol/L (ref 98–111)
Creatinine, Ser: 1.03 mg/dL (ref 0.61–1.24)
GFR, Estimated: >60 mL/min (ref >60)
Glucose, Bld: 121 mg/dL — ABNORMAL HIGH (ref 70–99)
Potassium: 3.9 mmol/L (ref 3.5–5.1)
Sodium: 141 mmol/L (ref 135–145)
Total Bilirubin: 0.7 mg/dL (ref 0.3–1.2)
Total Protein: 7.4 g/dL (ref 6.5–8.1)

## 2020-05-02 LAB — CBC WITH DIFFERENTIAL/PLATELET
Abs Immature Granulocytes: 0.01 10*3/uL (ref 0.00–0.07)
Basophils Absolute: 0 10*3/uL (ref 0.0–0.1)
Basophils Relative: 0 %
Eosinophils Absolute: 0.2 10*3/uL (ref 0.0–0.5)
Eosinophils Relative: 3 %
HCT: 42.8 % (ref 39.0–52.0)
Hemoglobin: 14.3 g/dL (ref 13.0–17.0)
Immature Granulocytes: 0 %
Lymphocytes Relative: 23 %
Lymphs Abs: 1.3 10*3/uL (ref 0.7–4.0)
MCH: 32.1 pg (ref 26.0–34.0)
MCHC: 33.4 g/dL (ref 30.0–36.0)
MCV: 96.2 fL (ref 80.0–100.0)
Monocytes Absolute: 0.3 10*3/uL (ref 0.1–1.0)
Monocytes Relative: 6 %
Neutro Abs: 3.9 10*3/uL (ref 1.7–7.7)
Neutrophils Relative %: 68 %
Platelets: 208 10*3/uL (ref 150–400)
RBC: 4.45 MIL/uL (ref 4.22–5.81)
RDW: 13.3 % (ref 11.5–15.5)
WBC: 5.8 10*3/uL (ref 4.0–10.5)
nRBC: 0 % (ref 0.0–0.2)

## 2020-05-02 LAB — TROPONIN I (HIGH SENSITIVITY)
Troponin I (High Sensitivity): 10 ng/L (ref <18)
Troponin I (High Sensitivity): 10 ng/L (ref <18)

## 2020-05-02 LAB — PROTIME-INR
INR: 1 (ref 0.8–1.2)
Prothrombin Time: 13.1 seconds (ref 11.4–15.2)

## 2020-05-02 LAB — RESP PANEL BY RT-PCR (FLU A&B, COVID) ARPGX2
Influenza A by PCR: NEGATIVE
Influenza B by PCR: NEGATIVE
SARS Coronavirus 2 by RT PCR: NEGATIVE

## 2020-05-02 LAB — D-DIMER, QUANTITATIVE: D-Dimer, Quant: 0.93 ug/mL-FEU — ABNORMAL HIGH (ref 0.00–0.50)

## 2020-05-02 LAB — PROCALCITONIN: Procalcitonin: <0.1 ng/mL

## 2020-05-02 LAB — BRAIN NATRIURETIC PEPTIDE: B Natriuretic Peptide: 46.9 pg/mL (ref 0.0–100.0)

## 2020-05-02 MED ORDER — SODIUM CHLORIDE 0.9 % IV SOLN
500.0000 mg | Freq: Once | INTRAVENOUS | Status: AC
  Administered 2020-05-02: 500 mg via INTRAVENOUS
  Filled 2020-05-02: qty 500

## 2020-05-02 MED ORDER — IPRATROPIUM-ALBUTEROL 0.5-2.5 (3) MG/3ML IN SOLN
3.0000 mL | Freq: Once | RESPIRATORY_TRACT | Status: AC
  Administered 2020-05-02: 3 mL via RESPIRATORY_TRACT
  Filled 2020-05-02: qty 3

## 2020-05-02 MED ORDER — ASPIRIN 81 MG PO CHEW
324.0000 mg | CHEWABLE_TABLET | Freq: Once | ORAL | Status: AC
  Administered 2020-05-02: 324 mg via ORAL
  Filled 2020-05-02: qty 4

## 2020-05-02 MED ORDER — MAGNESIUM SULFATE IN D5W 1-5 GM/100ML-% IV SOLN
1.0000 g | Freq: Once | INTRAVENOUS | Status: AC
  Administered 2020-05-02: 1 g via INTRAVENOUS
  Filled 2020-05-02: qty 100

## 2020-05-02 MED ORDER — IOHEXOL 350 MG/ML SOLN
75.0000 mL | Freq: Once | INTRAVENOUS | Status: AC | PRN
  Administered 2020-05-02: 75 mL via INTRAVENOUS

## 2020-05-02 MED ORDER — METHYLPREDNISOLONE SODIUM SUCC 125 MG IJ SOLR
125.0000 mg | INTRAMUSCULAR | Status: AC
  Administered 2020-05-02: 125 mg via INTRAVENOUS
  Filled 2020-05-02: qty 2

## 2020-05-02 MED ORDER — SODIUM CHLORIDE 0.9 % IV SOLN
2.0000 g | Freq: Once | INTRAVENOUS | Status: AC
  Administered 2020-05-02: 2 g via INTRAVENOUS
  Filled 2020-05-02: qty 20

## 2020-05-02 NOTE — ED Triage Notes (Signed)
Pt complains of left sided chest pain, shoulder pain, arm pain and left hand tingling with shob for 45 minutes. Pt states "I think i'm having another heart attack" pt appears in distress.

## 2020-05-02 NOTE — ED Provider Notes (Signed)
[facility] Emergency Department Provider Note   ____________________________________________   Event Date/Time   First MD Initiated Contact with Patient 05/02/20 2001     (approximate)  I have reviewed the triage vital signs and the nursing notes.   HISTORY  Chief Complaint Chest Pain    HPI Bruce [name] is a 75 y.o. male reports to me that he feels like he is having a heart attack.  He reports he feels short of breath and for about 45 minutes his chest versus felt tight radiation to his left arm.  He does have a history of prior heart attacks, but also reports that he has been wheezing.  He has felt a little off throughout the day today feeling like he had some tightness in his chest and some slight difficulty breathing he does use inhalers at home  Patient reports that he is having some chest pain but mostly shortness of breath and feels very tight especially over his chest  No recent fevers or chills no recent hospitalizations.  No nausea or vomiting.  Abdominal pain   Past Medical History:  Diagnosis Date  . Anginal pain ([place])    Left side if chest ,NTG  relieves chaes apin 12/01/13  . Anxiety   . Arthritis   . Cancer [facility])    colon cancer  . CHF (congestive heart failure) ([place])   . 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 Complication of anesthesia    wake up with a head ache  . COPD (chronic obstructive pulmonary disease) ([place])   . Coronary artery disease   . GERD (gastroesophageal reflux disease)   . Headache    related to sinus congestion  . History of blood transfusion   . History of kidney stones   . Hypertension   . Myocardial infarction [facility])    '09 AND '12  . Peripheral vascular disease ([place])   . Shortness of breath    With exertion .  Marland Kitchen UTI (urinary tract infection)    frequent UTI    Patient Active Problem List   Diagnosis Date Noted  . PAF (paroxysmal atrial fibrillation)  ([place])   . Syncope 12/31/2018  . Intracranial bleed ([place]) 12/31/2018  . AKI (acute kidney injury) ([place])   . Frontal lobe contusion ([place])   . Multiple closed facial bone fractures ([place])   . NSTEMI (non-ST elevated myocardial infarction) ([place]) 10/10/2018  . Recurrent syncope 04/28/2018  . Malignant neoplasm of prostate ([place]) 07/13/2016  . 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 Visual hallucination 03/18/2015  . Auditory hallucination 03/18/2015  . 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 Tibial plateau fracture 12/29/2014  . Limb ischemia 11/18/2013  . Blunt trauma of lower leg 10/08/2013  . 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 Acute blood loss anemia 10/08/2013  . Anxiety disorder 10/08/2013  . Dyslipidemia 10/08/2013  . GERD (gastroesophageal reflux disease) 10/08/2013  . History of MI (myocardial infarction) 10/08/2013  . Anticoagulated 10/08/2013  . Hypertension   . Tibia/fibula fracture 10/06/2013  . CAD (coronary artery disease) 08/16/2010  . Edema 08/16/2010  . COPD (chronic obstructive pulmonary disease) ([place]) 08/16/2010  . DVT of leg (deep venous thrombosis) ([place]) 08/16/2010  . Leg cramps 08/16/2010    Past Surgical History:  Procedure Laterality Date  . CARDIAC CATHETERIZATION    . CHOLECYSTECTOMY    . EXTERNAL FIXATION LEG Right 10/06/2013   Procedure: CLOSED  REDUCTION RIGHT TIBIAL PLATEAU FRACTURE, EXTERNAL FIXATION RIGHT LEG, PLACEMENT OF WOUND VAC;  Surgeon: Rozanna Box, MD;  Location: [place];  Service: Orthopedics;  Laterality: Right;  . FEMORAL-POPLITEAL BYPASS GRAFT Right 10/06/2013   Procedure: RIGHT POPLITEAL-POPLITEAL ARTERY BYPASS GRAFT;  Surgeon: Rosetta Posner, MD;  Location: [place];  Service: Vascular;  Laterality: Right;  . FRACTURE SURGERY Right 2014  . HARDWARE REMOVAL Right 12/02/2013   Procedure: REMOVAL EXTERNAL FIXATION RIGHT LEG ;  Surgeon: Rozanna Box, MD;  Location: [place];  Service:  Orthopedics;  Laterality: Right;  . HERNIA REPAIR Right 1990's  . I & D EXTREMITY Right 10/09/2013   Procedure: IRRIGATION AND DEBRIDEMENT RIGHT LEG, CLOSURE  OF WOUNDS, PLACEMENT OF WOUND VAC ON EACH SIDE OF LEG;  Surgeon: Rozanna Box, MD;  Location: [place];  Service: Orthopedics;  Laterality: Right;  . IR NEPHROSTOMY PLACEMENT LEFT  06/19/2016  . IVC filter  2009   placed @ [facility]/ Removed in 2010.  . IVC Filter Removed    . LEFT HEART CATH AND CORONARY ANGIOGRAPHY Right 10/11/2018   Procedure: LEFT HEART CATH AND CORONARY ANGIOGRAPHY;  Surgeon: [name], MD;  Location: [place] [facility];  Service: Cardiovascular;  Laterality: Right;  . ligament leg Left   . NEPHROLITHOTOMY Left 06/19/2016   Procedure: NEPHROLITHOTOMY PERCUTANEOUS;  Surgeon: Hollice Espy, MD;  Location: [facility];  Service: Urology;  Laterality: Left;  . ORIF TIBIA FRACTURE Right 12/29/2014   Procedure: OPEN REDUCTION INTERNAL FIXATION (ORIF) RIGHT TIBIA FRACTURE, RIA VS ICBG;  Surgeon: Altamese Robesonia, MD;  Location: [place];  Service: Orthopedics;  Laterality: Right;  . RADIOACTIVE SEED IMPLANT N/A 09/12/2016   Procedure: RADIOACTIVE SEED IMPLANT/BRACHYTHERAPY IMPLANT;  Surgeon: Hollice Espy, MD;  Location: [facility];  Service: Urology;  Laterality: N/A;    Prior to Admission medications   Medication Sig Start Date End Date Taking? Authorizing Provider  acetaminophen (TYLENOL) 325 MG tablet Take 2 tablets (650 mg total) by mouth every 6 (six) hours as needed for mild pain or headache. 10/11/18   Gouru, Illene Silver, MD  albuterol (PROVENTIL HFA;VENTOLIN HFA) 108 (90 Base) MCG/ACT inhaler Inhale 2 puffs into the lungs every 6 (six) hours as needed for wheezing or shortness of breath. 04/30/18   Pyreddy, Reatha Harps, MD  atorvastatin (LIPITOR) 80 MG tablet Take 1 tablet (80 mg total) by mouth at bedtime. 01/02/19 02/01/19  Kayleen Memos, DO  carvedilol (COREG) 12.5 MG tablet Take 1 tablet (12.5 mg total) by mouth 2 (two) times daily.  01/02/19   Kayleen Memos, DO  cholecalciferol (VITAMIN D) 1000 units tablet Take 1,000 Units by mouth daily.     [provider]  ezetimibe (ZETIA) 10 MG tablet Take 1 tablet (10 mg total) by mouth daily. 01/03/19   Kayleen Memos, DO  HYDROcodone-acetaminophen (NORCO/VICODIN) 5-325 MG tablet Take 1 tablet by mouth daily as needed for moderate pain or severe pain. 01/02/19   Kayleen Memos, DO  loperamide (IMODIUM) 2 MG capsule Take 2 mg by mouth as needed for diarrhea or loose stools.     [provider]  montelukast (SINGULAIR) 10 MG tablet Take 1 tablet (10 mg total) by mouth at bedtime. 01/02/19   Kayleen Memos, DO  Multiple Vitamin (MULTIVITAMIN) tablet Take 1 tablet by mouth daily. For Men    [provider]  nitroGLYCERIN (NITROSTAT) 0.4 MG SL tablet Place 0.4 mg under the tongue every 5 (five) minutes as needed for chest  pain.     [provider]  Omega-3 Fatty Acids (FISH OIL PO) Take 1,000 mg by mouth every evening.     [provider]  omeprazole (PRILOSEC) 40 MG capsule Take 40 mg by mouth 2 (two) times daily.     [provider]  REPATHA SURECLICK 244 MG/ML SOAJ Inject 1 Syringe into the skin every 14 (fourteen) days. 12/10/18   [provider]  rivaroxaban (XARELTO) 20 MG TABS tablet Take 1 tablet (20 mg total) by mouth daily. 01/06/19   Kayleen Memos, DO  rOPINIRole (REQUIP) 1 MG tablet Take 1 mg by mouth at bedtime. 11/04/18   [provider]  tamsulosin (FLOMAX) 0.4 MG CAPS capsule TAKE 1 CAPSULE BY MOUTH ONCE DAILY AFTERSUPPER 06/29/19   Noreene Filbert, MD    Allergies Adhesive [tape], Imdur [isosorbide dinitrate], and Singulair [montelukast sodium]  Family History  Problem Relation Age of Onset  . Hypertension Mother   . Prostate cancer Brother   . Mental illness Neg Hx     Social History Social History   Tobacco Use  . Smoking status: Current Some Day Smoker    Packs/day: 0.50    Years: 52.00     Pack years: 26.00  . Smokeless tobacco: Never Used  Vaping Use  . Vaping Use: Never used  Substance Use Topics  . Alcohol use: No    Alcohol/week: 0.0 standard drinks    Comment: Stopped 2009  . Drug use: No    Review of Systems Constitutional: No fever/chills Eyes: No visual changes. ENT: No sore throat. Cardiovascular: See HPI Respiratory: See HPI also some wheezing Gastrointestinal: No abdominal pain.   Genitourinary: Negative for dysuria. Musculoskeletal: Negative for back pain. Skin: Negative for rash. Neurological: Negative for headaches, areas of focal weakness or numbness.    ____________________________________________   PHYSICAL EXAM:  VITAL SIGNS: ED Triage Vitals  Enc Vitals Group     BP 05/02/20 1944 126/86     Pulse Rate 05/02/20 1944 91     Resp 05/02/20 1942 (!) 24     Temp --      Temp Source 05/02/20 1944 Oral     SpO2 05/02/20 1944 (!) 89 %     Weight 05/02/20 1942 170 lb (77.1 kg)     Height 05/02/20 1942 6\' 1"  (1.854 m)     Head Circumference --      Peak Flow --      Pain Score 05/02/20 1942 10     Pain Loc --      Pain Edu? --      Excl. in [place]? --     Constitutional: Alert and oriented.  Moderately ill-appearing, sitting up appearing moderately dyspneic with audible wheezing Eyes: Conjunctivae are normal. Head: Atraumatic. Nose: No congestion/rhinnorhea. Mouth/Throat: Mucous membranes are moist. Neck: No stridor.  Cardiovascular: Slightly tachycardic rate, regular rhythm. Grossly normal heart sounds.  Good peripheral circulation. Respiratory: Tachypnea, diffuse end expiratory wheezing.  Occasional dry cough.  No crackles noted.  Mild accessory muscle use speaking in phrases. Gastrointestinal: Soft and nontender. No distention. Musculoskeletal: No lower extremity tenderness nor edema.  No venous cords or congestion noted. Neurologic:  Normal speech and language. No gross focal neurologic deficits are appreciated.  Skin:  Skin is  warm, dry and intact. No rash noted. Psychiatric: Mood and affect are normal. Speech and behavior are normal.  ____________________________________________   LABS (all labs ordered are listed, but only abnormal results are displayed)  Labs Reviewed  COMPREHENSIVE METABOLIC PANEL - Abnormal; Notable for the following components:      Result Value   Glucose, Bld 121 (*)    All other components within normal limits  RESP PANEL BY RT-PCR (FLU A&B, COVID) ARPGX2  CULTURE, BLOOD (ROUTINE X 2)  CULTURE, BLOOD (ROUTINE X 2)  CBC WITH DIFFERENTIAL/PLATELET  PROTIME-INR  BRAIN NATRIURETIC PEPTIDE  D-DIMER, QUANTITATIVE  PROCALCITONIN  TROPONIN I (HIGH SENSITIVITY)   ____________________________________________  EKG  Reviewed interpreted by me at 1940 Heart rate 110 QRS 90 QTc 440 Sinus tachycardia, nonspecific T wave abnormality noted, somewhat inferior possibly some depressions as well.  No ST elevation.  Slight artifact. ____________________________________________  RADIOLOGY  DG Chest 1 View  Result Date: 05/02/2020 CLINICAL DATA:  Acute onset of left-sided chest pain and shortness of breath. EXAM: CHEST  1 VIEW COMPARISON:  10/10/2018 FINDINGS: The heart size and mediastinal contours are within normal limits. Pulmonary hyperinflation again noted. Subtle patchy opacities are seen in the peripheral upper lobes bilaterally, suspicious for pneumonia. No evidence of pneumothorax or pleural effusion. IMPRESSION: Subtle patchy opacities in both upper lobes, suspicious for pneumonia. Recommend continued chest radiographic follow-up to confirm resolution. Electronically Signed   By: Marlaine Hind M.D.   On: 05/02/2020 20:09    Chest x-ray reviewed concerning for possible patchy opacities in the upper lobes.  No pneumothorax.  No cardiomegaly. ____________________________________________   PROCEDURES  Procedure(s) performed: None  Procedures  Critical Care performed: Yes, see  critical care note(s)  CRITICAL CARE Performed by: Delman Kitten   Total critical care time: 30 minutes  Critical care time was exclusive of separately billable procedures and treating other patients.  Critical care was necessary to treat or prevent imminent or life-threatening deterioration.  Critical care was time spent personally by me on the following activities: development of treatment plan with patient and/or surrogate as well as nursing, discussions with consultants, evaluation of patient's response to treatment, examination of patient, obtaining history from patient or surrogate, ordering and performing treatments and interventions, ordering and review of laboratory studies, ordering and review of radiographic studies, pulse oximetry and re-evaluation of patient's condition.  ____________________________________________   INITIAL IMPRESSION / ASSESSMENT AND PLAN / ED COURSE  Pertinent labs & imaging results that were available during my care of the patient were reviewed by me and considered in my medical decision making (see chart for details).   Differential diagnosis includes, but is not limited to, ACS, aortic dissection, pulmonary embolism, cardiac tamponade, pneumothorax, pneumonia, pericarditis, myocarditis, GI-related causes including esophagitis/gastritis, and musculoskeletal chest wall pain.    Given the patient's presentation I suspect COPD or respiratory disease to be his primary driver of chest pain and chest tightness he is wheezing notably and has a history of COPD.  Chest x-ray concerning for pneumonia.  Clinical Course as of 05/02/20 2102  Nancy Fetter May 02, 2020  2056 Reassessment, patient respiratory status improving.  Vital signs improving his work of breathing improving.  Ongoing care assigned to Dr. Archie Balboa, follow-up on pending laboratory testing including Covid, procalcitonin, D-dimer for PE exclusion, BNP, CMP, troponin.  Patient reassessments to therapy and expect  admission [MQ]    Clinical Course User Index [MQ] Delman Kitten, MD   ----------------------------------------- 9:01 PM on 05/02/2020 -----------------------------------------  Patient feeling improved.  Respirations even and unlabored saturation 99% on room air.  He appears improved reports his breathing feels much better.  Still having some chest tightness, repeat EKG  Repeat EKG reviewed inter by me at 2058 Heart  rate 95 QRS 90 QTc 450 Normal sinus rhythm, slight repolarization abnormality.  No evidence of acute ischemia denoted    ____________________________________________   FINAL CLINICAL IMPRESSION(S) / ED DIAGNOSES  Final diagnoses:  Community acquired pneumonia, unspecified laterality  COPD with acute exacerbation ([place])  Chest pain, moderate coronary artery risk        Note:  This document was prepared using Dragon voice recognition software and may include unintentional dictation errors       Delman Kitten, MD 05/02/20 2102

## 2020-05-03 ENCOUNTER — Other Ambulatory Visit: Payer: Self-pay

## 2020-05-03 ENCOUNTER — Encounter: Payer: Self-pay | Admitting: Family Medicine

## 2020-05-03 DIAGNOSIS — J189 Pneumonia, unspecified organism: Secondary | ICD-10-CM

## 2020-05-03 DIAGNOSIS — I10 Essential (primary) hypertension: Secondary | ICD-10-CM

## 2020-05-03 DIAGNOSIS — R079 Chest pain, unspecified: Secondary | ICD-10-CM | POA: Diagnosis not present

## 2020-05-03 DIAGNOSIS — J441 Chronic obstructive pulmonary disease with (acute) exacerbation: Secondary | ICD-10-CM

## 2020-05-03 LAB — BASIC METABOLIC PANEL
Anion gap: 8 (ref 5–15)
BUN: 18 mg/dL (ref 8–23)
CO2: 22 mmol/L (ref 22–32)
Calcium: 9 mg/dL (ref 8.9–10.3)
Chloride: 110 mmol/L (ref 98–111)
Creatinine, Ser: 0.86 mg/dL (ref 0.61–1.24)
GFR, Estimated: >60 mL/min (ref >60)
Glucose, Bld: 180 mg/dL — ABNORMAL HIGH (ref 70–99)
Potassium: 4.1 mmol/L (ref 3.5–5.1)
Sodium: 140 mmol/L (ref 135–145)

## 2020-05-03 LAB — CBC
HCT: 36.3 % — ABNORMAL LOW (ref 39.0–52.0)
Hemoglobin: 12.2 g/dL — ABNORMAL LOW (ref 13.0–17.0)
MCH: 32.1 pg (ref 26.0–34.0)
MCHC: 33.6 g/dL (ref 30.0–36.0)
MCV: 95.5 fL (ref 80.0–100.0)
Platelets: 179 10*3/uL (ref 150–400)
RBC: 3.8 MIL/uL — ABNORMAL LOW (ref 4.22–5.81)
RDW: 13.3 % (ref 11.5–15.5)
WBC: 4.8 10*3/uL (ref 4.0–10.5)
nRBC: 0 % (ref 0.0–0.2)

## 2020-05-03 LAB — STREP PNEUMONIAE URINARY ANTIGEN: Strep Pneumo Urinary Antigen: NEGATIVE

## 2020-05-03 MED ORDER — ONDANSETRON HCL 4 MG PO TABS: 4.0000 mg | ORAL_TABLET | Freq: Four times a day (QID) | ORAL | Status: DC | PRN

## 2020-05-03 MED ORDER — IPRATROPIUM-ALBUTEROL 0.5-2.5 (3) MG/3ML IN SOLN
3.0000 mL | Freq: Four times a day (QID) | RESPIRATORY_TRACT | Status: DC
  Administered 2020-05-03: 3 mL via RESPIRATORY_TRACT
  Filled 2020-05-03: qty 3

## 2020-05-03 MED ORDER — METHYLPREDNISOLONE SODIUM SUCC 40 MG IJ SOLR
40.0000 mg | Freq: Four times a day (QID) | INTRAMUSCULAR | Status: DC
  Administered 2020-05-03 (×2): 40 mg via INTRAVENOUS
  Filled 2020-05-03 (×2): qty 1

## 2020-05-03 MED ORDER — NITROGLYCERIN 0.4 MG SL SUBL: 0.4000 mg | SUBLINGUAL_TABLET | SUBLINGUAL | Status: DC | PRN

## 2020-05-03 MED ORDER — PREDNISONE 20 MG PO TABS: 40.0000 mg | ORAL_TABLET | Freq: Every day | ORAL | 0 refills | Status: AC

## 2020-05-03 MED ORDER — RIVAROXABAN 20 MG PO TABS
20.0000 mg | ORAL_TABLET | Freq: Every day | ORAL | Status: DC
  Administered 2020-05-03: 20 mg via ORAL
  Filled 2020-05-03: qty 1

## 2020-05-03 MED ORDER — TRAZODONE HCL 50 MG PO TABS: 25.0000 mg | ORAL_TABLET | Freq: Every evening | ORAL | Status: DC | PRN

## 2020-05-03 MED ORDER — DM-GUAIFENESIN ER 30-600 MG PO TB12
1.0000 | ORAL_TABLET | Freq: Two times a day (BID) | ORAL | Status: DC
  Administered 2020-05-03 (×2): 1 via ORAL
  Filled 2020-05-03 (×2): qty 1

## 2020-05-03 MED ORDER — GUAIFENESIN ER 600 MG PO TB12
600.0000 mg | ORAL_TABLET | Freq: Two times a day (BID) | ORAL | Status: DC
  Administered 2020-05-03: 600 mg via ORAL
  Filled 2020-05-03: qty 1

## 2020-05-03 MED ORDER — OXYCODONE-ACETAMINOPHEN 5-325 MG PO TABS: 1.0000 | ORAL_TABLET | Freq: Two times a day (BID) | ORAL | Status: DC | PRN

## 2020-05-03 MED ORDER — EZETIMIBE 10 MG PO TABS: 10.0000 mg | ORAL_TABLET | Freq: Every day | ORAL | Status: DC

## 2020-05-03 MED ORDER — IPRATROPIUM-ALBUTEROL 0.5-2.5 (3) MG/3ML IN SOLN: 3.0000 mL | RESPIRATORY_TRACT | Status: DC | PRN

## 2020-05-03 MED ORDER — ACETAMINOPHEN 650 MG RE SUPP: 650.0000 mg | Freq: Four times a day (QID) | RECTAL | Status: DC | PRN

## 2020-05-03 MED ORDER — HYDROCODONE-ACETAMINOPHEN 5-325 MG PO TABS: 1.0000 | ORAL_TABLET | Freq: Every day | ORAL | Status: DC | PRN

## 2020-05-03 MED ORDER — ALPRAZOLAM 0.5 MG PO TABS: 0.5000 mg | ORAL_TABLET | Freq: Two times a day (BID) | ORAL | Status: DC | PRN

## 2020-05-03 MED ORDER — CARVEDILOL 12.5 MG PO TABS
12.5000 mg | ORAL_TABLET | Freq: Two times a day (BID) | ORAL | Status: DC
  Administered 2020-05-03 (×2): 12.5 mg via ORAL
  Filled 2020-05-03: qty 2
  Filled 2020-05-03: qty 1

## 2020-05-03 MED ORDER — ONDANSETRON HCL 4 MG/2ML IJ SOLN: 4.0000 mg | Freq: Four times a day (QID) | INTRAMUSCULAR | Status: DC | PRN

## 2020-05-03 MED ORDER — SODIUM CHLORIDE 0.9 % IV SOLN
2.0000 g | INTRAVENOUS | Status: DC
  Filled 2020-05-03: qty 20

## 2020-05-03 MED ORDER — LOSARTAN POTASSIUM 50 MG PO TABS
50.0000 mg | ORAL_TABLET | Freq: Every day | ORAL | Status: DC
  Administered 2020-05-03: 50 mg via ORAL
  Filled 2020-05-03: qty 1

## 2020-05-03 MED ORDER — VITAMIN D3 25 MCG (1000 UNIT) PO TABS
1000.0000 [IU] | ORAL_TABLET | Freq: Every day | ORAL | Status: DC
  Administered 2020-05-03: 1000 [IU] via ORAL
  Filled 2020-05-03 (×2): qty 1

## 2020-05-03 MED ORDER — LOPERAMIDE HCL 2 MG PO CAPS: 2.0000 mg | ORAL_CAPSULE | ORAL | Status: DC | PRN

## 2020-05-03 MED ORDER — OMEGA-3-ACID ETHYL ESTERS 1 G PO CAPS
1.0000 g | ORAL_CAPSULE | Freq: Every day | ORAL | Status: DC
  Administered 2020-05-03: 1 g via ORAL
  Filled 2020-05-03 (×2): qty 1

## 2020-05-03 MED ORDER — ALBUTEROL SULFATE (2.5 MG/3ML) 0.083% IN NEBU
5.0000 mg | INHALATION_SOLUTION | Freq: Once | RESPIRATORY_TRACT | Status: AC
  Administered 2020-05-03: 5 mg via RESPIRATORY_TRACT
  Filled 2020-05-03: qty 6

## 2020-05-03 MED ORDER — IPRATROPIUM-ALBUTEROL 0.5-2.5 (3) MG/3ML IN SOLN: 3.0000 mL | Freq: Two times a day (BID) | RESPIRATORY_TRACT | Status: DC

## 2020-05-03 MED ORDER — FLUTICASONE PROPIONATE 50 MCG/ACT NA SUSP
1.0000 | Freq: Every day | NASAL | Status: DC
  Administered 2020-05-03: 1 via NASAL
  Filled 2020-05-03: qty 16

## 2020-05-03 MED ORDER — MONTELUKAST SODIUM 10 MG PO TABS: 10.0000 mg | ORAL_TABLET | Freq: Every day | ORAL | Status: DC

## 2020-05-03 MED ORDER — ROPINIROLE HCL 1 MG PO TABS
1.0000 mg | ORAL_TABLET | Freq: Every day | ORAL | Status: DC
  Administered 2020-05-03: 1 mg via ORAL
  Filled 2020-05-03: qty 1

## 2020-05-03 MED ORDER — ENOXAPARIN SODIUM 40 MG/0.4ML ~~LOC~~ SOLN: 40.0000 mg | SUBCUTANEOUS | Status: DC

## 2020-05-03 MED ORDER — MAGNESIUM HYDROXIDE 400 MG/5ML PO SUSP: 30.0000 mL | Freq: Every day | ORAL | Status: DC | PRN

## 2020-05-03 MED ORDER — PREDNISONE 20 MG PO TABS: 40.0000 mg | ORAL_TABLET | Freq: Every day | ORAL | Status: DC

## 2020-05-03 MED ORDER — TAMSULOSIN HCL 0.4 MG PO CAPS
0.4000 mg | ORAL_CAPSULE | Freq: Every day | ORAL | Status: DC
  Administered 2020-05-03: 0.4 mg via ORAL
  Filled 2020-05-03: qty 1

## 2020-05-03 MED ORDER — ADULT MULTIVITAMIN W/MINERALS CH
1.0000 | ORAL_TABLET | Freq: Every day | ORAL | Status: DC
  Administered 2020-05-03: 1 via ORAL
  Filled 2020-05-03: qty 1

## 2020-05-03 MED ORDER — SODIUM CHLORIDE 0.9 % IV SOLN: INTRAVENOUS | Status: DC

## 2020-05-03 MED ORDER — AZITHROMYCIN 500 MG IV SOLR
500.0000 mg | INTRAVENOUS | Status: DC
Start: 2020-05-03 — End: 2020-05-03
  Filled 2020-05-03: qty 500

## 2020-05-03 MED ORDER — PANTOPRAZOLE SODIUM 40 MG PO TBEC
40.0000 mg | DELAYED_RELEASE_TABLET | Freq: Every day | ORAL | Status: DC
  Administered 2020-05-03: 40 mg via ORAL
  Filled 2020-05-03: qty 1

## 2020-05-03 MED ORDER — ACETAMINOPHEN 325 MG PO TABS: 650.0000 mg | ORAL_TABLET | Freq: Four times a day (QID) | ORAL | Status: DC | PRN

## 2020-05-03 MED ORDER — ATORVASTATIN CALCIUM 20 MG PO TABS: 80.0000 mg | ORAL_TABLET | Freq: Every day | ORAL | Status: DC

## 2020-05-03 MED ORDER — IPRATROPIUM-ALBUTEROL 0.5-2.5 (3) MG/3ML IN SOLN: 3.0000 mL | Freq: Four times a day (QID) | RESPIRATORY_TRACT | Status: DC

## 2020-05-03 NOTE — H&P (Addendum)
[place]   PATIENT NAME: Bruce [name]    MR#:  [id number]  DATE OF BIRTH:  February 20, 1946  DATE OF ADMISSION:  05/02/2020  PRIMARY CARE PHYSICIAN: Danelle Berry, NP   Patient is coming from: Home REQUESTING/REFERRING PHYSICIAN: Ward, Delice Bison, DO CHIEF COMPLAINT:   Chief Complaint  Patient presents with  . Chest Pain    HISTORY OF PRESENT ILLNESS:  Bruce [name] is a 75 y.o. Caucasian male with medical history significant for COPD, coronary artery disease, GERD, hypertension, peripheral vascular disease and CHF, who presented to the ER with acute onset of chest tightness graded at 4/10 in severity with associated dyspnea and wheezing.  He felt radiation of his chest pain to his left arm and thought he was having a heart attack.  He had fever and chills couple of days ago.  His cough has been productive yellowish sputum.  No nausea or vomiting or abdominal pain.  No dysuria, oliguria or hematuria or flank pain.  He had 2 COVID-19 vaccines. ED Course: When he came to the ER respiratory rate was 24 and pulse oximetry was 89% on room air and later 97% on O2.  Labs revealed unremarkable CMP.  BNP was 46.9 and high-sensitivity troponin I was 10 and later the same.  Procalcitonin was less than 0.1.  CBC was unremarkable.  D-dimer was 0.93.  Influenza antigens and COVID-19 PCR came back negative.  Blood cultures were drawn. EKG as reviewed by me :Showed sinus rhythm with rate of 94 with PACs and borderline repolarization abnormality Imaging: Chest ray showed subtle patchy opacities in both upper lobes suspicious for pneumonia.  Chest CTA showed the following: 1. No definite evidence of pulmonary embolus. 2. 5 mm sub solid nodule is noted laterally in the left upper lobe. Initial follow-up by chest CT without contrast is recommended in 3 months to confirm persistence.  3. Reticular densities are noted in the left upper lobe which may represent scarring or atypical inflammation. 4.  Aortic atherosclerosis.  The patient was given 2 g of IV Rocephin and 500 mg of IV Zithromax, 4 baby aspirin, duo nebs x3 and nebulized albuterol, 1 g IV magnesium sulfate 125 mg of IV Solu-Medrol as well as 0.4 mg of sublingual nitroglycerin.  He will be admitted to an observation medical monitored bed for further evaluation and management.  PAST MEDICAL HISTORY:   Past Medical History:  Diagnosis Date  . Anginal pain ([place])    Left side if chest ,NTG  relieves chaes apin 12/01/13  . Anxiety   . Arthritis   . Cancer [facility])    colon cancer  . CHF (congestive heart failure) ([place])   . 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 Complication of anesthesia    wake up with a head ache  . COPD (chronic obstructive pulmonary disease) ([place])   . Coronary artery disease   . GERD (gastroesophageal reflux disease)   . Headache    related to sinus congestion  . History of blood transfusion   . History of kidney stones   . Hypertension   . Myocardial infarction [facility])    '09 AND '12  . Peripheral vascular disease ([place])   . Shortness of breath    With exertion .  Marland Kitchen UTI (urinary tract infection)    frequent UTI    PAST SURGICAL HISTORY:   Past Surgical History:  Procedure Laterality Date  . CARDIAC CATHETERIZATION    .  CHOLECYSTECTOMY    . EXTERNAL FIXATION LEG Right 10/06/2013   Procedure: CLOSED REDUCTION RIGHT TIBIAL PLATEAU FRACTURE, EXTERNAL FIXATION RIGHT LEG, PLACEMENT OF WOUND VAC;  Surgeon: Rozanna Box, MD;  Location: [place];  Service: Orthopedics;  Laterality: Right;  . FEMORAL-POPLITEAL BYPASS GRAFT Right 10/06/2013   Procedure: RIGHT POPLITEAL-POPLITEAL ARTERY BYPASS GRAFT;  Surgeon: Rosetta Posner, MD;  Location: [place];  Service: Vascular;  Laterality: Right;  . FRACTURE SURGERY Right 2014  . HARDWARE REMOVAL Right 12/02/2013   Procedure: REMOVAL EXTERNAL FIXATION RIGHT LEG ;  Surgeon: Rozanna Box, MD;  Location: [place];  Service:  Orthopedics;  Laterality: Right;  . HERNIA REPAIR Right 1990's  . I & D EXTREMITY Right 10/09/2013   Procedure: IRRIGATION AND DEBRIDEMENT RIGHT LEG, CLOSURE  OF WOUNDS, PLACEMENT OF WOUND VAC ON EACH SIDE OF LEG;  Surgeon: Rozanna Box, MD;  Location: [place];  Service: Orthopedics;  Laterality: Right;  . IR NEPHROSTOMY PLACEMENT LEFT  06/19/2016  . IVC filter  2009   placed @ [facility]/ Removed in 2010.  . IVC Filter Removed    . LEFT HEART CATH AND CORONARY ANGIOGRAPHY Right 10/11/2018   Procedure: LEFT HEART CATH AND CORONARY ANGIOGRAPHY;  Surgeon: [name], MD;  Location: [place] [facility];  Service: Cardiovascular;  Laterality: Right;  . ligament leg Left   . NEPHROLITHOTOMY Left 06/19/2016   Procedure: NEPHROLITHOTOMY PERCUTANEOUS;  Surgeon: Hollice Espy, MD;  Location: [facility];  Service: Urology;  Laterality: Left;  . ORIF TIBIA FRACTURE Right 12/29/2014   Procedure: OPEN REDUCTION INTERNAL FIXATION (ORIF) RIGHT TIBIA FRACTURE, RIA VS ICBG;  Surgeon: Altamese Wynne, MD;  Location: [place];  Service: Orthopedics;  Laterality: Right;  . RADIOACTIVE SEED IMPLANT N/A 09/12/2016   Procedure: RADIOACTIVE SEED IMPLANT/BRACHYTHERAPY IMPLANT;  Surgeon: Hollice Espy, MD;  Location: [facility];  Service: Urology;  Laterality: N/A;    SOCIAL HISTORY:   Social History   Tobacco Use  . Smoking status: Current Some Day Smoker    Packs/day: 0.50    Years: 52.00    Pack years: 26.00  . Smokeless tobacco: Never Used  Substance Use Topics  . Alcohol use: No    Alcohol/week: 0.0 standard drinks    Comment: Stopped 2009    FAMILY HISTORY:   Family History  Problem Relation Age of Onset  . Hypertension Mother   . Prostate cancer Brother   . Mental illness Neg Hx     DRUG ALLERGIES:   Allergies  Allergen Reactions  . Adhesive [Tape] Other (See Comments)    After right leg fracture surgery, pt developed a large blister where tape was applied to his right leg. OK to use paper tape.   . Imdur [Isosorbide Dinitrate] Other (See Comments)    hallucinations  . Singulair [Montelukast Sodium] Other (See Comments)    Hallucinations     REVIEW OF SYSTEMS:   ROS As per history of present illness. All pertinent systems were reviewed above. Constitutional, HEENT, cardiovascular, respiratory, GI, GU, musculoskeletal, neuro, psychiatric, endocrine, integumentary and hematologic systems were reviewed and are otherwise negative/unremarkable except for positive findings mentioned above in the HPI.   MEDICATIONS AT HOME:   Prior to Admission medications   Medication Sig Start Date End Date Taking? Authorizing Provider  acetaminophen (TYLENOL) 325 MG tablet Take 2 tablets (650 mg total) by mouth every 6 (six) hours as needed for mild pain or headache. 10/11/18   Nicholes Mango, MD  albuterol (PROVENTIL HFA;VENTOLIN  HFA) 108 (90 Base) MCG/ACT inhaler Inhale 2 puffs into the lungs every 6 (six) hours as needed for wheezing or shortness of breath. 04/30/18   Pyreddy, Reatha Harps, MD  atorvastatin (LIPITOR) 80 MG tablet Take 1 tablet (80 mg total) by mouth at bedtime. 01/02/19 02/01/19  Kayleen Memos, DO  carvedilol (COREG) 12.5 MG tablet Take 1 tablet (12.5 mg total) by mouth 2 (two) times daily. 01/02/19   Kayleen Memos, DO  cholecalciferol (VITAMIN D) 1000 units tablet Take 1,000 Units by mouth daily.     [provider]  ezetimibe (ZETIA) 10 MG tablet Take 1 tablet (10 mg total) by mouth daily. 01/03/19   Kayleen Memos, DO  HYDROcodone-acetaminophen (NORCO/VICODIN) 5-325 MG tablet Take 1 tablet by mouth daily as needed for moderate pain or severe pain. 01/02/19   Kayleen Memos, DO  loperamide (IMODIUM) 2 MG capsule Take 2 mg by mouth as needed for diarrhea or loose stools.     [provider]  montelukast (SINGULAIR) 10 MG tablet Take 1 tablet (10 mg total) by mouth at bedtime. 01/02/19   Kayleen Memos, DO  Multiple Vitamin (MULTIVITAMIN) tablet Take 1 tablet by mouth daily.  For Men    [provider]  nitroGLYCERIN (NITROSTAT) 0.4 MG SL tablet Place 0.4 mg under the tongue every 5 (five) minutes as needed for chest pain.     [provider]  Omega-3 Fatty Acids (FISH OIL PO) Take 1,000 mg by mouth every evening.     [provider]  omeprazole (PRILOSEC) 40 MG capsule Take 40 mg by mouth 2 (two) times daily.     [provider]  REPATHA SURECLICK 740 MG/ML SOAJ Inject 1 Syringe into the skin every 14 (fourteen) days. 12/10/18   [provider]  rivaroxaban (XARELTO) 20 MG TABS tablet Take 1 tablet (20 mg total) by mouth daily. 01/06/19   Kayleen Memos, DO  rOPINIRole (REQUIP) 1 MG tablet Take 1 mg by mouth at bedtime. 11/04/18   [provider]  tamsulosin (FLOMAX) 0.4 MG CAPS capsule TAKE 1 CAPSULE BY MOUTH ONCE DAILY AFTERSUPPER 06/29/19   Noreene Filbert, MD      VITAL SIGNS:  Blood pressure 115/77, pulse 72, resp. rate 16, height 6\' 1"  (1.854 m), weight 77.1 kg, SpO2 97 %.  PHYSICAL EXAMINATION:  Physical Exam  GENERAL:  75 y.o.-year-old patient lying in the bed with no acute distress.  EYES: Pupils equal, round, reactive to light and accommodation. No scleral icterus. Extraocular muscles intact.  HEENT: Head atraumatic, normocephalic. Oropharynx and nasopharynx clear.  NECK:  Supple, no jugular venous distention. No thyroid enlargement, no tenderness.  LUNGS: Diminished bibasal breath sounds with minimal associated crackles and diffuse expiratory wheezes with diminished expiratory airflow and harsh physical breathing. CARDIOVASCULAR: Regular rate and rhythm, S1, S2 normal. No murmurs, rubs, or gallops.  ABDOMEN: Soft, nondistended, nontender. Bowel sounds present. No organomegaly or mass.  EXTREMITIES: No pedal edema, cyanosis, or clubbing.  NEUROLOGIC: Cranial nerves II through XII are intact. Muscle strength 5/5 in all extremities. Sensation intact. Gait not checked.  PSYCHIATRIC: The patient is alert  and oriented x 3.  Normal affect and good eye contact. SKIN: No obvious rash, lesion, or ulcer.   LABORATORY PANEL:   CBC Recent Labs  Lab 05/02/20 1959  WBC 5.8  HGB 14.3  HCT 42.8  PLT 208   ------------------------------------------------------------------------------------------------------------------  Chemistries  Recent Labs  Lab 05/02/20 1959  NA 141  K 3.9  CL 108  CO2 25  GLUCOSE 121*  BUN 17  CREATININE 1.03  CALCIUM 9.7  AST 17  ALT 18  ALKPHOS 80  BILITOT 0.7   ------------------------------------------------------------------------------------------------------------------  Cardiac Enzymes No results for input(s): TROPONINI in the last 168 hours. ------------------------------------------------------------------------------------------------------------------  RADIOLOGY:  DG Chest 1 View  Result Date: 05/02/2020 CLINICAL DATA:  Acute onset of left-sided chest pain and shortness of breath. EXAM: CHEST  1 VIEW COMPARISON:  10/10/2018 FINDINGS: The heart size and mediastinal contours are within normal limits. Pulmonary hyperinflation again noted. Subtle patchy opacities are seen in the peripheral upper lobes bilaterally, suspicious for pneumonia. No evidence of pneumothorax or pleural effusion. IMPRESSION: Subtle patchy opacities in both upper lobes, suspicious for pneumonia. Recommend continued chest radiographic follow-up to confirm resolution. Electronically Signed   By: Marlaine Hind M.D.   On: 05/02/2020 20:09   CT Angio Chest PE W and/or Wo Contrast  Result Date: 05/02/2020 CLINICAL DATA:  Shortness of breath, chest pain. EXAM: CT ANGIOGRAPHY CHEST WITH CONTRAST TECHNIQUE: Multidetector CT imaging of the chest was performed using the standard protocol during bolus administration of intravenous contrast. Multiplanar CT image reconstructions and MIPs were obtained to evaluate the vascular anatomy. CONTRAST:  48mL OMNIPAQUE IOHEXOL 350 MG/ML SOLN COMPARISON:   None. FINDINGS: Cardiovascular: Satisfactory opacification of the pulmonary arteries to the segmental level. No evidence of pulmonary embolism. Normal heart size. No pericardial effusion. Atherosclerosis of thoracic aorta is noted without aneurysm or dissection. Mediastinum/Nodes: No enlarged mediastinal, hilar, or axillary lymph nodes. Thyroid gland, trachea, and esophagus demonstrate no significant findings. Lungs/Pleura: No pneumothorax or pleural effusion is noted. Minimal bibasilar subsegmental atelectasis is noted. 5 mm sub solid nodule is noted laterally in the left upper lobe best seen on image number 61 of series 6. Reticular densities are noted in the left upper lobe which may represent scarring or atypical inflammation. Upper Abdomen: No acute abnormality. Musculoskeletal: No chest wall abnormality. No acute or significant osseous findings. Review of the MIP images confirms the above findings. IMPRESSION: 1. No definite evidence of pulmonary embolus. 2. 5 mm sub solid nodule is noted laterally in the left upper lobe. Initial follow-up by chest CT without contrast is recommended in 3 months to confirm persistence. This recommendation follows the consensus statement: Recommendations for the Management of Subsolid Pulmonary Nodules Detected at CT: A Statement from the [employer] as published in Radiology 2013; 266:304-317. 3. Reticular densities are noted in the left upper lobe which may represent scarring or atypical inflammation. 4. Aortic atherosclerosis. Aortic Atherosclerosis (ICD10-I70.0). Electronically Signed   By: Marijo Conception M.D.   On: 05/02/2020 23:34      IMPRESSION AND PLAN:  Active Problems:   COPD exacerbation ([place])  1.  COPD exacerbation secondary to lower respiratory infection and possibly community-acquired pneumonia. -The patient will be admitted to a medical monitored bed. -We will continue antibiotic therapy with IV Rocephin and Zithromax. -We will follow blood  culture as well as sputum culture. -Mucolytic therapy and bronchodilator therapy will be provided. -The patient will be continued on IV steroid therapy with Solu-Medrol. -We will follow pneumonia antigens.  2.  Chest pain/tightness. -We will follow serial troponin I's.  This is likely secondary to COPD exacerbation. -Cardiology consult will be obtained. -I notified Dr. Humphrey Rolls about the patient.  3.  Essential hypertension. -We will continue Coreg and Cozaar.  4.  Dyslipidemia. -We will continue statin therapy and check lipid profile.  5.  BPH. -We will continue his Flomax.  6.  Restless leg syndrome. -We will continue Requip.  7.  History of DVT. -We will continue Xarelto  DVT prophylaxis: Continue Xarelto. Code Status: full code. Family Communication:  The plan of care was discussed in details with the patient (and family). I answered all questions. The patient agreed to proceed with the above mentioned plan. Further management will depend upon hospital course. Disposition Plan: Back to previous home environment Consults called: Cardiology consult to Dr. Humphrey Rolls. All the records are reviewed and case discussed with ED provider.  Status is: Observation  The patient remains OBS appropriate and will d/c before 2 midnights.  Dispo: The patient is from: Home              Anticipated d/c is to: Home              Patient currently is not medically stable to d/c.   Difficult to place patient No   TOTAL TIME TAKING CARE OF THIS PATIENT: 55 minutes.    Christel Mormon M.D on 05/03/2020 at 12:38 AM  [facility]   From 7 PM-7 AM, contact night-coverage [email or web address]  CC: Primary care physician; Danelle Berry, NP

## 2020-05-03 NOTE — Discharge Instructions (Signed)
Smoking cessation advised.

## 2020-05-03 NOTE — Consult Note (Signed)
Bruce [name] is a 75 y.o. male  [id number]  Primary Cardiologist: Neoma Laming Reason for Consultation: Chest Pain  HPI: Patient is a 75 year old male with a PMH of CAD, HTN, CHF, COPD, and GERD presenting to the emergency department with chest pain. Patient states the chest pain began while he was watching TV, he describes it as a tightness in his left chest that radiated to his left shoulder and down his left arm. Troponin was 10. Chest CTA negative for PE, but Chest X ray with evidence of PNA. We have been consulted for evaluation of chest pain.   Review of Systems: Currently denies chest pain, dyspnea, orthopnea, or PND.   Past Medical History:  Diagnosis Date  . Anginal pain ([place])    Left side if chest ,NTG  relieves chaes apin 12/01/13  . Anxiety   . Arthritis   . Cancer [facility])    colon cancer  . CHF (congestive heart failure) ([place])   . 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 Complication of anesthesia    wake up with a head ache  . COPD (chronic obstructive pulmonary disease) ([place])   . Coronary artery disease   . GERD (gastroesophageal reflux disease)   . Headache    related to sinus congestion  . History of blood transfusion   . History of kidney stones   . Hypertension   . Myocardial infarction [facility])    '09 AND '12  . Peripheral vascular disease ([place])   . Shortness of breath    With exertion .  Marland Kitchen UTI (urinary tract infection)    frequent UTI    Medications Prior to Admission  Medication Sig Dispense Refill  . acetaminophen (TYLENOL) 325 MG tablet Take 2 tablets (650 mg total) by mouth every 6 (six) hours as needed for mild pain or headache.    . albuterol (PROVENTIL HFA;VENTOLIN HFA) 108 (90 Base) MCG/ACT inhaler Inhale 2 puffs into the lungs every 6 (six) hours as needed for wheezing or shortness of breath. 1 Inhaler 2  . ALPRAZolam (XANAX) 0.5 MG tablet Take 0.5 mg by mouth 2 (two) times daily as needed.    Marland Kitchen  atorvastatin (LIPITOR) 80 MG tablet Take 1 tablet (80 mg total) by mouth at bedtime. 30 tablet 0  . carvedilol (COREG) 12.5 MG tablet Take 1 tablet (12.5 mg total) by mouth 2 (two) times daily. 60 tablet 0  . cholecalciferol (VITAMIN D) 1000 units tablet Take 1,000 Units by mouth daily.     . fluticasone (FLONASE) 50 MCG/ACT nasal spray Place 1 spray into both nostrils.    Marland Kitchen losartan (COZAAR) 50 MG tablet Take 50 mg by mouth daily.    . Multiple Vitamin (MULTIVITAMIN) tablet Take 1 tablet by mouth daily. For Men    . nitroGLYCERIN (NITROSTAT) 0.4 MG SL tablet Place 0.4 mg under the tongue every 5 (five) minutes as needed for chest pain.     . Omega-3 Fatty Acids (FISH OIL PO) Take 1,000 mg by mouth every evening.     Marland Kitchen omeprazole (PRILOSEC) 40 MG capsule Take 40 mg by mouth 2 (two) times daily.     Marland Kitchen oxyCODONE-acetaminophen (PERCOCET/ROXICET) 5-325 MG tablet Take 1 tablet by mouth 2 (two) times daily as needed.    . rivaroxaban (XARELTO) 20 MG TABS tablet Take 1 tablet (20 mg total) by mouth daily. 30 tablet 0  . rOPINIRole (REQUIP) 1 MG tablet Take 1 mg  by mouth at bedtime.    . tamsulosin (FLOMAX) 0.4 MG CAPS capsule TAKE 1 CAPSULE BY MOUTH ONCE DAILY AFTERSUPPER 30 capsule 11  . ezetimibe (ZETIA) 10 MG tablet Take 1 tablet (10 mg total) by mouth daily. (Patient not taking: No sig reported) 30 tablet 0  . HYDROcodone-acetaminophen (NORCO/VICODIN) 5-325 MG tablet Take 1 tablet by mouth daily as needed for moderate pain or severe pain. (Patient not taking: No sig reported) 5 tablet 0  . loperamide (IMODIUM) 2 MG capsule Take 2 mg by mouth as needed for diarrhea or loose stools.  (Patient not taking: No sig reported)    . montelukast (SINGULAIR) 10 MG tablet Take 1 tablet (10 mg total) by mouth at bedtime. (Patient not taking: No sig reported) 30 tablet 0  . REPATHA SURECLICK 062 MG/ML SOAJ Inject 1 Syringe into the skin every 14 (fourteen) days. (Patient not taking: No sig reported)       .  atorvastatin  80 mg Oral QHS  . carvedilol  12.5 mg Oral BID  . cholecalciferol  1,000 Units Oral Daily  . dextromethorphan-guaiFENesin  1 tablet Oral BID  . fluticasone  1 spray Each Nare Daily  . guaiFENesin  600 mg Oral BID  . ipratropium-albuterol  3 mL Nebulization QID  . losartan  50 mg Oral Daily  . methylPREDNISolone (SOLU-MEDROL) injection  40 mg Intravenous Q6H   Followed by  . [START ON 05/04/2020] predniSONE  40 mg Oral Q breakfast  . multivitamin with minerals  1 tablet Oral Daily  . omega-3 acid ethyl esters  1 g Oral Daily  . pantoprazole  40 mg Oral Daily  . rivaroxaban  20 mg Oral Daily  . rOPINIRole  1 mg Oral QHS  . tamsulosin  0.4 mg Oral Daily    Infusions:   Allergies  Allergen Reactions  . Adhesive [Tape] Other (See Comments)    After right leg fracture surgery, pt developed a large blister where tape was applied to his right leg. OK to use paper tape.  . Imdur [Isosorbide Dinitrate] Other (See Comments)    hallucinations  . Singulair [Montelukast Sodium] Other (See Comments)    Hallucinations     Social History   Socioeconomic History  . Marital status: Single    Spouse name: Not on file  . Number of children: 2  . Years of education: Not on file  . Highest education level: 6th grade  Occupational History  . Not on file  Tobacco Use  . Smoking status: Current Some Day Smoker    Packs/day: 0.50    Years: 52.00    Pack years: 26.00  . Smokeless tobacco: Never Used  Vaping Use  . Vaping Use: Never used  Substance and Sexual Activity  . Alcohol use: No    Alcohol/week: 0.0 standard drinks    Comment: Stopped 2009  . Drug use: No  . Sexual activity: Not Currently  Other Topics Concern  . Not on file  Social History Narrative   ** Merged History Encounter **       Social Determinants of Health   Financial Resource Strain: Not on file  Food Insecurity: Not on file  Transportation Needs: Not on file  Physical Activity: Not on file   Stress: Not on file  Social Connections: Not on file  Intimate Partner Violence: Not on file    Family History  Problem Relation Age of Onset  . Hypertension Mother   . Prostate cancer Brother   .  Mental illness Neg Hx     PHYSICAL EXAM: Vitals:   05/03/20 0719 05/03/20 0858  BP:  133/73  Pulse:  76  Resp:  18  Temp:  97.7 F (36.5 C)  SpO2: 98% 95%     Intake/Output Summary (Last 24 hours) at 05/03/2020 0941 Last data filed at 05/03/2020 0900 Gross per 24 hour  Intake 416.08 ml  Output 200 ml  Net 216.08 ml    General:  Well appearing. No respiratory difficulty HEENT: normal Neck: supple. no JVD. Carotids 2+ bilat; no bruits. No lymphadenopathy or thryomegaly appreciated. Cor: PMI nondisplaced. Regular rate & rhythm. No rubs, gallops or murmurs. Lungs: diminished Abdomen: soft, nontender, nondistended. No hepatosplenomegaly. No bruits or masses. Good bowel sounds. Extremities: no cyanosis, clubbing, rash, edema Neuro: alert & oriented x 3, cranial nerves grossly intact. moves all 4 extremities w/o difficulty. Affect pleasant.  ECG: NSR with PAC. 94/bpm.  Results for orders placed or performed during the hospital encounter of 05/02/20 (from the past 24 hour(s))  Troponin I (High Sensitivity)     Status: None   Collection Time: 05/02/20  7:59 PM  Result Value Ref Range   Troponin I (High Sensitivity) 10 <18 ng/L  Comprehensive metabolic panel     Status: Abnormal   Collection Time: 05/02/20  7:59 PM  Result Value Ref Range   Sodium 141 135 - 145 mmol/L   Potassium 3.9 3.5 - 5.1 mmol/L   Chloride 108 98 - 111 mmol/L   CO2 25 22 - 32 mmol/L   Glucose, Bld 121 (H) 70 - 99 mg/dL   BUN 17 8 - 23 mg/dL   Creatinine, Ser 1.03 0.61 - 1.24 mg/dL   Calcium 9.7 8.9 - 10.3 mg/dL   Total Protein 7.4 6.5 - 8.1 g/dL   Albumin 4.3 3.5 - 5.0 g/dL   AST 17 15 - 41 U/L   ALT 18 0 - 44 U/L   Alkaline Phosphatase 80 38 - 126 U/L   Total Bilirubin 0.7 0.3 - 1.2 mg/dL   GFR,  Estimated >60 >60 mL/min   Anion gap 8 5 - 15  CBC with Differential     Status: None   Collection Time: 05/02/20  7:59 PM  Result Value Ref Range   WBC 5.8 4.0 - 10.5 K/uL   RBC 4.45 4.22 - 5.81 MIL/uL   Hemoglobin 14.3 13.0 - 17.0 g/dL   HCT 42.8 39.0 - 52.0 %   MCV 96.2 80.0 - 100.0 fL   MCH 32.1 26.0 - 34.0 pg   MCHC 33.4 30.0 - 36.0 g/dL   RDW 13.3 11.5 - 15.5 %   Platelets 208 150 - 400 K/uL   nRBC 0.0 0.0 - 0.2 %   Neutrophils Relative % 68 %   Neutro Abs 3.9 1.7 - 7.7 K/uL   Lymphocytes Relative 23 %   Lymphs Abs 1.3 0.7 - 4.0 K/uL   Monocytes Relative 6 %   Monocytes Absolute 0.3 0.1 - 1.0 K/uL   Eosinophils Relative 3 %   Eosinophils Absolute 0.2 0.0 - 0.5 K/uL   Basophils Relative 0 %   Basophils Absolute 0.0 0.0 - 0.1 K/uL   Immature Granulocytes 0 %   Abs Immature Granulocytes 0.01 0.00 - 0.07 K/uL  Brain natriuretic peptide     Status: None   Collection Time: 05/02/20  7:59 PM  Result Value Ref Range   B Natriuretic Peptide 46.9 0.0 - 100.0 pg/mL  Protime-INR     Status:  None   Collection Time: 05/02/20  7:59 PM  Result Value Ref Range   Prothrombin Time 13.1 11.4 - 15.2 seconds   INR 1.0 0.8 - 1.2  D-dimer, quantitative     Status: Abnormal   Collection Time: 05/02/20  7:59 PM  Result Value Ref Range   D-Dimer, Quant 0.93 (H) 0.00 - 0.50 ug/mL-FEU  Procalcitonin - Baseline     Status: None   Collection Time: 05/02/20  7:59 PM  Result Value Ref Range   Procalcitonin <0.10 ng/mL  Resp Panel by RT-PCR (Flu A&B, Covid) Nasopharyngeal Swab     Status: None   Collection Time: 05/02/20  8:56 PM   Specimen: Nasopharyngeal Swab; Nasopharyngeal(NP) swabs in vial transport medium  Result Value Ref Range   SARS Coronavirus 2 by RT PCR NEGATIVE NEGATIVE   Influenza A by PCR NEGATIVE NEGATIVE   Influenza B by PCR NEGATIVE NEGATIVE  Culture, blood (Routine X 2) w Reflex to ID Panel     Status: None (Preliminary result)   Collection Time: 05/02/20  8:56 PM    Specimen: BLOOD  Result Value Ref Range   Specimen Description BLOOD RIGHT ANTECUBITAL    Special Requests      BOTTLES DRAWN AEROBIC AND ANAEROBIC Blood Culture adequate volume   Culture      NO GROWTH < 12 HOURS Performed at [facility], [address] [place], [place] [address]    Report Status PENDING   Culture, blood (Routine X 2) w Reflex to ID Panel     Status: None (Preliminary result)   Collection Time: 05/02/20  8:56 PM   Specimen: BLOOD  Result Value Ref Range   Specimen Description BLOOD LEFT ANTECUBITAL    Special Requests      BOTTLES DRAWN AEROBIC AND ANAEROBIC Blood Culture adequate volume   Culture      NO GROWTH < 12 HOURS Performed at [facility], [address] [place], [place] [address]    Report Status PENDING   Troponin I (High Sensitivity)     Status: None   Collection Time: 05/02/20 10:49 PM  Result Value Ref Range   Troponin I (High Sensitivity) 10 <18 ng/L  Strep pneumoniae urinary antigen     Status: None   Collection Time: 05/03/20  1:22 AM  Result Value Ref Range   Strep Pneumo Urinary Antigen NEGATIVE NEGATIVE  Basic metabolic panel     Status: Abnormal   Collection Time: 05/03/20  4:01 AM  Result Value Ref Range   Sodium 140 135 - 145 mmol/L   Potassium 4.1 3.5 - 5.1 mmol/L   Chloride 110 98 - 111 mmol/L   CO2 22 22 - 32 mmol/L   Glucose, Bld 180 (H) 70 - 99 mg/dL   BUN 18 8 - 23 mg/dL   Creatinine, Ser 0.86 0.61 - 1.24 mg/dL   Calcium 9.0 8.9 - 10.3 mg/dL   GFR, Estimated >60 >60 mL/min   Anion gap 8 5 - 15  CBC     Status: Abnormal   Collection Time: 05/03/20  4:01 AM  Result Value Ref Range   WBC 4.8 4.0 - 10.5 K/uL   RBC 3.80 (L) 4.22 - 5.81 MIL/uL   Hemoglobin 12.2 (L) 13.0 - 17.0 g/dL   HCT 36.3 (L) 39.0 - 52.0 %   MCV 95.5 80.0 - 100.0 fL   MCH 32.1 26.0 - 34.0 pg   MCHC 33.6 30.0 - 36.0 g/dL   RDW 13.3 11.5 -  15.5 %   Platelets 179 150 - 400 K/uL   nRBC 0.0 0.0 - 0.2 %   DG Chest 1 View  Result  Date: 05/02/2020 CLINICAL DATA:  Acute onset of left-sided chest pain and shortness of breath. EXAM: CHEST  1 VIEW COMPARISON:  10/10/2018 FINDINGS: The heart size and mediastinal contours are within normal limits. Pulmonary hyperinflation again noted. Subtle patchy opacities are seen in the peripheral upper lobes bilaterally, suspicious for pneumonia. No evidence of pneumothorax or pleural effusion. IMPRESSION: Subtle patchy opacities in both upper lobes, suspicious for pneumonia. Recommend continued chest radiographic follow-up to confirm resolution. Electronically Signed   By: Marlaine Hind M.D.   On: 05/02/2020 20:09   CT Angio Chest PE W and/or Wo Contrast  Result Date: 05/02/2020 CLINICAL DATA:  Shortness of breath, chest pain. EXAM: CT ANGIOGRAPHY CHEST WITH CONTRAST TECHNIQUE: Multidetector CT imaging of the chest was performed using the standard protocol during bolus administration of intravenous contrast. Multiplanar CT image reconstructions and MIPs were obtained to evaluate the vascular anatomy. CONTRAST:  57mL OMNIPAQUE IOHEXOL 350 MG/ML SOLN COMPARISON:  None. FINDINGS: Cardiovascular: Satisfactory opacification of the pulmonary arteries to the segmental level. No evidence of pulmonary embolism. Normal heart size. No pericardial effusion. Atherosclerosis of thoracic aorta is noted without aneurysm or dissection. Mediastinum/Nodes: No enlarged mediastinal, hilar, or axillary lymph nodes. Thyroid gland, trachea, and esophagus demonstrate no significant findings. Lungs/Pleura: No pneumothorax or pleural effusion is noted. Minimal bibasilar subsegmental atelectasis is noted. 5 mm sub solid nodule is noted laterally in the left upper lobe best seen on image number 61 of series 6. Reticular densities are noted in the left upper lobe which may represent scarring or atypical inflammation. Upper Abdomen: No acute abnormality. Musculoskeletal: No chest wall abnormality. No acute or significant osseous  findings. Review of the MIP images confirms the above findings. IMPRESSION: 1. No definite evidence of pulmonary embolus. 2. 5 mm sub solid nodule is noted laterally in the left upper lobe. Initial follow-up by chest CT without contrast is recommended in 3 months to confirm persistence. This recommendation follows the consensus statement: Recommendations for the Management of Subsolid Pulmonary Nodules Detected at CT: A Statement from the [employer] as published in Radiology 2013; 266:304-317. 3. Reticular densities are noted in the left upper lobe which may represent scarring or atypical inflammation. 4. Aortic atherosclerosis. Aortic Atherosclerosis (ICD10-I70.0). Electronically Signed   By: Marijo Conception M.D.   On: 05/02/2020 23:34     ASSESSMENT AND PLAN: Patient presenting to the emergency department with complaints of chest pain. Chest pain most likely d/t COPD exacerbation / PNA as troponin was negative and no signs of ischemia on EKG. From a cardiac point of view patient is stable for discharge and he will have a follow up at our clinic within the next 2 weeks.  Adaline Sill NP-C

## 2020-05-03 NOTE — Discharge Summary (Signed)
[place] [facility] [place] NAME: Bruce [name]    MR#:  [id number]  DATE OF BIRTH:  July 04, 1945  DATE OF ADMISSION:  05/02/2020 ADMITTING PHYSICIAN: Bruce Mormon, MD  DATE OF DISCHARGE: 228/2022  PRIMARY CARE PHYSICIAN: Bruce Berry, NP    ADMISSION DIAGNOSIS:  COPD exacerbation ([place]) [J44.1] COPD with acute exacerbation ([place]) [J44.1] Chest pain, moderate coronary artery risk [R07.9]  DISCHARGE DIAGNOSIS:  COPD exacerbation Chest pain,appears atypical with COPD  SECONDARY DIAGNOSIS:   Past Medical History:  Diagnosis Date  . Anginal pain ([place])    Left side if chest ,NTG  relieves chaes apin 12/01/13  . Anxiety   . Arthritis   . Cancer [facility])    colon cancer  . CHF (congestive heart failure) ([place])   . 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 Complication of anesthesia    wake up with a head ache  . COPD (chronic obstructive pulmonary disease) ([place])   . Coronary artery disease   . GERD (gastroesophageal reflux disease)   . Headache    related to sinus congestion  . History of blood transfusion   . History of kidney stones   . Hypertension   . Myocardial infarction [facility])    '09 AND '12  . Peripheral vascular disease ([place])   . Shortness of breath    With exertion .  [place] Kitchen UTI (urinary tract infection)    frequent UTI    HOSPITAL COURSE:  Bruce [name] is a 75 y.o. Caucasian male with medical history significant for COPD, coronary artery disease, GERD, hypertension, peripheral vascular disease and CHF, who presented to the ER with acute onset of chest tightness graded at 4/10 in severity with associated dyspnea and wheezing.  COPD exacerbation with chest tightness -- symptoms improved -- change to PO steroids, inhalers -- advised on smoking cessation -- oxygen sats >92 ON RA  Chest pain appears atypical with shortness of breath-- seen by Bruce Register PA cardiology Dr. Yancey [name] --  cardiac enzyme times two negative -- continue sublingual PRN Nitro and other cardiac meds -- patient will follow-up with cardiology as outpatient -- EKG no acute ST-T changes  Hypertension --continue home meds  hyperlipidemia  --continue statins and omega-3 fatty acid  Tobacco abuse --advised smoking cessation  Overall hemodynamically stable. Discharge to home. Patient agreeable CONSULTS OBTAINED:    DRUG ALLERGIES:   Allergies  Allergen Reactions  . Adhesive [Tape] Other (See Comments)    After right leg fracture surgery, pt developed a large blister where tape was applied to his right leg. OK to use paper tape.  . Imdur [Isosorbide Dinitrate] Other (See Comments)    hallucinations  . Singulair [Montelukast Sodium] Other (See Comments)    Hallucinations     DISCHARGE MEDICATIONS:   Allergies as of 05/03/2020      Reactions   Adhesive [tape] Other (See Comments)   After right leg fracture surgery, pt developed a large blister where tape was applied to his right leg. OK to use paper tape.   Imdur [isosorbide Dinitrate] Other (See Comments)   hallucinations   Singulair [montelukast Sodium] Other (See Comments)   Hallucinations      Medication List    STOP taking these medications   ezetimibe 10 MG tablet Commonly known as: ZETIA   HYDROcodone-acetaminophen 5-325 MG tablet Commonly known as: NORCO/VICODIN   loperamide 2 MG capsule Commonly known as: IMODIUM   montelukast  10 MG tablet Commonly known as: SINGULAIR     TAKE these medications   acetaminophen 325 MG tablet Commonly known as: TYLENOL Take 2 tablets (650 mg total) by mouth every 6 (six) hours as needed for mild pain or headache.   albuterol 108 (90 Base) MCG/ACT inhaler Commonly known as: VENTOLIN HFA Inhale 2 puffs into the lungs every 6 (six) hours as needed for wheezing or shortness of breath.   ALPRAZolam 0.5 MG tablet Commonly known as: XANAX Take 0.5 mg by mouth 2 (two) times daily  as needed.   atorvastatin 80 MG tablet Commonly known as: LIPITOR Take 1 tablet (80 mg total) by mouth at bedtime.   carvedilol 12.5 MG tablet Commonly known as: COREG Take 1 tablet (12.5 mg total) by mouth 2 (two) times daily.   cholecalciferol 25 MCG (1000 UNIT) tablet Commonly known as: VITAMIN D Take 1,000 Units by mouth daily.   FISH OIL PO Take 1,000 mg by mouth every evening.   fluticasone 50 MCG/ACT nasal spray Commonly known as: FLONASE Place 1 spray into both nostrils.   losartan 50 MG tablet Commonly known as: COZAAR Take 50 mg by mouth daily.   multivitamin tablet Take 1 tablet by mouth daily. For Men   nitroGLYCERIN 0.4 MG SL tablet Commonly known as: NITROSTAT Place 0.4 mg under the tongue every 5 (five) minutes as needed for chest pain.   omeprazole 40 MG capsule Commonly known as: PRILOSEC Take 40 mg by mouth 2 (two) times daily.   oxyCODONE-acetaminophen 5-325 MG tablet Commonly known as: PERCOCET/ROXICET Take 1 tablet by mouth 2 (two) times daily as needed.   predniSONE 20 MG tablet Commonly known as: DELTASONE Take 2 tablets (40 mg total) by mouth daily with breakfast for 4 days. Start taking on: May 04, 2421   Repatha SureClick 536 MG/ML So[place] Generic drug: Evolocumab Inject 1 Syringe into the skin every 14 (fourteen) days.   rivaroxaban 20 MG Tabs tablet Commonly known as: XARELTO Take 1 tablet (20 mg total) by mouth daily.   rOPINIRole 1 MG tablet Commonly known as: REQUIP Take 1 mg by mouth at bedtime.   tamsulosin 0.4 MG Caps capsule Commonly known as: FLOMAX TAKE 1 CAPSULE BY MOUTH ONCE DAILY AFTERSUPPER       If you experience worsening of your admission symptoms, develop shortness of breath, life threatening emergency, suicidal or homicidal thoughts you must seek medical attention immediately by calling 911 or calling your MD immediately  if symptoms less severe.  You Must read complete instructions/literature along with all  the possible adverse reactions/side effects for all the Medicines you take and that have been prescribed to you. Take any new Medicines after you have completely understood and accept all the possible adverse reactions/side effects.   Please note  You were cared for by a hospitalist during your hospital stay. If you have any questions about your discharge medications or the care you received while you were in the hospital after you are discharged, you can call the unit and asked to speak with the hospitalist on call if the hospitalist that took care of you is not available. Once you are discharged, your primary care physician will handle any further medical issues. Please note that NO REFILLS for any discharge medications will be authorized once you are discharged, as it is imperative that you return to your primary care physician (or establish a relationship with a primary care physician if you do not have one) for your aftercare  needs so that they can reassess your need for medications and monitor your lab values. Today   SUBJECTIVE    I'm doing okay. No new issues per RN VITAL SIGNS:  Blood pressure 133/73, pulse 76, temperature 97.7 F (36.5 C), temperature source Oral, resp. rate 18, height 6\' 1"  (1.854 m), weight 75.3 kg, SpO2 95 %.  I/O:    Intake/Output Summary (Last 24 hours) at 05/03/2020 0904 Last data filed at 05/03/2020 0900 Gross per 24 hour  Intake 416.08 ml  Output 200 ml  Net 216.08 ml    PHYSICAL EXAMINATION:  GENERAL:  75 y.o.-year-old patient lying in the bed with no acute distress.  LUNGS: Normal breath sounds bilaterally, no wheezing, rales,rhonchi or crepitation. No use of accessory muscles of respiration.  CARDIOVASCULAR: S1, S2 normal. No murmurs, rubs, or gallops.  ABDOMEN: Soft, non-tender, non-distended. Bowel sounds present. No organomegaly or mass.  EXTREMITIES: No pedal edema, cyanosis, or clubbing.  NEUROLOGIC: Cranial nerves II through XII are intact.  Muscle strength 5/5 in all extremities. Sensation intact. Gait not checked.  PSYCHIATRIC: The patient is alert and oriented x 3.  SKIN: No obvious rash, lesion, or ulcer.   DATA REVIEW:   CBC  Recent Labs  Lab 05/03/20 0401  WBC 4.8  HGB 12.2*  HCT 36.3*  PLT 179    Chemistries  Recent Labs  Lab 05/02/20 1959 05/03/20 0401  NA 141 140  K 3.9 4.1  CL 108 110  CO2 25 22  GLUCOSE 121* 180*  BUN 17 18  CREATININE 1.03 0.86  CALCIUM 9.7 9.0  AST 17  --   ALT 18  --   ALKPHOS 80  --   BILITOT 0.7  --     Microbiology Results   Recent Results (from the past 240 hour(s))  Resp Panel by RT-PCR (Flu A&B, Covid) Nasopharyngeal Swab     Status: None   Collection Time: 05/02/20  8:56 PM   Specimen: Nasopharyngeal Swab; Nasopharyngeal(NP) swabs in vial transport medium  Result Value Ref Range Status   SARS Coronavirus 2 by RT PCR NEGATIVE NEGATIVE Final    Comment: (NOTE) SARS-CoV-2 target nucleic acids are NOT DETECTED.  The SARS-CoV-2 RNA is generally detectable in upper respiratory specimens during the acute phase of infection. The lowest concentration of SARS-CoV-2 viral copies this assay can detect is 138 copies/mL. A negative result does not preclude SARS-Cov-2 infection and should not be used as the sole basis for treatment or other patient management decisions. A negative result may occur with  improper specimen collection/handling, submission of specimen other than nasopharyngeal swab, presence of viral mutation(s) within the areas targeted by this assay, and inadequate number of viral copies(<138 copies/mL). A negative result must be combined with clinical observations, patient history, and epidemiological information. The expected result is Negative.  Fact Sheet for Patients:  EntrepreneurPulse.com.au  Fact Sheet for Healthcare Providers:  IncredibleEmployment.be  This test is no t yet approved or cleared by the [place] FDA and  has been authorized for detection and/or diagnosis of SARS-CoV-2 by FDA under an Emergency Use Authorization (EUA). This EUA will remain  in effect (meaning this test can be used) for the duration of the COVID-19 declaration under Section 564(b)(1) of the Act, 21 U.S.C.section 360bbb-3(b)(1), unless the authorization is terminated  or revoked sooner.       Influenza A by PCR NEGATIVE NEGATIVE Final   Influenza B by PCR NEGATIVE NEGATIVE Final    Comment: (NOTE) The Xpert  Xpress SARS-CoV-2/FLU/RSV plus assay is intended as an aid in the diagnosis of influenza from Nasopharyngeal swab specimens and should not be used as a sole basis for treatment. Nasal washings and aspirates are unacceptable for Xpert Xpress SARS-CoV-2/FLU/RSV testing.  Fact Sheet for Patients: EntrepreneurPulse.com.au  Fact Sheet for Healthcare Providers: IncredibleEmployment.be  This test is not yet approved or cleared by the [place] FDA and has been authorized for detection and/or diagnosis of SARS-CoV-2 by FDA under an Emergency Use Authorization (EUA). This EUA will remain in effect (meaning this test can be used) for the duration of the COVID-19 declaration under Section 564(b)(1) of the Act, 21 U.S.C. section 360bbb-3(b)(1), unless the authorization is terminated or revoked.  Performed at [facility], [address] [place], [place] [address]   Culture, blood (Routine X 2) w Reflex to ID Panel     Status: None (Preliminary result)   Collection Time: 05/02/20  8:56 PM   Specimen: BLOOD  Result Value Ref Range Status   Specimen Description BLOOD RIGHT ANTECUBITAL  Final   Special Requests   Final    BOTTLES DRAWN AEROBIC AND ANAEROBIC Blood Culture adequate volume   Culture   Final    NO GROWTH < 12 HOURS Performed at [facility], [address], [place], [place] [address]    Report Status PENDING  Incomplete  Culture,  blood (Routine X 2) w Reflex to ID Panel     Status: None (Preliminary result)   Collection Time: 05/02/20  8:56 PM   Specimen: BLOOD  Result Value Ref Range Status   Specimen Description BLOOD LEFT ANTECUBITAL  Final   Special Requests   Final    BOTTLES DRAWN AEROBIC AND ANAEROBIC Blood Culture adequate volume   Culture   Final    NO GROWTH < 12 HOURS Performed at [facility], [address], [place], [place] [address]    Report Status PENDING  Incomplete    RADIOLOGY:  DG Chest 1 View  Result Date: 05/02/2020 CLINICAL DATA:  Acute onset of left-sided chest pain and shortness of breath. EXAM: CHEST  1 VIEW COMPARISON:  10/10/2018 FINDINGS: The heart size and mediastinal contours are within normal limits. Pulmonary hyperinflation again noted. Subtle patchy opacities are seen in the peripheral upper lobes bilaterally, suspicious for pneumonia. No evidence of pneumothorax or pleural effusion. IMPRESSION: Subtle patchy opacities in both upper lobes, suspicious for pneumonia. Recommend continued chest radiographic follow-up to confirm resolution. Electronically Signed   By: Marlaine Hind M.D.   On: 05/02/2020 20:09   CT Angio Chest PE W and/or Wo Contrast  Result Date: 05/02/2020 CLINICAL DATA:  Shortness of breath, chest pain. EXAM: CT ANGIOGRAPHY CHEST WITH CONTRAST TECHNIQUE: Multidetector CT imaging of the chest was performed using the standard protocol during bolus administration of intravenous contrast. Multiplanar CT image reconstructions and MIPs were obtained to evaluate the vascular anatomy. CONTRAST:  65mL OMNIPAQUE IOHEXOL 350 MG/ML SOLN COMPARISON:  None. FINDINGS: Cardiovascular: Satisfactory opacification of the pulmonary arteries to the segmental level. No evidence of pulmonary embolism. Normal heart size. No pericardial effusion. Atherosclerosis of thoracic aorta is noted without aneurysm or dissection. Mediastinum/Nodes: No enlarged mediastinal, hilar, or axillary lymph  nodes. Thyroid gland, trachea, and esophagus demonstrate no significant findings. Lungs/Pleura: No pneumothorax or pleural effusion is noted. Minimal bibasilar subsegmental atelectasis is noted. 5 mm sub solid nodule is noted laterally in the left upper lobe best seen on image number 61 of series 6. Reticular densities are noted  in the left upper lobe which may represent scarring or atypical inflammation. Upper Abdomen: No acute abnormality. Musculoskeletal: No chest wall abnormality. No acute or significant osseous findings. Review of the MIP images confirms the above findings. IMPRESSION: 1. No definite evidence of pulmonary embolus. 2. 5 mm sub solid nodule is noted laterally in the left upper lobe. Initial follow-up by chest CT without contrast is recommended in 3 months to confirm persistence. This recommendation follows the consensus statement: Recommendations for the Management of Subsolid Pulmonary Nodules Detected at CT: A Statement from the [place] as published in Radiology 2013; 266:304-317. 3. Reticular densities are noted in the left upper lobe which may represent scarring or atypical inflammation. 4. Aortic atherosclerosis. Aortic Atherosclerosis (ICD10-I70.0). Electronically Signed   By: Marijo Conception M.D.   On: 05/02/2020 23:34     CODE STATUS:     Code Status Orders  (From admission, onward)         Start     Ordered   05/03/20 0032  Do not attempt resuscitation (DNR)  Continuous       Question Answer Comment  In the event of cardiac or respiratory ARREST Do not call a "code blue"   In the event of cardiac or respiratory ARREST Do not perform Intubation, CPR, defibrillation or ACLS   In the event of cardiac or respiratory ARREST Use medication by any route, position, wound care, and other measures to relive pain and suffering. May use oxygen, suction and manual treatment of airway obstruction as needed for comfort.      05/03/20 0035        Code Status History     Date Active Date Inactive Code Status Order ID Comments User Context   12/31/2018 0518 01/02/2019 1805 DNR [id number]  Rise Patience, MD ED   10/10/2018 2116 10/11/2018 2136 DNR [id number]  Vaughan Basta, MD Inpatient   04/28/2018 2257 04/30/2018 1633 Full Code [id number]  Lance Coon, MD Inpatient   06/19/2016 1643 06/20/2016 2153 Full Code [id number]  Hollice Espy, MD Inpatient   12/29/2014 2357 01/01/2015 1731 Full Code [id number]  Danne Baxter Inpatient   10/06/2013 1929 10/11/2013 1800 Full Code [id number]  Lisette Abu, PA-C Inpatient   Advance Care Planning Activity    Advance Directive Documentation   Flowsheet Row Most Recent Value  Type of Advance Directive Living will  Pre-existing out of facility DNR order (yellow form or pink MOST form) -  "MOST" Form in Place? -       TOTAL TIME TAKING CARE OF THIS PATIENT: *35* minutes.    Fritzi Mandes M.D  [facility]    CC: Primary care physician; Bruce Berry, NP

## 2020-05-04 LAB — LEGIONELLA PNEUMOPHILA SEROGP 1 UR AG: L. pneumophila Serogp 1 Ur Ag: NEGATIVE

## 2020-05-07 LAB — CULTURE, BLOOD (ROUTINE X 2)
Culture: NO GROWTH
Culture: NO GROWTH
Special Requests: ADEQUATE
Special Requests: ADEQUATE

## 2020-07-01 ENCOUNTER — Other Ambulatory Visit: Payer: Self-pay | Admitting: Radiation Oncology

## 2020-07-22 ENCOUNTER — Other Ambulatory Visit: Payer: Self-pay

## 2020-07-22 ENCOUNTER — Emergency Department: Payer: Medicare Other

## 2020-07-22 DIAGNOSIS — Z79899 Other long term (current) drug therapy: Secondary | ICD-10-CM | POA: Insufficient documentation

## 2020-07-22 DIAGNOSIS — Z7901 Long term (current) use of anticoagulants: Secondary | ICD-10-CM | POA: Diagnosis not present

## 2020-07-22 DIAGNOSIS — I110 Hypertensive heart disease with heart failure: Secondary | ICD-10-CM | POA: Diagnosis not present

## 2020-07-22 DIAGNOSIS — R609 Edema, unspecified: Secondary | ICD-10-CM | POA: Insufficient documentation

## 2020-07-22 DIAGNOSIS — R0602 Shortness of breath: Secondary | ICD-10-CM | POA: Diagnosis not present

## 2020-07-22 DIAGNOSIS — F17200 Nicotine dependence, unspecified, uncomplicated: Secondary | ICD-10-CM | POA: Diagnosis not present

## 2020-07-22 DIAGNOSIS — J449 Chronic obstructive pulmonary disease, unspecified: Secondary | ICD-10-CM | POA: Insufficient documentation

## 2020-07-22 DIAGNOSIS — I480 Paroxysmal atrial fibrillation: Secondary | ICD-10-CM | POA: Diagnosis not present

## 2020-07-22 DIAGNOSIS — Z955 Presence of coronary angioplasty implant and graft: Secondary | ICD-10-CM | POA: Diagnosis not present

## 2020-07-22 DIAGNOSIS — Z85038 Personal history of other malignant neoplasm of large intestine: Secondary | ICD-10-CM | POA: Insufficient documentation

## 2020-07-22 DIAGNOSIS — I2510 Atherosclerotic heart disease of native coronary artery without angina pectoris: Secondary | ICD-10-CM | POA: Diagnosis not present

## 2020-07-22 DIAGNOSIS — Z8546 Personal history of malignant neoplasm of prostate: Secondary | ICD-10-CM | POA: Insufficient documentation

## 2020-07-22 DIAGNOSIS — I509 Heart failure, unspecified: Secondary | ICD-10-CM | POA: Diagnosis not present

## 2020-07-22 LAB — CBC
HCT: 39.6 % (ref 39.0–52.0)
Hemoglobin: 13.4 g/dL (ref 13.0–17.0)
MCH: 32.2 pg (ref 26.0–34.0)
MCHC: 33.8 g/dL (ref 30.0–36.0)
MCV: 95.2 fL (ref 80.0–100.0)
Platelets: 215 10*3/uL (ref 150–400)
RBC: 4.16 MIL/uL — ABNORMAL LOW (ref 4.22–5.81)
RDW: 12.7 % (ref 11.5–15.5)
WBC: 5.9 10*3/uL (ref 4.0–10.5)
nRBC: 0 % (ref 0.0–0.2)

## 2020-07-22 LAB — BASIC METABOLIC PANEL
Anion gap: 9 (ref 5–15)
BUN: 12 mg/dL (ref 8–23)
CO2: 22 mmol/L (ref 22–32)
Calcium: 9.4 mg/dL (ref 8.9–10.3)
Chloride: 108 mmol/L (ref 98–111)
Creatinine, Ser: 1.03 mg/dL (ref 0.61–1.24)
GFR, Estimated: >60 mL/min (ref >60)
Glucose, Bld: 106 mg/dL — ABNORMAL HIGH (ref 70–99)
Potassium: 3.6 mmol/L (ref 3.5–5.1)
Sodium: 139 mmol/L (ref 135–145)

## 2020-07-22 LAB — BRAIN NATRIURETIC PEPTIDE: B Natriuretic Peptide: 33.5 pg/mL (ref 0.0–100.0)

## 2020-07-22 NOTE — ED Triage Notes (Signed)
Pt saw heart doctor this week and was given an appointment for next week and was also put on a "fluid pill". Pt with hx of 4 Mis. Pt states he is here today for leg swelling. Pt with 4 + pitting edema to both lower legs. Pt states right leg is painful.

## 2020-07-23 ENCOUNTER — Emergency Department
Admission: EM | Admit: 2020-07-23 | Discharge: 2020-07-23 | Disposition: A | Discharge status: Home / Self Care | Payer: Medicare Other | Attending: Emergency Medicine | Admitting: Emergency Medicine

## 2020-07-23 DIAGNOSIS — R609 Edema, unspecified: Secondary | ICD-10-CM

## 2020-07-23 LAB — TROPONIN I (HIGH SENSITIVITY): Troponin I (High Sensitivity): 9 ng/L (ref <18)

## 2020-07-23 MED ORDER — FUROSEMIDE 10 MG/ML IJ SOLN
60.0000 mg | Freq: Once | INTRAMUSCULAR | Status: AC
  Administered 2020-07-23: 60 mg via INTRAVENOUS
  Filled 2020-07-23: qty 8

## 2020-07-23 MED ORDER — ONDANSETRON HCL 4 MG/2ML IJ SOLN
4.0000 mg | Freq: Once | INTRAMUSCULAR | Status: AC
  Administered 2020-07-23: 4 mg via INTRAVENOUS
  Filled 2020-07-23: qty 2

## 2020-07-23 MED ORDER — MORPHINE SULFATE (PF) 4 MG/ML IV SOLN
4.0000 mg | Freq: Once | INTRAVENOUS | Status: AC
  Administered 2020-07-23: 4 mg via INTRAVENOUS
  Filled 2020-07-23: qty 1

## 2020-07-23 NOTE — ED Provider Notes (Signed)
[facility] Emergency Department Provider Note  Time seen: 2:28 AM  I have reviewed the triage vital signs and the nursing notes.   HISTORY  Chief Complaint Leg Swelling and Shortness of Breath   HPI Bruce [name] is a 75 y.o. male with a past medical history of anxiety, arthritis, CHF, COPD, gastric reflux, hypertension, prior MI, presents to the emergency department for peripheral edema.  According to the patient over the past week or so he has had significant swelling in his legs as well as discomfort.  Patient states he followed up with his cardiologist 1 week ago who started him back on Lasix 20 mg daily which he had not been taking recently and scheduled him for a stress test and repeat visit next week.  Patient states he has been taking Lasix 20 mg each morning but states he continues to have worsening swelling and discomfort in the legs so he came to the emergency department for evaluation.  Patient does state mild shortness of breath especially with exertion.  Denies any chest pain.   Past Medical History:  Diagnosis Date  . Anginal pain (HCC)    Left side if chest ,NTG  relieves chaes apin 12/01/13  . Anxiety   . Arthritis   . Cancer [facility])    colon cancer  . CHF (congestive heart failure) (HCC)   . 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 Complication of anesthesia    wake up with a head ache  . COPD (chronic obstructive pulmonary disease) (HCC)   . Coronary artery disease   . GERD (gastroesophageal reflux disease)   . Headache    related to sinus congestion  . History of blood transfusion   . History of kidney stones   . Hypertension   . Myocardial infarction [facility])    '09 AND '12  . Peripheral vascular disease (HCC)   . Shortness of breath    With exertion .  Marland Kitchen UTI (urinary tract infection)    frequent UTI    Patient Active Problem List   Diagnosis Date Noted  . COPD with acute exacerbation  (HCC) 05/03/2020  . PAF (paroxysmal atrial fibrillation) (HCC)   . Syncope 12/31/2018  . Intracranial bleed (HCC) 12/31/2018  . AKI (acute kidney injury) (HCC)   . Frontal lobe contusion (HCC)   . Multiple closed facial bone fractures (HCC)   . NSTEMI (non-ST elevated myocardial infarction) (HCC) 10/10/2018  . Recurrent syncope 04/28/2018  . Malignant neoplasm of prostate (HCC) 07/13/2016  . 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 Visual hallucination 03/18/2015  . Auditory hallucination 03/18/2015  . 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 Tibial plateau fracture 12/29/2014  . Limb ischemia 11/18/2013  . Blunt trauma of lower leg 10/08/2013  . 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HCC) 10/08/2013  . Acute blood loss anemia 10/08/2013  . Anxiety disorder 10/08/2013  . Dyslipidemia 10/08/2013  . GERD (gastroesophageal reflux disease) 10/08/2013  . History of MI (myocardial infarction) 10/08/2013  . Anticoagulated 10/08/2013  . Hypertension   . Tibia/fibula fracture 10/06/2013  . CAD (coronary artery disease) 08/16/2010  . Edema 08/16/2010  . COPD (chronic obstructive pulmonary disease) (HCC) 08/16/2010  . DVT of leg (deep venous thrombosis) (HCC) 08/16/2010  . Leg cramps 08/16/2010    Past Surgical History:  Procedure Laterality Date  . CARDIAC CATHETERIZATION    . CHOLECYSTECTOMY    . EXTERNAL FIXATION LEG Right 10/06/2013  Procedure: CLOSED REDUCTION RIGHT TIBIAL PLATEAU FRACTURE, EXTERNAL FIXATION RIGHT LEG, PLACEMENT OF WOUND VAC;  Surgeon: Rozanna Box, MD;  Location: [place];  Service: Orthopedics;  Laterality: Right;  . FEMORAL-POPLITEAL BYPASS GRAFT Right 10/06/2013   Procedure: RIGHT POPLITEAL-POPLITEAL ARTERY BYPASS GRAFT;  Surgeon: Rosetta Posner, MD;  Location: [place];  Service: Vascular;  Laterality: Right;  . FRACTURE SURGERY Right 2014  . HARDWARE REMOVAL Right 12/02/2013   Procedure: REMOVAL EXTERNAL FIXATION RIGHT LEG ;  Surgeon:  Rozanna Box, MD;  Location: [place];  Service: Orthopedics;  Laterality: Right;  . HERNIA REPAIR Right 1990's  . I & D EXTREMITY Right 10/09/2013   Procedure: IRRIGATION AND DEBRIDEMENT RIGHT LEG, CLOSURE  OF WOUNDS, PLACEMENT OF WOUND VAC ON EACH SIDE OF LEG;  Surgeon: Rozanna Box, MD;  Location: [place];  Service: Orthopedics;  Laterality: Right;  . IR NEPHROSTOMY PLACEMENT LEFT  06/19/2016  . IVC filter  2009   placed @ [facility]/ Removed in 2010.  . IVC Filter Removed    . LEFT HEART CATH AND CORONARY ANGIOGRAPHY Right 10/11/2018   Procedure: LEFT HEART CATH AND CORONARY ANGIOGRAPHY;  Surgeon: [name], MD;  Location: [place] [facility];  Service: Cardiovascular;  Laterality: Right;  . ligament leg Left   . NEPHROLITHOTOMY Left 06/19/2016   Procedure: NEPHROLITHOTOMY PERCUTANEOUS;  Surgeon: Hollice Espy, MD;  Location: [facility];  Service: Urology;  Laterality: Left;  . ORIF TIBIA FRACTURE Right 12/29/2014   Procedure: OPEN REDUCTION INTERNAL FIXATION (ORIF) RIGHT TIBIA FRACTURE, RIA VS ICBG;  Surgeon: Altamese Adrian, MD;  Location: [place];  Service: Orthopedics;  Laterality: Right;  . RADIOACTIVE SEED IMPLANT N/A 09/12/2016   Procedure: RADIOACTIVE SEED IMPLANT/BRACHYTHERAPY IMPLANT;  Surgeon: Hollice Espy, MD;  Location: [facility];  Service: Urology;  Laterality: N/A;    Prior to Admission medications   Medication Sig Start Date End Date Taking? Authorizing Provider  acetaminophen (TYLENOL) 325 MG tablet Take 2 tablets (650 mg total) by mouth every 6 (six) hours as needed for mild pain or headache. 10/11/18   Gouru, Illene Silver, MD  albuterol (PROVENTIL HFA;VENTOLIN HFA) 108 (90 Base) MCG/ACT inhaler Inhale 2 puffs into the lungs every 6 (six) hours as needed for wheezing or shortness of breath. 04/30/18   Pyreddy, Reatha Harps, MD  ALPRAZolam Duanne Moron) 0.5 MG tablet Take 0.5 mg by mouth 2 (two) times daily as needed. 02/20/20   [provider]  atorvastatin (LIPITOR) 80 MG tablet Take 1 tablet  (80 mg total) by mouth at bedtime. 01/02/19 02/01/19  Kayleen Memos, DO  carvedilol (COREG) 12.5 MG tablet Take 1 tablet (12.5 mg total) by mouth 2 (two) times daily. 01/02/19   Kayleen Memos, DO  cholecalciferol (VITAMIN D) 1000 units tablet Take 1,000 Units by mouth daily.     [provider]  fluticasone (FLONASE) 50 MCG/ACT nasal spray Place 1 spray into both nostrils. 04/12/20   [provider]  losartan (COZAAR) 50 MG tablet Take 50 mg by mouth daily. 03/05/20   [provider]  Multiple Vitamin (MULTIVITAMIN) tablet Take 1 tablet by mouth daily. For Men    [provider]  nitroGLYCERIN (NITROSTAT) 0.4 MG SL tablet Place 0.4 mg under the tongue every 5 (five) minutes as needed for chest pain.     [provider]  Omega-3 Fatty Acids (FISH OIL PO) Take 1,000 mg by mouth every evening.     [provider]  omeprazole (PRILOSEC) 40 MG capsule  Take 40 mg by mouth 2 (two) times daily.     [provider]  oxyCODONE-acetaminophen (PERCOCET/ROXICET) 5-325 MG tablet Take 1 tablet by mouth 2 (two) times daily as needed. 02/20/20   [provider]  REPATHA SURECLICK 939 MG/ML SOAJ Inject 1 Syringe into the skin every 14 (fourteen) days. Patient not taking: No sig reported 12/10/18   [provider]  rivaroxaban (XARELTO) 20 MG TABS tablet Take 1 tablet (20 mg total) by mouth daily. 01/06/19   Kayleen Memos, DO  rOPINIRole (REQUIP) 1 MG tablet Take 1 mg by mouth at bedtime. 11/04/18   [provider]  tamsulosin (FLOMAX) 0.4 MG CAPS capsule TAKE 1 CAPSULE BY MOUTH ONCE DAILY AFTERSUPPER 07/01/20   Noreene Filbert, MD    Allergies  Allergen Reactions  . Adhesive [Tape] Other (See Comments)    After right leg fracture surgery, pt developed a large blister where tape was applied to his right leg. OK to use paper tape.  . Imdur [Isosorbide Dinitrate] Other (See Comments)    hallucinations  . Singulair [Montelukast  Sodium] Other (See Comments)    Hallucinations     Family History  Problem Relation Age of Onset  . Hypertension Mother   . Prostate cancer Brother   . Mental illness Neg Hx     Social History Social History   Tobacco Use  . Smoking status: Current Some Day Smoker    Packs/day: 0.50    Years: 52.00    Pack years: 26.00  . Smokeless tobacco: Never Used  Vaping Use  . Vaping Use: Never used  Substance Use Topics  . Alcohol use: No    Alcohol/week: 0.0 standard drinks    Comment: Stopped 2009  . Drug use: No    Review of Systems Constitutional: Negative for fever. Cardiovascular: Negative for chest pain. Respiratory: Shortness of breath with exertion Gastrointestinal: Negative for abdominal pain Musculoskeletal: Increased lower extremity edema Neurological: Negative for headache All other ROS negative  ____________________________________________   PHYSICAL EXAM:  VITAL SIGNS: ED Triage Vitals  Enc Vitals Group     BP 07/22/20 1948 (!) 141/92     Pulse Rate 07/22/20 1948 77     Resp 07/22/20 1948 20     Temp 07/22/20 1948 97.7 F (36.5 C)     Temp Source 07/22/20 1948 Oral     SpO2 07/22/20 1948 97 %     Weight 07/22/20 1949 186 lb (84.4 kg)     Height 07/22/20 1949 6\' 1"  (1.854 m)     Head Circumference --      Peak Flow --      Pain Score 07/22/20 1948 10     Pain Loc --      Pain Edu? --      Excl. in [place]? --    Constitutional: Alert and oriented. Well appearing and in no distress. Eyes: Normal exam ENT      Head: Normocephalic and atraumatic.      Mouth/Throat: Mucous membranes are moist. Cardiovascular: Normal rate, regular rhythm.  Respiratory: Normal respiratory effort without tachypnea nor retractions.  Slight expiratory wheeze. Gastrointestinal: Soft and nontender. No distention.  Musculoskeletal: 3+ bilateral lower extremity edema.  Mild tenderness to palpation throughout. Neurologic:  Normal speech and language. No gross focal neurologic  deficits Skin:  Skin is warm, dry and intact.  Psychiatric: Mood and affect are normal.   ____________________________________________    EKG  EKG viewed and interpreted by myself shows a normal  sinus rhythm at 65 bpm with a narrow QRS, normal axis, normal intervals, nonspecific ST changes.  ____________________________________________    RADIOLOGY  Chest x-ray is negative  ____________________________________________   INITIAL IMPRESSION / ASSESSMENT AND PLAN / ED COURSE  Pertinent labs & imaging results that were available during my care of the patient were reviewed by me and considered in my medical decision making (see chart for details).   Patient presents emergency department for peripheral edema worsening over the past week or so.  Patient's work-up is thus far reassuring including a normal BNP and a clear chest x-ray.  Patient does have fairly significant lower extremity edema bilaterally.  States he does not normally take Lasix but his cardiologist put him back on Lasix 20 mg each morning 1 week ago but he has not noticed an appreciable difference in the lower extremity edema.  Patient states discomfort in both legs as well worsening with increased edema.  We will add on cardiac enzymes as a precaution.  We will dose 60 mg of IV Lasix.  We will continue to closely monitor and reassess.  Patient's cardiac enzymes are normal.  BNP normal.  Work-up is reassuring.  I offered admission for IV diuresis versus discharge home.  Patient agreeable to try discharge home and if not improved in the next 2 to 3 days or if he is worsening or develops shortness of breath he is to return to the emergency department.  Otherwise he will follow-up with his cardiologist.  Patient agreeable to plan.  Bruce [name] was evaluated in Emergency Department on 07/23/2020 for the symptoms described in the history of present illness. He was evaluated in the context of the global COVID-19 pandemic, which  necessitated consideration that the patient might be at risk for infection with the SARS-CoV-2 virus that causes COVID-19. Institutional protocols and algorithms that pertain to the evaluation of patients at risk for COVID-19 are in a state of rapid change based on information released by regulatory bodies including the CDC and federal and state organizations. These policies and algorithms were followed during the patient's care in the ED.  ____________________________________________   FINAL CLINICAL IMPRESSION(S) / ED DIAGNOSES  Peripheral edema   Harvest Dark, MD 07/23/20 [phone]

## 2020-07-23 NOTE — Discharge Instructions (Addendum)
Please double your Lasix/furosemide (fluid pill) for the next 7 days.  Please call your cardiologist tomorrow to arrange a follow-up appointment for recheck/reevaluation.  Return to the emergency department for any worsening shortness of breath, worsening swelling, chest pain or any other symptom personally concerning to yourself.

## 2020-09-30 DIAGNOSIS — I2510 Atherosclerotic heart disease of native coronary artery without angina pectoris: Secondary | ICD-10-CM | POA: Diagnosis not present

## 2020-09-30 DIAGNOSIS — I10 Essential (primary) hypertension: Secondary | ICD-10-CM | POA: Diagnosis not present

## 2020-09-30 DIAGNOSIS — R609 Edema, unspecified: Secondary | ICD-10-CM | POA: Diagnosis not present

## 2020-09-30 DIAGNOSIS — I5020 Unspecified systolic (congestive) heart failure: Secondary | ICD-10-CM | POA: Diagnosis not present

## 2020-09-30 DIAGNOSIS — E785 Hyperlipidemia, unspecified: Secondary | ICD-10-CM | POA: Diagnosis not present

## 2020-09-30 DIAGNOSIS — J302 Other seasonal allergic rhinitis: Secondary | ICD-10-CM | POA: Diagnosis not present

## 2020-09-30 DIAGNOSIS — I2699 Other pulmonary embolism without acute cor pulmonale: Secondary | ICD-10-CM | POA: Diagnosis not present

## 2020-09-30 DIAGNOSIS — G8929 Other chronic pain: Secondary | ICD-10-CM | POA: Diagnosis not present

## 2020-09-30 DIAGNOSIS — R7303 Prediabetes: Secondary | ICD-10-CM | POA: Diagnosis not present

## 2020-10-01 DIAGNOSIS — R7303 Prediabetes: Secondary | ICD-10-CM | POA: Diagnosis not present

## 2020-10-01 DIAGNOSIS — I10 Essential (primary) hypertension: Secondary | ICD-10-CM | POA: Diagnosis not present

## 2020-10-01 DIAGNOSIS — E785 Hyperlipidemia, unspecified: Secondary | ICD-10-CM | POA: Diagnosis not present

## 2020-10-06 DIAGNOSIS — E875 Hyperkalemia: Secondary | ICD-10-CM | POA: Diagnosis not present

## 2020-10-10 ENCOUNTER — Other Ambulatory Visit: Payer: Self-pay

## 2020-10-10 DIAGNOSIS — J441 Chronic obstructive pulmonary disease with (acute) exacerbation: Secondary | ICD-10-CM | POA: Diagnosis not present

## 2020-10-10 DIAGNOSIS — Z85038 Personal history of other malignant neoplasm of large intestine: Secondary | ICD-10-CM | POA: Diagnosis not present

## 2020-10-10 DIAGNOSIS — Z7901 Long term (current) use of anticoagulants: Secondary | ICD-10-CM | POA: Diagnosis not present

## 2020-10-10 DIAGNOSIS — R404 Transient alteration of awareness: Secondary | ICD-10-CM | POA: Diagnosis not present

## 2020-10-10 DIAGNOSIS — F17210 Nicotine dependence, cigarettes, uncomplicated: Secondary | ICD-10-CM | POA: Insufficient documentation

## 2020-10-10 DIAGNOSIS — Z8546 Personal history of malignant neoplasm of prostate: Secondary | ICD-10-CM | POA: Insufficient documentation

## 2020-10-10 DIAGNOSIS — I2510 Atherosclerotic heart disease of native coronary artery without angina pectoris: Secondary | ICD-10-CM | POA: Insufficient documentation

## 2020-10-10 DIAGNOSIS — R55 Syncope and collapse: Secondary | ICD-10-CM | POA: Insufficient documentation

## 2020-10-10 DIAGNOSIS — R609 Edema, unspecified: Secondary | ICD-10-CM | POA: Diagnosis not present

## 2020-10-10 DIAGNOSIS — Z86718 Personal history of other venous thrombosis and embolism: Secondary | ICD-10-CM | POA: Insufficient documentation

## 2020-10-10 DIAGNOSIS — E860 Dehydration: Secondary | ICD-10-CM | POA: Diagnosis not present

## 2020-10-10 DIAGNOSIS — Z79899 Other long term (current) drug therapy: Secondary | ICD-10-CM | POA: Diagnosis not present

## 2020-10-10 DIAGNOSIS — R6889 Other general symptoms and signs: Secondary | ICD-10-CM | POA: Diagnosis not present

## 2020-10-10 DIAGNOSIS — Z743 Need for continuous supervision: Secondary | ICD-10-CM | POA: Diagnosis not present

## 2020-10-10 DIAGNOSIS — R42 Dizziness and giddiness: Secondary | ICD-10-CM | POA: Diagnosis not present

## 2020-10-10 LAB — COMPREHENSIVE METABOLIC PANEL
ALT: 15 U/L (ref 0–44)
AST: 18 U/L (ref 15–41)
Albumin: 3.6 g/dL (ref 3.5–5.0)
Alkaline Phosphatase: 70 U/L (ref 38–126)
Anion gap: 5 (ref 5–15)
BUN: 11 mg/dL (ref 8–23)
CO2: 27 mmol/L (ref 22–32)
Calcium: 9 mg/dL (ref 8.9–10.3)
Chloride: 107 mmol/L (ref 98–111)
Creatinine, Ser: 1.18 mg/dL (ref 0.61–1.24)
GFR, Estimated: >60 mL/min (ref >60)
Glucose, Bld: 115 mg/dL — ABNORMAL HIGH (ref 70–99)
Potassium: 4.7 mmol/L (ref 3.5–5.1)
Sodium: 139 mmol/L (ref 135–145)
Total Bilirubin: 0.7 mg/dL (ref 0.3–1.2)
Total Protein: 6.9 g/dL (ref 6.5–8.1)

## 2020-10-10 LAB — CBC
HCT: 36.6 % — ABNORMAL LOW (ref 39.0–52.0)
Hemoglobin: 12.7 g/dL — ABNORMAL LOW (ref 13.0–17.0)
MCH: 33.5 pg (ref 26.0–34.0)
MCHC: 34.7 g/dL (ref 30.0–36.0)
MCV: 96.6 fL (ref 80.0–100.0)
Platelets: 185 10*3/uL (ref 150–400)
RBC: 3.79 MIL/uL — ABNORMAL LOW (ref 4.22–5.81)
RDW: 12.9 % (ref 11.5–15.5)
WBC: 10 10*3/uL (ref 4.0–10.5)
nRBC: 0 % (ref 0.0–0.2)

## 2020-10-10 LAB — TROPONIN I (HIGH SENSITIVITY): Troponin I (High Sensitivity): 18 ng/L — ABNORMAL HIGH (ref <18)

## 2020-10-10 NOTE — ED Triage Notes (Signed)
Pt in via [facility] from a bar with c/o hot flashes and dizziness. Pt also with NV. Initial BP was in 70's. Pt had his lasix adjusted recently.  Pt reports has not had any alcohol today. 120/86, HR 80, FBS 102, 98% RA, #18 to left AC and has been given 556ms of NS

## 2020-10-10 NOTE — ED Triage Notes (Signed)
Pt states he began to feel nauseated and felt like he was going to faint this afternoon. Pt states he feels cold. Pt with emesis x1. Pt denies abd pain, chest pain.

## 2020-10-11 ENCOUNTER — Emergency Department
Admission: EM | Admit: 2020-10-11 | Discharge: 2020-10-11 | Disposition: A | Discharge status: Home / Self Care | Payer: Medicare Other | Attending: Emergency Medicine | Admitting: Emergency Medicine

## 2020-10-11 DIAGNOSIS — R55 Syncope and collapse: Secondary | ICD-10-CM | POA: Diagnosis not present

## 2020-10-11 DIAGNOSIS — E860 Dehydration: Secondary | ICD-10-CM | POA: Diagnosis not present

## 2020-10-11 LAB — TROPONIN I (HIGH SENSITIVITY): Troponin I (High Sensitivity): 14 ng/L (ref <18)

## 2020-10-11 MED ORDER — SODIUM CHLORIDE 0.9 % IV BOLUS
500.0000 mL | Freq: Once | INTRAVENOUS | Status: AC
  Administered 2020-10-11: 500 mL via INTRAVENOUS

## 2020-10-11 NOTE — ED Provider Notes (Signed)
[facility] Emergency Department Provider Note  Time seen: 1:47 AM  I have reviewed the triage vital signs and the nursing notes.   HISTORY  Chief Complaint Near Syncope   HPI Bruce [name] is a 75 y.o. male with a past medical history of anxiety, CHF, prior MI, COPD, gastric reflux, hypertension, presents to the emergency department after near syncopal episode.  According to the patient around 5 or 6 PM this evening while outside he had a near syncopal episode in which he felt very lightheaded like he was going to pass out.  EMS was called, EMS states initial blood pressure in the [id number] systolic, patient received IV fluids and upon arrival to the emergency department patient's blood pressure is normal.  Patient states he is feeling much better.  Denies any chest pain or shortness of breath at any point.  Denies any abdominal pain.  No fever, vomiting.  Overall patient appears well blood pressure currently 117/92.   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   UTI (urinary tract infection)    frequent UTI    Patient Active Problem List   Diagnosis Date Noted   COPD with acute exacerbation ([place]) 05/03/2020   PAF (paroxysmal atrial fibrillation) ([place])    Syncope 12/31/2018    Intracranial bleed ([place]) 12/31/2018   AKI (acute kidney injury) ([place])    Frontal lobe contusion ([place])    Multiple closed facial bone fractures (HCC)    NSTEMI (non-ST elevated myocardial infarction) ([place]) 10/10/2018   Recurrent syncope 04/28/2018   Malignant neoplasm of prostate ([place]) 07/13/2016   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   Visual hallucination 03/18/2015   Auditory hallucination 03/18/2015   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 01/01/2015   Tibial plateau fracture 12/29/2014   Limb ischemia 11/18/2013   Blunt trauma of lower leg 10/08/2013   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[place]) 10/08/2013   Acute blood loss anemia 10/08/2013   Anxiety disorder 10/08/2013   Dyslipidemia 10/08/2013   GERD (gastroesophageal reflux disease) 10/08/2013   History of MI (myocardial infarction) 10/08/2013   Anticoagulated 10/08/2013   Hypertension    Tibia/fibula fracture 10/06/2013   CAD (coronary artery disease) 08/16/2010   Edema 08/16/2010   COPD (chronic obstructive pulmonary disease) ([place]) 08/16/2010   DVT of leg (deep venous thrombosis) ([place]) 08/16/2010   Leg cramps 08/16/2010    Past Surgical History:  Procedure Laterality Date   CARDIAC CATHETERIZATION     CHOLECYSTECTOMY     EXTERNAL FIXATION LEG Right 10/06/2013   Procedure: CLOSED REDUCTION RIGHT TIBIAL PLATEAU FRACTURE, EXTERNAL FIXATION RIGHT LEG, PLACEMENT OF WOUND VAC;  Surgeon: Rozanna Box, MD;  Location: [place];  Service: Orthopedics;  Laterality: Right;   FEMORAL-POPLITEAL BYPASS GRAFT Right 10/06/2013   Procedure: RIGHT POPLITEAL-POPLITEAL ARTERY BYPASS GRAFT;  Surgeon: Rosetta Posner, MD;  Location: [place];  Service: Vascular;  Laterality: Right;   FRACTURE SURGERY Right 2014   HARDWARE REMOVAL Right 12/02/2013   Procedure: REMOVAL EXTERNAL FIXATION RIGHT LEG ;  Surgeon: Rozanna Box, MD;  Location: [place];  Service: Orthopedics;  Laterality: Right;   HERNIA REPAIR Right 1990's   I & D  EXTREMITY Right 10/09/2013   Procedure: IRRIGATION AND DEBRIDEMENT RIGHT LEG, CLOSURE  OF WOUNDS, PLACEMENT OF WOUND VAC ON EACH SIDE OF LEG;  Surgeon: Rozanna Box, MD;  Location: [place];  Service: Orthopedics;  Laterality: Right;   IR NEPHROSTOMY PLACEMENT LEFT  06/19/2016   IVC filter  2009   placed @ [facility]/ Removed in 2010.   IVC Filter Removed     LEFT HEART CATH AND CORONARY ANGIOGRAPHY Right 10/11/2018   Procedure: LEFT HEART CATH AND CORONARY ANGIOGRAPHY;  Surgeon: [name], MD;  Location: [place] [facility];  Service: Cardiovascular;  Laterality: Right;   ligament leg Left    NEPHROLITHOTOMY Left 06/19/2016   Procedure: NEPHROLITHOTOMY PERCUTANEOUS;  Surgeon: Hollice Espy, MD;  Location: [facility];  Service: Urology;  Laterality: Left;   ORIF TIBIA FRACTURE Right 12/29/2014   Procedure: OPEN REDUCTION INTERNAL FIXATION (ORIF) RIGHT TIBIA FRACTURE, RIA VS ICBG;  Surgeon: Altamese Reynolds, MD;  Location: [place];  Service: Orthopedics;  Laterality: Right;   RADIOACTIVE SEED IMPLANT N/A 09/12/2016   Procedure: RADIOACTIVE SEED IMPLANT/BRACHYTHERAPY IMPLANT;  Surgeon: Hollice Espy, MD;  Location: [facility];  Service: Urology;  Laterality: N/A;    Prior to Admission medications   Medication Sig Start Date End Date Taking? Authorizing Provider  acetaminophen (TYLENOL) 325 MG tablet Take 2 tablets (650 mg total) by mouth every 6 (six) hours as needed for mild pain or headache. 10/11/18   Gouru, Illene Silver, MD  albuterol (PROVENTIL HFA;VENTOLIN HFA) 108 (90 Base) MCG/ACT inhaler Inhale 2 puffs into the lungs every 6 (six) hours as needed for wheezing or shortness of breath. 04/30/18   Pyreddy, Reatha Harps, MD  ALPRAZolam Duanne Moron) 0.5 MG tablet Take 0.5 mg by mouth 2 (two) times daily as needed. 02/20/20   [provider]  atorvastatin (LIPITOR) 80 MG tablet Take 1 tablet (80 mg total) by mouth at bedtime. 01/02/19 02/01/19  Kayleen Memos, DO  carvedilol (COREG) 12.5 MG tablet Take 1 tablet (12.5  mg total) by mouth 2 (two) times daily. 01/02/19   Kayleen Memos, DO  cholecalciferol (VITAMIN D) 1000 units tablet Take 1,000 Units by mouth daily.     [provider]  fluticasone (FLONASE) 50 MCG/ACT nasal spray Place 1 spray into both nostrils. 04/12/20   [provider]  losartan (COZAAR) 50 MG tablet Take 50 mg by mouth daily. 03/05/20   [provider]  Multiple Vitamin (MULTIVITAMIN) tablet Take 1 tablet by mouth daily. For Men    [provider]  nitroGLYCERIN (NITROSTAT) 0.4 MG SL tablet Place 0.4 mg under the tongue every 5 (five) minutes as needed for chest pain.     [provider]  Omega-3 Fatty Acids (FISH OIL PO) Take 1,000 mg by mouth every evening.     [provider]  omeprazole (PRILOSEC) 40 MG capsule Take 40 mg by mouth 2 (two) times daily.     [provider]  oxyCODONE-acetaminophen (PERCOCET/ROXICET) 5-325 MG tablet Take 1 tablet by mouth 2 (two) times daily as needed. 02/20/20   [provider]  REPATHA SURECLICK [id number] MG/ML SOAJ Inject 1 Syringe into the skin every 14 (fourteen) days. Patient not taking: No sig reported 12/10/18   [provider]  rivaroxaban (XARELTO) 20 MG TABS tablet Take 1 tablet (20 mg total) by mouth daily. 01/06/19   Kayleen Memos, DO  rOPINIRole (REQUIP) 1 MG tablet Take 1 mg by mouth at bedtime. 11/04/18   [provider]  tamsulosin (FLOMAX) 0.4 MG CAPS capsule TAKE 1 CAPSULE BY MOUTH ONCE DAILY AFTERSUPPER 07/01/20   Noreene Filbert, MD    Allergies  Allergen Reactions   Adhesive [Tape] Other (See Comments)    After right leg fracture surgery, pt developed a large blister where tape was applied to his right leg. OK to use paper tape.   Imdur [Isosorbide Dinitrate] Other (See Comments)    hallucinations   Singulair [Montelukast Sodium] Other (See Comments)    Hallucinations     Family History  Problem Relation Age of Onset   Hypertension Mother     Prostate cancer Brother    Mental illness Neg Hx     Social History Social History   Tobacco Use   Smoking status: Some Days    Packs/day: 0.50    Years: 52.00    Pack years: 26.00    Types: Cigarettes   Smokeless tobacco: Never  Vaping Use   Vaping Use: Never used  Substance Use Topics   Alcohol use: No    Alcohol/week: 0.0 standard drinks    Comment: Stopped 2009   Drug use: No    Review of Systems Constitutional: Negative for fever.  Near syncope earlier tonight. Cardiovascular: Negative for chest pain. Respiratory: Negative for shortness of breath. Gastrointestinal: Negative for abdominal pain, vomiting Musculoskeletal: Negative for musculoskeletal complaints Neurological: Negative for headache All other ROS negative  ____________________________________________   PHYSICAL EXAM:  VITAL SIGNS: ED Triage Vitals  Enc Vitals Group     BP 10/10/20 1916 115/77     Pulse Rate 10/10/20 1914 75     Resp 10/10/20 1916 16     Temp 10/10/20 1914 97.8 F (36.6 C)     Temp Source 10/10/20 1914 Oral     SpO2 10/10/20 1916 97 %     Weight 10/10/20 1916 180 lb (81.6 kg)     Height 10/10/20 1916 '6\' 1"'$  (1.854 m)     Head Circumference --      Peak Flow --      Pain Score 10/10/20 1916 0     Pain Loc --      Pain Edu? --      Excl. in [place]? --    Constitutional: Alert and oriented. Well appearing and in no distress. Eyes: Normal exam ENT      Head: Normocephalic and atraumatic.      Mouth/Throat: Mucous membranes are moist. Cardiovascular: Normal rate, regular rhythm. Respiratory: Normal respiratory effort without tachypnea nor retractions. Breath sounds are clear Gastrointestinal: Soft and nontender. No distention.  Musculoskeletal: Nontender with normal range of motion in all extremities.  Neurologic:  Normal speech and language. No gross focal neurologic deficits Skin:  Skin is warm, dry and intact.  Psychiatric: Mood and affect are normal.    ____________________________________________    EKG  EKG viewed and interpreted by myself shows a normal sinus rhythm 81 bpm with a narrow QRS, normal axis, normal intervals,  nonspecific ST changes.  ____________________________________________    INITIAL IMPRESSION / ASSESSMENT AND PLAN / ED COURSE  Pertinent labs & imaging results that were available during my care of the patient were reviewed by me and considered in my medical decision making (see chart for details).   Patient presents emergency department for near syncope/lightheadedness found to be hypotensive upon arrival.  Patient states he was outside in the heat for most of the day today, and could very likely be related to dehydration and heat exhaustion.  Patient's labs show a slight troponin elevation of 18.  Patient receiving 500 cc of IV fluids.  We will repeat a troponin.  We will continue to closely monitor the patient.  States he is feeling much better.  No longer feels lightheaded.  Remainder the lab work is overall reassuring.  Patient is feeling better after IV fluids.  Repeat troponin is negative.  Patient appears well I believe he is safe for discharge home did recommend PCP follow-up.  Discussed my typical return precautions.  Patient agreeable to plan of care.  Bruce [name] was evaluated in Emergency Department on 10/11/2020 for the symptoms described in the history of present illness. He was evaluated in the context of the global COVID-19 pandemic, which necessitated consideration that the patient might be at risk for infection with the SARS-CoV-2 virus that causes COVID-19. Institutional protocols and algorithms that pertain to the evaluation of patients at risk for COVID-19 are in a state of rapid change based on information released by regulatory bodies including the CDC and federal and state organizations. These policies and algorithms were followed during the patient's care in the  ED.  ____________________________________________   FINAL CLINICAL IMPRESSION(S) / ED DIAGNOSES  Dehydration Near syncope   Harvest Dark, MD 10/11/20 [phone]

## 2020-12-02 DIAGNOSIS — I509 Heart failure, unspecified: Secondary | ICD-10-CM | POA: Diagnosis not present

## 2020-12-02 DIAGNOSIS — I340 Nonrheumatic mitral (valve) insufficiency: Secondary | ICD-10-CM | POA: Diagnosis not present

## 2020-12-02 DIAGNOSIS — I10 Essential (primary) hypertension: Secondary | ICD-10-CM | POA: Diagnosis not present

## 2020-12-02 DIAGNOSIS — I2510 Atherosclerotic heart disease of native coronary artery without angina pectoris: Secondary | ICD-10-CM | POA: Diagnosis not present

## 2020-12-02 DIAGNOSIS — E785 Hyperlipidemia, unspecified: Secondary | ICD-10-CM | POA: Diagnosis not present

## 2020-12-23 DIAGNOSIS — I340 Nonrheumatic mitral (valve) insufficiency: Secondary | ICD-10-CM | POA: Diagnosis not present

## 2020-12-23 DIAGNOSIS — I2699 Other pulmonary embolism without acute cor pulmonale: Secondary | ICD-10-CM | POA: Diagnosis not present

## 2020-12-23 DIAGNOSIS — I509 Heart failure, unspecified: Secondary | ICD-10-CM | POA: Diagnosis not present

## 2020-12-23 DIAGNOSIS — I2510 Atherosclerotic heart disease of native coronary artery without angina pectoris: Secondary | ICD-10-CM | POA: Diagnosis not present

## 2020-12-23 DIAGNOSIS — E785 Hyperlipidemia, unspecified: Secondary | ICD-10-CM | POA: Diagnosis not present

## 2020-12-23 DIAGNOSIS — I10 Essential (primary) hypertension: Secondary | ICD-10-CM | POA: Diagnosis not present

## 2020-12-31 DIAGNOSIS — I340 Nonrheumatic mitral (valve) insufficiency: Secondary | ICD-10-CM | POA: Diagnosis not present

## 2021-01-04 DIAGNOSIS — I340 Nonrheumatic mitral (valve) insufficiency: Secondary | ICD-10-CM | POA: Diagnosis not present

## 2021-01-04 DIAGNOSIS — E785 Hyperlipidemia, unspecified: Secondary | ICD-10-CM | POA: Diagnosis not present

## 2021-01-04 DIAGNOSIS — I10 Essential (primary) hypertension: Secondary | ICD-10-CM | POA: Diagnosis not present

## 2021-01-04 DIAGNOSIS — I2699 Other pulmonary embolism without acute cor pulmonale: Secondary | ICD-10-CM | POA: Diagnosis not present

## 2021-01-04 DIAGNOSIS — I509 Heart failure, unspecified: Secondary | ICD-10-CM | POA: Diagnosis not present

## 2021-01-04 DIAGNOSIS — R0602 Shortness of breath: Secondary | ICD-10-CM | POA: Diagnosis not present

## 2021-01-04 DIAGNOSIS — I2510 Atherosclerotic heart disease of native coronary artery without angina pectoris: Secondary | ICD-10-CM | POA: Diagnosis not present

## 2021-01-04 DIAGNOSIS — R609 Edema, unspecified: Secondary | ICD-10-CM | POA: Diagnosis not present

## 2021-01-04 DIAGNOSIS — I351 Nonrheumatic aortic (valve) insufficiency: Secondary | ICD-10-CM | POA: Diagnosis not present

## 2021-01-18 DIAGNOSIS — G894 Chronic pain syndrome: Secondary | ICD-10-CM | POA: Diagnosis not present

## 2021-01-18 DIAGNOSIS — Z79891 Long term (current) use of opiate analgesic: Secondary | ICD-10-CM | POA: Diagnosis not present

## 2021-01-18 DIAGNOSIS — M25569 Pain in unspecified knee: Secondary | ICD-10-CM | POA: Diagnosis not present

## 2021-01-18 DIAGNOSIS — Z79899 Other long term (current) drug therapy: Secondary | ICD-10-CM | POA: Diagnosis not present

## 2021-02-01 ENCOUNTER — Other Ambulatory Visit: Payer: Self-pay | Admitting: Nurse Practitioner

## 2021-02-01 DIAGNOSIS — J069 Acute upper respiratory infection, unspecified: Secondary | ICD-10-CM | POA: Diagnosis not present

## 2021-02-01 DIAGNOSIS — I10 Essential (primary) hypertension: Secondary | ICD-10-CM | POA: Diagnosis not present

## 2021-02-01 DIAGNOSIS — R0600 Dyspnea, unspecified: Secondary | ICD-10-CM | POA: Diagnosis not present

## 2021-02-01 DIAGNOSIS — R7303 Prediabetes: Secondary | ICD-10-CM | POA: Diagnosis not present

## 2021-02-01 DIAGNOSIS — M25561 Pain in right knee: Secondary | ICD-10-CM | POA: Diagnosis not present

## 2021-02-01 DIAGNOSIS — E785 Hyperlipidemia, unspecified: Secondary | ICD-10-CM | POA: Diagnosis not present

## 2021-02-02 ENCOUNTER — Ambulatory Visit
Admission: RE | Admit: 2021-02-02 | Discharge: 2021-02-02 | Disposition: A | Discharge status: Home / Self Care | Payer: Medicare Other | Attending: Nurse Practitioner | Admitting: Nurse Practitioner

## 2021-02-02 ENCOUNTER — Ambulatory Visit
Admission: RE | Admit: 2021-02-02 | Discharge: 2021-02-02 | Disposition: A | Discharge status: Home / Self Care | Payer: Medicare Other | Source: Ambulatory Visit | Attending: Nurse Practitioner | Admitting: Nurse Practitioner

## 2021-02-02 DIAGNOSIS — R059 Cough, unspecified: Secondary | ICD-10-CM | POA: Diagnosis not present

## 2021-02-02 DIAGNOSIS — R0602 Shortness of breath: Secondary | ICD-10-CM | POA: Diagnosis not present

## 2021-02-02 DIAGNOSIS — R0600 Dyspnea, unspecified: Secondary | ICD-10-CM | POA: Insufficient documentation

## 2021-02-10 DIAGNOSIS — I509 Heart failure, unspecified: Secondary | ICD-10-CM | POA: Diagnosis not present

## 2021-02-10 DIAGNOSIS — I351 Nonrheumatic aortic (valve) insufficiency: Secondary | ICD-10-CM | POA: Diagnosis not present

## 2021-02-10 DIAGNOSIS — I2510 Atherosclerotic heart disease of native coronary artery without angina pectoris: Secondary | ICD-10-CM | POA: Diagnosis not present

## 2021-02-10 DIAGNOSIS — I10 Essential (primary) hypertension: Secondary | ICD-10-CM | POA: Diagnosis not present

## 2021-02-10 DIAGNOSIS — I2699 Other pulmonary embolism without acute cor pulmonale: Secondary | ICD-10-CM | POA: Diagnosis not present

## 2021-02-10 DIAGNOSIS — I340 Nonrheumatic mitral (valve) insufficiency: Secondary | ICD-10-CM | POA: Diagnosis not present

## 2021-02-10 DIAGNOSIS — E785 Hyperlipidemia, unspecified: Secondary | ICD-10-CM | POA: Diagnosis not present

## 2021-02-16 DIAGNOSIS — R7303 Prediabetes: Secondary | ICD-10-CM | POA: Diagnosis not present

## 2021-02-16 DIAGNOSIS — I10 Essential (primary) hypertension: Secondary | ICD-10-CM | POA: Diagnosis not present

## 2021-02-16 DIAGNOSIS — E785 Hyperlipidemia, unspecified: Secondary | ICD-10-CM | POA: Diagnosis not present

## 2021-02-18 ENCOUNTER — Other Ambulatory Visit: Payer: Self-pay | Admitting: Nurse Practitioner

## 2021-02-18 DIAGNOSIS — J181 Lobar pneumonia, unspecified organism: Secondary | ICD-10-CM

## 2021-02-24 DIAGNOSIS — R0602 Shortness of breath: Secondary | ICD-10-CM | POA: Diagnosis not present

## 2021-02-24 DIAGNOSIS — I2510 Atherosclerotic heart disease of native coronary artery without angina pectoris: Secondary | ICD-10-CM | POA: Diagnosis not present

## 2021-02-24 DIAGNOSIS — E785 Hyperlipidemia, unspecified: Secondary | ICD-10-CM | POA: Diagnosis not present

## 2021-02-24 DIAGNOSIS — I340 Nonrheumatic mitral (valve) insufficiency: Secondary | ICD-10-CM | POA: Diagnosis not present

## 2021-02-24 DIAGNOSIS — I351 Nonrheumatic aortic (valve) insufficiency: Secondary | ICD-10-CM | POA: Diagnosis not present

## 2021-02-24 DIAGNOSIS — I509 Heart failure, unspecified: Secondary | ICD-10-CM | POA: Diagnosis not present

## 2021-02-24 DIAGNOSIS — I10 Essential (primary) hypertension: Secondary | ICD-10-CM | POA: Diagnosis not present

## 2021-02-24 DIAGNOSIS — I2699 Other pulmonary embolism without acute cor pulmonale: Secondary | ICD-10-CM | POA: Diagnosis not present

## 2021-03-11 ENCOUNTER — Ambulatory Visit
Admission: RE | Admit: 2021-03-11 | Discharge: 2021-03-11 | Disposition: A | Discharge status: Home / Self Care | Payer: Medicare Other | Source: Ambulatory Visit | Attending: Nurse Practitioner | Admitting: Nurse Practitioner

## 2021-03-11 DIAGNOSIS — J181 Lobar pneumonia, unspecified organism: Secondary | ICD-10-CM | POA: Insufficient documentation

## 2021-07-06 ENCOUNTER — Other Ambulatory Visit: Payer: Self-pay | Admitting: Radiation Oncology

## 2021-07-13 ENCOUNTER — Other Ambulatory Visit: Payer: Self-pay | Admitting: *Deleted

## 2021-07-13 MED ORDER — TAMSULOSIN HCL 0.4 MG PO CAPS: ORAL_CAPSULE | ORAL | 11 refills | Status: DC

## 2021-08-22 DIAGNOSIS — I340 Nonrheumatic mitral (valve) insufficiency: Secondary | ICD-10-CM | POA: Diagnosis not present

## 2021-08-22 DIAGNOSIS — E785 Hyperlipidemia, unspecified: Secondary | ICD-10-CM | POA: Diagnosis not present

## 2021-08-22 DIAGNOSIS — R0602 Shortness of breath: Secondary | ICD-10-CM | POA: Diagnosis not present

## 2021-08-22 DIAGNOSIS — I2510 Atherosclerotic heart disease of native coronary artery without angina pectoris: Secondary | ICD-10-CM | POA: Diagnosis not present

## 2021-08-22 DIAGNOSIS — I509 Heart failure, unspecified: Secondary | ICD-10-CM | POA: Diagnosis not present

## 2021-08-22 DIAGNOSIS — I351 Nonrheumatic aortic (valve) insufficiency: Secondary | ICD-10-CM | POA: Diagnosis not present

## 2021-08-22 DIAGNOSIS — I10 Essential (primary) hypertension: Secondary | ICD-10-CM | POA: Diagnosis not present

## 2021-08-22 DIAGNOSIS — R55 Syncope and collapse: Secondary | ICD-10-CM | POA: Diagnosis not present

## 2021-08-22 DIAGNOSIS — R002 Palpitations: Secondary | ICD-10-CM | POA: Diagnosis not present

## 2021-08-30 DIAGNOSIS — Z0001 Encounter for general adult medical examination with abnormal findings: Secondary | ICD-10-CM | POA: Diagnosis not present

## 2021-08-30 DIAGNOSIS — R7303 Prediabetes: Secondary | ICD-10-CM | POA: Diagnosis not present

## 2021-08-30 DIAGNOSIS — I10 Essential (primary) hypertension: Secondary | ICD-10-CM | POA: Diagnosis not present

## 2021-08-30 DIAGNOSIS — E785 Hyperlipidemia, unspecified: Secondary | ICD-10-CM | POA: Diagnosis not present

## 2021-09-01 DIAGNOSIS — R7303 Prediabetes: Secondary | ICD-10-CM | POA: Diagnosis not present

## 2021-09-01 DIAGNOSIS — I2510 Atherosclerotic heart disease of native coronary artery without angina pectoris: Secondary | ICD-10-CM | POA: Diagnosis not present

## 2021-09-01 DIAGNOSIS — E785 Hyperlipidemia, unspecified: Secondary | ICD-10-CM | POA: Diagnosis not present

## 2021-09-20 DIAGNOSIS — M25551 Pain in right hip: Secondary | ICD-10-CM | POA: Diagnosis not present

## 2021-10-24 DIAGNOSIS — I2510 Atherosclerotic heart disease of native coronary artery without angina pectoris: Secondary | ICD-10-CM | POA: Diagnosis not present

## 2021-10-24 DIAGNOSIS — M47896 Other spondylosis, lumbar region: Secondary | ICD-10-CM | POA: Diagnosis not present

## 2021-10-24 DIAGNOSIS — M5416 Radiculopathy, lumbar region: Secondary | ICD-10-CM | POA: Diagnosis not present

## 2021-10-24 DIAGNOSIS — M533 Sacrococcygeal disorders, not elsewhere classified: Secondary | ICD-10-CM | POA: Diagnosis not present

## 2021-10-24 DIAGNOSIS — I2699 Other pulmonary embolism without acute cor pulmonale: Secondary | ICD-10-CM | POA: Diagnosis not present

## 2021-10-24 DIAGNOSIS — M13851 Other specified arthritis, right hip: Secondary | ICD-10-CM | POA: Diagnosis not present

## 2021-10-24 DIAGNOSIS — I340 Nonrheumatic mitral (valve) insufficiency: Secondary | ICD-10-CM | POA: Diagnosis not present

## 2021-10-24 DIAGNOSIS — M25551 Pain in right hip: Secondary | ICD-10-CM | POA: Diagnosis not present

## 2021-10-24 DIAGNOSIS — I351 Nonrheumatic aortic (valve) insufficiency: Secondary | ICD-10-CM | POA: Diagnosis not present

## 2021-10-24 DIAGNOSIS — R002 Palpitations: Secondary | ICD-10-CM | POA: Diagnosis not present

## 2021-10-24 DIAGNOSIS — I509 Heart failure, unspecified: Secondary | ICD-10-CM | POA: Diagnosis not present

## 2021-10-24 DIAGNOSIS — I10 Essential (primary) hypertension: Secondary | ICD-10-CM | POA: Diagnosis not present

## 2021-10-24 DIAGNOSIS — E119 Type 2 diabetes mellitus without complications: Secondary | ICD-10-CM | POA: Diagnosis not present

## 2021-10-24 DIAGNOSIS — M47816 Spondylosis without myelopathy or radiculopathy, lumbar region: Secondary | ICD-10-CM | POA: Diagnosis not present

## 2021-10-24 DIAGNOSIS — M545 Low back pain, unspecified: Secondary | ICD-10-CM | POA: Diagnosis not present

## 2021-10-24 DIAGNOSIS — E785 Hyperlipidemia, unspecified: Secondary | ICD-10-CM | POA: Diagnosis not present

## 2021-12-26 DIAGNOSIS — E785 Hyperlipidemia, unspecified: Secondary | ICD-10-CM | POA: Diagnosis not present

## 2021-12-26 DIAGNOSIS — F17200 Nicotine dependence, unspecified, uncomplicated: Secondary | ICD-10-CM | POA: Diagnosis not present

## 2021-12-26 DIAGNOSIS — I10 Essential (primary) hypertension: Secondary | ICD-10-CM | POA: Diagnosis not present

## 2021-12-26 DIAGNOSIS — I340 Nonrheumatic mitral (valve) insufficiency: Secondary | ICD-10-CM | POA: Diagnosis not present

## 2021-12-26 DIAGNOSIS — R0602 Shortness of breath: Secondary | ICD-10-CM | POA: Diagnosis not present

## 2021-12-26 DIAGNOSIS — R079 Chest pain, unspecified: Secondary | ICD-10-CM | POA: Diagnosis not present

## 2021-12-26 DIAGNOSIS — I2510 Atherosclerotic heart disease of native coronary artery without angina pectoris: Secondary | ICD-10-CM | POA: Diagnosis not present

## 2021-12-26 DIAGNOSIS — I351 Nonrheumatic aortic (valve) insufficiency: Secondary | ICD-10-CM | POA: Diagnosis not present

## 2021-12-26 DIAGNOSIS — I2699 Other pulmonary embolism without acute cor pulmonale: Secondary | ICD-10-CM | POA: Diagnosis not present

## 2021-12-26 DIAGNOSIS — R002 Palpitations: Secondary | ICD-10-CM | POA: Diagnosis not present

## 2021-12-26 DIAGNOSIS — I509 Heart failure, unspecified: Secondary | ICD-10-CM | POA: Diagnosis not present

## 2021-12-27 DIAGNOSIS — R072 Precordial pain: Secondary | ICD-10-CM | POA: Diagnosis not present

## 2021-12-30 DIAGNOSIS — F17200 Nicotine dependence, unspecified, uncomplicated: Secondary | ICD-10-CM | POA: Diagnosis not present

## 2021-12-30 DIAGNOSIS — I2699 Other pulmonary embolism without acute cor pulmonale: Secondary | ICD-10-CM | POA: Diagnosis not present

## 2021-12-30 DIAGNOSIS — I351 Nonrheumatic aortic (valve) insufficiency: Secondary | ICD-10-CM | POA: Diagnosis not present

## 2021-12-30 DIAGNOSIS — E785 Hyperlipidemia, unspecified: Secondary | ICD-10-CM | POA: Diagnosis not present

## 2021-12-30 DIAGNOSIS — R002 Palpitations: Secondary | ICD-10-CM | POA: Diagnosis not present

## 2021-12-30 DIAGNOSIS — I340 Nonrheumatic mitral (valve) insufficiency: Secondary | ICD-10-CM | POA: Diagnosis not present

## 2021-12-30 DIAGNOSIS — I10 Essential (primary) hypertension: Secondary | ICD-10-CM | POA: Diagnosis not present

## 2021-12-30 DIAGNOSIS — I509 Heart failure, unspecified: Secondary | ICD-10-CM | POA: Diagnosis not present

## 2021-12-30 DIAGNOSIS — I2510 Atherosclerotic heart disease of native coronary artery without angina pectoris: Secondary | ICD-10-CM | POA: Diagnosis not present

## 2022-01-02 DIAGNOSIS — E785 Hyperlipidemia, unspecified: Secondary | ICD-10-CM | POA: Diagnosis not present

## 2022-01-02 DIAGNOSIS — R7303 Prediabetes: Secondary | ICD-10-CM | POA: Diagnosis not present

## 2022-01-02 DIAGNOSIS — M79604 Pain in right leg: Secondary | ICD-10-CM | POA: Diagnosis not present

## 2022-01-02 DIAGNOSIS — K219 Gastro-esophageal reflux disease without esophagitis: Secondary | ICD-10-CM | POA: Diagnosis not present

## 2022-01-02 DIAGNOSIS — J441 Chronic obstructive pulmonary disease with (acute) exacerbation: Secondary | ICD-10-CM | POA: Diagnosis not present

## 2022-01-02 DIAGNOSIS — I509 Heart failure, unspecified: Secondary | ICD-10-CM | POA: Diagnosis not present

## 2022-03-13 DIAGNOSIS — I10 Essential (primary) hypertension: Secondary | ICD-10-CM | POA: Diagnosis not present

## 2022-03-13 DIAGNOSIS — R002 Palpitations: Secondary | ICD-10-CM | POA: Diagnosis not present

## 2022-03-13 DIAGNOSIS — I340 Nonrheumatic mitral (valve) insufficiency: Secondary | ICD-10-CM | POA: Diagnosis not present

## 2022-03-13 DIAGNOSIS — I351 Nonrheumatic aortic (valve) insufficiency: Secondary | ICD-10-CM | POA: Diagnosis not present

## 2022-03-13 DIAGNOSIS — I509 Heart failure, unspecified: Secondary | ICD-10-CM | POA: Diagnosis not present

## 2022-03-13 DIAGNOSIS — E785 Hyperlipidemia, unspecified: Secondary | ICD-10-CM | POA: Diagnosis not present

## 2022-03-13 DIAGNOSIS — R0602 Shortness of breath: Secondary | ICD-10-CM | POA: Diagnosis not present

## 2022-03-13 DIAGNOSIS — I2510 Atherosclerotic heart disease of native coronary artery without angina pectoris: Secondary | ICD-10-CM | POA: Diagnosis not present

## 2022-03-17 DIAGNOSIS — R0602 Shortness of breath: Secondary | ICD-10-CM | POA: Diagnosis not present

## 2022-03-21 DIAGNOSIS — I10 Essential (primary) hypertension: Secondary | ICD-10-CM | POA: Diagnosis not present

## 2022-03-21 DIAGNOSIS — R002 Palpitations: Secondary | ICD-10-CM | POA: Diagnosis not present

## 2022-03-21 DIAGNOSIS — I509 Heart failure, unspecified: Secondary | ICD-10-CM | POA: Diagnosis not present

## 2022-03-21 DIAGNOSIS — I4891 Unspecified atrial fibrillation: Secondary | ICD-10-CM | POA: Diagnosis not present

## 2022-03-21 DIAGNOSIS — I351 Nonrheumatic aortic (valve) insufficiency: Secondary | ICD-10-CM | POA: Diagnosis not present

## 2022-03-21 DIAGNOSIS — F17200 Nicotine dependence, unspecified, uncomplicated: Secondary | ICD-10-CM | POA: Diagnosis not present

## 2022-03-21 DIAGNOSIS — I2510 Atherosclerotic heart disease of native coronary artery without angina pectoris: Secondary | ICD-10-CM | POA: Diagnosis not present

## 2022-03-21 DIAGNOSIS — I340 Nonrheumatic mitral (valve) insufficiency: Secondary | ICD-10-CM | POA: Diagnosis not present

## 2022-03-21 DIAGNOSIS — E785 Hyperlipidemia, unspecified: Secondary | ICD-10-CM | POA: Diagnosis not present

## 2022-03-21 DIAGNOSIS — I2699 Other pulmonary embolism without acute cor pulmonale: Secondary | ICD-10-CM | POA: Diagnosis not present

## 2022-04-09 ENCOUNTER — Other Ambulatory Visit: Payer: Self-pay

## 2022-04-09 ENCOUNTER — Inpatient Hospital Stay (HOSPITAL_COMMUNITY)
Admission: EM | Admit: 2022-04-09 | Discharge: 2022-04-18 | DRG: 163 | Disposition: A | Discharge status: Skilled Nursing Facility | Payer: 59 | Attending: Internal Medicine | Admitting: Internal Medicine

## 2022-04-09 ENCOUNTER — Emergency Department (HOSPITAL_COMMUNITY): Payer: 59

## 2022-04-09 ENCOUNTER — Encounter (HOSPITAL_COMMUNITY): Payer: Self-pay

## 2022-04-09 DIAGNOSIS — Z85038 Personal history of other malignant neoplasm of large intestine: Secondary | ICD-10-CM

## 2022-04-09 DIAGNOSIS — Z87440 Personal history of urinary (tract) infections: Secondary | ICD-10-CM

## 2022-04-09 DIAGNOSIS — I2602 Saddle embolus of pulmonary artery with acute cor pulmonale: Principal | ICD-10-CM | POA: Diagnosis present

## 2022-04-09 DIAGNOSIS — I2609 Other pulmonary embolism with acute cor pulmonale: Secondary | ICD-10-CM | POA: Diagnosis not present

## 2022-04-09 DIAGNOSIS — I2699 Other pulmonary embolism without acute cor pulmonale: Secondary | ICD-10-CM | POA: Diagnosis not present

## 2022-04-09 DIAGNOSIS — Z888 Allergy status to other drugs, medicaments and biological substances status: Secondary | ICD-10-CM

## 2022-04-09 DIAGNOSIS — I70201 Unspecified atherosclerosis of native arteries of extremities, right leg: Secondary | ICD-10-CM | POA: Diagnosis present

## 2022-04-09 DIAGNOSIS — I5021 Acute systolic (congestive) heart failure: Secondary | ICD-10-CM | POA: Diagnosis not present

## 2022-04-09 DIAGNOSIS — I2782 Chronic pulmonary embolism: Secondary | ICD-10-CM | POA: Diagnosis present

## 2022-04-09 DIAGNOSIS — I5022 Chronic systolic (congestive) heart failure: Secondary | ICD-10-CM

## 2022-04-09 DIAGNOSIS — Z1152 Encounter for screening for COVID-19: Secondary | ICD-10-CM

## 2022-04-09 DIAGNOSIS — E872 Acidosis, unspecified: Secondary | ICD-10-CM | POA: Diagnosis not present

## 2022-04-09 DIAGNOSIS — Z8249 Family history of ischemic heart disease and other diseases of the circulatory system: Secondary | ICD-10-CM

## 2022-04-09 DIAGNOSIS — F17210 Nicotine dependence, cigarettes, uncomplicated: Secondary | ICD-10-CM | POA: Diagnosis present

## 2022-04-09 DIAGNOSIS — I959 Hypotension, unspecified: Secondary | ICD-10-CM | POA: Diagnosis not present

## 2022-04-09 DIAGNOSIS — Z8546 Personal history of malignant neoplasm of prostate: Secondary | ICD-10-CM

## 2022-04-09 DIAGNOSIS — R2689 Other abnormalities of gait and mobility: Secondary | ICD-10-CM | POA: Diagnosis not present

## 2022-04-09 DIAGNOSIS — R0602 Shortness of breath: Secondary | ICD-10-CM | POA: Diagnosis not present

## 2022-04-09 DIAGNOSIS — R531 Weakness: Secondary | ICD-10-CM | POA: Diagnosis not present

## 2022-04-09 DIAGNOSIS — J449 Chronic obstructive pulmonary disease, unspecified: Secondary | ICD-10-CM | POA: Diagnosis present

## 2022-04-09 DIAGNOSIS — J9601 Acute respiratory failure with hypoxia: Secondary | ICD-10-CM

## 2022-04-09 DIAGNOSIS — M25552 Pain in left hip: Secondary | ICD-10-CM | POA: Diagnosis present

## 2022-04-09 DIAGNOSIS — I110 Hypertensive heart disease with heart failure: Secondary | ICD-10-CM | POA: Diagnosis not present

## 2022-04-09 DIAGNOSIS — R262 Difficulty in walking, not elsewhere classified: Secondary | ICD-10-CM | POA: Diagnosis present

## 2022-04-09 DIAGNOSIS — F419 Anxiety disorder, unspecified: Secondary | ICD-10-CM | POA: Diagnosis present

## 2022-04-09 DIAGNOSIS — Z91138 Patient's unintentional underdosing of medication regimen for other reason: Secondary | ICD-10-CM

## 2022-04-09 DIAGNOSIS — E876 Hypokalemia: Secondary | ICD-10-CM

## 2022-04-09 DIAGNOSIS — E871 Hypo-osmolality and hyponatremia: Secondary | ICD-10-CM | POA: Diagnosis not present

## 2022-04-09 DIAGNOSIS — Z7901 Long term (current) use of anticoagulants: Secondary | ICD-10-CM

## 2022-04-09 DIAGNOSIS — Z87442 Personal history of urinary calculi: Secondary | ICD-10-CM

## 2022-04-09 DIAGNOSIS — I2721 Secondary pulmonary arterial hypertension: Secondary | ICD-10-CM | POA: Diagnosis present

## 2022-04-09 DIAGNOSIS — I82513 Chronic embolism and thrombosis of femoral vein, bilateral: Secondary | ICD-10-CM | POA: Diagnosis present

## 2022-04-09 DIAGNOSIS — I428 Other cardiomyopathies: Secondary | ICD-10-CM | POA: Diagnosis present

## 2022-04-09 DIAGNOSIS — Z79899 Other long term (current) drug therapy: Secondary | ICD-10-CM

## 2022-04-09 DIAGNOSIS — I270 Primary pulmonary hypertension: Secondary | ICD-10-CM | POA: Diagnosis not present

## 2022-04-09 DIAGNOSIS — Z6821 Body mass index (BMI) 21.0-21.9, adult: Secondary | ICD-10-CM

## 2022-04-09 DIAGNOSIS — Z95820 Peripheral vascular angioplasty status with implants and grafts: Secondary | ICD-10-CM | POA: Diagnosis not present

## 2022-04-09 DIAGNOSIS — Z5900 Homelessness unspecified: Secondary | ICD-10-CM

## 2022-04-09 DIAGNOSIS — K219 Gastro-esophageal reflux disease without esophagitis: Secondary | ICD-10-CM | POA: Diagnosis present

## 2022-04-09 DIAGNOSIS — J9691 Respiratory failure, unspecified with hypoxia: Secondary | ICD-10-CM | POA: Diagnosis not present

## 2022-04-09 DIAGNOSIS — Z9181 History of falling: Secondary | ICD-10-CM

## 2022-04-09 DIAGNOSIS — I480 Paroxysmal atrial fibrillation: Secondary | ICD-10-CM | POA: Diagnosis not present

## 2022-04-09 DIAGNOSIS — I82411 Acute embolism and thrombosis of right femoral vein: Secondary | ICD-10-CM | POA: Diagnosis present

## 2022-04-09 DIAGNOSIS — I5023 Acute on chronic systolic (congestive) heart failure: Secondary | ICD-10-CM | POA: Diagnosis not present

## 2022-04-09 DIAGNOSIS — Z85118 Personal history of other malignant neoplasm of bronchus and lung: Secondary | ICD-10-CM

## 2022-04-09 DIAGNOSIS — R0789 Other chest pain: Secondary | ICD-10-CM | POA: Diagnosis not present

## 2022-04-09 DIAGNOSIS — N400 Enlarged prostate without lower urinary tract symptoms: Secondary | ICD-10-CM | POA: Diagnosis present

## 2022-04-09 DIAGNOSIS — I82533 Chronic embolism and thrombosis of popliteal vein, bilateral: Secondary | ICD-10-CM | POA: Diagnosis present

## 2022-04-09 DIAGNOSIS — Z9049 Acquired absence of other specified parts of digestive tract: Secondary | ICD-10-CM

## 2022-04-09 DIAGNOSIS — T45516A Underdosing of anticoagulants, initial encounter: Secondary | ICD-10-CM | POA: Diagnosis present

## 2022-04-09 DIAGNOSIS — R911 Solitary pulmonary nodule: Secondary | ICD-10-CM | POA: Diagnosis present

## 2022-04-09 DIAGNOSIS — R278 Other lack of coordination: Secondary | ICD-10-CM | POA: Diagnosis not present

## 2022-04-09 DIAGNOSIS — I272 Pulmonary hypertension, unspecified: Secondary | ICD-10-CM

## 2022-04-09 DIAGNOSIS — R5383 Other fatigue: Secondary | ICD-10-CM | POA: Diagnosis not present

## 2022-04-09 DIAGNOSIS — R55 Syncope and collapse: Secondary | ICD-10-CM | POA: Diagnosis not present

## 2022-04-09 DIAGNOSIS — Z7984 Long term (current) use of oral hypoglycemic drugs: Secondary | ICD-10-CM

## 2022-04-09 DIAGNOSIS — I2510 Atherosclerotic heart disease of native coronary artery without angina pectoris: Secondary | ICD-10-CM | POA: Diagnosis present

## 2022-04-09 DIAGNOSIS — R0689 Other abnormalities of breathing: Secondary | ICD-10-CM | POA: Diagnosis not present

## 2022-04-09 DIAGNOSIS — R634 Abnormal weight loss: Secondary | ICD-10-CM | POA: Diagnosis present

## 2022-04-09 DIAGNOSIS — R1312 Dysphagia, oropharyngeal phase: Secondary | ICD-10-CM | POA: Diagnosis not present

## 2022-04-09 DIAGNOSIS — I252 Old myocardial infarction: Secondary | ICD-10-CM

## 2022-04-09 DIAGNOSIS — M1990 Unspecified osteoarthritis, unspecified site: Secondary | ICD-10-CM | POA: Diagnosis present

## 2022-04-09 DIAGNOSIS — G2581 Restless legs syndrome: Secondary | ICD-10-CM | POA: Diagnosis not present

## 2022-04-09 DIAGNOSIS — J9690 Respiratory failure, unspecified, unspecified whether with hypoxia or hypercapnia: Secondary | ICD-10-CM | POA: Diagnosis not present

## 2022-04-09 DIAGNOSIS — R000 Tachycardia, unspecified: Secondary | ICD-10-CM | POA: Diagnosis not present

## 2022-04-09 DIAGNOSIS — I5043 Acute on chronic combined systolic (congestive) and diastolic (congestive) heart failure: Secondary | ICD-10-CM

## 2022-04-09 DIAGNOSIS — Z743 Need for continuous supervision: Secondary | ICD-10-CM | POA: Diagnosis not present

## 2022-04-09 DIAGNOSIS — Z7401 Bed confinement status: Secondary | ICD-10-CM | POA: Diagnosis not present

## 2022-04-09 DIAGNOSIS — R0902 Hypoxemia: Secondary | ICD-10-CM | POA: Diagnosis not present

## 2022-04-09 DIAGNOSIS — I2693 Single subsegmental pulmonary embolism without acute cor pulmonale: Secondary | ICD-10-CM | POA: Diagnosis not present

## 2022-04-09 DIAGNOSIS — I50811 Acute right heart failure: Secondary | ICD-10-CM | POA: Diagnosis not present

## 2022-04-09 DIAGNOSIS — Z8042 Family history of malignant neoplasm of prostate: Secondary | ICD-10-CM

## 2022-04-09 DIAGNOSIS — M6281 Muscle weakness (generalized): Secondary | ICD-10-CM | POA: Diagnosis not present

## 2022-04-09 HISTORY — DX: Acute respiratory failure with hypoxia: J96.01

## 2022-04-09 HISTORY — DX: Hypokalemia: E87.6

## 2022-04-09 HISTORY — DX: Acidosis, unspecified: E87.20

## 2022-04-09 LAB — MAGNESIUM: Magnesium: 1.8 mg/dL (ref 1.7–2.4)

## 2022-04-09 LAB — I-STAT VENOUS BLOOD GAS, ED
Acid-base deficit: 9 mmol/L — ABNORMAL HIGH (ref 0.0–2.0)
Bicarbonate: 14.8 mmol/L — ABNORMAL LOW (ref 20.0–28.0)
Calcium, Ion: 1.03 mmol/L — ABNORMAL LOW (ref 1.15–1.40)
HCT: 33 % — ABNORMAL LOW (ref 39.0–52.0)
Hemoglobin: 11.2 g/dL — ABNORMAL LOW (ref 13.0–17.0)
O2 Saturation: 71 %
Potassium: 2.8 mmol/L — ABNORMAL LOW (ref 3.5–5.1)
Sodium: 145 mmol/L (ref 135–145)
TCO2: 16 mmol/L — ABNORMAL LOW (ref 22–32)
pCO2, Ven: 25.3 mmHg — ABNORMAL LOW (ref 44–60)
pH, Ven: 7.374 (ref 7.25–7.43)
pO2, Ven: 37 mmHg (ref 32–45)

## 2022-04-09 LAB — RESP PANEL BY RT-PCR (RSV, FLU A&B, COVID)  RVPGX2
Influenza A by PCR: NEGATIVE
Influenza B by PCR: NEGATIVE
Resp Syncytial Virus by PCR: NEGATIVE
SARS Coronavirus 2 by RT PCR: NEGATIVE

## 2022-04-09 LAB — COMPREHENSIVE METABOLIC PANEL WITH GFR
ALT: 23 U/L (ref 0–44)
AST: 31 U/L (ref 15–41)
Albumin: 3.4 g/dL — ABNORMAL LOW (ref 3.5–5.0)
Alkaline Phosphatase: 83 U/L (ref 38–126)
Anion gap: 12 (ref 5–15)
BUN: 12 mg/dL (ref 8–23)
CO2: 18 mmol/L — ABNORMAL LOW (ref 22–32)
Calcium: 8.8 mg/dL — ABNORMAL LOW (ref 8.9–10.3)
Chloride: 112 mmol/L — ABNORMAL HIGH (ref 98–111)
Creatinine, Ser: 1.07 mg/dL (ref 0.61–1.24)
GFR, Estimated: >60 mL/min
Glucose, Bld: 133 mg/dL — ABNORMAL HIGH (ref 70–99)
Potassium: 3.5 mmol/L (ref 3.5–5.1)
Sodium: 142 mmol/L (ref 135–145)
Total Bilirubin: 1.7 mg/dL — ABNORMAL HIGH (ref 0.3–1.2)
Total Protein: 6.4 g/dL — ABNORMAL LOW (ref 6.5–8.1)

## 2022-04-09 LAB — TROPONIN I (HIGH SENSITIVITY)
Troponin I (High Sensitivity): 231 ng/L (ref <18)
Troponin I (High Sensitivity): 232 ng/L (ref <18)

## 2022-04-09 LAB — CBC WITH DIFFERENTIAL/PLATELET
Abs Immature Granulocytes: 0.03 10*3/uL (ref 0.00–0.07)
Basophils Absolute: 0 10*3/uL (ref 0.0–0.1)
Basophils Relative: 1 %
Eosinophils Absolute: 0 10*3/uL (ref 0.0–0.5)
Eosinophils Relative: 0 %
HCT: 41.8 % (ref 39.0–52.0)
Hemoglobin: 13.8 g/dL (ref 13.0–17.0)
Immature Granulocytes: 0 %
Lymphocytes Relative: 11 %
Lymphs Abs: 1 10*3/uL (ref 0.7–4.0)
MCH: 31 pg (ref 26.0–34.0)
MCHC: 33 g/dL (ref 30.0–36.0)
MCV: 93.9 fL (ref 80.0–100.0)
Monocytes Absolute: 0.4 10*3/uL (ref 0.1–1.0)
Monocytes Relative: 4 %
Neutro Abs: 7.4 10*3/uL (ref 1.7–7.7)
Neutrophils Relative %: 84 %
Platelets: 162 10*3/uL (ref 150–400)
RBC: 4.45 MIL/uL (ref 4.22–5.81)
RDW: 14.1 % (ref 11.5–15.5)
WBC: 8.8 10*3/uL (ref 4.0–10.5)
nRBC: 0 % (ref 0.0–0.2)

## 2022-04-09 LAB — LACTIC ACID, PLASMA: Lactic Acid, Venous: 5.4 mmol/L (ref 0.5–1.9)

## 2022-04-09 LAB — DIGOXIN LEVEL: Digoxin Level: <0.2 ng/mL — ABNORMAL LOW (ref 0.8–2.0)

## 2022-04-09 LAB — HEPARIN LEVEL (UNFRACTIONATED): Heparin Unfractionated: <0.1 IU/mL — ABNORMAL LOW (ref 0.30–0.70)

## 2022-04-09 LAB — PROTIME-INR
INR: 1.4 — ABNORMAL HIGH (ref 0.8–1.2)
Prothrombin Time: 17 s — ABNORMAL HIGH (ref 11.4–15.2)

## 2022-04-09 LAB — BRAIN NATRIURETIC PEPTIDE: B Natriuretic Peptide: 537.6 pg/mL — ABNORMAL HIGH (ref 0.0–100.0)

## 2022-04-09 LAB — APTT: aPTT: 25 seconds (ref 24–36)

## 2022-04-09 MED ORDER — ASPIRIN 325 MG PO TABS
325.0000 mg | ORAL_TABLET | Freq: Every day | ORAL | Status: DC
  Administered 2022-04-09: 325 mg via ORAL
  Filled 2022-04-09: qty 1

## 2022-04-09 MED ORDER — POTASSIUM CHLORIDE CRYS ER 20 MEQ PO TBCR
60.0000 meq | EXTENDED_RELEASE_TABLET | Freq: Once | ORAL | Status: AC
  Administered 2022-04-10: 60 meq via ORAL
  Filled 2022-04-09: qty 3

## 2022-04-09 MED ORDER — ROSUVASTATIN CALCIUM 5 MG PO TABS
10.0000 mg | ORAL_TABLET | Freq: Every day | ORAL | Status: DC
  Administered 2022-04-10 – 2022-04-18 (×8): 10 mg via ORAL
  Filled 2022-04-09 (×8): qty 2

## 2022-04-09 MED ORDER — ALBUTEROL SULFATE (2.5 MG/3ML) 0.083% IN NEBU: 2.5000 mg | INHALATION_SOLUTION | RESPIRATORY_TRACT | Status: DC | PRN

## 2022-04-09 MED ORDER — LORAZEPAM 1 MG PO TABS
1.0000 mg | ORAL_TABLET | ORAL | Status: AC | PRN
  Administered 2022-04-09: 1 mg via ORAL
  Filled 2022-04-09: qty 1

## 2022-04-09 MED ORDER — DOCUSATE SODIUM 100 MG PO CAPS: 100.0000 mg | ORAL_CAPSULE | Freq: Two times a day (BID) | ORAL | Status: DC | PRN

## 2022-04-09 MED ORDER — HEPARIN (PORCINE) 25000 UT/250ML-% IV SOLN
1500.0000 [IU]/h | INTRAVENOUS | Status: DC
  Administered 2022-04-09: 1500 [IU]/h via INTRAVENOUS
  Filled 2022-04-09: qty 250

## 2022-04-09 MED ORDER — HEPARIN BOLUS VIA INFUSION
6000.0000 [IU] | Freq: Once | INTRAVENOUS | Status: AC
  Administered 2022-04-09: 6000 [IU] via INTRAVENOUS
  Filled 2022-04-09: qty 6000

## 2022-04-09 MED ORDER — METHYLPREDNISOLONE SODIUM SUCC 125 MG IJ SOLR
60.0000 mg | Freq: Once | INTRAMUSCULAR | Status: DC
  Filled 2022-04-09: qty 2

## 2022-04-09 MED ORDER — ALBUTEROL SULFATE (2.5 MG/3ML) 0.083% IN NEBU
2.5000 mg | INHALATION_SOLUTION | Freq: Once | RESPIRATORY_TRACT | Status: AC
  Administered 2022-04-09: 2.5 mg via RESPIRATORY_TRACT
  Filled 2022-04-09: qty 3

## 2022-04-09 MED ORDER — IOHEXOL 350 MG/ML SOLN
75.0000 mL | Freq: Once | INTRAVENOUS | Status: AC | PRN
  Administered 2022-04-09: 75 mL via INTRAVENOUS

## 2022-04-09 MED ORDER — POLYETHYLENE GLYCOL 3350 17 G PO PACK: 17.0000 g | PACK | Freq: Every day | ORAL | Status: DC | PRN

## 2022-04-09 NOTE — H&P (Signed)
NAME:  Bruce [name], MRN:  [id number], DOB:  06/24/1945, LOS: 0 ADMISSION DATE:  04/09/2022, CONSULTATION DATE:  04/09/22 REFERRING MD:  Armandina Gemma - EM, CHIEF COMPLAINT:  chest pain and dizziness    History of Present Illness:  77yo M PMH Possible Afib (on amio and reportedly on xarelto though pt is not exactly sure of the dx prompting these Rx) HFrEF, CAD, MI, COPD prior tobacco use, HTN presented to ED 2/4 with CC chest pain and dizziness. Reports he almost passed out the day before, has been SOB and had worse chest pain prompting ED presentation. Workup in ED revealed PE with R heart strain.  HDS but hypoxic, started on hep gtt   PCCM asked to admit in this setting   Pertinent  Medical History  Prostate cancer Tobacco use COPD ?Afib Anxiety HTN MI HFrEF   Significant Hospital Events: Including procedures, antibiotic start and stop dates in addition to other pertinent events   2/4 admit to ICU for submassive PE . CT also w incr size of previously seen pulm nodule (61m in 2020 now 661m   Interim History / Subjective:  On NRB but SpO2 100  Objective   Blood pressure (!) 121/90, pulse (!) 101, temperature 98.1 F (36.7 C), temperature source Oral, resp. rate (!) 23, height '6\' 1"'$  (1.854 m), weight 84.4 kg, SpO2 98 %.       No intake or output data in the 24 hours ending 04/09/22 2335 Filed Weights   04/09/22 1636  Weight: 84.4 kg    Examination: General: chronically ill elderly M NAD  HENT: NCAT pink mm  Lungs: Tachypnea. No accessory use. Clear  Cardiovascular: slight tachycardia. Cap refill brisk  Abdomen: soft ndnt  Extremities: no acute joint defomriyt  Neuro: AAOx3  GU: defer  Resolved Hospital Problem list     Assessment & Plan:   Submassive PE (possibly while on Xarelto) Acute respiratory failure with hypoxia due to PE COPD without exacerbation  LUL [id number] nodule  Lactic acidosis NAGMA Hypokalemia ?hx Afib (now sinus tach) Chronic systolic HF  Hx  prostate cancer  -trop, bnp, la elevated in setting of PE  -has had some hematuria and blood in stool recently, but cannot pinpoint when exactly (thinks within last month or 2)  -pt reports he is still taking his Xarelto, which is interesting. Does say that he sometimes misses a day or two but feels >75% compliance.  P -admit to ICU -cardiac monitoring -hep gtt  -titrate O2 for SpO2 > 92 -ECHO in AM  -pccm md to review nodule in AM -replace K -need a concrete med rec  -defer adding antihypertensives tonight, assess in AM pending hemodynamic stability     Best Practice (right click and "Reselect all SmartList Selections" daily)   Diet/type: clear liquids DVT prophylaxis: systemic heparin GI prophylaxis: N/A Lines: N/A Foley:  N/A Code Status:  DNR Last date of multidisciplinary goals of care discussion [ discussed code status 2/4, affirms DNR]  Labs   CBC: Recent Labs  Lab 04/09/22 1645 04/09/22 1805  WBC 8.8  --   NEUTROABS 7.4  --   HGB 13.8 11.2*  HCT 41.8 33.0*  MCV 93.9  --   PLT 162  --     Basic Metabolic Panel: Recent Labs  Lab 04/09/22 1645 04/09/22 1805  NA 142 145  K 3.5 2.8*  CL 112*  --   CO2 18*  --   GLUCOSE 133*  --   BUN  12  --   CREATININE 1.07  --   CALCIUM 8.8*  --   MG 1.8  --    GFR: Estimated Creatinine Clearance: 68.5 mL/min (by C-G formula based on SCr of 1.07 mg/dL). Recent Labs  Lab 04/09/22 1645 04/09/22 2200  WBC 8.8  --   LATICACIDVEN  --  5.4*    Liver Function Tests: Recent Labs  Lab 04/09/22 1645  AST 31  ALT 23  ALKPHOS 83  BILITOT 1.7*  PROT 6.4*  ALBUMIN 3.4*   No results for input(s): "LIPASE", "AMYLASE" in the last 168 hours. No results for input(s): "AMMONIA" in the last 168 hours.  ABG    Component Value Date/Time   PHART 7.299 (L) 10/06/2013 1603   PCO2ART 49.0 (H) 10/06/2013 1603   PO2ART 299.0 (H) 10/06/2013 1603   HCO3 14.8 (L) 04/09/2022 1805   TCO2 16 (L) 04/09/2022 1805   ACIDBASEDEF  9.0 (H) 04/09/2022 1805   O2SAT 71 04/09/2022 1805     Coagulation Profile: Recent Labs  Lab 04/09/22 1645  INR 1.4*    Cardiac Enzymes: No results for input(s): "CKTOTAL", "CKMB", "CKMBINDEX", "TROPONINI" in the last 168 hours.  HbA1C: Hgb A1c MFr Bld  Date/Time Value Ref Range Status  01/01/2019 06:15 AM 5.5 4.8 - 5.6 % Final    Comment:    (NOTE) Pre diabetes:          5.7%-6.4% Diabetes:              >6.4% Glycemic control for   <7.0% adults with diabetes     CBG: No results for input(s): "GLUCAP" in the last 168 hours.  Review of Systems:   Review of Systems  Constitutional: Negative.   HENT: Negative.    Respiratory:  Positive for shortness of breath. Negative for cough and hemoptysis.   Cardiovascular:  Positive for chest pain.  Gastrointestinal:  Positive for blood in stool. Negative for melena.  Genitourinary:  Positive for hematuria.  Musculoskeletal: Negative.   Skin: Negative.   Neurological:  Positive for dizziness.  Endo/Heme/Allergies: Negative.   Psychiatric/Behavioral: Negative.       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   Surgical History:   Past Surgical History:  Procedure Laterality Date   CARDIAC CATHETERIZATION     CHOLECYSTECTOMY     EXTERNAL FIXATION LEG Right 10/06/2013   Procedure: CLOSED REDUCTION RIGHT TIBIAL PLATEAU FRACTURE, EXTERNAL FIXATION RIGHT LEG, PLACEMENT OF WOUND VAC;  Surgeon: Rozanna Box, MD;  Location: [place];  Service: Orthopedics;  Laterality: Right;   FEMORAL-POPLITEAL BYPASS GRAFT Right 10/06/2013   Procedure: RIGHT POPLITEAL-POPLITEAL ARTERY BYPASS GRAFT;  Surgeon: Rosetta Posner, MD;  Location: [place];  Service: Vascular;  Laterality: Right;   FRACTURE SURGERY Right 2014   HARDWARE REMOVAL Right 12/02/2013   Procedure: REMOVAL EXTERNAL FIXATION RIGHT LEG ;  Surgeon: Rozanna Box, MD;  Location: [place];  Service: Orthopedics;  Laterality: Right;   HERNIA REPAIR Right 1990's   I & D EXTREMITY Right 10/09/2013   Procedure: IRRIGATION AND DEBRIDEMENT RIGHT LEG, CLOSURE  OF WOUNDS, PLACEMENT OF WOUND VAC ON EACH SIDE OF LEG;  Surgeon: Rozanna Box, MD;  Location: [place];  Service: Orthopedics;  Laterality: Right;   IR NEPHROSTOMY PLACEMENT LEFT  06/19/2016   IVC filter  2009   placed @ [facility]/ Removed in 2010.   IVC Filter Removed     LEFT HEART CATH AND CORONARY ANGIOGRAPHY Right 10/11/2018   Procedure: LEFT HEART CATH AND CORONARY ANGIOGRAPHY;  Surgeon: [name], MD;  Location: [place] [facility];  Service: Cardiovascular;  Laterality: Right;   ligament leg Left    NEPHROLITHOTOMY Left 06/19/2016   Procedure: NEPHROLITHOTOMY PERCUTANEOUS;  Surgeon: Hollice Espy, MD;  Location: [facility];  Service: Urology;  Laterality: Left;   ORIF TIBIA FRACTURE Right 12/29/2014   Procedure: OPEN REDUCTION INTERNAL FIXATION (ORIF) RIGHT TIBIA FRACTURE, RIA VS ICBG;  Surgeon: Altamese Albion, MD;  Location: [place];  Service: Orthopedics;  Laterality: Right;   RADIOACTIVE SEED IMPLANT N/A 09/12/2016   Procedure: RADIOACTIVE SEED IMPLANT/BRACHYTHERAPY IMPLANT;  Surgeon: Hollice Espy, MD;  Location: [facility];  Service: Urology;  Laterality: N/A;     Social History:   reports that he has been smoking. He has a 26.00 pack-year smoking history. He has never used smokeless tobacco. He reports that he does not drink alcohol and does not use drugs.   Family History:  His family history  includes Hypertension in his mother; Prostate cancer in his brother. There is no history of Mental illness.   Allergies Allergies  Allergen Reactions   Adhesive [Tape] Other (See Comments)    After right leg fracture surgery, pt developed a large blister where tape was applied to his right leg. OK to use paper tape.   Imdur [Isosorbide Dinitrate] Other (See Comments)    hallucinations   Singulair [Montelukast Sodium] Other (See Comments)    Hallucinations      Home Medications  Prior to Admission medications   Medication Sig Start Date End Date Taking? Authorizing Provider  albuterol (PROVENTIL HFA;VENTOLIN HFA) 108 (90 Base) MCG/ACT inhaler Inhale 2 puffs into the lungs every 6 (six) hours as needed for wheezing or shortness of breath. 04/30/18  Yes Pyreddy, Reatha Harps, MD  ALPRAZolam (XANAX) 0.25 MG tablet Take 0.25 mg by mouth daily as needed for anxiety. 02/20/20  Yes [provider]  amiodarone (PACERONE) 200 MG tablet Take 200-400 mg by mouth See admin instructions. Take 2 tablets by mouth twice a daily for 1 week, then 3 tablets for 1 week, then 1 tablet twice daily for 1 week until next appointment   Yes [provider]  carvedilol (COREG) 12.5 MG tablet Take 1 tablet (12.5 mg total) by mouth 2 (two) times daily. 01/02/19  Yes Hall, Carole N, DO  cetirizine (ZYRTEC) 10 MG tablet Take 10 mg by mouth daily.   Yes [provider]  digoxin (LANOXIN) 0.125 MG tablet Take 0.125 mg by mouth daily. 01/10/22  Yes [provider]  ENTRESTO 49-51 MG Take 1 tablet by mouth 2 (two) times daily. 11/03/21  Yes [provider]  FARXIGA 10 MG TABS tablet Take 10 mg by mouth daily. 11/03/21  Yes [provider]  fluticasone (FLONASE) 50 MCG/ACT nasal spray Place 1 spray into both nostrils daily. 04/12/20  Yes [provider]  furosemide (LASIX) 20 MG tablet Take 20 mg by mouth daily. 11/03/21  Yes [provider]  Multiple Vitamin  (MULTIVITAMIN) tablet Take 1 tablet by mouth daily. For Men   Yes [provider]  nitroGLYCERIN (NITROSTAT) 0.4 MG SL tablet Place 0.4 mg under the tongue every 5 (five) minutes as needed for chest pain.    Yes [provider]  Omega-3 Fatty Acids (FISH OIL PO) Take 1,000 mg by mouth every evening.    Yes [provider]  omeprazole (PRILOSEC) 40 MG capsule Take 40 mg by mouth daily.   Yes [provider]  ranolazine (RANEXA) 500 MG 12 hr tablet Take 500 mg by mouth 2 (two) times daily. 01/18/22  Yes [provider]  rivaroxaban (XARELTO) 20 MG TABS tablet Take 1 tablet (20 mg total) by mouth daily. 01/06/19  Yes Hall, Carole N, DO  rOPINIRole (REQUIP) 1 MG tablet Take 1 mg by mouth at bedtime. 11/04/18  Yes [provider]  rosuvastatin (CRESTOR) 10 MG tablet Take 10 mg by mouth daily. 01/10/22  Yes [provider]  spironolactone (ALDACTONE) 25 MG tablet Take 25 mg by mouth daily. 11/03/21  Yes [provider]  tamsulosin (FLOMAX) 0.4 MG CAPS capsule TAKE 1 CAPSULE BY MOUTH ONCE DAILY AFTERSUPPER Patient taking differently: Take 0.4 mg by mouth daily after supper. 07/13/21  Yes Noreene Filbert, MD     Critical care time: 40 min      CRITICAL CARE Performed by: Cristal Generous   Total critical care time: 40 minutes  Critical care time was exclusive of separately billable procedures and treating other patients. Critical care was necessary to treat or prevent imminent or life-threatening deterioration.  Critical care was time spent personally by me on the following activities: development of treatment plan with patient and/or surrogate as well as nursing, discussions with consultants, evaluation of patient's response to treatment, examination of patient, obtaining history from patient or surrogate, ordering and performing treatments and interventions, ordering and review of laboratory studies, ordering and review of  radiographic studies, pulse oximetry and re-evaluation of patient's condition.  Eliseo Gum MSN, AGACNP-BC [place] for pager  04/09/2022, 11:57 PM

## 2022-04-09 NOTE — ED Triage Notes (Signed)
Arives via EMS from home with SOB, Spo2 on EMS arrival 77% RA, placed on 15 L Nonrebreather, given 2x Albuterol TX, 0.5 mg atrovent, 125 mg solumedrol, Spo2 on arrival 91% 15 L Non rerebreather, Tachycardic, tachypnea,

## 2022-04-09 NOTE — ED Notes (Signed)
ED TO INPATIENT HANDOFF REPORT  ED Nurse Name and Phone #: Colletta [place] [phone]  S Name/Age/Gender Bruce [name] 77 y.o. male Room/Bed: 028C/028C  Code Status   Code Status: DNR  Home/SNF/Other Home Patient oriented to: self, place, time, and situation Is this baseline? Yes   Triage Complete: Triage complete  Chief Complaint Pulmonary embolism ([place]) [I26.99]  Triage Note Arives via EMS from home with SOB, Spo2 on EMS arrival 77% RA, placed on 15 L Nonrebreather, given 2x Albuterol TX, 0.5 mg atrovent, 125 mg solumedrol, Spo2 on arrival 91% 15 L Non rerebreather, Tachycardic, tachypnea,     Allergies Allergies  Allergen Reactions   Adhesive [Tape] Other (See Comments)    After right leg fracture surgery, pt developed a large blister where tape was applied to his right leg. OK to use paper tape.   Imdur [Isosorbide Dinitrate] Other (See Comments)    hallucinations   Singulair [Montelukast Sodium] Other (See Comments)    Hallucinations     Level of Care/Admitting Diagnosis ED Disposition     ED Disposition  Admit   Condition  --   [place]: [place] [[id number]]  Level of Care: ICU [6]  May admit patient to [facility] or [facility] if equivalent level of care is available:: No  Covid Evaluation: Confirmed COVID Negative  Diagnosis: Pulmonary embolism [facility]) [[id number]]  Admitting Physician: Audria Nine [[id number]]  Attending Physician: Audria Nine [[id number]]  Certification:: I certify this patient will need inpatient services for at least 2 midnights  Estimated Length of Stay: 5          B Medical/Surgery History Past Medical History:  Diagnosis Date   Anginal pain ([place])    Left side if chest ,NTG  relieves chaes apin 12/01/13   Anxiety    Arthritis    Cancer (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   UTI (urinary tract infection)    frequent UTI   Past Surgical History:  Procedure Laterality Date   CARDIAC CATHETERIZATION     CHOLECYSTECTOMY     EXTERNAL FIXATION LEG Right 10/06/2013   Procedure: CLOSED REDUCTION RIGHT TIBIAL PLATEAU FRACTURE, EXTERNAL FIXATION RIGHT LEG, PLACEMENT OF WOUND VAC;  Surgeon: Rozanna Box, MD;  Location: [place];  Service: Orthopedics;  Laterality: Right;   FEMORAL-POPLITEAL BYPASS GRAFT Right 10/06/2013   Procedure: RIGHT POPLITEAL-POPLITEAL ARTERY BYPASS GRAFT;  Surgeon: Rosetta Posner, MD;  Location: [place];  Service: Vascular;  Laterality: Right;   FRACTURE SURGERY Right 2014   HARDWARE REMOVAL Right 12/02/2013   Procedure: REMOVAL EXTERNAL FIXATION RIGHT LEG ;  Surgeon: Rozanna Box, MD;  Location: [place];  Service: Orthopedics;  Laterality: Right;   HERNIA REPAIR Right 1990's   I & D EXTREMITY Right 10/09/2013   Procedure: IRRIGATION AND DEBRIDEMENT RIGHT  LEG, CLOSURE  OF WOUNDS, PLACEMENT OF WOUND VAC ON EACH SIDE OF LEG;  Surgeon: Rozanna Box, MD;  Location: [place];  Service: Orthopedics;  Laterality: Right;   IR NEPHROSTOMY PLACEMENT LEFT  06/19/2016   IVC filter  2009   placed @ [facility]/ Removed in 2010.   IVC Filter Removed     LEFT HEART CATH AND CORONARY ANGIOGRAPHY Right 10/11/2018   Procedure: LEFT HEART CATH AND CORONARY ANGIOGRAPHY;  Surgeon: [name], MD;  Location: [place] [facility];  Service: Cardiovascular;  Laterality: Right;   ligament leg Left    NEPHROLITHOTOMY Left 06/19/2016   Procedure: NEPHROLITHOTOMY  PERCUTANEOUS;  Surgeon: Hollice Espy, MD;  Location: [facility];  Service: Urology;  Laterality: Left;   ORIF TIBIA FRACTURE Right 12/29/2014   Procedure: OPEN REDUCTION INTERNAL FIXATION (ORIF) RIGHT TIBIA FRACTURE, RIA VS ICBG;  Surgeon: Altamese Bradner, MD;  Location: [place];  Service: Orthopedics;  Laterality: Right;   RADIOACTIVE SEED IMPLANT N/A 09/12/2016   Procedure: RADIOACTIVE SEED IMPLANT/BRACHYTHERAPY IMPLANT;  Surgeon: Hollice Espy, MD;  Location: [facility];  Service: Urology;  Laterality: N/A;     A IV Location/Drains/Wounds Patient Lines/Drains/Airways Status     Active Line/Drains/Airways     Name Placement date Placement time Site Days   Peripheral IV 04/09/22 18 G 1" Left Antecubital 04/09/22  1631  Antecubital  less than 1   Peripheral IV 04/09/22 20 G Right Antecubital 04/09/22  2031  Antecubital  less than 1            Intake/Output Last 24 hours No intake or output data in the 24 hours ending 04/09/22 2344  Labs/Imaging Results for orders placed or performed during the hospital encounter of 04/09/22 (from the past 48 hour(s))  Magnesium     Status: None   Collection Time: 04/09/22  4:45 PM  Result Value Ref Range   Magnesium 1.8 1.7 - 2.4 mg/dL    Comment: Performed at [facility] [address] [place], [place] [address]  Brain natriuretic peptide (order ONLY if patient c/o SOB)     Status: Abnormal   Collection Time: 04/09/22  4:45 PM  Result Value Ref Range   B Natriuretic Peptide 537.6 (H) 0.0 - 100.0 pg/mL    Comment: Performed at [facility] [address] [place], [place] [address]  Comprehensive metabolic panel     Status: Abnormal   Collection Time: 04/09/22  4:45 PM  Result Value Ref Range   Sodium 142 135 - 145 mmol/L   Potassium 3.5 3.5 - 5.1 mmol/L   Chloride 112 (H) 98 - 111 mmol/L   CO2 18 (L) 22 - 32 mmol/L   Glucose, Bld 133 (H) 70 - 99 mg/dL    Comment: Glucose reference range applies only to samples taken after  fasting for at least 8 hours.   BUN 12 8 - 23 mg/dL   Creatinine, Ser 1.07 0.61 - 1.24 mg/dL   Calcium 8.8 (L) 8.9 - 10.3 mg/dL   Total Protein 6.4 (L) 6.5 - 8.1 g/dL   Albumin 3.4 (L) 3.5 - 5.0 g/dL   AST 31 15 - 41 U/L   ALT 23 0 - 44 U/L   Alkaline Phosphatase 83 38 - 126 U/L   Total Bilirubin 1.7 (H) 0.3 - 1.2 mg/dL   GFR, Estimated >60 >60 mL/min    Comment: (NOTE) Calculated using the CKD-EPI Creatinine Equation (2021)    Anion gap 12 5 -  15    Comment: Performed at [facility], [place] [address], [place], [place] [address]  Digoxin level     Status: Abnormal   Collection Time: 04/09/22  4:45 PM  Result Value Ref Range   Digoxin Level <0.2 (L) 0.8 - 2.0 ng/mL    Comment: RESULT CONFIRMED BY MANUAL DILUTION Performed at [facility], [place] [address], [place], [place] [address]   CBC with Differential/Platelet     Status: None   Collection Time: 04/09/22  4:45 PM  Result Value Ref Range   WBC 8.8 4.0 - 10.5 K/uL   RBC 4.45 4.22 - 5.81 MIL/uL   Hemoglobin 13.8 13.0 - 17.0 g/dL   HCT 41.8 39.0 - 52.0 %   MCV 93.9 80.0 - 100.0 fL   MCH 31.0 26.0 - 34.0 pg   MCHC 33.0 30.0 - 36.0 g/dL   RDW 14.1 11.5 - 15.5 %   Platelets 162 150 - 400 K/uL   nRBC 0.0 0.0 - 0.2 %   Neutrophils Relative % 84 %   Neutro Abs 7.4 1.7 - 7.7 K/uL   Lymphocytes Relative 11 %   Lymphs Abs 1.0 0.7 - 4.0 K/uL   Monocytes Relative 4 %   Monocytes Absolute 0.4 0.1 - 1.0 K/uL   Eosinophils Relative 0 %   Eosinophils Absolute 0.0 0.0 - 0.5 K/uL   Basophils Relative 1 %   Basophils Absolute 0.0 0.0 - 0.1 K/uL   Immature Granulocytes 0 %   Abs Immature Granulocytes 0.03 0.00 - 0.07 K/uL    Comment: Performed at [facility], [address], [place], [place] [address]  Protime-INR     Status: Abnormal   Collection Time: 04/09/22  4:45 PM  Result Value Ref Range   Prothrombin Time 17.0 (H) 11.4 - 15.2 seconds   INR 1.4 (H) 0.8 - 1.2    Comment: (NOTE) INR goal varies based on device  and disease states. Performed at [facility], [place] [address], [place], [place] [address]   Troponin I (High Sensitivity)     Status: Abnormal   Collection Time: 04/09/22  4:45 PM  Result Value Ref Range   Troponin I (High Sensitivity) 231 (HH) <18 ng/L    Comment: CRITICAL RESULT CALLED TO, READ BACK BY AND VERIFIED WITH S,LARIVIERE RN '@[id number]'$  04/09/22 E,[place] (NOTE) Elevated high sensitivity troponin I (hsTnI) values and significant  changes across serial measurements may suggest ACS but many other  chronic and acute conditions are known to elevate hsTnI results.  Refer to the "Links" section for chest pain algorithms and additional  guidance. Performed at [facility], [place] [address], [place], [place] [address]   Resp panel by RT-PCR (RSV, Flu A&B, Covid) Anterior Nasal Swab     Status: None   Collection Time: 04/09/22  4:57 PM   Specimen: Anterior Nasal Swab  Result Value Ref Range   SARS Coronavirus 2 by RT PCR NEGATIVE NEGATIVE   Influenza A by PCR NEGATIVE NEGATIVE   Influenza B by PCR NEGATIVE NEGATIVE    Comment: (NOTE) The Xpert Xpress SARS-CoV-2/FLU/RSV plus assay is intended as an aid in the diagnosis of influenza from Nasopharyngeal swab specimens and should not be used as a sole basis for treatment. Nasal washings and aspirates are unacceptable for Xpert Xpress SARS-CoV-2/FLU/RSV testing.  Fact Sheet for Patients: EntrepreneurPulse.com.au  Fact Sheet for Healthcare Providers: IncredibleEmployment.be  This test is not yet approved or cleared by the  [place] Architectural technologist and has been authorized for detection and/or diagnosis of SARS-CoV-2 by FDA under an Print production planner (EUA). This EUA will remain in effect (meaning this test can be used) for the duration of the COVID-19 declaration under Section 564(b)(1) of the Act, 21 U.S.C. section 360bbb-3(b)(1), unless the authorization is terminated or revoked.      Resp Syncytial Virus by PCR NEGATIVE NEGATIVE    Comment: (NOTE) Fact Sheet for Patients: EntrepreneurPulse.com.au  Fact Sheet for Healthcare Providers: IncredibleEmployment.be  This test is not yet approved or cleared by the [place] FDA and has been authorized for detection and/or diagnosis of SARS-CoV-2 by FDA under an Emergency Use Authorization (EUA). This EUA will remain in effect (meaning this test can be used) for the duration of the COVID-19 declaration under Section 564(b)(1) of the Act, 21 U.S.C. section 360bbb-3(b)(1), unless the authorization is terminated or revoked.  Performed at [facility], [place] [address] [place], [place] [address]   I-Stat venous blood gas, [facility], [facility], [facility])     Status: Abnormal   Collection Time: 04/09/22  6:05 PM  Result Value Ref Range   pH, Ven 7.374 7.25 - 7.43   pCO2, Ven 25.3 (L) 44 - 60 mmHg   pO2, Ven 37 32 - 45 mmHg   Bicarbonate 14.8 (L) 20.0 - 28.0 mmol/L   TCO2 16 (L) 22 - 32 mmol/L   O2 Saturation 71 %   Acid-base deficit 9.0 (H) 0.0 - 2.0 mmol/L   Sodium 145 135 - 145 mmol/L   Potassium 2.8 (L) 3.5 - 5.1 mmol/L   Calcium, Ion 1.03 (L) 1.15 - 1.40 mmol/L   HCT 33.0 (L) 39.0 - 52.0 %   Hemoglobin 11.2 (L) 13.0 - 17.0 g/dL   Sample type VENOUS    Comment NOTIFIED PHYSICIAN   Troponin I (High Sensitivity)     Status: Abnormal   Collection Time: 04/09/22  6:38 PM  Result Value Ref Range   Troponin I (High Sensitivity) 232 (HH) <18 ng/L    Comment: CRITICAL VALUE NOTED. VALUE IS CONSISTENT WITH PREVIOUSLY REPORTED/CALLED VALUE (NOTE) Elevated high sensitivity troponin I (hsTnI) values and significant  changes across serial measurements may suggest ACS but many other  chronic and acute conditions are known to elevate hsTnI results.  Refer to the "Links" section for chest pain algorithms and additional  guidance. Performed at [facility], [place] [address], [place],  [place] [address]   Heparin level (unfractionated)     Status: Abnormal   Collection Time: 04/09/22  7:35 PM  Result Value Ref Range   Heparin Unfractionated <0.10 (L) 0.30 - 0.70 IU/mL    Comment: (NOTE) The clinical reportable range upper limit is being lowered to >1.10 to align with the FDA approved guidance for the current laboratory assay.  If heparin results are below expected values, and patient dosage has  been confirmed, suggest follow up testing of antithrombin III levels. Performed at [facility], [place] [address], [place], [place] [address]   APTT     Status: None   Collection Time: 04/09/22  7:35 PM  Result Value Ref Range   aPTT 25 24 - 36 seconds    Comment: Performed at [facility] [address], [place], [place] [address]  Lactic acid, plasma     Status: Abnormal   Collection Time: 04/09/22 10:00 PM  Result Value Ref Range   Lactic Acid, Venous 5.4 (HH) 0.5 - 1.9  mmol/L    Comment: CRITICAL RESULT CALLED TO, READ BACK BY AND VERIFIED WITH Amado Nash RN 04/09/22 2309 Wiliam Ke Performed at [facility], [place] [address], [place], [place] [address]    CT Angio Chest PE W and/or Wo Contrast  Result Date: 04/09/2022 CLINICAL DATA:  Short of breath, hypoxia EXAM: CT ANGIOGRAPHY CHEST WITH CONTRAST TECHNIQUE: Multidetector CT imaging of the chest was performed using the standard protocol during bolus administration of intravenous contrast. Multiplanar CT image reconstructions and MIPs were obtained to evaluate the vascular anatomy. RADIATION DOSE REDUCTION: This exam was performed according to the departmental dose-optimization program which includes automated exposure control, adjustment of the mA and/or kV according to patient size and/or use of iterative reconstruction technique. CONTRAST:  42m OMNIPAQUE IOHEXOL 350 MG/ML SOLN COMPARISON:  04/09/2022, 05/02/2020 FINDINGS: Cardiovascular: This is a technically adequate evaluation of the pulmonary  vasculature. There are bilateral central and segmental pulmonary emboli, with large clot burden primarily within the right main pulmonary artery. There is evidence of right heart strain, with the RV/LV ratio measuring 1.16. No pericardial effusion. Normal caliber of the thoracic aorta. Atherosclerosis of the aorta and coronary vasculature. Mediastinum/Nodes: No enlarged mediastinal, hilar, or axillary lymph nodes. Thyroid gland, trachea, and esophagus demonstrate no significant findings. Lungs/Pleura: Background emphysema and scattered areas of scarring are stable. No acute airspace disease, effusion, or pneumothorax. Central airways are patent. 6 mm ground-glass nodule is again seen within the left lobe reference image 88/6, increased since prior study where this had measured approximately 4 mm by my measurement. No new pulmonary nodules or masses. Upper Abdomen: No acute abnormality. Musculoskeletal: No acute or destructive bony lesions. Reconstructed images demonstrate no additional findings. Review of the MIP images confirms the above findings. IMPRESSION: 1. Extensive bilateral central and segmental pulmonary emboli, with CT evidence of right heart strain (RV/LV Ratio = 1.16) consistent with at least submassive (intermediate risk) PE. The presence of right heart strain has been associated with an increased risk of morbidity and mortality. Please refer to the "Code PE Focused" order set in EPIC. 2. 6 mm left upper lobe ground-glass nodule, increased since prior exam from 2 years ago where this had measured approximately 4 mm by my measurement. Adenocarcinoma cannot be excluded given increase in size, and thoracic surgery consultation could be considered. If this area is not resected, CT chest follow-up in 2 years is recommended. This recommendation follows the consensus statement: Guidelines for Management of Incidental Pulmonary Nodules Detected on CT Images: From the Fleischner Society 2017; Radiology 2017;  284:228-243. 3. Aortic Atherosclerosis (ICD10-I70.0) and Emphysema (ICD10-J43.9). Critical Value/emergent results were called by telephone at the time of interpretation on 04/09/2022 at 8:20 pm to [facility], who verbally acknowledged these results. Electronically Signed   By: MRanda NgoM.D.   On: 04/09/2022 20:25   DG Chest Portable 1 View  Result Date: 04/09/2022 CLINICAL DATA:  Short of breath EXAM: PORTABLE CHEST 1 VIEW COMPARISON:  03/11/2021 FINDINGS: Two frontal views of the chest demonstrate a stable cardiac silhouette. Chronic prominence of the central pulmonary vasculature. No acute airspace disease, effusion, or pneumothorax. Mild background scarring unchanged. No acute bony abnormality. IMPRESSION: 1. No acute intrathoracic process. Electronically Signed   By: MRanda NgoM.D.   On: 04/09/2022 17:21    Pending Labs Unresulted Labs (From admission, onward)     Start     Ordered   04/11/22 0500  Heparin level (unfractionated)  Daily,   R  04/09/22 1914   04/11/22 0500  APTT  Daily,   R      04/09/22 1914   04/10/22 0500  CBC  Tomorrow morning,   R        04/09/22 2334   04/10/22 6160  Basic metabolic panel  Tomorrow morning,   R        04/09/22 2334   04/10/22 0430  Heparin level (unfractionated)  Once-Timed,   URGENT        04/09/22 2034   04/10/22 0430  APTT  Once-Timed,   STAT        04/09/22 2034   04/09/22 2123  Lactic acid, plasma  Now then every 2 hours,   R      04/09/22 2122            Vitals/Pain Today's Vitals   04/09/22 1900 04/09/22 2024 04/09/22 2100 04/09/22 2210  BP: 122/88 (!) 150/98 121/86 (!) 121/90  Pulse: (!) 110 (!) 109 (!) 105 (!) 101  Resp: (!) 30 (!) 32 (!) 24 (!) 23  Temp:  98.1 F (36.7 C)    TempSrc:  Oral    SpO2: 95% 94% 97% 98%  Weight:      Height:        Isolation Precautions Airborne and Contact precautions  Medications Medications  aspirin tablet 325 mg (325 mg Oral Given 04/09/22 1741)  heparin ADULT  infusion 100 units/mL (25000 units/277m) (1,500 Units/hr Intravenous New Bag/Given 04/09/22 2029)  docusate sodium (COLACE) capsule 100 mg (has no administration in time range)  polyethylene glycol (MIRALAX / GLYCOLAX) packet 17 g (has no administration in time range)  albuterol (PROVENTIL) (2.5 MG/3ML) 0.083% nebulizer solution 2.5 mg (2.5 mg Nebulization Given 04/09/22 1652)  LORazepam (ATIVAN) tablet 1 mg (1 mg Oral Given 04/09/22 2029)  iohexol (OMNIPAQUE) 350 MG/ML injection 75 mL (75 mLs Intravenous Contrast Given 04/09/22 2010)  heparin bolus via infusion 6,000 Units (6,000 Units Intravenous Bolus from Bag 04/09/22 2038)    Mobility walks     Focused Assessments     R Recommendations: See Admitting Provider Note  Report given to:   Additional Notes:

## 2022-04-09 NOTE — ED Notes (Addendum)
MD lawsing notified of lactic 5.4

## 2022-04-09 NOTE — ED Provider Notes (Signed)
[facility] Provider Note   CSN: [id number] Arrival date & time: 04/09/22  1622     History  Chief Complaint  Patient presents with   Near Syncope   Shortness of Breath    No 02 at home 15 L Non rebreather now    Bruce [name] is a 77 y.o. male.   Near Syncope Associated symptoms include chest pain and shortness of breath.  Shortness of Breath Associated symptoms: chest pain      77 year old male with a medical history significant for CAD, MI, PVD, COPD, CHF, reportedly on Xarelto presenting to the emergency department with shortness of breath and hypoxia.  The patient states that for the last day or so he has had a cough and shortness of breath.  EMS was called out due to worsening dyspnea and he was found to be 77% on room air and subsequently placed on a 15 L nonrebreather.  He was administered 2 of albuterol and 0.5 of Atrovent and 125 mg of Solu-Medrol.  The patient endorses left-sided chest pain, described as a pressure with associated left-sided chest wall discomfort, worse with movement.  Better with rest.  He denies any fevers or chills.  He arrived to the emergency department afebrile, tachycardic and tachypneic heart rate in the 120s, respiratory rate in the 30s, on a nonrebreather saturating 89 to 91%.  Home Medications Prior to Admission medications   Medication Sig Start Date End Date Taking? Authorizing Provider  albuterol (PROVENTIL HFA;VENTOLIN HFA) 108 (90 Base) MCG/ACT inhaler Inhale 2 puffs into the lungs every 6 (six) hours as needed for wheezing or shortness of breath. 04/30/18  Yes Pyreddy, Reatha Harps, MD  ALPRAZolam (XANAX) 0.25 MG tablet Take 0.25 mg by mouth daily as needed for anxiety. 02/20/20  Yes [provider]  amiodarone (PACERONE) 200 MG tablet Take 200-400 mg by mouth See admin instructions. Take 2 tablets by mouth twice a daily for 1 week, then 3 tablets for 1 week, then 1 tablet twice daily for 1  week until next appointment   Yes [provider]  carvedilol (COREG) 12.5 MG tablet Take 1 tablet (12.5 mg total) by mouth 2 (two) times daily. 01/02/19  Yes Hall, Carole N, DO  cetirizine (ZYRTEC) 10 MG tablet Take 10 mg by mouth daily.   Yes [provider]  digoxin (LANOXIN) 0.125 MG tablet Take 0.125 mg by mouth daily. 01/10/22  Yes [provider]  ENTRESTO 49-51 MG Take 1 tablet by mouth 2 (two) times daily. 11/03/21  Yes [provider]  FARXIGA 10 MG TABS tablet Take 10 mg by mouth daily. 11/03/21  Yes [provider]  fluticasone (FLONASE) 50 MCG/ACT nasal spray Place 1 spray into both nostrils daily. 04/12/20  Yes [provider]  furosemide (LASIX) 20 MG tablet Take 20 mg by mouth daily. 11/03/21  Yes [provider]  Multiple Vitamin (MULTIVITAMIN) tablet Take 1 tablet by mouth daily. For Men   Yes [provider]  nitroGLYCERIN (NITROSTAT) 0.4 MG SL tablet Place 0.4 mg under the tongue every 5 (five) minutes as needed for chest pain.    Yes [provider]  Omega-3 Fatty Acids (FISH OIL PO) Take 1,000 mg by mouth every evening.    Yes [provider]  omeprazole (PRILOSEC) 40 MG capsule Take 40 mg by mouth daily.   Yes [provider]  ranolazine (RANEXA) 500 MG 12 hr tablet Take 500 mg by mouth 2 (  two) times daily. 01/18/22  Yes [provider]  rivaroxaban (XARELTO) 20 MG TABS tablet Take 1 tablet (20 mg total) by mouth daily. 01/06/19  Yes Hall, Carole N, DO  rOPINIRole (REQUIP) 1 MG tablet Take 1 mg by mouth at bedtime. 11/04/18  Yes [provider]  rosuvastatin (CRESTOR) 10 MG tablet Take 10 mg by mouth daily. 01/10/22  Yes [provider]  spironolactone (ALDACTONE) 25 MG tablet Take 25 mg by mouth daily. 11/03/21  Yes [provider]  tamsulosin (FLOMAX) 0.4 MG CAPS capsule TAKE 1 CAPSULE BY MOUTH ONCE DAILY AFTERSUPPER Patient taking differently: Take  0.4 mg by mouth daily after supper. 07/13/21  Yes Chrystal, Eulas Post, MD      Allergies    Adhesive [tape], Imdur [isosorbide dinitrate], and Singulair [montelukast sodium]    Review of Systems   Review of Systems  Respiratory:  Positive for shortness of breath.   Cardiovascular:  Positive for chest pain and near-syncope.  All other systems reviewed and are negative.   Physical Exam Updated Vital Signs BP (!) 121/90   Pulse (!) 101   Temp 98.1 F (36.7 C) (Oral)   Resp (!) 23   Ht '6\' 1"'$  (1.854 m)   Wt 84.4 kg   SpO2 98%   BMI 24.54 kg/m  Physical Exam Vitals and nursing note reviewed.  Constitutional:      General: He is not in acute distress.    Appearance: He is well-developed.  HENT:     Head: Normocephalic and atraumatic.  Eyes:     Conjunctiva/sclera: Conjunctivae normal.  Cardiovascular:     Rate and Rhythm: Normal rate and regular rhythm.     Heart sounds: No murmur heard. Pulmonary:     Effort: Tachypnea and respiratory distress present.     Breath sounds: Examination of the left-upper field reveals wheezing. Wheezing present.  Chest:     Comments: Left-sided chest wall tenderness to palpation, no rash Abdominal:     Palpations: Abdomen is soft.     Tenderness: There is no abdominal tenderness.  Musculoskeletal:        General: No swelling.     Cervical back: Neck supple.     Right lower leg: No edema.     Left lower leg: No edema.  Skin:    General: Skin is warm and dry.     Capillary Refill: Capillary refill takes less than 2 seconds.  Neurological:     Mental Status: He is alert.  Psychiatric:        Mood and Affect: Mood normal.     ED Results / Procedures / Treatments   Labs (all labs ordered are listed, but only abnormal results are displayed) Labs Reviewed  BRAIN NATRIURETIC PEPTIDE - Abnormal; Notable for the following components:      Result Value   B Natriuretic Peptide 537.6 (*)    All other components within normal limits   COMPREHENSIVE METABOLIC PANEL - Abnormal; Notable for the following components:   Chloride 112 (*)    CO2 18 (*)    Glucose, Bld 133 (*)    Calcium 8.8 (*)    Total Protein 6.4 (*)    Albumin 3.4 (*)    Total Bilirubin 1.7 (*)    All other components within normal limits  DIGOXIN LEVEL - Abnormal; Notable for the following components:   Digoxin Level <0.2 (*)    All other components within normal limits  PROTIME-INR - Abnormal; Notable for the following  components:   Prothrombin Time 17.0 (*)    INR 1.4 (*)    All other components within normal limits  HEPARIN LEVEL (UNFRACTIONATED) - Abnormal; Notable for the following components:   Heparin Unfractionated <0.10 (*)    All other components within normal limits  LACTIC ACID, PLASMA - Abnormal; Notable for the following components:   Lactic Acid, Venous 5.4 (*)    All other components within normal limits  I-STAT VENOUS BLOOD GAS, ED - Abnormal; Notable for the following components:   pCO2, Ven 25.3 (*)    Bicarbonate 14.8 (*)    TCO2 16 (*)    Acid-base deficit 9.0 (*)    Potassium 2.8 (*)    Calcium, Ion 1.03 (*)    HCT 33.0 (*)    Hemoglobin 11.2 (*)    All other components within normal limits  TROPONIN I (HIGH SENSITIVITY) - Abnormal; Notable for the following components:   Troponin I (High Sensitivity) 231 (*)    All other components within normal limits  TROPONIN I (HIGH SENSITIVITY) - Abnormal; Notable for the following components:   Troponin I (High Sensitivity) 232 (*)    All other components within normal limits  RESP PANEL BY RT-PCR (RSV, FLU A&B, COVID)  RVPGX2  MAGNESIUM  CBC WITH DIFFERENTIAL/PLATELET  APTT  HEPARIN LEVEL (UNFRACTIONATED)  APTT  LACTIC ACID, PLASMA    EKG EKG Interpretation  Date/Time:  Sunday April 09 2022 17:44:50 EST Ventricular Rate:  117 PR Interval:  141 QRS Duration: 99 QT Interval:  332 QTC Calculation: 464 R Axis:   91 Text Interpretation: Sinus tachycardia  Ventricular premature complex Right axis deviation Borderline repolarization abnormality Confirmed by Regan Lemming ([id number]) on 04/09/2022 8:03:56 PM  Radiology CT Angio Chest PE W and/or Wo Contrast  Result Date: 04/09/2022 CLINICAL DATA:  Short of breath, hypoxia EXAM: CT ANGIOGRAPHY CHEST WITH CONTRAST TECHNIQUE: Multidetector CT imaging of the chest was performed using the standard protocol during bolus administration of intravenous contrast. Multiplanar CT image reconstructions and MIPs were obtained to evaluate the vascular anatomy. RADIATION DOSE REDUCTION: This exam was performed according to the departmental dose-optimization program which includes automated exposure control, adjustment of the mA and/or kV according to patient size and/or use of iterative reconstruction technique. CONTRAST:  63m OMNIPAQUE IOHEXOL 350 MG/ML SOLN COMPARISON:  04/09/2022, 05/02/2020 FINDINGS: Cardiovascular: This is a technically adequate evaluation of the pulmonary vasculature. There are bilateral central and segmental pulmonary emboli, with large clot burden primarily within the right main pulmonary artery. There is evidence of right heart strain, with the RV/LV ratio measuring 1.16. No pericardial effusion. Normal caliber of the thoracic aorta. Atherosclerosis of the aorta and coronary vasculature. Mediastinum/Nodes: No enlarged mediastinal, hilar, or axillary lymph nodes. Thyroid gland, trachea, and esophagus demonstrate no significant findings. Lungs/Pleura: Background emphysema and scattered areas of scarring are stable. No acute airspace disease, effusion, or pneumothorax. Central airways are patent. 6 mm ground-glass nodule is again seen within the left lobe reference image 88/6, increased since prior study where this had measured approximately 4 mm by my measurement. No new pulmonary nodules or masses. Upper Abdomen: No acute abnormality. Musculoskeletal: No acute or destructive bony lesions. Reconstructed images  demonstrate no additional findings. Review of the MIP images confirms the above findings. IMPRESSION: 1. Extensive bilateral central and segmental pulmonary emboli, with CT evidence of right heart strain (RV/LV Ratio = 1.16) consistent with at least submassive (intermediate risk) PE. The presence of right heart strain has been associated with an  increased risk of morbidity and mortality. Please refer to the "Code PE Focused" order set in EPIC. 2. 6 mm left upper lobe ground-glass nodule, increased since prior exam from 2 years ago where this had measured approximately 4 mm by my measurement. Adenocarcinoma cannot be excluded given increase in size, and thoracic surgery consultation could be considered. If this area is not resected, CT chest follow-up in 2 years is recommended. This recommendation follows the consensus statement: Guidelines for Management of Incidental Pulmonary Nodules Detected on CT Images: From the Fleischner Society 2017; Radiology 2017; 284:228-243. 3. Aortic Atherosclerosis (ICD10-I70.0) and Emphysema (ICD10-J43.9). Critical Value/emergent results were called by telephone at the time of interpretation on 04/09/2022 at 8:20 pm to provider [facility] , who verbally acknowledged these results. Electronically Signed   By: Randa Ngo M.D.   On: 04/09/2022 20:25   DG Chest Portable 1 View  Result Date: 04/09/2022 CLINICAL DATA:  Short of breath EXAM: PORTABLE CHEST 1 VIEW COMPARISON:  03/11/2021 FINDINGS: Two frontal views of the chest demonstrate a stable cardiac silhouette. Chronic prominence of the central pulmonary vasculature. No acute airspace disease, effusion, or pneumothorax. Mild background scarring unchanged. No acute bony abnormality. IMPRESSION: 1. No acute intrathoracic process. Electronically Signed   By: Randa Ngo M.D.   On: 04/09/2022 17:21    Procedures .Critical Care  Performed by: Regan Lemming, MD Authorized by: Regan Lemming, MD   Critical care provider  statement:    Critical care time (minutes):  45   Critical care was necessary to treat or prevent imminent or life-threatening deterioration of the following conditions:  Respiratory failure   Critical care was time spent personally by me on the following activities:  Development of treatment plan with patient or surrogate, discussions with consultants, evaluation of patient's response to treatment, examination of patient, ordering and review of laboratory studies, ordering and review of radiographic studies, ordering and performing treatments and interventions, pulse oximetry, re-evaluation of patient's condition and review of old charts   Care discussed with: admitting provider       Medications Ordered in ED Medications  aspirin tablet 325 mg (325 mg Oral Given 04/09/22 1741)  heparin ADULT infusion 100 units/mL (25000 units/295m) (1,500 Units/hr Intravenous New Bag/Given 04/09/22 2029)  albuterol (PROVENTIL) (2.5 MG/3ML) 0.083% nebulizer solution 2.5 mg (2.5 mg Nebulization Given 04/09/22 1652)  LORazepam (ATIVAN) tablet 1 mg (1 mg Oral Given 04/09/22 2029)  iohexol (OMNIPAQUE) 350 MG/ML injection 75 mL (75 mLs Intravenous Contrast Given 04/09/22 2010)  heparin bolus via infusion 6,000 Units (6,000 Units Intravenous Bolus from Bag 04/09/22 2038)    ED Course/ Medical Decision Making/ A&P Clinical Course as of 04/09/22 2324  Sun Apr 09, 2022  1737 Troponin I (High Sensitivity)(!!): 231 [JL]  1819 B Natriuretic Peptide(!): 537.6 [JL]  1848 INR(!): 1.4 [JL]  2324 Lactic Acid, Venous(!!): 5.4 [JL]  2324 Troponin I (High Sensitivity)(!!): 232 [JL]    Clinical Course User Index [JL] LRegan Lemming MD                             Medical Decision Making Amount and/or Complexity of Data Reviewed Labs: ordered. Decision-making details documented in ED Course. Radiology: ordered.  Risk OTC drugs. Prescription drug management. Decision regarding hospitalization.     77year old male with a  medical history significant for CAD, MI, PVD, COPD, CHF, reportedly on Xarelto presenting to the emergency department with shortness of breath and hypoxia.  The patient states that for the last day or so he has had a cough and shortness of breath.  EMS was called out due to worsening dyspnea and he was found to be 77% on room air and subsequently placed on a 15 L nonrebreather.  He was administered 2 of albuterol and 0.5 of Atrovent and 125 mg of Solu-Medrol.  The patient endorses left-sided chest pain, described as a pressure with associated left-sided chest wall discomfort, worse with movement.  Better with rest.  He denies any fevers or chills.  He arrived to the emergency department afebrile, tachycardic and tachypneic heart rate in the 120s, respiratory rate in the 30s, on a nonrebreather saturating 89 to 91%.  Differential diagnosis includes ACS, PE, pneumonia, CHF exacerbation, COVID-19, influenza, pneumothorax.  Initial EKG was performed revealed sinus tachycardia, PVC present, no evidence for STEMI.  Initial chest x-ray revealed no active disease.  Patient's physical exam on arrival revealed mild wheezing, sinus tachycardia noted, significant tachypnea noted.  Patient had some left-sided chest wall tenderness.  No rash.  Initial VBG revealed metabolic acidosis with respiratory compensation with a bicarbonate of 14.8, pCO2 of 25, pH of 7.37.  Initial troponin was elevated to 31, repeat troponin 2 hours later was flat at 232, digoxin level was normal, BNP was elevated to 538, magnesium was normal, INR was mildly elevated at 1.4, CMP without significant electrolyte abnormality, evidence of an non-anion gap acidosis with a bicarbonate of 18, anion gap of 12. Lactic acid was elevated to 5.4.  Given the concern for ACS versus PE, the patient was started on empiric heparin therapy.  A CT angiogram was obtained and revealed  large submassive PE with right heart strain: IMPRESSION:  1. Extensive bilateral  central and segmental pulmonary emboli, with  CT evidence of right heart strain (RV/LV Ratio = 1.16) consistent  with at least submassive (intermediate risk) PE. The presence of  right heart strain has been associated with an increased risk of  morbidity and mortality. Please refer to the "Code PE Focused" order  set in EPIC.  2. 6 mm left upper lobe ground-glass nodule, increased since prior  exam from 2 years ago where this had measured approximately 4 mm by  my measurement. Adenocarcinoma cannot be excluded given increase in  size, and thoracic surgery consultation could be considered. If this  area is not resected, CT chest follow-up in 2 years is recommended.  This recommendation follows the consensus statement: Guidelines for  Management of Incidental Pulmonary Nodules Detected on CT Images:  From the Fleischner Society 2017; Radiology 2017; 284:228-243.    3. Aortic Atherosclerosis (ICD10-I70.0) and Emphysema (ICD10-J43.9).    Critical Value/emergent results were called by telephone at the time  of interpretation on 04/09/2022 at 8:20 pm to provider [facility] ,  who verbally acknowledged these results.   Discussed the care of the patient with Dr. Ruthann Cancer of pulmonary critical care who will admit the patient for close monitoring in the ICU given his respiratory status.  Patient remained hemodynamically stable, tachycardic and mildly tachypneic, on a heparin drip following a bolus at time of admission. IR consult placed given high clot burden and evidence of right heart strain. Pt subsequently admitted to the ICU in critical but stable condition.   Final Clinical Impression(s) / ED Diagnoses Final diagnoses:  Other acute pulmonary embolism with acute cor pulmonale (HCC)  Acute respiratory failure with hypoxia ([place])    Rx / DC Orders ED Discharge Orders  None         Regan Lemming, MD 04/09/22 2324    Regan Lemming, MD 04/10/22 8301    Regan Lemming,  MD 04/10/22 [phone]

## 2022-04-09 NOTE — Progress Notes (Addendum)
ANTICOAGULATION CONSULT NOTE - Initial Consult  Pharmacy Consult for Heparin Indication: pulmonary embolus  Allergies  Allergen Reactions   Adhesive [Tape] Other (See Comments)    After right leg fracture surgery, pt developed a large blister where tape was applied to his right leg. OK to use paper tape.   Imdur [Isosorbide Dinitrate] Other (See Comments)    hallucinations   Singulair [Montelukast Sodium] Other (See Comments)    Hallucinations     Patient Measurements: Height: '6\' 1"'$  (185.4 cm) Weight: 84.4 kg (186 lb) IBW/kg (Calculated) : 79.9 Heparin Dosing Weight: 84.4 kg  Vital Signs: Temp: 97.9 F (36.6 C) (02/04 1636) Temp Source: Oral (02/04 1636) BP: 143/85 (02/04 1800) Pulse Rate: 110 (02/04 1845)  Labs: Recent Labs    04/09/22 1645 04/09/22 1805  HGB 13.8 11.2*  HCT 41.8 33.0*  PLT 162  --   LABPROT 17.0*  --   INR 1.4*  --   CREATININE 1.07  --   TROPONINIHS 231*  --     Estimated Creatinine Clearance: 68.5 mL/min (by C-G formula based on SCr of 1.07 mg/dL).   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   UTI (urinary tract infection)    frequent UTI    Medications:  (Not in a hospital admission)  Scheduled:   aspirin  325 mg Oral Daily    Infusions:   heparin     PRN:   Assessment: 48 yom presenting with near syncope and SOB. Heparin per pharmacy consult placed for pulmonary embolus.  Patient is on xarelto prior to arrival. Last dose 2/3AM. Will require aPTT monitoring due to likely falsely high anti-Xa level secondary to DOAC use.  Hgb 11.2; plt 162 PT/INR 17/1.4  Goal of Therapy:  Heparin level 0.3-0.7 units/ml aPTT 66-102 seconds Monitor platelets by anticoagulation protocol: Yes   Plan:  No initial heparin bolus Start heparin infusion at 1500 units/hr Check aPTT & anti-Xa level in 8 hours and daily while on heparin Continue to monitor via aPTT until levels are correlated Continue to monitor H&H and platelets  Addendum: 8:34 PM 04/09/22 >>baseline HL and aPTT do not indicate patient taking Xarelto  >>discussed with EDP will give 6000 unit IV heparin bolus  Lorelei Pont, PharmD, BCPS 04/09/2022 7:15 PM ED Clinical Pharmacist -  [phone]

## 2022-04-10 ENCOUNTER — Inpatient Hospital Stay (HOSPITAL_COMMUNITY): Payer: 59

## 2022-04-10 DIAGNOSIS — E872 Acidosis, unspecified: Secondary | ICD-10-CM | POA: Diagnosis not present

## 2022-04-10 DIAGNOSIS — I2699 Other pulmonary embolism without acute cor pulmonale: Secondary | ICD-10-CM | POA: Diagnosis not present

## 2022-04-10 DIAGNOSIS — J9601 Acute respiratory failure with hypoxia: Secondary | ICD-10-CM | POA: Diagnosis not present

## 2022-04-10 DIAGNOSIS — I2693 Single subsegmental pulmonary embolism without acute cor pulmonale: Secondary | ICD-10-CM

## 2022-04-10 DIAGNOSIS — E876 Hypokalemia: Secondary | ICD-10-CM | POA: Diagnosis not present

## 2022-04-10 HISTORY — PX: IR THROMBECT PRIM MECH INIT (INCLU) MOD SED: IMG2297

## 2022-04-10 HISTORY — PX: IR US GUIDE VASC ACCESS RIGHT: IMG2390

## 2022-04-10 HISTORY — PX: IR ANGIOGRAM PULMONARY RIGHT SELECTIVE: IMG663

## 2022-04-10 HISTORY — PX: IR ANGIOGRAM SELECTIVE EACH ADDITIONAL VESSEL: IMG667

## 2022-04-10 LAB — CBC
HCT: 39.4 % (ref 39.0–52.0)
Hemoglobin: 13.3 g/dL (ref 13.0–17.0)
MCH: 31 pg (ref 26.0–34.0)
MCHC: 33.8 g/dL (ref 30.0–36.0)
MCV: 91.8 fL (ref 80.0–100.0)
Platelets: 154 10*3/uL (ref 150–400)
RBC: 4.29 MIL/uL (ref 4.22–5.81)
RDW: 14.2 % (ref 11.5–15.5)
WBC: 7.4 10*3/uL (ref 4.0–10.5)
nRBC: 0 % (ref 0.0–0.2)

## 2022-04-10 LAB — ECHOCARDIOGRAM COMPLETE
Height: 73 in
S' Lateral: 4.8 cm
Single Plane A4C EF: 22.4 %
Weight: 2574.97 oz

## 2022-04-10 LAB — BASIC METABOLIC PANEL
Anion gap: 11 (ref 5–15)
Anion gap: 11 (ref 5–15)
BUN: 15 mg/dL (ref 8–23)
BUN: 17 mg/dL (ref 8–23)
CO2: 19 mmol/L — ABNORMAL LOW (ref 22–32)
CO2: 16 mmol/L — ABNORMAL LOW (ref 22–32)
Calcium: 9.6 mg/dL (ref 8.9–10.3)
Calcium: 9.4 mg/dL (ref 8.9–10.3)
Chloride: 110 mmol/L (ref 98–111)
Chloride: 108 mmol/L (ref 98–111)
Creatinine, Ser: 0.97 mg/dL (ref 0.61–1.24)
Creatinine, Ser: 0.98 mg/dL (ref 0.61–1.24)
GFR, Estimated: >60 mL/min (ref >60)
GFR, Estimated: >60 mL/min (ref >60)
Glucose, Bld: 206 mg/dL — ABNORMAL HIGH (ref 70–99)
Glucose, Bld: 164 mg/dL — ABNORMAL HIGH (ref 70–99)
Potassium: 4.8 mmol/L (ref 3.5–5.1)
Potassium: 4.4 mmol/L (ref 3.5–5.1)
Sodium: 140 mmol/L (ref 135–145)
Sodium: 135 mmol/L (ref 135–145)

## 2022-04-10 LAB — BRAIN NATRIURETIC PEPTIDE: B Natriuretic Peptide: 1012.6 pg/mL — ABNORMAL HIGH (ref 0.0–100.0)

## 2022-04-10 LAB — HEPARIN LEVEL (UNFRACTIONATED)
Heparin Unfractionated: 1.01 IU/mL — ABNORMAL HIGH (ref 0.30–0.70)
Heparin Unfractionated: 0.92 IU/mL — ABNORMAL HIGH (ref 0.30–0.70)
Heparin Unfractionated: 1.03 IU/mL — ABNORMAL HIGH (ref 0.30–0.70)
Heparin Unfractionated: 0.62 IU/mL (ref 0.30–0.70)

## 2022-04-10 LAB — APTT
aPTT: 139 seconds — ABNORMAL HIGH (ref 24–36)
aPTT: 100 seconds — ABNORMAL HIGH (ref 24–36)

## 2022-04-10 LAB — LACTIC ACID, PLASMA
Lactic Acid, Venous: 3.5 mmol/L (ref 0.5–1.9)
Lactic Acid, Venous: 2.4 mmol/L (ref 0.5–1.9)

## 2022-04-10 LAB — TROPONIN I (HIGH SENSITIVITY): Troponin I (High Sensitivity): 187 ng/L (ref <18)

## 2022-04-10 LAB — MRSA NEXT GEN BY PCR, NASAL: MRSA by PCR Next Gen: NOT DETECTED

## 2022-04-10 MED ORDER — LIDOCAINE-EPINEPHRINE 1 %-1:100000 IJ SOLN
INTRAMUSCULAR | Status: AC
  Administered 2022-04-10: 10 mL
  Filled 2022-04-10: qty 1

## 2022-04-10 MED ORDER — TAMSULOSIN HCL 0.4 MG PO CAPS
0.4000 mg | ORAL_CAPSULE | Freq: Every day | ORAL | Status: DC
  Administered 2022-04-10 – 2022-04-13 (×4): 0.4 mg via ORAL
  Filled 2022-04-10 (×4): qty 1

## 2022-04-10 MED ORDER — ORAL CARE MOUTH RINSE
15.0000 mL | OROMUCOSAL | Status: DC
  Administered 2022-04-10 – 2022-04-12 (×6): 15 mL via OROMUCOSAL

## 2022-04-10 MED ORDER — FENTANYL CITRATE (PF) 100 MCG/2ML IJ SOLN
INTRAMUSCULAR | Status: AC | PRN
  Administered 2022-04-10: 50 ug via INTRAVENOUS
  Administered 2022-04-10: 25 ug via INTRAVENOUS
  Administered 2022-04-10: 50 ug via INTRAVENOUS

## 2022-04-10 MED ORDER — IOHEXOL 300 MG/ML  SOLN
150.0000 mL | Freq: Once | INTRAMUSCULAR | Status: AC | PRN
  Administered 2022-04-10: 75 mL via INTRAVENOUS

## 2022-04-10 MED ORDER — CHLORHEXIDINE GLUCONATE CLOTH 2 % EX PADS
6.0000 | MEDICATED_PAD | Freq: Every day | CUTANEOUS | Status: DC
  Administered 2022-04-10 – 2022-04-11 (×3): 6 via TOPICAL

## 2022-04-10 MED ORDER — PANTOPRAZOLE SODIUM 40 MG PO TBEC
40.0000 mg | DELAYED_RELEASE_TABLET | Freq: Every day | ORAL | Status: DC
  Administered 2022-04-10 – 2022-04-18 (×8): 40 mg via ORAL
  Filled 2022-04-10 (×8): qty 1

## 2022-04-10 MED ORDER — HEPARIN (PORCINE) 25000 UT/250ML-% IV SOLN
1350.0000 [IU]/h | INTRAVENOUS | Status: DC
  Administered 2022-04-10 – 2022-04-11 (×2): 1350 [IU]/h via INTRAVENOUS
  Filled 2022-04-10 (×2): qty 250

## 2022-04-10 MED ORDER — ACETAMINOPHEN 325 MG PO TABS
650.0000 mg | ORAL_TABLET | Freq: Four times a day (QID) | ORAL | Status: DC | PRN
  Administered 2022-04-10 – 2022-04-17 (×8): 650 mg via ORAL
  Filled 2022-04-10 (×8): qty 2

## 2022-04-10 MED ORDER — MIDAZOLAM HCL 2 MG/2ML IJ SOLN
INTRAMUSCULAR | Status: AC
  Filled 2022-04-10: qty 4

## 2022-04-10 MED ORDER — ALPRAZOLAM 0.25 MG PO TABS
0.2500 mg | ORAL_TABLET | Freq: Every evening | ORAL | Status: DC | PRN
  Administered 2022-04-10 – 2022-04-17 (×3): 0.25 mg via ORAL
  Filled 2022-04-10 (×3): qty 1

## 2022-04-10 MED ORDER — SPIRONOLACTONE 25 MG PO TABS
25.0000 mg | ORAL_TABLET | Freq: Every day | ORAL | Status: DC
  Administered 2022-04-10 – 2022-04-14 (×4): 25 mg via ORAL
  Filled 2022-04-10 (×4): qty 1

## 2022-04-10 MED ORDER — UMECLIDINIUM-VILANTEROL 62.5-25 MCG/ACT IN AEPB
1.0000 | INHALATION_SPRAY | Freq: Every day | RESPIRATORY_TRACT | Status: DC
  Administered 2022-04-11 – 2022-04-17 (×6): 1 via RESPIRATORY_TRACT
  Filled 2022-04-10: qty 14

## 2022-04-10 MED ORDER — AMIODARONE HCL 200 MG PO TABS
200.0000 mg | ORAL_TABLET | Freq: Every day | ORAL | Status: DC
  Administered 2022-04-10 – 2022-04-12 (×3): 200 mg via ORAL
  Filled 2022-04-10 (×3): qty 1

## 2022-04-10 MED ORDER — HEPARIN SODIUM (PORCINE) 1000 UNIT/ML IJ SOLN
INTRAMUSCULAR | Status: AC
  Filled 2022-04-10: qty 10

## 2022-04-10 MED ORDER — ROPINIROLE HCL 1 MG PO TABS
1.0000 mg | ORAL_TABLET | Freq: Every day | ORAL | Status: DC
  Administered 2022-04-10 – 2022-04-17 (×9): 1 mg via ORAL
  Filled 2022-04-10 (×9): qty 1

## 2022-04-10 MED ORDER — DIGOXIN 125 MCG PO TABS
0.1250 mg | ORAL_TABLET | Freq: Every day | ORAL | Status: DC
  Administered 2022-04-10 – 2022-04-18 (×7): 0.125 mg via ORAL
  Filled 2022-04-10 (×7): qty 1

## 2022-04-10 MED ORDER — ORAL CARE MOUTH RINSE: 15.0000 mL | OROMUCOSAL | Status: DC | PRN

## 2022-04-10 MED ORDER — FENTANYL CITRATE (PF) 100 MCG/2ML IJ SOLN
INTRAMUSCULAR | Status: AC
  Filled 2022-04-10: qty 4

## 2022-04-10 MED ORDER — SACUBITRIL-VALSARTAN 24-26 MG PO TABS
1.0000 | ORAL_TABLET | Freq: Two times a day (BID) | ORAL | Status: DC
  Administered 2022-04-10 – 2022-04-14 (×9): 1 via ORAL
  Filled 2022-04-10 (×10): qty 1

## 2022-04-10 MED ORDER — MIDAZOLAM HCL 2 MG/2ML IJ SOLN
INTRAMUSCULAR | Status: AC | PRN
  Administered 2022-04-10: 1 mg via INTRAVENOUS

## 2022-04-10 MED ORDER — HEPARIN SODIUM (PORCINE) 1000 UNIT/ML IJ SOLN
INTRAMUSCULAR | Status: AC | PRN
  Administered 2022-04-10: 2000 [IU] via INTRAVENOUS

## 2022-04-10 NOTE — H&P (Signed)
NAME:  Bruce [name], MRN:  [id number], DOB:  February 17, 1946, LOS: 1 ADMISSION DATE:  04/09/2022, CONSULTATION DATE:  04/09/22 REFERRING MD:  Armandina Gemma - EM, CHIEF COMPLAINT:  chest pain and dizziness    History of Present Illness:  77yo M PMH Possible Afib (on amio and reportedly on xarelto though pt is not exactly sure of the dx prompting these Rx) HFrEF, CAD, MI, COPD prior tobacco use, HTN presented to ED 2/4 with CC chest pain and dizziness. Reports he almost passed out the day before, has been SOB and had worse chest pain prompting ED presentation. Workup in ED revealed PE with R heart strain.  HDS but hypoxic, started on hep gtt   PCCM asked to admit in this setting   Pertinent  Medical History  Prostate cancer Tobacco use COPD ?Afib Anxiety HTN MI HFrEF   Significant Hospital Events: Including procedures, antibiotic start and stop dates in addition to other pertinent events   2/4 admit to ICU for submassive PE . CT also w incr size of previously seen pulm nodule (95m in 2020 now 641m   Interim History / Subjective:  Reports only missing 1 dose of Xarelto per week.   Objective   Blood pressure (!) 137/92, pulse 100, temperature 97.9 F (36.6 C), temperature source Axillary, resp. rate (!) 28, height '6\' 1"'$  (1.854 m), weight 73 kg, SpO2 (!) 88 %.        Intake/Output Summary (Last 24 hours) at 04/10/2022 0813 Last data filed at 04/10/2022 0700 Gross per 24 hour  Intake 213.39 ml  Output 425 ml  Net -211.61 ml   Filed Weights   04/09/22 1636 04/10/22 0408  Weight: 84.4 kg 73 kg    Examination: General: ill appearing man sitting EOB HENT: /AT, eyes anicteric  Lungs: tachypnea, conversational dyspnea Cardiovascular: tachycardic, irreg rhythm Abdomen: soft, NT Extremities: no cyanosis or clubbing Neuro: awake alert, answering questions appropriately Derm: warm, dry  BNP 537.6 Trop 231> 232 LA 5.4>3.5 Bicarb 19 BUN 15 Cr 0.97  Resolved Hospital Problem list      Assessment & Plan:   Submassive PE, likely noncompliant with Xarelto-- high risk PE with syncope, baseline HFrEF, lactic acidosis.  Acute respiratory failure with hypoxia due to PE -supplemental O2 to maintain SpO2 >90% -Discussed case with IR-- we both agree this patient requires more intervention than heparin. He has appropriate clot burden for mechanical thrombectomy. Planning for this morning. NPO for now. Could be a lytics candidate since his med rec reports he has last taken xarelto on 2/1. -repeat LA, BNP, trop -needs life-long AC; recommend switching to Eliquis and improved compliance  COPD without exacerbation  -Anoro while admitted -albuterol PRN  LUL 5m69mG lung nodule, enlarging  -needs OP follow up; would   Lactic acidosis -repeat level today  Chronic HFrEF; baseline EF 40-45% History of paroxysmal Afib -resume PTA Entresto (reduced dose), spironolactone, amiodarone, & digoxin -hold coreg  -resume statin -monitor on tele -hold farxiga  BPH -tamsulosin  RLS -requip QHS  GERD -PPI (PTA med)  Hypokalemia, resolved -monitor -replete PRN  Chronic HFrEF; baseline EF 40-45% Hx prostate cancer   Anxiety -resume PTA xanax PRN     Best Practice (right click and "Reselect all SmartList Selections" daily)   Diet/type: clear liquids DVT prophylaxis: systemic heparin GI prophylaxis: PPI Lines: N/A Foley:  N/A Code Status:  DNR Last date of multidisciplinary goals of care discussion [ discussed code status 2/4, affirms DNR]  Labs   CBC:  Recent Labs  Lab 04/09/22 1645 04/09/22 1805 04/10/22 0358  WBC 8.8  --  7.4  NEUTROABS 7.4  --   --   HGB 13.8 11.2* 13.3  HCT 41.8 33.0* 39.4  MCV 93.9  --  91.8  PLT 162  --  154     Basic Metabolic Panel: Recent Labs  Lab 04/09/22 1645 04/09/22 1805 04/10/22 0358  NA 142 145 140  K 3.5 2.8* 4.8  CL 112*  --  110  CO2 18*  --  19*  GLUCOSE 133*  --  206*  BUN 12  --  15  CREATININE 1.07  --   0.97  CALCIUM 8.8*  --  9.6  MG 1.8  --   --     GFR: Estimated Creatinine Clearance: 69 mL/min (by C-G formula based on SCr of 0.97 mg/dL). Recent Labs  Lab 04/09/22 1645 04/09/22 2200 04/10/22 0032 04/10/22 0358  WBC 8.8  --   --  7.4  LATICACIDVEN  --  5.4* 3.5*  --      Liver Function Tests: Recent Labs  Lab 04/09/22 1645  AST 31  ALT 23  ALKPHOS 83  BILITOT 1.7*  PROT 6.4*  ALBUMIN 3.4*    No results for input(s): "LIPASE", "AMYLASE" in the last 168 hours. No results for input(s): "AMMONIA" in the last 168 hours.  ABG    Component Value Date/Time   PHART 7.299 (L) 10/06/2013 1603   PCO2ART 49.0 (H) 10/06/2013 1603   PO2ART 299.0 (H) 10/06/2013 1603   HCO3 14.8 (L) 04/09/2022 1805   TCO2 16 (L) 04/09/2022 1805   ACIDBASEDEF 9.0 (H) 04/09/2022 1805   O2SAT 71 04/09/2022 1805     Coagulation Profile: Recent Labs  Lab 04/09/22 1645  INR 1.4*      Critical care time: 40 min      This patient is critically ill with multiple organ system failure which requires frequent high complexity decision making, assessment, support, evaluation, and titration of therapies. This was completed through the application of advanced monitoring technologies and extensive interpretation of multiple databases. During this encounter critical care time was devoted to patient care services described in this note for 45 minutes.  Julian Hy, DO 04/10/22 8:13 AM [facility]  For contact information, see Amion. If no response to pager, please call PCCM consult pager. After hours, 7PM- 7AM, please call Elink.

## 2022-04-10 NOTE — Progress Notes (Signed)
  Echocardiogram 2D Echocardiogram has been performed.  Bruce [name] 04/10/2022, 10:46 AM

## 2022-04-10 NOTE — Progress Notes (Signed)
ANTICOAGULATION CONSULT NOTE - Consult  Pharmacy Consult for Heparin Indication: pulmonary embolus  Allergies  Allergen Reactions   Adhesive [Tape] Other (See Comments)    After right leg fracture surgery, pt developed a large blister where tape was applied to his right leg. OK to use paper tape.   Imdur [Isosorbide Dinitrate] Other (See Comments)    hallucinations   Singulair [Montelukast Sodium] Other (See Comments)    Hallucinations     Patient Measurements: Height: '6\' 1"'$  (185.4 cm) Weight: 73 kg (160 lb 15 oz) IBW/kg (Calculated) : 79.9 Heparin Dosing Weight: 84.4 kg  Vital Signs: Temp: 97.7 F (36.5 C) (02/05 1900) Temp Source: Oral (02/05 1900) BP: 140/94 (02/05 1830) Pulse Rate: 94 (02/05 1900)  Labs: Recent Labs    04/09/22 1645 04/09/22 1805 04/09/22 1838 04/09/22 1935 04/09/22 1935 04/10/22 0358 04/10/22 0901 04/10/22 1344 04/10/22 1945  HGB 13.8 11.2*  --   --   --  13.3  --   --   --   HCT 41.8 33.0*  --   --   --  39.4  --   --   --   PLT 162  --   --   --   --  154  --   --   --   APTT  --   --   --  25  --  139* 100*  --   --   LABPROT 17.0*  --   --   --   --   --   --   --   --   INR 1.4*  --   --   --   --   --   --   --   --   HEPARINUNFRC  --   --   --  <0.10*   < > 1.01* 0.92* 1.03* 0.62  CREATININE 1.07  --   --   --   --  0.97 0.98  --   --   TROPONINIHS 231*  --  232*  --   --   --  187*  --   --    < > = values in this interval not displayed.     Estimated Creatinine Clearance: 68.3 mL/min (by C-G formula based on SCr of 0.98 mg/dL).   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   UTI (urinary tract infection)    frequent UTI    Medications:  Medications Prior to Admission  Medication Sig Dispense Refill Last Dose   albuterol (PROVENTIL HFA;VENTOLIN HFA) 108 (90 Base) MCG/ACT inhaler Inhale 2 puffs into the lungs every  6 (six) hours as needed for wheezing or shortness of breath. 1 Inhaler 2 04/09/2022 at 1300   ALPRAZolam (XANAX) 0.25 MG tablet Take 0.25 mg by mouth daily as needed for anxiety.   UNKNOWN   amiodarone (PACERONE) 200 MG tablet Take 200-400 mg by mouth See admin instructions. Take 2 tablets by mouth twice a daily for 1 week, then 3 tablets for 1 week, then 1 tablet twice daily for 1 week until next appointment   04/07/2022   carvedilol (COREG) 12.5 MG tablet Take 1 tablet (12.5 mg total) by mouth 2 (two) times daily. 60 tablet 0 04/07/2022 at 0845   cetirizine (ZYRTEC) 10 MG tablet Take 10 mg by mouth daily.   04/07/2022   digoxin (LANOXIN) 0.125 MG tablet Take 0.125 mg by mouth daily.   04/07/2022 at 0845   ENTRESTO 49-51 MG Take 1 tablet by mouth 2 (two) times daily.   04/07/2022 at [place] 10 MG TABS tablet Take 10 mg by mouth daily.   04/07/2022 at am   fluticasone (FLONASE) 50 MCG/ACT nasal spray Place 1 spray into both nostrils daily.   04/09/2022 at am   furosemide (LASIX) 20 MG tablet Take 20 mg by mouth daily.   04/07/2022 at am   Multiple Vitamin (MULTIVITAMIN) tablet Take 1 tablet by mouth daily. For Men   04/07/2022 at am   nitroGLYCERIN (NITROSTAT) 0.4 MG SL tablet Place 0.4 mg under the tongue every 5 (five) minutes as needed for chest pain.    UNKNOWN   Omega-3 Fatty Acids (FISH OIL PO) Take 1,000 mg  by mouth every evening.    04/06/2022 at pm   omeprazole (PRILOSEC) 40 MG capsule Take 40 mg by mouth daily.   04/07/2022 at am   ranolazine (RANEXA) 500 MG 12 hr tablet Take 500 mg by mouth 2 (two) times daily.   04/07/2022 at am   rivaroxaban (XARELTO) 20 MG TABS tablet Take 1 tablet (20 mg total) by mouth daily. 30 tablet 0 04/06/2022 at 2030   rOPINIRole (REQUIP) 1 MG tablet Take 1 mg by mouth at bedtime.   04/06/2022 at pm   rosuvastatin (CRESTOR) 10 MG tablet Take 10 mg by mouth daily.   04/06/2022 at pm   spironolactone (ALDACTONE) 25 MG tablet Take 25 mg by mouth daily.   04/07/2022 at am   tamsulosin (FLOMAX) 0.4 MG CAPS capsule TAKE 1 CAPSULE BY MOUTH ONCE DAILY AFTERSUPPER (Patient taking differently: Take 0.4 mg by mouth daily after supper.) 30 capsule 11 04/06/2022 at pm    Scheduled:   amiodarone  200 mg Oral Daily   Chlorhexidine Gluconate Cloth  6 each Topical Daily   digoxin  0.125 mg Oral Daily   mouth rinse  15 mL Mouth Rinse 4 times per day   pantoprazole  40 mg Oral Daily   rOPINIRole  1 mg Oral QHS   rosuvastatin  10 mg Oral Daily   sacubitril-valsartan  1 tablet Oral BID   spironolactone  25 mg Oral Daily   tamsulosin  0.4 mg Oral QPC supper   umeclidinium-vilanterol  1 puff Inhalation Daily   Infusions:   heparin 1,350 Units/hr (04/10/22 1056)   PRN:   Assessment: 51 yom presenting with near syncope and SOB. Heparin per pharmacy consult placed for pulmonary embolus.  Patient prescribed xarelto pta with Hx PE Last dose stated 2/1 Will monitor aPTT and heparin level secondary to DOAC use -for now  Heparin level this am 1.01 and  aptt 139 sec (redraw to confirm drawn correctly heparin level 0.9 and aptt 100 sec)  - slightly > goal  Heparin drip rate decreased 1350 uts/hr  Patient went to IR for thrombectomy today  Received heparin 2000 uts IV x1 in procedure  Post procedure heparin level 1.03 - drawn right after procedure with bolus Will not make changes and follow up  heparin level later tonight   Update: Heparin level 0.62 (therapeutic)   Goal of Therapy:  Heparin level 0.3-0.7 units/ml aPTT 66-102 seconds Monitor platelets by anticoagulation protocol: Yes   Plan: Continue heparin infusion  1350 units/hr Heparin level in AM Continue to monitor H&H and platelets     Haiden Rawlinson A. Levada Dy, PharmD, BCPS, FNKF Clinical Pharmacist [place] Please utilize Amion for appropriate phone number to reach the unit pharmacist ([place])  04/10/2022 9:24 PM

## 2022-04-10 NOTE — Progress Notes (Signed)
ANTICOAGULATION CONSULT NOTE - Consult  Pharmacy Consult for Heparin Indication: pulmonary embolus  Allergies  Allergen Reactions   Adhesive [Tape] Other (See Comments)    After right leg fracture surgery, pt developed a large blister where tape was applied to his right leg. OK to use paper tape.   Imdur [Isosorbide Dinitrate] Other (See Comments)    hallucinations   Singulair [Montelukast Sodium] Other (See Comments)    Hallucinations     Patient Measurements: Height: '6\' 1"'$  (185.4 cm) Weight: 73 kg (160 lb 15 oz) IBW/kg (Calculated) : 79.9 Heparin Dosing Weight: 84.4 kg  Vital Signs: Temp Source: Axillary (02/05 0730) BP: 138/97 (02/05 1330) Pulse Rate: 90 (02/05 1430)  Labs: Recent Labs    04/09/22 1645 04/09/22 1805 04/09/22 1838 04/09/22 1935 04/09/22 1935 04/10/22 0358 04/10/22 0901 04/10/22 1344  HGB 13.8 11.2*  --   --   --  13.3  --   --   HCT 41.8 33.0*  --   --   --  39.4  --   --   PLT 162  --   --   --   --  154  --   --   APTT  --   --   --  25  --  139* 100*  --   LABPROT 17.0*  --   --   --   --   --   --   --   INR 1.4*  --   --   --   --   --   --   --   HEPARINUNFRC  --   --   --  <0.10*   < > 1.01* 0.92* 1.03*  CREATININE 1.07  --   --   --   --  0.97 0.98  --   TROPONINIHS 231*  --  232*  --   --   --  187*  --    < > = values in this interval not displayed.     Estimated Creatinine Clearance: 68.3 mL/min (by C-G formula based on SCr of 0.98 mg/dL).   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   UTI (urinary tract infection)    frequent UTI    Medications:  Medications Prior to Admission  Medication Sig Dispense Refill Last Dose   albuterol (PROVENTIL HFA;VENTOLIN HFA) 108 (90 Base) MCG/ACT inhaler Inhale 2 puffs into the lungs every 6 (six) hours as needed for wheezing or shortness of breath. 1 Inhaler 2 04/09/2022 at 1300   ALPRAZolam (XANAX) 0.25 MG tablet Take 0.25 mg by mouth daily as needed for anxiety.  UNKNOWN   amiodarone (PACERONE) 200 MG tablet Take 200-400 mg by mouth See admin instructions. Take 2 tablets by mouth twice a daily for 1 week, then 3 tablets for 1 week, then 1 tablet twice daily for 1 week until next appointment   04/07/2022   carvedilol (COREG) 12.5 MG tablet Take 1 tablet (12.5 mg total) by mouth 2 (two) times daily. 60 tablet 0 04/07/2022 at 0845   cetirizine (ZYRTEC) 10 MG tablet Take 10 mg by mouth daily.   04/07/2022   digoxin (LANOXIN) 0.125 MG tablet Take 0.125 mg by mouth daily.   04/07/2022 at 0845   ENTRESTO 49-51 MG Take 1 tablet by mouth 2 (two) times daily.   04/07/2022 at [place] 10 MG TABS tablet Take 10 mg by mouth daily.   04/07/2022 at am   fluticasone (FLONASE) 50 MCG/ACT nasal spray Place 1 spray into both nostrils daily.   04/09/2022 at am   furosemide (LASIX) 20 MG tablet Take 20 mg by mouth daily.   04/07/2022 at am   Multiple Vitamin (MULTIVITAMIN) tablet Take 1 tablet by mouth daily. For Men   04/07/2022 at am   nitroGLYCERIN (NITROSTAT) 0.4 MG SL tablet Place 0.4 mg under the tongue every 5 (five) minutes as needed for chest pain.    UNKNOWN   Omega-3 Fatty Acids (FISH OIL PO) Take 1,000 mg by mouth every evening.    04/06/2022 at pm   omeprazole (PRILOSEC) 40 MG capsule Take 40 mg by mouth  daily.   04/07/2022 at am   ranolazine (RANEXA) 500 MG 12 hr tablet Take 500 mg by mouth 2 (two) times daily.   04/07/2022 at am   rivaroxaban (XARELTO) 20 MG TABS tablet Take 1 tablet (20 mg total) by mouth daily. 30 tablet 0 04/06/2022 at 2030   rOPINIRole (REQUIP) 1 MG tablet Take 1 mg by mouth at bedtime.   04/06/2022 at pm   rosuvastatin (CRESTOR) 10 MG tablet Take 10 mg by mouth daily.   04/06/2022 at pm   spironolactone (ALDACTONE) 25 MG tablet Take 25 mg by mouth daily.   04/07/2022 at am   tamsulosin (FLOMAX) 0.4 MG CAPS capsule TAKE 1 CAPSULE BY MOUTH ONCE DAILY AFTERSUPPER (Patient taking differently: Take 0.4 mg by mouth daily after supper.) 30 capsule 11 04/06/2022 at pm    Scheduled:   amiodarone  200 mg Oral Daily   Chlorhexidine Gluconate Cloth  6 each Topical Daily   digoxin  0.125 mg Oral Daily   mouth rinse  15 mL Mouth Rinse 4 times per day   pantoprazole  40 mg Oral Daily   rOPINIRole  1 mg Oral QHS   rosuvastatin  10 mg Oral Daily   sacubitril-valsartan  1 tablet Oral BID   spironolactone  25 mg Oral Daily   tamsulosin  0.4 mg Oral QPC supper   umeclidinium-vilanterol  1 puff Inhalation Daily   Infusions:   heparin 1,350 Units/hr (04/10/22 1056)   PRN:   Assessment: 86 yom presenting with near syncope and SOB. Heparin per pharmacy consult placed for pulmonary embolus.  Patient prescribed xarelto pta with Hx PE Last dose stated 2/1 Will monitor aPTT and heparin level secondary to DOAC use -for now  Heparin level this am 1.01 and aptt 139 sec (redraw to confirm drawn correctly heparin level 0.9 and aptt 100 sec)  - slightly > goal  Heparin drip rate decreased 1350 uts/hr  Patient went to IR for thrombectomy today  Received heparin 2000 uts IV x1 in procedure  Post procedure heparin level 1.03 - drawn right after procedure with bolus Will not make changes and follow up heparin level later tonight    Goal of Therapy:  Heparin level 0.3-0.7 units/ml aPTT 66-102  seconds Monitor platelets by anticoagulation protocol: Yes   Plan: Continue heparin infusion  1350 units/hr Heparin level tonight and then daily  Continue to monitor H&H and platelets     Bonnita Nasuti Pharm.D. CPP, BCPS Clinical Pharmacist [phone] 04/10/2022 4:10 PM

## 2022-04-10 NOTE — Procedures (Signed)
Interventional Radiology Procedure Note  Date of Procedure: 04/10/2022  Procedure: PA angiogram and thrombectomy   Findings:  1. Successful right PA mechanical thrombectomy with [place] 24 Fr catheter with removal of moderate amount of subacute and chronic clot  2. Pre PA pressure: 72/x, mean 44 3. Post PA pressure: 69/x, mean 42  4. Residual chronic clot in the right RLL segmental PAs resistent to catheter-directed therapy  5. Right groin pursestring suture for hemostasis    Complications: No immediate complications noted.   Estimated Blood Loss: minimal  Follow-up and Recommendations: 1. Strict bedrest until tomorrow groin check by IR team and suture removal    Albin Felling, MD  Vascular & Interventional Radiology  04/10/2022 1:27 PM

## 2022-04-10 NOTE — Progress Notes (Signed)
ANTICOAGULATION CONSULT NOTE - Initial Consult  Pharmacy Consult for Heparin Indication: pulmonary embolus  Allergies  Allergen Reactions   Adhesive [Tape] Other (See Comments)    After right leg fracture surgery, pt developed a large blister where tape was applied to his right leg. OK to use paper tape.   Imdur [Isosorbide Dinitrate] Other (See Comments)    hallucinations   Singulair [Montelukast Sodium] Other (See Comments)    Hallucinations     Patient Measurements: Height: '6\' 1"'$  (185.4 cm) Weight: 73 kg (160 lb 15 oz) IBW/kg (Calculated) : 79.9 Heparin Dosing Weight: 84.4 kg  Vital Signs: Temp: 97.9 F (36.6 C) (02/05 0309) Temp Source: Axillary (02/05 0309) BP: 149/98 (02/05 0503) Pulse Rate: 96 (02/05 0503)  Labs: Recent Labs    04/09/22 1645 04/09/22 1805 04/09/22 1838 04/09/22 1935 04/10/22 0358  HGB 13.8 11.2*  --   --  13.3  HCT 41.8 33.0*  --   --  39.4  PLT 162  --   --   --  154  APTT  --   --   --  25 139*  LABPROT 17.0*  --   --   --   --   INR 1.4*  --   --   --   --   HEPARINUNFRC  --   --   --  <0.10* 1.01*  CREATININE 1.07  --   --   --  0.97  TROPONINIHS 231*  --  232*  --   --      Estimated Creatinine Clearance: 69 mL/min (by C-G formula based on SCr of 0.97 mg/dL).   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   UTI (urinary tract infection)    frequent UTI    Medications:  Medications Prior to Admission  Medication Sig Dispense Refill Last Dose   albuterol (PROVENTIL HFA;VENTOLIN HFA) 108 (90 Base) MCG/ACT inhaler Inhale 2 puffs into the lungs every 6 (six) hours as needed for wheezing or shortness of breath. 1 Inhaler 2 04/09/2022 at 1300   ALPRAZolam (XANAX) 0.25 MG tablet Take 0.25 mg by mouth daily as needed for anxiety.   UNKNOWN   amiodarone (PACERONE) 200 MG tablet Take 200-400 mg by mouth See admin instructions. Take 2 tablets by mouth twice a daily for 1 week, then 3 tablets for 1 week, then 1 tablet twice daily for 1 week until next appointment   04/07/2022   carvedilol (COREG) 12.5 MG tablet Take 1 tablet (12.5 mg total) by mouth 2 (two) times daily. 60 tablet 0 04/07/2022 at 0845   cetirizine (ZYRTEC) 10 MG tablet  Take 10 mg by mouth daily.   04/07/2022   digoxin (LANOXIN) 0.125 MG tablet Take 0.125 mg by mouth daily.   04/07/2022 at 0845   ENTRESTO 49-51 MG Take 1 tablet by mouth 2 (two) times daily.   04/07/2022 at [place] 10 MG TABS tablet Take 10 mg by mouth daily.   04/07/2022 at am   fluticasone (FLONASE) 50 MCG/ACT nasal spray Place 1 spray into both nostrils daily.   04/09/2022 at am   furosemide (LASIX) 20 MG tablet Take 20 mg by mouth daily.   04/07/2022 at am   Multiple Vitamin (MULTIVITAMIN) tablet Take 1 tablet by mouth daily. For Men   04/07/2022 at am   nitroGLYCERIN (NITROSTAT) 0.4 MG SL tablet Place 0.4 mg under the tongue every 5 (five) minutes as needed for chest pain.    UNKNOWN   Omega-3 Fatty Acids (FISH OIL PO) Take 1,000 mg by mouth every evening.    04/06/2022 at pm   omeprazole (PRILOSEC) 40 MG capsule Take 40 mg by mouth daily.   04/07/2022 at am   ranolazine (RANEXA) 500 MG 12 hr tablet Take 500 mg by mouth 2 (two) times daily.   04/07/2022 at am   rivaroxaban (XARELTO) 20 MG TABS tablet Take 1 tablet  (20 mg total) by mouth daily. 30 tablet 0 04/06/2022 at 2030   rOPINIRole (REQUIP) 1 MG tablet Take 1 mg by mouth at bedtime.   04/06/2022 at pm   rosuvastatin (CRESTOR) 10 MG tablet Take 10 mg by mouth daily.   04/06/2022 at pm   spironolactone (ALDACTONE) 25 MG tablet Take 25 mg by mouth daily.   04/07/2022 at am   tamsulosin (FLOMAX) 0.4 MG CAPS capsule TAKE 1 CAPSULE BY MOUTH ONCE DAILY AFTERSUPPER (Patient taking differently: Take 0.4 mg by mouth daily after supper.) 30 capsule 11 04/06/2022 at pm    Scheduled:   aspirin  325 mg Oral Daily   Chlorhexidine Gluconate Cloth  6 each Topical Daily   mouth rinse  15 mL Mouth Rinse 4 times per day   rosuvastatin  10 mg Oral Daily   Infusions:   heparin     PRN:   Assessment: 34 yom presenting with near syncope and SOB. Heparin per pharmacy consult placed for pulmonary embolus.  Patient is on xarelto prior to arrival. Last dose 2/3AM. Will require aPTT monitoring due to likely falsely high anti-Xa level secondary to DOAC use.  Hgb 11.2; plt 162 PT/INR 17/1.4  Heparin level came back at 1.01, PTT 139. No bleeding per Rn. We will hold for 30 minutes and resume at a lower rate. Levels are correlating now.   Goal of Therapy:  Heparin level 0.3-0.7 units/ml aPTT 66-102 seconds Monitor platelets by anticoagulation protocol: Yes   Plan: Hold heparin x 30 minutes  Reduce heparin infusion to 1350 units/hr Check anti-Xa level in 6 hours and daily while on heparin Continue to monitor H&H and platelets  Onnie Boer, PharmD, BCIDP, AAHIVP, CPP Infectious Disease Pharmacist 04/10/2022 6:43 AM

## 2022-04-10 NOTE — Consult Note (Addendum)
Chief Complaint: PE with right strain. Request is for PE thrombectomy/lysis.   Referring Physician(s): Dr. Renaldo Harrison  Supervising Physician: Juliet Rude  Patient Status: [facility] - [facility]-pt  History of Present Illness: Bruce [name] is a 77 y.o. male inpatient. History of a fib (on xarelto), CHF, CAD, MU, COPD, HTN. Presented to the ED at [facility] on 2.4.23 with chest pain, SHOB, dizziness and a near syncopal event. Found to have a saddle PE with right heart strain. Team is requesting a PE lysis.   Patient alert and laying in bed,calm. Endorses right sided chest pain that he states has improved since admission. Denies any fevers, headache, SOB, cough, abdominal pain, nausea, vomiting or bleeding.    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   UTI (urinary tract infection)    frequent UTI    Past Surgical History:  Procedure Laterality Date   CARDIAC CATHETERIZATION     CHOLECYSTECTOMY     EXTERNAL FIXATION LEG Right 10/06/2013   Procedure: CLOSED REDUCTION RIGHT TIBIAL PLATEAU FRACTURE, EXTERNAL FIXATION RIGHT LEG, PLACEMENT OF WOUND VAC;  Surgeon: Rozanna Box, MD;  Location: [place];  Service: Orthopedics;  Laterality: Right;   FEMORAL-POPLITEAL BYPASS GRAFT Right 10/06/2013   Procedure: RIGHT  POPLITEAL-POPLITEAL ARTERY BYPASS GRAFT;  Surgeon: Rosetta Posner, MD;  Location: [place];  Service: Vascular;  Laterality: Right;   FRACTURE SURGERY Right 2014   HARDWARE REMOVAL Right 12/02/2013   Procedure: REMOVAL EXTERNAL FIXATION RIGHT LEG ;  Surgeon: Rozanna Box, MD;  Location: [place];  Service: Orthopedics;  Laterality: Right;   HERNIA REPAIR Right 1990's   I & D EXTREMITY Right 10/09/2013   Procedure: IRRIGATION AND DEBRIDEMENT RIGHT LEG, CLOSURE  OF WOUNDS, PLACEMENT OF WOUND VAC ON EACH SIDE OF LEG;  Surgeon: Rozanna Box, MD;  Location: [place];  Service: Orthopedics;  Laterality: Right;   IR NEPHROSTOMY PLACEMENT LEFT  06/19/2016   IVC filter  2009   placed @ [facility]/ Removed in 2010.   IVC Filter Removed     LEFT HEART CATH AND CORONARY ANGIOGRAPHY Right 10/11/2018   Procedure: LEFT HEART CATH AND CORONARY ANGIOGRAPHY;  Surgeon: [name], MD;  Location: [place] [facility];  Service: Cardiovascular;  Laterality: Right;   ligament leg Left    NEPHROLITHOTOMY Left 06/19/2016   Procedure: NEPHROLITHOTOMY PERCUTANEOUS;  Surgeon: Hollice Espy, MD;  Location: [facility];  Service: Urology;  Laterality: Left;   ORIF TIBIA FRACTURE Right 12/29/2014   Procedure: OPEN REDUCTION INTERNAL FIXATION (ORIF) RIGHT TIBIA FRACTURE, RIA VS ICBG;  Surgeon: Altamese Grand [place], MD;  Location: [place];  Service: Orthopedics;  Laterality: Right;   RADIOACTIVE SEED IMPLANT N/A 09/12/2016   Procedure: RADIOACTIVE SEED IMPLANT/BRACHYTHERAPY IMPLANT;  Surgeon: Hollice Espy, MD;  Location: [facility];  Service: Urology;  Laterality: N/A;    Allergies: Adhesive [tape], Imdur [isosorbide dinitrate], and Singulair [montelukast sodium]  Medications: Prior to Admission medications   Medication Sig Start Date End Date Taking? Authorizing Provider  albuterol (PROVENTIL HFA;VENTOLIN HFA) 108 (90 Base) MCG/ACT inhaler Inhale 2 puffs into the lungs every 6 (six) hours as needed for wheezing or shortness of breath. 04/30/18   Yes Pyreddy, Reatha Harps, MD  ALPRAZolam (XANAX) 0.25 MG tablet Take 0.25 mg by mouth daily as needed for anxiety. 02/20/20  Yes [provider]  amiodarone (PACERONE) 200 MG tablet Take 200-400 mg by mouth See admin instructions. Take 2 tablets by mouth twice a daily for 1 week, then 3 tablets for 1 week, then 1 tablet twice daily for 1 week until next appointment   Yes [provider]  carvedilol (COREG) 12.5 MG tablet Take 1 tablet (12.5 mg total) by mouth 2 (two) times daily. 01/02/19  Yes Hall, Carole N, DO  cetirizine (ZYRTEC) 10 MG tablet Take 10 mg by mouth daily.   Yes [provider]  digoxin (LANOXIN) 0.125 MG tablet Take 0.125 mg by mouth daily. 01/10/22  Yes [provider]  ENTRESTO 49-51 MG Take 1 tablet by mouth 2 (two) times daily. 11/03/21  Yes [provider]  FARXIGA 10 MG TABS tablet Take 10 mg by mouth daily. 11/03/21  Yes [provider]  fluticasone (FLONASE) 50 MCG/ACT nasal spray Place 1 spray into both nostrils daily. 04/12/20  Yes [provider]  furosemide (LASIX) 20 MG tablet Take 20 mg by mouth daily. 11/03/21  Yes [provider]  Multiple Vitamin (MULTIVITAMIN) tablet Take 1 tablet by mouth daily. For Men   Yes [provider]  nitroGLYCERIN (NITROSTAT) 0.4 MG SL tablet Place 0.4 mg under the tongue every 5 (five) minutes as needed for chest pain.    Yes [provider]  Omega-3 Fatty Acids (FISH OIL PO) Take 1,000 mg by mouth every evening.    Yes [provider]  omeprazole (PRILOSEC) 40 MG capsule Take 40 mg by mouth daily.   Yes [provider]  ranolazine (RANEXA) 500 MG 12 hr tablet Take 500 mg by mouth 2 (two) times daily. 01/18/22  Yes [provider]  rivaroxaban (XARELTO) 20 MG TABS tablet Take 1 tablet (20 mg total) by mouth daily. 01/06/19  Yes Hall, Carole N, DO  rOPINIRole (REQUIP) 1 MG tablet Take 1 mg by mouth at bedtime. 11/04/18  Yes [provider]  rosuvastatin (CRESTOR) 10 MG tablet Take 10 mg by mouth daily. 01/10/22  Yes [provider]  spironolactone (ALDACTONE) 25 MG tablet Take 25 mg by mouth daily. 11/03/21  Yes [provider]  tamsulosin (FLOMAX) 0.4 MG CAPS capsule TAKE 1 CAPSULE BY MOUTH ONCE DAILY AFTERSUPPER Patient taking differently: Take 0.4 mg by mouth daily after supper. 07/13/21  Yes Noreene Filbert, MD     Family History  Problem Relation Age of Onset   Hypertension Mother    Prostate cancer Brother    Mental illness Neg Hx     Social History   Socioeconomic History   Marital status: Single    Spouse name: Not on file   Number of children: 2   Years of education: Not on file   Highest education level: 6th grade  Occupational History   Not on file  Tobacco Use   Smoking status: Some Days    Packs/day: 0.50    Years: 52.00    Total pack years: 26.00    Types: Cigarettes   Smokeless tobacco: Never  Vaping Use   Vaping Use: Never used  Substance and Sexual Activity   Alcohol use: No    Alcohol/week: 0.0 standard drinks of alcohol    Comment: Stopped 2009   Drug use: No   Sexual activity: Not Currently  Other Topics Concern   Not on file  Social History Narrative   ** Merged History Encounter **       Social Determinants of Health   Financial Resource Strain: High Risk (02/05/2017)   Overall Financial Resource Strain (CARDIA)    Difficulty of Paying Living Expenses: Very hard  Food Insecurity: Food Insecurity Present (02/05/2017)   Hunger Vital Sign    Worried About Running Out of Food in the Last Year: Often true    Ran Out of Food in the Last Year: Often true  Transportation Needs: No Transportation Needs (02/05/2017)   PRAPARE - Hydrologist (Medical): No    Lack of Transportation (Non-Medical): No  Physical Activity: Inactive (02/05/2017)   Exercise Vital Sign    Days of Exercise per Week: 0 days    Minutes of Exercise per  Session: 0 min  Stress: Stress Concern Present (02/05/2017)   [place]    Feeling of Stress : Rather much  Social Connections: Moderately Isolated (02/05/2017)   Social Connection and Isolation Panel [NHANES]    Frequency of Communication with Friends and Family: More than three times a week    Frequency of Social Gatherings with Friends and Family: Once a week    Attends Religious Services: Never    Marine scientist or Organizations: No    Attends Archivist Meetings: Never    Marital Status: Divorced    Review of Systems: A 12 point ROS discussed and pertinent positives are indicated in the HPI above.  All other systems are negative.  Review of Systems  Constitutional:  Negative for fever.  HENT:  Negative for congestion.   Respiratory:  Negative for cough and shortness of breath.   Cardiovascular:  Positive for chest pain.  Gastrointestinal:  Negative for abdominal pain.  Neurological:  Negative for headaches.  Psychiatric/Behavioral:  Negative for behavioral problems and confusion.     Vital Signs: BP (!) 137/92   Pulse 100   Temp 97.9 F (36.6 C) (Axillary)   Resp (!) 28   Ht '6\' 1"'$  (1.854 m)   Wt 160 lb 15 oz (73 kg)   SpO2 (!) 88%   BMI 21.23 kg/m    Physical Exam Vitals and nursing note reviewed.  Constitutional:      Appearance: He is well-developed.  HENT:     Head: Normocephalic.  Cardiovascular:     Rate and Rhythm: Normal rate and regular rhythm.  Pulmonary:     Effort: Pulmonary effort is normal.     Breath sounds: Normal breath sounds.     Comments: On o2 via [place] Musculoskeletal:        General: Normal range of motion.     Cervical back: Normal range of motion.  Skin:    General: Skin is dry.  Neurological:     Mental Status: He is alert and oriented to person, place, and  time.     Imaging: CT Angio Chest PE W and/or Wo Contrast  Result Date:  04/09/2022 CLINICAL DATA:  Short of breath, hypoxia EXAM: CT ANGIOGRAPHY CHEST WITH CONTRAST TECHNIQUE: Multidetector CT imaging of the chest was performed using the standard protocol during bolus administration of intravenous contrast. Multiplanar CT image reconstructions and MIPs were obtained to evaluate the vascular anatomy. RADIATION DOSE REDUCTION: This exam was performed according to the departmental dose-optimization program which includes automated exposure control, adjustment of the mA and/or kV according to patient size and/or use of iterative reconstruction technique. CONTRAST:  2m OMNIPAQUE IOHEXOL 350 MG/ML SOLN COMPARISON:  04/09/2022, 05/02/2020 FINDINGS: Cardiovascular: This is a technically adequate evaluation of the pulmonary vasculature. There are bilateral central and segmental pulmonary emboli, with large clot burden primarily within the right main pulmonary artery. There is evidence of right heart strain, with the RV/LV ratio measuring 1.16. No pericardial effusion. Normal caliber of the thoracic aorta. Atherosclerosis of the aorta and coronary vasculature. Mediastinum/Nodes: No enlarged mediastinal, hilar, or axillary lymph nodes. Thyroid gland, trachea, and esophagus demonstrate no significant findings. Lungs/Pleura: Background emphysema and scattered areas of scarring are stable. No acute airspace disease, effusion, or pneumothorax. Central airways are patent. 6 mm ground-glass nodule is again seen within the left lobe reference image 88/6, increased since prior study where this had measured approximately 4 mm by my measurement. No new pulmonary nodules or masses. Upper Abdomen: No acute abnormality. Musculoskeletal: No acute or destructive bony lesions. Reconstructed images demonstrate no additional findings. Review of the MIP images confirms the above findings. IMPRESSION: 1. Extensive bilateral central and segmental pulmonary emboli, with CT evidence of right heart strain (RV/LV Ratio =  1.16) consistent with at least submassive (intermediate risk) PE. The presence of right heart strain has been associated with an increased risk of morbidity and mortality. Please refer to the "Code PE Focused" order set in EPIC. 2. 6 mm left upper lobe ground-glass nodule, increased since prior exam from 2 years ago where this had measured approximately 4 mm by my measurement. Adenocarcinoma cannot be excluded given increase in size, and thoracic surgery consultation could be considered. If this area is not resected, CT chest follow-up in 2 years is recommended. This recommendation follows the consensus statement: Guidelines for Management of Incidental Pulmonary Nodules Detected on CT Images: From the Fleischner Society 2017; Radiology 2017; 284:228-243. 3. Aortic Atherosclerosis (ICD10-I70.0) and Emphysema (ICD10-J43.9). Critical Value/emergent results were called by telephone at the time of interpretation on 04/09/2022 at 8:20 pm to provider [facility], who verbally acknowledged these results. Electronically Signed   By: MRanda NgoM.D.   On: 04/09/2022 20:25   DG Chest Portable 1 View  Result Date: 04/09/2022 CLINICAL DATA:  Short of breath EXAM: PORTABLE CHEST 1 VIEW COMPARISON:  03/11/2021 FINDINGS: Two frontal views of the chest demonstrate a stable cardiac silhouette. Chronic prominence of the central pulmonary vasculature. No acute airspace disease, effusion, or pneumothorax. Mild background scarring unchanged. No acute bony abnormality. IMPRESSION: 1. No acute intrathoracic process. Electronically Signed   By: MRanda NgoM.D.   On: 04/09/2022 17:21    Labs:  CBC: Recent Labs    04/09/22 1645 04/09/22 1805 04/10/22 0358  WBC 8.8  --  7.4  HGB 13.8 11.2* 13.3  HCT 41.8 33.0* 39.4  PLT 162  --  154    COAGS: Recent Labs    04/09/22 1645 04/09/22 1935 04/10/22 0358  INR 1.4*  --   --  APTT  --  25 139*    BMP: Recent Labs    04/09/22 1645 04/09/22 1805 04/10/22 0358   NA 142 145 140  K 3.5 2.8* 4.8  CL 112*  --  110  CO2 18*  --  19*  GLUCOSE 133*  --  206*  BUN 12  --  15  CALCIUM 8.8*  --  9.6  CREATININE 1.07  --  0.97  GFRNONAA >60  --  >60    LIVER FUNCTION TESTS: Recent Labs    04/09/22 1645  BILITOT 1.7*  AST 31  ALT 23  ALKPHOS 83  PROT 6.4*  ALBUMIN 3.4*     Assessment and Plan: 77 y.o. male inpatient. History of a fib (on xarelto), CHF, CAD, MU, COPD, HTN. [facility]4.23 with chest pain, SHOB, dizziness and a near syncopal event. Found to have a saddle PE with right heart strain. Team is requesting a PE  thrombectomy/lysis.   Lactic acid 3.5. CT angio reads Extensive bilateral central and segmental pulmonary emboli, with CT evidence of right heart strain (RV/LV Ratio = 1.16) consistent with at least submassive (intermediate risk) PE. The presence of right heart strain has been associated with an increased risk of morbidity and mortality. Please refer to the "Code PE Focused" order set in EPIC. Allergies include tape. Patient is on ASA 325 mg last dose given on 2.4.24 and is currently on a heparin gtt.    Thank you for this interesting consult.  I greatly enjoyed meeting DEMARQUES PILZ and look forward to participating in their care.  A copy of this report was sent to the requesting provider on this date.  Electronically Signed: Jacqualine Mau, NP 04/10/2022, 9:04 AM   I spent a total of 40 Minutes    in face to face in clinical consultation, greater than 50% of which was counseling/coordinating care for PE thrombectomy/lysis

## 2022-04-10 NOTE — Progress Notes (Signed)
BLE venous duplex has been completed.  Preliminary results given to Dr. Carlis Abbott.   Results can be found under chart review under CV PROC. 04/10/2022 10:53 AM Surie Suchocki RVT, RDMS

## 2022-04-10 NOTE — Progress Notes (Signed)
eLink Physician-Brief Progress Note Patient Name: Bruce [name] DOB: [birth year] MRN: [id number]   Date of Service  04/10/2022  HPI/Events of Note  76/M with afib, HFrEF, who presents with chest pain and dizziness. Evaluation in the ED showed PE with evidence of right heart strain. He was started on heparin drip and admitted to the ICU.   eICU Interventions  PE - Started on heparin drip - Monitor for bleed, thrombocytopenia - Plan for echocardiogram, LE doppler - Trend troponin to peak.  - IR consulted for possible EKOS - Continue supplemental O2, titrate to maintain SPO2 >90%        Katty Fretwell M DELA CRUZ 04/10/2022, 12:56 AM

## 2022-04-10 NOTE — ED Notes (Addendum)
Report given to ICU RN

## 2022-04-10 NOTE — Sedation Documentation (Signed)
Unable to monitor etCO2 due to HFNC and pt need for high flow o2 for case to maintain adequate o2. MD aware and acknowledges.

## 2022-04-11 ENCOUNTER — Telehealth: Payer: Self-pay | Admitting: Emergency Medicine

## 2022-04-11 ENCOUNTER — Other Ambulatory Visit (HOSPITAL_COMMUNITY): Payer: Self-pay

## 2022-04-11 DIAGNOSIS — I2602 Saddle embolus of pulmonary artery with acute cor pulmonale: Secondary | ICD-10-CM | POA: Diagnosis not present

## 2022-04-11 DIAGNOSIS — I50811 Acute right heart failure: Secondary | ICD-10-CM

## 2022-04-11 DIAGNOSIS — I5022 Chronic systolic (congestive) heart failure: Secondary | ICD-10-CM | POA: Diagnosis not present

## 2022-04-11 DIAGNOSIS — Z7901 Long term (current) use of anticoagulants: Secondary | ICD-10-CM | POA: Diagnosis not present

## 2022-04-11 DIAGNOSIS — R918 Other nonspecific abnormal finding of lung field: Secondary | ICD-10-CM

## 2022-04-11 DIAGNOSIS — I2609 Other pulmonary embolism with acute cor pulmonale: Secondary | ICD-10-CM | POA: Diagnosis not present

## 2022-04-11 LAB — CBC
HCT: 35.3 % — ABNORMAL LOW (ref 39.0–52.0)
Hemoglobin: 12 g/dL — ABNORMAL LOW (ref 13.0–17.0)
MCH: 31.6 pg (ref 26.0–34.0)
MCHC: 34 g/dL (ref 30.0–36.0)
MCV: 92.9 fL (ref 80.0–100.0)
Platelets: 167 10*3/uL (ref 150–400)
RBC: 3.8 MIL/uL — ABNORMAL LOW (ref 4.22–5.81)
RDW: 14.2 % (ref 11.5–15.5)
WBC: 11.1 10*3/uL — ABNORMAL HIGH (ref 4.0–10.5)
nRBC: 0 % (ref 0.0–0.2)

## 2022-04-11 LAB — APTT: aPTT: 138 seconds — ABNORMAL HIGH (ref 24–36)

## 2022-04-11 LAB — HEPARIN LEVEL (UNFRACTIONATED)
Heparin Unfractionated: 1 IU/mL — ABNORMAL HIGH (ref 0.30–0.70)
Heparin Unfractionated: 0.63 IU/mL (ref 0.30–0.70)
Heparin Unfractionated: 0.59 IU/mL (ref 0.30–0.70)

## 2022-04-11 MED ORDER — HEPARIN (PORCINE) 25000 UT/250ML-% IV SOLN
1200.0000 [IU]/h | INTRAVENOUS | Status: DC
  Administered 2022-04-11 – 2022-04-13 (×3): 1200 [IU]/h via INTRAVENOUS
  Filled 2022-04-11 (×2): qty 250

## 2022-04-11 NOTE — Consult Note (Addendum)
Advanced Heart Failure Team Consult Note   Primary Physician: Bruce Berry, NP PCP-Cardiologist:  Bruce Laming, MD  Reason for Consultation: Pulmonary Hypertension   HPI:    Bruce [name] is seen today for evaluation of Pulmonary Hypertension at the request of Bruce [name].   Bruce [name] is a 89  CAD, MI, COPD, HTN, prostate cancer, colon cancer, DVT 5 years ago, PAF, tobacco abuse,  and HFrEF.   Echo EF 40-45%.  Felt  bad for the last few weeks. Says he missed doses of xarelto because he has been unable to eat due to [name]/V. He has lost over 20 pounds in the last month. He does not have a permanent place to live and sounds like he has been couch surfing. Smokes 2-3 cigarettes per day.   Presented to the ED with chest pain and presyncope. CTA bilateral pulmonary emboli.  CODE PE called. CCM/IR ---> mechanical thrombectomy of R pulmonary artery. Echo repeated EF 20-25% RV D shaped septum. Pertinent admission labs: BNP 537, creatinine 1.7, K 3.5, dig level < 0.2, hgb 13.8, and HS Trop 001>749.   Lactic acid 5.4>>>2.4  Frustrated wants to get out of bed. Complaining of nausea.   Review of Systems: [y] = yes, '[ ]'$  = no   General: Weight gain '[ ]'$ ; Weight loss [ Y]; Anorexia '[ ]'$ ; Fatigue [ Y]; Fever '[ ]'$ ; Chills '[ ]'$ ; Weakness [Y ]  Cardiac: Chest pain/pressure '[ ]'$ ; Resting SOB '[ ]'$ ; Exertional SOB [ Y]; Orthopnea [ Y]; Pedal Edema '[ ]'$ ; Palpitations '[ ]'$ ; Syncope '[ ]'$ ; Presyncope '[ ]'$ ; Paroxysmal nocturnal dyspnea'[ ]'$   Pulmonary: Cough '[ ]'$ ; Wheezing'[ ]'$ ; Hemoptysis'[ ]'$ ; Sputum '[ ]'$ ; Snoring '[ ]'$   GI: Vomiting'[ ]'$ ; Dysphagia'[ ]'$ ; Melena'[ ]'$ ; Hematochezia '[ ]'$ ; Heartburn'[ ]'$ ; Abdominal pain '[ ]'$ ; Constipation '[ ]'$ ; Diarrhea '[ ]'$ ; BRBPR '[ ]'$   GU: Hematuria'[ ]'$ ; Dysuria '[ ]'$ ; Nocturia'[ ]'$   Vascular: Pain in legs with walking '[ ]'$ ; Pain in feet with lying flat '[ ]'$ ; Non-healing sores '[ ]'$ ; Stroke '[ ]'$ ; TIA '[ ]'$ ; Slurred speech '[ ]'$ ;  Neuro: Headaches'[ ]'$ ; Vertigo'[ ]'$ ; Seizures'[ ]'$ ; Paresthesias'[ ]'$ ;Blurred vision '[ ]'$ ; Diplopia [  ]; Vision changes '[ ]'$   Ortho/Skin: Arthritis '[ ]'$ ; Joint pain [ Y]; Muscle pain '[ ]'$ ; Joint swelling '[ ]'$ ; Back Pain [ Y]; Rash '[ ]'$   Psych: Depression'[ ]'$ ; Anxiety'[ ]'$   Heme: Bleeding problems '[ ]'$ ; Clotting disorders '[ ]'$ ; Anemia '[ ]'$   Endocrine: Diabetes '[ ]'$ ; Thyroid dysfunction'[ ]'$   Home Medications Prior to Admission medications   Medication Sig Start Date End Date Taking? Authorizing Provider  albuterol (PROVENTIL HFA;VENTOLIN HFA) 108 (90 Base) MCG/ACT inhaler Inhale 2 puffs into the lungs every 6 (six) hours as needed for wheezing or shortness of breath. 04/30/18  Yes Bruce [name], Bruce Harps, MD  ALPRAZolam (XANAX) 0.25 MG tablet Take 0.25 mg by mouth daily as needed for anxiety. 02/20/20  Yes [provider]  amiodarone (PACERONE) 200 MG tablet Take 200-400 mg by mouth See admin instructions. Take 2 tablets by mouth twice a daily for 1 week, then 3 tablets for 1 week, then 1 tablet twice daily for 1 week until next appointment   Yes [provider]  carvedilol (COREG) 12.5 MG tablet Take 1 tablet (12.5 mg total) by mouth 2 (two) times daily. 01/02/19  Yes Bruce [name], Bruce [name], Bruce [name]  cetirizine (ZYRTEC) 10 MG tablet Take 10 mg by mouth daily.   Yes [provider]  digoxin (  LANOXIN) 0.125 MG tablet Take 0.125 mg by mouth daily. 01/10/22  Yes [provider]  ENTRESTO 49-51 MG Take 1 tablet by mouth 2 (two) times daily. 11/03/21  Yes [provider]  FARXIGA 10 MG TABS tablet Take 10 mg by mouth daily. 11/03/21  Yes [provider]  fluticasone (FLONASE) 50 MCG/ACT nasal spray Place 1 spray into both nostrils daily. 04/12/20  Yes [provider]  furosemide (LASIX) 20 MG tablet Take 20 mg by mouth daily. 11/03/21  Yes [provider]  Multiple Vitamin (MULTIVITAMIN) tablet Take 1 tablet by mouth daily. For Men   Yes [provider]  nitroGLYCERIN (NITROSTAT) 0.4 MG SL tablet Place 0.4 mg under the tongue every 5 (five) minutes as needed for  chest pain.    Yes [provider]  Omega-3 Fatty Acids (FISH OIL PO) Take 1,000 mg by mouth every evening.    Yes [provider]  omeprazole (PRILOSEC) 40 MG capsule Take 40 mg by mouth daily.   Yes [provider]  ranolazine (RANEXA) 500 MG 12 hr tablet Take 500 mg by mouth 2 (two) times daily. 01/18/22  Yes [provider]  rivaroxaban (XARELTO) 20 MG TABS tablet Take 1 tablet (20 mg total) by mouth daily. 01/06/19  Yes Bruce [name], Bruce [name], Bruce [name]  rOPINIRole (REQUIP) 1 MG tablet Take 1 mg by mouth at bedtime. 11/04/18  Yes [provider]  rosuvastatin (CRESTOR) 10 MG tablet Take 10 mg by mouth daily. 01/10/22  Yes [provider]  spironolactone (ALDACTONE) 25 MG tablet Take 25 mg by mouth daily. 11/03/21  Yes [provider]  tamsulosin (FLOMAX) 0.4 MG CAPS capsule TAKE 1 CAPSULE BY MOUTH ONCE DAILY AFTERSUPPER Patient taking differently: Take 0.4 mg by mouth daily after supper. 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   UTI (urinary tract infection)    frequent UTI    Past Surgical History: Past Surgical History:  Procedure Laterality Date   CARDIAC CATHETERIZATION     CHOLECYSTECTOMY      EXTERNAL FIXATION LEG Right 10/06/2013   Procedure: CLOSED REDUCTION RIGHT TIBIAL PLATEAU FRACTURE, EXTERNAL FIXATION RIGHT LEG, PLACEMENT OF WOUND VAC;  Surgeon: Rozanna Box, MD;  Location: [place];  Service: Orthopedics;  Laterality: Right;   FEMORAL-POPLITEAL BYPASS GRAFT Right 10/06/2013   Procedure: RIGHT POPLITEAL-POPLITEAL ARTERY BYPASS GRAFT;  Surgeon: Rosetta Posner, MD;  Location: [place];  Service: Vascular;  Laterality: Right;   FRACTURE SURGERY Right 2014   HARDWARE REMOVAL Right 12/02/2013   Procedure: REMOVAL EXTERNAL FIXATION RIGHT  LEG ;  Surgeon: Rozanna Box, MD;  Location: [place];  Service: Orthopedics;  Laterality: Right;   HERNIA REPAIR Right 1990's   I & D EXTREMITY Right 10/09/2013   Procedure: IRRIGATION AND DEBRIDEMENT RIGHT LEG, CLOSURE  OF WOUNDS, PLACEMENT OF WOUND VAC ON EACH SIDE OF LEG;  Surgeon: Rozanna Box, MD;  Location: [place];  Service: Orthopedics;  Laterality: Right;   IR ANGIOGRAM PULMONARY RIGHT SELECTIVE  04/10/2022   IR ANGIOGRAM SELECTIVE EACH ADDITIONAL VESSEL  04/10/2022   IR NEPHROSTOMY PLACEMENT LEFT  06/19/2016   IR THROMBECT PRIM MECH INIT (INCLU) MOD SED  04/10/2022   IR US GUIDE VASC ACCESS RIGHT  04/10/2022   IVC filter  2009   placed @ [facility]/ Removed in 2010.   IVC Filter Removed     LEFT HEART CATH AND CORONARY ANGIOGRAPHY Right 10/11/2018   Procedure: LEFT HEART CATH AND CORONARY ANGIOGRAPHY;  Surgeon: [name], MD;  Location: [place] [facility];  Service: Cardiovascular;  Laterality: Right;   ligament leg Left    NEPHROLITHOTOMY Left 06/19/2016   Procedure: NEPHROLITHOTOMY PERCUTANEOUS;  Surgeon: Hollice Espy, MD;  Location: [facility];  Service: Urology;  Laterality: Left;   ORIF TIBIA FRACTURE Right 12/29/2014   Procedure: OPEN REDUCTION INTERNAL FIXATION (ORIF) RIGHT TIBIA FRACTURE, RIA VS ICBG;  Surgeon: Alt[place]e [place], MD;  Location: [place];  Service: Orthopedics;  Laterality: Right;   RADIOACTIVE SEED IMPLANT [name]/A 09/12/2016   Procedure:  RADIOACTIVE SEED IMPLANT/BRACHYTHERAPY IMPLANT;  Surgeon: Hollice Espy, MD;  Location: [facility];  Service: Urology;  Laterality: [name]/A;    Family History: Family History  Problem Relation Age of Onset   Hypertension Mother    Prostate cancer Brother    Mental illness Neg Hx     Social History: Social History   Socioeconomic History   Marital status: Single    Spouse name: Not on file   Number of children: 2   Years of education: Not on file   Highest education level: 6th grade  Occupational History   Not on file  Tobacco Use   Smoking status: Some Days    Packs/day: 0.50    Years: 52.00    Total pack years: 26.00    Types: Cigarettes   Smokeless tobacco: Never  Vaping Use   Vaping Use: Never used  Substance and Sexual Activity   Alcohol use: No    Alcohol/week: 0.0 standard drinks of alcohol    Comment: Stopped 2009   Drug use: No   Sexual activity: Not Currently  Other Topics Concern   Not on file  Social History Narrative   ** Merged History Encounter **       Social Determinants of Health   Financial Resource Strain: High Risk (02/05/2017)   Overall Financial Resource Strain (CARDIA)    Difficulty of Paying Living Expenses: Very hard  Food Insecurity: Food Insecurity Present (02/05/2017)   Hunger Vital Sign    Worried About Running Out of Food in the Last Year: Often true    Ran Out of Food in the Last Year: Often true  Transportation Needs: No Transportation Needs (02/05/2017)   PRAPARE - Hydrologist (Medical): No    Lack of Transportation (Non-Medical): No  Physical Activity: Inactive (02/05/2017)   Exercise Vital Sign    Days of Exercise per Week: 0 days    Minutes of Exercise per Session: 0 min  Stress: Stress Concern Present (02/05/2017)   [place]  [employer] - Occupational Stress Questionnaire    Feeling of Stress : Rather much  Social Connections: Moderately Isolated (02/05/2017)   Social Connection  and Isolation Panel [NHANES]    Frequency of Communication with Friends and Family: More than three times a week    Frequency of Social Gatherings with Friends and Family: Once a week    Attends Religious Services: Never    Marine scientist or Organizations: No    Attends Archivist Meetings: Never    Marital Status: Divorced    Allergies:  Allergies  Allergen Reactions   Adhesive [Tape] Other (See Comments)    After right leg fracture surgery, pt developed a large blister where tape was applied to his right leg. OK to use paper tape.   Imdur [Isosorbide Dinitrate] Other (See Comments)    hallucinations   Singulair [Montelukast Sodium] Other (See Comments)    Hallucinations     Objective:    Vital Signs:   Temp:  [97.7 F (36.5 C)-98.6 F (37 C)] 97.8 F (36.6 C) (02/06 1122) Pulse Rate:  [67-97] 88 (02/06 1300) Resp:  [14-34] 21 (02/06 1300) BP: (96-140)/(64-95) 101/68 (02/06 1300) SpO2:  [85 %-99 %] 94 % (02/06 1300) Weight:  [74 kg] 74 kg (02/06 0500) Last BM Date : 04/10/22  Weight change: Filed Weights   04/09/22 1636 04/10/22 0408 04/11/22 0500  Weight: 84.4 kg 73 kg 74 kg    Intake/Output:   Intake/Output Summary (Last 24 hours) at 04/11/2022 1414 Last data filed at 04/11/2022 1300 Gross per 24 hour  Intake 992.44 ml  Output 800 ml  Net 192.44 ml      Physical Exam    General:   No resp difficulty HEENT: normal Neck: supple. JVP 5-6 . Carotids 2+ bilat; no bruits. No lymphadenopathy or thyromegaly appreciated. Cor: PMI nondisplaced. Regular rate & rhythm. No rubs, gallops or murmurs. Lungs: clear on 2 liters  Abdomen: soft, nontender, nondistended. No hepatosplenomegaly. No bruits or masses. Good bowel sounds. Extremities: no cyanosis, clubbing, rash, edema Neuro: alert & orientedx3, cranial nerves grossly intact. moves all 4 extremities w/o difficulty. Affect pleasant   Telemetry   SR 80s   EKG    SR 117 on admit   Labs    Basic Metabolic Panel: Recent Labs  Lab 04/09/22 1645 04/09/22 1805 04/10/22 0358 04/10/22 0901  NA 142 145 140 135  K 3.5 2.8* 4.8 4.4  CL 112*  --  110 108  CO2 18*  --  19* 16*  GLUCOSE 133*  --  206* 164*  BUN 12  --  15 17  CREATININE 1.07  --  0.97 0.98  CALCIUM 8.8*  --  9.6 9.4  MG 1.8  --   --   --     Liver Function Tests: Recent Labs  Lab 04/09/22 1645  AST 31  ALT 23  ALKPHOS 83  BILITOT 1.7*  PROT 6.4*  ALBUMIN 3.4*   No results for input(s): "LIPASE", "AMYLASE" in the last 168 hours. No results for input(s): "AMMONIA" in the last 168 hours.  CBC: Recent Labs  Lab 04/09/22 1645 04/09/22 1805 04/10/22 0358 04/11/22 0622  WBC 8.8  --  7.4 11.1*  NEUTROABS 7.4  --   --   --   HGB 13.8 11.2* 13.3 12.0*  HCT 41.8 33.0* 39.4 35.3*  MCV 93.9  --  91.8 92.9  PLT 162  --  154 167    Cardiac Enzymes:  No results for input(s): "CKTOTAL", "CKMB", "CKMBINDEX", "TROPONINI" in the last 168 hours.  BNP: BNP (last 3 results) Recent Labs    04/09/22 1645 04/10/22 0901  BNP 537.6* 1,012.6*    ProBNP (last 3 results) No results for input(s): "PROBNP" in the last 8760 hours.   CBG: No results for input(s): "GLUCAP" in the last 168 hours.  Coagulation Studies: Recent Labs    04/09/22 1645  LABPROT 17.0*  INR 1.4*     Imaging   No results found.   Medications:     Current Medications:  amiodarone  200 mg Oral Daily   Chlorhexidine Gluconate Cloth  6 each Topical Daily   digoxin  0.125 mg Oral Daily   mouth rinse  15 mL Mouth Rinse 4 times per day   pantoprazole  40 mg Oral Daily   rOPINIRole  1 mg Oral QHS   rosuvastatin  10 mg Oral Daily   sacubitril-valsartan  1 tablet Oral BID   spironolactone  25 mg Oral Daily   tamsulosin  0.4 mg Oral QPC supper   umeclidinium-vilanterol  1 puff Inhalation Daily    Infusions:  heparin 1,200 Units/hr (04/11/22 1300)      Patient Profile   Bruce Hair is a 10  CAD, MI, COPD prior  tobacco abuse, HTN, prostate cancer , PAF, and HFrEF.   Echo EF 40-45%.  Admitted with acute PE.   Assessment/Plan   1. Acute PE, submassive--->  -Missed several doses of xarelto due to nausea.  Lactic acid clearing 5.4>>>2.4 -S/P Mechanical Thrombectomy R Pulmonary Artery.  - On heparin drip.. Eventual switch to elqiuis.   - Will need eventual  VQ.   2. Acute Hypoxic Respiratory Failure, COPD -On 2 liters. Stable sats.   3. PAF  -Maintain ST  - On amiodarone.  I wonder if nausea is related to amiodarone. I am going to stop amiodarone. Looking back at his EKGs  he has been in SR for several years.   - Check sed rate.  -On heparin drip. Anticipate switching to eliquis.   4. Chronic HFrEF  -Echo EF down 20-25% with D shaped septum RV Failure. from previous 40-45%.  -Volume status stable. Does not need diuretics.  - Consider bisoprolol in next day or so.  - Continue entresto, spiro, and digoxin.  -Hold off on farxiga.   5. Lung nodule  6. Smoker  Discussed smoking cessation.   7. Long Neck  DNR   8. Social  No permanent residence. Couch surfs. Consult TOC.   Needs to be followed in the community. Will need Paramedicine. I will refer.   Length of Stay: 2  Darrick Grinder, NP  04/11/2022, 2:14 PM  Advanced Heart Failure Team Pager [phone] (M-F; 7a - 5p)  Please contact [facility] for night-coverage after hours (4p -7a ) and weekends on [email or web address]  Patient seen and examined with the above-signed Advanced Practice Provider and/or Housestaff. I personally reviewed laboratory data, imaging studies and relevant notes. I independently examined the patient and formulated the important aspects of the plan. I have edited the note to reflect any of my changes or salient points. I have personally discussed the plan with the patient and/or family.  77 y/o homeless male with COPD ad ongoing tobacco use, CAD, systolic HF EF 32-99%, colon CA previous lung CA  Admitted with submassive PE  requiring thrombectomy but clot note to be acute on chronic. Also with a/c LE DVT.  EF also found to be down to 20-25%.  Recent 30 pound weight loss   General:  Chronically ill appearing. No resp difficulty HEENT: normal Neck: supple. JVP hard to see. Carotids 2+ bilat; no bruits. No lymphadenopathy or thryomegaly appreciated. Cor: PMI nondisplaced. Regular rate & rhythm. No rubs, gallops or murmurs. Lungs: markedly decreased throughout  Abdomen: soft, nontender, nondistended. No hepatosplenomegaly. No bruits or masses. Good bowel sounds. Extremities: no cyanosis, clubbing, rash, edema Neuro: alert & orientedx3, cranial nerves grossly intact. moves all 4 extremities w/o difficulty. 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 Concern for underlying CTEPH. LVEF also down further.  Situation complicated by homeless and concern for possible underlying malignancy. Agree with [facility] for DVT/PE. Will follow as outpatient and can consider w/u for chronic PE. Given worsening EF may be worthwhile to consider coronary angio before he is committed to oral AC. Will d/w CCM.   Glori Bickers, MD  8:55 PM

## 2022-04-11 NOTE — Progress Notes (Signed)
Heart Failure Navigator Progress Note  Assessed for Heart & Vascular TOC clinic readiness.  Patient does not meet criteria due to Advanced Heart Failure Team consult. .   Navigator will sign off at this time.    Glynda Soliday, BSN, RN Heart Failure Nurse Navigator Secure Chat Only   

## 2022-04-11 NOTE — Progress Notes (Addendum)
NAME:  Bruce [name], MRN:  [id number], DOB:  02-Mar-1946, LOS: 2 ADMISSION DATE:  04/09/2022, CONSULTATION DATE:  04/09/22 REFERRING MD:  Armandina Gemma - EM, CHIEF COMPLAINT:  chest pain and dizziness    History of Present Illness:  77yo M PMH Possible Afib (on amio and reportedly on xarelto though pt is not exactly sure of the dx prompting these Rx) HFrEF, CAD, MI, COPD prior tobacco use, HTN presented to ED 2/4 with CC chest pain and dizziness. Reports he almost passed out the day before, has been SOB and had worse chest pain prompting ED presentation. Workup in ED revealed PE with R heart strain.  HDS but hypoxic, started on hep gtt   PCCM asked to admit in this setting   Pertinent  Medical History  Prostate cancer Tobacco use COPD ?Afib Anxiety HTN MI HFrEF   Significant Hospital Events: Including procedures, antibiotic start and stop dates in addition to other pertinent events   2/4 admit to ICU for submassive PE . CT also w incr size of previously seen pulm nodule (27m in 2020 now 642m  2/5 IR thrombectomy on the R; PA pressures remain elevated after  Interim History / Subjective:  Feeling improved. O2 weaned down, HR improved overnight.   Objective   Blood pressure 108/73, pulse 79, temperature 97.9 F (36.6 C), temperature source Oral, resp. rate (!) 24, height '6\' 1"'$  (1.854 m), weight 74 kg, SpO2 94 %.        Intake/Output Summary (Last 24 hours) at 04/11/2022 0939 Last data filed at 04/11/2022 0750 Gross per 24 hour  Intake 702.09 ml  Output 1001 ml  Net -298.91 ml    Filed Weights   04/09/22 1636 04/10/22 0408 04/11/22 0500  Weight: 84.4 kg 73 kg 74 kg    Examination: General: chronically ill appearing man lying in bed in NAD HENT: [place]/AT, eyes anicteric Lungs: breathing comfortably on 3L [place], tachypnea resolved Cardiovascular: S1S2, RRR Abdomen: soft, NT Extremities: no c/c/e. Stasis dermatitis shins Neuro: awake, alert, moving all extremities, answering questions  appropriately Derm: warm, dry, no rashes  WBC 11.1 H/H 12/35.3 Platelet 167  Bicarb 19 BUN 15 Cr 0.97  Echo: LVEF 20-25%, global hypokinesis. RV volume and pressure overload, severely reduced RV function, moderately enlarged. Most RV changes new since 2020 echo.    Resolved Hospital Problem list   Lactic acidosis hypokalemia  Assessment & Plan:   Submassive PE, likely noncompliant with Xarelto-- high risk PE with syncope, baseline HFrEF, lactic acidosis.  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 Refuses coumadin.  -Cardiology consult for chronic RH failure.   COPD without exacerbation  -Anoro -albuterol PRN  LUL 43m19mG lung nodule, enlarging  -Needs follow up CT in 3 months; likely not quite large enough to biopsy yet, but anticipate it will con't to grow. His acute PE related issues should be stabilized first.  Chronic HFrEF; baseline EF 40-45% History of paroxysmal Afib -con't current GCMT-- reduced dose entresto, spiro, digoxin, amiodarone -con't holding coreg -statin -montor on tele -can resume PTA farxiga -[facility] -con't tamsul[facility]  RLS -requip QHS  GERD -con't PPI (PTA med)  Hx prostate cancer  -OP follow  up; need to ensure he is up to date on his surveillance  Anxiety -PTA xanax PRN  Stable to transfer to the floor today and TRH's care tomorrow.   Best Practice (right click and "Reselect all SmartList Selections" daily)   Diet/type: Regular consistency (see orders) DVT prophylaxis: DOAC GI prophylaxis: PPI Lines: N/A Foley:  N/A Code Status:  DNR Last date of multidisciplinary goals of care discussion [ discussed code status  2/4, affirms DNR]  Labs   CBC: Recent Labs  Lab 04/09/22 1645 04/09/22 1805 04/10/22 0358 04/11/22 0622  WBC 8.8  --  7.4 11.1*  NEUTROABS 7.4  --   --   --   HGB 13.8 11.2* 13.3 12.0*  HCT 41.8 33.0* 39.4 35.3*  MCV 93.9  --  91.8 92.9  PLT 162  --  154 167     Basic Metabolic Panel: Recent Labs  Lab 04/09/22 1645 04/09/22 1805 04/10/22 0358 04/10/22 0901  NA 142 145 140 135  K 3.5 2.8* 4.8 4.4  CL 112*  --  110 108  CO2 18*  --  19* 16*  GLUCOSE 133*  --  206* 164*  BUN 12  --  15 17  CREATININE 1.07  --  0.97 0.98  CALCIUM 8.8*  --  9.6 9.4  MG 1.8  --   --   --     GFR: Estimated Creatinine Clearance: 69.2 mL/min (by C-G formula based on SCr of 0.98 mg/dL). Recent Labs  Lab 04/09/22 1645 04/09/22 2200 04/10/22 0032 04/10/22 0358 04/10/22 0901 04/11/22 0622  WBC 8.8  --   --  7.4  --  11.1*  LATICACIDVEN  --  5.4* 3.5*  --  2.4*  --      Liver Function Tests: Recent Labs  Lab 04/09/22 1645  AST 31  ALT 23  ALKPHOS 83  BILITOT 1.7*  PROT 6.4*  ALBUMIN 3.4*    No results for input(s): "LIPASE", "AMYLASE" in the last 168 hours. No results for input(s): "AMMONIA" in the last 168 hours.  ABG    Component Value Date/Time   PHART 7.299 (L) 10/06/2013 1603   PCO2ART 49.0 (H) 10/06/2013 1603   PO2ART 299.0 (H) 10/06/2013 1603   HCO3 14.8 (L) 04/09/2022 1805   TCO2 16 (L) 04/09/2022 1805   ACIDBASEDEF 9.0 (H) 04/09/2022 1805   O2SAT 71 04/09/2022 1805     Coagulation Profile: Recent Labs  Lab 04/09/22 1645  INR 1.4*      Critical care time:        Julian Hy, DO 04/11/22 10:27 AM  Pulmonary & Critical Care  For contact information, see Amion. If no response to pager, please call PCCM consult pager. After hours, 7PM- 7AM, please call Elink.

## 2022-04-11 NOTE — TOC Benefit Eligibility Note (Signed)
Patient Advocate Encounter  Insurance verification completed.    The patient is currently admitted and upon discharge could be taking Eliquis 5 mg.  The current 30 day co-pay is $0.00.   The patient is insured through [employer] Medicare Part D   Zamauri Nez, CPHT Pharmacy Patient Advocate Specialist [facility] Patient Advocate Team Direct Number: [phone]  Fax: [phone]       

## 2022-04-11 NOTE — Progress Notes (Signed)
Chief Complaint: Patient was seen today for follow up PE thrombectomy  Supervising Physician: Aletta Edouard  Patient Status: [name] - In-pt  Subjective: S/p PE thrombectomy yesterday. Feels okay today. Eager to get OOB.  Objective: Physical Exam: BP 108/73   Pulse 79   Temp 97.8 F (36.6 C) (Oral)   Resp (!) 24   Ht '6\' 1"'$  (1.854 m)   Wt 163 lb 2.3 oz (74 kg)   SpO2 94%   BMI 21.52 kg/m  A&O Lungs: CTA Heart: Reg Ext: (R)groin pursestring pressure suture intact. No hematoma. Suture removed without difficulty.   Current Facility-Administered Medications:    acetaminophen (TYLENOL) tablet 650 mg, 650 mg, Oral, Q6H PRN, Julian Hy, DO, 650 mg at 04/10/22 1817   albuterol (PROVENTIL) (2.5 MG/3ML) 0.083% nebulizer solution 2.5 mg, 2.5 mg, Nebulization, Q2H PRN, Bowser, Laurel Dimmer, NP   ALPRAZolam Duanne Moron) tablet 0.25 mg, 0.25 mg, Oral, QHS PRN, Julian Hy, DO, 0.25 mg at 04/10/22 1817   amiodarone (PACERONE) tablet 200 mg, 200 mg, Oral, Daily, Julian Hy, DO, 200 mg at 04/11/22 0845   Chlorhexidine Gluconate Cloth 2 % PADS 6 each, 6 each, Topical, Daily, Audria Nine, DO, 6 each at 04/10/22 1057   digoxin (LANOXIN) tablet 0.125 mg, 0.125 mg, Oral, Daily, Julian Hy, DO, 0.125 mg at 04/11/22 0845   docusate sodium (COLACE) capsule 100 mg, 100 mg, Oral, BID PRN, Dorris Singh, Laurel Dimmer, NP   heparin ADULT infusion 100 units/mL (25000 units/228m), 1,200 Units/hr, Intravenous, Continuous, Pham, Minh Q, RPH-CPP, Last Rate: 12 mL/hr at 04/11/22 0847, 1,200 Units/hr at 04/11/22 0847   Oral care mouth rinse, 15 mL, Mouth Rinse, 4 times per day, MRuthann Cancer Jessica, DO, 15 mL at 04/11/22 1124   Oral care mouth rinse, 15 mL, Mouth Rinse, PRN, MAudria Nine DO   pantoprazole (PROTONIX) EC tablet 40 mg, 40 mg, Oral, Daily, CNoemi ChapelP, DO, 40 mg at 04/11/22 0845   polyethylene glycol (MIRALAX / GLYCOLAX) packet 17 g, 17 g, Oral, Daily PRN, Bowser, GLaurel Dimmer NP    rOPINIRole (REQUIP) tablet 1 mg, 1 mg, Oral, QHS, CJulian Hy DO, 1 mg at 04/10/22 2102   rosuvastatin (CRESTOR) tablet 10 mg, 10 mg, Oral, Daily, Bowser, GLaurel Dimmer NP, 10 mg at 04/11/22 0845   sacubitril-valsartan (ENTRESTO) 24-26 mg per tablet, 1 tablet, Oral, BID, CJulian Hy DO, 1 tablet at 04/11/22 1124   spironolactone (ALDACTONE) tablet 25 mg, 25 mg, Oral, Daily, CNoemi ChapelP, DO, 25 mg at 04/11/22 0845   tamsulosin (FLOMAX) capsule 0.4 mg, 0.4 mg, Oral, QPC supper, CNoemi ChapelP, DO, 0.4 mg at 04/10/22 1817   umeclidinium-vilanterol (ANORO ELLIPTA) 62.5-25 MCG/ACT 1 puff, 1 puff, Inhalation, Daily, CJulian Hy DO, 1 puff at 04/11/22 07408 Labs: CBC Recent Labs    04/10/22 0358 04/11/22 0622  WBC 7.4 11.1*  HGB 13.3 12.0*  HCT 39.4 35.3*  PLT 154 167   BMET Recent Labs    04/10/22 0358 04/10/22 0901  NA 140 135  K 4.8 4.4  CL 110 108  CO2 19* 16*  GLUCOSE 206* 164*  BUN 15 17  CREATININE 0.97 0.98  CALCIUM 9.6 9.4   LFT Recent Labs    04/09/22 1645  PROT 6.4*  ALBUMIN 3.4*  AST 31  ALT 23  ALKPHOS 83  BILITOT 1.7*   PT/INR Recent Labs    04/09/22 1645  LABPROT 17.0*  INR 1.4*  Studies/Results: IR THROMBECT PRIM MECH INIT (INCLU) MOD SED  Result Date: 04/10/2022 CLINICAL DATA:  Pulmonary embolism, right heart strain with syncope/near syncope EXAM: 1. Ultrasound-guided puncture of the right common femoral vein 2. Catheterization and angiography of the main pulmonary artery 3. Selective catheterization and angiography of the right pulmonary artery 4. Mechanical thrombectomy of the right pulmonary artery 5. Measurement of pulmonary arterial pressures ANESTHESIA/SEDATION: Moderate (conscious) sedation was employed during this procedure. A total of Versed 1 mg and Fentanyl 25 mcg was administered intravenously. Moderate Sedation Time: 80 minutes. The patient's level of consciousness and vital signs were monitored continuously by radiology  nursing throughout the procedure under my direct supervision. MEDICATIONS: Documented in the EMR CONTRAST:  26m OMNIPAQUE IOHEXOL 300 MG/ML  SOLN PROCEDURE: Following a full explanation of the procedure including risks and benefits, written informed consent was obtained. A timeout was performed with all members of the treatment team present. Maximum sterile barrier technique was utilized. The patient was placed supine on the exam table. The right groin was prepped and draped in the standard sterile fashion. Ultrasound was used to evaluate the right common femoral vein, which found to be patent and suitable for access. An ultrasound image was permanently stored in the electronic medical record. Using ultrasound guidance, the right common femoral vein was directly punctured using a 21 gauge micropuncture set. An 018 wire was advanced centrally, followed by serial tract dilation and placement of a 8 French sheath. Using a combination of an angled pigtail catheter and tip deflecting wire, the main pulmonary artery was catheterized. Gentle hand injection of contrast material confirmed location within the main pulmonary artery. Suspected large filling defect in the distal right pulmonary artery was identified, consistent recent CT pulmonary angiogram findings. Main pulmonary arterial pressures were then transduced, as detailed below. Using a combination of the angled pigtail catheter and Rosen wire, the right pulmonary artery was selected. The pigtail catheter was then exchanged for an angled catheter, which was used to select the right descending interlobar pulmonary artery. Wire was then exchanged for a short tapered Amplatz wire. Over this wire, the access site was serially dilated to accommodate a 24 [place] sheath. The [place] 24 [place] thrombectomy catheter was then advanced into the right pulmonary artery. Mechanical thrombectomy was then performed with multiple passes. Moderate amount of chronic  and subacute clot was removed. Repeat angiogram of the right artery demonstrated persistent filling defect in the right descending interlobar pulmonary artery. The suction thrombectomy catheter was then readvanced into the clot, with additional mechanical fragmentation of the chronic elements. Suction thrombectomy was repeated, with moderate improvement in patency of the right descending interlobar pulmonary artery on repeat angiography. Final PA pressures were again transduced, as detailed below. At the end of the procedure, all wires and catheters were removed. Hemostasis was achieved at the access site using a pursestring suture. The patient tolerated the procedure well without immediate complication. COMPLICATIONS: None immediate FINDINGS: Pre PA pressure: 71/23 (mean 44) mm Hg Post PA pressure: 69/23 (mean 42) mm Hg IMPRESSION: 1. Technically successful mechanical thrombectomy of the right pulmonary artery. Patient reported subjective improvement in shortness of breath following thrombectomy, with mild improvement in vital signs including reduction in heart rate and respiratory rate. Only mild improvement in the patient's pulmonary arterial hypertension was observed, suggestive of underlying pulmonary arterial hypertension and/or chronic PE. 2. Moderate amount of chronic and subacute clot was removed. 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 Electronically Signed   By: Albin Felling M.D.   On: 04/10/2022 15:08   IR US Guide Vasc Access Right  Result Date: 04/10/2022 CLINICAL DATA:  Pulmonary embolism, right heart strain with syncope/near syncope EXAM: 1. Ultrasound-guided puncture of the right common femoral vein 2. Catheterization and angiography of the main pulmonary artery 3. Selective catheterization and angiography of the right pulmonary  artery 4. Mechanical thrombectomy of the right pulmonary artery 5. Measurement of pulmonary arterial pressures ANESTHESIA/SEDATION: Moderate (conscious) sedation was employed during this procedure. A total of Versed 1 mg and Fentanyl 25 mcg was administered intravenously. Moderate Sedation Time: 80 minutes. The patient's level of consciousness and vital signs were monitored continuously by radiology nursing throughout the procedure under my direct supervision. MEDICATIONS: Documented in the EMR CONTRAST:  67m OMNIPAQUE IOHEXOL 300 MG/ML  SOLN PROCEDURE: Following a full explanation of the procedure including risks and benefits, written informed consent was obtained. A timeout was performed with all members of the treatment team present. Maximum sterile barrier technique was utilized. The patient was placed supine on the exam table. The right groin was prepped and draped in the standard sterile fashion. Ultrasound was used to evaluate the right common femoral vein, which found to be patent and suitable for access. An ultrasound image was permanently stored in the electronic medical record. Using ultrasound guidance, the right common femoral vein was directly punctured using a 21 gauge micropuncture set. An 018 wire was advanced centrally, followed by serial tract dilation and placement of a 8 French sheath. Using a combination of an angled pigtail catheter and tip deflecting wire, the main pulmonary artery was catheterized. Gentle hand injection of contrast material confirmed location within the main pulmonary artery. Suspected large filling defect in the distal right pulmonary artery was identified, consistent recent CT pulmonary angiogram findings. Main pulmonary arterial pressures were then transduced, as detailed below. Using a combination of the angled pigtail catheter and Rosen wire, the right pulmonary artery was selected. The pigtail catheter was then exchanged for an angled catheter, which was used to  select the right descending interlobar pulmonary artery. Wire was then exchanged for a short tapered Amplatz wire. Over this wire, the access site was serially dilated to accommodate a 24 [place] sheath. The [place]ari 24 [place]chanical thrombectomy catheter was then advanced into the right pulmonary artery. Mechanical thrombectomy was then performed with multiple passes. Moderate amount of chronic and subacute clot was removed. Repeat angiogram of the right artery demonstrated persistent filling defect in the right descending interlobar pulmonary artery. The suction thrombectomy catheter was then readvanced into the clot, with additional mechanical fragmentation of the chronic elements. Suction thrombectomy was repeated, with moderate improvement in patency of the right descending interlobar pulmonary artery on repeat angiography. Final PA pressures were again transduced, as detailed below. At the end of the procedure, all wires and catheters were removed. Hemostasis was achieved at the access site using a pursestring suture. The patient tolerated the procedure well without immediate complication. COMPLICATIONS: None immediate FINDINGS: Pre PA pressure: 71/23 (mean 44) mm Hg Post PA pressure: 69/23 (mean 42) mm Hg IMPRESSION: 1. Technically successful mechanical thrombectomy of the right pulmonary artery. Patient reported subjective improvement in shortness of breath following thrombectomy, with mild improvement in vital signs including reduction in heart rate and respiratory rate. Only mild improvement in the patient's pulmonary arterial hypertension was observed, suggestive of underlying pulmonary arterial hypertension  and/or chronic PE. 2. Moderate amount of chronic and subacute clot was removed. Final angiography demonstrates a significant element of chronic pulmonary embolism, particularly in the right descending interlobar pulmonary artery, which was not amenable to catheter directed therapy.  Electronically Signed   By: Albin Felling M.D.   On: 04/10/2022 15:08   IR Angiogram Selective Each Additional Vessel  Result Date: 04/10/2022 CLINICAL DATA:  Pulmonary embolism, right heart strain with syncope/near syncope EXAM: 1. Ultrasound-guided puncture of the right common femoral vein 2. Catheterization and angiography of the main pulmonary artery 3. Selective catheterization and angiography of the right pulmonary artery 4. Mechanical thrombectomy of the right pulmonary artery 5. Measurement of pulmonary arterial pressures ANESTHESIA/SEDATION: Moderate (conscious) sedation was employed during this procedure. A total of Versed 1 mg and Fentanyl 25 mcg was administered intravenously. Moderate Sedation Time: 80 minutes. The patient's level of consciousness and vital signs were monitored continuously by radiology nursing throughout the procedure under my direct supervision. MEDICATIONS: Documented in the EMR CONTRAST:  71m OMNIPAQUE IOHEXOL 300 MG/ML  SOLN PROCEDURE: Following a full explanation of the procedure including risks and benefits, written informed consent was obtained. A timeout was performed with all members of the treatment team present. Maximum sterile barrier technique was utilized. The patient was placed supine on the exam table. The right groin was prepped and draped in the standard sterile fashion. Ultrasound was used to evaluate the right common femoral vein, which found to be patent and suitable for access. An ultrasound image was permanently stored in the electronic medical record. Using ultrasound guidance, the right common femoral vein was directly punctured using a 21 gauge micropuncture set. An 018 wire was advanced centrally, followed by serial tract dilation and placement of a 8 French sheath. Using a combination of an angled pigtail catheter and tip deflecting wire, the main pulmonary artery was catheterized. Gentle hand injection of contrast material confirmed location within the  main pulmonary artery. Suspected large filling defect in the distal right pulmonary artery was identified, consistent recent CT pulmonary angiogram findings. Main pulmonary arterial pressures were then transduced, as detailed below. Using a combination of the angled pigtail catheter and Rosen wire, the right pulmonary artery was selected. The pigtail catheter was then exchanged for an angled catheter, which was used to select the right descending interlobar pulmonary artery. Wire was then exchanged for a short tapered Amplatz wire. Over this wire, the access site was serially dilated to accommodate a 24 [place] sheath. The Inari 24 [place]chanical thrombectomy catheter was then advanced into the right pulmonary artery. Mechanical thrombectomy was then performed with multiple passes. Moderate amount of chronic and subacute clot was removed. Repeat angiogram of the right artery demonstrated persistent filling defect in the right descending interlobar pulmonary artery. The suction thrombectomy catheter was then readvanced into the clot, with additional mechanical fragmentation of the chronic elements. Suction thrombectomy was repeated, with moderate improvement in patency of the right descending interlobar pulmonary artery on repeat angiography. Final PA pressures were again transduced, as detailed below. At the end of the procedure, all wires and catheters were removed. Hemostasis was achieved at the access site using a pursestring suture. The patient tolerated the procedure well without immediate complication. COMPLICATIONS: None immediate FINDINGS: Pre PA pressure: 71/23 (mean 44) mm Hg Post PA pressure: 69/23 (mean 42) mm Hg IMPRESSION: 1. Technically successful mechanical thrombectomy of the right pulmonary artery. Patient reported subjective improvement in shortness of breath following thrombectomy, with mild improvement in vital  signs including reduction in heart rate and respiratory rate. Only mild  improvement in the patient's pulmonary arterial hypertension was observed, suggestive of underlying pulmonary arterial hypertension and/or chronic PE. 2. Moderate amount of chronic and subacute clot was removed. Final angiography demonstrates a significant element of chronic pulmonary embolism, particularly in the right descending interlobar pulmonary artery, which was not amenable to catheter directed therapy. Electronically Signed   By: Albin Felling M.D.   On: 04/10/2022 15:08   IR Angiogram Pulmonary Right Selective  Result Date: 04/10/2022 CLINICAL DATA:  Pulmonary embolism, right heart strain with syncope/near syncope EXAM: 1. Ultrasound-guided puncture of the right common femoral vein 2. Catheterization and angiography of the main pulmonary artery 3. Selective catheterization and angiography of the right pulmonary artery 4. Mechanical thrombectomy of the right pulmonary artery 5. Measurement of pulmonary arterial pressures ANESTHESIA/SEDATION: Moderate (conscious) sedation was employed during this procedure. A total of Versed 1 mg and Fentanyl 25 mcg was administered intravenously. Moderate Sedation Time: 80 minutes. The patient's level of consciousness and vital signs were monitored continuously by radiology nursing throughout the procedure under my direct supervision. MEDICATIONS: Documented in the EMR CONTRAST:  78m OMNIPAQUE IOHEXOL 300 MG/ML  SOLN PROCEDURE: Following a full explanation of the procedure including risks and benefits, written informed consent was obtained. A timeout was performed with all members of the treatment team present. Maximum sterile barrier technique was utilized. The patient was placed supine on the exam table. The right groin was prepped and draped in the standard sterile fashion. Ultrasound was used to evaluate the right common femoral vein, which found to be patent and suitable for access. An ultrasound image was permanently stored in the electronic medical record. Using  ultrasound guidance, the right common femoral vein was directly punctured using a 21 gauge micropuncture set. An 018 wire was advanced centrally, followed by serial tract dilation and placement of a 8 French sheath. Using a combination of an angled pigtail catheter and tip deflecting wire, the main pulmonary artery was catheterized. Gentle hand injection of contrast material confirmed location within the main pulmonary artery. Suspected large filling defect in the distal right pulmonary artery was identified, consistent recent CT pulmonary angiogram findings. Main pulmonary arterial pressures were then transduced, as detailed below. Using a combination of the angled pigtail catheter and Rosen wire, the right pulmonary artery was selected. The pigtail catheter was then exchanged for an angled catheter, which was used to select the right descending interlobar pulmonary artery. Wire was then exchanged for a short tapered Amplatz wire. Over this wire, the access site was serially dilated to accommodate a 24 [place] sheath. The Inari 24 [place]chanical thrombectomy catheter was then advanced into the right pulmonary artery. Mechanical thrombectomy was then performed with multiple passes. Moderate amount of chronic and subacute clot was removed. Repeat angiogram of the right artery demonstrated persistent filling defect in the right descending interlobar pulmonary artery. The suction thrombectomy catheter was then readvanced into the clot, with additional mechanical fragmentation of the chronic elements. Suction thrombectomy was repeated, with moderate improvement in patency of the right descending interlobar pulmonary artery on repeat angiography. Final PA pressures were again transduced, as detailed below. At the end of the procedure, all wires and catheters were removed. Hemostasis was achieved at the access site using a pursestring suture. The patient tolerated the procedure well without immediate  complication. COMPLICATIONS: None immediate FINDINGS: Pre PA pressure: 71/23 (mean 44) mm Hg Post PA pressure: 69/23 (mean 42) mm Hg  IMPRESSION: 1. Technically successful mechanical thrombectomy of the right pulmonary artery. Patient reported subjective improvement in shortness of breath following thrombectomy, with mild improvement in vital signs including reduction in heart rate and respiratory rate. Only mild improvement in the patient's pulmonary arterial hypertension was observed, suggestive of underlying pulmonary arterial hypertension and/or chronic PE. 2. Moderate amount of chronic and subacute clot was removed. Final angiography demonstrates a significant element of chronic pulmonary embolism, particularly in the right descending interlobar pulmonary artery, which was not amenable to catheter directed therapy. Electronically Signed   By: Albin Felling M.D.   On: 04/10/2022 15:08   VAS [place] LOWER EXTREMITY VENOUS (DVT)  Result Date: 04/10/2022  Lower Venous DVT Study Patient Name:  Bruce [name]  Date of Exam:   04/10/2022 Medical Rec #: [id number]       Accession #:    [id number] Date of Birth: 12-16-1945       Patient Gender: M Patient Age:   42 years Exam Location:  [facility] Procedure:      VAS [place] LOWER EXTREMITY VENOUS (DVT) Referring Phys: Regan Lemming --------------------------------------------------------------------------------  Indications: Pulmonary embolism.  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 Anticoagulation: Xarelto prior to admission. Limitations: Poor ultrasound/tissue interface. Comparison Study: Previous exam at [facility] on 05/27/08 was positive for DVT Performing Technologist: Rogelia Rohrer RVT, RDMS  Examination Guidelines: A complete evaluation includes B-mode imaging, spectral Doppler, color Doppler, and power Doppler as needed of all accessible portions of each vessel. Bilateral testing is considered an integral part of a complete examination. Limited examinations for  reoccurring indications may be performed as noted. The reflux portion of the exam is performed with the patient in reverse Trendelenburg.  +---------+---------------+---------+-----------+----------+-------------------+ RIGHT    CompressibilityPhasicitySpontaneityPropertiesThrombus Aging      +---------+---------------+---------+-----------+----------+-------------------+ CFV      Partial        No       Yes                  Acute               +---------+---------------+---------+-----------+----------+-------------------+ SFJ      Partial                                      Acute               +---------+---------------+---------+-----------+----------+-------------------+ FV Prox  None           No       No                   Age Indeterminate   +---------+---------------+---------+-----------+----------+-------------------+ FV Mid   None           No       Yes                  Age Indeterminate   +---------+---------------+---------+-----------+----------+-------------------+ FV DistalNone           No       No                   Age Indeterminate   +---------+---------------+---------+-----------+----------+-------------------+ PFV      Full                                                             +---------+---------------+---------+-----------+----------+-------------------+  POP      Partial        No       No                                       +---------+---------------+---------+-----------+----------+-------------------+ PTV      Full                                         Not well visualized +---------+---------------+---------+-----------+----------+-------------------+ PERO                                                  Not well visualized +---------+---------------+---------+-----------+----------+-------------------+   +---------+---------------+---------+-----------+----------+-----------------+ LEFT      CompressibilityPhasicitySpontaneityPropertiesThrombus Aging    +---------+---------------+---------+-----------+----------+-----------------+ CFV      Full           No       Yes                                    +---------+---------------+---------+-----------+----------+-----------------+ SFJ      Full                                                           +---------+---------------+---------+-----------+----------+-----------------+ FV Prox  Full           No       Yes                                    +---------+---------------+---------+-----------+----------+-----------------+ FV Mid   Full           No       Yes                                    +---------+---------------+---------+-----------+----------+-----------------+ FV DistalNone           No       No                   Age Indeterminate +---------+---------------+---------+-----------+----------+-----------------+ PFV      Full                                                           +---------+---------------+---------+-----------+----------+-----------------+ POP      Partial        No       Yes                  Age Indeterminate +---------+---------------+---------+-----------+----------+-----------------+ PTV      Full                                                           +---------+---------------+---------+-----------+----------+-----------------+  PERO     Full                                                           +---------+---------------+---------+-----------+----------+-----------------+     Summary: BILATERAL: -No evidence of popliteal cyst, bilaterally. RIGHT: - Findings consistent with acute deep vein thrombosis involving the right common femoral vein, and SF junction. - Findings consistent with age indeterminate deep vein thrombosis involving the right femoral vein, and right popliteal vein.  LEFT: - Findings consistent with age indeterminate deep vein  thrombosis involving the left femoral vein, and left popliteal vein.  *See table(s) above for measurements and observations. Electronically signed by Servando Snare MD on 04/10/2022 at 2:39:46 PM.    Final    ECHOCARDIOGRAM COMPLETE  Result Date: 04/10/2022    ECHOCARDIOGRAM REPORT   Patient Name:   Bruce [name] Date of Exam: 04/10/2022 Medical Rec #:  [id number]      Height:       73.0 in Accession #:    [id number]     Weight:       160.9 lb Date of Birth:  06-Oct-1945      BSA:          1.962 m Patient Age:    77 years       BP:           137/92 mmHg Patient Gender: M              HR:           102 bpm. Exam Location:  Inpatient Procedure: 2D Echo Indications:    pulmonary embolus  History:        Patient has prior history of Echocardiogram examinations, most                 recent 12/31/2018. CAD, COPD, Signs/Symptoms:Shortness of Breath                 and Chest Pain; Risk Factors:Hypertension and Dyslipidemia.  Sonographer:    Johny Chess RDCS Referring Phys: Eliseo Gum, E  Sonographer Comments: Image acquisition challenging due to respiratory motion and Image acquisition challenging due to COPD. IMPRESSIONS  1. Left ventricular ejection fraction, by estimation, is 20 to 25%. The left ventricle has severely decreased function. The left ventricle demonstrates global hypokinesis. The left ventricular internal cavity size was mildly dilated. Left ventricular diastolic parameters are consistent with Grade I diastolic dysfunction (impaired relaxation).  2. Mildly D-shaped interventricular septum suggests RV pressure/volume overload. Right ventricular systolic function is severely reduced. The right ventricular size is moderately enlarged. Tricuspid regurgitation signal is inadequate for assessing PA pressure.  3. The mitral valve is normal in structure. No evidence of mitral valve regurgitation. No evidence of mitral stenosis.  4. The tricuspid valve is abnormal. Tricuspid valve regurgitation is mild to  moderate.  5. The aortic valve is tricuspid. There is mild calcification of the aortic valve. Aortic valve regurgitation is not visualized. No aortic stenosis is present.  6. The inferior vena cava is dilated in size with <50% respiratory variability, suggesting right atrial pressure of 15 mmHg. FINDINGS  Left Ventricle: Left ventricular ejection fraction, by estimation, is 20 to 25%. The left ventricle has severely decreased function. The left ventricle demonstrates global hypokinesis. The left ventricular internal cavity  size was mildly dilated. There is no left ventricular hypertrophy. Left ventricular diastolic parameters are consistent with Grade I diastolic dysfunction (impaired relaxation). Right Ventricle: Mildly D-shaped interventricular septum suggests RV pressure/volume overload. The right ventricular size is moderately enlarged. No increase in right ventricular wall thickness. Right ventricular systolic function is severely reduced. Tricuspid regurgitation signal is inadequate for assessing PA pressure. Left Atrium: Left atrial size was normal in size. Right Atrium: Right atrial size was normal in size. Pericardium: There is no evidence of pericardial effusion. Mitral Valve: The mitral valve is normal in structure. There is mild calcification of the mitral valve leaflet(s). Mild mitral annular calcification. No evidence of mitral valve regurgitation. No evidence of mitral valve stenosis. Tricuspid Valve: The tricuspid valve is abnormal. Tricuspid valve regurgitation is mild to moderate. Aortic Valve: The aortic valve is tricuspid. There is mild calcification of the aortic valve. Aortic valve regurgitation is not visualized. No aortic stenosis is present. Pulmonic Valve: The pulmonic valve was normal in structure. Pulmonic valve regurgitation is trivial. Aorta: The aortic root is normal in size and structure. Venous: The inferior vena cava is dilated in size with less than 50% respiratory variability,  suggesting right atrial pressure of 15 mmHg. IAS/Shunts: No atrial level shunt detected by color flow Doppler.  LEFT VENTRICLE PLAX 2D LVIDd:         5.60 cm LVIDs:         4.80 cm LV PW:         0.80 cm LV IVS:        0.90 cm LVOT diam:     2.00 cm LV SV:         25 LV SV Index:   13 LVOT Area:     3.14 cm  LV Volumes (MOD) LV vol d, MOD A4C: 91.7 ml LV vol s, MOD A4C: 71.2 ml LV SV MOD A4C:     91.7 ml RIGHT VENTRICLE            IVC RV Basal diam:  3.20 cm    IVC diam: 2.60 cm RV S prime:     6.96 cm/s TAPSE (M-mode): 0.8 cm LEFT ATRIUM           Index        RIGHT ATRIUM           Index LA diam:      3.30 cm 1.68 cm/m   RA Area:     18.10 cm LA Vol (A4C): 47.7 ml 24.31 ml/m  RA Volume:   58.40 ml  29.77 ml/m  AORTIC VALVE LVOT Vmax:   57.00 cm/s LVOT Vmean:  35.600 cm/s LVOT VTI:    0.081 m  AORTA Ao Root diam: 3.30 cm  SHUNTS Systemic VTI:  0.08 m Systemic Diam: 2.00 cm Dalton McleanMD Electronically signed by Franki Monte Signature Date/Time: 04/10/2022/12:58:10 PM    Final    CT Angio Chest PE W and/or Wo Contrast  Result Date: 04/09/2022 CLINICAL DATA:  Short of breath, hypoxia EXAM: CT ANGIOGRAPHY CHEST WITH CONTRAST TECHNIQUE: Multidetector CT imaging of the chest was performed using the standard protocol during bolus administration of intravenous contrast. Multiplanar CT image reconstructions and MIPs were obtained to evaluate the vascular anatomy. RADIATION DOSE REDUCTION: This exam was performed according to the departmental dose-optimization program which includes automated exposure control, adjustment of the mA and/or kV according to patient size and/or use of iterative reconstruction technique. CONTRAST:  63m OMNIPAQUE IOHEXOL 350 MG/ML SOLN COMPARISON:  04/09/2022, 05/02/2020 FINDINGS: Cardiovascular: This is a technically adequate evaluation of the pulmonary vasculature. There are bilateral central and segmental pulmonary emboli, with large clot burden primarily within the right main  pulmonary artery. There is evidence of right heart strain, with the RV/LV ratio measuring 1.16. No pericardial effusion. Normal caliber of the thoracic aorta. Atherosclerosis of the aorta and coronary vasculature. Mediastinum/Nodes: No enlarged mediastinal, hilar, or axillary lymph nodes. Thyroid gland, trachea, and esophagus demonstrate no significant findings. Lungs/Pleura: Background emphysema and scattered areas of scarring are stable. No acute airspace disease, effusion, or pneumothorax. Central airways are patent. 6 mm ground-glass nodule is again seen within the left lobe reference image 88/6, increased since prior study where this had measured approximately 4 mm by my measurement. No new pulmonary nodules or masses. Upper Abdomen: No acute abnormality. Musculoskeletal: No acute or destructive bony lesions. Reconstructed images demonstrate no additional findings. Review of the MIP images confirms the above findings. IMPRESSION: 1. Extensive bilateral central and segmental pulmonary emboli, with CT evidence of right heart strain (RV/LV Ratio = 1.16) consistent with at least submassive (intermediate risk) PE. The presence of right heart strain has been associated with an increased risk of morbidity and mortality. Please refer to the "Code PE Focused" order set in EPIC. 2. 6 mm left upper lobe ground-glass nodule, increased since prior exam from 2 years ago where this had measured approximately 4 mm by my measurement. Adenocarcinoma cannot be excluded given increase in size, and thoracic surgery consultation could be considered. If this area is not resected, CT chest follow-up in 2 years is recommended. This recommendation follows the consensus statement: Guidelines for Management of Incidental Pulmonary Nodules Detected on CT Images: From the Fleischner Society 2017; Radiology 2017; 284:228-243. 3. Aortic Atherosclerosis (ICD10-I70.0) and Emphysema (ICD10-J43.9). Critical Value/emergent results were called by  telephone at the time of interpretation on 04/09/2022 at 8:20 pm to provider [facility] , who verbally acknowledged these results. Electronically Signed   By: Randa Ngo M.D.   On: 04/09/2022 20:25   DG Chest Portable 1 View  Result Date: 04/09/2022 CLINICAL DATA:  Short of breath EXAM: PORTABLE CHEST 1 VIEW COMPARISON:  03/11/2021 FINDINGS: Two frontal views of the chest demonstrate a stable cardiac silhouette. Chronic prominence of the central pulmonary vasculature. No acute airspace disease, effusion, or pneumothorax. Mild background scarring unchanged. No acute bony abnormality. IMPRESSION: 1. No acute intrathoracic process. Electronically Signed   By: Randa Ngo M.D.   On: 04/09/2022 17:21    Assessment/Plan: S/p PE thrombectomy yesterday Clinically improved. Pursestring suture removed from right CFV site No restrictions to activity from IR standpoint.    LOS: 2 days   I spent a total of 15 minutes in face to face in clinical consultation, greater than 50% of which was counseling/coordinating care for PE thrombectomy  Ascencion Dike PA-C 04/11/2022 12:44 PM

## 2022-04-11 NOTE — Progress Notes (Signed)
ANTICOAGULATION CONSULT NOTE - Consult  Pharmacy Consult for Heparin Indication: pulmonary embolus  Allergies  Allergen Reactions   Adhesive [Tape] Other (See Comments)    After right leg fracture surgery, pt developed a large blister where tape was applied to his right leg. OK to use paper tape.   Imdur [Isosorbide Dinitrate] Other (See Comments)    hallucinations   Singulair [Montelukast Sodium] Other (See Comments)    Hallucinations     Patient Measurements: Height: '6\' 1"'$  (185.4 cm) Weight: 74 kg (163 lb 2.3 oz) IBW/kg (Calculated) : 79.9 Heparin Dosing Weight: 84.4 kg  Vital Signs: Temp: 97.9 F (36.6 C) (02/06 0000) Temp Source: Oral (02/06 0000) BP: 106/76 (02/06 0700) Pulse Rate: 84 (02/06 0700)  Labs: Recent Labs    04/09/22 1645 04/09/22 1805 04/09/22 1838 04/09/22 1935 04/10/22 0358 04/10/22 0901 04/10/22 1344 04/10/22 1945 04/11/22 0622  HGB 13.8 11.2*  --   --  13.3  --   --   --  12.0*  HCT 41.8 33.0*  --   --  39.4  --   --   --  35.3*  PLT 162  --   --   --  154  --   --   --  167  APTT  --   --   --    < > 139* 100*  --   --  138*  LABPROT 17.0*  --   --   --   --   --   --   --   --   INR 1.4*  --   --   --   --   --   --   --   --   HEPARINUNFRC  --   --   --    < > 1.01* 0.92* 1.03* 0.62 1.00*  CREATININE 1.07  --   --   --  0.97 0.98  --   --   --   TROPONINIHS 231*  --  232*  --   --  187*  --   --   --    < > = values in this interval not displayed.     Estimated Creatinine Clearance: 69.2 mL/min (by C-G formula based on SCr of 0.98 mg/dL).   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   UTI (urinary tract infection)    frequent UTI    Medications:  Medications Prior to Admission  Medication Sig Dispense Refill Last Dose   albuterol (PROVENTIL HFA;VENTOLIN HFA) 108 (90 Base) MCG/ACT inhaler Inhale 2 puffs into the lungs every 6 (six) hours as needed  for wheezing or shortness of breath. 1 Inhaler 2 04/09/2022 at 1300   ALPRAZolam (XANAX) 0.25 MG tablet Take 0.25 mg by mouth daily as needed for anxiety.   UNKNOWN   amiodarone (PACERONE) 200 MG tablet Take 200-400 mg by mouth See admin instructions. Take 2 tablets by mouth twice a daily for 1 week, then 3 tablets for 1 week, then 1 tablet twice daily for 1 week until next appointment   04/07/2022   carvedilol (COREG) 12.5 MG tablet Take 1 tablet (12.5 mg total) by mouth 2 (two) times daily. 60 tablet 0 04/07/2022 at 0845   cetirizine (ZYRTEC) 10 MG tablet Take 10 mg by mouth daily.   04/07/2022   digoxin (LANOXIN) 0.125 MG tablet Take 0.125 mg by mouth daily.   04/07/2022 at 0845   ENTRESTO 49-51 MG Take 1 tablet by mouth 2 (two) times daily.   04/07/2022 at [place] 10 MG TABS tablet Take 10 mg by mouth daily.   04/07/2022 at am   fluticasone (FLONASE) 50 MCG/ACT nasal spray Place 1 spray into both nostrils daily.   04/09/2022 at am   furosemide (LASIX) 20 MG tablet Take 20 mg by mouth daily.   04/07/2022 at am   Multiple Vitamin (MULTIVITAMIN) tablet Take 1 tablet by mouth daily. For Men   04/07/2022 at am   nitroGLYCERIN (NITROSTAT) 0.4 MG SL tablet Place 0.4 mg under the tongue every 5 (five) minutes as needed for chest pain.    UNKNOWN   Omega-3 Fatty Acids (FISH OIL PO) Take 1,000 mg  by mouth every evening.    04/06/2022 at pm   omeprazole (PRILOSEC) 40 MG capsule Take 40 mg by mouth daily.   04/07/2022 at am   ranolazine (RANEXA) 500 MG 12 hr tablet Take 500 mg by mouth 2 (two) times daily.   04/07/2022 at am   rivaroxaban (XARELTO) 20 MG TABS tablet Take 1 tablet (20 mg total) by mouth daily. 30 tablet 0 04/06/2022 at 2030   rOPINIRole (REQUIP) 1 MG tablet Take 1 mg by mouth at bedtime.   04/06/2022 at pm   rosuvastatin (CRESTOR) 10 MG tablet Take 10 mg by mouth daily.   04/06/2022 at pm   spironolactone (ALDACTONE) 25 MG tablet Take 25 mg by mouth daily.   04/07/2022 at am   tamsulosin (FLOMAX) 0.4 MG CAPS capsule TAKE 1 CAPSULE BY MOUTH ONCE DAILY AFTERSUPPER (Patient taking differently: Take 0.4 mg by mouth daily after supper.) 30 capsule 11 04/06/2022 at pm    Scheduled:   amiodarone  200 mg Oral Daily   Chlorhexidine Gluconate Cloth  6 each Topical Daily   digoxin  0.125 mg Oral Daily   mouth rinse  15 mL Mouth Rinse 4 times per day   pantoprazole  40 mg Oral Daily   rOPINIRole  1 mg Oral QHS   rosuvastatin  10 mg Oral Daily   sacubitril-valsartan  1 tablet Oral BID   spironolactone  25 mg Oral Daily   tamsulosin  0.4 mg Oral QPC supper   umeclidinium-vilanterol  1 puff Inhalation Daily   Infusions:   heparin     PRN:   Assessment: 41 yom presenting with near syncope and SOB. Heparin per pharmacy consult placed for pulmonary embolus.  Patient prescribed xarelto pta with Hx PE Last dose stated 2/1 Will monitor aPTT and heparin level secondary to DOAC use -for now  Heparin level this am 1.01 and aptt 139 sec (redraw to confirm drawn  correctly heparin level 0.9 and aptt 100 sec)  - slightly > goal  Heparin drip rate decreased 1350 uts/hr  Patient went to IR for thrombectomy today  Received heparin 2000 uts IV x1 in procedure  Post procedure heparin level 1.03 - drawn right after procedure with bolus Will not make changes and follow up heparin level later tonight    Heparin level came back supratherapeutic this AM. Levels are correlating now. No bleeding per Rn.   Goal of Therapy:  Heparin level 0.3-0.7 units/ml aPTT 66-102 seconds Monitor platelets by anticoagulation protocol: Yes   Plan: Hold heparin x30 minutes Decrease heparin infusion 1200 units/hr 6 hr heparin level  Continue to monitor H&H and platelets  Onnie Boer, PharmD, BCIDP, AAHIVP, CPP Infectious Disease Pharmacist 04/11/2022 7:52 AM

## 2022-04-11 NOTE — Progress Notes (Signed)
ANTICOAGULATION CONSULT NOTE - Consult  Pharmacy Consult for Heparin Indication: pulmonary embolus  Allergies  Allergen Reactions   Adhesive [Tape] Other (See Comments)    After right leg fracture surgery, pt developed a large blister where tape was applied to his right leg. OK to use paper tape.   Imdur [Isosorbide Dinitrate] Other (See Comments)    hallucinations   Singulair [Montelukast Sodium] Other (See Comments)    Hallucinations     Patient Measurements: Height: '6\' 1"'$  (185.4 cm) Weight: 74 kg (163 lb 2.3 oz) IBW/kg (Calculated) : 79.9 Heparin Dosing Weight: 84.4 kg  Vital Signs: Temp: 97.8 F (36.6 C) (02/06 1122) Temp Source: Oral (02/06 1122) BP: 110/74 (02/06 1500) Pulse Rate: 79 (02/06 1500)  Labs: Recent Labs    04/09/22 1645 04/09/22 1805 04/09/22 1838 04/09/22 1935 04/10/22 0358 04/10/22 0901 04/10/22 1344 04/10/22 1945 04/11/22 0622 04/11/22 1514  HGB 13.8 11.2*  --   --  13.3  --   --   --  12.0*  --   HCT 41.8 33.0*  --   --  39.4  --   --   --  35.3*  --   PLT 162  --   --   --  154  --   --   --  167  --   APTT  --   --   --    < > 139* 100*  --   --  138*  --   LABPROT 17.0*  --   --   --   --   --   --   --   --   --   INR 1.4*  --   --   --   --   --   --   --   --   --   HEPARINUNFRC  --   --   --    < > 1.01* 0.92*   < > 0.62 1.00* 0.63  CREATININE 1.07  --   --   --  0.97 0.98  --   --   --   --   TROPONINIHS 231*  --  232*  --   --  187*  --   --   --   --    < > = values in this interval not displayed.     Estimated Creatinine Clearance: 69.2 mL/min (by C-G formula based on SCr of 0.98 mg/dL).   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)    '09 AND '12   Peripheral vascular disease (HCC)    Shortness of breath    With exertion .   UTI (urinary tract infection)    frequent UTI    Medications:  Medications Prior to Admission  Medication Sig Dispense Refill Last Dose   albuterol (PROVENTIL HFA;VENTOLIN HFA) 108 (90 Base) MCG/ACT inhaler Inhale 2 puffs into the lungs every 6 (six) hours as needed for wheezing or shortness of breath. 1 Inhaler 2 04/09/2022 at 1300   ALPRAZolam (XANAX) 0.25 MG tablet Take 0.25 mg by mouth daily as needed for anxiety.   UNKNOWN   amiodarone (PACERONE) 200 MG tablet Take 200-400 mg by mouth See admin instructions. Take 2 tablets by mouth twice a daily for 1 week, then 3 tablets for 1 week, then 1 tablet twice daily for 1 week until next appointment   04/07/2022   carvedilol (COREG) 12.5 MG tablet Take 1 tablet (12.5 mg total) by mouth 2 (two) times daily. 60 tablet 0 04/07/2022 at 0845   cetirizine (ZYRTEC) 10 MG tablet Take 10 mg by mouth daily.   04/07/2022   digoxin (LANOXIN) 0.125 MG tablet Take 0.125 mg by mouth daily.   04/07/2022 at 0845   ENTRESTO 49-51 MG Take 1 tablet by mouth 2 (two) times daily.   04/07/2022 at [place] 10 MG TABS tablet Take 10 mg by mouth daily.   04/07/2022 at am   fluticasone (FLONASE) 50 MCG/ACT nasal spray Place 1 spray into both nostrils daily.   04/09/2022 at am   furosemide (LASIX) 20 MG tablet Take 20 mg by mouth daily.   04/07/2022 at am   Multiple Vitamin (MULTIVITAMIN) tablet Take 1 tablet by mouth daily. For Men   04/07/2022 at am   nitroGLYCERIN (NITROSTAT) 0.4 MG SL tablet Place 0.4 mg under the tongue every 5 (five) minutes as needed for chest pain.     UNKNOWN   Omega-3 Fatty Acids (FISH OIL PO) Take 1,000 mg by mouth every evening.    04/06/2022 at pm   omeprazole (PRILOSEC) 40 MG capsule Take 40 mg by mouth daily.   04/07/2022 at am   ranolazine (RANEXA) 500 MG 12 hr tablet Take 500 mg by mouth 2 (two) times daily.   04/07/2022 at am   rivaroxaban (XARELTO) 20 MG TABS tablet Take 1 tablet (20 mg total) by mouth daily. 30 tablet 0 04/06/2022 at 2030   rOPINIRole (REQUIP) 1 MG tablet Take 1 mg by mouth at bedtime.   04/06/2022 at pm   rosuvastatin (CRESTOR) 10 MG tablet Take 10 mg by mouth daily.   04/06/2022 at pm   spironolactone (ALDACTONE) 25 MG tablet Take 25 mg by mouth daily.   04/07/2022 at am   tamsulosin (FLOMAX) 0.4 MG CAPS capsule TAKE 1 CAPSULE BY MOUTH ONCE DAILY AFTERSUPPER (Patient taking differently: Take 0.4 mg by mouth daily after supper.) 30 capsule 11 04/06/2022 at pm    Scheduled:   amiodarone  200 mg Oral Daily   Chlorhexidine Gluconate Cloth  6 each Topical Daily   digoxin  0.125 mg Oral Daily   mouth rinse  15 mL Mouth Rinse 4 times per day   pantoprazole  40 mg Oral Daily   rOPINIRole  1 mg Oral QHS   rosuvastatin  10 mg Oral Daily   sacubitril-valsartan  1 tablet Oral BID   spironolactone  25 mg Oral Daily   tamsulosin  0.4 mg Oral QPC supper   umeclidinium-vilanterol  1 puff Inhalation Daily   Infusions:   heparin 1,200 Units/hr (04/11/22 1300)    Assessment: 56 yom presenting with near syncope and SOB. Heparin per pharmacy consult placed for pulmonary embolus.  Heparin level 0.63 units/mL (therapeutic). No signs of  bleeding.  Goal of Therapy:  Heparin level 0.3-0.7 units/ml Monitor platelets by anticoagulation protocol: Yes   Plan: Continue heparin 1200 units/hr Check confirmatory heparin level at 2200 Continue to monitor H&H and platelets F/u switch to [place], PharmD Clinical Pharmacist 04/11/2022 4:07 PM

## 2022-04-11 NOTE — Telephone Encounter (Signed)
Patient needs a CT without contrast to follow pulmonary nodules in 3 months, preferably done at [facility].  He also needs a new consult visit with to Dgayli at [facility] in 3 months to review that CT scan. Thank you

## 2022-04-11 NOTE — Progress Notes (Signed)
  Transition of Care [facility]) Screening Note   Patient Details  Name: Bruce [name] Date of Birth: Feb 01, 1946   Transition of Care [facility]) CM/SW Contact:    Milas Gain, [place] Phone Number: 04/11/2022, 2:08 PM    Transition of Care Department [facility]) has reviewed patient and no TOC needs have been identified at this time. We will continue to monitor patient advancement through interdisciplinary progression rounds. If new patient transition needs arise, please place a TOC consult.

## 2022-04-11 NOTE — Progress Notes (Signed)
ANTICOAGULATION CONSULT NOTE - Consult  Pharmacy Consult for Heparin Indication: pulmonary embolus  Allergies  Allergen Reactions   Adhesive [Tape] Other (See Comments)    After right leg fracture surgery, pt developed a large blister where tape was applied to his right leg. OK to use paper tape.   Imdur [Isosorbide Dinitrate] Other (See Comments)    hallucinations   Singulair [Montelukast Sodium] Other (See Comments)    Hallucinations     Patient Measurements: Height: '6\' 1"'$  (185.4 cm) Weight: 74 kg (163 lb 2.3 oz) IBW/kg (Calculated) : 79.9 Heparin Dosing Weight: 84.4 kg  Vital Signs: Temp: 98 F (36.7 C) (02/06 1954) Temp Source: Oral (02/06 1954) BP: 115/75 (02/06 1954) Pulse Rate: 84 (02/06 1954)  Labs: Recent Labs    04/09/22 1645 04/09/22 1805 04/09/22 1838 04/09/22 1935 04/10/22 0358 04/10/22 0901 04/10/22 1344 04/11/22 0622 04/11/22 1514 04/11/22 2159  HGB 13.8 11.2*  --   --  13.3  --   --  12.0*  --   --   HCT 41.8 33.0*  --   --  39.4  --   --  35.3*  --   --   PLT 162  --   --   --  154  --   --  167  --   --   APTT  --   --   --    < > 139* 100*  --  138*  --   --   LABPROT 17.0*  --   --   --   --   --   --   --   --   --   INR 1.4*  --   --   --   --   --   --   --   --   --   HEPARINUNFRC  --   --   --    < > 1.01* 0.92*   < > 1.00* 0.63 0.59  CREATININE 1.07  --   --   --  0.97 0.98  --   --   --   --   TROPONINIHS 231*  --  232*  --   --  187*  --   --   --   --    < > = values in this interval not displayed.     Estimated Creatinine Clearance: 69.2 mL/min (by C-G formula based on SCr of 0.98 mg/dL).   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   UTI (urinary tract infection)    frequent UTI    Medications:  Medications Prior to Admission  Medication Sig Dispense Refill Last Dose   albuterol (PROVENTIL HFA;VENTOLIN HFA) 108 (90 Base) MCG/ACT inhaler Inhale 2 puffs into the lungs every 6 (six) hours as needed for wheezing or shortness of breath. 1 Inhaler 2 04/09/2022 at 1300   ALPRAZolam (XANAX) 0.25 MG tablet Take 0.25 mg by mouth daily as needed for anxiety.   UNKNOWN   amiodarone (PACERONE) 200 MG tablet Take 200-400 mg by mouth See admin instructions. Take 2 tablets by mouth twice a daily for 1 week, then 3 tablets for 1 week, then 1 tablet twice daily for 1 week until next appointment   04/07/2022   carvedilol (COREG) 12.5 MG tablet Take 1 tablet (12.5 mg total) by mouth 2 (two) times daily. 60 tablet 0 04/07/2022 at 0845   cetirizine (ZYRTEC) 10 MG tablet Take 10 mg by mouth daily.   04/07/2022   digoxin (LANOXIN) 0.125 MG tablet Take 0.125 mg by mouth daily.   04/07/2022 at 0845   ENTRESTO 49-51 MG Take 1 tablet by mouth 2 (two) times daily.   04/07/2022 at [place] 10 MG TABS tablet Take 10 mg by mouth daily.   04/07/2022 at am   fluticasone (FLONASE) 50 MCG/ACT nasal spray Place 1 spray into both nostrils daily.   04/09/2022 at am   furosemide (LASIX) 20 MG tablet Take 20 mg by mouth daily.   04/07/2022 at am   Multiple Vitamin (MULTIVITAMIN) tablet Take 1 tablet by mouth daily. For Men   04/07/2022 at am   nitroGLYCERIN (NITROSTAT) 0.4 MG SL tablet Place 0.4 mg under the tongue every 5 (five) minutes as needed for chest pain.     UNKNOWN   Omega-3 Fatty Acids (FISH OIL PO) Take 1,000 mg by mouth every evening.    04/06/2022 at pm   omeprazole (PRILOSEC) 40 MG capsule Take 40 mg by mouth daily.   04/07/2022 at am   ranolazine (RANEXA) 500 MG 12 hr tablet Take 500 mg by mouth 2 (two) times daily.   04/07/2022 at am   rivaroxaban (XARELTO) 20 MG TABS tablet Take 1 tablet (20 mg total) by mouth daily. 30 tablet 0 04/06/2022 at 2030   rOPINIRole (REQUIP) 1 MG tablet Take 1 mg by mouth at bedtime.   04/06/2022 at pm   rosuvastatin (CRESTOR) 10 MG tablet Take 10 mg by mouth daily.   04/06/2022 at pm   spironolactone (ALDACTONE) 25 MG tablet Take 25 mg by mouth daily.   04/07/2022 at am   tamsulosin (FLOMAX) 0.4 MG CAPS capsule TAKE 1 CAPSULE BY MOUTH ONCE DAILY AFTERSUPPER (Patient taking differently: Take 0.4 mg by mouth daily after supper.) 30 capsule 11 04/06/2022 at pm    Scheduled:   amiodarone  200 mg Oral Daily   Chlorhexidine Gluconate Cloth  6 each Topical Daily   digoxin  0.125 mg Oral Daily   mouth rinse  15 mL Mouth Rinse 4 times per day   pantoprazole  40 mg Oral Daily   rOPINIRole  1 mg Oral QHS   rosuvastatin  10 mg Oral Daily   sacubitril-valsartan  1 tablet Oral BID   spironolactone  25 mg Oral Daily   tamsulosin  0.4 mg Oral QPC supper   umeclidinium-vilanterol  1 puff Inhalation Daily   Infusions:   heparin 1,200 Units/hr (04/11/22 1900)    Assessment: 83 yom presenting with near syncope and SOB. Heparin per pharmacy consult placed for pulmonary embolus.  Heparin level 0.59 units/mL (therapeutic). No signs of  bleeding.  Goal of Therapy:  Heparin level 0.3-0.7 units/ml Monitor platelets by anticoagulation protocol: Yes   Plan: Continue heparin 1200 units/hr Daily heparin level and CBC Continue to monitor H&H and platelets F/u switch to [place], PharmD Clinical Pharmacist 04/11/2022 10:41 PM

## 2022-04-12 ENCOUNTER — Inpatient Hospital Stay (HOSPITAL_COMMUNITY): Payer: 59

## 2022-04-12 DIAGNOSIS — J9601 Acute respiratory failure with hypoxia: Secondary | ICD-10-CM | POA: Diagnosis not present

## 2022-04-12 DIAGNOSIS — I2609 Other pulmonary embolism with acute cor pulmonale: Secondary | ICD-10-CM | POA: Diagnosis not present

## 2022-04-12 DIAGNOSIS — I2602 Saddle embolus of pulmonary artery with acute cor pulmonale: Secondary | ICD-10-CM | POA: Diagnosis not present

## 2022-04-12 LAB — BASIC METABOLIC PANEL
Anion gap: 9 (ref 5–15)
BUN: 18 mg/dL (ref 8–23)
CO2: 21 mmol/L — ABNORMAL LOW (ref 22–32)
Calcium: 8.5 mg/dL — ABNORMAL LOW (ref 8.9–10.3)
Chloride: 108 mmol/L (ref 98–111)
Creatinine, Ser: 0.96 mg/dL (ref 0.61–1.24)
GFR, Estimated: >60 mL/min (ref >60)
Glucose, Bld: 95 mg/dL (ref 70–99)
Potassium: 3.8 mmol/L (ref 3.5–5.1)
Sodium: 138 mmol/L (ref 135–145)

## 2022-04-12 LAB — CBC
HCT: 35.7 % — ABNORMAL LOW (ref 39.0–52.0)
Hemoglobin: 11.7 g/dL — ABNORMAL LOW (ref 13.0–17.0)
MCH: 30.9 pg (ref 26.0–34.0)
MCHC: 32.8 g/dL (ref 30.0–36.0)
MCV: 94.2 fL (ref 80.0–100.0)
Platelets: 168 10*3/uL (ref 150–400)
RBC: 3.79 MIL/uL — ABNORMAL LOW (ref 4.22–5.81)
RDW: 13.9 % (ref 11.5–15.5)
WBC: 10.6 10*3/uL — ABNORMAL HIGH (ref 4.0–10.5)
nRBC: 0 % (ref 0.0–0.2)

## 2022-04-12 LAB — HEPARIN LEVEL (UNFRACTIONATED): Heparin Unfractionated: 0.56 IU/mL (ref 0.30–0.70)

## 2022-04-12 MED ORDER — SODIUM CHLORIDE 0.9% FLUSH
3.0000 mL | Freq: Two times a day (BID) | INTRAVENOUS | Status: DC
  Administered 2022-04-12 – 2022-04-18 (×6): 3 mL via INTRAVENOUS

## 2022-04-12 NOTE — NC FL2 (Signed)
[place] LEVEL OF CARE FORM     IDENTIFICATION  Patient Name: Bruce [name] Birthdate: 02-Aug-1945 Sex: male Admission Date (Current Location): 04/09/2022  [facility] and [place] Number:  Herbalist and Address:  [facility] [place], [place] [address] [place], [place] [address]      Provider Number: [id number]  Attending Physician Name and Address:  Aline August, MD  Relative Name and Phone Number:       Current Level of Care: Hospital Recommended Level of Care: [place] Prior Approval Number:    Date Approved/Denied:   PASRR Number: [id number]  Discharge Plan: SNF    Current Diagnoses: Patient Active Problem List   Diagnosis Date Noted   Pulmonary embolism ([place]) 04/09/2022   Acute respiratory failure with hypoxia ([place]) 04/09/2022   Lactic acidosis 04/09/2022   Hypokalemia 04/09/2022   COPD with acute exacerbation ([place]) 05/03/2020   PAF (paroxysmal atrial fibrillation) ([place])    Syncope 12/31/2018   Intracranial bleed ([place]) 12/31/2018   AKI (acute kidney injury) ([place])    Frontal lobe contusion (HCC)    Multiple closed facial bone fractures (HCC)    NSTEMI (non-ST elevated myocardial infarction) ([place]) 10/10/2018   Recurrent syncope 04/28/2018   Malignant neoplasm of prostate ([place]) 07/13/2016   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   Visual hallucination 03/18/2015   Auditory hallucination 03/18/2015   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 01/01/2015   Tibial plateau fracture 12/29/2014   Limb ischemia 11/18/2013   Blunt trauma of lower leg 10/08/2013   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[place]) 10/08/2013   Acute blood loss anemia 10/08/2013   Anxiety disorder 10/08/2013   Dyslipidemia 10/08/2013   GERD (gastroesophageal reflux disease) 10/08/2013   History of MI (myocardial infarction) 10/08/2013   Anticoagulated 10/08/2013   Hypertension    Tibia/fibula fracture 10/06/2013   CAD  (coronary artery disease) 08/16/2010   Edema 08/16/2010   COPD (chronic obstructive pulmonary disease) ([place]) 08/16/2010   DVT of leg (deep venous thrombosis) (HCC) 08/16/2010   Leg cramps 08/16/2010    Orientation RESPIRATION BLADDER Height & Weight     Self, Time, Situation, Place  O2 (3 liters) Continent Weight: 159 lb 6.3 oz (72.3 kg) Height:  '6\' 1"'$  (185.4 cm)  BEHAVIORAL SYMPTOMS/MOOD NEUROLOGICAL BOWEL NUTRITION STATUS      Continent Diet (See dc summary)  AMBULATORY STATUS COMMUNICATION OF NEEDS Skin   Limited Assist Verbally Normal                       Personal Care Assistance Level of Assistance  Bathing, Feeding, Dressing Bathing Assistance: Maximum assistance Feeding assistance: Limited assistance Dressing Assistance: Maximum assistance     Functional Limitations Info  Sight, Hearing, Speech Sight Info: Adequate Hearing Info: Impaired Speech Info: Adequate    SPECIAL CARE FACTORS FREQUENCY  PT (By licensed PT), OT (By licensed OT)     PT Frequency: 5xweek OT Frequency: 5xweek            Contractures Contractures Info: Not present    Additional Factors Info  Code Status, Allergies Code Status Info: Full Allergies Info: Adhesive (Tape)  Imdur (Isosorbide Dinitrate)  Singulair (Montelukast Sodium)           Current Medications (04/12/2022):  This is the current hospital active medication list Current Facility-Administered Medications  Medication Dose Route Frequency Provider Last Rate Last Admin   acetaminophen (TYLENOL) tablet 650 mg  650 mg  Oral Q6H PRN Julian Hy, DO   650 mg at 04/10/22 1817   albuterol (PROVENTIL) (2.5 MG/3ML) 0.083% nebulizer solution 2.5 mg  2.5 mg Nebulization Q2H PRN Bowser, [place] Dimmer, NP       ALPRAZolam Duanne Moron) tablet 0.25 mg  0.25 mg Oral QHS PRN Noemi Chapel P, DO   0.25 mg at 04/10/22 1817   amiodarone (PACERONE) tablet 200 mg  200 mg Oral Daily Julian Hy, DO   200 mg at 04/12/22 4627   digoxin (LANOXIN)  tablet 0.125 mg  0.125 mg Oral Daily Julian Hy, DO   0.125 mg at 04/12/22 0350   docusate sodium (COLACE) capsule 100 mg  100 mg Oral BID PRN Cristal Generous, NP       heparin ADULT infusion 100 units/mL (25000 units/262m)  1,200 Units/hr Intravenous Continuous Pham, Minh Q, RPH-CPP 12 mL/hr at 04/12/22 1059 1,200 Units/hr at 04/12/22 1059   Oral care mouth rinse  15 mL Mouth Rinse PRN MAudria Nine DO       pantoprazole (PROTONIX) EC tablet 40 mg  40 mg Oral Daily CNoemi ChapelP, DO   40 mg at 04/12/22 0819   polyethylene glycol (MIRALAX / GLYCOLAX) packet 17 g  17 g Oral Daily PRN BCristal Generous NP       rOPINIRole (REQUIP) tablet 1 mg  1 mg Oral QHS CNoemi ChapelP, DO   1 mg at 04/11/22 2147   rosuvastatin (CRESTOR) tablet 10 mg  10 mg Oral Daily BCristal Generous NP   10 mg at 04/12/22 00938  sacubitril-valsartan (ENTRESTO) 24-26 mg per tablet  1 tablet Oral BID CNoemi ChapelP, DO   1 tablet at 04/12/22 0819   sodium chloride flush (NS) 0.9 % injection 3 mL  3 mL Intravenous Q12H Clegg, Amy D, NP       spironolactone (ALDACTONE) tablet 25 mg  25 mg Oral Daily CNoemi ChapelP, DO   25 mg at 04/12/22 0819   tamsulosin (FLOMAX) capsule 0.4 mg  0.4 mg Oral QPC supper CNoemi ChapelP, DO   0.4 mg at 04/11/22 1815   umeclidinium-vilanterol (ANORO ELLIPTA) 62.5-25 MCG/ACT 1 puff  1 puff Inhalation Daily CNoemi ChapelP, DO   1 puff at 04/12/22 01829    Discharge Medications: Please see discharge summary for a list of discharge medications.  Relevant Imaging Results:  Relevant Lab Results:   Additional Information SSN: [id number] JBeckey Rutter MSW, LCSWA, LCASA Transitions of Care  Clinical Social Worker I

## 2022-04-12 NOTE — Evaluation (Signed)
Occupational Therapy Evaluation Patient Details Name: Bruce [name] MRN: [id number] DOB: June 27, 1945 Today's Date: 04/12/2022   History of Present Illness Pt is a 77 y.o. male who presented 04/09/22 with SOB and chest pain. Pt found to have bil PE with right heart strain and bil lower extremity DVTs. S/p PA angiogram and thrombectomy 2/5. PMH: cancer, CHF, COPD, coronary artery disease, GERD, HTN, MI, PVD   Clinical Impression   Pt reports independence at baseline with ADLs and functional mobility, lives with friends that cannot assist at d/c. Pt currently limited by L hip pain, unable to progress OOB at time of evaluation, pt needing min guard-mod A for ADLs, and min guard for bed mobility, supervision for lateral scoot transfer at EOB. Pt presenting with impairments listed below, will follow acutely. Recommend SNF at d/c pending progression.     Recommendations for follow up therapy are one component of a multi-disciplinary discharge planning process, led by the attending physician.  Recommendations may be updated based on patient status, additional functional criteria and insurance authorization.   Follow Up Recommendations  Skilled nursing-short term rehab (<3 hours/day)     Assistance Recommended at Discharge Frequent or constant Supervision/Assistance  Patient can return home with the following A lot of help with walking and/or transfers;A lot of help with bathing/dressing/bathroom;Assistance with cooking/housework;Direct supervision/assist for medications management;Direct supervision/assist for financial management;Assist for transportation;Help with stairs or ramp for entrance    Functional Status Assessment  Patient has had a recent decline in their functional status and demonstrates the ability to make significant improvements in function in a reasonable and predictable amount of time.  Equipment Recommendations  Other (comment) (defer)    Recommendations for Other Services PT  consult     Precautions / Restrictions Precautions Precautions: Fall;Other (comment) Precaution Comments: watch SpO2 Restrictions Weight Bearing Restrictions: No      Mobility Bed Mobility Overal bed mobility: Needs Assistance Bed Mobility: Supine to Sit, Sit to Supine     Supine to sit: Min guard Sit to supine: Min guard        Transfers Overall transfer level: Needs assistance   Transfers: Bed to chair/wheelchair/BSC            Lateral/Scoot Transfers: Supervision General transfer comment: pt declining standing attempts at this time due to L hip pain, , able to lateral scoot toward [facility]      Balance Overall balance assessment: Needs assistance Sitting-balance support: No upper extremity supported, Feet supported Sitting balance-Leahy Scale: Good Sitting balance - Comments: can reach outside BOS to pull up socks                                   ADL either performed or assessed with clinical judgement   ADL Overall ADL's : Needs assistance/impaired Eating/Feeding: Set up   Grooming: Min guard;Sitting   Upper Body Bathing: Minimal assistance   Lower Body Bathing: Moderate assistance   Upper Body Dressing : Minimal assistance   Lower Body Dressing: Moderate assistance   Toilet Transfer: Minimal assistance;Moderate assistance;Rolling walker (2 wheels)   Toileting- Clothing Manipulation and Hygiene: Moderate assistance       Functional mobility during ADLs: Minimal assistance;Moderate assistance       Vision   Vision Assessment?: No apparent visual deficits     Perception Perception Perception Tested?: No   Praxis Praxis Praxis tested?: Not tested    Pertinent Vitals/Pain Pain Assessment Pain Assessment:  Faces Pain Score: 7  Faces Pain Scale: Hurts even more Pain Location: L hip Pain Descriptors / Indicators: Discomfort, Grimacing, Guarding Pain Intervention(s): Limited activity within patient's tolerance, Monitored  during session, Repositioned     Hand Dominance Right   Extremity/Trunk Assessment Upper Extremity Assessment Upper Extremity Assessment: Generalized weakness   Lower Extremity Assessment Lower Extremity Assessment: Defer to PT evaluation (L hip pain)   Cervical / Trunk Assessment Cervical / Trunk Assessment: Normal   Communication Communication Communication: No difficulties   Cognition Arousal/Alertness: Awake/alert Behavior During Therapy: WFL for tasks assessed/performed Overall Cognitive Status: Within Functional Limits for tasks assessed                                 General Comments: verbose at times     General Comments  SpO2 in low 80's on 4L seated EOB, increased to high 80's /low 90's, when increased to 6L    Exercises     Shoulder Instructions      Home Living Family/patient expects to be discharged to:: Private residence Living Arrangements: Non-relatives/Friends Available Help at Discharge: Friend(s) (reports friends cannot help) Type of Home: Mobile home Home Access: Stairs to enter Technical brewer of Steps: 5 Entrance Stairs-Rails: None Home Layout: One level     Bathroom Shower/Tub: Teacher, early years/pre: Standard     Home Equipment: Cane - single point          Prior Functioning/Environment Prior Level of Function : Independent/Modified Independent;Driving;History of Falls (last six months)             Mobility Comments: x1-2 falls/month on average. Was holding onto furniture for support prior to his fall a couple weeks ago in which he landed on his L hip and have had pain and difficulty weight bearing since, thus resulting in him using his [facility] recently ADLs Comments: ind with ADLs and IADLs        OT Problem List: Decreased strength;Decreased range of motion;Decreased activity tolerance;Impaired balance (sitting and/or standing);Decreased safety awareness;Decreased cognition;Cardiopulmonary  status limiting activity      OT Treatment/Interventions: Self-care/ADL training;Therapeutic exercise;Energy conservation;DME and/or AE instruction;Therapeutic activities;Patient/family education;Balance training    OT Goals(Current goals can be found in the care plan section) Acute Rehab OT Goals Patient Stated Goal: none stated OT Goal Formulation: With patient Time For Goal Achievement: 04/26/22 Potential to Achieve Goals: Good ADL Goals Pt Will Perform Upper Body Dressing: with supervision;sitting Pt Will Perform Lower Body Dressing: with min assist;sitting/lateral leans;sit to/from stand Pt Will Transfer to Toilet: with min guard assist;ambulating;regular height toilet Pt Will Perform Tub/Shower Transfer: Tub transfer;Shower transfer;with min assist;ambulating  OT Frequency: Min 2X/week    Co-evaluation              AM-PAC OT "6 Clicks" Daily Activity     Outcome Measure Help from another person eating meals?: None Help from another person taking care of personal grooming?: A Little Help from another person toileting, which includes using toliet, bedpan, or urinal?: A Lot Help from another person bathing (including washing, rinsing, drying)?: A Lot Help from another person to put on and taking off regular upper body clothing?: A Little Help from another person to put on and taking off regular lower body clothing?: A Lot 6 Click Score: 16   End of Session Nurse Communication: Mobility status;Other (comment) (SpO2 drop)  Activity Tolerance: Patient limited by pain Patient left: in bed;with  call bell/phone within reach;with bed alarm set  OT Visit Diagnosis: Unsteadiness on feet (R26.81);Other abnormalities of gait and mobility (R26.89);Muscle weakness (generalized) (M62.81);History of falling (Z91.81)                Time: 1435-1501 OT Time Calculation (min): 26 min Charges:  OT General Charges $OT Visit: 1 Visit OT Evaluation $OT Eval Moderate Complexity: 1 Mod OT  Treatments $Self Care/Home Management : 8-22 mins  Renaye Rakers, OTD, OTR/L SecureChat Preferred Acute Rehab [phone]  Renaye Rakers [place] 04/12/2022, 3:13 PM

## 2022-04-12 NOTE — Progress Notes (Signed)
Heart and Vascular Care Navigation  04/12/2022  Bruce [name] June 02, 1945 [id number]  Reason for Referral: Paramedicine enrollment   Engaged with patient face to face for initial visit for Heart and Vascular Care Coordination.                                                                                                   Assessment:     Outpatient HF CSW consulted to discuss enrollment in [employer].  CSW discussed program and goals of enrollment and pt is agreeable to services at this time.  Paramedicine Initial Assessment:  Housing:  In what kind of housing do you live? House/apt/trailer/shelter? Mobile home  Do you rent/pay a mortgage/own? Rent ($200/month)  Do you live with anyone? Lives with a friend and his friends brother as well as multiple animals.  Are you currently worried about losing your housing?  Not necessarily about losing it but he does not want to return there- states the man that lives there is unstable and that the environment in chaotic.  Pt has been couch surfing for over a year though and states he has no one else to take him in at this time.  Social:  Do you have any children?  Reports he has one son but he does not have a relationship with him at this time- states that he is responsible from him being evicted from his home over a year ago and was not remorseful at all- they are not currently talking.  Do you have family or friends who live locally?  Reports friends- his emergency contacts and the woman he lives with named [name].  Food:  Able to get food utilizing his U card through [employer]  Income:  What is your current source of income? Retirement income- $1,200/month- gets deductible for insurance which he didn't used to- attempted to help him call [employer] to clarify what happened but they just state they got a letter from the state stating he no longer had the benefit- CSW will help him contact [employer] to get more  information.  Insurance:  Are you currently insured? UHC dual complete  Transportation:  Do you have transportation to your medical appointments? Has his own car- does state gas can be cost prohibitive at times.   Daily Health Needs: Do you have a working scale at home? No will need one provided  How do you manage your medications at home? Sounds as if he gets blister packs.  Do you ever take your medications differently than prescribed? no  Do you have issues affording your medications? no  Do you have any concerns with mobility at home? Utilizes a cane  Do you have a PCP?Dr. Charlynn Grimes  Do you have any trouble reading or writing? Reports he is unable to read/write but manages well with his medications- states that he asks lots of questions to the doctors and pharmacist to make sure he is taking the medications correctly.  Are there any additional barriers you see to getting the care you need? Not at this time- main hope is to get  into housing- has his name on waitlist for a [place] income based complex but has been on that for a year now. Thinks he could pay $500/month in rent- CSW will attempt to help find other housing options for him.                            HRT/VAS Care Coordination     Outpatient Care Team Community Paramedicine; Social Worker   Social Worker Name: [name], [facility], [phone]   Living arrangements for the past 2 months Mobile Home   Lives with: Pets; Friends   Patient Current Insurance Coverage Managed Medicare; Medicaid   Patient Has Concern With Paying Medical Bills No   Does Patient Have Prescription Coverage? Yes   Home Assistive Devices/Equipment Cane (specify quad or straight)   [place]   Current home services DME  cane       Social History:                                                                             SDOH Screenings   Food Insecurity: No Food Insecurity (04/12/2022)  Housing: High  Risk (04/12/2022)  Transportation Needs: No Transportation Needs (04/12/2022)  Financial Resource Strain: High Risk (04/12/2022)  Physical Activity: Inactive (02/05/2017)  Social Connections: Moderately Isolated (02/05/2017)  Stress: Stress Concern Present (02/05/2017)  Tobacco Use: High Risk (04/10/2022)    SDOH Interventions: Financial Resources:  Financial Strain Interventions: Other (Comment) (will help reapply for medicare savings program) Gets about $1,200 a month  Food Insecurity:  Food Insecurity Interventions: Intervention Not Indicated  Housing Insecurity:  Housing Interventions: Other (Comment) (assisting in finding income based housing options)  Transportation:   Transportation Interventions: Intervention Not Indicated    Follow-up plan:    CSW sent out referral to paramedics for review and assignment- will continue to follow and assist as needed  [name], [facility] Desk#: [phone] Cell#: [phone]

## 2022-04-12 NOTE — Progress Notes (Signed)
PROGRESS NOTE    Bruce [name]  ENI:[id number] DOB: December 12, 1945 DOA: 04/09/2022 PCP: Danelle Berry, NP   Brief Narrative:  77 year old male with history of paroxysmal A-fib, CAD/MI, chronic systolic heart failure, COPD, hypertension, prostate cancer, colon cancer, DVT 5 years ago, tobacco abuse presented with chest pain and dizziness.  Workup in the ED revealed submassive PE with right heart strain.  He was admitted to ICU under PCCM service and started on heparin drip.  He underwent IR thrombectomy on 04/10/2022.  Echo showed EF of 20 to 25% with global hypokinesis.  Heart failure team was consulted.  He was transferred to [facility] service from 04/12/2022 onwards.  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  Started on heparin drip and admitted to ICU under PCCM service.  Underwent IR thrombectomy of right pulmonary artery on 04/10/2022. -Transferred to [facility] service from 04/12/2022 onwards -Will possibly need lifelong anticoagulation: Currently on heparin drip.  Heparin noncompliant with Xarelto as an outpatient.  Will switch to possibly oral Eliquis once okay with cardiology/heart failure team.  Patient refuses Coumadin -Currently on 2 L oxygen via nasal cannula.  Wean off as able  Acute on chronic systolic heart failure -Echo shows EF of 20 to 25%.  Previous EF of 40 to 45%. -Currently compensated.  Strict input and output.  Daily weights.  Fluid restriction --heart failure team following: Currently Entresto, spironolactone and digoxin.  Will possibly need left heart cath prior to starting oral anticoagulation  Paroxysmal A-fib -Currently mostly rate controlled.  Continue digoxin, amiodarone.  Currently on heparin drip  COPD -Currently stable.   Continue current inhaled regimen.  Left upper lobe lung nodule -Enlarging left upper lobe lung nodule.  Needs follow-up CT in 3 months and outpatient pulmonary follow-up  BPH -Continue tamsulosin  Restless leg syndrome -Continue Requip  Left hip pain Ambulatory dysfunction -Apparently fell few weeks ago on the left hip and has been having left hip pain.  Will check left hip x-ray.  PT eval.  GERD Continue Protonix  History of prostate cancer -Outpatient follow-up with urology  Anxiety -Continue as needed Xanax  DVT prophylaxis: Heparin drip Code Status: Full Family Communication: None at bedside Disposition Plan: Status is: Inpatient Remains inpatient appropriate because: Of severity of illness  Consultants: PCCM/IR/cardiology  Procedures: As above  Antimicrobials: None   Subjective: Patient seen and examined at bedside.  Feels slightly short of breath with exertion.  Complains of left hip pain.  No fever, vomiting, chest pain reported.  Objective: Vitals:   04/12/22 0327 04/12/22 0330 04/12/22 0500 04/12/22 0700  BP: 115/72   114/75  Pulse: 86 81  97  Resp: (!) '26 20  19  '$ Temp: 98.4 F (36.9 C)   98.5 F (36.9 C)  TempSrc: Oral   Oral  SpO2: 93% 93%  (!) 87%  Weight:   72.3 kg   Height:        Intake/Output Summary (Last 24 hours) at 04/12/2022 1047 Last data filed at 04/12/2022 0729 Gross per 24 hour  Intake 1092.09 ml  Output 725 ml  Net 367.09 ml   Filed Weights   04/11/22 0500 04/11/22 2100 04/12/22 0500  Weight: 74 kg 72.3 kg 72.3 kg    Examination:  General exam: Appears calm and comfortable.  Looks chronically ill and deconditioned.  Currently on 2 L oxygen by nasal cannula Respiratory system: Bilateral decreased breath sounds at bases with scattered crackles Cardiovascular system: S1 & S2 heard, Rate controlled Gastrointestinal system: Abdomen is nondistended, soft and nontender. Normal bowel sounds heard. Extremities: No cyanosis,  clubbing; trace lower extremity edema Central nervous system: Alert and oriented.  Slow to respond.  Poor historian.  No focal neurological deficits. Moving extremities Skin: No rashes, lesions or ulcers Psychiatry: Flat affect.  Not agitated.   Data Reviewed: I have personally reviewed following labs and imaging studies  CBC: Recent Labs  Lab 04/09/22 1645 04/09/22 1805 04/10/22 0358 04/11/22 0622 04/12/22 0021  WBC 8.8  --  7.4 11.1* 10.6*  NEUTROABS 7.4  --   --   --   --   HGB 13.8 11.2* 13.3 12.0* 11.7*  HCT 41.8 33.0* 39.4 35.3* 35.7*  MCV 93.9  --  91.8 92.9 94.2  PLT 162  --  154 167 932   Basic Metabolic Panel: Recent Labs  Lab 04/09/22 1645 04/09/22 1805 04/10/22 0358 04/10/22 0901 04/12/22 0021  NA 142 145 140 135 138  K 3.5 2.8* 4.8 4.4 3.8  CL 112*  --  110 108 108  CO2 18*  --  19* 16* 21*  GLUCOSE 133*  --  206* 164* 95  BUN 12  --  '15 17 18  '$ CREATININE 1.07  --  0.97 0.98 0.96  CALCIUM 8.8*  --  9.6 9.4 8.5*  MG 1.8  --   --   --   --    GFR: Estimated Creatinine Clearance: 69 mL/min (by C-G formula based on SCr of 0.96 mg/dL). Liver Function Tests: Recent Labs  Lab 04/09/22 1645  AST 31  ALT 23  ALKPHOS 83  BILITOT 1.7*  PROT 6.4*  ALBUMIN 3.4*   No results for input(s): "LIPASE", "AMYLASE" in the last 168 hours. No results for input(s): "AMMONIA" in the last 168 hours. Coagulation Profile: Recent Labs  Lab 04/09/22 1645  INR 1.4*   Cardiac Enzymes: No results for input(s): "CKTOTAL", "CKMB", "CKMBINDEX", "TROPONINI" in the last 168 hours. BNP (last 3 results) No results for input(s): "PROBNP" in the last 8760 hours. HbA1C: No results for input(s): "HGBA1C" in the last 72 hours. CBG: No results for input(s): "GLUCAP" in the last 168 hours. Lipid Profile: No results for input(s): "CHOL", "HDL", "LDLCALC", "TRIG", "CHOLHDL", "LDLDIRECT" in the last 72 hours. Thyroid Function Tests: No results for input(s): "TSH", "T4TOTAL",  "FREET4", "T3FREE", "THYROIDAB" in the last 72 hours. Anemia Panel: No results for input(s): "VITAMINB12", "FOLATE", "FERRITIN", "TIBC", "IRON", "RETICCTPCT" in the last 72 hours. Sepsis Labs: Recent Labs  Lab 04/09/22 2200 04/10/22 0032 04/10/22 0901  LATICACIDVEN 5.4* 3.5* 2.4*    Recent Results (from the past 240 hour(s))  Resp panel by RT-PCR (RSV, Flu A&B, Covid) Anterior Nasal Swab     Status: None   Collection Time: 04/09/22  4:57 PM   Specimen: Anterior Nasal Swab  Result Value Ref Range Status   SARS Coronavirus 2 by RT PCR NEGATIVE NEGATIVE Final   Influenza A by PCR NEGATIVE NEGATIVE Final   Influenza B by PCR NEGATIVE NEGATIVE Final    Comment: (NOTE) The Xpert Xpress SARS-CoV-2/FLU/RSV plus assay is intended as an aid in the diagnosis of influenza from Nasopharyngeal swab specimens and should not be used as a sole basis for treatment. Nasal washings and aspirates are unacceptable for Xpert Xpress SARS-CoV-2/FLU/RSV testing.  Fact Sheet  for Patients: EntrepreneurPulse.com.au  Fact Sheet for Healthcare Providers: IncredibleEmployment.be  This test is not yet approved or cleared by the [place] FDA and has been authorized for detection and/or diagnosis of SARS-CoV-2 by FDA under an Emergency Use Authorization (EUA). This EUA will remain in effect (meaning this test can be used) for the duration of the COVID-19 declaration under Section 564(b)(1) of the Act, 21 U.S.C. section 360bbb-3(b)(1), unless the authorization is terminated or revoked.     Resp Syncytial Virus by PCR NEGATIVE NEGATIVE Final    Comment: (NOTE) Fact Sheet for Patients: EntrepreneurPulse.com.au  Fact Sheet for Healthcare Providers: IncredibleEmployment.be  This test is not yet approved or cleared by the [place] FDA and has been authorized for detection and/or diagnosis of SARS-CoV-2 by FDA under an  Emergency Use Authorization (EUA). This EUA will remain in effect (meaning this test can be used) for the duration of the COVID-19 declaration under Section 564(b)(1) of the Act, 21 U.S.C. section 360bbb-3(b)(1), unless the authorization is terminated or revoked.  Performed at [facility], [place] [address] [place], [place] [address]   MRSA Next Gen by PCR, Nasal     Status: None   Collection Time: 04/10/22  1:09 AM   Specimen: Nasal Mucosa; Nasal Swab  Result Value Ref Range Status   MRSA by PCR Next Gen NOT DETECTED NOT DETECTED Final    Comment: (NOTE) The GeneXpert MRSA Assay (FDA approved for NASAL specimens only), is one component of a comprehensive MRSA colonization surveillance program. It is not intended to diagnose MRSA infection nor to guide or monitor treatment for MRSA infections. Test performance is not FDA approved in patients less than 47 years old. Performed at [facility], [place] [address] [place], [place] [address]          Radiology Studies: IR THROMBECT PRIM MECH INIT [facility]) MOD SED  Result Date: 04/10/2022 CLINICAL DATA:  Pulmonary embolism, right heart strain with syncope/near syncope EXAM: 1. Ultrasound-guided puncture of the right common femoral vein 2. Catheterization and angiography of the main pulmonary artery 3. Selective catheterization and angiography of the right pulmonary artery 4. Mechanical thrombectomy of the right pulmonary artery 5. Measurement of pulmonary arterial pressures ANESTHESIA/SEDATION: Moderate (conscious) sedation was employed during this procedure. A total of Versed 1 mg and Fentanyl 25 mcg was administered intravenously. Moderate Sedation Time: 80 minutes. The patient's level of consciousness and vital signs were monitored continuously by radiology nursing throughout the procedure under my direct supervision. MEDICATIONS: Documented in the EMR CONTRAST:  58m OMNIPAQUE IOHEXOL 300 MG/ML  SOLN PROCEDURE: Following a full  explanation of the procedure including risks and benefits, written informed consent was obtained. A timeout was performed with all members of the treatment team present. Maximum sterile barrier technique was utilized. The patient was placed supine on the exam table. The right groin was prepped and draped in the standard sterile fashion. Ultrasound was used to evaluate the right common femoral vein, which found to be patent and suitable for access. An ultrasound image was permanently stored in the electronic medical record. Using ultrasound guidance, the right common femoral vein was directly punctured using a 21 gauge micropuncture set. An 018 wire was advanced centrally, followed by serial tract dilation and placement of a 8 French sheath. Using a combination of an angled pigtail catheter and tip deflecting wire, the main pulmonary artery was catheterized. Gentle hand injection of contrast material confirmed location within the main pulmonary artery. Suspected large filling  defect in the distal right pulmonary artery was identified, consistent recent CT pulmonary angiogram findings. Main pulmonary arterial pressures were then transduced, as detailed below. Using a combination of the angled pigtail catheter and Rosen wire, the right pulmonary artery was selected. The pigtail catheter was then exchanged for an angled catheter, which was used to select the right descending interlobar pulmonary artery. Wire was then exchanged for a short tapered Amplatz wire. Over this wire, the access site was serially dilated to accommodate a 24 [place] access sheath. The [place]ari 24 [place] mechanical thrombectomy catheter was then advanced into the right pulmonary artery. Mechanical thrombectomy was then performed with multiple passes. Moderate amount of chronic and subacute clot was removed. Repeat angiogram of the right artery demonstrated persistent filling defect in the right descending interlobar pulmonary artery. The suction  thrombectomy catheter was then readvanced into the clot, with additional mechanical fragmentation of the chronic elements. Suction thrombectomy was repeated, with moderate improvement in patency of the right descending interlobar pulmonary artery on repeat angiography. Final PA pressures were again transduced, as detailed below. At the end of the procedure, all wires and catheters were removed. Hemostasis was achieved at the access site using a pursestring suture. The patient tolerated the procedure well without immediate complication. COMPLICATIONS: None immediate FINDINGS: Pre PA pressure: 71/23 (mean 44) mm Hg Post PA pressure: 69/23 (mean 42) mm Hg IMPRESSION: 1. Technically successful mechanical thrombectomy of the right pulmonary artery. Patient reported subjective improvement in shortness of breath following thrombectomy, with mild improvement in vital signs including reduction in heart rate and respiratory rate. Only mild improvement in the patient's pulmonary arterial hypertension was observed, suggestive of underlying pulmonary arterial hypertension and/or chronic PE. 2. Moderate amount of chronic and subacute clot was removed. Final angiography demonstrates a significant element of chronic pulmonary embolism, particularly in the right descending interlobar pulmonary artery, which was not amenable to catheter directed therapy. Electronically Signed   By: Albin Felling M.D.   On: 04/10/2022 15:08   IR US Guide Vasc Access Right  Result Date: 04/10/2022 CLINICAL DATA:  Pulmonary embolism, right heart strain with syncope/near syncope EXAM: 1. Ultrasound-guided puncture of the right common femoral vein 2. Catheterization and angiography of the main pulmonary artery 3. Selective catheterization and angiography of the right pulmonary artery 4. Mechanical thrombectomy of the right pulmonary artery 5. Measurement of pulmonary arterial pressures ANESTHESIA/SEDATION: Moderate (conscious) sedation was employed  during this procedure. A total of Versed 1 mg and Fentanyl 25 mcg was administered intravenously. Moderate Sedation Time: 80 minutes. The patient's level of consciousness and vital signs were monitored continuously by radiology nursing throughout the procedure under my direct supervision. MEDICATIONS: Documented in the EMR CONTRAST:  44m OMNIPAQUE IOHEXOL 300 MG/ML  SOLN PROCEDURE: Following a full explanation of the procedure including risks and benefits, written informed consent was obtained. A timeout was performed with all members of the treatment team present. Maximum sterile barrier technique was utilized. The patient was placed supine on the exam table. The right groin was prepped and draped in the standard sterile fashion. Ultrasound was used to evaluate the right common femoral vein, which found to be patent and suitable for access. An ultrasound image was permanently stored in the electronic medical record. Using ultrasound guidance, the right common femoral vein was directly punctured using a 21 gauge micropuncture set. An 018 wire was advanced centrally, followed by serial tract dilation and placement of a 8 French sheath. Using a combination of an angled pigtail catheter  and tip deflecting wire, the main pulmonary artery was catheterized. Gentle hand injection of contrast material confirmed location within the main pulmonary artery. Suspected large filling defect in the distal right pulmonary artery was identified, consistent recent CT pulmonary angiogram findings. Main pulmonary arterial pressures were then transduced, as detailed below. Using a combination of the angled pigtail catheter and Rosen wire, the right pulmonary artery was selected. The pigtail catheter was then exchanged for an angled catheter, which was used to select the right descending interlobar pulmonary artery. Wire was then exchanged for a short tapered Amplatz wire. Over this wire, the access site was serially dilated to  accommodate a 24 [place] access sheath. The Inari 24 [place] mechanical thrombectomy catheter was then advanced into the right pulmonary artery. Mechanical thrombectomy was then performed with multiple passes. Moderate amount of chronic and subacute clot was removed. Repeat angiogram of the right artery demonstrated persistent filling defect in the right descending interlobar pulmonary artery. The suction thrombectomy catheter was then readvanced into the clot, with additional mechanical fragmentation of the chronic elements. Suction thrombectomy was repeated, with moderate improvement in patency of the right descending interlobar pulmonary artery on repeat angiography. Final PA pressures were again transduced, as detailed below. At the end of the procedure, all wires and catheters were removed. Hemostasis was achieved at the access site using a pursestring suture. The patient tolerated the procedure well without immediate complication. COMPLICATIONS: None immediate FINDINGS: Pre PA pressure: 71/23 (mean 44) mm Hg Post PA pressure: 69/23 (mean 42) mm Hg IMPRESSION: 1. Technically successful mechanical thrombectomy of the right pulmonary artery. Patient reported subjective improvement in shortness of breath following thrombectomy, with mild improvement in vital signs including reduction in heart rate and respiratory rate. Only mild improvement in the patient's pulmonary arterial hypertension was observed, suggestive of underlying pulmonary arterial hypertension and/or chronic PE. 2. Moderate amount of chronic and subacute clot was removed. Final angiography demonstrates a significant element of chronic pulmonary embolism, particularly in the right descending interlobar pulmonary artery, which was not amenable to catheter directed therapy. Electronically Signed   By: Albin Felling M.D.   On: 04/10/2022 15:08   IR Angiogram Selective Each Additional Vessel  Result Date: 04/10/2022 CLINICAL DATA:  Pulmonary embolism,  right heart strain with syncope/near syncope EXAM: 1. Ultrasound-guided puncture of the right common femoral vein 2. Catheterization and angiography of the main pulmonary artery 3. Selective catheterization and angiography of the right pulmonary artery 4. Mechanical thrombectomy of the right pulmonary artery 5. Measurement of pulmonary arterial pressures ANESTHESIA/SEDATION: Moderate (conscious) sedation was employed during this procedure. A total of Versed 1 mg and Fentanyl 25 mcg was administered intravenously. Moderate Sedation Time: 80 minutes. The patient's level of consciousness and vital signs were monitored continuously by radiology nursing throughout the procedure under my direct supervision. MEDICATIONS: Documented in the EMR CONTRAST:  89m OMNIPAQUE IOHEXOL 300 MG/ML  SOLN PROCEDURE: Following a full explanation of the procedure including risks and benefits, written informed consent was obtained. A timeout was performed with all members of the treatment team present. Maximum sterile barrier technique was utilized. The patient was placed supine on the exam table. The right groin was prepped and draped in the standard sterile fashion. Ultrasound was used to evaluate the right common femoral vein, which found to be patent and suitable for access. An ultrasound image was permanently stored in the electronic medical record. Using ultrasound guidance, the right common femoral vein was directly punctured using a 21 gauge micropuncture set.  An 018 wire was advanced centrally, followed by serial tract dilation and placement of a 8 French sheath. Using a combination of an angled pigtail catheter and tip deflecting wire, the main pulmonary artery was catheterized. Gentle hand injection of contrast material confirmed location within the main pulmonary artery. Suspected large filling defect in the distal right pulmonary artery was identified, consistent recent CT pulmonary angiogram findings. Main pulmonary arterial  pressures were then transduced, as detailed below. Using a combination of the angled pigtail catheter and Rosen wire, the right pulmonary artery was selected. The pigtail catheter was then exchanged for an angled catheter, which was used to select the right descending interlobar pulmonary artery. Wire was then exchanged for a short tapered Amplatz wire. Over this wire, the access site was serially dilated to accommodate a 24 [place] access sheath. The Inari 24 [place] mechanical thrombectomy catheter was then advanced into the right pulmonary artery. Mechanical thrombectomy was then performed with multiple passes. Moderate amount of chronic and subacute clot was removed. Repeat angiogram of the right artery demonstrated persistent filling defect in the right descending interlobar pulmonary artery. The suction thrombectomy catheter was then readvanced into the clot, with additional mechanical fragmentation of the chronic elements. Suction thrombectomy was repeated, with moderate improvement in patency of the right descending interlobar pulmonary artery on repeat angiography. Final PA pressures were again transduced, as detailed below. At the end of the procedure, all wires and catheters were removed. Hemostasis was achieved at the access site using a pursestring suture. The patient tolerated the procedure well without immediate complication. COMPLICATIONS: None immediate FINDINGS: Pre PA pressure: 71/23 (mean 44) mm Hg Post PA pressure: 69/23 (mean 42) mm Hg IMPRESSION: 1. Technically successful mechanical thrombectomy of the right pulmonary artery. Patient reported subjective improvement in shortness of breath following thrombectomy, with mild improvement in vital signs including reduction in heart rate and respiratory rate. Only mild improvement in the patient's pulmonary arterial hypertension was observed, suggestive of underlying pulmonary arterial hypertension and/or chronic PE. 2. Moderate amount of chronic and  subacute clot was removed. Final angiography demonstrates a significant element of chronic pulmonary embolism, particularly in the right descending interlobar pulmonary artery, which was not amenable to catheter directed therapy. Electronically Signed   By: Albin Felling M.D.   On: 04/10/2022 15:08   IR Angiogram Pulmonary Right Selective  Result Date: 04/10/2022 CLINICAL DATA:  Pulmonary embolism, right heart strain with syncope/near syncope EXAM: 1. Ultrasound-guided puncture of the right common femoral vein 2. Catheterization and angiography of the main pulmonary artery 3. Selective catheterization and angiography of the right pulmonary artery 4. Mechanical thrombectomy of the right pulmonary artery 5. Measurement of pulmonary arterial pressures ANESTHESIA/SEDATION: Moderate (conscious) sedation was employed during this procedure. A total of Versed 1 mg and Fentanyl 25 mcg was administered intravenously. Moderate Sedation Time: 80 minutes. The patient's level of consciousness and vital signs were monitored continuously by radiology nursing throughout the procedure under my direct supervision. MEDICATIONS: Documented in the EMR CONTRAST:  75m OMNIPAQUE IOHEXOL 300 MG/ML  SOLN PROCEDURE: Following a full explanation of the procedure including risks and benefits, written informed consent was obtained. A timeout was performed with all members of the treatment team present. Maximum sterile barrier technique was utilized. The patient was placed supine on the exam table. The right groin was prepped and draped in the standard sterile fashion. Ultrasound was used to evaluate the right common femoral vein, which found to be patent and suitable for access. An ultrasound image  was permanently stored in the electronic medical record. Using ultrasound guidance, the right common femoral vein was directly punctured using a 21 gauge micropuncture set. An 018 wire was advanced centrally, followed by serial tract dilation and  placement of a 8 French sheath. Using a combination of an angled pigtail catheter and tip deflecting wire, the main pulmonary artery was catheterized. Gentle hand injection of contrast material confirmed location within the main pulmonary artery. Suspected large filling defect in the distal right pulmonary artery was identified, consistent recent CT pulmonary angiogram findings. Main pulmonary arterial pressures were then transduced, as detailed below. Using a combination of the angled pigtail catheter and Rosen wire, the right pulmonary artery was selected. The pigtail catheter was then exchanged for an angled catheter, which was used to select the right descending interlobar pulmonary artery. Wire was then exchanged for a short tapered Amplatz wire. Over this wire, the access site was serially dilated to accommodate a 24 [place] access sheath. The Inari 24 [place] mechanical thrombectomy catheter was then advanced into the right pulmonary artery. Mechanical thrombectomy was then performed with multiple passes. Moderate amount of chronic and subacute clot was removed. Repeat angiogram of the right artery demonstrated persistent filling defect in the right descending interlobar pulmonary artery. The suction thrombectomy catheter was then readvanced into the clot, with additional mechanical fragmentation of the chronic elements. Suction thrombectomy was repeated, with moderate improvement in patency of the right descending interlobar pulmonary artery on repeat angiography. Final PA pressures were again transduced, as detailed below. At the end of the procedure, all wires and catheters were removed. Hemostasis was achieved at the access site using a pursestring suture. The patient tolerated the procedure well without immediate complication. COMPLICATIONS: None immediate FINDINGS: Pre PA pressure: 71/23 (mean 44) mm Hg Post PA pressure: 69/23 (mean 42) mm Hg IMPRESSION: 1. Technically successful mechanical thrombectomy  of the right pulmonary artery. Patient reported subjective improvement in shortness of breath following thrombectomy, with mild improvement in vital signs including reduction in heart rate and respiratory rate. Only mild improvement in the patient's pulmonary arterial hypertension was observed, suggestive of underlying pulmonary arterial hypertension and/or chronic PE. 2. Moderate amount of chronic and subacute clot was removed. Final angiography demonstrates a significant element of chronic pulmonary embolism, particularly in the right descending interlobar pulmonary artery, which was not amenable to catheter directed therapy. Electronically Signed   By: Albin Felling M.D.   On: 04/10/2022 15:08        Scheduled Meds:  amiodarone  200 mg Oral Daily   digoxin  0.125 mg Oral Daily   pantoprazole  40 mg Oral Daily   rOPINIRole  1 mg Oral QHS   rosuvastatin  10 mg Oral Daily   sacubitril-valsartan  1 tablet Oral BID   spironolactone  25 mg Oral Daily   tamsulosin  0.4 mg Oral QPC supper   umeclidinium-vilanterol  1 puff Inhalation Daily   Continuous Infusions:  heparin 1,200 Units/hr (04/12/22 0751)          Aline August, MD [facility] 04/12/2022, 10:47 AM

## 2022-04-12 NOTE — Progress Notes (Signed)
Flutter valve and IS delivered to pt.  Education provided.  Pt verbalized and demonstrated use with no issues. Will continue to provide education.

## 2022-04-12 NOTE — Progress Notes (Addendum)
Advanced Heart Failure Rounding Note  PCP-Cardiologist: Neoma Laming, MD   Subjective:    Remains in heparin gtt. C/w pleuritic CP. On 4L [place] O2 sats 96%.  HR improving, in 90s. BP normotensive.   Starting to feel a bit better.    Objective:   Weight Range: 72.3 kg Body mass index is 21.03 kg/m.   Vital Signs:   Temp:  [97.8 F (36.6 C)-98.6 F (37 C)] 98.5 F (36.9 C) (02/07 0700) Pulse Rate:  [67-97] 97 (02/07 0700) Resp:  [7-28] 19 (02/07 0700) BP: (95-127)/(64-84) 114/75 (02/07 0700) SpO2:  [85 %-98 %] 87 % (02/07 0700) Weight:  [72.3 kg] 72.3 kg (02/07 0500) Last BM Date : 04/10/22  Weight change: Filed Weights   04/11/22 0500 04/11/22 2100 04/12/22 0500  Weight: 74 kg 72.3 kg 72.3 kg    Intake/Output:   Intake/Output Summary (Last 24 hours) at 04/12/2022 0743 Last data filed at 04/12/2022 0729 Gross per 24 hour  Intake 1202.35 ml  Output 1125 ml  Net 77.35 ml      Physical Exam    General:  Well appearing. No resp difficulty HEENT: Normal Neck: Supple. JVP 7 cm . Carotids 2+ bilat; no bruits. No lymphadenopathy or thyromegaly appreciated. Cor: PMI nondisplaced. Regular rate & rhythm. No rubs, gallops or murmurs. Lungs: Clear Abdomen: Soft, nontender, nondistended. No hepatosplenomegaly. No bruits or masses. Good bowel sounds. Extremities: No cyanosis, clubbing, rash, edema Neuro: Alert & orientedx3, cranial nerves grossly intact. moves all 4 extremities w/o difficulty. Affect pleasant   Telemetry   NSR 90s   EKG    N/A   Labs    CBC Recent Labs    04/09/22 1645 04/09/22 1805 04/11/22 0622 04/12/22 0021  WBC 8.8   < > 11.1* 10.6*  NEUTROABS 7.4  --   --   --   HGB 13.8   < > 12.0* 11.7*  HCT 41.8   < > 35.3* 35.7*  MCV 93.9   < > 92.9 94.2  PLT 162   < > 167 168   < > = values in this interval not displayed.   Basic Metabolic Panel Recent Labs    04/09/22 1645 04/09/22 1805 04/10/22 0901 04/12/22 0021  NA 142   < > 135  138  K 3.5   < > 4.4 3.8  CL 112*   < > 108 108  CO2 18*   < > 16* 21*  GLUCOSE 133*   < > 164* 95  BUN 12   < > 17 18  CREATININE 1.07   < > 0.98 0.96  CALCIUM 8.8*   < > 9.4 8.5*  MG 1.8  --   --   --    < > = values in this interval not displayed.   Liver Function Tests Recent Labs    04/09/22 1645  AST 31  ALT 23  ALKPHOS 83  BILITOT 1.7*  PROT 6.4*  ALBUMIN 3.4*   No results for input(s): "LIPASE", "AMYLASE" in the last 72 hours. Cardiac Enzymes No results for input(s): "CKTOTAL", "CKMB", "CKMBINDEX", "TROPONINI" in the last 72 hours.  BNP: BNP (last 3 results) Recent Labs    04/09/22 1645 04/10/22 0901  BNP 537.6* 1,012.6*    ProBNP (last 3 results) No results for input(s): "PROBNP" in the last 8760 hours.   D-Dimer No results for input(s): "DDIMER" in the last 72 hours. Hemoglobin A1C No results for input(s): "HGBA1C" in the last 72 hours. Fasting  Lipid Panel No results for input(s): "CHOL", "HDL", "LDLCALC", "TRIG", "CHOLHDL", "LDLDIRECT" in the last 72 hours. Thyroid Function Tests No results for input(s): "TSH", "T4TOTAL", "T3FREE", "THYROIDAB" in the last 72 hours.  Invalid input(s): "FREET3"  Other results:   Imaging    No results found.   Medications:     Scheduled Medications:  amiodarone  200 mg Oral Daily   digoxin  0.125 mg Oral Daily   mouth rinse  15 mL Mouth Rinse 4 times per day   pantoprazole  40 mg Oral Daily   rOPINIRole  1 mg Oral QHS   rosuvastatin  10 mg Oral Daily   sacubitril-valsartan  1 tablet Oral BID   spironolactone  25 mg Oral Daily   tamsulosin  0.4 mg Oral QPC supper   umeclidinium-vilanterol  1 puff Inhalation Daily    Infusions:  heparin 1,200 Units/hr (04/12/22 0554)    PRN Medications: acetaminophen, albuterol, ALPRAZolam, docusate sodium, mouth rinse, polyethylene glycol    Patient Profile   77 y/o homeless male with COPD ad ongoing tobacco use, CAD, systolic HF EF 03-50%, colon CA previous  lung CA. 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 Also with a/c LE DVT.  EF also found to be down to 20-25%. RV severely reduced.   Assessment/Plan   1. 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  - S/P Mechanical Thrombectomy R Pulmonary Artery.  - On heparin drip. Eventual switch to elqiuis.   - Will need eventual VQ.    2. Acute Hypoxic Respiratory Failure, COPD -On 2 liters. Stable sats.    3. PAF  -Maintaining ST  -On amiodarone. ? if nausea is related to amiodarone. If nausea persist, consider stopping amiodarone. Looking back at his EKGs he has been in SR for several years.   -On heparin drip. Anticipate switch to eliquis.    4. Chronic HFrEF  -Echo EF down 20-25% with D shaped septum RV Failure, EF previously 40-45%.  -Volume status stable. Does not need diuretics.  - Consider bisoprolol in next day or so.  - Continue entresto, spiro, and digoxin.  - Hold off on farxiga.  - given drop in EF, consider LHC prior to committing to oral a/c. Will d/c MD    5. Lung nodule - Needs follow up CT in 3 months    6. Smoker  - Discussed smoking cessation.    7. [place]  - FULL Code    8. Social  - No permanent residence. [place] sur[place]. Consult TOC.   - Needs to be followed in the community. Will need Paramedicine. I will refer.    Length of Stay: [address] Ladoris Gene  04/12/2022, 7:43 AM  Advanced Heart Failure Team Pager [phone] (M-F; 7a - 5p)  Please contact [facility] for night-coverage after hours (5p -7a ) and weekends on [email or web address]  Patient seen and examined with the above-signed Advanced Practice Provider and/or Housestaff. I personally reviewed laboratory data, imaging studies and relevant notes. I independently examined the patient and formulated the important aspects of the plan. I have edited the note to reflect any of my changes or salient points. I have  personally discussed the plan with the patient and/or family.  Says he is more SOB this afternoon. Denies CP. On heparin. No bleeding  General:  Sitting in chair coughing  HEENT: normal Neck: supple. JVP 10  Carotids 2+ bilat; no  bruits. No lymphadenopathy or thryomegaly appreciated. Cor: PMI nondisplaced. Regular rate & rhythm. No rubs, gallops or murmurs. Lungs: decreased throughout Abdomen: soft, nontender, nondistended. No hepatosplenomegaly. No bruits or masses. Good bowel sounds. Extremities: no cyanosis, clubbing, rash, edema Neuro: alert & orientedx3, cranial nerves grossly intact. moves all 4 extremities w/o difficulty. Affect pleasant  Will plan R/L cath tomorrow to further assess coronaries given drop in EF. Once cath done can switch heparin to DOAC. 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  Glori Bickers, MD  2:57 PM

## 2022-04-12 NOTE — Telephone Encounter (Signed)
Checked pt's chart and saw that pt is currently admitted in the hospital.  Attempted to call pt but unable to reach. Left message for him to return call.  Have placed the order for the CT in 3 months and also placed a recall in pt's chart for the consult appt with Dr. Genia Harold in 3 months.

## 2022-04-12 NOTE — Progress Notes (Signed)
eLink Physician-Brief Progress Note Patient Name: Bruce [name] DOB: [birth year] MRN: [id number]   Date of Service  04/12/2022  HPI/Events of Note  Nursing relates that patient did not realize he was a DNR and now states he wants to be a full code.   eICU Interventions  Will change codes status to full code.      Intervention Category Major Interventions: End of life / care limitation discussion  Lysle Dingwall 04/12/2022, 2:12 AM

## 2022-04-12 NOTE — TOC Initial Note (Signed)
Transition of Care [facility]) - Initial/Assessment Note    Patient Details  Name: Bruce [name] MRN: [id number] Date of Birth: 04/05/1945  Transition of Care [facility]) CM/SW Contact:    Bjorn Pippin, LCSW Phone Number: 04/12/2022, 12:11 PM  Clinical Narrative:                 CSW met with pt at bedside to discuss recommendation for SNF. Pt is agreeable to dc to SNF and requested it be between [place] and [place]. Pt engaged in conversation disclosing he was losing hearing in his left ear and searching for a place to live. Pt reported that he came from a mobile home but will not return there. CSW informed pt that she will provide him with housing resources that might be helpful.   CSW completed fl2 and faxed out. TOC will continue to follow.   Expected Discharge Plan: Skilled Nursing Facility Barriers to Discharge: Continued Medical Work up   Patient Goals and CMS Choice Patient states their goals for this hospitalization and ongoing recovery are:: wants to recover          Expected Discharge Plan and Services   Discharge Planning Services: CM Consult   Living arrangements for the past 2 months: Mobile Home                                      Prior Living Arrangements/Services Living arrangements for the past 2 months: Mobile Home Lives with:: Pets, Friends Patient language and need for interpreter reviewed:: Yes Do you feel safe going back to the place where you live?: No   Pt reported he does not want to return back home and would like to find somehwere else to reisde.  Need for Family Participation in Patient Care: Yes (Comment) Care giver support system in place?: Yes (comment) Current home services: DME (cane) Criminal Activity/Legal Involvement Pertinent to Current Situation/Hospitalization: No - Comment as needed  Activities of Daily Living Home Assistive Devices/Equipment: None ADL Screening (condition at time of admission) Is the patient deaf or have  difficulty hearing?: Yes Does the patient have difficulty seeing, even when wearing glasses/contacts?: No Does the patient have difficulty concentrating, remembering, or making decisions?: No Does the patient have difficulty dressing or bathing?: No Does the patient have difficulty walking or climbing stairs?: No  Permission Sought/Granted Permission sought to share information with : Case Manager, PCP Permission granted to share information with : No              Emotional Assessment Appearance:: Appears stated age Attitude/Demeanor/Rapport: Engaged Affect (typically observed): Accepting Orientation: : Oriented to Self, Oriented to Place, Oriented to  Time, Oriented to Situation Alcohol / Substance Use: Not Applicable Psych Involvement: No (comment)  Admission diagnosis:  Pulmonary embolism (HCC) [I26.99] Acute respiratory failure with hypoxia (HCC) [J96.01] Other acute pulmonary embolism with acute cor pulmonale (HCC) [I26.09] Patient Active Problem List   Diagnosis Date Noted   Pulmonary embolism ([place]) 04/09/2022   Acute respiratory failure with hypoxia (HCC) 04/09/2022   Lactic acidosis 04/09/2022   Hypokalemia 04/09/2022   COPD with acute exacerbation ([place]) 05/03/2020   PAF (paroxysmal atrial fibrillation) ([place])    Syncope 12/31/2018   Intracranial bleed ([place]) 12/31/2018   AKI (acute kidney injury) ([place])    Frontal lobe contusion (HCC)    Multiple closed facial bone fractures (HCC)    NSTEMI (non-ST elevated myocardial infarction) (  [place]) 10/10/2018   Recurrent syncope 04/28/2018   Malignant neoplasm of prostate ([place]) 07/13/2016   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   Visual hallucination 03/18/2015   Auditory hallucination 03/18/2015   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 01/01/2015   Tibial plateau fracture 12/29/2014   Limb ischemia 11/18/2013   Blunt trauma of lower leg 10/08/2013   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[place]) 10/08/2013    Acute blood loss anemia 10/08/2013   Anxiety disorder 10/08/2013   Dyslipidemia 10/08/2013   GERD (gastroesophageal reflux disease) 10/08/2013   History of MI (myocardial infarction) 10/08/2013   Anticoagulated 10/08/2013   Hypertension    Tibia/fibula fracture 10/06/2013   CAD (coronary artery disease) 08/16/2010   Edema 08/16/2010   COPD (chronic obstructive pulmonary disease) ([place]) 08/16/2010   DVT of leg (deep venous thrombosis) ([place]) 08/16/2010   Leg cramps 08/16/2010   PCP:  Danelle Berry, NP Pharmacy:   [facility], [address] [place] [address] Phone: [phone] Fax: [phone]  [facility] [address] [place] [address] Phone: [phone] Fax: [phone]     Social Determinants of Health (SDOH) Social History: [place]: Food Insecurity Present (02/05/2017)  Transportation Needs: No Transportation Needs (02/05/2017)  Financial Resource Strain: High Risk (02/05/2017)  Physical Activity: Inactive (02/05/2017)  Social Connections: Moderately Isolated (02/05/2017)  Stress: Stress Concern Present (02/05/2017)  Tobacco Use: High Risk (04/10/2022)   SDOH Interventions:     Readmission Risk Interventions     No data to display         Beckey Rutter, MSW, LCSWA, LCASA Transitions of Care  Clinical Social Worker I

## 2022-04-12 NOTE — Progress Notes (Signed)
ANTICOAGULATION CONSULT NOTE - Follow Up Consult  Pharmacy Consult for Heparin Indication: pulmonary embolus and DVT  Allergies  Allergen Reactions   Adhesive [Tape] Other (See Comments)    After right leg fracture surgery, pt developed a large blister where tape was applied to his right leg. OK to use paper tape.   Imdur [Isosorbide Dinitrate] Other (See Comments)    hallucinations   Singulair [Montelukast Sodium] Other (See Comments)    Hallucinations     Patient Measurements: Height: '6\' 1"'$  (185.4 cm) Weight: 72.3 kg (159 lb 6.3 oz) IBW/kg (Calculated) : 79.9 Heparin Dosing Weight: 72.3 kg  Vital Signs: Temp: 98.5 F (36.9 C) (02/07 0700) Temp Source: Oral (02/07 0700) BP: 114/75 (02/07 0700) Pulse Rate: 97 (02/07 0700)  Labs: Recent Labs    04/09/22 1645 04/09/22 1805 04/09/22 1838 04/09/22 1935 04/10/22 0358 04/10/22 0901 04/10/22 1344 04/11/22 0622 04/11/22 1514 04/11/22 2159 04/12/22 0021  HGB 13.8   < >  --   --  13.3  --   --  12.0*  --   --  11.7*  HCT 41.8   < >  --   --  39.4  --   --  35.3*  --   --  35.7*  PLT 162  --   --   --  154  --   --  167  --   --  168  APTT  --   --   --    < > 139* 100*  --  138*  --   --   --   LABPROT 17.0*  --   --   --   --   --   --   --   --   --   --   INR 1.4*  --   --   --   --   --   --   --   --   --   --   HEPARINUNFRC  --   --   --    < > 1.01* 0.92*   < > 1.00* 0.63 0.59 0.56  CREATININE 1.07  --   --   --  0.97 0.98  --   --   --   --  0.96  TROPONINIHS 231*  --  232*  --   --  187*  --   --   --   --   --    < > = values in this interval not displayed.    Estimated Creatinine Clearance: 69 mL/min (by C-G formula based on SCr of 0.96 mg/dL).  Assessment: 82 yom presenting 04/09/22 with near syncope and SOB. Heparin per pharmacy consult placed for pulmonary embolus. Extensive bilateral PE with evidence of right heart strain. S/p mechanical thrombectomy 2/5.  Also with bilateral DVT per duplex.Was on Xarelto  prior to admission for prior PE, but noted to have missed several doses PTA due to nausea.  Noted plan to change to Eliquis when back to oral anticoagulation.  Heparin level remains therapeutic (0.56) on 1200 units/hr. CBC stable.  Goal of Therapy:  Heparin level 0.3-0.7 units/ml Monitor platelets by anticoagulation protocol: Yes   Plan:  Continue heparin drip at 1200 units/hr Daily heparin level and CBC while on heparin. Follow up for transition to Eliquis when no more procedures.  Arty Baumgartner, [place] 04/12/2022,9:33 AM

## 2022-04-12 NOTE — TOC Initial Note (Signed)
Transition of Care [facility]) - Initial/Assessment Note    Patient Details  Name: Bruce [name] MRN: [id number] Date of Birth: 02-Feb-1946  Transition of Care [facility]) CM/SW Contact:    Erenest Rasher, RN Phone Number:[phone] 04/12/2022, 7:13 AM  Clinical Narrative:                 Pt states he lives with friends. He is planning to get his own place. States he is currently looking. Pt requesting a cane. Sent referral to [place], Erasmo Downer.  Expected Discharge Plan: Home/Self Care Barriers to Discharge: Continued Medical Work up   Patient Goals and CMS Choice Patient states their goals for this hospitalization and ongoing recovery are:: wants to recover          Expected Discharge Plan and Services   Discharge Planning Services: CM Consult   Living arrangements for the past 2 months: Mobile Home                                      Prior Living Arrangements/Services Living arrangements for the past 2 months: Mobile Home Lives with:: Friends Patient language and need for interpreter reviewed:: Yes Do you feel safe going back to the place where you live?: Yes      Need for Family Participation in Patient Care: No (Comment) Care giver support system in place?: Yes (comment) Current home services: DME (cane) Criminal Activity/Legal Involvement Pertinent to Current Situation/Hospitalization: No - Comment as needed  Activities of Daily Living Home Assistive Devices/Equipment: None ADL Screening (condition at time of admission) Is the patient deaf or have difficulty hearing?: Yes Does the patient have difficulty seeing, even when wearing glasses/contacts?: No Does the patient have difficulty concentrating, remembering, or making decisions?: No Does the patient have difficulty dressing or bathing?: No Does the patient have difficulty walking or climbing stairs?: No  Permission Sought/Granted Permission sought to share information with : Case Manager, PCP                 Emotional Assessment Appearance:: Appears stated age Attitude/Demeanor/Rapport: Engaged Affect (typically observed): Accepting Orientation: : Oriented to Self, Oriented to Place, Oriented to  Time, Oriented to Situation   Psych Involvement: No (comment)  Admission diagnosis:  Pulmonary embolism (HCC) [I26.99] Acute respiratory failure with hypoxia ([place]) [J96.01] Other acute pulmonary embolism with acute cor pulmonale (HCC) [I26.09] Patient Active Problem List   Diagnosis Date Noted   Pulmonary embolism ([place]) 04/09/2022   Acute respiratory failure with hypoxia ([place]) 04/09/2022   Lactic acidosis 04/09/2022   Hypokalemia 04/09/2022   COPD with acute exacerbation ([place]) 05/03/2020   PAF (paroxysmal atrial fibrillation) ([place])    Syncope 12/31/2018   Intracranial bleed ([place]) 12/31/2018   AKI (acute kidney injury) ([place])    Frontal lobe contusion ([place])    Multiple closed facial bone fractures (HCC)    NSTEMI (non-ST elevated myocardial infarction) ([place]) 10/10/2018   Recurrent syncope 04/28/2018   Malignant neoplasm of prostate ([place]) 07/13/2016   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   Visual hallucination 03/18/2015   Auditory hallucination 03/18/2015   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 01/01/2015   Tibial plateau fracture 12/29/2014   Limb ischemia 11/18/2013   Blunt trauma of lower leg 10/08/2013   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[place]) 10/08/2013   Acute blood loss anemia 10/08/2013   Anxiety disorder 10/08/2013   Dyslipidemia 10/08/2013  GERD (gastroesophageal reflux disease) 10/08/2013   History of MI (myocardial infarction) 10/08/2013   Anticoagulated 10/08/2013   Hypertension    Tibia/fibula fracture 10/06/2013   CAD (coronary artery disease) 08/16/2010   Edema 08/16/2010   COPD (chronic obstructive pulmonary disease) ([place]) 08/16/2010   DVT of leg (deep venous thrombosis) ([place]) 08/16/2010   Leg cramps 08/16/2010   PCP:   Danelle Berry, NP Pharmacy:   [facility], [place] [address] Phone: [phone] Fax: [phone]  [facility] [address] [place] [address] Phone: [phone] Fax: [phone]     Social Determinants of Health (SDOH) Social History: [place]: Food Insecurity Present (02/05/2017)  Transportation Needs: No Transportation Needs (02/05/2017)  Financial Resource Strain: High Risk (02/05/2017)  Physical Activity: Inactive (02/05/2017)  Social Connections: Moderately Isolated (02/05/2017)  Stress: Stress Concern Present (02/05/2017)  Tobacco Use: High Risk (04/10/2022)   SDOH Interventions:     Readmission Risk Interventions     No data to display

## 2022-04-12 NOTE — Plan of Care (Signed)

## 2022-04-12 NOTE — Evaluation (Signed)
Physical Therapy Evaluation Patient Details Name: Bruce [name] MRN: [id number] DOB: Jan 04, 1946 Today's Date: 04/12/2022  History of Present Illness  Pt is a 77 y.o. male who presented 04/09/22 with SOB and chest pain. Pt found to have bil PE with right heart strain and bil lower extremity DVTs. S/p PA angiogram and thrombectomy 2/5. PMH: cancer, CHF, COPD, coronary artery disease, GERD, HTN, MI, PVD   Clinical Impression  Pt presents with condition above and deficits mentioned below, see PT Problem List. PTA, he was living with a couple friends in a mobile home with 5 STE without handrails. He reports an unsanitary living situation and limited available support at d/c. Pt reports he fell onto his L hip a couple weeks ago, resulting in pain and limited mobility since. He was only holding onto furniture for support before this fall but has since been relying on his cane. Pt demonstrates limited weight bearing tolerance on his L lower extremity along with pain limiting his L hip AROM and L lower extremity strength today, thus notified RN and MD. Plan is now for radiograph of his hip per MD. Pt endorses x1-2 falls/month on average. He required minA for transfers and to ambulate a short distance within the room with RW support today. His SpO2 would decrease down to the low 80s% intermittently on 4L at rest and when ambulating on 6L, recovered to 92% on 4L reclined end of session. Pt remains at high risk for falls and has limited support available at d/c, thus recommending short-term rehab at a SNF upon d/c to maximize his safety and independence with all functional mobility. Will continue to follow acutely.     Recommendations for follow up therapy are one component of a multi-disciplinary discharge planning process, led by the attending physician.  Recommendations may be updated based on patient status, additional functional criteria and insurance authorization.  Follow Up Recommendations Skilled  nursing-short term rehab (<3 hours/day) Can patient physically be transported by private vehicle: Yes    Assistance Recommended at Discharge Intermittent Supervision/Assistance  Patient can return home with the following  A little help with walking and/or transfers;A little help with bathing/dressing/bathroom;Assistance with cooking/housework;Assist for transportation;Help with stairs or ramp for entrance    Equipment Recommendations Rolling walker (2 wheels);BSC/3in1  Recommendations for Other Services       Functional Status Assessment Patient has had a recent decline in their functional status and demonstrates the ability to make significant improvements in function in a reasonable and predictable amount of time.     Precautions / Restrictions Precautions Precautions: Fall;Other (comment) Precaution Comments: watch SpO2 Restrictions Weight Bearing Restrictions: No Other Position/Activity Restrictions: notified Dr. Starla Link of pt's L hip pain and difficulty weight bearing with gait this session, reportedly began after a fall a couple weeks ago--plan for radiograph of hip      Mobility  Bed Mobility Overal bed mobility: Needs Assistance Bed Mobility: Supine to Sit     Supine to sit: Supervision, HOB elevated     General bed mobility comments: Extra time to complete, supervision for safety, HOB elevated.    Transfers Overall transfer level: Needs assistance Equipment used: Rolling walker (2 wheels) Transfers: Sit to/from Stand Sit to Stand: Min assist           General transfer comment: MinA for stability and to complete power up to stand from EOB to RW, extra time and pt favoring R leg due to L hip pain. Cues provided for hand placement.  Ambulation/Gait Ambulation/Gait assistance: Min assist Gait Distance (Feet): 16 Feet Assistive device: Rolling walker (2 wheels) Gait Pattern/deviations: Step-to pattern, Decreased stride length, Decreased step length - right,  Decreased stance time - left, Decreased weight shift to left, Trunk flexed, Antalgic Gait velocity: reduced Gait velocity interpretation: <1.31 ft/sec, indicative of household ambulator   General Gait Details: Pt with slow, antalgic step-to gait pattern due to L hip pain, needing minA for stability and cues to unweight L leg by pushing through RW.  Stairs            Wheelchair Mobility    Modified Rankin (Stroke Patients Only)       Balance Overall balance assessment: Needs assistance Sitting-balance support: No upper extremity supported, Feet supported Sitting balance-Leahy Scale: Fair Sitting balance - Comments: Static sitting EOB with supervision for safety   Standing balance support: Bilateral upper extremity supported, Reliant on assistive device for balance, During functional activity Standing balance-Leahy Scale: Poor Standing balance comment: Reliant on RW and up to minA                             Pertinent Vitals/Pain Pain Assessment Pain Assessment: Faces Faces Pain Scale: Hurts even more Pain Location: L hip Pain Descriptors / Indicators: Discomfort, Grimacing, Guarding Pain Intervention(s): Limited activity within patient's tolerance, Monitored during session, Repositioned    Home Living Family/patient expects to be discharged to:: Private residence Living Arrangements: Non-relatives/Friends Available Help at Discharge: Friend(s);Available PRN/intermittently (x1 friend is limited herself and the other likely will not assist) Type of Home: Mobile home Home Access: Stairs to enter Entrance Stairs-Rails: None Entrance Stairs-Number of Steps: 5   Home Layout: One level Home Equipment: Cane - single point      Prior Function Prior Level of Function : Independent/Modified Independent;Driving;History of Falls (last six months)             Mobility Comments: x1-2 falls/month on average. Was holding onto furniture for support prior to his  fall a couple weeks ago in which he landed on his L hip and have had pain and difficulty weight bearing since, thus resulting in him using his [facility] recently       Hand Dominance        Extremity/Trunk Assessment   Upper Extremity Assessment Upper Extremity Assessment: Defer to OT evaluation    Lower Extremity Assessment Lower Extremity Assessment: LLE deficits/detail LLE Deficits / Details: L hip pain limiting hip AROM, weight bearing tolerance, and strength of leg, MMT scores of 3+ hip flexion, 4- knee extension; denied numbness/tingling bil    Cervical / Trunk Assessment Cervical / Trunk Assessment: Normal  Communication   Communication: No difficulties  Cognition Arousal/Alertness: Awake/alert Behavior During Therapy: WFL for tasks assessed/performed Overall Cognitive Status: Within Functional Limits for tasks assessed                                          General Comments General comments (skin integrity, edema, etc.): SpO2 dropping as low as 84% on 4L sitting EOB, as low as 82% (when pleth would read) on 6L with gait, 92% on 4L reclined end of session    Exercises     Assessment/Plan    PT Assessment Patient needs continued PT services  PT Problem List Decreased strength;Decreased range of motion;Decreased activity tolerance;Decreased balance;Decreased mobility;Cardiopulmonary status limiting activity;Pain  PT Treatment Interventions DME instruction;Gait training;Stair training;Functional mobility training;Therapeutic activities;Balance training;Therapeutic exercise;Neuromuscular re-education;Patient/family education    PT Goals (Current goals can be found in the Care Plan section)  Acute Rehab PT Goals Patient Stated Goal: to find a new home PT Goal Formulation: With patient Time For Goal Achievement: 04/26/22 Potential to Achieve Goals: Good    Frequency Min 2X/week     Co-evaluation               AM-PAC PT "6 Clicks"  Mobility  Outcome Measure Help needed turning from your back to your side while in a flat bed without using bedrails?: A Little Help needed moving from lying on your back to sitting on the side of a flat bed without using bedrails?: A Little Help needed moving to and from a bed to a chair (including a wheelchair)?: A Little Help needed standing up from a chair using your arms (e.g., wheelchair or bedside chair)?: A Little Help needed to walk in hospital room?: Total Help needed climbing 3-5 steps with a railing? : Total 6 Click Score: 14    End of Session Equipment Utilized During Treatment: Gait belt;Oxygen Activity Tolerance: Patient limited by pain;Patient limited by fatigue Patient left: in chair;with call bell/phone within reach;with chair alarm set Nurse Communication: Mobility status;Other (comment) (sats, no grey cord connecting chair alarm box to nursing station -- notified RN and MD of pt's L hip pain from fall several weeks ago limiting weight bearing tolerance) PT Visit Diagnosis: Unsteadiness on feet (R26.81);Other abnormalities of gait and mobility (R26.89);Muscle weakness (generalized) (M62.81);History of falling (Z91.81);Repeated falls (R29.6);Difficulty in walking, not elsewhere classified (R26.2);Pain Pain - Right/Left: Left Pain - part of body: Hip    Time: 0825-0904 PT Time Calculation (min) (ACUTE ONLY): 39 min   Charges:   PT Evaluation $PT Eval Moderate Complexity: 1 Mod PT Treatments $Gait Training: 8-22 mins $Therapeutic Activity: 8-22 mins        Moishe Spice, PT, DPT Acute Rehabilitation Services  Office: [phone]   Orvan Falconer 04/12/2022, 9:21 AM

## 2022-04-13 ENCOUNTER — Encounter (HOSPITAL_COMMUNITY)
Admission: EM | Disposition: A | Discharge status: Skilled Nursing Facility | Payer: Self-pay | Source: Home / Self Care | Attending: Internal Medicine

## 2022-04-13 ENCOUNTER — Other Ambulatory Visit (HOSPITAL_COMMUNITY): Payer: Self-pay

## 2022-04-13 DIAGNOSIS — I5023 Acute on chronic systolic (congestive) heart failure: Secondary | ICD-10-CM | POA: Insufficient documentation

## 2022-04-13 DIAGNOSIS — J9601 Acute respiratory failure with hypoxia: Secondary | ICD-10-CM | POA: Diagnosis not present

## 2022-04-13 DIAGNOSIS — I5021 Acute systolic (congestive) heart failure: Secondary | ICD-10-CM

## 2022-04-13 DIAGNOSIS — I5022 Chronic systolic (congestive) heart failure: Secondary | ICD-10-CM | POA: Insufficient documentation

## 2022-04-13 DIAGNOSIS — I5043 Acute on chronic combined systolic (congestive) and diastolic (congestive) heart failure: Secondary | ICD-10-CM | POA: Insufficient documentation

## 2022-04-13 DIAGNOSIS — I2609 Other pulmonary embolism with acute cor pulmonale: Secondary | ICD-10-CM | POA: Diagnosis not present

## 2022-04-13 HISTORY — PX: RIGHT/LEFT HEART CATH AND CORONARY ANGIOGRAPHY: CATH118266

## 2022-04-13 LAB — POCT I-STAT 7, (LYTES, BLD GAS, ICA,H+H)
Acid-base deficit: 5 mmol/L — ABNORMAL HIGH (ref 0.0–2.0)
Bicarbonate: 17.7 mmol/L — ABNORMAL LOW (ref 20.0–28.0)
Calcium, Ion: 1.08 mmol/L — ABNORMAL LOW (ref 1.15–1.40)
HCT: 31 % — ABNORMAL LOW (ref 39.0–52.0)
Hemoglobin: 10.5 g/dL — ABNORMAL LOW (ref 13.0–17.0)
O2 Saturation: 90 %
Potassium: 3.6 mmol/L (ref 3.5–5.1)
Sodium: 144 mmol/L (ref 135–145)
TCO2: 19 mmol/L — ABNORMAL LOW (ref 22–32)
pCO2 arterial: 26.5 mmHg — ABNORMAL LOW (ref 32–48)
pH, Arterial: 7.434 (ref 7.35–7.45)
pO2, Arterial: 56 mmHg — ABNORMAL LOW (ref 83–108)

## 2022-04-13 LAB — BASIC METABOLIC PANEL
Anion gap: 7 (ref 5–15)
BUN: 17 mg/dL (ref 8–23)
CO2: 22 mmol/L (ref 22–32)
Calcium: 8.5 mg/dL — ABNORMAL LOW (ref 8.9–10.3)
Chloride: 106 mmol/L (ref 98–111)
Creatinine, Ser: 1.11 mg/dL (ref 0.61–1.24)
GFR, Estimated: >60 mL/min (ref >60)
Glucose, Bld: 102 mg/dL — ABNORMAL HIGH (ref 70–99)
Potassium: 3.5 mmol/L (ref 3.5–5.1)
Sodium: 135 mmol/L (ref 135–145)

## 2022-04-13 LAB — CBC
HCT: 33.1 % — ABNORMAL LOW (ref 39.0–52.0)
Hemoglobin: 11.5 g/dL — ABNORMAL LOW (ref 13.0–17.0)
MCH: 31.5 pg (ref 26.0–34.0)
MCHC: 34.7 g/dL (ref 30.0–36.0)
MCV: 90.7 fL (ref 80.0–100.0)
Platelets: 168 10*3/uL (ref 150–400)
RBC: 3.65 MIL/uL — ABNORMAL LOW (ref 4.22–5.81)
RDW: 13.7 % (ref 11.5–15.5)
WBC: 7.4 10*3/uL (ref 4.0–10.5)
nRBC: 0 % (ref 0.0–0.2)

## 2022-04-13 LAB — POCT I-STAT EG7
Acid-Base Excess: 0 mmol/L (ref 0.0–2.0)
Acid-base deficit: 1 mmol/L (ref 0.0–2.0)
Bicarbonate: 23 mmol/L (ref 20.0–28.0)
Bicarbonate: 23.7 mmol/L (ref 20.0–28.0)
Calcium, Ion: 1.27 mmol/L (ref 1.15–1.40)
Calcium, Ion: 1.28 mmol/L (ref 1.15–1.40)
HCT: 34 % — ABNORMAL LOW (ref 39.0–52.0)
HCT: 35 % — ABNORMAL LOW (ref 39.0–52.0)
Hemoglobin: 11.6 g/dL — ABNORMAL LOW (ref 13.0–17.0)
Hemoglobin: 11.9 g/dL — ABNORMAL LOW (ref 13.0–17.0)
O2 Saturation: 50 %
O2 Saturation: 51 %
Potassium: 4 mmol/L (ref 3.5–5.1)
Potassium: 4.1 mmol/L (ref 3.5–5.1)
Sodium: 140 mmol/L (ref 135–145)
Sodium: 141 mmol/L (ref 135–145)
TCO2: 24 mmol/L (ref 22–32)
TCO2: 25 mmol/L (ref 22–32)
pCO2, Ven: 34.7 mmHg — ABNORMAL LOW (ref 44–60)
pCO2, Ven: 35.5 mmHg — ABNORMAL LOW (ref 44–60)
pH, Ven: 7.43 (ref 7.25–7.43)
pH, Ven: 7.433 — ABNORMAL HIGH (ref 7.25–7.43)
pO2, Ven: 26 mmHg — CL (ref 32–45)
pO2, Ven: 26 mmHg — CL (ref 32–45)

## 2022-04-13 LAB — MAGNESIUM: Magnesium: 2 mg/dL (ref 1.7–2.4)

## 2022-04-13 LAB — HEPARIN LEVEL (UNFRACTIONATED): Heparin Unfractionated: 0.33 IU/mL (ref 0.30–0.70)

## 2022-04-13 SURGERY — RIGHT/LEFT HEART CATH AND CORONARY ANGIOGRAPHY
Anesthesia: LOCAL

## 2022-04-13 MED ORDER — HEPARIN (PORCINE) IN NACL 1000-0.9 UT/500ML-% IV SOLN
INTRAVENOUS | Status: AC
  Filled 2022-04-13: qty 500

## 2022-04-13 MED ORDER — APIXABAN 5 MG PO TABS: 10.0000 mg | ORAL_TABLET | Freq: Two times a day (BID) | ORAL | Status: DC

## 2022-04-13 MED ORDER — SODIUM CHLORIDE 0.9 % IV SOLN: 250.0000 mL | INTRAVENOUS | Status: DC | PRN

## 2022-04-13 MED ORDER — SODIUM CHLORIDE 0.9% FLUSH: 3.0000 mL | INTRAVENOUS | Status: DC | PRN

## 2022-04-13 MED ORDER — HEPARIN SODIUM (PORCINE) 1000 UNIT/ML IJ SOLN
INTRAMUSCULAR | Status: DC | PRN
  Administered 2022-04-13: 3500 [IU] via INTRAVENOUS

## 2022-04-13 MED ORDER — APIXABAN 5 MG PO TABS: 5.0000 mg | ORAL_TABLET | Freq: Two times a day (BID) | ORAL | Status: DC

## 2022-04-13 MED ORDER — SODIUM CHLORIDE 0.9 % IV SOLN: INTRAVENOUS | Status: DC

## 2022-04-13 MED ORDER — ASPIRIN 81 MG PO CHEW
81.0000 mg | CHEWABLE_TABLET | ORAL | Status: AC
  Administered 2022-04-13: 81 mg via ORAL
  Filled 2022-04-13: qty 1

## 2022-04-13 MED ORDER — APIXABAN 5 MG PO TABS
10.0000 mg | ORAL_TABLET | Freq: Two times a day (BID) | ORAL | Status: DC
  Administered 2022-04-13 – 2022-04-18 (×10): 10 mg via ORAL
  Filled 2022-04-13 (×10): qty 2

## 2022-04-13 MED ORDER — ASPIRIN 81 MG PO CHEW: 81.0000 mg | CHEWABLE_TABLET | ORAL | Status: DC

## 2022-04-13 MED ORDER — HEPARIN SODIUM (PORCINE) 1000 UNIT/ML IJ SOLN
INTRAMUSCULAR | Status: AC
  Filled 2022-04-13: qty 10

## 2022-04-13 MED ORDER — LIDOCAINE HCL (PF) 1 % IJ SOLN
INTRAMUSCULAR | Status: AC
  Filled 2022-04-13: qty 30

## 2022-04-13 MED ORDER — SODIUM CHLORIDE 0.9% FLUSH
3.0000 mL | Freq: Two times a day (BID) | INTRAVENOUS | Status: DC
  Administered 2022-04-13 – 2022-04-17 (×10): 3 mL via INTRAVENOUS

## 2022-04-13 MED ORDER — RIVAROXABAN 20 MG PO TABS: 20.0000 mg | ORAL_TABLET | Freq: Every day | ORAL | Status: DC

## 2022-04-13 MED ORDER — HYDRALAZINE HCL 20 MG/ML IJ SOLN: 10.0000 mg | INTRAMUSCULAR | Status: AC | PRN

## 2022-04-13 MED ORDER — VERAPAMIL HCL 2.5 MG/ML IV SOLN
INTRAVENOUS | Status: AC
  Filled 2022-04-13: qty 2

## 2022-04-13 MED ORDER — VERAPAMIL HCL 2.5 MG/ML IV SOLN
INTRAVENOUS | Status: DC | PRN
  Administered 2022-04-13: 10 mL via INTRA_ARTERIAL

## 2022-04-13 MED ORDER — AMIODARONE HCL 200 MG PO TABS
200.0000 mg | ORAL_TABLET | Freq: Every day | ORAL | Status: DC
  Administered 2022-04-14 – 2022-04-17 (×4): 200 mg via ORAL
  Filled 2022-04-13 (×4): qty 1

## 2022-04-13 MED ORDER — HEPARIN (PORCINE) IN NACL 1000-0.9 UT/500ML-% IV SOLN
INTRAVENOUS | Status: DC | PRN
  Administered 2022-04-13 (×2): 500 mL

## 2022-04-13 MED ORDER — IOHEXOL 350 MG/ML SOLN
INTRAVENOUS | Status: DC | PRN
  Administered 2022-04-13: 40 mL

## 2022-04-13 MED ORDER — LIDOCAINE HCL (PF) 1 % IJ SOLN
INTRAMUSCULAR | Status: DC | PRN
  Administered 2022-04-13 (×3): 2 mL via INTRADERMAL

## 2022-04-13 SURGICAL SUPPLY — 13 items
CATH 5FR JL3.5 JR4 ANG PIG MP (CATHETERS) IMPLANT
CATH BALLN WEDGE 5F 110CM (CATHETERS) IMPLANT
DEVICE RAD COMP TR BAND LRG (VASCULAR PRODUCTS) IMPLANT
GLIDESHEATH SLEND SS 6F .021 (SHEATH) IMPLANT
GUIDEWIRE .025 260CM (WIRE) IMPLANT
GUIDEWIRE INQWIRE 1.5J.035X260 (WIRE) IMPLANT
INQWIRE 1.5J .035X260CM (WIRE) ×1
PACK CARDIAC CATHETERIZATION (CUSTOM PROCEDURE TRAY) ×1 IMPLANT
SHEATH GLIDE SLENDER 4/5FR (SHEATH) IMPLANT
SHEATH PROBE COVER 6X72 (BAG) IMPLANT
TRANSDUCER W/STOPCOCK (MISCELLANEOUS) ×1 IMPLANT
WIRE MICRO SET SILHO 5FR 7 (SHEATH) IMPLANT
WIRE MICROINTRODUCER 60CM (WIRE) IMPLANT

## 2022-04-13 NOTE — Care Management Important Message (Signed)
Important Message  Patient Details  Name: Bruce [name] MRN: [id number] Date of Birth: 1945/12/30   Medicare Important Message Given:  Yes     [name] Montine Circle 04/13/2022, 1:45 PM

## 2022-04-13 NOTE — TOC Progression Note (Signed)
Transition of Care [facility]) - Progression Note    Patient Details  Name: AZAIAH LICCIARDI MRN: [id number] Date of Birth: 10-02-1945  Transition of Care [facility]) CM/SW [place], LCSW Phone Number: 04/13/2022, 4:11 PM  Clinical Narrative:    CSW met with pt to provide list of SNF bed offers. [name] as he believes [facility]'s close to [place] which is where he wants to be. CSW reached out to [facility]. [name] stated that potentially may have a bed available tomorrow, but was unsure and will update CSW tomorrow morning.   TOC will continue to follow this admission.    Expected Discharge Plan: [place] Barriers to Discharge: Continued Medical Work up  Expected Discharge Plan and Services   Discharge Planning Services: CM Consult   Living arrangements for the past 2 months: Mobile Home                                       Social Determinants of Health (SDOH) Interventions SDOH Screenings   Food Insecurity: No Food Insecurity (04/12/2022)  Housing: High Risk (04/12/2022)  Transportation Needs: No Transportation Needs (04/12/2022)  Financial Resource Strain: High Risk (04/12/2022)  Physical Activity: Inactive (02/05/2017)  Social Connections: Moderately Isolated (02/05/2017)  Stress: Stress Concern Present (02/05/2017)  Tobacco Use: High Risk (04/10/2022)    Readmission Risk Interventions     No data to display         Beckey Rutter, MSW, LCSWA, LCASA Transitions of Care  Clinical Social Worker I

## 2022-04-13 NOTE — Progress Notes (Addendum)
Advanced Heart Failure Rounding Note  PCP-Cardiologist: Neoma Laming, MD   Subjective:    On heparin gtt. Remains on 4L [place]. BP stable. Breathing improving. Awaiting RHC/LHC.    Objective:   Weight Range: 72 kg Body mass index is 20.94 kg/m.   Vital Signs:   Temp:  [97.7 F (36.5 C)-98.8 F (37.1 C)] 98.7 F (37.1 C) (02/08 0736) Pulse Rate:  [86-94] 91 (02/08 0736) Resp:  [15-20] 20 (02/08 0736) BP: (99-134)/(61-88) 125/88 (02/08 0736) SpO2:  [90 %-95 %] 95 % (02/08 0736) Weight:  [72 kg] 72 kg (02/08 0349) Last BM Date : 04/11/22  Weight change: Filed Weights   04/11/22 2100 04/12/22 0500 04/13/22 0349  Weight: 72.3 kg 72.3 kg 72 kg    Intake/Output:   Intake/Output Summary (Last 24 hours) at 04/13/2022 0756 Last data filed at 04/13/2022 0240 Gross per 24 hour  Intake 683.69 ml  Output 1150 ml  Net -466.31 ml      Physical Exam    General:  elderly male sitting up on side of bed. No resp difficulty HEENT: Normal Neck: Supple. JVD not elevated . Carotids 2+ bilat; no bruits. No lymphadenopathy or thyromegaly appreciated. Cor: PMI nondisplaced. Regular rate & rhythm. No rubs, gallops or murmurs. Lungs: Clear Abdomen: Soft, nontender, nondistended. No hepatosplenomegaly. No bruits or masses. Good bowel sounds. Extremities: No cyanosis, clubbing, rash, edema Neuro: Alert & orientedx3, cranial nerves grossly intact. moves all 4 extremities w/o difficulty. Affect pleasant   Telemetry   NSR 90s   EKG    N/A   Labs    CBC Recent Labs    04/12/22 0021 04/13/22 0014  WBC 10.6* 7.4  HGB 11.7* 11.5*  HCT 35.7* 33.1*  MCV 94.2 90.7  PLT 168 128   Basic Metabolic Panel Recent Labs    04/12/22 0021 04/13/22 0014  NA 138 135  K 3.8 3.5  CL 108 106  CO2 21* 22  GLUCOSE 95 102*  BUN 18 17  CREATININE 0.96 1.11  CALCIUM 8.5* 8.5*  MG  --  2.0   Liver Function Tests No results for input(s): "AST", "ALT", "ALKPHOS", "BILITOT", "PROT",  "ALBUMIN" in the last 72 hours.  No results for input(s): "LIPASE", "AMYLASE" in the last 72 hours. Cardiac Enzymes No results for input(s): "CKTOTAL", "CKMB", "CKMBINDEX", "TROPONINI" in the last 72 hours.  BNP: BNP (last 3 results) Recent Labs    04/09/22 1645 04/10/22 0901  BNP 537.6* 1,012.6*    ProBNP (last 3 results) No results for input(s): "PROBNP" in the last 8760 hours.   D-Dimer No results for input(s): "DDIMER" in the last 72 hours. Hemoglobin A1C No results for input(s): "HGBA1C" in the last 72 hours. Fasting Lipid Panel No results for input(s): "CHOL", "HDL", "LDLCALC", "TRIG", "CHOLHDL", "LDLDIRECT" in the last 72 hours. Thyroid Function Tests No results for input(s): "TSH", "T4TOTAL", "T3FREE", "THYROIDAB" in the last 72 hours.  Invalid input(s): "FREET3"  Other results:   Imaging    DG HIP UNILAT WITH PELVIS 2-3 VIEWS LEFT  Result Date: 04/12/2022 CLINICAL DATA:  Left hip pain after fall 2 months ago at home. EXAM: DG HIP (WITH OR WITHOUT PELVIS) 2-3V LEFT COMPARISON:  None Available. FINDINGS: KUB 05/25/2016 IMPRESSION: No fracture or dislocation. Mild degenerative changes. The left femoroacetabular joint space is maintained. Mild superolateral left acetabular and mild lateral femoral head-neck junction degenerative osteophytosis. No acute fracture or dislocation. Mild right femoroacetabular joint space narrowing. Mild right L4-5 disc space narrowing. New brachytherapy  seeds overlie the prostate. Mild vascular calcifications. Electronically Signed   By: Yvonne Kendall M.D.   On: 04/12/2022 13:56     Medications:     Scheduled Medications:  amiodarone  200 mg Oral Daily   aspirin  81 mg Oral Pre-Cath   digoxin  0.125 mg Oral Daily   pantoprazole  40 mg Oral Daily   rOPINIRole  1 mg Oral QHS   rosuvastatin  10 mg Oral Daily   sacubitril-valsartan  1 tablet Oral BID   sodium chloride flush  3 mL Intravenous Q12H   spironolactone  25 mg Oral Daily    tamsulosin  0.4 mg Oral QPC supper   umeclidinium-vilanterol  1 puff Inhalation Daily    Infusions:  sodium chloride     heparin 1,200 Units/hr (04/13/22 0317)    PRN Medications: acetaminophen, albuterol, ALPRAZolam, docusate sodium, mouth rinse, polyethylene glycol    Patient Profile   77 y/o homeless male with COPD ad ongoing tobacco use, CAD, systolic HF EF 24-09%, colon CA previous lung CA. 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 Also with a/c LE DVT.  EF also found to be down to 20-25%. RV severely reduced.   Assessment/Plan   1. 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  - S/P Mechanical Thrombectomy R Pulmonary Artery.  - On heparin drip. Plan switch to elqiuis post LHC  - Will need eventual VQ scan (can do outpatient in several months)   2. Acute Hypoxic Respiratory Failure, COPD -On 4 liters. Stable sats.    3. PAF  -Maintaining ST  -On amiodarone. ? if nausea is related to amiodarone. If nausea persist, consider stopping amiodarone. Looking back at his EKGs he has been in SR for several years.   -On heparin drip. Anticipate switch to eliquis.    4. Chronic HFrEF  -Echo EF down 20-25% with D shaped septum RV Failure, EF previously 40-45%.  -Volume status stable. Does not need diuretics.  - Consider bisoprolol in next day or so.  - Continue entresto, spiro, and digoxin.  - Hold off on farxiga.  - given drop in EF, plan LHC today to exclude obstructive CAD    5. Lung nodule - Needs follow up CT in 3 months. Has been arranged w/ [facility]    6. Smoker  - Discussed smoking cessation.    7. [place]  - FULL Code    8. Social  - No permanent residence. [place]. SW team assisting w/ finding income based housing options  - Needs to be followed in the community. Will need Paramedicine. I will refer.    Length of Stay: [address]  [place]  04/13/2022, 7:56 AM  Advanced Heart Failure Team Pager [phone] (M-F; 7a - 5p)  Please contact [facility] for night-coverage after hours (5p -7a ) and weekends on [email or web address]  Patient seen and examined with the above-signed Advanced Practice Provider and/or Housestaff. I personally reviewed laboratory data, imaging studies and relevant notes. I independently examined the patient and formulated the important aspects of the plan. I have edited the note to reflect any of my changes or salient points. I have personally discussed the plan with the patient and/or family.  Breathing better today. Denies CP. On heparin. No bleeding  General: Sitting up in chair . No resp difficulty HEENT: normal Neck: supple. JVP 8. Carotids 2+ bilat; no bruits. No  lymphadenopathy or thryomegaly appreciated. Cor: PMI nondisplaced. Regular rate & rhythm. No rubs, gallops or murmurs. Lungs: decreased throughotu Abdomen: soft, nontender, nondistended. No hepatosplenomegaly. No bruits or masses. Good bowel sounds. Extremities: no cyanosis, clubbing, rash, edema Neuro: alert & orientedx3, cranial nerves grossly intact. moves all 4 extremities w/o difficulty. Affect pleasant  Breathing improved. For R/L cath today to further evaluate for [facility] and CAD. Switch to Eliquis after cath. Will need outpatient f/u for [facility] and HF including w/u for possible CTEPH.   Glori Bickers, MD  9:59 AM

## 2022-04-13 NOTE — Interval H&P Note (Signed)
History and Physical Interval Note:  04/13/2022 10:01 AM  Bruce [name]  has presented today for surgery, with the diagnosis of heart failure and PAH.  The various methods of treatment have been discussed with the patient and family. After consideration of risks, benefits and other options for treatment, the patient has consented to  Procedure(s): RIGHT/LEFT HEART CATH AND CORONARY ANGIOGRAPHY (N/A) as a surgical intervention.  The patient's history has been reviewed, patient examined, no change in status, stable for surgery.  I have reviewed the patient's chart and labs.  Questions were answered to the patient's satisfaction.     Herold Salguero

## 2022-04-13 NOTE — Progress Notes (Signed)
ANTICOAGULATION CONSULT NOTE - Follow Up Consult  Pharmacy Consult for Heparin Indication: pulmonary embolus and DVT  Allergies  Allergen Reactions   Adhesive [Tape] Other (See Comments)    After right leg fracture surgery, pt developed a large blister where tape was applied to his right leg. OK to use paper tape.   Imdur [Isosorbide Dinitrate] Other (See Comments)    hallucinations   Singulair [Montelukast Sodium] Other (See Comments)    Hallucinations     Patient Measurements: Height: '6\' 1"'$  (185.4 cm) Weight: 72 kg (158 lb 11.7 oz) IBW/kg (Calculated) : 79.9 Heparin Dosing Weight: 72.3 kg  Vital Signs: Temp: 98.6 F (37 C) (02/08 1202) Temp Source: Oral (02/08 1202) BP: 94/59 (02/08 1430) Pulse Rate: 96 (02/08 1430)  Labs: Recent Labs    04/11/22 0622 04/11/22 1514 04/11/22 2159 04/12/22 0021 04/13/22 0014  HGB 12.0*  --   --  11.7* 11.5*  HCT 35.3*  --   --  35.7* 33.1*  PLT 167  --   --  168 168  APTT 138*  --   --   --   --   HEPARINUNFRC 1.00*   < > 0.59 0.56 0.33  CREATININE  --   --   --  0.96 1.11   < > = values in this interval not displayed.     Estimated Creatinine Clearance: 59.5 mL/min (by C-G formula based on SCr of 1.11 mg/dL).  Assessment: 73 yom presenting 04/09/22 with near syncope and SOB. Heparin per pharmacy consult placed for pulmonary embolus. Extensive bilateral PE with evidence of right heart strain. S/p mechanical thrombectomy 2/5.  Also with bilateral DVT per duplex.Was on Xarelto prior to admission for prior PE, but noted to have missed several doses PTA due to nausea.  Noted plan to change to Eliquis when back to oral anticoagulation.  Heparin level remains therapeutic (0.33) on 1200 units/hr. CBC stable. S/p cath with non-obstructive CAD EF 25%  Post cath start apixaban  - '10mg'$  BID x1 week then '5mg'$  BID Changed to apixaban instead of PTA rivaroxaban - 1- patient misses doses because supposed to take with food and irregular diet as  well as apixaban $0 copay   Patient agreeable   Goal of Therapy:  Heparin level 0.3-0.7 units/ml Monitor platelets by anticoagulation protocol: Yes   Plan:  Stop heparin post cath  Apixaban '10mg'$  BID x7 days > followed but '5mg'$  BID  Bonnita Nasuti Pharm.D. CPP, BCPS Clinical Pharmacist [phone] 04/13/2022 2:58 PM

## 2022-04-13 NOTE — H&P (View-Only) (Signed)
Advanced Heart Failure Rounding Note  PCP-Cardiologist: Neoma Laming, MD   Subjective:    On heparin gtt. Remains on 4L [place]. BP stable. Breathing improving. Awaiting RHC/LHC.    Objective:   Weight Range: 72 kg Body mass index is 20.94 kg/m.   Vital Signs:   Temp:  [97.7 F (36.5 C)-98.8 F (37.1 C)] 98.7 F (37.1 C) (02/08 0736) Pulse Rate:  [86-94] 91 (02/08 0736) Resp:  [15-20] 20 (02/08 0736) BP: (99-134)/(61-88) 125/88 (02/08 0736) SpO2:  [90 %-95 %] 95 % (02/08 0736) Weight:  [72 kg] 72 kg (02/08 0349) Last BM Date : 04/11/22  Weight change: Filed Weights   04/11/22 2100 04/12/22 0500 04/13/22 0349  Weight: 72.3 kg 72.3 kg 72 kg    Intake/Output:   Intake/Output Summary (Last 24 hours) at 04/13/2022 0756 Last data filed at 04/13/2022 0240 Gross per 24 hour  Intake 683.69 ml  Output 1150 ml  Net -466.31 ml      Physical Exam    General:  elderly male sitting up on side of bed. No resp difficulty HEENT: Normal Neck: Supple. JVD not elevated . Carotids 2+ bilat; no bruits. No lymphadenopathy or thyromegaly appreciated. Cor: PMI nondisplaced. Regular rate & rhythm. No rubs, gallops or murmurs. Lungs: Clear Abdomen: Soft, nontender, nondistended. No hepatosplenomegaly. No bruits or masses. Good bowel sounds. Extremities: No cyanosis, clubbing, rash, edema Neuro: Alert & orientedx3, cranial nerves grossly intact. moves all 4 extremities w/o difficulty. Affect pleasant   Telemetry   NSR 90s   EKG    N/A   Labs    CBC Recent Labs    04/12/22 0021 04/13/22 0014  WBC 10.6* 7.4  HGB 11.7* 11.5*  HCT 35.7* 33.1*  MCV 94.2 90.7  PLT 168 812   Basic Metabolic Panel Recent Labs    04/12/22 0021 04/13/22 0014  NA 138 135  K 3.8 3.5  CL 108 106  CO2 21* 22  GLUCOSE 95 102*  BUN 18 17  CREATININE 0.96 1.11  CALCIUM 8.5* 8.5*  MG  --  2.0   Liver Function Tests No results for input(s): "AST", "ALT", "ALKPHOS", "BILITOT", "PROT",  "ALBUMIN" in the last 72 hours.  No results for input(s): "LIPASE", "AMYLASE" in the last 72 hours. Cardiac Enzymes No results for input(s): "CKTOTAL", "CKMB", "CKMBINDEX", "TROPONINI" in the last 72 hours.  BNP: BNP (last 3 results) Recent Labs    04/09/22 1645 04/10/22 0901  BNP 537.6* 1,012.6*    ProBNP (last 3 results) No results for input(s): "PROBNP" in the last 8760 hours.   D-Dimer No results for input(s): "DDIMER" in the last 72 hours. Hemoglobin A1C No results for input(s): "HGBA1C" in the last 72 hours. Fasting Lipid Panel No results for input(s): "CHOL", "HDL", "LDLCALC", "TRIG", "CHOLHDL", "LDLDIRECT" in the last 72 hours. Thyroid Function Tests No results for input(s): "TSH", "T4TOTAL", "T3FREE", "THYROIDAB" in the last 72 hours.  Invalid input(s): "FREET3"  Other results:   Imaging    DG HIP UNILAT WITH PELVIS 2-3 VIEWS LEFT  Result Date: 04/12/2022 CLINICAL DATA:  Left hip pain after fall 2 months ago at home. EXAM: DG HIP (WITH OR WITHOUT PELVIS) 2-3V LEFT COMPARISON:  None Available. FINDINGS: KUB 05/25/2016 IMPRESSION: No fracture or dislocation. Mild degenerative changes. The left femoroacetabular joint space is maintained. Mild superolateral left acetabular and mild lateral femoral head-neck junction degenerative osteophytosis. No acute fracture or dislocation. Mild right femoroacetabular joint space narrowing. Mild right L4-5 disc space narrowing. New brachytherapy  seeds overlie the prostate. Mild vascular calcifications. Electronically Signed   By: Yvonne Kendall M.D.   On: 04/12/2022 13:56     Medications:     Scheduled Medications:  amiodarone  200 mg Oral Daily   aspirin  81 mg Oral Pre-Cath   digoxin  0.125 mg Oral Daily   pantoprazole  40 mg Oral Daily   rOPINIRole  1 mg Oral QHS   rosuvastatin  10 mg Oral Daily   sacubitril-valsartan  1 tablet Oral BID   sodium chloride flush  3 mL Intravenous Q12H   spironolactone  25 mg Oral Daily    tamsulosin  0.4 mg Oral QPC supper   umeclidinium-vilanterol  1 puff Inhalation Daily    Infusions:  sodium chloride     heparin 1,200 Units/hr (04/13/22 0317)    PRN Medications: acetaminophen, albuterol, ALPRAZolam, docusate sodium, mouth rinse, polyethylene glycol    Patient Profile   77 y/o homeless male with COPD ad ongoing tobacco use, CAD, systolic HF EF 22-02%, colon CA previous lung CA. 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 Also with a/c LE DVT.  EF also found to be down to 20-25%. RV severely reduced.   Assessment/Plan   1. 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  - S/P Mechanical Thrombectomy R Pulmonary Artery.  - On heparin drip. Plan switch to elqiuis post LHC  - Will need eventual VQ scan (can do outpatient in several months)   2. Acute Hypoxic Respiratory Failure, COPD -On 4 liters. Stable sats.    3. PAF  -Maintaining ST  -On amiodarone. ? if nausea is related to amiodarone. If nausea persist, consider stopping amiodarone. Looking back at his EKGs he has been in SR for several years.   -On heparin drip. Anticipate switch to eliquis.    4. Chronic HFrEF  -Echo EF down 20-25% with D shaped septum RV Failure, EF previously 40-45%.  -Volume status stable. Does not need diuretics.  - Consider bisoprolol in next day or so.  - Continue entresto, spiro, and digoxin.  - Hold off on farxiga.  - given drop in EF, plan LHC today to exclude obstructive CAD    5. Lung nodule - Needs follow up CT in 3 months. Has been arranged w/ [facility]    6. Smoker  - Discussed smoking cessation.    7. [place]  - FULL Code    8. Social  - No permanent residence. [place] sur[place]. SW team assisting w/ finding income based housing options  - Needs to be followed in the community. Will need Paramedicine. I will refer.    Length of Stay: [address]  [place]  04/13/2022, 7:56 AM  Advanced Heart Failure Team Pager [phone] (M-F; 7a - 5p)  Please contact [facility] for night-coverage after hours (5p -7a ) and weekends on [email or web address]  Patient seen and examined with the above-signed Advanced Practice Provider and/or Housestaff. I personally reviewed laboratory data, imaging studies and relevant notes. I independently examined the patient and formulated the important aspects of the plan. I have edited the note to reflect any of my changes or salient points. I have personally discussed the plan with the patient and/or family.  Breathing better today. Denies CP. On heparin. No bleeding  General: Sitting up in chair . No resp difficulty HEENT: normal Neck: supple. JVP 8. Carotids 2+ bilat; no bruits. No  lymphadenopathy or thryomegaly appreciated. Cor: PMI nondisplaced. Regular rate & rhythm. No rubs, gallops or murmurs. Lungs: decreased throughotu Abdomen: soft, nontender, nondistended. No hepatosplenomegaly. No bruits or masses. Good bowel sounds. Extremities: no cyanosis, clubbing, rash, edema Neuro: alert & orientedx3, cranial nerves grossly intact. moves all 4 extremities w/o difficulty. Affect pleasant  Breathing improved. For R/L cath today to further evaluate for [facility] and CAD. Switch to Eliquis after cath. 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   Glori Bickers, MD  9:59 AM

## 2022-04-13 NOTE — TOC CM/SW Note (Signed)
HF TOC CM spoke to [place], Erasmo Downer and they will pick up cane. PT recommending RW and bsc. Pt scheduled to go to SNF rehab. Pt referred to HF Paramedicine. He will be working on permanent housing.  [place], Heart Failure TOC CM [phone]

## 2022-04-13 NOTE — Progress Notes (Signed)
PROGRESS NOTE    Bruce [name]  OZD:[id number] DOB: [birth year] DOA: 04/09/2022 PCP: Danelle Berry, NP   Brief Narrative:  77 year old male with history of paroxysmal A-fib, CAD/MI, chronic systolic heart failure, COPD, hypertension, prostate cancer, colon cancer, DVT 5 years ago, tobacco abuse presented with chest pain and dizziness.  Workup in the ED revealed submassive PE with right heart strain.  He was admitted to ICU under PCCM service and started on heparin drip.  He underwent IR thrombectomy on 04/10/2022.  Echo showed EF of 20 to 25% with global hypokinesis.  Heart failure team was consulted.  He was transferred to [facility] service from 04/12/2022 onwards.  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  Started on heparin drip and admitted to ICU under PCCM service.  Underwent IR thrombectomy of right pulmonary artery on 04/10/2022. -Transferred to [facility] service from 04/12/2022 onwards -Will possibly need lifelong anticoagulation: Currently on heparin drip.  Apparently, noncompliant with Xarelto as an outpatient.  Will switch to possibly oral Eliquis once okay with cardiology/heart failure team.  Patient refuses Coumadin -Currently on supplemental oxygen via nasal cannula intermittently.  Wean off as able  Acute on chronic systolic heart failure -Echo shows EF of 20 to 25%.  Previous EF of 40 to 45%. -Currently compensated.  Strict input and output.  Daily weights.  Fluid restriction --heart failure team following: Currently Entresto, spironolactone and digoxin.  Possible cath today.  Paroxysmal A-fib -Currently mostly rate controlled.  Continue digoxin, amiodarone.  Currently on heparin drip  COPD -Currently stable.  Continue current inhaled  regimen.  Left upper lobe lung nodule -Enlarging left upper lobe lung nodule.  Needs follow-up CT in 3 months and outpatient pulmonary follow-up  BPH -Continue tamsulosin  Restless leg syndrome -Continue Requip  Left hip pain Ambulatory dysfunction -Apparently fell few weeks ago on the left hip and has been having left hip pain.  No fractures noted on x-rays. -PT recommended SNF placement.  TOC consulted.  GERD -Continue Protonix  History of prostate cancer -Outpatient follow-up with urology  Anxiety -Continue as needed Xanax  DVT prophylaxis: Heparin drip Code Status: Full Family Communication: None at bedside Disposition Plan: Status is: Inpatient Remains inpatient appropriate because: Of severity of illness.  Consultants: PCCM/IR/cardiology  Procedures: As above  Antimicrobials: None   Subjective: Patient seen and examined at bedside.  Denies current chest pain, fever, vomiting.  Breathing feels better. Objective: Vitals:   04/12/22 1929 04/12/22 2303 04/13/22 0339 04/13/22 0349  BP: 107/69 99/63 106/61   Pulse: 90 86 88   Resp: '17 19 20   '$ Temp: 98.8 F (37.1 C) 97.7 F (36.5 C) 98.6 F (37 C)   TempSrc: Oral Oral Oral   SpO2: 93% 92% 91%   Weight:    72 kg  Height:        Intake/Output Summary (Last 24 hours) at 04/13/2022 0728 Last data filed at 04/13/2022 0240 Gross per 24 hour  Intake 803.69 ml  Output 1150 ml  Net -346.31 ml    Filed Weights   04/11/22 2100 04/12/22 0500 04/13/22 0349  Weight: 72.3 kg 72.3 kg 72 kg    Examination:  General: No distress.  Intermittently requiring supplemental oxygen by nasal cannula.  ENT/neck: No thyromegaly.  JVD is not elevated  respiratory: Decreased breath sounds at bases bilaterally with some crackles; no wheezing  CVS: S1-S2 heard, rate controlled currently Abdominal: Soft, nontender, slightly distended; no organomegaly, bowel sounds are heard Extremities: Trace lower extremity edema; no cyanosis   CNS: Awake and alert.  Slow to respond.  Poor historian.  No focal neurologic deficit.  Moves extremities Lymph: No obvious lymphadenopathy Skin: No obvious ecchymosis/lesions  psych: Currently not agitated.  Affect is flat.   Musculoskeletal: No obvious joint swelling/deformity    Data Reviewed: I have personally reviewed following labs and imaging studies  CBC: Recent Labs  Lab 04/09/22 1645 04/09/22 1805 04/10/22 0358 04/11/22 0622 04/12/22 0021 04/13/22 0014  WBC 8.8  --  7.4 11.1* 10.6* 7.4  NEUTROABS 7.4  --   --   --   --   --   HGB 13.8 11.2* 13.3 12.0* 11.7* 11.5*  HCT 41.8 33.0* 39.4 35.3* 35.7* 33.1*  MCV 93.9  --  91.8 92.9 94.2 90.7  PLT 162  --  154 167 168 628    Basic Metabolic Panel: Recent Labs  Lab 04/09/22 1645 04/09/22 1805 04/10/22 0358 04/10/22 0901 04/12/22 0021 04/13/22 0014  NA 142 145 140 135 138 135  K 3.5 2.8* 4.8 4.4 3.8 3.5  CL 112*  --  110 108 108 106  CO2 18*  --  19* 16* 21* 22  GLUCOSE 133*  --  206* 164* 95 102*  BUN 12  --  '15 17 18 17  '$ CREATININE 1.07  --  0.97 0.98 0.96 1.11  CALCIUM 8.8*  --  9.6 9.4 8.5* 8.5*  MG 1.8  --   --   --   --  2.0    GFR: Estimated Creatinine Clearance: 59.5 mL/min (by C-G formula based on SCr of 1.11 mg/dL). Liver Function Tests: Recent Labs  Lab 04/09/22 1645  AST 31  ALT 23  ALKPHOS 83  BILITOT 1.7*  PROT 6.4*  ALBUMIN 3.4*    No results for input(s): "LIPASE", "AMYLASE" in the last 168 hours. No results for input(s): "AMMONIA" in the last 168 hours. Coagulation Profile: Recent Labs  Lab 04/09/22 1645  INR 1.4*    Cardiac Enzymes: No results for input(s): "CKTOTAL", "CKMB", "CKMBINDEX", "TROPONINI" in the last 168 hours. BNP (last 3 results) No results for input(s): "PROBNP" in the last 8760 hours. HbA1C: No results for input(s): "HGBA1C" in the last 72 hours. CBG: No results for input(s): "GLUCAP" in the last 168 hours. Lipid Profile: No results for input(s):  "CHOL", "HDL", "LDLCALC", "TRIG", "CHOLHDL", "LDLDIRECT" in the last 72 hours. Thyroid Function Tests: No results for input(s): "TSH", "T4TOTAL", "FREET4", "T3FREE", "THYROIDAB" in the last 72 hours. Anemia Panel: No results for input(s): "VITAMINB12", "FOLATE", "FERRITIN", "TIBC", "IRON", "RETICCTPCT" in the last 72 hours. Sepsis Labs: Recent Labs  Lab 04/09/22 2200 04/10/22 0032 04/10/22 0901  LATICACIDVEN 5.4* 3.5* 2.4*     Recent Results (from the past 240 hour(s))  Resp panel by RT-PCR (RSV, Flu A&B, Covid) Anterior Nasal Swab     Status: None   Collection Time: 04/09/22  4:57 PM   Specimen: Anterior Nasal Swab  Result Value Ref Range Status   SARS Coronavirus 2 by RT PCR NEGATIVE NEGATIVE Final   Influenza A by PCR NEGATIVE NEGATIVE Final   Influenza B by PCR NEGATIVE NEGATIVE Final    Comment: (NOTE) The Xpert Xpress SARS-CoV-2/FLU/RSV plus assay is intended as an aid in the diagnosis of influenza from Nasopharyngeal swab specimens and should  not be used as a sole basis for treatment. Nasal washings and aspirates are unacceptable for Xpert Xpress SARS-CoV-2/FLU/RSV testing.  Fact Sheet for Patients: EntrepreneurPulse.com.au  Fact Sheet for Healthcare Providers: IncredibleEmployment.be  This test is not yet approved or cleared by the [place] FDA and has been authorized for detection and/or diagnosis of SARS-CoV-2 by FDA under an Emergency Use Authorization (EUA). This EUA will remain in effect (meaning this test can be used) for the duration of the COVID-19 declaration under Section 564(b)(1) of the Act, 21 U.S.C. section 360bbb-3(b)(1), unless the authorization is terminated or revoked.     Resp Syncytial Virus by PCR NEGATIVE NEGATIVE Final    Comment: (NOTE) Fact Sheet for Patients: EntrepreneurPulse.com.au  Fact Sheet for Healthcare Providers: IncredibleEmployment.be  This test  is not yet approved or cleared by the [place] FDA and has been authorized for detection and/or diagnosis of SARS-CoV-2 by FDA under an Emergency Use Authorization (EUA). This EUA will remain in effect (meaning this test can be used) for the duration of the COVID-19 declaration under Section 564(b)(1) of the Act, 21 U.S.C. section 360bbb-3(b)(1), unless the authorization is terminated or revoked.  Performed at [facility], [place] [address], [place], [place] [address]   MRSA Next Gen by PCR, Nasal     Status: None   Collection Time: 04/10/22  1:09 AM   Specimen: Nasal Mucosa; Nasal Swab  Result Value Ref Range Status   MRSA by PCR Next Gen NOT DETECTED NOT DETECTED Final    Comment: (NOTE) The GeneXpert MRSA Assay (FDA approved for NASAL specimens only), is one component of a comprehensive MRSA colonization surveillance program. It is not intended to diagnose MRSA infection nor to guide or monitor treatment for MRSA infections. Test performance is not FDA approved in patients less than 75 years old. Performed at [facility], [place] [address], [place], [place] [address]          Radiology Studies: DG HIP UNILAT WITH PELVIS 2-3 VIEWS LEFT  Result Date: 04/12/2022 CLINICAL DATA:  Left hip pain after fall 2 months ago at home. EXAM: DG HIP (WITH OR WITHOUT PELVIS) 2-3V LEFT COMPARISON:  None Available. FINDINGS: KUB 05/25/2016 IMPRESSION: No fracture or dislocation. Mild degenerative changes. The left femoroacetabular joint space is maintained. Mild superolateral left acetabular and mild lateral femoral head-neck junction degenerative osteophytosis. No acute fracture or dislocation. Mild right femoroacetabular joint space narrowing. Mild right L4-5 disc space narrowing. New brachytherapy seeds overlie the prostate. Mild vascular calcifications. Electronically Signed   By: Yvonne Kendall M.D.   On: 04/12/2022 13:56        Scheduled Meds:  amiodarone  200 mg Oral Daily    digoxin  0.125 mg Oral Daily   pantoprazole  40 mg Oral Daily   rOPINIRole  1 mg Oral QHS   rosuvastatin  10 mg Oral Daily   sacubitril-valsartan  1 tablet Oral BID   sodium chloride flush  3 mL Intravenous Q12H   spironolactone  25 mg Oral Daily   tamsulosin  0.4 mg Oral QPC supper   umeclidinium-vilanterol  1 puff Inhalation Daily   Continuous Infusions:  heparin 1,200 Units/hr (04/13/22 0317)          Aline August, MD [facility] 04/13/2022, 7:28 AM

## 2022-04-14 ENCOUNTER — Encounter (HOSPITAL_COMMUNITY): Payer: Self-pay | Admitting: Internal Medicine

## 2022-04-14 ENCOUNTER — Other Ambulatory Visit (HOSPITAL_COMMUNITY): Payer: Self-pay

## 2022-04-14 ENCOUNTER — Telehealth (HOSPITAL_COMMUNITY): Payer: Self-pay | Admitting: Pharmacy Technician

## 2022-04-14 DIAGNOSIS — I2609 Other pulmonary embolism with acute cor pulmonale: Secondary | ICD-10-CM | POA: Diagnosis not present

## 2022-04-14 DIAGNOSIS — I272 Pulmonary hypertension, unspecified: Secondary | ICD-10-CM

## 2022-04-14 DIAGNOSIS — I5021 Acute systolic (congestive) heart failure: Secondary | ICD-10-CM | POA: Diagnosis not present

## 2022-04-14 DIAGNOSIS — J9601 Acute respiratory failure with hypoxia: Secondary | ICD-10-CM | POA: Diagnosis not present

## 2022-04-14 LAB — CBC
HCT: 34.6 % — ABNORMAL LOW (ref 39.0–52.0)
Hemoglobin: 11.5 g/dL — ABNORMAL LOW (ref 13.0–17.0)
MCH: 30.6 pg (ref 26.0–34.0)
MCHC: 33.2 g/dL (ref 30.0–36.0)
MCV: 92 fL (ref 80.0–100.0)
Platelets: 173 10*3/uL (ref 150–400)
RBC: 3.76 MIL/uL — ABNORMAL LOW (ref 4.22–5.81)
RDW: 13.8 % (ref 11.5–15.5)
WBC: 5.7 10*3/uL (ref 4.0–10.5)
nRBC: 0 % (ref 0.0–0.2)

## 2022-04-14 LAB — BASIC METABOLIC PANEL
Anion gap: 9 (ref 5–15)
BUN: 15 mg/dL (ref 8–23)
CO2: 22 mmol/L (ref 22–32)
Calcium: 8.5 mg/dL — ABNORMAL LOW (ref 8.9–10.3)
Chloride: 103 mmol/L (ref 98–111)
Creatinine, Ser: 1.03 mg/dL (ref 0.61–1.24)
GFR, Estimated: >60 mL/min (ref >60)
Glucose, Bld: 118 mg/dL — ABNORMAL HIGH (ref 70–99)
Potassium: 3.5 mmol/L (ref 3.5–5.1)
Sodium: 134 mmol/L — ABNORMAL LOW (ref 135–145)

## 2022-04-14 LAB — HEPARIN LEVEL (UNFRACTIONATED): Heparin Unfractionated: >1.1 IU/mL — ABNORMAL HIGH (ref 0.30–0.70)

## 2022-04-14 LAB — MAGNESIUM: Magnesium: 2.1 mg/dL (ref 1.7–2.4)

## 2022-04-14 MED ORDER — POTASSIUM CHLORIDE CRYS ER 20 MEQ PO TBCR
40.0000 meq | EXTENDED_RELEASE_TABLET | Freq: Once | ORAL | Status: AC
  Administered 2022-04-14: 40 meq via ORAL
  Filled 2022-04-14: qty 2

## 2022-04-14 MED ORDER — DAPAGLIFLOZIN PROPANEDIOL 10 MG PO TABS
10.0000 mg | ORAL_TABLET | Freq: Every day | ORAL | Status: DC
  Administered 2022-04-14 – 2022-04-15 (×2): 10 mg via ORAL
  Filled 2022-04-14 (×2): qty 1

## 2022-04-14 MED ORDER — SILDENAFIL CITRATE 20 MG PO TABS
20.0000 mg | ORAL_TABLET | Freq: Three times a day (TID) | ORAL | Status: DC
  Administered 2022-04-14 (×3): 20 mg via ORAL
  Filled 2022-04-14 (×6): qty 1

## 2022-04-14 NOTE — Plan of Care (Signed)

## 2022-04-14 NOTE — Telephone Encounter (Signed)
Patient Advocate Encounter   Received notification that prior authorization for Sildenafil Citrate 20MG tablets is required.   PA submitted on 04/14/2022 Key [id number] Status is pending       Lyndel Safe, [place] Patient Advocate Specialist [place] Patient Advocate Team Direct Number: [phone]  Fax: [phone]

## 2022-04-14 NOTE — TOC Benefit Eligibility Note (Signed)
Patient Teacher, English as a foreign language completed.    The patient is currently admitted and upon discharge could be taking Sildenafil Citrate 20MG tablets.  Requires Prior Authorization  The patient is insured through [place], [place] Patient Advocate Specialist [place] Patient Advocate Team Direct Number: [phone]  Fax: [phone]

## 2022-04-14 NOTE — TOC Progression Note (Signed)
Transition of Care [facility]) - Progression Note    Patient Details  Name: Bruce [name] MRN: [id number] Date of Birth: [birth year]  Transition of Care [facility]) CM/SW [place], [place] Phone Number: 04/14/2022, 10:12 AM  Clinical Narrative:    [name] notified CSW that they would have a bed available for pt Monday 2/12. MD notified CSW that if pt oxygen requirements improve, he will be able to dc to SNF on Monday.   TOC will continue follow.    Expected Discharge Plan: [place] Barriers to Discharge: Continued Medical Work up  Expected Discharge Plan and Services   Discharge Planning Services: CM Consult   Living arrangements for the past 2 months: Mobile Home                                       Social Determinants of Health (SDOH) Interventions SDOH Screenings   Food Insecurity: No Food Insecurity (04/12/2022)  Housing: High Risk (04/12/2022)  Transportation Needs: No Transportation Needs (04/12/2022)  Utilities: Not At Risk (04/14/2022)  Financial Resource Strain: High Risk (04/12/2022)  Physical Activity: Inactive (02/05/2017)  Social Connections: Moderately Isolated (02/05/2017)  Stress: Stress Concern Present (02/05/2017)  Tobacco Use: High Risk (04/14/2022)    Readmission Risk Interventions     No data to display         Beckey Rutter, MSW, LCSWA, LCASA Transitions of Care  Clinical Social Worker I

## 2022-04-14 NOTE — Progress Notes (Signed)
PROGRESS NOTE    Bruce [name]  [id number] DOB: 1945/12/17 DOA: 04/09/2022 PCP: Danelle Berry, NP   Brief Narrative:  77 year old male with history of paroxysmal A-fib, CAD/MI, chronic systolic heart failure, COPD, hypertension, prostate cancer, colon cancer, DVT 5 years ago, tobacco abuse presented with chest pain and dizziness.  Workup in the ED revealed submassive PE with right heart strain.  He was admitted to ICU under PCCM service and started on heparin drip.  He underwent IR thrombectomy on 04/10/2022.  Echo showed EF of 20 to 25% with global hypokinesis.  Heart failure team was consulted.  He was transferred to [facility] service from 04/12/2022 onwards.  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  Started on heparin drip and admitted to ICU under PCCM service.  Underwent IR thrombectomy of right pulmonary artery on 04/10/2022. -Transferred to [facility] service from 04/12/2022 onwards -Will possibly need lifelong anticoagulation: Transitioned to Eliquis from heparin drip on 04/13/2022 -Currently on 6 L oxygen via hyponasal cannula.  Wean off as able  Acute on chronic systolic heart failure Minimal CAD Moderate to severe PAH -Echo shows EF of 20 to 25%.  Previous EF of 40 to 45%. -Currently compensated.  Strict input and output.  Daily weights.  Fluid restriction --heart failure team following: Currently Entresto, spironolactone and digoxin.  Status post left and right heart cath on 04/13/2022.  Patient has been started on sildenafil by heart failure team.  Paroxysmal A-fib -Currently mostly rate controlled.  Continue digoxin, amiodarone.  Currently on heparin drip  Hyponatremia -Mild.   Monitor  COPD -Currently stable.  Continue current inhaled regimen.  Left upper lobe lung nodule -Enlarging left upper lobe lung nodule.  Needs follow-up CT in 3 months and outpatient pulmonary follow-up  BPH -Continue tamsulosin  Restless leg syndrome -Continue Requip  Left hip pain Ambulatory dysfunction -Apparently fell few weeks ago on the left hip and has been having left hip pain.  No fractures noted on x-rays. -PT recommended SNF placement.  TOC consulted.  GERD -Continue Protonix  History of prostate cancer -Outpatient follow-up with urology  Anxiety -Continue as needed Xanax  DVT prophylaxis: Eliquis Code Status: Full Family Communication: None at bedside Disposition Plan: Status is: Inpatient Remains inpatient appropriate because: Of severity of illness.  Need for SNF placement  Consultants: PCCM/IR/cardiology  Procedures: As above  Antimicrobials: None   Subjective: Patient seen and examined at bedside.  Still short of breath with exertion intermittent intermittent after pain.  Denies any chest pain, vomiting  objective: Vitals:   04/14/22 0255 04/14/22 0602 04/14/22 0808 04/14/22 0851  BP: 101/64  111/76   Pulse: 89  83   Resp: 16  20 20  $ Temp: 98.1 F (36.7 C)  98.1 F (36.7 C)   TempSrc: Oral  Oral   SpO2: 96%  96%   Weight:  70.7 kg    Height:        Intake/Output Summary (Last 24 hours) at 04/14/2022 0939 Last data filed at 04/14/2022 0900 Gross per 24 hour  Intake 300 ml  Output 1050 ml  Net -750 ml    Filed Weights   04/12/22 0500 04/13/22 0349 04/14/22 0602  Weight: 72.3 kg 72 kg 70.7 kg  Examination:  General: On 6 L oxygen by hyponatremia.  No acute distress.  Chronically ill and deconditioned ENT/neck: No JVD elevation or palpable neck masses noted  respiratory: Bilateral decreased air sounds at bases basilar crackles  CVS: Rate mostly controlled; S1 and S2 heard Abdominal: Soft, nontender, distended mildly; no  organomegaly, normal bowel sounds heard  extremities: No clubbing; mild lower extremity edema present  CNS: Alert and oriented.  Still slow to respond.  Poor historian.  No focal neurologic deficit.  Able to move extremities  lymph: No obvious palpable lymphadenopathy Skin: No obvious petechiae/rashes psych: Flat affect.  Not agitated currently  musculoskeletal: No obvious joint swelling/deformity    Data Reviewed: I have personally reviewed following labs and imaging studies  CBC: Recent Labs  Lab 04/09/22 1645 04/09/22 1805 04/10/22 0358 04/11/22 0622 04/12/22 0021 04/13/22 0014 04/13/22 1118 04/13/22 1125 04/14/22 0019  WBC 8.8  --  7.4 11.1* 10.6* 7.4  --   --  5.7  NEUTROABS 7.4  --   --   --   --   --   --   --   --   HGB 13.8   < > 13.3 12.0* 11.7* 11.5* 10.5* 11.6*  11.9* 11.5*  HCT 41.8   < > 39.4 35.3* 35.7* 33.1* 31.0* 34.0*  35.0* 34.6*  MCV 93.9  --  91.8 92.9 94.2 90.7  --   --  92.0  PLT 162  --  154 167 168 168  --   --  173   < > = values in this interval not displayed.    Basic Metabolic Panel: Recent Labs  Lab 04/09/22 1645 04/09/22 1805 04/10/22 0358 04/10/22 0901 04/12/22 0021 04/13/22 0014 04/13/22 1118 04/13/22 1125 04/14/22 0019  NA 142   < > 140 135 138 135 144 141  140 134*  K 3.5   < > 4.8 4.4 3.8 3.5 3.6 4.0  4.1 3.5  CL 112*  --  110 108 108 106  --   --  103  CO2 18*  --  19* 16* 21* 22  --   --  22  GLUCOSE 133*  --  206* 164* 95 102*  --   --  118*  BUN 12  --  15 17 18 17  $ --   --  15  CREATININE 1.07  --  0.97 0.98 0.96 1.11  --   --  1.03  CALCIUM 8.8*  --  9.6 9.4 8.5* 8.5*  --   --  8.5*  MG 1.8  --   --   --   --  2.0  --   --  2.1   < > = values in this interval not displayed.    GFR: Estimated Creatinine Clearance: 62.9 mL/min (by C-G formula based on SCr of 1.03 mg/dL). Liver Function Tests: Recent Labs  Lab 04/09/22 1645  AST 31  ALT 23  ALKPHOS 83  BILITOT 1.7*  PROT 6.4*  ALBUMIN 3.4*    No results  for input(s): "LIPASE", "AMYLASE" in the last 168 hours. No results for input(s): "AMMONIA" in the last 168 hours. Coagulation Profile: Recent Labs  Lab 04/09/22 1645  INR 1.4*    Cardiac Enzymes: No results for input(s): "CKTOTAL", "CKMB", "CKMBINDEX", "TROPONINI" in the last 168 hours. BNP (last 3 results) No results for input(s): "PROBNP" in the last 8760 hours. HbA1C: No results for input(s): "HGBA1C" in the last 72 hours. CBG: No results for input(s): "GLUCAP" in the  last 168 hours. Lipid Profile: No results for input(s): "CHOL", "HDL", "LDLCALC", "TRIG", "CHOLHDL", "LDLDIRECT" in the last 72 hours. Thyroid Function Tests: No results for input(s): "TSH", "T4TOTAL", "FREET4", "T3FREE", "THYROIDAB" in the last 72 hours. Anemia Panel: No results for input(s): "VITAMINB12", "FOLATE", "FERRITIN", "TIBC", "IRON", "RETICCTPCT" in the last 72 hours. Sepsis Labs: Recent Labs  Lab 04/09/22 2200 04/10/22 0032 04/10/22 0901  LATICACIDVEN 5.4* 3.5* 2.4*     Recent Results (from the past 240 hour(s))  Resp panel by RT-PCR (RSV, Flu A&B, Covid) Anterior Nasal Swab     Status: None   Collection Time: 04/09/22  4:57 PM   Specimen: Anterior Nasal Swab  Result Value Ref Range Status   SARS Coronavirus 2 by RT PCR NEGATIVE NEGATIVE Final   Influenza A by PCR NEGATIVE NEGATIVE Final   Influenza B by PCR NEGATIVE NEGATIVE Final    Comment: (NOTE) The Xpert Xpress SARS-CoV-2/FLU/RSV plus assay is intended as an aid in the diagnosis of influenza from Nasopharyngeal swab specimens and should not be used as a sole basis for treatment. Nasal washings and aspirates are unacceptable for Xpert Xpress SARS-CoV-2/FLU/RSV testing.  Fact Sheet for Patients: EntrepreneurPulse.com.au  Fact Sheet for Healthcare Providers: IncredibleEmployment.be  This test is not yet approved or cleared by the [place] FDA and has been authorized for detection and/or  diagnosis of SARS-CoV-2 by FDA under an Emergency Use Authorization (EUA). This EUA will remain in effect (meaning this test can be used) for the duration of the COVID-19 declaration under Section 564(b)(1) of the Act, 21 U.S.C. section 360bbb-3(b)(1), unless the authorization is terminated or revoked.     Resp Syncytial Virus by PCR NEGATIVE NEGATIVE Final    Comment: (NOTE) Fact Sheet for Patients: EntrepreneurPulse.com.au  Fact Sheet for Healthcare Providers: IncredibleEmployment.be  This test is not yet approved or cleared by the [place] FDA and has been authorized for detection and/or diagnosis of SARS-CoV-2 by FDA under an Emergency Use Authorization (EUA). This EUA will remain in effect (meaning this test can be used) for the duration of the COVID-19 declaration under Section 564(b)(1) of the Act, 21 U.S.C. section 360bbb-3(b)(1), unless the authorization is terminated or revoked.  Performed at [facility], [place] [address] [place], [place] [address]   MRSA Next Gen by PCR, Nasal     Status: None   Collection Time: 04/10/22  1:09 AM   Specimen: Nasal Mucosa; Nasal Swab  Result Value Ref Range Status   MRSA by PCR Next Gen NOT DETECTED NOT DETECTED Final    Comment: (NOTE) The GeneXpert MRSA Assay (FDA approved for NASAL specimens only), is one component of a comprehensive MRSA colonization surveillance program. It is not intended to diagnose MRSA infection nor to guide or monitor treatment for MRSA infections. Test performance is not FDA approved in patients less than 16 years old. Performed at [facility], [place] [address], [place], [place] [address]          Radiology Studies: CARDIAC CATHETERIZATION  Result Date: 04/13/2022   Prox RCA to Mid RCA lesion is 30% stenosed.   Ost LAD to Prox LAD lesion is 40% stenosed.   The left ventricular ejection fraction is less than 25% by visual estimate. Findings: Ao =  122/78 (96) LV = 123/6 RA = 4 RV = 77/8 LPA = 73/23 (39) RPA = 85/24 (44) PCW = 16 Fick cardiac output/index = 4.3/2.2 PVR = 5.0 Ao sat = 90% PA sat = 51%, 50%  Assessment: 1. Minimal CAD 2. 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 Start sildeanfil and Xarelto. Glori Bickers, MD 1:18 PM  DG HIP UNILAT WITH PELVIS 2-3 VIEWS LEFT  Result Date: 04/12/2022 CLINICAL DATA:  Left hip pain after fall 2 months ago at home. EXAM: DG HIP (WITH OR WITHOUT PELVIS) 2-3V LEFT COMPARISON:  None Available. FINDINGS: KUB 05/25/2016 IMPRESSION: No fracture or dislocation. Mild degenerative changes. The left femoroacetabular joint space is maintained. Mild superolateral left acetabular and mild lateral femoral head-neck junction degenerative osteophytosis. No acute fracture or dislocation. Mild right femoroacetabular joint space narrowing. Mild right L4-5 disc space narrowing. New brachytherapy seeds overlie the prostate. Mild vascular calcifications. Electronically Signed   By: Yvonne Kendall M.D.   On: 04/12/2022 13:56        Scheduled Meds:  amiodarone  200 mg Oral QHS   apixaban  10 mg Oral BID   [START ON 04/21/2022] apixaban  5 mg Oral BID   digoxin  0.125 mg Oral Daily   pantoprazole  40 mg Oral Daily   rOPINIRole  1 mg Oral QHS   rosuvastatin  10 mg Oral Daily   sacubitril-valsartan  1 tablet Oral BID   sildenafil  20 mg Oral TID   sodium chloride flush  3 mL Intravenous Q12H   sodium chloride flush  3 mL Intravenous Q12H   spironolactone  25 mg Oral Daily   umeclidinium-vilanterol  1 puff Inhalation Daily   Continuous Infusions:  sodium chloride            Aline August, MD [facility] 04/14/2022, 9:39 AM

## 2022-04-14 NOTE — Telephone Encounter (Signed)
Patient Advocate Encounter  Prior Authorization for Sildenafil Citrate 20MG tablets  has been approved.    PA# [id number] Effective dates: 04/14/2022 through 03/06/2023  Patients co-pay is $0.00.     Lyndel Safe, [place] Patient Advocate Specialist [place] Patient Advocate Team Direct Number: [phone]  Fax: [phone]

## 2022-04-14 NOTE — Progress Notes (Signed)
Advanced Heart Failure Rounding Note  PCP-Cardiologist: Neoma Laming, MD   Patient Profile   77 y/o homeless male with COPD ad ongoing tobacco use, CAD, systolic HF EF [id number], colon CA previous lung CA. 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 Also with a/c LE DVT.  EF also found to be down to 20-25%. RV severely reduced.  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  Transitioned from hepatin>>Eliquis.  Tolerating GDMT addition. BP/ SCr and K all stable.   Reports subjective improvement in breathing but c/w high amount of O2 support, currently on 8L [place], O2 sats 94%.    RHC 2/8 Findings:   Ao = 122/78 (96) LV = 123/6 RA = 4 RV = 77/8 LPA = 73/23 (39) RPA = 85/24 (44)  PCW = 16 Fick cardiac output/index = 4.3/2.2 PVR = 5.0  Ao sat = 90% PA sat = 51%, 50%  Objective:   Weight Range: 70.7 kg Body mass index is 20.56 kg/m.   Vital Signs:   Temp:  [97.8 F (36.6 C)-98.6 F (37 C)] 98.1 F (36.7 C) (02/09 0808) Pulse Rate:  [83-98] 83 (02/09 0808) Resp:  [14-31] 20 (02/09 0808) BP: (94-150)/(59-91) 111/76 (02/09 0808) SpO2:  [86 %-96 %] 96 % (02/09 0808) Weight:  [70.7 kg] 70.7 kg (02/09 0602) Last BM Date : 04/11/22  Weight change: Filed Weights   04/12/22 0500 04/13/22 0349 04/14/22 0602  Weight: 72.3 kg 72 kg 70.7 kg    Intake/Output:   Intake/Output Summary (Last 24 hours) at 04/14/2022 0841 Last data filed at 04/14/2022 0557 Gross per 24 hour  Intake 60 ml  Output 1050 ml  Net -990 ml      Physical Exam    General:  Well appearing. No respiratory difficulty HEENT: normal Neck: supple. no JVD. Carotids 2+ bilat; no bruits. No lymphadenopathy or thyromegaly appreciated. Cor: PMI nondisplaced. Regular rate & rhythm. No rubs, gallops or murmurs. Lungs:  clear Abdomen: soft, nontender, nondistended. No hepatosplenomegaly. No bruits or masses. Good bowel sounds. Extremities: no cyanosis, clubbing, rash, edema Neuro: alert & oriented x 3, cranial nerves grossly intact. moves all 4 extremities w/o difficulty. Affect pleasant.    Telemetry   NSR 90s   EKG    N/A   Labs    CBC Recent Labs    04/13/22 0014 04/13/22 1118 04/13/22 1125 04/14/22 0019  WBC 7.4  --   --  5.7  HGB 11.5*   < > 11.6*  11.9* 11.5*  HCT 33.1*   < > 34.0*  35.0* 34.6*  MCV 90.7  --   --  92.0  PLT 168  --   --  173   < > = values in this interval not displayed.   Basic Metabolic Panel Recent Labs    04/13/22 0014 04/13/22 1118 04/13/22 1125 04/14/22 0019  NA 135   < > 141  140 134*  K 3.5   < > 4.0  4.1 3.5  CL 106  --   --  103  CO2 22  --   --  22  GLUCOSE 102*  --   --  118*  BUN 17  --   --  15  CREATININE 1.11  --   --  1.03  CALCIUM 8.5*  --   --  8.5*  MG 2.0  --   --  2.1   < > = values in this interval not displayed.   Liver Function Tests No results for input(s): "AST", "ALT", "ALKPHOS", "BILITOT", "PROT", "ALBUMIN" in the last 72 hours.  No results for input(s): "LIPASE", "AMYLASE" in the last 72 hours. Cardiac Enzymes No results for input(s): "CKTOTAL", "CKMB", "CKMBINDEX", "TROPONINI" in the last 72 hours.  BNP: BNP (last 3 results) Recent Labs    04/09/22 1645 04/10/22 0901  BNP 537.6* 1,012.6*    ProBNP (last 3 results) No results for input(s): "PROBNP" in the last 8760 hours.   D-Dimer No results for input(s): "DDIMER" in the last 72 hours. Hemoglobin A1C No results for input(s): "HGBA1C" in the last 72 hours. Fasting Lipid Panel No results for input(s): "CHOL", "HDL", "LDLCALC", "TRIG", "CHOLHDL", "LDLDIRECT" in the last 72 hours. Thyroid Function Tests No results for input(s): "TSH", "T4TOTAL", "T3FREE", "THYROIDAB" in the last 72 hours.  Invalid input(s): "FREET3"  Other results:   Imaging     CARDIAC CATHETERIZATION  Result Date: 04/13/2022   Prox RCA to Mid RCA lesion is 30% stenosed.   Ost LAD to Prox LAD lesion is 40% stenosed.   The left ventricular ejection fraction is less than 25% by visual estimate. Findings: Ao = 122/78 (96) LV = 123/6 RA = 4 RV = 77/8 LPA = 73/23 (39) RPA = 85/24 (44) PCW = 16 Fick cardiac output/index = 4.3/2.2 PVR = 5.0 Ao sat = 90% PA sat = 51%, 50% Assessment: 1. Minimal CAD 2. 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 Start sildeanfil and Xarelto. Glori Bickers, MD 1:18 PM    Medications:     Scheduled Medications:  amiodarone  200 mg Oral QHS   apixaban  10 mg Oral BID   [START ON 04/21/2022] apixaban  5 mg Oral BID   digoxin  0.125 mg Oral Daily   pantoprazole  40 mg Oral Daily   rOPINIRole  1 mg Oral QHS   rosuvastatin  10 mg Oral Daily   sacubitril-valsartan  1 tablet Oral BID   sodium chloride flush  3 mL Intravenous Q12H   sodium chloride flush  3 mL Intravenous Q12H   spironolactone  25 mg Oral Daily   tamsulosin  0.4 mg Oral QPC supper   umeclidinium-vilanterol  1 puff Inhalation Daily    Infusions:  sodium chloride      PRN Medications: sodium chloride, acetaminophen, albuterol, ALPRAZolam, docusate sodium, mouth rinse, polyethylene glycol, sodium chloride flush    Patient Profile   77 y/o homeless male with COPD ad ongoing tobacco use, CAD, systolic HF EF [id number], colon CA previous lung CA. 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 Also with a/c LE DVT.  EF also found to be down to 20-25%. RV severely reduced.   Assessment/Plan   1. 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  - S/P Mechanical Thrombectomy R Pulmonary Artery  - RHC w/ moderate to severe PAH with evidence of cor pulmonale  -  Continue Eliquis treatment dose  - start sildenafil 20 mg tid  - Will need eventual VQ scan (can do outpatient in several months)   2. Acute Hypoxic Respiratory Failure - COPD at b/l + submassive PE - continues w/  supp O2 requirements  - tx per above    3. PAF  -Maintaining ST  -On amiodarone. ? if nausea is related to amiodarone. If nausea persist, consider stopping amiodarone. Looking back at his EKGs he has been in SR for several years.   -Continue Eliquis    4. Chronic HFrEF  - Echo EF down 20-25% with D shaped septum RV Failure, EF previously 40-45%.  - R/LHC c/w severe NICM. Minimal CAD, moderate to severe PAH with evidence of cor pulmonale - Continue entresto, spiro, and digoxin.  - add sildenafil for PH/RV support  - consider farxiga soon    5. Lung nodule - Needs follow up CT in 3 months. Has been arranged w/ [facility]    6. Smoker  - Discussed smoking cessation.    7. [place]  - FULL Code    8. Social  - No permanent residence. [place]. SW team assisting w/ finding income based housing options  - Needs to be followed in the community. Will need Paramedicine. I will refer.    Length of Stay: [address] PA-C  04/14/2022, 8:41 AM  Advanced Heart Failure Team Pager [phone] (M-F; 7a - 5p)  Please contact [facility] for night-coverage after hours (5p -7a ) and weekends on [email or web address]

## 2022-04-14 NOTE — Progress Notes (Signed)
Physical Therapy Treatment Patient Details Name: Bruce [name] MRN: [id number] DOB: [birth year] Today's Date: 04/14/2022   History of Present Illness Pt is a 77 y.o. male who presented 04/09/22 with SOB and chest pain. Pt found to have bil PE with right heart strain and bil lower extremity DVTs. S/p PA angiogram and thrombectomy 2/5. PMH: cancer, CHF, COPD, coronary artery disease, GERD, HTN, MI, PVD    PT Comments    Pt reporting severe L hip pain, states he "can't walk" given severity of pain. PT focused on standing tolerance, pre-gait, and LE exercise. Pt stood x3 minutes before fatiguing, and tolerated standing marches with LLE well but could not lift RLE off ground given increased WB on LLE. Pt also limited by orthostatic hypotension (see below) as well as SpO2 desaturation to 80s% on 6LO2. Plan remains appropriate, PT to continue to follow .  SPO2 upper 70s/low 80s on 6LO2 with standing, not a good reading so changed sensor from R index finger to ear with range from 86-98% on 6LO2    BP immediately post-stand: 65/54 BP repeated sitting: 86/56 BP sitting 2 minutes: 82/56 BP return to supine: 110/65    Recommendations for follow up therapy are one component of a multi-disciplinary discharge planning process, led by the attending physician.  Recommendations may be updated based on patient status, additional functional criteria and insurance authorization.  Follow Up Recommendations  Skilled nursing-short term rehab (<3 hours/day)     Assistance Recommended at Discharge Intermittent Supervision/Assistance  Patient can return home with the following A little help with walking and/or transfers;A little help with bathing/dressing/bathroom;Assistance with cooking/housework;Assist for transportation;Help with stairs or ramp for entrance   Equipment Recommendations  Rolling walker (2 wheels);BSC/3in1;Other (comment) (vs defer to next venue)    Recommendations for Other Services        Precautions / Restrictions Precautions Precautions: Fall Precaution Comments: watch SpO2 - 6LO2 Restrictions Weight Bearing Restrictions: No RLE Weight Bearing: Weight bearing as tolerated     Mobility  Bed Mobility Overal bed mobility: Needs Assistance Bed Mobility: Sit to Supine       Sit to supine: Supervision   General bed mobility comments: sitting EOB upon PT arrival to room, increased time to return to supine but no physical assist    Transfers Overall transfer level: Needs assistance Equipment used: Rolling walker (2 wheels) Transfers: Sit to/from Stand Sit to Stand: Min assist           General transfer comment: light rise and steady assist, unable to progress away from bed given L hip pain and orthostasis    Ambulation/Gait                   Stairs             Wheelchair Mobility    Modified Rankin (Stroke Patients Only)       Balance Overall balance assessment: Needs assistance Sitting-balance support: No upper extremity supported, Feet supported Sitting balance-Leahy Scale: Good     Standing balance support: Reliant on assistive device for balance, Bilateral upper extremity supported Standing balance-Leahy Scale: Poor                              Cognition Arousal/Alertness: Awake/alert Behavior During Therapy: WFL for tasks assessed/performed Overall Cognitive Status: Within Functional Limits for tasks assessed  Exercises General Exercises - Lower Extremity Heel Slides: AROM, Left, 5 reps, Supine Hip Flexion/Marching: AROM, Left, 10 reps, Standing    General Comments General comments (skin integrity, edema, etc.): SPO2 upper 70s/low 80s on 6LO2 with standing, not a good reading so changed sensor from R index finger to ear with range from 86-98% on 6LO2. BP to 80s/50s standing EOB, returned to supine with recovery to 110/65      Pertinent Vitals/Pain  Pain Assessment Pain Assessment: Faces Faces Pain Scale: Hurts little more Pain Location: L hip Pain Descriptors / Indicators: Discomfort, Grimacing, Guarding Pain Intervention(s): Limited activity within patient's tolerance, Monitored during session, Repositioned    Home Living                          Prior Function            PT Goals (current goals can now be found in the care plan section) Acute Rehab PT Goals Patient Stated Goal: to find a new home PT Goal Formulation: With patient Time For Goal Achievement: 04/26/22 Potential to Achieve Goals: Good Progress towards PT goals: Progressing toward goals    Frequency    Min 2X/week      PT Plan Current plan remains appropriate    Co-evaluation              AM-PAC PT "6 Clicks" Mobility   Outcome Measure  Help needed turning from your back to your side while in a flat bed without using bedrails?: A Little Help needed moving from lying on your back to sitting on the side of a flat bed without using bedrails?: A Little Help needed moving to and from a bed to a chair (including a wheelchair)?: A Little Help needed standing up from a chair using your arms (e.g., wheelchair or bedside chair)?: A Little Help needed to walk in hospital room?: A Lot Help needed climbing 3-5 steps with a railing? : Total 6 Click Score: 15    End of Session Equipment Utilized During Treatment: Oxygen Activity Tolerance: Patient limited by pain;Patient limited by fatigue Patient left: with call bell/phone within reach;in bed;with bed alarm set Nurse Communication: Mobility status;Other (comment) (NT notified of change in SpO2 sensor location, RN notified of orthostasis) PT Visit Diagnosis: Unsteadiness on feet (R26.81);Other abnormalities of gait and mobility (R26.89);Muscle weakness (generalized) (M62.81);History of falling (Z91.81);Repeated falls (R29.6);Difficulty in walking, not elsewhere classified (R26.2);Pain Pain -  Right/Left: Left Pain - part of body: Hip     Time: [id number] PT Time Calculation (min) (ACUTE ONLY): 22 min  Charges:  $Therapeutic Activity: 8-22 mins                     Stacie Glaze, PT DPT Acute Rehabilitation Services Pager [phone]  Office [phone]   Junella Domke E Ruffin Pyo 04/14/2022, 1:24 PM

## 2022-04-15 DIAGNOSIS — I2609 Other pulmonary embolism with acute cor pulmonale: Secondary | ICD-10-CM | POA: Diagnosis not present

## 2022-04-15 DIAGNOSIS — I5021 Acute systolic (congestive) heart failure: Secondary | ICD-10-CM | POA: Diagnosis not present

## 2022-04-15 DIAGNOSIS — J9601 Acute respiratory failure with hypoxia: Secondary | ICD-10-CM | POA: Diagnosis not present

## 2022-04-15 LAB — BASIC METABOLIC PANEL
Anion gap: 8 (ref 5–15)
BUN: 14 mg/dL (ref 8–23)
CO2: 24 mmol/L (ref 22–32)
Calcium: 9 mg/dL (ref 8.9–10.3)
Chloride: 107 mmol/L (ref 98–111)
Creatinine, Ser: 1.24 mg/dL (ref 0.61–1.24)
GFR, Estimated: >60 mL/min (ref >60)
Glucose, Bld: 116 mg/dL — ABNORMAL HIGH (ref 70–99)
Potassium: 4.1 mmol/L (ref 3.5–5.1)
Sodium: 139 mmol/L (ref 135–145)

## 2022-04-15 LAB — CBC
HCT: 34.6 % — ABNORMAL LOW (ref 39.0–52.0)
Hemoglobin: 11.5 g/dL — ABNORMAL LOW (ref 13.0–17.0)
MCH: 31 pg (ref 26.0–34.0)
MCHC: 33.2 g/dL (ref 30.0–36.0)
MCV: 93.3 fL (ref 80.0–100.0)
Platelets: 186 10*3/uL (ref 150–400)
RBC: 3.71 MIL/uL — ABNORMAL LOW (ref 4.22–5.81)
RDW: 13.9 % (ref 11.5–15.5)
WBC: 5.9 10*3/uL (ref 4.0–10.5)
nRBC: 0 % (ref 0.0–0.2)

## 2022-04-15 LAB — MAGNESIUM: Magnesium: 2.1 mg/dL (ref 1.7–2.4)

## 2022-04-15 NOTE — Progress Notes (Signed)
PROGRESS NOTE    Bruce [name]  [id number] DOB: [birth year] DOA: 04/09/2022 PCP: [name], NP   Brief Narrative:  77 year old male with history of paroxysmal A-fib, CAD/MI, chronic systolic heart failure, COPD, hypertension, prostate cancer, colon cancer, DVT 5 years ago, tobacco abuse presented with chest pain and dizziness.  Workup in the ED revealed submassive PE with right heart strain.  He was admitted to ICU under PCCM service and started on heparin drip.  He underwent IR thrombectomy on 04/10/2022.  Echo showed EF of 20 to 25% with global hypokinesis.  Heart failure team was consulted.  He was transferred to [facility] service from 04/12/2022 onwards.  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  Started on heparin drip and admitted to ICU under PCCM service.  Underwent IR thrombectomy of right pulmonary artery on 04/10/2022. -Transferred to [facility] service from 04/12/2022 onwards -Will possibly need lifelong anticoagulation: Transitioned to Eliquis from heparin drip on 04/13/2022 -Currently on 5 L oxygen via hyponasal cannula.  Wean off as able  Acute on chronic systolic heart failure Minimal CAD Moderate to severe PAH -Echo shows EF of 20 to 25%.  Previous EF of 40 to 45%. -Currently compensated.  Strict input and output.  Daily weights.  Fluid restriction --heart failure team following: Currently on Entresto, spironolactone and digoxin.  Status post left and right heart cath on 04/13/2022.  Patient has been started on sildenafil by heart failure team. -Blood pressure has been extremely low since yesterday evening antihypertensives on hold.  Will hold antihypertensives this morning and  will till reevaluation by heart failure team.  Paroxysmal A-fib -Currently mostly rate controlled.  Continue digoxin, amiodarone.  Currently on Eliquis  Hyponatremia -Improved  COPD -Currently stable.  Continue current inhaled regimen.  Left upper lobe lung nodule -Enlarging left upper lobe lung nodule.  Needs follow-up CT in 3 months and outpatient pulmonary follow-up  BPH -Continue tamsulosin  Restless leg syndrome -Continue Requip  Left hip pain Ambulatory dysfunction -Apparently fell few weeks ago on the left hip and has been having left hip pain.  No fractures noted on x-rays. -PT recommended SNF placement.  TOC consulted.  GERD -Continue Protonix  History of prostate cancer -Outpatient follow-up with urology  Anxiety -Continue as needed Xanax  DVT prophylaxis: Eliquis Code Status: Full Family Communication: None at bedside Disposition Plan: Status is: Inpatient Remains inpatient appropriate because: Of severity of illness.  Need for SNF placement  Consultants: PCCM/IR/cardiology  Procedures: As above  Antimicrobials: None   Subjective: Patient seen and examined at bedside.  Denies  fever, vomiting or worsening shortness of breath.  Had some chest pressure this morning briefly; none currently. Objective: Vitals:   04/14/22 2312 04/15/22 0000 04/15/22 0304 04/15/22 0728  BP: 93/75 103/67 109/69 93/62  Pulse: 84 79 92 97  Resp: 20 20 18 $ (!) 28  Temp: 99.1 F (37.3 C)  97.6 F (36.4 C) 97.6 F (36.4 C)  TempSrc: Oral  Axillary Oral  SpO2: 99% 96% 99% 95%  Weight:   70.8 kg   Height:        Intake/Output Summary (Last 24 hours) at 04/15/2022 0731 Last data filed at 04/14/2022 2313 Gross per 24 hour  Intake 1078 ml  Output 350 ml  Net 728 ml    Filed Weights   04/13/22 0349 04/14/22 0602 04/15/22 0304  Weight: 72 kg 70.7 kg 70.8 kg    Examination:  General: No distress.  On 5 L oxygen via high flow nasal cannula.  Chronically ill and  deconditioned ENT/neck: No obvious neck masses or JVD elevation noted  respiratory: Decreased breath sounds at bases bilaterally with scattered crackles and intermittent tachypnea  CVS: S1-S2 heard; rate controlled mostly  abdominal: Soft, nontender, still has some distention; no organomegaly, bowel sounds are heard extremities: Trace lower extremity edema present; no cyanosis CNS: Awake and alert but still slow to respond.  Poor historian.  No focal neurologic deficit.  Moves extremities  lymph: No cervical lymphadenopathy palpable  skin: No obvious ecchymosis/lesions psych: Showing no signs of agitation.  Affect is mostly flat.   Musculoskeletal: No obvious joint erythema/tenderness   Data Reviewed: I have personally reviewed following labs and imaging studies  CBC: Recent Labs  Lab 04/09/22 1645 04/09/22 1805 04/11/22 0622 04/12/22 0021 04/13/22 0014 04/13/22 1118 04/13/22 1125 04/14/22 0019 04/15/22 0011  WBC 8.8   < > 11.1* 10.6* 7.4  --   --  5.7 5.9  NEUTROABS 7.4  --   --   --   --   --   --   --   --   HGB 13.8   < > 12.0* 11.7* 11.5* 10.5* 11.6*  11.9* 11.5* 11.5*  HCT 41.8   < > 35.3* 35.7* 33.1* 31.0* 34.0*  35.0* 34.6* 34.6*  MCV 93.9   < > 92.9 94.2 90.7  --   --  92.0 93.3  PLT 162   < > 167 168 168  --   --  173 186   < > = values in this interval not displayed.    Basic Metabolic Panel: Recent Labs  Lab 04/09/22 1645 04/09/22 1805 04/10/22 0901 04/12/22 0021 04/13/22 0014 04/13/22 1118 04/13/22 1125 04/14/22 0019 04/15/22 0011  NA 142   < > 135 138 135 144 141  140 134* 139  K 3.5   < > 4.4 3.8 3.5 3.6 4.0  4.1 3.5 4.1  CL 112*   < > 108 108 106  --   --  103 107  CO2 18*   < > 16* 21* 22  --   --  22 24  GLUCOSE 133*   < > 164* 95 102*  --   --  118* 116*  BUN 12   < > 17 18 17  $ --   --  15 14  CREATININE 1.07   < > 0.98 0.96 1.11  --   --  1.03 1.24  CALCIUM 8.8*   < > 9.4 8.5* 8.5*  --   --  8.5* 9.0  MG 1.8  --   --   --  2.0  --   --   2.1 2.1   < > = values in this interval not displayed.    GFR: Estimated Creatinine Clearance: 52.3 mL/min (by C-G formula based on SCr of 1.24 mg/dL). Liver Function Tests: Recent Labs  Lab 04/09/22 1645  AST 31  ALT 23  ALKPHOS 83  BILITOT 1.7*  PROT 6.4*  ALBUMIN 3.4*    No results for input(s): "LIPASE", "AMYLASE" in the last 168 hours. No results for input(s): "AMMONIA" in the last 168 hours. Coagulation Profile: Recent Labs  Lab 04/09/22 1645  INR 1.4*    Cardiac Enzymes:  No results for input(s): "CKTOTAL", "CKMB", "CKMBINDEX", "TROPONINI" in the last 168 hours. BNP (last 3 results) No results for input(s): "PROBNP" in the last 8760 hours. HbA1C: No results for input(s): "HGBA1C" in the last 72 hours. CBG: No results for input(s): "GLUCAP" in the last 168 hours. Lipid Profile: No results for input(s): "CHOL", "HDL", "LDLCALC", "TRIG", "CHOLHDL", "LDLDIRECT" in the last 72 hours. Thyroid Function Tests: No results for input(s): "TSH", "T4TOTAL", "FREET4", "T3FREE", "THYROIDAB" in the last 72 hours. Anemia Panel: No results for input(s): "VITAMINB12", "FOLATE", "FERRITIN", "TIBC", "IRON", "RETICCTPCT" in the last 72 hours. Sepsis Labs: Recent Labs  Lab 04/09/22 2200 04/10/22 0032 04/10/22 0901  LATICACIDVEN 5.4* 3.5* 2.4*     Recent Results (from the past 240 hour(s))  Resp panel by RT-PCR (RSV, Flu A&B, Covid) Anterior Nasal Swab     Status: None   Collection Time: 04/09/22  4:57 PM   Specimen: Anterior Nasal Swab  Result Value Ref Range Status   SARS Coronavirus 2 by RT PCR NEGATIVE NEGATIVE Final   Influenza A by PCR NEGATIVE NEGATIVE Final   Influenza B by PCR NEGATIVE NEGATIVE Final    Comment: (NOTE) The Xpert Xpress SARS-CoV-2/FLU/RSV plus assay is intended as an aid in the diagnosis of influenza from Nasopharyngeal swab specimens and should not be used as a sole basis for treatment. Nasal washings and aspirates are unacceptable for Xpert  Xpress SARS-CoV-2/FLU/RSV testing.  Fact Sheet for Patients: EntrepreneurPulse.com.au  Fact Sheet for Healthcare Providers: IncredibleEmployment.be  This test is not yet approved or cleared by the [place] FDA and has been authorized for detection and/or diagnosis of SARS-CoV-2 by FDA under an Emergency Use Authorization (EUA). This EUA will remain in effect (meaning this test can be used) for the duration of the COVID-19 declaration under Section 564(b)(1) of the Act, 21 U.S.C. section 360bbb-3(b)(1), unless the authorization is terminated or revoked.     Resp Syncytial Virus by PCR NEGATIVE NEGATIVE Final    Comment: (NOTE) Fact Sheet for Patients: EntrepreneurPulse.com.au  Fact Sheet for Healthcare Providers: IncredibleEmployment.be  This test is not yet approved or cleared by the [place] FDA and has been authorized for detection and/or diagnosis of SARS-CoV-2 by FDA under an Emergency Use Authorization (EUA). This EUA will remain in effect (meaning this test can be used) for the duration of the COVID-19 declaration under Section 564(b)(1) of the Act, 21 U.S.C. section 360bbb-3(b)(1), unless the authorization is terminated or revoked.  Performed at [facility], [place] [address] [place], [place] [address]   MRSA Next Gen by PCR, Nasal     Status: None   Collection Time: 04/10/22  1:09 AM   Specimen: Nasal Mucosa; Nasal Swab  Result Value Ref Range Status   MRSA by PCR Next Gen NOT DETECTED NOT DETECTED Final    Comment: (NOTE) The GeneXpert MRSA Assay (FDA approved for NASAL specimens only), is one component of a comprehensive MRSA colonization surveillance program. It is not intended to diagnose MRSA infection nor to guide or monitor treatment for MRSA infections. Test performance is not FDA approved in patients less than 83 years old. Performed at [facility], [place]  [address], [place], [place] [address]          Radiology Studies: CARDIAC CATHETERIZATION  Result Date: 04/13/2022   Prox RCA to Mid RCA lesion is 30% stenosed.   Ost LAD to Prox LAD lesion is 40% stenosed.   The left ventricular ejection fraction is less than  25% by visual estimate. Findings: Ao = 122/78 (96) LV = 123/6 RA = 4 RV = 77/8 LPA = 73/23 (39) RPA = 85/24 (44) PCW = 16 Fick cardiac output/index = 4.3/2.2 PVR = 5.0 Ao sat = 90% PA sat = 51%, 50% Assessment: 1. Minimal CAD 2. 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 Start sildeanfil and Xarelto. Glori Bickers, MD 1:18 PM       Scheduled Meds:  amiodarone  200 mg Oral QHS   apixaban  10 mg Oral BID   [START ON 04/21/2022] apixaban  5 mg Oral BID   dapagliflozin propanediol  10 mg Oral Daily   digoxin  0.125 mg Oral Daily   pantoprazole  40 mg Oral Daily   rOPINIRole  1 mg Oral QHS   rosuvastatin  10 mg Oral Daily   sacubitril-valsartan  1 tablet Oral BID   sildenafil  20 mg Oral TID   sodium chloride flush  3 mL Intravenous Q12H   sodium chloride flush  3 mL Intravenous Q12H   spironolactone  25 mg Oral Daily   umeclidinium-vilanterol  1 puff Inhalation Daily   Continuous Infusions:  sodium chloride            Aline August, MD [facility] 04/15/2022, 7:31 AM

## 2022-04-15 NOTE — Progress Notes (Signed)
Progress Note  Patient Name: Bruce [name] Date of Encounter: 04/15/2022  Primary Cardiologist:   Neoma Laming, MD   Subjective   Feels weak.  No pain.  Still SOB.   Inpatient Medications    Scheduled Meds:  amiodarone  200 mg Oral QHS   apixaban  10 mg Oral BID   [START ON 04/21/2022] apixaban  5 mg Oral BID   dapagliflozin propanediol  10 mg Oral Daily   digoxin  0.125 mg Oral Daily   pantoprazole  40 mg Oral Daily   rOPINIRole  1 mg Oral QHS   rosuvastatin  10 mg Oral Daily   sacubitril-valsartan  1 tablet Oral BID   sildenafil  20 mg Oral TID   sodium chloride flush  3 mL Intravenous Q12H   sodium chloride flush  3 mL Intravenous Q12H   spironolactone  25 mg Oral Daily   umeclidinium-vilanterol  1 puff Inhalation Daily   Continuous Infusions:  sodium chloride     PRN Meds: sodium chloride, acetaminophen, albuterol, ALPRAZolam, docusate sodium, mouth rinse, polyethylene glycol, sodium chloride flush   Vital Signs    Vitals:   04/15/22 0728 04/15/22 0746 04/15/22 0752 04/15/22 1148  BP: 93/62   109/74  Pulse: 97     Resp: 20 20    Temp: 97.6 F (36.4 C)   97.6 F (36.4 C)  TempSrc: Oral   Oral  SpO2: 95%  100%   Weight:      Height:        Intake/Output Summary (Last 24 hours) at 04/15/2022 1351 Last data filed at 04/15/2022 0700 Gross per 24 hour  Intake 1200 ml  Output 350 ml  Net 850 ml   Filed Weights   04/13/22 0349 04/14/22 0602 04/15/22 0304  Weight: 72 kg 70.7 kg 70.8 kg    Telemetry    NSR, ventricular ectopy - Personally Reviewed  ECG    NA - Personally Reviewed  Physical Exam   GEN: No acute distress.  Frail appearing Neck: No  JVD Cardiac: RRR, no murmurs, rubs, or gallops.  Respiratory:    Decreased breath sounds bilaterally without crackles.  GI: Soft, nontender, non-distended  MS: No  edema; No deformity. Neuro:  Nonfocal  Psych: Normal affect   Labs    Chemistry Recent Labs  Lab 04/09/22 1645 04/09/22 1805  04/13/22 0014 04/13/22 1118 04/13/22 1125 04/14/22 0019 04/15/22 0011  NA 142   < > 135   < > 141  140 134* 139  K 3.5   < > 3.5   < > 4.0  4.1 3.5 4.1  CL 112*   < > 106  --   --  103 107  CO2 18*   < > 22  --   --  22 24  GLUCOSE 133*   < > 102*  --   --  118* 116*  BUN 12   < > 17  --   --  15 14  CREATININE 1.07   < > 1.11  --   --  1.03 1.24  CALCIUM 8.8*   < > 8.5*  --   --  8.5* 9.0  PROT 6.4*  --   --   --   --   --   --   ALBUMIN 3.4*  --   --   --   --   --   --   AST 31  --   --   --   --   --   --  ALT 23  --   --   --   --   --   --   ALKPHOS 83  --   --   --   --   --   --   BILITOT 1.7*  --   --   --   --   --   --   GFRNONAA >60   < > >60  --   --  >60 >60  ANIONGAP 12   < > 7  --   --  9 8   < > = values in this interval not displayed.     Hematology Recent Labs  Lab 04/13/22 0014 04/13/22 1118 04/13/22 1125 04/14/22 0019 04/15/22 0011  WBC 7.4  --   --  5.7 5.9  RBC 3.65*  --   --  3.76* 3.71*  HGB 11.5*   < > 11.6*  11.9* 11.5* 11.5*  HCT 33.1*   < > 34.0*  35.0* 34.6* 34.6*  MCV 90.7  --   --  92.0 93.3  MCH 31.5  --   --  30.6 31.0  MCHC 34.7  --   --  33.2 33.2  RDW 13.7  --   --  13.8 13.9  PLT 168  --   --  173 186   < > = values in this interval not displayed.    Cardiac EnzymesNo results for input(s): "TROPONINI" in the last 168 hours. No results for input(s): "TROPIPOC" in the last 168 hours.   BNP Recent Labs  Lab 04/09/22 1645 04/10/22 0901  BNP 537.6* 1,012.6*     DDimer No results for input(s): "DDIMER" in the last 168 hours.   Radiology    No results found.  Cardiac Studies   Echo 04/10/2022  1. Left ventricular ejection fraction, by estimation, is 20 to 25%. The  left ventricle has severely decreased function. The left ventricle  demonstrates global hypokinesis. The left ventricular internal cavity size  was mildly dilated. Left ventricular  diastolic parameters are consistent with Grade I diastolic dysfunction   (impaired relaxation).   2. Mildly D-shaped interventricular septum suggests RV pressure/volume  overload. Right ventricular systolic function is severely reduced. The  right ventricular size is moderately enlarged. Tricuspid regurgitation  signal is inadequate for assessing PA  pressure.   3. The mitral valve is normal in structure. No evidence of mitral valve  regurgitation. No evidence of mitral stenosis.   4. The tricuspid valve is abnormal. Tricuspid valve regurgitation is mild  to moderate.   5. The aortic valve is tricuspid. There is mild calcification of the  aortic valve. Aortic valve regurgitation is not visualized. No aortic  stenosis is present.   6. The inferior vena cava is dilated in size with <50% respiratory  variability, suggesting right atrial pressure of 15 mmHg.    Patient Profile     77 y.o. male  male with COPD ad ongoing tobacco use, CAD, systolic HF EF [id number], colon CA previous lung CA. 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 Also with a/c LE DVT.  EF also found to be down to 20-25%. RV severely reduced.   Assessment & Plan    Acute submassive PE:  Transitioned from heparin to Eliquis.  Continue current therapy.    Acute systolic HF:  Unable to tolerate meds with hypotension.  Also with severe pulmonary HTN.  Sildenafil added.    He has not  had his Entresto, sildenafil or spironolactone because of hypotension today.  I will stop the Farxiga, spiro and sildenafil for now.  Likely will have to have these titrated more slowly.  Hopefully be able to restart Entresto in the AM.   Suspect that he is somewhat volume dependent with the PE and RV dysfunction.   PAF:  Maintaining NSR.  On digoxin and amiodarone.     For questions or updates, please contact [place] Please consult [email or web address] for contact info under Cardiology/STEMI.   Signed, Minus Breeding, MD  04/15/2022, 1:51 PM

## 2022-04-15 NOTE — Progress Notes (Signed)
Mobility Specialist Progress Note:   04/15/22 1251  Mobility  Activity Stood at bedside  Level of Assistance Standby assist, set-up cues, supervision of patient - no hands on  Assistive Device Front wheel walker  RLE Weight Bearing WBAT  Activity Response Tolerated well  $Mobility charge 1 Mobility   Pt in bed willing to participate in mobility. Complaints of 10/10 R hip pain. Pt able to stand for roughly 3 min before needing to sit.  Left in bed with call bell in reach and all needs met.   Gareth Eagle Jakai Onofre Mobility Specialist Please contact via [employer] or  Rehab Office at [phone]

## 2022-04-16 DIAGNOSIS — J9601 Acute respiratory failure with hypoxia: Secondary | ICD-10-CM | POA: Diagnosis not present

## 2022-04-16 DIAGNOSIS — I272 Pulmonary hypertension, unspecified: Secondary | ICD-10-CM | POA: Diagnosis not present

## 2022-04-16 DIAGNOSIS — I5021 Acute systolic (congestive) heart failure: Secondary | ICD-10-CM | POA: Diagnosis not present

## 2022-04-16 DIAGNOSIS — I2609 Other pulmonary embolism with acute cor pulmonale: Secondary | ICD-10-CM | POA: Diagnosis not present

## 2022-04-16 LAB — CBC
HCT: 34.9 % — ABNORMAL LOW (ref 39.0–52.0)
Hemoglobin: 11.8 g/dL — ABNORMAL LOW (ref 13.0–17.0)
MCH: 30.9 pg (ref 26.0–34.0)
MCHC: 33.8 g/dL (ref 30.0–36.0)
MCV: 91.4 fL (ref 80.0–100.0)
Platelets: 219 10*3/uL (ref 150–400)
RBC: 3.82 MIL/uL — ABNORMAL LOW (ref 4.22–5.81)
RDW: 14.1 % (ref 11.5–15.5)
WBC: 7.6 10*3/uL (ref 4.0–10.5)
nRBC: 0 % (ref 0.0–0.2)

## 2022-04-16 LAB — BASIC METABOLIC PANEL
Anion gap: 8 (ref 5–15)
BUN: 12 mg/dL (ref 8–23)
CO2: 23 mmol/L (ref 22–32)
Calcium: 9.1 mg/dL (ref 8.9–10.3)
Chloride: 108 mmol/L (ref 98–111)
Creatinine, Ser: 1.18 mg/dL (ref 0.61–1.24)
GFR, Estimated: >60 mL/min (ref >60)
Glucose, Bld: 129 mg/dL — ABNORMAL HIGH (ref 70–99)
Potassium: 3.9 mmol/L (ref 3.5–5.1)
Sodium: 139 mmol/L (ref 135–145)

## 2022-04-16 LAB — MAGNESIUM: Magnesium: 2 mg/dL (ref 1.7–2.4)

## 2022-04-16 MED ORDER — SACUBITRIL-VALSARTAN 24-26 MG PO TABS
1.0000 | ORAL_TABLET | Freq: Two times a day (BID) | ORAL | Status: DC
  Administered 2022-04-16 – 2022-04-17 (×3): 1 via ORAL
  Filled 2022-04-16 (×3): qty 1

## 2022-04-16 MED ORDER — OXYCODONE-ACETAMINOPHEN 5-325 MG PO TABS
1.0000 | ORAL_TABLET | Freq: Once | ORAL | Status: AC
  Administered 2022-04-16: 1 via ORAL
  Filled 2022-04-16: qty 1

## 2022-04-16 NOTE — Progress Notes (Signed)
Progress Note  Patient Name: Bruce [name] Date of Encounter: 04/16/2022  Primary Cardiologist:   Neoma Laming, MD   Subjective   Feels a little bit better today.  No pain.    Inpatient Medications    Scheduled Meds:  amiodarone  200 mg Oral QHS   apixaban  10 mg Oral BID   [START ON 04/21/2022] apixaban  5 mg Oral BID   digoxin  0.125 mg Oral Daily   pantoprazole  40 mg Oral Daily   rOPINIRole  1 mg Oral QHS   rosuvastatin  10 mg Oral Daily   sodium chloride flush  3 mL Intravenous Q12H   sodium chloride flush  3 mL Intravenous Q12H   umeclidinium-vilanterol  1 puff Inhalation Daily   Continuous Infusions:  sodium chloride     PRN Meds: sodium chloride, acetaminophen, albuterol, ALPRAZolam, docusate sodium, mouth rinse, polyethylene glycol, sodium chloride flush   Vital Signs    Vitals:   04/16/22 0200 04/16/22 0335 04/16/22 0500 04/16/22 0717  BP: 95/65 106/70  121/80  Pulse: 89 89  94  Resp: 18 18  20  $ Temp:  98 F (36.7 C)  98 F (36.7 C)  TempSrc:  Oral  Oral  SpO2: 93% 91%  93%  Weight:   70.8 kg   Height:        Intake/Output Summary (Last 24 hours) at 04/16/2022 0803 Last data filed at 04/15/2022 2338 Gross per 24 hour  Intake 1100 ml  Output 400 ml  Net 700 ml   Filed Weights   04/14/22 0602 04/15/22 0304 04/16/22 0500  Weight: 70.7 kg 70.8 kg 70.8 kg    Telemetry    NA - Personally Reviewed  ECG    NA - Personally Reviewed  Physical Exam   GEN: No  acute distress.   Neck: No  JVD Cardiac: RRR, no murmurs, rubs, or gallops.  Respiratory: Clear   to auscultation bilaterally. GI: Soft, nontender, non-distended, normal bowel sounds  MS:  No edema; No deformity. Neuro:   Nonfocal  Psych: Oriented and appropriate    Labs    Chemistry Recent Labs  Lab 04/09/22 1645 04/09/22 1805 04/14/22 0019 04/15/22 0011 04/16/22 0018  NA 142   < > 134* 139 139  K 3.5   < > 3.5 4.1 3.9  CL 112*   < > 103 107 108  CO2 18*   < > 22 24  23  $ GLUCOSE 133*   < > 118* 116* 129*  BUN 12   < > 15 14 12  $ CREATININE 1.07   < > 1.03 1.24 1.18  CALCIUM 8.8*   < > 8.5* 9.0 9.1  PROT 6.4*  --   --   --   --   ALBUMIN 3.4*  --   --   --   --   AST 31  --   --   --   --   ALT 23  --   --   --   --   ALKPHOS 83  --   --   --   --   BILITOT 1.7*  --   --   --   --   GFRNONAA >60   < > >60 >60 >60  ANIONGAP 12   < > 9 8 8   $ < > = values in this interval not displayed.     Hematology Recent Labs  Lab 04/14/22 0019 04/15/22 0011 04/16/22 0018  WBC  5.7 5.9 7.6  RBC 3.76* 3.71* 3.82*  HGB 11.5* 11.5* 11.8*  HCT 34.6* 34.6* 34.9*  MCV 92.0 93.3 91.4  MCH 30.6 31.0 30.9  MCHC 33.2 33.2 33.8  RDW 13.8 13.9 14.1  PLT 173 186 219    Cardiac EnzymesNo results for input(s): "TROPONINI" in the last 168 hours. No results for input(s): "TROPIPOC" in the last 168 hours.   BNP Recent Labs  Lab 04/09/22 1645 04/10/22 0901  BNP 537.6* 1,012.6*     DDimer No results for input(s): "DDIMER" in the last 168 hours.   Radiology    No results found.  Cardiac Studies   Echo 04/10/2022  1. Left ventricular ejection fraction, by estimation, is 20 to 25%. The  left ventricle has severely decreased function. The left ventricle  demonstrates global hypokinesis. The left ventricular internal cavity size  was mildly dilated. Left ventricular  diastolic parameters are consistent with Grade I diastolic dysfunction  (impaired relaxation).   2. Mildly D-shaped interventricular septum suggests RV pressure/volume  overload. Right ventricular systolic function is severely reduced. The  right ventricular size is moderately enlarged. Tricuspid regurgitation  signal is inadequate for assessing PA  pressure.   3. The mitral valve is normal in structure. No evidence of mitral valve  regurgitation. No evidence of mitral stenosis.   4. The tricuspid valve is abnormal. Tricuspid valve regurgitation is mild  to moderate.   5. The aortic valve is  tricuspid. There is mild calcification of the  aortic valve. Aortic valve regurgitation is not visualized. No aortic  stenosis is present.   6. The inferior vena cava is dilated in size with <50% respiratory  variability, suggesting right atrial pressure of 15 mmHg.    Patient Profile     76 y.o. male  male with COPD ad ongoing tobacco use, CAD, systolic HF EF [id number], colon CA previous lung CA. 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 Also with a/c LE DVT.  EF also found to be down to 20-25%. RV severely reduced.   Assessment & Plan    Acute submassive PE:  Transitioned from heparin to Eliquis.   Continue current therapy.    Acute systolic HF:    BP labile but seems to be up.  I will restart Entresto today.    PAF:   NSR.  Continue dig and amiodarone.  For questions or updates, please contact [place] Please consult [email or web address] for contact info under Cardiology/STEMI.   Signed, Minus Breeding, MD  04/16/2022, 8:03 AM

## 2022-04-16 NOTE — Progress Notes (Signed)
PROGRESS NOTE    Bruce [name]  [id number] DOB: 1945/11/18 DOA: 04/09/2022 PCP: [name], NP   Brief Narrative:  77 year old male with history of paroxysmal A-fib, CAD/MI, chronic systolic heart failure, COPD, hypertension, prostate cancer, colon cancer, DVT 5 years ago, tobacco abuse presented with chest pain and dizziness.  Workup in the ED revealed submassive PE with right heart strain.  He was admitted to ICU under PCCM service and started on heparin drip.  He underwent IR thrombectomy on 04/10/2022.  Echo showed EF of 20 to 25% with global hypokinesis.  Heart failure team was consulted.  He was transferred to [facility] service from 04/12/2022 onwards.  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  Started on heparin drip and admitted to ICU under PCCM service.  Underwent IR thrombectomy of right pulmonary artery on 04/10/2022. -Transferred to [facility] service from 04/12/2022 onwards -Will possibly need lifelong anticoagulation: Transitioned to Eliquis from heparin drip on 04/13/2022 -Currently on 2 L oxygen via hyponasal cannula.  Wean off as able  Acute on chronic systolic heart failure Minimal CAD Moderate to severe PAH -Echo shows EF of 20 to 25%.  Previous EF of 40 to 45%. -Currently compensated.  Strict input and output.  Daily weights.  Fluid restriction -- Cardiology/heart failure team following: Currently on digoxin.  Status post left and right heart cath on 04/13/2022.  Entresto, spironolactone and sildenafil held on 04/15/2022 by cardiology team because of hypotension.  Blood pressure currently improving.   Paroxysmal A-fib -Currently mostly rate controlled.  Continue digoxin,  amiodarone.  Currently on Eliquis  Hyponatremia -Improved  COPD -Currently stable.  Continue current inhaled regimen.  Left upper lobe lung nodule -Enlarging left upper lobe lung nodule.  Needs follow-up CT in 3 months and outpatient pulmonary follow-up  BPH -Continue tamsulosin  Restless leg syndrome -Continue Requip  Left hip pain Ambulatory dysfunction -Apparently fell few weeks ago on the left hip and has been having left hip pain.  No fractures noted on x-rays. -PT recommended SNF placement.  TOC consulted.  GERD -Continue Protonix  History of prostate cancer -Outpatient follow-up with urology  Anxiety -Continue as needed Xanax  DVT prophylaxis: Eliquis Code Status: Full Family Communication: None at bedside Disposition Plan: Status is: Inpatient Remains inpatient appropriate because: Of severity of illness.  Need for SNF placement  Consultants: PCCM/IR/cardiology  Procedures: As above  Antimicrobials: None   Subjective: Patient seen and examined at bedside.  No chest pain, fever, vomiting reported. Feels lightheaded intermittently Objective: Vitals:   04/16/22 0200 04/16/22 0335 04/16/22 0500 04/16/22 0717  BP: 95/65 106/70  121/80  Pulse: 89 89  94  Resp: 18 18  20  $ Temp:  98 F (36.7 C)  98 F (36.7 C)  TempSrc:  Oral  Oral  SpO2: 93% 91%  93%  Weight:   70.8 kg   Height:        Intake/Output Summary (Last 24 hours) at 04/16/2022 0733 Last data filed at 04/15/2022 2338 Gross per 24 hour  Intake 1100 ml  Output 400 ml  Net 700 ml    Filed Weights   04/14/22 0602 04/15/22 0304 04/16/22 0500  Weight: 70.7 kg 70.8 kg 70.8 kg    Examination:  General: Currently 2 L oxygen via nasal cannula.  No acute distress.  Chronically ill and deconditioned ENT/neck: No JVD elevation or palpable neck masses noted respiratory: Bilateral decreased sounds bases with scattered crackles  CVS: Rate controlled currently; S1 and S2 are heard  abdominal:  Soft, nontender, distended slightly; no organomegaly, normal bowel sounds heard  extremities: No clubbing; mild lower extremity edema present CNS: Alert; still slow to respond.  Poor historian.  No focal neurologic deficit.  Able to move extremities  lymph: No lymphadenopathy palpable  skin: No obvious petechiae/rashes psych: Flat affect.  Not agitated. Musculoskeletal: No obvious joint erythema/tenderness   Data Reviewed: I have personally reviewed following labs and imaging studies  CBC: Recent Labs  Lab 04/09/22 1645 04/09/22 1805 04/12/22 0021 04/13/22 0014 04/13/22 1118 04/13/22 1125 04/14/22 0019 04/15/22 0011 04/16/22 0018  WBC 8.8   < > 10.6* 7.4  --   --  5.7 5.9 7.6  NEUTROABS 7.4  --   --   --   --   --   --   --   --   HGB 13.8   < > 11.7* 11.5* 10.5* 11.6*  11.9* 11.5* 11.5* 11.8*  HCT 41.8   < > 35.7* 33.1* 31.0* 34.0*  35.0* 34.6* 34.6* 34.9*  MCV 93.9   < > 94.2 90.7  --   --  92.0 93.3 91.4  PLT 162   < > 168 168  --   --  173 186 219   < > = values in this interval not displayed.    Basic Metabolic Panel: Recent Labs  Lab 04/09/22 1645 04/09/22 1805 04/12/22 0021 04/13/22 0014 04/13/22 1118 04/13/22 1125 04/14/22 0019 04/15/22 0011 04/16/22 0018  NA 142   < > 138 135 144 141  140 134* 139 139  K 3.5   < > 3.8 3.5 3.6 4.0  4.1 3.5 4.1 3.9  CL 112*   < > 108 106  --   --  103 107 108  CO2 18*   < > 21* 22  --   --  22 24 23  $ GLUCOSE 133*   < > 95 102*  --   --  118* 116* 129*  BUN 12   < > 18 17  --   --  15 14 12  $ CREATININE 1.07   < > 0.96 1.11  --   --  1.03 1.24 1.18  CALCIUM 8.8*   < > 8.5* 8.5*  --   --  8.5* 9.0 9.1  MG 1.8  --   --  2.0  --   --  2.1 2.1 2.0   < > = values in this interval not displayed.    GFR: Estimated Creatinine Clearance: 55 mL/min (by C-G formula based on SCr of 1.18 mg/dL). Liver Function Tests: Recent Labs  Lab 04/09/22 1645  AST 31  ALT 23  ALKPHOS 83  BILITOT 1.7*  PROT 6.4*  ALBUMIN 3.4*     No results for input(s): "LIPASE", "AMYLASE" in the last 168 hours. No results for input(s): "AMMONIA" in the last 168 hours. Coagulation Profile: Recent Labs  Lab 04/09/22 1645  INR 1.4*    Cardiac Enzymes: No results for input(s): "CKTOTAL", "CKMB", "CKMBINDEX", "TROPONINI" in the last 168 hours. BNP (last 3 results) No results for input(s): "PROBNP" in the last 8760 hours. HbA1C: No results for input(s): "HGBA1C" in the last 72 hours. CBG: No results for input(s): "GLUCAP" in the last 168 hours.  Lipid Profile: No results for input(s): "CHOL", "HDL", "LDLCALC", "TRIG", "CHOLHDL", "LDLDIRECT" in the last 72 hours. Thyroid Function Tests: No results for input(s): "TSH", "T4TOTAL", "FREET4", "T3FREE", "THYROIDAB" in the last 72 hours. Anemia Panel: No results for input(s): "VITAMINB12", "FOLATE", "FERRITIN", "TIBC", "IRON", "RETICCTPCT" in the last 72 hours. Sepsis Labs: Recent Labs  Lab 04/09/22 2200 04/10/22 0032 04/10/22 0901  LATICACIDVEN 5.4* 3.5* 2.4*     Recent Results (from the past 240 hour(s))  Resp panel by RT-PCR (RSV, Flu A&B, Covid) Anterior Nasal Swab     Status: None   Collection Time: 04/09/22  4:57 PM   Specimen: Anterior Nasal Swab  Result Value Ref Range Status   SARS Coronavirus 2 by RT PCR NEGATIVE NEGATIVE Final   Influenza A by PCR NEGATIVE NEGATIVE Final   Influenza B by PCR NEGATIVE NEGATIVE Final    Comment: (NOTE) The Xpert Xpress SARS-CoV-2/FLU/RSV plus assay is intended as an aid in the diagnosis of influenza from Nasopharyngeal swab specimens and should not be used as a sole basis for treatment. Nasal washings and aspirates are unacceptable for Xpert Xpress SARS-CoV-2/FLU/RSV testing.  Fact Sheet for Patients: EntrepreneurPulse.com.au  Fact Sheet for Healthcare Providers: IncredibleEmployment.be  This test is not yet approved or cleared by the [place] FDA and has been authorized for  detection and/or diagnosis of SARS-CoV-2 by FDA under an Emergency Use Authorization (EUA). This EUA will remain in effect (meaning this test can be used) for the duration of the COVID-19 declaration under Section 564(b)(1) of the Act, 21 U.S.C. section 360bbb-3(b)(1), unless the authorization is terminated or revoked.     Resp Syncytial Virus by PCR NEGATIVE NEGATIVE Final    Comment: (NOTE) Fact Sheet for Patients: EntrepreneurPulse.com.au  Fact Sheet for Healthcare Providers: IncredibleEmployment.be  This test is not yet approved or cleared by the [place] FDA and has been authorized for detection and/or diagnosis of SARS-CoV-2 by FDA under an Emergency Use Authorization (EUA). This EUA will remain in effect (meaning this test can be used) for the duration of the COVID-19 declaration under Section 564(b)(1) of the Act, 21 U.S.C. section 360bbb-3(b)(1), unless the authorization is terminated or revoked.  Performed at [facility], [place] [address] [place], [place] [address]   MRSA Next Gen by PCR, Nasal     Status: None   Collection Time: 04/10/22  1:09 AM   Specimen: Nasal Mucosa; Nasal Swab  Result Value Ref Range Status   MRSA by PCR Next Gen NOT DETECTED NOT DETECTED Final    Comment: (NOTE) The GeneXpert MRSA Assay (FDA approved for NASAL specimens only), is one component of a comprehensive MRSA colonization surveillance program. It is not intended to diagnose MRSA infection nor to guide or monitor treatment for MRSA infections. Test performance is not FDA approved in patients less than 12 years old. Performed at [name], [place] [address] [place], [place] [address]          Radiology Studies: No results found.      Scheduled Meds:  amiodarone  200 mg Oral QHS   apixaban  10 mg Oral BID   [START ON 04/21/2022] apixaban  5 mg Oral BID   digoxin  0.125 mg Oral Daily   pantoprazole  40 mg Oral Daily    rOPINIRole  1 mg Oral QHS   rosuvastatin  10 mg Oral Daily   sodium chloride flush  3 mL Intravenous Q12H   sodium chloride flush  3 mL Intravenous  Q12H   umeclidinium-vilanterol  1 puff Inhalation Daily   Continuous Infusions:  sodium chloride            Aline August, MD [facility] 04/16/2022, 7:33 AM

## 2022-04-16 NOTE — Progress Notes (Signed)
Mobility Specialist Progress Note:   04/16/22 1143  Therapy Vitals  Resp 17  Mobility  Activity Stood at bedside  Level of Assistance Standby assist, set-up cues, supervision of patient - no hands on  Assistive Device Front wheel walker  RLE Weight Bearing WBAT  $Mobility charge 1 Mobility   Pt in bed willing to participate in mobility. Complaints of 10/10 R hip pain. Pt stated he was able to put ~50% weight on R leg but un able to take side steps. Left EOB with call bell in reach and all needs met.   [name] Mobility Specialist Please contact via [employer] or  Rehab Office at [phone]

## 2022-04-17 DIAGNOSIS — I272 Pulmonary hypertension, unspecified: Secondary | ICD-10-CM | POA: Diagnosis not present

## 2022-04-17 DIAGNOSIS — J9601 Acute respiratory failure with hypoxia: Secondary | ICD-10-CM | POA: Diagnosis not present

## 2022-04-17 DIAGNOSIS — I5021 Acute systolic (congestive) heart failure: Secondary | ICD-10-CM | POA: Diagnosis not present

## 2022-04-17 DIAGNOSIS — I2609 Other pulmonary embolism with acute cor pulmonale: Secondary | ICD-10-CM | POA: Diagnosis not present

## 2022-04-17 LAB — CBC
HCT: 35.9 % — ABNORMAL LOW (ref 39.0–52.0)
Hemoglobin: 11.6 g/dL — ABNORMAL LOW (ref 13.0–17.0)
MCH: 30.6 pg (ref 26.0–34.0)
MCHC: 32.3 g/dL (ref 30.0–36.0)
MCV: 94.7 fL (ref 80.0–100.0)
Platelets: 229 10*3/uL (ref 150–400)
RBC: 3.79 MIL/uL — ABNORMAL LOW (ref 4.22–5.81)
RDW: 14.3 % (ref 11.5–15.5)
WBC: 7.2 10*3/uL (ref 4.0–10.5)
nRBC: 0 % (ref 0.0–0.2)

## 2022-04-17 LAB — BASIC METABOLIC PANEL
Anion gap: 10 (ref 5–15)
BUN: 12 mg/dL (ref 8–23)
CO2: 21 mmol/L — ABNORMAL LOW (ref 22–32)
Calcium: 9.3 mg/dL (ref 8.9–10.3)
Chloride: 108 mmol/L (ref 98–111)
Creatinine, Ser: 1.19 mg/dL (ref 0.61–1.24)
GFR, Estimated: >60 mL/min (ref >60)
Glucose, Bld: 105 mg/dL — ABNORMAL HIGH (ref 70–99)
Potassium: 4.8 mmol/L (ref 3.5–5.1)
Sodium: 139 mmol/L (ref 135–145)

## 2022-04-17 LAB — MAGNESIUM: Magnesium: 1.9 mg/dL (ref 1.7–2.4)

## 2022-04-17 MED ORDER — LOSARTAN POTASSIUM 25 MG PO TABS: 25.0000 mg | ORAL_TABLET | Freq: Every day | ORAL | Status: DC

## 2022-04-17 MED ORDER — LIDOCAINE 5 % EX PTCH
1.0000 | MEDICATED_PATCH | CUTANEOUS | Status: DC
  Administered 2022-04-17 – 2022-04-18 (×2): 1 via TRANSDERMAL
  Filled 2022-04-17 (×2): qty 1

## 2022-04-17 MED ORDER — LOSARTAN POTASSIUM 25 MG PO TABS
12.5000 mg | ORAL_TABLET | Freq: Every day | ORAL | Status: DC
  Administered 2022-04-18: 12.5 mg via ORAL
  Filled 2022-04-17: qty 1

## 2022-04-17 MED ORDER — SILDENAFIL CITRATE 20 MG PO TABS
20.0000 mg | ORAL_TABLET | Freq: Three times a day (TID) | ORAL | Status: DC
  Administered 2022-04-17 – 2022-04-18 (×2): 20 mg via ORAL
  Filled 2022-04-17 (×2): qty 1

## 2022-04-17 NOTE — Progress Notes (Signed)
Physical Therapy Treatment Patient Details Name: Bruce [name] MRN: [id number] DOB: 07-29-1945 Today's Date: 04/17/2022   History of Present Illness Pt is a 77 y.o. male who presented 04/09/22 with SOB and chest pain. Pt found to have bil PE with right heart strain and bil lower extremity DVTs. S/p PA angiogram and thrombectomy 2/5. PMH: cancer, CHF, COPD, coronary artery disease, GERD, HTN, MI, PVD    PT Comments    Patient progressing slowly towards PT goals. Session focused on progressive gait training and functional mobility. Tolerated gait training short distances with Min guard assist and use of RW for support. Limited by pain in LLE with WB and SOB. Sp02 dropped to upps 70s but mostly mid 80s on 2L/min 02 [place], not the best pleth consistently but noted to have 2-3/4 DOE with activity. Sitting BP 114/72, standing BP 98/78, standing on second attempt 110/67 with lightheadedness with each attempt. MIght benefit from additional pain meds to be able to tolerate more WB and activity with left hip. Continues to be appropriate for SNF anf highly motivated to return to independence. Will follow.     Recommendations for follow up therapy are one component of a multi-disciplinary discharge planning process, led by the attending physician.  Recommendations may be updated based on patient status, additional functional criteria and insurance authorization.  Follow Up Recommendations  Skilled nursing-short term rehab (<3 hours/day) Can patient physically be transported by private vehicle: Yes   Assistance Recommended at Discharge Intermittent Supervision/Assistance  Patient can return home with the following A little help with walking and/or transfers;A little help with bathing/dressing/bathroom;Assistance with cooking/housework;Assist for transportation;Help with stairs or ramp for entrance   Equipment Recommendations  Rolling walker (2 wheels);BSC/3in1;Other (comment)    Recommendations for Other  Services       Precautions / Restrictions Precautions Precautions: Fall;Other (comment) Precaution Comments: watch 02 and BP Restrictions Weight Bearing Restrictions: No     Mobility  Bed Mobility               General bed mobility comments: Sitting EOB upon PT arrival.    Transfers Overall transfer level: Needs assistance Equipment used: Rolling walker (2 wheels) Transfers: Sit to/from Stand Sit to Stand: Min guard           General transfer comment: MIn guard for safety. Stood from EOB x3, lightheadedness with standing.    Ambulation/Gait Ambulation/Gait assistance: Min guard Gait Distance (Feet): 16 Feet Assistive device: Rolling walker (2 wheels) Gait Pattern/deviations: Step-to pattern, Decreased stride length, Decreased step length - right, Decreased stance time - left, Decreased weight shift to left, Trunk flexed, Antalgic Gait velocity: reduced Gait velocity interpretation: <1.31 ft/sec, indicative of household ambulator   General Gait Details: Pt with slow, antalgic step-to gait pattern due to L hip pain, needing minA for stability and cues to unweight L leg by pushing through RW. Sp02 dropped to mid 80s, maybe lower (unreliable pleth) on 2L/min 02 [place].   Stairs             Wheelchair Mobility    Modified Rankin (Stroke Patients Only)       Balance Overall balance assessment: Needs assistance Sitting-balance support: No upper extremity supported, Feet supported Sitting balance-Leahy Scale: Good     Standing balance support: During functional activity Standing balance-Leahy Scale: Fair Standing balance comment: ABle to stand statically without UE support for short periods but difficulty placing full weight through LLE, Performed hip flexion marching LLE x10  Cognition Arousal/Alertness: Awake/alert Behavior During Therapy: WFL for tasks assessed/performed Overall Cognitive Status: Within Functional  Limits for tasks assessed                                 General Comments: verbose at times        Exercises Other Exercises Other Exercises: Mini squats in RW x3 working on WB through [place] comments (skin integrity, edema, etc.): Sp02 dropped to upps 70s but mostly mid 80s on 2L/min 02 [place], not the best pleth consistently. but noted to have 2-3/4 DOE with activity. Sitting BP114/72, standing BP 98/78, standing on second attempt 110/67.      Pertinent Vitals/Pain Pain Assessment Pain Assessment: Faces Faces Pain Scale: Hurts even more Pain Location: L hip with WB Pain Descriptors / Indicators: Discomfort, Grimacing, Guarding, Sore Pain Intervention(s): Monitored during session, Repositioned    Home Living                          Prior Function            PT Goals (current goals can now be found in the care plan section) Progress towards PT goals: Progressing toward goals    Frequency    Min 2X/week      PT Plan Current plan remains appropriate    Co-evaluation              AM-PAC PT "6 Clicks" Mobility   Outcome Measure  Help needed turning from your back to your side while in a flat bed without using bedrails?: A Little Help needed moving from lying on your back to sitting on the side of a flat bed without using bedrails?: A Little Help needed moving to and from a bed to a chair (including a wheelchair)?: A Little Help needed standing up from a chair using your arms (e.g., wheelchair or bedside chair)?: A Little Help needed to walk in hospital room?: A Lot Help needed climbing 3-5 steps with a railing? : Total 6 Click Score: 15    End of Session Equipment Utilized During Treatment: Oxygen;Gait belt Activity Tolerance: Patient limited by pain;Patient tolerated treatment well Patient left: in bed;with call bell/phone within reach (sitting EOB) Nurse Communication: Mobility status PT Visit Diagnosis:  Unsteadiness on feet (R26.81);Other abnormalities of gait and mobility (R26.89);Muscle weakness (generalized) (M62.81);History of falling (Z91.81);Repeated falls (R29.6);Difficulty in walking, not elsewhere classified (R26.2);Pain Pain - Right/Left: Left Pain - part of body: Hip     Time: 1011-1036 PT Time Calculation (min) (ACUTE ONLY): 25 min  Charges:  $Gait Training: 8-22 mins $Therapeutic Activity: 8-22 mins                     Marisa Severin, PT, DPT Acute Rehabilitation Services Secure chat preferred Office [place] 04/17/2022, 10:47 AM

## 2022-04-17 NOTE — Progress Notes (Addendum)
Advanced Heart Failure Rounding Note  PCP-Cardiologist: Neoma Laming, MD   Patient Profile  77 y/o homeless male with COPD ad ongoing tobacco use, CAD, systolic HF EF [id number], colon CA previous lung CA. 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 Also with a/c LE DVT.  EF also found to be down to 20-25%. RV severely reduced. 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  Overall feels breathing is better. Complaining of L hip pain from fall a couple of weeks ago. This morning had a coughing spell and felt dizzy. Resolved in about 15 mins. Has been using IS and flutter valve. Now down to 2L [place] with sats in mid 90s.  RHC 2/8 Findings:   Ao = 122/78 (96) LV = 123/6 RA = 4 RV = 77/8 LPA = 73/23 (39) RPA = 85/24 (44)  PCW = 16 Fick cardiac output/index = 4.3/2.2 PVR = 5.0  Ao sat = 90% PA sat = 51%, 50%  Objective:   Weight Range: 71.7 kg Body mass index is 20.85 kg/m.   Vital Signs:   Temp:  [97.6 F (36.4 C)-98 F (36.7 C)] 98 F (36.7 C) (02/12 0322) Pulse Rate:  [84-102] 84 (02/12 0322) Resp:  [16-29] 16 (02/12 0322) BP: (97-121)/(67-83) 116/76 (02/12 0322) SpO2:  [90 %-100 %] 93 % (02/12 0322) Weight:  [71.7 kg] 71.7 kg (02/12 0331) Last BM Date : 04/15/22  Weight change: Filed Weights   04/15/22 0304 04/16/22 0500 04/17/22 0331  Weight: 70.8 kg 70.8 kg 71.7 kg    Intake/Output:   Intake/Output Summary (Last 24 hours) at 04/17/2022 0709 Last data filed at 04/17/2022 0300 Gross per 24 hour  Intake 480 ml  Output 100 ml  Net 380 ml    Physical Exam  General:  well appearing.  No respiratory difficulty. Generalized ecchymosis.  HEENT: normal Neck: supple. JVD ~7 cm. Carotids 2+ bilat; no bruits. No lymphadenopathy or thyromegaly appreciated. Cor: PMI nondisplaced. Regular rate &  rhythm. No rubs, gallops or murmurs. Lungs: clear Abdomen: soft, nontender, nondistended. No hepatosplenomegaly. No bruits or masses. Good bowel sounds. Extremities: no cyanosis, clubbing, rash, edema.  Neuro: alert & oriented x 3, cranial nerves grossly intact. moves all 4 extremities w/o difficulty. Affect pleasant.  Telemetry   NSR 90s   EKG    No new EKG to review  Labs    CBC Recent Labs    04/16/22 0018 04/17/22 0014  WBC 7.6 7.2  HGB 11.8* 11.6*  HCT 34.9* 35.9*  MCV 91.4 94.7  PLT 219 [id number]   Basic Metabolic Panel Recent Labs    04/16/22 0018 04/17/22 0014  NA 139 139  K 3.9 4.8  CL 108 108  CO2 23 21*  GLUCOSE 129* 105*  BUN 12 12  CREATININE 1.18 1.19  CALCIUM 9.1 9.3  MG 2.0 1.9   Liver Function Tests No results for input(s): "AST", "ALT", "ALKPHOS", "BILITOT", "PROT", "ALBUMIN" in the last 72 hours.  No results for input(s): "LIPASE", "AMYLASE" in the last 72 hours. Cardiac Enzymes No results for input(s): "CKTOTAL", "CKMB", "CKMBINDEX", "TROPONINI" in the last 72 hours.  BNP: BNP (last 3 results) Recent Labs    04/09/22 1645 04/10/22 0901  BNP 537.6* 1,012.6*    ProBNP (last 3 results) No results for input(s): "PROBNP" in the last  8760 hours.   D-Dimer No results for input(s): "DDIMER" in the last 72 hours. Hemoglobin A1C No results for input(s): "HGBA1C" in the last 72 hours. Fasting Lipid Panel No results for input(s): "CHOL", "HDL", "LDLCALC", "TRIG", "CHOLHDL", "LDLDIRECT" in the last 72 hours. Thyroid Function Tests No results for input(s): "TSH", "T4TOTAL", "T3FREE", "THYROIDAB" in the last 72 hours.  Invalid input(s): "FREET3"  Other results:   Imaging    No results found.   Medications:     Scheduled Medications:  amiodarone  200 mg Oral QHS   apixaban  10 mg Oral BID   [START ON 04/21/2022] apixaban  5 mg Oral BID   digoxin  0.125 mg Oral Daily   pantoprazole  40 mg Oral Daily   rOPINIRole  1 mg Oral QHS    rosuvastatin  10 mg Oral Daily   sacubitril-valsartan  1 tablet Oral BID   sodium chloride flush  3 mL Intravenous Q12H   sodium chloride flush  3 mL Intravenous Q12H   umeclidinium-vilanterol  1 puff Inhalation Daily    Infusions:  sodium chloride      PRN Medications: sodium chloride, acetaminophen, albuterol, ALPRAZolam, docusate sodium, mouth rinse, polyethylene glycol, sodium chloride flush    Patient Profile  77 y/o homeless male with COPD ad ongoing tobacco use, CAD, systolic HF EF [id number], colon CA previous lung CA. 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 Also with a/c LE DVT.  EF also found to be down to 20-25%. RV severely reduced.  Assessment/Plan  1. 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  - S/P Mechanical Thrombectomy R Pulmonary Artery  - RHC w/ moderate to severe PAH with evidence of cor pulmonale  - Continue Eliquis treatment dose  - sildenafil 20 mg tid held 2/2 hypotension, will start again - Will need eventual VQ scan (can do outpatient in several months)   2. Acute Hypoxic Respiratory Failure - COPD at b/l + submassive PE - continues w/ supp O2 requirements, down to 2L today - tx per above    3. PAF  -Maintaining ST  -On amiodarone. ? if nausea is related to amiodarone. If nausea persist, consider stopping amiodarone. Looking back at his EKGs he has been in SR for several years.  No nausea this am -Continue Eliquis    4. Chronic HFrEF  - Echo EF down 20-25% with D shaped septum RV Failure, EF previously 40-45%.  - R/LHC c/w severe NICM. Minimal CAD, moderate to severe PAH with evidence of cor pulmonale - Continue digoxin.  - Will stop entresto to allow more BP room, switch to losartan 12.5 mg daily to start tomorrow.  - Hold spironolactone for now with intt dizziness. K 4.8.  - restarting sildenafil - consider farxiga down the road  if BP stabilizes.  - BP improving but remains intermittently dizzy. Will hold off on additional GDMT for now.   5. Lung nodule - Needs follow up CT in 3 months. Has been arranged w/ [facility]    6. Smoker  - Discussed smoking cessation.    7. [place]  - FULL Code    8. Social  - No permanent residence. [place]. SW team assisting w/ finding income based housing options  - Needs to be followed in the community. Will need Paramedicine. I will refer.  - Planning for SNF per primary   Length  of Stay: [place], NP  04/17/2022, 7:09 AM  Advanced Heart Failure Team Pager [phone] (M-F; 7a - 5p)  Please contact [facility] for night-coverage after hours (5p -7a ) and weekends on [email or web address]

## 2022-04-17 NOTE — Progress Notes (Signed)
Mobility Specialist Progress Note:   04/17/22 1525  Mobility  Activity Ambulated with assistance in room  Level of Assistance Contact guard assist, steadying assist  Assistive Device Front wheel walker  Distance Ambulated (ft) 14 ft  Activity Response Tolerated fair  Mobility Referral Yes  $Mobility charge 1 Mobility   Pt eager for mobility session. Required minG contact guard assist for safety throughout. Able to WB through LLE to take steps today. VSS on 2LO2 (when reliable pleth). Pt c/o of significant L hip/LLE pain throughout session. Left sitting EOB with all needs met, bed alarm on.  Nelta Numbers Mobility Specialist Please contact via SecureChat or  Rehab office at [phone]

## 2022-04-17 NOTE — Progress Notes (Signed)
PROGRESS NOTE    Bruce [name]  [id number] DOB: 1946-02-05 DOA: 04/09/2022 PCP: [name], NP   Brief Narrative:  77 year old male with history of paroxysmal A-fib, CAD/MI, chronic systolic heart failure, COPD, hypertension, prostate cancer, colon cancer, DVT 5 years ago, tobacco abuse presented with chest pain and dizziness.  Workup in the ED revealed submassive PE with right heart strain.  He was admitted to ICU under PCCM service and started on heparin drip.  He underwent IR thrombectomy on 04/10/2022.  Echo showed EF of 20 to 25% with global hypokinesis.  Heart failure team was consulted.  He was transferred to [facility] service from 04/12/2022 onwards.  He underwent right and left heart catheterization on 04/13/2022.  PT subsequently recommended SNF placement.  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  Started on heparin drip and admitted to ICU under PCCM service.  Underwent IR thrombectomy of right pulmonary artery on 04/10/2022. -Transferred to [facility] service from 04/12/2022 onwards -Will possibly need lifelong anticoagulation: Transitioned to Eliquis from heparin drip on 04/13/2022 -Currently on 2 L oxygen via hyponasal cannula.  Wean off as able  Acute on chronic systolic heart failure Minimal CAD Moderate to severe PAH -Echo shows EF of 20 to 25%.  Previous EF of 40 to 45%. -Strict input and output.  Daily weights.  Fluid restriction -Cardiology/heart failure team following: Currently on digoxin.  Status post left and right heart cath on 04/13/2022.  Entresto resumed on 04/16/2022 by cardiology team. Spironolactone and sildenafil held on 04/15/2022 by cardiology team because of hypotension.  Blood pressure currently improving.   Paroxysmal  A-fib -Currently mostly rate controlled.  Continue digoxin, amiodarone.  Currently on Eliquis  Hyponatremia -Improved  COPD -Currently stable.  Continue current inhaled regimen.  Left upper lobe lung nodule -Enlarging left upper lobe lung nodule.  Needs follow-up CT in 3 months and outpatient pulmonary follow-up  BPH -Continue tamsulosin  Restless leg syndrome -Continue Requip  Left hip pain Ambulatory dysfunction -Apparently fell few weeks ago on the left hip and has been having left hip pain.  No fractures noted on x-rays. -PT recommended SNF placement.  TOC consulted.  GERD -Continue Protonix  History of prostate cancer -Outpatient follow-up with urology  Anxiety -Continue as needed Xanax  DVT prophylaxis: Eliquis Code Status: Full Family Communication: None at bedside Disposition Plan: Status is: Inpatient Remains inpatient appropriate because: Of severity of illness.  Need for SNF placement  Consultants: PCCM/IR/cardiology  Procedures: As above  Antimicrobials: None   Subjective: Patient seen and examined at bedside.  Still feels intermittently lightheaded but feels a better.  Denies any chest pain, fever or vomiting.   Objective: Vitals:   04/17/22 0322 04/17/22 0331 04/17/22 0747 04/17/22 0852  BP: 116/76  121/72   Pulse: 84  99   Resp: 16  (!) 23   Temp: 98 F (36.7 C)  (!) 97.5 F (36.4 C)   TempSrc: Oral  Oral   SpO2: 93%  95% 96%  Weight:  71.7 kg    Height:        Intake/Output Summary (Last 24 hours) at 04/17/2022 0913 Last data filed at 04/17/2022 0747 Gross per 24 hour  Intake 360 ml  Output 450 ml  Net -90 ml    [employer]  04/15/22 0304 04/16/22 0500 04/17/22 0331  Weight: 70.8 kg 70.8 kg 71.7 kg    Examination:  General: No distress.  On 2 L oxygen via nasal cannula.  Chronically ill and deconditioned ENT/neck: No obvious thyromegaly or JVD elevation noted  respiratory: Decreased breath sounds at bases bilaterally  with some crackles  CVS: S1-S2 heard; rate controlled mostly  abdominal: Soft, nontender, mildly distended; no organomegaly, bowel sounds normally heard  extremities: Bilateral lower extremity edema present; no cyanosis  CNS: Awake; slow to respond.  Poor historian.  No focal neurologic deficit.  Moving extremities.   Lymph: No obvious cervical lymphadenopathy  skin: No obvious lesions/ecchymosis psych: Affect is mostly flat.  Showing no signs of agitation.   Musculoskeletal: No obvious joint erythema/tenderness   Data Reviewed: I have personally reviewed following labs and imaging studies  CBC: Recent Labs  Lab 04/13/22 0014 04/13/22 1118 04/13/22 1125 04/14/22 0019 04/15/22 0011 04/16/22 0018 04/17/22 0014  WBC 7.4  --   --  5.7 5.9 7.6 7.2  HGB 11.5*   < > 11.6*  11.9* 11.5* 11.5* 11.8* 11.6*  HCT 33.1*   < > 34.0*  35.0* 34.6* 34.6* 34.9* 35.9*  MCV 90.7  --   --  92.0 93.3 91.4 94.7  PLT 168  --   --  173 186 219 229   < > = values in this interval not displayed.    Basic Metabolic Panel: Recent Labs  Lab 04/13/22 0014 04/13/22 1118 04/13/22 1125 04/14/22 0019 04/15/22 0011 04/16/22 0018 04/17/22 0014  NA 135   < > 141  140 134* 139 139 139  K 3.5   < > 4.0  4.1 3.5 4.1 3.9 4.8  CL 106  --   --  103 107 108 108  CO2 22  --   --  22 24 23 $ 21*  GLUCOSE 102*  --   --  118* 116* 129* 105*  BUN 17  --   --  15 14 12 12  $ CREATININE 1.11  --   --  1.03 1.24 1.18 1.19  CALCIUM 8.5*  --   --  8.5* 9.0 9.1 9.3  MG 2.0  --   --  2.1 2.1 2.0 1.9   < > = values in this interval not displayed.    GFR: Estimated Creatinine Clearance: 55.2 mL/min (by C-G formula based on SCr of 1.19 mg/dL). Liver Function Tests: No results for input(s): "AST", "ALT", "ALKPHOS", "BILITOT", "PROT", "ALBUMIN" in the last 168 hours.  No results for input(s): "LIPASE", "AMYLASE" in the last 168 hours. No results for input(s): "AMMONIA" in the last 168 hours. Coagulation Profile: No  results for input(s): "INR", "PROTIME" in the last 168 hours.  Cardiac Enzymes: No results for input(s): "CKTOTAL", "CKMB", "CKMBINDEX", "TROPONINI" in the last 168 hours. BNP (last 3 results) No results for input(s): "PROBNP" in the last 8760 hours. HbA1C: No results for input(s): "HGBA1C" in the last 72 hours. CBG: No results for input(s): "GLUCAP" in the last 168 hours. Lipid Profile: No results for input(s): "CHOL", "HDL", "LDLCALC", "TRIG", "CHOLHDL", "LDLDIRECT" in the last 72 hours. Thyroid Function Tests: No results for input(s): "TSH", "T4TOTAL", "FREET4", "T3FREE", "THYROIDAB" in the last 72 hours. Anemia Panel: No results for input(s): "VITAMINB12", "FOLATE", "FERRITIN", "TIBC", "IRON", "RETICCTPCT" in the last 72 hours. Sepsis Labs: No results for input(s): "PROCALCITON", "LATICACIDVEN" in the last 168 hours.   Recent Results (from the past 240 hour(s))  Resp panel by RT-PCR (RSV, Flu A&B,  Covid) Anterior Nasal Swab     Status: None   Collection Time: 04/09/22  4:57 PM   Specimen: Anterior Nasal Swab  Result Value Ref Range Status   SARS Coronavirus 2 by RT PCR NEGATIVE NEGATIVE Final   Influenza A by PCR NEGATIVE NEGATIVE Final   Influenza B by PCR NEGATIVE NEGATIVE Final    Comment: (NOTE) The Xpert Xpress SARS-CoV-2/FLU/RSV plus assay is intended as an aid in the diagnosis of influenza from Nasopharyngeal swab specimens and should not be used as a sole basis for treatment. Nasal washings and aspirates are unacceptable for Xpert Xpress SARS-CoV-2/FLU/RSV testing.  Fact Sheet for Patients: EntrepreneurPulse.com.au  Fact Sheet for Healthcare Providers: IncredibleEmployment.be  This test is not yet approved or cleared by the [place] FDA and has been authorized for detection and/or diagnosis of SARS-CoV-2 by FDA under an Emergency Use Authorization (EUA). This EUA will remain in effect (meaning this test can be used) for  the duration of the COVID-19 declaration under Section 564(b)(1) of the Act, 21 U.S.C. section 360bbb-3(b)(1), unless the authorization is terminated or revoked.     Resp Syncytial Virus by PCR NEGATIVE NEGATIVE Final    Comment: (NOTE) Fact Sheet for Patients: EntrepreneurPulse.com.au  Fact Sheet for Healthcare Providers: IncredibleEmployment.be  This test is not yet approved or cleared by the [place] FDA and has been authorized for detection and/or diagnosis of SARS-CoV-2 by FDA under an Emergency Use Authorization (EUA). This EUA will remain in effect (meaning this test can be used) for the duration of the COVID-19 declaration under Section 564(b)(1) of the Act, 21 U.S.C. section 360bbb-3(b)(1), unless the authorization is terminated or revoked.  Performed at [facility], [place] [address], [place], [place] [address]   MRSA Next Gen by PCR, Nasal     Status: None   Collection Time: 04/10/22  1:09 AM   Specimen: Nasal Mucosa; Nasal Swab  Result Value Ref Range Status   MRSA by PCR Next Gen NOT DETECTED NOT DETECTED Final    Comment: (NOTE) The GeneXpert MRSA Assay (FDA approved for NASAL specimens only), is one component of a comprehensive MRSA colonization surveillance program. It is not intended to diagnose MRSA infection nor to guide or monitor treatment for MRSA infections. Test performance is not FDA approved in patients less than 19 years old. Performed at [facility], [place] [address] [place], [place] [address]          Radiology Studies: No results found.      Scheduled Meds:  amiodarone  200 mg Oral QHS   apixaban  10 mg Oral BID   [START ON 04/21/2022] apixaban  5 mg Oral BID   digoxin  0.125 mg Oral Daily   pantoprazole  40 mg Oral Daily   rOPINIRole  1 mg Oral QHS   rosuvastatin  10 mg Oral Daily   sacubitril-valsartan  1 tablet Oral BID   sodium chloride flush  3 mL Intravenous Q12H   sodium  chloride flush  3 mL Intravenous Q12H   umeclidinium-vilanterol  1 puff Inhalation Daily   Continuous Infusions:  sodium chloride            Aline August, MD [facility] 04/17/2022, 9:13 AM

## 2022-04-18 ENCOUNTER — Other Ambulatory Visit (HOSPITAL_COMMUNITY): Payer: Self-pay

## 2022-04-18 DIAGNOSIS — I272 Pulmonary hypertension, unspecified: Secondary | ICD-10-CM | POA: Diagnosis not present

## 2022-04-18 DIAGNOSIS — R2689 Other abnormalities of gait and mobility: Secondary | ICD-10-CM | POA: Diagnosis not present

## 2022-04-18 DIAGNOSIS — R5383 Other fatigue: Secondary | ICD-10-CM | POA: Diagnosis not present

## 2022-04-18 DIAGNOSIS — Z743 Need for continuous supervision: Secondary | ICD-10-CM | POA: Diagnosis not present

## 2022-04-18 DIAGNOSIS — J449 Chronic obstructive pulmonary disease, unspecified: Secondary | ICD-10-CM | POA: Diagnosis not present

## 2022-04-18 DIAGNOSIS — Z7401 Bed confinement status: Secondary | ICD-10-CM | POA: Diagnosis not present

## 2022-04-18 DIAGNOSIS — I5022 Chronic systolic (congestive) heart failure: Secondary | ICD-10-CM | POA: Diagnosis not present

## 2022-04-18 DIAGNOSIS — R278 Other lack of coordination: Secondary | ICD-10-CM | POA: Diagnosis not present

## 2022-04-18 DIAGNOSIS — I5021 Acute systolic (congestive) heart failure: Secondary | ICD-10-CM | POA: Diagnosis not present

## 2022-04-18 DIAGNOSIS — I2699 Other pulmonary embolism without acute cor pulmonale: Secondary | ICD-10-CM | POA: Diagnosis not present

## 2022-04-18 DIAGNOSIS — J9601 Acute respiratory failure with hypoxia: Secondary | ICD-10-CM | POA: Diagnosis not present

## 2022-04-18 DIAGNOSIS — J9691 Respiratory failure, unspecified with hypoxia: Secondary | ICD-10-CM | POA: Diagnosis not present

## 2022-04-18 DIAGNOSIS — I2609 Other pulmonary embolism with acute cor pulmonale: Secondary | ICD-10-CM | POA: Diagnosis not present

## 2022-04-18 DIAGNOSIS — M6281 Muscle weakness (generalized): Secondary | ICD-10-CM | POA: Diagnosis not present

## 2022-04-18 DIAGNOSIS — R531 Weakness: Secondary | ICD-10-CM | POA: Diagnosis not present

## 2022-04-18 DIAGNOSIS — J9690 Respiratory failure, unspecified, unspecified whether with hypoxia or hypercapnia: Secondary | ICD-10-CM | POA: Diagnosis not present

## 2022-04-18 DIAGNOSIS — R1312 Dysphagia, oropharyngeal phase: Secondary | ICD-10-CM | POA: Diagnosis not present

## 2022-04-18 LAB — MAGNESIUM: Magnesium: 1.8 mg/dL (ref 1.7–2.4)

## 2022-04-18 LAB — CBC
HCT: 34 % — ABNORMAL LOW (ref 39.0–52.0)
Hemoglobin: 11.6 g/dL — ABNORMAL LOW (ref 13.0–17.0)
MCH: 31.4 pg (ref 26.0–34.0)
MCHC: 34.1 g/dL (ref 30.0–36.0)
MCV: 91.9 fL (ref 80.0–100.0)
Platelets: 224 10*3/uL (ref 150–400)
RBC: 3.7 MIL/uL — ABNORMAL LOW (ref 4.22–5.81)
RDW: 14.3 % (ref 11.5–15.5)
WBC: 6 10*3/uL (ref 4.0–10.5)
nRBC: 0 % (ref 0.0–0.2)

## 2022-04-18 LAB — BASIC METABOLIC PANEL
Anion gap: 8 (ref 5–15)
BUN: 11 mg/dL (ref 8–23)
CO2: 23 mmol/L (ref 22–32)
Calcium: 9 mg/dL (ref 8.9–10.3)
Chloride: 106 mmol/L (ref 98–111)
Creatinine, Ser: 1.15 mg/dL (ref 0.61–1.24)
GFR, Estimated: >60 mL/min (ref >60)
Glucose, Bld: 97 mg/dL (ref 70–99)
Potassium: 4.2 mmol/L (ref 3.5–5.1)
Sodium: 137 mmol/L (ref 135–145)

## 2022-04-18 MED ORDER — SPIRONOLACTONE 25 MG PO TABS: 25.0000 mg | ORAL_TABLET | Freq: Every day | ORAL | Status: DC

## 2022-04-18 MED ORDER — SILDENAFIL CITRATE 20 MG PO TABS
20.0000 mg | ORAL_TABLET | Freq: Three times a day (TID) | ORAL | 0 refills | Status: DC
  Filled 2022-04-18: qty 90, 30d supply, fill #0

## 2022-04-18 MED ORDER — LOSARTAN POTASSIUM 25 MG PO TABS
12.5000 mg | ORAL_TABLET | Freq: Every day | ORAL | 0 refills | Status: DC
  Filled 2022-04-18: qty 30, 60d supply, fill #0

## 2022-04-18 MED ORDER — AMIODARONE HCL 200 MG PO TABS: 200.0000 mg | ORAL_TABLET | Freq: Every day | ORAL | Status: DC

## 2022-04-18 MED ORDER — FUROSEMIDE 20 MG PO TABS: 20.0000 mg | ORAL_TABLET | Freq: Every day | ORAL | Status: DC | PRN

## 2022-04-18 MED ORDER — BUDESONIDE-FORMOTEROL FUMARATE 160-4.5 MCG/ACT IN AERO
2.0000 | INHALATION_SPRAY | Freq: Two times a day (BID) | RESPIRATORY_TRACT | 0 refills | Status: DC
  Filled 2022-04-18: qty 10.2, 30d supply, fill #0

## 2022-04-18 MED ORDER — FARXIGA 10 MG PO TABS: 10.0000 mg | ORAL_TABLET | Freq: Every day | ORAL | Status: DC

## 2022-04-18 MED ORDER — LIDOCAINE 5 % EX PTCH
1.0000 | MEDICATED_PATCH | CUTANEOUS | 0 refills | Status: DC
  Filled 2022-04-18: qty 30, 30d supply, fill #0

## 2022-04-18 MED ORDER — APIXABAN 5 MG PO TABS
ORAL_TABLET | ORAL | 0 refills | Status: DC
  Filled 2022-04-18: qty 60, 27d supply, fill #0

## 2022-04-18 NOTE — Progress Notes (Signed)
Pt. Wallet returned, money (paper cash and coins) and cards gone over with patient.  Patient signed form stating everything was still there.

## 2022-04-18 NOTE — Progress Notes (Signed)
Report called to Augusto Garbe., LPN of [facility].  All questions answered.

## 2022-04-18 NOTE — Progress Notes (Signed)
Occupational Therapy Treatment Patient Details Name: Bruce [name] MRN: [id number] DOB: 05-Nov-1945 Today's Date: 04/18/2022   History of present illness Pt is a 77 y.o. male who presented 04/09/22 with SOB and chest pain. Pt found to have bil PE with right heart strain and bil lower extremity DVTs. S/p PA angiogram and thrombectomy 2/5. PMH: cancer, CHF, COPD, coronary artery disease, GERD, HTN, MI, PVD   OT comments  Pt progressing towards goals, able to complete bed mobility and transfers with min guard-min A, using RW. Pt able to ambulate short distance to sink and perform oral care seated with set up A. Pt reports he cannot is not able to put much weight through LLE due to pain. VSS on 2L O2 throughout session. Pt presenting with impairments listed below, will follow acutely. Continue to recommend SNF at d/c.   Recommendations for follow up therapy are one component of a multi-disciplinary discharge planning process, led by the attending physician.  Recommendations may be updated based on patient status, additional functional criteria and insurance authorization.    Follow Up Recommendations  Skilled nursing-short term rehab (<3 hours/day)     Assistance Recommended at Discharge Frequent or constant Supervision/Assistance  Patient can return home with the following  A lot of help with walking and/or transfers;A lot of help with bathing/dressing/bathroom;Assistance with cooking/housework;Direct supervision/assist for medications management;Direct supervision/assist for financial management;Assist for transportation;Help with stairs or ramp for entrance   Equipment Recommendations  Other (comment) (defer)    Recommendations for Other Services PT consult    Precautions / Restrictions Precautions Precautions: Fall;Other (comment) Precaution Comments: watch 02 and BP Restrictions Weight Bearing Restrictions: No RLE Weight Bearing: Weight bearing as tolerated LLE Weight Bearing: Weight  bearing as tolerated       Mobility Bed Mobility Overal bed mobility: Needs Assistance Bed Mobility: Sit to Supine     Supine to sit: Min guard Sit to supine: Min assist        Transfers Overall transfer level: Needs assistance Equipment used: Rolling walker (2 wheels) Transfers: Sit to/from Stand Sit to Stand: Min guard, Min assist                 Balance Overall balance assessment: Needs assistance Sitting-balance support: No upper extremity supported, Feet supported Sitting balance-Leahy Scale: Good     Standing balance support: During functional activity Standing balance-Leahy Scale: Fair                             ADL either performed or assessed with clinical judgement   ADL Overall ADL's : Needs assistance/impaired     Grooming: Oral care;Set up;Sitting Grooming Details (indicate cue type and reason): seated at sink                 Toilet Transfer: Minimal assistance;Ambulation;BSC/3in1;Rolling walker (2 wheels) Toilet Transfer Details (indicate cue type and reason): simulated via functional mobility         Functional mobility during ADLs: Minimal assistance;Moderate assistance;Rolling walker (2 wheels)      Extremity/Trunk Assessment Upper Extremity Assessment Upper Extremity Assessment: Generalized weakness   Lower Extremity Assessment Lower Extremity Assessment: Defer to PT evaluation        Vision   Vision Assessment?: No apparent visual deficits   Perception Perception Perception: Not tested   Praxis Praxis Praxis: Not tested    Cognition Arousal/Alertness: Awake/alert Behavior During Therapy: WFL for tasks assessed/performed Overall Cognitive Status: Within Functional Limits  for tasks assessed                                 General Comments: verbose at times        Exercises      Shoulder Instructions       General Comments VSS on 2L O2    Pertinent Vitals/ Pain       Pain  Assessment Pain Assessment: Faces Pain Score: 6  Faces Pain Scale: Hurts even more Pain Location: L hip with WB Pain Descriptors / Indicators: Discomfort, Grimacing, Guarding, Sore Pain Intervention(s): Limited activity within patient's tolerance, Monitored during session, Repositioned  Home Living                                          Prior Functioning/Environment              Frequency  Min 2X/week        Progress Toward Goals  OT Goals(current goals can now be found in the care plan section)  Progress towards OT goals: Progressing toward goals  Acute Rehab OT Goals Patient Stated Goal: none stated OT Goal Formulation: With patient Time For Goal Achievement: 04/26/22 Potential to Achieve Goals: Good ADL Goals Pt Will Perform Upper Body Dressing: with supervision;sitting Pt Will Perform Lower Body Dressing: with min assist;sitting/lateral leans;sit to/from stand Pt Will Transfer to Toilet: with min guard assist;ambulating;regular height toilet Pt Will Perform Tub/Shower Transfer: Tub transfer;Shower transfer;with min assist;ambulating  Plan Discharge plan remains appropriate;Frequency remains appropriate    Co-evaluation                 AM-PAC OT "6 Clicks" Daily Activity     Outcome Measure   Help from another person eating meals?: None Help from another person taking care of personal grooming?: A Little Help from another person toileting, which includes using toliet, bedpan, or urinal?: A Lot Help from another person bathing (including washing, rinsing, drying)?: A Lot Help from another person to put on and taking off regular upper body clothing?: A Little Help from another person to put on and taking off regular lower body clothing?: A Lot 6 Click Score: 16    End of Session Equipment Utilized During Treatment: Gait belt;Rolling walker (2 wheels);Oxygen (2L)  OT Visit Diagnosis: Unsteadiness on feet (R26.81);Other abnormalities  of gait and mobility (R26.89);Muscle weakness (generalized) (M62.81);History of falling (Z91.81)   Activity Tolerance Patient limited by pain   Patient Left in bed;with call bell/phone within reach;with bed alarm set   Nurse Communication Mobility status        Time: [id number] OT Time Calculation (min): 24 min  Charges: OT General Charges $OT Visit: 1 Visit OT Treatments $Self Care/Home Management : 8-22 mins $Therapeutic Activity: 8-22 mins  Renaye Rakers, OTD, OTR/L SecureChat Preferred Acute Rehab [phone] - [place] 04/18/2022, 10:44 AM

## 2022-04-18 NOTE — TOC Transition Note (Signed)
Transition of Care [facility]) - CM/SW Discharge Note   Patient Details  Name: Bruce [name] MRN: [id number] Date of Birth: [birth year]  Transition of Care [facility]) CM/SW Contact:  Bjorn Pippin, LCSW Phone Number: 04/18/2022, 10:42 AM   Clinical Narrative:    Patient will [facility] to: [facility] Anticipated DC date: 04/18/2022 Family notified: N/A Transport by: Corey Harold   Per MD patient ready for DC to [facility]. RN, patient and facility notified of DC. Discharge Summary and FL2 sent to facility. RN to call report prior to discharge [phone] and room number is 1203. DC packet on chart. Ambulance transport requested for patient.   CSW will sign off for now as social work intervention is no longer needed. Please consult [place] again if new needs arise.    Final next level of care: Skilled Nursing Facility Barriers to Discharge: No Barriers Identified   Patient Goals and CMS Choice      Discharge Placement                Patient chooses bed at: [facility] Patient to be transferred to facility by: [facility] Name of family member notified: N/A Patient and family notified of of transfer: 04/18/22  Discharge Plan and Services Additional resources added to the After Visit Summary for     Discharge Planning Services: CM Consult                                 Social Determinants of Health (SDOH) Interventions SDOH Screenings   Food Insecurity: No Food Insecurity (04/12/2022)  Housing: High Risk (04/12/2022)  Transportation Needs: No Transportation Needs (04/12/2022)  Utilities: Not At Risk (04/14/2022)  Financial Resource Strain: High Risk (04/12/2022)  Physical Activity: Inactive (02/05/2017)  Social Connections: Moderately Isolated (02/05/2017)  Stress: Stress Concern Present (02/05/2017)  Tobacco Use: High Risk (04/14/2022)     Readmission Risk Interventions     No data to display         Beckey Rutter, MSW, LCSWA, LCASA Transitions of Care  Clinical Social Worker  I

## 2022-04-18 NOTE — Discharge Summary (Signed)
Physician Discharge Summary  Bruce [name] DOB: February 17, 1946 DOA: 04/09/2022  PCP: [name], NP  Admit date: 04/09/2022 Discharge date: 04/18/2022  Admitted From: Home Disposition: SNF  Recommendations for Outpatient Follow-up:  Follow up with SNF provider at earliest convenience with repeat CBC/BMP in the next few days Outpatient follow-up with cardiology/heart failure clinic Follow up in ED if symptoms worsen or new appear   Home Health: No Equipment/Devices: None  Discharge Condition: Stable CODE STATUS: Full Diet recommendation: Heart healthy/fluid restriction of up to 1200 cc a day  Brief/Interim Summary: 77 year old male with history of paroxysmal A-fib, CAD/MI, chronic systolic heart failure, COPD, hypertension, prostate cancer, colon cancer, DVT 5 years ago, tobacco abuse presented with chest pain and dizziness.  Workup in the ED revealed submassive PE with right heart strain.  He was admitted to ICU under PCCM service and started on heparin drip.  He underwent IR thrombectomy on 04/10/2022.  Echo showed EF of 20 to 25% with global hypokinesis.  Heart failure team was consulted.  He was transferred to [facility] service from 04/12/2022 onwards.  He underwent right and left heart catheterization on 04/13/2022.  PT subsequently recommended SNF placement.  Heart failure team is cleared the patient for discharge.  Will discharge to SNF once bed is available.  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  Started on heparin drip and admitted to ICU under PCCM service.  Underwent IR thrombectomy of right pulmonary artery on 04/10/2022. -Transferred to [facility] service from 04/12/2022 onwards -Will possibly need lifelong anticoagulation:  Transitioned to Eliquis from heparin drip on 04/13/2022 -Currently on 2 L oxygen via nasal cannula.  Wean off as able   Acute on chronic systolic heart failure Minimal CAD Moderate to severe PAH -Echo shows EF of 20 to 25%.  Previous EF of 40 to 45%. -Strict input and output.  Daily weights.  Fluid restriction -Cardiology/heart failure team followed the patient during the hospitalization and signed off on 04/17/2022: Currently on digoxin.  Status post left and right heart cath on 04/13/2022.  Spironolactone and sildenafil held on 04/15/2022 by cardiology team because of hypotension.  Blood pressure currently improving.  Entresto discontinued on 04/17/2022 by heart failure team.  He has been started on losartan 12.5 mg daily.  Sildenafil has also been resumed.  Farxiga and spironolactone can be resumed in 48 hours if blood pressure allows as well. -Outpatient follow-up with heart failure team.  Heart failure team has cleared the patient for discharge. -Discharge to SNF once bed is available     Paroxysmal A-fib -Currently mostly rate controlled.  Continue digoxin, amiodarone.  Currently on Eliquis   Hyponatremia -Improved   COPD -Currently stable.  Continue current inhaled regimen.   Left upper lobe lung nodule -Enlarging left upper lobe lung nodule.  Needs follow-up CT in 3 months and outpatient pulmonary follow-up   BPH -Continue tamsulosin   Restless leg syndrome -Continue Requip   Left hip pain Ambulatory dysfunction -Apparently fell few weeks ago on the left hip and has been having left hip pain.  No fractures noted on x-rays. -PT recommended SNF placement.  TOC consulted.   GERD -Continue Protonix   History of prostate cancer -Outpatient follow-up with urology  BPH -Flomax on hold currently because of episodes of hypotension/lightheadedness in the  hospital.  This can be resumed if blood pressure improves.   Anxiety -Outpatient follow-up.  Discharge  Instructions   Allergies as of 04/18/2022       Reactions   Adhesive [tape] Other (See Comments)   After right leg fracture surgery, pt developed a large blister where tape was applied to his right leg. OK to use paper tape.   Imdur [isosorbide Dinitrate] Other (See Comments)   hallucinations   Singulair [montelukast Sodium] Other (See Comments)   Hallucinations        Medication List     STOP taking these medications    ALPRAZolam 0.25 MG tablet Commonly known as: XANAX   carvedilol 12.5 MG tablet Commonly known as: COREG   Entresto 49-51 MG Generic drug: sacubitril-valsartan   rivaroxaban 20 MG Tabs tablet Commonly known as: XARELTO   tamsulosin 0.4 MG Caps capsule Commonly known as: FLOMAX       TAKE these medications    albuterol 108 (90 Base) MCG/ACT inhaler Commonly known as: VENTOLIN HFA Inhale 2 puffs into the lungs every 6 (six) hours as needed for wheezing or shortness of breath.   amiodarone 200 MG tablet Commonly known as: PACERONE Take 1 tablet (200 mg total) by mouth at bedtime. Take 2 tablets by mouth twice a daily for 1 week, then 3 tablets for 1 week, then 1 tablet twice daily for 1 week until next appointment What changed:  how much to take when to take this   apixaban 5 MG Tabs tablet Commonly known as: ELIQUIS 39m BID till 04/20/2022 then 548mBID from 04/21/2022 onwards Start taking on: April 21, 2022   budesonide-formoterol 160-4.5 MCG/ACT inhaler Commonly known as: Symbicort Inhale 2 puffs into the lungs 2 (two) times daily.   cetirizine 10 MG tablet Commonly known as: ZYRTEC Take 10 mg by mouth daily.   digoxin 0.125 MG tablet Commonly known as: LANOXIN Take 0.125 mg by mouth daily.   Farxiga 10 MG Tabs tablet Generic drug: dapagliflozin propanediol Take 1 tablet (10 mg total) by mouth daily. Start taking on: April 20, 2022 What changed: These instructions start on April 20, 2022. If you are unsure what to do  until then, ask your doctor or other care provider.   FISH OIL PO Take 1,000 mg by mouth every evening.   fluticasone 50 MCG/ACT nasal spray Commonly known as: FLONASE Place 1 spray into both nostrils daily.   furosemide 20 MG tablet Commonly known as: LASIX Take 1 tablet (20 mg total) by mouth daily as needed for fluid or edema. What changed:  when to take this reasons to take this   lidocaine 5 % Commonly known as: LIDODERM Place 1 patch onto the skin daily. Remove & Discard patch within 12 hours or as directed by MD   losartan 25 MG tablet Commonly known as: COZAAR Take 0.5 tablets (12.5 mg total) by mouth daily.   multivitamin tablet Take 1 tablet by mouth daily. For Men   nitroGLYCERIN 0.4 MG SL tablet Commonly known as: NITROSTAT Place 0.4 mg under the tongue every 5 (five) minutes as needed for chest pain.   omeprazole 40 MG capsule Commonly known as: PRILOSEC Take 40 mg by mouth daily.   ranolazine 500 MG 12 hr tablet Commonly known as: RANEXA Take 500 mg by mouth 2 (two) times daily.   rOPINIRole 1 MG tablet Commonly known as: REQUIP Take 1 mg by mouth at bedtime.   rosuvastatin 10 MG tablet Commonly known as: CRESTOR  Take 10 mg by mouth daily.   sildenafil 20 MG tablet Commonly known as: REVATIO Take 1 tablet (20 mg total) by mouth 3 (three) times daily.   spironolactone 25 MG tablet Commonly known as: ALDACTONE Take 1 tablet (25 mg total) by mouth daily. Start taking on: April 20, 2022 What changed: These instructions start on April 20, 2022. If you are unsure what to do until then, ask your doctor or other care provider.               Durable Medical Equipment  (From admission, onward)           Start     Ordered   04/12/22 0711  For home use only DME Cane  Once        04/12/22 0711            Follow-up Information     [facility] Follow up.   Specialty: Cardiology Why:  05/04/22 at 10:30 AM  [facility], Entrance C   For hospital follow-up visit for heart failure. Contact information: [id number] [place] [address] [phone]               Allergies  Allergen Reactions   Adhesive [Tape] Other (See Comments)    After right leg fracture surgery, pt developed a large blister where tape was applied to his right leg. OK to use paper tape.   Imdur [Isosorbide Dinitrate] Other (See Comments)    hallucinations   Singulair [Montelukast Sodium] Other (See Comments)    Hallucinations     Consultations: PCCM/IR/cardiology   Procedures/Studies: CARDIAC CATHETERIZATION  Result Date: 04/13/2022   Prox RCA to Mid RCA lesion is 30% stenosed.   Ost LAD to Prox LAD lesion is 40% stenosed.   The left ventricular ejection fraction is less than 25% by visual estimate. Findings: Ao = 122/78 (96) LV = 123/6 RA = 4 RV = 77/8 LPA = 73/23 (39) RPA = 85/24 (44) PCW = 16 Fick cardiac output/index = 4.3/2.2 PVR = 5.0 Ao sat = 90% PA sat = 51%, 50% Assessment: 1. Minimal CAD 2. 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 Start sildeanfil and Xarelto. Glori Bickers, MD 1:18 PM  DG HIP UNILAT WITH PELVIS 2-3 VIEWS LEFT  Result Date: 04/12/2022 CLINICAL DATA:  Left hip pain after fall 2 months ago at home. EXAM: DG HIP (WITH OR WITHOUT PELVIS) 2-3V LEFT COMPARISON:  None Available. FINDINGS: KUB 05/25/2016 IMPRESSION: No fracture or dislocation. Mild degenerative changes. The left femoroacetabular joint space is maintained. Mild superolateral left acetabular and mild lateral femoral head-neck junction degenerative osteophytosis. No acute fracture or dislocation. Mild right femoroacetabular joint space narrowing. Mild right L4-5 disc space narrowing. New brachytherapy seeds overlie the prostate.  Mild vascular calcifications. Electronically Signed   By: Yvonne Kendall M.D.   On: 04/12/2022 13:56   IR THROMBECT PRIM MECH INIT (INCLU) MOD SED  Result Date: 04/10/2022 CLINICAL DATA:  Pulmonary embolism, right heart strain with syncope/near syncope EXAM: 1. Ultrasound-guided puncture of the right common femoral vein 2. Catheterization and angiography of the main pulmonary artery 3. Selective catheterization and angiography of the right pulmonary artery 4. Mechanical thrombectomy of the right pulmonary artery 5. Measurement of pulmonary arterial pressures ANESTHESIA/SEDATION: Moderate (conscious) sedation was employed during this procedure.  A total of Versed 1 mg and Fentanyl 25 mcg was administered intravenously. Moderate Sedation Time: 80 minutes. The patient's level of consciousness and vital signs were monitored continuously by radiology nursing throughout the procedure under my direct supervision. MEDICATIONS: Documented in the EMR CONTRAST:  37m OMNIPAQUE IOHEXOL 300 MG/ML  SOLN PROCEDURE: Following a full explanation of the procedure including risks and benefits, written informed consent was obtained. A timeout was performed with all members of the treatment team present. Maximum sterile barrier technique was utilized. The patient was placed supine on the exam table. The right groin was prepped and draped in the standard sterile fashion. Ultrasound was used to evaluate the right common femoral vein, which found to be patent and suitable for access. An ultrasound image was permanently stored in the electronic medical record. Using ultrasound guidance, the right common femoral vein was directly punctured using a 21 gauge micropuncture set. An 018 wire was advanced centrally, followed by serial tract dilation and placement of a 8 French sheath. Using a combination of an angled pigtail catheter and tip deflecting wire, the main pulmonary artery was catheterized. Gentle hand injection of contrast material  confirmed location within the main pulmonary artery. Suspected large filling defect in the distal right pulmonary artery was identified, consistent recent CT pulmonary angiogram findings. Main pulmonary arterial pressures were then transduced, as detailed below. Using a combination of the angled pigtail catheter and Rosen wire, the right pulmonary artery was selected. The pigtail catheter was then exchanged for an angled catheter, which was used to select the right descending interlobar pulmonary artery. Wire was then exchanged for a short tapered Amplatz wire. Over this wire, the access site was serially dilated to accommodate a 24 [place] sheath. The [place]ari 24 [place] thrombectomy catheter was then advanced into the right pulmonary artery. Mechanical thrombectomy was then performed with multiple passes. Moderate amount of chronic and subacute clot was removed. Repeat angiogram of the right artery demonstrated persistent filling defect in the right descending interlobar pulmonary artery. The suction thrombectomy catheter was then readvanced into the clot, with additional mechanical fragmentation of the chronic elements. Suction thrombectomy was repeated, with moderate improvement in patency of the right descending interlobar pulmonary artery on repeat angiography. Final PA pressures were again transduced, as detailed below. At the end of the procedure, all wires and catheters were removed. Hemostasis was achieved at the access site using a pursestring suture. The patient tolerated the procedure well without immediate complication. COMPLICATIONS: None immediate FINDINGS: Pre PA pressure: 71/23 (mean 44) mm Hg Post PA pressure: 69/23 (mean 42) mm Hg IMPRESSION: 1. Technically successful mechanical thrombectomy of the right pulmonary artery. Patient reported subjective improvement in shortness of breath following thrombectomy, with mild improvement in vital signs including reduction in heart rate and  respiratory rate. Only mild improvement in the patient's pulmonary arterial hypertension was observed, suggestive of underlying pulmonary arterial hypertension and/or chronic PE. 2. Moderate amount of chronic and subacute clot was removed. Final angiography demonstrates a significant element of chronic pulmonary embolism, particularly in the right descending interlobar pulmonary artery, which was not amenable to catheter directed therapy. Electronically Signed   By: YAlbin FellingM.D.   On: 04/10/2022 15:08   IR [place] Vasc Access Right  Result Date: 04/10/2022 CLINICAL DATA:  Pulmonary embolism, right heart strain with syncope/near syncope EXAM: 1. Ultrasound-guided puncture of the right common femoral vein 2. Catheterization and angiography of the main pulmonary artery 3. Selective catheterization and angiography of the  right pulmonary artery 4. Mechanical thrombectomy of the right pulmonary artery 5. Measurement of pulmonary arterial pressures ANESTHESIA/SEDATION: Moderate (conscious) sedation was employed during this procedure. A total of Versed 1 mg and Fentanyl 25 mcg was administered intravenously. Moderate Sedation Time: 80 minutes. The patient's level of consciousness and vital signs were monitored continuously by radiology nursing throughout the procedure under my direct supervision. MEDICATIONS: Documented in the EMR CONTRAST:  84m OMNIPAQUE IOHEXOL 300 MG/ML  SOLN PROCEDURE: Following a full explanation of the procedure including risks and benefits, written informed consent was obtained. A timeout was performed with all members of the treatment team present. Maximum sterile barrier technique was utilized. The patient was placed supine on the exam table. The right groin was prepped and draped in the standard sterile fashion. Ultrasound was used to evaluate the right common femoral vein, which found to be patent and suitable for access. An ultrasound image was permanently stored in the electronic  medical record. Using ultrasound guidance, the right common femoral vein was directly punctured using a 21 gauge micropuncture set. An 018 wire was advanced centrally, followed by serial tract dilation and placement of a 8 French sheath. Using a combination of an angled pigtail catheter and tip deflecting wire, the main pulmonary artery was catheterized. Gentle hand injection of contrast material confirmed location within the main pulmonary artery. Suspected large filling defect in the distal right pulmonary artery was identified, consistent recent CT pulmonary angiogram findings. Main pulmonary arterial pressures were then transduced, as detailed below. Using a combination of the angled pigtail catheter and Rosen wire, the right pulmonary artery was selected. The pigtail catheter was then exchanged for an angled catheter, which was used to select the right descending interlobar pulmonary artery. Wire was then exchanged for a short tapered Amplatz wire. Over this wire, the access site was serially dilated to accommodate a 24 [place] sheath. The Inari 24 [place]chanical thrombectomy catheter was then advanced into the right pulmonary artery. Mechanical thrombectomy was then performed with multiple passes. Moderate amount of chronic and subacute clot was removed. Repeat angiogram of the right artery demonstrated persistent filling defect in the right descending interlobar pulmonary artery. The suction thrombectomy catheter was then readvanced into the clot, with additional mechanical fragmentation of the chronic elements. Suction thrombectomy was repeated, with moderate improvement in patency of the right descending interlobar pulmonary artery on repeat angiography. Final PA pressures were again transduced, as detailed below. At the end of the procedure, all wires and catheters were removed. Hemostasis was achieved at the access site using a pursestring suture. The patient tolerated the procedure well without  immediate complication. COMPLICATIONS: None immediate FINDINGS: Pre PA pressure: 71/23 (mean 44) mm Hg Post PA pressure: 69/23 (mean 42) mm Hg IMPRESSION: 1. Technically successful mechanical thrombectomy of the right pulmonary artery. Patient reported subjective improvement in shortness of breath following thrombectomy, with mild improvement in vital signs including reduction in heart rate and respiratory rate. Only mild improvement in the patient's pulmonary arterial hypertension was observed, suggestive of underlying pulmonary arterial hypertension and/or chronic PE. 2. Moderate amount of chronic and subacute clot was removed. Final angiography demonstrates a significant element of chronic pulmonary embolism, particularly in the right descending interlobar pulmonary artery, which was not amenable to catheter directed therapy. Electronically Signed   By: YAlbin FellingM.D.   On: 04/10/2022 15:08   IR Angiogram Selective Each Additional Vessel  Result Date: 04/10/2022 CLINICAL DATA:  Pulmonary embolism, right heart strain with syncope/near syncope  EXAM: 1. Ultrasound-guided puncture of the right common femoral vein 2. Catheterization and angiography of the main pulmonary artery 3. Selective catheterization and angiography of the right pulmonary artery 4. Mechanical thrombectomy of the right pulmonary artery 5. Measurement of pulmonary arterial pressures ANESTHESIA/SEDATION: Moderate (conscious) sedation was employed during this procedure. A total of Versed 1 mg and Fentanyl 25 mcg was administered intravenously. Moderate Sedation Time: 80 minutes. The patient's level of consciousness and vital signs were monitored continuously by radiology nursing throughout the procedure under my direct supervision. MEDICATIONS: Documented in the EMR CONTRAST:  32m OMNIPAQUE IOHEXOL 300 MG/ML  SOLN PROCEDURE: Following a full explanation of the procedure including risks and benefits, written informed consent was obtained. A  timeout was performed with all members of the treatment team present. Maximum sterile barrier technique was utilized. The patient was placed supine on the exam table. The right groin was prepped and draped in the standard sterile fashion. Ultrasound was used to evaluate the right common femoral vein, which found to be patent and suitable for access. An ultrasound image was permanently stored in the electronic medical record. Using ultrasound guidance, the right common femoral vein was directly punctured using a 21 gauge micropuncture set. An 018 wire was advanced centrally, followed by serial tract dilation and placement of a 8 French sheath. Using a combination of an angled pigtail catheter and tip deflecting wire, the main pulmonary artery was catheterized. Gentle hand injection of contrast material confirmed location within the main pulmonary artery. Suspected large filling defect in the distal right pulmonary artery was identified, consistent recent CT pulmonary angiogram findings. Main pulmonary arterial pressures were then transduced, as detailed below. Using a combination of the angled pigtail catheter and Rosen wire, the right pulmonary artery was selected. The pigtail catheter was then exchanged for an angled catheter, which was used to select the right descending interlobar pulmonary artery. Wire was then exchanged for a short tapered Amplatz wire. Over this wire, the access site was serially dilated to accommodate a 24 [place] sheath. The Inari 24 [place]chanical thrombectomy catheter was then advanced into the right pulmonary artery. Mechanical thrombectomy was then performed with multiple passes. Moderate amount of chronic and subacute clot was removed. Repeat angiogram of the right artery demonstrated persistent filling defect in the right descending interlobar pulmonary artery. The suction thrombectomy catheter was then readvanced into the clot, with additional mechanical fragmentation of the  chronic elements. Suction thrombectomy was repeated, with moderate improvement in patency of the right descending interlobar pulmonary artery on repeat angiography. Final PA pressures were again transduced, as detailed below. At the end of the procedure, all wires and catheters were removed. Hemostasis was achieved at the access site using a pursestring suture. The patient tolerated the procedure well without immediate complication. COMPLICATIONS: None immediate FINDINGS: Pre PA pressure: 71/23 (mean 44) mm Hg Post PA pressure: 69/23 (mean 42) mm Hg IMPRESSION: 1. Technically successful mechanical thrombectomy of the right pulmonary artery. Patient reported subjective improvement in shortness of breath following thrombectomy, with mild improvement in vital signs including reduction in heart rate and respiratory rate. Only mild improvement in the patient's pulmonary arterial hypertension was observed, suggestive of underlying pulmonary arterial hypertension and/or chronic PE. 2. Moderate amount of chronic and subacute clot was removed. Final angiography demonstrates a significant element of chronic pulmonary embolism, particularly in the right descending interlobar pulmonary artery, which was not amenable to catheter directed therapy. Electronically Signed   By: YScherrie BatemanD.  On: 04/10/2022 15:08   IR Angiogram Pulmonary Right Selective  Result Date: 04/10/2022 CLINICAL DATA:  Pulmonary embolism, right heart strain with syncope/near syncope EXAM: 1. Ultrasound-guided puncture of the right common femoral vein 2. Catheterization and angiography of the main pulmonary artery 3. Selective catheterization and angiography of the right pulmonary artery 4. Mechanical thrombectomy of the right pulmonary artery 5. Measurement of pulmonary arterial pressures ANESTHESIA/SEDATION: Moderate (conscious) sedation was employed during this procedure. A total of Versed 1 mg and Fentanyl 25 mcg was administered intravenously.  Moderate Sedation Time: 80 minutes. The patient's level of consciousness and vital signs were monitored continuously by radiology nursing throughout the procedure under my direct supervision. MEDICATIONS: Documented in the EMR CONTRAST:  7m OMNIPAQUE IOHEXOL 300 MG/ML  SOLN PROCEDURE: Following a full explanation of the procedure including risks and benefits, written informed consent was obtained. A timeout was performed with all members of the treatment team present. Maximum sterile barrier technique was utilized. The patient was placed supine on the exam table. The right groin was prepped and draped in the standard sterile fashion. Ultrasound was used to evaluate the right common femoral vein, which found to be patent and suitable for access. An ultrasound image was permanently stored in the electronic medical record. Using ultrasound guidance, the right common femoral vein was directly punctured using a 21 gauge micropuncture set. An 018 wire was advanced centrally, followed by serial tract dilation and placement of a 8 French sheath. Using a combination of an angled pigtail catheter and tip deflecting wire, the main pulmonary artery was catheterized. Gentle hand injection of contrast material confirmed location within the main pulmonary artery. Suspected large filling defect in the distal right pulmonary artery was identified, consistent recent CT pulmonary angiogram findings. Main pulmonary arterial pressures were then transduced, as detailed below. Using a combination of the angled pigtail catheter and Rosen wire, the right pulmonary artery was selected. The pigtail catheter was then exchanged for an angled catheter, which was used to select the right descending interlobar pulmonary artery. Wire was then exchanged for a short tapered Amplatz wire. Over this wire, the access site was serially dilated to accommodate a 24 [place] sheath. The Inari 24 FP[place]chanical thrombectomy catheter was then  advanced into the right pulmonary artery. Mechanical thrombectomy was then performed with multiple passes. Moderate amount of chronic and subacute clot was removed. Repeat angiogram of the right artery demonstrated persistent filling defect in the right descending interlobar pulmonary artery. The suction thrombectomy catheter was then readvanced into the clot, with additional mechanical fragmentation of the chronic elements. Suction thrombectomy was repeated, with moderate improvement in patency of the right descending interlobar pulmonary artery on repeat angiography. Final PA pressures were again transduced, as detailed below. At the end of the procedure, all wires and catheters were removed. Hemostasis was achieved at the access site using a pursestring suture. The patient tolerated the procedure well without immediate complication. COMPLICATIONS: None immediate FINDINGS: Pre PA pressure: 71/23 (mean 44) mm Hg Post PA pressure: 69/23 (mean 42) mm Hg IMPRESSION: 1. Technically successful mechanical thrombectomy of the right pulmonary artery. Patient reported subjective improvement in shortness of breath following thrombectomy, with mild improvement in vital signs including reduction in heart rate and respiratory rate. Only mild improvement in the patient's pulmonary arterial hypertension was observed, suggestive of underlying pulmonary arterial hypertension and/or chronic PE. 2. Moderate amount of chronic and subacute clot was removed. 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 Electronically Signed   By: Albin Felling M.D.   On: 04/10/2022 15:08   VAS [place] LOWER EXTREMITY VENOUS (DVT)  Result Date: 04/10/2022  Lower Venous DVT Study Patient Name:  Bruce [name]  Date of Exam:   04/10/2022 Medical Rec #: [id number]       Accession #:    [id number] Date of  Birth: March 31, 1945       Patient Gender: M Patient Age:   77 years Exam Location:  [facility] Procedure:      VAS [place] LOWER EXTREMITY VENOUS (DVT) Referring Phys: Regan Lemming --------------------------------------------------------------------------------  Indications: Pulmonary embolism.  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 Anticoagulation: Xarelto prior to admission. Limitations: Poor ultrasound/tissue interface. Comparison Study: Previous exam at [facility] on 05/27/08 was positive for DVT Performing Technologist: Rogelia Rohrer RVT, RDMS  Examination Guidelines: A complete evaluation includes B-mode imaging, spectral Doppler, color Doppler, and power Doppler as needed of all accessible portions of each vessel. Bilateral testing is considered an integral part of a complete examination. Limited examinations for reoccurring indications may be performed as noted. The reflux portion of the exam is performed with the patient in reverse Trendelenburg.  +---------+---------------+---------+-----------+----------+-------------------+ RIGHT    CompressibilityPhasicitySpontaneityPropertiesThrombus Aging      +---------+---------------+---------+-----------+----------+-------------------+ CFV      Partial        No       Yes                  Acute               +---------+---------------+---------+-----------+----------+-------------------+ SFJ      Partial                                      Acute               +---------+---------------+---------+-----------+----------+-------------------+ FV Prox  None           No       No                   Age Indeterminate   +---------+---------------+---------+-----------+----------+-------------------+ FV Mid   None           No       Yes                  Age Indeterminate   +---------+---------------+---------+-----------+----------+-------------------+ FV DistalNone           No       No                   Age Indeterminate    +---------+---------------+---------+-----------+----------+-------------------+ PFV      Full                                                             +---------+---------------+---------+-----------+----------+-------------------+ POP      Partial        No       No                                       +---------+---------------+---------+-----------+----------+-------------------+  PTV      Full                                         Not well visualized +---------+---------------+---------+-----------+----------+-------------------+ PERO                                                  Not well visualized +---------+---------------+---------+-----------+----------+-------------------+   +---------+---------------+---------+-----------+----------+-----------------+ LEFT     CompressibilityPhasicitySpontaneityPropertiesThrombus Aging    +---------+---------------+---------+-----------+----------+-----------------+ CFV      Full           No       Yes                                    +---------+---------------+---------+-----------+----------+-----------------+ SFJ      Full                                                           +---------+---------------+---------+-----------+----------+-----------------+ FV Prox  Full           No       Yes                                    +---------+---------------+---------+-----------+----------+-----------------+ FV Mid   Full           No       Yes                                    +---------+---------------+---------+-----------+----------+-----------------+ FV DistalNone           No       No                   Age Indeterminate +---------+---------------+---------+-----------+----------+-----------------+ PFV      Full                                                           +---------+---------------+---------+-----------+----------+-----------------+ POP      Partial        No        Yes                  Age Indeterminate +---------+---------------+---------+-----------+----------+-----------------+ PTV      Full                                                           +---------+---------------+---------+-----------+----------+-----------------+ PERO     Full                                                           +---------+---------------+---------+-----------+----------+-----------------+  Summary: BILATERAL: -No evidence of popliteal cyst, bilaterally. RIGHT: - Findings consistent with acute deep vein thrombosis involving the right common femoral vein, and SF junction. - Findings consistent with age indeterminate deep vein thrombosis involving the right femoral vein, and right popliteal vein.  LEFT: - Findings consistent with age indeterminate deep vein thrombosis involving the left femoral vein, and left popliteal vein.  *See table(s) above for measurements and observations. Electronically signed by Servando Snare MD on 04/10/2022 at 2:39:46 PM.    Final    ECHOCARDIOGRAM COMPLETE  Result Date: 04/10/2022    ECHOCARDIOGRAM REPORT   Patient Name:   Bruce [name] Date of Exam: 04/10/2022 Medical Rec #:  [id number]      Height:       73.0 in Accession #:    [id number]     Weight:       160.9 lb Date of Birth:  10-Aug-1945      BSA:          1.962 m Patient Age:    19 years       BP:           137/92 mmHg Patient Gender: M              HR:           102 bpm. Exam Location:  Inpatient Procedure: 2D Echo Indications:    pulmonary embolus  History:        Patient has prior history of Echocardiogram examinations, most                 recent 12/31/2018. CAD, COPD, Signs/Symptoms:Shortness of Breath                 and Chest Pain; Risk Factors:Hypertension and Dyslipidemia.  Sonographer:    Johny Chess RDCS Referring Phys: Eliseo Gum, E  Sonographer Comments: Image acquisition challenging due to respiratory motion and Image acquisition challenging due to COPD.  IMPRESSIONS  1. Left ventricular ejection fraction, by estimation, is 20 to 25%. The left ventricle has severely decreased function. The left ventricle demonstrates global hypokinesis. The left ventricular internal cavity size was mildly dilated. Left ventricular diastolic parameters are consistent with Grade I diastolic dysfunction (impaired relaxation).  2. Mildly D-shaped interventricular septum suggests RV pressure/volume overload. Right ventricular systolic function is severely reduced. The right ventricular size is moderately enlarged. Tricuspid regurgitation signal is inadequate for assessing PA pressure.  3. The mitral valve is normal in structure. No evidence of mitral valve regurgitation. No evidence of mitral stenosis.  4. The tricuspid valve is abnormal. Tricuspid valve regurgitation is mild to moderate.  5. The aortic valve is tricuspid. There is mild calcification of the aortic valve. Aortic valve regurgitation is not visualized. No aortic stenosis is present.  6. The inferior vena cava is dilated in size with <50% respiratory variability, suggesting right atrial pressure of 15 mmHg. FINDINGS  Left Ventricle: Left ventricular ejection fraction, by estimation, is 20 to 25%. The left ventricle has severely decreased function. The left ventricle demonstrates global hypokinesis. The left ventricular internal cavity size was mildly dilated. There is no left ventricular hypertrophy. Left ventricular diastolic parameters are consistent with Grade I diastolic dysfunction (impaired relaxation). Right Ventricle: Mildly D-shaped interventricular septum suggests RV pressure/volume overload. The right ventricular size is moderately enlarged. No increase in right ventricular wall thickness. Right ventricular systolic function is severely reduced. Tricuspid regurgitation signal is inadequate for assessing PA pressure. Left Atrium: Left atrial size was normal  in size. Right Atrium: Right atrial size was normal in  size. Pericardium: There is no evidence of pericardial effusion. Mitral Valve: The mitral valve is normal in structure. There is mild calcification of the mitral valve leaflet(s). Mild mitral annular calcification. No evidence of mitral valve regurgitation. No evidence of mitral valve stenosis. Tricuspid Valve: The tricuspid valve is abnormal. Tricuspid valve regurgitation is mild to moderate. Aortic Valve: The aortic valve is tricuspid. There is mild calcification of the aortic valve. Aortic valve regurgitation is not visualized. No aortic stenosis is present. Pulmonic Valve: The pulmonic valve was normal in structure. Pulmonic valve regurgitation is trivial. Aorta: The aortic root is normal in size and structure. Venous: The inferior vena cava is dilated in size with less than 50% respiratory variability, suggesting right atrial pressure of 15 mmHg. IAS/Shunts: No atrial level shunt detected by color flow Doppler.  LEFT VENTRICLE PLAX 2D LVIDd:         5.60 cm LVIDs:         4.80 cm LV PW:         0.80 cm LV IVS:        0.90 cm LVOT diam:     2.00 cm LV SV:         25 LV SV Index:   13 LVOT Area:     3.14 cm  LV Volumes (MOD) LV vol d, MOD A4C: 91.7 ml LV vol s, MOD A4C: 71.2 ml LV SV MOD A4C:     91.7 ml RIGHT VENTRICLE            IVC RV Basal diam:  3.20 cm    IVC diam: 2.60 cm RV S prime:     6.96 cm/s TAPSE (M-mode): 0.8 cm LEFT ATRIUM           Index        RIGHT ATRIUM           Index LA diam:      3.30 cm 1.68 cm/m   RA Area:     18.10 cm LA Vol (A4C): 47.7 ml 24.31 ml/m  RA Volume:   58.40 ml  29.77 ml/m  AORTIC VALVE LVOT Vmax:   57.00 cm/s LVOT Vmean:  35.600 cm/s LVOT VTI:    0.081 m  AORTA Ao Root diam: 3.30 cm  SHUNTS Systemic VTI:  0.08 m Systemic Diam: 2.00 cm Dalton McleanMD Electronically signed by Franki Monte Signature Date/Time: 04/10/2022/12:58:10 PM    Final    CT Angio Chest PE W and/or Wo Contrast  Result Date: 04/09/2022 CLINICAL DATA:  Short of breath, hypoxia EXAM: CT  ANGIOGRAPHY CHEST WITH CONTRAST TECHNIQUE: Multidetector CT imaging of the chest was performed using the standard protocol during bolus administration of intravenous contrast. Multiplanar CT image reconstructions and MIPs were obtained to evaluate the vascular anatomy. RADIATION DOSE REDUCTION: This exam was performed according to the departmental dose-optimization program which includes automated exposure control, adjustment of the mA and/or kV according to patient size and/or use of iterative reconstruction technique. CONTRAST:  48m OMNIPAQUE IOHEXOL 350 MG/ML SOLN COMPARISON:  04/09/2022, 05/02/2020 FINDINGS: Cardiovascular: This is a technically adequate evaluation of the pulmonary vasculature. There are bilateral central and segmental pulmonary emboli, with large clot burden primarily within the right main pulmonary artery. There is evidence of right heart strain, with the RV/LV ratio measuring 1.16. No pericardial effusion. Normal caliber of the thoracic aorta. Atherosclerosis of the aorta and coronary vasculature. Mediastinum/Nodes: No enlarged mediastinal, hilar, or axillary  lymph nodes. Thyroid gland, trachea, and esophagus demonstrate no significant findings. Lungs/Pleura: Background emphysema and scattered areas of scarring are stable. No acute airspace disease, effusion, or pneumothorax. Central airways are patent. 6 mm ground-glass nodule is again seen within the left lobe reference image 88/6, increased since prior study where this had measured approximately 4 mm by my measurement. No new pulmonary nodules or masses. Upper Abdomen: No acute abnormality. Musculoskeletal: No acute or destructive bony lesions. Reconstructed images demonstrate no additional findings. Review of the MIP images confirms the above findings. IMPRESSION: 1. Extensive bilateral central and segmental pulmonary emboli, with CT evidence of right heart strain (RV/LV Ratio = 1.16) consistent with at least submassive (intermediate  risk) PE. The presence of right heart strain has been associated with an increased risk of morbidity and mortality. Please refer to the "Code PE Focused" order set in EPIC. 2. 6 mm left upper lobe ground-glass nodule, increased since prior exam from 2 years ago where this had measured approximately 4 mm by my measurement. Adenocarcinoma cannot be excluded given increase in size, and thoracic surgery consultation could be considered. If this area is not resected, CT chest follow-up in 2 years is recommended. This recommendation follows the consensus statement: Guidelines for Management of Incidental Pulmonary Nodules Detected on CT Images: From the Fleischner Society 2017; Radiology 2017; 284:228-243. 3. Aortic Atherosclerosis (ICD10-I70.0) and Emphysema (ICD10-J43.9). Critical Value/emergent results were called by telephone at the time of interpretation on 04/09/2022 at 8:20 pm to provider [facility] , who verbally acknowledged these results. Electronically Signed   By: Randa Ngo M.D.   On: 04/09/2022 20:25   DG Chest Portable 1 View  Result Date: 04/09/2022 CLINICAL DATA:  Short of breath EXAM: PORTABLE CHEST 1 VIEW COMPARISON:  03/11/2021 FINDINGS: Two frontal views of the chest demonstrate a stable cardiac silhouette. Chronic prominence of the central pulmonary vasculature. No acute airspace disease, effusion, or pneumothorax. Mild background scarring unchanged. No acute bony abnormality. IMPRESSION: 1. No acute intrathoracic process. Electronically Signed   By: Randa Ngo M.D.   On: 04/09/2022 17:21      Subjective: Patient seen and examined at bedside.  Feels okay to be discharged today.  Still short of breath with exertion.  Denies any chest pain, nausea, vomiting.  Discharge Exam: Vitals:   04/17/22 2302 04/18/22 0441  BP: 108/63 114/69  Pulse: 82 85  Resp: 20 16  Temp: 97.6 F (36.4 C) 97.6 F (36.4 C)  SpO2: 96% 96%    General: Pt is alert, awake, not in acute distress. On 2  L oxygen via nasal cannula.  Looks chronically ill and deconditioned. Cardiovascular: rate controlled, S1/S2 + Respiratory: bilateral decreased breath sounds at bases with scattered crackles Abdominal: Soft, NT, ND, bowel sounds + Extremities: Trace lower extremity edema; no cyanosis    The results of significant diagnostics from this hospitalization (including imaging, microbiology, ancillary and laboratory) are listed below for reference.     Microbiology: Recent Results (from the past 240 hour(s))  Resp panel by RT-PCR (RSV, Flu A&B, Covid) Anterior Nasal Swab     Status: None   Collection Time: 04/09/22  4:57 PM   Specimen: Anterior Nasal Swab  Result Value Ref Range Status   SARS Coronavirus 2 by RT PCR NEGATIVE NEGATIVE Final   Influenza A by PCR NEGATIVE NEGATIVE Final   Influenza B by PCR NEGATIVE NEGATIVE Final    Comment: (NOTE) The Xpert Xpress SARS-CoV-2/FLU/RSV plus assay is intended as an aid in the  diagnosis of influenza from Nasopharyngeal swab specimens and should not be used as a sole basis for treatment. Nasal washings and aspirates are unacceptable for Xpert Xpress SARS-CoV-2/FLU/RSV testing.  Fact Sheet for Patients: EntrepreneurPulse.com.au  Fact Sheet for Healthcare Providers: IncredibleEmployment.be  This test is not yet approved or cleared by the [place] FDA and has been authorized for detection and/or diagnosis of SARS-CoV-2 by FDA under an Emergency Use Authorization (EUA). This EUA will remain in effect (meaning this test can be used) for the duration of the COVID-19 declaration under Section 564(b)(1) of the Act, 21 U.S.C. section 360bbb-3(b)(1), unless the authorization is terminated or revoked.     Resp Syncytial Virus by PCR NEGATIVE NEGATIVE Final    Comment: (NOTE) Fact Sheet for Patients: EntrepreneurPulse.com.au  Fact Sheet for Healthcare  Providers: IncredibleEmployment.be  This test is not yet approved or cleared by the [place] FDA and has been authorized for detection and/or diagnosis of SARS-CoV-2 by FDA under an Emergency Use Authorization (EUA). This EUA will remain in effect (meaning this test can be used) for the duration of the COVID-19 declaration under Section 564(b)(1) of the Act, 21 U.S.C. section 360bbb-3(b)(1), unless the authorization is terminated or revoked.  Performed at [facility], [place] [address], [place], [place] [address]   MRSA Next Gen by PCR, Nasal     Status: None   Collection Time: 04/10/22  1:09 AM   Specimen: Nasal Mucosa; Nasal Swab  Result Value Ref Range Status   MRSA by PCR Next Gen NOT DETECTED NOT DETECTED Final    Comment: (NOTE) The GeneXpert MRSA Assay (FDA approved for NASAL specimens only), is one component of a comprehensive MRSA colonization surveillance program. It is not intended to diagnose MRSA infection nor to guide or monitor treatment for MRSA infections. Test performance is not FDA approved in patients less than 18 years old. Performed at [facility], [place] [address] [place], [place] [address]      Labs: BNP (last 3 results) Recent Labs    04/09/22 1645 04/10/22 0901  BNP 537.6* 123XX123*   Basic Metabolic Panel: Recent Labs  Lab 04/14/22 0019 04/15/22 0011 04/16/22 0018 04/17/22 0014 04/18/22 0001  NA 134* 139 139 139 137  K 3.5 4.1 3.9 4.8 4.2  CL 103 107 108 108 106  CO2 22 24 23 $ 21* 23  GLUCOSE 118* 116* 129* 105* 97  BUN 15 14 12 12 11  $ CREATININE 1.03 1.24 1.18 1.19 1.15  CALCIUM 8.5* 9.0 9.1 9.3 9.0  MG 2.1 2.1 2.0 1.9 1.8   Liver Function Tests: No results for input(s): "AST", "ALT", "ALKPHOS", "BILITOT", "PROT", "ALBUMIN" in the last 168 hours. No results for input(s): "LIPASE", "AMYLASE" in the last 168 hours. No results for input(s): "AMMONIA" in the last 168 hours. CBC: Recent Labs  Lab  04/14/22 0019 04/15/22 0011 04/16/22 0018 04/17/22 0014 04/18/22 0001  WBC 5.7 5.9 7.6 7.2 6.0  HGB 11.5* 11.5* 11.8* 11.6* 11.6*  HCT 34.6* 34.6* 34.9* 35.9* 34.0*  MCV 92.0 93.3 91.4 94.7 91.9  PLT 173 186 219 229 224   Cardiac Enzymes: No results for input(s): "CKTOTAL", "CKMB", "CKMBINDEX", "TROPONINI" in the last 168 hours. BNP: Invalid input(s): "POCBNP" CBG: No results for input(s): "GLUCAP" in the last 168 hours. D-Dimer No results for input(s): "DDIMER" in the last 72 hours. Hgb A1c No results for input(s): "HGBA1C" in the last 72 hours. Lipid Profile No results for input(s): "CHOL", "HDL", "LDLCALC", "TRIG", "CHOLHDL", "  LDLDIRECT" in the last 72 hours. Thyroid function studies No results for input(s): "TSH", "T4TOTAL", "T3FREE", "THYROIDAB" in the last 72 hours.  Invalid input(s): "FREET3" Anemia work up No results for input(s): "VITAMINB12", "FOLATE", "FERRITIN", "TIBC", "IRON", "RETICCTPCT" in the last 72 hours. Urinalysis    Component Value Date/Time   COLORURINE YELLOW 12/30/2018 2015   APPEARANCEUR CLEAR 12/30/2018 2015   APPEARANCEUR Clear 04/10/2016 1035   LABSPEC 1.020 12/30/2018 2015   PHURINE 5.5 12/30/2018 2015   GLUCOSEU NEGATIVE 12/30/2018 2015   HGBUR TRACE (A) 12/30/2018 2015   BILIRUBINUR NEGATIVE 12/30/2018 2015   BILIRUBINUR Negative 04/10/2016 [place] 12/30/2018 2015   PROTEINUR NEGATIVE 12/30/2018 2015   NITRITE NEGATIVE 12/30/2018 2015   LEUKOCYTESUR NEGATIVE 12/30/2018 2015   Sepsis Labs Recent Labs  Lab 04/15/22 0011 04/16/22 0018 04/17/22 0014 04/18/22 0001  WBC 5.9 7.6 7.2 6.0   Microbiology Recent Results (from the past 240 hour(s))  Resp panel by RT-PCR (RSV, Flu A&B, Covid) Anterior Nasal Swab     Status: None   Collection Time: 04/09/22  4:57 PM   Specimen: Anterior Nasal Swab  Result Value Ref Range Status   SARS Coronavirus 2 by RT PCR NEGATIVE NEGATIVE Final   Influenza A by PCR NEGATIVE  NEGATIVE Final   Influenza B by PCR NEGATIVE NEGATIVE Final    Comment: (NOTE) The Xpert Xpress SARS-CoV-2/FLU/RSV plus assay is intended as an aid in the diagnosis of influenza from Nasopharyngeal swab specimens and should not be used as a sole basis for treatment. Nasal washings and aspirates are unacceptable for Xpert Xpress SARS-CoV-2/FLU/RSV testing.  Fact Sheet for Patients: EntrepreneurPulse.com.au  Fact Sheet for Healthcare Providers: IncredibleEmployment.be  This test is not yet approved or cleared by the [place] FDA and has been authorized for detection and/or diagnosis of SARS-CoV-2 by FDA under an Emergency Use Authorization (EUA). This EUA will remain in effect (meaning this test can be used) for the duration of the COVID-19 declaration under Section 564(b)(1) of the Act, 21 U.S.C. section 360bbb-3(b)(1), unless the authorization is terminated or revoked.     Resp Syncytial Virus by PCR NEGATIVE NEGATIVE Final    Comment: (NOTE) Fact Sheet for Patients: EntrepreneurPulse.com.au  Fact Sheet for Healthcare Providers: IncredibleEmployment.be  This test is not yet approved or cleared by the [place] FDA and has been authorized for detection and/or diagnosis of SARS-CoV-2 by FDA under an Emergency Use Authorization (EUA). This EUA will remain in effect (meaning this test can be used) for the duration of the COVID-19 declaration under Section 564(b)(1) of the Act, 21 U.S.C. section 360bbb-3(b)(1), unless the authorization is terminated or revoked.  Performed at [facility], [place] [address] [place], [place] [address]   MRSA Next Gen by PCR, Nasal     Status: None   Collection Time: 04/10/22  1:09 AM   Specimen: Nasal Mucosa; Nasal Swab  Result Value Ref Range Status   MRSA by PCR Next Gen NOT DETECTED NOT DETECTED Final    Comment: (NOTE) The GeneXpert MRSA Assay (FDA  approved for NASAL specimens only), is one component of a comprehensive MRSA colonization surveillance program. It is not intended to diagnose MRSA infection nor to guide or monitor treatment for MRSA infections. Test performance is not FDA approved in patients less than 77 years old. Performed at [facility], [place] [address] [place], [place] [address]      Time coordinating discharge: 35 minutes  SIGNED:   Beauty Pless  Starla Link, MD  [facility] 04/18/2022, 7:40 AM

## 2022-04-19 DIAGNOSIS — M6281 Muscle weakness (generalized): Secondary | ICD-10-CM | POA: Diagnosis not present

## 2022-04-19 DIAGNOSIS — Z86711 Personal history of pulmonary embolism: Secondary | ICD-10-CM | POA: Diagnosis not present

## 2022-04-19 DIAGNOSIS — I82403 Acute embolism and thrombosis of unspecified deep veins of lower extremity, bilateral: Secondary | ICD-10-CM | POA: Diagnosis not present

## 2022-04-19 DIAGNOSIS — J9600 Acute respiratory failure, unspecified whether with hypoxia or hypercapnia: Secondary | ICD-10-CM | POA: Diagnosis not present

## 2022-04-19 DIAGNOSIS — I5023 Acute on chronic systolic (congestive) heart failure: Secondary | ICD-10-CM | POA: Diagnosis not present

## 2022-04-19 DIAGNOSIS — M25552 Pain in left hip: Secondary | ICD-10-CM | POA: Diagnosis not present

## 2022-04-19 DIAGNOSIS — J441 Chronic obstructive pulmonary disease with (acute) exacerbation: Secondary | ICD-10-CM | POA: Diagnosis not present

## 2022-04-19 DIAGNOSIS — E871 Hypo-osmolality and hyponatremia: Secondary | ICD-10-CM | POA: Diagnosis not present

## 2022-04-19 DIAGNOSIS — I10 Essential (primary) hypertension: Secondary | ICD-10-CM | POA: Diagnosis not present

## 2022-04-19 DIAGNOSIS — I480 Paroxysmal atrial fibrillation: Secondary | ICD-10-CM | POA: Diagnosis not present

## 2022-04-20 DIAGNOSIS — I10 Essential (primary) hypertension: Secondary | ICD-10-CM | POA: Diagnosis not present

## 2022-04-21 ENCOUNTER — Ambulatory Visit: Payer: 59 | Admitting: Cardiovascular Disease

## 2022-04-21 DIAGNOSIS — M6281 Muscle weakness (generalized): Secondary | ICD-10-CM | POA: Diagnosis not present

## 2022-04-21 DIAGNOSIS — J9691 Respiratory failure, unspecified with hypoxia: Secondary | ICD-10-CM | POA: Diagnosis not present

## 2022-04-21 DIAGNOSIS — J449 Chronic obstructive pulmonary disease, unspecified: Secondary | ICD-10-CM | POA: Diagnosis not present

## 2022-04-21 DIAGNOSIS — J9600 Acute respiratory failure, unspecified whether with hypoxia or hypercapnia: Secondary | ICD-10-CM | POA: Diagnosis not present

## 2022-04-21 DIAGNOSIS — I10 Essential (primary) hypertension: Secondary | ICD-10-CM | POA: Diagnosis not present

## 2022-04-21 DIAGNOSIS — Z86711 Personal history of pulmonary embolism: Secondary | ICD-10-CM | POA: Diagnosis not present

## 2022-04-21 DIAGNOSIS — I82403 Acute embolism and thrombosis of unspecified deep veins of lower extremity, bilateral: Secondary | ICD-10-CM | POA: Diagnosis not present

## 2022-04-21 DIAGNOSIS — I5022 Chronic systolic (congestive) heart failure: Secondary | ICD-10-CM | POA: Diagnosis not present

## 2022-04-21 DIAGNOSIS — I5023 Acute on chronic systolic (congestive) heart failure: Secondary | ICD-10-CM | POA: Diagnosis not present

## 2022-04-21 DIAGNOSIS — M25552 Pain in left hip: Secondary | ICD-10-CM | POA: Diagnosis not present

## 2022-04-21 DIAGNOSIS — R2689 Other abnormalities of gait and mobility: Secondary | ICD-10-CM | POA: Diagnosis not present

## 2022-04-21 DIAGNOSIS — J441 Chronic obstructive pulmonary disease with (acute) exacerbation: Secondary | ICD-10-CM | POA: Diagnosis not present

## 2022-04-21 DIAGNOSIS — I2699 Other pulmonary embolism without acute cor pulmonale: Secondary | ICD-10-CM | POA: Diagnosis not present

## 2022-04-21 DIAGNOSIS — B370 Candidal stomatitis: Secondary | ICD-10-CM | POA: Diagnosis not present

## 2022-04-21 DIAGNOSIS — E871 Hypo-osmolality and hyponatremia: Secondary | ICD-10-CM | POA: Diagnosis not present

## 2022-04-21 DIAGNOSIS — I480 Paroxysmal atrial fibrillation: Secondary | ICD-10-CM | POA: Diagnosis not present

## 2022-04-22 DIAGNOSIS — R051 Acute cough: Secondary | ICD-10-CM | POA: Diagnosis not present

## 2022-04-25 DIAGNOSIS — R2689 Other abnormalities of gait and mobility: Secondary | ICD-10-CM | POA: Diagnosis not present

## 2022-04-25 DIAGNOSIS — I2699 Other pulmonary embolism without acute cor pulmonale: Secondary | ICD-10-CM | POA: Diagnosis not present

## 2022-04-25 DIAGNOSIS — J9600 Acute respiratory failure, unspecified whether with hypoxia or hypercapnia: Secondary | ICD-10-CM | POA: Diagnosis not present

## 2022-04-25 DIAGNOSIS — I82403 Acute embolism and thrombosis of unspecified deep veins of lower extremity, bilateral: Secondary | ICD-10-CM | POA: Diagnosis not present

## 2022-04-25 DIAGNOSIS — Z86711 Personal history of pulmonary embolism: Secondary | ICD-10-CM | POA: Diagnosis not present

## 2022-04-25 DIAGNOSIS — I5022 Chronic systolic (congestive) heart failure: Secondary | ICD-10-CM | POA: Diagnosis not present

## 2022-04-25 DIAGNOSIS — J449 Chronic obstructive pulmonary disease, unspecified: Secondary | ICD-10-CM | POA: Diagnosis not present

## 2022-04-25 DIAGNOSIS — E871 Hypo-osmolality and hyponatremia: Secondary | ICD-10-CM | POA: Diagnosis not present

## 2022-04-25 DIAGNOSIS — J441 Chronic obstructive pulmonary disease with (acute) exacerbation: Secondary | ICD-10-CM | POA: Diagnosis not present

## 2022-04-25 DIAGNOSIS — M6281 Muscle weakness (generalized): Secondary | ICD-10-CM | POA: Diagnosis not present

## 2022-04-25 DIAGNOSIS — I5023 Acute on chronic systolic (congestive) heart failure: Secondary | ICD-10-CM | POA: Diagnosis not present

## 2022-04-25 DIAGNOSIS — J9691 Respiratory failure, unspecified with hypoxia: Secondary | ICD-10-CM | POA: Diagnosis not present

## 2022-04-25 DIAGNOSIS — I480 Paroxysmal atrial fibrillation: Secondary | ICD-10-CM | POA: Diagnosis not present

## 2022-04-25 DIAGNOSIS — M25552 Pain in left hip: Secondary | ICD-10-CM | POA: Diagnosis not present

## 2022-04-25 DIAGNOSIS — I10 Essential (primary) hypertension: Secondary | ICD-10-CM | POA: Diagnosis not present

## 2022-04-26 DIAGNOSIS — Z86711 Personal history of pulmonary embolism: Secondary | ICD-10-CM | POA: Diagnosis not present

## 2022-04-26 DIAGNOSIS — I5023 Acute on chronic systolic (congestive) heart failure: Secondary | ICD-10-CM | POA: Diagnosis not present

## 2022-04-26 DIAGNOSIS — J9600 Acute respiratory failure, unspecified whether with hypoxia or hypercapnia: Secondary | ICD-10-CM | POA: Diagnosis not present

## 2022-04-26 DIAGNOSIS — I10 Essential (primary) hypertension: Secondary | ICD-10-CM | POA: Diagnosis not present

## 2022-04-26 DIAGNOSIS — B370 Candidal stomatitis: Secondary | ICD-10-CM | POA: Diagnosis not present

## 2022-04-26 DIAGNOSIS — J441 Chronic obstructive pulmonary disease with (acute) exacerbation: Secondary | ICD-10-CM | POA: Diagnosis not present

## 2022-04-26 DIAGNOSIS — E871 Hypo-osmolality and hyponatremia: Secondary | ICD-10-CM | POA: Diagnosis not present

## 2022-04-26 DIAGNOSIS — I82403 Acute embolism and thrombosis of unspecified deep veins of lower extremity, bilateral: Secondary | ICD-10-CM | POA: Diagnosis not present

## 2022-04-26 DIAGNOSIS — M25552 Pain in left hip: Secondary | ICD-10-CM | POA: Diagnosis not present

## 2022-04-26 DIAGNOSIS — M6281 Muscle weakness (generalized): Secondary | ICD-10-CM | POA: Diagnosis not present

## 2022-04-26 DIAGNOSIS — I480 Paroxysmal atrial fibrillation: Secondary | ICD-10-CM | POA: Diagnosis not present

## 2022-04-28 DIAGNOSIS — J9600 Acute respiratory failure, unspecified whether with hypoxia or hypercapnia: Secondary | ICD-10-CM | POA: Diagnosis not present

## 2022-04-28 DIAGNOSIS — I5023 Acute on chronic systolic (congestive) heart failure: Secondary | ICD-10-CM | POA: Diagnosis not present

## 2022-04-28 DIAGNOSIS — R2689 Other abnormalities of gait and mobility: Secondary | ICD-10-CM | POA: Diagnosis not present

## 2022-04-28 DIAGNOSIS — J441 Chronic obstructive pulmonary disease with (acute) exacerbation: Secondary | ICD-10-CM | POA: Diagnosis not present

## 2022-04-28 DIAGNOSIS — E871 Hypo-osmolality and hyponatremia: Secondary | ICD-10-CM | POA: Diagnosis not present

## 2022-04-28 DIAGNOSIS — I5022 Chronic systolic (congestive) heart failure: Secondary | ICD-10-CM | POA: Diagnosis not present

## 2022-04-28 DIAGNOSIS — M6281 Muscle weakness (generalized): Secondary | ICD-10-CM | POA: Diagnosis not present

## 2022-04-28 DIAGNOSIS — I10 Essential (primary) hypertension: Secondary | ICD-10-CM | POA: Diagnosis not present

## 2022-04-28 DIAGNOSIS — I82403 Acute embolism and thrombosis of unspecified deep veins of lower extremity, bilateral: Secondary | ICD-10-CM | POA: Diagnosis not present

## 2022-04-28 DIAGNOSIS — I2699 Other pulmonary embolism without acute cor pulmonale: Secondary | ICD-10-CM | POA: Diagnosis not present

## 2022-04-28 DIAGNOSIS — Z86711 Personal history of pulmonary embolism: Secondary | ICD-10-CM | POA: Diagnosis not present

## 2022-04-28 DIAGNOSIS — I480 Paroxysmal atrial fibrillation: Secondary | ICD-10-CM | POA: Diagnosis not present

## 2022-04-28 DIAGNOSIS — J449 Chronic obstructive pulmonary disease, unspecified: Secondary | ICD-10-CM | POA: Diagnosis not present

## 2022-04-28 DIAGNOSIS — M25552 Pain in left hip: Secondary | ICD-10-CM | POA: Diagnosis not present

## 2022-04-28 DIAGNOSIS — J9691 Respiratory failure, unspecified with hypoxia: Secondary | ICD-10-CM | POA: Diagnosis not present

## 2022-05-01 DIAGNOSIS — I480 Paroxysmal atrial fibrillation: Secondary | ICD-10-CM | POA: Diagnosis not present

## 2022-05-01 DIAGNOSIS — I82403 Acute embolism and thrombosis of unspecified deep veins of lower extremity, bilateral: Secondary | ICD-10-CM | POA: Diagnosis not present

## 2022-05-01 DIAGNOSIS — M25552 Pain in left hip: Secondary | ICD-10-CM | POA: Diagnosis not present

## 2022-05-01 DIAGNOSIS — Z86711 Personal history of pulmonary embolism: Secondary | ICD-10-CM | POA: Diagnosis not present

## 2022-05-01 DIAGNOSIS — M6281 Muscle weakness (generalized): Secondary | ICD-10-CM | POA: Diagnosis not present

## 2022-05-01 DIAGNOSIS — E871 Hypo-osmolality and hyponatremia: Secondary | ICD-10-CM | POA: Diagnosis not present

## 2022-05-01 DIAGNOSIS — I5023 Acute on chronic systolic (congestive) heart failure: Secondary | ICD-10-CM | POA: Diagnosis not present

## 2022-05-01 DIAGNOSIS — J9600 Acute respiratory failure, unspecified whether with hypoxia or hypercapnia: Secondary | ICD-10-CM | POA: Diagnosis not present

## 2022-05-01 DIAGNOSIS — I10 Essential (primary) hypertension: Secondary | ICD-10-CM | POA: Diagnosis not present

## 2022-05-01 DIAGNOSIS — J441 Chronic obstructive pulmonary disease with (acute) exacerbation: Secondary | ICD-10-CM | POA: Diagnosis not present

## 2022-05-02 ENCOUNTER — Other Ambulatory Visit: Payer: Self-pay | Admitting: *Deleted

## 2022-05-02 DIAGNOSIS — M6281 Muscle weakness (generalized): Secondary | ICD-10-CM | POA: Diagnosis not present

## 2022-05-02 DIAGNOSIS — R2689 Other abnormalities of gait and mobility: Secondary | ICD-10-CM | POA: Diagnosis not present

## 2022-05-02 DIAGNOSIS — I5022 Chronic systolic (congestive) heart failure: Secondary | ICD-10-CM | POA: Diagnosis not present

## 2022-05-02 DIAGNOSIS — J449 Chronic obstructive pulmonary disease, unspecified: Secondary | ICD-10-CM | POA: Diagnosis not present

## 2022-05-02 DIAGNOSIS — I2699 Other pulmonary embolism without acute cor pulmonale: Secondary | ICD-10-CM | POA: Diagnosis not present

## 2022-05-02 DIAGNOSIS — J9691 Respiratory failure, unspecified with hypoxia: Secondary | ICD-10-CM | POA: Diagnosis not present

## 2022-05-02 NOTE — Patient Outreach (Signed)
Per [name] resides in [facility]. Screening for potential [facility] care coordination services as benefit of health plan and PCP.  Secure communication sent and message left for [name], [name] social worker to inquire about transition plans/arrangements.   Telephone call made to Bruce [name]. No answer. HIPAA compliant voicemail message left requesting return call.   Will continue to follow.   Marthenia Rolling, MSN, RN,BSN [facility] Coordinator [phone] (Direct dial)

## 2022-05-03 DIAGNOSIS — M6281 Muscle weakness (generalized): Secondary | ICD-10-CM | POA: Diagnosis not present

## 2022-05-03 DIAGNOSIS — J9600 Acute respiratory failure, unspecified whether with hypoxia or hypercapnia: Secondary | ICD-10-CM | POA: Diagnosis not present

## 2022-05-03 DIAGNOSIS — I5023 Acute on chronic systolic (congestive) heart failure: Secondary | ICD-10-CM | POA: Diagnosis not present

## 2022-05-03 DIAGNOSIS — I480 Paroxysmal atrial fibrillation: Secondary | ICD-10-CM | POA: Diagnosis not present

## 2022-05-03 DIAGNOSIS — I82403 Acute embolism and thrombosis of unspecified deep veins of lower extremity, bilateral: Secondary | ICD-10-CM | POA: Diagnosis not present

## 2022-05-03 DIAGNOSIS — I10 Essential (primary) hypertension: Secondary | ICD-10-CM | POA: Diagnosis not present

## 2022-05-03 DIAGNOSIS — M25552 Pain in left hip: Secondary | ICD-10-CM | POA: Diagnosis not present

## 2022-05-03 DIAGNOSIS — J441 Chronic obstructive pulmonary disease with (acute) exacerbation: Secondary | ICD-10-CM | POA: Diagnosis not present

## 2022-05-03 DIAGNOSIS — E871 Hypo-osmolality and hyponatremia: Secondary | ICD-10-CM | POA: Diagnosis not present

## 2022-05-03 DIAGNOSIS — Z86711 Personal history of pulmonary embolism: Secondary | ICD-10-CM | POA: Diagnosis not present

## 2022-05-03 NOTE — Progress Notes (Signed)
PCP: [name] NP Primary Cardiologist: Dr Haroldine Laws   HPI: Bruce [name] is a 77 y/o with COPD ad ongoing tobacco use, CAD, systolic HF EF [id number], colon CA previous lung CA, prostate cancer, PE, DVT, and HFrEF.   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 Missed several doses of xarelto prior to admit. Also had a/c LE DVT.  EF also found to be down to 20-25%. RV severely reduced. Cath minimal CAD, moderate to severe PAH with evidence of cor pulmonale (LPAP > RPAP - checked multiple times). GDMT limited by BP. Entresto lowered BP to much so he was switched to losartan. Discharged 04/18/22 to SNF. Plans to discharge to home 05/05/22.   Today he returns for HF follow up.Overall feeling ok. Remains SOB with exertion but feels ok as long as he walks slowly. Remains on 3 liters [place]. Denies PND/Orthopnea. Appetite ok. No fever or chills. Weight at home 160-162  pounds. Taking all medications provided at SNF. Plans to discharge to home tomorrow. He plans to live with a friend for a while. He has a truck  but not currenlty driving.   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    [facility] 04/13/2022 Ao = 122/78 (96) LV = 123/6 RA = 4 RV = 77/8 LPA = 73/23 (39) RPA = 85/24 (44)  PCW = 16 Fick cardiac output/index = 4.3/2.2 PVR = 5.0  Ao sat = 90% PA sat = 51%, 50%  [facility] 04/2022   1. Left ventricular ejection fraction, by estimation, is 20 to 25%. The  left ventricle has severely decreased function. The left ventricle  demonstrates global hypokinesis. The left ventricular internal cavity size  was mildly dilated. Left ventricular  diastolic parameters are consistent with Grade I diastolic dysfunction  (impaired relaxation).   2. Mildly D-shaped interventricular septum suggests RV pressure/volume  overload. Right  ventricular systolic function is severely reduced. The  right ventricular size is moderately enlarged. Tricuspid regurgitation  signal is inadequate for assessing PA  pressure.   3. The mitral valve is normal in structure. No evidence of mitral valve  regurgitation. No evidence of mitral stenosis.   4. The tricuspid valve is abnormal. Tricuspid valve regurgitation is mild  to moderate.   5. The aortic valve is tricuspid. There is mild calcification of the  aortic valve. Aortic valve regurgitation is not visualized. No aortic  stenosis is present.  ROS: All systems negative except as listed in HPI, PMH and Problem List.  SH:  Social History   Socioeconomic History   Marital status: Single    Spouse name: Not on file   Number of children: 2   Years of education: Not on file   Highest education level: 6th grade  Occupational History   Not on file  Tobacco Use   Smoking status: Some Days    Packs/day: 0.50    Years: 52.00    Total pack years: 26.00    Types: Cigarettes   Smokeless tobacco: Never  Vaping Use   Vaping Use: Never used  Substance and Sexual Activity   Alcohol use: No    Alcohol/week: 0.0 standard drinks of alcohol    Comment: Stopped 2009   Drug use: No   Sexual activity: Not Currently  Other Topics Concern   Not on file  Social History  Narrative   ** Merged History Encounter **       Social Determinants of Health   Financial Resource Strain: High Risk (04/12/2022)   Overall Financial Resource Strain (CARDIA)    Difficulty of Paying Living Expenses: Hard  Food Insecurity: No Food Insecurity (04/12/2022)   Hunger Vital Sign    Worried About Running Out of Food in the Last Year: Never true    Ran Out of Food in the Last Year: Never true  Transportation Needs: No Transportation Needs (04/12/2022)   PRAPARE - Hydrologist (Medical): No    Lack of Transportation (Non-Medical): No  Physical Activity: Inactive (02/05/2017)   Exercise  Vital Sign    Days of Exercise per Week: 0 days    Minutes of Exercise per Session: 0 min  Stress: Stress Concern Present (02/05/2017)   [place]    Feeling of Stress : Rather much  Social Connections: Moderately Isolated (02/05/2017)   Social Connection and Isolation Panel [NHANES]    Frequency of Communication with Friends and Family: More than three times a week    Frequency of Social Gatherings with Friends and Family: Once a week    Attends Religious Services: Never    Marine scientist or Organizations: No    Attends Archivist Meetings: Never    Marital Status: Divorced  Human resources officer Violence: Not At Risk (04/14/2022)   Humiliation, Afraid, Rape, and Kick questionnaire    Fear of Current or Ex-Partner: No    Emotionally Abused: No    Physically Abused: No    Sexually Abused: No    FH:  Family History  Problem Relation Age of Onset   Hypertension Mother    Prostate cancer Brother    Mental illness Neg Hx     Past Medical History:  Diagnosis Date   Anginal pain ([place])    Left side if chest ,NTG  relieves chaes apin 12/01/13   Anxiety    Arthritis    Cancer ([place])    colon cancer   CHF (congestive heart failure) ([place])    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   UTI (urinary tract infection)    frequent UTI    Current Outpatient Medications  Medication Sig Dispense Refill   albuterol (PROVENTIL HFA;VENTOLIN HFA) 108  (90 Base) MCG/ACT inhaler Inhale 2 puffs into the lungs every 6 (six) hours as needed for wheezing or shortness of breath. 1 Inhaler 2   amiodarone (PACERONE) 200 MG tablet Take 200 mg by mouth daily.     apixaban (ELIQUIS) 5 MG TABS tablet '10mg'$  BID till 04/20/2022 then '5mg'$  BID from 04/21/2022 onwards 60 tablet 0   budesonide-formoterol (SYMBICORT) 160-4.5 MCG/ACT inhaler Inhale 2 puffs into the lungs 2 (two) times daily. 10.2 g 0   cetirizine (ZYRTEC) 10 MG tablet Take 10 mg by mouth daily.  digoxin (LANOXIN) 0.125 MG tablet Take 0.125 mg by mouth daily.     FARXIGA 10 MG TABS tablet Take 1 tablet (10 mg total) by mouth daily.     fluticasone (FLONASE) 50 MCG/ACT nasal spray Place 1 spray into both nostrils daily.     lidocaine (LIDODERM) 5 % Place 1 patch onto the skin daily. Remove & Discard patch within 12 hours or as directed by MD 30 patch 0   losartan (COZAAR) 25 MG tablet Take 1/2 tablet (12.5 mg total) by mouth daily. 30 tablet 0   Multiple Vitamin (MULTIVITAMIN) tablet Take 1 tablet by mouth daily. For Men     nitroGLYCERIN (NITROSTAT) 0.4 MG SL tablet Place 0.4 mg under the tongue every 5 (five) minutes as needed for chest pain.      nystatin (MYCOSTATIN) 100000 UNIT/ML suspension Take by mouth.     Omega-3 Fatty Acids (FISH OIL PO) Take 1,000 mg by mouth every evening.      omeprazole (PRILOSEC) 40 MG capsule Take 40 mg by mouth daily.     ranolazine (RANEXA) 500 MG 12 hr tablet Take 500 mg by mouth 2 (two) times daily.     rOPINIRole (REQUIP) 1 MG tablet Take 1 mg by mouth at bedtime.     rosuvastatin (CRESTOR) 10 MG tablet Take 10 mg by mouth daily.     sildenafil (REVATIO) 20 MG tablet Take 1 tablet (20 mg total) by mouth 3 (three) times daily. 90 tablet 0   spironolactone (ALDACTONE) 25 MG tablet Take 1 tablet (25 mg total) by mouth daily.     No current facility-administered medications for this encounter.    Vitals:   05/04/22 1038  BP: 120/70  Pulse: 64  SpO2: 100%   Weight: 73.5 kg (162 lb)   Wt Readings from Last 3 Encounters:  05/04/22 73.5 kg (162 lb)  04/18/22 71.2 kg (156 lb 15.5 oz)  10/10/20 81.6 kg (180 lb)    PHYSICAL EXAM: General:  Arrived in a wheel chair. No resp difficulty HEENT: normal Neck: supple. JVP flat. Carotids 2+ bilaterally; no bruits. No lymphadenopathy or thryomegaly appreciated. Cor: PMI normal. Regular rate & rhythm. No rubs, gallops or murmurs. Lungs: clear on 3 liters [place].  Abdomen: soft, nontender, nondistended. No hepatosplenomegaly. No bruits or masses. Good bowel sounds. Extremities: no cyanosis, clubbing, rash, edema Neuro: alert & orientedx3, cranial nerves grossly intact. Moves all 4 extremities w/o difficulty. Affect pleasant.   ECG:SR 69 bpm personally checked.    ASSESSMENT & PLAN: 1. Chronic Submassive PE/ A/c LE DVT  -  04/10/22 S/P Mechanical Thrombectomy R Pulmonary Artery  - RHC w/ moderate to severe PAH with evidence of cor pulmonale  - Continue Eliquis 5 mg twice a day indefinitely.   - Continue sildenafil 20 mg tid  - Will need eventual VQ scan (can do outpatient in several months)   2. Chronic Hypoxic Respiratory Failure - COPD at b/l + submassive PE - Continue oxygen 3 liters [place]> Sats stable.   3. PAF  - EKG today--> SR.  -Continue amio 200 mg daily.   -Continue Eliquis 5 mg twice a day.    4. Chronic HFrEF  - Echo EF down 20-25% with D shaped septum RV Failure, EF previously 40-45%.  - R/LHC c/w severe NICM. Minimal CAD, moderate to severe PAH with evidence of cor pulmonale - NYHA III. Volume status stable.  - Continue digoxin.  - Continue losartan 12.5 mg daily  - Continue spironolactone  25  mg daily.  - Continue  sildenafil 20 mg three times a day.  - Continue  farxiga 10 mg daily - Repeat ECHO in 3 months after HF meds optimized.     5. Lung nodule - Needs follow up CT in 3 months.  - Needs follow up with [facility]    6. Smoker  - Discussed smoking cessation.    7 Social  -Previously homeless. Now in SNF. Plan to discharge tomorrow. Will need HF Paramedicine to start tomorrow.   Check CBC/BMET.  Greater than 50% of the total minutes 30 visit spent in counseling/coordination of care regarding  the above. Discussed with HFSW and setting up HF Paramedicine.   Uriyah Raska NP-C  12:33 PM

## 2022-05-04 ENCOUNTER — Ambulatory Visit (HOSPITAL_COMMUNITY)
Admit: 2022-05-04 | Discharge: 2022-05-04 | Disposition: A | Discharge status: Home / Self Care | Payer: 59 | Attending: Adult Health | Admitting: Adult Health

## 2022-05-04 ENCOUNTER — Ambulatory Visit: Payer: 59 | Admitting: Nurse Practitioner

## 2022-05-04 VITALS — BP 120/70 | HR 64 | Wt 162.0 lb

## 2022-05-04 DIAGNOSIS — J9611 Chronic respiratory failure with hypoxia: Secondary | ICD-10-CM | POA: Insufficient documentation

## 2022-05-04 DIAGNOSIS — I110 Hypertensive heart disease with heart failure: Secondary | ICD-10-CM | POA: Insufficient documentation

## 2022-05-04 DIAGNOSIS — R911 Solitary pulmonary nodule: Secondary | ICD-10-CM | POA: Insufficient documentation

## 2022-05-04 DIAGNOSIS — I82509 Chronic embolism and thrombosis of unspecified deep veins of unspecified lower extremity: Secondary | ICD-10-CM | POA: Insufficient documentation

## 2022-05-04 DIAGNOSIS — C189 Malignant neoplasm of colon, unspecified: Secondary | ICD-10-CM | POA: Diagnosis not present

## 2022-05-04 DIAGNOSIS — I2699 Other pulmonary embolism without acute cor pulmonale: Secondary | ICD-10-CM | POA: Insufficient documentation

## 2022-05-04 DIAGNOSIS — Z8546 Personal history of malignant neoplasm of prostate: Secondary | ICD-10-CM | POA: Diagnosis not present

## 2022-05-04 DIAGNOSIS — M25552 Pain in left hip: Secondary | ICD-10-CM | POA: Diagnosis not present

## 2022-05-04 DIAGNOSIS — Z86711 Personal history of pulmonary embolism: Secondary | ICD-10-CM | POA: Diagnosis not present

## 2022-05-04 DIAGNOSIS — Z7901 Long term (current) use of anticoagulants: Secondary | ICD-10-CM | POA: Insufficient documentation

## 2022-05-04 DIAGNOSIS — I2692 Saddle embolus of pulmonary artery without acute cor pulmonale: Secondary | ICD-10-CM | POA: Diagnosis not present

## 2022-05-04 DIAGNOSIS — I2510 Atherosclerotic heart disease of native coronary artery without angina pectoris: Secondary | ICD-10-CM | POA: Insufficient documentation

## 2022-05-04 DIAGNOSIS — I428 Other cardiomyopathies: Secondary | ICD-10-CM | POA: Diagnosis not present

## 2022-05-04 DIAGNOSIS — E871 Hypo-osmolality and hyponatremia: Secondary | ICD-10-CM | POA: Diagnosis not present

## 2022-05-04 DIAGNOSIS — Z79899 Other long term (current) drug therapy: Secondary | ICD-10-CM | POA: Diagnosis not present

## 2022-05-04 DIAGNOSIS — I480 Paroxysmal atrial fibrillation: Secondary | ICD-10-CM | POA: Diagnosis not present

## 2022-05-04 DIAGNOSIS — I5023 Acute on chronic systolic (congestive) heart failure: Secondary | ICD-10-CM | POA: Diagnosis not present

## 2022-05-04 DIAGNOSIS — I10 Essential (primary) hypertension: Secondary | ICD-10-CM | POA: Diagnosis not present

## 2022-05-04 DIAGNOSIS — J441 Chronic obstructive pulmonary disease with (acute) exacerbation: Secondary | ICD-10-CM | POA: Diagnosis not present

## 2022-05-04 DIAGNOSIS — J449 Chronic obstructive pulmonary disease, unspecified: Secondary | ICD-10-CM | POA: Diagnosis not present

## 2022-05-04 DIAGNOSIS — I2782 Chronic pulmonary embolism: Secondary | ICD-10-CM

## 2022-05-04 DIAGNOSIS — I82403 Acute embolism and thrombosis of unspecified deep veins of lower extremity, bilateral: Secondary | ICD-10-CM | POA: Diagnosis not present

## 2022-05-04 DIAGNOSIS — I5022 Chronic systolic (congestive) heart failure: Secondary | ICD-10-CM | POA: Diagnosis not present

## 2022-05-04 DIAGNOSIS — J9600 Acute respiratory failure, unspecified whether with hypoxia or hypercapnia: Secondary | ICD-10-CM | POA: Diagnosis not present

## 2022-05-04 DIAGNOSIS — M6281 Muscle weakness (generalized): Secondary | ICD-10-CM | POA: Diagnosis not present

## 2022-05-04 LAB — BASIC METABOLIC PANEL
Anion gap: 7 (ref 5–15)
BUN: 13 mg/dL (ref 8–23)
CO2: 29 mmol/L (ref 22–32)
Calcium: 9.6 mg/dL (ref 8.9–10.3)
Chloride: 99 mmol/L (ref 98–111)
Creatinine, Ser: 1.25 mg/dL — ABNORMAL HIGH (ref 0.61–1.24)
GFR, Estimated: >60 mL/min (ref >60)
Glucose, Bld: 108 mg/dL — ABNORMAL HIGH (ref 70–99)
Potassium: 3.8 mmol/L (ref 3.5–5.1)
Sodium: 135 mmol/L (ref 135–145)

## 2022-05-04 LAB — CBC
HCT: 38.6 % — ABNORMAL LOW (ref 39.0–52.0)
Hemoglobin: 13 g/dL (ref 13.0–17.0)
MCH: 31.9 pg (ref 26.0–34.0)
MCHC: 33.7 g/dL (ref 30.0–36.0)
MCV: 94.8 fL (ref 80.0–100.0)
Platelets: 300 10*3/uL (ref 150–400)
RBC: 4.07 MIL/uL — ABNORMAL LOW (ref 4.22–5.81)
RDW: 14.7 % (ref 11.5–15.5)
WBC: 7.9 10*3/uL (ref 4.0–10.5)
nRBC: 0 % (ref 0.0–0.2)

## 2022-05-04 NOTE — Progress Notes (Signed)
H&V Care Navigation CSW Progress Note  Clinical Social Worker met with pt to discuss paramedicine program.  Pt had been referred to paramedicine while in the hospital but went to SNF at [place] so has not been seen at this time.  Pt is agreeable to them coming out- current address is [place], [place], [place] [address]- does not have working phone so says to call his friend, Bruce [name] [phone], who he currently lives with.   SDOH Screenings   Food Insecurity: No Food Insecurity (04/12/2022)  Housing: High Risk (04/12/2022)  Transportation Needs: No Transportation Needs (04/12/2022)  Utilities: Not At Risk (04/14/2022)  Financial Resource Strain: High Risk (04/12/2022)  Physical Activity: Inactive (02/05/2017)  Social Connections: Moderately Isolated (02/05/2017)  Stress: Stress Concern Present (02/05/2017)  Tobacco Use: High Risk (04/14/2022)    Bruce Ny, LCSW Clinical Social Worker [facility] Desk#: [phone] Cell#: [phone]

## 2022-05-04 NOTE — Patient Instructions (Signed)
The instructions below were given to patient via SNF orders  It was great to see you today! No medication changes are needed at this time.   Labs today We will only contact you if something comes back abnormal or we need to make some changes. Otherwise no news is good news!  Your physician recommends that you schedule a follow-up appointment in: 3-4 weeks  in the Advanced Practitioners (PA/NP) Clinic and in 6-8 weeks with Dr Haroldine Laws  Do the following things EVERYDAY: Weigh yourself in the morning before breakfast. Write it down and keep it in a log. Take your medicines as prescribed Eat low salt foods--Limit salt (sodium) to 2000 mg per day.  Stay as active as you can everyday Limit all fluids for the day to less than 2 liters

## 2022-05-05 DIAGNOSIS — I4891 Unspecified atrial fibrillation: Secondary | ICD-10-CM | POA: Diagnosis not present

## 2022-05-05 DIAGNOSIS — J449 Chronic obstructive pulmonary disease, unspecified: Secondary | ICD-10-CM | POA: Diagnosis not present

## 2022-05-05 DIAGNOSIS — I2699 Other pulmonary embolism without acute cor pulmonale: Secondary | ICD-10-CM | POA: Diagnosis not present

## 2022-05-05 DIAGNOSIS — I2510 Atherosclerotic heart disease of native coronary artery without angina pectoris: Secondary | ICD-10-CM | POA: Diagnosis not present

## 2022-05-05 DIAGNOSIS — J9601 Acute respiratory failure with hypoxia: Secondary | ICD-10-CM | POA: Diagnosis not present

## 2022-05-08 ENCOUNTER — Other Ambulatory Visit: Payer: Self-pay | Admitting: *Deleted

## 2022-05-08 ENCOUNTER — Telehealth: Payer: Self-pay | Admitting: *Deleted

## 2022-05-08 ENCOUNTER — Other Ambulatory Visit (HOSPITAL_COMMUNITY): Payer: Self-pay

## 2022-05-08 DIAGNOSIS — I5022 Chronic systolic (congestive) heart failure: Secondary | ICD-10-CM

## 2022-05-08 NOTE — Progress Notes (Signed)
Paramedicine Encounter    Patient ID: Bruce [name], male    DOB: April 02, 1945, 77 y.o.   MRN: [id number]   Complaints- short of breath on exertion, dizzy when standing, right sided pectoral pain on ROM and inhalation.   Assessment- CAOx4, warm and dry seated on couch. Wearing 3LPM oxygen, no shortness of breath while seated.   Compliance with meds- taking medications but not taking them properlt- some medications he was only taking once daily. We were able to rectify same today.   Pill box filled- for one week.   Refills needed- Eliquis, Zyrtec, Symbicort, Lidocaine Patches, Losartan, Sidenafil   Meds changes since last visit- today is initial paramedicine visit     Social changes- living with friend [name]    There were no vitals taken for this visit. Weight yesterday-didn't weigh  Last visit weight-- 162lbs    Arrived for initial home visit for Mr. Bruce [name]. Currently living in a two bedroom single wide mobile home with four steps that are unstable leading into the home. He is staying with his friend [name], her brother, four dogs and a bird. He is currently sleeping on the couch. Mr. Bruce [name] reports to be feeling okay today but expresses that sometimes he gets dizzy when standing and almost always gets short of breath when walking short distances. He says he doesn't trust himself to stand alone without a walker or cane. I advised him to continue using those assistive devices to walk until he feels comfortable- he agreed. Today he is on 3LPM of oxygen using a nasal cannula inside the home. He is seated on the couch alert and oriented. He reports that he has been sleeping on right side with his arm above his head and feels like he pulled a muscle in his chest. He complains of pain on inhalation in the right pectoral area of his chest, he reports it worsens with ROM as well. I suggested him use an ice pack or heating pad as well as taking OTC Tylenol for pain or using his Lidocaine Patches.  He agreed with plan. Lungs clear but slightly diminished on assessment. He says he gets dizzy when standing some times. I obtained vitals as noted. BP dropped slightly when standing 96/58. I encouraged him to be sure to be drinking 68 ounces daily and to be eating heart healthy diet as best he could. He expressed he has been trying since coming home on Friday from SNF. No lower leg edema noted. He is not weighing at home- needs a scale. I will get one for him.  We reviewed medications at length and I set up a weekly pill organizer for him. No meds missing this week but several will need to be called into [facility]. After our visit I contacted them and reviewed and verified his current med list updating them of the ones that were discontinued. I will have refills they need sent in. We reviewed pill organizer and how to take as well as upcoming appointments. I provided him with my contact information and how to reach me incase he needs to follow up aside from our scheduled weekly visits which will be on Mondays for now. He agreed with plan. Home visit complete. I will see [place] in one week.   Refills: Eliquis Zyrtec Symbicort Lidocaine Patches Losartan  [place], [place]  ACTION: Home visit completed    Patient Care Team: [name], NP as PCP - General (Nurse Practitioner) Neoma Laming  A, MD as PCP - Cardiology (Cardiology)  Patient Active Problem List   Diagnosis Date Noted   Chronic systolic heart failure ([place]) 05/04/2022   Pulmonary hypertension, unspecified ([place]) [id number]   Acute systolic HF (heart failure) ([place]) 04/13/2022   Pulmonary embolism ([place]) 04/09/2022   Acute respiratory failure with hypoxia ([place]) 04/09/2022   Lactic acidosis 04/09/2022   Hypokalemia 04/09/2022   COPD with acute exacerbation ([place]) 05/03/2020   PAF (paroxysmal atrial fibrillation) ([place])    Syncope 12/31/2018   Intracranial bleed ([place])  12/31/2018   AKI (acute kidney injury) ([place])    Frontal lobe contusion ([place])    Multiple closed facial bone fractures (HCC)    NSTEMI (non-ST elevated myocardial infarction) ([place]) 10/10/2018   Recurrent syncope 04/28/2018   Malignant neoplasm of prostate ([place]) 07/13/2016   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   Visual hallucination 03/18/2015   Auditory hallucination 03/18/2015   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 01/01/2015   Tibial plateau fracture 12/29/2014   Limb ischemia 11/18/2013   Blunt trauma of lower leg 10/08/2013   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[place]) 10/08/2013   Acute blood loss anemia 10/08/2013   Anxiety disorder 10/08/2013   Dyslipidemia 10/08/2013   GERD (gastroesophageal reflux disease) 10/08/2013   History of MI (myocardial infarction) 10/08/2013   Anticoagulated 10/08/2013   Hypertension    Tibia/fibula fracture 10/06/2013   CAD (coronary artery disease) 08/16/2010   Edema 08/16/2010   COPD (chronic obstructive pulmonary disease) ([place]) 08/16/2010   DVT of leg (deep venous thrombosis) ([place]) 08/16/2010   Leg cramps 08/16/2010    Current Outpatient Medications:    albuterol (PROVENTIL HFA;VENTOLIN HFA) 108 (90 Base) MCG/ACT inhaler, Inhale 2 puffs into the lungs every 6 (six) hours as needed for wheezing or shortness of breath., Disp: 1 Inhaler, Rfl: 2   amiodarone (PACERONE) 200 MG tablet, Take 200 mg by mouth daily., Disp: , Rfl:    apixaban (ELIQUIS) 5 MG TABS tablet, '10mg'$  BID till 04/20/2022 then '5mg'$  BID from 04/21/2022 onwards, Disp: 60 tablet, Rfl: 0   budesonide-formoterol (SYMBICORT) 160-4.5 MCG/ACT inhaler, Inhale 2 puffs into the lungs 2 (two) times daily., Disp: 10.2 g, Rfl: 0   digoxin (LANOXIN) 0.125 MG tablet, Take 0.125 mg by mouth daily., Disp: , Rfl:    FARXIGA 10 MG TABS tablet, Take 1 tablet (10 mg total) by mouth daily., Disp: , Rfl:    fluticasone (FLONASE) 50 MCG/ACT nasal spray, Place 1 spray into both nostrils  daily., Disp: , Rfl:    lidocaine (LIDODERM) 5 %, Place 1 patch onto the skin daily. Remove & Discard patch within 12 hours or as directed by MD, Disp: 30 patch, Rfl: 0   losartan (COZAAR) 25 MG tablet, Take 1/2 tablet (12.5 mg total) by mouth daily., Disp: 30 tablet, Rfl: 0   Multiple Vitamin (MULTIVITAMIN) tablet, Take 1 tablet by mouth daily. For Men, Disp: , Rfl:    nitroGLYCERIN (NITROSTAT) 0.4 MG SL tablet, Place 0.4 mg under the tongue every 5 (five) minutes as needed for chest pain. , Disp: , Rfl:    Omega-3 Fatty Acids (FISH OIL PO), Take 1,000 mg by mouth every evening. , Disp: , Rfl:    omeprazole (PRILOSEC) 40 MG capsule, Take 40 mg by mouth daily., Disp: , Rfl:    ranolazine (RANEXA) 500 MG 12 hr tablet, Take 500 mg by mouth 2 (two) times daily., Disp: , Rfl:    rOPINIRole (REQUIP) 1 MG tablet, Take 1 mg by mouth  at bedtime., Disp: , Rfl:    rosuvastatin (CRESTOR) 10 MG tablet, Take 10 mg by mouth daily., Disp: , Rfl:    sildenafil (REVATIO) 20 MG tablet, Take 1 tablet (20 mg total) by mouth 3 (three) times daily., Disp: 90 tablet, Rfl: 0   spironolactone (ALDACTONE) 25 MG tablet, Take 1 tablet (25 mg total) by mouth daily., Disp: , Rfl:    cetirizine (ZYRTEC) 10 MG tablet, Take 10 mg by mouth daily., Disp: , Rfl:    nystatin (MYCOSTATIN) 100000 UNIT/ML suspension, Take by mouth. (Patient not taking: Reported on 05/08/2022), Disp: , Rfl:  Allergies  Allergen Reactions   Adhesive [Tape] Other (See Comments)    After right leg fracture surgery, pt developed a large blister where tape was applied to his right leg. OK to use paper tape.   Imdur [Isosorbide Dinitrate] Other (See Comments)    hallucinations   Singulair [Montelukast Sodium] Other (See Comments)    Hallucinations      Social History   Socioeconomic History   Marital status: Single    Spouse name: Not on file   Number of children: 2   Years of education: Not on file   Highest education level: 6th grade   Occupational History   Not on file  Tobacco Use   Smoking status: Some Days    Packs/day: 0.50    Years: 52.00    Total pack years: 26.00    Types: Cigarettes   Smokeless tobacco: Never  Vaping Use   Vaping Use: Never used  Substance and Sexual Activity   Alcohol use: No    Alcohol/week: 0.0 standard drinks of alcohol    Comment: Stopped 2009   Drug use: No   Sexual activity: Not Currently  Other Topics Concern   Not on file  Social History Narrative   ** Merged History Encounter **       Social Determinants of Health   Financial Resource Strain: High Risk (04/12/2022)   Overall Financial Resource Strain (CARDIA)    Difficulty of Paying Living Expenses: Hard  Food Insecurity: No Food Insecurity (04/12/2022)   Hunger Vital Sign    Worried About Running Out of Food in the Last Year: Never true    Ran Out of Food in the Last Year: Never true  Transportation Needs: No Transportation Needs (04/12/2022)   PRAPARE - Hydrologist (Medical): No    Lack of Transportation (Non-Medical): No  Physical Activity: Inactive (02/05/2017)   Exercise Vital Sign    Days of Exercise per Week: 0 days    Minutes of Exercise per Session: 0 min  Stress: Stress Concern Present (02/05/2017)   [place]    Feeling of Stress : Rather much  Social Connections: Moderately Isolated (02/05/2017)   Social Connection and Isolation Panel [NHANES]    Frequency of Communication with Friends and Family: More than three times a week    Frequency of Social Gatherings with Friends and Family: Once a week    Attends Religious Services: Never    Marine scientist or Organizations: No    Attends Archivist Meetings: Never    Marital Status: Divorced  Human resources officer Violence: Not At Risk (04/14/2022)   Humiliation, Afraid, Rape, and Kick questionnaire    Fear of Current or Ex-Partner: No    Emotionally  Abused: No    Physically Abused: No    Sexually Abused: No  Physical Exam      Future Appointments  Date Time Provider [place]  05/15/2022 11:15 AM [name], NP AMA-AMA None  05/22/2022 10:00 AM Dannielle Karvonen, RN THN-CCC None  05/25/2022 10:00 AM [facility] PA/NP [facility] None  06/21/2022 11:40 AM Bensimhon, Shaune Pascal, MD [facility] None

## 2022-05-08 NOTE — Progress Notes (Signed)
  Care Coordination   Note   05/08/2022 Name: Bruce [name] MRN: [id number] DOB: 05-07-1945  [name] is a 77 y.o. year old male who sees [name], NP for primary care. I reached out to [name] by phone today to offer care coordination services.  [name] was given information about Care Coordination services today including:   The Care Coordination services include support from the care team which includes your Nurse Coordinator, Clinical Social Worker, or Pharmacist.  The Care Coordination team is here to help remove barriers to the health concerns and goals most important to you. Care Coordination services are voluntary, and the patient may decline or stop services at any time by request to their care team member.   Care Coordination Consent Status: Patient agreed to services and verbal consent obtained.   Follow up plan:  Telephone appointment with care coordination team member scheduled for:  05/22/2022  Encounter Outcome:  Pt. Scheduled from referral   Julian Hy, [place] Direct Dial: [phone]

## 2022-05-08 NOTE — Patient Outreach (Addendum)
[facility]N Post- Acute Care Coordinator follow up. Verified in [facility] Mr. Bonenfant discharged from [facility] on Friday, 05/05/22. Screening for potential [facility] care coordination services as benefit of health plan and PCP.   Secure communication sent to [employer] worker to inquire about home health agency arrangements. [name], Education officer, museum previously indicated Mr. Marks will transition to home with a family friend. Appears PCP follow up is scheduled for 05/15/22.  Mr. Winham has medical history of  CAD, MI, CHF, COPD, HTN, prostate cancer, colon cancer.   Will refer to [facility] care coordination team.   Marthenia Rolling, MSN, RN,BSN [place] Acute Care Coordinator [phone] (Direct dial)

## 2022-05-09 ENCOUNTER — Other Ambulatory Visit (HOSPITAL_COMMUNITY): Payer: Self-pay

## 2022-05-09 DIAGNOSIS — E871 Hypo-osmolality and hyponatremia: Secondary | ICD-10-CM | POA: Diagnosis not present

## 2022-05-09 DIAGNOSIS — I5022 Chronic systolic (congestive) heart failure: Secondary | ICD-10-CM | POA: Diagnosis not present

## 2022-05-09 DIAGNOSIS — Z7901 Long term (current) use of anticoagulants: Secondary | ICD-10-CM | POA: Diagnosis not present

## 2022-05-09 DIAGNOSIS — Z9981 Dependence on supplemental oxygen: Secondary | ICD-10-CM | POA: Diagnosis not present

## 2022-05-09 DIAGNOSIS — J9600 Acute respiratory failure, unspecified whether with hypoxia or hypercapnia: Secondary | ICD-10-CM | POA: Diagnosis not present

## 2022-05-09 DIAGNOSIS — I110 Hypertensive heart disease with heart failure: Secondary | ICD-10-CM | POA: Diagnosis not present

## 2022-05-09 DIAGNOSIS — I480 Paroxysmal atrial fibrillation: Secondary | ICD-10-CM | POA: Diagnosis not present

## 2022-05-09 DIAGNOSIS — I2510 Atherosclerotic heart disease of native coronary artery without angina pectoris: Secondary | ICD-10-CM | POA: Diagnosis not present

## 2022-05-09 DIAGNOSIS — K219 Gastro-esophageal reflux disease without esophagitis: Secondary | ICD-10-CM | POA: Diagnosis not present

## 2022-05-09 DIAGNOSIS — J441 Chronic obstructive pulmonary disease with (acute) exacerbation: Secondary | ICD-10-CM | POA: Diagnosis not present

## 2022-05-09 DIAGNOSIS — Z9181 History of falling: Secondary | ICD-10-CM | POA: Diagnosis not present

## 2022-05-09 DIAGNOSIS — Z7984 Long term (current) use of oral hypoglycemic drugs: Secondary | ICD-10-CM | POA: Diagnosis not present

## 2022-05-09 DIAGNOSIS — I2699 Other pulmonary embolism without acute cor pulmonale: Secondary | ICD-10-CM | POA: Diagnosis not present

## 2022-05-09 DIAGNOSIS — Z7951 Long term (current) use of inhaled steroids: Secondary | ICD-10-CM | POA: Diagnosis not present

## 2022-05-09 DIAGNOSIS — Z48812 Encounter for surgical aftercare following surgery on the circulatory system: Secondary | ICD-10-CM | POA: Diagnosis not present

## 2022-05-09 DIAGNOSIS — M25552 Pain in left hip: Secondary | ICD-10-CM | POA: Diagnosis not present

## 2022-05-09 DIAGNOSIS — R911 Solitary pulmonary nodule: Secondary | ICD-10-CM | POA: Diagnosis not present

## 2022-05-09 DIAGNOSIS — I252 Old myocardial infarction: Secondary | ICD-10-CM | POA: Diagnosis not present

## 2022-05-09 DIAGNOSIS — Z85038 Personal history of other malignant neoplasm of large intestine: Secondary | ICD-10-CM | POA: Diagnosis not present

## 2022-05-09 DIAGNOSIS — H3530 Unspecified macular degeneration: Secondary | ICD-10-CM | POA: Diagnosis not present

## 2022-05-09 MED ORDER — LOSARTAN POTASSIUM 25 MG PO TABS: 12.5000 mg | ORAL_TABLET | Freq: Every day | ORAL | 0 refills | Status: DC

## 2022-05-09 MED ORDER — SILDENAFIL CITRATE 20 MG PO TABS: 20.0000 mg | ORAL_TABLET | Freq: Three times a day (TID) | ORAL | 0 refills | Status: DC

## 2022-05-09 MED ORDER — APIXABAN 5 MG PO TABS: 5.0000 mg | ORAL_TABLET | Freq: Two times a day (BID) | ORAL | 3 refills | Status: DC

## 2022-05-09 MED ORDER — APIXABAN 5 MG PO TABS: ORAL_TABLET | ORAL | 0 refills | Status: DC

## 2022-05-10 ENCOUNTER — Other Ambulatory Visit (HOSPITAL_COMMUNITY): Payer: Self-pay

## 2022-05-12 ENCOUNTER — Other Ambulatory Visit: Payer: Self-pay

## 2022-05-12 DIAGNOSIS — Z9181 History of falling: Secondary | ICD-10-CM | POA: Diagnosis not present

## 2022-05-12 DIAGNOSIS — I252 Old myocardial infarction: Secondary | ICD-10-CM | POA: Diagnosis not present

## 2022-05-12 DIAGNOSIS — J441 Chronic obstructive pulmonary disease with (acute) exacerbation: Secondary | ICD-10-CM | POA: Diagnosis not present

## 2022-05-12 DIAGNOSIS — I5022 Chronic systolic (congestive) heart failure: Secondary | ICD-10-CM | POA: Diagnosis not present

## 2022-05-12 DIAGNOSIS — Z7901 Long term (current) use of anticoagulants: Secondary | ICD-10-CM | POA: Diagnosis not present

## 2022-05-12 DIAGNOSIS — I2699 Other pulmonary embolism without acute cor pulmonale: Secondary | ICD-10-CM | POA: Diagnosis not present

## 2022-05-12 DIAGNOSIS — K219 Gastro-esophageal reflux disease without esophagitis: Secondary | ICD-10-CM | POA: Diagnosis not present

## 2022-05-12 DIAGNOSIS — R911 Solitary pulmonary nodule: Secondary | ICD-10-CM | POA: Diagnosis not present

## 2022-05-12 DIAGNOSIS — Z9981 Dependence on supplemental oxygen: Secondary | ICD-10-CM | POA: Diagnosis not present

## 2022-05-12 DIAGNOSIS — Z85038 Personal history of other malignant neoplasm of large intestine: Secondary | ICD-10-CM | POA: Diagnosis not present

## 2022-05-12 DIAGNOSIS — J9600 Acute respiratory failure, unspecified whether with hypoxia or hypercapnia: Secondary | ICD-10-CM | POA: Diagnosis not present

## 2022-05-12 DIAGNOSIS — Z7984 Long term (current) use of oral hypoglycemic drugs: Secondary | ICD-10-CM | POA: Diagnosis not present

## 2022-05-12 DIAGNOSIS — E871 Hypo-osmolality and hyponatremia: Secondary | ICD-10-CM | POA: Diagnosis not present

## 2022-05-12 DIAGNOSIS — I110 Hypertensive heart disease with heart failure: Secondary | ICD-10-CM | POA: Diagnosis not present

## 2022-05-12 DIAGNOSIS — I480 Paroxysmal atrial fibrillation: Secondary | ICD-10-CM | POA: Diagnosis not present

## 2022-05-12 DIAGNOSIS — I2510 Atherosclerotic heart disease of native coronary artery without angina pectoris: Secondary | ICD-10-CM | POA: Diagnosis not present

## 2022-05-12 DIAGNOSIS — Z48812 Encounter for surgical aftercare following surgery on the circulatory system: Secondary | ICD-10-CM | POA: Diagnosis not present

## 2022-05-12 DIAGNOSIS — Z7951 Long term (current) use of inhaled steroids: Secondary | ICD-10-CM | POA: Diagnosis not present

## 2022-05-12 DIAGNOSIS — H3530 Unspecified macular degeneration: Secondary | ICD-10-CM | POA: Diagnosis not present

## 2022-05-12 DIAGNOSIS — M25552 Pain in left hip: Secondary | ICD-10-CM | POA: Diagnosis not present

## 2022-05-12 MED ORDER — LIDOCAINE 5 % EX PTCH: 1.0000 | MEDICATED_PATCH | CUTANEOUS | 11 refills | Status: DC

## 2022-05-12 MED ORDER — BUDESONIDE-FORMOTEROL FUMARATE 160-4.5 MCG/ACT IN AERO: 2.0000 | INHALATION_SPRAY | Freq: Two times a day (BID) | RESPIRATORY_TRACT | 3 refills | Status: DC

## 2022-05-12 MED ORDER — CETIRIZINE HCL 10 MG PO TABS: 10.0000 mg | ORAL_TABLET | Freq: Every day | ORAL | 11 refills | Status: DC

## 2022-05-15 ENCOUNTER — Ambulatory Visit: Payer: 59 | Admitting: Nurse Practitioner

## 2022-05-15 ENCOUNTER — Other Ambulatory Visit (HOSPITAL_COMMUNITY): Payer: Self-pay

## 2022-05-15 NOTE — Progress Notes (Signed)
Paramedicine Encounter    Patient ID: Bruce [name], male    DOB: [birth year], 77 y.o.   MRN: [id number]   Complaints- none   Assessment- CAOX4, warm and dry on oxygen 3LPM via [place]. No swelling, no major weight gain. Denied dizziness more than normal, chest pain, increased shortness of breath more than normal.   Compliance with meds- missed one evening dose in one week   Pill box filled- for one week missing several meds near end of week   Refills needed- Eliquis Digoxin Farxiga Omperazole Ranexa Requip Crestor    Meds changes since last visit- none     Social changes- none    BP 130/60   Pulse 62   Resp 18   Wt 165 lb (74.8 kg)   SpO2 99% Comment: 3lpm  BMI 21.77 kg/m  Weight yesterday-didn't weigh Last visit weight-162lbs  Arrived for home visit for [facility] who reports to be feeling okay today. He denied any symptoms or new onset of any new symptoms over the last week. He is seated on oxygen and denied any increased shortness of breath more than normal. Lung sounds clear. No lower leg swelling. I provided [name] a scale today weight is 165lbs. I obtained vitals as noted. I reviewed meds and filled pill box for one week- he will need several meds by Friday morning that are missing in refills. I will reach out to [place] to see status of refills. We reviewed appointments- he missed todays PCP visit. I called and left a VM for scheduler to reach out to me to assist. I also will attempt to obtain an appointment for [facility]. Education provided. I will see [name] later this week for a med rec. Home visit complete.    [name], [place]  ACTION: Home visit completed    Patient Care Team: [name], NP as PCP - General (Nurse Practitioner) [name], MD as PCP - Cardiology (Cardiology)  Patient Active Problem List   Diagnosis Date Noted   Chronic systolic heart failure ([place]) 05/04/2022   Pulmonary  hypertension, unspecified ([place]) [id number]   Acute systolic HF (heart failure) ([place]) 04/13/2022   Pulmonary embolism ([place]) 04/09/2022   Acute respiratory failure with hypoxia ([place]) 04/09/2022   Lactic acidosis 04/09/2022   Hypokalemia 04/09/2022   COPD with acute exacerbation ([place]) 05/03/2020   PAF (paroxysmal atrial fibrillation) ([place])    Syncope 12/31/2018   Intracranial bleed ([place]) 12/31/2018   AKI (acute kidney injury) ([place])    Frontal lobe contusion ([name])    Multiple closed facial bone fractures (HCC)    NSTEMI (non-ST elevated myocardial infarction) ([place]) 10/10/2018   Recurrent syncope 04/28/2018   Malignant neoplasm of prostate ([place]) 07/13/2016   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   Visual hallucination 03/18/2015   Auditory hallucination 03/18/2015   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 01/01/2015   Tibial plateau fracture 12/29/2014   Limb ischemia 11/18/2013   Blunt trauma of lower leg 10/08/2013   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[place]) 10/08/2013   Acute blood loss anemia 10/08/2013   Anxiety disorder 10/08/2013   Dyslipidemia 10/08/2013   GERD (gastroesophageal reflux disease) 10/08/2013   History of MI (myocardial infarction) 10/08/2013   Anticoagulated 10/08/2013   Hypertension    Tibia/fibula fracture 10/06/2013   CAD (coronary artery disease) 08/16/2010   Edema 08/16/2010   COPD (chronic obstructive pulmonary disease) ([place]) 08/16/2010   DVT of leg (deep venous thrombosis) ([place]) 08/16/2010   Leg cramps  08/16/2010    Current Outpatient Medications:    albuterol (PROVENTIL HFA;VENTOLIN HFA) 108 (90 Base) MCG/ACT inhaler, Inhale 2 puffs into the lungs every 6 (six) hours as needed for wheezing or shortness of breath., Disp: 1 Inhaler, Rfl: 2   amiodarone (PACERONE) 200 MG tablet, Take 200 mg by mouth daily., Disp: , Rfl:    apixaban (ELIQUIS) 5 MG TABS tablet, Take 1 tablet (5 mg total) by mouth 2 (two) times daily., Disp: 60 tablet,  Rfl: 3   budesonide-formoterol (SYMBICORT) 160-4.5 MCG/ACT inhaler, Inhale 2 puffs into the lungs 2 (two) times daily., Disp: 10.2 g, Rfl: 3   cetirizine (ZYRTEC) 10 MG tablet, Take 1 tablet (10 mg total) by mouth daily., Disp: 30 tablet, Rfl: 11   digoxin (LANOXIN) 0.125 MG tablet, Take 0.125 mg by mouth daily., Disp: , Rfl:    FARXIGA 10 MG TABS tablet, Take 1 tablet (10 mg total) by mouth daily., Disp: , Rfl:    fluticasone (FLONASE) 50 MCG/ACT nasal spray, Place 1 spray into both nostrils daily., Disp: , Rfl:    lidocaine (LIDODERM) 5 %, Place 1 patch onto the skin daily. Remove & Discard patch within 12 hours or as directed by MD, Disp: 30 patch, Rfl: 11   losartan (COZAAR) 25 MG tablet, Take 1/2 tablet (12.5 mg total) by mouth daily., Disp: 30 tablet, Rfl: 0   Multiple Vitamin (MULTIVITAMIN) tablet, Take 1 tablet by mouth daily. For Men, Disp: , Rfl:    nitroGLYCERIN (NITROSTAT) 0.4 MG SL tablet, Place 0.4 mg under the tongue every 5 (five) minutes as needed for chest pain. , Disp: , Rfl:    omeprazole (PRILOSEC) 40 MG capsule, Take 40 mg by mouth daily., Disp: , Rfl:    ranolazine (RANEXA) 500 MG 12 hr tablet, Take 500 mg by mouth 2 (two) times daily., Disp: , Rfl:    rOPINIRole (REQUIP) 1 MG tablet, Take 1 mg by mouth at bedtime., Disp: , Rfl:    rosuvastatin (CRESTOR) 10 MG tablet, Take 10 mg by mouth daily., Disp: , Rfl:    sildenafil (REVATIO) 20 MG tablet, Take 1 tablet (20 mg total) by mouth 3 (three) times daily., Disp: 90 tablet, Rfl: 0   spironolactone (ALDACTONE) 25 MG tablet, Take 1 tablet (25 mg total) by mouth daily., Disp: , Rfl:    nystatin (MYCOSTATIN) 100000 UNIT/ML suspension, Take by mouth. (Patient not taking: Reported on 05/08/2022), Disp: , Rfl:    Omega-3 Fatty Acids (FISH OIL PO), Take 1,000 mg by mouth every evening.  (Patient not taking: Reported on 05/15/2022), Disp: , Rfl:  Allergies  Allergen Reactions   Adhesive [Tape] Other (See Comments)    After right leg  fracture surgery, pt developed a large blister where tape was applied to his right leg. OK to use paper tape.   Imdur [Isosorbide Dinitrate] Other (See Comments)    hallucinations   Singulair [Montelukast Sodium] Other (See Comments)    Hallucinations      Social History   Socioeconomic History   Marital status: Single    Spouse name: Not on file   Number of children: 2   Years of education: Not on file   Highest education level: 6th grade  Occupational History   Not on file  Tobacco Use   Smoking status: Some Days    Packs/day: 0.50    Years: 52.00    Total pack years: 26.00    Types: Cigarettes   Smokeless tobacco: Never  Vaping Use  Vaping Use: Never used  Substance and Sexual Activity   Alcohol use: No    Alcohol/week: 0.0 standard drinks of alcohol    Comment: Stopped 2009   Drug use: No   Sexual activity: Not Currently  Other Topics Concern   Not on file  Social History Narrative   ** Merged History Encounter **       Social Determinants of Health   Financial Resource Strain: High Risk (04/12/2022)   Overall Financial Resource Strain (CARDIA)    Difficulty of Paying Living Expenses: Hard  Food Insecurity: No Food Insecurity (04/12/2022)   Hunger Vital Sign    Worried About Running Out of Food in the Last Year: Never true    Ran Out of Food in the Last Year: Never true  Transportation Needs: No Transportation Needs (04/12/2022)   PRAPARE - Hydrologist (Medical): No    Lack of Transportation (Non-Medical): No  Physical Activity: Inactive (02/05/2017)   Exercise Vital Sign    Days of Exercise per Week: 0 days    Minutes of Exercise per Session: 0 min  Stress: Stress Concern Present (02/05/2017)   [place]    Feeling of Stress : Rather much  Social Connections: Moderately Isolated (02/05/2017)   Social Connection and Isolation Panel [NHANES]    Frequency of  Communication with Friends and Family: More than three times a week    Frequency of Social Gatherings with Friends and Family: Once a week    Attends Religious Services: Never    Marine scientist or Organizations: No    Attends Archivist Meetings: Never    Marital Status: Divorced  Human resources officer Violence: Not At Risk (04/14/2022)   Humiliation, Afraid, Rape, and Kick questionnaire    Fear of Current or Ex-Partner: No    Emotionally Abused: No    Physically Abused: No    Sexually Abused: No    Physical Exam      Future Appointments  Date Time Provider [place]  05/22/2022 10:00 AM Dannielle Karvonen, RN [facility] None  05/25/2022 10:00 AM [facility] PA/NP [facility] None  06/21/2022 11:40 AM Bensimhon, Shaune Pascal, MD [facility] None

## 2022-05-16 ENCOUNTER — Other Ambulatory Visit: Payer: Self-pay | Admitting: Nurse Practitioner

## 2022-05-16 ENCOUNTER — Other Ambulatory Visit (HOSPITAL_COMMUNITY): Payer: Self-pay

## 2022-05-16 ENCOUNTER — Telehealth (HOSPITAL_COMMUNITY): Payer: Self-pay

## 2022-05-16 ENCOUNTER — Other Ambulatory Visit: Payer: Self-pay | Admitting: Cardiovascular Disease

## 2022-05-16 ENCOUNTER — Telehealth: Payer: Self-pay

## 2022-05-16 DIAGNOSIS — I252 Old myocardial infarction: Secondary | ICD-10-CM | POA: Diagnosis not present

## 2022-05-16 DIAGNOSIS — I2699 Other pulmonary embolism without acute cor pulmonale: Secondary | ICD-10-CM | POA: Diagnosis not present

## 2022-05-16 DIAGNOSIS — Z48812 Encounter for surgical aftercare following surgery on the circulatory system: Secondary | ICD-10-CM | POA: Diagnosis not present

## 2022-05-16 DIAGNOSIS — K219 Gastro-esophageal reflux disease without esophagitis: Secondary | ICD-10-CM | POA: Diagnosis not present

## 2022-05-16 DIAGNOSIS — M25552 Pain in left hip: Secondary | ICD-10-CM | POA: Diagnosis not present

## 2022-05-16 DIAGNOSIS — Z7901 Long term (current) use of anticoagulants: Secondary | ICD-10-CM | POA: Diagnosis not present

## 2022-05-16 DIAGNOSIS — Z9981 Dependence on supplemental oxygen: Secondary | ICD-10-CM | POA: Diagnosis not present

## 2022-05-16 DIAGNOSIS — I480 Paroxysmal atrial fibrillation: Secondary | ICD-10-CM | POA: Diagnosis not present

## 2022-05-16 DIAGNOSIS — I110 Hypertensive heart disease with heart failure: Secondary | ICD-10-CM | POA: Diagnosis not present

## 2022-05-16 DIAGNOSIS — J9600 Acute respiratory failure, unspecified whether with hypoxia or hypercapnia: Secondary | ICD-10-CM | POA: Diagnosis not present

## 2022-05-16 DIAGNOSIS — H3530 Unspecified macular degeneration: Secondary | ICD-10-CM | POA: Diagnosis not present

## 2022-05-16 DIAGNOSIS — Z7951 Long term (current) use of inhaled steroids: Secondary | ICD-10-CM | POA: Diagnosis not present

## 2022-05-16 DIAGNOSIS — Z9181 History of falling: Secondary | ICD-10-CM | POA: Diagnosis not present

## 2022-05-16 DIAGNOSIS — R911 Solitary pulmonary nodule: Secondary | ICD-10-CM | POA: Diagnosis not present

## 2022-05-16 DIAGNOSIS — Z85038 Personal history of other malignant neoplasm of large intestine: Secondary | ICD-10-CM | POA: Diagnosis not present

## 2022-05-16 DIAGNOSIS — I5022 Chronic systolic (congestive) heart failure: Secondary | ICD-10-CM | POA: Diagnosis not present

## 2022-05-16 DIAGNOSIS — Z7984 Long term (current) use of oral hypoglycemic drugs: Secondary | ICD-10-CM | POA: Diagnosis not present

## 2022-05-16 DIAGNOSIS — J441 Chronic obstructive pulmonary disease with (acute) exacerbation: Secondary | ICD-10-CM | POA: Diagnosis not present

## 2022-05-16 DIAGNOSIS — I2510 Atherosclerotic heart disease of native coronary artery without angina pectoris: Secondary | ICD-10-CM | POA: Diagnosis not present

## 2022-05-16 DIAGNOSIS — E871 Hypo-osmolality and hyponatremia: Secondary | ICD-10-CM | POA: Diagnosis not present

## 2022-05-16 MED ORDER — RANOLAZINE ER 500 MG PO TB12: 500.0000 mg | ORAL_TABLET | Freq: Two times a day (BID) | ORAL | 6 refills | Status: DC

## 2022-05-16 MED ORDER — FARXIGA 10 MG PO TABS: 10.0000 mg | ORAL_TABLET | Freq: Every day | ORAL | Status: DC

## 2022-05-16 MED ORDER — ROSUVASTATIN CALCIUM 10 MG PO TABS: 10.0000 mg | ORAL_TABLET | Freq: Every day | ORAL | 6 refills | Status: DC

## 2022-05-16 MED ORDER — DIGOXIN 125 MCG PO TABS: 0.1250 mg | ORAL_TABLET | Freq: Every day | ORAL | 6 refills | Status: DC

## 2022-05-16 NOTE — Telephone Encounter (Signed)
Shireen OT with [facility] called and left vm requesting a call back regarding verbals for occupational therapy 1w7, please advise

## 2022-05-16 NOTE — Telephone Encounter (Signed)
Spoke to [employer] who reports several of Bruce [name] medications are needing refills sent to their pharmacy- I will forward to HF triage for same.   -Digoxin -[place], [place] 05/16/2022

## 2022-05-17 ENCOUNTER — Telehealth (HOSPITAL_COMMUNITY): Payer: Self-pay | Admitting: Licensed Clinical Social Worker

## 2022-05-17 DIAGNOSIS — I5022 Chronic systolic (congestive) heart failure: Secondary | ICD-10-CM

## 2022-05-17 NOTE — Telephone Encounter (Signed)
HF Paramedicine Team Based Care Meeting  One Month Post Enrollment Assessment  HF MD- NA  HF NP - Amy Clegg NP-C   [facility] admit within the last 30 days for heart failure? No but had hospital stay in Feb  Medications concerns? Still working on getting medications straight after SNF DC- missing several medications at this time. Patient doesn't have a great grasp on what his medications are for and why he takes him but seems as if his roommate has a decent understanding.  Transportation issues ? He has a car but is getting dizzy- has appt next week and friend will drive him here.   Education needs? Concerned about if he is eating heart healthy- so will need to work on education.  SDOH concerns? Not ideal living situation- sleeping on the couch and trailer isn't in the best shape.  [facility]N Referrals Placed: housing assistance to find his own place  Eligible for discharge? Not at this time.  Newer referral and has a lot of struggles with his meds and education at this time to avoid readmission.     [name], LCSW Clinical Social Worker [facility] Desk#: [phone] Cell#: [phone]

## 2022-05-18 ENCOUNTER — Telehealth (HOSPITAL_COMMUNITY): Payer: Self-pay

## 2022-05-18 ENCOUNTER — Other Ambulatory Visit (HOSPITAL_COMMUNITY): Payer: Self-pay

## 2022-05-18 ENCOUNTER — Telehealth: Payer: Self-pay | Admitting: *Deleted

## 2022-05-18 DIAGNOSIS — I252 Old myocardial infarction: Secondary | ICD-10-CM | POA: Diagnosis not present

## 2022-05-18 DIAGNOSIS — I110 Hypertensive heart disease with heart failure: Secondary | ICD-10-CM | POA: Diagnosis not present

## 2022-05-18 DIAGNOSIS — J441 Chronic obstructive pulmonary disease with (acute) exacerbation: Secondary | ICD-10-CM | POA: Diagnosis not present

## 2022-05-18 DIAGNOSIS — I2699 Other pulmonary embolism without acute cor pulmonale: Secondary | ICD-10-CM | POA: Diagnosis not present

## 2022-05-18 DIAGNOSIS — Z7984 Long term (current) use of oral hypoglycemic drugs: Secondary | ICD-10-CM | POA: Diagnosis not present

## 2022-05-18 DIAGNOSIS — I2510 Atherosclerotic heart disease of native coronary artery without angina pectoris: Secondary | ICD-10-CM | POA: Diagnosis not present

## 2022-05-18 DIAGNOSIS — Z7951 Long term (current) use of inhaled steroids: Secondary | ICD-10-CM | POA: Diagnosis not present

## 2022-05-18 DIAGNOSIS — J9600 Acute respiratory failure, unspecified whether with hypoxia or hypercapnia: Secondary | ICD-10-CM | POA: Diagnosis not present

## 2022-05-18 DIAGNOSIS — I5022 Chronic systolic (congestive) heart failure: Secondary | ICD-10-CM | POA: Diagnosis not present

## 2022-05-18 DIAGNOSIS — Z7901 Long term (current) use of anticoagulants: Secondary | ICD-10-CM | POA: Diagnosis not present

## 2022-05-18 DIAGNOSIS — Z85038 Personal history of other malignant neoplasm of large intestine: Secondary | ICD-10-CM | POA: Diagnosis not present

## 2022-05-18 DIAGNOSIS — Z9981 Dependence on supplemental oxygen: Secondary | ICD-10-CM | POA: Diagnosis not present

## 2022-05-18 DIAGNOSIS — H3530 Unspecified macular degeneration: Secondary | ICD-10-CM | POA: Diagnosis not present

## 2022-05-18 DIAGNOSIS — I480 Paroxysmal atrial fibrillation: Secondary | ICD-10-CM | POA: Diagnosis not present

## 2022-05-18 DIAGNOSIS — Z48812 Encounter for surgical aftercare following surgery on the circulatory system: Secondary | ICD-10-CM | POA: Diagnosis not present

## 2022-05-18 DIAGNOSIS — M25552 Pain in left hip: Secondary | ICD-10-CM | POA: Diagnosis not present

## 2022-05-18 DIAGNOSIS — K219 Gastro-esophageal reflux disease without esophagitis: Secondary | ICD-10-CM | POA: Diagnosis not present

## 2022-05-18 DIAGNOSIS — Z9181 History of falling: Secondary | ICD-10-CM | POA: Diagnosis not present

## 2022-05-18 DIAGNOSIS — R911 Solitary pulmonary nodule: Secondary | ICD-10-CM | POA: Diagnosis not present

## 2022-05-18 DIAGNOSIS — E871 Hypo-osmolality and hyponatremia: Secondary | ICD-10-CM | POA: Diagnosis not present

## 2022-05-18 NOTE — Telephone Encounter (Signed)
Patient Advocate Encounter  Received notice from [place] [facility] that prior authorization is needed for this medication. Review of the chart shows that this patient is currently covered by Medicare, and test claim shows $0 copay for this medication.  Prior auth not needed at this time.  Clista Bernhardt, CPhT Rx Patient Advocate Phone: [phone]

## 2022-05-18 NOTE — Progress Notes (Signed)
  Care Coordination   Note   05/18/2022 Name: Bruce [name] MRN: [id number] DOB: May 12, 1945  [name] is a 77 y.o. year old male who sees [name], NP for primary care. I reached out to [name] by phone today to offer care coordination services.  [name] was given information about Care Coordination services today including:   The Care Coordination services include support from the care team which includes your Nurse Coordinator, Clinical Social Worker, or Pharmacist.  The Care Coordination team is here to help remove barriers to the health concerns and goals most important to you. Care Coordination services are voluntary, and the patient may decline or stop services at any time by request to their care team member.   Care Coordination Consent Status: Patient agreed to services and verbal consent obtained.   Follow up plan:  Telephone appointment with care coordination team member scheduled for:  05/23/22  Encounter Outcome:  Pt. Scheduled  [place]  Direct Dial: [phone]

## 2022-05-19 NOTE — Telephone Encounter (Signed)
Shireen informed

## 2022-05-23 ENCOUNTER — Ambulatory Visit: Payer: Self-pay

## 2022-05-23 DIAGNOSIS — I252 Old myocardial infarction: Secondary | ICD-10-CM | POA: Diagnosis not present

## 2022-05-23 DIAGNOSIS — I480 Paroxysmal atrial fibrillation: Secondary | ICD-10-CM | POA: Diagnosis not present

## 2022-05-23 DIAGNOSIS — J441 Chronic obstructive pulmonary disease with (acute) exacerbation: Secondary | ICD-10-CM | POA: Diagnosis not present

## 2022-05-23 DIAGNOSIS — Z9181 History of falling: Secondary | ICD-10-CM | POA: Diagnosis not present

## 2022-05-23 DIAGNOSIS — J9600 Acute respiratory failure, unspecified whether with hypoxia or hypercapnia: Secondary | ICD-10-CM | POA: Diagnosis not present

## 2022-05-23 DIAGNOSIS — Z9981 Dependence on supplemental oxygen: Secondary | ICD-10-CM | POA: Diagnosis not present

## 2022-05-23 DIAGNOSIS — I2510 Atherosclerotic heart disease of native coronary artery without angina pectoris: Secondary | ICD-10-CM | POA: Diagnosis not present

## 2022-05-23 DIAGNOSIS — I110 Hypertensive heart disease with heart failure: Secondary | ICD-10-CM | POA: Diagnosis not present

## 2022-05-23 DIAGNOSIS — Z48812 Encounter for surgical aftercare following surgery on the circulatory system: Secondary | ICD-10-CM | POA: Diagnosis not present

## 2022-05-23 DIAGNOSIS — E871 Hypo-osmolality and hyponatremia: Secondary | ICD-10-CM | POA: Diagnosis not present

## 2022-05-23 DIAGNOSIS — K219 Gastro-esophageal reflux disease without esophagitis: Secondary | ICD-10-CM | POA: Diagnosis not present

## 2022-05-23 DIAGNOSIS — H3530 Unspecified macular degeneration: Secondary | ICD-10-CM | POA: Diagnosis not present

## 2022-05-23 DIAGNOSIS — M25552 Pain in left hip: Secondary | ICD-10-CM | POA: Diagnosis not present

## 2022-05-23 DIAGNOSIS — R911 Solitary pulmonary nodule: Secondary | ICD-10-CM | POA: Diagnosis not present

## 2022-05-23 DIAGNOSIS — I5022 Chronic systolic (congestive) heart failure: Secondary | ICD-10-CM | POA: Diagnosis not present

## 2022-05-23 DIAGNOSIS — Z7951 Long term (current) use of inhaled steroids: Secondary | ICD-10-CM | POA: Diagnosis not present

## 2022-05-23 DIAGNOSIS — I2699 Other pulmonary embolism without acute cor pulmonale: Secondary | ICD-10-CM | POA: Diagnosis not present

## 2022-05-23 DIAGNOSIS — Z85038 Personal history of other malignant neoplasm of large intestine: Secondary | ICD-10-CM | POA: Diagnosis not present

## 2022-05-23 DIAGNOSIS — Z7984 Long term (current) use of oral hypoglycemic drugs: Secondary | ICD-10-CM | POA: Diagnosis not present

## 2022-05-23 DIAGNOSIS — Z7901 Long term (current) use of anticoagulants: Secondary | ICD-10-CM | POA: Diagnosis not present

## 2022-05-24 ENCOUNTER — Other Ambulatory Visit: Payer: Self-pay | Admitting: Cardiovascular Disease

## 2022-05-24 ENCOUNTER — Telehealth (HOSPITAL_COMMUNITY): Payer: Self-pay

## 2022-05-24 ENCOUNTER — Ambulatory Visit: Payer: Self-pay

## 2022-05-24 DIAGNOSIS — I2510 Atherosclerotic heart disease of native coronary artery without angina pectoris: Secondary | ICD-10-CM | POA: Diagnosis not present

## 2022-05-24 DIAGNOSIS — Z7951 Long term (current) use of inhaled steroids: Secondary | ICD-10-CM | POA: Diagnosis not present

## 2022-05-24 DIAGNOSIS — Z85038 Personal history of other malignant neoplasm of large intestine: Secondary | ICD-10-CM | POA: Diagnosis not present

## 2022-05-24 DIAGNOSIS — J9600 Acute respiratory failure, unspecified whether with hypoxia or hypercapnia: Secondary | ICD-10-CM | POA: Diagnosis not present

## 2022-05-24 DIAGNOSIS — E871 Hypo-osmolality and hyponatremia: Secondary | ICD-10-CM | POA: Diagnosis not present

## 2022-05-24 DIAGNOSIS — Z7901 Long term (current) use of anticoagulants: Secondary | ICD-10-CM | POA: Diagnosis not present

## 2022-05-24 DIAGNOSIS — K219 Gastro-esophageal reflux disease without esophagitis: Secondary | ICD-10-CM | POA: Diagnosis not present

## 2022-05-24 DIAGNOSIS — Z7984 Long term (current) use of oral hypoglycemic drugs: Secondary | ICD-10-CM | POA: Diagnosis not present

## 2022-05-24 DIAGNOSIS — Z9981 Dependence on supplemental oxygen: Secondary | ICD-10-CM | POA: Diagnosis not present

## 2022-05-24 DIAGNOSIS — H3530 Unspecified macular degeneration: Secondary | ICD-10-CM | POA: Diagnosis not present

## 2022-05-24 DIAGNOSIS — I110 Hypertensive heart disease with heart failure: Secondary | ICD-10-CM | POA: Diagnosis not present

## 2022-05-24 DIAGNOSIS — I252 Old myocardial infarction: Secondary | ICD-10-CM | POA: Diagnosis not present

## 2022-05-24 DIAGNOSIS — Z48812 Encounter for surgical aftercare following surgery on the circulatory system: Secondary | ICD-10-CM | POA: Diagnosis not present

## 2022-05-24 DIAGNOSIS — Z9181 History of falling: Secondary | ICD-10-CM | POA: Diagnosis not present

## 2022-05-24 DIAGNOSIS — I2699 Other pulmonary embolism without acute cor pulmonale: Secondary | ICD-10-CM | POA: Diagnosis not present

## 2022-05-24 DIAGNOSIS — I480 Paroxysmal atrial fibrillation: Secondary | ICD-10-CM | POA: Diagnosis not present

## 2022-05-24 DIAGNOSIS — R911 Solitary pulmonary nodule: Secondary | ICD-10-CM | POA: Diagnosis not present

## 2022-05-24 DIAGNOSIS — J441 Chronic obstructive pulmonary disease with (acute) exacerbation: Secondary | ICD-10-CM | POA: Diagnosis not present

## 2022-05-24 DIAGNOSIS — M25552 Pain in left hip: Secondary | ICD-10-CM | POA: Diagnosis not present

## 2022-05-24 DIAGNOSIS — I5022 Chronic systolic (congestive) heart failure: Secondary | ICD-10-CM | POA: Diagnosis not present

## 2022-05-24 NOTE — Patient Instructions (Signed)
Visit Information  Thank you for taking time to visit with me today. Please don't hesitate to contact me if I can be of assistance to you.   Following are the goals we discussed today:  Weigh daily and record weights Monitor yourself for signs/ symptoms of heart failure and call provider for symptoms( shortness of breath, swelling in feet, ankles, legs, increase in weight : 3 lbs overnight or 5 lbs in a week.).  Call 911 for severe symptoms.  Remember post hospital follow up appointment with primary care provider for 06/01/22 at 9:30 am Follow a low salt, heart healthy diet   Our next appointment is by telephone on 06/07/22 at 10 am  Please call the care guide team at [phone] if you need to cancel or reschedule your appointment.   If you are experiencing a Mental Health or [place] or need someone to talk to, please call the Suicide and Crisis Lifeline: 988 call 1-800-273-TALK (toll free, 24 hour hotline)  The patient verbalized understanding of instructions, educational materials, and care plan provided today and agreed to receive a mailed copy of patient instructions, educational materials, and care plan.   Quinn Plowman RN,BSN,CCM [place] [phone] direct line  Heart Failure Action Plan A heart failure action plan helps you understand what to do when you have symptoms of heart failure. Your action plan is a color-coded plan that lists the symptoms to watch for and indicates what actions to take. If you have symptoms in the red zone, you need medical care right away. If you have symptoms in the yellow zone, you are having problems. If you have symptoms in the Khiley Lieser zone, you are doing well. Follow the plan that was created by you and your health care provider. Review your plan each time you visit your health care provider. Red zone These signs and symptoms mean you should get medical help right away: You have trouble breathing when resting. You  have a dry cough that is getting worse. You have swelling or pain in your legs or abdomen that is getting worse. You suddenly gain more than 2-3 lb (0.9-1.4 kg) in 24 hours, or more than 5 lb (2.3 kg) in a week. This amount may be more or less depending on your condition. You have trouble staying awake or you feel confused. You have chest pain. You do not have an appetite. You pass out. You have worsening sadness or depression. If you have any of these symptoms, call your local emergency services (911 in the [place].S[place] right away. Do not drive yourself to the hospital. Yellow zone These signs and symptoms mean your condition may be getting worse and you should make some changes: You have trouble breathing when you are active, or you need to sleep with your head raised on extra pillows to help you breathe. You have swelling in your legs or abdomen. You gain 2-3 lb (0.9-1.4 kg) in 24 hours, or 5 lb (2.3 kg) in a week. This amount may be more or less depending on your condition. You get tired easily. You have trouble sleeping. You have a dry cough. If you have any of these symptoms: Contact your health care provider within the next day. Your health care provider may adjust your medicines. Maily Debarge zone These signs mean you are doing well and can continue what you are doing: You do not have shortness of breath. You have very little swelling or no new swelling. Your weight is stable (no gain or  loss). You have a normal activity level. You do not have chest pain or any other new symptoms. Follow these instructions at home: Take over-the-counter and prescription medicines only as told by your health care provider. Weigh yourself daily. Your target weight is __________ lb (__________ kg). Call your health care provider if you gain more than __________ lb (__________ kg) in 24 hours, or more than __________ lb (__________ kg) in a week. Health care provider name:  _____________________________________________________ Health care provider phone number: _____________________________________________________ Eat a heart-healthy diet. Work with a diet and nutrition specialist (dietitian) to create an eating plan that is best for you. Keep all follow-up visits. This is important. Where to find more information American Heart Association: Summary A heart failure action plan helps you understand what to do when you have symptoms of heart failure. Follow the action plan that was created by you and your health care provider. Get help right away if you have any symptoms in the red zone. This information is not intended to replace advice given to you by your health care provider. Make sure you discuss any questions you have with your health care provider. Document Revised: 05/31/2021 Document Reviewed: 10/06/2019 Elsevier Patient Education  [place] With Less [employer] with less salt is one way to reduce the amount of sodium you get from food. Sodium is one of the elements that make up salt. It is found naturally in foods and is also added to certain foods. Depending on your condition and overall health, your health care provider or dietitian may recommend that you reduce your sodium intake. Most people should have less than 2,300 milligrams (mg) of sodium each day. If you have high blood pressure (hypertension), you may need to limit your sodium to 1,500 mg each day. Follow the tips below to help reduce your sodium intake. What are tips for eating less sodium? Reading food labels  Check the food label before buying or using packaged ingredients. Always check the label for the serving size and sodium content. Look for products with no more than 140 mg of sodium in one serving. Check the % Daily Value column to see what percent of the daily recommended amount of sodium is provided in one serving of the product. Foods with 5% or less in this  column are considered low in sodium. Foods with 20% or higher are considered high in sodium. Do not choose foods with salt as one of the first three ingredients on the ingredients list. If salt is one of the first three ingredients, it usually means the item is high in sodium. Shopping Buy sodium-free or low-sodium products. Look for the following words on food labels: Low-sodium. Sodium-free. Reduced-sodium. No salt added. Unsalted. Always check the sodium content even if foods are labeled as low-sodium or no salt added. Buy fresh foods. Cooking Use herbs, seasonings without salt, and spices as substitutes for salt. Use sodium-free baking soda when baking. Grill, braise, or roast foods to add flavor with less salt. Avoid adding salt to pasta, rice, or hot cereals. Drain and rinse canned vegetables, beans, and meat before use. Avoid adding salt when cooking sweets and desserts. Cook with low-sodium ingredients. What foods are high in sodium? Vegetables Regular canned vegetables (not low-sodium or reduced-sodium). Sauerkraut, pickled vegetables, and relishes. Olives. [place] fries. Onion rings. Regular canned tomato sauce and paste. Regular tomato and vegetable juice. Frozen vegetables in sauces. Grains Instant hot cereals. Bread stuffing, pancake, and biscuit mixes. Croutons.  Seasoned rice or pasta mixes. Noodle soup cups. Boxed or frozen macaroni and cheese. Regular salted crackers. Self-rising flour. Rolls. Bagels. Flour tortillas and wraps. Meats and other proteins Meat or fish that is salted, canned, smoked, cured, spiced, or pickled. This includes bacon, ham, sausages, hot dogs, corned beef, chipped beef, meat loaves, salt pork, jerky, pickled herring, anchovies, regular canned tuna, and sardines. Salted nuts. Dairy Processed cheese and cheese spreads. Cheese curds. Blue cheese. Feta cheese. String cheese. Regular cottage cheese. Buttermilk. Canned milk. The items listed above may not  be a complete list of foods high in sodium. Actual amounts of sodium may be different depending on processing. Contact a dietitian for more information. What foods are low in sodium? Fruits Fresh, frozen, or canned fruit with no sauce added. Fruit juice. Vegetables Fresh or frozen vegetables with no sauce added. "No salt added" canned vegetables. "No salt added" tomato sauce and paste. Low-sodium or reduced-sodium tomato and vegetable juice. Grains Noodles, pasta, quinoa, rice. Shredded or puffed wheat or puffed rice. Regular or quick oats (not instant). Low-sodium crackers. Low-sodium bread. Whole-grain bread and whole-grain pasta. Unsalted popcorn. Meats and other proteins Fresh or frozen whole meats, poultry (not injected with sodium), and fish with no sauce added. Unsalted nuts. Dried peas, beans, and lentils without added salt. Unsalted canned beans. Eggs. Unsalted nut butters. Low-sodium canned tuna or chicken. Dairy Milk. Soy milk. Yogurt. Low-sodium cheeses, such as Swiss, Monterey Jack, [place], and [employer]. Sherbet or ice cream (keep to  cup per serving). Cream cheese. Fats and oils Unsalted butter or margarine. Other foods Homemade pudding. Sodium-free baking soda and baking powder. Herbs and spices. Low-sodium seasoning mixes. Beverages Coffee and tea. Carbonated beverages. The items listed above may not be a complete list of foods low in sodium. Actual amounts of sodium may be different depending on processing. Contact a dietitian for more information. What are some salt alternatives when cooking? The following are herbs, seasonings, and spices that can be used instead of salt to flavor your food. Herbs should be fresh or dried. Do not choose packaged mixes. Next to the name of the herb, spice, or seasoning are some examples of foods you can pair it with. Herbs Bay leaves - Soups, meat and vegetable dishes, and spaghetti sauce. Basil - [employer], soups, pasta, and fish  dishes. Cilantro - Meat, poultry, and vegetable dishes. Chili powder - Marinades and Mexican dishes. Chives - Salad dressings and potato dishes. Cumin - Mexican dishes, couscous, and meat dishes. Dill - Fish dishes, sauces, and salads. Fennel - Meat and vegetable dishes, breads, and cookies. Garlic (do not use garlic salt) - [place] dishes, meat dishes, salad dressings, and sauces. Marjoram - Soups, potato dishes, and meat dishes. Oregano - Pizza and spaghetti sauce. Parsley - Salads, soups, pasta, and meat dishes. Rosemary - [place] dishes, salad dressings, soups, and red meats.

## 2022-05-24 NOTE — Telephone Encounter (Signed)
Spoke to [place] at [facility] and they report they filled several medications on 3/16 and he picked up.  -Omeprazole  -Digoxin -Eliquis -Requip -Symbicort  -Certrizine  -Sidenafil  -Losartan    He is missing Spironolactone and Farxiga and waiting for refills from the doctor. I will send message to triage for same.     I spoke to [place] and verified same and reminded him of his appointment he will bring medications into clinic tomorrow for visit at 10:00.   [name], [place] 05/24/2022

## 2022-05-24 NOTE — Patient Instructions (Signed)
Visit Information  Thank you for taking time to visit with me today. Please don't hesitate to contact me if I can be of assistance to you.   Following are the goals we discussed today:   Goals Addressed             This Visit's Progress    Patient requesting housing resources       Care Coordination Interventions: Discussed housing options (ie. Assisted Living, shelter, living with family, Independent living) Housing resources will be provided to patient mail Patient agreed to contact [employer] to inquire about the reduction of his benefits         Our next appointment is by telephone on 05/29/22 at 11am  Please call the care guide team at [phone] if you need to cancel or reschedule your appointment.   If you are experiencing a Mental Health or [place] or need someone to talk to, please call 911  Patient verbalizes understanding of instructions and care plan provided today and agrees to view in [place]. Active MyChart status and patient understanding of how to access instructions and care plan via MyChart confirmed with patient.     Telephone follow up appointment with care management team member scheduled for:05/29/22 at [place], [place] Worker [facility] Care Management  [phone]

## 2022-05-24 NOTE — Telephone Encounter (Signed)
[place] to request the following for refills:   Digoxin Omeprazole Ranexa Crestor Arlyce Harman  Eliquis? Requip ? Wilder Glade?   She said she would fill what could be filled- I advised her as of last week he was out of the ones in the top list and the bottom 3 listed he probably needs now also.  She is not able to tell me right now which ones can be filled.   She also said she needs to check on his address to see if it can be delivered or not.  She said he was on bubble packs and couldn't tell if they had delivered for him or not in the past. The lady that does that is on lunch.   I gave her heathers # to contact once she finds this information out.    Marylouise Stacks, Paramedic [phone] 05/24/22

## 2022-05-24 NOTE — Patient Outreach (Signed)
  Care Coordination   Initial Visit Note   05/24/2022 Name: Bruce [name] MRN: [id number] DOB: [birth year]  Bruce [name] is a 77 y.o. year old male who sees [name], NP for primary care. I spoke with  Bruce [name] by phone 05/23/22.  What matters to the patients health and wellness today?  Patient is requesting assistance with housing as he is currently living with a family friend.  Various options discussed, including local shelters, Assisted Living, [employer], [place], and other family/friends.  Patients personal preference is living independently and is only interested in low income housing at this time.     Goals Addressed             This Visit's Progress    Patient requesting housing resources       Care Coordination Interventions: Discussed housing options (ie. Assisted Living, shelter, living with family, Independent living) Housing resources will be provided to patient mail Patient agreed to contact [employer] to inquire about the reduction of his benefits         SDOH assessments and interventions completed:  Yes  SDOH Interventions Today    Flowsheet Row Most Recent Value  SDOH Interventions   Food Insecurity Interventions Intervention Not Indicated  [Patient rec's assistance for food through his insurance company that he states is sufficient for monthly food]  Housing Interventions Other (Comment)  [Patient has is living temporarily on a friends couch and need to find housing asap]  Transportation Interventions Intervention Not Indicated  [patient has a vehicle and has a friend to help him drive when needed]        Care Coordination Interventions:  Yes, provided  Interventions Today    Flowsheet Row Most Recent Value  Chronic Disease   Chronic disease during today's visit Chronic Obstructive Pulmonary Disease (COPD), Congestive Heart Failure (CHF)  General Interventions   General Interventions Discussed/Reviewed General Interventions  Discussed  [Patient is referred to contact [employer] regarding reduction in benefits.  SW will provide housing resources by mail as patient is not comfortable with using computers.]       Follow up plan: Follow up call scheduled for 05/29/22 at 11am.    Encounter Outcome:  Pt. Visit Completed

## 2022-05-24 NOTE — Patient Outreach (Signed)
Care Coordination   Follow Up Visit Note   05/24/2022 Name: Bruce [name] MRN: [id number] DOB: Jan 21, 1946  Bruce [name] is a 77 y.o. year old male who sees [name], NP for primary care. I spoke with  Bruce [name] by phone today.  What matters to the patients health and wellness today?  [facility] confirms [facility] providing therapy services.  He states Paramedicine nurse follows him weekly for medication checks and pill box refill. Patient states he is concerned he doesn't have all of his medications yet. Patient states he is currently staying with a friend, [name] who is assisting him with his care.   He states he has spoken with the social worker regarding housing needs.  Patient states he continues to have some shortness of breath. He states the shortness of breath is not as bad as it has been. Patient states he is on Oxygen at 3 L.  He states he does not stay on the oxygen around the clock but states he thinks he was told to.  Patient states he is unable to carry the E-tanks around with him because they are to big. He states he would like to have a portable oxygen device.  Patient denies having a pulse oximetry to check oxygen levels. He states he notices if he does not wear the oxygen with activity he more short of breath.  Pamala Hurry called with [employer] to discuss process for ordering portable oxygen device.  Pamala Hurry states order is needed from provider for evaluation for portable tank.  Rolm Bookbinder with primary care provider to arrange patients post hospital follow up appointment.  Appointment scheduled for 06/01/22 at 9:30 am.  Patient has been notified.     Goals Addressed             This Visit's Progress    Post hospital follow up / management of health conditions       Interventions Today    Flowsheet Row Most Recent Value  Chronic Disease   Chronic disease during today's visit Congestive Heart Failure (CHF), Chronic Obstructive Pulmonary Disease  (COPD), Other  [Pulmonary embolism]  General Interventions   General Interventions Discussed/Reviewed General Interventions Discussed, Durable Medical Equipment (DME)  [Evaluation of current treatment plan related to HF, COPD, PE and patients adherence to plan as established by provider. PCP office called to schedule post hospital follow up visit.]  Durable Medical Equipment (DME) Oxygen  [[employer] called for guidance on requesting portable O2 device.  Message sent to patients PCP requesting order portable oxygen evaluation.]  Exercise Interventions   Exercise Discussed/Reviewed Exercise Discussed  Kathlynn Grate if home health therapy services started. Encouraged patient to continue home PT recommended exercises daily.]  Education Interventions   Education Provided Provided Printed Education, Provided Education  [Confirmed patient has workable scales.  Advised to weigh daily and record. Discussed importance of weighing daily.  Assessed for pulse oximeter.  Worked with patient to contact insurance carrier for assistance with pulse oximetry coverage.]  Provided Verbal Education On Other  [Confirmed patient has someone to help him with reading education information. Discussed heart failure action plan and symptoms. Education article for heart failure sent to patient on heart failure management.]  Nutrition Interventions   Nutrition Discussed/Reviewed Decreasing salt, Nutrition Discussed  [discussed importance of following a low salt, heart healthy diet and education article sent to patient.]  Pharmacy Interventions   Pharmacy Dicussed/Reviewed Pharmacy Topics Discussed  [patient advised that medications will be discussed at  heart failure clinic appointment on 05/25/22. Discussed paramedicine weekly follow up.  Advised patient to take all of his medications and list to heart failure clinic appointment on 05/25/22.]  Safety Interventions   Safety Discussed/Reviewed Safety Discussed  [Encouraged patient to use  walker as recommended for balance/ stability.]     Advised patient importance of wearing Oxygen as recommended by his provider. Discussed importance of wearing oxygen consistently versus not and effects of low oxygen on the brain/ body and other vital organs. Message sent to [name] with paramedicine informing her of patients scheduled post hospital follow up visit and assistance with obtaining portable oxygen.          SDOH assessments and interventions completed:  No     Care Coordination Interventions:  Yes, provided   Follow up plan: Follow up call scheduled for 06/07/2022    Encounter Outcome:  Pt. Visit Completed   Quinn Plowman RN,BSN,CCM [place] [phone] direct line

## 2022-05-25 ENCOUNTER — Ambulatory Visit (HOSPITAL_COMMUNITY)
Admission: RE | Admit: 2022-05-25 | Discharge: 2022-05-25 | Disposition: A | Discharge status: Home / Self Care | Payer: 59 | Source: Ambulatory Visit | Attending: Cardiology | Admitting: Cardiology

## 2022-05-25 ENCOUNTER — Other Ambulatory Visit (HOSPITAL_COMMUNITY): Payer: Self-pay

## 2022-05-25 ENCOUNTER — Telehealth: Payer: Self-pay | Admitting: Student in an Organized Health Care Education/Training Program

## 2022-05-25 ENCOUNTER — Encounter (HOSPITAL_COMMUNITY): Payer: Self-pay

## 2022-05-25 VITALS — BP 98/60 | HR 62 | Wt 159.6 lb

## 2022-05-25 DIAGNOSIS — Z7901 Long term (current) use of anticoagulants: Secondary | ICD-10-CM | POA: Diagnosis not present

## 2022-05-25 DIAGNOSIS — J449 Chronic obstructive pulmonary disease, unspecified: Secondary | ICD-10-CM | POA: Diagnosis not present

## 2022-05-25 DIAGNOSIS — R911 Solitary pulmonary nodule: Secondary | ICD-10-CM | POA: Insufficient documentation

## 2022-05-25 DIAGNOSIS — Z87891 Personal history of nicotine dependence: Secondary | ICD-10-CM | POA: Insufficient documentation

## 2022-05-25 DIAGNOSIS — I2699 Other pulmonary embolism without acute cor pulmonale: Secondary | ICD-10-CM | POA: Insufficient documentation

## 2022-05-25 DIAGNOSIS — J9611 Chronic respiratory failure with hypoxia: Secondary | ICD-10-CM | POA: Diagnosis not present

## 2022-05-25 DIAGNOSIS — C189 Malignant neoplasm of colon, unspecified: Secondary | ICD-10-CM | POA: Diagnosis not present

## 2022-05-25 DIAGNOSIS — Z79899 Other long term (current) drug therapy: Secondary | ICD-10-CM | POA: Insufficient documentation

## 2022-05-25 DIAGNOSIS — I5022 Chronic systolic (congestive) heart failure: Secondary | ICD-10-CM | POA: Diagnosis not present

## 2022-05-25 DIAGNOSIS — R252 Cramp and spasm: Secondary | ICD-10-CM | POA: Diagnosis not present

## 2022-05-25 DIAGNOSIS — I82509 Chronic embolism and thrombosis of unspecified deep veins of unspecified lower extremity: Secondary | ICD-10-CM | POA: Insufficient documentation

## 2022-05-25 DIAGNOSIS — I2510 Atherosclerotic heart disease of native coronary artery without angina pectoris: Secondary | ICD-10-CM | POA: Insufficient documentation

## 2022-05-25 DIAGNOSIS — Z8546 Personal history of malignant neoplasm of prostate: Secondary | ICD-10-CM | POA: Insufficient documentation

## 2022-05-25 DIAGNOSIS — I480 Paroxysmal atrial fibrillation: Secondary | ICD-10-CM | POA: Insufficient documentation

## 2022-05-25 DIAGNOSIS — I428 Other cardiomyopathies: Secondary | ICD-10-CM | POA: Insufficient documentation

## 2022-05-25 DIAGNOSIS — R42 Dizziness and giddiness: Secondary | ICD-10-CM | POA: Diagnosis not present

## 2022-05-25 DIAGNOSIS — I110 Hypertensive heart disease with heart failure: Secondary | ICD-10-CM | POA: Diagnosis not present

## 2022-05-25 LAB — COMPREHENSIVE METABOLIC PANEL WITH GFR
ALT: 34 U/L (ref 0–44)
AST: 26 U/L (ref 15–41)
Albumin: 3.1 g/dL — ABNORMAL LOW (ref 3.5–5.0)
Alkaline Phosphatase: 92 U/L (ref 38–126)
Anion gap: 9 (ref 5–15)
BUN: 13 mg/dL (ref 8–23)
CO2: 23 mmol/L (ref 22–32)
Calcium: 9.8 mg/dL (ref 8.9–10.3)
Chloride: 106 mmol/L (ref 98–111)
Creatinine, Ser: 1.09 mg/dL (ref 0.61–1.24)
GFR, Estimated: >60 mL/min
Glucose, Bld: 102 mg/dL — ABNORMAL HIGH (ref 70–99)
Potassium: 5.2 mmol/L — ABNORMAL HIGH (ref 3.5–5.1)
Sodium: 138 mmol/L (ref 135–145)
Total Bilirubin: 0.7 mg/dL (ref 0.3–1.2)
Total Protein: 6.9 g/dL (ref 6.5–8.1)

## 2022-05-25 LAB — DIGOXIN LEVEL: Digoxin Level: 0.3 ng/mL — ABNORMAL LOW (ref 0.8–2.0)

## 2022-05-25 LAB — CBC
HCT: 38.1 % — ABNORMAL LOW (ref 39.0–52.0)
Hemoglobin: 12.3 g/dL — ABNORMAL LOW (ref 13.0–17.0)
MCH: 30.4 pg (ref 26.0–34.0)
MCHC: 32.3 g/dL (ref 30.0–36.0)
MCV: 94.1 fL (ref 80.0–100.0)
Platelets: 370 10*3/uL (ref 150–400)
RBC: 4.05 MIL/uL — ABNORMAL LOW (ref 4.22–5.81)
RDW: 14.8 % (ref 11.5–15.5)
WBC: 11.7 10*3/uL — ABNORMAL HIGH (ref 4.0–10.5)
nRBC: 0 % (ref 0.0–0.2)

## 2022-05-25 LAB — TSH: TSH: 6.358 u[IU]/mL — ABNORMAL HIGH (ref 0.350–4.500)

## 2022-05-25 LAB — BRAIN NATRIURETIC PEPTIDE: B Natriuretic Peptide: 335.8 pg/mL — ABNORMAL HIGH (ref 0.0–100.0)

## 2022-05-25 MED ORDER — AMIODARONE HCL 200 MG PO TABS: 200.0000 mg | ORAL_TABLET | Freq: Every day | ORAL | 11 refills | Status: DC

## 2022-05-25 MED ORDER — SPIRONOLACTONE 25 MG PO TABS: 12.5000 mg | ORAL_TABLET | Freq: Every day | ORAL | 1 refills | Status: DC

## 2022-05-25 MED ORDER — SPIRONOLACTONE 25 MG PO TABS: 25.0000 mg | ORAL_TABLET | Freq: Every day | ORAL | 1 refills | Status: DC

## 2022-05-25 MED ORDER — LOSARTAN POTASSIUM 25 MG PO TABS: 12.5000 mg | ORAL_TABLET | Freq: Every day | ORAL | 1 refills | Status: DC

## 2022-05-25 MED ORDER — FARXIGA 10 MG PO TABS: 10.0000 mg | ORAL_TABLET | Freq: Every day | ORAL | 1 refills | Status: DC

## 2022-05-25 NOTE — Progress Notes (Signed)
ReDS Vest / Clip - 05/25/22 1000       ReDS Vest / Clip   Station Marker C    Ruler Value 31    ReDS Value Range Low volume    ReDS Actual Value 24

## 2022-05-25 NOTE — Progress Notes (Signed)
Paramedicine Encounter    Patient ID: Bruce [name], male    DOB: March 15, 1945, 77 y.o.   MRN: [id number]   Met with Bruce [name] in clinic today where he was seen by NP today and was evaluated. [name] assessed patient and reviewed medications. Suggesting moving Losartan and Spironolactone to bedtime. We scheduled [facility] appointment for May 15th at 1130.  Pharmacy visit with HF clinic scheduled for Monday April 1st at 1500.  Labs obtained today and B. Simmons NP ordered to stop Spironolactone after reviewing same. He will need to have repeat labs. I spoke to his roommate Bruce [name] who reports she will remove spironolactone from pill box for me until our next visit.   Clinic visit complete. I will see Bruce [name] in one week.   Bruce [name], [place] 05/25/2022    Patient Care Team: [name], NP as PCP - General (Nurse Practitioner) [name], MD as PCP - Cardiology (Cardiology)  Patient Active Problem List   Diagnosis Date Noted   Chronic systolic heart failure ([place]) 05/04/2022   Pulmonary hypertension, unspecified ([place]) [id number]   Acute systolic HF (heart failure) ([place]) 04/13/2022   Pulmonary embolism ([place]) 04/09/2022   Acute respiratory failure with hypoxia ([place]) 04/09/2022   Lactic acidosis 04/09/2022   Hypokalemia 04/09/2022   COPD with acute exacerbation ([place]) 05/03/2020   PAF (paroxysmal atrial fibrillation) ([place])    Syncope 12/31/2018   Intracranial bleed ([place]) 12/31/2018   AKI (acute kidney injury) ([place])    Frontal lobe contusion ([place])    Multiple closed facial bone fractures (HCC)    NSTEMI (non-ST elevated myocardial infarction) ([place]) 10/10/2018   Recurrent syncope 04/28/2018   Malignant neoplasm of prostate ([place]) 07/13/2016   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   Visual hallucination 03/18/2015   Auditory hallucination 03/18/2015   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 01/01/2015   Tibial plateau  fracture 12/29/2014   Limb ischemia 11/18/2013   Blunt trauma of lower leg 10/08/2013   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[place]) 10/08/2013   Acute blood loss anemia 10/08/2013   Anxiety disorder 10/08/2013   Dyslipidemia 10/08/2013   GERD (gastroesophageal reflux disease) 10/08/2013   History of MI (myocardial infarction) 10/08/2013   Anticoagulated 10/08/2013   Hypertension    Tibia/fibula fracture 10/06/2013   CAD (coronary artery disease) 08/16/2010   Edema 08/16/2010   COPD (chronic obstructive pulmonary disease) ([place]) 08/16/2010   DVT of leg (deep venous thrombosis) ([place]) 08/16/2010   Leg cramps 08/16/2010    Current Outpatient Medications:    albuterol (PROVENTIL HFA;VENTOLIN HFA) 108 (90 Base) MCG/ACT inhaler, Inhale 2 puffs into the lungs every 6 (six) hours as needed for wheezing or shortness of breath., Disp: 1 Inhaler, Rfl: 2   amiodarone (PACERONE) 200 MG tablet, Take 1 tablet (200 mg total) by mouth daily., Disp: 30 tablet, Rfl: 11   apixaban (ELIQUIS) 5 MG TABS tablet, Take 1 tablet (5 mg total) by mouth 2 (two) times daily., Disp: 60 tablet, Rfl: 3   budesonide-formoterol (SYMBICORT) 160-4.5 MCG/ACT inhaler, Inhale 2 puffs into the lungs 2 (two) times daily., Disp: 10.2 g, Rfl: 3   cetirizine (ZYRTEC) 10 MG tablet, Take 1 tablet (10 mg total) by mouth daily., Disp: 30 tablet, Rfl: 11   digoxin (LANOXIN) 0.125 MG tablet, Take 1 tablet (0.125 mg total) by mouth daily., Disp: 30 tablet, Rfl: 6   FARXIGA 10 MG TABS tablet, Take 1 tablet (10 mg total) by mouth daily. (Patient not taking: Reported on 05/25/2022),  Disp: 90 tablet, Rfl: 1   fluticasone (FLONASE) 50 MCG/ACT nasal spray, Place 1 spray into both nostrils daily., Disp: , Rfl:    lidocaine (LIDODERM) 5 %, Place 1 patch onto the skin daily. Remove & Discard patch within 12 hours or as directed by MD, Disp: 30 patch, Rfl: 11   losartan (COZAAR) 25 MG tablet, Take 0.5 tablets (12.5 mg total) by mouth at bedtime., Disp: 45  tablet, Rfl: 1   Multiple Vitamin (MULTIVITAMIN) tablet, Take 1 tablet by mouth daily. For Men, Disp: , Rfl:    nitroGLYCERIN (NITROSTAT) 0.4 MG SL tablet, Place 0.4 mg under the tongue every 5 (five) minutes as needed for chest pain. , Disp: , Rfl:    Omega-3 Fatty Acids (FISH OIL PO), Take 1,000 mg by mouth every evening., Disp: , Rfl:    omeprazole (PRILOSEC) 40 MG capsule, Take 40 mg by mouth daily., Disp: , Rfl:    ranolazine (RANEXA) 500 MG 12 hr tablet, Take 1 tablet (500 mg total) by mouth 2 (two) times daily. (Patient not taking: Reported on 05/25/2022), Disp: 60 tablet, Rfl: 6   rOPINIRole (REQUIP) 1 MG tablet, TAKE ONE TABLET BY MOUTH EVERY NIGHT AT BEDTIME FOR RESTLESS LEGS, Disp: 30 tablet, Rfl: 3   rosuvastatin (CRESTOR) 10 MG tablet, Take 1 tablet (10 mg total) by mouth daily., Disp: 30 tablet, Rfl: 6   sildenafil (REVATIO) 20 MG tablet, Take 1 tablet (20 mg total) by mouth 3 (three) times daily., Disp: 90 tablet, Rfl: 0   spironolactone (ALDACTONE) 25 MG tablet, Take 0.5 tablets (12.5 mg total) by mouth at bedtime., Disp: 45 tablet, Rfl: 1 Allergies  Allergen Reactions   Adhesive [Tape] Other (See Comments)    After right leg fracture surgery, pt developed a large blister where tape was applied to his right leg. OK to use paper tape.   Imdur [Isosorbide Dinitrate] Other (See Comments)    hallucinations   Singulair [Montelukast Sodium] Other (See Comments)    Hallucinations      Social History   Socioeconomic History   Marital status: Single    Spouse name: Not on file   Number of children: 2   Years of education: Not on file   Highest education level: 6th grade  Occupational History   Not on file  Tobacco Use   Smoking status: Some Days    Packs/day: 0.50    Years: 52.00    Additional pack years: 0.00    Total pack years: 26.00    Types: Cigarettes   Smokeless tobacco: Never  Vaping Use   Vaping Use: Never used  Substance and Sexual Activity   Alcohol use:  No    Alcohol/week: 0.0 standard drinks of alcohol    Comment: Stopped 2009   Drug use: No   Sexual activity: Not Currently  Other Topics Concern   Not on file  Social History Narrative   ** Merged History Encounter **       Social Determinants of Health   Financial Resource Strain: High Risk (04/12/2022)   Overall Financial Resource Strain (CARDIA)    Difficulty of Paying Living Expenses: Hard  Food Insecurity: No Food Insecurity (05/23/2022)   Hunger Vital Sign    Worried About Running Out of Food in the Last Year: Never true    Ran Out of Food in the Last Year: Never true  Transportation Needs: No Transportation Needs (05/23/2022)   PRAPARE - Hydrologist (Medical): No  Lack of Transportation (Non-Medical): No  Physical Activity: Inactive (02/05/2017)   Exercise Vital Sign    Days of Exercise per Week: 0 days    Minutes of Exercise per Session: 0 min  Stress: Stress Concern Present (02/05/2017)   [place]    Feeling of Stress : Rather much  Social Connections: Moderately Isolated (02/05/2017)   Social Connection and Isolation Panel [NHANES]    Frequency of Communication with Friends and Family: More than three times a week    Frequency of Social Gatherings with Friends and Family: Once a week    Attends Religious Services: Never    Marine scientist or Organizations: No    Attends Archivist Meetings: Never    Marital Status: Divorced  Human resources officer Violence: Not At Risk (04/14/2022)   Humiliation, Afraid, Rape, and Kick questionnaire    Fear of Current or Ex-Partner: No    Emotionally Abused: No    Physically Abused: No    Sexually Abused: No    Physical Exam      Future Appointments  Date Time Provider [place]  05/29/2022 11:00 AM Tresa Res, LCSW [facility] None  06/01/2022  9:30 AM [name], NP [facility] None  06/05/2022  3:00 PM  [facility] None  06/07/2022 10:00 AM Dannielle Karvonen, RN [facility] None  06/21/2022 11:40 AM Bensimhon, Shaune Pascal, MD [facility] None  07/20/2022 11:30 AM Armando Reichert, MD [facility] None     ACTION: Home visit completed

## 2022-05-25 NOTE — Progress Notes (Signed)
Medication Samples have been provided to the patient.  Drug name: Wilder Glade       Strength: 10 mg         Qty: 7  LOT: [id number]  Exp.Date: 01/03/25  Dosing instructions: Patient takes 1 tablet by mouth daily.  The patient has been instructed regarding the correct time, dose, and frequency of taking this medication, including desired effects and most common side effects.   Tiney Rouge Elnora Quizon 10:40 AM 05/25/2022

## 2022-05-25 NOTE — Telephone Encounter (Signed)
I have called Mr. Kodama to schedule his CT in [place] but he wants me to call him back the person that takes care of all his appts is asleep right now

## 2022-05-25 NOTE — Patient Instructions (Signed)
DECREASE Spironolactone to 12.5 mg (one half) tab nightly at bedtime CHANGE Losartan to nightly at bedtime  Labs today We will only contact you if something comes back abnormal or we need to make some changes. Otherwise no news is good news!  Your physician recommends that you schedule a follow-up appointment in: 2 weeks with the pharmacy team  Do the following things EVERYDAY: Weigh yourself in the morning before breakfast. Write it down and keep it in a log. Take your medicines as prescribed Eat low salt foods--Limit salt (sodium) to 2000 mg per day.  Stay as active as you can everyday Limit all fluids for the day to less than 2 liters  At the [facility], you and your health needs are our priority. As part of our continuing mission to provide you with exceptional heart care, we have created designated Provider Care Teams. These Care Teams include your primary Cardiologist (physician) and Advanced Practice Providers (APPs- Physician Assistants and Nurse Practitioners) who all work together to provide you with the care you need, when you need it.   You may see any of the following providers on your designated Care Team at your next follow up: Dr Glori Bickers Dr Loralie Champagne Dr. Roxana Hires, NP Lyda Jester, [place] [facility] [place], [place] Forestine Na, NP Audry Riles, PharmD   Please be sure to bring in all your medications bottles to every appointment.    Thank you for choosing [facility]   If you have any questions or concerns before your next appointment please send [place] a message through [place] or call our office at [phone].    TO LEAVE A MESSAGE FOR THE NURSE SELECT OPTION 2, PLEASE LEAVE A MESSAGE INCLUDING: YOUR NAME DATE OF BIRTH CALL BACK NUMBER REASON FOR CALL**this is important as we prioritize the call backs  YOU WILL RECEIVE A CALL BACK THE SAME DAY AS LONG AS YOU  CALL BEFORE 4:00 PM

## 2022-05-25 NOTE — Progress Notes (Signed)
PCP: [name] NP Primary Cardiologist: Dr Haroldine Laws   HPI: [name] is a 77 y/o with COPD and ongoing tobacco use, CAD, systolic HF EF [id number], colon CA previous lung CA, prostate cancer, PE, DVT, and HFrEF.   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 Missed several doses of xarelto prior to admit. Also had a/c LE DVT.  EF also found to be down to 20-25%. RV severely reduced. Cath minimal CAD, moderate to severe PAH with evidence of cor pulmonale (LPAP > RPAP - checked multiple times). GDMT limited by BP. Entresto lowered BP to much so he was switched to losartan. Discharged 04/18/22 to SNF. Discharge to home 05/05/22.   He presents today for routine f/u and further titration of GDMT. He his now enrolled in paramedicine. Presents today w/ Nira Conn and his wife.   Remains on home O2, 2L/min baseline. Has not had to increase. Still SOB w/ activity, NYHA Class II-early III. Fully compliant w/ meds including Eliquis. He reports feeling lightheaded and dizzy w/ positional changes. No syncope/ near syncope. Ran out of farxiga 4 days ago. ReDs 24%. Has occasional nose bleeds but no other bleeding. Weight at home 160-162  pounds. Stable at 159 lb in clinic today. EKG shows NSR 70 bpm. QRS 92 bpm.     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    [place] 04/13/2022 Ao = 122/78 (96) LV = 123/6 RA = 4 RV = 77/8 LPA = 73/23 (39) RPA = 85/24 (44)  PCW = 16 Fick cardiac output/index = 4.3/2.2 PVR = 5.0  Ao sat = 90% PA sat = 51%, 50%  Echo 04/2022   1. Left ventricular ejection fraction, by estimation, is 20 to 25%. The  left ventricle has severely decreased function. The left ventricle  demonstrates global hypokinesis. The left ventricular internal cavity size  was mildly dilated. Left ventricular   diastolic parameters are consistent with Grade I diastolic dysfunction  (impaired relaxation).   2. Mildly D-shaped interventricular septum suggests RV pressure/volume  overload. Right ventricular systolic function is severely reduced. The  right ventricular size is moderately enlarged. Tricuspid regurgitation  signal is inadequate for assessing PA  pressure.   3. The mitral valve is normal in structure. No evidence of mitral valve  regurgitation. No evidence of mitral stenosis.   4. The tricuspid valve is abnormal. Tricuspid valve regurgitation is mild  to moderate.   5. The aortic valve is tricuspid. There is mild calcification of the  aortic valve. Aortic valve regurgitation is not visualized. No aortic  stenosis is present.  ROS: All systems negative except as listed in HPI, PMH and Problem List.  SH:  Social History   Socioeconomic History   Marital status: Single    Spouse name: Not on file   Number of children: 2   Years of education: Not on file   Highest education level: 6th grade  Occupational History   Not on file  Tobacco Use   Smoking status: Some Days    Packs/day: 0.50    Years: 52.00    Additional pack years: 0.00    Total pack years: 26.00    Types: Cigarettes   Smokeless tobacco: Never  Vaping Use   Vaping Use: Never used  Substance and Sexual Activity   Alcohol use: No  Alcohol/week: 0.0 standard drinks of alcohol    Comment: Stopped 2009   Drug use: No   Sexual activity: Not Currently  Other Topics Concern   Not on file  Social History Narrative   ** Merged History Encounter **       Social Determinants of Health   Financial Resource Strain: High Risk (04/12/2022)   Overall Financial Resource Strain (CARDIA)    Difficulty of Paying Living Expenses: Hard  Food Insecurity: No Food Insecurity (05/23/2022)   Hunger Vital Sign    Worried About Running Out of Food in the Last Year: Never true    Ran Out of Food in the Last Year: Never true   Transportation Needs: No Transportation Needs (05/23/2022)   PRAPARE - Hydrologist (Medical): No    Lack of Transportation (Non-Medical): No  Physical Activity: Inactive (02/05/2017)   Exercise Vital Sign    Days of Exercise per Week: 0 days    Minutes of Exercise per Session: 0 min  Stress: Stress Concern Present (02/05/2017)   [place]    Feeling of Stress : Rather much  Social Connections: Moderately Isolated (02/05/2017)   Social Connection and Isolation Panel [NHANES]    Frequency of Communication with Friends and Family: More than three times a week    Frequency of Social Gatherings with Friends and Family: Once a week    Attends Religious Services: Never    Marine scientist or Organizations: No    Attends Archivist Meetings: Never    Marital Status: Divorced  Human resources officer Violence: Not At Risk (04/14/2022)   Humiliation, Afraid, Rape, and Kick questionnaire    Fear of Current or Ex-Partner: No    Emotionally Abused: No    Physically Abused: No    Sexually Abused: No    FH:  Family History  Problem Relation Age of Onset   Hypertension Mother    Prostate cancer Brother    Mental illness Neg Hx     Past Medical History:  Diagnosis Date   Anginal pain ([place])    Left side if chest ,NTG  relieves chaes apin 12/01/13   Anxiety    Arthritis    Cancer ([place])    colon cancer   CHF (congestive heart failure) ([place])    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   UTI (urinary tract infection)    frequent UTI    Current Outpatient Medications  Medication Sig Dispense Refill   albuterol (PROVENTIL HFA;VENTOLIN HFA) 108 (90 Base) MCG/ACT inhaler Inhale 2 puffs into the lungs every 6 (six) hours as needed for wheezing or shortness of breath. 1 Inhaler 2   amiodarone (PACERONE) 200 MG tablet Take 200 mg by mouth daily.     apixaban (ELIQUIS) 5 MG TABS tablet Take 1 tablet (5 mg total) by mouth 2 (two) times  daily. 60 tablet 3   budesonide-formoterol (SYMBICORT) 160-4.5 MCG/ACT inhaler Inhale 2 puffs into the lungs 2 (two) times daily. 10.2 g 3   cetirizine (ZYRTEC) 10 MG tablet Take 1 tablet (10 mg total) by mouth daily. 30 tablet 11   digoxin (LANOXIN) 0.125 MG tablet Take 1 tablet (0.125 mg total) by mouth daily. 30 tablet 6   FARXIGA 10 MG TABS tablet Take 1 tablet (10 mg total) by mouth daily. 30 tablet    fluticasone (FLONASE) 50 MCG/ACT nasal spray Place 1 spray into both nostrils daily.     lidocaine (LIDODERM) 5 % Place 1 patch onto the skin daily. Remove & Discard patch within 12 hours or as directed by MD 30 patch 11   losartan (COZAAR) 25 MG tablet Take 1/2 tablet (12.5 mg total) by mouth daily. 30 tablet 0   Multiple Vitamin (MULTIVITAMIN) tablet Take 1 tablet by mouth daily. For Men (Patient not taking: Reported on 05/15/2022)     nitroGLYCERIN (NITROSTAT) 0.4 MG SL tablet Place 0.4 mg under the tongue every 5 (five) minutes as needed for chest pain.      nystatin (MYCOSTATIN) 100000 UNIT/ML suspension Take by mouth. (Patient not taking: Reported on 05/08/2022)     Omega-3 Fatty Acids (FISH OIL PO) Take 1,000 mg by mouth every evening.  (Patient not taking: Reported on 05/15/2022)     omeprazole (PRILOSEC) 40 MG capsule Take 40 mg by mouth daily.     ranolazine (RANEXA) 500 MG 12 hr tablet Take 1 tablet (500 mg total) by mouth 2 (two) times daily. 60 tablet 6   rOPINIRole (REQUIP)  1 MG tablet TAKE ONE TABLET BY MOUTH EVERY NIGHT AT BEDTIME FOR RESTLESS LEGS 30 tablet 3   rosuvastatin (CRESTOR) 10 MG tablet Take 1 tablet (10 mg total) by mouth daily. 30 tablet 6   sildenafil (REVATIO) 20 MG tablet Take 1 tablet (20 mg total) by mouth 3 (three) times daily. 90 tablet 0   spironolactone (ALDACTONE) 25 MG tablet Take 1 tablet (25 mg total) by mouth daily.     No current facility-administered medications for this visit.    There were no vitals filed for this visit.  Wt Readings from Last 3 Encounters:  05/15/22 74.8 kg (165 lb)  05/04/22 73.5 kg (162 lb)  04/18/22 71.2 kg (156 lb 15.5 oz)    PHYSICAL EXAM: General:  thin chronically ill appearing WM, in WC . Using [place] HEENT: normal Neck: supple. no JVD. Carotids 2+ bilat; no bruits. No lymphadenopathy or thyromegaly appreciated. Cor: PMI nondisplaced. Regular rate & rhythm. No rubs, gallops or murmurs. Lungs: clear Abdomen: soft, nontender, nondistended. No hepatosplenomegaly. No bruits or masses. Good bowel sounds. Extremities: no cyanosis, clubbing, rash, edema Neuro: alert & oriented x 3, cranial nerves grossly intact. moves all 4 extremities w/o difficulty. Affect pleasant.    ECG:NSR 70 bpm personally checked.    ASSESSMENT & PLAN: 1. Chronic Submassive PE/ A/c LE DVT  - 04/10/22 S/P Mechanical Thrombectomy R Pulmonary Artery  - RHC w/ moderate to severe PAH with evidence of cor pulmonale  - Continue Eliquis 5 mg twice a day indefinitely.  Reports some nosebleeds. Will check CBC  - Continue sildenafil 20 mg tid  - Will need eventual VQ scan (can do outpatient in several months)   2. Chronic Hypoxic Respiratory Failure - COPD at b/l + submassive PE - Continue oxygen 3 liters [place]> Sats stable.   3. PAF  - EKG today shows NSR -Continue  amio 200 mg daily.  Check TSH and CMP today  -Continue Eliquis 5 mg twice a day.    4. Chronic HFrEF  - Echo EF down 20-25% with D shaped septum RV Failure, EF  previously 40-45%.  - R/LHC c/w severe NICM. Minimal CAD, moderate to severe PAH with evidence of cor pulmonale - NYHA II-early III. Volume status ok on exam, ReDs 24%. BP soft w/ occasional positional dizziness - Restart Farxiga 10 mg daily  - Reduce Spiro to 12.5 mg and change to QHS - Continue losartan 12.5 mg, switch to QHS. BP too soft for Entresto  - Continue spironolactone  25 mg daily.  - Continue Digoxin 0.125 and check digoxin level  - does not need loop diuretic currently  - Repeat ECHO in 3 months after HF meds optimized.  - C/w paramedicine. Appreciate their assistance     5. Lung nodule - Needs follow up CT in 3 months.  - Needs follow up with [facility]    6. Former Smoker  - quit, congratulated on efforts   F/u w/ PharmD in 2 more weeks to try to push GDMT if BP allows. Keep f/u w/ Dr. Haroldine Laws in 4 wks.   Lilybeth Vien PA-C   7:21 AM

## 2022-05-26 DIAGNOSIS — Z7984 Long term (current) use of oral hypoglycemic drugs: Secondary | ICD-10-CM | POA: Diagnosis not present

## 2022-05-26 DIAGNOSIS — R911 Solitary pulmonary nodule: Secondary | ICD-10-CM | POA: Diagnosis not present

## 2022-05-26 DIAGNOSIS — H3530 Unspecified macular degeneration: Secondary | ICD-10-CM | POA: Diagnosis not present

## 2022-05-26 DIAGNOSIS — I252 Old myocardial infarction: Secondary | ICD-10-CM | POA: Diagnosis not present

## 2022-05-26 DIAGNOSIS — I5022 Chronic systolic (congestive) heart failure: Secondary | ICD-10-CM | POA: Diagnosis not present

## 2022-05-26 DIAGNOSIS — Z9181 History of falling: Secondary | ICD-10-CM | POA: Diagnosis not present

## 2022-05-26 DIAGNOSIS — I2699 Other pulmonary embolism without acute cor pulmonale: Secondary | ICD-10-CM | POA: Diagnosis not present

## 2022-05-26 DIAGNOSIS — Z7901 Long term (current) use of anticoagulants: Secondary | ICD-10-CM | POA: Diagnosis not present

## 2022-05-26 DIAGNOSIS — I110 Hypertensive heart disease with heart failure: Secondary | ICD-10-CM | POA: Diagnosis not present

## 2022-05-26 DIAGNOSIS — I2510 Atherosclerotic heart disease of native coronary artery without angina pectoris: Secondary | ICD-10-CM | POA: Diagnosis not present

## 2022-05-26 DIAGNOSIS — Z7951 Long term (current) use of inhaled steroids: Secondary | ICD-10-CM | POA: Diagnosis not present

## 2022-05-26 DIAGNOSIS — K219 Gastro-esophageal reflux disease without esophagitis: Secondary | ICD-10-CM | POA: Diagnosis not present

## 2022-05-26 DIAGNOSIS — Z9981 Dependence on supplemental oxygen: Secondary | ICD-10-CM | POA: Diagnosis not present

## 2022-05-26 DIAGNOSIS — Z48812 Encounter for surgical aftercare following surgery on the circulatory system: Secondary | ICD-10-CM | POA: Diagnosis not present

## 2022-05-26 DIAGNOSIS — J9600 Acute respiratory failure, unspecified whether with hypoxia or hypercapnia: Secondary | ICD-10-CM | POA: Diagnosis not present

## 2022-05-26 DIAGNOSIS — I480 Paroxysmal atrial fibrillation: Secondary | ICD-10-CM | POA: Diagnosis not present

## 2022-05-26 DIAGNOSIS — M25552 Pain in left hip: Secondary | ICD-10-CM | POA: Diagnosis not present

## 2022-05-26 DIAGNOSIS — E871 Hypo-osmolality and hyponatremia: Secondary | ICD-10-CM | POA: Diagnosis not present

## 2022-05-26 DIAGNOSIS — Z85038 Personal history of other malignant neoplasm of large intestine: Secondary | ICD-10-CM | POA: Diagnosis not present

## 2022-05-26 DIAGNOSIS — J441 Chronic obstructive pulmonary disease with (acute) exacerbation: Secondary | ICD-10-CM | POA: Diagnosis not present

## 2022-05-26 NOTE — Telephone Encounter (Signed)
I called Mr. Martucci again and he told me to call [name]. I called [name] and Mr. Jaecks CT has been scheduled at [facility] on 07/11/2022 @ 11:30am. I gave [name] the address along with our address here at the office

## 2022-05-29 ENCOUNTER — Ambulatory Visit: Payer: Self-pay

## 2022-05-29 NOTE — Patient Outreach (Signed)
  Care Coordination   Follow Up Visit Note Late note  05/30/2022 Name: [name] MRN: [id number] DOB: 01-23-1946  Bruce [name] is a 77 y.o. year old male who sees [name], NP for primary care. I spoke with  Bruce [name] by phone today.  What matters to the patients health and wellness today?  What matters to the patients health and wellness today?  Patient is looking for housing.  He needs a first floor unit due to mobility concerns, but has not been able to locate an apartment he can afford.  He has contacted [employer] but the hold time is too long and he agrees to visit in person.    Goals Addressed             This Visit's Progress    Housing assistance       Care Coordination Interventions: Patient will call back if he has not received the housing list CM mailed by the end of the week Patient will continue to attempts to contact [place] regarding deductions from his SSA benefits Patient agrees to visit [employer] by 4/01 if not sooner Patient friends will continue to look for housing on the first level due to mobility issues [facility] staff is currently providing PT, one day a week Paramedicine staff due to visit today and will address his medication concerns         SDOH assessments and interventions completed:  No     Care Coordination Interventions:    Interventions Today    Flowsheet Row Most Recent Value  General Interventions   General Interventions Discussed/Reviewed General Interventions Reviewed, Museum/gallery exhibitions officer to continue looking for housing and connect with [employer] regarding benefits]       Interventions Today    Flowsheet Row Most Recent Value  General Interventions   General Interventions Discussed/Reviewed General Interventions Reviewed, Museum/gallery exhibitions officer to continue looking for housing and connect with [employer] regarding benefits]       Follow up plan: Follow up call scheduled for 06/06/22 at 11am    Encounter Outcome:  Pt.  Visit Completed   Lindie Spruce, [place] Worker [facility] Care Management  [phone]

## 2022-05-30 DIAGNOSIS — Z7984 Long term (current) use of oral hypoglycemic drugs: Secondary | ICD-10-CM | POA: Diagnosis not present

## 2022-05-30 DIAGNOSIS — K219 Gastro-esophageal reflux disease without esophagitis: Secondary | ICD-10-CM | POA: Diagnosis not present

## 2022-05-30 DIAGNOSIS — I2699 Other pulmonary embolism without acute cor pulmonale: Secondary | ICD-10-CM | POA: Diagnosis not present

## 2022-05-30 DIAGNOSIS — Z9981 Dependence on supplemental oxygen: Secondary | ICD-10-CM | POA: Diagnosis not present

## 2022-05-30 DIAGNOSIS — Z85038 Personal history of other malignant neoplasm of large intestine: Secondary | ICD-10-CM | POA: Diagnosis not present

## 2022-05-30 DIAGNOSIS — Z9181 History of falling: Secondary | ICD-10-CM | POA: Diagnosis not present

## 2022-05-30 DIAGNOSIS — I2510 Atherosclerotic heart disease of native coronary artery without angina pectoris: Secondary | ICD-10-CM | POA: Diagnosis not present

## 2022-05-30 DIAGNOSIS — I480 Paroxysmal atrial fibrillation: Secondary | ICD-10-CM | POA: Diagnosis not present

## 2022-05-30 DIAGNOSIS — H3530 Unspecified macular degeneration: Secondary | ICD-10-CM | POA: Diagnosis not present

## 2022-05-30 DIAGNOSIS — M25552 Pain in left hip: Secondary | ICD-10-CM | POA: Diagnosis not present

## 2022-05-30 DIAGNOSIS — J441 Chronic obstructive pulmonary disease with (acute) exacerbation: Secondary | ICD-10-CM | POA: Diagnosis not present

## 2022-05-30 DIAGNOSIS — J9600 Acute respiratory failure, unspecified whether with hypoxia or hypercapnia: Secondary | ICD-10-CM | POA: Diagnosis not present

## 2022-05-30 DIAGNOSIS — E871 Hypo-osmolality and hyponatremia: Secondary | ICD-10-CM | POA: Diagnosis not present

## 2022-05-30 DIAGNOSIS — I110 Hypertensive heart disease with heart failure: Secondary | ICD-10-CM | POA: Diagnosis not present

## 2022-05-30 DIAGNOSIS — I5022 Chronic systolic (congestive) heart failure: Secondary | ICD-10-CM | POA: Diagnosis not present

## 2022-05-30 DIAGNOSIS — Z48812 Encounter for surgical aftercare following surgery on the circulatory system: Secondary | ICD-10-CM | POA: Diagnosis not present

## 2022-05-30 DIAGNOSIS — Z7951 Long term (current) use of inhaled steroids: Secondary | ICD-10-CM | POA: Diagnosis not present

## 2022-05-30 DIAGNOSIS — R911 Solitary pulmonary nodule: Secondary | ICD-10-CM | POA: Diagnosis not present

## 2022-05-30 DIAGNOSIS — Z7901 Long term (current) use of anticoagulants: Secondary | ICD-10-CM | POA: Diagnosis not present

## 2022-05-30 DIAGNOSIS — I252 Old myocardial infarction: Secondary | ICD-10-CM | POA: Diagnosis not present

## 2022-05-30 NOTE — Patient Instructions (Signed)
Visit Information  Thank you for taking time to visit with me today. Please don't hesitate to contact me if I can be of assistance to you.   Following are the goals we discussed today:   Goals Addressed             This Visit's Progress    Housing assistance       Care Coordination Interventions: Patient will call back if he has not received the housing list CM mailed by the end of the week Patient will continue to attempts to contact [place] regarding deductions from his SSA benefits Patient agrees to visit [employer] by 4/01 if not sooner Patient friends will continue to look for housing on the first level due to mobility issues [facility] staff is currently providing PT, one day a week Paramedicine staff due to visit today and will address his medication concerns         Our next appointment is by telephone on 06/06/22 at 11am  Please call the care guide team at [phone] if you need to cancel or reschedule your appointment.   If you are experiencing a Mental Health or [place] or need someone to talk to, please call 911  Patient verbalizes understanding of instructions and care plan provided today and agrees to view in [place]. Active MyChart status and patient understanding of how to access instructions and care plan via MyChart confirmed with patient.     Telephone follow up appointment with care management team member scheduled for:06/06/22  Lindie Spruce, [place] Worker [facility] Care Management  [phone]

## 2022-05-31 ENCOUNTER — Telehealth: Payer: Self-pay

## 2022-05-31 DIAGNOSIS — H3530 Unspecified macular degeneration: Secondary | ICD-10-CM | POA: Diagnosis not present

## 2022-05-31 DIAGNOSIS — J441 Chronic obstructive pulmonary disease with (acute) exacerbation: Secondary | ICD-10-CM | POA: Diagnosis not present

## 2022-05-31 DIAGNOSIS — M25552 Pain in left hip: Secondary | ICD-10-CM | POA: Diagnosis not present

## 2022-05-31 DIAGNOSIS — R911 Solitary pulmonary nodule: Secondary | ICD-10-CM | POA: Diagnosis not present

## 2022-05-31 DIAGNOSIS — I480 Paroxysmal atrial fibrillation: Secondary | ICD-10-CM | POA: Diagnosis not present

## 2022-05-31 DIAGNOSIS — I252 Old myocardial infarction: Secondary | ICD-10-CM | POA: Diagnosis not present

## 2022-05-31 DIAGNOSIS — K219 Gastro-esophageal reflux disease without esophagitis: Secondary | ICD-10-CM | POA: Diagnosis not present

## 2022-05-31 DIAGNOSIS — Z7951 Long term (current) use of inhaled steroids: Secondary | ICD-10-CM | POA: Diagnosis not present

## 2022-05-31 DIAGNOSIS — Z85038 Personal history of other malignant neoplasm of large intestine: Secondary | ICD-10-CM | POA: Diagnosis not present

## 2022-05-31 DIAGNOSIS — J9600 Acute respiratory failure, unspecified whether with hypoxia or hypercapnia: Secondary | ICD-10-CM | POA: Diagnosis not present

## 2022-05-31 DIAGNOSIS — I2510 Atherosclerotic heart disease of native coronary artery without angina pectoris: Secondary | ICD-10-CM | POA: Diagnosis not present

## 2022-05-31 DIAGNOSIS — I2699 Other pulmonary embolism without acute cor pulmonale: Secondary | ICD-10-CM | POA: Diagnosis not present

## 2022-05-31 DIAGNOSIS — I5022 Chronic systolic (congestive) heart failure: Secondary | ICD-10-CM | POA: Diagnosis not present

## 2022-05-31 DIAGNOSIS — E871 Hypo-osmolality and hyponatremia: Secondary | ICD-10-CM | POA: Diagnosis not present

## 2022-05-31 DIAGNOSIS — I110 Hypertensive heart disease with heart failure: Secondary | ICD-10-CM | POA: Diagnosis not present

## 2022-05-31 DIAGNOSIS — Z9181 History of falling: Secondary | ICD-10-CM | POA: Diagnosis not present

## 2022-05-31 DIAGNOSIS — Z7901 Long term (current) use of anticoagulants: Secondary | ICD-10-CM | POA: Diagnosis not present

## 2022-05-31 DIAGNOSIS — Z48812 Encounter for surgical aftercare following surgery on the circulatory system: Secondary | ICD-10-CM | POA: Diagnosis not present

## 2022-05-31 DIAGNOSIS — Z9981 Dependence on supplemental oxygen: Secondary | ICD-10-CM | POA: Diagnosis not present

## 2022-05-31 DIAGNOSIS — Z7984 Long term (current) use of oral hypoglycemic drugs: Secondary | ICD-10-CM | POA: Diagnosis not present

## 2022-06-01 ENCOUNTER — Other Ambulatory Visit (HOSPITAL_COMMUNITY): Payer: Self-pay

## 2022-06-01 ENCOUNTER — Ambulatory Visit (INDEPENDENT_AMBULATORY_CARE_PROVIDER_SITE_OTHER): Payer: 59 | Admitting: Nurse Practitioner

## 2022-06-01 ENCOUNTER — Encounter: Payer: Self-pay | Admitting: Nurse Practitioner

## 2022-06-01 VITALS — BP 138/74 | HR 49 | Ht 73.0 in | Wt 161.6 lb

## 2022-06-01 DIAGNOSIS — I2510 Atherosclerotic heart disease of native coronary artery without angina pectoris: Secondary | ICD-10-CM

## 2022-06-01 DIAGNOSIS — J42 Unspecified chronic bronchitis: Secondary | ICD-10-CM

## 2022-06-01 DIAGNOSIS — F419 Anxiety disorder, unspecified: Secondary | ICD-10-CM

## 2022-06-01 DIAGNOSIS — I10 Essential (primary) hypertension: Secondary | ICD-10-CM | POA: Diagnosis not present

## 2022-06-01 DIAGNOSIS — K219 Gastro-esophageal reflux disease without esophagitis: Secondary | ICD-10-CM

## 2022-06-01 DIAGNOSIS — J449 Chronic obstructive pulmonary disease, unspecified: Secondary | ICD-10-CM | POA: Diagnosis not present

## 2022-06-01 NOTE — Patient Instructions (Signed)
1) Needs Cards follow up appt from hospital stay 2) Doesn't wear his portable oxygen because he cannot roll the tank due to exertion 3) Follow up appt in 3 months, 2 weeks, labs done at that visit

## 2022-06-01 NOTE — Progress Notes (Signed)
Established Patient Office Visit  Subjective:  Patient ID: Bruce [name], male    DOB: 1945/05/01  Age: 77 y.o. MRN: [id number]  Chief Complaint  Patient presents with   Hospitalization [facility] follow up    Hospital follow up, pt reports clots in lungs.  On oxygen on 24/7, patient cannot drag large tank behind him.  Patient is on blood thinner again and is cold.      No other concerns at this time.   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   UTI (urinary tract infection)    frequent UTI    Past Surgical History:  Procedure Laterality Date   CARDIAC CATHETERIZATION     CHOLECYSTECTOMY     EXTERNAL FIXATION LEG Right 10/06/2013   Procedure: CLOSED REDUCTION RIGHT TIBIAL PLATEAU FRACTURE, EXTERNAL FIXATION RIGHT LEG, PLACEMENT OF WOUND VAC;  Surgeon: Rozanna Box, MD;  Location: [place];  Service: Orthopedics;  Laterality: Right;   FEMORAL-POPLITEAL BYPASS GRAFT Right 10/06/2013   Procedure: RIGHT POPLITEAL-POPLITEAL ARTERY BYPASS GRAFT;  Surgeon: Rosetta Posner, MD;  Location: [place];  Service: Vascular;  Laterality: Right;   FRACTURE SURGERY Right 2014   HARDWARE REMOVAL Right 12/02/2013   Procedure: REMOVAL EXTERNAL FIXATION RIGHT LEG ;  Surgeon: Rozanna Box, MD;  Location: [place];   Service: Orthopedics;  Laterality: Right;   HERNIA REPAIR Right 1990's   I & D EXTREMITY Right 10/09/2013   Procedure: IRRIGATION AND DEBRIDEMENT RIGHT LEG, CLOSURE  OF WOUNDS, PLACEMENT OF WOUND VAC ON EACH SIDE OF LEG;  Surgeon: Rozanna Box, MD;  Location: [place];  Service: Orthopedics;  Laterality: Right;   IR ANGIOGRAM PULMONARY RIGHT SELECTIVE  04/10/2022   IR ANGIOGRAM SELECTIVE EACH ADDITIONAL VESSEL  04/10/2022   IR NEPHROSTOMY PLACEMENT LEFT  06/19/2016   IR THROMBECT PRIM MECH INIT (INCLU) MOD SED  04/10/2022   IR US GUIDE VASC ACCESS RIGHT  04/10/2022   IVC filter  2009   placed @ [facility]/ Removed in 2010.   IVC Filter Removed     LEFT HEART CATH AND CORONARY ANGIOGRAPHY Right 10/11/2018   Procedure: LEFT HEART CATH AND CORONARY ANGIOGRAPHY;  Surgeon: [name], MD;  Location: [place] [facility];  Service: Cardiovascular;  Laterality: Right;   ligament leg Left    NEPHROLITHOTOMY Left 06/19/2016   Procedure: NEPHROLITHOTOMY PERCUTANEOUS;  Surgeon: Hollice Espy, MD;  Location: [facility];  Service: Urology;  Laterality: Left;   ORIF TIBIA FRACTURE Right 12/29/2014   Procedure: OPEN REDUCTION INTERNAL FIXATION (ORIF) RIGHT TIBIA FRACTURE, RIA VS ICBG;  Surgeon: Altamese [place], MD;  Location: [place];  Service: Orthopedics;  Laterality: Right;   RADIOACTIVE SEED IMPLANT N/A 09/12/2016   Procedure: RADIOACTIVE SEED IMPLANT/BRACHYTHERAPY IMPLANT;  Surgeon: Hollice Espy, MD;  Location: [facility];  Service: Urology;  Laterality: N/A;   RIGHT/LEFT HEART CATH AND CORONARY ANGIOGRAPHY N/A 04/13/2022   Procedure: RIGHT/LEFT HEART CATH AND CORONARY ANGIOGRAPHY;  Surgeon: Jolaine Artist, MD;  Location: [place] [facility];  Service: Cardiovascular;  Laterality: N/A;    Social History   Socioeconomic History   Marital status: Single    Spouse name: Not on file   Number of children: 2   Years of education: Not on file   Highest education level: 6th grade  Occupational History   Not on file   Tobacco Use   Smoking status: Some Days    Packs/day: 0.50    Years: 52.00    Additional pack years: 0.00    Total pack years: 26.00    Types: Cigarettes   Smokeless tobacco: Never  Vaping Use   Vaping Use: Never used  Substance and Sexual Activity   Alcohol use: No    Alcohol/week: 0.0 standard drinks of alcohol    Comment: Stopped 2009   Drug use: No   Sexual activity: Not Currently  Other Topics Concern   Not on file  Social History Narrative   ** Merged History Encounter **       Social Determinants of Health   Financial Resource Strain: High Risk (04/12/2022)   Overall Financial Resource Strain (CARDIA)    Difficulty of Paying Living Expenses: Hard  Food Insecurity: No Food Insecurity (05/23/2022)   Hunger Vital Sign    Worried About Running Out of Food in the Last Year: Never true    Ran Out of Food in the Last Year: Never true  Transportation Needs: No Transportation Needs (05/23/2022)   PRAPARE - Hydrologist (Medical): No    Lack of Transportation (Non-Medical): No  Physical Activity: Inactive (02/05/2017)   Exercise Vital Sign    Days of Exercise per Week: 0 days    Minutes of Exercise per Session: 0 min  Stress: Stress Concern Present (02/05/2017)   [place]    Feeling of Stress : Rather much  Social Connections: Moderately Isolated (02/05/2017)   Social Connection and Isolation Panel [NHANES]    Frequency of Communication with Friends and Family: More than three times a week    Frequency of Social Gatherings with Friends and Family: Once a week    Attends Religious Services: Never    Marine scientist or Organizations: No    Attends Archivist Meetings: Never    Marital Status: Divorced  Human resources officer Violence: Not At Risk (04/14/2022)   Humiliation, Afraid, Rape, and Kick questionnaire    Fear of Current or Ex-Partner: No    Emotionally Abused:  No    Physically Abused: No    Sexually Abused: No    Family History  Problem Relation Age of Onset   Hypertension Mother    Prostate cancer Brother    Mental illness Neg Hx     Allergies  Allergen Reactions   Adhesive [Tape] Other (See Comments)    After right leg fracture surgery, pt developed a large blister where tape was applied to his right leg. OK to use paper tape.   Imdur [Isosorbide Dinitrate] Other (See Comments)    hallucinations   Singulair [Montelukast Sodium] Other (  See Comments)    Hallucinations     Review of Systems  Constitutional:  Positive for malaise/fatigue.  HENT: Negative.    Eyes: Negative.   Respiratory:  Positive for shortness of breath.   Cardiovascular: Negative.   Gastrointestinal: Negative.   Genitourinary: Negative.   Musculoskeletal: Negative.   Skin: Negative.   Neurological: Negative.   Endo/Heme/Allergies: Negative.   Psychiatric/Behavioral: Negative.         Objective:   BP 138/74   Pulse (!) 49   Ht 6\' 1"  (1.854 m)   Wt 161 lb 9.6 oz (73.3 kg)   SpO2 (!) 78%   BMI 21.32 kg/m   Vitals:   06/01/22 0929  BP: 138/74  Pulse: (!) 49  Height: 6\' 1"  (1.854 m)  Weight: 161 lb 9.6 oz (73.3 kg)  SpO2: (!) 78%  BMI (Calculated): 21.33    Physical Exam Vitals reviewed.  Constitutional:      Appearance: Normal appearance.  HENT:     Head: Normocephalic.     Nose: Nose normal.     Mouth/Throat:     Mouth: Mucous membranes are moist.  Eyes:     Pupils: Pupils are equal, round, and reactive to light.  Cardiovascular:     Rate and Rhythm: Normal rate and regular rhythm.  Pulmonary:     Effort: Pulmonary effort is normal.     Breath sounds: Normal breath sounds.  Abdominal:     General: Bowel sounds are normal.     Palpations: Abdomen is soft.  Musculoskeletal:        General: Tenderness present.     Cervical back: Normal range of motion and neck supple.  Skin:    General: Skin is warm and dry.  Neurological:      Mental Status: He is alert and oriented to person, place, and time.  Psychiatric:        Mood and Affect: Mood normal.        Behavior: Behavior normal.      No results found for any visits on 06/01/22.  Recent Results (from the past 2160 hour(s))  Magnesium     Status: None   Collection Time: 04/09/22  4:45 PM  Result Value Ref Range   Magnesium 1.8 1.7 - 2.4 mg/dL    Comment: Performed at [facility] [address], [place], [place] [address]  Brain natriuretic peptide (order ONLY if patient c/o SOB)     Status: Abnormal   Collection Time: 04/09/22  4:45 PM  Result Value Ref Range   B Natriuretic Peptide 537.6 (H) 0.0 - 100.0 pg/mL    Comment: Performed at [facility] [address] [place] [address]  Comprehensive metabolic panel     Status: Abnormal   Collection Time: 04/09/22  4:45 PM  Result Value Ref Range   Sodium 142 135 - 145 mmol/L   Potassium 3.5 3.5 - 5.1 mmol/L   Chloride 112 (H) 98 - 111 mmol/L   CO2 18 (L) 22 - 32 mmol/L   Glucose, Bld 133 (H) 70 - 99 mg/dL    Comment: Glucose reference range applies only to samples taken after fasting for at least 8 hours.   BUN 12 8 - 23 mg/dL   Creatinine, Ser 1.07 0.61 - 1.24 mg/dL   Calcium 8.8 (L) 8.9 - 10.3 mg/dL   Total Protein 6.4 (L) 6.5 - 8.1 g/dL   Albumin 3.4 (L) 3.5 - 5.0 g/dL   AST 31  15 - 41 U/L   ALT 23 0 - 44 U/L   Alkaline Phosphatase 83 38 - 126 U/L   Total Bilirubin 1.7 (H) 0.3 - 1.2 mg/dL   GFR, Estimated >60 >60 mL/min    Comment: (NOTE) Calculated using the CKD-EPI Creatinine Equation (2021)    Anion gap 12 5 - 15    Comment: Performed at [facility] [address] [place], [place] [address]  Digoxin level     Status: Abnormal   Collection Time: 04/09/22  4:45 PM  Result Value Ref Range   Digoxin Level <0.2 (L) 0.8 - 2.0 ng/mL    Comment: RESULT CONFIRMED BY MANUAL DILUTION Performed at [facility], [place] [address], [place], [place] [address]   CBC  with Differential/Platelet     Status: None   Collection Time: 04/09/22  4:45 PM  Result Value Ref Range   WBC 8.8 4.0 - 10.5 K/uL   RBC 4.45 4.22 - 5.81 MIL/uL   Hemoglobin 13.8 13.0 - 17.0 g/dL   HCT 41.8 39.0 - 52.0 %   MCV 93.9 80.0 - 100.0 fL   MCH 31.0 26.0 - 34.0 pg   MCHC 33.0 30.0 - 36.0 g/dL   RDW 14.1 11.5 - 15.5 %   Platelets 162 150 - 400 K/uL   nRBC 0.0 0.0 - 0.2 %   Neutrophils Relative % 84 %   Neutro Abs 7.4 1.7 - 7.7 K/uL   Lymphocytes Relative 11 %   Lymphs Abs 1.0 0.7 - 4.0 K/uL   Monocytes Relative 4 %   Monocytes Absolute 0.4 0.1 - 1.0 K/uL   Eosinophils Relative 0 %   Eosinophils Absolute 0.0 0.0 - 0.5 K/uL   Basophils Relative 1 %   Basophils Absolute 0.0 0.0 - 0.1 K/uL   Immature Granulocytes 0 %   Abs Immature Granulocytes 0.03 0.00 - 0.07 K/uL    Comment: Performed at [facility], [address] [place], [place] [address]  Protime-INR     Status: Abnormal   Collection Time: 04/09/22  4:45 PM  Result Value Ref Range   Prothrombin Time 17.0 (H) 11.4 - 15.2 seconds   INR 1.4 (H) 0.8 - 1.2    Comment: (NOTE) INR goal varies based on device and disease states. Performed at [facility], [place] [address], [place], [place] [address]   Troponin I (High Sensitivity)     Status: Abnormal   Collection Time: 04/09/22  4:45 PM  Result Value Ref Range   Troponin I (High Sensitivity) 231 (HH) <18 ng/L    Comment: CRITICAL RESULT CALLED TO, READ BACK BY AND VERIFIED WITH S,LARIVIERE RN @1735  04/09/22 E,[place] (NOTE) Elevated high sensitivity troponin I (hsTnI) values and significant  changes across serial measurements may suggest ACS but many other  chronic and acute conditions are known to elevate hsTnI results.  Refer to the "Links" section for chest pain algorithms and additional  guidance. Performed at [facility], [place] [address], [place], [place] [address]   Resp panel by RT-PCR (RSV, Flu A&B, Covid) Anterior Nasal Swab     Status:  None   Collection Time: 04/09/22  4:57 PM   Specimen: Anterior Nasal Swab  Result Value Ref Range   SARS Coronavirus 2 by RT PCR NEGATIVE NEGATIVE   Influenza A by PCR NEGATIVE NEGATIVE   Influenza B by PCR NEGATIVE NEGATIVE    Comment: (NOTE) The Xpert Xpress SARS-CoV-2/FLU/RSV plus assay is  intended as an aid in the diagnosis of influenza from Nasopharyngeal swab specimens and should not be used as a sole basis for treatment. Nasal washings and aspirates are unacceptable for Xpert Xpress SARS-CoV-2/FLU/RSV testing.  Fact Sheet for Patients: EntrepreneurPulse.com.au  Fact Sheet for Healthcare Providers: IncredibleEmployment.be  This test is not yet approved or cleared by the [place] FDA and has been authorized for detection and/or diagnosis of SARS-CoV-2 by FDA under an Emergency Use Authorization (EUA). This EUA will remain in effect (meaning this test can be used) for the duration of the COVID-19 declaration under Section 564(b)(1) of the Act, 21 U.S.C. section 360bbb-3(b)(1), unless the authorization is terminated or revoked.     Resp Syncytial Virus by PCR NEGATIVE NEGATIVE    Comment: (NOTE) Fact Sheet for Patients: EntrepreneurPulse.com.au  Fact Sheet for Healthcare Providers: IncredibleEmployment.be  This test is not yet approved or cleared by the [place] FDA and has been authorized for detection and/or diagnosis of SARS-CoV-2 by FDA under an Emergency Use Authorization (EUA). This EUA will remain in effect (meaning this test can be used) for the duration of the COVID-19 declaration under Section 564(b)(1) of the Act, 21 U.S.C. section 360bbb-3(b)(1), unless the authorization is terminated or revoked.  Performed at [facility], [place] [address] [place], [place] [address]   I-Stat venous blood gas, [facility], [facility], [facility])     Status: Abnormal   Collection Time: 04/09/22  6:05 PM   Result Value Ref Range   pH, Ven 7.374 7.25 - 7.43   pCO2, Ven 25.3 (L) 44 - 60 mmHg   pO2, Ven 37 32 - 45 mmHg   Bicarbonate 14.8 (L) 20.0 - 28.0 mmol/L   TCO2 16 (L) 22 - 32 mmol/L   O2 Saturation 71 %   Acid-base deficit 9.0 (H) 0.0 - 2.0 mmol/L   Sodium 145 135 - 145 mmol/L   Potassium 2.8 (L) 3.5 - 5.1 mmol/L   Calcium, Ion 1.03 (L) 1.15 - 1.40 mmol/L   HCT 33.0 (L) 39.0 - 52.0 %   Hemoglobin 11.2 (L) 13.0 - 17.0 g/dL   Sample type VENOUS    Comment NOTIFIED PHYSICIAN   Troponin I (High Sensitivity)     Status: Abnormal   Collection Time: 04/09/22  6:38 PM  Result Value Ref Range   Troponin I (High Sensitivity) 232 (HH) <18 ng/L    Comment: CRITICAL VALUE NOTED. VALUE IS CONSISTENT WITH PREVIOUSLY REPORTED/CALLED VALUE (NOTE) Elevated high sensitivity troponin I (hsTnI) values and significant  changes across serial measurements may suggest ACS but many other  chronic and acute conditions are known to elevate hsTnI results.  Refer to the "Links" section for chest pain algorithms and additional  guidance. Performed at [facility], [place] [address], [place], [place] [address]   Heparin level (unfractionated)     Status: Abnormal   Collection Time: 04/09/22  7:35 PM  Result Value Ref Range   Heparin Unfractionated <0.10 (L) 0.30 - 0.70 IU/mL    Comment: (NOTE) The clinical reportable range upper limit is being lowered to >1.10 to align with the FDA approved guidance for the current laboratory assay.  If heparin results are below expected values, and patient dosage has  been confirmed, suggest follow up testing of antithrombin III levels. Performed at [facility] [facility], [place] [address], [place], [place] [address]   APTT     Status: None   Collection Time: 04/09/22  7:35 PM  Result Value Ref  Range   aPTT 25 24 - 36 seconds    Comment: Performed at [facility] [address] [place], [place] [address]  Lactic acid, plasma     Status: Abnormal    Collection Time: 04/09/22 10:00 PM  Result Value Ref Range   Lactic Acid, Venous 5.4 (HH) 0.5 - 1.9 mmol/L    Comment: CRITICAL RESULT CALLED TO, READ BACK BY AND VERIFIED WITH Amado Nash RN 04/09/22 2309 Wiliam Ke Performed at [facility], [place] [address], [place], [place] [address]   Lactic acid, plasma     Status: Abnormal   Collection Time: 04/10/22 12:32 AM  Result Value Ref Range   Lactic Acid, Venous 3.5 (HH) 0.5 - 1.9 mmol/L    Comment: CRITICAL VALUE NOTED. VALUE IS CONSISTENT WITH PREVIOUSLY REPORTED/CALLED VALUE Performed at [facility], [place] [address] [place], [place] [address]   MRSA Next Gen by PCR, Nasal     Status: None   Collection Time: 04/10/22  1:09 AM   Specimen: Nasal Mucosa; Nasal Swab  Result Value Ref Range   MRSA by PCR Next Gen NOT DETECTED NOT DETECTED    Comment: (NOTE) The GeneXpert MRSA Assay (FDA approved for NASAL specimens only), is one component of a comprehensive MRSA colonization surveillance program. It is not intended to diagnose MRSA infection nor to guide or monitor treatment for MRSA infections. Test performance is not FDA approved in patients less than 70 years old. Performed at [facility], [place] [address] [place], [place] [address]   Heparin level (unfractionated)     Status: Abnormal   Collection Time: 04/10/22  3:58 AM  Result Value Ref Range   Heparin Unfractionated 1.01 (H) 0.30 - 0.70 IU/mL    Comment: (NOTE) The clinical reportable range upper limit is being lowered to >1.10 to align with the FDA approved guidance for the current laboratory assay.  If heparin results are below expected values, and patient dosage has  been confirmed, suggest follow up testing of antithrombin III levels. Performed at [facility], [place] [address], [place], [place] [address]   APTT     Status: Abnormal   Collection Time: 04/10/22  3:58 AM  Result Value Ref Range   aPTT 139 (H) 24 - 36 seconds    Comment:         IF BASELINE aPTT IS ELEVATED, SUGGEST PATIENT RISK ASSESSMENT BE USED TO DETERMINE APPROPRIATE ANTICOAGULANT THERAPY. Performed at [facility], [place] [address] [place] [address]   CBC     Status: None   Collection Time: 04/10/22  3:58 AM  Result Value Ref Range   WBC 7.4 4.0 - 10.5 K/uL   RBC 4.29 4.22 - 5.81 MIL/uL   Hemoglobin 13.3 13.0 - 17.0 g/dL   HCT 39.4 39.0 - 52.0 %   MCV 91.8 80.0 - 100.0 fL   MCH 31.0 26.0 - 34.0 pg   MCHC 33.8 30.0 - 36.0 g/dL   RDW 14.2 11.5 - 15.5 %   Platelets 154 150 - 400 K/uL   nRBC 0.0 0.0 - 0.2 %    Comment: Performed at [facility], [place] [address], [place], [place] [id number]  Basic metabolic panel     Status: Abnormal   Collection Time: 04/10/22  3:58 AM  Result Value Ref Range   Sodium 140 135 - 145 mmol/L   Potassium 4.8 3.5 - 5.1 mmol/L   Chloride 110  98 - 111 mmol/L   CO2 19 (L) 22 - 32 mmol/L   Glucose, Bld 206 (H) 70 - 99 mg/dL    Comment: Glucose reference range applies only to samples taken after fasting for at least 8 hours.   BUN 15 8 - 23 mg/dL   Creatinine, Ser 0.97 0.61 - 1.24 mg/dL   Calcium 9.6 8.9 - 10.3 mg/dL   GFR, Estimated >60 >60 mL/min    Comment: (NOTE) Calculated using the CKD-EPI Creatinine Equation (2021)    Anion gap 11 5 - 15    Comment: Performed at [facility] [address] [place], [place] [address]  Lactic acid, plasma     Status: Abnormal   Collection Time: 04/10/22  9:01 AM  Result Value Ref Range   Lactic Acid, Venous 2.4 (HH) 0.5 - 1.9 mmol/L    Comment: CRITICAL VALUE NOTED. VALUE IS CONSISTENT WITH PREVIOUSLY REPORTED/CALLED VALUE Performed at [facility], [place] [address] [place], [place] [address]   Troponin I (High Sensitivity)     Status: Abnormal   Collection Time: 04/10/22  9:01 AM  Result Value Ref Range   Troponin I (High Sensitivity) 187 (HH) <18 ng/L    Comment: CRITICAL VALUE NOTED. VALUE IS CONSISTENT WITH PREVIOUSLY REPORTED/CALLED  VALUE (NOTE) Elevated high sensitivity troponin I (hsTnI) values and significant  changes across serial measurements may suggest ACS but many other  chronic and acute conditions are known to elevate hsTnI results.  Refer to the "Links" section for chest pain algorithms and additional  guidance. Performed at [facility], [place] [address] [place], [place] [address]   Brain natriuretic peptide     Status: Abnormal   Collection Time: 04/10/22  9:01 AM  Result Value Ref Range   B Natriuretic Peptide 1,012.6 (H) 0.0 - 100.0 pg/mL    Comment: Performed at [facility] [address], [place], [place] [address]  APTT     Status: Abnormal   Collection Time: 04/10/22  9:01 AM  Result Value Ref Range   aPTT 100 (H) 24 - 36 seconds    Comment:        IF BASELINE aPTT IS ELEVATED, SUGGEST PATIENT RISK ASSESSMENT BE USED TO DETERMINE APPROPRIATE ANTICOAGULANT THERAPY. Performed at [facility], [place] [address] [place], [place] [address]   Heparin level (unfractionated)     Status: Abnormal   Collection Time: 04/10/22  9:01 AM  Result Value Ref Range   Heparin Unfractionated 0.92 (H) 0.30 - 0.70 IU/mL    Comment: (NOTE) The clinical reportable range upper limit is being lowered to >1.10 to align with the FDA approved guidance for the current laboratory assay.  If heparin results are below expected values, and patient dosage has  been confirmed, suggest follow up testing of antithrombin III levels. Performed at [facility], [place] [address] [place], [place] [id number]   Basic metabolic panel     Status: Abnormal   Collection Time: 04/10/22  9:01 AM  Result Value Ref Range   Sodium 135 135 - 145 mmol/L   Potassium 4.4 3.5 - 5.1 mmol/L   Chloride 108 98 - 111 mmol/L   CO2 16 (L) 22 - 32 mmol/L   Glucose, Bld 164 (H) 70 - 99 mg/dL    Comment: Glucose reference range applies only to samples taken after fasting for at least 8 hours.   BUN 17 8 - 23 mg/dL    Creatinine,  Ser 0.98 0.61 - 1.24 mg/dL   Calcium 9.4 8.9 - 10.3 mg/dL   GFR, Estimated >60 >60 mL/min    Comment: (NOTE) Calculated using the CKD-EPI Creatinine Equation (2021)    Anion gap 11 5 - 15    Comment: Performed at [facility] [address], [place], [place] [address]  ECHOCARDIOGRAM COMPLETE     Status: None   Collection Time: 04/10/22 10:46 AM  Result Value Ref Range   Weight 2,574.97 oz   Height 73 in   BP 137/92 mmHg   Single Plane A4C EF 22.4 %   S' Lateral 4.80 cm   Est EF 20 - 25%   Heparin level (unfractionated)     Status: Abnormal   Collection Time: 04/10/22  1:44 PM  Result Value Ref Range   Heparin Unfractionated 1.03 (H) 0.30 - 0.70 IU/mL    Comment: (NOTE) The clinical reportable range upper limit is being lowered to >1.10 to align with the FDA approved guidance for the current laboratory assay.  If heparin results are below expected values, and patient dosage has  been confirmed, suggest follow up testing of antithrombin III levels. Performed at [facility], [name] [address] [place], [place] [address]   Heparin level (unfractionated)     Status: None   Collection Time: 04/10/22  7:45 PM  Result Value Ref Range   Heparin Unfractionated 0.62 0.30 - 0.70 IU/mL    Comment: (NOTE) The clinical reportable range upper limit is being lowered to >1.10 to align with the FDA approved guidance for the current laboratory assay.  If heparin results are below expected values, and patient dosage has  been confirmed, suggest follow up testing of antithrombin III levels. Performed at [facility], [place] [address], [place], [place] [address]   APTT     Status: Abnormal   Collection Time: 04/11/22  6:22 AM  Result Value Ref Range   aPTT 138 (H) 24 - 36 seconds    Comment:        IF BASELINE aPTT IS ELEVATED, SUGGEST PATIENT RISK ASSESSMENT BE USED TO DETERMINE APPROPRIATE ANTICOAGULANT THERAPY. Performed at [facility], [place] [address], [place], [place] [address]   CBC     Status: Abnormal   Collection Time: 04/11/22  6:22 AM  Result Value Ref Range   WBC 11.1 (H) 4.0 - 10.5 K/uL   RBC 3.80 (L) 4.22 - 5.81 MIL/uL   Hemoglobin 12.0 (L) 13.0 - 17.0 g/dL   HCT 35.3 (L) 39.0 - 52.0 %   MCV 92.9 80.0 - 100.0 fL   MCH 31.6 26.0 - 34.0 pg   MCHC 34.0 30.0 - 36.0 g/dL   RDW 14.2 11.5 - 15.5 %   Platelets 167 150 - 400 K/uL   nRBC 0.0 0.0 - 0.2 %    Comment: Performed at [facility], [place] [address] [place], [place] [address]  Heparin level (unfractionated)     Status: Abnormal   Collection Time: 04/11/22  6:22 AM  Result Value Ref Range   Heparin Unfractionated 1.00 (H) 0.30 - 0.70 IU/mL    Comment: (NOTE) The clinical reportable range upper limit is being lowered to >1.10 to align with the FDA approved guidance for the current laboratory assay.  If heparin results are below expected values, and patient dosage has  been confirmed, suggest follow up testing of antithrombin III levels. Performed at [facility], [place] [address],  [place], [place] [address]   Heparin level (unfractionated)     Status: None   Collection Time: 04/11/22  3:14 PM  Result Value Ref Range   Heparin Unfractionated 0.63 0.30 - 0.70 IU/mL    Comment: (NOTE) The clinical reportable range upper limit is being lowered to >1.10 to align with the FDA approved guidance for the current laboratory assay.  If heparin results are below expected values, and patient dosage has  been confirmed, suggest follow up testing of antithrombin III levels. Performed at [facility], [place] [address], [place], [place] [address]   Heparin level (unfractionated)     Status: None   Collection Time: 04/11/22  9:59 PM  Result Value Ref Range   Heparin Unfractionated 0.59 0.30 - 0.70 IU/mL    Comment: (NOTE) The clinical reportable range upper limit is being lowered to >1.10 to align with the FDA approved guidance for the current  laboratory assay.  If heparin results are below expected values, and patient dosage has  been confirmed, suggest follow up testing of antithrombin III levels. Performed at [facility], [place] [address] [place], [place] [address]   Heparin level (unfractionated)     Status: None   Collection Time: 04/12/22 12:21 AM  Result Value Ref Range   Heparin Unfractionated 0.56 0.30 - 0.70 IU/mL    Comment: (NOTE) The clinical reportable range upper limit is being lowered to >1.10 to align with the FDA approved guidance for the current laboratory assay.  If heparin results are below expected values, and patient dosage has  been confirmed, suggest follow up testing of antithrombin III levels. Performed at [facility], [place] [address], [place], [place] [address]   CBC     Status: Abnormal   Collection Time: 04/12/22 12:21 AM  Result Value Ref Range   WBC 10.6 (H) 4.0 - 10.5 K/uL   RBC 3.79 (L) 4.22 - 5.81 MIL/uL   Hemoglobin 11.7 (L) 13.0 - 17.0 g/dL   HCT 35.7 (L) 39.0 - 52.0 %   MCV 94.2 80.0 - 100.0 fL   MCH 30.9 26.0 - 34.0 pg   MCHC 32.8 30.0 - 36.0 g/dL   RDW 13.9 11.5 - 15.5 %   Platelets 168 150 - 400 K/uL   nRBC 0.0 0.0 - 0.2 %    Comment: Performed at [facility], [place] [address] [place], [place] [id number]  Basic metabolic panel     Status: Abnormal   Collection Time: 04/12/22 12:21 AM  Result Value Ref Range   Sodium 138 135 - 145 mmol/L   Potassium 3.8 3.5 - 5.1 mmol/L   Chloride 108 98 - 111 mmol/L   CO2 21 (L) 22 - 32 mmol/L   Glucose, Bld 95 70 - 99 mg/dL    Comment: Glucose reference range applies only to samples taken after fasting for at least 8 hours.   BUN 18 8 - 23 mg/dL   Creatinine, Ser 0.96 0.61 - 1.24 mg/dL   Calcium 8.5 (L) 8.9 - 10.3 mg/dL   GFR, Estimated >60 >60 mL/min    Comment: (NOTE) Calculated using the CKD-EPI Creatinine Equation (2021)    Anion gap 9 5 - 15    Comment: Performed at [facility] [address],  [place], [place] [address]  CBC     Status: Abnormal   Collection Time: 04/13/22 12:14 AM  Result Value Ref Range   WBC 7.4 4.0 - 10.5 K/uL  RBC 3.65 (L) 4.22 - 5.81 MIL/uL   Hemoglobin 11.5 (L) 13.0 - 17.0 g/dL   HCT 33.1 (L) 39.0 - 52.0 %   MCV 90.7 80.0 - 100.0 fL   MCH 31.5 26.0 - 34.0 pg   MCHC 34.7 30.0 - 36.0 g/dL   RDW 13.7 11.5 - 15.5 %   Platelets 168 150 - 400 K/uL   nRBC 0.0 0.0 - 0.2 %    Comment: Performed at [facility] [address], [place], [place] [address]  Heparin level (unfractionated)     Status: None   Collection Time: 04/13/22 12:14 AM  Result Value Ref Range   Heparin Unfractionated 0.33 0.30 - 0.70 IU/mL    Comment: (NOTE) The clinical reportable range upper limit is being lowered to >1.10 to align with the FDA approved guidance for the current laboratory assay.  If heparin results are below expected values, and patient dosage has  been confirmed, suggest follow up testing of antithrombin III levels. Performed at [facility], [place] [address] [place], [place] [id number]   Basic metabolic panel     Status: Abnormal   Collection Time: 04/13/22 12:14 AM  Result Value Ref Range   Sodium 135 135 - 145 mmol/L   Potassium 3.5 3.5 - 5.1 mmol/L   Chloride 106 98 - 111 mmol/L   CO2 22 22 - 32 mmol/L   Glucose, Bld 102 (H) 70 - 99 mg/dL    Comment: Glucose reference range applies only to samples taken after fasting for at least 8 hours.   BUN 17 8 - 23 mg/dL   Creatinine, Ser 1.11 0.61 - 1.24 mg/dL   Calcium 8.5 (L) 8.9 - 10.3 mg/dL   GFR, Estimated >60 >60 mL/min    Comment: (NOTE) Calculated using the CKD-EPI Creatinine Equation (2021)    Anion gap 7 5 - 15    Comment: Performed at [facility] [address], [place], [place] [address]  Magnesium     Status: None   Collection Time: 04/13/22 12:14 AM  Result Value Ref Range   Magnesium 2.0 1.7 - 2.4 mg/dL    Comment: Performed at [facility] [address], [place],  [place] [address]  I-STAT 7, (LYTES, BLD GAS, ICA, H+H)     Status: Abnormal   Collection Time: 04/13/22 11:18 AM  Result Value Ref Range   pH, Arterial 7.434 7.35 - 7.45   pCO2 arterial 26.5 (L) 32 - 48 mmHg   pO2, Arterial 56 (L) 83 - 108 mmHg   Bicarbonate 17.7 (L) 20.0 - 28.0 mmol/L   TCO2 19 (L) 22 - 32 mmol/L   O2 Saturation 90 %   Acid-base deficit 5.0 (H) 0.0 - 2.0 mmol/L   Sodium 144 135 - 145 mmol/L   Potassium 3.6 3.5 - 5.1 mmol/L   Calcium, Ion 1.08 (L) 1.15 - 1.40 mmol/L   HCT 31.0 (L) 39.0 - 52.0 %   Hemoglobin 10.5 (L) 13.0 - 17.0 g/dL   Sample type ARTERIAL   POCT I-Stat EG7     Status: Abnormal   Collection Time: 04/13/22 11:25 AM  Result Value Ref Range   pH, Ven 7.430 7.25 - 7.43   pCO2, Ven 34.7 (L) 44 - 60 mmHg   pO2, Ven 26 (LL) 32 - 45 mmHg   Bicarbonate 23.0 20.0 - 28.0 mmol/L   TCO2 24 22 - 32 mmol/L   O2 Saturation 51 %   Acid-base deficit  1.0 0.0 - 2.0 mmol/L   Sodium 140 135 - 145 mmol/L   Potassium 4.1 3.5 - 5.1 mmol/L   Calcium, Ion 1.27 1.15 - 1.40 mmol/L   HCT 35.0 (L) 39.0 - 52.0 %   Hemoglobin 11.9 (L) 13.0 - 17.0 g/dL   Sample type VENOUS   POCT I-Stat EG7     Status: Abnormal   Collection Time: 04/13/22 11:25 AM  Result Value Ref Range   pH, Ven 7.433 (H) 7.25 - 7.43   pCO2, Ven 35.5 (L) 44 - 60 mmHg   pO2, Ven 26 (LL) 32 - 45 mmHg   Bicarbonate 23.7 20.0 - 28.0 mmol/L   TCO2 25 22 - 32 mmol/L   O2 Saturation 50 %   Acid-Base Excess 0.0 0.0 - 2.0 mmol/L   Sodium 141 135 - 145 mmol/L   Potassium 4.0 3.5 - 5.1 mmol/L   Calcium, Ion 1.28 1.15 - 1.40 mmol/L   HCT 34.0 (L) 39.0 - 52.0 %   Hemoglobin 11.6 (L) 13.0 - 17.0 g/dL   Sample type VENOUS   Heparin level (unfractionated)     Status: Abnormal   Collection Time: 04/14/22 12:19 AM  Result Value Ref Range   Heparin Unfractionated >1.10 (H) 0.30 - 0.70 IU/mL    Comment: (NOTE) The clinical reportable range upper limit is being lowered to >1.10 to align with the FDA approved guidance for  the current laboratory assay.  If heparin results are below expected values, and patient dosage has  been confirmed, suggest follow up testing of antithrombin III levels. Performed at [facility], [place] [address], [place], [place] [address]   CBC     Status: Abnormal   Collection Time: 04/14/22 12:19 AM  Result Value Ref Range   WBC 5.7 4.0 - 10.5 K/uL   RBC 3.76 (L) 4.22 - 5.81 MIL/uL   Hemoglobin 11.5 (L) 13.0 - 17.0 g/dL   HCT 34.6 (L) 39.0 - 52.0 %   MCV 92.0 80.0 - 100.0 fL   MCH 30.6 26.0 - 34.0 pg   MCHC 33.2 30.0 - 36.0 g/dL   RDW 13.8 11.5 - 15.5 %   Platelets 173 150 - 400 K/uL   nRBC 0.0 0.0 - 0.2 %    Comment: Performed at [facility], [place] [address] [place], [place] [id number]  Basic metabolic panel     Status: Abnormal   Collection Time: 04/14/22 12:19 AM  Result Value Ref Range   Sodium 134 (L) 135 - 145 mmol/L   Potassium 3.5 3.5 - 5.1 mmol/L   Chloride 103 98 - 111 mmol/L   CO2 22 22 - 32 mmol/L   Glucose, Bld 118 (H) 70 - 99 mg/dL    Comment: Glucose reference range applies only to samples taken after fasting for at least 8 hours.   BUN 15 8 - 23 mg/dL   Creatinine, Ser 1.03 0.61 - 1.24 mg/dL   Calcium 8.5 (L) 8.9 - 10.3 mg/dL   GFR, Estimated >60 >60 mL/min    Comment: (NOTE) Calculated using the CKD-EPI Creatinine Equation (2021)    Anion gap 9 5 - 15    Comment: Performed at [facility] [address] [place], [place] [address]  Magnesium     Status: None   Collection Time: 04/14/22 12:19 AM  Result Value Ref Range   Magnesium 2.1 1.7 - 2.4 mg/dL    Comment: Performed at [facility] [address]  [place], [place] [address]  CBC     Status: Abnormal   Collection Time: 04/15/22 12:11 AM  Result Value Ref Range   WBC 5.9 4.0 - 10.5 K/uL   RBC 3.71 (L) 4.22 - 5.81 MIL/uL   Hemoglobin 11.5 (L) 13.0 - 17.0 g/dL   HCT 34.6 (L) 39.0 - 52.0 %   MCV 93.3 80.0 - 100.0 fL   MCH 31.0 26.0 - 34.0 pg   MCHC 33.2 30.0 - 36.0  g/dL   RDW 13.9 11.5 - 15.5 %   Platelets 186 150 - 400 K/uL   nRBC 0.0 0.0 - 0.2 %    Comment: Performed at [facility], [place] [address], [place], [place] [id number]  Basic metabolic panel     Status: Abnormal   Collection Time: 04/15/22 12:11 AM  Result Value Ref Range   Sodium 139 135 - 145 mmol/L   Potassium 4.1 3.5 - 5.1 mmol/L   Chloride 107 98 - 111 mmol/L   CO2 24 22 - 32 mmol/L   Glucose, Bld 116 (H) 70 - 99 mg/dL    Comment: Glucose reference range applies only to samples taken after fasting for at least 8 hours.   BUN 14 8 - 23 mg/dL   Creatinine, Ser 1.24 0.61 - 1.24 mg/dL   Calcium 9.0 8.9 - 10.3 mg/dL   GFR, Estimated >60 >60 mL/min    Comment: (NOTE) Calculated using the CKD-EPI Creatinine Equation (2021)    Anion gap 8 5 - 15    Comment: Performed at [facility] [address] [place], [place] [address]  Magnesium     Status: None   Collection Time: 04/15/22 12:11 AM  Result Value Ref Range   Magnesium 2.1 1.7 - 2.4 mg/dL    Comment: Performed at [facility] [address], [place], [place] [address]  CBC     Status: Abnormal   Collection Time: 04/16/22 12:18 AM  Result Value Ref Range   WBC 7.6 4.0 - 10.5 K/uL   RBC 3.82 (L) 4.22 - 5.81 MIL/uL   Hemoglobin 11.8 (L) 13.0 - 17.0 g/dL   HCT 34.9 (L) 39.0 - 52.0 %   MCV 91.4 80.0 - 100.0 fL   MCH 30.9 26.0 - 34.0 pg   MCHC 33.8 30.0 - 36.0 g/dL   RDW 14.1 11.5 - 15.5 %   Platelets 219 150 - 400 K/uL   nRBC 0.0 0.0 - 0.2 %    Comment: Performed at [facility], [place] [address], [place], [place] [id number]  Basic metabolic panel     Status: Abnormal   Collection Time: 04/16/22 12:18 AM  Result Value Ref Range   Sodium 139 135 - 145 mmol/L   Potassium 3.9 3.5 - 5.1 mmol/L   Chloride 108 98 - 111 mmol/L   CO2 23 22 - 32 mmol/L   Glucose, Bld 129 (H) 70 - 99 mg/dL    Comment: Glucose reference range applies only to samples taken after fasting for at least 8 hours.   BUN 12 8 - 23  mg/dL   Creatinine, Ser 1.18 0.61 - 1.24 mg/dL   Calcium 9.1 8.9 - 10.3 mg/dL   GFR, Estimated >60 >60 mL/min    Comment: (NOTE) Calculated using the CKD-EPI Creatinine Equation (2021)    Anion gap 8 5 - 15    Comment: Performed at [facility] [address] [place], [place] [address]  Magnesium  Status: None   Collection Time: 04/16/22 12:18 AM  Result Value Ref Range   Magnesium 2.0 1.7 - 2.4 mg/dL    Comment: Performed at [facility], [place] [address], [place], [place] [address]  CBC     Status: Abnormal   Collection Time: 04/17/22 12:14 AM  Result Value Ref Range   WBC 7.2 4.0 - 10.5 K/uL   RBC 3.79 (L) 4.22 - 5.81 MIL/uL   Hemoglobin 11.6 (L) 13.0 - 17.0 g/dL   HCT 35.9 (L) 39.0 - 52.0 %   MCV 94.7 80.0 - 100.0 fL   MCH 30.6 26.0 - 34.0 pg   MCHC 32.3 30.0 - 36.0 g/dL   RDW 14.3 11.5 - 15.5 %   Platelets 229 150 - 400 K/uL   nRBC 0.0 0.0 - 0.2 %    Comment: Performed at [facility], [place] [address], [place], [place] [id number]  Basic metabolic panel     Status: Abnormal   Collection Time: 04/17/22 12:14 AM  Result Value Ref Range   Sodium 139 135 - 145 mmol/L   Potassium 4.8 3.5 - 5.1 mmol/L   Chloride 108 98 - 111 mmol/L   CO2 21 (L) 22 - 32 mmol/L   Glucose, Bld 105 (H) 70 - 99 mg/dL    Comment: Glucose reference range applies only to samples taken after fasting for at least 8 hours.   BUN 12 8 - 23 mg/dL   Creatinine, Ser 1.19 0.61 - 1.24 mg/dL   Calcium 9.3 8.9 - 10.3 mg/dL   GFR, Estimated >60 >60 mL/min    Comment: (NOTE) Calculated using the CKD-EPI Creatinine Equation (2021)    Anion gap 10 5 - 15    Comment: Performed at [facility] [address], [place], [place] [address]  Magnesium     Status: None   Collection Time: 04/17/22 12:14 AM  Result Value Ref Range   Magnesium 1.9 1.7 - 2.4 mg/dL    Comment: Performed at [facility] [address], [place], [place] [address]  CBC     Status: Abnormal   Collection  Time: 04/18/22 12:01 AM  Result Value Ref Range   WBC 6.0 4.0 - 10.5 K/uL   RBC 3.70 (L) 4.22 - 5.81 MIL/uL   Hemoglobin 11.6 (L) 13.0 - 17.0 g/dL   HCT 34.0 (L) 39.0 - 52.0 %   MCV 91.9 80.0 - 100.0 fL   MCH 31.4 26.0 - 34.0 pg   MCHC 34.1 30.0 - 36.0 g/dL   RDW 14.3 11.5 - 15.5 %   Platelets 224 150 - 400 K/uL   nRBC 0.0 0.0 - 0.2 %    Comment: Performed at [facility], [place] [address] [place], [place] [address]  Magnesium     Status: None   Collection Time: 04/18/22 12:01 AM  Result Value Ref Range   Magnesium 1.8 1.7 - 2.4 mg/dL    Comment: Performed at [facility] [address], [place], [place] [id number]  Basic metabolic panel     Status: None   Collection Time: 04/18/22 12:01 AM  Result Value Ref Range   Sodium 137 135 - 145 mmol/L   Potassium 4.2 3.5 - 5.1 mmol/L   Chloride 106 98 - 111 mmol/L   CO2 23 22 - 32 mmol/L   Glucose, Bld 97 70 - 99 mg/dL    Comment: Glucose reference range applies only to samples taken after  fasting for at least 8 hours.   BUN 11 8 - 23 mg/dL   Creatinine, Ser 1.15 0.61 - 1.24 mg/dL   Calcium 9.0 8.9 - 10.3 mg/dL   GFR, Estimated >60 >60 mL/min    Comment: (NOTE) Calculated using the CKD-EPI Creatinine Equation (2021)    Anion gap 8 5 - 15    Comment: Performed at [facility] [address] [place], [place] [id number]  Basic metabolic panel     Status: Abnormal   Collection Time: 05/04/22 11:15 AM  Result Value Ref Range   Sodium 135 135 - 145 mmol/L   Potassium 3.8 3.5 - 5.1 mmol/L   Chloride 99 98 - 111 mmol/L   CO2 29 22 - 32 mmol/L   Glucose, Bld 108 (H) 70 - 99 mg/dL    Comment: Glucose reference range applies only to samples taken after fasting for at least 8 hours.   BUN 13 8 - 23 mg/dL   Creatinine, Ser 1.25 (H) 0.61 - 1.24 mg/dL   Calcium 9.6 8.9 - 10.3 mg/dL   GFR, Estimated >60 >60 mL/min    Comment: (NOTE) Calculated using the CKD-EPI Creatinine Equation (2021)    Anion gap 7 5 - 15     Comment: Performed at [facility] [address], [place], [place] [address]  CBC     Status: Abnormal   Collection Time: 05/04/22 11:15 AM  Result Value Ref Range   WBC 7.9 4.0 - 10.5 K/uL   RBC 4.07 (L) 4.22 - 5.81 MIL/uL   Hemoglobin 13.0 13.0 - 17.0 g/dL   HCT 38.6 (L) 39.0 - 52.0 %   MCV 94.8 80.0 - 100.0 fL   MCH 31.9 26.0 - 34.0 pg   MCHC 33.7 30.0 - 36.0 g/dL   RDW 14.7 11.5 - 15.5 %   Platelets 300 150 - 400 K/uL   nRBC 0.0 0.0 - 0.2 %    Comment: Performed at [facility], [place] [address], [place], [place] [address]  Comprehensive metabolic panel     Status: Abnormal   Collection Time: 05/25/22 10:46 AM  Result Value Ref Range   Sodium 138 135 - 145 mmol/L   Potassium 5.2 (H) 3.5 - 5.1 mmol/L   Chloride 106 98 - 111 mmol/L   CO2 23 22 - 32 mmol/L   Glucose, Bld 102 (H) 70 - 99 mg/dL    Comment: Glucose reference range applies only to samples taken after fasting for at least 8 hours.   BUN 13 8 - 23 mg/dL   Creatinine, Ser 1.09 0.61 - 1.24 mg/dL   Calcium 9.8 8.9 - 10.3 mg/dL   Total Protein 6.9 6.5 - 8.1 g/dL   Albumin 3.1 (L) 3.5 - 5.0 g/dL   AST 26 15 - 41 U/L   ALT 34 0 - 44 U/L   Alkaline Phosphatase 92 38 - 126 U/L   Total Bilirubin 0.7 0.3 - 1.2 mg/dL   GFR, Estimated >60 >60 mL/min    Comment: (NOTE) Calculated using the CKD-EPI Creatinine Equation (2021)    Anion gap 9 5 - 15    Comment: Performed at [facility] [address], [place], [place] [address]  Brain natriuretic peptide     Status: Abnormal   Collection Time: 05/25/22 10:46 AM  Result Value Ref Range   B Natriuretic Peptide 335.8 (H) 0.0 - 100.0 pg/mL    Comment: Performed at [facility], [address] [place], [place] [address]  Digoxin level     Status: Abnormal   Collection Time: 05/25/22 10:46 AM  Result Value Ref Range   Digoxin Level 0.3 (L) 0.8 - 2.0 ng/mL    Comment: Performed at [facility], [place] [address], [place], [place]  [address]  CBC     Status: Abnormal   Collection Time: 05/25/22 10:46 AM  Result Value Ref Range   WBC 11.7 (H) 4.0 - 10.5 K/uL   RBC 4.05 (L) 4.22 - 5.81 MIL/uL   Hemoglobin 12.3 (L) 13.0 - 17.0 g/dL   HCT 38.1 (L) 39.0 - 52.0 %   MCV 94.1 80.0 - 100.0 fL   MCH 30.4 26.0 - 34.0 pg   MCHC 32.3 30.0 - 36.0 g/dL   RDW 14.8 11.5 - 15.5 %   Platelets 370 150 - 400 K/uL   nRBC 0.0 0.0 - 0.2 %    Comment: Performed at [facility], [place] [address], [place], [place] [address]  TSH     Status: Abnormal   Collection Time: 05/25/22 10:46 AM  Result Value Ref Range   TSH 6.358 (H) 0.350 - 4.500 uIU/mL    Comment: Performed by a 3rd Generation assay with a functional sensitivity of <=0.01 uIU/mL. Performed at [facility], [place] [address], [place], [place] [address]       Assessment & Plan:   Problem List Items Addressed This Visit       Cardiovascular and Mediastinum   CAD (coronary artery disease) - Primary     Respiratory   COPD (chronic obstructive pulmonary disease) (HCC)     Digestive   GERD (gastroesophageal reflux disease) (Chronic)     Other   Anxiety disorder (Chronic)    Return in about 3 months (around 09/01/2022).   Total time spent: 35 minutes  [name], NP  06/01/2022

## 2022-06-01 NOTE — Progress Notes (Signed)
Paramedicine Encounter    Patient ID: Bruce [name], male    DOB: September 24, 1945, 77 y.o.   MRN: [id number]  Met with [facility] in the home today for a med review and pill box refill. I reviewed all meds and filled pill box for one week. No refills needed. He will be seen in HF clinic on Monday for Pharmacy follow up. I will meet him there for this appointment. He is aware to bring all meds and pill box.   He was seen by PCP today so no vitals were obtained. He reports he asked PCP to help him get a portable O2 tank for him to be able to use when he travels to appointments or out of the home but PCP advised patient to talk to his heart doctor about same. I advised [facility] we had done this and they referred him to PCP. I told him we see [facility] in May and hopefully we can have them approve this. He agreed.      Visit complete.   [name], EMT-Paramedic [phone] 06/01/2022   ACTION: Home visit completed

## 2022-06-02 DIAGNOSIS — Z85038 Personal history of other malignant neoplasm of large intestine: Secondary | ICD-10-CM | POA: Diagnosis not present

## 2022-06-02 DIAGNOSIS — Z7984 Long term (current) use of oral hypoglycemic drugs: Secondary | ICD-10-CM | POA: Diagnosis not present

## 2022-06-02 DIAGNOSIS — J9600 Acute respiratory failure, unspecified whether with hypoxia or hypercapnia: Secondary | ICD-10-CM | POA: Diagnosis not present

## 2022-06-02 DIAGNOSIS — K219 Gastro-esophageal reflux disease without esophagitis: Secondary | ICD-10-CM | POA: Diagnosis not present

## 2022-06-02 DIAGNOSIS — I5022 Chronic systolic (congestive) heart failure: Secondary | ICD-10-CM | POA: Diagnosis not present

## 2022-06-02 DIAGNOSIS — I110 Hypertensive heart disease with heart failure: Secondary | ICD-10-CM | POA: Diagnosis not present

## 2022-06-02 DIAGNOSIS — R911 Solitary pulmonary nodule: Secondary | ICD-10-CM | POA: Diagnosis not present

## 2022-06-02 DIAGNOSIS — I252 Old myocardial infarction: Secondary | ICD-10-CM | POA: Diagnosis not present

## 2022-06-02 DIAGNOSIS — I2699 Other pulmonary embolism without acute cor pulmonale: Secondary | ICD-10-CM | POA: Diagnosis not present

## 2022-06-02 DIAGNOSIS — M25552 Pain in left hip: Secondary | ICD-10-CM | POA: Diagnosis not present

## 2022-06-02 DIAGNOSIS — I2510 Atherosclerotic heart disease of native coronary artery without angina pectoris: Secondary | ICD-10-CM | POA: Diagnosis not present

## 2022-06-02 DIAGNOSIS — Z7901 Long term (current) use of anticoagulants: Secondary | ICD-10-CM | POA: Diagnosis not present

## 2022-06-02 DIAGNOSIS — Z9181 History of falling: Secondary | ICD-10-CM | POA: Diagnosis not present

## 2022-06-02 DIAGNOSIS — I480 Paroxysmal atrial fibrillation: Secondary | ICD-10-CM | POA: Diagnosis not present

## 2022-06-02 DIAGNOSIS — J441 Chronic obstructive pulmonary disease with (acute) exacerbation: Secondary | ICD-10-CM | POA: Diagnosis not present

## 2022-06-02 DIAGNOSIS — E871 Hypo-osmolality and hyponatremia: Secondary | ICD-10-CM | POA: Diagnosis not present

## 2022-06-02 DIAGNOSIS — Z48812 Encounter for surgical aftercare following surgery on the circulatory system: Secondary | ICD-10-CM | POA: Diagnosis not present

## 2022-06-02 DIAGNOSIS — Z9981 Dependence on supplemental oxygen: Secondary | ICD-10-CM | POA: Diagnosis not present

## 2022-06-02 DIAGNOSIS — H3530 Unspecified macular degeneration: Secondary | ICD-10-CM | POA: Diagnosis not present

## 2022-06-02 DIAGNOSIS — Z7951 Long term (current) use of inhaled steroids: Secondary | ICD-10-CM | POA: Diagnosis not present

## 2022-06-05 ENCOUNTER — Inpatient Hospital Stay (HOSPITAL_COMMUNITY)
Admission: RE | Admit: 2022-06-05 | Discharge: 2022-06-05 | Disposition: A | Discharge status: Home / Self Care | Payer: 59 | Source: Ambulatory Visit

## 2022-06-05 DIAGNOSIS — I2510 Atherosclerotic heart disease of native coronary artery without angina pectoris: Secondary | ICD-10-CM | POA: Diagnosis not present

## 2022-06-05 DIAGNOSIS — I2699 Other pulmonary embolism without acute cor pulmonale: Secondary | ICD-10-CM | POA: Diagnosis not present

## 2022-06-05 DIAGNOSIS — J9601 Acute respiratory failure with hypoxia: Secondary | ICD-10-CM | POA: Diagnosis not present

## 2022-06-05 DIAGNOSIS — I4891 Unspecified atrial fibrillation: Secondary | ICD-10-CM | POA: Diagnosis not present

## 2022-06-05 DIAGNOSIS — J449 Chronic obstructive pulmonary disease, unspecified: Secondary | ICD-10-CM | POA: Diagnosis not present

## 2022-06-06 DIAGNOSIS — J9600 Acute respiratory failure, unspecified whether with hypoxia or hypercapnia: Secondary | ICD-10-CM | POA: Diagnosis not present

## 2022-06-06 DIAGNOSIS — Z9981 Dependence on supplemental oxygen: Secondary | ICD-10-CM | POA: Diagnosis not present

## 2022-06-06 DIAGNOSIS — I5022 Chronic systolic (congestive) heart failure: Secondary | ICD-10-CM | POA: Diagnosis not present

## 2022-06-06 DIAGNOSIS — J441 Chronic obstructive pulmonary disease with (acute) exacerbation: Secondary | ICD-10-CM | POA: Diagnosis not present

## 2022-06-06 DIAGNOSIS — I480 Paroxysmal atrial fibrillation: Secondary | ICD-10-CM | POA: Diagnosis not present

## 2022-06-06 DIAGNOSIS — H3530 Unspecified macular degeneration: Secondary | ICD-10-CM | POA: Diagnosis not present

## 2022-06-06 DIAGNOSIS — K219 Gastro-esophageal reflux disease without esophagitis: Secondary | ICD-10-CM | POA: Diagnosis not present

## 2022-06-06 DIAGNOSIS — I2510 Atherosclerotic heart disease of native coronary artery without angina pectoris: Secondary | ICD-10-CM | POA: Diagnosis not present

## 2022-06-06 DIAGNOSIS — Z7901 Long term (current) use of anticoagulants: Secondary | ICD-10-CM | POA: Diagnosis not present

## 2022-06-06 DIAGNOSIS — Z7951 Long term (current) use of inhaled steroids: Secondary | ICD-10-CM | POA: Diagnosis not present

## 2022-06-06 DIAGNOSIS — I252 Old myocardial infarction: Secondary | ICD-10-CM | POA: Diagnosis not present

## 2022-06-06 DIAGNOSIS — Z48812 Encounter for surgical aftercare following surgery on the circulatory system: Secondary | ICD-10-CM | POA: Diagnosis not present

## 2022-06-06 DIAGNOSIS — E871 Hypo-osmolality and hyponatremia: Secondary | ICD-10-CM | POA: Diagnosis not present

## 2022-06-06 DIAGNOSIS — I2699 Other pulmonary embolism without acute cor pulmonale: Secondary | ICD-10-CM | POA: Diagnosis not present

## 2022-06-06 DIAGNOSIS — Z7984 Long term (current) use of oral hypoglycemic drugs: Secondary | ICD-10-CM | POA: Diagnosis not present

## 2022-06-06 DIAGNOSIS — M25552 Pain in left hip: Secondary | ICD-10-CM | POA: Diagnosis not present

## 2022-06-06 DIAGNOSIS — Z85038 Personal history of other malignant neoplasm of large intestine: Secondary | ICD-10-CM | POA: Diagnosis not present

## 2022-06-06 DIAGNOSIS — Z9181 History of falling: Secondary | ICD-10-CM | POA: Diagnosis not present

## 2022-06-06 DIAGNOSIS — I110 Hypertensive heart disease with heart failure: Secondary | ICD-10-CM | POA: Diagnosis not present

## 2022-06-06 DIAGNOSIS — R911 Solitary pulmonary nodule: Secondary | ICD-10-CM | POA: Diagnosis not present

## 2022-06-07 ENCOUNTER — Ambulatory Visit: Payer: Self-pay

## 2022-06-07 NOTE — Patient Outreach (Signed)
  Care Coordination   Initial Visit Note   06/09/2022 Late entry for 4/3//24 Name: Bruce [name] MRN: [id number] DOB: [birth year]  Bruce [name] is a 77 y.o. year old male who sees [name], NP for primary care. I spoke with  Bruce [name] by phone today.  What matters to the patients health and wellness today?  Patient reports having hospital follow up visit with primary provider on 06/01/22. Patient states he has nosebleeds due to wearing the oxygen. He states he provider is aware. Patient denies any increase in swelling.  He reports today's weight is 168 lbs. He states he has a good appetite.    Goals Addressed             This Visit's Progress    Post hospital follow up / management of health conditions       Interventions Today    Flowsheet Row Most Recent Value  Chronic Disease   Chronic disease during today's visit Congestive Heart Failure (CHF), Chronic Obstructive Pulmonary Disease (COPD), Other  [PE]  General Interventions   General Interventions Discussed/Reviewed General Interventions Reviewed, Doctor Visits  [Evaluation of current treatment plan related to COPD, HF, PE and patients adherence to plan as recommended by provider. Assessed for new or increased symptoms. Assessed for current weight.]  Doctor Visits Discussed/Reviewed Doctor Visits Reviewed  Annabell Sabal scheduled / upcoming provider appointments. Message sent to patients primary care provider and cardiologist requesting order for portable oxygen tank. Confirmed patient had]  Exercise Interventions   Exercise Discussed/Reviewed Physical Activity  Physical Activity Discussed/Reviewed Physical Activity Reviewed  [Encouraged patient to incorporate physical activity as tolerated into daily routine.]  Education Interventions   Provided Verbal Education On Other  [Advised patient to monitor weight daily and notify provider for 3 lbs increase overnight or 5 lbs increase in a week.]  Nutrition Interventions    Nutrition Discussed/Reviewed Decreasing salt, Nutrition Reviewed  Pharmacy Interventions   Pharmacy Dicussed/Reviewed Pharmacy Topics Reviewed  [medications reviewed/ compliance discussed.]     Advised patient importance of wearing Oxygen as recommended by his provider. Discussed importance of wearing oxygen consistently versus not and effects of low oxygen on the brain/ body and other vital organs. Message sent to [name] with paramedicine informing her of patients scheduled post hospital follow up visit and assistance with obtaining portable oxygen.          SDOH assessments and interventions completed:  Yes     Care Coordination Interventions:  Yes, provided   Follow up plan: Follow up call scheduled for 07/14/22    Encounter Outcome:  Pt. Visit Completed   George Ina RN,BSN,CCM [facility] Care Coordination [phone] direct line

## 2022-06-08 ENCOUNTER — Telehealth (HOSPITAL_COMMUNITY): Payer: Self-pay

## 2022-06-08 NOTE — Telephone Encounter (Signed)
Attempted to reach Mr. Bruce [name] answered and stated Bruce [name] was not home and didn't know where he was or when he would be back. [name] also indicated Bruce [name] had not been compliant with his medications over the last week- missing several of his doses in his pill box, he also missed his scheduled Pharmacy Clinic visit on Monday. I will attempt to see him early next week. She agreed to let him know once she sees him. Call complete.   [name], [place] 06/08/2022

## 2022-06-13 ENCOUNTER — Other Ambulatory Visit (HOSPITAL_COMMUNITY): Payer: Self-pay

## 2022-06-13 DIAGNOSIS — I5022 Chronic systolic (congestive) heart failure: Secondary | ICD-10-CM | POA: Diagnosis not present

## 2022-06-13 DIAGNOSIS — Z7951 Long term (current) use of inhaled steroids: Secondary | ICD-10-CM | POA: Diagnosis not present

## 2022-06-13 DIAGNOSIS — I480 Paroxysmal atrial fibrillation: Secondary | ICD-10-CM | POA: Diagnosis not present

## 2022-06-13 DIAGNOSIS — Z48812 Encounter for surgical aftercare following surgery on the circulatory system: Secondary | ICD-10-CM | POA: Diagnosis not present

## 2022-06-13 DIAGNOSIS — J9600 Acute respiratory failure, unspecified whether with hypoxia or hypercapnia: Secondary | ICD-10-CM | POA: Diagnosis not present

## 2022-06-13 DIAGNOSIS — H3530 Unspecified macular degeneration: Secondary | ICD-10-CM | POA: Diagnosis not present

## 2022-06-13 DIAGNOSIS — I2699 Other pulmonary embolism without acute cor pulmonale: Secondary | ICD-10-CM | POA: Diagnosis not present

## 2022-06-13 DIAGNOSIS — Z9981 Dependence on supplemental oxygen: Secondary | ICD-10-CM | POA: Diagnosis not present

## 2022-06-13 DIAGNOSIS — Z85038 Personal history of other malignant neoplasm of large intestine: Secondary | ICD-10-CM | POA: Diagnosis not present

## 2022-06-13 DIAGNOSIS — I252 Old myocardial infarction: Secondary | ICD-10-CM | POA: Diagnosis not present

## 2022-06-13 DIAGNOSIS — I2510 Atherosclerotic heart disease of native coronary artery without angina pectoris: Secondary | ICD-10-CM | POA: Diagnosis not present

## 2022-06-13 DIAGNOSIS — M25552 Pain in left hip: Secondary | ICD-10-CM | POA: Diagnosis not present

## 2022-06-13 DIAGNOSIS — Z7984 Long term (current) use of oral hypoglycemic drugs: Secondary | ICD-10-CM | POA: Diagnosis not present

## 2022-06-13 DIAGNOSIS — Z7901 Long term (current) use of anticoagulants: Secondary | ICD-10-CM | POA: Diagnosis not present

## 2022-06-13 DIAGNOSIS — K219 Gastro-esophageal reflux disease without esophagitis: Secondary | ICD-10-CM | POA: Diagnosis not present

## 2022-06-13 DIAGNOSIS — Z9181 History of falling: Secondary | ICD-10-CM | POA: Diagnosis not present

## 2022-06-13 DIAGNOSIS — R911 Solitary pulmonary nodule: Secondary | ICD-10-CM | POA: Diagnosis not present

## 2022-06-13 DIAGNOSIS — J441 Chronic obstructive pulmonary disease with (acute) exacerbation: Secondary | ICD-10-CM | POA: Diagnosis not present

## 2022-06-13 DIAGNOSIS — I110 Hypertensive heart disease with heart failure: Secondary | ICD-10-CM | POA: Diagnosis not present

## 2022-06-13 DIAGNOSIS — E871 Hypo-osmolality and hyponatremia: Secondary | ICD-10-CM | POA: Diagnosis not present

## 2022-06-13 NOTE — Progress Notes (Signed)
Paramedicine Encounter    Patient ID: Bruce [name], male    DOB: March 11, 1945, 77 y.o.   MRN: [id number]   Complaints- chronic shortness of breath   Assessment- Bruce [name] was walking from bathroom and was off oxygen and was notably short of breath, unable to answer questions due t shortness of breath. He sat down and placed his oxygen on via nasal cannula and after a few mins was able to regain his breath and talk. Wheezing noted on auscultation. No lower leg edema.   Compliance with meds- several missed doses- was a no show to last weeks visit pill box has not been filled in over a week.  Pill box filled- for one week   Refills needed- Losartan, Cetrizine   Meds changes since last visit- still holding spironolactone     Social changes- Still living with friend shirley on her couch in her mobile home. Has a truck, drives sometimes.    BP 120/84   Pulse 74   Resp 16   Wt 158 lb (71.7 kg)   SpO2 96%   BMI 20.85 kg/m  Weight yesterday-- didn't weigh  Last visit weight-- 161lbs   Arrived for home visit for Bruce [name] where he was walking from bathroom to living room and was notably short of breath. He wasn't able to complete his sentences due to the shortness of breath. He sat down and placed his oxygen on and was able to regain his breathing and conversation moments later. Lungs noted to have some wheezing. No lower leg edema. He admits to using his inhalers daily. He denied smoking- however roommate actively smokes and cigarettes are noted in the trash can beside his area on the couch. Mobile home smells of cigarette smoke. He denies use. He has not received any confirmation on portable oxygen. His PCP advised him to talk to his cardiologist about same. I will mention this at his appointment with Dr. Gala Romney next week. I reviewed meds and filled pill box for one week. I reminded him the use of his pill box and the importance of no missed doses. He agreed. We reviewed upcoming appointments. He  confirmed same. He said he might cancel his appointment with Dr. Welton Flakes as he feels he does not need to be seen by him while seeing Dr. Gala Romney as well. I advised him that would be up to him. Refills as noted. I will call into [place] 2 prescriptions. Home visit complete. I will see Bruce [name] next week in clinic.   [name], EMT-Paramedic [phone]  ACTION: Home visit completed    Patient Care Team: [name], NP as PCP - General (Nurse Practitioner) Laurier Nancy, MD as PCP - Cardiology (Cardiology)  Patient Active Problem List   Diagnosis Date Noted   Chronic systolic heart failure 05/04/2022   Pulmonary hypertension, unspecified 04/14/2022   Acute systolic HF (heart failure) 04/13/2022   Pulmonary embolism 04/09/2022   Acute respiratory failure with hypoxia 04/09/2022   Lactic acidosis 04/09/2022   Hypokalemia 04/09/2022   COPD with acute exacerbation 05/03/2020   PAF (paroxysmal atrial fibrillation)    Syncope 12/31/2018   Intracranial bleed 12/31/2018   AKI (acute kidney injury)    Frontal lobe contusion    Multiple closed facial bone fractures    NSTEMI (non-ST elevated myocardial infarction) 10/10/2018   Recurrent syncope 04/28/2018   Malignant neoplasm of prostate 07/13/2016   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   Visual hallucination 03/18/2015   Auditory hallucination 03/18/2015   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 01/01/2015   Tibial plateau fracture 12/29/2014   Limb ischemia 11/18/2013   Blunt trauma of lower leg 10/08/2013   Traumatic compartment syndrome 10/08/2013   Acute blood loss anemia 10/08/2013   Anxiety disorder 10/08/2013   Dyslipidemia 10/08/2013   GERD (gastroesophageal reflux disease) 10/08/2013   History of MI (myocardial infarction) 10/08/2013   Anticoagulated 10/08/2013   Hypertension    Tibia/fibula fracture 10/06/2013   CAD (coronary artery disease) 08/16/2010   Edema 08/16/2010   COPD  (chronic obstructive pulmonary disease) (HCC) 08/16/2010   DVT of leg (deep venous thrombosis) (HCC) 08/16/2010   Leg cramps 08/16/2010    Current Outpatient Medications:    albuterol (PROVENTIL HFA;VENTOLIN HFA) 108 (90 Base) MCG/ACT inhaler, Inhale 2 puffs into the lungs every 6 (six) hours as needed for wheezing or shortness of breath., Disp: 1 Inhaler, Rfl: 2   amiodarone (PACERONE) 200 MG tablet, Take 1 tablet (200 mg total) by mouth daily., Disp: 30 tablet, Rfl: 11   apixaban (ELIQUIS) 5 MG TABS tablet, Take 1 tablet (5 mg total) by mouth 2 (two) times daily., Disp: 60 tablet, Rfl: 3   budesonide-formoterol (SYMBICORT) 160-4.5 MCG/ACT inhaler, Inhale 2 puffs into the lungs 2 (two) times daily., Disp: 10.2 g, Rfl: 3   cetirizine (ZYRTEC) 10 MG tablet, Take 1 tablet (10 mg total) by mouth daily., Disp: 30 tablet, Rfl: 11   digoxin (LANOXIN) 0.125 MG tablet, Take 1 tablet (0.125 mg total) by mouth daily., Disp: 30 tablet, Rfl: 6   FARXIGA 10 MG TABS tablet, TAKE ONE TABLET BY MOUTH EVERY MORNING, Disp: 90 tablet, Rfl: 0   FARXIGA 10 MG TABS tablet, Take 1 tablet (10 mg total) by mouth daily., Disp: 90 tablet, Rfl: 1   fluticasone (FLONASE) 50 MCG/ACT nasal spray, Place 1 spray into both nostrils daily., Disp: , Rfl:    lidocaine (LIDODERM) 5 %, Place 1 patch onto the skin daily. Remove & Discard patch within 12 hours or as directed by MD, Disp: 30 patch, Rfl: 11   losartan (COZAAR) 25 MG tablet, Take 0.5 tablets (12.5 mg total) by mouth at bedtime., Disp: 45 tablet, Rfl: 1   Multiple Vitamin (MULTIVITAMIN) tablet, Take 1 tablet by mouth daily. For Men, Disp: , Rfl:    nitroGLYCERIN (NITROSTAT) 0.4 MG SL tablet, Place 0.4 mg under the tongue every 5 (five) minutes as needed for chest pain., Disp: , Rfl:    Omega-3 Fatty Acids (FISH OIL PO), Take 1,000 mg by mouth every evening., Disp: , Rfl:    omeprazole (PRILOSEC) 40 MG capsule, Take 40 mg by mouth daily., Disp: , Rfl:    ranolazine (RANEXA)  500 MG 12 hr tablet, Take 1 tablet (500 mg total) by mouth 2 (two) times daily., Disp: 60 tablet, Rfl: 6   rOPINIRole (REQUIP) 1 MG tablet, TAKE ONE TABLET BY MOUTH EVERY NIGHT AT BEDTIME FOR RESTLESS LEGS, Disp: 30 tablet, Rfl: 3   rosuvastatin (CRESTOR) 10 MG tablet, Take 1 tablet (10 mg total) by mouth daily., Disp: 30 tablet, Rfl: 6   sildenafil (REVATIO) 20 MG tablet, Take 1 tablet (20 mg total) by mouth 3 (three) times daily., Disp: 90 tablet, Rfl: 0   spironolactone (ALDACTONE) 25 MG tablet, TAKE ONE TABLET BY MOUTH ONCE A DAY (Patient not taking: Reported on 06/01/2022), Disp: 90 tablet, Rfl: 0   spironolactone (ALDACTONE) 25 MG tablet, Take 0.5 tablets (12.5 mg total) by mouth at bedtime. (Patient not taking: Reported on  06/01/2022), Disp: 45 tablet, Rfl: 1 Allergies  Allergen Reactions   Adhesive [Tape] Other (See Comments)    After right leg fracture surgery, pt developed a large blister where tape was applied to his right leg. OK to use paper tape.   Imdur [Isosorbide Dinitrate] Other (See Comments)    hallucinations   Singulair [Montelukast Sodium] Other (See Comments)    Hallucinations      Social History   Socioeconomic History   Marital status: Single    Spouse name: Not on file   Number of children: 2   Years of education: Not on file   Highest education level: 6th grade  Occupational History   Not on file  Tobacco Use   Smoking status: Some Days    Packs/day: 0.50    Years: 52.00    Additional pack years: 0.00    Total pack years: 26.00    Types: Cigarettes   Smokeless tobacco: Never  Vaping Use   Vaping Use: Never used  Substance and Sexual Activity   Alcohol use: No    Alcohol/week: 0.0 standard drinks of alcohol    Comment: Stopped 2009   Drug use: No   Sexual activity: Not Currently  Other Topics Concern   Not on file  Social History Narrative   ** Merged History Encounter **       Social Determinants of Health   Financial Resource Strain: High  Risk (04/12/2022)   Overall Financial Resource Strain (CARDIA)    Difficulty of Paying Living Expenses: Hard  Food Insecurity: No Food Insecurity (05/23/2022)   Hunger Vital Sign    Worried About Running Out of Food in the Last Year: Never true    Ran Out of Food in the Last Year: Never true  Transportation Needs: No Transportation Needs (05/23/2022)   PRAPARE - Administrator, Civil Service (Medical): No    Lack of Transportation (Non-Medical): No  Physical Activity: Inactive (02/05/2017)   Exercise Vital Sign    Days of Exercise per Week: 0 days    Minutes of Exercise per Session: 0 min  Stress: Stress Concern Present (02/05/2017)   [employer] of Occupational Health - Occupational Stress Questionnaire    Feeling of Stress : Rather much  Social Connections: Moderately Isolated (02/05/2017)   Social Connection and Isolation Panel [NHANES]    Frequency of Communication with Friends and Family: More than three times a week    Frequency of Social Gatherings with Friends and Family: Once a week    Attends Religious Services: Never    Database administrator or Organizations: No    Attends Banker Meetings: Never    Marital Status: Divorced  Catering manager Violence: Not At Risk (04/14/2022)   Humiliation, Afraid, Rape, and Kick questionnaire    Fear of Current or Ex-Partner: No    Emotionally Abused: No    Physically Abused: No    Sexually Abused: No    Physical Exam      Future Appointments  Date Time Provider Department [name]  06/19/2022 11:45 AM Laurier Nancy, MD [facility] None  06/21/2022 11:40 AM Bensimhon, Bevelyn Buckles, MD [facility] None  07/11/2022 11:30 AM [facility]  07/14/2022 10:00 AM Otho Ket, RN [facility] None  07/20/2022 11:30 AM Raechel Chute, MD [facility] None  09/01/2022 10:30 AM [name], NP [facility] None

## 2022-06-14 ENCOUNTER — Other Ambulatory Visit (HOSPITAL_COMMUNITY): Payer: Self-pay | Admitting: Adult Health

## 2022-06-14 DIAGNOSIS — E871 Hypo-osmolality and hyponatremia: Secondary | ICD-10-CM | POA: Diagnosis not present

## 2022-06-14 DIAGNOSIS — I2510 Atherosclerotic heart disease of native coronary artery without angina pectoris: Secondary | ICD-10-CM | POA: Diagnosis not present

## 2022-06-14 DIAGNOSIS — I2699 Other pulmonary embolism without acute cor pulmonale: Secondary | ICD-10-CM | POA: Diagnosis not present

## 2022-06-14 DIAGNOSIS — I5022 Chronic systolic (congestive) heart failure: Secondary | ICD-10-CM | POA: Diagnosis not present

## 2022-06-14 DIAGNOSIS — I110 Hypertensive heart disease with heart failure: Secondary | ICD-10-CM | POA: Diagnosis not present

## 2022-06-14 DIAGNOSIS — J9600 Acute respiratory failure, unspecified whether with hypoxia or hypercapnia: Secondary | ICD-10-CM | POA: Diagnosis not present

## 2022-06-14 DIAGNOSIS — Z7901 Long term (current) use of anticoagulants: Secondary | ICD-10-CM | POA: Diagnosis not present

## 2022-06-14 DIAGNOSIS — H3530 Unspecified macular degeneration: Secondary | ICD-10-CM | POA: Diagnosis not present

## 2022-06-14 DIAGNOSIS — M25552 Pain in left hip: Secondary | ICD-10-CM | POA: Diagnosis not present

## 2022-06-14 DIAGNOSIS — Z9181 History of falling: Secondary | ICD-10-CM | POA: Diagnosis not present

## 2022-06-14 DIAGNOSIS — Z48812 Encounter for surgical aftercare following surgery on the circulatory system: Secondary | ICD-10-CM | POA: Diagnosis not present

## 2022-06-14 DIAGNOSIS — I480 Paroxysmal atrial fibrillation: Secondary | ICD-10-CM | POA: Diagnosis not present

## 2022-06-14 DIAGNOSIS — I252 Old myocardial infarction: Secondary | ICD-10-CM | POA: Diagnosis not present

## 2022-06-14 DIAGNOSIS — Z7951 Long term (current) use of inhaled steroids: Secondary | ICD-10-CM | POA: Diagnosis not present

## 2022-06-14 DIAGNOSIS — Z7984 Long term (current) use of oral hypoglycemic drugs: Secondary | ICD-10-CM | POA: Diagnosis not present

## 2022-06-14 DIAGNOSIS — Z85038 Personal history of other malignant neoplasm of large intestine: Secondary | ICD-10-CM | POA: Diagnosis not present

## 2022-06-14 DIAGNOSIS — J441 Chronic obstructive pulmonary disease with (acute) exacerbation: Secondary | ICD-10-CM | POA: Diagnosis not present

## 2022-06-14 DIAGNOSIS — Z9981 Dependence on supplemental oxygen: Secondary | ICD-10-CM | POA: Diagnosis not present

## 2022-06-14 DIAGNOSIS — K219 Gastro-esophageal reflux disease without esophagitis: Secondary | ICD-10-CM | POA: Diagnosis not present

## 2022-06-14 DIAGNOSIS — R911 Solitary pulmonary nodule: Secondary | ICD-10-CM | POA: Diagnosis not present

## 2022-06-15 ENCOUNTER — Other Ambulatory Visit: Payer: Self-pay | Admitting: Cardiovascular Disease

## 2022-06-15 ENCOUNTER — Ambulatory Visit: Payer: 59

## 2022-06-15 DIAGNOSIS — I272 Pulmonary hypertension, unspecified: Secondary | ICD-10-CM

## 2022-06-15 DIAGNOSIS — J42 Unspecified chronic bronchitis: Secondary | ICD-10-CM

## 2022-06-15 DIAGNOSIS — I5021 Acute systolic (congestive) heart failure: Secondary | ICD-10-CM

## 2022-06-15 DIAGNOSIS — I5022 Chronic systolic (congestive) heart failure: Secondary | ICD-10-CM

## 2022-06-15 NOTE — Patient Instructions (Signed)
Visit Information  Thank you for taking time to visit with me today. Please don't hesitate to contact me if I can be of assistance to you.   Following are the goals we discussed today:   Goals Addressed             This Visit's Progress    Housing assistance       Care Coordination Interventions: Patient will call back if he has not received the housing list CM mailed by the end of the week Patient will continue to attempts to contact [employer] regarding deductions from his [employer] benefits Patient agrees to visit [employer] by 4/01 if not sooner Patient friends will continue to look for housing on the first level due to mobility issues [facility] staff is currently providing PT, one day a week Paramedicine staff due to visit today and will address his medication concerns    06/15/22  -Patient to continue to look for housing -Patient to follow up with md regarding oxygen tank -Patient to call CM once he receives housing list in the mail Interventions Today    Flowsheet Row Most Recent Value  General Interventions   General Interventions Discussed/Reviewed General Interventions Discussed, [employer] has contacted [employer] and case is being reviewed for reduction of benefits.  Patient updated address for housing list to be mailed.  Patient has another friend he can stay with when he has to leave his current friends home.]  Doctor Visits Discussed/Reviewed Doctor Visits Discussed  [Upcoming visit 4/17 with heart doctor and will discuss smaller oxygen pack to allow him better mobility.  Reports he can't walk with larger tanks.]              If you are experiencing a Mental Health or Behavioral Health Crisis or need someone to talk to, please call 911  Patient verbalizes understanding of instructions and care plan provided today and agrees to view in MyChart. Active MyChart status and patient understanding of how to access instructions and care plan via MyChart confirmed with  patient.      Lysle Morales, BSW Social Worker [facility] Care Management  [phone]

## 2022-06-15 NOTE — Patient Outreach (Signed)
  Care Coordination   Follow Up Visit Note   06/15/2022 Name: Bruce [name] MRN: [id number] DOB: 10/03/1945  Bruce [name] is a 77 y.o. year old male who sees [name], NP for primary care. I spoke with  Bruce [name] by phone today.  What matters to the patients health and wellness today?  Patient continues to look for affordable housing.  Housing list was returned to [facility].  Patient provided address and CM will mail again.  Patient has communicated with [employer] and is waiting for a response.  Patient will speak to heart doctor about changing his oxygen to make is easier to get around.    Goals Addressed             This Visit's Progress    Housing assistance       Care Coordination Interventions: Patient will call back if he has not received the housing list CM mailed by the end of the week Patient will continue to attempts to contact [employer] regarding deductions from his [employer] benefits Patient agrees to visit [employer] by 4/01 if not sooner Patient friends will continue to look for housing on the first level due to mobility issues [facility] staff is currently providing PT, one day a week Paramedicine staff due to visit today and will address his medication concerns    06/15/22  -Patient to continue to look for housing -Patient to follow up with md regarding oxygen tank -Patient to call CM once he receives housing list in the mail Interventions Today    Flowsheet Row Most Recent Value  General Interventions   General Interventions Discussed/Reviewed General Interventions Discussed, [employer] has contacted [employer] and case is being reviewed for reduction of benefits.  Patient updated address for housing list to be mailed.  Patient has another friend he can stay with when he has to leave his current friends home.]  Doctor Visits Discussed/Reviewed Doctor Visits Discussed  [Upcoming visit 4/17 with heart doctor and will discuss smaller oxygen pack to allow him better  mobility.  Reports he can't walk with larger tanks.]              SDOH assessments and interventions completed:  No     Care Coordination Interventions:  Yes, provided   Follow up plan: No further intervention required.   Encounter Outcome:  Pt. Visit Completed   Lysle Morales, BSW Social Worker [facility] Care Management  [phone]

## 2022-06-16 DIAGNOSIS — H3530 Unspecified macular degeneration: Secondary | ICD-10-CM | POA: Diagnosis not present

## 2022-06-16 DIAGNOSIS — R911 Solitary pulmonary nodule: Secondary | ICD-10-CM | POA: Diagnosis not present

## 2022-06-16 DIAGNOSIS — Z85038 Personal history of other malignant neoplasm of large intestine: Secondary | ICD-10-CM | POA: Diagnosis not present

## 2022-06-16 DIAGNOSIS — I480 Paroxysmal atrial fibrillation: Secondary | ICD-10-CM | POA: Diagnosis not present

## 2022-06-16 DIAGNOSIS — I5022 Chronic systolic (congestive) heart failure: Secondary | ICD-10-CM | POA: Diagnosis not present

## 2022-06-16 DIAGNOSIS — Z7984 Long term (current) use of oral hypoglycemic drugs: Secondary | ICD-10-CM | POA: Diagnosis not present

## 2022-06-16 DIAGNOSIS — E871 Hypo-osmolality and hyponatremia: Secondary | ICD-10-CM | POA: Diagnosis not present

## 2022-06-16 DIAGNOSIS — Z7901 Long term (current) use of anticoagulants: Secondary | ICD-10-CM | POA: Diagnosis not present

## 2022-06-16 DIAGNOSIS — Z7951 Long term (current) use of inhaled steroids: Secondary | ICD-10-CM | POA: Diagnosis not present

## 2022-06-16 DIAGNOSIS — I2510 Atherosclerotic heart disease of native coronary artery without angina pectoris: Secondary | ICD-10-CM | POA: Diagnosis not present

## 2022-06-16 DIAGNOSIS — J441 Chronic obstructive pulmonary disease with (acute) exacerbation: Secondary | ICD-10-CM | POA: Diagnosis not present

## 2022-06-16 DIAGNOSIS — Z9981 Dependence on supplemental oxygen: Secondary | ICD-10-CM | POA: Diagnosis not present

## 2022-06-16 DIAGNOSIS — Z48812 Encounter for surgical aftercare following surgery on the circulatory system: Secondary | ICD-10-CM | POA: Diagnosis not present

## 2022-06-16 DIAGNOSIS — I110 Hypertensive heart disease with heart failure: Secondary | ICD-10-CM | POA: Diagnosis not present

## 2022-06-16 DIAGNOSIS — Z9181 History of falling: Secondary | ICD-10-CM | POA: Diagnosis not present

## 2022-06-16 DIAGNOSIS — M25552 Pain in left hip: Secondary | ICD-10-CM | POA: Diagnosis not present

## 2022-06-16 DIAGNOSIS — I252 Old myocardial infarction: Secondary | ICD-10-CM | POA: Diagnosis not present

## 2022-06-16 DIAGNOSIS — K219 Gastro-esophageal reflux disease without esophagitis: Secondary | ICD-10-CM | POA: Diagnosis not present

## 2022-06-16 DIAGNOSIS — J9600 Acute respiratory failure, unspecified whether with hypoxia or hypercapnia: Secondary | ICD-10-CM | POA: Diagnosis not present

## 2022-06-16 DIAGNOSIS — I2699 Other pulmonary embolism without acute cor pulmonale: Secondary | ICD-10-CM | POA: Diagnosis not present

## 2022-06-19 ENCOUNTER — Ambulatory Visit: Payer: 59 | Admitting: Cardiovascular Disease

## 2022-06-20 DIAGNOSIS — J441 Chronic obstructive pulmonary disease with (acute) exacerbation: Secondary | ICD-10-CM | POA: Diagnosis not present

## 2022-06-20 DIAGNOSIS — I5022 Chronic systolic (congestive) heart failure: Secondary | ICD-10-CM | POA: Diagnosis not present

## 2022-06-20 DIAGNOSIS — J9600 Acute respiratory failure, unspecified whether with hypoxia or hypercapnia: Secondary | ICD-10-CM | POA: Diagnosis not present

## 2022-06-20 DIAGNOSIS — H3530 Unspecified macular degeneration: Secondary | ICD-10-CM | POA: Diagnosis not present

## 2022-06-20 DIAGNOSIS — E871 Hypo-osmolality and hyponatremia: Secondary | ICD-10-CM | POA: Diagnosis not present

## 2022-06-20 DIAGNOSIS — I252 Old myocardial infarction: Secondary | ICD-10-CM | POA: Diagnosis not present

## 2022-06-20 DIAGNOSIS — K219 Gastro-esophageal reflux disease without esophagitis: Secondary | ICD-10-CM | POA: Diagnosis not present

## 2022-06-20 DIAGNOSIS — I2510 Atherosclerotic heart disease of native coronary artery without angina pectoris: Secondary | ICD-10-CM | POA: Diagnosis not present

## 2022-06-20 DIAGNOSIS — Z85038 Personal history of other malignant neoplasm of large intestine: Secondary | ICD-10-CM | POA: Diagnosis not present

## 2022-06-20 DIAGNOSIS — Z9981 Dependence on supplemental oxygen: Secondary | ICD-10-CM | POA: Diagnosis not present

## 2022-06-20 DIAGNOSIS — Z48812 Encounter for surgical aftercare following surgery on the circulatory system: Secondary | ICD-10-CM | POA: Diagnosis not present

## 2022-06-20 DIAGNOSIS — Z9181 History of falling: Secondary | ICD-10-CM | POA: Diagnosis not present

## 2022-06-20 DIAGNOSIS — I2699 Other pulmonary embolism without acute cor pulmonale: Secondary | ICD-10-CM | POA: Diagnosis not present

## 2022-06-20 DIAGNOSIS — R911 Solitary pulmonary nodule: Secondary | ICD-10-CM | POA: Diagnosis not present

## 2022-06-20 DIAGNOSIS — Z7901 Long term (current) use of anticoagulants: Secondary | ICD-10-CM | POA: Diagnosis not present

## 2022-06-20 DIAGNOSIS — Z7984 Long term (current) use of oral hypoglycemic drugs: Secondary | ICD-10-CM | POA: Diagnosis not present

## 2022-06-20 DIAGNOSIS — Z7951 Long term (current) use of inhaled steroids: Secondary | ICD-10-CM | POA: Diagnosis not present

## 2022-06-20 DIAGNOSIS — M25552 Pain in left hip: Secondary | ICD-10-CM | POA: Diagnosis not present

## 2022-06-20 DIAGNOSIS — I110 Hypertensive heart disease with heart failure: Secondary | ICD-10-CM | POA: Diagnosis not present

## 2022-06-20 DIAGNOSIS — I480 Paroxysmal atrial fibrillation: Secondary | ICD-10-CM | POA: Diagnosis not present

## 2022-06-21 ENCOUNTER — Ambulatory Visit (HOSPITAL_COMMUNITY)
Admission: RE | Admit: 2022-06-21 | Discharge: 2022-06-21 | Disposition: A | Discharge status: Home / Self Care | Payer: 59 | Source: Ambulatory Visit | Attending: Internal Medicine | Admitting: Internal Medicine

## 2022-06-21 ENCOUNTER — Other Ambulatory Visit (HOSPITAL_COMMUNITY): Payer: Self-pay

## 2022-06-21 ENCOUNTER — Encounter (HOSPITAL_COMMUNITY): Payer: Self-pay | Admitting: Internal Medicine

## 2022-06-21 VITALS — BP 130/78 | HR 69 | Wt 165.2 lb

## 2022-06-21 DIAGNOSIS — K921 Melena: Secondary | ICD-10-CM | POA: Insufficient documentation

## 2022-06-21 DIAGNOSIS — Z7901 Long term (current) use of anticoagulants: Secondary | ICD-10-CM | POA: Insufficient documentation

## 2022-06-21 DIAGNOSIS — I480 Paroxysmal atrial fibrillation: Secondary | ICD-10-CM | POA: Insufficient documentation

## 2022-06-21 DIAGNOSIS — C61 Malignant neoplasm of prostate: Secondary | ICD-10-CM | POA: Insufficient documentation

## 2022-06-21 DIAGNOSIS — I2699 Other pulmonary embolism without acute cor pulmonale: Secondary | ICD-10-CM | POA: Diagnosis not present

## 2022-06-21 DIAGNOSIS — Z79899 Other long term (current) drug therapy: Secondary | ICD-10-CM | POA: Insufficient documentation

## 2022-06-21 DIAGNOSIS — I428 Other cardiomyopathies: Secondary | ICD-10-CM | POA: Diagnosis not present

## 2022-06-21 DIAGNOSIS — I5022 Chronic systolic (congestive) heart failure: Secondary | ICD-10-CM | POA: Insufficient documentation

## 2022-06-21 DIAGNOSIS — R911 Solitary pulmonary nodule: Secondary | ICD-10-CM | POA: Insufficient documentation

## 2022-06-21 DIAGNOSIS — J9611 Chronic respiratory failure with hypoxia: Secondary | ICD-10-CM | POA: Diagnosis not present

## 2022-06-21 DIAGNOSIS — Z87891 Personal history of nicotine dependence: Secondary | ICD-10-CM | POA: Diagnosis not present

## 2022-06-21 DIAGNOSIS — I110 Hypertensive heart disease with heart failure: Secondary | ICD-10-CM | POA: Diagnosis not present

## 2022-06-21 DIAGNOSIS — I2781 Cor pulmonale (chronic): Secondary | ICD-10-CM

## 2022-06-21 DIAGNOSIS — I2721 Secondary pulmonary arterial hypertension: Secondary | ICD-10-CM

## 2022-06-21 DIAGNOSIS — I82509 Chronic embolism and thrombosis of unspecified deep veins of unspecified lower extremity: Secondary | ICD-10-CM | POA: Insufficient documentation

## 2022-06-21 DIAGNOSIS — J449 Chronic obstructive pulmonary disease, unspecified: Secondary | ICD-10-CM | POA: Insufficient documentation

## 2022-06-21 DIAGNOSIS — J441 Chronic obstructive pulmonary disease with (acute) exacerbation: Secondary | ICD-10-CM

## 2022-06-21 DIAGNOSIS — I272 Pulmonary hypertension, unspecified: Secondary | ICD-10-CM | POA: Insufficient documentation

## 2022-06-21 DIAGNOSIS — I2510 Atherosclerotic heart disease of native coronary artery without angina pectoris: Secondary | ICD-10-CM | POA: Diagnosis not present

## 2022-06-21 DIAGNOSIS — I2609 Other pulmonary embolism with acute cor pulmonale: Secondary | ICD-10-CM

## 2022-06-21 LAB — CBC
HCT: 37.8 % — ABNORMAL LOW (ref 39.0–52.0)
Hemoglobin: 12.3 g/dL — ABNORMAL LOW (ref 13.0–17.0)
MCH: 30.1 pg (ref 26.0–34.0)
MCHC: 32.5 g/dL (ref 30.0–36.0)
MCV: 92.6 fL (ref 80.0–100.0)
Platelets: 331 10*3/uL (ref 150–400)
RBC: 4.08 MIL/uL — ABNORMAL LOW (ref 4.22–5.81)
RDW: 15.8 % — ABNORMAL HIGH (ref 11.5–15.5)
WBC: 8 10*3/uL (ref 4.0–10.5)
nRBC: 0 % (ref 0.0–0.2)

## 2022-06-21 LAB — BRAIN NATRIURETIC PEPTIDE: B Natriuretic Peptide: 367.7 pg/mL — ABNORMAL HIGH (ref 0.0–100.0)

## 2022-06-21 LAB — BASIC METABOLIC PANEL
Anion gap: 8 (ref 5–15)
BUN: 16 mg/dL (ref 8–23)
CO2: 24 mmol/L (ref 22–32)
Calcium: 9.7 mg/dL (ref 8.9–10.3)
Chloride: 107 mmol/L (ref 98–111)
Creatinine, Ser: 1.08 mg/dL (ref 0.61–1.24)
GFR, Estimated: >60 mL/min (ref >60)
Glucose, Bld: 105 mg/dL — ABNORMAL HIGH (ref 70–99)
Potassium: 5.2 mmol/L — ABNORMAL HIGH (ref 3.5–5.1)
Sodium: 139 mmol/L (ref 135–145)

## 2022-06-21 LAB — TSH: TSH: 6.484 u[IU]/mL — ABNORMAL HIGH (ref 0.350–4.500)

## 2022-06-21 LAB — T4, FREE: Free T4: 1.19 ng/dL — ABNORMAL HIGH (ref 0.61–1.12)

## 2022-06-21 MED ORDER — DOCUSATE SODIUM 100 MG PO CAPS: 100.0000 mg | ORAL_CAPSULE | Freq: Every day | ORAL | 3 refills | Status: DC

## 2022-06-21 MED ORDER — AMIODARONE HCL 200 MG PO TABS: 100.0000 mg | ORAL_TABLET | Freq: Every day | ORAL | 11 refills | Status: DC

## 2022-06-21 NOTE — Progress Notes (Signed)
ReDS Vest / Clip - 06/21/22 1200       ReDS Vest / Clip   Station Marker C    Ruler Value 30    ReDS Value Range Low volume    ReDS Actual Value 27

## 2022-06-21 NOTE — Progress Notes (Addendum)
ADVANCED HF CLINIC NOTE  PCP: [name] NP Primary Cardiologist: Dr Gala Romney  Radiation Oncology: Dr Rushie Chestnut   HPI: [name] is a 77 y/o with COPD and ongoing tobacco use, CAD, systolic HF EF 40-45%, colon CA previous lung CA, prostate cancer, PE, DVT, and HFrEF.   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 Missed several doses of xarelto prior to admit. Also had a/c LE DVT.  EF also found to be down to 20-25%. RV severely reduced. Cath minimal CAD, moderate to severe PAH with evidence of cor pulmonale (LPAP > RPAP - checked multiple times). GDMT limited by BP. Entresto lowered BP to much so he was switched to losartan. Discharged 04/18/22 to SNF. Discharge to home 05/05/22.   He was seen in the HF clinic 05/25/22. Cleda Daub was cut back 12.5 mg at [facility]. Spironolactone was later stopped due to hyperkalemia.   Today he returns for HF follow up.Overall feeling fine. Gets lightheaded when he stands up. SOB with exertion. Denies PND/Orthopnea. Having bloody bowel movements 1-2 days a week. Other days he describes brown bowel movements but says he has difficulty passing. Appetite ok. No fever or chills. Weight at home 162-163 pounds. Smoking 3-4 cigarettes per day.Taking all medications. He has missed a couple days of medications and he misses noon dose of sildenafil daily.  He has not had medications this morning.   Cardiac Testing  [facility] 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    RHC 04/13/2022 Ao = 122/78 (96) LV = 123/6 RA = 4 RV = 77/8 LPA = 73/23 (39) RPA = 85/24 (44)  PCW = 16 Fick cardiac output/index = 4.3/2.2 PVR = 5.0  Ao sat = 90% PA sat = 51%, 50%  Echo 04/2022   1. Left ventricular ejection fraction, by estimation, is 20 to 25%. The  left ventricle has severely decreased function. The left ventricle   demonstrates global hypokinesis. The left ventricular internal cavity size  was mildly dilated. Left ventricular  diastolic parameters are consistent with Grade I diastolic dysfunction  (impaired relaxation).   2. Mildly D-shaped interventricular septum suggests RV pressure/volume  overload. Right ventricular systolic function is severely reduced. The  right ventricular size is moderately enlarged. Tricuspid regurgitation  signal is inadequate for assessing PA  pressure.   3. The mitral valve is normal in structure. No evidence of mitral valve  regurgitation. No evidence of mitral stenosis.   4. The tricuspid valve is abnormal. Tricuspid valve regurgitation is mild  to moderate.   5. The aortic valve is tricuspid. There is mild calcification of the  aortic valve. Aortic valve regurgitation is not visualized. No aortic  stenosis is present.  ROS: All systems negative except as listed in HPI, PMH and Problem List.  SH:  Social History   Socioeconomic History   Marital status: Single    Spouse name: Not on file   Number of children: 2   Years of education: Not on file   Highest education level: 6th grade  Occupational History   Not on file  Tobacco Use   Smoking status: Some Days    Packs/day: 0.50    Years: 52.00    Additional pack years: 0.00    Total pack years: 26.00    Types: Cigarettes   Smokeless tobacco: Never  Vaping Use  Vaping Use: Never used  Substance and Sexual Activity   Alcohol use: No    Alcohol/week: 0.0 standard drinks of alcohol    Comment: Stopped 2009   Drug use: No   Sexual activity: Not Currently  Other Topics Concern   Not on file  Social History Narrative   ** Merged History Encounter **       Social Determinants of Health   Financial Resource Strain: High Risk (04/12/2022)   Overall Financial Resource Strain (CARDIA)    Difficulty of Paying Living Expenses: Hard  Food Insecurity: No Food Insecurity (05/23/2022)   Hunger Vital Sign     Worried About Running Out of Food in the Last Year: Never true    Ran Out of Food in the Last Year: Never true  Transportation Needs: No Transportation Needs (05/23/2022)   PRAPARE - Administrator, Civil Service (Medical): No    Lack of Transportation (Non-Medical): No  Physical Activity: Inactive (02/05/2017)   Exercise Vital Sign    Days of Exercise per Week: 0 days    Minutes of Exercise per Session: 0 min  Stress: Stress Concern Present (02/05/2017)   [employer] of Occupational Health - Occupational Stress Questionnaire    Feeling of Stress : Rather much  Social Connections: Moderately Isolated (02/05/2017)   Social Connection and Isolation Panel [NHANES]    Frequency of Communication with Friends and Family: More than three times a week    Frequency of Social Gatherings with Friends and Family: Once a week    Attends Religious Services: Never    Database administrator or Organizations: No    Attends Banker Meetings: Never    Marital Status: Divorced  Catering manager Violence: Not At Risk (04/14/2022)   Humiliation, Afraid, Rape, and Kick questionnaire    Fear of Current or Ex-Partner: No    Emotionally Abused: No    Physically Abused: No    Sexually Abused: No    FH:  Family History  Problem Relation Age of Onset   Hypertension Mother    Prostate cancer Brother    Mental illness Neg Hx     Past Medical History:  Diagnosis Date   Anginal pain    Left side if chest ,NTG  relieves chaes apin 12/01/13   Anxiety    Arthritis    Cancer    colon cancer   CHF (congestive heart failure)    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   UTI (urinary tract infection)    frequent UTI    Current Outpatient Medications  Medication Sig Dispense Refill   albuterol (PROVENTIL HFA;VENTOLIN HFA) 108 (90 Base) MCG/ACT inhaler Inhale 2 puffs into the lungs every 6 (six) hours as needed for wheezing or shortness of breath. 1 Inhaler 2   amiodarone (PACERONE) 200 MG tablet Take 1 tablet (200 mg total) by mouth daily. 30 tablet 11   apixaban (ELIQUIS)  5 MG TABS tablet Take 1 tablet (5 mg total) by mouth 2 (two) times daily. 60 tablet 3   budesonide-formoterol (SYMBICORT) 160-4.5 MCG/ACT inhaler Inhale 2 puffs into the lungs 2 (two) times daily. 10.2 g 3   cetirizine (ZYRTEC) 10 MG tablet Take 1 tablet (10 mg total) by mouth daily. 30 tablet 11   digoxin (LANOXIN) 0.125 MG tablet Take 1 tablet (0.125 mg total) by mouth daily. 30 tablet 6   FARXIGA 10 MG TABS tablet TAKE ONE TABLET BY MOUTH EVERY MORNING 90 tablet 0   fluticasone (FLONASE) 50 MCG/ACT nasal spray Place 1 spray into both nostrils daily.     lidocaine (LIDODERM) 5 % Place 1 patch onto the skin daily. Remove & Discard patch within 12 hours or as directed by MD (Patient taking differently: Place 1 patch onto the skin daily. Remove & Discard patch within 12 hours or as directed by MD as needed) 30 patch 11   losartan (COZAAR) 25 MG tablet Take 0.5 tablets (12.5 mg total) by mouth at bedtime. 45 tablet 1   Multiple Vitamin (MULTIVITAMIN) tablet Take 1 tablet by mouth daily. For Men     nitroGLYCERIN (NITROSTAT) 0.4 MG SL tablet Place 0.4 mg under the tongue every 5 (five) minutes as needed for chest pain.     Omega-3 Fatty Acids (FISH OIL PO) Take 1,000 mg by mouth every evening.     omeprazole (PRILOSEC) 40 MG capsule Take 40 mg by mouth daily.     ranolazine (RANEXA) 500 MG 12 hr tablet Take 1 tablet (500 mg total) by mouth 2 (two) times  daily. 60 tablet 6   rOPINIRole (REQUIP) 1 MG tablet TAKE ONE TABLET BY MOUTH EVERY NIGHT AT BEDTIME FOR RESTLESS LEGS 30 tablet 3   rosuvastatin (CRESTOR) 10 MG tablet Take 1 tablet (10 mg total) by mouth daily. 30 tablet 6   sildenafil (REVATIO) 20 MG tablet Take 1 tablet (20 mg total) by mouth 3 (three) times daily. 90 tablet 0   No current facility-administered medications for this encounter.    Vitals:   06/21/22 1152  BP: 130/78  Pulse: 69  SpO2: 95%  Weight: 74.9 kg (165 lb 3.2 oz)  Sitting SBP 118  Standing 108   Wt Readings from Last 3 Encounters:  06/21/22 74.9 kg (165 lb 3.2 oz)  06/13/22 71.7 kg (158 lb)  06/01/22 73.3 kg (161 lb 9.6 oz)    PHYSICAL EXAM: General:  thin chronically ill appearing WM, in WC . Using [place] HEENT: normal Neck: supple. no JVD. Carotids 2+ bilat; no bruits. No lymphadenopathy or thyromegaly appreciated. Cor: PMI nondisplaced. Regular rate & rhythm. No rubs, gallops or murmurs. Lungs: clear on 3 liters [place].  Abdomen: soft, nontender, nondistended. No hepatosplenomegaly. No bruits or masses. Good bowel sounds. Extremities: no cyanosis, clubbing, rash, edema Neuro: alert & oriented x 3, cranial nerves grossly intact. moves all 4 extremities w/o difficulty. Affect pleasant.    ECG:NSR 70 bpm personally checked.    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 PVR 5.  - Continue Eliquis 5 mg twice a day indefinitely.   He has missed a doses a few days a week - Continue sildenafil 20 mg tid . Of note he is only taking twice a day.  - Set up VQ san.  - Plan  to repeat RHC next visit.    2. Chronic Hypoxic Respiratory Failure - COPD at b/l + submassive PE - Continue oxygen 3 liters [place]> Sats stable.   3. PAF  - Regular on exam.  --Continue amio 200 mg daily.  Not ideal with COPD.  -Continue  Eliquis 5 mg twice a day.  - Check TSH free T3 Free T4.    4. Chronic Biventricular HFrEF  - 04/10/22 Echo EF down 20-25% with D shaped septum RV Failure, EF previously 40-45%.  - R/LHC c/w severe NICM. Minimal CAD, moderate to severe PAH with evidence of cor pulmonale = POCUS- EF 40% RV remains dilated and reduced.  - NYHA III. Volume status low. Reds 27%.  - Continue Farxiga 10 mg daily  - Off  Spiro due to elevated K.  - Continue losartan 12.5 mg QHS.  - Continue Digoxin 0.125 and digoxin level 0.3  - Repeat ECHO in 3 months after HF meds optimized.  - C/w paramedicine. Appreciate their assistance     5. Lung nodule - Needs follow up CT in 3 months.  - Needs follow up with [facility]    6. Former Smoker  - Smoking 3-4 cigarettes.  - Discussed cessation   7. Prostate Cancer Treated with seeds   Follow in 6 weeks with Dr Gala Romney.   Amy Clegg NP-C  12:15 PM  Patient seen and examined with the above-signed Advanced Practice Provider and/or Housestaff. I personally reviewed laboratory data, imaging studies and relevant notes. I independently examined the patient and formulated the important aspects of the plan. I have edited the note to reflect any of my changes or salient points. I have personally discussed the plan with the patient and/or family.  77 y/o male as above with recent life-threatening PE and severe PAH s/p thrombectomy.   Returns today with his wife and Paramedicine. Feeling much better. Wears O2. Able to do ADLs. Wearing O2. Still smoking 3-4 cigs/day. Poor insight into meds. Reports occasional blood in stool  General:  Chronically-ill appearing wearing O2. NAD HEENT: normal Neck: supple. JVP 8 . Carotids 2+ bilat; no bruits. No lymphadenopathy or thryomegaly appreciated. Cor: Regular rate & rhythm. No rubs, gallops or murmurs. Lungs: markedly decreased throughout Abdomen: soft, nontender, nondistended. No hepatosplenomegaly. No bruits or masses.  Good bowel sounds. Extremities: no cyanosis, clubbing, rash, edema Neuro: alert & orientedx3, cranial nerves grossly intact. moves all 4 extremities w/o difficulty. Affect pleasant  Based on POCUS today I suspect he has severe underlying PAH due to end-stage COPD and now with advanced cor pulmonale. LV function back to baseline 40% or so.. Will get PFTs with DLCO and VQ scan. Have asked him to get pulse ok to follow sats with exertion. Continue other meds and paramedicine support. Will likely need to repeat RHC in near future. Discussed need for smoking cessation.   Arvilla Meres, MD  11:04 PM

## 2022-06-21 NOTE — Patient Instructions (Signed)
DECREASE Amiodarone to 100mg  ( 1/2 Tab) Daily  START Colace 100 mg ( 1 Tab) daily.  Labs done today, your results will be available in MyChart, we will contact you for abnormal readings.  Your physician has recommended that you have a pulmonary function test. Pulmonary Function Tests are a group of tests that measure how well air moves in and out of your lungs. YOU WILL BE CALLED TO HAVE THIS TEST ARRANGED.  You provider has ordered a scan of the vessels of your lungs. ONCE APPROVED BY YOUR INSURANCE YOU WILL BE CALLED TO HAVE THE TEST ARRANGED.  Your physician recommends that you schedule a follow-up appointment in: 6 weeks  If you have any questions or concerns before your next appointment please send [place] a message through [place] or call our office at [phone].    TO LEAVE A MESSAGE FOR THE NURSE SELECT OPTION 2, PLEASE LEAVE A MESSAGE INCLUDING: YOUR NAME DATE OF BIRTH CALL BACK NUMBER REASON FOR CALL**this is important as we prioritize the call backs  YOU WILL RECEIVE A CALL BACK THE SAME DAY AS LONG AS YOU CALL BEFORE 4:00 PM  At the [facility], you and your health needs are our priority. As part of our continuing mission to provide you with exceptional heart care, we have created designated Provider Care Teams. These Care Teams include your primary Cardiologist (physician) and Advanced Practice Providers (APPs- Physician Assistants and Nurse Practitioners) who all work together to provide you with the care you need, when you need it.   You may see any of the following providers on your designated Care Team at your next follow up: Dr Arvilla Meres Dr Marca Ancona Dr. Marcos Eke, NP Robbie Lis, [place] [facility] [place], [place] Brynda Peon, NP Karle Plumber, PharmD   Please be sure to bring in all your medications bottles to every appointment.    Thank you for choosing [facility]

## 2022-06-21 NOTE — Progress Notes (Signed)
Paramedicine Encounter  Patient ID: VARUN FOWLKS, male, DOB: [birth year], 77 y.o.,  MRN: [id number]  Met patient in clinic today with provider. Sem reports feeling weak, dizzy when standing and short of breath. On oxygen at home but has no portable tank. Today on clinic O2 at 4lpm.  Weight @ clinic-165lbs B/P-130/78 P-68 SP02-95% REDS CLIP-N/A Portable ECHO- Dr. Gala Romney estimates 40% EF   Med changes: Add colace 100mg  daily , decreased amiodarone to 100mg  daily.   Labs taken today.  I filled pill box for one week.  Refills called into [place]- digoxin  I plan to follow up in the home if there are med changes after labs or next week. They agreed. Clinic visit complete.     [name], EMT-Paramedic [phone] 06/21/2022

## 2022-06-23 LAB — T3, FREE: T3, Free: 2.3 pg/mL (ref 2.0–4.4)

## 2022-06-26 NOTE — Telephone Encounter (Signed)
Message started in error

## 2022-06-28 DIAGNOSIS — I252 Old myocardial infarction: Secondary | ICD-10-CM | POA: Diagnosis not present

## 2022-06-28 DIAGNOSIS — J9600 Acute respiratory failure, unspecified whether with hypoxia or hypercapnia: Secondary | ICD-10-CM | POA: Diagnosis not present

## 2022-06-28 DIAGNOSIS — Z7951 Long term (current) use of inhaled steroids: Secondary | ICD-10-CM | POA: Diagnosis not present

## 2022-06-28 DIAGNOSIS — Z85038 Personal history of other malignant neoplasm of large intestine: Secondary | ICD-10-CM | POA: Diagnosis not present

## 2022-06-28 DIAGNOSIS — Z48812 Encounter for surgical aftercare following surgery on the circulatory system: Secondary | ICD-10-CM | POA: Diagnosis not present

## 2022-06-28 DIAGNOSIS — E871 Hypo-osmolality and hyponatremia: Secondary | ICD-10-CM | POA: Diagnosis not present

## 2022-06-28 DIAGNOSIS — M25552 Pain in left hip: Secondary | ICD-10-CM | POA: Diagnosis not present

## 2022-06-28 DIAGNOSIS — I2699 Other pulmonary embolism without acute cor pulmonale: Secondary | ICD-10-CM | POA: Diagnosis not present

## 2022-06-28 DIAGNOSIS — Z9981 Dependence on supplemental oxygen: Secondary | ICD-10-CM | POA: Diagnosis not present

## 2022-06-28 DIAGNOSIS — I110 Hypertensive heart disease with heart failure: Secondary | ICD-10-CM | POA: Diagnosis not present

## 2022-06-28 DIAGNOSIS — R911 Solitary pulmonary nodule: Secondary | ICD-10-CM | POA: Diagnosis not present

## 2022-06-28 DIAGNOSIS — K219 Gastro-esophageal reflux disease without esophagitis: Secondary | ICD-10-CM | POA: Diagnosis not present

## 2022-06-28 DIAGNOSIS — H3530 Unspecified macular degeneration: Secondary | ICD-10-CM | POA: Diagnosis not present

## 2022-06-28 DIAGNOSIS — I2510 Atherosclerotic heart disease of native coronary artery without angina pectoris: Secondary | ICD-10-CM | POA: Diagnosis not present

## 2022-06-28 DIAGNOSIS — Z9181 History of falling: Secondary | ICD-10-CM | POA: Diagnosis not present

## 2022-06-28 DIAGNOSIS — I5022 Chronic systolic (congestive) heart failure: Secondary | ICD-10-CM | POA: Diagnosis not present

## 2022-06-28 DIAGNOSIS — I480 Paroxysmal atrial fibrillation: Secondary | ICD-10-CM | POA: Diagnosis not present

## 2022-06-28 DIAGNOSIS — Z7901 Long term (current) use of anticoagulants: Secondary | ICD-10-CM | POA: Diagnosis not present

## 2022-06-28 DIAGNOSIS — J441 Chronic obstructive pulmonary disease with (acute) exacerbation: Secondary | ICD-10-CM | POA: Diagnosis not present

## 2022-06-28 DIAGNOSIS — Z7984 Long term (current) use of oral hypoglycemic drugs: Secondary | ICD-10-CM | POA: Diagnosis not present

## 2022-06-29 ENCOUNTER — Other Ambulatory Visit (HOSPITAL_COMMUNITY): Payer: Self-pay

## 2022-06-29 ENCOUNTER — Other Ambulatory Visit (HOSPITAL_COMMUNITY): Payer: Self-pay | Admitting: Adult Health

## 2022-06-29 DIAGNOSIS — I252 Old myocardial infarction: Secondary | ICD-10-CM | POA: Diagnosis not present

## 2022-06-29 DIAGNOSIS — Z7984 Long term (current) use of oral hypoglycemic drugs: Secondary | ICD-10-CM | POA: Diagnosis not present

## 2022-06-29 DIAGNOSIS — I2699 Other pulmonary embolism without acute cor pulmonale: Secondary | ICD-10-CM | POA: Diagnosis not present

## 2022-06-29 DIAGNOSIS — Z7901 Long term (current) use of anticoagulants: Secondary | ICD-10-CM | POA: Diagnosis not present

## 2022-06-29 DIAGNOSIS — Z7951 Long term (current) use of inhaled steroids: Secondary | ICD-10-CM | POA: Diagnosis not present

## 2022-06-29 DIAGNOSIS — K219 Gastro-esophageal reflux disease without esophagitis: Secondary | ICD-10-CM | POA: Diagnosis not present

## 2022-06-29 DIAGNOSIS — H3530 Unspecified macular degeneration: Secondary | ICD-10-CM | POA: Diagnosis not present

## 2022-06-29 DIAGNOSIS — J9600 Acute respiratory failure, unspecified whether with hypoxia or hypercapnia: Secondary | ICD-10-CM | POA: Diagnosis not present

## 2022-06-29 DIAGNOSIS — Z48812 Encounter for surgical aftercare following surgery on the circulatory system: Secondary | ICD-10-CM | POA: Diagnosis not present

## 2022-06-29 DIAGNOSIS — I2510 Atherosclerotic heart disease of native coronary artery without angina pectoris: Secondary | ICD-10-CM | POA: Diagnosis not present

## 2022-06-29 DIAGNOSIS — Z9981 Dependence on supplemental oxygen: Secondary | ICD-10-CM | POA: Diagnosis not present

## 2022-06-29 DIAGNOSIS — I110 Hypertensive heart disease with heart failure: Secondary | ICD-10-CM | POA: Diagnosis not present

## 2022-06-29 DIAGNOSIS — M25552 Pain in left hip: Secondary | ICD-10-CM | POA: Diagnosis not present

## 2022-06-29 DIAGNOSIS — I480 Paroxysmal atrial fibrillation: Secondary | ICD-10-CM | POA: Diagnosis not present

## 2022-06-29 DIAGNOSIS — Z9181 History of falling: Secondary | ICD-10-CM | POA: Diagnosis not present

## 2022-06-29 DIAGNOSIS — I5022 Chronic systolic (congestive) heart failure: Secondary | ICD-10-CM | POA: Diagnosis not present

## 2022-06-29 DIAGNOSIS — Z85038 Personal history of other malignant neoplasm of large intestine: Secondary | ICD-10-CM | POA: Diagnosis not present

## 2022-06-29 DIAGNOSIS — J441 Chronic obstructive pulmonary disease with (acute) exacerbation: Secondary | ICD-10-CM | POA: Diagnosis not present

## 2022-06-29 DIAGNOSIS — R911 Solitary pulmonary nodule: Secondary | ICD-10-CM | POA: Diagnosis not present

## 2022-06-29 DIAGNOSIS — E871 Hypo-osmolality and hyponatremia: Secondary | ICD-10-CM | POA: Diagnosis not present

## 2022-06-29 NOTE — Progress Notes (Signed)
Paramedicine Encounter    Patient ID: Bruce [name], male    DOB: 10/24/1945, 77 y.o.   MRN: [id number]   Complaints- shortness of breath worsened this week   Assessment- CAOx4, warm and dry, short of breath on minimal exertion, no swelling, lungs noted to have rhonchi throughout, he admits to smoking often. Wearing oxygen only in the home- doesn't have portable oxygen to take out when he runs errands.   Compliance with meds- two morning doses missed, one noon dose missed, one evening dose missed.   Pill box filled- for one week   Refills needed- digoxin, sidenafil, eliquis, colace   Meds changes since last visit- none     Social changes- Bruce [name] reports he may be moving [place] but isn't for sure yet. I advised him to keep me updated.    Arrived for home visit for [facility] who reports to be feeling short of breath more than normal today. At rest he is not short of breath but on movement and after it takes several mins to get to baseline.  He states that it is getting harder to breathe when he walks. He is using his oxygen in the home but doesn't have a portable tank to take when he is out of the house and he reports today and yesterday he was without it for extended periods of time. He also admits to smoking often daily and this is contributing to his shortness of breath and coughing. Lungs noted to have rhonchi in all lobes. He is using inhalers. No lower leg swelling noted. Weight is down. Vitals obtained. I reviewed meds- he had a few missed doses over the last week. I provided CHF and COPD education to Bruce [name] and advised him the importance of smoking cessation and use of oxygen and inhalers. He verbalized understanding. Pill box filled for one week. Refills called into [facility]. I plan to see Bruce [name] in one week. I advised him if breathing worsened to call 911 for further. He sees Pulmonary in May. Bruce [name] made the statement he feels his cancer is  coming back- I recommended [facility] and he said he would look into it. I will follow up in one week. Home visit complete.   BP 120/60   Pulse 74   Resp 18   Wt 163 lb (73.9 kg)   SpO2 94%   BMI 21.51 kg/m  Weight yesterday-- didn't weigh Last visit weight--165lbs    [name], EMT-Paramedic [phone]  ACTION: Home visit completed    Patient Care Team: [name], NP as PCP - General (Nurse Practitioner) Laurier Nancy, MD as PCP - Cardiology (Cardiology)  Patient Active Problem List   Diagnosis Date Noted   Chronic systolic heart failure 05/04/2022   Pulmonary hypertension, unspecified 04/14/2022   Acute systolic HF (heart failure) 04/13/2022   Pulmonary embolism 04/09/2022   Acute respiratory failure with hypoxia 04/09/2022   Lactic acidosis 04/09/2022   Hypokalemia 04/09/2022   COPD with acute exacerbation 05/03/2020   PAF (paroxysmal atrial fibrillation)    Syncope 12/31/2018   Intracranial bleed 12/31/2018   AKI (acute kidney injury)    Frontal lobe contusion    Multiple closed facial bone fractures    NSTEMI (non-ST elevated myocardial infarction) 10/10/2018   Recurrent syncope 04/28/2018   Malignant neoplasm of prostate 07/13/2016   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   Visual hallucination 03/18/2015   Auditory hallucination 03/18/2015   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 01/01/2015   Tibial plateau fracture 12/29/2014   Limb ischemia 11/18/2013   Blunt trauma of lower leg 10/08/2013   Traumatic compartment syndrome 10/08/2013   Acute blood loss anemia 10/08/2013   Anxiety disorder 10/08/2013   Dyslipidemia 10/08/2013   GERD (gastroesophageal reflux disease) 10/08/2013   History of MI (myocardial infarction) 10/08/2013   Anticoagulated 10/08/2013   Hypertension    Tibia/fibula fracture 10/06/2013   CAD (coronary artery disease) 08/16/2010   Edema 08/16/2010   COPD  (chronic obstructive pulmonary disease) (HCC) 08/16/2010   DVT of leg (deep venous thrombosis) (HCC) 08/16/2010   Leg cramps 08/16/2010    Current Outpatient Medications:    albuterol (PROVENTIL HFA;VENTOLIN HFA) 108 (90 Base) MCG/ACT inhaler, Inhale 2 puffs into the lungs every 6 (six) hours as needed for wheezing or shortness of breath., Disp: 1 Inhaler, Rfl: 2   amiodarone (PACERONE) 200 MG tablet, Take 0.5 tablets (100 mg total) by mouth daily., Disp: 30 tablet, Rfl: 11   apixaban (ELIQUIS) 5 MG TABS tablet, Take 1 tablet (5 mg total) by mouth 2 (two) times daily., Disp: 60 tablet, Rfl: 3   budesonide-formoterol (SYMBICORT) 160-4.5 MCG/ACT inhaler, Inhale 2 puffs into the lungs 2 (two) times daily., Disp: 10.2 g, Rfl: 3   cetirizine (ZYRTEC) 10 MG tablet, Take 1 tablet (10 mg total) by mouth daily., Disp: 30 tablet, Rfl: 11   digoxin (LANOXIN) 0.125 MG tablet, Take 1 tablet (0.125 mg total) by mouth daily., Disp: 30 tablet, Rfl: 6   docusate sodium (COLACE) 100 MG capsule, Take 1 capsule (100 mg total) by mouth daily., Disp: 90 capsule, Rfl: 3   FARXIGA 10 MG TABS tablet, TAKE ONE TABLET BY MOUTH EVERY MORNING, Disp: 90 tablet, Rfl: 0   fluticasone (FLONASE) 50 MCG/ACT nasal spray, Place 1 spray into both nostrils daily., Disp: , Rfl:    lidocaine (LIDODERM) 5 %, Place 1 patch onto the skin daily. Remove & Discard patch within 12 hours or as directed by MD (Patient taking differently: Place 1 patch onto the skin daily. Remove & Discard patch within 12 hours or as directed by MD as needed), Disp: 30 patch, Rfl: 11   losartan (COZAAR) 25 MG tablet, Take 0.5 tablets (12.5 mg total) by mouth at bedtime., Disp: 45 tablet, Rfl: 1   Multiple Vitamin (MULTIVITAMIN) tablet, Take 1 tablet by mouth daily. For Men, Disp: , Rfl:    nitroGLYCERIN (NITROSTAT) 0.4 MG SL tablet, Place 0.4 mg under the tongue every 5 (five) minutes as needed for chest pain., Disp: , Rfl:    Omega-3 Fatty Acids (FISH OIL PO),  Take 1,000 mg by mouth every evening., Disp: , Rfl:    omeprazole (PRILOSEC) 40 MG capsule, Take 40 mg by mouth daily., Disp: , Rfl:    ranolazine (RANEXA) 500 MG 12 hr tablet, Take 1 tablet (500 mg total) by mouth 2 (two) times daily., Disp: 60 tablet, Rfl: 6   rOPINIRole (REQUIP) 1 MG tablet, TAKE ONE TABLET BY MOUTH EVERY NIGHT AT BEDTIME FOR RESTLESS LEGS, Disp: 30 tablet, Rfl: 3   rosuvastatin (CRESTOR) 10 MG tablet, Take 1 tablet (10 mg total) by mouth daily., Disp: 30 tablet, Rfl: 6   sildenafil (REVATIO) 20 MG tablet, Take 1 tablet (20 mg total) by mouth 3 (three) times daily., Disp: 90 tablet, Rfl: 0 Allergies  Allergen Reactions   Adhesive [Tape] Other (See Comments)    After right leg fracture surgery, pt  developed a large blister where tape was applied to his right leg. OK to use paper tape.   Imdur [Isosorbide Dinitrate] Other (See Comments)    hallucinations   Singulair [Montelukast Sodium] Other (See Comments)    Hallucinations      Social History   Socioeconomic History   Marital status: Single    Spouse name: Not on file   Number of children: 2   Years of education: Not on file   Highest education level: 6th grade  Occupational History   Not on file  Tobacco Use   Smoking status: Some Days    Packs/day: 0.50    Years: 52.00    Additional pack years: 0.00    Total pack years: 26.00    Types: Cigarettes   Smokeless tobacco: Never  Vaping Use   Vaping Use: Never used  Substance and Sexual Activity   Alcohol use: No    Alcohol/week: 0.0 standard drinks of alcohol    Comment: Stopped 2009   Drug use: No   Sexual activity: Not Currently  Other Topics Concern   Not on file  Social History Narrative   ** Merged History Encounter **       Social Determinants of Health   Financial Resource Strain: High Risk (04/12/2022)   Overall Financial Resource Strain (CARDIA)    Difficulty of Paying Living Expenses: Hard  Food Insecurity: No Food Insecurity  (05/23/2022)   Hunger Vital Sign    Worried About Running Out of Food in the Last Year: Never true    Ran Out of Food in the Last Year: Never true  Transportation Needs: No Transportation Needs (05/23/2022)   PRAPARE - Administrator, Civil Service (Medical): No    Lack of Transportation (Non-Medical): No  Physical Activity: Inactive (02/05/2017)   Exercise Vital Sign    Days of Exercise per Week: 0 days    Minutes of Exercise per Session: 0 min  Stress: Stress Concern Present (02/05/2017)   [employer] of Occupational Health - Occupational Stress Questionnaire    Feeling of Stress : Rather much  Social Connections: Moderately Isolated (02/05/2017)   Social Connection and Isolation Panel [NHANES]    Frequency of Communication with Friends and Family: More than three times a week    Frequency of Social Gatherings with Friends and Family: Once a week    Attends Religious Services: Never    Database administrator or Organizations: No    Attends Banker Meetings: Never    Marital Status: Divorced  Catering manager Violence: Not At Risk (04/14/2022)   Humiliation, Afraid, Rape, and Kick questionnaire    Fear of Current or Ex-Partner: No    Emotionally Abused: No    Physically Abused: No    Sexually Abused: No    Physical Exam      Future Appointments  Date Time Provider Department Center  07/11/2022 11:30 AM [facility]IC-CT [facility]-CT [facility]Out[facility]  07/14/2022 10:00 AM Otho Ket, RN [facility] None  07/20/2022 11:30 AM Raechel Chute, MD [facility] None  08/03/2022  2:20 PM Bensimhon, Bevelyn Buckles, MD [facility] None  09/01/2022 10:30 AM [name], NP [facility] None

## 2022-06-30 DIAGNOSIS — J449 Chronic obstructive pulmonary disease, unspecified: Secondary | ICD-10-CM | POA: Diagnosis not present

## 2022-06-30 DIAGNOSIS — I10 Essential (primary) hypertension: Secondary | ICD-10-CM | POA: Diagnosis not present

## 2022-07-03 DIAGNOSIS — Z9181 History of falling: Secondary | ICD-10-CM | POA: Diagnosis not present

## 2022-07-03 DIAGNOSIS — J441 Chronic obstructive pulmonary disease with (acute) exacerbation: Secondary | ICD-10-CM | POA: Diagnosis not present

## 2022-07-03 DIAGNOSIS — Z48812 Encounter for surgical aftercare following surgery on the circulatory system: Secondary | ICD-10-CM | POA: Diagnosis not present

## 2022-07-03 DIAGNOSIS — H3530 Unspecified macular degeneration: Secondary | ICD-10-CM | POA: Diagnosis not present

## 2022-07-03 DIAGNOSIS — Z7984 Long term (current) use of oral hypoglycemic drugs: Secondary | ICD-10-CM | POA: Diagnosis not present

## 2022-07-03 DIAGNOSIS — I2699 Other pulmonary embolism without acute cor pulmonale: Secondary | ICD-10-CM | POA: Diagnosis not present

## 2022-07-03 DIAGNOSIS — I2510 Atherosclerotic heart disease of native coronary artery without angina pectoris: Secondary | ICD-10-CM | POA: Diagnosis not present

## 2022-07-03 DIAGNOSIS — I480 Paroxysmal atrial fibrillation: Secondary | ICD-10-CM | POA: Diagnosis not present

## 2022-07-03 DIAGNOSIS — Z7951 Long term (current) use of inhaled steroids: Secondary | ICD-10-CM | POA: Diagnosis not present

## 2022-07-03 DIAGNOSIS — K219 Gastro-esophageal reflux disease without esophagitis: Secondary | ICD-10-CM | POA: Diagnosis not present

## 2022-07-03 DIAGNOSIS — M25552 Pain in left hip: Secondary | ICD-10-CM | POA: Diagnosis not present

## 2022-07-03 DIAGNOSIS — I5022 Chronic systolic (congestive) heart failure: Secondary | ICD-10-CM | POA: Diagnosis not present

## 2022-07-03 DIAGNOSIS — I110 Hypertensive heart disease with heart failure: Secondary | ICD-10-CM | POA: Diagnosis not present

## 2022-07-03 DIAGNOSIS — E871 Hypo-osmolality and hyponatremia: Secondary | ICD-10-CM | POA: Diagnosis not present

## 2022-07-03 DIAGNOSIS — Z85038 Personal history of other malignant neoplasm of large intestine: Secondary | ICD-10-CM | POA: Diagnosis not present

## 2022-07-03 DIAGNOSIS — Z7901 Long term (current) use of anticoagulants: Secondary | ICD-10-CM | POA: Diagnosis not present

## 2022-07-03 DIAGNOSIS — R911 Solitary pulmonary nodule: Secondary | ICD-10-CM | POA: Diagnosis not present

## 2022-07-03 DIAGNOSIS — J9600 Acute respiratory failure, unspecified whether with hypoxia or hypercapnia: Secondary | ICD-10-CM | POA: Diagnosis not present

## 2022-07-03 DIAGNOSIS — I252 Old myocardial infarction: Secondary | ICD-10-CM | POA: Diagnosis not present

## 2022-07-03 DIAGNOSIS — Z9981 Dependence on supplemental oxygen: Secondary | ICD-10-CM | POA: Diagnosis not present

## 2022-07-06 ENCOUNTER — Other Ambulatory Visit (HOSPITAL_COMMUNITY): Payer: Self-pay

## 2022-07-06 ENCOUNTER — Other Ambulatory Visit: Payer: Self-pay

## 2022-07-06 ENCOUNTER — Emergency Department (HOSPITAL_COMMUNITY): Payer: Medicare Other

## 2022-07-06 ENCOUNTER — Emergency Department (HOSPITAL_COMMUNITY)
Admission: EM | Admit: 2022-07-06 | Discharge: 2022-07-06 | Discharge status: Against Medical Advice | Payer: Medicare Other | Attending: Emergency Medicine | Admitting: Emergency Medicine

## 2022-07-06 DIAGNOSIS — J449 Chronic obstructive pulmonary disease, unspecified: Secondary | ICD-10-CM | POA: Insufficient documentation

## 2022-07-06 DIAGNOSIS — R0602 Shortness of breath: Secondary | ICD-10-CM | POA: Insufficient documentation

## 2022-07-06 DIAGNOSIS — Z5321 Procedure and treatment not carried out due to patient leaving prior to being seen by health care provider: Secondary | ICD-10-CM | POA: Insufficient documentation

## 2022-07-06 DIAGNOSIS — Z20822 Contact with and (suspected) exposure to covid-19: Secondary | ICD-10-CM | POA: Insufficient documentation

## 2022-07-06 LAB — CBC WITH DIFFERENTIAL/PLATELET
Abs Immature Granulocytes: 0.02 10*3/uL (ref 0.00–0.07)
Basophils Absolute: 0 10*3/uL (ref 0.0–0.1)
Basophils Relative: 0 %
Eosinophils Absolute: 0.2 10*3/uL (ref 0.0–0.5)
Eosinophils Relative: 3 %
HCT: 36.8 % — ABNORMAL LOW (ref 39.0–52.0)
Hemoglobin: 11.6 g/dL — ABNORMAL LOW (ref 13.0–17.0)
Immature Granulocytes: 0 %
Lymphocytes Relative: 10 %
Lymphs Abs: 0.7 10*3/uL (ref 0.7–4.0)
MCH: 30.1 pg (ref 26.0–34.0)
MCHC: 31.5 g/dL (ref 30.0–36.0)
MCV: 95.6 fL (ref 80.0–100.0)
Monocytes Absolute: 0.5 10*3/uL (ref 0.1–1.0)
Monocytes Relative: 7 %
Neutro Abs: 5.5 10*3/uL (ref 1.7–7.7)
Neutrophils Relative %: 80 %
Platelets: 276 10*3/uL (ref 150–400)
RBC: 3.85 MIL/uL — ABNORMAL LOW (ref 4.22–5.81)
RDW: 15.8 % — ABNORMAL HIGH (ref 11.5–15.5)
WBC: 6.9 10*3/uL (ref 4.0–10.5)
nRBC: 0 % (ref 0.0–0.2)

## 2022-07-06 LAB — COMPREHENSIVE METABOLIC PANEL
ALT: 18 U/L (ref 0–44)
AST: 19 U/L (ref 15–41)
Albumin: 2.9 g/dL — ABNORMAL LOW (ref 3.5–5.0)
Alkaline Phosphatase: 93 U/L (ref 38–126)
Anion gap: 12 (ref 5–15)
BUN: 14 mg/dL (ref 8–23)
CO2: 20 mmol/L — ABNORMAL LOW (ref 22–32)
Calcium: 9.4 mg/dL (ref 8.9–10.3)
Chloride: 109 mmol/L (ref 98–111)
Creatinine, Ser: 1.15 mg/dL (ref 0.61–1.24)
GFR, Estimated: >60 mL/min (ref >60)
Glucose, Bld: 89 mg/dL (ref 70–99)
Potassium: 3.8 mmol/L (ref 3.5–5.1)
Sodium: 141 mmol/L (ref 135–145)
Total Bilirubin: 0.5 mg/dL (ref 0.3–1.2)
Total Protein: 6 g/dL — ABNORMAL LOW (ref 6.5–8.1)

## 2022-07-06 LAB — RESP PANEL BY RT-PCR (RSV, FLU A&B, COVID)  RVPGX2
Influenza A by PCR: NEGATIVE
Influenza B by PCR: NEGATIVE
Resp Syncytial Virus by PCR: NEGATIVE
SARS Coronavirus 2 by RT PCR: NEGATIVE

## 2022-07-06 NOTE — ED Triage Notes (Signed)
Pt presents after home visit today where caregiver recommended he call EMS but instead he wanted to come POV. Pt has COPD and is on 3L at home. Increasing shob today.  Sats at home in the 80s.  Presented to ED triage on NO O2.  Visibly shob.  Improved with oxygen.  Non productive cough reported with soreness to ribs. No recent weight gain or swelling in his legs.

## 2022-07-06 NOTE — ED Provider Triage Note (Signed)
Emergency Medicine Provider Triage Evaluation Note  Bruce [name] , a 77 y.o. male  was evaluated in triage.  Pt complains of shortness of breath.  Reports history of COPD.  He was advised by his home health nurse to come to the emergency room today as he reported hearing signs of congestion in patient's lungs.  Patient reports he has been having a nonproductive cough.  States that he has been using his medications for COPD as prescribed.  Is on O2 at home.  Review of Systems  Positive: As above Negative: As above  Physical Exam  BP 100/65 (BP Location: Right Arm)   Pulse 78   Temp 98.8 F (37.1 C)   Resp 20   SpO2 94%  Gen:   Awake, no distress  Resp:  Increased work of breathing, no audible wheezing or crackles noted MSK:   Moves extremities without difficulty  Other:    Medical Decision Making  Medically screening exam initiated at 6:22 PM.  Appropriate orders placed.  Bruce [name] was informed that the remainder of the evaluation will be completed by another provider, this initial triage assessment does not replace that evaluation, and the importance of remaining in the ED until their evaluation is complete.     Bruce Knudsen, PA-C 07/06/22 Bruce [name]

## 2022-07-06 NOTE — Progress Notes (Signed)
Paramedicine Encounter    Patient ID: Bruce [name], male    DOB: Sep 20, 1945, 77 y.o.   MRN: [id number]   Complaints- short of breath, coughing, congestion,"do not feel good"  Assessment- CAOX4, warm, dry seated on the couch in short of breath not on oxygen as he is supposed to be continuously. Lungs noted rhonchi in all fields with some wheezing, congested- with runny nose- clear mucus production, denied any dizziness, chest pain or swelling. No lower leg edema. Vitals as noted. Very short of breath upon walking to bathroom- appeared pale when returning to couch and unable to complete sentences. He placed his oxygen back on and was able to get his breathing under control.   Compliance with meds- missed two evening and one morning dose. Has been using inhalers, has not used anything OTC for his cough.   Pill box filled- for one week.   Refills needed- Ranexa  Farxiga   Meds changes since last visit- none     Social changes- none    BP 110/60   Pulse 77   Resp (!) 22   Wt 162 lb (73.5 kg)   SpO2 (!) 85%   BMI 21.37 kg/m  Weight yesterday-didn't weigh  Last visit weight-163lbs    Arrived for home visit for [facility] to see him seated on the couch CAOX4, warm to touch on arms and diaphoretic on his forehead. He reports he had just got up from a nap where he says he wasn't using his oxygen during this and reports having a productive cough and runny nose all week. He says his cough has gotten really bad and feels like his oxygen and inhalers are not helping. Room air sat was 85%- he got up to use the bathroom and appeared very short of breath and pale upon returning to sit down and was unable to complete full sentences. Upon listening to lung sounds rhonchi noted throughout with some wheezing. I noted no lower leg edema- weight is stable. He placed his oxygen back on and was able to regain his breathing stability. I recommended we call for an EMS unit for him to be transported and he was  adamant on his friend [name] taking him via POV. I advised him that he would need to take his oxygen if he could safely do so and he only has large tanks and has not yet gotten a portable oxygen and says it's hard to pull the larger tanks due to his fatigue when walking. I obtained full vitals as noted. I reviewed his meds and filled pill box for one week. I continued to stress the importance of being transported and he advised [name] is taking him once I leave and I confirmed this with her before leaving. I advised them if anything changed or worsened to call 911 and they both agreed. Home visit complete. I will plan to see him in one week pending discharge and changes before hand.     [name], EMT-Paramedic [phone]  ACTION: Home visit completed    Patient Care Team: [name], NP as PCP - General (Nurse Practitioner) Laurier Nancy, MD as PCP - Cardiology (Cardiology)  Patient Active Problem List   Diagnosis Date Noted   Chronic systolic heart failure (HCC) 05/04/2022   Pulmonary hypertension, unspecified (HCC) 04/14/2022   Acute systolic HF (heart failure) (HCC) 04/13/2022   Pulmonary embolism (HCC) 04/09/2022   Acute respiratory failure with hypoxia (HCC) 04/09/2022   Lactic acidosis 04/09/2022   Hypokalemia 04/09/2022  COPD with acute exacerbation (HCC) 05/03/2020   PAF (paroxysmal atrial fibrillation) (HCC)    Syncope 12/31/2018   Intracranial bleed (HCC) 12/31/2018   AKI (acute kidney injury) (HCC)    Frontal lobe contusion (HCC)    Multiple closed facial bone fractures (HCC)    NSTEMI (non-ST elevated myocardial infarction) (HCC) 10/10/2018   Recurrent syncope 04/28/2018   Malignant neoplasm of prostate (HCC) 07/13/2016   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   Visual hallucination 03/18/2015   Auditory hallucination 03/18/2015   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 01/01/2015   Tibial plateau fracture 12/29/2014    Limb ischemia 11/18/2013   Blunt trauma of lower leg 10/08/2013   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HCC) 10/08/2013   Acute blood loss anemia 10/08/2013   Anxiety disorder 10/08/2013   Dyslipidemia 10/08/2013   GERD (gastroesophageal reflux disease) 10/08/2013   History of MI (myocardial infarction) 10/08/2013   Anticoagulated 10/08/2013   Hypertension    Tibia/fibula fracture 10/06/2013   CAD (coronary artery disease) 08/16/2010   Edema 08/16/2010   COPD (chronic obstructive pulmonary disease) (HCC) 08/16/2010   DVT of leg (deep venous thrombosis) (HCC) 08/16/2010   Leg cramps 08/16/2010    Current Outpatient Medications:    albuterol (PROVENTIL HFA;VENTOLIN HFA) 108 (90 Base) MCG/ACT inhaler, Inhale 2 puffs into the lungs every 6 (six) hours as needed for wheezing or shortness of breath., Disp: 1 Inhaler, Rfl: 2   amiodarone (PACERONE) 200 MG tablet, Take 0.5 tablets (100 mg total) by mouth daily., Disp: 30 tablet, Rfl: 11   apixaban (ELIQUIS) 5 MG TABS tablet, Take 1 tablet (5 mg total) by mouth 2 (two) times daily., Disp: 60 tablet, Rfl: 3   budesonide-formoterol (SYMBICORT) 160-4.5 MCG/ACT inhaler, Inhale 2 puffs into the lungs 2 (two) times daily., Disp: 10.2 g, Rfl: 3   cetirizine (ZYRTEC) 10 MG tablet, Take 1 tablet (10 mg total) by mouth daily., Disp: 30 tablet, Rfl: 11   digoxin (LANOXIN) 0.125 MG tablet, Take 1 tablet (0.125 mg total) by mouth daily., Disp: 30 tablet, Rfl: 6   docusate sodium (COLACE) 100 MG capsule, Take 1 capsule (100 mg total) by mouth daily., Disp: 90 capsule, Rfl: 3   FARXIGA 10 MG TABS tablet, TAKE ONE TABLET BY MOUTH EVERY MORNING, Disp: 90 tablet, Rfl: 0   fluticasone (FLONASE) 50 MCG/ACT nasal spray, Place 1 spray into both nostrils daily., Disp: , Rfl:    lidocaine (LIDODERM) 5 %, Place 1 patch onto the skin daily. Remove & Discard patch within 12 hours or as directed by MD (Patient taking differently: Place 1 patch onto the skin daily. Remove & Discard  patch within 12 hours or as directed by MD as needed), Disp: 30 patch, Rfl: 11   losartan (COZAAR) 25 MG tablet, Take 0.5 tablets (12.5 mg total) by mouth at bedtime., Disp: 45 tablet, Rfl: 1   Multiple Vitamin (MULTIVITAMIN) tablet, Take 1 tablet by mouth daily. For Men, Disp: , Rfl:    nitroGLYCERIN (NITROSTAT) 0.4 MG SL tablet, Place 0.4 mg under the tongue every 5 (five) minutes as needed for chest pain., Disp: , Rfl:    Omega-3 Fatty Acids (FISH OIL PO), Take 1,000 mg by mouth every evening., Disp: , Rfl:    omeprazole (PRILOSEC) 40 MG capsule, Take 40 mg by mouth daily., Disp: , Rfl:    ranolazine (RANEXA) 500 MG 12 hr tablet, Take 1 tablet (500 mg total) by mouth 2 (two) times daily., Disp: 60 tablet, Rfl: 6  rOPINIRole (REQUIP) 1 MG tablet, TAKE ONE TABLET BY MOUTH EVERY NIGHT AT BEDTIME FOR RESTLESS LEGS, Disp: 30 tablet, Rfl: 3   rosuvastatin (CRESTOR) 10 MG tablet, Take 1 tablet (10 mg total) by mouth daily., Disp: 30 tablet, Rfl: 6   sildenafil (REVATIO) 20 MG tablet, TAKE ONE TABLET (20 MG TOTAL) BY MOUTH THREE (THREE) TIMES DAILY., Disp: 90 tablet, Rfl: 0 Allergies  Allergen Reactions   Adhesive [Tape] Other (See Comments)    After right leg fracture surgery, pt developed a large blister where tape was applied to his right leg. OK to use paper tape.   Imdur [Isosorbide Dinitrate] Other (See Comments)    hallucinations   Singulair [Montelukast Sodium] Other (See Comments)    Hallucinations      Social History   Socioeconomic History   Marital status: Single    Spouse name: Not on file   Number of children: 2   Years of education: Not on file   Highest education level: 6th grade  Occupational History   Not on file  Tobacco Use   Smoking status: Some Days    Packs/day: 0.50    Years: 52.00    Additional pack years: 0.00    Total pack years: 26.00    Types: Cigarettes   Smokeless tobacco: Never  Vaping Use   Vaping Use: Never used  Substance and Sexual Activity    Alcohol use: No    Alcohol/week: 0.0 standard drinks of alcohol    Comment: Stopped 2009   Drug use: No   Sexual activity: Not Currently  Other Topics Concern   Not on file  Social History Narrative   ** Merged History Encounter **       Social Determinants of Health   Financial Resource Strain: High Risk (04/12/2022)   Overall Financial Resource Strain (CARDIA)    Difficulty of Paying Living Expenses: Hard  Food Insecurity: No Food Insecurity (05/23/2022)   Hunger Vital Sign    Worried About Running Out of Food in the Last Year: Never true    Ran Out of Food in the Last Year: Never true  Transportation Needs: No Transportation Needs (05/23/2022)   PRAPARE - Administrator, Civil Service (Medical): No    Lack of Transportation (Non-Medical): No  Physical Activity: Inactive (02/05/2017)   Exercise Vital Sign    Days of Exercise per Week: 0 days    Minutes of Exercise per Session: 0 min  Stress: Stress Concern Present (02/05/2017)   [employer] of Occupational Health - Occupational Stress Questionnaire    Feeling of Stress : Rather much  Social Connections: Moderately Isolated (02/05/2017)   Social Connection and Isolation Panel [NHANES]    Frequency of Communication with Friends and Family: More than three times a week    Frequency of Social Gatherings with Friends and Family: Once a week    Attends Religious Services: Never    Database administrator or Organizations: No    Attends Banker Meetings: Never    Marital Status: Divorced  Catering manager Violence: Not At Risk (04/14/2022)   Humiliation, Afraid, Rape, and Kick questionnaire    Fear of Current or Ex-Partner: No    Emotionally Abused: No    Physically Abused: No    Sexually Abused: No    Physical Exam Vitals reviewed.  Constitutional:      Appearance: He is normal weight. He is ill-appearing.  HENT:     Head: Normocephalic.  Nose: Congestion and rhinorrhea present.  Eyes:      Pupils: Pupils are equal, round, and reactive to light.  Cardiovascular:     Rate and Rhythm: Normal rate.     Pulses: Normal pulses.  Pulmonary:     Effort: Respiratory distress present.     Breath sounds: Rhonchi present.  Abdominal:     General: Abdomen is flat.     Palpations: Abdomen is soft.  Musculoskeletal:        General: Normal range of motion.     Cervical back: Normal range of motion.     Right lower leg: No edema.     Left lower leg: No edema.  Skin:    General: Skin is warm and dry.     Capillary Refill: Capillary refill takes less than 2 seconds.     Coloration: Skin is pale.  Neurological:     Mental Status: He is alert. Mental status is at baseline.  Psychiatric:        Mood and Affect: Mood normal.         Future Appointments  Date Time Provider Department Center  07/11/2022 11:30 AM [facility]-CT [facility]-CT [facility]-Outpati  07/14/2022 10:00 AM Otho Ket, RN [facility] None  07/20/2022 11:30 AM Raechel Chute, MD [facility] None  08/03/2022  2:20 PM Bensimhon, Bevelyn Buckles, MD [facility] None  09/01/2022 10:30 AM [name], NP [facility] None

## 2022-07-11 ENCOUNTER — Ambulatory Visit
Admission: RE | Admit: 2022-07-11 | Discharge: 2022-07-11 | Disposition: A | Discharge status: Home / Self Care | Payer: Medicare Other | Source: Ambulatory Visit | Attending: Emergency Medicine | Admitting: Emergency Medicine

## 2022-07-11 DIAGNOSIS — J90 Pleural effusion, not elsewhere classified: Secondary | ICD-10-CM | POA: Insufficient documentation

## 2022-07-11 DIAGNOSIS — I700 Atherosclerosis of aorta: Secondary | ICD-10-CM | POA: Insufficient documentation

## 2022-07-11 DIAGNOSIS — J439 Emphysema, unspecified: Secondary | ICD-10-CM | POA: Insufficient documentation

## 2022-07-11 DIAGNOSIS — R918 Other nonspecific abnormal finding of lung field: Secondary | ICD-10-CM | POA: Diagnosis present

## 2022-07-11 DIAGNOSIS — Z86711 Personal history of pulmonary embolism: Secondary | ICD-10-CM | POA: Insufficient documentation

## 2022-07-12 ENCOUNTER — Telehealth (HOSPITAL_COMMUNITY): Payer: Self-pay | Admitting: Licensed Clinical Social Worker

## 2022-07-12 NOTE — Telephone Encounter (Signed)
HF Paramedicine Team Based Care Meeting  Three Months Post Enrollment Assessment  HF MD- NA  HF NP - Amy Clegg NP-C   [facility] HF Paramedicine  Bruce [name]  [facility] admit within the last 30 days for heart failure? Sent to hospital for COPD then he left AMA  Medications concerns? Informed by pt friend that he has been throwing his medications away.  Planning to see tomorrow to assess why he is doing this and work to educate for compliance.  Transportation issues? none  SDOH concerns?  Continuing to live with a friend and looking into other options.  Pt living situation is not ideal/unclean.  [facility] Referrals Placed: none at this time  Barriers to discharge? Medically fragile- pt does not trust other healthcare providers so paramedic is one of the few people he allows to assist- hopeful this can lead to eventual compliance.    Burna Sis, LCSW Clinical Social Worker [facility] Desk#: [phone] Cell#: [phone]

## 2022-07-13 ENCOUNTER — Telehealth (HOSPITAL_COMMUNITY): Payer: Self-pay

## 2022-07-13 ENCOUNTER — Other Ambulatory Visit (HOSPITAL_COMMUNITY): Payer: Self-pay

## 2022-07-13 ENCOUNTER — Telehealth (HOSPITAL_COMMUNITY): Payer: Self-pay | Admitting: Licensed Clinical Social Worker

## 2022-07-13 ENCOUNTER — Telehealth (HOSPITAL_COMMUNITY): Payer: Self-pay | Admitting: Pharmacy Technician

## 2022-07-13 NOTE — Telephone Encounter (Addendum)
Advanced Heart Failure Patient Advocate Encounter  Patient has new insurance, now has LIS. Co-pays should be $11 or less.     Archer Asa, CPhT

## 2022-07-13 NOTE — Telephone Encounter (Signed)
H&V Care Navigation CSW Progress Note  Clinical Social Worker informed by community paramedic that pt pharmacy is not able to fill meds due to insurance concerns.  CSW worked with pharmacy advocate who was able to find updated insurance information for him-CSW provided to pharmacy who confirmed this worked and that his medications are being filled and will be under $7.  CSW attempted to call pt friend to inform but unable to reach or leave VM- sent text informing of above.   SDOH Screenings   Food Insecurity: No Food Insecurity (05/23/2022)  Housing: High Risk (05/23/2022)  Transportation Needs: No Transportation Needs (05/23/2022)  Utilities: Not At Risk (04/14/2022)  Financial Resource Strain: High Risk (04/12/2022)  Physical Activity: Inactive (02/05/2017)  Social Connections: Moderately Isolated (02/05/2017)  Stress: Stress Concern Present (02/05/2017)  Tobacco Use: High Risk (06/21/2022)   Burna Sis, LCSW Clinical Social Worker [facility] Desk#: [phone] Cell#: [phone]

## 2022-07-13 NOTE — Progress Notes (Signed)
Paramedicine Encounter    Patient ID: Bruce [name], male    DOB: September 08, 1945, 77 y.o.   MRN: [id number]   Complaints- shortness of breath, cough, fatigue   Assessment- CAOX4, seated short of breath from standing to open front door to let me in, not wearing oxygen. Lungs noted to have wheezing and congestion present. He reports coughing up clear mucus X2 weeks. No fever. No lower leg edema or swelling.   Compliance with meds- missed one week of morning meds, noon meds, took 3 doses of evening meds in last 7 days. Reports he is using his inhalers and flonase.   Pill box filled- for one week.   Refills needed- ranexa, farxiga, flonase   Meds changes since last visit- none     Social changes- insurance medicine coverage issue- realyed same to patient and LCSW Jenna to make them aware for him to follow up with [facility] Pharmacist Thayer Ohm or [facility] about same and his medicaid case Financial controller.    BP (!) 140/70   Pulse 77   Resp 20   Wt 162 lb (73.5 kg)   SpO2 90%   BMI 21.37 kg/m  Weight yesterday-did not weigh  Last visit weight-- 164lbs   Arrived for home visit for [facility] who reports to be feeling short of breath, fatigued and continuing to have the productive cough. He stated he is feeling about the same as last week. Last weeks visit I sent him to the ER due to his complaints of shortness of breath, low oxygen saturations, severe fatigue with coughing as he is prone to pneumonia. He left AMA 2 hours after waiting in the ER. Vitals and assessment as noted this week. I reviewed meds and confirmed same, he missed every day this weeks morning meds and noon doses. He only took three evening doses of his meds in the last week. I explained the importance of med compliance and he verbalized understanding. I reviewed upcoming appointments with him and he agreed with plans. Home visit complete.  I will see Bruce [name] in two weeks. - Dede will be following up next week in my absence. He is aggreable and  understands to call 911 in case of emergency or call clinic if needed.     [name], EMT-Paramedic [phone]  ACTION: Home visit completed    Patient Care Team: [name], NP as PCP - General (Nurse Practitioner) Laurier Nancy, MD as PCP - Cardiology (Cardiology)  Patient Active Problem List   Diagnosis Date Noted   Chronic systolic heart failure (HCC) 05/04/2022   Pulmonary hypertension, unspecified (HCC) 04/14/2022   Acute systolic HF (heart failure) (HCC) 04/13/2022   Pulmonary embolism (HCC) 04/09/2022   Acute respiratory failure with hypoxia (HCC) 04/09/2022   Lactic acidosis 04/09/2022   Hypokalemia 04/09/2022   COPD with acute exacerbation (HCC) 05/03/2020   PAF (paroxysmal atrial fibrillation) (HCC)    Syncope 12/31/2018   Intracranial bleed (HCC) 12/31/2018   AKI (acute kidney injury) (HCC)    Frontal lobe contusion (HCC)    Multiple closed facial bone fractures (HCC)    NSTEMI (non-ST elevated myocardial infarction) (HCC) 10/10/2018   Recurrent syncope 04/28/2018   Malignant neoplasm of prostate (HCC) 07/13/2016   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   Visual hallucination 03/18/2015   Auditory hallucination 03/18/2015   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 01/01/2015   Tibial plateau fracture 12/29/2014   Limb ischemia 11/18/2013   Blunt trauma of lower leg 10/08/2013   Traumatic  compartment syndrome (HCC) 10/08/2013   Acute blood loss anemia 10/08/2013   Anxiety disorder 10/08/2013   Dyslipidemia 10/08/2013   GERD (gastroesophageal reflux disease) 10/08/2013   History of MI (myocardial infarction) 10/08/2013   Anticoagulated 10/08/2013   Hypertension    Tibia/fibula fracture 10/06/2013   CAD (coronary artery disease) 08/16/2010   Edema 08/16/2010   COPD (chronic obstructive pulmonary disease) (HCC) 08/16/2010   DVT of leg (deep venous thrombosis) (HCC) 08/16/2010   Leg cramps 08/16/2010    Current  Outpatient Medications:    albuterol (PROVENTIL HFA;VENTOLIN HFA) 108 (90 Base) MCG/ACT inhaler, Inhale 2 puffs into the lungs every 6 (six) hours as needed for wheezing or shortness of breath., Disp: 1 Inhaler, Rfl: 2   amiodarone (PACERONE) 200 MG tablet, Take 0.5 tablets (100 mg total) by mouth daily., Disp: 30 tablet, Rfl: 11   apixaban (ELIQUIS) 5 MG TABS tablet, Take 1 tablet (5 mg total) by mouth 2 (two) times daily., Disp: 60 tablet, Rfl: 3   budesonide-formoterol (SYMBICORT) 160-4.5 MCG/ACT inhaler, Inhale 2 puffs into the lungs 2 (two) times daily., Disp: 10.2 g, Rfl: 3   cetirizine (ZYRTEC) 10 MG tablet, Take 1 tablet (10 mg total) by mouth daily., Disp: 30 tablet, Rfl: 11   digoxin (LANOXIN) 0.125 MG tablet, Take 1 tablet (0.125 mg total) by mouth daily., Disp: 30 tablet, Rfl: 6   FARXIGA 10 MG TABS tablet, TAKE ONE TABLET BY MOUTH EVERY MORNING, Disp: 90 tablet, Rfl: 0   fluticasone (FLONASE) 50 MCG/ACT nasal spray, Place 1 spray into both nostrils daily., Disp: , Rfl:    lidocaine (LIDODERM) 5 %, Place 1 patch onto the skin daily. Remove & Discard patch within 12 hours or as directed by MD (Patient taking differently: Place 1 patch onto the skin daily. Remove & Discard patch within 12 hours or as directed by MD as needed), Disp: 30 patch, Rfl: 11   losartan (COZAAR) 25 MG tablet, Take 0.5 tablets (12.5 mg total) by mouth at bedtime., Disp: 45 tablet, Rfl: 1   Multiple Vitamin (MULTIVITAMIN) tablet, Take 1 tablet by mouth daily. For Men, Disp: , Rfl:    nitroGLYCERIN (NITROSTAT) 0.4 MG SL tablet, Place 0.4 mg under the tongue every 5 (five) minutes as needed for chest pain., Disp: , Rfl:    Omega-3 Fatty Acids (FISH OIL PO), Take 1,000 mg by mouth every evening., Disp: , Rfl:    omeprazole (PRILOSEC) 40 MG capsule, Take 40 mg by mouth daily., Disp: , Rfl:    ranolazine (RANEXA) 500 MG 12 hr tablet, Take 1 tablet (500 mg total) by mouth 2 (two) times daily., Disp: 60 tablet, Rfl: 6    rOPINIRole (REQUIP) 1 MG tablet, TAKE ONE TABLET BY MOUTH EVERY NIGHT AT BEDTIME FOR RESTLESS LEGS, Disp: 30 tablet, Rfl: 3   rosuvastatin (CRESTOR) 10 MG tablet, Take 1 tablet (10 mg total) by mouth daily., Disp: 30 tablet, Rfl: 6   sildenafil (REVATIO) 20 MG tablet, TAKE ONE TABLET (20 MG TOTAL) BY MOUTH THREE (THREE) TIMES DAILY., Disp: 90 tablet, Rfl: 0   docusate sodium (COLACE) 100 MG capsule, Take 1 capsule (100 mg total) by mouth daily. (Patient not taking: Reported on 07/13/2022), Disp: 90 capsule, Rfl: 3 Allergies  Allergen Reactions   Adhesive [Tape] Other (See Comments)    After right leg fracture surgery, pt developed a large blister where tape was applied to his right leg. OK to use paper tape.   Imdur [Isosorbide Dinitrate] Other (See Comments)  hallucinations   Singulair [Montelukast Sodium] Other (See Comments)    Hallucinations      Social History   Socioeconomic History   Marital status: Single    Spouse name: Not on file   Number of children: 2   Years of education: Not on file   Highest education level: 6th grade  Occupational History   Not on file  Tobacco Use   Smoking status: Some Days    Packs/day: 0.50    Years: 52.00    Additional pack years: 0.00    Total pack years: 26.00    Types: Cigarettes   Smokeless tobacco: Never  Vaping Use   Vaping Use: Never used  Substance and Sexual Activity   Alcohol use: No    Alcohol/week: 0.0 standard drinks of alcohol    Comment: Stopped 2009   Drug use: No   Sexual activity: Not Currently  Other Topics Concern   Not on file  Social History Narrative   ** Merged History Encounter **       Social Determinants of Health   Financial Resource Strain: High Risk (04/12/2022)   Overall Financial Resource Strain (CARDIA)    Difficulty of Paying Living Expenses: Hard  Food Insecurity: No Food Insecurity (05/23/2022)   Hunger Vital Sign    Worried About Running Out of Food in the Last Year: Never true    Ran Out  of Food in the Last Year: Never true  Transportation Needs: No Transportation Needs (05/23/2022)   PRAPARE - Administrator, Civil Service (Medical): No    Lack of Transportation (Non-Medical): No  Physical Activity: Inactive (02/05/2017)   Exercise Vital Sign    Days of Exercise per Week: 0 days    Minutes of Exercise per Session: 0 min  Stress: Stress Concern Present (02/05/2017)   [employer] of Occupational Health - Occupational Stress Questionnaire    Feeling of Stress : Rather much  Social Connections: Moderately Isolated (02/05/2017)   Social Connection and Isolation Panel [NHANES]    Frequency of Communication with Friends and Family: More than three times a week    Frequency of Social Gatherings with Friends and Family: Once a week    Attends Religious Services: Never    Database administrator or Organizations: No    Attends Banker Meetings: Never    Marital Status: Divorced  Catering manager Violence: Not At Risk (04/14/2022)   Humiliation, Afraid, Rape, and Kick questionnaire    Fear of Current or Ex-Partner: No    Emotionally Abused: No    Physically Abused: No    Sexually Abused: No    Physical Exam      Future Appointments  Date Time Provider Department Center  07/14/2022 10:00 AM Otho Ket, RN [facility] None  07/20/2022 11:30 AM Raechel Chute, MD [facility] None  08/03/2022  2:20 PM Bensimhon, Bevelyn Buckles, MD [facility] None  09/01/2022 10:30 AM [name], NP [facility] None

## 2022-07-13 NOTE — Telephone Encounter (Signed)
Reached out to [facility] to check on refills when pharmacist Thayer Ohm advised [employer] is kicking back on copays and saying he is no longer covered. Thayer Ohm advised Narinder should call his case worker with medicaid to check on status of same and that his medicare plan states he is eligible for Part D. I made Laureano aware and told him to follow up with Thayer Ohm and his caseworker at [facility]- I am also forwarding to Rosetta Posner LCSW at [facility] clinic for assistance on follow up as I am out of office until 5/22. I will follow up on my return.   [name], EMT-Paramedic [phone] 07/13/2022

## 2022-07-14 ENCOUNTER — Telehealth: Payer: Self-pay | Admitting: Nurse Practitioner

## 2022-07-14 ENCOUNTER — Other Ambulatory Visit (HOSPITAL_COMMUNITY): Payer: Self-pay

## 2022-07-14 ENCOUNTER — Ambulatory Visit: Payer: Self-pay

## 2022-07-14 NOTE — Patient Outreach (Signed)
  Care Coordination   Follow Up Visit Note   07/14/2022 Name: Bruce [name] MRN: [id number] DOB: March 02, 1946  Bruce [name] is a 77 y.o. year old male who sees Bruce Eva, NP for primary care. I spoke with  Bruce [name] by phone today.  What matters to the patients health and wellness today?  Patient denies any increase in symptoms related to HF or A-fib.  Patient states he sprained his ankle approximately 1 week ago. He states with applying ice he has noticed some of the swelling has decrease but still has notable pain.  Patient states he has not heard from anyone regarding request for portable oxygen.  RNCM called primary care provider office and spoke with Bruce [name] who states cardiologist sent portable oxygen order request to [facility] on 06/15/22.  Telephone call to [facility]. Spoke with Bruce [name] who states order for portable oxygen was not received. .  Confirmed fax number with Bruce [name].  Call returned to primary care provider office and message left on nurse call line requesting portable oxygen order be refaxed to [facility].    Goals Addressed             This Visit's Progress    Post hospital follow up / management of health conditions       Interventions Today    Flowsheet Row Most Recent Value  Chronic Disease   Chronic disease during today's visit Chronic Obstructive Pulmonary Disease (COPD), Congestive Heart Failure (CHF), Other  [PE]  General Interventions   General Interventions Discussed/Reviewed General Interventions Reviewed, Doctor Visits  [evaluation of current treatment plan for COPD, HF, PE and patients adherence to plan as establisched by provider. Assessed for symptoms related to mentioned conditions.]  Doctor Visits Discussed/Reviewed Doctor Visits Reviewed  [facility] upcoming provider visits. Called PCP office to address patient need for portable Oxygen Message left. Called [facility] home health and spoke with [facility] to determine if referral for portable oxygen received.]   Exercise Interventions   Exercise Discussed/Reviewed Physical Activity  Physical Activity Discussed/Reviewed Physical Activity Reviewed  [assessed for ongoing home health PT services. Encouraged patient to continue instructed PT exercises.]  Education Interventions   Education Provided Provided Education  [reviewed signs of HF, COPD, PE. Advised to notify provider for any mild/ moderate symptoms/ new or going. Advised to call 911 for severe symptoms.]  Pharmacy Interventions   Pharmacy Dicussed/Reviewed Pharmacy Topics Reviewed  [Discussed compliance of medications. Patient has paramedicine services for medication management.]              SDOH assessments and interventions completed:  No{[facility] Tip this will not be part of the note when signed-REQUIRED REPORT FIELD DO NOT DELETE (Optional):27901}     Care Coordination Interventions:  Yes, provided {[facility]N Tip this will not be part of the note when signed-REQUIRED REPORT FIELD DO NOT DELETE (Optional):27901}  Follow up plan: Follow up call scheduled for ***   Encounter Outcome:  Pt. Visit Completed {[facility] Tip this will not be part of the note when signed-REQUIRED REPORT FIELD DO NOT DELETE (Optional):27901}  Bruce Ina RN,BSN,CCM [facility] Care Coordination [phone] direct line

## 2022-07-14 NOTE — Telephone Encounter (Signed)
Pietro Cassis, nurse case manager, left a VM stating that she spoke with [facility] at [facility] and they have not received the order that was sent on 4/11 for the portable oxygen. Please refax order to [phone].   Pietro Cassis [phone]

## 2022-07-17 NOTE — Telephone Encounter (Signed)
Faxed to [facility] 

## 2022-07-20 ENCOUNTER — Ambulatory Visit: Payer: Medicare Other | Admitting: Student in an Organized Health Care Education/Training Program

## 2022-07-20 ENCOUNTER — Telehealth (HOSPITAL_COMMUNITY): Payer: Self-pay | Admitting: Emergency Medicine

## 2022-07-20 ENCOUNTER — Encounter: Payer: Self-pay | Admitting: Student in an Organized Health Care Education/Training Program

## 2022-07-20 ENCOUNTER — Other Ambulatory Visit (HOSPITAL_COMMUNITY): Payer: Self-pay | Admitting: Emergency Medicine

## 2022-07-20 VITALS — BP 122/70 | HR 76 | Temp 97.6°F | Ht 73.0 in | Wt 165.8 lb

## 2022-07-20 DIAGNOSIS — I272 Pulmonary hypertension, unspecified: Secondary | ICD-10-CM | POA: Diagnosis not present

## 2022-07-20 DIAGNOSIS — J439 Emphysema, unspecified: Secondary | ICD-10-CM

## 2022-07-20 DIAGNOSIS — I2609 Other pulmonary embolism with acute cor pulmonale: Secondary | ICD-10-CM

## 2022-07-20 DIAGNOSIS — R918 Other nonspecific abnormal finding of lung field: Secondary | ICD-10-CM

## 2022-07-20 MED ORDER — STIOLTO RESPIMAT 2.5-2.5 MCG/ACT IN AERS
2.0000 | INHALATION_SPRAY | Freq: Every day | RESPIRATORY_TRACT | 12 refills | Status: DC
Start: 2022-07-20 — End: 2023-04-24

## 2022-07-20 NOTE — Telephone Encounter (Signed)
Spoke with [name] in attempts to reach Mr. Sonner.  Advised her that I had called him yesterday and had not heard back.  Advised her that I wanted to see him this morning and she advised she would tell him same.  I told her I would be there around 10:00.    Beatrix Shipper, EMT-Paramedic [phone] 07/20/2022

## 2022-07-20 NOTE — Progress Notes (Signed)
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 Patient was started on Symbicort by his primary care physician which he was previously on. I will optimize his COPD therapy by switching him to LABA/LAMA with stiolto. I will also obtain a pulmonary function test to assess the degree of obstruction, evaluate lung volumes, and assess DLCO.  Does have hypoxia and is requiring oxygen, chronic hypoxic respiratory failure secondary to a combination of heart failure, pulmonary hypertension and COPD. He did drop his sats on trending in the office and will require long term oxygen supplementation.  - Tiotropium Bromide-Olodaterol (STIOLTO RESPIMAT) 2.5-2.5 MCG/ACT AERS; Inhale 2 puffs into the lungs daily.  Dispense: 60 each; Refill: 12 - CT CHEST WO CONTRAST; Future - Pulmonary Function Test [facility] Only; Future  #Multiple pulmonary nodules  Noted to have a 6 mm ground glass pulmonary nodule on his chest CT from his recent admission. Repeat chest CT on 07/11/2022 showed numerous peripheral patchy pulmonary densities with evidence of cavitation, most likely secondary to pulmonary infarction following his recent submassive PE. There's also some mosaic attenuation on my review of the CT. The 6 mm ggo was stable. At this point I would recommend repeat chest CT in 3 months to monitor the evolution of said densities on CT.  -CT Chest without contrast in 3 months  #Pulmonary hypertension, unspecified (HCC) #Other acute pulmonary embolism with acute cor pulmonale (HCC)  Followed closely by advanced heart failure, with plan for repeat RHC and a V/Q scan to assess for CTEPH. Patient should remain on lifelong AC.  -continue Apixaban indefinitely   Return in about 4 months (around 11/05/2022).  I spent 30 minutes  caring for this patient today, including preparing to see the patient, obtaining a medical history , reviewing a separately obtained history, performing a medically appropriate examination and/or evaluation, counseling and educating the patient/family/caregiver, ordering medications, tests, or procedures, documenting clinical information in the electronic health record, and independently interpreting results (not separately reported/billed) and communicating results to the patient/family/caregiver  Raechel Chute, MD [facility] 07/20/2022 12:47 PM    End of visit medications:  Meds ordered this encounter  Medications   Tiotropium Bromide-Olodaterol (STIOLTO RESPIMAT) 2.5-2.5 MCG/ACT AERS    Sig: Inhale 2 puffs into the lungs daily.    Dispense:  60 each    Refill:  12     Current Outpatient Medications:    albuterol (PROVENTIL HFA;VENTOLIN HFA) 108 (90 Base) MCG/ACT inhaler, Inhale 2 puffs into the lungs every 6 (six) hours as needed for wheezing or shortness of breath., Disp: 1 Inhaler, Rfl: 2   amiodarone (PACERONE) 200 MG tablet, Take 0.5 tablets (100 mg total) by mouth daily., Disp: 30 tablet, Rfl: 11   apixaban (ELIQUIS) 5 MG TABS tablet, Take 1 tablet (5 mg total) by mouth 2 (two) times daily., Disp: 60 tablet, Rfl: 3   cetirizine (ZYRTEC) 10 MG tablet, Take 1 tablet (10 mg total) by mouth daily., Disp: 30 tablet, Rfl: 11   digoxin (LANOXIN) 0.125 MG tablet, Take 1 tablet (0.125 mg total) by mouth daily., Disp: 30 tablet, Rfl: 6   docusate sodium (COLACE) 100 MG capsule, Take 1 capsule (100 mg total) by mouth daily., Disp: 90 capsule, Rfl: 3   FARXIGA 10 MG TABS tablet, TAKE ONE TABLET BY MOUTH EVERY MORNING, Disp: 90 tablet,  Rfl: 0   fluticasone (FLONASE) 50 MCG/ACT nasal spray, Place 1 spray into both nostrils daily., Disp: , Rfl:    lidocaine (LIDODERM) 5 %, Place 1 patch onto the skin daily. Remove & Discard patch within 12 hours or as directed by MD (Patient  taking differently: Place 1 patch onto the skin daily. Remove & Discard patch within 12 hours or as directed by MD as needed), Disp: 30 patch, Rfl: 11   losartan (COZAAR) 25 MG tablet, Take 0.5 tablets (12.5 mg total) by mouth at bedtime., Disp: 45 tablet, Rfl: 1   Multiple Vitamin (MULTIVITAMIN) tablet, Take 1 tablet by mouth daily. For Men, Disp: , Rfl:    nitroGLYCERIN (NITROSTAT) 0.4 MG SL tablet, Place 0.4 mg under the tongue every 5 (five) minutes as needed for chest pain., Disp: , Rfl:    Omega-3 Fatty Acids (FISH OIL PO), Take 1,000 mg by mouth every evening., Disp: , Rfl:    omeprazole (PRILOSEC) 40 MG capsule, Take 40 mg by mouth daily., Disp: , Rfl:    ranolazine (RANEXA) 500 MG 12 hr tablet, Take 1 tablet (500 mg total) by mouth 2 (two) times daily., Disp: 60 tablet, Rfl: 6   rOPINIRole (REQUIP) 1 MG tablet, TAKE ONE TABLET BY MOUTH EVERY NIGHT AT BEDTIME FOR RESTLESS LEGS, Disp: 30 tablet, Rfl: 3   rosuvastatin (CRESTOR) 10 MG tablet, Take 1 tablet (10 mg total) by mouth daily., Disp: 30 tablet, Rfl: 6   sildenafil (REVATIO) 20 MG tablet, TAKE ONE TABLET (20 MG TOTAL) BY MOUTH THREE (THREE) TIMES DAILY., Disp: 90 tablet, Rfl: 0   Tiotropium Bromide-Olodaterol (STIOLTO RESPIMAT) 2.5-2.5 MCG/ACT AERS, Inhale 2 puffs into the lungs daily., Disp: 60 each, Rfl: 12   Subjective:   PATIENT ID: Bruce [name] GENDER: male DOB: Mar 14, 1945, MRN: [id number]  Chief Complaint  Patient presents with   pulmonary consult    SOB with exertion, prod cough with small amount of clear sputum and wheezing. CT 07/14/2022    HPI  Mr. Radach is a pleasant 77 year old male with a past medical history of COPD, CAD, and DVT as well as long standing history of smoking, who was admitted to the hospital in February of 2024 for shortness of breath and found to have a submassive PE.  Patient was admitted on 04/09/2022 for increased shortness of breath where he was found to have a submassive pulmonary embolus with  right heart strain for which he underwent thrombectomy. He was started on anti-coagulation, and an echocardiogram showed a drop in ejection fraction prompting an evaluation by cardiology and a right heart cath. (RHC with RA 4, RV 77/8, RPA 73/23 [39] LPA 85/24 [44], PCWP 16, CO/CI 4.3/2.2, PVR 5 wood). Following discharge, he's been followed by advanced heart failure and has been started on GDMT, continued on anti-coagulation with Apixaban, and recommended . He's also been started on Sildenafil for pulmonary hypertension, with plans for a V/Q scan and repeat right heart cath to assess for CTEPH.  He was also noted to have a small 6 mm ground glass nodule on chest CT that appears mildly increased compared to prior (from 4 mm to 6 mm). He was referred to pulmonology with repeat chest CT that was performed last week.  Today, he mainly reports dyspnea. He feels that he first started noticing the dyspnea around 1 year ago, and it increased. He is very short of breath with exertion. He does not have a cough, nor does he complain of sputum  production, wheezing or chest tightness. He is on Symbicort for COPD, which appears to have last been filled last month.  Patient previously worked in [employer] as well as in Administrator, sports and driving trucks. He has a significant smoking history (between 1 to 2 packs a day for over 50 years).  PMH: -Paroxysmal Afib -CAD -COPD -HTN -Prostate Ca -Hisstory of DVT  Ancillary information including prior medications, full medical/surgical/family/social histories, and PFTs (when available) are listed below and have been reviewed.   Review of Systems  Constitutional:  Positive for malaise/fatigue. Negative for chills, diaphoresis and fever.  Respiratory:  Positive for shortness of breath. Negative for hemoptysis, sputum production and wheezing.   Cardiovascular:  Negative for chest pain.     Objective:   Vitals:   07/20/22 1142  BP: 122/70  Pulse: 76  Temp: 97.6 F  (36.4 C)  TempSrc: Temporal  SpO2: 90%  Weight: 165 lb 12.8 oz (75.2 kg)  Height: 6\' 1"  (1.854 m)   90% on RA  Patient was trended on room air, his SpO2 dropped to 85%, improved on 3 liters of [place].  BMI Readings from Last 3 Encounters:  07/20/22 21.87 kg/m  07/13/22 21.37 kg/m  07/06/22 21.37 kg/m   Wt Readings from Last 3 Encounters:  07/20/22 165 lb 12.8 oz (75.2 kg)  07/13/22 162 lb (73.5 kg)  07/06/22 162 lb (73.5 kg)    Physical Exam Constitutional:      General: He is not in acute distress.    Appearance: Normal appearance. He is not ill-appearing.  HENT:     Head: Normocephalic.     Mouth/Throat:     Mouth: Mucous membranes are moist.  Cardiovascular:     Rate and Rhythm: Normal rate and regular rhythm.     Pulses: Normal pulses.     Heart sounds: Normal heart sounds.  Pulmonary:     Effort: Pulmonary effort is normal.     Breath sounds: Normal breath sounds.  Neurological:     General: No focal deficit present.     Mental Status: He is alert.     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   UTI (urinary tract infection)    frequent UTI     Family History  Problem Relation Age of Onset   Hypertension Mother    Prostate cancer Brother    Mental  illness Neg Hx      Past Surgical History:  Procedure Laterality Date   CARDIAC CATHETERIZATION     CHOLECYSTECTOMY     EXTERNAL FIXATION LEG Right 10/06/2013   Procedure: CLOSED REDUCTION RIGHT  TIBIAL PLATEAU FRACTURE, EXTERNAL FIXATION RIGHT LEG, PLACEMENT OF WOUND VAC;  Surgeon: Budd Palmer, MD;  Location: [facility] OR;  Service: Orthopedics;  Laterality: Right;   FEMORAL-POPLITEAL BYPASS GRAFT Right 10/06/2013   Procedure: RIGHT POPLITEAL-POPLITEAL ARTERY BYPASS GRAFT;  Surgeon: Larina Earthly, MD;  Location[facility];  Service: Vascular;  Laterality: Right;   FRACTURE SURGERY Right 2014   HARDWARE REMOVAL Right 12/02/2013   Procedure: REMOVAL EXTERNAL FIXATION RIGHT LEG ;  Surgeon: Budd Palmer, MD;  Location: [facility] OR;  Service: Orthopedics;  Laterality: Right;   HERNIA REPAIR Right 1990's   I & D EXTREMITY Right 10/09/2013   Procedure: IRRIGATION AND DEBRIDEMENT RIGHT LEG, CLOSURE  OF WOUNDS, PLACEMENT OF WOUND VAC ON EACH SIDE OF LEG;  Surgeon: Budd Palmer, MD;  Location: [facility] OR;  Service: Orthopedics;  Laterality: Right;   IR ANGIOGRAM PULMONARY RIGHT SELECTIVE  04/10/2022   IR ANGIOGRAM SELECTIVE EACH ADDITIONAL VESSEL  04/10/2022   IR NEPHROSTOMY PLACEMENT LEFT  06/19/2016   IR THROMBECT PRIM MECH INIT (INCLU) MOD SED  04/10/2022   IR US GUIDE VASC ACCESS RIGHT  04/10/2022   IVC filter  2009   placed @ [facility]/ Removed in 2010.   IVC Filter Removed     LEFT HEART CATH AND CORONARY ANGIOGRAPHY Right 10/11/2018   Procedure: LEFT HEART CATH AND CORONARY ANGIOGRAPHY;  Surgeon: Laurier Nancy, MD;  Location: [facility] INVASIVE CV LAB;  Service: Cardiovascular;  Laterality: Right;   ligament leg Left    NEPHROLITHOTOMY Left 06/19/2016   Procedure: NEPHROLITHOTOMY PERCUTANEOUS;  Surgeon: Vanna Scotland, MD;  Location: [facility] ORS;  Service: Urology;  Laterality: Left;   ORIF TIBIA FRACTURE Right 12/29/2014   Procedure: OPEN REDUCTION INTERNAL FIXATION (ORIF) RIGHT TIBIA FRACTURE, RIA VS ICBG;  Surgeon: Myrene Galas, MD;  Location: [facility] OR;  Service: Orthopedics;  Laterality: Right;   RADIOACTIVE SEED IMPLANT N/A 09/12/2016   Procedure: RADIOACTIVE SEED IMPLANT/BRACHYTHERAPY IMPLANT;  Surgeon: Vanna Scotland, MD;  Location: [facility] ORS;  Service: Urology;  Laterality: N/A;   RIGHT/LEFT HEART CATH AND CORONARY ANGIOGRAPHY N/A 04/13/2022   Procedure: RIGHT/LEFT HEART CATH AND CORONARY ANGIOGRAPHY;  Surgeon: Dolores Patty, MD;  Location: [facility];  Service: Cardiovascular;  Laterality: N/A;    Social History   Socioeconomic History   Marital status: Single    Spouse name: Not on file   Number of children: 2   Years of education: Not on file   Highest education level: 6th grade  Occupational History   Not on file  Tobacco Use   Smoking status: Former    Packs/day: 0.50    Years: 52.00    Additional pack years: 0.00    Total pack years: 26.00    Types: Cigarettes    Quit date: 07/05/2022    Years since quitting: 0.0   Smokeless tobacco: Never  Vaping Use   Vaping Use: Never used  Substance and Sexual Activity   Alcohol use: No    Alcohol/week: 0.0 standard drinks of alcohol    Comment: Stopped 2009   Drug use: No   Sexual activity: Not Currently  Other Topics Concern   Not on file  Social History Narrative   ** Merged History Encounter **       Social Determinants of Health   Financial Resource Strain: High Risk (04/12/2022)   Overall Financial Resource Strain (CARDIA)    Difficulty of Paying Living Expenses: Hard  Food Insecurity: No Food Insecurity (05/23/2022)  Hunger Vital Sign    Worried About Running Out of Food in the Last Year: Never true    Ran Out of Food in the Last Year: Never true  Transportation Needs: No Transportation Needs (05/23/2022)   PRAPARE - Administrator, Civil Service (Medical): No    Lack of Transportation (Non-Medical): No  Physical Activity: Inactive (02/05/2017)   Exercise Vital Sign    Days of Exercise per Week: 0 days     Minutes of Exercise per Session: 0 min  Stress: Stress Concern Present (02/05/2017)   [employer] of Occupational Health - Occupational Stress Questionnaire    Feeling of Stress : Rather much  Social Connections: Moderately Isolated (02/05/2017)   Social Connection and Isolation Panel [NHANES]    Frequency of Communication with Friends and Family: More than three times a week    Frequency of Social Gatherings with Friends and Family: Once a week    Attends Religious Services: Never    Database administrator or Organizations: No    Attends Banker Meetings: Never    Marital Status: Divorced  Catering manager Violence: Not At Risk (04/14/2022)   Humiliation, Afraid, Rape, and Kick questionnaire    Fear of Current or Ex-Partner: No    Emotionally Abused: No    Physically Abused: No    Sexually Abused: No     Allergies  Allergen Reactions   Adhesive [Tape] Other (See Comments)    After right leg fracture surgery, pt developed a large blister where tape was applied to his right leg. OK to use paper tape.   Imdur [Isosorbide Dinitrate] Other (See Comments)    hallucinations   Singulair [Montelukast Sodium] Other (See Comments)    Hallucinations      CBC    Component Value Date/Time   WBC 6.9 07/06/2022 1821   RBC 3.85 (L) 07/06/2022 1821   HGB 11.6 (L) 07/06/2022 1821   HCT 36.8 (L) 07/06/2022 1821   PLT 276 07/06/2022 1821   MCV 95.6 07/06/2022 1821   MCH 30.1 07/06/2022 1821   MCHC 31.5 07/06/2022 1821   RDW 15.8 (H) 07/06/2022 1821   LYMPHSABS 0.7 07/06/2022 1821   MONOABS 0.5 07/06/2022 1821   EOSABS 0.2 07/06/2022 1821   BASOSABS 0.0 07/06/2022 1821    Pulmonary Functions Testing Results:     No data to display          Outpatient Medications Prior to Visit  Medication Sig Dispense Refill   albuterol (PROVENTIL HFA;VENTOLIN HFA) 108 (90 Base) MCG/ACT inhaler Inhale 2 puffs into the lungs every 6 (six) hours as needed for wheezing or shortness  of breath. 1 Inhaler 2   amiodarone (PACERONE) 200 MG tablet Take 0.5 tablets (100 mg total) by mouth daily. 30 tablet 11   apixaban (ELIQUIS) 5 MG TABS tablet Take 1 tablet (5 mg total) by mouth 2 (two) times daily. 60 tablet 3   cetirizine (ZYRTEC) 10 MG tablet Take 1 tablet (10 mg total) by mouth daily. 30 tablet 11   digoxin (LANOXIN) 0.125 MG tablet Take 1 tablet (0.125 mg total) by mouth daily. 30 tablet 6   docusate sodium (COLACE) 100 MG capsule Take 1 capsule (100 mg total) by mouth daily. 90 capsule 3   FARXIGA 10 MG TABS tablet TAKE ONE TABLET BY MOUTH EVERY MORNING 90 tablet 0   fluticasone (FLONASE) 50 MCG/ACT nasal spray Place 1 spray into both nostrils daily.     lidocaine (LIDODERM) 5 %  Place 1 patch onto the skin daily. Remove & Discard patch within 12 hours or as directed by MD (Patient taking differently: Place 1 patch onto the skin daily. Remove & Discard patch within 12 hours or as directed by MD as needed) 30 patch 11   losartan (COZAAR) 25 MG tablet Take 0.5 tablets (12.5 mg total) by mouth at bedtime. 45 tablet 1   Multiple Vitamin (MULTIVITAMIN) tablet Take 1 tablet by mouth daily. For Men     nitroGLYCERIN (NITROSTAT) 0.4 MG SL tablet Place 0.4 mg under the tongue every 5 (five) minutes as needed for chest pain.     Omega-3 Fatty Acids (FISH OIL PO) Take 1,000 mg by mouth every evening.     omeprazole (PRILOSEC) 40 MG capsule Take 40 mg by mouth daily.     ranolazine (RANEXA) 500 MG 12 hr tablet Take 1 tablet (500 mg total) by mouth 2 (two) times daily. 60 tablet 6   rOPINIRole (REQUIP) 1 MG tablet TAKE ONE TABLET BY MOUTH EVERY NIGHT AT BEDTIME FOR RESTLESS LEGS 30 tablet 3   rosuvastatin (CRESTOR) 10 MG tablet Take 1 tablet (10 mg total) by mouth daily. 30 tablet 6   sildenafil (REVATIO) 20 MG tablet TAKE ONE TABLET (20 MG TOTAL) BY MOUTH THREE (THREE) TIMES DAILY. 90 tablet 0   budesonide-formoterol (SYMBICORT) 160-4.5 MCG/ACT inhaler Inhale 2 puffs into the lungs 2  (two) times daily. 10.2 g 3   No facility-administered medications prior to visit.

## 2022-07-20 NOTE — Progress Notes (Signed)
Paramedicine Encounter    Patient ID: Bruce [name], male    DOB: 01-22-1946, 77 y.o.   MRN: [id number]   Complaints NONE  Assessment A&O x 4, skin W&D w/ good color.  No significant edema noted. Lung sounds with mild expiratory wheezes bilat and ronchi throughout which he says is normal.  Compliance with meds Missed 2 noon doses of Sidenafil and had not taken Thurs a.m meds at time of visit.  Pill box filled x 1 week  Refills needed Farxiga add 1 tabl Sun-Wed when picks up prescription today.  Meds changes since last visit Added new inhaler Stiolto    Social changes NONE   BP 112/60 (BP Location: Left Arm, Patient Position: Sitting, Cuff Size: Normal)   Pulse 68   Resp 16   Wt 165 lb (74.8 kg)   SpO2 97%   BMI 21.77 kg/m  Weight yesterday-not taken Last visit weight-162lb  Home visit finds Bruce [name] on home O2 via cannula @ 3lpm.  Med box reconciled x 1 week.  He had an office visit with his Pulmonologist who added a new inhaler to his regimen (Stiolto).  He also has 2 other refills ready for pick up this afternoon Farxiga & Ranexa.  His friend [name] will assist him adding these this afternoon and she verbalizes she understands how to do same.  Bruce [name] denies chest pain or SOB.  He is able to speak in complete sentences and does not appear to be in any respiratory distress this day.  ACTION: Home visit completed  Bethanie Dicker [phone] 07/20/22  Patient Care Team: [name], NP as PCP - General (Nurse Practitioner) Laurier Nancy, MD as PCP - Cardiology (Cardiology)  Patient Active Problem List   Diagnosis Date Noted   Chronic systolic heart failure (HCC) 05/04/2022   Pulmonary hypertension, unspecified (HCC) 04/14/2022   Acute systolic HF (heart failure) (HCC) 04/13/2022   Pulmonary embolism (HCC) 04/09/2022   Acute respiratory failure with hypoxia (HCC) 04/09/2022   Lactic acidosis 04/09/2022   Hypokalemia 04/09/2022   COPD with  acute exacerbation (HCC) 05/03/2020   PAF (paroxysmal atrial fibrillation) (HCC)    Syncope 12/31/2018   Intracranial bleed (HCC) 12/31/2018   AKI (acute kidney injury) (HCC)    Frontal lobe contusion (HCC)    Multiple closed facial bone fractures (HCC)    NSTEMI (non-ST elevated myocardial infarction) (HCC) 10/10/2018   Recurrent syncope 04/28/2018   Malignant neoplasm of prostate (HCC) 07/13/2016   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   Visual hallucination 03/18/2015   Auditory hallucination 03/18/2015   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 01/01/2015   Tibial plateau fracture 12/29/2014   Limb ischemia 11/18/2013   Blunt trauma of lower leg 10/08/2013   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HCC) 10/08/2013   Acute blood loss anemia 10/08/2013   Anxiety disorder 10/08/2013   Dyslipidemia 10/08/2013   GERD (gastroesophageal reflux disease) 10/08/2013   History of MI (myocardial infarction) 10/08/2013   Anticoagulated 10/08/2013   Hypertension    Tibia/fibula fracture 10/06/2013   CAD (coronary artery disease) 08/16/2010   Edema 08/16/2010   COPD (chronic obstructive pulmonary disease) (HCC) 08/16/2010   DVT of leg (deep venous thrombosis) (HCC) 08/16/2010   Leg cramps 08/16/2010    Current Outpatient Medications:    albuterol (PROVENTIL HFA;VENTOLIN HFA) 108 (90 Base) MCG/ACT inhaler, Inhale 2 puffs into the lungs every 6 (six) hours as needed for wheezing or shortness of breath., Disp: 1 Inhaler, Rfl: 2  amiodarone (PACERONE) 200 MG tablet, Take 0.5 tablets (100 mg total) by mouth daily., Disp: 30 tablet, Rfl: 11   apixaban (ELIQUIS) 5 MG TABS tablet, Take 1 tablet (5 mg total) by mouth 2 (two) times daily., Disp: 60 tablet, Rfl: 3   cetirizine (ZYRTEC) 10 MG tablet, Take 1 tablet (10 mg total) by mouth daily., Disp: 30 tablet, Rfl: 11   digoxin (LANOXIN) 0.125 MG tablet, Take 1 tablet (0.125 mg total) by mouth daily., Disp: 30 tablet, Rfl: 6    docusate sodium (COLACE) 100 MG capsule, Take 1 capsule (100 mg total) by mouth daily., Disp: 90 capsule, Rfl: 3   FARXIGA 10 MG TABS tablet, TAKE ONE TABLET BY MOUTH EVERY MORNING, Disp: 90 tablet, Rfl: 0   fluticasone (FLONASE) 50 MCG/ACT nasal spray, Place 1 spray into both nostrils daily., Disp: , Rfl:    lidocaine (LIDODERM) 5 %, Place 1 patch onto the skin daily. Remove & Discard patch within 12 hours or as directed by MD (Patient taking differently: Place 1 patch onto the skin daily. Remove & Discard patch within 12 hours or as directed by MD as needed), Disp: 30 patch, Rfl: 11   losartan (COZAAR) 25 MG tablet, Take 0.5 tablets (12.5 mg total) by mouth at bedtime., Disp: 45 tablet, Rfl: 1   Multiple Vitamin (MULTIVITAMIN) tablet, Take 1 tablet by mouth daily. For Men, Disp: , Rfl:    nitroGLYCERIN (NITROSTAT) 0.4 MG SL tablet, Place 0.4 mg under the tongue every 5 (five) minutes as needed for chest pain., Disp: , Rfl:    Omega-3 Fatty Acids (FISH OIL PO), Take 1,000 mg by mouth every evening., Disp: , Rfl:    omeprazole (PRILOSEC) 40 MG capsule, Take 40 mg by mouth daily., Disp: , Rfl:    ranolazine (RANEXA) 500 MG 12 hr tablet, Take 1 tablet (500 mg total) by mouth 2 (two) times daily., Disp: 60 tablet, Rfl: 6   rOPINIRole (REQUIP) 1 MG tablet, TAKE ONE TABLET BY MOUTH EVERY NIGHT AT BEDTIME FOR RESTLESS LEGS, Disp: 30 tablet, Rfl: 3   rosuvastatin (CRESTOR) 10 MG tablet, Take 1 tablet (10 mg total) by mouth daily., Disp: 30 tablet, Rfl: 6   sildenafil (REVATIO) 20 MG tablet, TAKE ONE TABLET (20 MG TOTAL) BY MOUTH THREE (THREE) TIMES DAILY., Disp: 90 tablet, Rfl: 0   Tiotropium Bromide-Olodaterol (STIOLTO RESPIMAT) 2.5-2.5 MCG/ACT AERS, Inhale 2 puffs into the lungs daily., Disp: 60 each, Rfl: 12 Allergies  Allergen Reactions   Adhesive [Tape] Other (See Comments)    After right leg fracture surgery, pt developed a large blister where tape was applied to his right leg. OK to use paper tape.    Imdur [Isosorbide Dinitrate] Other (See Comments)    hallucinations   Singulair [Montelukast Sodium] Other (See Comments)    Hallucinations      Social History   Socioeconomic History   Marital status: Single    Spouse name: Not on file   Number of children: 2   Years of education: Not on file   Highest education level: 6th grade  Occupational History   Not on file  Tobacco Use   Smoking status: Former    Packs/day: 0.50    Years: 52.00    Additional pack years: 0.00    Total pack years: 26.00    Types: Cigarettes    Quit date: 07/05/2022    Years since quitting: 0.0   Smokeless tobacco: Never  Vaping Use   Vaping Use: Never used  Substance and Sexual Activity   Alcohol use: No    Alcohol/week: 0.0 standard drinks of alcohol    Comment: Stopped 2009   Drug use: No   Sexual activity: Not Currently  Other Topics Concern   Not on file  Social History Narrative   ** Merged History Encounter **       Social Determinants of Health   Financial Resource Strain: High Risk (04/12/2022)   Overall Financial Resource Strain (CARDIA)    Difficulty of Paying Living Expenses: Hard  Food Insecurity: No Food Insecurity (05/23/2022)   Hunger Vital Sign    Worried About Running Out of Food in the Last Year: Never true    Ran Out of Food in the Last Year: Never true  Transportation Needs: No Transportation Needs (05/23/2022)   PRAPARE - Administrator, Civil Service (Medical): No    Lack of Transportation (Non-Medical): No  Physical Activity: Inactive (02/05/2017)   Exercise Vital Sign    Days of Exercise per Week: 0 days    Minutes of Exercise per Session: 0 min  Stress: Stress Concern Present (02/05/2017)   [employer] of Occupational Health - Occupational Stress Questionnaire    Feeling of Stress : Rather much  Social Connections: Moderately Isolated (02/05/2017)   Social Connection and Isolation Panel [NHANES]    Frequency of Communication with Friends and  Family: More than three times a week    Frequency of Social Gatherings with Friends and Family: Once a week    Attends Religious Services: Never    Database administrator or Organizations: No    Attends Banker Meetings: Never    Marital Status: Divorced  Catering manager Violence: Not At Risk (04/14/2022)   Humiliation, Afraid, Rape, and Kick questionnaire    Fear of Current or Ex-Partner: No    Emotionally Abused: No    Physically Abused: No    Sexually Abused: No    Physical Exam      Future Appointments  Date Time Provider Department Center  07/24/2022 11:00 AM MC-NM 1 [facility]  07/24/2022 12:00 PM MC-DG PORT 21 MC[facility]DG [facility]  08/02/2022 10:30 AM Otho Ket, RN [facility] None  08/03/2022  2:20 PM Bensimhon, Bevelyn Buckles, MD [facility] None  09/01/2022 10:30 AM [name], NP [facility] None

## 2022-07-24 ENCOUNTER — Ambulatory Visit (HOSPITAL_COMMUNITY)
Admission: RE | Admit: 2022-07-24 | Discharge: 2022-07-24 | Disposition: A | Discharge status: Home / Self Care | Payer: Medicare Other | Source: Ambulatory Visit | Attending: Internal Medicine | Admitting: Internal Medicine

## 2022-07-24 ENCOUNTER — Other Ambulatory Visit (HOSPITAL_COMMUNITY): Payer: Self-pay | Admitting: *Deleted

## 2022-07-24 ENCOUNTER — Encounter (HOSPITAL_COMMUNITY)
Admission: RE | Admit: 2022-07-24 | Discharge: 2022-07-24 | Disposition: A | Discharge status: Home / Self Care | Payer: Medicare Other | Source: Ambulatory Visit | Attending: Internal Medicine | Admitting: Internal Medicine

## 2022-07-24 DIAGNOSIS — I5022 Chronic systolic (congestive) heart failure: Secondary | ICD-10-CM

## 2022-07-24 DIAGNOSIS — I2609 Other pulmonary embolism with acute cor pulmonale: Secondary | ICD-10-CM

## 2022-07-24 DIAGNOSIS — I2721 Secondary pulmonary arterial hypertension: Secondary | ICD-10-CM

## 2022-07-24 MED ORDER — TECHNETIUM TO 99M ALBUMIN AGGREGATED
3.8000 | Freq: Once | INTRAVENOUS | Status: AC | PRN
  Administered 2022-07-24: 3.8 via INTRAVENOUS

## 2022-07-24 MED ORDER — IOHEXOL 350 MG/ML SOLN
75.0000 mL | Freq: Once | INTRAVENOUS | Status: AC | PRN
  Administered 2022-07-24: 75 mL via INTRAVENOUS

## 2022-07-24 NOTE — Progress Notes (Signed)
Per DrBensimhon, radiology concerned for PE after vq scan, order for CTA placed

## 2022-07-27 ENCOUNTER — Other Ambulatory Visit (HOSPITAL_COMMUNITY): Payer: Self-pay

## 2022-07-28 NOTE — Progress Notes (Signed)
Paramedicine Encounter    Patient ID: Bruce [name], male    DOB: [birth year], 77 y.o.   MRN: [id number]   Complaints- short of breath, fatigue   Assessment- CAOx4, warm and dry, seated wearing his nasal cannula with 3LPM of O2. No lower leg swelling, lungs clear with a slight expiratory wheeze. Productive cough, continues to smoke daily.   Compliance with meds- Missed one noon and one evening dose.   Pill box filled- for one week   Refills needed- certrizine, requip, losartan   Meds changes since last visit- none     Social changes- none    Arrived for home visit for [facility] who reports to be feeling short of breath and tired. This is chronic for Bruce [name]- he smokes daily. He uses 3LPM of O2 via nasal cannula when in the home. He is scheduled to get his portable oxygen in the coming weeks. I obtained vitals and assessment as noted. I reviewed meds and confirmed same filling pill box for one week. Refills as noted called into [place] pharmacy.   He is having issues with his insurance at present as previously charted [employer] noted his insurance for medication coverage. Bruce [name] reports he worked it out and has spoke to [employer] and [employer] and has this resolved. I will be sure to provide him with appropriate contacts to ensure same and have Rosetta Posner LCSW follow up while we are in clinic next week.   We reviewed HF education with diet, med compliance and social concerns. He agreed with plans and will plan to meet in clinic in one week. Visit complete.   BP (!) 160/80   Pulse 75   Resp 18   Wt 174 lb (78.9 kg)   SpO2 95% Comment: 3LPM  BMI 22.96 kg/m  Weight yesterday-- didn't weigh Last visit weight-- 174lbs   Bruce [name], EMT-Paramedic [phone]  ACTION: Home visit completed    Patient Care Team: [name], NP as PCP - General (Nurse Practitioner) Laurier Nancy, MD as PCP - Cardiology (Cardiology)  Patient Active Problem List   Diagnosis  Date Noted   Chronic systolic heart failure (HCC) 05/04/2022   Pulmonary hypertension, unspecified (HCC) 04/14/2022   Acute systolic HF (heart failure) (HCC) 04/13/2022   Pulmonary embolism (HCC) 04/09/2022   Acute respiratory failure with hypoxia (HCC) 04/09/2022   Lactic acidosis 04/09/2022   Hypokalemia 04/09/2022   COPD with acute exacerbation (HCC) 05/03/2020   PAF (paroxysmal atrial fibrillation) (HCC)    Syncope 12/31/2018   Intracranial bleed (HCC) 12/31/2018   AKI (acute kidney injury) (HCC)    Frontal lobe contusion (HCC)    Multiple closed facial bone fractures (HCC)    NSTEMI (non-ST elevated myocardial infarction) (HCC) 10/10/2018   Recurrent syncope 04/28/2018   Malignant neoplasm of prostate (HCC) 07/13/2016   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   Visual hallucination 03/18/2015   Auditory hallucination 03/18/2015   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 01/01/2015   Tibial plateau fracture 12/29/2014   Limb ischemia 11/18/2013   Blunt trauma of lower leg 10/08/2013   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HCC) 10/08/2013   Acute blood loss anemia 10/08/2013   Anxiety disorder 10/08/2013   Dyslipidemia 10/08/2013   GERD (gastroesophageal reflux disease) 10/08/2013   History of MI (myocardial infarction) 10/08/2013   Anticoagulated 10/08/2013   Hypertension    Tibia/fibula fracture 10/06/2013   CAD (coronary artery disease) 08/16/2010   Edema 08/16/2010   COPD (chronic obstructive pulmonary disease) (HCC) 08/16/2010  DVT of leg (deep venous thrombosis) (HCC) 08/16/2010   Leg cramps 08/16/2010    Current Outpatient Medications:    albuterol (PROVENTIL HFA;VENTOLIN HFA) 108 (90 Base) MCG/ACT inhaler, Inhale 2 puffs into the lungs every 6 (six) hours as needed for wheezing or shortness of breath., Disp: 1 Inhaler, Rfl: 2   amiodarone (PACERONE) 200 MG tablet, Take 0.5 tablets (100 mg total) by mouth daily., Disp: 30 tablet, Rfl: 11    apixaban (ELIQUIS) 5 MG TABS tablet, Take 1 tablet (5 mg total) by mouth 2 (two) times daily., Disp: 60 tablet, Rfl: 3   cetirizine (ZYRTEC) 10 MG tablet, Take 1 tablet (10 mg total) by mouth daily., Disp: 30 tablet, Rfl: 11   digoxin (LANOXIN) 0.125 MG tablet, Take 1 tablet (0.125 mg total) by mouth daily., Disp: 30 tablet, Rfl: 6   FARXIGA 10 MG TABS tablet, TAKE ONE TABLET BY MOUTH EVERY MORNING, Disp: 90 tablet, Rfl: 0   fluticasone (FLONASE) 50 MCG/ACT nasal spray, Place 1 spray into both nostrils daily., Disp: , Rfl:    lidocaine (LIDODERM) 5 %, Place 1 patch onto the skin daily. Remove & Discard patch within 12 hours or as directed by MD (Patient taking differently: Place 1 patch onto the skin daily. Remove & Discard patch within 12 hours or as directed by MD as needed), Disp: 30 patch, Rfl: 11   losartan (COZAAR) 25 MG tablet, Take 0.5 tablets (12.5 mg total) by mouth at bedtime., Disp: 45 tablet, Rfl: 1   Multiple Vitamin (MULTIVITAMIN) tablet, Take 1 tablet by mouth daily. For Men, Disp: , Rfl:    nitroGLYCERIN (NITROSTAT) 0.4 MG SL tablet, Place 0.4 mg under the tongue every 5 (five) minutes as needed for chest pain., Disp: , Rfl:    Omega-3 Fatty Acids (FISH OIL PO), Take 1,000 mg by mouth every evening., Disp: , Rfl:    omeprazole (PRILOSEC) 40 MG capsule, Take 40 mg by mouth daily., Disp: , Rfl:    ranolazine (RANEXA) 500 MG 12 hr tablet, Take 1 tablet (500 mg total) by mouth 2 (two) times daily., Disp: 60 tablet, Rfl: 6   rOPINIRole (REQUIP) 1 MG tablet, TAKE ONE TABLET BY MOUTH EVERY NIGHT AT BEDTIME FOR RESTLESS LEGS, Disp: 30 tablet, Rfl: 3   rosuvastatin (CRESTOR) 10 MG tablet, Take 1 tablet (10 mg total) by mouth daily., Disp: 30 tablet, Rfl: 6   sildenafil (REVATIO) 20 MG tablet, TAKE ONE TABLET (20 MG TOTAL) BY MOUTH THREE (THREE) TIMES DAILY., Disp: 90 tablet, Rfl: 0   Tiotropium Bromide-Olodaterol (STIOLTO RESPIMAT) 2.5-2.5 MCG/ACT AERS, Inhale 2 puffs into the lungs daily.,  Disp: 60 each, Rfl: 12   docusate sodium (COLACE) 100 MG capsule, Take 1 capsule (100 mg total) by mouth daily. (Patient not taking: Reported on 07/28/2022), Disp: 90 capsule, Rfl: 3 Allergies  Allergen Reactions   Adhesive [Tape] Other (See Comments)    After right leg fracture surgery, pt developed a large blister where tape was applied to his right leg. OK to use paper tape.   Imdur [Isosorbide Dinitrate] Other (See Comments)    hallucinations   Singulair [Montelukast Sodium] Other (See Comments)    Hallucinations      Social History   Socioeconomic History   Marital status: Single    Spouse name: Not on file   Number of children: 2   Years of education: Not on file   Highest education level: 6th grade  Occupational History   Not on file  Tobacco Use  Smoking status: Former    Packs/day: 0.50    Years: 52.00    Additional pack years: 0.00    Total pack years: 26.00    Types: Cigarettes    Quit date: 07/05/2022    Years since quitting: 0.0   Smokeless tobacco: Never  Vaping Use   Vaping Use: Never used  Substance and Sexual Activity   Alcohol use: No    Alcohol/week: 0.0 standard drinks of alcohol    Comment: Stopped 2009   Drug use: No   Sexual activity: Not Currently  Other Topics Concern   Not on file  Social History Narrative   ** Merged History Encounter **       Social Determinants of Health   Financial Resource Strain: High Risk (04/12/2022)   Overall Financial Resource Strain (CARDIA)    Difficulty of Paying Living Expenses: Hard  Food Insecurity: No Food Insecurity (05/23/2022)   Hunger Vital Sign    Worried About Running Out of Food in the Last Year: Never true    Ran Out of Food in the Last Year: Never true  Transportation Needs: No Transportation Needs (05/23/2022)   PRAPARE - Administrator, Civil Service (Medical): No    Lack of Transportation (Non-Medical): No  Physical Activity: Inactive (02/05/2017)   Exercise Vital Sign    Days  of Exercise per Week: 0 days    Minutes of Exercise per Session: 0 min  Stress: Stress Concern Present (02/05/2017)   [employer] of Occupational Health - Occupational Stress Questionnaire    Feeling of Stress : Rather much  Social Connections: Moderately Isolated (02/05/2017)   Social Connection and Isolation Panel [NHANES]    Frequency of Communication with Friends and Family: More than three times a week    Frequency of Social Gatherings with Friends and Family: Once a week    Attends Religious Services: Never    Database administrator or Organizations: No    Attends Banker Meetings: Never    Marital Status: Divorced  Catering manager Violence: Not At Risk (04/14/2022)   Humiliation, Afraid, Rape, and Kick questionnaire    Fear of Current or Ex-Partner: No    Emotionally Abused: No    Physically Abused: No    Sexually Abused: No    Physical Exam      Future Appointments  Date Time Provider Department Center  08/02/2022 10:30 AM Otho Ket, RN [facility] None  08/03/2022  2:20 PM Bensimhon, Bevelyn Buckles, MD [facility] None  09/01/2022 10:30 AM [name], NP [facility] None

## 2022-08-02 ENCOUNTER — Ambulatory Visit: Payer: Self-pay

## 2022-08-02 NOTE — Patient Outreach (Signed)
  Care Coordination   08/02/2022 Name: [name] MRN: [id number] DOB: Apr 21, 1945   Care Coordination Outreach Attempts:  An unsuccessful telephone outreach was attempted for a scheduled appointment today. Unable to reach patient. HIPAA compliant voice message left with call back phone number.   Follow Up Plan:  Additional outreach attempts will be made to offer the patient care coordination information and services.   Encounter Outcome:  No Answer   Care Coordination Interventions:  No, not indicated    George Ina [facility] Care Coordination [phone] direct line

## 2022-08-03 ENCOUNTER — Ambulatory Visit (HOSPITAL_COMMUNITY)
Admission: RE | Admit: 2022-08-03 | Discharge: 2022-08-03 | Disposition: A | Discharge status: Home / Self Care | Payer: Medicare Other | Source: Ambulatory Visit | Attending: Internal Medicine | Admitting: Internal Medicine

## 2022-08-03 ENCOUNTER — Other Ambulatory Visit (HOSPITAL_COMMUNITY): Payer: Self-pay

## 2022-08-03 ENCOUNTER — Encounter (HOSPITAL_COMMUNITY): Payer: Self-pay | Admitting: Internal Medicine

## 2022-08-03 VITALS — BP 140/80 | HR 77 | Wt 160.0 lb

## 2022-08-03 DIAGNOSIS — Z86718 Personal history of other venous thrombosis and embolism: Secondary | ICD-10-CM | POA: Insufficient documentation

## 2022-08-03 DIAGNOSIS — I2510 Atherosclerotic heart disease of native coronary artery without angina pectoris: Secondary | ICD-10-CM | POA: Diagnosis not present

## 2022-08-03 DIAGNOSIS — Z79899 Other long term (current) drug therapy: Secondary | ICD-10-CM | POA: Insufficient documentation

## 2022-08-03 DIAGNOSIS — Z5986 Financial insecurity: Secondary | ICD-10-CM | POA: Diagnosis not present

## 2022-08-03 DIAGNOSIS — I480 Paroxysmal atrial fibrillation: Secondary | ICD-10-CM | POA: Diagnosis not present

## 2022-08-03 DIAGNOSIS — F17210 Nicotine dependence, cigarettes, uncomplicated: Secondary | ICD-10-CM | POA: Diagnosis not present

## 2022-08-03 DIAGNOSIS — Z8546 Personal history of malignant neoplasm of prostate: Secondary | ICD-10-CM | POA: Insufficient documentation

## 2022-08-03 DIAGNOSIS — Z9981 Dependence on supplemental oxygen: Secondary | ICD-10-CM | POA: Diagnosis not present

## 2022-08-03 DIAGNOSIS — I739 Peripheral vascular disease, unspecified: Secondary | ICD-10-CM | POA: Diagnosis not present

## 2022-08-03 DIAGNOSIS — Z7901 Long term (current) use of anticoagulants: Secondary | ICD-10-CM | POA: Diagnosis not present

## 2022-08-03 DIAGNOSIS — I2782 Chronic pulmonary embolism: Secondary | ICD-10-CM

## 2022-08-03 DIAGNOSIS — I2721 Secondary pulmonary arterial hypertension: Secondary | ICD-10-CM | POA: Diagnosis not present

## 2022-08-03 DIAGNOSIS — I272 Pulmonary hypertension, unspecified: Secondary | ICD-10-CM

## 2022-08-03 DIAGNOSIS — C189 Malignant neoplasm of colon, unspecified: Secondary | ICD-10-CM | POA: Diagnosis not present

## 2022-08-03 DIAGNOSIS — Z7984 Long term (current) use of oral hypoglycemic drugs: Secondary | ICD-10-CM | POA: Diagnosis not present

## 2022-08-03 DIAGNOSIS — J9611 Chronic respiratory failure with hypoxia: Secondary | ICD-10-CM | POA: Diagnosis not present

## 2022-08-03 DIAGNOSIS — I428 Other cardiomyopathies: Secondary | ICD-10-CM | POA: Insufficient documentation

## 2022-08-03 DIAGNOSIS — I5022 Chronic systolic (congestive) heart failure: Secondary | ICD-10-CM

## 2022-08-03 DIAGNOSIS — Z85118 Personal history of other malignant neoplasm of bronchus and lung: Secondary | ICD-10-CM | POA: Insufficient documentation

## 2022-08-03 DIAGNOSIS — I110 Hypertensive heart disease with heart failure: Secondary | ICD-10-CM | POA: Diagnosis not present

## 2022-08-03 DIAGNOSIS — J449 Chronic obstructive pulmonary disease, unspecified: Secondary | ICD-10-CM | POA: Diagnosis not present

## 2022-08-03 MED ORDER — LOSARTAN POTASSIUM 25 MG PO TABS: 25.0000 mg | ORAL_TABLET | Freq: Every day | ORAL | 1 refills | Status: DC

## 2022-08-03 NOTE — Progress Notes (Signed)
H&V Care Navigation CSW Progress Note  Clinical Social Worker consulted to speak with pt about insurance concerns.  Pt had letter that his Medicaid would switch to [facility] on July 1st.  Pt reports income is only $1,100 and no reserves so should still qualify according to pt report.  CSW attempted to call [facility] caseworker who sent the letter and left her a message to discuss.  Because pt no longer qualifies for Medicaid he has lost [facility] Dual Complete so now only has Medicaid until 7/1 and Medicare A&B.   If pt Medicaid and therefore [employer] Dual complete cannot be reinstating then he will need to speak with [facility] worker to apply for medicare advantage plan or part D plan.  Pt reports that no U card has made it hard to get food.  Provided him with list of food pantries, referral to [employer], and H&V food bag.   SDOH Screenings   Food Insecurity: Food Insecurity Present (08/03/2022)  Housing: High Risk (05/23/2022)  Transportation Needs: No Transportation Needs (05/23/2022)  Utilities: Not At Risk (04/14/2022)  Financial Resource Strain: High Risk (04/12/2022)  Physical Activity: Inactive (02/05/2017)  Social Connections: Moderately Isolated (02/05/2017)  Stress: Stress Concern Present (02/05/2017)  Tobacco Use: Medium Risk (08/03/2022)   Burna Sis, LCSW Clinical Social Worker [facility] Desk#: [phone] Cell#: [phone]

## 2022-08-03 NOTE — Progress Notes (Signed)
ADVANCED HF CLINIC NOTE  PCP: [name] NP Primary Cardiologist: Dr Gala Romney  Radiation Oncology: Dr Rushie Chestnut   HPI:  Bruce [name] is a 77 y/o with COPD and ongoing tobacco use, CAD, systolic HF EF 40-45%, colon CA previous lung CA, prostate cancer, PE, DVT, and HFrEF.   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 Missed several doses of xarelto prior to admit. Also had a/c LE DVT.  EF also found to be down to 20-25%. RV severely reduced. Cath minimal CAD, moderate to severe PAH with evidence of cor pulmonale (LPAP > RPAP - checked multiple times). GDMT limited by BP. Entresto lowered BP to much so he was switched to losartan. Discharged 04/18/22 to SNF. Discharge to home 05/05/22.   He was seen in the HF clinic 05/25/22. Cleda Daub was cut back 12.5 mg at [facility]. Spironolactone was later stopped due to hyperkalemia.   I saw him in 4/24 and POCUS with severe RV dysfunction and LVEF 40% (back to baseline). My suspicion was for severe underlying PAH/cor pulmonale due to end-stage COPD. PFTs and VQ ordered  VQ 5/24: Multiple peripheral perfusion defects in both lungs, indeterminate for recurrent acute pulmonary embolism -> CT scan recommended  CT chest 5/24: Severe COPD. The component of thrombus in the right main PA has essentially resolved since the prior CTA with a small amount of anterior chronic mural thrombus remaining. Distally, occlusive chronic thrombus remains in the interlobar PA extending into RLL and RML pulmonary arteries. This does not have the appearance of acute thrombus and appears to be chronic obstructive thrombus. At the time of thrombectomy, there was clearly a component of significant chronic thrombus in the interlobar artery  Saw Dr. Aundria Rud this months and PFTs ordered. SOB with minimal exertion. Gets lightheaded when standing. Minimal LE edema. Cut cigs back to 2 cigs/day. No CP.   Today he returns  for HF follow up with his wife and Paramedicine. Says he is doing ok. Wears 3Lo2 at home but doesn't have portable O2 tank (getting it on 6/5). Can get really SOB at times. BP running high but has been missing meds. + pain in legs at night and with walking. 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    RHC 04/13/2022 Ao = 122/78 (96) LV = 123/6 RA = 4 RV = 77/8 LPA = 73/23 (39) RPA = 85/24 (44)  PCW = 16 Fick cardiac output/index = 4.3/2.2 PVR = 5.0  Ao sat = 90% PA sat = 51%, 50%  Echo 04/2022   1. Left ventricular ejection fraction, by estimation, is 20 to 25%. The  left ventricle has severely decreased function. The left ventricle  demonstrates global hypokinesis. The left ventricular internal cavity size  was mildly dilated. Left ventricular  diastolic parameters are consistent with Grade I diastolic dysfunction  (impaired relaxation).   2. Mildly D-shaped interventricular septum suggests RV pressure/volume  overload. Right ventricular systolic function is severely reduced. The  right ventricular size is moderately enlarged. Tricuspid regurgitation  signal is inadequate for assessing PA  pressure.   3. The mitral valve is normal in structure. No evidence of mitral valve  regurgitation. No evidence of mitral stenosis.   4. The tricuspid valve is abnormal. Tricuspid valve regurgitation is mild  to moderate.   5.  The aortic valve is tricuspid. There is mild calcification of the  aortic valve. Aortic valve regurgitation is not visualized. No aortic  stenosis is present.  ROS: All systems negative except as listed in HPI, PMH and Problem List.  SH:  Social History   Socioeconomic History   Marital status: Single    Spouse name: Not on file   Number of children: 2   Years of education: Not on file   Highest education level: 6th grade  Occupational  History   Not on file  Tobacco Use   Smoking status: Former    Packs/day: 0.50    Years: 52.00    Additional pack years: 0.00    Total pack years: 26.00    Types: Cigarettes    Quit date: 07/05/2022    Years since quitting: 0.0   Smokeless tobacco: Never  Vaping Use   Vaping Use: Never used  Substance and Sexual Activity   Alcohol use: No    Alcohol/week: 0.0 standard drinks of alcohol    Comment: Stopped 2009   Drug use: No   Sexual activity: Not Currently  Other Topics Concern   Not on file  Social History Narrative   ** Merged History Encounter **       Social Determinants of Health   Financial Resource Strain: High Risk (04/12/2022)   Overall Financial Resource Strain (CARDIA)    Difficulty of Paying Living Expenses: Hard  Food Insecurity: No Food Insecurity (05/23/2022)   Hunger Vital Sign    Worried About Running Out of Food in the Last Year: Never true    Ran Out of Food in the Last Year: Never true  Transportation Needs: No Transportation Needs (05/23/2022)   PRAPARE - Administrator, Civil Service (Medical): No    Lack of Transportation (Non-Medical): No  Physical Activity: Inactive (02/05/2017)   Exercise Vital Sign    Days of Exercise per Week: 0 days    Minutes of Exercise per Session: 0 min  Stress: Stress Concern Present (02/05/2017)   [employer] of Occupational Health - Occupational Stress Questionnaire    Feeling of Stress : Rather much  Social Connections: Moderately Isolated (02/05/2017)   Social Connection and Isolation Panel [NHANES]    Frequency of Communication with Friends and Family: More than three times a week    Frequency of Social Gatherings with Friends and Family: Once a week    Attends Religious Services: Never    Database administrator or Organizations: No    Attends Banker Meetings: Never    Marital Status: Divorced  Catering manager Violence: Not At Risk (04/14/2022)   Humiliation, Afraid, Rape, and Kick  questionnaire    Fear of Current or Ex-Partner: No    Emotionally Abused: No    Physically Abused: No    Sexually Abused: No    FH:  Family History  Problem Relation Age of Onset   Hypertension Mother    Prostate cancer Brother    Mental illness Neg Hx     Past Medical History:  Diagnosis Date   Anginal pain (HCC)    Left side if chest ,NTG  relieves chaes apin 12/01/13   Anxiety    Arthritis    Cancer (HCC)    colon cancer   CHF (congestive heart failure) (HCC)    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)    '09 AND '12   Peripheral vascular disease (HCC)    Shortness of breath    With exertion .   UTI (urinary tract infection)    frequent UTI    Current Outpatient Medications  Medication Sig Dispense Refill   albuterol (PROVENTIL HFA;VENTOLIN HFA) 108 (90 Base) MCG/ACT inhaler Inhale 2 puffs into the lungs every 6 (six) hours as needed for wheezing or shortness of breath. 1 Inhaler 2   amiodarone (PACERONE) 200 MG tablet Take 0.5 tablets (100 mg total) by mouth daily. 30 tablet 11   apixaban (ELIQUIS) 5 MG TABS tablet Take 1 tablet (5 mg total) by mouth 2 (two) times daily. 60 tablet 3   cetirizine (ZYRTEC) 10 MG tablet Take 1 tablet (10 mg total) by mouth daily. 30 tablet 11   digoxin (LANOXIN) 0.125 MG tablet Take 1 tablet (0.125 mg total) by mouth daily. 30 tablet 6   docusate sodium (COLACE) 100 MG capsule Take 1 capsule (100 mg total) by mouth daily. 90 capsule 3   FARXIGA 10 MG TABS tablet TAKE ONE TABLET BY MOUTH EVERY MORNING 90 tablet 0   fluticasone (FLONASE) 50 MCG/ACT nasal spray Place 1  spray into both nostrils daily.     lidocaine (LIDODERM) 5 % Place 1 patch onto the skin daily. Remove & Discard patch within 12 hours or as directed by MD 30 patch 11   losartan (COZAAR) 25 MG tablet Take 0.5 tablets (12.5 mg total) by mouth at bedtime. 45 tablet 1   Multiple Vitamin (MULTIVITAMIN) tablet Take 1 tablet by mouth daily. For Men     nitroGLYCERIN (NITROSTAT) 0.4 MG SL tablet Place 0.4 mg under the tongue every 5 (five) minutes as needed for chest pain.     Omega-3 Fatty Acids (FISH OIL PO) Take 1,000 mg by mouth every evening.     omeprazole (PRILOSEC) 40 MG capsule Take 40 mg by mouth daily.     ranolazine (RANEXA) 500 MG 12 hr tablet Take 1 tablet (500 mg total) by mouth 2 (two) times daily. 60 tablet 6   rOPINIRole (REQUIP) 1 MG tablet TAKE ONE TABLET BY MOUTH EVERY NIGHT AT BEDTIME FOR RESTLESS LEGS 30 tablet 3   rosuvastatin (CRESTOR) 10 MG tablet Take 1 tablet (10 mg total) by mouth daily. 30 tablet 6   sildenafil (REVATIO) 20 MG tablet TAKE ONE TABLET (20 MG TOTAL) BY MOUTH THREE (THREE) TIMES DAILY. 90 tablet 0   Tiotropium Bromide-Olodaterol (STIOLTO RESPIMAT) 2.5-2.5 MCG/ACT AERS Inhale 2 puffs into the lungs daily. 60 each 12   No current facility-administered medications for this encounter.    Vitals:   08/03/22 1420  BP: (!) 140/80  Pulse: 77  SpO2: 96%  Weight: 72.6 kg (160 lb)  Sitting SBP 118  Standing 108   Wt Readings from Last 3 Encounters:  08/03/22 72.6 kg (160 lb)  07/27/22 78.9 kg (174 lb)  07/20/22 74.8 kg (165 lb)    PHYSICAL EXAM: General:  thin chronically ill appearing WM, in WC . Using Helix HEENT: normal Neck: supple. JVP 6-7 Carotids 2+ bilat; no bruits. No lymphadenopathy or thryomegaly appreciated. Cor: PMI nondisplaced. Regular rate &  rhythm. No rubs, gallops or murmurs. Lungs: decreased throughout Abdomen: soft, nontender, nondistended. No hepatosplenomegaly. No bruits or masses. Good bowel sounds. Extremities: no cyanosis, clubbing,  rash, edema Neuro: alert & orientedx3, cranial nerves grossly intact. moves all 4 extremities w/o difficulty. Affect pleasant   ECG:NSR 70 bpm personally checked.    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 PVR 5.  - Continue Eliquis 5 bid - VQ 5/24: Multiple peripheral perfusion defects in both lungs, indeterminate for recurrent acute pulmonary embolism -> CT scan recommended - CT chest 5/24: Severe COPD. The component of thrombus in the right main PA has essentially resolved since the prior CTA with a small amount of anterior chronic mural thrombus remaining. Distally, occlusive chronic thrombus remains in the interlobar PA extending into RLL and RML pulmonary arteries - No role for thrombectomy - PFTs pending   2. Chronic Hypoxic Respiratory Failure - COPD at b/l + submassive PE - Continue oxygen 3 liters [place]> Sats stable.  - Follows with Dr. Birdena Jubilee in Pulmonary. PFTs pending - Refer Pulmonary rehab  3. PAF  - Now in NSR - Continue amio 200 mg daily.  Not ideal with COPD.  - Continue Eliquis 5 mg twice a day.  - Check TSH free T3 Free T4.    4. Chronic Biventricular HFrEF  - 04/10/22 Echo EF down 20-25% with D shaped septum RV Failure, EF previously 40-45%.  - R/LHC c/w severe NICM. Minimal CAD, moderate to severe PAH with evidence of cor pulmonale = POCUS- EF 40% RV remains dilated and reduced.  - NYHA III. Volume status low. Reds 27%.  - Continue Farxiga 10 mg daily  - Off  Spiro due to elevated K.  - In crease losartan to 25 mg QHS.  - Continue Digoxin 0.125 and digoxin level 0.3  - Repeat ECHO - C/w paramedicine. Appreciate their assistance     - Continue to follow with [facility]    6. Tobacco abuse - Smoking 2-3 cigarettes.  - Discussed cessation   7. Prostate Cancer Treated with seeds   8. LE  claudication - no palpable distal pulses - check ABIs  Total time spent 40 minutes. Over half that time spent discussing above.   Arvilla Meres MD 2:33 PM

## 2022-08-03 NOTE — Progress Notes (Signed)
Paramedicine Encounter  Patient ID: Bruce [name], male, DOB: 01-13-1946, 77 y.o.,  MRN: [id number]  Met patient in clinic today with Dr. Gala Romney and Reita Cliche reports feeling short of breath, fatigued and having some hip to foot pain in both legs. He is in office without oxygen today- he is planning to pick up his portable O2 on Wednesday. Dr. Gala Romney reccommended Pulmonary Rehab, ABI's and continuing to get PFT's (ordered by PULM) He also ordered Losartan increase to 25mg . I reviewed meds and filled pill box for one week. No refills needed at present. Bruce [name] reports new inhaler from lung doctor seems strong as he used it for the first time today and says immediately he had relief. I plan to follow up next week and will see if ABI's, PFT's and Pulm Rehab have been scheduled and ensure his portable O2 was obtained. Bruce [name] agreed with plan.   [place] assisted today with deciphering some insurance issues. We will follow up with her once she speaks to [facility] caseworker. He may need to re-apply for adult medicaid.     Weight @ clinic-160lbs B/P-140/80 P-77  SP02-96% (no O2)  RR- 16    Med changes: LOSARTAN INCREASED TO 25MG     [name], EMT-Paramedic [phone] 08/03/2022

## 2022-08-03 NOTE — Patient Instructions (Signed)
Medication Changes:  Increase Losartan to 25 mg Daily  Lab Work:  none  Testing/Procedures:  Your physician has requested that you have an ankle brachial index (ABI). During this test an ultrasound and blood pressure cuff are used to evaluate the arteries that supply the arms and legs with blood. Allow thirty minutes for this exam. There are no restrictions or special instructions. YOU WILL BE CALLED TO SCHEDULE THIS  Referrals:  You have been referred to Pulmonary Rehab at [facility], they will call you to schedule  Special Instructions // Education:  Do the following things EVERYDAY: Weigh yourself in the morning before breakfast. Write it down and keep it in a log. Take your medicines as prescribed Eat low salt foods--Limit salt (sodium) to 2000 mg per day.  Stay as active as you can everyday Limit all fluids for the day to less than 2 liters   Follow-Up in: 6 months with an echocardiogram  At the [facility], you and your health needs are our priority. We have a designated team specialized in the treatment of Heart Failure. This Care Team includes your primary Heart Failure Specialized Cardiologist (physician), Advanced Practice Providers (APPs- Physician Assistants and Nurse Practitioners), and Pharmacist who all work together to provide you with the care you need, when you need it.   You may see any of the following providers on your designated Care Team at your next follow up:  Dr. Arvilla Meres Dr. Marca Ancona Dr. Marcos Eke, NP Robbie Lis, [place] [facility] [place], [place] Brynda Peon, NP Karle Plumber, PharmD   Please be sure to bring in all your medications bottles to every appointment.   Need to Contact us:  If you have any questions or concerns before your next appointment please send [place] a message through [place] or call our office at [phone].    TO LEAVE A MESSAGE FOR THE NURSE SELECT OPTION 2, PLEASE LEAVE  A MESSAGE INCLUDING: YOUR NAME DATE OF BIRTH CALL BACK NUMBER REASON FOR CALL**this is important as we prioritize the call backs  YOU WILL RECEIVE A CALL BACK THE SAME DAY AS LONG AS YOU CALL BEFORE 4:00 PM

## 2022-08-05 DIAGNOSIS — I2699 Other pulmonary embolism without acute cor pulmonale: Secondary | ICD-10-CM | POA: Diagnosis not present

## 2022-08-05 DIAGNOSIS — I2510 Atherosclerotic heart disease of native coronary artery without angina pectoris: Secondary | ICD-10-CM | POA: Diagnosis not present

## 2022-08-05 DIAGNOSIS — I4891 Unspecified atrial fibrillation: Secondary | ICD-10-CM | POA: Diagnosis not present

## 2022-08-05 DIAGNOSIS — J449 Chronic obstructive pulmonary disease, unspecified: Secondary | ICD-10-CM | POA: Diagnosis not present

## 2022-08-05 DIAGNOSIS — J9601 Acute respiratory failure with hypoxia: Secondary | ICD-10-CM | POA: Diagnosis not present

## 2022-08-07 ENCOUNTER — Other Ambulatory Visit (HOSPITAL_COMMUNITY): Payer: Self-pay | Admitting: Internal Medicine

## 2022-08-07 DIAGNOSIS — I739 Peripheral vascular disease, unspecified: Secondary | ICD-10-CM

## 2022-08-10 ENCOUNTER — Telehealth (HOSPITAL_COMMUNITY): Payer: Self-pay

## 2022-08-10 NOTE — Telephone Encounter (Signed)
Attempted X3 to reach [place] for home visit today. Unable to leave a message. I will continue to reach out for scheduling home paramedicine visit.   [name], EMT-Paramedic [phone] 08/10/2022

## 2022-08-14 ENCOUNTER — Telehealth (HOSPITAL_COMMUNITY): Payer: Self-pay

## 2022-08-14 NOTE — Telephone Encounter (Signed)
Attempted to call [place] using his cell number , which someone answered and said it was not [place] number and his friend [name] with no success. I will continue to reach him.   [name], EMT-Paramedic [phone] 08/14/2022

## 2022-08-22 ENCOUNTER — Telehealth (HOSPITAL_COMMUNITY): Payer: Self-pay

## 2022-08-22 NOTE — Telephone Encounter (Signed)
Called several times over the last few weeks to reach Bruce [name] with no success. His phone is not working and his friends phone Bruce [name] who he lives with is going to [employer]- unsure how else to reach him at present. I will forward to [employer] for assistance.   [name], EMT-Paramedic [phone] 08/22/2022

## 2022-08-23 ENCOUNTER — Ambulatory Visit (HOSPITAL_COMMUNITY): Payer: Medicare Other | Attending: Internal Medicine

## 2022-08-23 ENCOUNTER — Telehealth (HOSPITAL_COMMUNITY): Payer: Self-pay

## 2022-08-23 NOTE — Telephone Encounter (Signed)
Bruce [name] returned my call on [name] phone and we agreed for a home visit for Monday at 0900. I also documented his cell phone just in case we can't get [name]. He says sometimes his cell phone works and sometimes it doesn't.  [phone]    He also reports he went to [facility] in [name] to get his oxygen and they preformed a walk test and advised him he didn't qualify for portable O2. I will forward message to providers.   [name], EMT-Paramedic [phone] 08/23/2022

## 2022-08-23 NOTE — Telephone Encounter (Signed)
Bruce [name] was able to connect myself and [name]- [name]'s roommate [name] admitted she blocked my number by accident. She reports [name] is not home at the moment but she will have him call me once he gets home. She re-saved my number and confirmed she will not block it going forward.   [name], EMT-Paramedic [phone] 08/23/2022

## 2022-08-24 ENCOUNTER — Telehealth: Payer: Self-pay

## 2022-08-24 NOTE — Telephone Encounter (Signed)
-----   Message from Raechel Chute, MD sent at 08/24/2022  9:43 AM EDT ----- Regarding: RE: Portable Oxygen We trended him in clinic and his saturation dropped on room air very quickly. I don't understand why they wouldn't provide him with oxygen. I will add Joselinne Lawal our CMA to help out.  Thanks for reaching out! Bruce [name]  ----- Message ----- From: [name], Paramedic Sent: 08/23/2022   5:35 PM EDT To: Raechel Chute, MD Subject: Portable Oxygen                                Hello Dr. Aundria Rud, I am a community paramedic for Dr. Gala Romney and see Husani Helfer in the community- he advised today that he went to [facility] to get his portable oxygen and they denied him after completing a walk test. He reports he has done a walk test at your office and you signed off on portable O2. I am trying to assist in getting him what he needs- I have witnessed him out in the community walking around without the oxygen and his sat's have dropped to 79% when he is not on his home concentrator. Please advise how I can further assist. Thanks!  [name], EMT-Paramedic [phone] 08/23/2022

## 2022-08-24 NOTE — Telephone Encounter (Signed)
Lm for Elease Hashimoto with [employer].

## 2022-08-25 ENCOUNTER — Other Ambulatory Visit (HOSPITAL_COMMUNITY): Payer: Self-pay | Admitting: Adult Health

## 2022-08-25 ENCOUNTER — Other Ambulatory Visit (HOSPITAL_COMMUNITY): Payer: Self-pay | Admitting: Cardiology

## 2022-08-25 MED ORDER — SILDENAFIL CITRATE 20 MG PO TABS: 20.0000 mg | ORAL_TABLET | Freq: Three times a day (TID) | ORAL | 6 refills | Status: DC

## 2022-08-25 MED ORDER — AMIODARONE HCL 200 MG PO TABS: 100.0000 mg | ORAL_TABLET | Freq: Every day | ORAL | 11 refills | Status: DC

## 2022-08-25 NOTE — Telephone Encounter (Signed)
I spoke with Bruce [name] from [employer], the patient was scheduled to come into the office on 08/09/2022 to qualify him for a POC. He did not show up for that appointment.  I attempted to contact the patient to let him know to reach out to [facility] to reschedule this appt. I did not get an answer and his voicemail box was full. I will try again later.

## 2022-08-25 NOTE — Telephone Encounter (Signed)
[facility] the patient x2. I left a message on his home phone to call our office back.

## 2022-08-25 NOTE — Telephone Encounter (Signed)
Lm x2 for Bruce [name] with [employer].

## 2022-08-28 ENCOUNTER — Other Ambulatory Visit (HOSPITAL_COMMUNITY): Payer: Self-pay

## 2022-08-28 NOTE — Telephone Encounter (Signed)
Noted  

## 2022-08-28 NOTE — Progress Notes (Signed)
Paramedicine Encounter    Patient ID: Bruce [name], male    DOB: 09-14-1945, 77 y.o.   MRN: [id number]   Complaints- Pt reports chest pain episode yesterday at 1800 resolved with NTG at 1930.   Assessment- A&O, skin warm and dry, seated in the home on the couch, no active shortness of breath or chest pain. No lower leg swelling, lungs clear.   Compliance with meds- intermittently taking them- has been unable to reach w/ paramedicine in several weeks.   Pill box filled- for one week   Refills needed- none   Meds changes since last visit- none   Med concerns- Sidenafil is now not covered by insurance.     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    Arrived for home visit for [facility] who reports that last night he had an episode of chest pain while driving home from a friends house at 1800. He states it was on the left side of his chest, down his left arm, into left shoulder blade, he was short of breath but denied nausea or diaphoresis. He says when he arrived at home at 1830 he took a NTG and the pain subsided by 1900. He did not call EMS for evaluation. He states this pain felt similar to his last heart attack. I advised him the risks of not being further evaluated in the ER due to this and how important it is to call EMS at the time of onset. He verbalized understanding but does not want to seek care at present. I obtained vitals and assessment as noted. Reviewed meds and pill box for one week. No refills needed. We reviewed upcoming appointments and the importance of him rescheduling and making his appointment with [facility] for his portable O2 appointment. He verbalized agreement and was given all appropriate numbers and addresses to same. He agreed for home  visit in one week. Visit complete.      BP 130/80   Pulse 79   Resp 16   Wt 161 lb (73 kg)   SpO2 93%   BMI 21.24 kg/m  Weight yesterday-- 161lbs  Last visit weight-- 160lbs    [name], EMT-Paramedic [phone]  ACTION: Home visit completed    Patient Care Team: [name], NP as PCP - General (Nurse Practitioner) Laurier Nancy, MD as PCP - Cardiology (Cardiology)  Patient Active Problem List   Diagnosis Date Noted   Chronic systolic heart failure (HCC) 05/04/2022   Pulmonary hypertension, unspecified (HCC) 04/14/2022   Acute systolic HF (heart failure) (HCC) 04/13/2022   Pulmonary embolism (HCC) 04/09/2022   Acute respiratory failure with hypoxia (HCC) 04/09/2022   Lactic acidosis 04/09/2022   Hypokalemia 04/09/2022   COPD with acute exacerbation (HCC) 05/03/2020   PAF (paroxysmal atrial fibrillation) (HCC)    Syncope 12/31/2018   Intracranial bleed (HCC) 12/31/2018   AKI (acute kidney injury) (HCC)    Frontal lobe contusion (HCC)    Multiple closed facial bone fractures (HCC)    NSTEMI (non-ST elevated myocardial infarction) (HCC) 10/10/2018   Recurrent syncope 04/28/2018   Malignant neoplasm of prostate (HCC) 07/13/2016   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   Visual hallucination 03/18/2015   Auditory hallucination 03/18/2015   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 01/01/2015   Tibial plateau fracture 12/29/2014   Limb ischemia 11/18/2013   Blunt trauma of lower leg 10/08/2013   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HCC) 10/08/2013   Acute blood loss anemia 10/08/2013   Anxiety disorder 10/08/2013   Dyslipidemia 10/08/2013   GERD (gastroesophageal reflux disease) 10/08/2013   History of MI (myocardial infarction) 10/08/2013   Anticoagulated 10/08/2013   Hypertension    Tibia/fibula fracture 10/06/2013   CAD (coronary artery disease) 08/16/2010   Edema 08/16/2010   COPD (chronic obstructive pulmonary disease)  (HCC) 08/16/2010   DVT of leg (deep venous thrombosis) (HCC) 08/16/2010   Leg cramps 08/16/2010    Current Outpatient Medications:    albuterol (PROVENTIL HFA;VENTOLIN HFA) 108 (90 Base) MCG/ACT inhaler, Inhale 2 puffs into the lungs every 6 (six) hours as needed for wheezing or shortness of breath., Disp: 1 Inhaler, Rfl: 2   amiodarone (PACERONE) 200 MG tablet, Take 0.5 tablets (100 mg total) by mouth daily., Disp: 15 tablet, Rfl: 11   apixaban (ELIQUIS) 5 MG TABS tablet, Take 1 tablet (5 mg total) by mouth 2 (two) times daily., Disp: 60 tablet, Rfl: 3   cetirizine (ZYRTEC) 10 MG tablet, Take 1 tablet (10 mg total) by mouth daily., Disp: 30 tablet, Rfl: 11   digoxin (LANOXIN) 0.125 MG tablet, Take 1 tablet (0.125 mg total) by mouth daily., Disp: 30 tablet, Rfl: 6   docusate sodium (COLACE) 100 MG capsule, Take 1 capsule (100 mg total) by mouth daily., Disp: 90 capsule, Rfl: 3   FARXIGA 10 MG TABS tablet, TAKE ONE TABLET BY MOUTH EVERY MORNING, Disp: 90 tablet, Rfl: 0   fluticasone (FLONASE) 50 MCG/ACT nasal spray, Place 1 spray into both nostrils daily., Disp: , Rfl:    lidocaine (LIDODERM) 5 %, Place 1 patch onto the skin daily. Remove & Discard patch within 12 hours or as directed by MD, Disp: 30 patch, Rfl: 11   losartan (COZAAR) 25 MG tablet, Take 1 tablet (25 mg total) by mouth at bedtime., Disp: 90 tablet, Rfl: 1   Multiple Vitamin (MULTIVITAMIN) tablet, Take 1 tablet by mouth daily. For Men, Disp: , Rfl:    nitroGLYCERIN (NITROSTAT) 0.4 MG SL tablet, Place 0.4 mg under the tongue every 5 (five) minutes as needed for chest pain., Disp: , Rfl:    Omega-3 Fatty Acids (FISH OIL PO), Take 1,000 mg by mouth every evening., Disp: , Rfl:    omeprazole (PRILOSEC) 40 MG capsule, Take 40 mg by mouth daily., Disp: , Rfl:    ranolazine (RANEXA) 500 MG 12 hr tablet, Take 1 tablet (500 mg total) by mouth 2 (two) times daily., Disp: 60 tablet, Rfl: 6   rOPINIRole (REQUIP) 1 MG tablet, TAKE ONE TABLET BY  MOUTH EVERY NIGHT AT BEDTIME FOR RESTLESS LEGS, Disp: 30 tablet, Rfl: 3   rosuvastatin (CRESTOR) 10 MG tablet, Take 1 tablet (10 mg total) by mouth daily., Disp: 30 tablet, Rfl: 6   sildenafil (REVATIO) 20 MG tablet, Take 1 tablet (20 mg total) by mouth 3 (three) times daily., Disp: 90 tablet, Rfl: 6   Tiotropium Bromide-Olodaterol (STIOLTO RESPIMAT) 2.5-2.5 MCG/ACT AERS, Inhale 2 puffs into the lungs daily., Disp: 60 each, Rfl: 12 Allergies  Allergen Reactions   Adhesive [Tape] Other (See Comments)    After right leg fracture surgery, pt developed a large blister where tape was applied to his right leg. OK to use paper tape.   Imdur [Isosorbide Dinitrate] Other (See Comments)    hallucinations   Singulair [Montelukast  Sodium] Other (See Comments)    Hallucinations      Social History   Socioeconomic History   Marital status: Single    Spouse name: Not on file   Number of children: 2   Years of education: Not on file   Highest education level: 6th grade  Occupational History   Not on file  Tobacco Use   Smoking status: Former    Packs/day: 0.50    Years: 52.00    Additional pack years: 0.00    Total pack years: 26.00    Types: Cigarettes    Quit date: 07/05/2022    Years since quitting: 0.1   Smokeless tobacco: Never  Vaping Use   Vaping Use: Never used  Substance and Sexual Activity   Alcohol use: No    Alcohol/week: 0.0 standard drinks of alcohol    Comment: Stopped 2009   Drug use: No   Sexual activity: Not Currently  Other Topics Concern   Not on file  Social History Narrative   ** Merged History Encounter **       Social Determinants of Health   Financial Resource Strain: High Risk (04/12/2022)   Overall Financial Resource Strain (CARDIA)    Difficulty of Paying Living Expenses: Hard  Food Insecurity: Food Insecurity Present (08/03/2022)   Hunger Vital Sign    Worried About Running Out of Food in the Last Year: Sometimes true    Ran Out of Food in the Last  Year: Sometimes true  Transportation Needs: No Transportation Needs (05/23/2022)   PRAPARE - Administrator, Civil Service (Medical): No    Lack of Transportation (Non-Medical): No  Physical Activity: Inactive (02/05/2017)   Exercise Vital Sign    Days of Exercise per Week: 0 days    Minutes of Exercise per Session: 0 min  Stress: Stress Concern Present (02/05/2017)   [employer] of Occupational Health - Occupational Stress Questionnaire    Feeling of Stress : Rather much  Social Connections: Moderately Isolated (02/05/2017)   Social Connection and Isolation Panel [NHANES]    Frequency of Communication with Friends and Family: More than three times a week    Frequency of Social Gatherings with Friends and Family: Once a week    Attends Religious Services: Never    Database administrator or Organizations: No    Attends Banker Meetings: Never    Marital Status: Divorced  Catering manager Violence: Not At Risk (04/14/2022)   Humiliation, Afraid, Rape, and Kick questionnaire    Fear of Current or Ex-Partner: No    Emotionally Abused: No    Physically Abused: No    Sexually Abused: No    Physical Exam      Future Appointments  Date Time Provider Department Center  09/01/2022 10:30 AM [name], NP [facility] None

## 2022-09-01 ENCOUNTER — Ambulatory Visit (INDEPENDENT_AMBULATORY_CARE_PROVIDER_SITE_OTHER): Payer: Medicare Other | Admitting: Family

## 2022-09-01 ENCOUNTER — Encounter: Payer: Self-pay | Admitting: Family

## 2022-09-01 ENCOUNTER — Ambulatory Visit: Payer: Medicare Other | Admitting: Nurse Practitioner

## 2022-09-01 VITALS — BP 142/70 | HR 80 | Ht 73.0 in | Wt 158.0 lb

## 2022-09-01 DIAGNOSIS — J42 Unspecified chronic bronchitis: Secondary | ICD-10-CM

## 2022-09-01 DIAGNOSIS — F411 Generalized anxiety disorder: Secondary | ICD-10-CM

## 2022-09-01 DIAGNOSIS — C61 Malignant neoplasm of prostate: Secondary | ICD-10-CM

## 2022-09-01 DIAGNOSIS — F330 Major depressive disorder, recurrent, mild: Secondary | ICD-10-CM

## 2022-09-01 DIAGNOSIS — F331 Major depressive disorder, recurrent, moderate: Secondary | ICD-10-CM | POA: Insufficient documentation

## 2022-09-01 DIAGNOSIS — E119 Type 2 diabetes mellitus without complications: Secondary | ICD-10-CM | POA: Insufficient documentation

## 2022-09-01 DIAGNOSIS — S06331A Contusion and laceration of cerebrum, unspecified, with loss of consciousness of 30 minutes or less, initial encounter: Secondary | ICD-10-CM

## 2022-09-01 DIAGNOSIS — S0292XA Unspecified fracture of facial bones, initial encounter for closed fracture: Secondary | ICD-10-CM

## 2022-09-01 MED ORDER — ALPRAZOLAM 0.25 MG PO TABS: 0.2500 mg | ORAL_TABLET | Freq: Every day | ORAL | 0 refills | Status: DC | PRN

## 2022-09-04 ENCOUNTER — Telehealth (HOSPITAL_COMMUNITY): Payer: Self-pay

## 2022-09-04 DIAGNOSIS — J9601 Acute respiratory failure with hypoxia: Secondary | ICD-10-CM | POA: Diagnosis not present

## 2022-09-04 DIAGNOSIS — I2699 Other pulmonary embolism without acute cor pulmonale: Secondary | ICD-10-CM | POA: Diagnosis not present

## 2022-09-04 DIAGNOSIS — I2510 Atherosclerotic heart disease of native coronary artery without angina pectoris: Secondary | ICD-10-CM | POA: Diagnosis not present

## 2022-09-04 DIAGNOSIS — I4891 Unspecified atrial fibrillation: Secondary | ICD-10-CM | POA: Diagnosis not present

## 2022-09-04 DIAGNOSIS — J449 Chronic obstructive pulmonary disease, unspecified: Secondary | ICD-10-CM | POA: Diagnosis not present

## 2022-09-04 NOTE — Telephone Encounter (Signed)
Attempted to reach out to Bruce [name] for home visit with no answer on his or friends phone. Will continue to attempt.   [name], EMT-Paramedic [phone] 09/04/2022

## 2022-09-05 ENCOUNTER — Other Ambulatory Visit (HOSPITAL_COMMUNITY): Payer: Self-pay

## 2022-09-05 NOTE — Progress Notes (Signed)
Paramedicine Encounter    Patient ID: Bruce [name], male    DOB: 1945-11-30, 77 y.o.   MRN: [id number]   Complaints: - reports 2x episodes of chest pain in the last 2x weeks.  - swelling in legs  - blood in stool (bright red- intermittently)   Assessment - 3+ edema in right leg, 2+ in left. Exertional shortness of breath, no worse than baseline.   Compliance with meds - no missed doses noted or reported.   Pill box filled: Pill box filled x1 week   Refills needed - none  Meds changes since last visit - none   Social changes - none   Vitals taken at 0930am.  Weight yesterday-162  Last visit weight- 161  Arrived to find Bruce [name] sitting upright on his couch at home. Pt denied any increased shortness of breath- more than normal, dizziness, or chest pain, but reported 2x episodes in the last week of a pain that started in his left chest, radiated to his left shoulder blade, with numbness down his left arm. Pt reported that upon onset he was short of breath at rest and diaphoretic.Pt stated that he took NTG and it did ease the pain. Pt was advised of the importance to be further evaluated for same, stressed the need for an ED visit if symptoms were to return. Pt agreed and stated that he was going to go, but the pain stopped so he changed his mind. Pt was informed of the importance of follow up post significant chest pain episode and again was urged if symptoms returned. Pt presented with clear lung sounds today and increased pedal edema. Pt was urged to continue to watch his diet/salt intake and his fluid. Pt was also educated on how to put on compression socks and the importance of same. Pt reported some bright red blood in his stool for the last 2x days. Pt was assessed as noted, Informed of all upcoming events, and pill box filled for 1x week. Will follow up with pt in 1x week.    [name], EMT-Paramedic [phone]  ACTION: Home visit completed    Patient Care  Team: [name], NP as PCP - General (Nurse Practitioner) Laurier Nancy, MD as PCP - Cardiology (Cardiology)  Patient Active Problem List   Diagnosis Date Noted   Mild episode of recurrent major depressive disorder (HCC) 09/01/2022   MDD (major depressive disorder), recurrent episode, moderate (HCC) 09/01/2022   Type 2 diabetes mellitus without complication, without long-term current use of insulin (HCC) 09/01/2022   Chronic systolic heart failure (HCC) 05/04/2022   Pulmonary hypertension, unspecified (HCC) 04/14/2022   Acute systolic HF (heart failure) (HCC) 04/13/2022   Pulmonary embolism (HCC) 04/09/2022   Acute respiratory failure with hypoxia (HCC) 04/09/2022   Lactic acidosis 04/09/2022   Hypokalemia 04/09/2022   COPD with acute exacerbation (HCC) 05/03/2020   PAF (paroxysmal atrial fibrillation) (HCC)    Syncope 12/31/2018   Intracranial bleed (HCC) 12/31/2018   AKI (acute kidney injury) (HCC)    Contusion of frontal lobe with loss of consciousness of 30 minutes or less, unspecified laterality, initial encounter (HCC)    Multiple closed facial bone fractures (HCC)    NSTEMI (non-ST elevated myocardial infarction) (HCC) 10/10/2018   Recurrent syncope 04/28/2018   Malignant neoplasm of prostate (HCC) 07/13/2016   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   Visual hallucination 03/18/2015   Auditory hallucination 03/18/2015   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 01/01/2015  Tibial plateau fracture 12/29/2014   Limb ischemia 11/18/2013   Blunt trauma of lower leg 10/08/2013   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HCC) 10/08/2013   Acute blood loss anemia 10/08/2013   Anxiety disorder 10/08/2013   Dyslipidemia 10/08/2013   GERD (gastroesophageal reflux disease) 10/08/2013   History of MI (myocardial infarction) 10/08/2013   Anticoagulated 10/08/2013   Hypertension    Tibia/fibula fracture 10/06/2013   CAD (coronary artery disease) 08/16/2010    Edema 08/16/2010   COPD (chronic obstructive pulmonary disease) (HCC) 08/16/2010   DVT of leg (deep venous thrombosis) (HCC) 08/16/2010   Leg cramps 08/16/2010    Current Outpatient Medications:    albuterol (PROVENTIL HFA;VENTOLIN HFA) 108 (90 Base) MCG/ACT inhaler, Inhale 2 puffs into the lungs every 6 (six) hours as needed for wheezing or shortness of breath., Disp: 1 Inhaler, Rfl: 2   ALPRAZolam (XANAX) 0.25 MG tablet, Take 1 tablet (0.25 mg total) by mouth daily as needed for anxiety., Disp: 30 tablet, Rfl: 0   amiodarone (PACERONE) 200 MG tablet, Take 0.5 tablets (100 mg total) by mouth daily., Disp: 15 tablet, Rfl: 11   apixaban (ELIQUIS) 5 MG TABS tablet, Take 1 tablet (5 mg total) by mouth 2 (two) times daily., Disp: 60 tablet, Rfl: 3   cetirizine (ZYRTEC) 10 MG tablet, Take 1 tablet (10 mg total) by mouth daily., Disp: 30 tablet, Rfl: 11   cholecalciferol (VITAMIN D3) 25 MCG (1000 UNIT) tablet, Take 1,000 Units by mouth daily., Disp: , Rfl:    digoxin (LANOXIN) 0.125 MG tablet, Take 1 tablet (0.125 mg total) by mouth daily., Disp: 30 tablet, Rfl: 6   FARXIGA 10 MG TABS tablet, TAKE ONE TABLET BY MOUTH EVERY MORNING, Disp: 90 tablet, Rfl: 0   fluticasone (FLONASE) 50 MCG/ACT nasal spray, Place 1 spray into both nostrils daily., Disp: , Rfl:    lidocaine (LIDODERM) 5 %, Place 1 patch onto the skin daily. Remove & Discard patch within 12 hours or as directed by MD, Disp: 30 patch, Rfl: 11   losartan (COZAAR) 25 MG tablet, Take 1 tablet (25 mg total) by mouth at bedtime., Disp: 90 tablet, Rfl: 1   Multiple Vitamin (MULTIVITAMIN) tablet, Take 1 tablet by mouth daily. For Men, Disp: , Rfl:    nitroGLYCERIN (NITROSTAT) 0.4 MG SL tablet, Place 0.4 mg under the tongue every 5 (five) minutes as needed for chest pain., Disp: , Rfl:    Omega-3 Fatty Acids (FISH OIL PO), Take 1,000 mg by mouth every evening., Disp: , Rfl:    omeprazole (PRILOSEC) 40 MG capsule, Take 40 mg by mouth daily., Disp: ,  Rfl:    ranolazine (RANEXA) 500 MG 12 hr tablet, Take 1 tablet (500 mg total) by mouth 2 (two) times daily., Disp: 60 tablet, Rfl: 6   rOPINIRole (REQUIP) 1 MG tablet, TAKE ONE TABLET BY MOUTH EVERY NIGHT AT BEDTIME FOR RESTLESS LEGS (Patient taking differently: Take 1 mg by mouth at bedtime.), Disp: 30 tablet, Rfl: 3   rosuvastatin (CRESTOR) 10 MG tablet, Take 1 tablet (10 mg total) by mouth daily., Disp: 30 tablet, Rfl: 6   sildenafil (REVATIO) 20 MG tablet, Take 1 tablet (20 mg total) by mouth 3 (three) times daily., Disp: 90 tablet, Rfl: 6   Tiotropium Bromide-Olodaterol (STIOLTO RESPIMAT) 2.5-2.5 MCG/ACT AERS, Inhale 2 puffs into the lungs daily., Disp: 60 each, Rfl: 12   docusate sodium (COLACE) 100 MG capsule, Take 1 capsule (100 mg total) by mouth daily. (Patient not taking: Reported on 09/05/2022), Disp:  90 capsule, Rfl: 3 Allergies  Allergen Reactions   Adhesive [Tape] Other (See Comments)    After right leg fracture surgery, pt developed a large blister where tape was applied to his right leg. OK to use paper tape.   Imdur [Isosorbide Dinitrate] Other (See Comments)    hallucinations   Singulair [Montelukast Sodium] Other (See Comments)    Hallucinations      Social History   Socioeconomic History   Marital status: Single    Spouse name: Not on file   Number of children: 2   Years of education: Not on file   Highest education level: 6th grade  Occupational History   Not on file  Tobacco Use   Smoking status: Former    Packs/day: 0.50    Years: 52.00    Additional pack years: 0.00    Total pack years: 26.00    Types: Cigarettes    Quit date: 07/05/2022    Years since quitting: 0.1   Smokeless tobacco: Never  Vaping Use   Vaping Use: Never used  Substance and Sexual Activity   Alcohol use: No    Alcohol/week: 0.0 standard drinks of alcohol    Comment: Stopped 2009   Drug use: No   Sexual activity: Not Currently  Other Topics Concern   Not on file  Social History  Narrative   ** Merged History Encounter **       Social Determinants of Health   Financial Resource Strain: High Risk (04/12/2022)   Overall Financial Resource Strain (CARDIA)    Difficulty of Paying Living Expenses: Hard  Food Insecurity: Food Insecurity Present (08/03/2022)   Hunger Vital Sign    Worried About Running Out of Food in the Last Year: Sometimes true    Ran Out of Food in the Last Year: Sometimes true  Transportation Needs: No Transportation Needs (05/23/2022)   PRAPARE - Administrator, Civil Service (Medical): No    Lack of Transportation (Non-Medical): No  Physical Activity: Inactive (02/05/2017)   Exercise Vital Sign    Days of Exercise per Week: 0 days    Minutes of Exercise per Session: 0 min  Stress: Stress Concern Present (02/05/2017)   [employer] of Occupational Health - Occupational Stress Questionnaire    Feeling of Stress : Rather much  Social Connections: Moderately Isolated (02/05/2017)   Social Connection and Isolation Panel [NHANES]    Frequency of Communication with Friends and Family: More than three times a week    Frequency of Social Gatherings with Friends and Family: Once a week    Attends Religious Services: Never    Database administrator or Organizations: No    Attends Banker Meetings: Never    Marital Status: Divorced  Catering manager Violence: Not At Risk (04/14/2022)   Humiliation, Afraid, Rape, and Kick questionnaire    Fear of Current or Ex-Partner: No    Emotionally Abused: No    Physically Abused: No    Sexually Abused: No    Physical Exam      Future Appointments  Date Time Provider Department Center  10/03/2022 10:00 AM [name], NP [facility] None  01/15/2023  2:00 PM [facility] OP 1 [facility]  01/15/2023  3:00 PM [facility] PA/NP [facility] None

## 2022-09-08 ENCOUNTER — Encounter: Payer: Self-pay | Admitting: Family

## 2022-09-08 NOTE — Assessment & Plan Note (Signed)
Sending refill of the alprazolam for pt.  He has been irritable (more than usual he says) and says that this has helped him before.

## 2022-09-08 NOTE — Assessment & Plan Note (Signed)
Will work to see if we are able to get the patient a new concentrator that does NOT require tanks, as this is not a practical application for him.  He cannot use the tank system for multiple reasons, and is otherwise just not using his oxygen.

## 2022-09-08 NOTE — Progress Notes (Signed)
Established Patient Office Visit  Subjective:  Patient ID: Bruce [name], male    DOB: 28-Nov-1945  Age: 77 y.o. MRN: [id number]  Chief Complaint  Patient presents with   Follow-up    3 months follow up    Patient is here today for his 3 months follow up.  He has been feeling poorly since last appointment.   He does have additional concerns to discuss today.  He has been in and out of the hospital multiple times, is feeling much better today.  He does says that he has been having an increase in his shortness of breath and needs his oxygen, but he has been unable to get a concentrator that he can take with him.  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 Instead of doing less, he has been not using the oxygen.   Labs are due today. He needs refills.   I have reviewed his active problem list, medication list, allergies, notes from last encounter, lab results, and hospital notes. for his appointment today.    No other concerns at this time.   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)    '09 AND '12   Peripheral vascular disease (HCC)    Shortness of  breath    With exertion .   Tibia/fibula fracture 10/06/2013   Tibial plateau fracture 12/29/2014   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HCC) 10/08/2013   UTI (urinary tract infection)    frequent UTI    Past Surgical History:  Procedure Laterality Date   CARDIAC CATHETERIZATION     CHOLECYSTECTOMY     EXTERNAL FIXATION LEG Right 10/06/2013   Procedure: CLOSED REDUCTION RIGHT TIBIAL PLATEAU FRACTURE, EXTERNAL FIXATION RIGHT LEG, PLACEMENT OF WOUND VAC;  Surgeon: Budd Palmer, MD;  Location: [facility] OR;  Service: Orthopedics;  Laterality: Right;   FEMORAL-POPLITEAL BYPASS GRAFT Right 10/06/2013   Procedure: RIGHT POPLITEAL-POPLITEAL ARTERY BYPASS GRAFT;  Surgeon: Larina Earthly, MD;  Location[facility];  Service: Vascular;  Laterality: Right;   FRACTURE SURGERY Right 2014   HARDWARE REMOVAL Right 12/02/2013   Procedure: REMOVAL EXTERNAL FIXATION RIGHT LEG ;  Surgeon: Budd Palmer, MD;  Location: [facility] OR;  Service: Orthopedics;  Laterality: Right;   HERNIA REPAIR Right 1990's   I & D EXTREMITY Right 10/09/2013   Procedure: IRRIGATION AND DEBRIDEMENT RIGHT LEG, CLOSURE  OF WOUNDS, PLACEMENT OF WOUND VAC ON EACH SIDE OF LEG;  Surgeon: Budd Palmer, MD;  Location: [facility] OR;  Service: Orthopedics;  Laterality: Right;   IR ANGIOGRAM PULMONARY RIGHT SELECTIVE  04/10/2022   IR ANGIOGRAM SELECTIVE EACH ADDITIONAL VESSEL  04/10/2022   IR NEPHROSTOMY PLACEMENT LEFT  06/19/2016   IR THROMBECT PRIM MECH INIT (INCLU) MOD SED  04/10/2022   IR US GUIDE VASC ACCESS RIGHT  04/10/2022   IVC filter  2009   placed @ [facility]/ Removed in 2010.   IVC Filter Removed     LEFT HEART CATH AND CORONARY ANGIOGRAPHY Right 10/11/2018   Procedure: LEFT HEART CATH AND CORONARY ANGIOGRAPHY;  Surgeon: Laurier Nancy, MD;  Location: [facility] INVASIVE CV LAB;  Service: Cardiovascular;  Laterality: Right;   ligament leg Left    NEPHROLITHOTOMY Left 06/19/2016   Procedure: NEPHROLITHOTOMY PERCUTANEOUS;  Surgeon: Vanna Scotland, MD;  Location: [facility] ORS;   Service: Urology;  Laterality: Left;   ORIF TIBIA FRACTURE Right 12/29/2014   Procedure: OPEN REDUCTION INTERNAL FIXATION (ORIF) RIGHT TIBIA FRACTURE, RIA VS ICBG;  Surgeon: Myrene Galas, MD;  Location: [facility] OR;  Service: Orthopedics;  Laterality: Right;   RADIOACTIVE SEED IMPLANT N/A 09/12/2016   Procedure: RADIOACTIVE SEED IMPLANT/BRACHYTHERAPY IMPLANT;  Surgeon: Vanna Scotland, MD;  Location: [facility] ORS;  Service: Urology;  Laterality: N/A;   RIGHT/LEFT HEART CATH AND CORONARY ANGIOGRAPHY N/A 04/13/2022   Procedure: RIGHT/LEFT HEART CATH AND CORONARY ANGIOGRAPHY;  Surgeon: Dolores Patty, MD;  [facility]: [facility];  Service: Cardiovascular;  Laterality: N/A;    Social History   Socioeconomic History   Marital status: Single    Spouse name: Not on file   Number of children: 2   Years of education: Not on file   Highest education level: 6th grade  Occupational History   Not on file  Tobacco Use   Smoking status: Former    Packs/day: 0.50    Years: 52.00    Additional pack years: 0.00    Total pack years: 26.00    Types: Cigarettes    Quit date: 07/05/2022    Years since quitting: 0.1   Smokeless tobacco: Never  Vaping Use   Vaping Use: Never used  Substance and Sexual Activity   Alcohol use: No    Alcohol/week: 0.0 standard drinks of alcohol    Comment: Stopped 2009   Drug use: No   Sexual activity: Not Currently  Other Topics Concern   Not on file  Social History Narrative   ** Merged History Encounter **       Social Determinants of Health   Financial Resource Strain: High Risk (04/12/2022)   Overall Financial Resource Strain (CARDIA)    Difficulty of Paying Living Expenses: Hard  Food Insecurity: Food Insecurity Present (08/03/2022)   Hunger Vital Sign    Worried About Running Out of Food in the Last Year: Sometimes true    Ran Out of Food in the Last Year: Sometimes true  Transportation Needs: No Transportation Needs (05/23/2022)   PRAPARE - Therapist, art (Medical): No    Lack of Transportation (Non-Medical): No  Physical Activity: Inactive (02/05/2017)   Exercise Vital Sign    Days of Exercise per Week: 0 days    Minutes of Exercise per Session: 0 min  Stress: Stress Concern Present (02/05/2017)   [employer] of Occupational Health - Occupational Stress Questionnaire    Feeling of Stress : Rather much  Social Connections: Moderately Isolated (02/05/2017)   Social Connection and Isolation Panel [NHANES]    Frequency of Communication with Friends and Family:  More than three times a week    Frequency of Social Gatherings with Friends and Family: Once a week    Attends Religious Services: Never    Database administrator or Organizations: No    Attends Banker Meetings: Never    Marital Status: Divorced  Catering manager Violence: Not At Risk (04/14/2022)   Humiliation, Afraid, Rape, and Kick questionnaire    Fear of Current or Ex-Partner: No    Emotionally Abused: No    Physically Abused: No    Sexually Abused: No    Family History  Problem Relation Age of Onset   Hypertension Mother    Prostate cancer Brother    Mental illness Neg Hx     Allergies  Allergen Reactions   Adhesive [Tape] Other (See Comments)    After right leg fracture surgery, pt developed a large blister where tape was applied to his right leg. OK to use paper tape.   Imdur [Isosorbide Dinitrate] Other (See Comments)    hallucinations   Singulair [Montelukast Sodium] Other (See Comments)    Hallucinations     Review of Systems  Respiratory:  Positive for shortness of breath and wheezing.   Psychiatric/Behavioral:  The patient is nervous/anxious.   All other systems reviewed and are negative.      Objective:   BP (!) 142/70   Pulse 80   Ht 6\' 1"  (1.854 m)   Wt 158 lb (71.7 kg)   SpO2 (!) 88%   BMI 20.85 kg/m   Vitals:   09/01/22 1025  BP: (!) 142/70  Pulse: 80  Height: 6\' 1"  (1.854 m)  Weight: 158  lb (71.7 kg)  SpO2: (!) 88%  BMI (Calculated): 20.85    Physical Exam Vitals and nursing note reviewed.  Constitutional:      Appearance: Normal appearance. He is normal weight.  Eyes:     Extraocular Movements: Extraocular movements intact.     Conjunctiva/sclera: Conjunctivae normal.     Pupils: Pupils are equal, round, and reactive to light.  Cardiovascular:     Rate and Rhythm: Normal rate.     Pulses: Normal pulses.  Pulmonary:     Effort: Pulmonary effort is normal.     Breath sounds: Wheezing present.  Neurological:     General: No focal deficit present.     Mental Status: He is alert and oriented to person, place, and time. Mental status is at baseline.  Psychiatric:        Mood and Affect: Mood normal.        Behavior: Behavior normal.        Thought Content: Thought content normal.        Judgment: Judgment normal.      No results found for any visits on 09/01/22.  Recent Results (from the past 2160 hour(s))  CBC     Status: Abnormal   Collection Time: 06/21/22 12:58 PM  Result Value Ref Range   WBC 8.0 4.0 - 10.5 K/uL   RBC 4.08 (L) 4.22 - 5.81 MIL/uL   Hemoglobin 12.3 (L) 13.0 - 17.0 g/dL   HCT 16.1 (L) 09.6 - 04.5 %   MCV 92.6 80.0 - 100.0 fL   MCH 30.1 26.0 - 34.0 pg   MCHC 32.5 30.0 - 36.0 g/dL   RDW 40.9 (H) 81.1 - 91.4 %   Platelets 331 150 - 400 K/uL   nRBC 0.0 0.0 - 0.2 %    Comment: Performed at [facility]  Lab, [address], [place], [place] [address]  B Nat Peptide     Status: Abnormal   Collection Time: 06/21/22 12:58 PM  Result Value Ref Range   B Natriuretic Peptide 367.7 (H) 0.0 - 100.0 pg/mL    Comment: Performed at [facility] Lab, [address], [place], [place] [address]  Basic Metabolic Panel (BMET)     Status: Abnormal   Collection Time: 06/21/22 12:58 PM  Result Value Ref Range   Sodium 139 135 - 145 mmol/L   Potassium 5.2 (H) 3.5 - 5.1 mmol/L   Chloride 107 98 - 111 mmol/L   CO2 24 22 - 32 mmol/L   Glucose, Bld 105  (H) 70 - 99 mg/dL    Comment: Glucose reference range applies only to samples taken after fasting for at least 8 hours.   BUN 16 8 - 23 mg/dL   Creatinine, Ser 2.83 0.61 - 1.24 mg/dL   Calcium 9.7 8.9 - 15.1 mg/dL   GFR, Estimated >76 >16 mL/min    Comment: (NOTE) Calculated using the CKD-EPI Creatinine Equation (2021)    Anion gap 8 5 - 15    Comment: Performed at [facility], [address], [place], [place] [address]  TSH     Status: Abnormal   Collection Time: 06/21/22 12:58 PM  Result Value Ref Range   TSH 6.484 (H) 0.350 - 4.500 uIU/mL    Comment: Performed by a 3rd Generation assay with a functional sensitivity of <=0.01 uIU/mL. Performed at [facility] Lab, [address], [place], [place] [address]   T3, free     Status: None   Collection Time: 06/21/22 12:58 PM  Result Value Ref Range   T3, Free 2.3 2.0 - 4.4 pg/mL    Comment: (NOTE) Performed At: [facility] [address] [place], [place] [id number] Jolene Schimke MD JK:[id number]   T4, free     Status: Abnormal   Collection Time: 06/21/22 12:58 PM  Result Value Ref Range   Free T4 1.19 (H) 0.61 - 1.12 ng/dL    Comment: (NOTE) Biotin ingestion may interfere with free T4 tests. If the results are inconsistent with the TSH level, previous test results, or the clinical presentation, then consider biotin interference. If needed, order repeat testing after stopping biotin. Performed at [facility] Lab, [address] [place], [place] [address]   CBC with Differential     Status: Abnormal   Collection Time: 07/06/22  6:21 PM  Result Value Ref Range   WBC 6.9 4.0 - 10.5 K/uL   RBC 3.85 (L) 4.22 - 5.81 MIL/uL   Hemoglobin 11.6 (L) 13.0 - 17.0 g/dL   HCT 78.9 (L) 38.1 - 01.7 %   MCV 95.6 80.0 - 100.0 fL   MCH 30.1 26.0 - 34.0 pg   MCHC 31.5 30.0 - 36.0 g/dL   RDW 51.0 (H) 25.8 - 52.7 %   Platelets 276 150 - 400 K/uL   nRBC 0.0 0.0 - 0.2 %   Neutrophils Relative % 80 %   Neutro Abs 5.5 1.7  - 7.7 K/uL   Lymphocytes Relative 10 %   Lymphs Abs 0.7 0.7 - 4.0 K/uL   Monocytes Relative 7 %   Monocytes Absolute 0.5 0.1 - 1.0 K/uL   Eosinophils Relative 3 %   Eosinophils Absolute 0.2 0.0 - 0.5 K/uL   Basophils Relative 0 %   Basophils Absolute 0.0 0.0 - 0.1 K/uL   Immature Granulocytes 0 %  Abs Immature Granulocytes 0.02 0.00 - 0.07 K/uL    Comment: Performed at [facility] Lab, [address], [place], [place] [address]  Comprehensive metabolic panel     Status: Abnormal   Collection Time: 07/06/22  6:21 PM  Result Value Ref Range   Sodium 141 135 - 145 mmol/L   Potassium 3.8 3.5 - 5.1 mmol/L   Chloride 109 98 - 111 mmol/L   CO2 20 (L) 22 - 32 mmol/L   Glucose, Bld 89 70 - 99 mg/dL    Comment: Glucose reference range applies only to samples taken after fasting for at least 8 hours.   BUN 14 8 - 23 mg/dL   Creatinine, Ser 5.62 0.61 - 1.24 mg/dL   Calcium 9.4 8.9 - 13.0 mg/dL   Total Protein 6.0 (L) 6.5 - 8.1 g/dL   Albumin 2.9 (L) 3.5 - 5.0 g/dL   AST 19 15 - 41 U/L   ALT 18 0 - 44 U/L   Alkaline Phosphatase 93 38 - 126 U/L   Total Bilirubin 0.5 0.3 - 1.2 mg/dL   GFR, Estimated >86 >57 mL/min    Comment: (NOTE) Calculated using the CKD-EPI Creatinine Equation (2021)    Anion gap 12 5 - 15    Comment: Performed at [facility] Lab, [address], [place], [place] [address]  Resp panel by RT-PCR (RSV, Flu A&B, Covid) Anterior Nasal Swab     Status: None   Collection Time: 07/06/22  6:23 PM   Specimen: Anterior Nasal Swab  Result Value Ref Range   SARS Coronavirus 2 by RT PCR NEGATIVE NEGATIVE   Influenza A by PCR NEGATIVE NEGATIVE   Influenza B by PCR NEGATIVE NEGATIVE    Comment: (NOTE) The Xpert Xpress SARS-CoV-2/FLU/RSV plus assay is intended as an aid in the diagnosis of influenza from Nasopharyngeal swab specimens and should not be used as a sole basis for treatment. Nasal washings and aspirates are unacceptable for Xpert Xpress  SARS-CoV-2/FLU/RSV testing.  Fact Sheet for Patients: BloggerCourse.com  Fact Sheet for Healthcare Providers: SeriousBroker.it  This test is not yet approved or cleared by the [place] FDA and has been authorized for detection and/or diagnosis of SARS-CoV-2 by FDA under an Emergency Use Authorization (EUA). This EUA will remain in effect (meaning this test can be used) for the duration of the COVID-19 declaration under Section 564(b)(1) of the Act, 21 U.S.C. section 360bbb-3(b)(1), unless the authorization is terminated or revoked.     Resp Syncytial Virus by PCR NEGATIVE NEGATIVE    Comment: (NOTE) Fact Sheet for Patients: BloggerCourse.com  Fact Sheet for Healthcare Providers: SeriousBroker.it  This test is not yet approved or cleared by the [place] FDA and has been authorized for detection and/or diagnosis of SARS-CoV-2 by FDA under an Emergency Use Authorization (EUA). This EUA will remain in effect (meaning this test can be used) for the duration of the COVID-19 declaration under Section 564(b)(1) of the Act, 21 U.S.C. section 360bbb-3(b)(1), unless the authorization is terminated or revoked.  Performed at [facility] Lab, [address] [place], [place] [address]        Assessment & Plan:   Problem List Items Addressed This Visit       Active Problems   COPD (chronic obstructive pulmonary disease) (HCC)    Will work to see if we are able to get the patient a new concentrator that does NOT require tanks, as this is not a practical application for him.  He cannot use the tank system for multiple reasons, and is otherwise just not using his oxygen.       Anxiety disorder - Primary (Chronic)    Sending refill of the alprazolam for pt.  He has been irritable (more than usual he says) and says that this has helped him before.        Relevant  Medications   ALPRAZolam (XANAX) 0.25 MG tablet    Return in about 1 month (around 10/01/2022) for F/U.   Total time spent: 30 minutes  Miki Kins, FNP  09/01/2022   This document may have been prepared by [facility] Voice Recognition software and as such may include unintentional dictation errors.

## 2022-09-12 ENCOUNTER — Telehealth (HOSPITAL_COMMUNITY): Payer: Self-pay

## 2022-09-12 ENCOUNTER — Other Ambulatory Visit (HOSPITAL_COMMUNITY): Payer: Self-pay

## 2022-09-12 DIAGNOSIS — J9611 Chronic respiratory failure with hypoxia: Secondary | ICD-10-CM

## 2022-09-12 NOTE — Telephone Encounter (Signed)
Order placed to [employer] for POC.  Bruce [name] is aware and voiced her understanding. She will make patient aware.  Nothing further needed.

## 2022-09-12 NOTE — Progress Notes (Signed)
Paramedicine Encounter    Patient ID: Bruce [name], male    DOB: 1945/06/16, 77 y.o.   MRN: [id number]   Complaints- chronic shortness of breath- fatigue   Assessment- CAOx4, warm and dry seated- not wearing home oxygen during visit, lungs clear, no lower leg edema- much improved from last week.   Compliance with meds-5 missed morning doses, 7 missed noon doses, 6 missed night doses over the last week. (Says he has bottles of meds in his trucks of left over meds that he has been using)   Pill box filled- for one week   Refills needed- losartan   Meds changes since last visit- none     Social changes- none   Arrived for home visit for [facility] who reports to be feeling short of breath today due to the heat and not having a portable oxygen to take outdoors or out to run errands. He is currently not wearing his oxygen at home. I obtained vitals and assessment. Swelling improved from last week. He did not comply with his pill box over the last week- only appeared to take two morning doses and one night dose. He reports using old medicine bottles out of his truck but he explains to me he isn't sure how to take his medications that's why he asked for a pill box. I explained the importance of med compliance and to be sure to utilize current meds in the pill box. He verbalized understanding.   He reports he went to [facility] to get his portable oxygen and they we're forcing him to do a walk test in which [facility] advised he didn't have to- but, I spoke to RN with [facility] who talked with [employer] and they needed an updated order so she is having provider send updated signed order for portable oxygen with no walk test needed to [employer]. I called Jhaden to update him and he is aware. I will call [employer] to set up next appointment for him later this week once order has been sent.   HF education with diet, med compliance reviewed.   Appointments reviewed and confirmed. I plan to see Willson in one week. He agreed. Home visit  complete.    BP 120/62   Pulse 72   Resp 16   Wt 162 lb (73.5 kg)   SpO2 90%   BMI 21.37 kg/m  Weight yesterday-- didn't weigh   Last visit weight-- 162lbs   [name], EMT-Paramedic [phone]  ACTION: Home visit completed    Patient Care Team: [name], NP as PCP - General (Nurse Practitioner) Laurier Nancy, MD as PCP - Cardiology (Cardiology)  Patient Active Problem List   Diagnosis Date Noted   MDD (major depressive disorder), recurrent episode, moderate (HCC) 09/01/2022   Chronic systolic heart failure (HCC) 05/04/2022   Pulmonary hypertension, unspecified (HCC) 04/14/2022   Acute systolic HF (heart failure) (HCC) 04/13/2022   Pulmonary embolism (HCC) 04/09/2022   Lactic acidosis 04/09/2022   Hypokalemia 04/09/2022   PAF (paroxysmal atrial fibrillation) (HCC)    Syncope 12/31/2018   Intracranial bleed (HCC) 12/31/2018   Contusion of frontal lobe with loss of consciousness of 30 minutes or less, unspecified laterality, initial encounter (HCC)    NSTEMI (non-ST elevated myocardial infarction) (HCC) 10/10/2018   Recurrent syncope 04/28/2018   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   Visual hallucination 03/18/2015   Auditory hallucination 03/18/2015   Limb ischemia 11/18/2013   Blunt trauma of lower leg 10/08/2013   Acute blood loss anemia 10/08/2013  Anxiety disorder 10/08/2013   Dyslipidemia 10/08/2013   GERD (gastroesophageal reflux disease) 10/08/2013   History of MI (myocardial infarction) 10/08/2013   Anticoagulated 10/08/2013   Hypertension    CAD (coronary artery disease) 08/16/2010   Edema 08/16/2010   COPD (chronic obstructive pulmonary disease) (HCC) 08/16/2010   DVT of leg (deep venous thrombosis) (HCC) 08/16/2010   Leg cramps 08/16/2010    Current Outpatient Medications:    albuterol (PROVENTIL HFA;VENTOLIN HFA) 108 (90 Base) MCG/ACT inhaler, Inhale 2 puffs into the lungs every 6 (six) hours as needed for wheezing or shortness of  breath., Disp: 1 Inhaler, Rfl: 2   ALPRAZolam (XANAX) 0.25 MG tablet, Take 1 tablet (0.25 mg total) by mouth daily as needed for anxiety., Disp: 30 tablet, Rfl: 0   amiodarone (PACERONE) 200 MG tablet, Take 0.5 tablets (100 mg total) by mouth daily., Disp: 15 tablet, Rfl: 11   apixaban (ELIQUIS) 5 MG TABS tablet, Take 1 tablet (5 mg total) by mouth 2 (two) times daily., Disp: 60 tablet, Rfl: 3   cetirizine (ZYRTEC) 10 MG tablet, Take 1 tablet (10 mg total) by mouth daily., Disp: 30 tablet, Rfl: 11   cholecalciferol (VITAMIN D3) 25 MCG (1000 UNIT) tablet, Take 1,000 Units by mouth daily., Disp: , Rfl:    digoxin (LANOXIN) 0.125 MG tablet, Take 1 tablet (0.125 mg total) by mouth daily., Disp: 30 tablet, Rfl: 6   docusate sodium (COLACE) 100 MG capsule, Take 1 capsule (100 mg total) by mouth daily., Disp: 90 capsule, Rfl: 3   FARXIGA 10 MG TABS tablet, TAKE ONE TABLET BY MOUTH EVERY MORNING, Disp: 90 tablet, Rfl: 0   fluticasone (FLONASE) 50 MCG/ACT nasal spray, Place 1 spray into both nostrils daily., Disp: , Rfl:    lidocaine (LIDODERM) 5 %, Place 1 patch onto the skin daily. Remove & Discard patch within 12 hours or as directed by MD, Disp: 30 patch, Rfl: 11   losartan (COZAAR) 25 MG tablet, Take 1 tablet (25 mg total) by mouth at bedtime., Disp: 90 tablet, Rfl: 1   Multiple Vitamin (MULTIVITAMIN) tablet, Take 1 tablet by mouth daily. For Men, Disp: , Rfl:    nitroGLYCERIN (NITROSTAT) 0.4 MG SL tablet, Place 0.4 mg under the tongue every 5 (five) minutes as needed for chest pain., Disp: , Rfl:    Omega-3 Fatty Acids (FISH OIL PO), Take 1,000 mg by mouth every evening., Disp: , Rfl:    omeprazole (PRILOSEC) 40 MG capsule, Take 40 mg by mouth daily., Disp: , Rfl:    ranolazine (RANEXA) 500 MG 12 hr tablet, Take 1 tablet (500 mg total) by mouth 2 (two) times daily., Disp: 60 tablet, Rfl: 6   rOPINIRole (REQUIP) 1 MG tablet, TAKE ONE TABLET BY MOUTH EVERY NIGHT AT BEDTIME FOR RESTLESS LEGS (Patient  taking differently: Take 1 mg by mouth at bedtime.), Disp: 30 tablet, Rfl: 3   rosuvastatin (CRESTOR) 10 MG tablet, Take 1 tablet (10 mg total) by mouth daily., Disp: 30 tablet, Rfl: 6   sildenafil (REVATIO) 20 MG tablet, Take 1 tablet (20 mg total) by mouth 3 (three) times daily., Disp: 90 tablet, Rfl: 6   Tiotropium Bromide-Olodaterol (STIOLTO RESPIMAT) 2.5-2.5 MCG/ACT AERS, Inhale 2 puffs into the lungs daily., Disp: 60 each, Rfl: 12 Allergies  Allergen Reactions   Adhesive [Tape] Other (See Comments)    After right leg fracture surgery, pt developed a large blister where tape was applied to his right leg. OK to use paper tape.   Imdur [  Isosorbide Dinitrate] Other (See Comments)    hallucinations   Singulair [Montelukast Sodium] Other (See Comments)    Hallucinations      Social History   Socioeconomic History   Marital status: Single    Spouse name: Not on file   Number of children: 2   Years of education: Not on file   Highest education level: 6th grade  Occupational History   Not on file  Tobacco Use   Smoking status: Former    Packs/day: 0.50    Years: 52.00    Additional pack years: 0.00    Total pack years: 26.00    Types: Cigarettes    Quit date: 07/05/2022    Years since quitting: 0.1   Smokeless tobacco: Never  Vaping Use   Vaping Use: Never used  Substance and Sexual Activity   Alcohol use: No    Alcohol/week: 0.0 standard drinks of alcohol    Comment: Stopped 2009   Drug use: No   Sexual activity: Not Currently  Other Topics Concern   Not on file  Social History Narrative   ** Merged History Encounter **       Social Determinants of Health   Financial Resource Strain: High Risk (04/12/2022)   Overall Financial Resource Strain (CARDIA)    Difficulty of Paying Living Expenses: Hard  Food Insecurity: Food Insecurity Present (08/03/2022)   Hunger Vital Sign    Worried About Running Out of Food in the Last Year: Sometimes true    Ran Out of Food in the  Last Year: Sometimes true  Transportation Needs: No Transportation Needs (05/23/2022)   PRAPARE - Administrator, Civil Service (Medical): No    Lack of Transportation (Non-Medical): No  Physical Activity: Inactive (02/05/2017)   Exercise Vital Sign    Days of Exercise per Week: 0 days    Minutes of Exercise per Session: 0 min  Stress: Stress Concern Present (02/05/2017)   [employer] of Occupational Health - Occupational Stress Questionnaire    Feeling of Stress : Rather much  Social Connections: Moderately Isolated (02/05/2017)   Social Connection and Isolation Panel [NHANES]    Frequency of Communication with Friends and Family: More than three times a week    Frequency of Social Gatherings with Friends and Family: Once a week    Attends Religious Services: Never    Database administrator or Organizations: No    Attends Banker Meetings: Never    Marital Status: Divorced  Catering manager Violence: Not At Risk (04/14/2022)   Humiliation, Afraid, Rape, and Kick questionnaire    Fear of Current or Ex-Partner: No    Emotionally Abused: No    Physically Abused: No    Sexually Abused: No    Physical Exam      Future Appointments  Date Time Provider Department Center  10/03/2022 10:00 AM [name], NP AMA-AMA None  01/15/2023  2:00 PM [facility] ECHO OP 1 [facility]  01/15/2023  3:00 PM [facility] PA/NP [facility] None

## 2022-09-12 NOTE — Telephone Encounter (Signed)
Saw Bruce [name] in the home today- he reports he went to his appointment with [employer] however they were still demanding him do a walk test in which he was told by [facility] he did not have to because it had been done in their office already- they refused to give him his portable oxygen and he is still without same. I will forward to [facility] and have them assist in follow up.   [name], EMT-Paramedic [phone] 09/12/2022

## 2022-09-12 NOTE — Telephone Encounter (Signed)
Bruce [name] is aware of below message and voiced her understanding.  Nothing further needed.

## 2022-09-12 NOTE — Telephone Encounter (Signed)
Spoke to Bruce [name] with [employer]. She stated that order placed by Dr. Raechel Ache with [facility] did not state POC.  I do see where patient was walked in our office on 07/20/2022 and qualified for 3L, however an order was not placed.  Dr. Aundria Rud, please advise if okay to place order for POC at 3L based off of last walk test.   Lm for [facility].

## 2022-09-12 NOTE — Addendum Note (Signed)
Addended by: Lajoyce Lauber A on: 09/12/2022 04:23 PM   Modules accepted: Orders

## 2022-09-19 ENCOUNTER — Telehealth (HOSPITAL_COMMUNITY): Payer: Self-pay

## 2022-09-19 NOTE — Telephone Encounter (Signed)
Attempted to call Lemar Bakos to schedule paramedicine visit- unable to leave a message. Will follow up.   [name], EMT-Paramedic [phone] 09/19/2022

## 2022-09-20 ENCOUNTER — Other Ambulatory Visit (HOSPITAL_COMMUNITY): Payer: Self-pay

## 2022-09-20 ENCOUNTER — Other Ambulatory Visit (HOSPITAL_COMMUNITY): Payer: Self-pay | Admitting: Cardiology

## 2022-09-20 DIAGNOSIS — I5022 Chronic systolic (congestive) heart failure: Secondary | ICD-10-CM

## 2022-09-20 NOTE — Progress Notes (Signed)
 Orders placed for labs

## 2022-09-20 NOTE — Progress Notes (Signed)
Paramedicine Encounter    Patient ID: Bruce [name], male    DOB: 02-21-1946, 77 y.o.   MRN: [id number]   Complaints- shortness of breath   Assessment- CAOX4, warm and dry seated on the couch, short of breath not using oxygen, recently smoked cigarettes before my arrival, no lower leg swelling, some wheezing in the upper left lobe.   Compliance with meds- missed two evening doses, one noon and one morning dose.   Pill box filled- for one week   Refills needed- losartan, requip   Meds changes since last visit- none     Social changes- none   Arrived for home visit for [facility] who was seated on the couch A&O, warm and dry, complaining of shortness of breath, no lower leg swelling, lung sounds noted to have some upper left wheezing. He admits to using his inhalers, vitals obtained. He is not wearing oxygen at present. A new order was placed for portable oxygen by [facility] in [place] on 7/9. I advised Mr. Aiken that he should reach out to confirm this order and for him to follow up with them to get his portable oxygen. He agreed with plan.   While filling his pill box he reports he has been taking what he thought was xanax which was actually losartan as needed. He takes losartan nightly so he was doubling up and he admits this happened 4 times over the last week. I educated him on his medications and he expressed he doesn't know what his medications are for and why or what he takes them for despite education. I think he may benefit from bubble packs at some point to decrease the possibility of him taking extra or unneeded medications. I will discuss with him at next visit.    We discussed upcoming appointments. We discussed HF management and agreed to visit in one week. Home visit complete.    BP 130/70   Pulse 74   Resp 16   Wt 162 lb (73.5 kg)   SpO2 94%   BMI 21.37 kg/m  Weight yesterday-- didn't weigh  Last visit weight--162lbs   [name],  EMT-Paramedic [phone]  ACTION: Home visit completed    Patient Care Team: [name], NP as PCP - General (Nurse Practitioner) Laurier Nancy, MD as PCP - Cardiology (Cardiology)  Patient Active Problem List   Diagnosis Date Noted   MDD (major depressive disorder), recurrent episode, moderate (HCC) 09/01/2022   Chronic systolic heart failure (HCC) 05/04/2022   Pulmonary hypertension, unspecified (HCC) 04/14/2022   Acute systolic HF (heart failure) (HCC) 04/13/2022   Pulmonary embolism (HCC) 04/09/2022   Lactic acidosis 04/09/2022   Hypokalemia 04/09/2022   PAF (paroxysmal atrial fibrillation) (HCC)    Syncope 12/31/2018   Intracranial bleed (HCC) 12/31/2018   Contusion of frontal lobe with loss of consciousness of 30 minutes or less, unspecified laterality, initial encounter (HCC)    NSTEMI (non-ST elevated myocardial infarction) (HCC) 10/10/2018   Recurrent syncope 04/28/2018   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   Visual hallucination 03/18/2015   Auditory hallucination 03/18/2015   Limb ischemia 11/18/2013   Blunt trauma of lower leg 10/08/2013   Acute blood loss anemia 10/08/2013   Anxiety disorder 10/08/2013   Dyslipidemia 10/08/2013   GERD (gastroesophageal reflux disease) 10/08/2013   History of MI (myocardial infarction) 10/08/2013   Anticoagulated 10/08/2013   Hypertension    CAD (coronary artery disease) 08/16/2010   Edema 08/16/2010   COPD (chronic obstructive pulmonary disease) (HCC) 08/16/2010  DVT of leg (deep venous thrombosis) (HCC) 08/16/2010   Leg cramps 08/16/2010    Current Outpatient Medications:    albuterol (PROVENTIL HFA;VENTOLIN HFA) 108 (90 Base) MCG/ACT inhaler, Inhale 2 puffs into the lungs every 6 (six) hours as needed for wheezing or shortness of breath., Disp: 1 Inhaler, Rfl: 2   ALPRAZolam (XANAX) 0.25 MG tablet, Take 1 tablet (0.25 mg total) by mouth daily as needed for anxiety., Disp: 30 tablet, Rfl: 0   amiodarone (PACERONE) 200  MG tablet, Take 0.5 tablets (100 mg total) by mouth daily., Disp: 15 tablet, Rfl: 11   apixaban (ELIQUIS) 5 MG TABS tablet, Take 1 tablet (5 mg total) by mouth 2 (two) times daily., Disp: 60 tablet, Rfl: 3   cetirizine (ZYRTEC) 10 MG tablet, Take 1 tablet (10 mg total) by mouth daily., Disp: 30 tablet, Rfl: 11   cholecalciferol (VITAMIN D3) 25 MCG (1000 UNIT) tablet, Take 1,000 Units by mouth daily., Disp: , Rfl:    digoxin (LANOXIN) 0.125 MG tablet, Take 1 tablet (0.125 mg total) by mouth daily., Disp: 30 tablet, Rfl: 6   docusate sodium (COLACE) 100 MG capsule, Take 1 capsule (100 mg total) by mouth daily., Disp: 90 capsule, Rfl: 3   FARXIGA 10 MG TABS tablet, TAKE ONE TABLET BY MOUTH EVERY MORNING, Disp: 90 tablet, Rfl: 0   fluticasone (FLONASE) 50 MCG/ACT nasal spray, Place 1 spray into both nostrils daily., Disp: , Rfl:    lidocaine (LIDODERM) 5 %, Place 1 patch onto the skin daily. Remove & Discard patch within 12 hours or as directed by MD, Disp: 30 patch, Rfl: 11   losartan (COZAAR) 25 MG tablet, Take 1 tablet (25 mg total) by mouth at bedtime., Disp: 90 tablet, Rfl: 1   Multiple Vitamin (MULTIVITAMIN) tablet, Take 1 tablet by mouth daily. For Men, Disp: , Rfl:    nitroGLYCERIN (NITROSTAT) 0.4 MG SL tablet, Place 0.4 mg under the tongue every 5 (five) minutes as needed for chest pain., Disp: , Rfl:    Omega-3 Fatty Acids (FISH OIL PO), Take 1,000 mg by mouth every evening., Disp: , Rfl:    omeprazole (PRILOSEC) 40 MG capsule, Take 40 mg by mouth daily., Disp: , Rfl:    ranolazine (RANEXA) 500 MG 12 hr tablet, Take 1 tablet (500 mg total) by mouth 2 (two) times daily., Disp: 60 tablet, Rfl: 6   rOPINIRole (REQUIP) 1 MG tablet, TAKE ONE TABLET BY MOUTH EVERY NIGHT AT BEDTIME FOR RESTLESS LEGS (Patient taking differently: Take 1 mg by mouth at bedtime.), Disp: 30 tablet, Rfl: 3   rosuvastatin (CRESTOR) 10 MG tablet, Take 1 tablet (10 mg total) by mouth daily., Disp: 30 tablet, Rfl: 6    sildenafil (REVATIO) 20 MG tablet, Take 1 tablet (20 mg total) by mouth 3 (three) times daily., Disp: 90 tablet, Rfl: 6   Tiotropium Bromide-Olodaterol (STIOLTO RESPIMAT) 2.5-2.5 MCG/ACT AERS, Inhale 2 puffs into the lungs daily., Disp: 60 each, Rfl: 12 Allergies  Allergen Reactions   Adhesive [Tape] Other (See Comments)    After right leg fracture surgery, pt developed a large blister where tape was applied to his right leg. OK to use paper tape.   Imdur [Isosorbide Dinitrate] Other (See Comments)    hallucinations   Singulair [Montelukast Sodium] Other (See Comments)    Hallucinations      Social History   Socioeconomic History   Marital status: Single    Spouse name: Not on file   Number of children: 2  Years of education: Not on file   Highest education level: 6th grade  Occupational History   Not on file  Tobacco Use   Smoking status: Former    Current packs/day: 0.00    Average packs/day: 0.5 packs/day for 52.0 years (26.0 ttl pk-yrs)    Types: Cigarettes    Start date: 07/05/1970    Quit date: 07/05/2022    Years since quitting: 0.2   Smokeless tobacco: Never  Vaping Use   Vaping status: Never Used  Substance and Sexual Activity   Alcohol use: No    Alcohol/week: 0.0 standard drinks of alcohol    Comment: Stopped 2009   Drug use: No   Sexual activity: Not Currently  Other Topics Concern   Not on file  Social History Narrative   ** Merged History Encounter **       Social Determinants of Health   Financial Resource Strain: High Risk (04/12/2022)   Overall Financial Resource Strain (CARDIA)    Difficulty of Paying Living Expenses: Hard  Food Insecurity: Food Insecurity Present (08/03/2022)   Hunger Vital Sign    Worried About Running Out of Food in the Last Year: Sometimes true    Ran Out of Food in the Last Year: Sometimes true  Transportation Needs: No Transportation Needs (05/23/2022)   PRAPARE - Administrator, Civil Service (Medical): No     Lack of Transportation (Non-Medical): No  Physical Activity: Inactive (02/05/2017)   Exercise Vital Sign    Days of Exercise per Week: 0 days    Minutes of Exercise per Session: 0 min  Stress: Stress Concern Present (02/05/2017)   [employer] of Occupational Health - Occupational Stress Questionnaire    Feeling of Stress : Rather much  Social Connections: Moderately Isolated (02/05/2017)   Social Connection and Isolation Panel [NHANES]    Frequency of Communication with Friends and Family: More than three times a week    Frequency of Social Gatherings with Friends and Family: Once a week    Attends Religious Services: Never    Database administrator or Organizations: No    Attends Banker Meetings: Never    Marital Status: Divorced  Catering manager Violence: Not At Risk (04/14/2022)   Humiliation, Afraid, Rape, and Kick questionnaire    Fear of Current or Ex-Partner: No    Emotionally Abused: No    Physically Abused: No    Sexually Abused: No    Physical Exam      Future Appointments  Date Time Provider Department Center  10/03/2022 10:00 AM [name], NP AMA-AMA None  01/15/2023  2:00 PM [facility] ECHO OP 1 [facility]  01/15/2023  3:00 PM [facility] PA/NP [facility] None

## 2022-09-21 ENCOUNTER — Telehealth (HOSPITAL_COMMUNITY): Payer: Self-pay

## 2022-09-21 ENCOUNTER — Other Ambulatory Visit (HOSPITAL_COMMUNITY): Payer: Self-pay | Admitting: Cardiology

## 2022-09-21 NOTE — Telephone Encounter (Signed)
Called in Mr. Ellithorpe refills for the following to [facility]-  -Losartan -Requip   [name], EMT-Paramedic [phone] 09/21/2022

## 2022-09-25 ENCOUNTER — Telehealth (HOSPITAL_COMMUNITY): Payer: Self-pay

## 2022-09-25 ENCOUNTER — Ambulatory Visit (HOSPITAL_COMMUNITY)
Admission: RE | Admit: 2022-09-25 | Discharge: 2022-09-25 | Disposition: A | Discharge status: Home / Self Care | Payer: 59 | Source: Ambulatory Visit | Attending: Cardiology | Admitting: Cardiology

## 2022-09-25 ENCOUNTER — Other Ambulatory Visit (HOSPITAL_COMMUNITY): Payer: Self-pay

## 2022-09-25 ENCOUNTER — Telehealth: Payer: Self-pay | Admitting: Nurse Practitioner

## 2022-09-25 DIAGNOSIS — I5022 Chronic systolic (congestive) heart failure: Secondary | ICD-10-CM | POA: Insufficient documentation

## 2022-09-25 LAB — BASIC METABOLIC PANEL
Anion gap: 8 (ref 5–15)
BUN: 9 mg/dL (ref 8–23)
CO2: 23 mmol/L (ref 22–32)
Calcium: 9.3 mg/dL (ref 8.9–10.3)
Chloride: 106 mmol/L (ref 98–111)
Creatinine, Ser: 1.14 mg/dL (ref 0.61–1.24)
GFR, Estimated: >60 mL/min (ref >60)
Glucose, Bld: 108 mg/dL — ABNORMAL HIGH (ref 70–99)
Potassium: 3.1 mmol/L — ABNORMAL LOW (ref 3.5–5.1)
Sodium: 137 mmol/L (ref 135–145)

## 2022-09-25 NOTE — Telephone Encounter (Signed)
Called Bruce [name] and spoke to his friend shirley who helps with his appointments and care to notify her of his results from labs and instructions. She understood plans.   Result Notes   Dolores Patty, MD 09/25/2022  3:00 PM EDT Back to Top    Give potassium 80 meq x 1 then start k dur 40 daily. Repeat BMET 1 week     [name], EMT-Paramedic [phone] 09/25/2022

## 2022-09-25 NOTE — Telephone Encounter (Signed)
Left message for Missael reminding him of his labs today at 1230. I will meet him for a med rec as well.   [name], EMT-Paramedic [phone] 09/25/2022

## 2022-09-25 NOTE — Telephone Encounter (Signed)
Met with patient today in HF clinic and I advised him to reach out to [employer] to see if his portable o2 order went through as he has not obtained same yet. [employer] is requiring a letter from PCP indicating why he cannot perform walk test- however [facility] reported to me he didn't have to complete same. I will forward to [employer].   [name], EMT-Paramedic [phone] 09/25/2022

## 2022-09-25 NOTE — Telephone Encounter (Signed)
Patient's friend, [name], called and said that the pulmonologist said that [facility] will need to write a letter excusing the patient from doing any exercises that will qualify him for portable oxygen. Please advise.

## 2022-09-25 NOTE — Progress Notes (Signed)
Paramedicine Encounter    Patient ID: Bruce [name], male    DOB: [birth year], 77 y.o.   MRN: [id number]  Met with Bruce [name] in the clinic today while he had his labs drawn to review and fill pill boxes. I filled two weeks for him of his medications. He has no complaints today other than continued shortness of breath. I advised him to reach out to [employer] to see if his portable o2 order went through. [employer] is requiring a letter from PCP indicating why he cannot perform walk test- however  [facility]. reported to me he didn't have to complete same. I will forward to [employer]. once again. Appointments reviewed. Labs obtained and results indicate he needs to take potassium 40 daily with a one time dose of 80 once he gets it from pharmacy. He agreed with plan. Visit complete. I will follow up in two weeks pending any further needs arising.   [name], EMT-Paramedic [phone] 09/25/2022     ACTION: Home visit completed

## 2022-09-26 ENCOUNTER — Other Ambulatory Visit: Payer: Self-pay | Admitting: Family

## 2022-09-26 ENCOUNTER — Other Ambulatory Visit: Payer: Self-pay

## 2022-09-26 ENCOUNTER — Other Ambulatory Visit (HOSPITAL_COMMUNITY): Payer: Self-pay | Admitting: Internal Medicine

## 2022-09-26 DIAGNOSIS — J439 Emphysema, unspecified: Secondary | ICD-10-CM

## 2022-09-26 NOTE — Telephone Encounter (Signed)
Dr. Aundria Rud saw the patient on 07/20/22 and his note states 90% on RA  Patient was trended on room air, his SpO2 dropped to 85%, improved on 3 liters of [place]. We never ordered anything for this patient

## 2022-09-26 NOTE — Telephone Encounter (Signed)
Please see 09/12/2022 phone not e from [facility]. Received approval from Dr. Jamse Mead to order POC. Order was placed within 7/9 telephone encounter but did not flow over to [facility] in basket. Order is still showing as pending within patient's chart. Order has been re placed and sent to [employer].  Routing to [employer] as an [employer]

## 2022-09-26 NOTE — Telephone Encounter (Signed)
Bruce [name], can you assist with this?

## 2022-10-02 DIAGNOSIS — J9601 Acute respiratory failure with hypoxia: Secondary | ICD-10-CM | POA: Diagnosis not present

## 2022-10-03 ENCOUNTER — Ambulatory Visit: Payer: 59 | Admitting: Cardiology

## 2022-10-05 DIAGNOSIS — J449 Chronic obstructive pulmonary disease, unspecified: Secondary | ICD-10-CM | POA: Diagnosis not present

## 2022-10-05 DIAGNOSIS — J9601 Acute respiratory failure with hypoxia: Secondary | ICD-10-CM | POA: Diagnosis not present

## 2022-10-05 DIAGNOSIS — I2699 Other pulmonary embolism without acute cor pulmonale: Secondary | ICD-10-CM | POA: Diagnosis not present

## 2022-10-05 DIAGNOSIS — I4891 Unspecified atrial fibrillation: Secondary | ICD-10-CM | POA: Diagnosis not present

## 2022-10-05 DIAGNOSIS — I2510 Atherosclerotic heart disease of native coronary artery without angina pectoris: Secondary | ICD-10-CM | POA: Diagnosis not present

## 2022-10-06 ENCOUNTER — Encounter (HOSPITAL_COMMUNITY): Payer: Self-pay

## 2022-10-10 ENCOUNTER — Telehealth (HOSPITAL_COMMUNITY): Payer: Self-pay | Admitting: Cardiology

## 2022-10-10 ENCOUNTER — Other Ambulatory Visit (HOSPITAL_COMMUNITY): Payer: Self-pay

## 2022-10-10 DIAGNOSIS — I5022 Chronic systolic (congestive) heart failure: Secondary | ICD-10-CM

## 2022-10-10 MED ORDER — POTASSIUM CHLORIDE CRYS ER 20 MEQ PO TBCR: EXTENDED_RELEASE_TABLET | ORAL | 3 refills | Status: DC

## 2022-10-10 NOTE — Telephone Encounter (Signed)
Heather called to report pt received letter regarding labs and results reviewed during paramed home visit  Meds sent to pharmacy and repeat labs scheduled

## 2022-10-10 NOTE — Progress Notes (Signed)
Paramedicine Encounter    Patient ID: Bruce [name], male    DOB: [birth year], 77 y.o.   MRN: [id number]   Complaints- Shortness of breath- leg pain.   Assessment- CAOX4, warm and dry seated on the couch short of breath,   Compliance with meds- missed one week and 4 days of medications   Pill box filled- for one week   Refills needed- Losartan   Meds changes since last visit- Potassium added- he never picked up or started.    Social changes- He did get his portable oxygen.     VISIT SUMMARY- Arrived for home visit for [facility] who was seated in the living room alert and oriented reported to be feeling okay just short of breath on exertion which is normal for him and an episode of chest pain a week ago. He did not call to notify me or seek further care.  He did obtain his portable oxygen and has been using it some but isn't 100% comfortable with it- I advised him to go to [employer] or his Pulmonary DR to have them review machine with him. I went over it the best I could. Vitals and assessment obtained. [name] missed a week and a half of medications telling me he took them but clearly in his pill box they are still placed as I placed them two weeks ago. I expressed to him the need for him to comply with medications properly if he wants paramedicine to still be involved in his care. He expressed he has been taking them- I elected to not discuss any further but advised he needed to take them as placed in the pill box. He also was started on Potassium but the prescription never got sent in by HF clinic- I called them to send in same to [facility]. I advised him once he picks it up to call me and I will give him detailed instructions on how to take same. He never contacted me as of 1800. I will follow up on same. We reviewed upcoming appointments and confirmed same. Home visit complete. I will see Bruce [name] in one week.   BP (!) 150/82   Pulse 86   Resp 16   Wt 162 lb (73.5 kg)   SpO2 (!) 89%    BMI 21.37 kg/m  Weight yesterday-- DNW  Last visit weight-- 162lbs     ACTION: Home visit completed     Patient Care Team: [name], NP as PCP - General (Nurse Practitioner) Laurier Nancy, MD as PCP - Cardiology (Cardiology)  Patient Active Problem List   Diagnosis Date Noted   MDD (major depressive disorder), recurrent episode, moderate (HCC) 09/01/2022   Chronic systolic heart failure (HCC) 05/04/2022   Pulmonary hypertension, unspecified (HCC) 04/14/2022   Acute systolic HF (heart failure) (HCC) 04/13/2022   Pulmonary embolism (HCC) 04/09/2022   Lactic acidosis 04/09/2022   Hypokalemia 04/09/2022   PAF (paroxysmal atrial fibrillation) (HCC)    Syncope 12/31/2018   Intracranial bleed (HCC) 12/31/2018   Contusion of frontal lobe with loss of consciousness of 30 minutes or less, unspecified laterality, initial encounter (HCC)    NSTEMI (non-ST elevated myocardial infarction) (HCC) 10/10/2018   Recurrent syncope 04/28/2018   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   Visual hallucination 03/18/2015   Auditory hallucination 03/18/2015   Limb ischemia 11/18/2013   Blunt trauma of lower leg 10/08/2013   Acute blood loss anemia 10/08/2013   Anxiety disorder 10/08/2013   Dyslipidemia 10/08/2013   GERD (gastroesophageal reflux disease)  10/08/2013   History of MI (myocardial infarction) 10/08/2013   Anticoagulated 10/08/2013   Hypertension    CAD (coronary artery disease) 08/16/2010   Edema 08/16/2010   COPD (chronic obstructive pulmonary disease) (HCC) 08/16/2010   DVT of leg (deep venous thrombosis) (HCC) 08/16/2010   Leg cramps 08/16/2010    Current Outpatient Medications:    albuterol (PROVENTIL HFA;VENTOLIN HFA) 108 (90 Base) MCG/ACT inhaler, Inhale 2 puffs into the lungs every 6 (six) hours as needed for wheezing or shortness of breath., Disp: 1 Inhaler, Rfl: 2   ALPRAZolam (XANAX) 0.25 MG tablet, TAKE 1 TABLET BY MOUTH ONCE A DAY AS NEEDED FOR ANXIETY, Disp: 30  tablet, Rfl: 0   amiodarone (PACERONE) 200 MG tablet, Take 0.5 tablets (100 mg total) by mouth daily., Disp: 15 tablet, Rfl: 11   apixaban (ELIQUIS) 5 MG TABS tablet, TAKE ONE TABLET (5 MG TOTAL) BY MOUTH TWO (TWO) TIMES DAILY., Disp: 60 tablet, Rfl: 11   cetirizine (ZYRTEC) 10 MG tablet, Take 1 tablet (10 mg total) by mouth daily., Disp: 30 tablet, Rfl: 11   cholecalciferol (VITAMIN D3) 25 MCG (1000 UNIT) tablet, Take 1,000 Units by mouth daily., Disp: , Rfl:    digoxin (LANOXIN) 0.125 MG tablet, Take 1 tablet (0.125 mg total) by mouth daily., Disp: 30 tablet, Rfl: 6   docusate sodium (COLACE) 100 MG capsule, Take 1 capsule (100 mg total) by mouth daily., Disp: 90 capsule, Rfl: 3   FARXIGA 10 MG TABS tablet, TAKE ONE TABLET BY MOUTH EVERY MORNING, Disp: 90 tablet, Rfl: 0   fluticasone (FLONASE) 50 MCG/ACT nasal spray, Place 1 spray into both nostrils daily., Disp: , Rfl:    lidocaine (LIDODERM) 5 %, Place 1 patch onto the skin daily. Remove & Discard patch within 12 hours or as directed by MD, Disp: 30 patch, Rfl: 11   losartan (COZAAR) 25 MG tablet, TAKE 0.5 TABLETS (12.5 MG TOTAL) BY MOUTH AT BEDTIME., Disp: 45 tablet, Rfl: 3   Multiple Vitamin (MULTIVITAMIN) tablet, Take 1 tablet by mouth daily. For Men, Disp: , Rfl:    nitroGLYCERIN (NITROSTAT) 0.4 MG SL tablet, Place 0.4 mg under the tongue every 5 (five) minutes as needed for chest pain., Disp: , Rfl:    Omega-3 Fatty Acids (FISH OIL PO), Take 1,000 mg by mouth every evening., Disp: , Rfl:    omeprazole (PRILOSEC) 40 MG capsule, Take 40 mg by mouth daily., Disp: , Rfl:    potassium chloride SA (KLOR-CON M) 20 MEQ tablet, Take 4 tablets (80 mEq total) by mouth daily for 1 day, THEN 2 tablets (40 mEq total) daily., Disp: 64 tablet, Rfl: 3   ranolazine (RANEXA) 500 MG 12 hr tablet, Take 1 tablet (500 mg total) by mouth 2 (two) times daily., Disp: 60 tablet, Rfl: 6   rOPINIRole (REQUIP) 1 MG tablet, TAKE ONE TABLET BY MOUTH EVERY NIGHT AT BEDTIME  FOR RESTLESS LEGS (Patient taking differently: Take 1 mg by mouth at bedtime.), Disp: 30 tablet, Rfl: 3   rosuvastatin (CRESTOR) 10 MG tablet, Take 1 tablet (10 mg total) by mouth daily., Disp: 30 tablet, Rfl: 6   sildenafil (REVATIO) 20 MG tablet, Take 1 tablet (20 mg total) by mouth 3 (three) times daily., Disp: 90 tablet, Rfl: 6   Tiotropium Bromide-Olodaterol (STIOLTO RESPIMAT) 2.5-2.5 MCG/ACT AERS, Inhale 2 puffs into the lungs daily., Disp: 60 each, Rfl: 12 Allergies  Allergen Reactions   Adhesive [Tape] Other (See Comments)    After right leg fracture surgery, pt  developed a large blister where tape was applied to his right leg. OK to use paper tape.   Imdur [Isosorbide Dinitrate] Other (See Comments)    hallucinations   Singulair [Montelukast Sodium] Other (See Comments)    Hallucinations      Social History   Socioeconomic History   Marital status: Single    Spouse name: Not on file   Number of children: 2   Years of education: Not on file   Highest education level: 6th grade  Occupational History   Not on file  Tobacco Use   Smoking status: Former    Current packs/day: 0.00    Average packs/day: 0.5 packs/day for 52.0 years (26.0 ttl pk-yrs)    Types: Cigarettes    Start date: 07/05/1970    Quit date: 07/05/2022    Years since quitting: 0.2   Smokeless tobacco: Never  Vaping Use   Vaping status: Never Used  Substance and Sexual Activity   Alcohol use: No    Alcohol/week: 0.0 standard drinks of alcohol    Comment: Stopped 2009   Drug use: No   Sexual activity: Not Currently  Other Topics Concern   Not on file  Social History Narrative   ** Merged History Encounter **       Social Determinants of Health   Financial Resource Strain: High Risk (04/12/2022)   Overall Financial Resource Strain (CARDIA)    Difficulty of Paying Living Expenses: Hard  Food Insecurity: Food Insecurity Present (08/03/2022)   Hunger Vital Sign    Worried About Running Out of Food in  the Last Year: Sometimes true    Ran Out of Food in the Last Year: Sometimes true  Transportation Needs: No Transportation Needs (05/23/2022)   PRAPARE - Administrator, Civil Service (Medical): No    Lack of Transportation (Non-Medical): No  Physical Activity: Inactive (02/05/2017)   Exercise Vital Sign    Days of Exercise per Week: 0 days    Minutes of Exercise per Session: 0 min  Stress: Stress Concern Present (02/05/2017)   [employer] of Occupational Health - Occupational Stress Questionnaire    Feeling of Stress : Rather much  Social Connections: Moderately Isolated (02/05/2017)   Social Connection and Isolation Panel [NHANES]    Frequency of Communication with Friends and Family: More than three times a week    Frequency of Social Gatherings with Friends and Family: Once a week    Attends Religious Services: Never    Database administrator or Organizations: No    Attends Banker Meetings: Never    Marital Status: Divorced  Catering manager Violence: Not At Risk (04/14/2022)   Humiliation, Afraid, Rape, and Kick questionnaire    Fear of Current or Ex-Partner: No    Emotionally Abused: No    Physically Abused: No    Sexually Abused: No    Physical Exam      Future Appointments  Date Time Provider Department Center  10/17/2022 11:00 AM [name], NP AMA-AMA None  10/20/2022 12:00 PM [facility] LAB MC-[facility] None  10/25/2022 11:30 AM OPIC-CT OPIC-CT OPIC-Outpati  01/15/2023  2:00 PM MC ECHO OP 1 MC-ECHOL[facility]  01/15/2023  3:00 PM MC-[facility] PA/NP MC-HVSC None

## 2022-10-14 ENCOUNTER — Other Ambulatory Visit: Payer: Self-pay | Admitting: Nurse Practitioner

## 2022-10-17 ENCOUNTER — Ambulatory Visit: Payer: 59 | Admitting: Cardiology

## 2022-10-18 ENCOUNTER — Other Ambulatory Visit (HOSPITAL_COMMUNITY): Payer: Self-pay | Admitting: Emergency Medicine
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
## 2022-10-19 ENCOUNTER — Telehealth (HOSPITAL_COMMUNITY): Payer: Self-pay | Admitting: Emergency Medicine

## 2022-10-19 ENCOUNTER — Other Ambulatory Visit: Payer: Self-pay

## 2022-10-19 MED ORDER — ROPINIROLE HCL 1 MG PO TABS: 1.0000 mg | ORAL_TABLET | Freq: Two times a day (BID) | ORAL | 3 refills | Status: DC
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
## 2022-10-19 NOTE — Telephone Encounter (Signed)
[name] did not have his updated dose of potassium until he had his first full dose today.   Do you mind rescheduling his lab appointment to a later time to include the dose change?

## 2022-10-20 ENCOUNTER — Other Ambulatory Visit (HOSPITAL_COMMUNITY): Payer: 59

## 2022-10-20 NOTE — Telephone Encounter (Signed)
Appt rescheduled 8/21 @ 12330

## 2022-10-23 ENCOUNTER — Telehealth (HOSPITAL_COMMUNITY): Payer: Self-pay

## 2022-10-23 NOTE — Telephone Encounter (Addendum)
Called in the following medications to [facility]-  Vit D Multivitamin Fish Oil   -These three are over the counter and [place] advised they can fill all three for a 100 day supply for $70. Trying to confirm with Adonay if he wants them filled this way or not due to cost. Myself and Thayer Ohm at [facility] will be following up with [place] about same.    Requip (picked up on 10/20/22)  [name], EMT-Paramedic [phone] 10/23/2022

## 2022-10-23 NOTE — Telephone Encounter (Signed)
Left message for Bruce [name] to make him aware of his upcoming lab appointment on Wednesday 8/21 at 1230. I also texted his friend [name] to have her help remind him.   [name], EMT-Paramedic [phone] 10/23/2022

## 2022-10-24 ENCOUNTER — Telehealth (HOSPITAL_COMMUNITY): Payer: Self-pay

## 2022-10-24 NOTE — Addendum Note (Signed)
Addended by: Theresia Bough on: 10/24/2022 12:30 PM   Modules accepted: Orders

## 2022-10-24 NOTE — Telephone Encounter (Signed)
Spoke to Pinson to remind him of his upcoming appointments tomorrow and he requested getting his labs drawn at [facility] since he will be there already getting his CT Scan done- I contacted HF triage and they we're able to move his labs to [facility]. Jabari made aware and he is agreeable. Call complete.   [name], EMT-Paramedic [phone] 10/24/2022

## 2022-10-25 ENCOUNTER — Telehealth (HOSPITAL_COMMUNITY): Payer: Self-pay | Admitting: Licensed Clinical Social Worker

## 2022-10-25 ENCOUNTER — Ambulatory Visit
Admission: RE | Admit: 2022-10-25 | Discharge: 2022-10-25 | Disposition: A | Discharge status: Home / Self Care | Payer: 59 | Source: Ambulatory Visit | Attending: Student in an Organized Health Care Education/Training Program | Admitting: Student in an Organized Health Care Education/Training Program

## 2022-10-25 ENCOUNTER — Other Ambulatory Visit (HOSPITAL_COMMUNITY): Payer: 59

## 2022-10-25 DIAGNOSIS — J439 Emphysema, unspecified: Secondary | ICD-10-CM | POA: Diagnosis not present

## 2022-10-25 DIAGNOSIS — R911 Solitary pulmonary nodule: Secondary | ICD-10-CM | POA: Diagnosis not present

## 2022-10-25 DIAGNOSIS — J479 Bronchiectasis, uncomplicated: Secondary | ICD-10-CM | POA: Diagnosis not present

## 2022-10-25 DIAGNOSIS — I700 Atherosclerosis of aorta: Secondary | ICD-10-CM | POA: Diagnosis not present

## 2022-10-25 DIAGNOSIS — J90 Pleural effusion, not elsewhere classified: Secondary | ICD-10-CM | POA: Insufficient documentation

## 2022-10-25 DIAGNOSIS — R918 Other nonspecific abnormal finding of lung field: Secondary | ICD-10-CM | POA: Diagnosis not present

## 2022-10-25 NOTE — Telephone Encounter (Signed)
Paramedic had mentioned pt is interested in finding alternative housing.  CSW attempted to reach out to pt to discuss- unable to reach or leave VM.  Will continue to attempt contact  Burna Sis, LCSW Clinical Social Worker [facility] Desk#: [phone] Cell#: [phone]

## 2022-11-01 ENCOUNTER — Telehealth (HOSPITAL_COMMUNITY): Payer: Self-pay | Admitting: Emergency Medicine

## 2022-11-01 NOTE — Telephone Encounter (Signed)
Spoke with [name], Huzaifa's friend to remind him of tomorrow's appointment with [facility]. Phone call was followed up with a text for reference.   [name] advised that she would tell Encarnacion when she returned home.   Will follow up in the home for home visit tomorrow for an in-person reminder as well   [name] EMT-P Community Paramedic  [phone]

## 2022-11-02 ENCOUNTER — Encounter: Payer: Self-pay | Admitting: Family

## 2022-11-02 ENCOUNTER — Ambulatory Visit
Admission: RE | Admit: 2022-11-02 | Discharge: 2022-11-02 | Disposition: A | Discharge status: Home / Self Care | Payer: 59 | Source: Ambulatory Visit | Attending: Family | Admitting: Family

## 2022-11-02 ENCOUNTER — Ambulatory Visit (INDEPENDENT_AMBULATORY_CARE_PROVIDER_SITE_OTHER): Payer: 59 | Admitting: Family

## 2022-11-02 ENCOUNTER — Ambulatory Visit
Admission: RE | Admit: 2022-11-02 | Discharge: 2022-11-02 | Disposition: A | Discharge status: Home / Self Care | Payer: 59 | Attending: Family | Admitting: Family

## 2022-11-02 ENCOUNTER — Other Ambulatory Visit (HOSPITAL_COMMUNITY): Payer: Self-pay | Admitting: Emergency Medicine

## 2022-11-02 VITALS — BP 140/70 | HR 95 | Ht 73.0 in | Wt 157.8 lb

## 2022-11-02 DIAGNOSIS — E785 Hyperlipidemia, unspecified: Secondary | ICD-10-CM | POA: Diagnosis not present

## 2022-11-02 DIAGNOSIS — M25571 Pain in right ankle and joints of right foot: Secondary | ICD-10-CM | POA: Diagnosis not present

## 2022-11-02 DIAGNOSIS — R946 Abnormal results of thyroid function studies: Secondary | ICD-10-CM

## 2022-11-02 DIAGNOSIS — K219 Gastro-esophageal reflux disease without esophagitis: Secondary | ICD-10-CM

## 2022-11-02 DIAGNOSIS — R7303 Prediabetes: Secondary | ICD-10-CM

## 2022-11-02 DIAGNOSIS — E538 Deficiency of other specified B group vitamins: Secondary | ICD-10-CM

## 2022-11-02 DIAGNOSIS — M7731 Calcaneal spur, right foot: Secondary | ICD-10-CM | POA: Diagnosis not present

## 2022-11-02 DIAGNOSIS — Z9181 History of falling: Secondary | ICD-10-CM | POA: Diagnosis not present

## 2022-11-02 DIAGNOSIS — I10 Essential (primary) hypertension: Secondary | ICD-10-CM | POA: Diagnosis not present

## 2022-11-02 DIAGNOSIS — F331 Major depressive disorder, recurrent, moderate: Secondary | ICD-10-CM | POA: Diagnosis not present

## 2022-11-02 DIAGNOSIS — E559 Vitamin D deficiency, unspecified: Secondary | ICD-10-CM | POA: Diagnosis not present

## 2022-11-02 NOTE — Progress Notes (Signed)
Paramedicine Encounter    Patient ID: Bruce [name], male    DOB: [birth year], 77 y.o.   MRN: [id number]   Complaints -  ankle pain, dizziness at rest, continued exertional shortness of breath   Assessment - clear lung sounds with no noted edema.   Compliance with meds - missed 6x morning medication doses, 7x noon doses, and 5x evening doses  Pill box filled - for 2x weeks   Refills needed - Losartan   Meds changes since last visit - none     Social changes - needs new housing situation, needs assistance obtaining a phone, needs assistance with his check, pt reports $176 coming out of it every month and he doesn't know why.    VISIT SUMMARY**  Met with Bruce [name] in his home today. Pt reported that he had continued concerns with his current housing situation, gesturing to the bugs crawling across the couch. Pt was informed that [place] reached out, however due to pt not having a phone, there is a lot of information getting lost in translation. I spoke with [name] to confirm todays visit, and when I arrived, everyone was asleep and unprepared. Pt was not aware that I was coming today. Pt reports pain in his ankle pain and dizziness at sitting. Vitals within normal limits. Pt was advised to address these concerns with his PCP at his appointment today. Pt agreed and advised that he would. Pill box was filled outside due to the status of the home. Pill box was filled for 2x weeks, but due to non-compliance, may consider moving to a 1x/week visit. Will follow up with pt next week on same. Pt was informed of all upcoming appointments and given a note to reference as a reminder. Pt was notably hypoxic after sitting outside for the duration of our meeting. Pt did not appear tachypneic and denied any shortness of breath. Pt advised that he's supposed to have his oxygen on all the time, but he wanted some fresh air. Pt was advised to put it back on, as his oxygen was low. Pt understood and agreed. Will  follow up with pt next week as stated.   BP 114/60   Pulse 72   Resp 16   Wt 162 lb (73.5 kg)   SpO2 (!) 86% Comment: on room air, no nebulizers taken this morning and prescribed 24/7 O2.  BMI 21.37 kg/m  Weight yesterday-162 lbs Last visit weight-162 lbs    [name] EMT-P Community Paramedic  [phone]    ACTION: Home visit completed     Patient Care Team: [name], NP (Inactive) as PCP - General (Nurse Practitioner) Laurier Nancy, MD as PCP - Cardiology (Cardiology)  Patient Active Problem List   Diagnosis Date Noted   MDD (major depressive disorder), recurrent episode, moderate (HCC) 09/01/2022   Chronic systolic heart failure (HCC) 05/04/2022   Pulmonary hypertension, unspecified (HCC) 04/14/2022   Acute systolic HF (heart failure) (HCC) 04/13/2022   Pulmonary embolism (HCC) 04/09/2022   Lactic acidosis 04/09/2022   Hypokalemia 04/09/2022   PAF (paroxysmal atrial fibrillation) (HCC)    Syncope 12/31/2018   Intracranial bleed (HCC) 12/31/2018   Contusion of frontal lobe with loss of consciousness of 30 minutes or less, unspecified laterality, initial encounter (HCC)    NSTEMI (non-ST elevated myocardial infarction) (HCC) 10/10/2018   Recurrent syncope 04/28/2018   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   Visual hallucination 03/18/2015   Auditory hallucination 03/18/2015   Limb ischemia 11/18/2013   Blunt trauma of  lower leg 10/08/2013   Acute blood loss anemia 10/08/2013   Anxiety disorder 10/08/2013   Dyslipidemia 10/08/2013   GERD (gastroesophageal reflux disease) 10/08/2013   History of MI (myocardial infarction) 10/08/2013   Anticoagulated 10/08/2013   Hypertension    CAD (coronary artery disease) 08/16/2010   Edema 08/16/2010   COPD (chronic obstructive pulmonary disease) (HCC) 08/16/2010   DVT of leg (deep venous thrombosis) (HCC) 08/16/2010   Leg cramps 08/16/2010    Current Outpatient Medications:    albuterol (PROVENTIL HFA;VENTOLIN  HFA) 108 (90 Base) MCG/ACT inhaler, Inhale 2 puffs into the lungs every 6 (six) hours as needed for wheezing or shortness of breath., Disp: 1 Inhaler, Rfl: 2   ALPRAZolam (XANAX) 0.25 MG tablet, TAKE 1 TABLET BY MOUTH ONCE A DAY AS NEEDED FOR ANXIETY, Disp: 30 tablet, Rfl: 0   amiodarone (PACERONE) 200 MG tablet, Take 0.5 tablets (100 mg total) by mouth daily., Disp: 15 tablet, Rfl: 11   apixaban (ELIQUIS) 5 MG TABS tablet, TAKE ONE TABLET (5 MG TOTAL) BY MOUTH TWO (TWO) TIMES DAILY., Disp: 60 tablet, Rfl: 11   cetirizine (ZYRTEC) 10 MG tablet, Take 1 tablet (10 mg total) by mouth daily., Disp: 30 tablet, Rfl: 11   cholecalciferol (VITAMIN D3) 25 MCG (1000 UNIT) tablet, Take 1,000 Units by mouth daily., Disp: , Rfl:    digoxin (LANOXIN) 0.125 MG tablet, Take 1 tablet (0.125 mg total) by mouth daily., Disp: 30 tablet, Rfl: 6   FARXIGA 10 MG TABS tablet, TAKE ONE TABLET BY MOUTH EVERY MORNING, Disp: 90 tablet, Rfl: 0   fluticasone (FLONASE) 50 MCG/ACT nasal spray, BENDING HEAD FORWARD SPRAY ONE SPRAY INTO EACH NOSTRIL TWICE DAILY, Disp: 16 mL, Rfl: 5   lidocaine (LIDODERM) 5 %, Place 1 patch onto the skin daily. Remove & Discard patch within 12 hours or as directed by MD, Disp: 30 patch, Rfl: 11   losartan (COZAAR) 25 MG tablet, TAKE 0.5 TABLETS (12.5 MG TOTAL) BY MOUTH AT BEDTIME., Disp: 45 tablet, Rfl: 3   Multiple Vitamin (MULTIVITAMIN) tablet, Take 1 tablet by mouth daily. For Men, Disp: , Rfl:    nitroGLYCERIN (NITROSTAT) 0.4 MG SL tablet, Place 0.4 mg under the tongue every 5 (five) minutes as needed for chest pain., Disp: , Rfl:    Omega-3 Fatty Acids (FISH OIL PO), Take 1,000 mg by mouth every evening., Disp: , Rfl:    omeprazole (PRILOSEC) 40 MG capsule, Take 40 mg by mouth daily., Disp: , Rfl:    potassium chloride SA (KLOR-CON M) 20 MEQ tablet, Take 4 tablets (80 mEq total) by mouth daily for 1 day, THEN 2 tablets (40 mEq total) daily., Disp: 64 tablet, Rfl: 3   ranolazine (RANEXA) 500 MG 12  hr tablet, Take 1 tablet (500 mg total) by mouth 2 (two) times daily., Disp: 60 tablet, Rfl: 6   rosuvastatin (CRESTOR) 10 MG tablet, Take 1 tablet (10 mg total) by mouth daily., Disp: 30 tablet, Rfl: 6   sildenafil (REVATIO) 20 MG tablet, Take 1 tablet (20 mg total) by mouth 3 (three) times daily., Disp: 90 tablet, Rfl: 6   Tiotropium Bromide-Olodaterol (STIOLTO RESPIMAT) 2.5-2.5 MCG/ACT AERS, Inhale 2 puffs into the lungs daily., Disp: 60 each, Rfl: 12   docusate sodium (COLACE) 100 MG capsule, Take 1 capsule (100 mg total) by mouth daily. (Patient not taking: Reported on 10/18/2022), Disp: 90 capsule, Rfl: 3   rOPINIRole (REQUIP) 1 MG tablet, Take 1 tablet (1 mg total) by mouth in the morning  and at bedtime. (Patient not taking: Reported on 11/02/2022), Disp: 60 tablet, Rfl: 3 Allergies  Allergen Reactions   Adhesive [Tape] Other (See Comments)    After right leg fracture surgery, pt developed a large blister where tape was applied to his right leg. OK to use paper tape.   Imdur [Isosorbide Dinitrate] Other (See Comments)    hallucinations   Singulair [Montelukast Sodium] Other (See Comments)    Hallucinations      Social History   Socioeconomic History   Marital status: Single    Spouse name: Not on file   Number of children: 2   Years of education: Not on file   Highest education level: 6th grade  Occupational History   Not on file  Tobacco Use   Smoking status: Former    Current packs/day: 0.00    Average packs/day: 0.5 packs/day for 52.0 years (26.0 ttl pk-yrs)    Types: Cigarettes    Start date: 07/05/1970    Quit date: 07/05/2022    Years since quitting: 0.3   Smokeless tobacco: Never  Vaping Use   Vaping status: Never Used  Substance and Sexual Activity   Alcohol use: No    Alcohol/week: 0.0 standard drinks of alcohol    Comment: Stopped 2009   Drug use: No   Sexual activity: Not Currently  Other Topics Concern   Not on file  Social History Narrative   ** Merged  History Encounter **       Social Determinants of Health   Financial Resource Strain: High Risk (04/12/2022)   Overall Financial Resource Strain (CARDIA)    Difficulty of Paying Living Expenses: Hard  Food Insecurity: Food Insecurity Present (08/03/2022)   Hunger Vital Sign    Worried About Running Out of Food in the Last Year: Sometimes true    Ran Out of Food in the Last Year: Sometimes true  Transportation Needs: No Transportation Needs (05/23/2022)   PRAPARE - Administrator, Civil Service (Medical): No    Lack of Transportation (Non-Medical): No  Physical Activity: Inactive (02/05/2017)   Exercise Vital Sign    Days of Exercise per Week: 0 days    Minutes of Exercise per Session: 0 min  Stress: Stress Concern Present (02/05/2017)   [employer] of Occupational Health - Occupational Stress Questionnaire    Feeling of Stress : Rather much  Social Connections: Moderately Isolated (02/05/2017)   Social Connection and Isolation Panel [NHANES]    Frequency of Communication with Friends and Family: More than three times a week    Frequency of Social Gatherings with Friends and Family: Once a week    Attends Religious Services: Never    Database administrator or Organizations: No    Attends Banker Meetings: Never    Marital Status: Divorced  Catering manager Violence: Not At Risk (04/14/2022)   Humiliation, Afraid, Rape, and Kick questionnaire    Fear of Current or Ex-Partner: No    Emotionally Abused: No    Physically Abused: No    Sexually Abused: No    Physical Exam      Future Appointments  Date Time Provider Department Center  11/02/2022  1:30 PM Miki Kins, FNP [facility] None  11/15/2022  2:15 PM Raechel Chute, MD [facility] None  01/15/2023  2:00 PM [facility] OP 1 [facility]  01/15/2023  3:00 PM [facility] PA/NP [facility] None

## 2022-11-03 LAB — LIPID PANEL
Chol/HDL Ratio: 3.1 ratio (ref 0.0–5.0)
Cholesterol, Total: 152 mg/dL (ref 100–199)
HDL: 49 mg/dL (ref >39)
LDL Chol Calc (NIH): 92 mg/dL (ref 0–99)
Triglycerides: 50 mg/dL (ref 0–149)
VLDL Cholesterol Cal: 11 mg/dL (ref 5–40)

## 2022-11-03 LAB — CMP14+EGFR
ALT: 17 IU/L (ref 0–44)
AST: 18 IU/L (ref 0–40)
Albumin: 4.2 g/dL (ref 3.8–4.8)
Alkaline Phosphatase: 153 IU/L — ABNORMAL HIGH (ref 44–121)
BUN/Creatinine Ratio: 14 (ref 10–24)
BUN: 16 mg/dL (ref 8–27)
Bilirubin Total: 0.7 mg/dL (ref 0.0–1.2)
CO2: 23 mmol/L (ref 20–29)
Calcium: 10 mg/dL (ref 8.6–10.2)
Chloride: 104 mmol/L (ref 96–106)
Creatinine, Ser: 1.15 mg/dL (ref 0.76–1.27)
Globulin, Total: 2.7 g/dL (ref 1.5–4.5)
Glucose: 119 mg/dL — ABNORMAL HIGH (ref 70–99)
Potassium: 4 mmol/L (ref 3.5–5.2)
Sodium: 141 mmol/L (ref 134–144)
Total Protein: 6.9 g/dL (ref 6.0–8.5)
eGFR: 66 mL/min/{1.73_m2} (ref >59)

## 2022-11-03 LAB — HEMOGLOBIN A1C
Est. average glucose Bld gHb Est-mCnc: 117 mg/dL
Hgb A1c MFr Bld: 5.7 % — ABNORMAL HIGH (ref 4.8–5.6)

## 2022-11-03 LAB — VITAMIN D 25 HYDROXY (VIT D DEFICIENCY, FRACTURES): Vit D, 25-Hydroxy: 46.7 ng/mL (ref 30.0–100.0)

## 2022-11-03 LAB — TSH: TSH: 6.55 u[IU]/mL — ABNORMAL HIGH (ref 0.450–4.500)

## 2022-11-03 LAB — VITAMIN B12: Vitamin B-12: 361 pg/mL (ref 232–1245)

## 2022-11-03 NOTE — Telephone Encounter (Signed)
Called pt to discuss current housing concerns.  Unable to reach- left message with friend shirley who he lives with- awaiting return call  Burna Sis, LCSW Clinical Social Worker [facility] Desk#: [phone] Cell#: [phone]

## 2022-11-04 LAB — T4: T4, Total: 8.6 ug/dL (ref 4.5–12.0)

## 2022-11-04 LAB — T3: T3, Total: 87 ng/dL (ref 71–180)

## 2022-11-04 LAB — SPECIMEN STATUS REPORT

## 2022-11-05 DIAGNOSIS — J9601 Acute respiratory failure with hypoxia: Secondary | ICD-10-CM | POA: Diagnosis not present

## 2022-11-05 DIAGNOSIS — J449 Chronic obstructive pulmonary disease, unspecified: Secondary | ICD-10-CM | POA: Diagnosis not present

## 2022-11-05 DIAGNOSIS — I2510 Atherosclerotic heart disease of native coronary artery without angina pectoris: Secondary | ICD-10-CM | POA: Diagnosis not present

## 2022-11-05 DIAGNOSIS — I4891 Unspecified atrial fibrillation: Secondary | ICD-10-CM | POA: Diagnosis not present

## 2022-11-05 DIAGNOSIS — I2699 Other pulmonary embolism without acute cor pulmonale: Secondary | ICD-10-CM | POA: Diagnosis not present

## 2022-11-07 ENCOUNTER — Telehealth (HOSPITAL_COMMUNITY): Payer: Self-pay

## 2022-11-07 NOTE — Telephone Encounter (Signed)
Called in refill for Losartan to [facility].   [name], EMT-Paramedic [phone] 11/07/2022

## 2022-11-09 ENCOUNTER — Other Ambulatory Visit: Payer: Self-pay | Admitting: Cardiovascular Disease

## 2022-11-09 ENCOUNTER — Telehealth (HOSPITAL_COMMUNITY): Payer: Self-pay | Admitting: Emergency Medicine

## 2022-11-09 NOTE — Telephone Encounter (Signed)
Called to follow up with Bruce [name] to ensure he had contacted [place] about his phone, living situations, and financial concerns.  Bruce [name] did not answer his phone as it is not working. Called and  spoke with [name] to ask him to call me back. Will follow up in the home next week.   [name] EMT-P Community Paramedic  [phone]

## 2022-11-14 ENCOUNTER — Telehealth: Payer: Self-pay | Admitting: *Deleted

## 2022-11-14 ENCOUNTER — Telehealth (HOSPITAL_COMMUNITY): Payer: Self-pay | Admitting: Emergency Medicine

## 2022-11-14 NOTE — Telephone Encounter (Signed)
Reached out to shirley, she has been the contact for in the gap of him not having a phone. [name] did not answer the phone and the mailbox is full, so unable to leave a message. Will follow up in the home on Thursday.  [name] EMT-P Community Paramedic  [phone]

## 2022-11-14 NOTE — Telephone Encounter (Signed)
Called to remind pt of pulmonologist appointment. No answer. Will follow up with [name] (friend).  [name] EMT-P Community Paramedic  [phone] ]

## 2022-11-14 NOTE — Progress Notes (Unsigned)
  Care Coordination Note  11/14/2022 Name: Bruce [name] MRN: [id number] DOB: Mar 19, 1945  Bruce [name] is a 77 y.o. year old male who is a primary care patient of [name], NP (Inactive) and is actively engaged with the care management team. I reached out to Bruce [name] by phone today to assist with scheduling a follow up visit with the RN Case Manager  Follow up plan: Unsuccessful telephone outreach attempt made. A HIPAA compliant phone message was left for the patient providing contact information and requesting a return call.   Burman Nieves, CCMA Care Coordination Care Guide Direct Dial: [phone]

## 2022-11-15 ENCOUNTER — Telehealth (HOSPITAL_COMMUNITY): Payer: Self-pay | Admitting: Emergency Medicine

## 2022-11-15 ENCOUNTER — Ambulatory Visit (INDEPENDENT_AMBULATORY_CARE_PROVIDER_SITE_OTHER): Payer: 59 | Admitting: Student in an Organized Health Care Education/Training Program

## 2022-11-15 ENCOUNTER — Encounter: Payer: Self-pay | Admitting: Student in an Organized Health Care Education/Training Program

## 2022-11-15 VITALS — BP 126/76 | HR 90 | Temp 97.6°F | Ht 73.0 in | Wt 157.0 lb

## 2022-11-15 DIAGNOSIS — R918 Other nonspecific abnormal finding of lung field: Secondary | ICD-10-CM

## 2022-11-15 DIAGNOSIS — J439 Emphysema, unspecified: Secondary | ICD-10-CM

## 2022-11-15 DIAGNOSIS — I272 Pulmonary hypertension, unspecified: Secondary | ICD-10-CM

## 2022-11-15 NOTE — Progress Notes (Unsigned)
Synopsis: Referred in *** by [name], NP  Assessment & Plan:   1. Pulmonary emphysema, unspecified emphysema type (HCC)  Continues to be short of breath with exertion.  - Pulmonary Function Test [facility] Only; Future   Return in about 3 months (around 02/14/2023).  I spent *** minutes caring for this patient today, including {EM billing:[id number]}  Raechel Chute, MD [facility] 11/15/2022 2:30 PM    End of visit medications:  No orders of the defined types were placed in this encounter.    Current Outpatient Medications:    albuterol (VENTOLIN HFA) 108 (90 Base) MCG/ACT inhaler, TAKE TWO PUFFS BY MOUTH AS NEEDED FOR WHEEZING EVERY 4-6 HOURS, Disp: 8.5 g, Rfl: 5   ALPRAZolam (XANAX) 0.25 MG tablet, TAKE 1 TABLET BY MOUTH ONCE A DAY AS NEEDED FOR ANXIETY, Disp: 30 tablet, Rfl: 0   amiodarone (PACERONE) 200 MG tablet, Take 0.5 tablets (100 mg total) by mouth daily., Disp: 15 tablet, Rfl: 11   apixaban (ELIQUIS) 5 MG TABS tablet, TAKE ONE TABLET (5 MG TOTAL) BY MOUTH TWO (TWO) TIMES DAILY., Disp: 60 tablet, Rfl: 11   cetirizine (ZYRTEC) 10 MG tablet, Take 1 tablet (10 mg total) by mouth daily., Disp: 30 tablet, Rfl: 11   cholecalciferol (VITAMIN D3) 25 MCG (1000 UNIT) tablet, Take 1,000 Units by mouth daily., Disp: , Rfl:    digoxin (LANOXIN) 0.125 MG tablet, Take 1 tablet (0.125 mg total) by mouth daily., Disp: 30 tablet, Rfl: 6   FARXIGA 10 MG TABS tablet, TAKE ONE TABLET BY MOUTH EVERY MORNING, Disp: 90 tablet, Rfl: 0   fluticasone (FLONASE) 50 MCG/ACT nasal spray, BENDING HEAD FORWARD SPRAY ONE SPRAY INTO EACH NOSTRIL TWICE DAILY, Disp: 16 mL, Rfl: 5   lidocaine (LIDODERM) 5 %, Place 1 patch onto the skin daily. Remove & Discard patch within 12 hours or as directed by MD, Disp: 30 patch, Rfl: 11   losartan (COZAAR) 25 MG tablet, TAKE 0.5 TABLETS (12.5 MG TOTAL) BY MOUTH AT BEDTIME., Disp: 45 tablet, Rfl: 3   Multiple Vitamin (MULTIVITAMIN) tablet, Take 1  tablet by mouth daily. For Men, Disp: , Rfl:    nitroGLYCERIN (NITROSTAT) 0.4 MG SL tablet, Place 0.4 mg under the tongue every 5 (five) minutes as needed for chest pain., Disp: , Rfl:    Omega-3 Fatty Acids (FISH OIL PO), Take 1,000 mg by mouth every evening., Disp: , Rfl:    omeprazole (PRILOSEC) 40 MG capsule, Take 40 mg by mouth daily., Disp: , Rfl:    potassium chloride SA (KLOR-CON M) 20 MEQ tablet, Take 4 tablets (80 mEq total) by mouth daily for 1 day, THEN 2 tablets (40 mEq total) daily., Disp: 64 tablet, Rfl: 3   ranolazine (RANEXA) 500 MG 12 hr tablet, Take 1 tablet (500 mg total) by mouth 2 (two) times daily., Disp: 60 tablet, Rfl: 6   rosuvastatin (CRESTOR) 10 MG tablet, Take 1 tablet (10 mg total) by mouth daily., Disp: 30 tablet, Rfl: 6   sildenafil (REVATIO) 20 MG tablet, Take 1 tablet (20 mg total) by mouth 3 (three) times daily., Disp: 90 tablet, Rfl: 6   Tiotropium Bromide-Olodaterol (STIOLTO RESPIMAT) 2.5-2.5 MCG/ACT AERS, Inhale 2 puffs into the lungs daily., Disp: 60 each, Rfl: 12   Subjective:   PATIENT ID: Bruce [name] GENDER: male DOB: 1945-12-20, MRN: [id number]  Chief Complaint  Patient presents with   Follow-up    Patient reports cough, shortness of breath and wheezing.  HPI ***  Ancillary information including prior medications, full medical/surgical/family/social histories, and PFTs (when available) are listed below and have been reviewed.   ROS   Objective:   Vitals:   11/15/22 1357  BP: 126/76  Pulse: 90  Temp: 97.6 F (36.4 C)  TempSrc: Temporal  SpO2: 94%  Weight: 157 lb (71.2 kg)  Height: 6\' 1"  (1.854 m)   94% on *** LPM *** RA BMI Readings from Last 3 Encounters:  11/15/22 20.71 kg/m  11/02/22 20.82 kg/m  11/02/22 21.37 kg/m   Wt Readings from Last 3 Encounters:  11/15/22 157 lb (71.2 kg)  11/02/22 157 lb 12.8 oz (71.6 kg)  11/02/22 162 lb (73.5 kg)    Physical Exam    Ancillary Information    Past Medical History:   Diagnosis Date   Acute respiratory failure with hypoxia (HCC) 04/09/2022   AKI (acute kidney injury) (HCC)    Anginal pain (HCC)    Left side if chest ,NTG  relieves chaes apin 12/01/13   Anxiety    Arthritis    Auditory hallucination 03/18/2015   Blunt trauma of lower leg 10/08/2013   Cancer (HCC)    colon cancer   CHF (congestive heart failure) (HCC)    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 01/01/2015   Complication of anesthesia    wake up with a head ache   COPD (chronic obstructive pulmonary disease) (HCC)    Coronary artery disease    Edema 08/16/2010   GERD (gastroesophageal reflux disease)    Headache    related to sinus congestion   History of blood transfusion    History of kidney stones    History of MI (myocardial infarction) 10/08/2013   Hypertension    Hypokalemia 04/09/2022   Intracranial bleed (HCC) 12/31/2018   Lactic acidosis 04/09/2022   Leg cramps 08/16/2010   Malignant neoplasm of prostate (HCC) 07/13/2016   Multiple closed facial bone fractures (HCC)    Myocardial infarction [facility])    '09 AND '12   Peripheral vascular disease (HCC)    Shortness of breath    With exertion .   Tibia/fibula fracture 10/06/2013   Tibial plateau fracture 12/29/2014   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HCC) 10/08/2013   UTI (urinary tract infection)    frequent UTI   Visual hallucination 03/18/2015     Family History  Problem Relation Age of Onset   Hypertension Mother    Prostate cancer Brother    Mental illness Neg Hx      Past Surgical History:  Procedure Laterality Date   CARDIAC CATHETERIZATION     CHOLECYSTECTOMY     EXTERNAL FIXATION LEG Right 10/06/2013   Procedure: CLOSED REDUCTION RIGHT TIBIAL PLATEAU FRACTURE, EXTERNAL FIXATION RIGHT LEG, PLACEMENT OF WOUND VAC;  Surgeon: Budd Palmer, MD;  Location: [facility] OR;  Service: Orthopedics;  Laterality: Right;   FEMORAL-POPLITEAL BYPASS GRAFT Right 10/06/2013   Procedure: RIGHT  POPLITEAL-POPLITEAL ARTERY BYPASS GRAFT;  Surgeon: Larina Earthly, MD;  Location: [facility] OR;  Service: Vascular;  Laterality: Right;   FRACTURE SURGERY Right 2014   HARDWARE REMOVAL Right 12/02/2013   Procedure: REMOVAL EXTERNAL FIXATION RIGHT LEG ;  Surgeon: Budd Palmer, MD;  Location: [facility] OR;  Service: Orthopedics;  Laterality: Right;   HERNIA REPAIR Right 1990's   I & D EXTREMITY Right 10/09/2013   Procedure: IRRIGATION AND DEBRIDEMENT RIGHT LEG, CLOSURE  OF WOUNDS, PLACEMENT OF WOUND VAC ON EACH SIDE OF LEG;  Surgeon:  Budd Palmer, MD;  Location: [facility] OR;  Service: Orthopedics;  Laterality: Right;   IR ANGIOGRAM PULMONARY RIGHT SELECTIVE  04/10/2022   IR ANGIOGRAM SELECTIVE EACH ADDITIONAL VESSEL  04/10/2022   IR NEPHROSTOMY PLACEMENT LEFT  06/19/2016   IR THROMBECT PRIM MECH INIT (INCLU) MOD SED  04/10/2022   IR US GUIDE VASC ACCESS RIGHT  04/10/2022   IVC filter  2009   placed @ [facility]/ Removed in 2010.   IVC Filter Removed     LEFT HEART CATH AND CORONARY ANGIOGRAPHY Right 10/11/2018   Procedure: LEFT HEART CATH AND CORONARY ANGIOGRAPHY;  Surgeon: Laurier Nancy, MD;  Location: [facility] INVASIVE CV LAB;  Service: Cardiovascular;  Laterality: Right;   ligament leg Left    NEPHROLITHOTOMY Left 06/19/2016   Procedure: NEPHROLITHOTOMY PERCUTANEOUS;  Surgeon: Vanna Scotland, MD;  Location: [facility] ORS;  Service: Urology;  Laterality: Left;   ORIF TIBIA FRACTURE Right 12/29/2014   Procedure: OPEN REDUCTION INTERNAL FIXATION (ORIF) RIGHT TIBIA FRACTURE, RIA VS ICBG;  Surgeon: Myrene Galas, MD;  Location: [facility] OR;  Service: Orthopedics;  Laterality: Right;   RADIOACTIVE SEED IMPLANT N/A 09/12/2016   Procedure: RADIOACTIVE SEED IMPLANT/BRACHYTHERAPY IMPLANT;  Surgeon: Vanna Scotland, MD;  Location: [facility] ORS;  Service: Urology;  Laterality: N/A;   RIGHT/LEFT HEART CATH AND CORONARY ANGIOGRAPHY N/A 04/13/2022   Procedure: RIGHT/LEFT HEART CATH AND CORONARY ANGIOGRAPHY;  Surgeon: Dolores Patty, MD;  [facility]  INVASIVE CV LAB;  Service: Cardiovascular;  Laterality: N/A;    Social History   Socioeconomic History   Marital status: Single    Spouse name: Not on file   Number of children: 2   Years of education: Not on file   Highest education level: 6th grade  Occupational History   Not on file  Tobacco Use   Smoking status: Former    Current packs/day: 0.00    Average packs/day: 0.5 packs/day for 52.0 years (26.0 ttl pk-yrs)    Types: Cigarettes    Start date: 07/05/1970    Quit date: 07/05/2022    Years since quitting: 0.3   Smokeless tobacco: Never  Vaping Use   Vaping status: Never Used  Substance and Sexual Activity   Alcohol use: No    Alcohol/week: 0.0 standard drinks of alcohol    Comment: Stopped 2009   Drug use: No   Sexual activity: Not Currently  Other Topics Concern   Not on file  Social History Narrative   ** Merged History Encounter **       Social Determinants of Health   Financial Resource Strain: High Risk (04/12/2022)   Overall Financial Resource Strain (CARDIA)    Difficulty of Paying Living Expenses: Hard  Food Insecurity: Food Insecurity Present (08/03/2022)   Hunger Vital Sign    Worried About Running Out of Food in the Last Year: Sometimes true    Ran Out of Food in the Last Year: Sometimes true  Transportation Needs: No Transportation Needs (05/23/2022)   PRAPARE - Administrator, Civil Service (Medical): No    Lack of Transportation (Non-Medical): No  Physical Activity: Inactive (02/05/2017)   Exercise Vital Sign    Days of Exercise per Week: 0 days    Minutes of Exercise per Session: 0 min  Stress: Stress Concern Present (02/05/2017)   [employer] of Occupational Health - Occupational Stress Questionnaire    Feeling of Stress : Rather much  Social Connections: Moderately Isolated (02/05/2017)   Social Connection and Isolation Panel [NHANES]  Frequency of Communication with Friends and Family: More than three times a week     Frequency of Social Gatherings with Friends and Family: Once a week    Attends Religious Services: Never    Database administrator or Organizations: No    Attends Banker Meetings: Never    Marital Status: Divorced  Catering manager Violence: Not At Risk (04/14/2022)   Humiliation, Afraid, Rape, and Kick questionnaire    Fear of Current or Ex-Partner: No    Emotionally Abused: No    Physically Abused: No    Sexually Abused: No     Allergies  Allergen Reactions   Adhesive [Tape] Other (See Comments)    After right leg fracture surgery, pt developed a large blister where tape was applied to his right leg. OK to use paper tape.   Imdur [Isosorbide Dinitrate] Other (See Comments)    hallucinations   Singulair [Montelukast Sodium] Other (See Comments)    Hallucinations      CBC    Component Value Date/Time   WBC 6.9 07/06/2022 1821   RBC 3.85 (L) 07/06/2022 1821   HGB 11.6 (L) 07/06/2022 1821   HCT 36.8 (L) 07/06/2022 1821   PLT 276 07/06/2022 1821   MCV 95.6 07/06/2022 1821   MCH 30.1 07/06/2022 1821   MCHC 31.5 07/06/2022 1821   RDW 15.8 (H) 07/06/2022 1821   LYMPHSABS 0.7 07/06/2022 1821   MONOABS 0.5 07/06/2022 1821   EOSABS 0.2 07/06/2022 1821   BASOSABS 0.0 07/06/2022 1821    Pulmonary Functions Testing Results:     No data to display          Outpatient Medications Prior to Visit  Medication Sig Dispense Refill   albuterol (VENTOLIN HFA) 108 (90 Base) MCG/ACT inhaler TAKE TWO PUFFS BY MOUTH AS NEEDED FOR WHEEZING EVERY 4-6 HOURS 8.5 g 5   ALPRAZolam (XANAX) 0.25 MG tablet TAKE 1 TABLET BY MOUTH ONCE A DAY AS NEEDED FOR ANXIETY 30 tablet 0   amiodarone (PACERONE) 200 MG tablet Take 0.5 tablets (100 mg total) by mouth daily. 15 tablet 11   apixaban (ELIQUIS) 5 MG TABS tablet TAKE ONE TABLET (5 MG TOTAL) BY MOUTH TWO (TWO) TIMES DAILY. 60 tablet 11   cetirizine (ZYRTEC) 10 MG tablet Take 1 tablet (10 mg total) by mouth daily. 30 tablet 11    cholecalciferol (VITAMIN D3) 25 MCG (1000 UNIT) tablet Take 1,000 Units by mouth daily.     digoxin (LANOXIN) 0.125 MG tablet Take 1 tablet (0.125 mg total) by mouth daily. 30 tablet 6   FARXIGA 10 MG TABS tablet TAKE ONE TABLET BY MOUTH EVERY MORNING 90 tablet 0   fluticasone (FLONASE) 50 MCG/ACT nasal spray BENDING HEAD FORWARD SPRAY ONE SPRAY INTO EACH NOSTRIL TWICE DAILY 16 mL 5   lidocaine (LIDODERM) 5 % Place 1 patch onto the skin daily. Remove & Discard patch within 12 hours or as directed by MD 30 patch 11   losartan (COZAAR) 25 MG tablet TAKE 0.5 TABLETS (12.5 MG TOTAL) BY MOUTH AT BEDTIME. 45 tablet 3   Multiple Vitamin (MULTIVITAMIN) tablet Take 1 tablet by mouth daily. For Men     nitroGLYCERIN (NITROSTAT) 0.4 MG SL tablet Place 0.4 mg under the tongue every 5 (five) minutes as needed for chest pain.     Omega-3 Fatty Acids (FISH OIL PO) Take 1,000 mg by mouth every evening.     omeprazole (PRILOSEC) 40 MG capsule Take 40 mg by mouth daily.  potassium chloride SA (KLOR-CON M) 20 MEQ tablet Take 4 tablets (80 mEq total) by mouth daily for 1 day, THEN 2 tablets (40 mEq total) daily. 64 tablet 3   ranolazine (RANEXA) 500 MG 12 hr tablet Take 1 tablet (500 mg total) by mouth 2 (two) times daily. 60 tablet 6   rosuvastatin (CRESTOR) 10 MG tablet Take 1 tablet (10 mg total) by mouth daily. 30 tablet 6   sildenafil (REVATIO) 20 MG tablet Take 1 tablet (20 mg total) by mouth 3 (three) times daily. 90 tablet 6   Tiotropium Bromide-Olodaterol (STIOLTO RESPIMAT) 2.5-2.5 MCG/ACT AERS Inhale 2 puffs into the lungs daily. 60 each 12   No facility-administered medications prior to visit.

## 2022-11-15 NOTE — Telephone Encounter (Signed)
Opened in error

## 2022-11-15 NOTE — Telephone Encounter (Signed)
Attempting to contact Bruce [name], unable to reach [place] on his provided number or [place] after multiple attempts. Will continue to follow up.   [name] EMT-P Community Paramedic  [phone]

## 2022-11-16 ENCOUNTER — Telehealth (HOSPITAL_COMMUNITY): Payer: Self-pay | Admitting: Licensed Clinical Social Worker

## 2022-11-16 ENCOUNTER — Other Ambulatory Visit (HOSPITAL_COMMUNITY): Payer: Self-pay | Admitting: Emergency Medicine

## 2022-11-16 NOTE — Progress Notes (Signed)
Paramedicine Encounter    Patient ID: Bruce [name], male    DOB: [birth year], 77 y.o.   MRN: [id number]   Complaints - continued and increased muscle cramps in legs.   Assessment - no edema noted, wearing compression socks. Lung sounds clear.   Compliance with meds - missed all noon doses (cenidinfil), 1 morning and 1 evening dose   Pill box filled - for 1x week    Refills needed - Potassium  Meds changes since last visit - none     Social changes - in process of waiting on housing availability.    VISIT SUMMARY**  Met with Bruce [name] in the home today. Pt is still sleeping on the couch of his friend's trailer. Pt advised that he is waiting on a hopeful option for alternative housing. During visit we were able to call and speak with J Uris about more housing options, the potential of obtaining  a phone, and some financial concerns. [name] was able to solve/educate pt through all of these concerns and empower him for further success. Pt advised that he was going to contact social security about his check concerns and speak with them about gas reimbursement. [name] advised that she would order a phone and I could deliver it next week. She also advised that she would send him alternative housing options in his price range. Pt was grateful for her help. Call complete. Pt denied any increased shortness of breath, palpitations, or dizziness. Pt did report an episode that morning that felt like he was punched in the upper left chest for 1 second then relief. Pt denied treating it with NTG and denied any pain at present. Pt was advised the importance for being evaluated for same. Pt advised that it doesn't stay long enough for him to get to the hospital. Pt was advised of the risks of not being evaluated and pt understood. Pt's pill box was filled for 1x week. Appointments reviewed and note left for easy reference. Will follow up in the home in 1x week.  BP 130/78   Pulse 74   Resp 16   Wt 162 lb  (73.5 kg)   SpO2 97%   BMI 21.37 kg/m  Weight yesterday-DNW Last visit weight-162 lbs    [name] EMT-P Community Paramedic  [phone]    ACTION: Home visit completed     Patient Care Team: [name], NP (Inactive) as PCP - General (Nurse Practitioner) Laurier Nancy, MD as PCP - Cardiology (Cardiology)  Patient Active Problem List   Diagnosis Date Noted   MDD (major depressive disorder), recurrent episode, moderate (HCC) 09/01/2022   Pulmonary hypertension, unspecified (HCC) 04/14/2022   Chronic systolic heart failure (HCC) 04/13/2022   Pulmonary embolism (HCC) 04/09/2022   PAF (paroxysmal atrial fibrillation) (HCC)    NSTEMI (non-ST elevated myocardial infarction) (HCC) 10/10/2018   Syncope 04/28/2018   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   Limb ischemia 11/18/2013   Acute blood loss anemia 10/08/2013   Anxiety disorder 10/08/2013   Dyslipidemia 10/08/2013   GERD (gastroesophageal reflux disease) 10/08/2013   Anticoagulated 10/08/2013   Hypertension    CAD (coronary artery disease) 08/16/2010   COPD (chronic obstructive pulmonary disease) (HCC) 08/16/2010   DVT of leg (deep venous thrombosis) (HCC) 08/16/2010    Current Outpatient Medications:    albuterol (VENTOLIN HFA) 108 (90 Base) MCG/ACT inhaler, TAKE TWO PUFFS BY MOUTH AS NEEDED FOR WHEEZING EVERY 4-6 HOURS, Disp: 8.5 g, Rfl: 5   ALPRAZolam (XANAX) 0.25 MG tablet, TAKE  1 TABLET BY MOUTH ONCE A DAY AS NEEDED FOR ANXIETY, Disp: 30 tablet, Rfl: 0   amiodarone (PACERONE) 200 MG tablet, Take 0.5 tablets (100 mg total) by mouth daily., Disp: 15 tablet, Rfl: 11   apixaban (ELIQUIS) 5 MG TABS tablet, TAKE ONE TABLET (5 MG TOTAL) BY MOUTH TWO (TWO) TIMES DAILY., Disp: 60 tablet, Rfl: 11   cetirizine (ZYRTEC) 10 MG tablet, Take 1 tablet (10 mg total) by mouth daily., Disp: 30 tablet, Rfl: 11   cholecalciferol (VITAMIN D3) 25 MCG (1000 UNIT) tablet, Take 1,000 Units by mouth daily., Disp: , Rfl:    digoxin  (LANOXIN) 0.125 MG tablet, Take 1 tablet (0.125 mg total) by mouth daily., Disp: 30 tablet, Rfl: 6   FARXIGA 10 MG TABS tablet, TAKE ONE TABLET BY MOUTH EVERY MORNING, Disp: 90 tablet, Rfl: 0   fluticasone (FLONASE) 50 MCG/ACT nasal spray, BENDING HEAD FORWARD SPRAY ONE SPRAY INTO EACH NOSTRIL TWICE DAILY, Disp: 16 mL, Rfl: 5   lidocaine (LIDODERM) 5 %, Place 1 patch onto the skin daily. Remove & Discard patch within 12 hours or as directed by MD, Disp: 30 patch, Rfl: 11   losartan (COZAAR) 25 MG tablet, TAKE 0.5 TABLETS (12.5 MG TOTAL) BY MOUTH AT BEDTIME., Disp: 45 tablet, Rfl: 3   Multiple Vitamin (MULTIVITAMIN) tablet, Take 1 tablet by mouth daily. For Men, Disp: , Rfl:    nitroGLYCERIN (NITROSTAT) 0.4 MG SL tablet, Place 0.4 mg under the tongue every 5 (five) minutes as needed for chest pain., Disp: , Rfl:    Omega-3 Fatty Acids (FISH OIL PO), Take 1,000 mg by mouth every evening., Disp: , Rfl:    omeprazole (PRILOSEC) 40 MG capsule, Take 40 mg by mouth daily., Disp: , Rfl:    potassium chloride SA (KLOR-CON M) 20 MEQ tablet, Take 4 tablets (80 mEq total) by mouth daily for 1 day, THEN 2 tablets (40 mEq total) daily., Disp: 64 tablet, Rfl: 3   ranolazine (RANEXA) 500 MG 12 hr tablet, Take 1 tablet (500 mg total) by mouth 2 (two) times daily., Disp: 60 tablet, Rfl: 6   rosuvastatin (CRESTOR) 10 MG tablet, Take 1 tablet (10 mg total) by mouth daily., Disp: 30 tablet, Rfl: 6   sildenafil (REVATIO) 20 MG tablet, Take 1 tablet (20 mg total) by mouth 3 (three) times daily., Disp: 90 tablet, Rfl: 6   Tiotropium Bromide-Olodaterol (STIOLTO RESPIMAT) 2.5-2.5 MCG/ACT AERS, Inhale 2 puffs into the lungs daily., Disp: 60 each, Rfl: 12 Allergies  Allergen Reactions   Adhesive [Tape] Other (See Comments)    After right leg fracture surgery, pt developed a large blister where tape was applied to his right leg. OK to use paper tape.   Imdur [Isosorbide Dinitrate] Other (See Comments)    hallucinations    Singulair [Montelukast Sodium] Other (See Comments)    Hallucinations      Social History   Socioeconomic History   Marital status: Single    Spouse name: Not on file   Number of children: 2   Years of education: Not on file   Highest education level: 6th grade  Occupational History   Not on file  Tobacco Use   Smoking status: Former    Current packs/day: 0.00    Average packs/day: 0.5 packs/day for 52.0 years (26.0 ttl pk-yrs)    Types: Cigarettes    Start date: 07/05/1970    Quit date: 07/05/2022    Years since quitting: 0.3   Smokeless tobacco: Never  Vaping  Use   Vaping status: Never Used  Substance and Sexual Activity   Alcohol use: No    Alcohol/week: 0.0 standard drinks of alcohol    Comment: Stopped 2009   Drug use: No   Sexual activity: Not Currently  Other Topics Concern   Not on file  Social History Narrative   ** Merged History Encounter **       Social Determinants of Health   Financial Resource Strain: High Risk (04/12/2022)   Overall Financial Resource Strain (CARDIA)    Difficulty of Paying Living Expenses: Hard  Food Insecurity: Food Insecurity Present (08/03/2022)   Hunger Vital Sign    Worried About Running Out of Food in the Last Year: Sometimes true    Ran Out of Food in the Last Year: Sometimes true  Transportation Needs: No Transportation Needs (05/23/2022)   PRAPARE - Administrator, Civil Service (Medical): No    Lack of Transportation (Non-Medical): No  Physical Activity: Inactive (02/05/2017)   Exercise Vital Sign    Days of Exercise per Week: 0 days    Minutes of Exercise per Session: 0 min  Stress: Stress Concern Present (02/05/2017)   [employer] of Occupational Health - Occupational Stress Questionnaire    Feeling of Stress : Rather much  Social Connections: Moderately Isolated (02/05/2017)   Social Connection and Isolation Panel [NHANES]    Frequency of Communication with Friends and Family: More than three times a  week    Frequency of Social Gatherings with Friends and Family: Once a week    Attends Religious Services: Never    Database administrator or Organizations: No    Attends Banker Meetings: Never    Marital Status: Divorced  Catering manager Violence: Not At Risk (04/14/2022)   Humiliation, Afraid, Rape, and Kick questionnaire    Fear of Current or Ex-Partner: No    Emotionally Abused: No    Physically Abused: No    Sexually Abused: No    Physical Exam      Future Appointments  Date Time Provider Department Center  11/27/2022  9:00 AM Otho Ket, RN [facility] None  01/15/2023  2:00 PM [facility] OP 1 [facility]  01/15/2023  3:00 PM [facility] PA/NP [facility] None  02/05/2023  9:00 AM Miki Kins, FNP [facility] None  02/14/2023  9:30 AM Raechel Chute, MD [facility] None

## 2022-11-16 NOTE — Progress Notes (Signed)
  Care Coordination Note  11/16/2022 Name: ARMEEN CAPULONG MRN: [id number] DOB: Oct 15, 1945  Bruce [name] is a 77 y.o. year old male who is a primary care patient of [name], NP (Inactive) and is actively engaged with the care management team. I reached out to Bruce [name] by phone today to assist with scheduling a follow up visit with the RN Case Manager  Follow up plan: Telephone appointment with care management team member scheduled for: 11/27/2022  Burman Nieves, [facility] Care Coordination Care Guide Direct Dial: [phone]

## 2022-11-16 NOTE — Telephone Encounter (Signed)
H&V Care Navigation CSW Progress Note  Clinical Social Worker contacted by patient with assistance from paramedic to discuss current concerns.  Pt wondering about getting a phone to assist with being able to contact providers when needed.  Offered to help him get a phone if he could maintain buying minutes each month.  Pt agreeable to this and thinks he can manage.  Will plan to send phone out with paramedic when she complete home visit next week.  Pt also wanting to move out of his friends house.  Has been staying on the couch for months now.  Thinks he has an option for a house that he could move into Oct 1st but its not official.  Pt would like list of other options in case this doesn't work out.  CSW mailed list of apartments/ houses in his price range- reports budget is $750/month.  No other needs at this time  SDOH Screenings   Food Insecurity: Food Insecurity Present (08/03/2022)  Housing: Medium Risk (11/16/2022)  Transportation Needs: No Transportation Needs (05/23/2022)  Utilities: Not At Risk (04/14/2022)  Financial Resource Strain: High Risk (04/12/2022)  Physical Activity: Inactive (02/05/2017)  Social Connections: Moderately Isolated (02/05/2017)  Stress: Stress Concern Present (02/05/2017)  Tobacco Use: Medium Risk (11/15/2022)   Burna Sis, LCSW Clinical Social Worker [facility] Desk#: [phone] Cell#: [phone]

## 2022-11-19 ENCOUNTER — Encounter: Payer: Self-pay | Admitting: Family

## 2022-11-19 NOTE — Assessment & Plan Note (Signed)
Patient stable.  Well controlled with current therapy.   Continue current meds.  

## 2022-11-19 NOTE — Assessment & Plan Note (Signed)
Blood pressure well controlled with current medications.  Continue current therapy.  Will reassess at follow up.

## 2022-11-19 NOTE — Assessment & Plan Note (Signed)
Checking labs today.  Continue current therapy for lipid control. Will modify as needed based on labwork results.  

## 2022-11-19 NOTE — Progress Notes (Signed)
Established Patient Office Visit  Subjective:  Patient ID: Bruce [name], male    DOB: [birth year]  Age: 77 y.o. MRN: [id number]  Chief Complaint  Patient presents with   Follow-up    3 mo    Patient is here today for his 3 months follow up.  He has been feeling fairly well since last appointment.   He does have additional concerns to discuss today.  Says that he is having significant fatigue and difficulty walking. Asks if we can get him a handicap parking pass.   Labs are due today. He needs refills.   I have reviewed his active problem list, medication list, allergies, notes from last encounter, lab results for his appointment today.      No other concerns at this time.   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 01/01/2015   Complication of anesthesia    wake up with a head ache   COPD (chronic obstructive pulmonary disease) (HCC)    Coronary artery disease    Edema 08/16/2010   GERD (gastroesophageal reflux disease)    Headache    related to sinus congestion   History of blood transfusion    History of kidney stones    History of MI (myocardial infarction) 10/08/2013   Hypertension    Hypokalemia 04/09/2022   Intracranial bleed (HCC) 12/31/2018   Lactic acidosis 04/09/2022   Leg cramps 08/16/2010   Malignant neoplasm of prostate (HCC) 07/13/2016   Multiple closed facial bone fractures (HCC)    Myocardial infarction [facility])    '09 AND '12   Peripheral vascular disease (HCC)    Shortness of breath    With exertion .   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   UTI (urinary tract infection)    frequent UTI   Visual hallucination 03/18/2015    Past Surgical History:  Procedure Laterality Date   CARDIAC CATHETERIZATION     CHOLECYSTECTOMY     EXTERNAL FIXATION LEG Right 10/06/2013   Procedure: CLOSED REDUCTION RIGHT TIBIAL PLATEAU FRACTURE, EXTERNAL FIXATION RIGHT LEG, PLACEMENT OF WOUND VAC;  Surgeon: Budd Palmer, MD;  Location: [facility] OR;  Service: Orthopedics;  Laterality: Right;   FEMORAL-POPLITEAL BYPASS GRAFT Right 10/06/2013   Procedure: RIGHT POPLITEAL-POPLITEAL ARTERY BYPASS GRAFT;  Surgeon: Larina Earthly, MD;  Location: [facility] OR;  Service: Vascular;  Laterality: Right;   FRACTURE SURGERY Right 2014   HARDWARE REMOVAL Right 12/02/2013   Procedure: REMOVAL EXTERNAL FIXATION RIGHT LEG ;  Surgeon: Budd Palmer, MD;  Location: [facility] OR;  Service: Orthopedics;  Laterality: Right;   HERNIA REPAIR Right 1990's   I & D EXTREMITY Right 10/09/2013   Procedure: IRRIGATION AND DEBRIDEMENT RIGHT LEG, CLOSURE  OF WOUNDS, PLACEMENT OF WOUND VAC ON EACH SIDE OF LEG;  Surgeon: Budd Palmer, MD;  Location: [facility] OR;  Service: Orthopedics;  Laterality: Right;   IR ANGIOGRAM PULMONARY RIGHT SELECTIVE  04/10/2022   IR  ANGIOGRAM SELECTIVE EACH ADDITIONAL VESSEL  04/10/2022   IR NEPHROSTOMY PLACEMENT LEFT  06/19/2016   IR THROMBECT PRIM MECH INIT (INCLU) MOD SED  04/10/2022   IR US GUIDE VASC ACCESS RIGHT  04/10/2022   IVC filter  2009   placed @ [facility]/ Removed in 2010.   IVC Filter Removed     LEFT HEART CATH AND CORONARY ANGIOGRAPHY Right 10/11/2018   Procedure: LEFT HEART CATH AND CORONARY ANGIOGRAPHY;  Surgeon: Laurier Nancy, MD;  Location: [facility] INVASIVE CV LAB;  Service: Cardiovascular;  Laterality: Right;   ligament leg Left    NEPHROLITHOTOMY Left 06/19/2016   Procedure: NEPHROLITHOTOMY PERCUTANEOUS;  Surgeon: Vanna Scotland, MD;  Location: [facility] ORS;  Service: Urology;   Laterality: Left;   ORIF TIBIA FRACTURE Right 12/29/2014   Procedure: OPEN REDUCTION INTERNAL FIXATION (ORIF) RIGHT TIBIA FRACTURE, RIA VS ICBG;  Surgeon: Myrene Galas, MD;  Location: [facility] OR;  Service: Orthopedics;  Laterality: Right;   RADIOACTIVE SEED IMPLANT N/A 09/12/2016   Procedure: RADIOACTIVE SEED IMPLANT/BRACHYTHERAPY IMPLANT;  Surgeon: Vanna Scotland, MD;  Location: [facility] ORS;  Service: Urology;  Laterality: N/A;   RIGHT/LEFT HEART CATH AND CORONARY ANGIOGRAPHY N/A 04/13/2022   Procedure: RIGHT/LEFT HEART CATH AND CORONARY ANGIOGRAPHY;  Surgeon: Dolores Patty, MD;  Location: [facility] LAB;  Service: Cardiovascular;  Laterality: N/A;    Social History   Socioeconomic History   Marital status: Single    Spouse name: Not on file   Number of children: 2   Years of education: Not on file   Highest education level: 6th grade  Occupational History   Not on file  Tobacco Use   Smoking status: Former    Current packs/day: 0.00    Average packs/day: 0.5 packs/day for 52.0 years (26.0 ttl pk-yrs)    Types: Cigarettes    Start date: 07/05/1970    Quit date: 07/05/2022    Years since quitting: 0.3   Smokeless tobacco: Never  Vaping Use   Vaping status: Never Used  Substance and Sexual Activity   Alcohol use: No    Alcohol/week: 0.0 standard drinks of alcohol    Comment: Stopped 2009   Drug use: No   Sexual activity: Not Currently  Other Topics Concern   Not on file  Social History Narrative   ** Merged History Encounter **       Social Determinants of Health   Financial Resource Strain: High Risk (04/12/2022)   Overall Financial Resource Strain (CARDIA)    Difficulty of Paying Living Expenses: Hard  Food Insecurity: Food Insecurity Present (08/03/2022)   Hunger Vital Sign    Worried About Running Out of Food in the Last Year: Sometimes true    Ran Out of Food in the Last Year: Sometimes true  Transportation Needs: No Transportation Needs (05/23/2022)   PRAPARE -  Administrator, Civil Service (Medical): No    Lack of Transportation (Non-Medical): No  Physical Activity: Inactive (02/05/2017)   Exercise Vital Sign    Days of Exercise per Week: 0 days    Minutes of Exercise per Session: 0 min  Stress: Stress Concern Present (02/05/2017)   [employer] of Occupational Health - Occupational Stress Questionnaire    Feeling of Stress : Rather much  Social Connections: Moderately Isolated (02/05/2017)   Social Connection and Isolation Panel [NHANES]    Frequency of Communication with Friends and Family: More than three times a week    Frequency of Social Gatherings with Friends and  Family: Once a week    Attends Religious Services: Never    Active Member of Clubs or Organizations: No    Attends Banker Meetings: Never    Marital Status: Divorced  Catering manager Violence: Not At Risk (04/14/2022)   Humiliation, Afraid, Rape, and Kick questionnaire    Fear of Current or Ex-Partner: No    Emotionally Abused: No    Physically Abused: No    Sexually Abused: No    Family History  Problem Relation Age of Onset   Hypertension Mother    Prostate cancer Brother    Mental illness Neg Hx     Allergies  Allergen Reactions   Adhesive [Tape] Other (See Comments)    After right leg fracture surgery, pt developed a large blister where tape was applied to his right leg. OK to use paper tape.   Imdur [Isosorbide Dinitrate] Other (See Comments)    hallucinations   Singulair [Montelukast Sodium] Other (See Comments)    Hallucinations     Review of Systems  Constitutional:  Positive for malaise/fatigue.  Respiratory:  Positive for cough, shortness of breath and wheezing.   Neurological:  Positive for weakness.  All other systems reviewed and are negative.      Objective:   BP (!) 140/70   Pulse 95   Ht 6\' 1"  (1.854 m)   Wt 157 lb 12.8 oz (71.6 kg)   SpO2 90%   BMI 20.82 kg/m   Vitals:   11/02/22 1320  BP: (!)  140/70  Pulse: 95  Height: 6\' 1"  (1.854 m)  Weight: 157 lb 12.8 oz (71.6 kg)  SpO2: 90%  BMI (Calculated): 20.82    Physical Exam Vitals and nursing note reviewed.  Constitutional:      Appearance: Normal appearance. He is normal weight.  Eyes:     Extraocular Movements: Extraocular movements intact.     Conjunctiva/sclera: Conjunctivae normal.     Pupils: Pupils are equal, round, and reactive to light.  Cardiovascular:     Rate and Rhythm: Normal rate and regular rhythm.     Pulses: Normal pulses.     Heart sounds: Normal heart sounds.  Pulmonary:     Effort: Pulmonary effort is normal.     Breath sounds: Normal breath sounds.  Neurological:     General: No focal deficit present.     Mental Status: He is alert and oriented to person, place, and time. Mental status is at baseline.     Motor: Weakness present.  Psychiatric:        Mood and Affect: Mood normal.        Behavior: Behavior normal.        Thought Content: Thought content normal.        Judgment: Judgment normal.      Results for orders placed or performed in visit on 11/02/22  Lipid panel  Result Value Ref Range   Cholesterol, Total 152 100 - 199 mg/dL   Triglycerides 50 0 - 149 mg/dL   HDL 49 >78 mg/dL   VLDL Cholesterol Cal 11 5 - 40 mg/dL   LDL Chol Calc (NIH) 92 0 - 99 mg/dL   Chol/HDL Ratio 3.1 0.0 - 5.0 ratio  VITAMIN D 25 Hydroxy (Vit-D Deficiency, Fractures)  Result Value Ref Range   Vit D, 25-Hydroxy 46.7 30.0 - 100.0 ng/mL  CMP14+EGFR  Result Value Ref Range   Glucose 119 (H) 70 - 99 mg/dL   BUN 16 8 - 27 mg/dL  Creatinine, Ser 1.15 0.76 - 1.27 mg/dL   eGFR 66 >40 JW/JXB/1.47   BUN/Creatinine Ratio 14 10 - 24   Sodium 141 134 - 144 mmol/L   Potassium 4.0 3.5 - 5.2 mmol/L   Chloride 104 96 - 106 mmol/L   CO2 23 20 - 29 mmol/L   Calcium 10.0 8.6 - 10.2 mg/dL   Total Protein 6.9 6.0 - 8.5 g/dL   Albumin 4.2 3.8 - 4.8 g/dL   Globulin, Total 2.7 1.5 - 4.5 g/dL   Bilirubin Total 0.7 0.0 -  1.2 mg/dL   Alkaline Phosphatase 153 (H) 44 - 121 IU/L   AST 18 0 - 40 IU/L   ALT 17 0 - 44 IU/L  TSH  Result Value Ref Range   TSH 6.550 (H) 0.450 - 4.500 uIU/mL  Hemoglobin A1c  Result Value Ref Range   Hgb A1c MFr Bld 5.7 (H) 4.8 - 5.6 %   Est. average glucose Bld gHb Est-mCnc 117 mg/dL  Vitamin W29  Result Value Ref Range   Vitamin B-12 361 232 - 1,245 pg/mL  T4  Result Value Ref Range   T4, Total 8.6 4.5 - 12.0 ug/dL  T3  Result Value Ref Range   T3, Total 87 71 - 180 ng/dL  Specimen status report  Result Value Ref Range   specimen status report Comment     Recent Results (from the past 2160 hour(s))  Basic metabolic panel     Status: Abnormal   Collection Time: 09/25/22 12:13 PM  Result Value Ref Range   Sodium 137 135 - 145 mmol/L   Potassium 3.1 (L) 3.5 - 5.1 mmol/L   Chloride 106 98 - 111 mmol/L   CO2 23 22 - 32 mmol/L   Glucose, Bld 108 (H) 70 - 99 mg/dL    Comment: Glucose reference range applies only to samples taken after fasting for at least 8 hours.   BUN 9 8 - 23 mg/dL   Creatinine, Ser 5.62 0.61 - 1.24 mg/dL   Calcium 9.3 8.9 - 13.0 mg/dL   GFR, Estimated >86 >57 mL/min    Comment: (NOTE) Calculated using the CKD-EPI Creatinine Equation (2021)    Anion gap 8 5 - 15    Comment: Performed at [facility], [address] [place], [place] [address]  Lipid panel     Status: None   Collection Time: 11/02/22  2:06 PM  Result Value Ref Range   Cholesterol, Total 152 100 - 199 mg/dL   Triglycerides 50 0 - 149 mg/dL   HDL 49 >29 mg/dL   VLDL Cholesterol Cal 11 5 - 40 mg/dL   LDL Chol Calc (NIH) 92 0 - 99 mg/dL   Chol/HDL Ratio 3.1 0.0 - 5.0 ratio    Comment:                                   T. Chol/HDL Ratio                                             Men  Women                               1/2 Avg.Risk  3.4    3.3  Avg.Risk  5.0    4.4                                2X Avg.Risk  9.6    7.1                                 3X Avg.Risk 23.4   11.0   VITAMIN D 25 Hydroxy (Vit-D Deficiency, Fractures)     Status: None   Collection Time: 11/02/22  2:06 PM  Result Value Ref Range   Vit D, 25-Hydroxy 46.7 30.0 - 100.0 ng/mL    Comment: Vitamin D deficiency has been defined by the Institute of Medicine and an Endocrine Society practice guideline as a level of serum 25-OH vitamin D less than 20 ng/mL (1,2). The Endocrine Society went on to further define vitamin D insufficiency as a level between 21 and 29 ng/mL (2). 1. IOM (Institute of Medicine). 2010. Dietary reference    intakes for calcium and D. [place]: The    [employer]. 2. Holick MF, Binkley [place], Bischoff-Ferrari HA, et al.    Evaluation, treatment, and prevention of vitamin D    deficiency: an Endocrine Society clinical practice    guideline. JCEM. 2011 Jul; 96(7):1911-30.   CMP14+EGFR     Status: Abnormal   Collection Time: 11/02/22  2:06 PM  Result Value Ref Range   Glucose 119 (H) 70 - 99 mg/dL   BUN 16 8 - 27 mg/dL   Creatinine, Ser 5.95 0.76 - 1.27 mg/dL   eGFR 66 >63 OV/FIE/3.32   BUN/Creatinine Ratio 14 10 - 24   Sodium 141 134 - 144 mmol/L   Potassium 4.0 3.5 - 5.2 mmol/L   Chloride 104 96 - 106 mmol/L   CO2 23 20 - 29 mmol/L   Calcium 10.0 8.6 - 10.2 mg/dL   Total Protein 6.9 6.0 - 8.5 g/dL   Albumin 4.2 3.8 - 4.8 g/dL   Globulin, Total 2.7 1.5 - 4.5 g/dL   Bilirubin Total 0.7 0.0 - 1.2 mg/dL   Alkaline Phosphatase 153 (H) 44 - 121 IU/L   AST 18 0 - 40 IU/L   ALT 17 0 - 44 IU/L  TSH     Status: Abnormal   Collection Time: 11/02/22  2:06 PM  Result Value Ref Range   TSH 6.550 (H) 0.450 - 4.500 uIU/mL  Hemoglobin A1c     Status: Abnormal   Collection Time: 11/02/22  2:06 PM  Result Value Ref Range   Hgb A1c MFr Bld 5.7 (H) 4.8 - 5.6 %    Comment:          Prediabetes: 5.7 - 6.4          Diabetes: >6.4          Glycemic control for adults with diabetes: <7.0    Est. average glucose Bld gHb Est-mCnc  117 mg/dL  Vitamin R51     Status: None   Collection Time: 11/02/22  2:06 PM  Result Value Ref Range   Vitamin B-12 361 232 - 1,245 pg/mL  T4     Status: None   Collection Time: 11/02/22  2:06 PM  Result Value Ref Range   T4, Total 8.6 4.5 - 12.0 ug/dL  T3     Status: None   Collection Time: 11/02/22  2:06 PM  Result Value  Ref Range   T3, Total 87 71 - 180 ng/dL  Specimen status report     Status: None   Collection Time: 11/02/22  2:06 PM  Result Value Ref Range   specimen status report Comment     Comment: Written Authorization Written Authorization Written Authorization Received. Authorization received from [employer]  for [employer] on 11-03-2022 Logged by Adria Dill        Assessment & Plan:   Problem List Items Addressed This Visit       Active Problems   Hypertension (Chronic)    Blood pressure well controlled with current medications.  Continue current therapy.  Will reassess at follow up.       Relevant Orders   CMP14+EGFR (Completed)   TSH (Completed)   CBC with Differential/Platelet   Dyslipidemia (Chronic)    Checking labs today.  Continue current therapy for lipid control. Will modify as needed based on labwork results.        Relevant Orders   Lipid panel (Completed)   CMP14+EGFR (Completed)   TSH (Completed)   CBC with Differential/Platelet   GERD (gastroesophageal reflux disease) (Chronic)   Relevant Orders   CMP14+EGFR (Completed)   TSH (Completed)   CBC with Differential/Platelet   MDD (major depressive disorder), recurrent episode, moderate (HCC)    Patient stable.  Well controlled with current therapy.   Continue current meds.        Relevant Orders   CMP14+EGFR (Completed)   TSH (Completed)   CBC with Differential/Platelet   Other Visit Diagnoses     Acute right ankle pain    -  Primary   Sending pt for x-ray of his right ankle.  Will call him wiht results when available.   Relevant Orders   CMP14+EGFR  (Completed)   TSH (Completed)   CBC with Differential/Platelet   DG Ankle Complete Right (Completed)   Prediabetes       Checking labs today Patient counseled on dietary choices and verbalized understanding.   Relevant Orders   CMP14+EGFR (Completed)   TSH (Completed)   Hemoglobin A1c (Completed)   CBC with Differential/Platelet   B12 deficiency due to diet       Checking labs today.  Will continue supplements as needed.   Relevant Orders   CMP14+EGFR (Completed)   TSH (Completed)   Vitamin B12 (Completed)   CBC with Differential/Platelet   Vitamin D deficiency, unspecified       Checking labs today.  Will continue supplements as needed.   Relevant Orders   VITAMIN D 25 Hydroxy (Vit-D Deficiency, Fractures) (Completed)   CMP14+EGFR (Completed)   TSH (Completed)   CBC with Differential/Platelet   Abnormal thyroid function test       Relevant Orders   T4 (Completed)   T3 (Completed)   Specimen status report (Completed)       Return in about 3 months (around 02/02/2023) for F/U.   Total time spent: 30 minutes  Miki Kins, FNP  11/02/2022   This document may have been prepared by [facility] Voice Recognition software and as such may include unintentional dictation errors.

## 2022-11-20 ENCOUNTER — Other Ambulatory Visit (HOSPITAL_COMMUNITY): Payer: Self-pay | Admitting: Internal Medicine

## 2022-11-21 ENCOUNTER — Telehealth (HOSPITAL_COMMUNITY): Payer: Self-pay | Admitting: Licensed Clinical Social Worker

## 2022-11-21 NOTE — Telephone Encounter (Signed)
H&V Care Navigation CSW Progress Note  Clinical Social Worker assisted pt in obtaining a phone- new number [phone]- information updated in the chart   SDOH Screenings   Food Insecurity: Food Insecurity Present (08/03/2022)  Housing: Medium Risk (11/16/2022)  Transportation Needs: No Transportation Needs (05/23/2022)  Utilities: Not At Risk (04/14/2022)  Financial Resource Strain: High Risk (04/12/2022)  Physical Activity: Inactive (02/05/2017)  Social Connections: Moderately Isolated (02/05/2017)  Stress: Stress Concern Present (02/05/2017)  Tobacco Use: Medium Risk (11/19/2022)    Burna Sis, LCSW Clinical Social Worker [facility] Desk#: [phone] Cell#: [phone]

## 2022-11-22 ENCOUNTER — Other Ambulatory Visit: Payer: Self-pay | Admitting: Family

## 2022-11-22 ENCOUNTER — Other Ambulatory Visit (HOSPITAL_COMMUNITY): Payer: Self-pay | Admitting: Emergency Medicine

## 2022-11-22 NOTE — Progress Notes (Signed)
Paramedicine Encounter    Patient ID: Bruce [name], male    DOB: 01-Sep-1945, 77 y.o.   MRN: [id number]   Complaints - reports continued light headedness when standing.  Assessment - Lung sounds clear, no edema noted.   Compliance with meds - missed 4  morning doses, 3 evening doses, all 7 noon doses.   Pill box filled - for 1x week   Refills needed - alprazolam, ropinirole, potassium   Meds changes since last visit - none    Social changes- Housing concerns continue. [employer] Phone delivered.      VISIT SUMMARY**  Met with Bruce [name] in the home today. Bruce [name] advised that he did not get the home he was hoping for. Pt reported that he received housing options from [place] but was not pleased with the location of the options. Pt denied any concerns at this time and no acute assessment changes noted. Pt was spoken to about the compliance with his medication. Pt was educated on the importance on saem and was given new tricks to attempt to be successful. Pt's pill box was filled for 1x week. No upcoming appointments to review. Will follow up in 1x week.   BP (!) 150/90   Pulse 72   Resp 17   Wt 162 lb (73.5 kg)   SpO2 98%   BMI 21.37 kg/m  Weight yesterday-DNW Last visit weight-162 lbs  [name] EMT-P Community Paramedic  [phone]     ACTION: Home visit completed     Patient Care Team: [name], NP (Inactive) as PCP - General (Nurse Practitioner) Laurier Nancy, MD as PCP - Cardiology (Cardiology)  Patient Active Problem List   Diagnosis Date Noted   MDD (major depressive disorder), recurrent episode, moderate (HCC) 09/01/2022   Pulmonary hypertension, unspecified (HCC) 04/14/2022   Chronic systolic heart failure (HCC) 04/13/2022   Pulmonary embolism (HCC) 04/09/2022   PAF (paroxysmal atrial fibrillation) (HCC)    NSTEMI (non-ST elevated myocardial infarction) (HCC) 10/10/2018   Syncope 04/28/2018   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   Limb ischemia  11/18/2013   Acute blood loss anemia 10/08/2013   Anxiety disorder 10/08/2013   Dyslipidemia 10/08/2013   GERD (gastroesophageal reflux disease) 10/08/2013   Anticoagulated 10/08/2013   Hypertension    CAD (coronary artery disease) 08/16/2010   COPD (chronic obstructive pulmonary disease) (HCC) 08/16/2010   DVT of leg (deep venous thrombosis) (HCC) 08/16/2010    Current Outpatient Medications:    albuterol (VENTOLIN HFA) 108 (90 Base) MCG/ACT inhaler, TAKE TWO PUFFS BY MOUTH AS NEEDED FOR WHEEZING EVERY 4-6 HOURS, Disp: 8.5 g, Rfl: 5   ALPRAZolam (XANAX) 0.25 MG tablet, TAKE 1 TABLET BY MOUTH ONCE A DAY AS NEEDED FOR ANXIETY, Disp: 30 tablet, Rfl: 0   amiodarone (PACERONE) 200 MG tablet, Take 0.5 tablets (100 mg total) by mouth daily., Disp: 15 tablet, Rfl: 11   apixaban (ELIQUIS) 5 MG TABS tablet, TAKE ONE TABLET (5 MG TOTAL) BY MOUTH TWO (TWO) TIMES DAILY., Disp: 60 tablet, Rfl: 11   cetirizine (ZYRTEC) 10 MG tablet, Take 1 tablet (10 mg total) by mouth daily., Disp: 30 tablet, Rfl: 11   cholecalciferol (VITAMIN D3) 25 MCG (1000 UNIT) tablet, Take 1,000 Units by mouth daily., Disp: , Rfl:    digoxin (LANOXIN) 0.125 MG tablet, Take 1 tablet (0.125 mg total) by mouth daily., Disp: 30 tablet, Rfl: 6   FARXIGA 10 MG TABS tablet, TAKE ONE TABLET BY MOUTH EVERY MORNING, Disp: 90 tablet, Rfl: 0  fluticasone (FLONASE) 50 MCG/ACT nasal spray, BENDING HEAD FORWARD SPRAY ONE SPRAY INTO EACH NOSTRIL TWICE DAILY, Disp: 16 mL, Rfl: 5   lidocaine (LIDODERM) 5 %, Place 1 patch onto the skin daily. Remove & Discard patch within 12 hours or as directed by MD, Disp: 30 patch, Rfl: 11   losartan (COZAAR) 25 MG tablet, TAKE 0.5 TABLETS (12.5 MG TOTAL) BY MOUTH AT BEDTIME., Disp: 50 tablet, Rfl: 3   Multiple Vitamin (MULTIVITAMIN) tablet, Take 1 tablet by mouth daily. For Men, Disp: , Rfl:    nitroGLYCERIN (NITROSTAT) 0.4 MG SL tablet, Place 0.4 mg under the tongue every 5 (five) minutes as needed for chest  pain., Disp: , Rfl:    Omega-3 Fatty Acids (FISH OIL PO), Take 1,000 mg by mouth every evening., Disp: , Rfl:    omeprazole (PRILOSEC) 40 MG capsule, Take 40 mg by mouth daily., Disp: , Rfl:    potassium chloride SA (KLOR-CON M) 20 MEQ tablet, Take 4 tablets (80 mEq total) by mouth daily for 1 day, THEN 2 tablets (40 mEq total) daily., Disp: 64 tablet, Rfl: 3   ranolazine (RANEXA) 500 MG 12 hr tablet, Take 1 tablet (500 mg total) by mouth 2 (two) times daily., Disp: 60 tablet, Rfl: 6   rosuvastatin (CRESTOR) 10 MG tablet, TAKE ONE TABLET (10 MG TOTAL) BY MOUTH DAILY., Disp: 100 tablet, Rfl: 3   sildenafil (REVATIO) 20 MG tablet, Take 1 tablet (20 mg total) by mouth 3 (three) times daily., Disp: 90 tablet, Rfl: 6   Tiotropium Bromide-Olodaterol (STIOLTO RESPIMAT) 2.5-2.5 MCG/ACT AERS, Inhale 2 puffs into the lungs daily., Disp: 60 each, Rfl: 12 Allergies  Allergen Reactions   Adhesive [Tape] Other (See Comments)    After right leg fracture surgery, pt developed a large blister where tape was applied to his right leg. OK to use paper tape.   Imdur [Isosorbide Dinitrate] Other (See Comments)    hallucinations   Singulair [Montelukast Sodium] Other (See Comments)    Hallucinations      Social History   Socioeconomic History   Marital status: Single    Spouse name: Not on file   Number of children: 2   Years of education: Not on file   Highest education level: 6th grade  Occupational History   Not on file  Tobacco Use   Smoking status: Former    Current packs/day: 0.00    Average packs/day: 0.5 packs/day for 52.0 years (26.0 ttl pk-yrs)    Types: Cigarettes    Start date: 07/05/1970    Quit date: 07/05/2022    Years since quitting: 0.3   Smokeless tobacco: Never  Vaping Use   Vaping status: Never Used  Substance and Sexual Activity   Alcohol use: No    Alcohol/week: 0.0 standard drinks of alcohol    Comment: Stopped 2009   Drug use: No   Sexual activity: Not Currently  Other  Topics Concern   Not on file  Social History Narrative   ** Merged History Encounter **       Social Determinants of Health   Financial Resource Strain: High Risk (04/12/2022)   Overall Financial Resource Strain (CARDIA)    Difficulty of Paying Living Expenses: Hard  Food Insecurity: Food Insecurity Present (08/03/2022)   Hunger Vital Sign    Worried About Running Out of Food in the Last Year: Sometimes true    Ran Out of Food in the Last Year: Sometimes true  Transportation Needs: No Transportation Needs (05/23/2022)  PRAPARE - Administrator, Civil Service (Medical): No    Lack of Transportation (Non-Medical): No  Physical Activity: Inactive (02/05/2017)   Exercise Vital Sign    Days of Exercise per Week: 0 days    Minutes of Exercise per Session: 0 min  Stress: Stress Concern Present (02/05/2017)   [employer] of Occupational Health - Occupational Stress Questionnaire    Feeling of Stress : Rather much  Social Connections: Moderately Isolated (02/05/2017)   Social Connection and Isolation Panel [NHANES]    Frequency of Communication with Friends and Family: More than three times a week    Frequency of Social Gatherings with Friends and Family: Once a week    Attends Religious Services: Never    Database administrator or Organizations: No    Attends Banker Meetings: Never    Marital Status: Divorced  Catering manager Violence: Not At Risk (04/14/2022)   Humiliation, Afraid, Rape, and Kick questionnaire    Fear of Current or Ex-Partner: No    Emotionally Abused: No    Physically Abused: No    Sexually Abused: No    Physical Exam      Future Appointments  Date Time Provider Department Center  11/27/2022  9:00 AM Otho Ket, RN [facility] None  01/15/2023  2:00 PM MC ECHO OP 1 [facility]  01/15/2023  3:00 PM [facility] PA/NP [facility] None  02/05/2023  9:00 AM Miki Kins, FNP AMA-AMA None  02/14/2023  9:30 AM Raechel Chute, MD  [facility] None

## 2022-11-27 ENCOUNTER — Ambulatory Visit: Payer: Self-pay

## 2022-11-27 NOTE — Patient Outreach (Signed)
Care Coordination   Follow Up Visit Note   11/27/2022 Name: Bruce [name] MRN: [id number] DOB: November 09, 1945  Bruce [name] is a 77 y.o. year old male who sees [name], NP (Inactive) for primary care. I spoke with  Bruce [name] by phone today.  What matters to the patients health and wellness today?  Patient denies increase in heart failure or COPD symptoms.  Patient states he continues to have shortness of breath with any activity and states this has been ongoing for him.  Patient states he continues to use his oxygen at L around the clock and uses inhaler as needed.  Patient reports having chest xray and right ankle xray last month.  He states he is concerned about this especially involving his ankle because he continues to have pain whether standing, sitting or laying or if he is touching his ankle. Patient states he is not sure how he hurt his ankle.     Goals Addressed             This Visit's Progress    Post hospital follow up / management of health conditions       Interventions Today    Flowsheet Row Most Recent Value  Chronic Disease   Chronic disease during today's visit Chronic Obstructive Pulmonary Disease (COPD), Congestive Heart Failure (CHF), Other  [right ankle pain]  General Interventions   General Interventions Discussed/Reviewed General Interventions Reviewed, Doctor Visits  [evaluation of current treatment plan and patients adherence to plan as establishe by provider. Assessed for COPD/HF symptoms and pain level of right ankle.]  Doctor Visits Discussed/Reviewed Doctor Visits Reviewed  Annabell Sabal upcoming provider visits. Advised to keep follow up visits with provider.]  Education Interventions   Education Provided Provided Education  [reviewed COPD/ HF symptoms.  Advised to notifiy provider of mild/ moderate symptoms and call 911 for severe symptoms.  Advised to continue wearing compression stockings as recommended by provider]  Provided Verbal Education On  Other  [Advised patient to contact primary care provider office for results of chest and ankle xray. Assessed for ongoing oxygen use.]  Pharmacy Interventions   Pharmacy Dicussed/Reviewed Pharmacy Topics Reviewed  [medications reviewed.  Discussed importance of medication compliance.]  Safety Interventions   Safety Discussed/Reviewed Fall Risk  [Assessed for falls. Advised to continue to use recommended ambulatory device for balance/ safety]              SDOH assessments and interventions completed:  No     Care Coordination Interventions:  Yes, provided   Follow up plan: Follow up call scheduled for 12/21/22    Encounter Outcome:  Patient Visit Completed   George Ina RN,BSN,CCM [facility] Care Coordination [phone] direct line

## 2022-11-27 NOTE — Patient Instructions (Signed)
Visit Information  Thank you for taking time to visit with me today. Please don't hesitate to contact me if I can be of assistance to you.   Following are the goals we discussed today:   Goals Addressed             This Visit's Progress    Post hospital follow up / management of health conditions       Interventions Today    Flowsheet Row Most Recent Value  Chronic Disease   Chronic disease during today's visit Chronic Obstructive Pulmonary Disease (COPD), Congestive Heart Failure (CHF), Other  [right ankle pain]  General Interventions   General Interventions Discussed/Reviewed General Interventions Reviewed, Doctor Visits  [evaluation of current treatment plan and patients adherence to plan as establishe by provider. Assessed for COPD/HF symptoms and pain level of right ankle.]  Doctor Visits Discussed/Reviewed Doctor Visits Reviewed  Annabell Sabal upcoming provider visits. Advised to keep follow up visits with provider.]  Education Interventions   Education Provided Provided Education  [reviewed COPD/ HF symptoms.  Advised to notifiy provider of mild/ moderate symptoms and call 911 for severe symptoms.  Advised to continue wearing compression stockings as recommended by provider]  Provided Verbal Education On Other  [Advised patient to contact primary care provider office for results of chest and ankle xray. Assessed for ongoing oxygen use.]  Pharmacy Interventions   Pharmacy Dicussed/Reviewed Pharmacy Topics Reviewed  [medications reviewed.  Discussed importance of medication compliance.]  Safety Interventions   Safety Discussed/Reviewed Fall Risk  [Assessed for falls. Advised to continue to use recommended ambulatory device for balance/ safety]              Our next appointment is by telephone on 12/21/22 at 10 am  Please call the care guide team at [phone] if you need to cancel or reschedule your appointment.   If you are experiencing a Mental Health or Behavioral  Health Crisis or need someone to talk to, please call the Suicide and Crisis Lifeline: 988 call 1-800-273-TALK (toll free, 24 hour hotline)  The patient verbalized understanding of instructions, educational materials, and care plan provided today and agreed to receive a mailed copy of patient instructions, educational materials, and care plan.   George Ina RN,BSN,CCM [facility] Care Coordination [phone] direct line

## 2022-11-29 ENCOUNTER — Telehealth (HOSPITAL_COMMUNITY): Payer: Self-pay | Admitting: Emergency Medicine

## 2022-11-29 ENCOUNTER — Other Ambulatory Visit (HOSPITAL_COMMUNITY): Payer: Self-pay | Admitting: Emergency Medicine

## 2022-11-29 NOTE — Telephone Encounter (Signed)
Requested Auto refill to be stopped and they advised that they would. Bruce [name] was complaining on spending too much on his medications and constantly picking up medicines. Due to pt's current compliance he has plenty of meds and needs no further refills.   I will call for refill if he needs any going forward.   [name] EMT-P Community Paramedic  [phone]

## 2022-11-29 NOTE — Progress Notes (Signed)
Paramedicine Encounter    Patient ID: Bruce [name], male    DOB: [birth year], 77 y.o.   MRN: [id number]   Complaints - none   Assessment - clear lung sounds, no edema   Compliance with meds - missed 4 morning, 4 evening and all noon doses - pt instructed to take pill boxes with him, but refuses to do so. Inquired about pill packs and will follow up after his meeting at [facility]  Pill box filled - for 2x weeks  Refills needed - NONE  Meds changes since last visit - none    Social changes - none   VISIT SUMMARY**   Met with Mr. Daman in the home today. Pt has missed a significant amount of medications in the past week. Pt refused to take responsibility for same and advised that he had been taking them out of the pill box nightly, but only 3 morning and 3 evening doses were emptied from the box I filled 1x week ago. Pt was educated on the importance of compliance and again recommended that he needed to take the medication straight from the pill box, not add an additional step of transferring it to daily bottles. Pt agreed, will follow up in 2x weeks on same. Pt complained of increased costs at the Pharmacy. T was advised that his medications were on auto-refill and requested permission to cancel same. Pharmacy was called and auto-refills cancelled. Pt inquired on possibility of having pills packaged by the pharmacy, but was resistant too the size of the books. Pt was informed of the options for pill packs in a reel and was advised that we can look into those after his appointment in November. Pt was in agreement. Pill box was filled for 2 weeks. Upcoming appointments reviewed. Will follow up in 2x weeks.   BP 128/80   Pulse 72   Resp 16   Wt 162 lb (73.5 kg)   SpO2 96%   BMI 21.37 kg/m  Weight yesterday-DNW Last visit weight-162 lbs  [name] EMT-P Community Paramedic  [phone]     ACTION: Home visit completed     Patient Care Team: [name], NP  (Inactive) as PCP - General (Nurse Practitioner) Laurier Nancy, MD as PCP - Cardiology (Cardiology) Otho Ket, RN as [facility] Care Management  Patient Active Problem List   Diagnosis Date Noted   MDD (major depressive disorder), recurrent episode, moderate (HCC) 09/01/2022   Pulmonary hypertension, unspecified (HCC) 04/14/2022   Chronic systolic heart failure (HCC) 04/13/2022   Pulmonary embolism (HCC) 04/09/2022   PAF (paroxysmal atrial fibrillation) (HCC)    NSTEMI (non-ST elevated myocardial infarction) (HCC) 10/10/2018   Syncope 04/28/2018   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   Limb ischemia 11/18/2013   Acute blood loss anemia 10/08/2013   Anxiety disorder 10/08/2013   Dyslipidemia 10/08/2013   GERD (gastroesophageal reflux disease) 10/08/2013   Anticoagulated 10/08/2013   Hypertension    CAD (coronary artery disease) 08/16/2010   COPD (chronic obstructive pulmonary disease) (HCC) 08/16/2010   DVT of leg (deep venous thrombosis) (HCC) 08/16/2010    Current Outpatient Medications:    albuterol (VENTOLIN HFA) 108 (90 Base) MCG/ACT inhaler, TAKE TWO PUFFS BY MOUTH AS NEEDED FOR WHEEZING EVERY 4-6 HOURS, Disp: 8.5 g, Rfl: 5   ALPRAZolam (XANAX) 0.25 MG tablet, TAKE ONE TABLET BY MOUTH ONCE A DAY AS NEEDED FOR ANXIETY, Disp: 30 tablet, Rfl: 2   amiodarone (PACERONE) 200 MG tablet, Take 0.5 tablets (100 mg total)  by mouth daily., Disp: 15 tablet, Rfl: 11   apixaban (ELIQUIS) 5 MG TABS tablet, TAKE ONE TABLET (5 MG TOTAL) BY MOUTH TWO (TWO) TIMES DAILY., Disp: 60 tablet, Rfl: 11   cetirizine (ZYRTEC) 10 MG tablet, Take 1 tablet (10 mg total) by mouth daily., Disp: 30 tablet, Rfl: 11   cholecalciferol (VITAMIN D3) 25 MCG (1000 UNIT) tablet, Take 1,000 Units by mouth daily., Disp: , Rfl:    digoxin (LANOXIN) 0.125 MG tablet, Take 1 tablet (0.125 mg total) by mouth daily., Disp: 30 tablet, Rfl: 6   FARXIGA 10 MG TABS tablet, TAKE ONE TABLET BY MOUTH EVERY MORNING, Disp:  90 tablet, Rfl: 0   fluticasone (FLONASE) 50 MCG/ACT nasal spray, BENDING HEAD FORWARD SPRAY ONE SPRAY INTO EACH NOSTRIL TWICE DAILY, Disp: 16 mL, Rfl: 5   lidocaine (LIDODERM) 5 %, Place 1 patch onto the skin daily. Remove & Discard patch within 12 hours or as directed by MD, Disp: 30 patch, Rfl: 11   losartan (COZAAR) 25 MG tablet, TAKE 0.5 TABLETS (12.5 MG TOTAL) BY MOUTH AT BEDTIME., Disp: 50 tablet, Rfl: 3   Multiple Vitamin (MULTIVITAMIN) tablet, Take 1 tablet by mouth daily. For Men, Disp: , Rfl:    nitroGLYCERIN (NITROSTAT) 0.4 MG SL tablet, Place 0.4 mg under the tongue every 5 (five) minutes as needed for chest pain., Disp: , Rfl:    Omega-3 Fatty Acids (FISH OIL PO), Take 1,000 mg by mouth every evening., Disp: , Rfl:    omeprazole (PRILOSEC) 40 MG capsule, Take 40 mg by mouth daily., Disp: , Rfl:    potassium chloride SA (KLOR-CON M) 20 MEQ tablet, Take 4 tablets (80 mEq total) by mouth daily for 1 day, THEN 2 tablets (40 mEq total) daily., Disp: 64 tablet, Rfl: 3   ranolazine (RANEXA) 500 MG 12 hr tablet, Take 1 tablet (500 mg total) by mouth 2 (two) times daily., Disp: 60 tablet, Rfl: 6   rosuvastatin (CRESTOR) 10 MG tablet, TAKE ONE TABLET (10 MG TOTAL) BY MOUTH DAILY., Disp: 100 tablet, Rfl: 3   sildenafil (REVATIO) 20 MG tablet, Take 1 tablet (20 mg total) by mouth 3 (three) times daily., Disp: 90 tablet, Rfl: 6   Tiotropium Bromide-Olodaterol (STIOLTO RESPIMAT) 2.5-2.5 MCG/ACT AERS, Inhale 2 puffs into the lungs daily., Disp: 60 each, Rfl: 12 Allergies  Allergen Reactions   Adhesive [Tape] Other (See Comments)    After right leg fracture surgery, pt developed a large blister where tape was applied to his right leg. OK to use paper tape.   Imdur [Isosorbide Dinitrate] Other (See Comments)    hallucinations   Singulair [Montelukast Sodium] Other (See Comments)    Hallucinations      Social History   Socioeconomic History   Marital status: Single    Spouse name: Not on  file   Number of children: 2   Years of education: Not on file   Highest education level: 6th grade  Occupational History   Not on file  Tobacco Use   Smoking status: Former    Current packs/day: 0.00    Average packs/day: 0.5 packs/day for 52.0 years (26.0 ttl pk-yrs)    Types: Cigarettes    Start date: 07/05/1970    Quit date: 07/05/2022    Years since quitting: 0.4   Smokeless tobacco: Never  Vaping Use   Vaping status: Never Used  Substance and Sexual Activity   Alcohol use: No    Alcohol/week: 0.0 standard drinks of alcohol  Comment: Stopped 2009   Drug use: No   Sexual activity: Not Currently  Other Topics Concern   Not on file  Social History Narrative   ** Merged History Encounter **       Social Determinants of Health   Financial Resource Strain: High Risk (04/12/2022)   Overall Financial Resource Strain (CARDIA)    Difficulty of Paying Living Expenses: Hard  Food Insecurity: Food Insecurity Present (08/03/2022)   Hunger Vital Sign    Worried About Running Out of Food in the Last Year: Sometimes true    Ran Out of Food in the Last Year: Sometimes true  Transportation Needs: No Transportation Needs (05/23/2022)   PRAPARE - Administrator, Civil Service (Medical): No    Lack of Transportation (Non-Medical): No  Physical Activity: Inactive (02/05/2017)   Exercise Vital Sign    Days of Exercise per Week: 0 days    Minutes of Exercise per Session: 0 min  Stress: Stress Concern Present (02/05/2017)   [employer] of Occupational Health - Occupational Stress Questionnaire    Feeling of Stress : Rather much  Social Connections: Moderately Isolated (02/05/2017)   Social Connection and Isolation Panel [NHANES]    Frequency of Communication with Friends and Family: More than three times a week    Frequency of Social Gatherings with Friends and Family: Once a week    Attends Religious Services: Never    Database administrator or Organizations: No    Attends  Banker Meetings: Never    Marital Status: Divorced  Catering manager Violence: Not At Risk (04/14/2022)   Humiliation, Afraid, Rape, and Kick questionnaire    Fear of Current or Ex-Partner: No    Emotionally Abused: No    Physically Abused: No    Sexually Abused: No    Physical Exam      Future Appointments  Date Time Provider Department Center  12/21/2022 10:00 AM Otho Ket, RN [facility] None  01/15/2023  2:00 PM MC ECHO OP 1 [facility]  01/15/2023  3:00 PM [facility] PA/NP [facility] None  02/05/2023  9:00 AM Miki Kins, FNP AMA-AMA None  02/19/2023 10:15 AM Raechel Chute, MD [facility] None

## 2022-12-05 ENCOUNTER — Encounter: Payer: Self-pay | Admitting: Family

## 2022-12-05 ENCOUNTER — Ambulatory Visit (INDEPENDENT_AMBULATORY_CARE_PROVIDER_SITE_OTHER): Payer: 59 | Admitting: Family

## 2022-12-05 VITALS — BP 140/80 | HR 81 | Ht 73.0 in | Wt 157.8 lb

## 2022-12-05 DIAGNOSIS — I10 Essential (primary) hypertension: Secondary | ICD-10-CM | POA: Diagnosis not present

## 2022-12-05 DIAGNOSIS — E538 Deficiency of other specified B group vitamins: Secondary | ICD-10-CM

## 2022-12-05 DIAGNOSIS — F331 Major depressive disorder, recurrent, moderate: Secondary | ICD-10-CM

## 2022-12-05 DIAGNOSIS — I739 Peripheral vascular disease, unspecified: Secondary | ICD-10-CM | POA: Diagnosis not present

## 2022-12-05 DIAGNOSIS — R7303 Prediabetes: Secondary | ICD-10-CM

## 2022-12-05 DIAGNOSIS — F330 Major depressive disorder, recurrent, mild: Secondary | ICD-10-CM

## 2022-12-05 MED ORDER — BACLOFEN 10 MG PO TABS: 10.0000 mg | ORAL_TABLET | Freq: Every evening | ORAL | 0 refills | Status: DC | PRN

## 2022-12-05 MED ORDER — CYANOCOBALAMIN 1000 MCG/ML IJ SOLN
1000.0000 ug | Freq: Once | INTRAMUSCULAR | Status: AC
Start: 2022-12-05 — End: 2022-12-05
  Administered 2022-12-05: 1000 ug via INTRAMUSCULAR

## 2022-12-12 ENCOUNTER — Ambulatory Visit: Payer: 59

## 2022-12-12 DIAGNOSIS — I739 Peripheral vascular disease, unspecified: Secondary | ICD-10-CM | POA: Diagnosis not present

## 2022-12-13 ENCOUNTER — Other Ambulatory Visit (HOSPITAL_COMMUNITY): Payer: Self-pay | Admitting: Emergency Medicine

## 2022-12-13 NOTE — Progress Notes (Signed)
Paramedicine Encounter    Patient ID: Bruce [name], male    DOB: 15-Jul-1945, 77 y.o.   MRN: [id number]   Complaints - no complaints.   Assessment - lung sounds clear, no edema noted  Compliance with meds - missed 3x morning doses and all noon doses.   Pill box filled - for 2x weeks   Refills needed - Potassium   Meds changes since last visit - none   Social changes - none   VISIT SUMMARY**  Met with Mr. Loewy in the home today. Mr. Mensah had improved on his medication compliance, only missing 3x doses. Pt denied any complaints other than complaining that his phone that we supplied him does not work. Pt reported that he had done no research or efforts to attempt to troubleshoot. I picked up the phone and called [employer] technical support. During call, phone concern was fixed and pt educated on how to prevent this in the future. Pt was appreciative. Pt reported that his chronic ankle pain was found to be a bone spur, requiring surgery. Pt unaware if/when that will take place. Pt's pill box was filled for 2x weeks. Upcoming appointments reviewed. Will follow up in 2x weeks.   BP (!) 142/90   Pulse 82   Resp (!) 26   Wt 158 lb (71.7 kg)   SpO2 96%   BMI 20.85 kg/m  Weight yesterday-DNW Last visit weight-162 lbs   [name] EMT-P Community Paramedic  [phone]    ACTION: Home visit completed     Patient Care Team: Miki Kins, FNP as PCP - General (Family Medicine) Laurier Nancy, MD as PCP - Cardiology (Cardiology) Otho Ket, RN as [facility] Care Management  Patient Active Problem List   Diagnosis Date Noted   MDD (major depressive disorder), recurrent episode, moderate (HCC) 09/01/2022   Pulmonary hypertension, unspecified (HCC) 04/14/2022   Chronic systolic heart failure (HCC) 04/13/2022   Pulmonary embolism (HCC) 04/09/2022   PAF (paroxysmal atrial fibrillation) (HCC)    NSTEMI (non-ST elevated myocardial infarction)  (HCC) 10/10/2018   Syncope 04/28/2018   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   Limb ischemia 11/18/2013   Acute blood loss anemia 10/08/2013   Anxiety disorder 10/08/2013   Dyslipidemia 10/08/2013   GERD (gastroesophageal reflux disease) 10/08/2013   Anticoagulated 10/08/2013   Hypertension    CAD (coronary artery disease) 08/16/2010   COPD (chronic obstructive pulmonary disease) (HCC) 08/16/2010   DVT of leg (deep venous thrombosis) (HCC) 08/16/2010    Current Outpatient Medications:    albuterol (VENTOLIN HFA) 108 (90 Base) MCG/ACT inhaler, TAKE TWO PUFFS BY MOUTH AS NEEDED FOR WHEEZING EVERY 4-6 HOURS, Disp: 8.5 g, Rfl: 5   ALPRAZolam (XANAX) 0.25 MG tablet, TAKE ONE TABLET BY MOUTH ONCE A DAY AS NEEDED FOR ANXIETY, Disp: 30 tablet, Rfl: 2   amiodarone (PACERONE) 200 MG tablet, Take 0.5 tablets (100 mg total) by mouth daily., Disp: 15 tablet, Rfl: 11   apixaban (ELIQUIS) 5 MG TABS tablet, TAKE ONE TABLET (5 MG TOTAL) BY MOUTH TWO (TWO) TIMES DAILY., Disp: 60 tablet, Rfl: 11   baclofen (LIORESAL) 10 MG tablet, Take 1 tablet (10 mg total) by mouth at bedtime as needed for muscle spasms., Disp: 30 tablet, Rfl: 0   cetirizine (ZYRTEC) 10 MG tablet, Take 1 tablet (10 mg total) by mouth daily., Disp: 30 tablet, Rfl: 11   cholecalciferol (VITAMIN D3) 25 MCG (1000 UNIT) tablet, Take 1,000 Units by mouth daily., Disp: , Rfl:  digoxin (LANOXIN) 0.125 MG tablet, Take 1 tablet (0.125 mg total) by mouth daily., Disp: 30 tablet, Rfl: 6   FARXIGA 10 MG TABS tablet, TAKE ONE TABLET BY MOUTH EVERY MORNING, Disp: 90 tablet, Rfl: 0   fluticasone (FLONASE) 50 MCG/ACT nasal spray, BENDING HEAD FORWARD SPRAY ONE SPRAY INTO EACH NOSTRIL TWICE DAILY, Disp: 16 mL, Rfl: 5   lidocaine (LIDODERM) 5 %, Place 1 patch onto the skin daily. Remove & Discard patch within 12 hours or as directed by MD, Disp: 30 patch, Rfl: 11   losartan (COZAAR) 25 MG tablet, TAKE 0.5 TABLETS (12.5 MG TOTAL) BY MOUTH AT BEDTIME., Disp: 50  tablet, Rfl: 3   Multiple Vitamin (MULTIVITAMIN) tablet, Take 1 tablet by mouth daily. For Men, Disp: , Rfl:    nitroGLYCERIN (NITROSTAT) 0.4 MG SL tablet, Place 0.4 mg under the tongue every 5 (five) minutes as needed for chest pain., Disp: , Rfl:    Omega-3 Fatty Acids (FISH OIL PO), Take 1,000 mg by mouth every evening., Disp: , Rfl:    omeprazole (PRILOSEC) 40 MG capsule, Take 40 mg by mouth daily., Disp: , Rfl:    potassium chloride SA (KLOR-CON M) 20 MEQ tablet, Take 4 tablets (80 mEq total) by mouth daily for 1 day, THEN 2 tablets (40 mEq total) daily., Disp: 64 tablet, Rfl: 3   ranolazine (RANEXA) 500 MG 12 hr tablet, Take 1 tablet (500 mg total) by mouth 2 (two) times daily., Disp: 60 tablet, Rfl: 6   rosuvastatin (CRESTOR) 10 MG tablet, TAKE ONE TABLET (10 MG TOTAL) BY MOUTH DAILY., Disp: 100 tablet, Rfl: 3   sildenafil (REVATIO) 20 MG tablet, Take 1 tablet (20 mg total) by mouth 3 (three) times daily., Disp: 90 tablet, Rfl: 6   Tiotropium Bromide-Olodaterol (STIOLTO RESPIMAT) 2.5-2.5 MCG/ACT AERS, Inhale 2 puffs into the lungs daily., Disp: 60 each, Rfl: 12 Allergies  Allergen Reactions   Adhesive [Tape] Other (See Comments)    After right leg fracture surgery, pt developed a large blister where tape was applied to his right leg. OK to use paper tape.   Imdur [Isosorbide Dinitrate] Other (See Comments)    hallucinations   Singulair [Montelukast Sodium] Other (See Comments)    Hallucinations      Social History   Socioeconomic History   Marital status: Single    Spouse name: Not on file   Number of children: 2   Years of education: Not on file   Highest education level: 6th grade  Occupational History   Not on file  Tobacco Use   Smoking status: Former    Current packs/day: 0.00    Average packs/day: 0.5 packs/day for 52.0 years (26.0 ttl pk-yrs)    Types: Cigarettes    Start date: 07/05/1970    Quit date: 07/05/2022    Years since quitting: 0.4   Smokeless tobacco:  Never  Vaping Use   Vaping status: Never Used  Substance and Sexual Activity   Alcohol use: No    Alcohol/week: 0.0 standard drinks of alcohol    Comment: Stopped 2009   Drug use: No   Sexual activity: Not Currently  Other Topics Concern   Not on file  Social History Narrative   ** Merged History Encounter **       Social Determinants of Health   Financial Resource Strain: High Risk (04/12/2022)   Overall Financial Resource Strain (CARDIA)    Difficulty of Paying Living Expenses: Hard  Food Insecurity: Food Insecurity Present (08/03/2022)  Hunger Vital Sign    Worried About Running Out of Food in the Last Year: Sometimes true    Ran Out of Food in the Last Year: Sometimes true  Transportation Needs: No Transportation Needs (05/23/2022)   PRAPARE - Administrator, Civil Service (Medical): No    Lack of Transportation (Non-Medical): No  Physical Activity: Inactive (02/05/2017)   Exercise Vital Sign    Days of Exercise per Week: 0 days    Minutes of Exercise per Session: 0 min  Stress: Stress Concern Present (02/05/2017)   [employer] of Occupational Health - Occupational Stress Questionnaire    Feeling of Stress : Rather much  Social Connections: Moderately Isolated (02/05/2017)   Social Connection and Isolation Panel [NHANES]    Frequency of Communication with Friends and Family: More than three times a week    Frequency of Social Gatherings with Friends and Family: Once a week    Attends Religious Services: Never    Database administrator or Organizations: No    Attends Banker Meetings: Never    Marital Status: Divorced  Catering manager Violence: Not At Risk (04/14/2022)   Humiliation, Afraid, Rape, and Kick questionnaire    Fear of Current or Ex-Partner: No    Emotionally Abused: No    Physically Abused: No    Sexually Abused: No    Physical Exam      Future Appointments  Date Time Provider Department Center  12/21/2022 10:00 AM  Otho Ket, RN [facility] None  01/15/2023  2:00 PM [facility] ECHO OP 1 [facility]  01/15/2023  3:00 PM [facility] PA/NP [facility] None  02/05/2023  9:00 AM Miki Kins, FNP [facility] None  02/19/2023 10:15 AM Raechel Chute, MD [facility] None

## 2022-12-15 ENCOUNTER — Other Ambulatory Visit (HOSPITAL_COMMUNITY): Payer: Self-pay | Admitting: Cardiology

## 2022-12-18 ENCOUNTER — Encounter: Payer: Self-pay | Admitting: Family

## 2022-12-18 DIAGNOSIS — I739 Peripheral vascular disease, unspecified: Secondary | ICD-10-CM | POA: Insufficient documentation

## 2022-12-18 DIAGNOSIS — E538 Deficiency of other specified B group vitamins: Secondary | ICD-10-CM | POA: Insufficient documentation

## 2022-12-18 NOTE — Assessment & Plan Note (Signed)
Patient stable.  Well controlled with current therapy.   Continue current meds.  

## 2022-12-18 NOTE — Progress Notes (Signed)
Acute Office Visit  Subjective:     Patient ID: Bruce [name], male    DOB: 1945-12-08, 77 y.o.   MRN: [id number]  Patient is in today for  Chief Complaint  Patient presents with   Pain    Been going on for 4/5 months- noticed it started happening after they removed blood clots    Leg Pain  There was no injury mechanism. The pain is present in the left leg and right leg. The quality of the pain is described as aching and cramping. The pain is at a severity of 10/10. The pain is severe. The pain has been Intermittent since onset. He reports no foreign bodies present. The symptoms are aggravated by movement and weight bearing. He has tried acetaminophen, elevation, ice, heat, NSAIDs, non-weight bearing and rest for the symptoms. The treatment provided mild relief.     Review of Systems  Respiratory:  Positive for shortness of breath.   Musculoskeletal:  Positive for myalgias.  All other systems reviewed and are negative.       Objective:    BP (!) 140/80   Pulse 81   Ht 6\' 1"  (1.854 m)   Wt 157 lb 12.8 oz (71.6 kg)   SpO2 97%   BMI 20.82 kg/m   Physical Exam Vitals and nursing note reviewed.  Constitutional:      Appearance: Normal appearance. He is normal weight.  Eyes:     Pupils: Pupils are equal, round, and reactive to light.  Cardiovascular:     Rate and Rhythm: Normal rate and regular rhythm.     Pulses: Normal pulses.     Heart sounds: Normal heart sounds.  Pulmonary:     Effort: Pulmonary effort is normal.     Breath sounds: Wheezing present.  Neurological:     General: No focal deficit present.     Mental Status: He is alert and oriented to person, place, and time. Mental status is at baseline.  Psychiatric:        Mood and Affect: Mood normal.        Behavior: Behavior normal.     No results found for any visits on 12/05/22.  Recent Results (from the past 2160 hour(s))  Basic metabolic panel     Status: Abnormal   Collection Time: 09/25/22  12:13 PM  Result Value Ref Range   Sodium 137 135 - 145 mmol/L   Potassium 3.1 (L) 3.5 - 5.1 mmol/L   Chloride 106 98 - 111 mmol/L   CO2 23 22 - 32 mmol/L   Glucose, Bld 108 (H) 70 - 99 mg/dL    Comment: Glucose reference range applies only to samples taken after fasting for at least 8 hours.   BUN 9 8 - 23 mg/dL   Creatinine, Ser 4.09 0.61 - 1.24 mg/dL   Calcium 9.3 8.9 - 81.1 mg/dL   GFR, Estimated >91 >47 mL/min    Comment: (NOTE) Calculated using the CKD-EPI Creatinine Equation (2021)    Anion gap 8 5 - 15    Comment: Performed at [facility], [address], [place], [place] [address]  Lipid panel     Status: None   Collection Time: 11/02/22  2:06 PM  Result Value Ref Range   Cholesterol, Total 152 100 - 199 mg/dL   Triglycerides 50 0 - 149 mg/dL   HDL 49 >21 mg/dL   VLDL Cholesterol Cal 11 5 - 40 mg/dL   LDL Chol Calc (NIH) 92  0 - 99 mg/dL   Chol/HDL Ratio 3.1 0.0 - 5.0 ratio    Comment:                                   T. Chol/HDL Ratio                                             Men  Women                               1/2 Avg.Risk  3.4    3.3                                   Avg.Risk  5.0    4.4                                2X Avg.Risk  9.6    7.1                                3X Avg.Risk 23.4   11.0   VITAMIN D 25 Hydroxy (Vit-D Deficiency, Fractures)     Status: None   Collection Time: 11/02/22  2:06 PM  Result Value Ref Range   Vit D, 25-Hydroxy 46.7 30.0 - 100.0 ng/mL    Comment: Vitamin D deficiency has been defined by the Institute of Medicine and an Endocrine Society practice guideline as a level of serum 25-OH vitamin D less than 20 ng/mL (1,2). The Endocrine Society went on to further define vitamin D insufficiency as a level between 21 and 29 ng/mL (2). 1. IOM (Institute of Medicine). 2010. Dietary reference    intakes for calcium and D. [place]: The    [employer]. 2. Holick MF, Binkley McBee, Bischoff-Ferrari HA, et al.     Evaluation, treatment, and prevention of vitamin D    deficiency: an Endocrine Society clinical practice    guideline. JCEM. 2011 Jul; 96(7):1911-30.   CMP14+EGFR     Status: Abnormal   Collection Time: 11/02/22  2:06 PM  Result Value Ref Range   Glucose 119 (H) 70 - 99 mg/dL   BUN 16 8 - 27 mg/dL   Creatinine, Ser 1.32 0.76 - 1.27 mg/dL   eGFR 66 >44 WN/UUV/2.53   BUN/Creatinine Ratio 14 10 - 24   Sodium 141 134 - 144 mmol/L   Potassium 4.0 3.5 - 5.2 mmol/L   Chloride 104 96 - 106 mmol/L   CO2 23 20 - 29 mmol/L   Calcium 10.0 8.6 - 10.2 mg/dL   Total Protein 6.9 6.0 - 8.5 g/dL   Albumin 4.2 3.8 - 4.8 g/dL   Globulin, Total 2.7 1.5 - 4.5 g/dL   Bilirubin Total 0.7 0.0 - 1.2 mg/dL   Alkaline Phosphatase 153 (H) 44 - 121 IU/L   AST 18 0 - 40 IU/L   ALT 17 0 - 44 IU/L  TSH     Status: Abnormal   Collection Time: 11/02/22  2:06 PM  Result Value Ref Range   TSH 6.550 (H) 0.450 -  4.500 uIU/mL  Hemoglobin A1c     Status: Abnormal   Collection Time: 11/02/22  2:06 PM  Result Value Ref Range   Hgb A1c MFr Bld 5.7 (H) 4.8 - 5.6 %    Comment:          Prediabetes: 5.7 - 6.4          Diabetes: >6.4          Glycemic control for adults with diabetes: <7.0    Est. average glucose Bld gHb Est-mCnc 117 mg/dL  Vitamin Z61     Status: None   Collection Time: 11/02/22  2:06 PM  Result Value Ref Range   Vitamin B-12 361 232 - 1,245 pg/mL  T4     Status: None   Collection Time: 11/02/22  2:06 PM  Result Value Ref Range   T4, Total 8.6 4.5 - 12.0 ug/dL  T3     Status: None   Collection Time: 11/02/22  2:06 PM  Result Value Ref Range   T3, Total 87 71 - 180 ng/dL  Specimen status report     Status: None   Collection Time: 11/02/22  2:06 PM  Result Value Ref Range   specimen status report Comment     Comment: Written Authorization Written Authorization Written Authorization Received. Authorization received from [employer]  for [employer] on 11-03-2022 Logged by Adria Dill        Assessment & Plan:   Problem List Items Addressed This Visit       Active Problems   MDD (major depressive disorder), recurrent episode, moderate (HCC)    Patient stable.  Well controlled with current therapy.   Continue current meds.        Intermittent claudication (HCC) - Primary    Getting ABI to evaluate.  Will get CTA if abnormal.         Relevant Medications   baclofen (LIORESAL) 10 MG tablet   Other Relevant Orders   US ARTERIAL ABI (SCREENING LOWER EXTREMITY) (Completed)   B12 deficiency    B12 injection given in office today.  Will repeat in 1 month.   Will recheck when labs are due again      Relevant Orders   Vitamin B12     Return to be determined based on results.  Total time spent: 20 minutes  Miki Kins, FNP  12/05/2022   This document may have been prepared by [facility] Voice Recognition software and as such may include unintentional dictation errors.

## 2022-12-18 NOTE — Assessment & Plan Note (Signed)
Getting ABI to evaluate.  Will get CTA if abnormal.

## 2022-12-18 NOTE — Assessment & Plan Note (Signed)
B12 injection given in office today.  Will repeat in 1 month.   Will recheck when labs are due again

## 2022-12-21 ENCOUNTER — Ambulatory Visit: Payer: Self-pay

## 2022-12-21 NOTE — Patient Instructions (Signed)
Visit Information  Thank you for taking time to visit with me today. Please don't hesitate to contact me if I can be of assistance to you.   Following are the goals we discussed today:   Goals Addressed             This Visit's Progress    Post hospital follow up / management of health conditions       Interventions Today    Flowsheet Row Most Recent Value  Chronic Disease   Chronic disease during today's visit Chronic Obstructive Pulmonary Disease (COPD), Congestive Heart Failure (CHF), Other  [right ankle pain]  General Interventions   General Interventions Discussed/Reviewed General Interventions Reviewed, Doctor Visits, Durable Medical Equipment (DME)  [evaluation of treatment plan for mentioned health conditions and patients adherence to plan as established by provider.  Assessed for HF/ COPD symptoms and for ongoing ankle pain]  Doctor Visits Discussed/Reviewed Doctor Visits Reviewed  [discussed most recent primary care provider visit.  Reviewed upcoming provider visits.]  Durable Medical Equipment (DME) Oxygen  [patient advised to contact Oxygen DME supplier to inquire how to charge portable oxygen unit.]  Education Interventions   Education Provided Provided Education  [Advised patient to obtain pulse oximeter to monitor oxygen levels.  Advised to use insurance plan benefit to purchase pulse oximeter.]  Provided Verbal Education On Other, When to see the doctor  [Reviewed HF and COPD signs/ symptoms.  Advised to notify provider for worsening symptoms early.  Advised to call 911 for severe symptoms.]  Pharmacy Interventions   Pharmacy Dicussed/Reviewed --  [medications reviewed.  Discussed new medication baclofen. Advised patient to notify provider if baclofen not effective.]              Our next appointment is by telephone on 02/08/23 at 10 am  Please call the care guide team at [phone] if you need to cancel or reschedule your appointment.   If you are  experiencing a Mental Health or Behavioral Health Crisis or need someone to talk to, please call the Suicide and Crisis Lifeline: 988 call 1-800-273-TALK (toll free, 24 hour hotline)  Patient verbalizes understanding of instructions and care plan provided today and agrees to view in MyChart. Active MyChart status and patient understanding of how to access instructions and care plan via MyChart confirmed with patient.     George Ina RN,BSN,CCM [facility] Care Coordination [phone] direct line

## 2022-12-21 NOTE — Patient Outreach (Signed)
Care Coordination   Follow Up Visit Note   12/21/2022 Name: Bruce [name] MRN: [id number] DOB: [birth year]  Bruce [name] is a 77 y.o. year old male who sees Miki Kins, FNP for primary care. I spoke with  Bruce [name] by phone today.  What matters to the patients health and wellness today?  Patient reports having visit with primary care provider due to leg pain. He states he was prescribed baclofen to take at night prior to bed due to the pain being worse at that time.  Patient reports minimum relief with this medication.  Patient denies any increase in HF/ COPD symptoms.  Patient states if he walks from the house to his vehicle he is completely out of breath.  He states this is baseline for him.  He reports ongoing use of his oxygen at 3 L.  Patient asked if he could charge his portable oxygen tank while it is on.  He states currently he is only able to charge when tank is off.     Goals Addressed             This Visit's Progress    Post hospital follow up / management of health conditions       Interventions Today    Flowsheet Row Most Recent Value  Chronic Disease   Chronic disease during today's visit Chronic Obstructive Pulmonary Disease (COPD), Congestive Heart Failure (CHF), Other  [right ankle pain]  General Interventions   General Interventions Discussed/Reviewed General Interventions Reviewed, Doctor Visits, Durable Medical Equipment (DME)  [evaluation of treatment plan for mentioned health conditions and patients adherence to plan as established by provider.  Assessed for HF/ COPD symptoms and for ongoing ankle pain]  Doctor Visits Discussed/Reviewed Doctor Visits Reviewed  [discussed most recent primary care provider visit.  Reviewed upcoming provider visits.]  Durable Medical Equipment (DME) Oxygen  [patient advised to contact Oxygen DME supplier to inquire how to charge portable oxygen unit.]  Education Interventions   Education Provided Provided Education   [Advised patient to obtain pulse oximeter to monitor oxygen levels.  Advised to use insurance plan benefit to purchase pulse oximeter.]  Provided Verbal Education On Other, When to see the doctor  [Reviewed HF and COPD signs/ symptoms.  Advised to notify provider for worsening symptoms early.  Advised to call 911 for severe symptoms.]  Pharmacy Interventions   Pharmacy Dicussed/Reviewed --  [medications reviewed.  Discussed new medication baclofen. Advised patient to notify provider if baclofen not effective.]              SDOH assessments and interventions completed:  No     Care Coordination Interventions:  Yes, provided   Follow up plan: Follow up call scheduled for 02/07/09    Encounter Outcome:  Patient Visit Completed   George Ina RN,BSN,CCM [facility] Care Coordination [phone] direct line

## 2022-12-27 ENCOUNTER — Telehealth (HOSPITAL_COMMUNITY): Payer: Self-pay | Admitting: Emergency Medicine

## 2022-12-27 NOTE — Telephone Encounter (Signed)
Attempted calling [place] 2x and [place]'s friend [name] no answer and unable to leave a VM. Will continue to try to reach out.   [name] EMT-P Community Paramedic  [phone]

## 2022-12-28 ENCOUNTER — Other Ambulatory Visit (HOSPITAL_COMMUNITY): Payer: Self-pay | Admitting: Emergency Medicine

## 2022-12-28 NOTE — Progress Notes (Signed)
Paramedicine Encounter    Patient ID: Bruce [name], male    DOB: [birth year], 77 y.o.   MRN: [id number]   Complaints -increased exertional shortness of breath. Increased acid reflux   Assessment - lung sounds clear, no edema noted.   Compliance with meds - Missed 2x days worth of medication, improvement from past efforts   Pill box filled - for 2x weeks   Refills needed - baclofen  Meds changes since last visit -  None   Social changes - Pt refuses to buy minutes for the phone supplied by the [facility]. Pt has no phone at present. All communication has to go through [place]. Pt also advises that he may be moving to [place]. Was advised to keep me updated of same.   VISIT SUMMARY**  Met with Bruce [name] today in the home. Pt reported increased compliance with his medication and was proud of same. Pt presented with exertional shortness of breath. Pt used oxygen throughout visit and was unable to come off it for long periods of time. This is the first visit he has worn oxygen during, though he reports he wears it constantly. Pt's oxygen saturations were within normal limits and lung sounds clear. Pt reports worsening exertional shortness of breath and proved it when he stood to weigh and became tachypneic with obvious dyspnea. Pt's pill box was filled for 2x weeks. Upcoming appointments reviewed. Will follow up in 2x weeks.    BP 130/70   Pulse 62   Resp 18   Wt 156 lb 12.8 oz (71.1 kg)   SpO2 98% Comment: on 3L oxygen  BMI 20.69 kg/m  Weight yesterday-DNW Last visit weight-158 lbs   [name] EMT-P Community Paramedic  [phone]     ACTION: Home visit completed     Patient Care Team: Miki Kins, FNP as PCP - General (Family Medicine) Laurier Nancy, MD as PCP - Cardiology (Cardiology) Otho Ket, RN as [facility] Care Management  Patient Active Problem List   Diagnosis Date Noted   Intermittent claudication (HCC) 12/18/2022    B12 deficiency 12/18/2022   MDD (major depressive disorder), recurrent episode, moderate (HCC) 09/01/2022   Pulmonary hypertension, unspecified (HCC) 04/14/2022   Chronic systolic heart failure (HCC) 04/13/2022   Pulmonary embolism (HCC) 04/09/2022   PAF (paroxysmal atrial fibrillation) (HCC)    NSTEMI (non-ST elevated myocardial infarction) (HCC) 10/10/2018   Syncope 04/28/2018   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   Limb ischemia 11/18/2013   Acute blood loss anemia 10/08/2013   Anxiety disorder 10/08/2013   Dyslipidemia 10/08/2013   GERD (gastroesophageal reflux disease) 10/08/2013   Anticoagulated 10/08/2013   Hypertension    CAD (coronary artery disease) 08/16/2010   COPD (chronic obstructive pulmonary disease) (HCC) 08/16/2010   DVT of leg (deep venous thrombosis) (HCC) 08/16/2010    Current Outpatient Medications:    albuterol (VENTOLIN HFA) 108 (90 Base) MCG/ACT inhaler, TAKE TWO PUFFS BY MOUTH AS NEEDED FOR WHEEZING EVERY 4-6 HOURS, Disp: 8.5 g, Rfl: 5   ALPRAZolam (XANAX) 0.25 MG tablet, TAKE ONE TABLET BY MOUTH ONCE A DAY AS NEEDED FOR ANXIETY, Disp: 30 tablet, Rfl: 2   amiodarone (PACERONE) 200 MG tablet, Take 0.5 tablets (100 mg total) by mouth daily., Disp: 15 tablet, Rfl: 11   apixaban (ELIQUIS) 5 MG TABS tablet, TAKE ONE TABLET (5 MG TOTAL) BY MOUTH TWO (TWO) TIMES DAILY., Disp: 60 tablet, Rfl: 11   baclofen (LIORESAL) 10 MG tablet, Take 1 tablet (10  mg total) by mouth at bedtime as needed for muscle spasms., Disp: 30 tablet, Rfl: 0   cetirizine (ZYRTEC) 10 MG tablet, Take 1 tablet (10 mg total) by mouth daily., Disp: 30 tablet, Rfl: 11   cholecalciferol (VITAMIN D3) 25 MCG (1000 UNIT) tablet, Take 1,000 Units by mouth daily., Disp: , Rfl:    digoxin (LANOXIN) 0.125 MG tablet, Take 1 tablet (0.125 mg total) by mouth daily., Disp: 30 tablet, Rfl: 6   FARXIGA 10 MG TABS tablet, TAKE ONE TABLET (10 MG TOTAL) BY MOUTH DAILY., Disp: 90 tablet, Rfl: 1   fluticasone (FLONASE) 50  MCG/ACT nasal spray, BENDING HEAD FORWARD SPRAY ONE SPRAY INTO EACH NOSTRIL TWICE DAILY, Disp: 16 mL, Rfl: 5   lidocaine (LIDODERM) 5 %, Place 1 patch onto the skin daily. Remove & Discard patch within 12 hours or as directed by MD, Disp: 30 patch, Rfl: 11   losartan (COZAAR) 25 MG tablet, TAKE 0.5 TABLETS (12.5 MG TOTAL) BY MOUTH AT BEDTIME., Disp: 50 tablet, Rfl: 3   Multiple Vitamin (MULTIVITAMIN) tablet, Take 1 tablet by mouth daily. For Men, Disp: , Rfl:    nitroGLYCERIN (NITROSTAT) 0.4 MG SL tablet, Place 0.4 mg under the tongue every 5 (five) minutes as needed for chest pain., Disp: , Rfl:    Omega-3 Fatty Acids (FISH OIL PO), Take 1,000 mg by mouth every evening., Disp: , Rfl:    omeprazole (PRILOSEC) 40 MG capsule, Take 40 mg by mouth daily., Disp: , Rfl:    potassium chloride SA (KLOR-CON M) 20 MEQ tablet, Take 4 tablets (80 mEq total) by mouth daily for 1 day, THEN 2 tablets (40 mEq total) daily., Disp: 64 tablet, Rfl: 3   ranolazine (RANEXA) 500 MG 12 hr tablet, Take 1 tablet (500 mg total) by mouth 2 (two) times daily., Disp: 60 tablet, Rfl: 6   rosuvastatin (CRESTOR) 10 MG tablet, TAKE ONE TABLET (10 MG TOTAL) BY MOUTH DAILY., Disp: 100 tablet, Rfl: 3   sildenafil (REVATIO) 20 MG tablet, Take 1 tablet (20 mg total) by mouth 3 (three) times daily., Disp: 90 tablet, Rfl: 6   Tiotropium Bromide-Olodaterol (STIOLTO RESPIMAT) 2.5-2.5 MCG/ACT AERS, Inhale 2 puffs into the lungs daily., Disp: 60 each, Rfl: 12 Allergies  Allergen Reactions   Adhesive [Tape] Other (See Comments)    After right leg fracture surgery, pt developed a large blister where tape was applied to his right leg. OK to use paper tape.   Imdur [Isosorbide Dinitrate] Other (See Comments)    hallucinations   Singulair [Montelukast Sodium] Other (See Comments)    Hallucinations      Social History   Socioeconomic History   Marital status: Single    Spouse name: Not on file   Number of children: 2   Years of  education: Not on file   Highest education level: 6th grade  Occupational History   Not on file  Tobacco Use   Smoking status: Former    Current packs/day: 0.00    Average packs/day: 0.5 packs/day for 52.0 years (26.0 ttl pk-yrs)    Types: Cigarettes    Start date: 07/05/1970    Quit date: 07/05/2022    Years since quitting: 0.4   Smokeless tobacco: Never  Vaping Use   Vaping status: Never Used  Substance and Sexual Activity   Alcohol use: No    Alcohol/week: 0.0 standard drinks of alcohol    Comment: Stopped 2009   Drug use: No   Sexual activity: Not Currently  Other Topics Concern   Not on file  Social History Narrative   ** Merged History Encounter **       Social Determinants of Health   Financial Resource Strain: High Risk (04/12/2022)   Overall Financial Resource Strain (CARDIA)    Difficulty of Paying Living Expenses: Hard  Food Insecurity: Food Insecurity Present (08/03/2022)   Hunger Vital Sign    Worried About Running Out of Food in the Last Year: Sometimes true    Ran Out of Food in the Last Year: Sometimes true  Transportation Needs: No Transportation Needs (05/23/2022)   PRAPARE - Administrator, Civil Service (Medical): No    Lack of Transportation (Non-Medical): No  Physical Activity: Inactive (02/05/2017)   Exercise Vital Sign    Days of Exercise per Week: 0 days    Minutes of Exercise per Session: 0 min  Stress: Stress Concern Present (02/05/2017)   [employer] of Occupational Health - Occupational Stress Questionnaire    Feeling of Stress : Rather much  Social Connections: Moderately Isolated (02/05/2017)   Social Connection and Isolation Panel [NHANES]    Frequency of Communication with Friends and Family: More than three times a week    Frequency of Social Gatherings with Friends and Family: Once a week    Attends Religious Services: Never    Database administrator or Organizations: No    Attends Banker Meetings: Never     Marital Status: Divorced  Catering manager Violence: Not At Risk (04/14/2022)   Humiliation, Afraid, Rape, and Kick questionnaire    Fear of Current or Ex-Partner: No    Emotionally Abused: No    Physically Abused: No    Sexually Abused: No    Physical Exam      Future Appointments  Date Time Provider Department Center  01/15/2023  2:00 PM [facility] ECHO OP 1 [facility]  01/15/2023  3:00 PM [facility] PA/NP [facility] None  02/05/2023  9:00 AM Miki Kins, FNP A[facility]-AMA None  02/08/2023 10:00 AM Otho Ket, RN THN-CCC None  02/19/2023 10:15 AM Raechel Chute, MD [facility] None

## 2022-12-29 ENCOUNTER — Telehealth: Payer: Self-pay | Admitting: Family

## 2022-12-29 NOTE — Telephone Encounter (Signed)
[name], a Freight forwarder, left VM stating she has a question about the patient's med list. Would like a call back at [phone]. Stated she is off on Friday but we can return her call on Monday, 10/28.

## 2023-01-02 ENCOUNTER — Other Ambulatory Visit: Payer: Self-pay | Admitting: Cardiology

## 2023-01-02 ENCOUNTER — Other Ambulatory Visit: Payer: Self-pay

## 2023-01-02 ENCOUNTER — Telehealth: Payer: Self-pay

## 2023-01-02 DIAGNOSIS — J9601 Acute respiratory failure with hypoxia: Secondary | ICD-10-CM | POA: Diagnosis not present

## 2023-01-02 NOTE — Telephone Encounter (Signed)
Can send a Epic message to Hassell Done with response.

## 2023-01-03 ENCOUNTER — Other Ambulatory Visit: Payer: Self-pay | Admitting: Family

## 2023-01-10 ENCOUNTER — Other Ambulatory Visit (HOSPITAL_COMMUNITY): Payer: Self-pay | Admitting: Internal Medicine

## 2023-01-10 ENCOUNTER — Other Ambulatory Visit: Payer: Self-pay | Admitting: Family

## 2023-01-10 MED ORDER — PANTOPRAZOLE SODIUM 40 MG PO TBEC: 40.0000 mg | DELAYED_RELEASE_TABLET | Freq: Every day | ORAL | 3 refills | Status: DC

## 2023-01-11 ENCOUNTER — Other Ambulatory Visit (HOSPITAL_COMMUNITY): Payer: Self-pay | Admitting: Emergency Medicine

## 2023-01-11 NOTE — Progress Notes (Signed)
Paramedicine Encounter    Patient ID: Bruce [name], male    DOB: 1945-12-15, 77 y.o.   MRN: [id number]   Complaints- continued shortness of breath   Assessment - Lung sounds decreased, no rales, no edema    Compliance with meds - missed 2 days worth of medication - revisited the idea of bubble packs post AHF appointment on Monday   Pill box filled - for 2x weeks   Refills needed - Requip  Meds changes since last visit - discontinued omeprazole and colase, started pantoprazole and requip.     Social changes - refuses to put minutes on trac phone given to  him by the clinic. Continued difficulty getting in touch with him.    VISIT SUMMARY**  Met with Bruce [name] in his home. Pt denied any complaints today, but appeared to be short of breath. Pt did not have his oxygen and advised that he had just exerted himself. Oxygen saturations obtained as noted. Pt denied any episodes of chest pains, palpitations, dizziness, or shortness of breath. Pt reports improved effort on medication compliance. Pt was assessed as noted with no acute abnormalities. Pt's box was filled for 2x weeks. Upcoming appointments were reviewed. Will follow up in the clinic on Monday for appointment.   BP 122/78   Pulse 62   Resp 16   Wt 158 lb (71.7 kg)   SpO2 96%   BMI 20.85 kg/m  Weight yesterday-[id number] Last visit weight-156 lbs   [name] EMT-P Community Paramedic  [phone]     ACTION: Home visit completed     Patient Care Team: Miki Kins, FNP as PCP - General (Family Medicine) Laurier Nancy, MD as PCP - Cardiology (Cardiology) Otho Ket, RN as [facility] Care Management  Patient Active Problem List   Diagnosis Date Noted   Intermittent claudication (HCC) 12/18/2022   B12 deficiency 12/18/2022   MDD (major depressive disorder), recurrent episode, moderate (HCC) 09/01/2022   Pulmonary hypertension, unspecified (HCC) 04/14/2022   Chronic systolic heart  failure (HCC) 03/14/3233   Pulmonary embolism (HCC) 04/09/2022   PAF (paroxysmal atrial fibrillation) (HCC)    NSTEMI (non-ST elevated myocardial infarction) (HCC) 10/10/2018   Syncope 04/28/2018   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   Limb ischemia 11/18/2013   Acute blood loss anemia 10/08/2013   Anxiety disorder 10/08/2013   Dyslipidemia 10/08/2013   GERD (gastroesophageal reflux disease) 10/08/2013   Anticoagulated 10/08/2013   Hypertension    CAD (coronary artery disease) 08/16/2010   COPD (chronic obstructive pulmonary disease) (HCC) 08/16/2010   DVT of leg (deep venous thrombosis) (HCC) 08/16/2010    Current Outpatient Medications:    ALPRAZolam (XANAX) 0.25 MG tablet, TAKE ONE TABLET BY MOUTH ONCE A DAY AS NEEDED FOR ANXIETY, Disp: 30 tablet, Rfl: 2   amiodarone (PACERONE) 200 MG tablet, Take 0.5 tablets (100 mg total) by mouth daily., Disp: 15 tablet, Rfl: 11   apixaban (ELIQUIS) 5 MG TABS tablet, TAKE ONE TABLET (5 MG TOTAL) BY MOUTH TWO (TWO) TIMES DAILY., Disp: 60 tablet, Rfl: 11   baclofen (LIORESAL) 10 MG tablet, TAKE ONE TABLET (10 MG TOTAL) BY MOUTH AT BEDTIME AS NEEDED FOR MUSCLE SPASMS., Disp: 30 tablet, Rfl: 0   cetirizine (ZYRTEC) 10 MG tablet, Take 1 tablet (10 mg total) by mouth daily., Disp: 30 tablet, Rfl: 11   cholecalciferol (VITAMIN D3) 25 MCG (1000 UNIT) tablet, Take 1,000 Units by mouth daily., Disp: , Rfl:    digoxin (LANOXIN) 0.125 MG tablet, Take  1 tablet (0.125 mg total) by mouth daily., Disp: 30 tablet, Rfl: 6   FARXIGA 10 MG TABS tablet, TAKE ONE TABLET (10 MG TOTAL) BY MOUTH DAILY., Disp: 90 tablet, Rfl: 1   fluticasone (FLONASE) 50 MCG/ACT nasal spray, BENDING HEAD FORWARD SPRAY ONE SPRAY INTO EACH NOSTRIL TWICE DAILY, Disp: 16 mL, Rfl: 5   lidocaine (LIDODERM) 5 %, Place 1 patch onto the skin daily. Remove & Discard patch within 12 hours or as directed by MD, Disp: 30 patch, Rfl: 11   losartan (COZAAR) 25 MG tablet, TAKE 0.5 TABLETS (12.5 MG TOTAL) BY MOUTH  AT BEDTIME., Disp: 50 tablet, Rfl: 3   Multiple Vitamin (MULTIVITAMIN) tablet, Take 1 tablet by mouth daily. For Men, Disp: , Rfl:    nitroGLYCERIN (NITROSTAT) 0.4 MG SL tablet, Place 0.4 mg under the tongue every 5 (five) minutes as needed for chest pain., Disp: , Rfl:    Omega-3 Fatty Acids (FISH OIL PO), Take 1,000 mg by mouth every evening., Disp: , Rfl:    pantoprazole (PROTONIX) 40 MG tablet, Take 1 tablet (40 mg total) by mouth daily., Disp: 90 tablet, Rfl: 3   potassium chloride SA (KLOR-CON M) 20 MEQ tablet, Take 4 tablets (80 mEq total) by mouth daily for 1 day, THEN 2 tablets (40 mEq total) daily., Disp: 64 tablet, Rfl: 3   ranolazine (RANEXA) 500 MG 12 hr tablet, Take 1 tablet (500 mg total) by mouth 2 (two) times daily., Disp: 60 tablet, Rfl: 6   rOPINIRole (REQUIP) 1 MG tablet, Take 1 mg by mouth 3 (three) times daily., Disp: , Rfl:    rosuvastatin (CRESTOR) 10 MG tablet, TAKE ONE TABLET (10 MG TOTAL) BY MOUTH DAILY., Disp: 100 tablet, Rfl: 3   sildenafil (REVATIO) 20 MG tablet, Take 1 tablet (20 mg total) by mouth 3 (three) times daily., Disp: 90 tablet, Rfl: 6   albuterol (VENTOLIN HFA) 108 (90 Base) MCG/ACT inhaler, TAKE TWO PUFFS BY MOUTH AS NEEDED FOR WHEEZING EVERY 4-6 HOURS, Disp: 8.5 g, Rfl: 5   Tiotropium Bromide-Olodaterol (STIOLTO RESPIMAT) 2.5-2.5 MCG/ACT AERS, Inhale 2 puffs into the lungs daily., Disp: 60 each, Rfl: 12 Allergies  Allergen Reactions   Adhesive [Tape] Other (See Comments)    After right leg fracture surgery, pt developed a large blister where tape was applied to his right leg. OK to use paper tape.   Imdur [Isosorbide Dinitrate] Other (See Comments)    hallucinations   Singulair [Montelukast Sodium] Other (See Comments)    Hallucinations      Social History   Socioeconomic History   Marital status: Single    Spouse name: Not on file   Number of children: 2   Years of education: Not on file   Highest education level: 6th grade  Occupational  History   Not on file  Tobacco Use   Smoking status: Former    Current packs/day: 0.00    Average packs/day: 0.5 packs/day for 52.0 years (26.0 ttl pk-yrs)    Types: Cigarettes    Start date: 07/05/1970    Quit date: 07/05/2022    Years since quitting: 0.5   Smokeless tobacco: Never  Vaping Use   Vaping status: Never Used  Substance and Sexual Activity   Alcohol use: No    Alcohol/week: 0.0 standard drinks of alcohol    Comment: Stopped 2009   Drug use: No   Sexual activity: Not Currently  Other Topics Concern   Not on file  Social History Narrative   **  Merged History Encounter **       Social Determinants of Health   Financial Resource Strain: High Risk (04/12/2022)   Overall Financial Resource Strain (CARDIA)    Difficulty of Paying Living Expenses: Hard  Food Insecurity: Food Insecurity Present (08/03/2022)   Hunger Vital Sign    Worried About Running Out of Food in the Last Year: Sometimes true    Ran Out of Food in the Last Year: Sometimes true  Transportation Needs: No Transportation Needs (05/23/2022)   PRAPARE - Administrator, Civil Service (Medical): No    Lack of Transportation (Non-Medical): No  Physical Activity: Inactive (02/05/2017)   Exercise Vital Sign    Days of Exercise per Week: 0 days    Minutes of Exercise per Session: 0 min  Stress: Stress Concern Present (02/05/2017)   [employer] of Occupational Health - Occupational Stress Questionnaire    Feeling of Stress : Rather much  Social Connections: Moderately Isolated (02/05/2017)   Social Connection and Isolation Panel [NHANES]    Frequency of Communication with Friends and Family: More than three times a week    Frequency of Social Gatherings with Friends and Family: Once a week    Attends Religious Services: Never    Database administrator or Organizations: No    Attends Banker Meetings: Never    Marital Status: Divorced  Catering manager Violence: Not At Risk (04/14/2022)    Humiliation, Afraid, Rape, and Kick questionnaire    Fear of Current or Ex-Partner: No    Emotionally Abused: No    Physically Abused: No    Sexually Abused: No    Physical Exam      Future Appointments  Date Time Provider Department Center  01/15/2023  2:00 PM [facility] ECHO OP 1 [facility] [place]  01/15/2023  3:00 PM [facility] PA/NP [facility] None  02/05/2023  9:00 AM Miki Kins, FNP AMA-AMA None  02/08/2023 10:00 AM Otho Ket, RN [facility] None  02/19/2023 10:15 AM Raechel Chute, MD [facility] None

## 2023-01-12 ENCOUNTER — Telehealth (HOSPITAL_COMMUNITY): Payer: Self-pay

## 2023-01-12 NOTE — Telephone Encounter (Signed)
Called patient to confirm/remind patient of their appointment [facility] on 01/15/23. However, both of his phone numbers in his chart were invalid.

## 2023-01-15 ENCOUNTER — Other Ambulatory Visit: Payer: Self-pay | Admitting: Cardiology

## 2023-01-15 ENCOUNTER — Ambulatory Visit (HOSPITAL_BASED_OUTPATIENT_CLINIC_OR_DEPARTMENT_OTHER)
Admission: RE | Admit: 2023-01-15 | Discharge: 2023-01-15 | Disposition: A | Discharge status: Home / Self Care | Payer: 59 | Source: Ambulatory Visit | Attending: Cardiology | Admitting: Cardiology

## 2023-01-15 ENCOUNTER — Ambulatory Visit (HOSPITAL_COMMUNITY)
Admission: RE | Admit: 2023-01-15 | Discharge: 2023-01-15 | Disposition: A | Discharge status: Home / Self Care | Payer: 59 | Source: Ambulatory Visit | Attending: Cardiology | Admitting: Cardiology

## 2023-01-15 ENCOUNTER — Encounter (HOSPITAL_COMMUNITY): Payer: Self-pay

## 2023-01-15 VITALS — BP 132/76 | HR 77 | Wt 157.2 lb

## 2023-01-15 DIAGNOSIS — Z86718 Personal history of other venous thrombosis and embolism: Secondary | ICD-10-CM | POA: Insufficient documentation

## 2023-01-15 DIAGNOSIS — I2782 Chronic pulmonary embolism: Secondary | ICD-10-CM | POA: Insufficient documentation

## 2023-01-15 DIAGNOSIS — J9611 Chronic respiratory failure with hypoxia: Secondary | ICD-10-CM

## 2023-01-15 DIAGNOSIS — I428 Other cardiomyopathies: Secondary | ICD-10-CM | POA: Insufficient documentation

## 2023-01-15 DIAGNOSIS — I480 Paroxysmal atrial fibrillation: Secondary | ICD-10-CM | POA: Insufficient documentation

## 2023-01-15 DIAGNOSIS — Z5941 Food insecurity: Secondary | ICD-10-CM | POA: Diagnosis not present

## 2023-01-15 DIAGNOSIS — Z7901 Long term (current) use of anticoagulants: Secondary | ICD-10-CM | POA: Insufficient documentation

## 2023-01-15 DIAGNOSIS — I2781 Cor pulmonale (chronic): Secondary | ICD-10-CM | POA: Diagnosis not present

## 2023-01-15 DIAGNOSIS — I272 Pulmonary hypertension, unspecified: Secondary | ICD-10-CM | POA: Insufficient documentation

## 2023-01-15 DIAGNOSIS — Z85118 Personal history of other malignant neoplasm of bronchus and lung: Secondary | ICD-10-CM | POA: Insufficient documentation

## 2023-01-15 DIAGNOSIS — Z87891 Personal history of nicotine dependence: Secondary | ICD-10-CM | POA: Insufficient documentation

## 2023-01-15 DIAGNOSIS — J449 Chronic obstructive pulmonary disease, unspecified: Secondary | ICD-10-CM | POA: Insufficient documentation

## 2023-01-15 DIAGNOSIS — I70219 Atherosclerosis of native arteries of extremities with intermittent claudication, unspecified extremity: Secondary | ICD-10-CM | POA: Insufficient documentation

## 2023-01-15 DIAGNOSIS — I5082 Biventricular heart failure: Secondary | ICD-10-CM | POA: Diagnosis not present

## 2023-01-15 DIAGNOSIS — I2510 Atherosclerotic heart disease of native coronary artery without angina pectoris: Secondary | ICD-10-CM | POA: Diagnosis not present

## 2023-01-15 DIAGNOSIS — F17210 Nicotine dependence, cigarettes, uncomplicated: Secondary | ICD-10-CM | POA: Insufficient documentation

## 2023-01-15 DIAGNOSIS — C61 Malignant neoplasm of prostate: Secondary | ICD-10-CM

## 2023-01-15 DIAGNOSIS — Z8546 Personal history of malignant neoplasm of prostate: Secondary | ICD-10-CM | POA: Diagnosis not present

## 2023-01-15 DIAGNOSIS — I5022 Chronic systolic (congestive) heart failure: Secondary | ICD-10-CM | POA: Diagnosis not present

## 2023-01-15 DIAGNOSIS — I739 Peripheral vascular disease, unspecified: Secondary | ICD-10-CM

## 2023-01-15 DIAGNOSIS — R9431 Abnormal electrocardiogram [ECG] [EKG]: Secondary | ICD-10-CM | POA: Diagnosis not present

## 2023-01-15 DIAGNOSIS — Z720 Tobacco use: Secondary | ICD-10-CM

## 2023-01-15 DIAGNOSIS — Z85038 Personal history of other malignant neoplasm of large intestine: Secondary | ICD-10-CM | POA: Insufficient documentation

## 2023-01-15 LAB — CBC
HCT: 41.5 % (ref 39.0–52.0)
Hemoglobin: 13.5 g/dL (ref 13.0–17.0)
MCH: 31.1 pg (ref 26.0–34.0)
MCHC: 32.5 g/dL (ref 30.0–36.0)
MCV: 95.6 fL (ref 80.0–100.0)
Platelets: 249 10*3/uL (ref 150–400)
RBC: 4.34 MIL/uL (ref 4.22–5.81)
RDW: 13.9 % (ref 11.5–15.5)
WBC: 6.3 10*3/uL (ref 4.0–10.5)
nRBC: 0 % (ref 0.0–0.2)

## 2023-01-15 LAB — BRAIN NATRIURETIC PEPTIDE: B Natriuretic Peptide: 444.3 pg/mL — ABNORMAL HIGH (ref 0.0–100.0)

## 2023-01-15 LAB — DIGOXIN LEVEL: Digoxin Level: <0.2 ng/mL — ABNORMAL LOW (ref 0.8–2.0)

## 2023-01-15 LAB — BASIC METABOLIC PANEL
Anion gap: 9 (ref 5–15)
BUN: 9 mg/dL (ref 8–23)
CO2: 27 mmol/L (ref 22–32)
Calcium: 9.7 mg/dL (ref 8.9–10.3)
Chloride: 105 mmol/L (ref 98–111)
Creatinine, Ser: 1.11 mg/dL (ref 0.61–1.24)
GFR, Estimated: >60 mL/min (ref >60)
Glucose, Bld: 89 mg/dL (ref 70–99)
Potassium: 4.7 mmol/L (ref 3.5–5.1)
Sodium: 141 mmol/L (ref 135–145)

## 2023-01-15 MED ORDER — SILDENAFIL CITRATE 20 MG PO TABS: 20.0000 mg | ORAL_TABLET | Freq: Three times a day (TID) | ORAL | 5 refills | Status: DC

## 2023-01-15 NOTE — Progress Notes (Signed)
  Echocardiogram 2D Echocardiogram has been performed.  Delcie Roch 01/15/2023, 3:02 PM

## 2023-01-15 NOTE — Progress Notes (Signed)
ADVANCED HF CLINIC NOTE  PCP: [name] NP Radiation Oncology: Dr Rushie Chestnut HF Cardiologist: Dr Gala Romney    HPI:  [name] is a 77 y.o. with COPD and ongoing tobacco use, CAD, systolic HF EF 40-45%, colon CA previous lung CA, prostate cancer, PE, DVT, and HFrEF.   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 Missed several doses of Xarelto prior to admit. Also had a/c LE DVT.  EF also found to be down to 20-25%. RV severely reduced. Cath minimal CAD, moderate to severe PAH with evidence of cor pulmonale (LPAP > RPAP - checked multiple times). GDMT limited by BP. Entresto lowered BP to much so he was switched to losartan. Discharged 04/18/22 to SNF. Discharge to home 05/05/22.   He was seen in the HF clinic 05/25/22. Cleda Daub was cut back 12.5 mg at bedtime then later stopped due to hyperkalemia.   Dr. Gala Romney saw him in 4/24 and POCUS showed severe RV dysfunction and LVEF 40% (back to baseline). Suspicion was for severe underlying PAH/cor pulmonale due to end-stage COPD. PFTs and VQ ordered  VQ 5/24: Multiple peripheral perfusion defects in both lungs, indeterminate for recurrent acute pulmonary embolism -> CT scan recommended.  CT chest 5/24: Severe COPD. The component of thrombus in the right main PA has essentially resolved since the prior CTA with a small amount of anterior chronic mural thrombus remaining. Distally, occlusive chronic thrombus remains in the interlobar PA extending into RLL and RML pulmonary arteries. This does not have the appearance of acute thrombus and appears to be chronic obstructive thrombus. At the time of thrombectomy, there was clearly a component of significant chronic thrombus in the interlobar artery  Saw Dr. Aundria Rud 5/24 and PFTs ordered. SOB with minimal exertion. Gets lightheaded when standing. Minimal LE edema. Cut cigs back to 2 cigs/day. No CP.   Today he returns for HF follow up  with paramedic. Overall feeling fair. He is SOB with walking short distances on flat ground. He wears 3L oxygen continuously.  He has positional dizziness, falls 1-2x/month. Denies palpitations, abnormal bleeding, CP, edema, or PND/Orthopnea. Appetite ok. No fever or chills. Weight at home 157 pounds. Taking medications 75% of the time. Smoking 4-5 cigs/day, no drugs or ETOH. Still having claudication pain in legs, R>L.  Echo today 01/15/23, reviewed with Dr. Shirlee Latch. EF appears ~ 40%, RV remains severely depressed.  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 Ao = 122/78 (96) LV = 123/6 RA = 4 RV = 77/8 LPA = 73/23 (39) RPA = 85/24 (44)  PCW = 16 Fick cardiac output/index = 4.3/2.2 PVR = 5.0  Ao sat = 90% PA sat = 51%, 50%  - Echo (2/24): EF 20-25%, mildly D-shaped IV septum with severely reduced RV, mild to moderate TR  ROS: All systems negative except as listed in HPI, PMH and Problem List.  SH:  Social History   Socioeconomic History   Marital status: Single    Spouse name: Not on file   Number of children: 2   Years of education: Not on file   Highest education level: 6th grade  Occupational History   Not on file  Tobacco Use   Smoking status: Former    Current packs/day: 0.00  Average packs/day: 0.5 packs/day for 52.0 years (26.0 ttl pk-yrs)    Types: Cigarettes    Start date: 07/05/1970    Quit date: 07/05/2022    Years since quitting: 0.5   Smokeless tobacco: Never  Vaping Use   Vaping status: Never Used  Substance and Sexual Activity   Alcohol use: No    Alcohol/week: 0.0 standard drinks of alcohol    Comment: Stopped 2009   Drug use: No   Sexual activity: Not Currently  Other Topics Concern   Not on file  Social History Narrative   ** Merged History Encounter **       Social Determinants of  Health   Financial Resource Strain: High Risk (04/12/2022)   Overall Financial Resource Strain (CARDIA)    Difficulty of Paying Living Expenses: Hard  Food Insecurity: Food Insecurity Present (08/03/2022)   Hunger Vital Sign    Worried About Running Out of Food in the Last Year: Sometimes true    Ran Out of Food in the Last Year: Sometimes true  Transportation Needs: No Transportation Needs (05/23/2022)   PRAPARE - Administrator, Civil Service (Medical): No    Lack of Transportation (Non-Medical): No  Physical Activity: Inactive (02/05/2017)   Exercise Vital Sign    Days of Exercise per Week: 0 days    Minutes of Exercise per Session: 0 min  Stress: Stress Concern Present (02/05/2017)   [employer] of Occupational Health - Occupational Stress Questionnaire    Feeling of Stress : Rather much  Social Connections: Moderately Isolated (02/05/2017)   Social Connection and Isolation Panel [NHANES]    Frequency of Communication with Friends and Family: More than three times a week    Frequency of Social Gatherings with Friends and Family: Once a week    Attends Religious Services: Never    Database administrator or Organizations: No    Attends Banker Meetings: Never    Marital Status: Divorced  Catering manager Violence: Not At Risk (04/14/2022)   Humiliation, Afraid, Rape, and Kick questionnaire    Fear of Current or Ex-Partner: No    Emotionally Abused: No    Physically Abused: No    Sexually Abused: No    FH:  Family History  Problem Relation Age of Onset   Hypertension Mother    Prostate cancer Brother    Mental illness Neg Hx     Past Medical History:  Diagnosis Date   Acute respiratory failure with hypoxia (HCC) 04/09/2022   AKI (acute kidney injury) (HCC)    Anginal pain (HCC)    Left side if chest ,NTG  relieves chaes apin 12/01/13   Anxiety    Arthritis    Auditory hallucination 03/18/2015   Blunt trauma of lower leg 10/08/2013   Cancer  (HCC)    colon cancer   CHF (congestive heart failure) (HCC)    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 01/01/2015   Complication of anesthesia    wake up with a head ache   COPD (chronic obstructive pulmonary disease) (HCC)    Coronary artery disease    Edema 08/16/2010   GERD (gastroesophageal reflux disease)    Headache    related to sinus congestion   History of blood transfusion    History of kidney stones    History of MI (myocardial infarction) 10/08/2013   Hypertension    Hypokalemia 04/09/2022   Intracranial bleed (HCC) 12/31/2018   Lactic acidosis  04/09/2022   Leg cramps 08/16/2010   Malignant neoplasm of prostate (HCC) 07/13/2016   Multiple closed facial bone fractures (HCC)    Myocardial infarction [facility])    '09 AND '12   Peripheral vascular disease (HCC)    Shortness of breath    With exertion .   Tibia/fibula fracture 10/06/2013   Tibial plateau fracture 12/29/2014   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HCC) 10/08/2013   UTI (urinary tract infection)    frequent UTI   Visual hallucination 03/18/2015    Current Outpatient Medications  Medication Sig Dispense Refill   albuterol (VENTOLIN HFA) 108 (90 Base) MCG/ACT inhaler TAKE TWO PUFFS BY MOUTH AS NEEDED FOR WHEEZING EVERY 4-6 HOURS 8.5 g 5   ALPRAZolam (XANAX) 0.25 MG tablet TAKE ONE TABLET BY MOUTH ONCE A DAY AS NEEDED FOR ANXIETY 30 tablet 2   amiodarone (PACERONE) 200 MG tablet Take 0.5 tablets (100 mg total) by mouth daily. 15 tablet 11   apixaban (ELIQUIS) 5 MG TABS tablet TAKE ONE TABLET (5 MG TOTAL) BY MOUTH TWO (TWO) TIMES DAILY. 60 tablet 11   baclofen (LIORESAL) 10 MG tablet TAKE ONE TABLET (10 MG TOTAL) BY MOUTH AT BEDTIME AS NEEDED FOR MUSCLE SPASMS. 30 tablet 0   cetirizine (ZYRTEC) 10 MG tablet Take 1 tablet (10 mg total) by mouth daily. 30 tablet 11   cholecalciferol (VITAMIN D3) 25 MCG (1000 UNIT) tablet Take 1,000 Units by mouth daily.     digoxin (LANOXIN) 0.125 MG  tablet TAKE ONE TABLET (0.125 MG TOTAL) BY MOUTH DAILY. 30 tablet 6   FARXIGA 10 MG TABS tablet TAKE ONE TABLET (10 MG TOTAL) BY MOUTH DAILY. 90 tablet 1   fluticasone (FLONASE) 50 MCG/ACT nasal spray BENDING HEAD FORWARD SPRAY ONE SPRAY INTO EACH NOSTRIL TWICE DAILY 16 mL 5   lidocaine (LIDODERM) 5 % Place 1 patch onto the skin daily. Remove & Discard patch within 12 hours or as directed by MD 30 patch 11   losartan (COZAAR) 25 MG tablet TAKE 0.5 TABLETS (12.5 MG TOTAL) BY MOUTH AT BEDTIME. 50 tablet 3   Multiple Vitamin (MULTIVITAMIN) tablet Take 1 tablet by mouth daily. For Men     nitroGLYCERIN (NITROSTAT) 0.4 MG SL tablet Place 0.4 mg under the tongue every 5 (five) minutes as needed for chest pain.     Omega-3 Fatty Acids (FISH OIL PO) Take 1,000 mg by mouth every evening.     pantoprazole (PROTONIX) 40 MG tablet Take 1 tablet (40 mg total) by mouth daily. 90 tablet 3   potassium chloride SA (KLOR-CON M) 20 MEQ tablet Take 4 tablets (80 mEq total) by mouth daily for 1 day, THEN 2 tablets (40 mEq total) daily. 64 tablet 3   ranolazine (RANEXA) 500 MG 12 hr tablet TAKE ONE TABLET (500 MG TOTAL) BY MOUTH TWO (TWO) TIMES DAILY. 60 tablet 6   rOPINIRole (REQUIP) 1 MG tablet TAKE ONE TABLET (1 MG TOTAL) BY MOUTH IN THE MORNING AND AT BEDTIME. 60 tablet 3   rosuvastatin (CRESTOR) 10 MG tablet TAKE ONE TABLET (10 MG TOTAL) BY MOUTH DAILY. 100 tablet 3   sildenafil (REVATIO) 20 MG tablet Take 1 tablet (20 mg total) by mouth 3 (three) times daily. 90 tablet 6   Tiotropium Bromide-Olodaterol (STIOLTO RESPIMAT) 2.5-2.5 MCG/ACT AERS Inhale 2 puffs into the lungs daily. 60 each 12   No current facility-administered medications for this encounter.   BP 132/76   Pulse 77   Wt 71.3 kg (157 lb 3.2 oz)  SpO2 98%   BMI 20.74 kg/m   Wt Readings from Last 3 Encounters:  01/11/23 71.7 kg (158 lb)  12/28/22 71.1 kg (156 lb 12.8 oz)  12/13/22 71.7 kg (158 lb)   PHYSICAL EXAM: General:  NAD. No resp  difficulty, arrived in [facility], chronically-ill appearing, cachectic HEENT: Normal Neck: Supple. No JVD. Carotids 2+ bilat; no bruits. No lymphadenopathy or thryomegaly appreciated. Cor: PMI nondisplaced. Regular rate & rhythm. No rubs, gallops or murmurs. Lungs: Diminished throughout Abdomen: Soft, nontender, nondistended. No hepatosplenomegaly. No bruits or masses. Good bowel sounds. Extremities: No cyanosis, clubbing, rash, edema Neuro: Alert & oriented x 3, cranial nerves grossly intact. Moves all 4 extremities w/o difficulty. Affect pleasant.  ECG (personally reviewed): NSR 79 bpm  ReDs: 30%  ASSESSMENT & PLAN: 1. 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  - Continue Eliquis 5 bid - VQ 5/24: Multiple peripheral perfusion defects in both lungs, indeterminate for recurrent acute pulmonary embolism -> CT scan recommended - CT chest 5/24: Severe COPD. The component of thrombus in the right main PA has essentially resolved since the prior CTA with a small amount of anterior chronic mural thrombus remaining. Distally, occlusive chronic thrombus remains in the interlobar PA extending into RLL and RML pulmonary arteries - No role for thrombectomy - Discussed taking sildenafil 20 mg tid, has only been taking bid. - Needs PFTs with DLCO. Will see if we can schedule these before his Pulm follow up next month. - ? Need for RHC in near future.   2. Chronic Hypoxic Respiratory Failure - COPD at b/l + submassive PE - Continue oxygen 3 L [place] > Sats stable.  - Follows with Dr. Birdena Jubilee in Pulmonary. PFTs pending - Referred Pulmonary rehab  3. PAF  - NSR on ECG today. - Continue amio 100 mg daily.  Not ideal with COPD.  - Continue Eliquis 5 mg bid. - Amio labs ok (07/2022) - CBC today.   4. Chronic Biventricular HFrEF  - Echo (2/24): EF down 20-25% with D shaped  septum RV Failure, EF previously 40-45%.  - R/LHC (2/24): c/w severe NICM. Minimal CAD, moderate to severe PAH with evidence of cor pulmonale - POCUS (06/21/22): EF 40%, RV remains dilated and reduced.  - Echo today 01/15/23, results pending. - NYHA III. Volume status ok. Reds 30%.  - Continue Farxiga 10 mg daily. - Continue losartan 12.5 mg at bedtime.  - Continue digoxin 0.125 mg daily. - Off spiro due to elevated K.  - Continue with paramedicine. Appreciate their assistance   - Continue to follow with [facility]  - Labs today.  5. Tobacco abuse - Smoking 5 cigarettes/day.  - Discussed cessation   6. Prostate Cancer - Treated with seeds   7. LE claudication - PCP following - Needs ABIs, these have been ordered.  Follow up in 4 months with Dr. Gala Romney.  Anderson Malta Islam Eichinger FNP-BC 2:50 PM

## 2023-01-15 NOTE — Progress Notes (Signed)
   ReDS Vest / Clip - 01/15/23 1500       ReDS Vest / Clip   Station Marker C    Ruler Value 27    ReDS Value Range Low volume    ReDS Actual Value 30

## 2023-01-15 NOTE — Patient Instructions (Signed)
Labs done today. We will contact you only if your labs are abnormal.  TAKE YOUR SILDENAFIL BY MOUTH 3 TIMES A DAY.   No other medication changes were made. Please continue all current medications as prescribed.  Your physician recommends that you schedule a follow-up appointment in: 4 months with Dr. Gala Romney. Please contact our office in January 2025 to schedule a March 2025 appointment.   If you have any questions or concerns before your next appointment please send [place] a message through [place] or call our office at [phone].    TO LEAVE A MESSAGE FOR THE NURSE SELECT OPTION 2, PLEASE LEAVE A MESSAGE INCLUDING: YOUR NAME DATE OF BIRTH CALL BACK NUMBER REASON FOR CALL**this is important as we prioritize the call backs  YOU WILL RECEIVE A CALL BACK THE SAME DAY AS LONG AS YOU CALL BEFORE 4:00 PM   Do the following things EVERYDAY: Weigh yourself in the morning before breakfast. Write it down and keep it in a log. Take your medicines as prescribed Eat low salt foods--Limit salt (sodium) to 2000 mg per day.  Stay as active as you can everyday Limit all fluids for the day to less than 2 liters   At the [facility], you and your health needs are our priority. As part of our continuing mission to provide you with exceptional heart care, we have created designated Provider Care Teams. These Care Teams include your primary Cardiologist (physician) and Advanced Practice Providers (APPs- Physician Assistants and Nurse Practitioners) who all work together to provide you with the care you need, when you need it.   You may see any of the following providers on your designated Care Team at your next follow up: Dr Arvilla Meres Dr Marca Ancona Dr. Marcos Eke, NP Robbie Lis, [place] [facility] [place], [place] Brynda Peon, NP Karle Plumber, PharmD   Please be sure to bring in all your medications bottles to every appointment.    Thank you  for choosing [facility]

## 2023-01-16 LAB — ECHOCARDIOGRAM COMPLETE
S' Lateral: 3.9 cm
Single Plane A4C EF: 47.4 %

## 2023-01-18 ENCOUNTER — Telehealth: Payer: Self-pay | Admitting: Student in an Organized Health Care Education/Training Program

## 2023-01-18 NOTE — Telephone Encounter (Signed)
Error

## 2023-01-24 ENCOUNTER — Telehealth (HOSPITAL_COMMUNITY): Payer: Self-pay | Admitting: Emergency Medicine

## 2023-01-24 ENCOUNTER — Other Ambulatory Visit (HOSPITAL_COMMUNITY): Payer: Self-pay | Admitting: Emergency Medicine

## 2023-01-24 NOTE — Progress Notes (Signed)
Paramedicine Encounter    Patient ID: Bruce [name], male    DOB: Aug 31, 1945, 77 y.o.   MRN: [id number]   Complaints - reports shortness of breath  Assessment - Lung sound decreased - hasn't used inhaler yet today, no edema noted   Compliance with meds - improving: missed 3 morning, 5 noon, and 2 evening doses.   Pill box filled - for 2x weeks  Refills needed - none  Meds changes since last visit - Pravastatin   Social changes - new phone    VISIT SUMMARY**  I met with Bruce [name] in his home. Pt reported some concerning domestic issues with his land lord and reported that he will be moving out at the end of the week. Pt reports planning on house hunting after home visit. Pt was not interested in CSW list of homes in his price range as he said they were too close to [place]. Pt did get a functioning phone and number updated in EPIC. Pt advised to keep me posted on his living situations. Pt was agreeable. Pt's pill box was filled for 2x weeks. Upcoming appointments were reviewed. Will follow up in 2x weeks.   BP (!) 142/80   Pulse 77   Wt 158 lb 8 oz (71.9 kg)   SpO2 96%   BMI 20.91 kg/m  Weight yesterday-DNW Last visit weight-158 lbs  [name] EMT-P Community Paramedic  [phone]     ACTION: Home visit completed     Patient Care Team: Miki Kins, FNP as PCP - General (Family Medicine) Laurier Nancy, MD as PCP - Cardiology (Cardiology) Otho Ket, RN as [facility] Care Management  Patient Active Problem List   Diagnosis Date Noted   Intermittent claudication (HCC) 12/18/2022   B12 deficiency 12/18/2022   MDD (major depressive disorder), recurrent episode, moderate (HCC) 09/01/2022   Pulmonary hypertension, unspecified (HCC) 04/14/2022   Chronic systolic heart failure (HCC) 04/13/2022   Pulmonary embolism (HCC) 04/09/2022   PAF (paroxysmal atrial fibrillation) (HCC)    NSTEMI (non-ST elevated myocardial infarction) (HCC)  10/10/2018   Syncope 04/28/2018   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   Limb ischemia 11/18/2013   Acute blood loss anemia 10/08/2013   Anxiety disorder 10/08/2013   Dyslipidemia 10/08/2013   GERD (gastroesophageal reflux disease) 10/08/2013   Anticoagulated 10/08/2013   Hypertension    CAD (coronary artery disease) 08/16/2010   COPD (chronic obstructive pulmonary disease) (HCC) 08/16/2010   DVT of leg (deep venous thrombosis) (HCC) 08/16/2010    Current Outpatient Medications:    albuterol (VENTOLIN HFA) 108 (90 Base) MCG/ACT inhaler, TAKE TWO PUFFS BY MOUTH AS NEEDED FOR WHEEZING EVERY 4-6 HOURS, Disp: 8.5 g, Rfl: 5   ALPRAZolam (XANAX) 0.25 MG tablet, TAKE ONE TABLET BY MOUTH ONCE A DAY AS NEEDED FOR ANXIETY, Disp: 30 tablet, Rfl: 2   amiodarone (PACERONE) 200 MG tablet, Take 0.5 tablets (100 mg total) by mouth daily., Disp: 15 tablet, Rfl: 11   apixaban (ELIQUIS) 5 MG TABS tablet, TAKE ONE TABLET (5 MG TOTAL) BY MOUTH TWO (TWO) TIMES DAILY., Disp: 60 tablet, Rfl: 11   baclofen (LIORESAL) 10 MG tablet, TAKE ONE TABLET (10 MG TOTAL) BY MOUTH AT BEDTIME AS NEEDED FOR MUSCLE SPASMS., Disp: 30 tablet, Rfl: 0   cetirizine (ZYRTEC) 10 MG tablet, Take 1 tablet (10 mg total) by mouth daily., Disp: 30 tablet, Rfl: 11   cholecalciferol (VITAMIN D3) 25 MCG (1000 UNIT) tablet, Take 1,000 Units by mouth daily., Disp: , Rfl:  digoxin (LANOXIN) 0.125 MG tablet, TAKE ONE TABLET (0.125 MG TOTAL) BY MOUTH DAILY., Disp: 30 tablet, Rfl: 6   FARXIGA 10 MG TABS tablet, TAKE ONE TABLET (10 MG TOTAL) BY MOUTH DAILY., Disp: 90 tablet, Rfl: 1   fluticasone (FLONASE) 50 MCG/ACT nasal spray, BENDING HEAD FORWARD SPRAY ONE SPRAY INTO EACH NOSTRIL TWICE DAILY (Patient taking differently: as needed.), Disp: 16 mL, Rfl: 5   lidocaine (LIDODERM) 5 %, Place 1 patch onto the skin daily. Remove & Discard patch within 12 hours or as directed by MD, Disp: 30 patch, Rfl: 11   losartan (COZAAR) 25 MG tablet, TAKE 0.5 TABLETS (12.5  MG TOTAL) BY MOUTH AT BEDTIME., Disp: 50 tablet, Rfl: 3   Multiple Vitamin (MULTIVITAMIN) tablet, Take 1 tablet by mouth daily. For Men, Disp: , Rfl:    nitroGLYCERIN (NITROSTAT) 0.4 MG SL tablet, Place 0.4 mg under the tongue every 5 (five) minutes as needed for chest pain., Disp: , Rfl:    Omega-3 Fatty Acids (FISH OIL PO), Take 1,000 mg by mouth every evening., Disp: , Rfl:    pantoprazole (PROTONIX) 40 MG tablet, Take 1 tablet (40 mg total) by mouth daily., Disp: 90 tablet, Rfl: 3   Potassium Chloride (KLOR-CON PO), Take 40 mEq by mouth daily., Disp: , Rfl:    ranolazine (RANEXA) 500 MG 12 hr tablet, TAKE ONE TABLET (500 MG TOTAL) BY MOUTH TWO (TWO) TIMES DAILY., Disp: 60 tablet, Rfl: 6   rOPINIRole (REQUIP) 1 MG tablet, TAKE ONE TABLET (1 MG TOTAL) BY MOUTH IN THE MORNING AND AT BEDTIME., Disp: 60 tablet, Rfl: 3   rosuvastatin (CRESTOR) 10 MG tablet, TAKE ONE TABLET (10 MG TOTAL) BY MOUTH DAILY., Disp: 100 tablet, Rfl: 3   sildenafil (REVATIO) 20 MG tablet, Take 1 tablet (20 mg total) by mouth 3 (three) times daily., Disp: 90 tablet, Rfl: 5   Tiotropium Bromide-Olodaterol (STIOLTO RESPIMAT) 2.5-2.5 MCG/ACT AERS, Inhale 2 puffs into the lungs daily., Disp: 60 each, Rfl: 12 Allergies  Allergen Reactions   Adhesive [Tape] Other (See Comments)    After right leg fracture surgery, pt developed a large blister where tape was applied to his right leg. OK to use paper tape.   Eggs-Apples-Oats [Alitraq] Other (See Comments)    Caused chest pains   Imdur [Isosorbide Dinitrate] Other (See Comments)    hallucinations   Singulair [Montelukast Sodium] Other (See Comments)    Hallucinations      Social History   Socioeconomic History   Marital status: Single    Spouse name: Not on file   Number of children: 2   Years of education: Not on file   Highest education level: 6th grade  Occupational History   Not on file  Tobacco Use   Smoking status: Former    Current packs/day: 0.00     Average packs/day: 0.5 packs/day for 52.0 years (26.0 ttl pk-yrs)    Types: Cigarettes    Start date: 07/05/1970    Quit date: 07/05/2022    Years since quitting: 0.5   Smokeless tobacco: Never  Vaping Use   Vaping status: Never Used  Substance and Sexual Activity   Alcohol use: No    Alcohol/week: 0.0 standard drinks of alcohol    Comment: Stopped 2009   Drug use: No   Sexual activity: Not Currently  Other Topics Concern   Not on file  Social History Narrative   ** Merged History Encounter **       Social Determinants of Health  Financial Resource Strain: High Risk (04/12/2022)   Overall Financial Resource Strain (CARDIA)    Difficulty of Paying Living Expenses: Hard  Food Insecurity: Food Insecurity Present (08/03/2022)   Hunger Vital Sign    Worried About Running Out of Food in the Last Year: Sometimes true    Ran Out of Food in the Last Year: Sometimes true  Transportation Needs: No Transportation Needs (05/23/2022)   PRAPARE - Administrator, Civil Service (Medical): No    Lack of Transportation (Non-Medical): No  Physical Activity: Inactive (02/05/2017)   Exercise Vital Sign    Days of Exercise per Week: 0 days    Minutes of Exercise per Session: 0 min  Stress: Stress Concern Present (02/05/2017)   [employer] of Occupational Health - Occupational Stress Questionnaire    Feeling of Stress : Rather much  Social Connections: Moderately Isolated (02/05/2017)   Social Connection and Isolation Panel [NHANES]    Frequency of Communication with Friends and Family: More than three times a week    Frequency of Social Gatherings with Friends and Family: Once a week    Attends Religious Services: Never    Database administrator or Organizations: No    Attends Banker Meetings: Never    Marital Status: Divorced  Catering manager Violence: Not At Risk (04/14/2022)   Humiliation, Afraid, Rape, and Kick questionnaire    Fear of Current or Ex-Partner: No     Emotionally Abused: No    Physically Abused: No    Sexually Abused: No    Physical Exam      Future Appointments  Date Time Provider Department Center  02/05/2023  9:00 AM Miki Kins, FNP [facility] None  02/08/2023 10:00 AM Otho Ket, RN [facility] None  02/19/2023 10:15 AM Raechel Chute, MD [facility] None

## 2023-01-24 NOTE — Telephone Encounter (Signed)
I contacted [facility] today to schedule Bruce [name]'s Pulmonary function test. Scheduler advised that they do not have the December schedule available and will not know when it will be available. She advised to continue to call back.   Will continue to follow up on same.   [name] EMT-P Community Paramedic  [phone]

## 2023-02-02 DIAGNOSIS — J42 Unspecified chronic bronchitis: Secondary | ICD-10-CM | POA: Diagnosis not present

## 2023-02-02 DIAGNOSIS — J9601 Acute respiratory failure with hypoxia: Secondary | ICD-10-CM | POA: Diagnosis not present

## 2023-02-02 DIAGNOSIS — J449 Chronic obstructive pulmonary disease, unspecified: Secondary | ICD-10-CM | POA: Diagnosis not present

## 2023-02-05 ENCOUNTER — Encounter: Payer: Self-pay | Admitting: Family

## 2023-02-05 ENCOUNTER — Ambulatory Visit (INDEPENDENT_AMBULATORY_CARE_PROVIDER_SITE_OTHER): Payer: 59 | Admitting: Family

## 2023-02-05 VITALS — BP 140/78 | HR 79 | Ht 73.0 in | Wt 151.8 lb

## 2023-02-05 DIAGNOSIS — R5383 Other fatigue: Secondary | ICD-10-CM

## 2023-02-05 DIAGNOSIS — I10 Essential (primary) hypertension: Secondary | ICD-10-CM

## 2023-02-05 DIAGNOSIS — E559 Vitamin D deficiency, unspecified: Secondary | ICD-10-CM | POA: Diagnosis not present

## 2023-02-05 DIAGNOSIS — E785 Hyperlipidemia, unspecified: Secondary | ICD-10-CM

## 2023-02-05 DIAGNOSIS — R7303 Prediabetes: Secondary | ICD-10-CM | POA: Diagnosis not present

## 2023-02-05 DIAGNOSIS — Z1159 Encounter for screening for other viral diseases: Secondary | ICD-10-CM

## 2023-02-05 DIAGNOSIS — J42 Unspecified chronic bronchitis: Secondary | ICD-10-CM | POA: Diagnosis not present

## 2023-02-05 DIAGNOSIS — E538 Deficiency of other specified B group vitamins: Secondary | ICD-10-CM | POA: Diagnosis not present

## 2023-02-05 NOTE — Assessment & Plan Note (Addendum)
Patient is seen by Pulmonology, who manage this condition.  He is well controlled with current therapy.   Will defer to them for further changes to plan of care.

## 2023-02-05 NOTE — Assessment & Plan Note (Signed)
Blood pressure well controlled with current medications.  Continue current therapy.  Will reassess at follow up.  

## 2023-02-05 NOTE — Assessment & Plan Note (Addendum)
Checking labs today.  Will continue supplements as needed.  

## 2023-02-05 NOTE — Progress Notes (Signed)
Established Patient Office Visit  Subjective:  Patient ID: Bruce [name], male    DOB: 1945-10-24  Age: 77 y.o. MRN: [id number]  Chief Complaint  Patient presents with   Follow-up    3 month follow up    Patient is here today for his 3 months follow up.  He has been feeling fairly well since last appointment.   He does not have additional concerns to discuss today.  Labs are due today. He needs refills.   I have reviewed his active problem list, medication list, allergies, health maintenance, notes from last encounter, lab results for his appointment today.   No other concerns at this time.   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 01/01/2015   Complication of anesthesia    wake up with a head ache   COPD (chronic obstructive pulmonary disease) (HCC)    Coronary artery disease    Edema 08/16/2010   GERD (gastroesophageal reflux disease)    Headache    related to sinus congestion   History of blood transfusion    History of kidney stones    History of MI (myocardial infarction) 10/08/2013   Hypertension    Hypokalemia 04/09/2022   Intracranial bleed (HCC) 12/31/2018   Lactic acidosis 04/09/2022   Leg cramps 08/16/2010   Malignant neoplasm of prostate (HCC) 07/13/2016   Multiple closed facial bone fractures (HCC)    Myocardial infarction [facility])    '09 AND '12   Peripheral vascular disease (HCC)    Shortness of breath    With exertion .   Tibia/fibula fracture 10/06/2013   Tibial plateau fracture 12/29/2014   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HCC)  10/08/2013   UTI (urinary tract infection)    frequent UTI   Visual hallucination 03/18/2015    Past Surgical History:  Procedure Laterality Date   CARDIAC CATHETERIZATION     CHOLECYSTECTOMY     EXTERNAL FIXATION LEG Right 10/06/2013   Procedure: CLOSED REDUCTION RIGHT TIBIAL PLATEAU FRACTURE, EXTERNAL FIXATION RIGHT LEG, PLACEMENT OF WOUND VAC;  Surgeon: Budd Palmer, MD;  [facility];  Service: Orthopedics;  Laterality: Right;   FEMORAL-POPLITEAL BYPASS GRAFT Right 10/06/2013   Procedure: RIGHT POPLITEAL-POPLITEAL ARTERY BYPASS GRAFT;  Surgeon: Larina Earthly, MD;  Location: [facility] OR;  Service: Vascular;  Laterality: Right;   FRACTURE SURGERY Right 2014   HARDWARE REMOVAL Right 12/02/2013   Procedure: REMOVAL EXTERNAL FIXATION RIGHT LEG ;  Surgeon: Budd Palmer, MD;  Location: [facility] OR;  Service: Orthopedics;  Laterality: Right;   HERNIA REPAIR Right 1990's   I & D EXTREMITY Right 10/09/2013   Procedure: IRRIGATION AND DEBRIDEMENT RIGHT LEG, CLOSURE  OF WOUNDS, PLACEMENT OF WOUND VAC ON EACH SIDE OF LEG;  Surgeon: Budd Palmer, MD;  Location: [facility] OR;  Service: Orthopedics;  Laterality: Right;   IR ANGIOGRAM PULMONARY RIGHT SELECTIVE  04/10/2022   IR ANGIOGRAM SELECTIVE EACH ADDITIONAL VESSEL  04/10/2022   IR NEPHROSTOMY PLACEMENT LEFT  06/19/2016   IR THROMBECT PRIM  MECH INIT (INCLU) MOD SED  04/10/2022   IR US GUIDE VASC ACCESS RIGHT  04/10/2022   IVC filter  2009   placed @ [facility]/ Removed in 2010.   IVC Filter Removed     LEFT HEART CATH AND CORONARY ANGIOGRAPHY Right 10/11/2018   Procedure: LEFT HEART CATH AND CORONARY ANGIOGRAPHY;  Surgeon: Laurier Nancy, MD;  Location: [facility] INVASIVE CV LAB;  Service: Cardiovascular;  Laterality: Right;   ligament leg Left    NEPHROLITHOTOMY Left 06/19/2016   Procedure: NEPHROLITHOTOMY PERCUTANEOUS;  Surgeon: Vanna Scotland, MD;  Location: [facility] ORS;  Service: Urology;  Laterality: Left;   ORIF TIBIA FRACTURE Right 12/29/2014   Procedure: OPEN REDUCTION  INTERNAL FIXATION (ORIF) RIGHT TIBIA FRACTURE, RIA VS ICBG;  Surgeon: Myrene Galas, MD;  Location: [facility] OR;  Service: Orthopedics;  Laterality: Right;   RADIOACTIVE SEED IMPLANT N/A 09/12/2016   Procedure: RADIOACTIVE SEED IMPLANT/BRACHYTHERAPY IMPLANT;  Surgeon: Vanna Scotland, MD;  Location: [facility] ORS;  Service: Urology;  Laterality: N/A;   RIGHT/LEFT HEART CATH AND CORONARY ANGIOGRAPHY N/A 04/13/2022   Procedure: RIGHT/LEFT HEART CATH AND CORONARY ANGIOGRAPHY;  Surgeon: Dolores Patty, MD;  [facility];  Service: Cardiovascular;  Laterality: N/A;    Social History   Socioeconomic History   Marital status: Single    Spouse name: Not on file   Number of children: 2   Years of education: Not on file   Highest education level: 6th grade  Occupational History   Not on file  Tobacco Use   Smoking status: Former    Current packs/day: 0.00    Average packs/day: 0.5 packs/day for 52.0 years (26.0 ttl pk-yrs)    Types: Cigarettes    Start date: 07/05/1970    Quit date: 07/05/2022    Years since quitting: 0.5   Smokeless tobacco: Never  Vaping Use   Vaping status: Never Used  Substance and Sexual Activity   Alcohol use: No    Alcohol/week: 0.0 standard drinks of alcohol    Comment: Stopped 2009   Drug use: No   Sexual activity: Not Currently  Other Topics Concern   Not on file  Social History Narrative   ** Merged History Encounter **       Social Determinants of Health   Financial Resource Strain: High Risk (04/12/2022)   Overall Financial Resource Strain (CARDIA)    Difficulty of Paying Living Expenses: Hard  Food Insecurity: Food Insecurity Present (08/03/2022)   Hunger Vital Sign    Worried About Running Out of Food in the Last Year: Sometimes true    Ran Out of Food in the Last Year: Sometimes true  Transportation Needs: No Transportation Needs (05/23/2022)   PRAPARE - Administrator, Civil Service (Medical): No    Lack of Transportation  (Non-Medical): No  Physical Activity: Inactive (02/05/2017)   Exercise Vital Sign    Days of Exercise per Week: 0 days    Minutes of Exercise per Session: 0 min  Stress: Stress Concern Present (02/05/2017)   [employer] of Occupational Health - Occupational Stress Questionnaire    Feeling of Stress : Rather much  Social Connections: Moderately Isolated (02/05/2017)   Social Connection and Isolation Panel [NHANES]    Frequency of Communication with Friends and Family: More than three times a week    Frequency of Social Gatherings with Friends and Family: Once a week    Attends Religious Services: Never    Database administrator or Organizations:  No    Attends Club or Organization Meetings: Never    Marital Status: Divorced  Catering manager Violence: Not At Risk (04/14/2022)   Humiliation, Afraid, Rape, and Kick questionnaire    Fear of Current or Ex-Partner: No    Emotionally Abused: No    Physically Abused: No    Sexually Abused: No    Family History  Problem Relation Age of Onset   Hypertension Mother    Prostate cancer Brother    Mental illness Neg Hx     Allergies  Allergen Reactions   Adhesive [Tape] Other (See Comments)    After right leg fracture surgery, pt developed a large blister where tape was applied to his right leg. OK to use paper tape.   Eggs-Apples-Oats [Alitraq] Other (See Comments)    Caused chest pains   Imdur [Isosorbide Dinitrate] Other (See Comments)    hallucinations   Singulair [Montelukast Sodium] Other (See Comments)    Hallucinations     Review of Systems  Respiratory:  Positive for shortness of breath.   Neurological:  Positive for dizziness (when standing up, mostly first thing in the morning).  All other systems reviewed and are negative.      Objective:   BP (!) 140/78   Pulse 79   Ht 6\' 1"  (1.854 m)   Wt 151 lb 12.8 oz (68.9 kg)   SpO2 (!) 73% Comment: 3L  BMI 20.03 kg/m   Vitals:   02/05/23 0858  BP: (!) 140/78   Pulse: 79  Height: 6\' 1"  (1.854 m)  Weight: 151 lb 12.8 oz (68.9 kg)  SpO2: (!) 73% Comment: 3L  BMI (Calculated): 20.03    Physical Exam Vitals and nursing note reviewed.  Constitutional:      General: He is not in acute distress.    Appearance: Normal appearance. He is well-developed, well-groomed and normal weight. He is ill-appearing.  HENT:     Head: Normocephalic and atraumatic.     Nose: Nose normal.     Mouth/Throat:     Mouth: Mucous membranes are moist.  Eyes:     Extraocular Movements: Extraocular movements intact.     Conjunctiva/sclera: Conjunctivae normal.     Pupils: Pupils are equal, round, and reactive to light.  Cardiovascular:     Rate and Rhythm: Normal rate and regular rhythm.     Pulses: Normal pulses.     Heart sounds: Normal heart sounds.  Pulmonary:     Effort: Pulmonary effort is normal.     Breath sounds: Wheezing present.  Musculoskeletal:        General: Normal range of motion.     Cervical back: Normal range of motion.  Neurological:     General: No focal deficit present.     Mental Status: He is alert and oriented to person, place, and time. Mental status is at baseline.  Psychiatric:        Mood and Affect: Mood normal.        Behavior: Behavior is uncooperative.        Thought Content: Thought content normal.        Judgment: Judgment normal.      No results found for any visits on 02/05/23.  Recent Results (from the past 2160 hour(s))  ECHOCARDIOGRAM COMPLETE     Status: None   Collection Time: 01/15/23  3:00 PM  Result Value Ref Range   S' Lateral 3.90 cm   Single Plane A4C EF 47.4 %   Est EF  40 - 45%   Basic metabolic panel     Status: None   Collection Time: 01/15/23  3:38 PM  Result Value Ref Range   Sodium 141 135 - 145 mmol/L   Potassium 4.7 3.5 - 5.1 mmol/L   Chloride 105 98 - 111 mmol/L   CO2 27 22 - 32 mmol/L   Glucose, Bld 89 70 - 99 mg/dL    Comment: Glucose reference range applies only to samples taken after  fasting for at least 8 hours.   BUN 9 8 - 23 mg/dL   Creatinine, Ser 1.61 0.61 - 1.24 mg/dL   Calcium 9.7 8.9 - 09.6 mg/dL   GFR, Estimated >04 >54 mL/min    Comment: (NOTE) Calculated using the CKD-EPI Creatinine Equation (2021)    Anion gap 9 5 - 15    Comment: Performed at [facility], [address], [place], [place] [address]  B Nat Peptide     Status: Abnormal   Collection Time: 01/15/23  3:38 PM  Result Value Ref Range   B Natriuretic Peptide 444.3 (H) 0.0 - 100.0 pg/mL    Comment: Performed at [facility], [address], [place], [place] [address]  Digoxin level     Status: Abnormal   Collection Time: 01/15/23  3:38 PM  Result Value Ref Range   Digoxin Level <0.2 (L) 0.8 - 2.0 ng/mL    Comment: RESULT CONFIRMED BY MANUAL DILUTION Performed at [facility], [address], [place], [place] [address]   CBC     Status: None   Collection Time: 01/15/23  3:38 PM  Result Value Ref Range   WBC 6.3 4.0 - 10.5 K/uL   RBC 4.34 4.22 - 5.81 MIL/uL   Hemoglobin 13.5 13.0 - 17.0 g/dL   HCT 13.0 86.5 - 78.4 %   MCV 95.6 80.0 - 100.0 fL   MCH 31.1 26.0 - 34.0 pg   MCHC 32.5 30.0 - 36.0 g/dL   RDW 69.6 29.5 - 28.4 %   Platelets 249 150 - 400 K/uL   nRBC 0.0 0.0 - 0.2 %    Comment: Performed at [facility], [address] [place], [place] [address]       Assessment & Plan:   Problem List Items Addressed This Visit       Active Problems   COPD (chronic obstructive pulmonary disease) (HCC) - Primary    Patient is seen by Pulmonology, who manage this condition.  He is well controlled with current therapy.   Will defer to them for further changes to plan of care.      Relevant Orders   CMP14+EGFR   CBC with Diff   Hypertension (Chronic)    Blood pressure well controlled with current medications.  Continue current therapy.  Will reassess at follow up.       Relevant Orders   CMP14+EGFR   CBC with Diff   Dyslipidemia (Chronic)    Checking  labs today.  Continue current therapy for lipid control. Will modify as needed based on labwork results.       Relevant Orders   Lipid panel   CMP14+EGFR   CBC with Diff   B12 deficiency    Checking labs today.  Will continue supplements as needed.       Relevant Orders   CMP14+EGFR   Vitamin B12   CBC with Diff   Other Visit Diagnoses     Vitamin D deficiency, unspecified  Checking labs today.  Will continue supplements as needed.   Relevant Orders   VITAMIN D 25 Hydroxy (Vit-D Deficiency, Fractures)   CMP14+EGFR   CBC with Diff   Need for hepatitis C screening test       Test ordered in office today. Will call with results.   Relevant Orders   CMP14+EGFR   CBC with Diff   Hepatitis C Ab reflex to Quant PCR   Other fatigue       Relevant Orders   CMP14+EGFR   TSH   CBC with Diff   Prediabetes       A1C is in prediabetic ranges. Patient counseled on dietary choices and verbalized understanding. Will reassess at follow up after next lab check.   Relevant Orders   CMP14+EGFR   Hemoglobin A1c   CBC with Diff       Return in about 2 weeks (around 02/19/2023) for AWV.   Total time spent: 20 minutes  Miki Kins, FNP  02/05/2023   This document may have been prepared by [facility] Voice Recognition software and as such may include unintentional dictation errors.

## 2023-02-05 NOTE — Assessment & Plan Note (Addendum)
Checking labs today.  Continue current therapy for lipid control. Will modify as needed based on labwork results.  

## 2023-02-06 ENCOUNTER — Other Ambulatory Visit: Payer: Self-pay | Admitting: Family

## 2023-02-06 LAB — CBC WITH DIFFERENTIAL/PLATELET
Basophils Absolute: 0 10*3/uL (ref 0.0–0.2)
Basos: 1 %
EOS (ABSOLUTE): 0.1 10*3/uL (ref 0.0–0.4)
Eos: 2 %
Hematocrit: 45.7 % (ref 37.5–51.0)
Hemoglobin: 14.8 g/dL (ref 13.0–17.7)
Immature Grans (Abs): 0 10*3/uL (ref 0.0–0.1)
Immature Granulocytes: 1 %
Lymphocytes Absolute: 1 10*3/uL (ref 0.7–3.1)
Lymphs: 15 %
MCH: 31.8 pg (ref 26.6–33.0)
MCHC: 32.4 g/dL (ref 31.5–35.7)
MCV: 98 fL — ABNORMAL HIGH (ref 79–97)
Monocytes Absolute: 0.3 10*3/uL (ref 0.1–0.9)
Monocytes: 4 %
Neutrophils Absolute: 5 10*3/uL (ref 1.4–7.0)
Neutrophils: 77 %
Platelets: 284 10*3/uL (ref 150–450)
RBC: 4.66 x10E6/uL (ref 4.14–5.80)
RDW: 13.1 % (ref 11.6–15.4)
WBC: 6.4 10*3/uL (ref 3.4–10.8)

## 2023-02-06 LAB — VITAMIN D 25 HYDROXY (VIT D DEFICIENCY, FRACTURES): Vit D, 25-Hydroxy: 45.1 ng/mL (ref 30.0–100.0)

## 2023-02-06 LAB — CMP14+EGFR
ALT: 19 [IU]/L (ref 0–44)
AST: 20 [IU]/L (ref 0–40)
Albumin: 4.3 g/dL (ref 3.8–4.8)
Alkaline Phosphatase: 150 [IU]/L — ABNORMAL HIGH (ref 44–121)
BUN/Creatinine Ratio: 10 (ref 10–24)
BUN: 12 mg/dL (ref 8–27)
Bilirubin Total: 0.8 mg/dL (ref 0.0–1.2)
CO2: 20 mmol/L (ref 20–29)
Calcium: 10.5 mg/dL — ABNORMAL HIGH (ref 8.6–10.2)
Chloride: 101 mmol/L (ref 96–106)
Creatinine, Ser: 1.15 mg/dL (ref 0.76–1.27)
Globulin, Total: 3 g/dL (ref 1.5–4.5)
Glucose: 87 mg/dL (ref 70–99)
Potassium: 4.7 mmol/L (ref 3.5–5.2)
Sodium: 141 mmol/L (ref 134–144)
Total Protein: 7.3 g/dL (ref 6.0–8.5)
eGFR: 66 mL/min/{1.73_m2} (ref >59)

## 2023-02-06 LAB — HCV INTERPRETATION

## 2023-02-06 LAB — HEMOGLOBIN A1C
Est. average glucose Bld gHb Est-mCnc: 114 mg/dL
Hgb A1c MFr Bld: 5.6 % (ref 4.8–5.6)

## 2023-02-06 LAB — LIPID PANEL
Chol/HDL Ratio: 2.6 {ratio} (ref 0.0–5.0)
Cholesterol, Total: 166 mg/dL (ref 100–199)
HDL: 65 mg/dL (ref >39)
LDL Chol Calc (NIH): 88 mg/dL (ref 0–99)
Triglycerides: 67 mg/dL (ref 0–149)
VLDL Cholesterol Cal: 13 mg/dL (ref 5–40)

## 2023-02-06 LAB — VITAMIN B12: Vitamin B-12: 495 pg/mL (ref 232–1245)

## 2023-02-06 LAB — HCV AB W REFLEX TO QUANT PCR: HCV Ab: NONREACTIVE

## 2023-02-06 LAB — TSH: TSH: 6.2 u[IU]/mL — ABNORMAL HIGH (ref 0.450–4.500)

## 2023-02-07 ENCOUNTER — Telehealth (HOSPITAL_COMMUNITY): Payer: Self-pay | Admitting: Emergency Medicine

## 2023-02-07 NOTE — Telephone Encounter (Signed)
I called Bruce [name] to set up our home visit, pt advised that he had a week worth of medication left despite filling 2 weeks worth of medication 2x weeks ago. Pt advised that he would wait until next week and scheduled visit for:  9AM on Tuesday, December

## 2023-02-07 NOTE — Telephone Encounter (Signed)
December 10th at 9 am. **   Pt also advised address change to:   [address]  [address] [place], [place] [address]  Updated address in epic.   [name] EMT-P Community Paramedic  [phone]

## 2023-02-08 ENCOUNTER — Ambulatory Visit: Payer: Self-pay

## 2023-02-08 ENCOUNTER — Other Ambulatory Visit (HOSPITAL_COMMUNITY): Payer: Self-pay | Admitting: Emergency Medicine

## 2023-02-08 NOTE — Progress Notes (Signed)
Paramedicine Encounter    Patient ID: Bruce [name], male    DOB: [birth year], 77 y.o.   MRN: [id number]   Complaints - increased oxygen need (consistently using oxygen)   Assessment - lung sounds clear, no edema noted  Compliance with meds - Missed 3 days of medication   Pill box filled - for 2x weeks  Refills needed - none  Meds changes since last visit - none     Social changes - just moved. Cleaner and healthier living situation.    VISIT SUMMARY**  I met with Bruce [name] in his new home. Pt has increased in compliance of medications, still not taking noon doses. We reviewed the purpose of this medication and created a "Noon" daily dose bottle for his daily outings. Pt reported that he was called with a PFT scheduled for 12/10, pulmonary follow up scheduled for 12/16. Pill box was filled for 2x weeks. Will follow up in 2x weeks.   BP (!) 154/84   Pulse 72   Resp 16   Wt 158 lb (71.7 kg)   SpO2 98% Comment: 3L  BMI 20.85 kg/m  Weight yesterday-DNW Last visit weight-158 lbs   [name] EMT-P Community Paramedic  [phone]    ACTION: Home visit completed     Patient Care Team: Miki Kins, FNP as PCP - General (Family Medicine) Laurier Nancy, MD as PCP - Cardiology (Cardiology)  Patient Active Problem List   Diagnosis Date Noted   Intermittent claudication (HCC) 12/18/2022   B12 deficiency 12/18/2022   MDD (major depressive disorder), recurrent episode, moderate (HCC) 09/01/2022   Pulmonary hypertension, unspecified (HCC) 04/14/2022   Chronic systolic heart failure (HCC) 04/13/2022   Pulmonary embolism (HCC) 04/09/2022   PAF (paroxysmal atrial fibrillation) (HCC)    NSTEMI (non-ST elevated myocardial infarction) (HCC) 10/10/2018   Syncope 04/28/2018   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   Limb ischemia 11/18/2013   Acute blood loss anemia 10/08/2013   Anxiety disorder 10/08/2013   Dyslipidemia 10/08/2013   GERD (gastroesophageal reflux  disease) 10/08/2013   Anticoagulated 10/08/2013   Hypertension    CAD (coronary artery disease) 08/16/2010   COPD (chronic obstructive pulmonary disease) (HCC) 08/16/2010   DVT of leg (deep venous thrombosis) (HCC) 08/16/2010    Current Outpatient Medications:    albuterol (VENTOLIN HFA) 108 (90 Base) MCG/ACT inhaler, TAKE TWO PUFFS BY MOUTH AS NEEDED FOR WHEEZING EVERY 4-6 HOURS, Disp: 8.5 g, Rfl: 5   ALPRAZolam (XANAX) 0.25 MG tablet, TAKE ONE TABLET BY MOUTH ONCE A DAY AS NEEDED FOR ANXIETY, Disp: 30 tablet, Rfl: 2   amiodarone (PACERONE) 200 MG tablet, Take 0.5 tablets (100 mg total) by mouth daily., Disp: 15 tablet, Rfl: 11   apixaban (ELIQUIS) 5 MG TABS tablet, TAKE ONE TABLET (5 MG TOTAL) BY MOUTH TWO (TWO) TIMES DAILY., Disp: 60 tablet, Rfl: 11   baclofen (LIORESAL) 10 MG tablet, TAKE ONE TABLET (10 MG TOTAL) BY MOUTH AT BEDTIME AS NEEDED FOR MUSCLE SPASMS., Disp: 30 tablet, Rfl: 0   cetirizine (ZYRTEC) 10 MG tablet, Take 1 tablet (10 mg total) by mouth daily., Disp: 30 tablet, Rfl: 11   cholecalciferol (VITAMIN D3) 25 MCG (1000 UNIT) tablet, Take 1,000 Units by mouth daily., Disp: , Rfl:    digoxin (LANOXIN) 0.125 MG tablet, TAKE ONE TABLET (0.125 MG TOTAL) BY MOUTH DAILY., Disp: 30 tablet, Rfl: 6   FARXIGA 10 MG TABS tablet, TAKE ONE TABLET (10 MG TOTAL) BY MOUTH DAILY., Disp: 90 tablet, Rfl: 1  lidocaine (LIDODERM) 5 %, Place 1 patch onto the skin daily. Remove & Discard patch within 12 hours or as directed by MD, Disp: 30 patch, Rfl: 11   losartan (COZAAR) 25 MG tablet, TAKE 0.5 TABLETS (12.5 MG TOTAL) BY MOUTH AT BEDTIME., Disp: 50 tablet, Rfl: 3   Multiple Vitamin (MULTIVITAMIN) tablet, Take 1 tablet by mouth daily. For Men, Disp: , Rfl:    nitroGLYCERIN (NITROSTAT) 0.4 MG SL tablet, Place 0.4 mg under the tongue every 5 (five) minutes as needed for chest pain., Disp: , Rfl:    Omega-3 Fatty Acids (FISH OIL PO), Take 1,000 mg by mouth every evening., Disp: , Rfl:    omeprazole  (PRILOSEC) 40 MG capsule, Take 40 mg by mouth 2 (two) times daily., Disp: , Rfl:    pantoprazole (PROTONIX) 40 MG tablet, Take 1 tablet (40 mg total) by mouth daily., Disp: 90 tablet, Rfl: 3   Potassium Chloride (KLOR-CON PO), Take 40 mEq by mouth daily., Disp: , Rfl:    ranolazine (RANEXA) 500 MG 12 hr tablet, TAKE ONE TABLET (500 MG TOTAL) BY MOUTH TWO (TWO) TIMES DAILY., Disp: 60 tablet, Rfl: 6   rOPINIRole (REQUIP) 1 MG tablet, TAKE ONE TABLET (1 MG TOTAL) BY MOUTH IN THE MORNING AND AT BEDTIME., Disp: 60 tablet, Rfl: 3   rosuvastatin (CRESTOR) 10 MG tablet, TAKE ONE TABLET (10 MG TOTAL) BY MOUTH DAILY., Disp: 100 tablet, Rfl: 3   sildenafil (REVATIO) 20 MG tablet, Take 1 tablet (20 mg total) by mouth 3 (three) times daily., Disp: 90 tablet, Rfl: 5   Tiotropium Bromide-Olodaterol (STIOLTO RESPIMAT) 2.5-2.5 MCG/ACT AERS, Inhale 2 puffs into the lungs daily., Disp: 60 each, Rfl: 12 Allergies  Allergen Reactions   Adhesive [Tape] Other (See Comments)    After right leg fracture surgery, pt developed a large blister where tape was applied to his right leg. OK to use paper tape.   Eggs-Apples-Oats [Alitraq] Other (See Comments)    Caused chest pains   Imdur [Isosorbide Dinitrate] Other (See Comments)    hallucinations   Singulair [Montelukast Sodium] Other (See Comments)    Hallucinations      Social History   Socioeconomic History   Marital status: Single    Spouse name: Not on file   Number of children: 2   Years of education: Not on file   Highest education level: 6th grade  Occupational History   Not on file  Tobacco Use   Smoking status: Former    Current packs/day: 0.00    Average packs/day: 0.5 packs/day for 52.0 years (26.0 ttl pk-yrs)    Types: Cigarettes    Start date: 07/05/1970    Quit date: 07/05/2022    Years since quitting: 0.6   Smokeless tobacco: Never  Vaping Use   Vaping status: Never Used  Substance and Sexual Activity   Alcohol use: No    Alcohol/week:  0.0 standard drinks of alcohol    Comment: Stopped 2009   Drug use: No   Sexual activity: Not Currently  Other Topics Concern   Not on file  Social History Narrative   ** Merged History Encounter **       Social Determinants of Health   Financial Resource Strain: High Risk (04/12/2022)   Overall Financial Resource Strain (CARDIA)    Difficulty of Paying Living Expenses: Hard  Food Insecurity: No Food Insecurity (02/08/2023)   Hunger Vital Sign    Worried About Running Out of Food in the Last Year: Never true  Ran Out of Food in the Last Year: Never true  Transportation Needs: No Transportation Needs (05/23/2022)   PRAPARE - Administrator, Civil Service (Medical): No    Lack of Transportation (Non-Medical): No  Physical Activity: Inactive (02/05/2017)   Exercise Vital Sign    Days of Exercise per Week: 0 days    Minutes of Exercise per Session: 0 min  Stress: Stress Concern Present (02/05/2017)   [employer] of Occupational Health - Occupational Stress Questionnaire    Feeling of Stress : Rather much  Social Connections: Moderately Isolated (02/05/2017)   Social Connection and Isolation Panel [NHANES]    Frequency of Communication with Friends and Family: More than three times a week    Frequency of Social Gatherings with Friends and Family: Once a week    Attends Religious Services: Never    Database administrator or Organizations: No    Attends Banker Meetings: Never    Marital Status: Divorced  Catering manager Violence: Not At Risk (04/14/2022)   Humiliation, Afraid, Rape, and Kick questionnaire    Fear of Current or Ex-Partner: No    Emotionally Abused: No    Physically Abused: No    Sexually Abused: No    Physical Exam      Future Appointments  Date Time Provider Department Center  02/13/2023  9:00 AM AMA DG/CT [facility] None  02/13/2023  3:00 PM [facility]-P[facility] None  02/19/2023 10:15 AM Raechel Chute, MD [facility] None   02/19/2023  1:30 PM Miki Kins, FNP [facility] None

## 2023-02-08 NOTE — Patient Outreach (Signed)
Care Coordination   Follow Up Visit Note   02/08/2023 Name: Bruce [name] MRN: [id number] DOB: 06/01/1945  Bruce [name] is a 77 y.o. year old male who sees Miki Kins, FNP for primary care. I spoke with  Bruce [name] by phone today.  What matters to the patients health and wellness today?  Patient states he is doing ok.  Denies having any increase in symptoms related to COPD/ HF.  He reports wearing his oxygen at 3 L around the clock.  Patient denies having pulse oximetry to monitor oxygen level.  He states his Paramedicine nurse mentioned she was assist him in obtaining a pulse oximetry. He states she visits him every 2 weeks to manage his medications.   Patient reports having follow up with his primary care provider on 02/05/23.  Denies treatment change.  Patient states he is scheduled to have a breathing test on 02/13/23 and will have a follow up provider visit on 02/19/23 to go over results.  Patient states he is currently looking for another place to live.  RNCM offered referral to social worker to housing assistance.  Patient declined stating he has some people assisting him with this. Patient reports overall he is managing his health conditions and is aware when to report symptoms/ concerns to provider.  Patient denies any further needs or concerns at this time and is agreeable that care coordination goals have been met.    Goals Addressed             This Visit's Progress    COMPLETED: Post hospital follow up / management of health conditions       Interventions Today    Flowsheet Row Most Recent Value  Chronic Disease   Chronic disease during today's visit Chronic Obstructive Pulmonary Disease (COPD), Congestive Heart Failure (CHF)  General Interventions   General Interventions Discussed/Reviewed General Interventions Reviewed, Doctor Visits  [evaluation of current treatment plan for COPD/ HF and patients adherence to plan as established by provider. Assessed for COPD/HF  symptoms.]  Doctor Visits Discussed/Reviewed Doctor Visits Reviewed  Annabell Sabal upcoming provider visits. Advised to keep follow up appointments with providers as recommended. Advised to contact provider if care coordination services needed in the future.]  Education Interventions   Education Provided Provided Education  [Reviewed signs/ symptoms of COPD/ HF.  Advised to be aware of increase symptoms and report to provider as soon as noticed.  Advised to call 911 for severe symptoms.]  Provided Verbal Education On Other  [discussed importance of having a pulse oximetry to measure oxygen level.  Researched pulse oximetry's and cost on [employer] and provided patient information.]  Nutrition Interventions   Nutrition Discussed/Reviewed Decreasing salt, Nutrition Reviewed  Pharmacy Interventions   Pharmacy Dicussed/Reviewed Pharmacy Topics Reviewed  [reviewed medications. Advised to take medications as prescribed.]              SDOH assessments and interventions completed:  Yes  SDOH Interventions Today    Flowsheet Row Most Recent Value  SDOH Interventions   Food Insecurity Interventions Other (Comment)  [patient states he has a U card that he uses for food/ groceries.]  Housing Interventions Other (Comment)  [patient states he is looking for housing. He reports having assistance. Declines RNCM offer to referral to social worker for housing assistance.]        Care Coordination Interventions:  Yes, provided   Follow up plan: No further intervention required.   Encounter Outcome:  Patient Visit Completed  George Ina RN,BSN,CCM [place]  [facility], [facility] coordinator / Case Manager Phone: [phone]

## 2023-02-08 NOTE — Patient Instructions (Signed)
Visit Information  Thank you for taking time to visit with me today. Your care coordination goals have been met. .   What we discussed today:   Goals Addressed             This Visit's Progress    COMPLETED: Post hospital follow up / management of health conditions       Interventions Today    Flowsheet Row Most Recent Value  Chronic Disease   Chronic disease during today's visit Chronic Obstructive Pulmonary Disease (COPD), Congestive Heart Failure (CHF)  General Interventions   General Interventions Discussed/Reviewed General Interventions Reviewed, Doctor Visits  [evaluation of current treatment plan for COPD/ HF and patients adherence to plan as established by provider. Assessed for COPD/HF symptoms.]  Doctor Visits Discussed/Reviewed Doctor Visits Reviewed  Annabell Sabal upcoming provider visits. Advised to keep follow up appointments with providers as recommended. Advised to contact provider if care coordination services needed in the future.]  Education Interventions   Education Provided Provided Education  [Reviewed signs/ symptoms of COPD/ HF.  Advised to be aware of increase symptoms and report to provider as soon as noticed.  Advised to call 911 for severe symptoms.]  Provided Verbal Education On Other  [discussed importance of having a pulse oximetry to measure oxygen level.  Researched pulse oximetry's and cost on [employer] and provided patient information.]  Nutrition Interventions   Nutrition Discussed/Reviewed Decreasing salt, Nutrition Reviewed  Pharmacy Interventions   Pharmacy Dicussed/Reviewed Pharmacy Topics Reviewed  [reviewed medications. Advised to take medications as prescribed.]              Please contact your provider if you feel care coordination services are needed in the future. .   If you are experiencing a Mental Health or Behavioral Health Crisis or need someone to talk to, please call the Suicide and Crisis Lifeline: 988 call 1-800-273-TALK (toll  free, 24 hour hotline)  The patient verbalized understanding of instructions, educational materials, and care plan provided today and agreed to receive a mailed copy of patient instructions, educational materials, and care plan.   George Ina RN,BSN,CCM [place]  [facility], [facility] coordinator / Case Manager Phone: [phone]

## 2023-02-13 ENCOUNTER — Ambulatory Visit: Payer: 59 | Attending: Student in an Organized Health Care Education/Training Program

## 2023-02-13 ENCOUNTER — Ambulatory Visit: Payer: 59

## 2023-02-13 DIAGNOSIS — J439 Emphysema, unspecified: Secondary | ICD-10-CM | POA: Insufficient documentation

## 2023-02-13 MED ORDER — ALBUTEROL SULFATE (2.5 MG/3ML) 0.083% IN NEBU
2.5000 mg | INHALATION_SOLUTION | Freq: Once | RESPIRATORY_TRACT | Status: DC
  Filled 2023-02-13: qty 3

## 2023-02-14 ENCOUNTER — Ambulatory Visit: Payer: 59 | Admitting: Student in an Organized Health Care Education/Training Program

## 2023-02-18 LAB — PULMONARY FUNCTION TEST ARMC ONLY
DL/VA % pred: 42 %
DL/VA: 1.69 ml/min/mmHg/L
DLCO unc % pred: 40 %
DLCO unc: 10.21 ml/min/mmHg
FEF 25-75 Post: 2.31 L/s
FEF 25-75 Pre: 1.23 L/s
FEF2575-%Change-Post: 88 %
FEF2575-%Pred-Post: 104 %
FEF2575-%Pred-Pre: 55 %
FEV1-%Change-Post: 31 %
FEV1-%Pred-Post: 84 %
FEV1-%Pred-Pre: 64 %
FEV1-Post: 2.58 L
FEV1-Pre: 1.97 L
FEV1FVC-%Change-Post: 13 %
FEV1FVC-%Pred-Pre: 82 %
FEV6-%Change-Post: 13 %
FEV6-%Pred-Post: 93 %
FEV6-%Pred-Pre: 82 %
FEV6-Post: 3.7 L
FEV6-Pre: 3.27 L
FEV6FVC-%Pred-Post: 106 %
FEV6FVC-%Pred-Pre: 106 %
FVC-%Change-Post: 15 %
FVC-%Pred-Post: 88 %
FVC-%Pred-Pre: 77 %
FVC-Post: 3.76 L
FVC-Pre: 3.27 L
Post FEV1/FVC ratio: 69 %
Post FEV6/FVC ratio: 100 %
Pre FEV1/FVC ratio: 60 %
Pre FEV6/FVC Ratio: 100 %
RV % pred: 173 %
RV: 4.42 L
TLC % pred: 104 %
TLC: 7.36 L

## 2023-02-19 ENCOUNTER — Ambulatory Visit (INDEPENDENT_AMBULATORY_CARE_PROVIDER_SITE_OTHER): Payer: 59 | Admitting: Family

## 2023-02-19 ENCOUNTER — Encounter: Payer: Self-pay | Admitting: Family

## 2023-02-19 ENCOUNTER — Ambulatory Visit: Payer: 59 | Admitting: Student in an Organized Health Care Education/Training Program

## 2023-02-19 VITALS — BP 140/80 | HR 88 | Ht 73.0 in | Wt 154.4 lb

## 2023-02-19 DIAGNOSIS — I10 Essential (primary) hypertension: Secondary | ICD-10-CM | POA: Diagnosis not present

## 2023-02-19 DIAGNOSIS — J42 Unspecified chronic bronchitis: Secondary | ICD-10-CM

## 2023-02-19 DIAGNOSIS — F419 Anxiety disorder, unspecified: Secondary | ICD-10-CM

## 2023-02-19 DIAGNOSIS — G629 Polyneuropathy, unspecified: Secondary | ICD-10-CM | POA: Diagnosis not present

## 2023-02-19 MED ORDER — ALPRAZOLAM 0.5 MG PO TABS: 0.5000 mg | ORAL_TABLET | Freq: Three times a day (TID) | ORAL | 0 refills | Status: DC | PRN

## 2023-02-19 MED ORDER — GABAPENTIN 100 MG PO CAPS: 100.0000 mg | ORAL_CAPSULE | Freq: Three times a day (TID) | ORAL | 1 refills | Status: DC

## 2023-02-22 ENCOUNTER — Telehealth (HOSPITAL_COMMUNITY): Payer: Self-pay | Admitting: Emergency Medicine

## 2023-02-22 ENCOUNTER — Other Ambulatory Visit (HOSPITAL_COMMUNITY): Payer: Self-pay | Admitting: Internal Medicine

## 2023-02-22 ENCOUNTER — Other Ambulatory Visit (HOSPITAL_COMMUNITY): Payer: Self-pay | Admitting: Emergency Medicine

## 2023-02-22 NOTE — Telephone Encounter (Signed)
During our visit, Quentarius advised that he needed to get his toenails clipped. With him being on a blood thinner and his decreased mobility, what would you think about referring him to a podiatrist? Pt was agreeable to concept if you approve.   [name] EMT-P Community Paramedic  [phone]

## 2023-02-22 NOTE — Progress Notes (Signed)
Paramedicine Encounter    Patient ID: Bruce [name], male    DOB: 12-29-1945, 77 y.o.   MRN: [id number]   Complaints - fall last Friday, pain in left hip. No inc shortness of breath. Decreased taste.   Assessment - +2 bilateral pedal edema, lung sounds clear  Compliance with meds - missed 3 doses worth of medication   Pill box filled - for 2x weeks   Refills needed - fish oil, multivitamin, vit D, potassium, pain patch, requip.   Meds changes since last visit - added gabapentin, increased alprazolam     Social changes - none   VISIT SUMMARY**  I met with Bruce [name] in his home. Pt is adjusting nicely to his change of address. Pt reported that he had a fall last Friday where he lost his balance and fell on his left hip. Pt denied being evaluated for same, but advised residual pain and requested a refill on his pain patches. Pt denied any increase in shortness of breath, any chest pain, or increased dizziness. I did note significant increase in edema in bilateral lower extremities, however no weight change noted. Pt was advised to watch his intake. Pt advised that he at a "Home cooking" restaurant. Pt was also advised to wear his compression stockings. Pt advised that he would. Pt's pill box was filled for 2x weeks. H.Karleen Hampshire will follow up in 2x weeks.    BP (!) 152/100   Pulse 88   Resp 16   Wt 154 lb 3.2 oz (69.9 kg)   SpO2 99% Comment: on 3 L  BMI 20.34 kg/m  Weight yesterday-154 lbs  Last visit weight-158 lbs      [name] EMT-P Community Paramedic  [phone]    ACTION: Home visit completed     Patient Care Team: Miki Kins, FNP as PCP - General (Family Medicine) Laurier Nancy, MD as PCP - Cardiology (Cardiology)  Patient Active Problem List   Diagnosis Date Noted   Intermittent claudication (HCC) 12/18/2022   B12 deficiency 12/18/2022   MDD (major depressive disorder), recurrent episode, moderate (HCC) 09/01/2022   Pulmonary hypertension,  unspecified (HCC) 04/14/2022   Chronic systolic heart failure (HCC) 04/13/2022   Pulmonary embolism (HCC) 04/09/2022   PAF (paroxysmal atrial fibrillation) (HCC)    NSTEMI (non-ST elevated myocardial infarction) (HCC) 10/10/2018   Syncope 04/28/2018   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   Limb ischemia 11/18/2013   Acute blood loss anemia 10/08/2013   Anxiety disorder 10/08/2013   Dyslipidemia 10/08/2013   GERD (gastroesophageal reflux disease) 10/08/2013   Anticoagulated 10/08/2013   Hypertension    CAD (coronary artery disease) 08/16/2010   COPD (chronic obstructive pulmonary disease) (HCC) 08/16/2010   DVT of leg (deep venous thrombosis) (HCC) 08/16/2010    Current Outpatient Medications:    albuterol (VENTOLIN HFA) 108 (90 Base) MCG/ACT inhaler, TAKE TWO PUFFS BY MOUTH AS NEEDED FOR WHEEZING EVERY 4-6 HOURS, Disp: 8.5 g, Rfl: 5   ALPRAZolam (XANAX) 0.5 MG tablet, Take 1 tablet (0.5 mg total) by mouth 3 (three) times daily as needed for sleep or anxiety., Disp: 90 tablet, Rfl: 0   amiodarone (PACERONE) 200 MG tablet, Take 0.5 tablets (100 mg total) by mouth daily., Disp: 15 tablet, Rfl: 11   apixaban (ELIQUIS) 5 MG TABS tablet, TAKE ONE TABLET (5 MG TOTAL) BY MOUTH TWO (TWO) TIMES DAILY., Disp: 60 tablet, Rfl: 11   baclofen (LIORESAL) 10 MG tablet, TAKE ONE TABLET (10 MG TOTAL) BY MOUTH AT BEDTIME AS NEEDED  FOR MUSCLE SPASMS., Disp: 30 tablet, Rfl: 0   cetirizine (ZYRTEC) 10 MG tablet, Take 1 tablet (10 mg total) by mouth daily., Disp: 30 tablet, Rfl: 11   cholecalciferol (VITAMIN D3) 25 MCG (1000 UNIT) tablet, Take 1,000 Units by mouth daily., Disp: , Rfl:    digoxin (LANOXIN) 0.125 MG tablet, TAKE ONE TABLET (0.125 MG TOTAL) BY MOUTH DAILY., Disp: 30 tablet, Rfl: 6   FARXIGA 10 MG TABS tablet, TAKE ONE TABLET (10 MG TOTAL) BY MOUTH DAILY., Disp: 90 tablet, Rfl: 1   gabapentin (NEURONTIN) 100 MG capsule, Take 1 capsule (100 mg total) by mouth 3 (three) times daily., Disp: 90 capsule, Rfl:  1   lidocaine (LIDODERM) 5 %, Place 1 patch onto the skin daily. Remove & Discard patch within 12 hours or as directed by MD, Disp: 30 patch, Rfl: 11   losartan (COZAAR) 25 MG tablet, TAKE 0.5 TABLETS (12.5 MG TOTAL) BY MOUTH AT BEDTIME., Disp: 50 tablet, Rfl: 3   Multiple Vitamin (MULTIVITAMIN) tablet, Take 1 tablet by mouth daily. For Men, Disp: , Rfl:    nitroGLYCERIN (NITROSTAT) 0.4 MG SL tablet, Place 0.4 mg under the tongue every 5 (five) minutes as needed for chest pain., Disp: , Rfl:    Omega-3 Fatty Acids (FISH OIL PO), Take 1,000 mg by mouth every evening., Disp: , Rfl:    omeprazole (PRILOSEC) 40 MG capsule, Take 40 mg by mouth 2 (two) times daily., Disp: , Rfl:    pantoprazole (PROTONIX) 40 MG tablet, Take 1 tablet (40 mg total) by mouth daily., Disp: 90 tablet, Rfl: 3   Potassium Chloride (KLOR-CON PO), Take 40 mEq by mouth daily., Disp: , Rfl:    ranolazine (RANEXA) 500 MG 12 hr tablet, TAKE ONE TABLET (500 MG TOTAL) BY MOUTH TWO (TWO) TIMES DAILY., Disp: 60 tablet, Rfl: 6   rOPINIRole (REQUIP) 1 MG tablet, TAKE ONE TABLET (1 MG TOTAL) BY MOUTH IN THE MORNING AND AT BEDTIME., Disp: 60 tablet, Rfl: 3   rosuvastatin (CRESTOR) 10 MG tablet, TAKE ONE TABLET (10 MG TOTAL) BY MOUTH DAILY., Disp: 100 tablet, Rfl: 3   sildenafil (REVATIO) 20 MG tablet, Take 1 tablet (20 mg total) by mouth 3 (three) times daily., Disp: 90 tablet, Rfl: 5   Tiotropium Bromide-Olodaterol (STIOLTO RESPIMAT) 2.5-2.5 MCG/ACT AERS, Inhale 2 puffs into the lungs daily., Disp: 60 each, Rfl: 12 No current facility-administered medications for this visit.  Facility-Administered Medications Ordered in Other Visits:    albuterol (PROVENTIL) (2.5 MG/3ML) 0.083% nebulizer solution 2.5 mg, 2.5 mg, Nebulization, Once, Raechel Chute, MD Allergies  Allergen Reactions   Adhesive [Tape] Other (See Comments)    After right leg fracture surgery, pt developed a large blister where tape was applied to his right leg. OK to use  paper tape.   Eggs-Apples-Oats [Alitraq] Other (See Comments)    Caused chest pains   Imdur [Isosorbide Dinitrate] Other (See Comments)    hallucinations   Singulair [Montelukast Sodium] Other (See Comments)    Hallucinations      Social History   Socioeconomic History   Marital status: Single    Spouse name: Not on file   Number of children: 2   Years of education: Not on file   Highest education level: 6th grade  Occupational History   Not on file  Tobacco Use   Smoking status: Former    Current packs/day: 0.00    Average packs/day: 0.5 packs/day for 52.0 years (26.0 ttl pk-yrs)    Types: Cigarettes  Start date: 07/05/1970    Quit date: 07/05/2022    Years since quitting: 0.6   Smokeless tobacco: Never  Vaping Use   Vaping status: Never Used  Substance and Sexual Activity   Alcohol use: No    Alcohol/week: 0.0 standard drinks of alcohol    Comment: Stopped 2009   Drug use: No   Sexual activity: Not Currently  Other Topics Concern   Not on file  Social History Narrative   ** Merged History Encounter **       Social Drivers of Health   Financial Resource Strain: High Risk (04/12/2022)   Overall Financial Resource Strain (CARDIA)    Difficulty of Paying Living Expenses: Hard  Food Insecurity: No Food Insecurity (02/08/2023)   Hunger Vital Sign    Worried About Running Out of Food in the Last Year: Never true    Ran Out of Food in the Last Year: Never true  Transportation Needs: No Transportation Needs (05/23/2022)   PRAPARE - Administrator, Civil Service (Medical): No    Lack of Transportation (Non-Medical): No  Physical Activity: Inactive (02/05/2017)   Exercise Vital Sign    Days of Exercise per Week: 0 days    Minutes of Exercise per Session: 0 min  Stress: Stress Concern Present (02/05/2017)   [employer] of Occupational Health - Occupational Stress Questionnaire    Feeling of Stress : Rather much  Social Connections: Moderately Isolated  (02/05/2017)   Social Connection and Isolation Panel [NHANES]    Frequency of Communication with Friends and Family: More than three times a week    Frequency of Social Gatherings with Friends and Family: Once a week    Attends Religious Services: Never    Database administrator or Organizations: No    Attends Banker Meetings: Never    Marital Status: Divorced  Catering manager Violence: Not At Risk (04/14/2022)   Humiliation, Afraid, Rape, and Kick questionnaire    Fear of Current or Ex-Partner: No    Emotionally Abused: No    Physically Abused: No    Sexually Abused: No    Physical Exam      Future Appointments  Date Time Provider Department Center  03/20/2023 10:00 AM AMA DG/CT AMA-DG None  04/23/2023  9:30 AM Miki Kins, FNP AMA-AMA None

## 2023-03-04 DIAGNOSIS — J9601 Acute respiratory failure with hypoxia: Secondary | ICD-10-CM | POA: Diagnosis not present

## 2023-03-04 DIAGNOSIS — J42 Unspecified chronic bronchitis: Secondary | ICD-10-CM | POA: Diagnosis not present

## 2023-03-04 DIAGNOSIS — J449 Chronic obstructive pulmonary disease, unspecified: Secondary | ICD-10-CM | POA: Diagnosis not present

## 2023-03-06 ENCOUNTER — Other Ambulatory Visit (HOSPITAL_COMMUNITY): Payer: Self-pay | Admitting: Internal Medicine

## 2023-03-06 ENCOUNTER — Other Ambulatory Visit (HOSPITAL_COMMUNITY): Payer: Self-pay

## 2023-03-06 NOTE — Progress Notes (Signed)
 Paramedicine Encounter    Patient ID: Bruce [name], male    DOB: [birth year], 77 y.o.   MRN: [id number]   Complaints - chronic  bilateral leg and hip pain   Assessment - diminished lungs sounds on lower right side, chronic cough. increased edema above sock line. Wearing oxygen  3LPM nasal cannula. No chest pain, no dizziness.   Compliance with meds- one missed dose  Pill box filled- for two weeks   Refills needed - multivitamin, potassium, fish oil, gabapentin , vitamin D    Meds changes since last visit- none     Social changes- none    VISIT SUMMARY**  Arrived for home visit for [facility] who reports to be sore and short of breath which is normal at baseline for him. He is wearing his oxygen  at 3LPM. He gets short of breath and fatigued on exertion. Lungs clear except slightly diminished on the lower right side. Meds reviewed and confirmed. Pill box filled for two weeks. Refills as noted. Will follow up in 2x weeks.    BP (!) 120/58   Pulse 100   SpO2 93% Comment: 4L O2 Weight yesterday-- didn't weigh  Last visit weight-154 lbs    Powell Mirza, EMT-Paramedic [phone] 03/06/2023    ACTION: Home visit completed     Patient Care Team: Orlean Alan HERO, FNP as PCP - General (Family Medicine) Fernand Denyse LABOR, MD as PCP - Cardiology (Cardiology)  Patient Active Problem List   Diagnosis Date Noted   Intermittent claudication (HCC) 12/18/2022   B12 deficiency 12/18/2022   MDD (major depressive disorder), recurrent episode, moderate (HCC) 09/01/2022   Pulmonary hypertension, unspecified (HCC) 04/14/2022   Chronic systolic heart failure (HCC) 04/13/2022   Pulmonary embolism (HCC) 04/09/2022   PAF (paroxysmal atrial fibrillation) (HCC)    NSTEMI (non-ST elevated myocardial infarction) (HCC) 10/10/2018   Syncope 04/28/2018   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   Limb ischemia 11/18/2013   Acute blood loss anemia 10/08/2013   Anxiety disorder 10/08/2013   Dyslipidemia  10/08/2013   GERD (gastroesophageal reflux disease) 10/08/2013   Anticoagulated 10/08/2013   Hypertension    CAD (coronary artery disease) 08/16/2010   COPD (chronic obstructive pulmonary disease) (HCC) 08/16/2010   DVT of leg (deep venous thrombosis) (HCC) 08/16/2010    Current Outpatient Medications:    albuterol  (VENTOLIN  HFA) 108 (90 Base) MCG/ACT inhaler, TAKE TWO PUFFS BY MOUTH AS NEEDED FOR WHEEZING EVERY 4-6 HOURS, Disp: 8.5 g, Rfl: 5   ALPRAZolam  (XANAX ) 0.5 MG tablet, Take 1 tablet (0.5 mg total) by mouth 3 (three) times daily as needed for sleep or anxiety., Disp: 90 tablet, Rfl: 0   amiodarone  (PACERONE ) 200 MG tablet, Take 0.5 tablets (100 mg total) by mouth daily., Disp: 15 tablet, Rfl: 11   apixaban  (ELIQUIS ) 5 MG TABS tablet, TAKE ONE TABLET (5 MG TOTAL) BY MOUTH TWO (TWO) TIMES DAILY., Disp: 60 tablet, Rfl: 11   baclofen  (LIORESAL ) 10 MG tablet, TAKE ONE TABLET (10 MG TOTAL) BY MOUTH AT BEDTIME AS NEEDED FOR MUSCLE SPASMS., Disp: 30 tablet, Rfl: 0   cholecalciferol  (VITAMIN D3) 25 MCG (1000 UNIT) tablet, Take 1,000 Units by mouth daily., Disp: , Rfl:    FARXIGA  10 MG TABS tablet, TAKE ONE TABLET (10 MG TOTAL) BY MOUTH DAILY., Disp: 90 tablet, Rfl: 1   gabapentin  (NEURONTIN ) 100 MG capsule, Take 1 capsule (100 mg total) by mouth 3 (three) times daily., Disp: 90 capsule, Rfl: 1   lidocaine  (LIDODERM ) 5 %, Place 1 patch onto the skin daily.  Remove & Discard patch within 12 hours or as directed by MD, Disp: 30 patch, Rfl: 11   losartan  (COZAAR ) 25 MG tablet, TAKE 0.5 TABLETS (12.5 MG TOTAL) BY MOUTH AT BEDTIME., Disp: 50 tablet, Rfl: 3   Multiple Vitamin (MULTIVITAMIN) tablet, Take 1 tablet by mouth daily. For Men, Disp: , Rfl:    nitroGLYCERIN  (NITROSTAT ) 0.4 MG SL tablet, Place 0.4 mg under the tongue every 5 (five) minutes as needed for chest pain., Disp: , Rfl:    Omega-3 Fatty Acids (FISH OIL PO), Take 1,000 mg by mouth every evening., Disp: , Rfl:    omeprazole  (PRILOSEC) 40  MG capsule, Take 40 mg by mouth 2 (two) times daily., Disp: , Rfl:    pantoprazole  (PROTONIX ) 40 MG tablet, Take 1 tablet (40 mg total) by mouth daily., Disp: 90 tablet, Rfl: 3   Potassium Chloride  (KLOR-CON  PO), Take 40 mEq by mouth daily., Disp: , Rfl:    ranolazine  (RANEXA ) 500 MG 12 hr tablet, TAKE ONE TABLET (500 MG TOTAL) BY MOUTH TWO (TWO) TIMES DAILY., Disp: 60 tablet, Rfl: 6   rOPINIRole  (REQUIP ) 1 MG tablet, TAKE ONE TABLET (1 MG TOTAL) BY MOUTH IN THE MORNING AND AT BEDTIME., Disp: 60 tablet, Rfl: 3   rosuvastatin  (CRESTOR ) 10 MG tablet, TAKE ONE TABLET (10 MG TOTAL) BY MOUTH DAILY., Disp: 100 tablet, Rfl: 3   sildenafil  (REVATIO ) 20 MG tablet, Take 1 tablet (20 mg total) by mouth 3 (three) times daily., Disp: 90 tablet, Rfl: 5   Tiotropium Bromide-Olodaterol (STIOLTO RESPIMAT ) 2.5-2.5 MCG/ACT AERS, Inhale 2 puffs into the lungs daily., Disp: 60 each, Rfl: 12   cetirizine  (ZYRTEC ) 10 MG tablet, Take 1 tablet (10 mg total) by mouth daily., Disp: 30 tablet, Rfl: 11   digoxin  (LANOXIN ) 0.125 MG tablet, TAKE ONE TABLET (0.125 MG TOTAL) BY MOUTH DAILY., Disp: 30 tablet, Rfl: 6 No current facility-administered medications for this visit.  Facility-Administered Medications Ordered in Other Visits:    albuterol  (PROVENTIL ) (2.5 MG/3ML) 0.083% nebulizer solution 2.5 mg, 2.5 mg, Nebulization, Once, Isadora Hose, MD Allergies  Allergen Reactions   Adhesive [Tape] Other (See Comments)    After right leg fracture surgery, pt developed a large blister where tape was applied to his right leg. OK to use paper tape.   Eggs-Apples-Oats [Alitraq] Other (See Comments)    Caused chest pains   Imdur  [Isosorbide  Dinitrate] Other (See Comments)    hallucinations   Singulair  [Montelukast  Sodium] Other (See Comments)    Hallucinations      Social History   Socioeconomic History   Marital status: Single    Spouse name: Not on file   Number of children: 2   Years of education: Not on file    Highest education level: 6th grade  Occupational History   Not on file  Tobacco Use   Smoking status: Former    Current packs/day: 0.00    Average packs/day: 0.5 packs/day for 52.0 years (26.0 ttl pk-yrs)    Types: Cigarettes    Start date: 07/05/1970    Quit date: 07/05/2022    Years since quitting: 0.6   Smokeless tobacco: Never  Vaping Use   Vaping status: Never Used  Substance and Sexual Activity   Alcohol use: No    Alcohol/week: 0.0 standard drinks of alcohol    Comment: Stopped 2009   Drug use: No   Sexual activity: Not Currently  Other Topics Concern   Not on file  Social History Narrative   ** Merged  History Encounter **       Social Drivers of Health   Financial Resource Strain: High Risk (04/12/2022)   Overall Financial Resource Strain (CARDIA)    Difficulty of Paying Living Expenses: Hard  Food Insecurity: No Food Insecurity (02/08/2023)   Hunger Vital Sign    Worried About Running Out of Food in the Last Year: Never true    Ran Out of Food in the Last Year: Never true  Transportation Needs: No Transportation Needs (05/23/2022)   PRAPARE - Administrator, Civil Service (Medical): No    Lack of Transportation (Non-Medical): No  Physical Activity: Inactive (02/05/2017)   Exercise Vital Sign    Days of Exercise per Week: 0 days    Minutes of Exercise per Session: 0 min  Stress: Stress Concern Present (02/05/2017)   [employer] of Occupational Health - Occupational Stress Questionnaire    Feeling of Stress : Rather much  Social Connections: Moderately Isolated (02/05/2017)   Social Connection and Isolation Panel [NHANES]    Frequency of Communication with Friends and Family: More than three times a week    Frequency of Social Gatherings with Friends and Family: Once a week    Attends Religious Services: Never    Database Administrator or Organizations: No    Attends Banker Meetings: Never    Marital Status: Divorced  Catering Manager  Violence: Not At Risk (04/14/2022)   Humiliation, Afraid, Rape, and Kick questionnaire    Fear of Current or Ex-Partner: No    Emotionally Abused: No    Physically Abused: No    Sexually Abused: No    Physical Exam      Future Appointments  Date Time Provider Department Center  03/20/2023 10:00 AM AMA DG/CT AMA-DG None  04/23/2023  9:30 AM Orlean Alan HERO, FNP AMA-AMA None

## 2023-03-16 ENCOUNTER — Other Ambulatory Visit: Payer: Self-pay | Admitting: Family

## 2023-03-19 ENCOUNTER — Other Ambulatory Visit: Payer: Self-pay | Admitting: Student in an Organized Health Care Education/Training Program

## 2023-03-19 ENCOUNTER — Other Ambulatory Visit: Payer: Self-pay | Admitting: Family

## 2023-03-20 ENCOUNTER — Other Ambulatory Visit (HOSPITAL_COMMUNITY): Payer: Self-pay

## 2023-03-20 ENCOUNTER — Other Ambulatory Visit: Payer: 59

## 2023-03-20 NOTE — Progress Notes (Signed)
 Paramedicine Encounter    Patient ID: ULICE FOLLETT, male    DOB: 10-19-1945, 78 y.o.   MRN: [id number]   Complaints- Bilateral leg pain, muscle cramps, shortness of breath   Assessment- Short of breath upon ambulating, lower leg edema, left leg worse than right, lungs noting slightly diminished sounds, vitals obtained  Compliance with meds- missed one week and 4 days of meds   Pill box filled- two weeks   Refills needed- baclofen , flonase , potassium   Meds changes since last visit- NONE     Social changes- NONE    VISIT SUMMARY**  Arrived for home visit for [facility] who is short of breath upon walking with no oxygen  on at the time of my arrival- he is to be wearing O2 all the time but his portable was plugged in the wall and the concentrator was in his room. He immediately went to put it on and had relief once resting. Vitals obtained as noted. I reviewed meds- he missed one week and 4 days of meds. He says this is because he has been stressed. I explained the importance of taking his meds and that the reason his breathing is worse and legs have been hurting and are swollen is due to missing all his meds. He was educated on med compliance and importance of taking same. He verbalized understanding and that he would try to do better. Pill boxes filled and confirmed. Home visit complete. I will see Douglas in two weeks.   BP 130/82   Pulse 87   Resp 20   Wt 166 lb (75.3 kg)   SpO2 90%   BMI 21.90 kg/m  Weight yesterday-- did not weigh  Last visit weight-- 153lbs     ACTION: Home visit completed     Patient Care Team: Orlean Alan HERO, FNP as PCP - General (Family Medicine) Fernand Denyse LABOR, MD as PCP - Cardiology (Cardiology)  Patient Active Problem List   Diagnosis Date Noted   Intermittent claudication (HCC) 12/18/2022   B12 deficiency 12/18/2022   MDD (major depressive disorder), recurrent episode, moderate (HCC) 09/01/2022   Pulmonary hypertension, unspecified  (HCC) 04/14/2022   Chronic systolic heart failure (HCC) 04/13/2022   Pulmonary embolism (HCC) 04/09/2022   PAF (paroxysmal atrial fibrillation) (HCC)    NSTEMI (non-ST elevated myocardial infarction) (HCC) 10/10/2018   Syncope 04/28/2018   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   Limb ischemia 11/18/2013   Acute blood loss anemia 10/08/2013   Anxiety disorder 10/08/2013   Dyslipidemia 10/08/2013   GERD (gastroesophageal reflux disease) 10/08/2013   Anticoagulated 10/08/2013   Hypertension    CAD (coronary artery disease) 08/16/2010   COPD (chronic obstructive pulmonary disease) (HCC) 08/16/2010   DVT of leg (deep venous thrombosis) (HCC) 08/16/2010    Current Outpatient Medications:    albuterol  (VENTOLIN  HFA) 108 (90 Base) MCG/ACT inhaler, TAKE TWO PUFFS BY MOUTH AS NEEDED FOR WHEEZING EVERY 4-6 HOURS, Disp: 8.5 g, Rfl: 5   ALPRAZolam  (XANAX ) 0.5 MG tablet, TAKE ONE TABLET BY MOUTH THREE TIMES A DAY AS NEEDED FOR SLEEP OR ANXIETY, Disp: 90 tablet, Rfl: 0   amiodarone  (PACERONE ) 200 MG tablet, Take 0.5 tablets (100 mg total) by mouth daily., Disp: 15 tablet, Rfl: 11   apixaban  (ELIQUIS ) 5 MG TABS tablet, TAKE ONE TABLET (5 MG TOTAL) BY MOUTH TWO (TWO) TIMES DAILY., Disp: 60 tablet, Rfl: 11   baclofen  (LIORESAL ) 10 MG tablet, TAKE ONE TABLET (10 MG TOTAL) BY MOUTH AT BEDTIME AS NEEDED FOR MUSCLE SPASMS., Disp:  30 tablet, Rfl: 0   cetirizine  (ZYRTEC ) 10 MG tablet, Take 1 tablet (10 mg total) by mouth daily., Disp: 30 tablet, Rfl: 11   cholecalciferol  (VITAMIN D3) 25 MCG (1000 UNIT) tablet, Take 1,000 Units by mouth daily., Disp: , Rfl:    digoxin  (LANOXIN ) 0.125 MG tablet, TAKE ONE TABLET (0.125 MG TOTAL) BY MOUTH DAILY., Disp: 30 tablet, Rfl: 6   FARXIGA  10 MG TABS tablet, TAKE ONE TABLET (10 MG TOTAL) BY MOUTH DAILY., Disp: 90 tablet, Rfl: 1   gabapentin  (NEURONTIN ) 100 MG capsule, Take 1 capsule (100 mg total) by mouth 3 (three) times daily., Disp: 90 capsule, Rfl: 1   lidocaine  (LIDODERM ) 5 %,  Place 1 patch onto the skin daily. Remove & Discard patch within 12 hours or as directed by MD, Disp: 30 patch, Rfl: 11   losartan  (COZAAR ) 25 MG tablet, TAKE 0.5 TABLETS (12.5 MG TOTAL) BY MOUTH AT BEDTIME., Disp: 50 tablet, Rfl: 3   Multiple Vitamin (MULTIVITAMIN) tablet, Take 1 tablet by mouth daily. For Men, Disp: , Rfl:    nitroGLYCERIN  (NITROSTAT ) 0.4 MG SL tablet, Place 0.4 mg under the tongue every 5 (five) minutes as needed for chest pain., Disp: , Rfl:    Omega-3 Fatty Acids (FISH OIL PO), Take 1,000 mg by mouth every evening., Disp: , Rfl:    omeprazole  (PRILOSEC) 40 MG capsule, Take 40 mg by mouth 2 (two) times daily., Disp: , Rfl:    pantoprazole  (PROTONIX ) 40 MG tablet, Take 1 tablet (40 mg total) by mouth daily., Disp: 90 tablet, Rfl: 3   Potassium Chloride  (KLOR-CON  PO), Take 40 mEq by mouth daily., Disp: , Rfl:    ranolazine  (RANEXA ) 500 MG 12 hr tablet, TAKE ONE TABLET (500 MG TOTAL) BY MOUTH TWO (TWO) TIMES DAILY., Disp: 60 tablet, Rfl: 6   rOPINIRole  (REQUIP ) 1 MG tablet, TAKE ONE TABLET (1 MG TOTAL) BY MOUTH IN THE MORNING AND AT BEDTIME., Disp: 60 tablet, Rfl: 3   rosuvastatin  (CRESTOR ) 10 MG tablet, TAKE ONE TABLET (10 MG TOTAL) BY MOUTH DAILY., Disp: 100 tablet, Rfl: 3   sildenafil  (REVATIO ) 20 MG tablet, Take 1 tablet (20 mg total) by mouth 3 (three) times daily., Disp: 90 tablet, Rfl: 5   Tiotropium Bromide-Olodaterol (STIOLTO RESPIMAT ) 2.5-2.5 MCG/ACT AERS, Inhale 2 puffs into the lungs daily., Disp: 60 each, Rfl: 12 No current facility-administered medications for this visit.  Facility-Administered Medications Ordered in Other Visits:    albuterol  (PROVENTIL ) (2.5 MG/3ML) 0.083% nebulizer solution 2.5 mg, 2.5 mg, Nebulization, Once, Isadora Hose, MD Allergies  Allergen Reactions   Adhesive [Tape] Other (See Comments)    After right leg fracture surgery, pt developed a large blister where tape was applied to his right leg. OK to use paper tape.   Eggs-Apples-Oats  [Alitraq] Other (See Comments)    Caused chest pains   Imdur  [Isosorbide  Dinitrate] Other (See Comments)    hallucinations   Singulair  [Montelukast  Sodium] Other (See Comments)    Hallucinations      Social History   Socioeconomic History   Marital status: Single    Spouse name: Not on file   Number of children: 2   Years of education: Not on file   Highest education level: 6th grade  Occupational History   Not on file  Tobacco Use   Smoking status: Former    Current packs/day: 0.00    Average packs/day: 0.5 packs/day for 52.0 years (26.0 ttl pk-yrs)    Types: Cigarettes    Start  date: 07/05/1970    Quit date: 07/05/2022    Years since quitting: 0.7   Smokeless tobacco: Never  Vaping Use   Vaping status: Never Used  Substance and Sexual Activity   Alcohol use: No    Alcohol/week: 0.0 standard drinks of alcohol    Comment: Stopped 2009   Drug use: No   Sexual activity: Not Currently  Other Topics Concern   Not on file  Social History Narrative   ** Merged History Encounter **       Social Drivers of Health   Financial Resource Strain: High Risk (04/12/2022)   Overall Financial Resource Strain (CARDIA)    Difficulty of Paying Living Expenses: Hard  Food Insecurity: No Food Insecurity (02/08/2023)   Hunger Vital Sign    Worried About Running Out of Food in the Last Year: Never true    Ran Out of Food in the Last Year: Never true  Transportation Needs: No Transportation Needs (05/23/2022)   PRAPARE - Administrator, Civil Service (Medical): No    Lack of Transportation (Non-Medical): No  Physical Activity: Inactive (02/05/2017)   Exercise Vital Sign    Days of Exercise per Week: 0 days    Minutes of Exercise per Session: 0 min  Stress: Stress Concern Present (02/05/2017)   [employer] of Occupational Health - Occupational Stress Questionnaire    Feeling of Stress : Rather much  Social Connections: Moderately Isolated (02/05/2017)   Social Connection  and Isolation Panel [NHANES]    Frequency of Communication with Friends and Family: More than three times a week    Frequency of Social Gatherings with Friends and Family: Once a week    Attends Religious Services: Never    Database Administrator or Organizations: No    Attends Banker Meetings: Never    Marital Status: Divorced  Catering Manager Violence: Not At Risk (04/14/2022)   Humiliation, Afraid, Rape, and Kick questionnaire    Fear of Current or Ex-Partner: No    Emotionally Abused: No    Physically Abused: No    Sexually Abused: No    Physical Exam      Future Appointments  Date Time Provider Department Center  04/23/2023  9:30 AM Orlean Alan HERO, FNP AMA-AMA None

## 2023-03-22 ENCOUNTER — Other Ambulatory Visit: Payer: Self-pay | Admitting: Family

## 2023-03-27 ENCOUNTER — Ambulatory Visit
Admission: RE | Admit: 2023-03-27 | Discharge: 2023-03-27 | Disposition: A | Discharge status: Home / Self Care | Payer: 59 | Source: Ambulatory Visit | Attending: Family | Admitting: Family

## 2023-03-27 ENCOUNTER — Ambulatory Visit: Payer: 59 | Admitting: Student in an Organized Health Care Education/Training Program

## 2023-03-27 DIAGNOSIS — I739 Peripheral vascular disease, unspecified: Secondary | ICD-10-CM | POA: Diagnosis not present

## 2023-03-27 DIAGNOSIS — I701 Atherosclerosis of renal artery: Secondary | ICD-10-CM | POA: Diagnosis not present

## 2023-03-27 DIAGNOSIS — R600 Localized edema: Secondary | ICD-10-CM | POA: Diagnosis not present

## 2023-03-27 DIAGNOSIS — Q600 Renal agenesis, unilateral: Secondary | ICD-10-CM | POA: Diagnosis not present

## 2023-03-27 LAB — POCT I-STAT CREATININE: Creatinine, Ser: 1.1 mg/dL (ref 0.61–1.24)

## 2023-03-27 MED ORDER — IOHEXOL 350 MG/ML SOLN
80.0000 mL | Freq: Once | INTRAVENOUS | Status: AC | PRN
Start: 2023-03-27 — End: 2023-03-27
  Administered 2023-03-27: 80 mL via INTRAVENOUS

## 2023-04-03 ENCOUNTER — Telehealth (HOSPITAL_COMMUNITY): Payer: Self-pay

## 2023-04-03 NOTE — Telephone Encounter (Signed)
Left message with Royalty to return my call in regards to scheduling home visit for week.   [name], EMT-Paramedic [phone] 04/03/2023

## 2023-04-05 ENCOUNTER — Other Ambulatory Visit (HOSPITAL_COMMUNITY): Payer: Self-pay | Admitting: Internal Medicine

## 2023-04-05 ENCOUNTER — Other Ambulatory Visit (HOSPITAL_COMMUNITY): Payer: Self-pay

## 2023-04-05 ENCOUNTER — Telehealth (HOSPITAL_COMMUNITY): Payer: Self-pay | Admitting: Cardiology

## 2023-04-05 MED ORDER — POTASSIUM CHLORIDE ER 20 MEQ PO TBCR: 40.0000 meq | EXTENDED_RELEASE_TABLET | Freq: Every day | ORAL | 11 refills | Status: DC

## 2023-04-05 NOTE — Progress Notes (Signed)
Paramedicine Encounter    Patient ID: Bruce [name], male    DOB: [birth year], 78 y.o.   MRN: [id number]   Complaints-increased sob   Edema-yes--he said legs look the same as last visit, maybe slightly better  Compliance with meds-missed 1 pm dose for the last 2 wks   Pill box filled-yes X 2 wk --minus the potassium  If so, by whom-paramedic   Refills needed-potassium --needs in pill boxes   Pt reports he's doing alright.     He began to tell me he needs portable 02 again-his broke-they told him he had outstanding balance before it can be replaced  His roommate also asked me about this and from what pt told me he is suppose to call the company to see how much that bill is and then let heather know.  Also c/o dry mouth- I advised him he needs to ask for a water bottle to add on the 02 tank for humidified 02.   He c/o dry mouth and has to drink a lot.  He reports drinking about 2 gals of fluids a day. So we talked about that and cutting that fluid way back.  He does eat out a lot- we talked about the sodium in foods.   He does not have the potassium--I called pharmacy and they have to send request over to the doc.  He did tell me that when he misses a day then the next day he takes 1/2 of the pills and then the other half the next day to get caught up. I told him to stop that immediately and if he realizes he missed a day, do not take extra to catch up. Just continue taking the meds on that day and leave pills in pill box and keep moving forward. He said "oh that may be why I feel bad sometimes" .  Lungs sound a little coarse. No wheezing or fluid noted.  Legs swollen-advise dhim to put on those tall socks he shouwed me--his legs are too swollen to even try to get the compression socks on .    No meds yet today, he is about to get get food.   Meds verified and pill boxes refilled -missing potassium   BP 132/82   Pulse 70   Resp 17   SpO2 96%  Weight yesterday--152 the other day   Last visit weight-166  Patient Care Team: Miki Kins, FNP as PCP - General (Family Medicine) Laurier Nancy, MD as PCP - Cardiology (Cardiology)  Patient Active Problem List   Diagnosis Date Noted   Intermittent claudication (HCC) 12/18/2022   B12 deficiency 12/18/2022   MDD (major depressive disorder), recurrent episode, moderate (HCC) 09/01/2022   Pulmonary hypertension, unspecified (HCC) 04/14/2022   Chronic systolic heart failure (HCC) 04/13/2022   Pulmonary embolism (HCC) 04/09/2022   PAF (paroxysmal atrial fibrillation) (HCC)    NSTEMI (non-ST elevated myocardial infarction) (HCC) 10/10/2018   Syncope 04/28/2018   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   Limb ischemia 11/18/2013   Acute blood loss anemia 10/08/2013   Anxiety disorder 10/08/2013   Dyslipidemia 10/08/2013   GERD (gastroesophageal reflux disease) 10/08/2013   Anticoagulated 10/08/2013   Hypertension    CAD (coronary artery disease) 08/16/2010   COPD (chronic obstructive pulmonary disease) (HCC) 08/16/2010   DVT of leg (deep venous thrombosis) (HCC) 08/16/2010    Current Outpatient Medications:    albuterol (VENTOLIN HFA) 108 (90 Base) MCG/ACT inhaler, TAKE TWO PUFFS BY MOUTH AS NEEDED FOR WHEEZING EVERY  4-6 HOURS, Disp: 8.5 g, Rfl: 5   ALPRAZolam (XANAX) 0.5 MG tablet, TAKE ONE TABLET BY MOUTH THREE TIMES A DAY AS NEEDED FOR SLEEP OR ANXIETY, Disp: 90 tablet, Rfl: 0   amiodarone (PACERONE) 200 MG tablet, Take 0.5 tablets (100 mg total) by mouth daily., Disp: 15 tablet, Rfl: 11   apixaban (ELIQUIS) 5 MG TABS tablet, TAKE ONE TABLET (5 MG TOTAL) BY MOUTH TWO (TWO) TIMES DAILY., Disp: 60 tablet, Rfl: 11   baclofen (LIORESAL) 10 MG tablet, TAKE ONE TABLET (10 MG TOTAL) BY MOUTH AT BEDTIME AS NEEDED FOR MUSCLE SPASMS., Disp: 30 tablet, Rfl: 0   cetirizine (ZYRTEC) 10 MG tablet, Take 1 tablet (10 mg total) by mouth daily., Disp: 30 tablet, Rfl: 11   cholecalciferol (VITAMIN D3) 25 MCG (1000 UNIT) tablet, Take 1,000  Units by mouth daily., Disp: , Rfl:    digoxin (LANOXIN) 0.125 MG tablet, TAKE ONE TABLET (0.125 MG TOTAL) BY MOUTH DAILY., Disp: 30 tablet, Rfl: 6   FARXIGA 10 MG TABS tablet, TAKE ONE TABLET (10 MG TOTAL) BY MOUTH DAILY., Disp: 90 tablet, Rfl: 1   gabapentin (NEURONTIN) 100 MG capsule, Take 1 capsule (100 mg total) by mouth 3 (three) times daily., Disp: 90 capsule, Rfl: 1   losartan (COZAAR) 25 MG tablet, TAKE 0.5 TABLETS (12.5 MG TOTAL) BY MOUTH AT BEDTIME., Disp: 50 tablet, Rfl: 3   Multiple Vitamin (MULTIVITAMIN) tablet, Take 1 tablet by mouth daily. For Men, Disp: , Rfl:    Omega-3 Fatty Acids (FISH OIL PO), Take 1,000 mg by mouth every evening., Disp: , Rfl:    omeprazole (PRILOSEC) 40 MG capsule, Take 40 mg by mouth 2 (two) times daily., Disp: , Rfl:    pantoprazole (PROTONIX) 40 MG tablet, Take 1 tablet (40 mg total) by mouth daily., Disp: 90 tablet, Rfl: 3   Potassium Chloride (KLOR-CON PO), Take 40 mEq by mouth daily., Disp: , Rfl:    ranolazine (RANEXA) 500 MG 12 hr tablet, TAKE ONE TABLET (500 MG TOTAL) BY MOUTH TWO (TWO) TIMES DAILY., Disp: 60 tablet, Rfl: 6   rOPINIRole (REQUIP) 1 MG tablet, TAKE ONE TABLET (1 MG TOTAL) BY MOUTH IN THE MORNING AND AT BEDTIME., Disp: 60 tablet, Rfl: 3   rosuvastatin (CRESTOR) 10 MG tablet, TAKE ONE TABLET (10 MG TOTAL) BY MOUTH DAILY., Disp: 100 tablet, Rfl: 3   sildenafil (REVATIO) 20 MG tablet, Take 1 tablet (20 mg total) by mouth 3 (three) times daily., Disp: 90 tablet, Rfl: 5   Tiotropium Bromide-Olodaterol (STIOLTO RESPIMAT) 2.5-2.5 MCG/ACT AERS, Inhale 2 puffs into the lungs daily., Disp: 60 each, Rfl: 12   lidocaine (LIDODERM) 5 %, Place 1 patch onto the skin daily. Remove & Discard patch within 12 hours or as directed by MD, Disp: 30 patch, Rfl: 11   nitroGLYCERIN (NITROSTAT) 0.4 MG SL tablet, Place 0.4 mg under the tongue every 5 (five) minutes as needed for chest pain., Disp: , Rfl:  No current facility-administered medications for this  visit.  Facility-Administered Medications Ordered in Other Visits:    albuterol (PROVENTIL) (2.5 MG/3ML) 0.083% nebulizer solution 2.5 mg, 2.5 mg, Nebulization, Once, Raechel Chute, MD Allergies  Allergen Reactions   Adhesive [Tape] Other (See Comments)    After right leg fracture surgery, pt developed a large blister where tape was applied to his right leg. OK to use paper tape.   Eggs-Apples-Oats [Alitraq] Other (See Comments)    Caused chest pains   Imdur [Isosorbide Dinitrate] Other (See Comments)  hallucinations   Singulair [Montelukast Sodium] Other (See Comments)    Hallucinations       Social History   Socioeconomic History   Marital status: Single    Spouse name: Not on file   Number of children: 2   Years of education: Not on file   Highest education level: 6th grade  Occupational History   Not on file  Tobacco Use   Smoking status: Former    Current packs/day: 0.00    Average packs/day: 0.5 packs/day for 52.0 years (26.0 ttl pk-yrs)    Types: Cigarettes    Start date: 07/05/1970    Quit date: 07/05/2022    Years since quitting: 0.7   Smokeless tobacco: Never  Vaping Use   Vaping status: Never Used  Substance and Sexual Activity   Alcohol use: No    Alcohol/week: 0.0 standard drinks of alcohol    Comment: Stopped 2009   Drug use: No   Sexual activity: Not Currently  Other Topics Concern   Not on file  Social History Narrative   ** Merged History Encounter **       Social Drivers of Health   Financial Resource Strain: High Risk (04/12/2022)   Overall Financial Resource Strain (CARDIA)    Difficulty of Paying Living Expenses: Hard  Food Insecurity: No Food Insecurity (02/08/2023)   Hunger Vital Sign    Worried About Running Out of Food in the Last Year: Never true    Ran Out of Food in the Last Year: Never true  Transportation Needs: No Transportation Needs (05/23/2022)   PRAPARE - Administrator, Civil Service (Medical): No    Lack of  Transportation (Non-Medical): No  Physical Activity: Inactive (02/05/2017)   Exercise Vital Sign    Days of Exercise per Week: 0 days    Minutes of Exercise per Session: 0 min  Stress: Stress Concern Present (02/05/2017)   [employer] of Occupational Health - Occupational Stress Questionnaire    Feeling of Stress : Rather much  Social Connections: Moderately Isolated (02/05/2017)   Social Connection and Isolation Panel [NHANES]    Frequency of Communication with Friends and Family: More than three times a week    Frequency of Social Gatherings with Friends and Family: Once a week    Attends Religious Services: Never    Database administrator or Organizations: No    Attends Banker Meetings: Never    Marital Status: Divorced  Catering manager Violence: Not At Risk (04/14/2022)   Humiliation, Afraid, Rape, and Kick questionnaire    Fear of Current or Ex-Partner: No    Emotionally Abused: No    Physically Abused: No    Sexually Abused: No    Physical Exam      Future Appointments  Date Time Provider Department Center  04/23/2023  9:30 AM Miki Kins, FNP AMA-AMA None       Kerry Hough, Paramedic [phone] [facility] Paramedic  04/05/23

## 2023-04-05 NOTE — Telephone Encounter (Signed)
-----   Message from [facility] Katie L sent at 04/05/2023 12:21 PM EST ----- Regarding: rx HF Paramedicine Message to [facility]  Pharmacy (if applicable):  [place] [facility]    Issue/reason for call: needs potassium sent in   Medication refill? Yes   Blood pressure?   Weight gain? (How much over a week?) Weight loss? (How much over a week?)  Edema ? (Baseline or worse?)  Dyspnea?  (Baseline or worse?)    Medications are taken as prescribed?    He needs his potassium send in to pharmacy please.   Thanks,  Kerry Hough, EMT-Paramedic  [phone] 04/05/2023

## 2023-04-08 ENCOUNTER — Encounter: Payer: Self-pay | Admitting: Family

## 2023-04-08 DIAGNOSIS — L602 Onychogryphosis: Secondary | ICD-10-CM

## 2023-04-10 ENCOUNTER — Other Ambulatory Visit: Payer: 59

## 2023-04-11 ENCOUNTER — Other Ambulatory Visit: Payer: Self-pay | Admitting: Family

## 2023-04-16 ENCOUNTER — Other Ambulatory Visit: Payer: Self-pay | Admitting: Family

## 2023-04-17 ENCOUNTER — Other Ambulatory Visit: Payer: Self-pay | Admitting: Family

## 2023-04-18 ENCOUNTER — Emergency Department (HOSPITAL_COMMUNITY): Payer: 59

## 2023-04-18 ENCOUNTER — Telehealth (HOSPITAL_COMMUNITY): Payer: Self-pay

## 2023-04-18 ENCOUNTER — Inpatient Hospital Stay (HOSPITAL_COMMUNITY)
Admission: EM | Admit: 2023-04-18 | Discharge: 2023-04-25 | DRG: 193 | Disposition: A | Discharge status: Home / Self Care | Payer: 59 | Attending: Internal Medicine | Admitting: Internal Medicine

## 2023-04-18 DIAGNOSIS — E785 Hyperlipidemia, unspecified: Secondary | ICD-10-CM | POA: Diagnosis present

## 2023-04-18 DIAGNOSIS — Z5986 Financial insecurity: Secondary | ICD-10-CM

## 2023-04-18 DIAGNOSIS — Z743 Need for continuous supervision: Secondary | ICD-10-CM | POA: Diagnosis not present

## 2023-04-18 DIAGNOSIS — Z87440 Personal history of urinary (tract) infections: Secondary | ICD-10-CM

## 2023-04-18 DIAGNOSIS — J439 Emphysema, unspecified: Secondary | ICD-10-CM

## 2023-04-18 DIAGNOSIS — N179 Acute kidney failure, unspecified: Secondary | ICD-10-CM | POA: Diagnosis not present

## 2023-04-18 DIAGNOSIS — Z86711 Personal history of pulmonary embolism: Secondary | ICD-10-CM

## 2023-04-18 DIAGNOSIS — Y92009 Unspecified place in unspecified non-institutional (private) residence as the place of occurrence of the external cause: Secondary | ICD-10-CM

## 2023-04-18 DIAGNOSIS — J440 Chronic obstructive pulmonary disease with acute lower respiratory infection: Secondary | ICD-10-CM | POA: Diagnosis present

## 2023-04-18 DIAGNOSIS — I5023 Acute on chronic systolic (congestive) heart failure: Secondary | ICD-10-CM | POA: Diagnosis not present

## 2023-04-18 DIAGNOSIS — J1001 Influenza due to other identified influenza virus with the same other identified influenza virus pneumonia: Secondary | ICD-10-CM | POA: Diagnosis present

## 2023-04-18 DIAGNOSIS — J09X2 Influenza due to identified novel influenza A virus with other respiratory manifestations: Secondary | ICD-10-CM | POA: Diagnosis not present

## 2023-04-18 DIAGNOSIS — J101 Influenza due to other identified influenza virus with other respiratory manifestations: Secondary | ICD-10-CM | POA: Diagnosis present

## 2023-04-18 DIAGNOSIS — Z85118 Personal history of other malignant neoplasm of bronchus and lung: Secondary | ICD-10-CM

## 2023-04-18 DIAGNOSIS — Z8546 Personal history of malignant neoplasm of prostate: Secondary | ICD-10-CM

## 2023-04-18 DIAGNOSIS — I2781 Cor pulmonale (chronic): Secondary | ICD-10-CM | POA: Diagnosis present

## 2023-04-18 DIAGNOSIS — Z66 Do not resuscitate: Secondary | ICD-10-CM | POA: Diagnosis present

## 2023-04-18 DIAGNOSIS — Z5941 Food insecurity: Secondary | ICD-10-CM

## 2023-04-18 DIAGNOSIS — R9431 Abnormal electrocardiogram [ECG] [EKG]: Secondary | ICD-10-CM | POA: Insufficient documentation

## 2023-04-18 DIAGNOSIS — E873 Alkalosis: Secondary | ICD-10-CM | POA: Diagnosis present

## 2023-04-18 DIAGNOSIS — I739 Peripheral vascular disease, unspecified: Secondary | ICD-10-CM | POA: Diagnosis present

## 2023-04-18 DIAGNOSIS — I2510 Atherosclerotic heart disease of native coronary artery without angina pectoris: Secondary | ICD-10-CM | POA: Diagnosis present

## 2023-04-18 DIAGNOSIS — Z87891 Personal history of nicotine dependence: Secondary | ICD-10-CM

## 2023-04-18 DIAGNOSIS — Z8249 Family history of ischemic heart disease and other diseases of the circulatory system: Secondary | ICD-10-CM

## 2023-04-18 DIAGNOSIS — I272 Pulmonary hypertension, unspecified: Secondary | ICD-10-CM | POA: Diagnosis not present

## 2023-04-18 DIAGNOSIS — R0902 Hypoxemia: Secondary | ICD-10-CM | POA: Diagnosis not present

## 2023-04-18 DIAGNOSIS — J449 Chronic obstructive pulmonary disease, unspecified: Secondary | ICD-10-CM | POA: Diagnosis not present

## 2023-04-18 DIAGNOSIS — J9621 Acute and chronic respiratory failure with hypoxia: Secondary | ICD-10-CM | POA: Diagnosis not present

## 2023-04-18 DIAGNOSIS — I509 Heart failure, unspecified: Secondary | ICD-10-CM | POA: Diagnosis not present

## 2023-04-18 DIAGNOSIS — J9622 Acute and chronic respiratory failure with hypercapnia: Secondary | ICD-10-CM | POA: Diagnosis present

## 2023-04-18 DIAGNOSIS — D649 Anemia, unspecified: Secondary | ICD-10-CM | POA: Diagnosis present

## 2023-04-18 DIAGNOSIS — Z9981 Dependence on supplemental oxygen: Secondary | ICD-10-CM

## 2023-04-18 DIAGNOSIS — K219 Gastro-esophageal reflux disease without esophagitis: Secondary | ICD-10-CM | POA: Diagnosis present

## 2023-04-18 DIAGNOSIS — Z91018 Allergy to other foods: Secondary | ICD-10-CM

## 2023-04-18 DIAGNOSIS — R0689 Other abnormalities of breathing: Secondary | ICD-10-CM | POA: Diagnosis not present

## 2023-04-18 DIAGNOSIS — F329 Major depressive disorder, single episode, unspecified: Secondary | ICD-10-CM | POA: Diagnosis present

## 2023-04-18 DIAGNOSIS — I5043 Acute on chronic combined systolic (congestive) and diastolic (congestive) heart failure: Secondary | ICD-10-CM | POA: Diagnosis present

## 2023-04-18 DIAGNOSIS — W19XXXA Unspecified fall, initial encounter: Secondary | ICD-10-CM | POA: Diagnosis present

## 2023-04-18 DIAGNOSIS — Z5948 Other specified lack of adequate food: Secondary | ICD-10-CM

## 2023-04-18 DIAGNOSIS — I110 Hypertensive heart disease with heart failure: Secondary | ICD-10-CM | POA: Diagnosis present

## 2023-04-18 DIAGNOSIS — Z86718 Personal history of other venous thrombosis and embolism: Secondary | ICD-10-CM

## 2023-04-18 DIAGNOSIS — R7303 Prediabetes: Secondary | ICD-10-CM | POA: Diagnosis present

## 2023-04-18 DIAGNOSIS — F411 Generalized anxiety disorder: Secondary | ICD-10-CM | POA: Diagnosis present

## 2023-04-18 DIAGNOSIS — I2724 Chronic thromboembolic pulmonary hypertension: Secondary | ICD-10-CM | POA: Diagnosis present

## 2023-04-18 DIAGNOSIS — I5021 Acute systolic (congestive) heart failure: Secondary | ICD-10-CM | POA: Diagnosis present

## 2023-04-18 DIAGNOSIS — Z79899 Other long term (current) drug therapy: Secondary | ICD-10-CM

## 2023-04-18 DIAGNOSIS — Z91148 Patient's other noncompliance with medication regimen for other reason: Secondary | ICD-10-CM

## 2023-04-18 DIAGNOSIS — Z91128 Patient's intentional underdosing of medication regimen for other reason: Secondary | ICD-10-CM

## 2023-04-18 DIAGNOSIS — T502X5A Adverse effect of carbonic-anhydrase inhibitors, benzothiadiazides and other diuretics, initial encounter: Secondary | ICD-10-CM | POA: Diagnosis not present

## 2023-04-18 DIAGNOSIS — G2581 Restless legs syndrome: Secondary | ICD-10-CM | POA: Diagnosis present

## 2023-04-18 DIAGNOSIS — I5082 Biventricular heart failure: Secondary | ICD-10-CM | POA: Diagnosis present

## 2023-04-18 DIAGNOSIS — R0989 Other specified symptoms and signs involving the circulatory and respiratory systems: Secondary | ICD-10-CM | POA: Diagnosis not present

## 2023-04-18 DIAGNOSIS — I480 Paroxysmal atrial fibrillation: Secondary | ICD-10-CM | POA: Diagnosis present

## 2023-04-18 DIAGNOSIS — Z7901 Long term (current) use of anticoagulants: Secondary | ICD-10-CM

## 2023-04-18 DIAGNOSIS — E876 Hypokalemia: Secondary | ICD-10-CM | POA: Diagnosis not present

## 2023-04-18 DIAGNOSIS — M160 Bilateral primary osteoarthritis of hip: Secondary | ICD-10-CM | POA: Diagnosis not present

## 2023-04-18 DIAGNOSIS — J90 Pleural effusion, not elsewhere classified: Secondary | ICD-10-CM | POA: Diagnosis not present

## 2023-04-18 DIAGNOSIS — I252 Old myocardial infarction: Secondary | ICD-10-CM

## 2023-04-18 DIAGNOSIS — J9602 Acute respiratory failure with hypercapnia: Secondary | ICD-10-CM | POA: Diagnosis not present

## 2023-04-18 DIAGNOSIS — Z91012 Allergy to eggs: Secondary | ICD-10-CM

## 2023-04-18 DIAGNOSIS — Z91048 Other nonmedicinal substance allergy status: Secondary | ICD-10-CM

## 2023-04-18 DIAGNOSIS — R6889 Other general symptoms and signs: Secondary | ICD-10-CM | POA: Diagnosis not present

## 2023-04-18 DIAGNOSIS — J9601 Acute respiratory failure with hypoxia: Secondary | ICD-10-CM | POA: Diagnosis not present

## 2023-04-18 DIAGNOSIS — R54 Age-related physical debility: Secondary | ICD-10-CM | POA: Diagnosis present

## 2023-04-18 DIAGNOSIS — E8809 Other disorders of plasma-protein metabolism, not elsewhere classified: Secondary | ICD-10-CM | POA: Diagnosis present

## 2023-04-18 DIAGNOSIS — I428 Other cardiomyopathies: Secondary | ICD-10-CM | POA: Diagnosis present

## 2023-04-18 DIAGNOSIS — R0602 Shortness of breath: Secondary | ICD-10-CM | POA: Diagnosis not present

## 2023-04-18 DIAGNOSIS — M25552 Pain in left hip: Secondary | ICD-10-CM | POA: Diagnosis not present

## 2023-04-18 DIAGNOSIS — Z8042 Family history of malignant neoplasm of prostate: Secondary | ICD-10-CM

## 2023-04-18 DIAGNOSIS — Z87442 Personal history of urinary calculi: Secondary | ICD-10-CM

## 2023-04-18 DIAGNOSIS — Z85038 Personal history of other malignant neoplasm of large intestine: Secondary | ICD-10-CM

## 2023-04-18 DIAGNOSIS — Z888 Allergy status to other drugs, medicaments and biological substances status: Secondary | ICD-10-CM

## 2023-04-18 DIAGNOSIS — Z9049 Acquired absence of other specified parts of digestive tract: Secondary | ICD-10-CM

## 2023-04-18 HISTORY — DX: Influenza due to other identified influenza virus with other respiratory manifestations: J10.1

## 2023-04-18 LAB — BLOOD GAS, VENOUS
Acid-Base Excess: 1.6 mmol/L (ref 0.0–2.0)
Bicarbonate: 24.6 mmol/L (ref 20.0–28.0)
O2 Saturation: 59.6 %
Patient temperature: 36.7
pCO2, Ven: 33 mm[Hg] — ABNORMAL LOW (ref 44–60)
pH, Ven: 7.49 — ABNORMAL HIGH (ref 7.25–7.43)
pO2, Ven: 33 mm[Hg] (ref 32–45)

## 2023-04-18 LAB — COMPREHENSIVE METABOLIC PANEL
ALT: 20 U/L (ref 0–44)
AST: 31 U/L (ref 15–41)
Albumin: 2.9 g/dL — ABNORMAL LOW (ref 3.5–5.0)
Alkaline Phosphatase: 107 U/L (ref 38–126)
Anion gap: 9 (ref 5–15)
BUN: 13 mg/dL (ref 8–23)
CO2: 22 mmol/L (ref 22–32)
Calcium: 8.8 mg/dL — ABNORMAL LOW (ref 8.9–10.3)
Chloride: 105 mmol/L (ref 98–111)
Creatinine, Ser: 1.19 mg/dL (ref 0.61–1.24)
GFR, Estimated: >60 mL/min (ref >60)
Glucose, Bld: 99 mg/dL (ref 70–99)
Potassium: 4 mmol/L (ref 3.5–5.1)
Sodium: 136 mmol/L (ref 135–145)
Total Bilirubin: 1.1 mg/dL (ref 0.0–1.2)
Total Protein: 6.2 g/dL — ABNORMAL LOW (ref 6.5–8.1)

## 2023-04-18 LAB — CBC
HCT: 38.1 % — ABNORMAL LOW (ref 39.0–52.0)
Hemoglobin: 12.5 g/dL — ABNORMAL LOW (ref 13.0–17.0)
MCH: 32.5 pg (ref 26.0–34.0)
MCHC: 32.8 g/dL (ref 30.0–36.0)
MCV: 99 fL (ref 80.0–100.0)
Platelets: 199 10*3/uL (ref 150–400)
RBC: 3.85 MIL/uL — ABNORMAL LOW (ref 4.22–5.81)
RDW: 14.4 % (ref 11.5–15.5)
WBC: 4.3 10*3/uL (ref 4.0–10.5)
nRBC: 0 % (ref 0.0–0.2)

## 2023-04-18 LAB — CBC WITH DIFFERENTIAL/PLATELET
Abs Immature Granulocytes: 0.01 10*3/uL (ref 0.00–0.07)
Basophils Absolute: 0 10*3/uL (ref 0.0–0.1)
Basophils Relative: 0 %
Eosinophils Absolute: 0 10*3/uL (ref 0.0–0.5)
Eosinophils Relative: 0 %
HCT: 37.1 % — ABNORMAL LOW (ref 39.0–52.0)
Hemoglobin: 12 g/dL — ABNORMAL LOW (ref 13.0–17.0)
Immature Granulocytes: 0 %
Lymphocytes Relative: 11 %
Lymphs Abs: 0.5 10*3/uL — ABNORMAL LOW (ref 0.7–4.0)
MCH: 32.2 pg (ref 26.0–34.0)
MCHC: 32.3 g/dL (ref 30.0–36.0)
MCV: 99.5 fL (ref 80.0–100.0)
Monocytes Absolute: 0.6 10*3/uL (ref 0.1–1.0)
Monocytes Relative: 13 %
Neutro Abs: 3.3 10*3/uL (ref 1.7–7.7)
Neutrophils Relative %: 76 %
Platelets: 206 10*3/uL (ref 150–400)
RBC: 3.73 MIL/uL — ABNORMAL LOW (ref 4.22–5.81)
RDW: 14.4 % (ref 11.5–15.5)
WBC: 4.3 10*3/uL (ref 4.0–10.5)
nRBC: 0 % (ref 0.0–0.2)

## 2023-04-18 LAB — RESP PANEL BY RT-PCR (RSV, FLU A&B, COVID)  RVPGX2
Influenza A by PCR: POSITIVE — AB
Influenza B by PCR: NEGATIVE
Resp Syncytial Virus by PCR: NEGATIVE
SARS Coronavirus 2 by RT PCR: NEGATIVE

## 2023-04-18 LAB — CREATININE, SERUM
Creatinine, Ser: 1.13 mg/dL (ref 0.61–1.24)
GFR, Estimated: >60 mL/min (ref >60)

## 2023-04-18 LAB — BRAIN NATRIURETIC PEPTIDE: B Natriuretic Peptide: 1456 pg/mL — ABNORMAL HIGH (ref 0.0–100.0)

## 2023-04-18 LAB — TROPONIN I (HIGH SENSITIVITY)
Troponin I (High Sensitivity): 58 ng/L — ABNORMAL HIGH (ref <18)
Troponin I (High Sensitivity): 53 ng/L — ABNORMAL HIGH (ref <18)

## 2023-04-18 LAB — DIGOXIN LEVEL: Digoxin Level: <0.2 ng/mL — ABNORMAL LOW (ref 0.8–2.0)

## 2023-04-18 LAB — MAGNESIUM: Magnesium: 2 mg/dL (ref 1.7–2.4)

## 2023-04-18 MED ORDER — OSELTAMIVIR PHOSPHATE 75 MG PO CAPS
75.0000 mg | ORAL_CAPSULE | Freq: Once | ORAL | Status: AC
  Administered 2023-04-18: 75 mg via ORAL
  Filled 2023-04-18: qty 1

## 2023-04-18 MED ORDER — ALPRAZOLAM 0.5 MG PO TABS
0.5000 mg | ORAL_TABLET | Freq: Two times a day (BID) | ORAL | Status: DC | PRN
  Administered 2023-04-18 – 2023-04-23 (×3): 0.5 mg via ORAL
  Filled 2023-04-18: qty 2
  Filled 2023-04-18 (×2): qty 1

## 2023-04-18 MED ORDER — AMIODARONE HCL 200 MG PO TABS: 100.0000 mg | ORAL_TABLET | Freq: Every day | ORAL | Status: DC

## 2023-04-18 MED ORDER — VITAMIN D 25 MCG (1000 UNIT) PO TABS
1000.0000 [IU] | ORAL_TABLET | Freq: Every day | ORAL | Status: DC
  Administered 2023-04-19 – 2023-04-25 (×7): 1000 [IU] via ORAL
  Filled 2023-04-18 (×7): qty 1

## 2023-04-18 MED ORDER — FUROSEMIDE 10 MG/ML IJ SOLN
40.0000 mg | Freq: Once | INTRAMUSCULAR | Status: AC
  Administered 2023-04-18: 40 mg via INTRAVENOUS
  Filled 2023-04-18: qty 4

## 2023-04-18 MED ORDER — ROSUVASTATIN CALCIUM 5 MG PO TABS
10.0000 mg | ORAL_TABLET | Freq: Every day | ORAL | Status: DC
  Administered 2023-04-19 – 2023-04-23 (×5): 10 mg via ORAL
  Filled 2023-04-18 (×5): qty 2

## 2023-04-18 MED ORDER — LOSARTAN POTASSIUM 25 MG PO TABS
12.5000 mg | ORAL_TABLET | Freq: Every day | ORAL | Status: DC
  Administered 2023-04-18: 12.5 mg via ORAL
  Filled 2023-04-18: qty 0.5

## 2023-04-18 MED ORDER — DIGOXIN 125 MCG PO TABS
0.1250 mg | ORAL_TABLET | Freq: Every day | ORAL | Status: DC
  Administered 2023-04-18 – 2023-04-25 (×8): 0.125 mg via ORAL
  Filled 2023-04-18 (×8): qty 1

## 2023-04-18 MED ORDER — OSELTAMIVIR PHOSPHATE 75 MG PO CAPS
75.0000 mg | ORAL_CAPSULE | Freq: Two times a day (BID) | ORAL | Status: DC
  Administered 2023-04-19 – 2023-04-20 (×3): 75 mg via ORAL
  Filled 2023-04-18 (×4): qty 1

## 2023-04-18 MED ORDER — UMECLIDINIUM BROMIDE 62.5 MCG/ACT IN AEPB
1.0000 | INHALATION_SPRAY | Freq: Every day | RESPIRATORY_TRACT | Status: DC
  Administered 2023-04-20: 1 via RESPIRATORY_TRACT
  Filled 2023-04-18 (×2): qty 7

## 2023-04-18 MED ORDER — FUROSEMIDE 10 MG/ML IJ SOLN
40.0000 mg | Freq: Two times a day (BID) | INTRAMUSCULAR | Status: DC
  Administered 2023-04-19: 40 mg via INTRAVENOUS
  Filled 2023-04-18: qty 4

## 2023-04-18 MED ORDER — FUROSEMIDE 10 MG/ML IJ SOLN
20.0000 mg | Freq: Once | INTRAMUSCULAR | Status: AC
  Administered 2023-04-18: 20 mg via INTRAVENOUS
  Filled 2023-04-18: qty 2

## 2023-04-18 MED ORDER — ROPINIROLE HCL 1 MG PO TABS
1.0000 mg | ORAL_TABLET | Freq: Every day | ORAL | Status: DC
  Administered 2023-04-18 – 2023-04-24 (×7): 1 mg via ORAL
  Filled 2023-04-18 (×7): qty 1

## 2023-04-18 MED ORDER — FLUTICASONE PROPIONATE 50 MCG/ACT NA SUSP
1.0000 | Freq: Every day | NASAL | Status: DC
  Administered 2023-04-19 – 2023-04-25 (×6): 1 via NASAL
  Filled 2023-04-18 (×2): qty 16

## 2023-04-18 MED ORDER — ADULT MULTIVITAMIN W/MINERALS CH
1.0000 | ORAL_TABLET | Freq: Every day | ORAL | Status: DC
  Administered 2023-04-19 – 2023-04-25 (×7): 1 via ORAL
  Filled 2023-04-18 (×7): qty 1

## 2023-04-18 MED ORDER — GABAPENTIN 100 MG PO CAPS
100.0000 mg | ORAL_CAPSULE | Freq: Three times a day (TID) | ORAL | Status: DC
  Administered 2023-04-18 – 2023-04-25 (×19): 100 mg via ORAL
  Filled 2023-04-18 (×19): qty 1

## 2023-04-18 MED ORDER — DAPAGLIFLOZIN PROPANEDIOL 10 MG PO TABS
10.0000 mg | ORAL_TABLET | Freq: Every day | ORAL | Status: DC
  Administered 2023-04-19 – 2023-04-25 (×7): 10 mg via ORAL
  Filled 2023-04-18 (×7): qty 1

## 2023-04-18 MED ORDER — ARFORMOTEROL TARTRATE 15 MCG/2ML IN NEBU
15.0000 ug | INHALATION_SOLUTION | Freq: Two times a day (BID) | RESPIRATORY_TRACT | Status: DC
  Administered 2023-04-18 – 2023-04-20 (×3): 15 ug via RESPIRATORY_TRACT
  Filled 2023-04-18 (×2): qty 2

## 2023-04-18 MED ORDER — DIGOXIN 125 MCG PO TABS: 0.1250 mg | ORAL_TABLET | Freq: Every day | ORAL | Status: DC

## 2023-04-18 MED ORDER — SILDENAFIL CITRATE 20 MG PO TABS
20.0000 mg | ORAL_TABLET | Freq: Three times a day (TID) | ORAL | Status: DC
  Administered 2023-04-18 – 2023-04-20 (×4): 20 mg via ORAL
  Filled 2023-04-18 (×6): qty 1

## 2023-04-18 MED ORDER — PANTOPRAZOLE SODIUM 40 MG PO TBEC
40.0000 mg | DELAYED_RELEASE_TABLET | Freq: Every day | ORAL | Status: DC
  Administered 2023-04-18 – 2023-04-25 (×8): 40 mg via ORAL
  Filled 2023-04-18 (×8): qty 1

## 2023-04-18 MED ORDER — OMEGA-3-ACID ETHYL ESTERS 1 G PO CAPS
1.0000 g | ORAL_CAPSULE | Freq: Every day | ORAL | Status: DC
  Administered 2023-04-19 – 2023-04-25 (×7): 1 g via ORAL
  Filled 2023-04-18 (×8): qty 1

## 2023-04-18 MED ORDER — HEPARIN SODIUM (PORCINE) 5000 UNIT/ML IJ SOLN: 5000.0000 [IU] | Freq: Three times a day (TID) | INTRAMUSCULAR | Status: DC

## 2023-04-18 MED ORDER — ALBUTEROL SULFATE (2.5 MG/3ML) 0.083% IN NEBU: 2.5000 mg | INHALATION_SOLUTION | Freq: Four times a day (QID) | RESPIRATORY_TRACT | Status: DC | PRN

## 2023-04-18 MED ORDER — APIXABAN 5 MG PO TABS
5.0000 mg | ORAL_TABLET | Freq: Two times a day (BID) | ORAL | Status: DC
Start: 2023-04-18 — End: 2023-04-25
  Administered 2023-04-18 – 2023-04-25 (×14): 5 mg via ORAL
  Filled 2023-04-18 (×14): qty 1

## 2023-04-18 MED ORDER — AMIODARONE HCL 100 MG PO TABS
100.0000 mg | ORAL_TABLET | Freq: Every day | ORAL | Status: DC
  Administered 2023-04-18 – 2023-04-25 (×8): 100 mg via ORAL
  Filled 2023-04-18 (×8): qty 1

## 2023-04-18 MED ORDER — ALBUTEROL SULFATE HFA 108 (90 BASE) MCG/ACT IN AERS: 2.0000 | INHALATION_SPRAY | Freq: Four times a day (QID) | RESPIRATORY_TRACT | Status: DC | PRN

## 2023-04-18 MED ORDER — RANOLAZINE ER 500 MG PO TB12
500.0000 mg | ORAL_TABLET | Freq: Two times a day (BID) | ORAL | Status: DC
  Administered 2023-04-18 – 2023-04-25 (×14): 500 mg via ORAL
  Filled 2023-04-18 (×14): qty 1

## 2023-04-18 NOTE — Hospital Course (Addendum)
You came to the hospital for shortness of breath and you were diagnosed with a heart failure exacerbation and the flu.  We treated you with IV lasix and tamiflu.    *For your Heart Failure *Pulmonary hypertension  -We have stopped the following medications:  -Losartan, please do not take any more losartan   -Potassium Chloride   -Please see your cardiologist, heart failure doctor, and lung doctor within the next few weeks -Please see your PCP within the next 7-10 days    If you have any questions or concerns please feel free to call: Internal medicine clinic at [phone]   If you have any of these following symptoms, please call us or seek care at an emergency department: -Chest Pain -Difficulty Breathing -Syncope (passing out) -Drooping of face -Slurred speech -Sudden weakness in your leg or arm -Fever -Chills   We are glad that you are feeling better, it was a pleasure to care for you!  Faith Rogue DO   ______________________________________    Currently on 6L [place] Better now than arrival. If he moves more slowly not in a hurry he does okay, sometimes he gets very dizzy if he moves too fast Orthostatics not done from what I can see Says his hip pain is just from falling on sciatica 4 times Brings up bent concentrator being thrown away and not replaced again--investigate? Only has tank right now   Prediabetes:  A1C 5.7 on prior check. Will monitor glucose on daily chemistry panel  Elevated Troponin Patient presented with an initial troponin of 58 that down trended to 53, another troponin was collected and it was 69. Patient appears well and denies chest pain. I suspect that this is due to demand ischemia. -Follow repeat troponin -EKG this morning for prolonged Qtc, will evaluate for ST segment changes as well

## 2023-04-18 NOTE — Consult Note (Signed)
Cardiology Consultation   Patient ID: Bruce [name] MRN: [id number]; DOB: Nov 15, 1945  Admit date: 04/18/2023 Date of Consult: 04/18/2023  PCP:  Miki Kins, FNP   [facility] Providers Cardiologist:  Bensimhon (Followed in Advanced Heart Failure Clinic)    History of Present Illness:   Bruce [name] is a 78 yo male with history of COPD, tobacco abuse, CAD, HFrEF, lung cancer, colon cancer, prostate cancer, prior DVT/PE who is presenting to the ED today with c/o dyspnea. He is influenza A positive and has evidence of volume overload. O2 sats in the 70s on arrival to the ED.   He is followed as an outpatient in our Advanced Heart Failure clinic. He was admitted in February 2024 with dyspnea and found to have a submassive PE (treated with thrombectomy) and DVT. LVEF was 20-25% during that admission. Severe RV dysfunction. Cardiac cath with minimal non-obstructive CAD. Severe PAH with evidence of cor pulmonale. He did not tolerate Entresto due to hypotension and was changed to Losartan. He did not tolerate spironolactone secondary to hyperkalemia and this was stopped. Dr. Gala Romney saw him in April 2024 and point of care echo showed LVEF=40%. Severe RV dysfunction. Suspicion for severe underlying PAH/cor pulmonale due to end stage COPD. CT chest may 2024 with severe COPD with evidence of residual chronic thrombus. He was seen in the Advanced Heart Failure clinic in November 2024 and reported dyspnea with minimal exertion and at that time was wearing 3L O2. Weight at home was reported at 157 lbs. He continued to smoke 4-5 cigarettes per day. Echo November 2024 with LVEF=40% with severe RV dysfunction. He remained on Eliquis, amiodarone, digoxin, Farxiga, Losartan, Crestor, Ranexa and Revatio. He is not on Lasix at home.   He tells me today that he has had progressive dyspnea for months but decided to come into the ED today after a fall. He felt weak and dizzy in his bathroom and fell  but did not lose consciousness. He has had worsened LE edema for the past few days. Per records, he has not been taking his medications as prescribed at home. The primary team spoke with the nurse who fills his pillbox at home and he has not been compliant with his medication per the nurse.   In the ED, BNP is 1456. Troponin is 58. Influenza A positive. Creatinine 1.19. Chest x-ray with pulmonary edema, small right pleural effusion.    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 01/01/2015   Complication of anesthesia    wake up with a head ache   COPD (chronic obstructive pulmonary disease) (HCC)    Coronary artery disease    Edema 08/16/2010   GERD (gastroesophageal reflux disease)    Headache    related to sinus congestion   History of blood transfusion    History of kidney stones    History of MI (myocardial infarction) 10/08/2013   Hypertension    Hypokalemia 04/09/2022   Intracranial bleed (HCC) 12/31/2018   Lactic acidosis  04/09/2022   Leg cramps 08/16/2010   Malignant neoplasm of prostate (HCC) 07/13/2016   Multiple closed facial bone fractures (HCC)    Myocardial infarction [facility])    '09 AND '12   Peripheral vascular disease (HCC)    Shortness of breath    With exertion .   Tibia/fibula fracture 10/06/2013   Tibial plateau fracture 12/29/2014   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HCC) 10/08/2013   UTI (urinary tract infection)    frequent UTI   Visual hallucination 03/18/2015    Past Surgical History:  Procedure Laterality Date   CARDIAC CATHETERIZATION      CHOLECYSTECTOMY     EXTERNAL FIXATION LEG Right 10/06/2013   Procedure: CLOSED REDUCTION RIGHT TIBIAL PLATEAU FRACTURE, EXTERNAL FIXATION RIGHT LEG, PLACEMENT OF WOUND VAC;  Surgeon: Budd Palmer, MD;  Location: [facility] OR;  Service: Orthopedics;  Laterality: Right;   FEMORAL-POPLITEAL BYPASS GRAFT Right 10/06/2013   Procedure: RIGHT POPLITEAL-POPLITEAL ARTERY BYPASS GRAFT;  Surgeon: Larina Earthly, MD;  Location: [facility] OR;  Service: Vascular;  Laterality: Right;   FRACTURE SURGERY Right 2014   HARDWARE REMOVAL Right 12/02/2013   Procedure: REMOVAL EXTERNAL FIXATION RIGHT LEG ;  Surgeon: Budd Palmer, MD;  Location: [facility] OR;  Service: Orthopedics;  Laterality: Right;   HERNIA REPAIR Right 1990's   I & D EXTREMITY Right 10/09/2013   Procedure: IRRIGATION AND DEBRIDEMENT RIGHT LEG, CLOSURE  OF WOUNDS, PLACEMENT OF WOUND VAC ON EACH SIDE OF LEG;  Surgeon: Budd Palmer, MD;  Location: [facility] OR;  Service: Orthopedics;  Laterality: Right;   IR ANGIOGRAM PULMONARY RIGHT SELECTIVE  04/10/2022   IR ANGIOGRAM SELECTIVE EACH ADDITIONAL VESSEL  04/10/2022   IR NEPHROSTOMY PLACEMENT LEFT  06/19/2016   IR THROMBECT PRIM MECH INIT (INCLU) MOD SED  04/10/2022   IR US GUIDE VASC ACCESS RIGHT  04/10/2022   IVC filter  2009   placed @ [facility]/ Removed in 2010.   IVC Filter Removed     LEFT HEART CATH AND CORONARY ANGIOGRAPHY Right 10/11/2018   Procedure: LEFT HEART CATH AND CORONARY ANGIOGRAPHY;  Surgeon: Laurier Nancy, MD;  Location: [facility] INVASIVE CV LAB;  Service: Cardiovascular;  Laterality: Right;   ligament leg Left    NEPHROLITHOTOMY Left 06/19/2016   Procedure: NEPHROLITHOTOMY PERCUTANEOUS;  Surgeon: Vanna Scotland, MD;  Location: [facility] ORS;  Service: Urology;  Laterality: Left;   ORIF TIBIA FRACTURE Right 12/29/2014   Procedure: OPEN REDUCTION INTERNAL FIXATION (ORIF) RIGHT TIBIA FRACTURE, RIA VS ICBG;  Surgeon: Myrene Galas, MD;  Location: [facility] OR;  Service: Orthopedics;  Laterality: Right;   RADIOACTIVE SEED IMPLANT N/A  09/12/2016   Procedure: RADIOACTIVE SEED IMPLANT/BRACHYTHERAPY IMPLANT;  Surgeon: Vanna Scotland, MD;  Location: [facility] ORS;  Service: Urology;  Laterality: N/A;   RIGHT/LEFT HEART CATH AND CORONARY ANGIOGRAPHY N/A 04/13/2022   Procedure: RIGHT/LEFT HEART CATH AND CORONARY ANGIOGRAPHY;  Surgeon: Dolores Patty, MD;  Location: [facility] INVASIVE CV LAB;  Service: Cardiovascular;  Laterality: N/A;     Home Medications:  Prior to Admission medications   Medication Sig Start Date End Date Taking? Authorizing Provider  amiodarone (PACERONE) 200 MG tablet Take 0.5 tablets (100 mg total) by mouth daily. Patient taking differently: Take 100 mg by mouth in the morning. 08/25/22  Yes Bensimhon, Bevelyn Buckles, MD  apixaban (ELIQUIS) 5 MG TABS tablet TAKE ONE TABLET (5 MG TOTAL) BY MOUTH TWO (TWO) TIMES DAILY. Patient taking differently: Take 5 mg by mouth 2 (two) times daily. 09/26/22  Yes Bensimhon, Bevelyn Buckles,  MD  baclofen (LIORESAL) 10 MG tablet TAKE ONE TABLET (10 MG TOTAL) BY MOUTH AT BEDTIME AS NEEDED FOR MUSCLE SPASMS. Patient taking differently: Take 10 mg by mouth at bedtime. 03/20/23  Yes Miki Kins, FNP  cetirizine (ZYRTEC) 10 MG tablet Take 1 tablet (10 mg total) by mouth daily. Patient taking differently: Take 10 mg by mouth in the morning. 05/12/22  Yes [name], NP  cholecalciferol (VITAMIN D3) 25 MCG (1000 UNIT) tablet Take 1,000 Units by mouth in the morning.   Yes [provider]  digoxin (LANOXIN) 0.125 MG tablet TAKE ONE TABLET (0.125 MG TOTAL) BY MOUTH DAILY. Patient taking differently: Take 0.125 mg by mouth in the morning. 01/11/23  Yes Bensimhon, Bevelyn Buckles, MD  FARXIGA 10 MG TABS tablet TAKE ONE TABLET (10 MG TOTAL) BY MOUTH DAILY. Patient taking differently: Take 10 mg by mouth in the morning. 12/15/22  Yes Milford, Anderson Malta, FNP  fluticasone (FLONASE) 50 MCG/ACT nasal spray Place 1 spray into both nostrils daily as needed for allergies. 03/26/23  Yes [provider]   gabapentin (NEURONTIN) 100 MG capsule Take 1 capsule (100 mg total) by mouth 3 (three) times daily. 02/19/23  Yes Miki Kins, FNP  losartan (COZAAR) 25 MG tablet TAKE 0.5 TABLETS (12.5 MG TOTAL) BY MOUTH AT BEDTIME. Patient taking differently: Take 12.5 mg by mouth at bedtime. 11/21/22  Yes Bensimhon, Bevelyn Buckles, MD  Multiple Vitamin (MULTIVITAMIN) tablet Take 1 tablet by mouth in the morning. For Men   Yes [provider]  Omega-3 Fatty Acids (FISH OIL PO) Take 1,000 mg by mouth every evening.   Yes [provider]  omeprazole (PRILOSEC) 40 MG capsule Take 40 mg by mouth 2 (two) times daily. 01/10/23  Yes [provider]  pantoprazole (PROTONIX) 40 MG tablet Take 1 tablet (40 mg total) by mouth daily. Patient taking differently: Take 40 mg by mouth in the morning. 01/10/23  Yes Miki Kins, FNP  potassium chloride 20 MEQ TBCR Take 2 tablets (40 mEq total) by mouth daily. 04/05/23  Yes Bensimhon, Bevelyn Buckles, MD  ranolazine (RANEXA) 500 MG 12 hr tablet TAKE ONE TABLET (500 MG TOTAL) BY MOUTH TWO (TWO) TIMES DAILY. Patient taking differently: Take 500 mg by mouth 2 (two) times daily. 01/11/23  Yes Bensimhon, Bevelyn Buckles, MD  rOPINIRole (REQUIP) 1 MG tablet TAKE ONE TABLET (1 MG TOTAL) BY MOUTH IN THE MORNING AND AT BEDTIME. Patient taking differently: Take 1 mg by mouth 2 (two) times daily. 01/15/23  Yes Scoggins, Hospital doctor, NP  rosuvastatin (CRESTOR) 10 MG tablet TAKE ONE TABLET (10 MG TOTAL) BY MOUTH DAILY. Patient taking differently: Take 10 mg by mouth every evening. 11/21/22  Yes Bensimhon, Bevelyn Buckles, MD  sildenafil (REVATIO) 20 MG tablet Take 1 tablet (20 mg total) by mouth 3 (three) times daily. 01/15/23  Yes Milford, Anderson Malta, FNP  Tiotropium Bromide-Olodaterol (STIOLTO RESPIMAT) 2.5-2.5 MCG/ACT AERS Inhale 2 puffs into the lungs daily. 07/20/22 07/20/23 Yes Dgayli, Lianne Bushy, MD  albuterol (VENTOLIN HFA) 108 (90 Base) MCG/ACT inhaler TAKE TWO PUFFS BY MOUTH AS NEEDED FOR  WHEEZING EVERY 4-6 HOURS Patient not taking: Reported on 04/18/2023 11/10/22   Laurier Nancy, MD  ALPRAZolam Prudy Feeler) 0.5 MG tablet TAKE ONE TABLET BY MOUTH THREE TIMES A DAY AS NEEDED FOR SLEEP OR ANXIETY Patient not taking: Reported on 04/18/2023 03/16/23   Miki Kins, FNP  lidocaine (LIDODERM) 5 % Place 1 patch onto the skin daily. Remove & Discard patch  within 12 hours or as directed by MD Patient not taking: Reported on 04/18/2023 05/12/22   [name], NP  nitroGLYCERIN (NITROSTAT) 0.4 MG SL tablet Place 0.4 mg under the tongue every 5 (five) minutes as needed for chest pain. Patient not taking: Reported on 04/18/2023    [provider]    Inpatient Medications: Scheduled Meds:  apixaban  5 mg Oral BID   arformoterol  15 mcg Nebulization BID   And   umeclidinium bromide  1 puff Inhalation Daily   cholecalciferol  1,000 Units Oral Daily   dapagliflozin propanediol  10 mg Oral Daily   fluticasone  1 spray Each Nare Daily   furosemide  40 mg Intravenous Once   gabapentin  100 mg Oral TID   heparin  5,000 Units Subcutaneous Q8H   losartan  12.5 mg Oral QHS   multivitamin  1 tablet Oral Daily   omega-3 acid ethyl esters  1 g Oral Daily   [START ON 04/19/2023] oseltamivir  75 mg Oral BID   pantoprazole  40 mg Oral Daily   ranolazine  500 mg Oral BID   rOPINIRole  1 mg Oral QHS   rosuvastatin  10 mg Oral Daily   sildenafil  20 mg Oral TID   Continuous Infusions:  PRN Meds: albuterol, ALPRAZolam  Allergies:    Allergies  Allergen Reactions   Adhesive [Tape] Other (See Comments)    After right leg fracture surgery, pt developed a large blister where tape was applied to his right leg. OK to use paper tape.   Eggs-Apples-Oats [Alitraq] Other (See Comments)    Caused chest pains   Imdur [Isosorbide Dinitrate] Other (See Comments)    hallucinations   Singulair [Montelukast Sodium] Other (See Comments)    Hallucinations     Social History:   Social History    Socioeconomic History   Marital status: Single    Spouse name: Not on file   Number of children: 2   Years of education: Not on file   Highest education level: 6th grade  Occupational History   Not on file  Tobacco Use   Smoking status: Former    Current packs/day: 0.00    Average packs/day: 0.5 packs/day for 52.0 years (26.0 ttl pk-yrs)    Types: Cigarettes    Start date: 07/05/1970    Quit date: 07/05/2022    Years since quitting: 0.7   Smokeless tobacco: Never  Vaping Use   Vaping status: Never Used  Substance and Sexual Activity   Alcohol use: No    Alcohol/week: 0.0 standard drinks of alcohol    Comment: Stopped 2009   Drug use: No   Sexual activity: Not Currently  Other Topics Concern   Not on file  Social History Narrative   ** Merged History Encounter **       Social Drivers of Health   Financial Resource Strain: High Risk (04/12/2022)   Overall Financial Resource Strain (CARDIA)    Difficulty of Paying Living Expenses: Hard  Food Insecurity: No Food Insecurity (02/08/2023)   Hunger Vital Sign    Worried About Running Out of Food in the Last Year: Never true    Ran Out of Food in the Last Year: Never true  Transportation Needs: No Transportation Needs (05/23/2022)   PRAPARE - Administrator, Civil Service (Medical): No    Lack of Transportation (Non-Medical): No  Physical Activity: Inactive (02/05/2017)   Exercise Vital Sign    Days of Exercise  per Week: 0 days    Minutes of Exercise per Session: 0 min  Stress: Stress Concern Present (02/05/2017)   [employer] of Occupational Health - Occupational Stress Questionnaire    Feeling of Stress : Rather much  Social Connections: Moderately Isolated (02/05/2017)   Social Connection and Isolation Panel [NHANES]    Frequency of Communication with Friends and Family: More than three times a week    Frequency of Social Gatherings with Friends and Family: Once a week    Attends Religious Services: Never     Database administrator or Organizations: No    Attends Banker Meetings: Never    Marital Status: Divorced  Catering manager Violence: Not At Risk (04/14/2022)   Humiliation, Afraid, Rape, and Kick questionnaire    Fear of Current or Ex-Partner: No    Emotionally Abused: No    Physically Abused: No    Sexually Abused: No    Family History:   Family History  Problem Relation Age of Onset   Hypertension Mother    Prostate cancer Brother    Mental illness Neg Hx      ROS:  Please see the history of present illness.  All other ROS reviewed and negative.     Physical Exam/Data:   Vitals:   04/18/23 1500 04/18/23 1552 04/18/23 1615 04/18/23 1736  BP: (!) 136/100  (!) 145/92 (!) 140/90  Pulse: 84  85 84  Resp: (!) 27  (!) 28 (!) 37  Temp:  (!) 97.4 F (36.3 C)    TempSrc:  Oral    SpO2: 98%  97% (!) 87%    Intake/Output Summary (Last 24 hours) at 04/18/2023 1748 Last data filed at 04/18/2023 1736 Gross per 24 hour  Intake 240 ml  Output 1000 ml  Net -760 ml      03/20/2023    4:00 PM 03/06/2023    3:26 PM 02/22/2023   10:41 AM  Last 3 Weights  Weight (lbs) 166 lb 153 lb 3.2 oz 154 lb 3.2 oz  Weight (kg) 75.297 kg 69.491 kg 69.945 kg     There is no height or weight on file to calculate BMI.  General:  Thin male, chronically ill appearing  HEENT: normal Neck: + JVD Cardiac:  normal S1, S2; RRR; no murmur  Lungs:  Coarse BS and crackles throughout both lungs Abd: soft, nontender, no hepatomegaly  Ext: 3+ bilateral LE edema Skin: warm and dry  Neuro:  CNs 2-12 intact, no focal abnormalities noted Psych:  Normal affect   EKG:  The EKG was personally reviewed and demonstrates:  sinus, no ischemic changes Telemetry:  Telemetry was personally reviewed and demonstrates:  sinus  Relevant CV Studies:   Laboratory Data:  High Sensitivity Troponin:   Recent Labs  Lab 04/18/23 1139  TROPONINIHS 58*     Chemistry Recent Labs  Lab 04/18/23 1139  NA  136  K 4.0  CL 105  CO2 22  GLUCOSE 99  BUN 13  CREATININE 1.19  CALCIUM 8.8*  GFRNONAA >60  ANIONGAP 9    Recent Labs  Lab 04/18/23 1139  PROT 6.2*  ALBUMIN 2.9*  AST 31  ALT 20  ALKPHOS 107  BILITOT 1.1   Lipids No results for input(s): "CHOL", "TRIG", "HDL", "LABVLDL", "LDLCALC", "CHOLHDL" in the last 168 hours.  Hematology Recent Labs  Lab 04/18/23 1139  WBC 4.3  RBC 3.73*  HGB 12.0*  HCT 37.1*  MCV 99.5  MCH 32.2  MCHC  32.3  RDW 14.4  PLT 206   Thyroid No results for input(s): "TSH", "FREET4" in the last 168 hours.  BNP Recent Labs  Lab 04/18/23 1139  BNP 1,456.0*    DDimer No results for input(s): "DDIMER" in the last 168 hours.   Radiology/Studies:  [facility] Chest Port 1 View Result Date: 04/18/2023 CLINICAL DATA:  Fall with shortness of breath. EXAM: PORTABLE CHEST 1 VIEW COMPARISON:  Chest radiograph dated Jul 24, 2022. CT chest dated 10/25/2022. FINDINGS: The heart size and mediastinal contours are within normal limits. Aortic atherosclerosis. Hyperinflation. Central pulmonary vascular congestion with bilateral interstitial prominence. Peripheral linear opacities in the right mid and lower lung zones could reflect areas of scarring. Small right pleural effusion. No pneumothorax. No acute osseous abnormality. IMPRESSION: 1. Central pulmonary vascular congestion with findings suggestive of interstitial edema. 2. Small right pleural effusion. 3. Emphysema. Peripheral linear opacities in the right mid and lower lung zones could reflect areas of scarring. Electronically Signed   By: Hart Robinsons M.D.   On: 04/18/2023 14:20     Assessment and Plan:   Acute on chronic hypoxic respiratory failure: secondary to below issues, most likely driven by volume overload this admission.   Chronic thromboembolic with severe PAH: continue Eliquis  Severe COPD on home O2: per primary team  Biventricular failure with cor pulmonale/acute on chronic systolic CHF: He is  markedly volume overloaded. He is not on a diuretic at home. He has not been non-complaint with his home medical regimen per the nurse who assists him with his medications but he denies this.  Agree with diuresis with IV Lasix. Will change to 40 mg IV BID.  Resume home medications.  Follow on telemetry.  We will ask our Advanced Heart Failure team to see him tomorrow.   PAF: Sinus today. Continue amiodarone, digoxin and Eliquis  For questions or updates, please contact [facility] Please consult [email or web address] for contact info under    Signed, Verne Carrow, MD  04/18/2023 5:48 PM

## 2023-04-18 NOTE — ED Provider Notes (Signed)
[name] EMERGENCY DEPARTMENT AT [facility] Provider Note   CSN: [id number] Arrival date & time: 04/18/23  1125     History  No chief complaint on file.   Bruce [name] is a 78 y.o. male.  78 yo M with a chief complaint of difficulty breathing.  Has been going on since he was hospitalized about 3 months ago and diagnosed with a PE that required intervention.  He feels like the breathing has gotten a bit worse over time and now when he tries to get up and walk just a short distance has to stop and take a break.  He is on 3 L of oxygen at all times.  Was noted by EMS to have an oxygen saturation in the 70s.  Was started on nonrebreather with some improvement it was transported here for evaluation.  Patient has been coughing a bit.  Has had some sinus drainage.  He denies any fevers.  Denies any chest pain or pressure.  Denies abdominal pain.        Home Medications Prior to Admission medications   Medication Sig Start Date End Date Taking? Authorizing Provider  albuterol (VENTOLIN HFA) 108 (90 Base) MCG/ACT inhaler TAKE TWO PUFFS BY MOUTH AS NEEDED FOR WHEEZING EVERY 4-6 HOURS 11/10/22   Laurier Nancy, MD  ALPRAZolam Prudy Feeler) 0.5 MG tablet TAKE ONE TABLET BY MOUTH THREE TIMES A DAY AS NEEDED FOR SLEEP OR ANXIETY 03/16/23   Miki Kins, FNP  amiodarone (PACERONE) 200 MG tablet Take 0.5 tablets (100 mg total) by mouth daily. 08/25/22   Bensimhon, Bevelyn Buckles, MD  apixaban (ELIQUIS) 5 MG TABS tablet TAKE ONE TABLET (5 MG TOTAL) BY MOUTH TWO (TWO) TIMES DAILY. 09/26/22   Bensimhon, Bevelyn Buckles, MD  baclofen (LIORESAL) 10 MG tablet TAKE ONE TABLET (10 MG TOTAL) BY MOUTH AT BEDTIME AS NEEDED FOR MUSCLE SPASMS. 03/20/23   Miki Kins, FNP  cetirizine (ZYRTEC) 10 MG tablet Take 1 tablet (10 mg total) by mouth daily. 05/12/22   [name], NP  cholecalciferol (VITAMIN D3) 25 MCG (1000 UNIT) tablet Take 1,000 Units by mouth daily.    [provider]  digoxin (LANOXIN)  0.125 MG tablet TAKE ONE TABLET (0.125 MG TOTAL) BY MOUTH DAILY. 01/11/23   Bensimhon, Bevelyn Buckles, MD  FARXIGA 10 MG TABS tablet TAKE ONE TABLET (10 MG TOTAL) BY MOUTH DAILY. 12/15/22   Milford, Anderson Malta, FNP  gabapentin (NEURONTIN) 100 MG capsule Take 1 capsule (100 mg total) by mouth 3 (three) times daily. 02/19/23   Miki Kins, FNP  lidocaine (LIDODERM) 5 % Place 1 patch onto the skin daily. Remove & Discard patch within 12 hours or as directed by MD 05/12/22   [name], NP  losartan (COZAAR) 25 MG tablet TAKE 0.5 TABLETS (12.5 MG TOTAL) BY MOUTH AT BEDTIME. 11/21/22   Bensimhon, Bevelyn Buckles, MD  Multiple Vitamin (MULTIVITAMIN) tablet Take 1 tablet by mouth daily. For Men    [provider]  nitroGLYCERIN (NITROSTAT) 0.4 MG SL tablet Place 0.4 mg under the tongue every 5 (five) minutes as needed for chest pain.    [provider]  Omega-3 Fatty Acids (FISH OIL PO) Take 1,000 mg by mouth every evening.    [provider]  omeprazole (PRILOSEC) 40 MG capsule Take 40 mg by mouth 2 (two) times daily. 01/10/23   [provider]  pantoprazole (PROTONIX) 40 MG tablet Take 1 tablet (40 mg total) by mouth daily. 01/10/23  Miki Kins, FNP  potassium chloride 20 MEQ TBCR Take 2 tablets (40 mEq total) by mouth daily. 04/05/23   Bensimhon, Bevelyn Buckles, MD  ranolazine (RANEXA) 500 MG 12 hr tablet TAKE ONE TABLET (500 MG TOTAL) BY MOUTH TWO (TWO) TIMES DAILY. 01/11/23   Bensimhon, Bevelyn Buckles, MD  rOPINIRole (REQUIP) 1 MG tablet TAKE ONE TABLET (1 MG TOTAL) BY MOUTH IN THE MORNING AND AT BEDTIME. 01/15/23   Scoggins, Amber, NP  rosuvastatin (CRESTOR) 10 MG tablet TAKE ONE TABLET (10 MG TOTAL) BY MOUTH DAILY. 11/21/22   Bensimhon, Bevelyn Buckles, MD  sildenafil (REVATIO) 20 MG tablet Take 1 tablet (20 mg total) by mouth 3 (three) times daily. 01/15/23   Milford, Anderson Malta, FNP  Tiotropium Bromide-Olodaterol (STIOLTO RESPIMAT) 2.5-2.5 MCG/ACT AERS Inhale 2 puffs into the lungs  daily. 07/20/22 07/20/23  Raechel Chute, MD      Allergies    Adhesive [tape], Eggs-apples-oats [alitraq], Imdur [isosorbide dinitrate], and Singulair [montelukast sodium]    Review of Systems   Review of Systems  Physical Exam Updated Vital Signs BP 139/85 (BP Location: Right Arm)   Pulse 82   Temp 98.1 F (36.7 C) (Oral)   Resp (!) 24   SpO2 100%  Physical Exam Vitals and nursing note reviewed.  Constitutional:      Appearance: He is well-developed.  HENT:     Head: Normocephalic and atraumatic.  Eyes:     Pupils: Pupils are equal, round, and reactive to light.  Neck:     Vascular: No JVD.  Cardiovascular:     Rate and Rhythm: Normal rate and regular rhythm.     Heart sounds: No murmur heard.    No friction rub. No gallop.  Pulmonary:     Effort: No respiratory distress.     Breath sounds: No wheezing.     Comments: Diminished breath sounds in all fields Abdominal:     General: There is no distension.     Tenderness: There is no abdominal tenderness. There is no guarding or rebound.  Musculoskeletal:        General: Normal range of motion.     Cervical back: Normal range of motion and neck supple.     Right lower leg: Edema present.     Left lower leg: Edema present.     Comments: 3+ to the knees  Skin:    Coloration: Skin is not pale.     Findings: No rash.  Neurological:     Mental Status: He is alert and oriented to person, place, and time.  Psychiatric:        Behavior: Behavior normal.     ED Results / Procedures / Treatments   Labs (all labs ordered are listed, but only abnormal results are displayed) Labs Reviewed  RESP PANEL BY RT-PCR (RSV, FLU A&B, COVID)  RVPGX2 - Abnormal; Notable for the following components:      Result Value   Influenza A by PCR POSITIVE (*)    All other components within normal limits  CBC WITH DIFFERENTIAL/PLATELET - Abnormal; Notable for the following components:   RBC 3.73 (*)    Hemoglobin 12.0 (*)    HCT 37.1 (*)     Lymphs Abs 0.5 (*)    All other components within normal limits  COMPREHENSIVE METABOLIC PANEL - Abnormal; Notable for the following components:   Calcium 8.8 (*)    Total Protein 6.2 (*)    Albumin 2.9 (*)    All other components  within normal limits  BRAIN NATRIURETIC PEPTIDE - Abnormal; Notable for the following components:   B Natriuretic Peptide 1,456.0 (*)    All other components within normal limits  TROPONIN I (HIGH SENSITIVITY) - Abnormal; Notable for the following components:   Troponin I (High Sensitivity) 58 (*)    All other components within normal limits  DIGOXIN LEVEL  BLOOD GAS, ARTERIAL    EKG EKG Interpretation Date/Time:  Wednesday April 18 2023 11:58:15 EST Ventricular Rate:  84 PR Interval:  172 QRS Duration:  90 QT Interval:  420 QTC Calculation: 497 R Axis:   105  Text Interpretation: Sinus rhythm Probable right ventricular hypertrophy Borderline prolonged QT interval No significant change since last tracing Confirmed by Melene Plan [phone]) on 04/18/2023 12:17:49 PM  Radiology No results found.  Procedures .Critical Care  Performed by: Melene Plan, DO Authorized by: Melene Plan, DO   Critical care provider statement:    Critical care time (minutes):  35   Critical care time was exclusive of:  Separately billable procedures and treating other patients   Critical care was time spent personally by me on the following activities:  Development of treatment plan with patient or surrogate, discussions with consultants, evaluation of patient's response to treatment, examination of patient, ordering and review of laboratory studies, ordering and review of radiographic studies, ordering and performing treatments and interventions, pulse oximetry, re-evaluation of patient's condition and review of old charts   Care discussed with: admitting provider       Medications Ordered in ED Medications  furosemide (LASIX) injection 20 mg (has no administration in  time range)  oseltamivir (TAMIFLU) capsule 75 mg (has no administration in time range)    ED Course/ Medical Decision Making/ A&P                                 Medical Decision Making Amount and/or Complexity of Data Reviewed Labs: ordered. Radiology: ordered. ECG/medicine tests: ordered.  Risk Prescription drug management.   78 yo M with a chief complaint of difficulty breathing.  This has been ongoing he said since he had his pulmonary embolism about 3 months ago.  He feels like it has been getting slowly worse and then was so bad today that he ended up calling 911.  Found to have an oxygen saturation in the 70s on his chronic 3 L and was sent here for evaluation.  Patient unfortunately also has heart failure and has also had a history of an MI.  He does have some lower extremity edema which he thinks is actually getting a bit better.  Will obtain an x-ray blood work reassess.  Chest x-ray on my independent interpretation without obvious focal lung..  Patient with perhaps mild increased edema.  BNP is elevated from baseline at 1400.  Will give a bolus dose of Lasix.  No acute anemia, no significant electrolyte abnormalities.  Troponin much lower than last visit.  Will discuss with medicine.  The patients results and plan were reviewed and discussed.   Any x-rays performed were independently reviewed by myself.   Differential diagnosis were considered with the presenting HPI.  Medications  furosemide (LASIX) injection 20 mg (has no administration in time range)  oseltamivir (TAMIFLU) capsule 75 mg (has no administration in time range)    Vitals:   04/18/23 1134 04/18/23 1200  BP:  139/85  Pulse:  82  Resp:  (!) 24  Temp:  98.1 F (36.7 C)  TempSrc:  Oral  SpO2: 99% 100%    Final diagnoses:  Acute on chronic respiratory failure with hypoxia (HCC)  Influenza A  Acute on chronic systolic congestive heart failure (HCC)    Admission/ observation were discussed with  the admitting physician, patient and/or family and they are comfortable with the plan.          Final Clinical Impression(s) / ED Diagnoses Final diagnoses:  Acute on chronic respiratory failure with hypoxia (HCC)  Influenza A  Acute on chronic systolic congestive heart failure [facility])    Rx / DC Orders ED Discharge Orders     None         Melene Plan, DO 04/18/23 1406

## 2023-04-18 NOTE — ED Provider Notes (Signed)
3:19 PM Care assumed from Dr. Adela Lank.  Patient awaiting admission by internal medicine service for further management of respiratory failure with influenza.   Bruce [name], Bruce Brim, MD 04/18/23 1729

## 2023-04-18 NOTE — ED Triage Notes (Signed)
Pt BIb [facility] for fall with SOB.  Endorses left hip pain w hx.  Fire got 70% sat on 3L. 3L at baseline.  Wheezing in lower lobes per EMS.  +4 pitting edema in lower extremities. Hx of bilateral PE 3 months ago.  Prior hx of MI and CHF. Pt takes Eliquis.  130/78 HR 88  96% on NRB  18 G L. FA

## 2023-04-18 NOTE — Telephone Encounter (Signed)
Called Bruce [name] to set up home paramedicine visit for today however he expressed on the phone that he fell last night while going to the bathroom and no cannot walk or bear weight on his leg. I recommended EMS transport for further evaluation. He agreed. I contacted our dispatch to facilitate same. I will follow up once he is back home.   [name], EMT-Paramedic [phone] 04/18/2023

## 2023-04-18 NOTE — H&P (Cosign Needed Addendum)
Date: 04/18/2023               Patient Name:  Bruce [name] MRN: [id number]  DOB: [birth year] Age / Sex: 78 y.o., male   PCP: Miki Kins, FNP         Medical Service: Internal Medicine Teaching Service         Attending Physician: Dr. Dickie La, MD      First Contact: Dr. Lovie Macadamia MD Pager [phone]    Second Contact: Dr. 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  Patient reports chronic shortness of breath with chronic supplemental oxygen use of 3-4L at home. He has been feeling short of breath for months but decided to come in today after a fall. Per patient, he woke up this morning and fell in the bathroom after he felt lightheaded and short of breath. He denied LOC or hitting his head. He reports that he often feels dizzy/lightheaded when he stands to walk and limited in mobility due to exertion.   He reports an increase in lower extremity edema over the past few days. He denies missing any medication doses; however, he is not sure what medications he takes at the moment. I spoke with his RN who refills his pillbox who reported that patient has not been compliant with his regimen at home. He is not on any diuretics per RN. Patient reports orthopnea currently. He denies chest pain. He reports a chronic cough that has not changed in frequency or mucous  production.    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 NSTEMI PAF pulmonary hypertension Prostate cancer (s/p brachtherapy seeds) cor pulmonale MDD   Medications: Albuterol prn Stiolto Respimat Zyrtec 10mg  daily  Digoxin 0.125 mg daily Amiodarone 100 mg daily Eliquis 5 mg twice daily Farxiga 10 mg daily Losartan 12.5 mg QHS Potassium daily (not taking for a while) Sildenafil 20mg  TID for pulmonary HTN Omeprazole 40mg  BID  Ranexa 500mg  BID  Crestor 10mg  daily Xanax 0.5 mg 3 times daily as needed anxiety Ropinerole 1mg  at bedtime Baclofen 10mg   Vitamin D every morning  Gabapentin 100 mg TID Nitroglycerin (not using)  Prilosec 40 mg BID Protonix 40 mg daily  Crestor 10mg   Xanax 0.5 mg prn    ED Course: Brought in by EMS, oxygen saturations in the 70s.  Started on nonrebreather with improvement.  Vitals: Respiratory rate 24, otherwise within normal limits.  Lab work demonstrating normocytic anemia.  CMP with hypoalbuminemia, calcium 8.8.  Corrected calcium 9.7.  BNP elevated to near 1500.  Troponin elevated at 58. Delta Trop pending. Dig level pending. Resp Panel flu positive, started on Tamiflu. Given Lasix 20mg  IV    Past Surgical History:  Procedure Laterality Date   CARDIAC CATHETERIZATION     CHOLECYSTECTOMY     EXTERNAL FIXATION LEG Right  10/06/2013   Procedure: CLOSED REDUCTION RIGHT TIBIAL PLATEAU FRACTURE, EXTERNAL FIXATION RIGHT LEG, PLACEMENT OF WOUND VAC;  Surgeon: Budd Palmer, MD;  Location: [facility] OR;  Service: Orthopedics;  Laterality: Right;   FEMORAL-POPLITEAL BYPASS GRAFT Right 10/06/2013   Procedure: RIGHT POPLITEAL-POPLITEAL ARTERY BYPASS GRAFT;  Surgeon: Larina Earthly, MD;  Location: [facility] OR;  Service: Vascular;  Laterality: Right;   FRACTURE SURGERY Right 2014   HARDWARE REMOVAL Right 12/02/2013   Procedure: REMOVAL EXTERNAL FIXATION RIGHT  LEG ;  Surgeon: Budd Palmer, MD;  Location: [facility] OR;  Service: Orthopedics;  Laterality: Right;   HERNIA REPAIR Right 1990's   I & D EXTREMITY Right 10/09/2013   Procedure: IRRIGATION AND DEBRIDEMENT RIGHT LEG, CLOSURE  OF WOUNDS, PLACEMENT OF WOUND VAC ON EACH SIDE OF LEG;  Surgeon: Budd Palmer, MD;  Location: [facility] OR;  Service: Orthopedics;  Laterality: Right;   IR ANGIOGRAM PULMONARY RIGHT SELECTIVE  04/10/2022   IR ANGIOGRAM SELECTIVE EACH ADDITIONAL VESSEL  04/10/2022   IR NEPHROSTOMY PLACEMENT LEFT  06/19/2016   IR THROMBECT PRIM MECH INIT (INCLU) MOD SED  04/10/2022   IR US GUIDE VASC ACCESS RIGHT  04/10/2022   IVC filter  2009   placed @ [facility]/ Removed in 2010.   IVC Filter Removed     LEFT HEART CATH AND CORONARY ANGIOGRAPHY Right 10/11/2018   Procedure: LEFT HEART CATH AND CORONARY ANGIOGRAPHY;  Surgeon: Laurier Nancy, MD;  Location: [facility] INVASIVE CV LAB;  Service: Cardiovascular;  Laterality: Right;   ligament leg Left    NEPHROLITHOTOMY Left 06/19/2016   Procedure: NEPHROLITHOTOMY PERCUTANEOUS;  Surgeon: Vanna Scotland, MD;  Location: [facility] ORS;  Service: Urology;  Laterality: Left;   ORIF TIBIA FRACTURE Right 12/29/2014   Procedure: OPEN REDUCTION INTERNAL FIXATION (ORIF) RIGHT TIBIA FRACTURE, RIA VS ICBG;  Surgeon: Myrene Galas, MD;  Location: [facility] OR;  Service: Orthopedics;  Laterality: Right;   RADIOACTIVE SEED IMPLANT N/A 09/12/2016   Procedure: RADIOACTIVE SEED IMPLANT/BRACHYTHERAPY IMPLANT;  Surgeon: Vanna Scotland, MD;  Location: [facility] ORS;  Service: Urology;  Laterality: N/A;   RIGHT/LEFT HEART CATH AND CORONARY ANGIOGRAPHY N/A 04/13/2022   Procedure: RIGHT/LEFT HEART CATH AND CORONARY ANGIOGRAPHY;  Surgeon: Dolores Patty, MD;  Location: [facility] INVASIVE CV LAB;  Service: Cardiovascular;  Laterality: N/A;    Social:  Lives With:A temporary roommate at the moment  Occupation: Retired, was a welder/ heavy Arboriculturist  Level of Function:Independent ADLs and iADLs PCP: Dr.  [name], Marchelle Folks Substances:Former tobacco user with a ~124 pack year history, denies alcohol and illicit drug use   Family History: N/A  Allergies: Allergies as of 04/18/2023 - Reviewed 04/18/2023  Allergen Reaction Noted   Adhesive [tape] Other (See Comments) 06/12/2016   Eggs-apples-oats [alitraq] Other (See Comments) 01/15/2023   Imdur [isosorbide dinitrate] Other (See Comments) 10/26/2015   Singulair [montelukast sodium] Other (See Comments) 10/26/2015    Review of Systems: Please see HPI and A&P for pertinent positives and negatives.   OBJECTIVE:   Physical Exam: Blood pressure 139/85, pulse 82, temperature 98.1 F (36.7 C), temperature source Oral, resp. rate (!) 24, SpO2 100%.  Constitutional: chronically ill appearing, NAD Neck: JV pulsation present  Cardiovascular: regular rate and rhythm, no murmur, extremities are warm and dry Pulmonary/Chest: tachypnea, diffuse crackles auscultated, no rhonchi or wheezes appreciated, on 4L O2 [place] MSK: Bilateral pitting edema present to the level of the patella's bilaterally Neurological: alert & oriented x 3 Skin: warm and dry Psych: normal mood and affect  Labs:    Latest Ref Rng & Units 04/18/2023   11:39 AM 02/05/2023    9:51 AM 01/15/2023    3:38 PM  CBC  WBC 4.0 - 10.5 K/uL 4.3  6.4  6.3   Hemoglobin 13.0 - 17.0 g/dL 95.6  21.3  08.6   Hematocrit 39.0 - 52.0 % 37.1  45.7  41.5   Platelets 150 - 400 K/uL 206  284  249         Latest Ref Rng & Units 04/18/2023   11:39 AM 03/27/2023   11:22 AM 02/05/2023    9:51 AM  CMP  Glucose 70 - 99 mg/dL 99   87   BUN 8 - 23 mg/dL 13   12   Creatinine 5.78 - 1.24 mg/dL 4.69  6.29  5.28   Sodium 135 - 145 mmol/L 136   141   Potassium 3.5 - 5.1 mmol/L 4.0   4.7   Chloride 98 - 111 mmol/L 105   101   CO2 22 - 32 mmol/L 22   20   Calcium 8.9 - 10.3 mg/dL 8.8   41.3   Total Protein 6.5 - 8.1 g/dL 6.2   7.3   Total Bilirubin 0.0 - 1.2 mg/dL 1.1   0.8   Alkaline Phos 38 - 126 U/L 107    150   AST 15 - 41 U/L 31   20   ALT 0 - 44 U/L 20   19     Most recent echo on 01/15/2023.   Left ventricular ejection fraction 40% - 45% with global hypokinesis.  The interventricular septum was flattened in systole consistent with right ventricular pressure overload.  Right ventricular function was moderately reduced and the right ventricle was moderately enlarged.  Right ventricular systolic pressure estimated at 40.2 mmHg.    Imaging: Central pulmonary vascular congestion with findings suggestive of interstitial edema, small R pleural effusion   EKG: personally reviewed my interpretation sinus rhythm without ST segment changes. Prior EKG similar.   ASSESSMENT & PLAN:   Assessment & Plan by Problem: Principal Problem:   Acute decompensated heart failure (HCC) Active Problems:   PAF (paroxysmal atrial fibrillation) (HCC)   Pulmonary HTN, group IV   Prolonged Q-T interval on ECG   Influenza A   Cor pulmonale (chronic) (HCC)   Acute on chronic hypoxic respiratory failure 2/2 HFrEF exacerbation  Biventricular HFrEF Cor-Pulmonale Patient presented with acute hypoxic respiratory failure due to a HFrEF exacerbation. Patient is grossly volume overloaded with evidence on physical exam of JVP, bilateral lower extremity pitting edema, and diffuse lung crackles auscultated during the exam. CXR showed findings suggestive of interstitial edema with a BNP of 1,456. I suspect that the HFrEF exacerbation is due to medication noncompliance. Initial troponin was elevated to 58, EKG does not reveal ST segment changes. Although, he has multiple underlying diseases that are contributing to his overall respiratory status, I believe his acute hypoxic respiratory failure is primarily due to a current HFrEF exacerbation.   Plan: -Cardiology is consulted  -Patient will require careful diuresis in setting of known RHF  -Continue losartan 12.5 mg and farixga 10 mg  -VBG ordered  -On home Ranexa 500mg  BID   -Strict Ins and outs -Daily standing weights -Will start Telemetry  -Follow up repeat troponin  -s/p 20mg  IV lasix   Influenza A Influenza A positive in emergency department, likely contributing to his respiratory failure as well.  Plan: - Continue Tamiflu 75 mg BID for 5  days   Fall I suspect that patient's fall was due to orthostatic hypotension and/or hypoxia due to current acute on chronic hypoxic respiratory failure. -Will order orthostatic vitals -Will monitor for arrhthymias on telemetry   PAF In NSR on exam, on a home regimen of Digoxin, Amiodarone, Eliquis. Per digoxin level, does not appear to be complaint with this medication as well.  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 Home regiment of Albuterol and Stiolto Respimat Plan: - Sildenafil 20mg  TID  - Continue home Albuterol and Stiolto Respimat - Continue Eliquis   PAD Tobacco Abuse  Recent CT angiography on 03/27/2023 demonstrating chronic occlusion of the hypogastric artery.  Otherwise no stenosis or occlusion of the aortoiliac, femoral-popliteal, runoff arteries.  Extensive subcutaneous edema in the lower extremities.  Right atrial and ventricular enlargement.  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 Plan: - On DOAC - Eliquis BID - Crestor 10mg  Daily   Prolonged Qtc EKG reveals a Qtc of 497 -EKG repeat in the morning -Will monitor K, Mag, Ca  MDD GAD Not on SSRI. On home regimen of Xanax 0.5mg  TID PRN - Will start xanax 0.5 mg BID to avoid withdrawal   Restless Leg Syndrome - Ropinerole 1mg  at bedtime  Prediabetes:  A1C 5.7 on prior check. Will monitor glucose on daily chemistry panel   Diet: Heart  Healthy VTE: DOAC IVF: N/A Code: DNR/DNI  Prior to Admission Living Arrangement: Home, living with roommate Anticipated Discharge Location: Home Barriers to Discharge: Medical Treatment of HFrEF exacerbation   Dispo: Admit patient to Inpatient with expected length of stay greater than 2 midnights.  Signed: Faith Rogue, DO Internal Medicine Resident: PGY-1  Please contact the on call pager after 5pm and during the weekends: [phone]   04/18/2023, 5:40 PM

## 2023-04-19 ENCOUNTER — Encounter (HOSPITAL_COMMUNITY): Payer: Self-pay | Admitting: Internal Medicine

## 2023-04-19 ENCOUNTER — Other Ambulatory Visit: Payer: Self-pay

## 2023-04-19 ENCOUNTER — Inpatient Hospital Stay (HOSPITAL_COMMUNITY): Payer: 59

## 2023-04-19 DIAGNOSIS — I272 Pulmonary hypertension, unspecified: Secondary | ICD-10-CM

## 2023-04-19 DIAGNOSIS — I509 Heart failure, unspecified: Secondary | ICD-10-CM | POA: Diagnosis not present

## 2023-04-19 DIAGNOSIS — J9621 Acute and chronic respiratory failure with hypoxia: Principal | ICD-10-CM

## 2023-04-19 DIAGNOSIS — I480 Paroxysmal atrial fibrillation: Secondary | ICD-10-CM | POA: Diagnosis not present

## 2023-04-19 DIAGNOSIS — J101 Influenza due to other identified influenza virus with other respiratory manifestations: Secondary | ICD-10-CM | POA: Diagnosis not present

## 2023-04-19 DIAGNOSIS — R9431 Abnormal electrocardiogram [ECG] [EKG]: Secondary | ICD-10-CM

## 2023-04-19 LAB — COMPREHENSIVE METABOLIC PANEL
ALT: 25 U/L (ref 0–44)
AST: 36 U/L (ref 15–41)
Albumin: 3.1 g/dL — ABNORMAL LOW (ref 3.5–5.0)
Alkaline Phosphatase: 107 U/L (ref 38–126)
Anion gap: 12 (ref 5–15)
BUN: 16 mg/dL (ref 8–23)
CO2: 22 mmol/L (ref 22–32)
Calcium: 8.9 mg/dL (ref 8.9–10.3)
Chloride: 101 mmol/L (ref 98–111)
Creatinine, Ser: 1.3 mg/dL — ABNORMAL HIGH (ref 0.61–1.24)
GFR, Estimated: 57 mL/min — ABNORMAL LOW (ref >60)
Glucose, Bld: 84 mg/dL (ref 70–99)
Potassium: 3.4 mmol/L — ABNORMAL LOW (ref 3.5–5.1)
Sodium: 135 mmol/L (ref 135–145)
Total Bilirubin: 1.2 mg/dL (ref 0.0–1.2)
Total Protein: 6.8 g/dL (ref 6.5–8.1)

## 2023-04-19 LAB — TROPONIN I (HIGH SENSITIVITY)
Troponin I (High Sensitivity): 69 ng/L — ABNORMAL HIGH (ref <18)
Troponin I (High Sensitivity): 60 ng/L — ABNORMAL HIGH (ref <18)

## 2023-04-19 LAB — CBC
HCT: 39.5 % (ref 39.0–52.0)
Hemoglobin: 13 g/dL (ref 13.0–17.0)
MCH: 32.5 pg (ref 26.0–34.0)
MCHC: 32.9 g/dL (ref 30.0–36.0)
MCV: 98.8 fL (ref 80.0–100.0)
Platelets: 208 10*3/uL (ref 150–400)
RBC: 4 MIL/uL — ABNORMAL LOW (ref 4.22–5.81)
RDW: 14.1 % (ref 11.5–15.5)
WBC: 5.6 10*3/uL (ref 4.0–10.5)
nRBC: 0 % (ref 0.0–0.2)

## 2023-04-19 MED ORDER — POTASSIUM CHLORIDE CRYS ER 20 MEQ PO TBCR
40.0000 meq | EXTENDED_RELEASE_TABLET | Freq: Once | ORAL | Status: AC
  Administered 2023-04-19: 40 meq via ORAL
  Filled 2023-04-19: qty 2

## 2023-04-19 MED ORDER — FUROSEMIDE 10 MG/ML IJ SOLN
60.0000 mg | Freq: Two times a day (BID) | INTRAMUSCULAR | Status: DC
  Administered 2023-04-19 – 2023-04-20 (×2): 60 mg via INTRAVENOUS
  Filled 2023-04-19 (×2): qty 6

## 2023-04-19 MED ORDER — LOSARTAN POTASSIUM 25 MG PO TABS
12.5000 mg | ORAL_TABLET | Freq: Every day | ORAL | Status: DC
  Administered 2023-04-19: 12.5 mg via ORAL
  Filled 2023-04-19: qty 1

## 2023-04-19 NOTE — Consult Note (Signed)
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 Clifton James.   Mr Betten is a 78 y.o. 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 Missed several doses of Xarelto prior to admit. Also had a/c LE DVT. EF also found to be down to 20-25%. RV severely reduced. Cath showed minimal CAD, moderate to severe PAH with evidence of cor pulmonale (LPAP > RPAP - checked multiple times). GDMT limited by BP.    POCUS 4/24 by Dr. Gala Romney showed severe RV dysfunction and LVEF 40% (back to baseline). Suspicion was for severe underlying PAH/cor pulmonale due to end-stage COPD. PFTs and VQ ordered   VQ 5/24: Multiple peripheral perfusion defects in both lungs, indeterminate for recurrent acute pulmonary embolism.   CT Chest 5/24: Severe COPD. The component of thrombus in the right main PA has essentially resolved since the prior CTA with a small amount of anterior chronic mural thrombus remaining. Distally, occlusive chronic thrombus remains in the interlobar PA extending into RLL and RML pulmonary arteries. This does not have the appearance of acute thrombus and appears to be chronic obstructive thrombus. At the time of thrombectomy, there was clearly a component of significant chronic thrombus in the interlobar artery   Saw Dr. Aundria Rud 5/24 and sent for PFTs which were consistent with COPD and was scheduled for follow up. Did not show.  Is followed by HF, Paramedicine Team. Taking home meds about 75% of the time. Compliance is an  issue.  Presented to the [facility] by EMS for fall and shortness of breath. Fell on hip and has been unable to bear weight. In ED, was noted to by hypoxic to 70s on 3L nasal cannula (baseline). As well as 4+ pitting edema bilaterally. Vitals notable for BP 130/78, HR 88, 96% on NRB. Labs notable for BNP 1456, trop 58. EKG in SR.   Home Medications Prior to Admission medications   Medication Sig Start Date End Date Taking? Authorizing Provider  amiodarone (PACERONE) 200 MG tablet Take 0.5 tablets (100 mg total) by mouth daily. Patient taking differently: Take 100 mg by mouth in the morning. 08/25/22  Yes Bensimhon, Bevelyn Buckles, MD  apixaban (ELIQUIS) 5 MG TABS tablet TAKE ONE TABLET (5 MG TOTAL) BY MOUTH TWO (TWO) TIMES DAILY. Patient taking differently: Take 5 mg by mouth 2 (two) times daily. 09/26/22  Yes Bensimhon, Bevelyn Buckles, MD  cetirizine (ZYRTEC) 10 MG tablet Take 1 tablet (10 mg total) by mouth daily. Patient taking differently: Take 10 mg by mouth in the morning. 05/12/22  Yes [name], NP  cholecalciferol (VITAMIN D3) 25 MCG (1000 UNIT) tablet Take 1,000 Units by mouth in the morning.   Yes [provider]  digoxin (LANOXIN) 0.125 MG tablet TAKE ONE TABLET (0.125 MG TOTAL) BY MOUTH DAILY. Patient taking differently: Take 0.125 mg by mouth in the morning. 01/11/23  Yes Bensimhon, Bevelyn Buckles, MD  FARXIGA 10 MG TABS tablet TAKE ONE TABLET (10 MG TOTAL) BY MOUTH DAILY. Patient taking differently: Take 10 mg by mouth in the morning.  12/15/22  Yes Milford, Anderson Malta, FNP  fluticasone (FLONASE) 50 MCG/ACT nasal spray Place 1 spray into both nostrils daily as needed for allergies. 03/26/23  Yes [provider]  gabapentin (NEURONTIN) 100 MG capsule Take 1 capsule (100 mg total) by mouth 3 (three) times daily. 02/19/23  Yes Miki Kins, FNP  losartan (COZAAR) 25 MG tablet TAKE 0.5 TABLETS (12.5 MG TOTAL) BY MOUTH AT BEDTIME. Patient taking differently: Take 12.5 mg by mouth at  bedtime. 11/21/22  Yes Bensimhon, Bevelyn Buckles, MD  Multiple Vitamin (MULTIVITAMIN) tablet Take 1 tablet by mouth in the morning. For Men   Yes [provider]  Omega-3 Fatty Acids (FISH OIL PO) Take 1,000 mg by mouth every evening.   Yes [provider]  omeprazole (PRILOSEC) 40 MG capsule Take 40 mg by mouth 2 (two) times daily. 01/10/23  Yes [provider]  pantoprazole (PROTONIX) 40 MG tablet Take 1 tablet (40 mg total) by mouth daily. Patient taking differently: Take 40 mg by mouth in the morning. 01/10/23  Yes Miki Kins, FNP  potassium chloride 20 MEQ TBCR Take 2 tablets (40 mEq total) by mouth daily. 04/05/23  Yes Bensimhon, Bevelyn Buckles, MD  ranolazine (RANEXA) 500 MG 12 hr tablet TAKE ONE TABLET (500 MG TOTAL) BY MOUTH TWO (TWO) TIMES DAILY. Patient taking differently: Take 500 mg by mouth 2 (two) times daily. 01/11/23  Yes Bensimhon, Bevelyn Buckles, MD  rOPINIRole (REQUIP) 1 MG tablet TAKE ONE TABLET (1 MG TOTAL) BY MOUTH IN THE MORNING AND AT BEDTIME. Patient taking differently: Take 1 mg by mouth 2 (two) times daily. 01/15/23  Yes Scoggins, Hospital doctor, NP  rosuvastatin (CRESTOR) 10 MG tablet TAKE ONE TABLET (10 MG TOTAL) BY MOUTH DAILY. Patient taking differently: Take 10 mg by mouth every evening. 11/21/22  Yes Bensimhon, Bevelyn Buckles, MD  sildenafil (REVATIO) 20 MG tablet Take 1 tablet (20 mg total) by mouth 3 (three) times daily. 01/15/23  Yes Milford, Anderson Malta, FNP  Tiotropium Bromide-Olodaterol (STIOLTO RESPIMAT) 2.5-2.5 MCG/ACT AERS Inhale 2 puffs into the lungs daily. 07/20/22 07/20/23 Yes Dgayli, Lianne Bushy, MD  albuterol (VENTOLIN HFA) 108 (90 Base) MCG/ACT inhaler TAKE TWO PUFFS BY MOUTH AS NEEDED FOR WHEEZING EVERY 4-6 HOURS Patient not taking: Reported on 04/18/2023 11/10/22   Laurier Nancy, MD  ALPRAZolam Prudy Feeler) 0.5 MG tablet TAKE ONE TABLET BY MOUTH THREE TIMES A DAY AS NEEDED FOR SLEEP OR ANXIETY 04/19/23   Miki Kins, FNP  baclofen (LIORESAL) 10 MG tablet TAKE  ONE TABLET (10 MG TOTAL) BY MOUTH AT BEDTIME AS NEEDED FOR MUSCLE SPASMS. 04/19/23   Miki Kins, FNP  lidocaine (LIDODERM) 5 % Place 1 patch onto the skin daily. Remove & Discard patch within 12 hours or as directed by MD Patient not taking: Reported on 04/18/2023 05/12/22   [name], NP  nitroGLYCERIN (NITROSTAT) 0.4 MG SL tablet Place 0.4 mg under the tongue every 5 (five) minutes as needed for chest pain. 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 01/01/2015   Complication of anesthesia    wake up with a head ache   COPD (chronic obstructive pulmonary disease) (HCC)    Coronary artery disease    Edema 08/16/2010   GERD (gastroesophageal reflux disease)    Headache    related to sinus congestion   History of blood transfusion    History of kidney stones    History of MI (myocardial infarction) 10/08/2013   Hypertension    Hypokalemia 04/09/2022   Intracranial bleed (HCC) 12/31/2018   Lactic acidosis 04/09/2022   Leg cramps 08/16/2010   Malignant neoplasm of prostate (HCC) 07/13/2016   Multiple closed facial bone fractures (HCC)    Myocardial infarction [facility])    '09 AND '12   Peripheral vascular disease (HCC)    Shortness of breath    With exertion .   Tibia/fibula fracture 10/06/2013   Tibial plateau fracture 12/29/2014   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HCC) 10/08/2013   UTI (urinary tract infection)    frequent UTI   Visual hallucination 03/18/2015    Past  Surgical History: Past Surgical History:  Procedure Laterality Date   CARDIAC CATHETERIZATION     CHOLECYSTECTOMY     EXTERNAL FIXATION LEG Right 10/06/2013   Procedure: CLOSED REDUCTION RIGHT TIBIAL PLATEAU FRACTURE, EXTERNAL FIXATION RIGHT LEG, PLACEMENT OF WOUND VAC;  Surgeon: Budd Palmer, MD;  Location: [facility] OR;  Service: Orthopedics;  Laterality: Right;   FEMORAL-POPLITEAL BYPASS GRAFT Right 10/06/2013   Procedure: RIGHT POPLITEAL-POPLITEAL ARTERY BYPASS GRAFT;  Surgeon: Larina Earthly, MD;  Location: [facility] OR;  Service: Vascular;  Laterality: Right;   FRACTURE SURGERY Right 2014   HARDWARE REMOVAL Right 12/02/2013   Procedure: REMOVAL EXTERNAL FIXATION RIGHT LEG ;  Surgeon: Budd Palmer, MD;  Location: [facility] OR;  Service: Orthopedics;  Laterality: Right;   HERNIA REPAIR Right 1990's   I & D EXTREMITY Right 10/09/2013   Procedure: IRRIGATION AND DEBRIDEMENT RIGHT LEG, CLOSURE  OF WOUNDS, PLACEMENT OF WOUND VAC ON EACH SIDE OF LEG;  Surgeon: Budd Palmer, MD;  Location: [facility] OR;  Service: Orthopedics;  Laterality: Right;   IR ANGIOGRAM PULMONARY RIGHT SELECTIVE  04/10/2022   IR ANGIOGRAM SELECTIVE EACH ADDITIONAL VESSEL  04/10/2022   IR NEPHROSTOMY PLACEMENT LEFT  06/19/2016   IR THROMBECT PRIM MECH INIT (INCLU) MOD SED  04/10/2022   IR US GUIDE VASC ACCESS RIGHT  04/10/2022   IVC filter  2009   placed @ [facility]/ Removed in 2010.   IVC Filter Removed     LEFT HEART CATH AND CORONARY ANGIOGRAPHY Right 10/11/2018   Procedure: LEFT HEART CATH AND CORONARY ANGIOGRAPHY;  Surgeon: Laurier Nancy, MD;  Location: [facility] INVASIVE CV LAB;  Service: Cardiovascular;  Laterality: Right;   ligament leg Left    NEPHROLITHOTOMY Left 06/19/2016   Procedure: NEPHROLITHOTOMY PERCUTANEOUS;  Surgeon: Vanna Scotland, MD;  Location: [facility] ORS;  Service: Urology;  Laterality: Left;   ORIF TIBIA FRACTURE Right 12/29/2014   Procedure: OPEN REDUCTION INTERNAL FIXATION (ORIF) RIGHT TIBIA FRACTURE, RIA VS ICBG;  Surgeon: Myrene Galas,  MD;  Location: [facility] OR;  Service: Orthopedics;  Laterality: Right;   RADIOACTIVE SEED IMPLANT N/A 09/12/2016   Procedure: RADIOACTIVE SEED IMPLANT/BRACHYTHERAPY IMPLANT;  Surgeon: Vanna Scotland, MD;  Location: [facility] ORS;  Service: Urology;  Laterality: N/A;   RIGHT/LEFT HEART CATH AND CORONARY ANGIOGRAPHY N/A 04/13/2022   Procedure: RIGHT/LEFT HEART CATH  AND CORONARY ANGIOGRAPHY;  Surgeon: Dolores Patty, MD;  Location: [facility] INVASIVE CV LAB;  Service: Cardiovascular;  Laterality: N/A;    Family History: Family History  Problem Relation Age of Onset   Hypertension Mother    Prostate cancer Brother    Mental illness Neg Hx     Social History: Social History   Socioeconomic History   Marital status: Single    Spouse name: Not on file   Number of children: 2   Years of education: Not on file   Highest education level: 6th grade  Occupational History   Not on file  Tobacco Use   Smoking status: Former    Current packs/day: 0.00    Average packs/day: 0.5 packs/day for 52.0 years (26.0 ttl pk-yrs)    Types: Cigarettes    Start date: 07/05/1970    Quit date: 07/05/2022    Years since quitting: 0.7   Smokeless tobacco: Never  Vaping Use   Vaping status: Never Used  Substance and Sexual Activity   Alcohol use: No    Alcohol/week: 0.0 standard drinks of alcohol    Comment: Stopped 2009   Drug use: No   Sexual activity: Not Currently  Other Topics Concern   Not on file  Social History Narrative   ** Merged History Encounter **       Social Drivers of Health   Financial Resource Strain: High Risk (04/12/2022)   Overall Financial Resource Strain (CARDIA)    Difficulty of Paying Living Expenses: Hard  Food Insecurity: Food Insecurity Present (04/19/2023)   Hunger Vital Sign    Worried About Running Out of Food in the Last Year: Often true    Ran Out of Food in the Last Year: Sometimes true  Transportation Needs: No Transportation Needs (04/19/2023)   PRAPARE - Therapist, art (Medical): No    Lack of Transportation (Non-Medical): No  Physical Activity: Inactive (02/05/2017)   Exercise Vital Sign    Days of Exercise per Week: 0 days    Minutes of Exercise per Session: 0 min  Stress: Stress Concern Present (02/05/2017)   [employer] of Occupational Health - Occupational Stress Questionnaire    Feeling of Stress : Rather much  Social Connections: Unknown (04/19/2023)   Social Connection and Isolation Panel [NHANES]    Frequency of Communication with Friends and Family: Twice a week    Frequency of Social Gatherings with Friends and Family: Once a week    Attends Religious Services: Patient declined    Database administrator or Organizations: No    Attends Banker Meetings: Never    Marital Status: Patient declined    Allergies:  Allergies  Allergen Reactions   Adhesive [Tape] Other (See Comments)    After right leg fracture surgery, pt developed a large blister where tape was applied to his right leg. OK to use paper tape.   Eggs-Apples-Oats [Alitraq] Other (See Comments)    Caused chest pains   Imdur [Isosorbide Dinitrate] Other (See Comments)    hallucinations   Singulair [Montelukast Sodium] Other (See Comments)    Hallucinations     Objective:    Vital Signs:   Temp:  [97.4 F (36.3 C)-98.9 F (37.2 C)] 98.9 F (37.2 C) (02/13 1251) Pulse Rate:  [43-211] 88 (02/13 1251) Resp:  [10-39] 21 (02/13 1251) BP: (99-158)/(58-110) 127/88 (02/13 1251) SpO2:  [53 %-100 %] 93 % (02/13 1251) Weight:  [70.2 kg] 70.2 kg (02/13 1251)  Last BM Date : 04/19/23  Weight change: Filed Weights   04/19/23 1251  Weight: 70.2 kg   Intake/Output:   Intake/Output Summary (Last 24 hours) at 04/19/2023 1448 Last data filed at 04/19/2023 1429 Gross per 24 hour  Intake 894 ml  Output 2100 ml  Net -1206 ml    Physical Exam    General: Elderly appearing. No distress on [place] Cardiac: JVP elevated. S1 and S2 present. No  murmurs or rub. Extremities: Warm and dry. 3+ pitting edema.  Neuro: Alert and oriented x3. Affect pleasant.   Telemetry   SR in 80s (personally reviewed)  EKG    NSR 86 bpm  Labs   Basic Metabolic Panel: Recent Labs  Lab 04/18/23 1139 04/18/23 1624 04/19/23 0556  NA 136  --  135  K 4.0  --  3.4*  CL 105  --  101  CO2 22  --  22  GLUCOSE 99  --  84  BUN 13  --  16  CREATININE 1.19 1.13 1.30*  CALCIUM 8.8*  --  8.9  MG  --  2.0  --     Liver Function Tests: Recent Labs  Lab 04/18/23 1139 04/19/23 0556  AST 31 36  ALT 20 25  ALKPHOS 107 107  BILITOT 1.1 1.2  PROT 6.2* 6.8  ALBUMIN 2.9* 3.1*   No results for input(s): "LIPASE", "AMYLASE" in the last 168 hours. No results for input(s): "AMMONIA" in the last 168 hours.  CBC: Recent Labs  Lab 04/18/23 1139 04/18/23 1624 04/19/23 0556  WBC 4.3 4.3 5.6  NEUTROABS 3.3  --   --   HGB 12.0* 12.5* 13.0  HCT 37.1* 38.1* 39.5  MCV 99.5 99.0 98.8  PLT 206 199 208    Cardiac Enzymes: No results for input(s): "CKTOTAL", "CKMB", "CKMBINDEX", "TROPONINI" in the last 168 hours.  BNP: BNP (last 3 results) Recent Labs    06/21/22 1258 01/15/23 1538 04/18/23 1139  BNP 367.7* 444.3* 1,456.0*    ProBNP (last 3 results) No results for input(s): "PROBNP" in the last 8760 hours.   CBG: No results for input(s): "GLUCAP" in the last 168 hours.  Coagulation Studies: No results for input(s): "LABPROT", "INR" in the last 72 hours.   Imaging   No results found.  Medications:    Current Medications:  amiodarone  100 mg Oral Daily   apixaban  5 mg Oral BID   arformoterol  15 mcg Nebulization BID   And   umeclidinium bromide  1 puff Inhalation Daily   cholecalciferol  1,000 Units Oral Daily   dapagliflozin propanediol  10 mg Oral Daily   digoxin  0.125 mg Oral Daily   fluticasone  1 spray Each Nare Daily   furosemide  40 mg Intravenous BID   gabapentin  100 mg Oral TID   multivitamin with minerals  1  tablet Oral Daily   omega-3 acid ethyl esters  1 g Oral Daily   oseltamivir  75 mg Oral BID   pantoprazole  40 mg Oral Daily   ranolazine  500 mg Oral BID   rOPINIRole  1 mg Oral QHS   rosuvastatin  10 mg Oral Daily   sildenafil  20 mg Oral TID    Infusions:  Patient Profile   Mr Baranowski is a 78 y.o. 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  Assessment/Plan   1. Acute on Chronic Biventricular  HFrEF, predominantly RV Failure  - R/LHC (2/24): c/w severe NICM. Minimal CAD, moderate to severe PAH with evidence of cor pulmonale - Echo (11/24) unchanged from prior: EF 40-45%, flattened septum RV failure - NYHA VI on admission. Volume overloaded. - place UNNA boots - increase Lasix to 60 mg bid - continue Farxiga 10 mg daily. - continue digoxin 0.125 mg daily. - continue sildenafil 20 mg tid - continue losartan 12.5 mg daily at bedtime - prev not on spiro for elevated K  2. 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 The component of thrombus in the right main PA has essentially resolved since the prior CTA with a small amount of anterior chronic mural thrombus remaining. Distally, occlusive chronic thrombus remains in the interlobar PA extending into RLL and RML pulmonary arteries - PFTs 12/24 with severe COPD. DLCO 10.2 - continue sildenafil 20 mg tid - continue eliquis 5 bid   3. Acute on Chronic Hypoxic Respiratory Failure - PFTs 12/24 with severe COPD, DLCO 10.2 - COPD at b/l + submassive PE - 3L O2 at home - Influenza A + On  Tamiflu - now at baseline O2 supp  4. PAF  - NSR on tele - continue amio 100 mg daily. Not ideal with COPD.  - continue Eliquis 5 mg bid.  5. CAD - minimal on cath in 2/24 - no asa with eliquis - continue crestor 10 mg daily   6. Tobacco abuse - Still smoking - Discussed cessation    7. LE claudication - prev ordered ABIs, but did not go to appointment  8. Fall - h/o sciatic nerve pain in hips - with pain, BLE edema, and claudication had mechanical fall at home - L hip XR ordered  9. Hypokalemia - supp to goal k>4  Length of Stay: 1  [place] Ipek Westra, NP  04/19/2023, 2:48 PM  Advanced Heart Failure Team Pager [phone] (M-F; 7a - 5p)  Please contact [facility] for night-coverage after hours (4p -7a ) and weekends on [email or web address]

## 2023-04-19 NOTE — Progress Notes (Signed)
Heart Failure Navigator Progress Note  Assessed for Heart & Vascular TOC clinic readiness.  Patient does not meet criteria due to Advanced Heart Failure Team patient of Dr. Gala Romney. .   Navigator will sign off at this time.   Rhae Hammock, BSN, Scientist, clinical (histocompatibility and immunogenetics) Only

## 2023-04-19 NOTE — Progress Notes (Signed)
Orthopedic Tech Progress Note Patient Details:  ANGUS AMINI [birth year] [id number]  Ortho Devices Type of Ortho Device: Radio broadcast assistant Ortho Device/Splint Location: BLE Ortho Device/Splint Interventions: Application, Ordered   Post Interventions Patient Tolerated: Well  Syona Wroblewski A Lissete Maestas 04/19/2023, 5:00 PM

## 2023-04-19 NOTE — Progress Notes (Addendum)
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   Today, he reports improvement in his breathing. He denies current chest pain. He reports orthopnea that is chronic. Patient also reports bilateral lower extremity discomfort this morning and that his feet "feel like ice".   Objective:  Vital signs in last 24 hours: Vitals:   04/19/23 0615 04/19/23 0627 04/19/23 0700 04/19/23 0900  BP:   99/65 105/66  Pulse: 73  67 72  Resp: (!) 24  17 (!) 22  Temp:  98.7 F (37.1 C)    TempSrc:      SpO2: 100%  99% 94%   Physical Exam: General:NAD, resting in bed Cardiac:RRR, no murmur appreciated, JVP present, upper extremities are warm, bilateral feet are cool, remaining LE are warm  Pulmonary:Bibasilar crackles present, R rhonchi present  Abdominal:soft, non tender  Neuro:awake, alert participating in conversation  XBJ:[id number] pitting edema present bilaterally to the level of the patella's  Skin:warm and dry  Psych:  normal mood and affect       Latest Ref Rng & Units 04/19/2023    5:56 AM 04/18/2023    4:24 PM 04/18/2023   11:39 AM  CBC  WBC 4.0 - 10.5 K/uL 5.6  4.3  4.3   Hemoglobin 13.0 - 17.0 g/dL 30.8  65.7  84.6   Hematocrit 39.0 - 52.0 % 39.5  38.1  37.1   Platelets 150 - 400 K/uL 208  199  206        Latest Ref Rng & Units 04/19/2023    5:56 AM 04/18/2023    4:24 PM 04/18/2023   11:39 AM  CMP  Glucose 70 - 99 mg/dL 84   99   BUN 8 - 23 mg/dL 16   13   Creatinine 9.62 - 1.24 mg/dL 9.52  8.41  3.24   Sodium 135 - 145 mmol/L 135   136   Potassium 3.5 - 5.1 mmol/L 3.4   4.0   Chloride 98 - 111 mmol/L  101   105   CO2 22 - 32 mmol/L 22   22   Calcium 8.9 - 10.3 mg/dL 8.9   8.8   Total Protein 6.5 - 8.1 g/dL 6.8   6.2   Total Bilirubin 0.0 - 1.2 mg/dL 1.2   1.1   Alkaline Phos 38 - 126 U/L 107   107   AST 15 - 41 U/L 36   31   ALT 0 - 44 U/L 25   20      Assessment/Plan:  Principal Problem:   Acute decompensated heart failure (HCC) Active Problems:   PAF (paroxysmal atrial fibrillation) (HCC)   Pulmonary HTN, group IV   Prolonged Q-T interval on ECG   Influenza A   Cor pulmonale (chronic) (HCC)  Acute on chronic hypoxic respiratory failure 2/2 HFrEF exacerbation  Biventricular HFrEF Cor-Pulmonale Patient presented with acute hypoxic respiratory failure due to a HFrEF exacerbation. He remains volume overloaded on exam, he is hemodynamically stable. Patient will require careful diuresis in setting of known RHF and close monitoring for any progression to cardiogenic shock.  Plan: -Cardiology is consulted, HF team will follow: appreciate recommendations.  -Continue farixga  10 mg, will hold losartan  -On home Ranexa 500mg  BID  -Strict Ins and outs -Daily standing weights -Continue Telemetry  -IV lasix 40 mg BID    Influenza A Influenza A positive in emergency department, likely contributing to his respiratory failure as well.  Plan: - Continue Tamiflu 75 mg BID for 5 days   Elevated Troponin Patient presented with an initial troponin of 58 that down trended to 53, another troponin was collected and it was 69. Patient appears well and denies chest pain. I suspect that this is due to demand ischemia. -Follow repeat troponin -EKG this morning for prolonged Qtc, will evaluate for ST segment changes as well    Elevation in Serum Creatinine Likely due to starting losartan after noncompliance  -BMP tomorrow morning    Fall I suspect that patient's fall was due to orthostatic hypotension and/or hypoxia due to current acute on chronic hypoxic respiratory failure. -Ordered  orthostatic vitals -Will monitor for arrhthymias on telemetry    PAF Remains in  NSR on exam, on a home regimen of Digoxin, Amiodarone, Eliquis.  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 Home regiment of Albuterol and Stiolto Respimat Plan: - Sildenafil 20mg  TID  - Continue home Albuterol and Stiolto Respimat - Continue Eliquis    Prolonged Qtc EKG reveals a Qtc of 497 -EKG repeat this morning  -Will monitor K, Mag, Ca - of K supplemented today    MDD GAD Not on SSRI. On home regimen of Xanax 0.5mg  TID PRN - Will start xanax 0.5 mg BID to avoid withdrawal    Restless Leg Syndrome - Ropinerole 1mg  at bedtime    Resolved Problems:  __________________________________  Code Status: DNR/DNI VTE Prophylaxis:eliquis  Diet:HH IVF:N/A Barriers to Discharge:Medical treatment  Dispo: Anticipated discharge in approximately 2 day(s).   Faith Rogue, DO 04/19/2023, 10:40 AM Pager: [phone] After 5pm on weekdays and 1pm on weekends: On Call pager [phone]

## 2023-04-19 NOTE — ED Notes (Signed)
Patient will not keep monitor on I have placed him on it over 20 times tonight says he does not like all the wires.

## 2023-04-19 NOTE — Plan of Care (Signed)

## 2023-04-19 NOTE — ED Notes (Signed)
CCMD called.

## 2023-04-19 NOTE — ED Notes (Signed)
Patient had a BM on himself he did not right the called bell to ask for help. Patient cleaned and sheets changed. Patient given fresh clean gown and placed back on the cardiac monitor with pulse ox. Lab at bedside drawing patients labs. Patient is A&O times 4.  Patient is in on noted distress at the present time will continue to monitor for any changes.

## 2023-04-20 DIAGNOSIS — J09X2 Influenza due to identified novel influenza A virus with other respiratory manifestations: Secondary | ICD-10-CM | POA: Diagnosis not present

## 2023-04-20 DIAGNOSIS — R9431 Abnormal electrocardiogram [ECG] [EKG]: Secondary | ICD-10-CM | POA: Diagnosis not present

## 2023-04-20 DIAGNOSIS — J9601 Acute respiratory failure with hypoxia: Secondary | ICD-10-CM | POA: Diagnosis not present

## 2023-04-20 DIAGNOSIS — J9602 Acute respiratory failure with hypercapnia: Secondary | ICD-10-CM

## 2023-04-20 DIAGNOSIS — J9621 Acute and chronic respiratory failure with hypoxia: Secondary | ICD-10-CM | POA: Diagnosis not present

## 2023-04-20 DIAGNOSIS — J101 Influenza due to other identified influenza virus with other respiratory manifestations: Secondary | ICD-10-CM | POA: Diagnosis not present

## 2023-04-20 DIAGNOSIS — I509 Heart failure, unspecified: Secondary | ICD-10-CM | POA: Diagnosis not present

## 2023-04-20 DIAGNOSIS — J449 Chronic obstructive pulmonary disease, unspecified: Secondary | ICD-10-CM

## 2023-04-20 LAB — CBC
HCT: 36 % — ABNORMAL LOW (ref 39.0–52.0)
Hemoglobin: 12 g/dL — ABNORMAL LOW (ref 13.0–17.0)
MCH: 32.2 pg (ref 26.0–34.0)
MCHC: 33.3 g/dL (ref 30.0–36.0)
MCV: 96.5 fL (ref 80.0–100.0)
Platelets: 198 10*3/uL (ref 150–400)
RBC: 3.73 MIL/uL — ABNORMAL LOW (ref 4.22–5.81)
RDW: 13.9 % (ref 11.5–15.5)
WBC: 5.8 10*3/uL (ref 4.0–10.5)
nRBC: 0 % (ref 0.0–0.2)

## 2023-04-20 LAB — BASIC METABOLIC PANEL
Anion gap: 9 (ref 5–15)
BUN: 20 mg/dL (ref 8–23)
CO2: 26 mmol/L (ref 22–32)
Calcium: 8.5 mg/dL — ABNORMAL LOW (ref 8.9–10.3)
Chloride: 98 mmol/L (ref 98–111)
Creatinine, Ser: 1.29 mg/dL — ABNORMAL HIGH (ref 0.61–1.24)
GFR, Estimated: 58 mL/min — ABNORMAL LOW (ref >60)
Glucose, Bld: 103 mg/dL — ABNORMAL HIGH (ref 70–99)
Potassium: 3.8 mmol/L (ref 3.5–5.1)
Sodium: 133 mmol/L — ABNORMAL LOW (ref 135–145)

## 2023-04-20 LAB — MAGNESIUM: Magnesium: 1.9 mg/dL (ref 1.7–2.4)

## 2023-04-20 LAB — LACTIC ACID, PLASMA: Lactic Acid, Venous: 1.7 mmol/L (ref 0.5–1.9)

## 2023-04-20 MED ORDER — IPRATROPIUM-ALBUTEROL 0.5-2.5 (3) MG/3ML IN SOLN: 3.0000 mL | Freq: Four times a day (QID) | RESPIRATORY_TRACT | Status: DC | PRN

## 2023-04-20 MED ORDER — SALINE SPRAY 0.65 % NA SOLN
1.0000 | NASAL | Status: DC | PRN
Start: 2023-04-20 — End: 2023-04-21
  Administered 2023-04-20 – 2023-04-21 (×2): 1 via NASAL
  Filled 2023-04-20: qty 44

## 2023-04-20 MED ORDER — POTASSIUM CHLORIDE CRYS ER 20 MEQ PO TBCR
40.0000 meq | EXTENDED_RELEASE_TABLET | Freq: Once | ORAL | Status: AC
  Administered 2023-04-20: 40 meq via ORAL
  Filled 2023-04-20: qty 2

## 2023-04-20 MED ORDER — BUDESONIDE 0.25 MG/2ML IN SUSP
0.2500 mg | Freq: Two times a day (BID) | RESPIRATORY_TRACT | Status: DC
  Administered 2023-04-20 – 2023-04-25 (×10): 0.25 mg via RESPIRATORY_TRACT
  Filled 2023-04-20 (×10): qty 2

## 2023-04-20 MED ORDER — ALBUMIN HUMAN 25 % IV SOLN
25.0000 g | Freq: Four times a day (QID) | INTRAVENOUS | Status: AC
  Administered 2023-04-20 – 2023-04-21 (×3): 25 g via INTRAVENOUS
  Filled 2023-04-20 (×3): qty 100

## 2023-04-20 MED ORDER — OSELTAMIVIR PHOSPHATE 30 MG PO CAPS
30.0000 mg | ORAL_CAPSULE | Freq: Two times a day (BID) | ORAL | Status: AC
  Administered 2023-04-20 – 2023-04-23 (×6): 30 mg via ORAL
  Filled 2023-04-20 (×6): qty 1

## 2023-04-20 MED ORDER — ACETAMINOPHEN 500 MG PO TABS
1000.0000 mg | ORAL_TABLET | Freq: Four times a day (QID) | ORAL | Status: DC | PRN
  Administered 2023-04-20 – 2023-04-24 (×4): 1000 mg via ORAL
  Filled 2023-04-20 (×4): qty 2

## 2023-04-20 MED ORDER — MAGNESIUM SULFATE 2 GM/50ML IV SOLN
2.0000 g | Freq: Once | INTRAVENOUS | Status: AC
  Administered 2023-04-20: 2 g via INTRAVENOUS
  Filled 2023-04-20: qty 50

## 2023-04-20 MED ORDER — MIDODRINE HCL 5 MG PO TABS
5.0000 mg | ORAL_TABLET | Freq: Three times a day (TID) | ORAL | Status: AC
  Administered 2023-04-20 (×2): 5 mg via ORAL
  Filled 2023-04-20 (×2): qty 1

## 2023-04-20 MED ORDER — SODIUM CHLORIDE 0.9 % IV BOLUS
250.0000 mL | Freq: Once | INTRAVENOUS | Status: AC
  Administered 2023-04-20: 250 mL via INTRAVENOUS

## 2023-04-20 MED ORDER — REVEFENACIN 175 MCG/3ML IN SOLN
175.0000 ug | Freq: Every day | RESPIRATORY_TRACT | Status: DC
  Administered 2023-04-21 – 2023-04-25 (×5): 175 ug via RESPIRATORY_TRACT
  Filled 2023-04-20 (×5): qty 3

## 2023-04-20 MED ORDER — ARFORMOTEROL TARTRATE 15 MCG/2ML IN NEBU
15.0000 ug | INHALATION_SOLUTION | Freq: Two times a day (BID) | RESPIRATORY_TRACT | Status: DC
  Administered 2023-04-20 – 2023-04-25 (×10): 15 ug via RESPIRATORY_TRACT
  Filled 2023-04-20 (×10): qty 2

## 2023-04-20 NOTE — Discharge Instructions (Addendum)
You came to the hospital for shortness of breath and you were diagnosed with a heart failure exacerbation and the flu.  We treated you with IV lasix and tamiflu.   *For your Heart Failure *Pulmonary hypertension  -We have stopped the following medications:  -Losartan, please do not take any more losartan   -Potassium Chloride   *Please note the following medication changes:        -Start Pulmicort (nebulizer), use two times a day        -Rosuvastatin dose changed to 20mg         -Tylenol, please take as need for pain        -Stop Xanax, speak to your primary care provider about this medication change         -Stop taking baclofen        -Stop taking Prilosec    -Please see your cardiologist, heart failure doctor, and lung doctor within the next few weeks -Please see your PCP within the next 7-10 days    If you have any questions or concerns please feel free to call: Internal medicine clinic at [phone]   If you have any of these following symptoms, please call us or seek care at an emergency department: -Chest Pain -Difficulty Breathing -Syncope (passing out) -Drooping of face -Slurred speech -Sudden weakness in your leg or arm -Fever -Chills   We are glad that you are feeling better, it was a pleasure to care for you!  Faith Rogue DO  --------------------------------------------------------------------------------------------------------------------------------------------------------  [facility] assistance programs Crisis assistance programs  -[facility] Coordinated Entry Program. If you are experiencing homelessness in [place], [place], your first point of contact should be Pensions consultant. You can reach Coordinated Entry by calling (336) 425-145-7458 or by emailing [email or web address] [email or web address].  Community access points: [employer] (913)706-9326 [address], [place]) every Tuesday from 9am-10am. [facility]  ([address], [place]) every Wednesday from 8am-9am.   -[place] Coordinated Re-entry Marcy Panning: Dial 211 and request. Offers referrals to homeless shelters in the area.    -The [employer] 319 515 4136) offers several services to local families, as funding allows. The Emergency Assistance Program (EAP), which they administer, provides household goods, free food, clothing, and financial aid to people in need in the [facility] area. The EAP program does have some qualification, and counselors will interview clients for financial assistance by written referral only. Referrals need to be made by the Department of Social Services or by other EAP approved human services agencies or charities in the area.  -[facility] of [employer], which can be reached at (978) [phone], offers emergency assistance programs for those in need of help, such as food, rent assistance, a soup kitchen, shelter, and clothing. They are based in [facility] but provide a number of services to those that qualify for assistance.   [facility] Department of Social Services may be able to offer temporary financial assistance and cash grants for paying rent and utilities, Help may be provided for local county residents who may be experiencing personal crisis when other resources, including government programs, are not available. Call (806) [phone]  -High [employer] is a [employer] agency, The organization can offer emergency assistance for paying rent, [employer] Rx, utilities, food, household products and furniture. They offer extensive emergency and transitional housing for families, children and single women, and also run a Boy's and [employer]. Thrift Shops, Secondary school teacher,  and other aid offered too. [address], [place], [place] [address], [phone]  -[place] Low Income Energy Assistance Program -- This is offered for [facility] families. The federal government created [employer] Program provides a one-time cash grant payment to help eligible low-income families pay their electric and heating bills. [address] [place], [place] [address], ([phone]) [phone]  -[place] Emergency Assistance -- A program offers emergency utility and rent funds for greater [employer] area residents. The program can also provide counseling and referrals to charities and government programs. Also provides food and a free meal program that serves lunch Mondays - Saturdays and dinner seven days per week to individuals in the community. [address] [place], [place] [address], [phone]  -[employer] - Offers affordable apartment and housing communities across      [place] and [employer]. The low income and seniors can access public housing, rental assistance to qualified applicants, and apply for the section 8 rent subsidy program. Other programs include Chiropractor and Engineer, maintenance. [address] [place], [place] [address], dial [phone].  -The [facility] provides transitional housing to veterans and the disabled. Clients will also access other services too, including assistance in applying for Disability, life skills classes, case management, and assistance in finding permanent housing. [address], [place], [place] [address], call [phone]  -[facility] [place] Transitional Housing through [employer] is for people who were just evicted or that are formerly homeless. The non-profit will also help then gain self-sufficiency, find a home or apartment to live in, and also provides information on rent assistance when needed. Phone [phone]  -The [place] [employer] [facility] helps low income, elderly, or disabled residents in seven  counties in the [place] ([place], [place], [place], [place], [place], [place], [place], and [place]) save energy and reduce their utility bills by improving energy efficiency. Phone [phone].  -[employer] is located in the [place] [facility] in the [employer], [address], [address], [place], [place] [address]. Parking is in the rear of the building. Phone: ([phone]   General Email: info@gsohc .org  [facility] provides free housing counseling assistance in locating affordable rental housing or housing with support services for families and individuals in crisis and the chronically homeless. We provide potential resources for other housing needs like utilities. Our trained counselors also work with clients on budgeting and financial literacy in effort to empower them to take control of their financial situations. [employer] collaborates with homeless service providers and other stakeholders as part of the [employer] COC (Continuum of Care). The (COC) is a regional/local planning body that coordinates housing and services funding for homeless families and individuals. The role of [facility] in the COC is through housing counseling to work with people we serve on diversion strategies for those that are at imminent risk of becoming homeless. We also work with the Coordinated Assessment/Entry Specialist who attempts to find temporary solutions and/or connects the people to Housing First, Rapid Re-housing or transitional housing programs. Our Homelessness Prevention Housing Counselors meet with clients on business days (Monday-Fridays, except scheduled holidays) from 8:30 am to 4:30 pm.  Legal assistance for evictions, foreclosure, and more -If you need free legal advice on civil issues, such as foreclosures, evictions, Electronics engineer, government programs, domestic issues and more, Armed forces operational officer Aid of [facility]) is a Associate Professor firm that  provides free legal services and counsel to lower income people, seniors, disabled, and others, The goal is to ensure everyone has access to justice and fair representation. Call them at ([phone].  [facility] for Housing and Community Studies can provide info about obtaining legal assistance with evictions. Phone [phone].  Data processing manager  The [employer], [employer]. offers job and Dispensing optician. Resources are focused on helping students obtain the skills and experiences that are necessary to compete in today's challenging and tight job market. The non-profit faith-based community action agency offers internship trainings as well as classroom instruction. Classes are tailored to meet the needs of people in the [facility] region. [place], [place] [address], [phone]  Foreclosure prevention/Debt Services Family Services of the [employer] Credit Counseling Service inludes debt and foreclosure prevention programs for local families. This includes money management, financial advice, budget review and development of a written action plan with a Pensions consultant to help solve specific individual financial problems. In addition, housing and mortgage counselors can also provide pre- and post-purchase homeownership counseling, default resolution counseling (to prevent foreclosure) and reverse mortgage counseling. A Debt Management Program allows people and families with a high level of credit card or medical debt to consolidate and repay consumer debt and loans to creditors and rebuild positive credit ratings and scores. Contact (336) Q4373065.  Community clinics in [place] -Health Department [facility]: [address] [place], [place], [address]. [phone].  -Health Department [place] [facility]: [address], [facility], [address]. [phone].  -[facility] offers medical care through a  group of doctors, pharmacies and other healthcare related agencies that offer services for low income, uninsured adults in [place]. Also offers adult Dental care and assistance with applying for an [employer]. Call [phone].   [facility]. This center provides low-cost health care to those without health insurance. Services offered include an onsite pharmacy. Phone [phone]. [address], [place].  -Medication Assistance Program serves as a link between pharmaceutical companies and patients to provide low cost or free prescription medications. This service is available for residents who meet certain income restrictions and have no insurance coverage. PLEASE CALL [phone] Ginette Otto) OR [phone] ([place])  -One Step Further: Materials engineer, The [employer] Support & Nutrition Program, [employer]. Call [phone]/ ([phone]) [phone].  Food pantry and assistance -[facility]-Food Bank: [address], [place] [address]. Phone [phone]  -[facility] Food Pantry: [address] [place], [place] [address]. ([phone])[phone].  -Missionary Ministry: has the purpose of visiting the sick and shut-ins and provide for needs in the surrounding communities. Call (678)507-5623. Email: [email or web address] .com This program provides: Food box for seniors, Financial assistance, Food to meet basic nutritional needs.  -Meals on Wheels with Senior Resources: [facility] residents age 72 and over who are homebound and unable to obtain and prepare a nutritious meal for themselves are eligible for this service. There may be a waiting list in certain parts of [facility] if the route in that area is full. If you are in [facility] and [place] call [phone] to register. For all other areas call [phone] to register.  -Greater Dietitian:  https://findfood.BargainContractor.si  TRANSPORTATION: -[employer] Department of Health: Call [facility] and [employer] at 8650752078 for details. AttractionGuides.es  -Access [place]: Access [place] is the [employer]'s shared-ride transportation service  for eligible riders who have a disability that prevents them from riding the fixed route bus. Call [phone]. Access [place] riders must pay a fare of $1.50 per trip, or may purchase a 10-ride punch card for $14.00 ($1.40 per ride) or a 40-ride punch card for $48.00 ($1.20 per ride).  -The Shepherd's WHEELS rideshare transportation service is provided for senior citizens (60+) who live independently within [place] city limits and are unable to drive or have limited access to transportation. Call [phone] to schedule an appointment.  -[facility] Transportation: For Medicare or Medicaid recipients call 386-028-2061?[facility], wheelchair Zenaida Niece, and ambulatory quotes available.   FLEEING VIOLENCE: -Family Services of the [place]- 24/7 Crisis line 902-727-4757) -[facility] Justice Centers: (336) 641-SAFE [phone])  [place] 2-1-1 is another useful way to locate resources in the community. Visit ShedSizes.ch to find service information online. If you need additional assistance, 2-1-1 Referral Specialists are available 24 hours a day, every day by dialing 2-1-1 or (445) [phone] from any phone. The call is free, confidential, and available in any language.  Affordable Housing Search http://www.nchousingsearch.org  [facility])   M-F 8a-3p [address] [place], [place] [address] [phone] Services include: laundry, barbering, support groups, case management, phone & computer access, showers, AA/NA mtgs, mental health/substance abuse nurse, job skills class, disability information, VA  assistance, spiritual classes, etc. Winter Shelter available when temperatures are less than 32 degrees.   HOMELESS SHELTERS [facility] at [facility]- Call [phone] ext. 347 or ext. 336. Located at [address], [place], [place] [address]  [facility] Mens Shelter- Call [phone]. Located at [address] [place] [address].  [facility]- [employer]. Call [phone]. Office located at [address], [place] [address].  [facility] Family Housing through [place] [phone].  [facility]- Call [phone]. Located at [address] [place], [place], [place] [address].  Room at the Inn-For Pregnant mothers. Call [phone]. Located at [address]. [place], [address].  [facility]-For men in [place]. Call ([phone]. [facility]-Shelter in [place]. Call [phone].  Home of [employer] for [employer] [phone]. Office located at [address], [place], [address].  [employer] be agreeable to help with chores. Call [phone] ext. [phone].  Men's: [address] [place],  [address]. Women's: [facility]  [address] [place], [place] [address]  Crisis Services [facility] Mobile Crisis Management- [phone]  [facility] [address] [place], [place] [address]. Phone: [phone]

## 2023-04-20 NOTE — Progress Notes (Signed)
Heart Failure Progress Note  Called to bedside by RN for hypotension after receiving morning meds and getting up to the chair. On exam in the room patient was asleep easily arousable. SBP in high 70s with MAPs in the 50s. Reported some dizziness but states that he "it comes and goes" throughout the day. Patient got back to bed and legs elevated, slowly SBP returned to 80s with MAPs in 60s. Patient maintained mentation throughout. Extremities warm. Increased SOB with lying back, although weaned to 2L [place] with SpO2 at 94%.  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 However patient remains with significant pulmonary and venous congestions.   Plan: -Hold PM dose of Lasix and Sildenafil -Give Midodrine 5mg  x2 today -Continue to monitor BP closely in the setting of Flu+, could also be presenting with decreased SVR/SIRS 2/2 FluA  Bruce Barney Gertsch, NP 04/20/23 12:02 PM

## 2023-04-20 NOTE — Progress Notes (Signed)
   04/20/23 1118  Vitals  Temp (!) 97.3 F (36.3 C)  Temp Source Oral  BP (!) 79/54  MAP (mmHg) (!) 63  BP Location Right Arm  BP Method Automatic  Patient Position (if appropriate) Sitting  Pulse Rate 63  Pulse Rate Source Monitor  Level of Consciousness  Level of Consciousness Alert  MEWS COLOR  MEWS Score Color Yellow  Oxygen Therapy  SpO2 98 %  O2 Device Nasal Cannula  O2 Flow Rate (L/min) 6 L/min  MEWS Score  MEWS Temp 0  MEWS Systolic 2  MEWS Pulse 0  MEWS RR 0  MEWS LOC 0  MEWS Score 2   Md notified.

## 2023-04-20 NOTE — Plan of Care (Signed)

## 2023-04-20 NOTE — Progress Notes (Signed)
PT Cancellation Note  Patient Details Name: Bruce [name] MRN: [id number] DOB: 1945/07/15   Cancelled Treatment:    Reason Eval/Treat Not Completed: Medical issues which prohibited therapy (Pt remain hypotensive with midodrine already given. BP 85/57 mmHg in supine. Pt has been symptomatic today. Will follow up at later date/time as pt able and schedule allows.)   Renaldo Fiddler PT, DPT Acute Rehabilitation Services Office [phone]  04/20/23 3:46 PM

## 2023-04-20 NOTE — TOC CM/SW Note (Addendum)
Transition of Care [facility]) - Inpatient Brief Assessment   Patient Details  Name: Bruce [name] MRN: [id number] Date of Birth: 1945-05-24  Transition of [facility]) CM/SW Contact:    Michaela Corner, LCSWA Phone Number: 04/20/2023, 12:24 PM   Clinical Narrative: CSW reviewed pts SDOH needs and added resources for housing and food to pts AVS. CSW notified pt.   TOC will continue to follow as needed.    Transition of Care Asessment: Insurance and Status: Insurance coverage has been reviewed Patient has primary care physician: Yes Home environment has been reviewed: Home Prior level of function:: independent Prior/Current Home Services: No current home services Social Drivers of Health Review: SDOH reviewed interventions complete Readmission risk has been reviewed: Yes Transition of care needs: no transition of care needs at this time

## 2023-04-20 NOTE — Consult Note (Signed)
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 States exertional dyspnea has worsened ever since PE. In ED, reported O2 sat with EMS in 70s. Labs were notable for BNP 1456, troponin 58>53, influenza A positive. CXR with edema. He was given 20mg  IV lasix, tamiflu and admitted to medicine for respiratory and heart failure. Since admission has had worsening hypotension. AHF was engaged for recommendations and stopped Lasix and Sildenafil for now. They started him on midodrine. 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 Eval by cardiology - started on diuresis, rec AHF team to see.  2/13: AHF eval - increasing lasix  2/14: hypotension. Was given lorsartan night prior. ~noon AHF called to bedside with SBP 70s, dizziness. Recommended holding pm lasix and sildenafil, started midodrine  Interim History / Subjective:  On exam patient is mentating, not dyspneic. Conversational. On 3LNC sat 91-92%. SBP's 90s. 1-2+ pitting edema in bilateral lower extremities.   Objective   Blood pressure (!) 85/57, pulse 61, temperature (!) 97.3 F (36.3 C), temperature source Oral, resp. rate 20, height 6\' 1"  (1.854 m), weight 70.2 kg,  SpO2 90%.        Intake/Output Summary (Last 24 hours) at 04/20/2023 1537 Last data filed at 04/20/2023 1304 Gross per 24 hour  Intake 1851 ml  Output 2225 ml  Net -374 ml   Filed Weights   04/19/23 1251 04/20/23 0704  Weight: 70.2 kg 70.2 kg    Examination: General: older male, laying in bed, no acute distress  HENT: [place]/AT, anicteric sclera, mucous membranes dry  Lungs: crackles lower lobes, 3LNC (baseline), very mild increased wob Cardiovascular: s1s2 no appreciable murmur, 1-2+ pitting edema, +JVD  Abdomen: rounded, soft  Extremities: 1-2+ LE pitting edema Neuro: awake, alert, oriented x3, non focal GU: defer  Resolved Hospital Problem list    Assessment & Plan:  Hypotension  Borderline BP on exam. Past 3-4 pressures with systolics 90s and MAPs >65. Mentating. Making urine. Lactic is normal. Likely has been over-diuresed and is dry on left side with ongoing RV failure and peripheral congestion. Do not think this is SIRS response from flu. He feels improved from this stand point. No fevers. No increased leukocytosis.  - agree with midodrine started by AHF  - will give albumin 25g q6h x3 doses  - agree with holding lasix and sildenafil for now  - would continue digoxin  - do not feel he needs invasive monitoring or ICU level care at this time   Acute on chronic heart failure EF 40-45% with RV failure  CAD  PAF pHTN - AHF following for this, agree with plan for holding lasix, sildenafil for now  - continue digoxin, amiodarone, farxiga, eliquis, statin   Acute on chronic hypoxic and hypercapneic respiratory failure  COPD  Influenza A+  Back  on home 3LNC. Not significantly distressed on exam. No wheezing.  - con't tamiflu  - starting triple therapy with brovana, pulmicort, yupelri for underlying COPD  - switch albuterol to duonebs PRN   Patient stable at this time to stay in progressive unit with ongoing cardiac monitoring. No indication for move to ICU at this time.  CCM will sign off for now. Please re-engage if clinically worsens. Thank you.    Best Practice (right click and "Reselect all SmartList Selections" daily)  Per primary team   Diet/type:  DVT prophylaxis:  Pressure ulcer(s).  GI prophylaxis:  Lines:  Foley:   Code Status:   Last date of multidisciplinary goals of care discussion []   Labs   CBC: Recent Labs  Lab 04/18/23 1139 04/18/23 1624 04/19/23 0556 04/20/23 0253  WBC 4.3 4.3 5.6 5.8  NEUTROABS 3.3  --   --   --   HGB 12.0* 12.5* 13.0 12.0*  HCT 37.1* 38.1* 39.5 36.0*  MCV 99.5 99.0 98.8 96.5  PLT 206 199 208 198    Basic Metabolic Panel: Recent Labs  Lab 04/18/23 1139 04/18/23 1624 04/19/23 0556 04/20/23 0253  NA 136  --  135 133*  K 4.0  --  3.4* 3.8  CL 105  --  101 98  CO2 22  --  22 26  GLUCOSE 99  --  84 103*  BUN 13  --  16 20  CREATININE 1.19 1.13 1.30* 1.29*  CALCIUM 8.8*  --  8.9 8.5*  MG  --  2.0  --  1.9   GFR: Estimated Creatinine Clearance: 49.1 mL/min (A) (by C-G formula based on SCr of 1.29 mg/dL (H)). Recent Labs  Lab 04/18/23 1139 04/18/23 1624 04/19/23 0556 04/20/23 0253 04/20/23 1215  WBC 4.3 4.3 5.6 5.8  --   LATICACIDVEN  --   --   --   --  1.7    Liver Function Tests: Recent Labs  Lab 04/18/23 1139 04/19/23 0556  AST 31 36  ALT 20 25  ALKPHOS 107 107  BILITOT 1.1 1.2  PROT 6.2* 6.8  ALBUMIN 2.9* 3.1*   No results for input(s): "LIPASE", "AMYLASE" in the last 168 hours. No results for input(s): "AMMONIA" in the last 168 hours.  ABG    Component Value Date/Time   PHART 7.434 04/13/2022 1118   PCO2ART 26.5 (L) 04/13/2022 1118   PO2ART 56 (L) 04/13/2022 1118   HCO3 24.6 04/18/2023 1826   TCO2 24 04/13/2022 1125   TCO2 25 04/13/2022 1125   ACIDBASEDEF 1.0 04/13/2022 1125   O2SAT 59.6 04/18/2023 1826     Coagulation Profile: No results for input(s): "INR", "PROTIME" in the last 168 hours.  Cardiac Enzymes: No results for input(s): "CKTOTAL", "CKMB",  "CKMBINDEX", "TROPONINI" in the last 168 hours.  HbA1C: Hgb A1c MFr Bld  Date/Time Value Ref Range Status  02/05/2023 09:51 AM 5.6 4.8 - 5.6 % Final    Comment:             Prediabetes: 5.7 - 6.4          Diabetes: >6.4          Glycemic control for adults with diabetes: <7.0   11/02/2022 02:06 PM 5.7 (H) 4.8 - 5.6 % Final    Comment:             Prediabetes: 5.7 - 6.4          Diabetes: >6.4  Glycemic control for adults with diabetes: <7.0     CBG: No results for input(s): "GLUCAP" in the last 168 hours.  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 (01/01/2015), Complication of anesthesia, COPD (chronic obstructive pulmonary disease) (HCC), Coronary artery disease, Edema (08/16/2010), GERD (gastroesophageal reflux disease), Headache, History of blood transfusion, History of kidney stones, History of MI (myocardial infarction) (10/08/2013), Hypertension, Hypokalemia (04/09/2022), Intracranial bleed (HCC) (12/31/2018), Lactic acidosis (04/09/2022), Leg cramps (08/16/2010), Malignant neoplasm of prostate (HCC) (07/13/2016), Multiple closed facial bone fractures (HCC), Myocardial infarction [facility]), Peripheral vascular disease (HCC), Shortness of breath, Tibia/fibula fracture (10/06/2013), Tibial plateau fracture (12/29/2014), 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HCC) (10/08/2013), UTI (urinary tract infection), and Visual hallucination (03/18/2015).   Surgical History:   Past Surgical History:  Procedure Laterality Date   CARDIAC CATHETERIZATION     CHOLECYSTECTOMY     EXTERNAL FIXATION LEG Right 10/06/2013    Procedure: CLOSED REDUCTION RIGHT TIBIAL PLATEAU FRACTURE, EXTERNAL FIXATION RIGHT LEG, PLACEMENT OF WOUND VAC;  Surgeon: Budd Palmer, MD;  Location: [facility] OR;  Service: Orthopedics;  Laterality: Right;   FEMORAL-POPLITEAL BYPASS GRAFT Right 10/06/2013   Procedure: RIGHT POPLITEAL-POPLITEAL ARTERY BYPASS GRAFT;  Surgeon: Larina Earthly, MD;  Location: [facility];  Service: Vascular;  Laterality: Right;   FRACTURE SURGERY Right 2014   HARDWARE REMOVAL Right 12/02/2013   Procedure: REMOVAL EXTERNAL FIXATION RIGHT LEG ;  Surgeon: Budd Palmer, MD;  Location: [facility] OR;  Service: Orthopedics;  Laterality: Right;   HERNIA REPAIR Right 1990's   I & D EXTREMITY Right 10/09/2013   Procedure: IRRIGATION AND DEBRIDEMENT RIGHT LEG, CLOSURE  OF WOUNDS, PLACEMENT OF WOUND VAC ON EACH SIDE OF LEG;  Surgeon: Budd Palmer, MD;  Location: [facility] OR;  Service: Orthopedics;  Laterality: Right;   IR ANGIOGRAM PULMONARY RIGHT SELECTIVE  04/10/2022   IR ANGIOGRAM SELECTIVE EACH ADDITIONAL VESSEL  04/10/2022   IR NEPHROSTOMY PLACEMENT LEFT  06/19/2016   IR THROMBECT PRIM MECH INIT (INCLU) MOD SED  04/10/2022   IR US GUIDE VASC ACCESS RIGHT  04/10/2022   IVC filter  2009   placed @ [facility]/ Removed in 2010.   IVC Filter Removed     LEFT HEART CATH AND CORONARY ANGIOGRAPHY Right 10/11/2018   Procedure: LEFT HEART CATH AND CORONARY ANGIOGRAPHY;  Surgeon: Laurier Nancy, MD;  Location: [facility] INVASIVE CV LAB;  Service: Cardiovascular;  Laterality: Right;   ligament leg Left    NEPHROLITHOTOMY Left 06/19/2016   Procedure: NEPHROLITHOTOMY PERCUTANEOUS;  Surgeon: Vanna Scotland, MD;  Location: [facility] ORS;  Service: Urology;  Laterality: Left;   ORIF TIBIA FRACTURE Right 12/29/2014   Procedure: OPEN REDUCTION INTERNAL FIXATION (ORIF) RIGHT TIBIA FRACTURE, RIA VS ICBG;  Surgeon: Myrene Galas, MD;  Location: [facility] OR;  Service: Orthopedics;  Laterality: Right;   RADIOACTIVE SEED IMPLANT N/A 09/12/2016   Procedure: RADIOACTIVE SEED IMPLANT/BRACHYTHERAPY  IMPLANT;  Surgeon: Vanna Scotland, MD;  Location: [facility] ORS;  Service: Urology;  Laterality: N/A;   RIGHT/LEFT HEART CATH AND CORONARY ANGIOGRAPHY N/A 04/13/2022   Procedure: RIGHT/LEFT HEART CATH AND CORONARY ANGIOGRAPHY;  Surgeon: Dolores Patty, MD;  Location: [facility] INV[facility] LAB;  Service: Cardiovascular;  Laterality:  N/A;     Social History:   reports that he quit smoking about 9 months ago. His smoking use included cigarettes. He started smoking about 52 years ago. He has a 26 pack-year smoking history. He has never used smokeless tobacco. He reports that he does not drink alcohol and does not use drugs.   Family History:  His family history includes Hypertension in his mother; Prostate cancer in his brother. There is no history of Mental illness.   Allergies Allergies  Allergen Reactions   Adhesive [Tape] Other (See Comments)    After right leg fracture surgery, pt developed a large blister where tape was applied to his right leg. OK to use paper tape.   Eggs-Apples-Oats [Alitraq] Other (See Comments)    Caused chest pains   Imdur [Isosorbide Dinitrate] Other (See Comments)    hallucinations   Singulair [Montelukast Sodium] Other (See Comments)    Hallucinations      Home Medications  Prior to Admission medications   Medication Sig Start Date End Date Taking? Authorizing Provider  amiodarone (PACERONE) 200 MG tablet Take 0.5 tablets (100 mg total) by mouth daily. Patient taking differently: Take 100 mg by mouth in the morning. 08/25/22  Yes Bensimhon, Bevelyn Buckles, MD  apixaban (ELIQUIS) 5 MG TABS tablet TAKE ONE TABLET (5 MG TOTAL) BY MOUTH TWO (TWO) TIMES DAILY. Patient taking differently: Take 5 mg by mouth 2 (two) times daily. 09/26/22  Yes Bensimhon, Bevelyn Buckles, MD  cetirizine (ZYRTEC) 10 MG tablet Take 1 tablet (10 mg total) by mouth daily. Patient taking differently: Take 10 mg by mouth in the morning. 05/12/22  Yes [name], NP  cholecalciferol (VITAMIN D3) 25 MCG  (1000 UNIT) tablet Take 1,000 Units by mouth in the morning.   Yes [provider]  digoxin (LANOXIN) 0.125 MG tablet TAKE ONE TABLET (0.125 MG TOTAL) BY MOUTH DAILY. Patient taking differently: Take 0.125 mg by mouth in the morning. 01/11/23  Yes Bensimhon, Bevelyn Buckles, MD  FARXIGA 10 MG TABS tablet TAKE ONE TABLET (10 MG TOTAL) BY MOUTH DAILY. Patient taking differently: Take 10 mg by mouth in the morning. 12/15/22  Yes Milford, Anderson Malta, FNP  fluticasone (FLONASE) 50 MCG/ACT nasal spray Place 1 spray into both nostrils daily as needed for allergies. 03/26/23  Yes [provider]  gabapentin (NEURONTIN) 100 MG capsule Take 1 capsule (100 mg total) by mouth 3 (three) times daily. 02/19/23  Yes Miki Kins, FNP  losartan (COZAAR) 25 MG tablet TAKE 0.5 TABLETS (12.5 MG TOTAL) BY MOUTH AT BEDTIME. Patient taking differently: Take 12.5 mg by mouth at bedtime. 11/21/22  Yes Bensimhon, Bevelyn Buckles, MD  Multiple Vitamin (MULTIVITAMIN) tablet Take 1 tablet by mouth in the morning. For Men   Yes [provider]  Omega-3 Fatty Acids (FISH OIL PO) Take 1,000 mg by mouth every evening.   Yes [provider]  omeprazole (PRILOSEC) 40 MG capsule Take 40 mg by mouth 2 (two) times daily. 01/10/23  Yes [provider]  pantoprazole (PROTONIX) 40 MG tablet Take 1 tablet (40 mg total) by mouth daily. Patient taking differently: Take 40 mg by mouth in the morning. 01/10/23  Yes Miki Kins, FNP  potassium chloride 20 MEQ TBCR Take 2 tablets (40 mEq total) by mouth daily. 04/05/23  Yes Bensimhon, Bevelyn Buckles, MD  ranolazine (RANEXA) 500 MG 12 hr tablet TAKE ONE TABLET (500 MG TOTAL) BY MOUTH TWO (TWO) TIMES DAILY. Patient taking differently: Take 500 mg by  mouth 2 (two) times daily. 01/11/23  Yes Bensimhon, Bevelyn Buckles, MD  rOPINIRole (REQUIP) 1 MG tablet TAKE ONE TABLET (1 MG TOTAL) BY MOUTH IN THE MORNING AND AT BEDTIME. Patient taking differently: Take 1 mg by mouth 2 (two)  times daily. 01/15/23  Yes Scoggins, Hospital doctor, NP  rosuvastatin (CRESTOR) 10 MG tablet TAKE ONE TABLET (10 MG TOTAL) BY MOUTH DAILY. Patient taking differently: Take 10 mg by mouth every evening. 11/21/22  Yes Bensimhon, Bevelyn Buckles, MD  sildenafil (REVATIO) 20 MG tablet Take 1 tablet (20 mg total) by mouth 3 (three) times daily. 01/15/23  Yes Milford, Anderson Malta, FNP  Tiotropium Bromide-Olodaterol (STIOLTO RESPIMAT) 2.5-2.5 MCG/ACT AERS Inhale 2 puffs into the lungs daily. 07/20/22 07/20/23 Yes Dgayli, Lianne Bushy, MD  albuterol (VENTOLIN HFA) 108 (90 Base) MCG/ACT inhaler TAKE TWO PUFFS BY MOUTH AS NEEDED FOR WHEEZING EVERY 4-6 HOURS Patient not taking: Reported on 04/18/2023 11/10/22   Laurier Nancy, MD  ALPRAZolam Prudy Feeler) 0.5 MG tablet TAKE ONE TABLET BY MOUTH THREE TIMES A DAY AS NEEDED FOR SLEEP OR ANXIETY 04/19/23   Miki Kins, FNP  baclofen (LIORESAL) 10 MG tablet TAKE ONE TABLET (10 MG TOTAL) BY MOUTH AT BEDTIME AS NEEDED FOR MUSCLE SPASMS. 04/19/23   Miki Kins, FNP  lidocaine (LIDODERM) 5 % Place 1 patch onto the skin daily. Remove & Discard patch within 12 hours or as directed by MD Patient not taking: Reported on 04/18/2023 05/12/22   [name], NP  nitroGLYCERIN (NITROSTAT) 0.4 MG SL tablet Place 0.4 mg under the tongue every 5 (five) minutes as needed for chest pain. Patient not taking: Reported on 04/18/2023    [provider]     Critical care time: na   Lenard Galloway [facility] 04/20/23 4:41 PM  Please see [email or web address] for pager details.  From 7A-7P if no response, please call [phone] After hours, please call ELink [phone]

## 2023-04-20 NOTE — Progress Notes (Signed)
HD#3 SUBJECTIVE:  Patient Summary: Bruce [name] is a 78 year old male with history of HFrEF LVEF 40-45% hypertension, anxiety, hyperlipidemia, GERD, CAD, PAD, severe COPD, DVT and PE requiring thrombectomy on DOAC, blood loss anemia, NSTEMI, A-fib, pulmonary hypertension, Prostate cancer (s/p brachtherapy seeds), cor pulmonale, and MDD presenting with shortness of breath was admitted for acute hypoxic respiratory failure 2/2 HFrEF exacerbation admitted with volume overload complicated by R heart failure and hypotension.  Overnight Events: NAEON  Interim History: Patient expresses frustration over a nasal spray that he doesn't feel is helpful. He wishes to continue only with flonase as it helps his nasal congestion well. He is worried that one of his breathing treatments which he recently purchased has gone missing. His legs feel very tight and painful. Breathing feels okay.  OBJECTIVE:  Vital Signs: Vitals:   04/20/23 2346 04/21/23 0504 04/21/23 0740 04/21/23 0853  BP: 118/79 107/69 125/71   Pulse: 73 69 72 67  Resp: 18 20 15 16   Temp: 98.7 F (37.1 C) 98 F (36.7 C) 97.7 F (36.5 C)   TempSrc: Axillary Oral Oral   SpO2: 93% 90% (!) 87% 90%  Weight:  69.3 kg    Height:       Supplemental O2: Nasal Cannula SpO2: 90 % O2 Flow Rate (L/min): 3 L/min FiO2 (%): 97 %  Filed Weights   04/19/23 1251 04/20/23 0704 04/21/23 0504  Weight: 70.2 kg 70.2 kg 69.3 kg     Intake/Output Summary (Last 24 hours) at 04/21/2023 1059 Last data filed at 04/21/2023 0918 Gross per 24 hour  Intake 1403.18 ml  Output 1000 ml  Net 403.18 ml   Net IO Since Admission: -1,893.82 mL [04/21/23 1059]  Physical Exam: Constitutional:Chronically ill appearing gentleman resting comfortably in bed. In no acute distress. Cardio:Regular rate and rhythm. No murmurs, rubs, or gallops. Pulm:Diffuse wheezing. No significant crackles appreciated. Productive cough. Normal work of breathing on 3L  [place]. MSK/Skin:Bilateral LE with unna boots placed. Extremities feel warm. 1-2+ pitting edema appreciated through unna boots. Tender to palpation. Neuro:Alert and oriented x3. No focal deficit noted. Psych:Pleasant mood and affect.  Patient Lines/Drains/Airways Status     Active Line/Drains/Airways     Name Placement date Placement time Site Days   Peripheral IV 04/18/23 18 G Left Antecubital 04/18/23  1130  Antecubital  2   Wound / Incision (Open or Dehisced) 04/19/23 Non-pressure wound Ankle Right;Lateral 0.7x0.6 cm open area with purlent drainage and pink borders 04/19/23  1300  Ankle  1             ASSESSMENT/PLAN:  Assessment: Principal Problem:   Acute decompensated heart failure (HCC) Active Problems:   PAF (paroxysmal atrial fibrillation) (HCC)   Pulmonary HTN, group IV   Prolonged Q-T interval on ECG   Influenza A   Cor pulmonale (chronic) (HCC)   Acute on chronic respiratory failure with hypoxia (HCC)  Bruce [name] is a 78 year old male with history of HFrEF LVEF 40-45% hypertension, anxiety, hyperlipidemia, GERD, CAD, PAD, severe COPD, DVT and PE requiring thrombectomy on DOAC, blood loss anemia, NSTEMI, A-fib, pulmonary hypertension, Prostate cancer (s/p brachtherapy seeds), cor pulmonale, and MDD presenting with shortness of breath was admitted for acute hypoxic respiratory failure 2/2 HFrEF exacerbation admitted with volume overload complicated by R heart failure and hypotension.  Plan: Acute on chronic hypoxic respiratory failure 2/2 HFrEF exacerbation + influenza Biventricular HFrEF Cor pulmonale Unfortunately some trouble with ongoing diuresis 02/14 due to hypotension requiring  PM furosemide and sildenafil to be held, and he required two doses of midodrine. I do not feel that aggressive diuresis will be tolerated by the patient despite remaining clinically hypervolemic. His renal function is back to baseline with holding of these medications as well, suggesting  to me that he was overdiuresed. He is roughly at home supplemental O2 settings. Net -2.1L with 1.275L UOP 02/14L, weight -0.9 kg over last 24h. Subjective leg tightness since unna boots were placed; distal extremities are warm.  Plan: -HF team consulted, now signed off -Continue farixga 10 mg, ranexa 500 mg BID -Transition to torsemide 20 mg daily -Resume sildenafil 20 mg TID -Stright I's & O's, daily weights -Telemetry monitoring  -Would not restart home losartan 12.5 mg daily   Influenza A Remains afebrile, no leukocytosis, and roughly on home supplemental O2 settings. Low suspicion for new systemic infectious response to influenza. Plan: -Continue Tamiflu 30 mg BID for 2 more days -Fluticasone nasal spray, 1 spray each nare daily -Robitussin 5 mL PO q4h PRN cough   Elevated serum creatinine Resolved with holding intensive diuresis, losartan, sildenafil; again suspect bump was 2/2 new medications a few nights ago.  Plan: -Trend renal function -Continue to hold losartan   PAF Remains in  NSR on exam. 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 Home regiment of Albuterol and Stiolto Respimat. He did bring his stiolto to the hospital with him and voiced concerns this morning that this has since gone missing which is upsetting as he just recently purchased it. Plan: -Resume home sildenafil 20 mg TID  -Continue brovana BID, pulmicort BID, duoneb q6h PRN, yupelri daily -Continue home eliquis    Prolonged QTc Plan: -Trend potassium, magnesium, calcium; replete as indicated   MDD GAD Patient has received this only once since admission. Recommend adjusting dose frequency at discharge. Plan: -Continue home xanax 0.5 mg BID as needed (decreased from TID dosing)   Restless Leg Syndrome Plan: -Continue home ropinerole 1 mg daily at bedtime  Best  Practice: Diet: Cardiac diet IVF: None VTE: Eliquis Code: DNR/DNI AB: None DISPO: Anticipated discharge in 1-2 days to Home pending  medical stability .  Signature: Champ Mungo, D.O.  Internal Medicine Resident, PGY-2 [facility]  Pager: [phone]   Please contact the on call pager after 5 pm and on weekends at [phone].

## 2023-04-20 NOTE — Progress Notes (Addendum)
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   He continues to report improvement in dyspnea. He denies worsening dyspnea at rest, but does have exertional dyspnea. He denies chest pain, fevers, chills, myalgias. He has a headache this morning.   Objective:  Vital signs in last 24 hours: Vitals:   04/20/23 0107 04/20/23 0400 04/20/23 0704 04/20/23 0736  BP: 97/66  94/63 (!) 96/57  Pulse: 72  72 71  Resp: 14 (!) 22 18 (!) 24  Temp: 97.6 F (36.4 C)  99.4 F (37.4 C) 99 F (37.2 C)  TempSrc: Oral  Oral Oral  SpO2: 95%  (!) 89% 95%  Weight:   70.2 kg   Height:       Physical Exam: General:NAD, appears chronically ill  Cardiac:RRR, no murmurs auscultated  Pulmonary: reduced breath sounds bilaterally, no crackles, on 6L supplemental O2 with SpO2 saturation of 90-91% Neuro:awake, alert  ZOX:WRUEAV to appreciate edema with UNNA boots placed  Skin:warm and dry  Psych: normal mood and affect       Latest Ref Rng & Units 04/20/2023    2:53 AM 04/19/2023    5:56 AM 04/18/2023    4:24 PM  CBC  WBC 4.0 - 10.5 K/uL 5.8  5.6  4.3   Hemoglobin 13.0 - 17.0 g/dL 40.9  81.1  91.4   Hematocrit 39.0 - 52.0 % 36.0  39.5  38.1   Platelets 150 - 400 K/uL 198  208  199        Latest Ref Rng & Units 04/20/2023    2:53 AM 04/19/2023    5:56 AM 04/18/2023    4:24 PM  CMP  Glucose 70 - 99 mg/dL 782  84    BUN 8 - 23 mg/dL 20  16    Creatinine 9.56 - 1.24 mg/dL 2.13  0.86  5.78   Sodium 135 - 145 mmol/L 133  135    Potassium 3.5 - 5.1 mmol/L 3.8  3.4    Chloride 98 - 111 mmol/L 98  101    CO2 22 - 32 mmol/L 26  22     Calcium 8.9 - 10.3 mg/dL 8.5  8.9    Total Protein 6.5 - 8.1 g/dL  6.8    Total Bilirubin 0.0 - 1.2 mg/dL  1.2    Alkaline Phos 38 - 126 U/L  107    AST 15 - 41 U/L  36    ALT 0 - 44 U/L  25       Assessment/Plan:  Principal Problem:   Acute decompensated heart failure (HCC) Active Problems:   PAF (paroxysmal atrial fibrillation) (HCC)   Pulmonary HTN, group IV   Prolonged Q-T interval on ECG   Influenza A   Cor pulmonale (chronic) (HCC)   Acute on chronic respiratory failure with hypoxia (HCC)  Acute on chronic hypoxic respiratory failure 2/2 HFrEF exacerbation + influenza Biventricular HFrEF Cor-Pulmonale Patient presented with acute hypoxic respiratory failure due to a HFrEF exacerbation and influenza infection. Unable to appreciate pitting edema today due to [facility] boots placed. His BP was 96/57 with a MAP of 70. He does nt appear to be in cardiogenic shock  at this time.  Patient will require careful diuresis in setting of known RHF and close monitoring for any progression to cardiogenic shock.  Plan: -HF team consulted: appreciate recommendations.  -Continue farixga 10 mg, holding losartan -On home Ranexa 500mg  BID  -Strict Ins and outs -Daily standing weights -Continue Telemetry  -IV lasix 60 mg BID    Influenza A Influenza A positive in emergency department, likely contributing to his respiratory failure as well.  Plan: - Continue Tamiflu 75 mg BID for 5 days    Elevation in Serum Creatinine Stable, appears that his losartan was started again last night -Recommend holding losartan, contacted HF team, will d/c losartan for today  -BMP tomorrow morning    Fall I suspect that patient's fall was due to orthostatic hypotension and/or hypoxia due to current acute on chronic hypoxic respiratory failure. Left hip XR negative for fracture. -Ordered orthostatic vitals -Will monitor for arrhthymias on telemetry  -PT/OT   PAF Remains in  NSR on exam, on a home regimen  of Digoxin, Amiodarone, Eliquis.  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 Home regiment of Albuterol and Stiolto Respimat Plan: - Sildenafil 20mg  TID  - Continue home Albuterol and Stiolto Respimat - Continue Eliquis    Prolonged Qtc EKG reveals a Qtc of 493 -Will monitor K, Mag, Ca -K and Mag supplemented today    MDD GAD Not on SSRI. On home regimen of Xanax 0.5mg  TID PRN - Xanax 0.5 mg BID prn to avoid withdrawal    Restless Leg Syndrome - Ropinerole 1mg  at bedtime    Resolved Problems:  -Elevated Troponin  __________________________________  Code Status: DNR/DNI VTE Prophylaxis:eliquis  Diet:HH IVF:N/A Barriers to Discharge:Medical treatment  Dispo: Anticipated discharge in approximately 2 day(s).   Faith Rogue, DO 04/20/2023, 9:26 AM Pager: [phone] After 5pm on weekdays and 1pm on weekends: On Call pager [phone]

## 2023-04-20 NOTE — Care Management Important Message (Signed)
Important Message  Patient Details  Name: Bruce [name] MRN: [id number] Date of Birth: May 08, 1945   Important Message Given:  Yes - Medicare IM     Renie Ora 04/20/2023, 10:47 AM

## 2023-04-20 NOTE — Consult Note (Signed)
WOC Nurse Consult Note: Reason for Consult: Requested to assess the right ankle wound. The pt has unna boot and I am not able to assess. Sent it a secure chat to warn the WOC team on the next change Monday.  WOC team will follow on MON after Unna boot change. Please reconsult if further assistance is needed. Thank-you,  Denyse Amass BSN, RN, [employer], WOC  (Pager: [phone])

## 2023-04-20 NOTE — Progress Notes (Signed)
Advanced Heart Failure Rounding Note  Cardiologist: Adrian Blackwater, MD  Chief Complaint: Hypoxia Subjective:    3.3L UOP (net neg 1.7L) with IV Lasix 40/60 mg. Weight not performed. Cr stable at 1.3 SBP soft overnight in 90s  Feeling well this morning. SOB at rest improved, still fatigued with persistent dyspnea.   Objective:    Weight Range: 70.2 kg Body mass index is 20.42 kg/m.   Vital Signs:   Temp:  [97.5 F (36.4 C)-99.4 F (37.4 C)] 99 F (37.2 C) (02/14 0736) Pulse Rate:  [70-88] 71 (02/14 0736) Resp:  [14-33] 24 (02/14 0736) BP: (94-127)/(57-88) 96/57 (02/14 0736) SpO2:  [89 %-96 %] 95 % (02/14 0736) Weight:  [70.2 kg] 70.2 kg (02/14 0704) Last BM Date : 04/19/23  Weight change: Filed Weights   04/19/23 1251 04/20/23 0704  Weight: 70.2 kg 70.2 kg   Intake/Output:   Intake/Output Summary (Last 24 hours) at 04/20/2023 0752 Last data filed at 04/19/2023 2152 Gross per 24 hour  Intake 1608 ml  Output 3350 ml  Net -1742 ml    Physical Exam    General: Elderly, frail appearing. No distress on [place] Cardiac: JVP ~10cm. S1 and S2 present. No murmurs or rub. Abdomen: Soft, non-tender, non-distended. + BS. Extremities: Warm and dry. No rash, cyanosis.  1+ peripheral edema. +unna boots Neuro: Alert and oriented x3. Affect pleasant. Moves all extremities without difficulty.  Telemetry   SR in 70s (personally reviewed)  EKG    NSR 75 bpm (personally reviewed)  Labs    CBC Recent Labs    04/18/23 1139 04/18/23 1624 04/19/23 0556 04/20/23 0253  WBC 4.3   < > 5.6 5.8  NEUTROABS 3.3  --   --   --   HGB 12.0*   < > 13.0 12.0*  HCT 37.1*   < > 39.5 36.0*  MCV 99.5   < > 98.8 96.5  PLT 206   < > 208 198   < > = values in this interval not displayed.   Basic Metabolic Panel Recent Labs    09/81/19 1624 04/19/23 0556 04/20/23 0253  NA  --  135 133*  K  --  3.4* 3.8  CL  --  101 98  CO2  --  22 26  GLUCOSE  --  84 103*  BUN  --  16 20   CREATININE 1.13 1.30* 1.29*  CALCIUM  --  8.9 8.5*  MG 2.0  --  1.9   Liver Function Tests Recent Labs    04/18/23 1139 04/19/23 0556  AST 31 36  ALT 20 25  ALKPHOS 107 107  BILITOT 1.1 1.2  PROT 6.2* 6.8  ALBUMIN 2.9* 3.1*   No results for input(s): "LIPASE", "AMYLASE" in the last 72 hours. Cardiac Enzymes No results for input(s): "CKTOTAL", "CKMB", "CKMBINDEX", "TROPONINI" in the last 72 hours.  BNP: BNP (last 3 results) Recent Labs    06/21/22 1258 01/15/23 1538 04/18/23 1139  BNP 367.7* 444.3* 1,456.0*   ProBNP (last 3 results) No results for input(s): "PROBNP" in the last 8760 hours.  D-Dimer No results for input(s): "DDIMER" in the last 72 hours. Hemoglobin A1C No results for input(s): "HGBA1C" in the last 72 hours. Fasting Lipid Panel No results for input(s): "CHOL", "HDL", "LDLCALC", "TRIG", "CHOLHDL", "LDLDIRECT" in the last 72 hours. Thyroid Function Tests No results for input(s): "TSH", "T4TOTAL", "T3FREE", "THYROIDAB" in the last 72 hours.  Invalid input(s): "FREET3"  Other results:  Imaging  DG HIP UNILAT WITH PELVIS 2-3 VIEWS LEFT Result Date: 04/19/2023 CLINICAL DATA:  Fall, left hip pain EXAM: DG HIP (WITH OR WITHOUT PELVIS) 2-3V LEFT COMPARISON:  04/12/2022 FINDINGS: No acute bony abnormality. Specifically, no fracture, subluxation, or dislocation. Early symmetric degenerative changes in the hips bilaterally. 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 IMPRESSION: No acute bony abnormality. Electronically Signed   By: Charlett Nose M.D.   On: 04/19/2023 21:04   Medications:    Scheduled Medications:  amiodarone  100 mg Oral Daily   apixaban  5 mg Oral BID   arformoterol  15 mcg Nebulization BID   And   umeclidinium bromide  1 puff Inhalation Daily   cholecalciferol  1,000 Units Oral Daily   dapagliflozin propanediol  10 mg Oral Daily   digoxin  0.125 mg Oral Daily   fluticasone  1 spray Each Nare Daily   furosemide  60 mg Intravenous  BID   gabapentin  100 mg Oral TID   losartan  12.5 mg Oral QHS   multivitamin with minerals  1 tablet Oral Daily   omega-3 acid ethyl esters  1 g Oral Daily   oseltamivir  75 mg Oral BID   pantoprazole  40 mg Oral Daily   potassium chloride  40 mEq Oral Once   ranolazine  500 mg Oral BID   rOPINIRole  1 mg Oral QHS   rosuvastatin  10 mg Oral Daily   sildenafil  20 mg Oral TID    Infusions:  magnesium sulfate bolus IVPB      PRN Medications: albuterol, ALPRAZolam  Patient Profile   Bruce [name] is a 78 y.o. 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  Assessment/Plan    1. Acute on Chronic Biventricular HFrEF, predominantly RV Failure  - R/LHC (2/24): c/w severe NICM. Minimal CAD, moderate to severe PAH with evidence of cor pulmonale - Echo (11/24) unchanged from prior: EF 40-45%, flattened septum RV failure - NYHA VI on admission. Volume overloaded. - place UNNA boots - continue Lasix to 60 mg bid. Likely can transition to PO tomorrow.  - continue Farxiga 10 mg daily. - continue digoxin 0.125 mg daily. - continue sildenafil 20 mg tid - continue losartan 12.5 mg daily at bedtime - prev not on spiro for elevated K   2. 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 The component of thrombus in the right main PA has essentially resolved since the prior CTA with a small amount of anterior chronic mural thrombus remaining. Distally, occlusive chronic thrombus remains in the interlobar PA extending into RLL and RML pulmonary arteries - PFTs 12/24  with severe COPD. DLCO 10.2 - continue sildenafil 20 mg tid - continue eliquis 5 bid   3. Acute on Chronic Hypoxic Respiratory Failure - PFTs 12/24 with severe COPD, DLCO 10.2 - COPD at b/l + submassive PE - 3L O2 at home - Influenza A + On Tamiflu - now at baseline O2 supp   4. PAF  - NSR on tele - continue amio 100 mg daily. Not ideal with  COPD.  - continue Eliquis 5 mg bid.  5. AKI  - cardio-renal in the setting of volume overload - sCr stable at 1.3 - diuresis as above  6. CAD - minimal on cath in 2/24 - no asa with eliquis - continue crestor 10 mg daily   7. Tobacco abuse - Still smoking - Discussed cessation    8. LE claudication - prev ordered ABIs, but did not go to appointment   9. Fall - h/o sciatic nerve pain in hips - with pain, BLE edema, and claudication had mechanical fall at home - L hip XR stable   10. Hypokalemia - supp to goal k>4  Length of Stay: 2  [place] Verbie Babic, NP  04/20/2023, 7:52 AM  Advanced Heart Failure Team Pager [phone] (M-F; 7a - 5p)  Please contact [facility] for night-coverage after hours (5p -7a ) and weekends on [email or web address]

## 2023-04-21 DIAGNOSIS — J9621 Acute and chronic respiratory failure with hypoxia: Secondary | ICD-10-CM | POA: Diagnosis not present

## 2023-04-21 DIAGNOSIS — I509 Heart failure, unspecified: Secondary | ICD-10-CM | POA: Diagnosis not present

## 2023-04-21 DIAGNOSIS — J101 Influenza due to other identified influenza virus with other respiratory manifestations: Secondary | ICD-10-CM | POA: Diagnosis not present

## 2023-04-21 LAB — BASIC METABOLIC PANEL
Anion gap: 9 (ref 5–15)
BUN: 23 mg/dL (ref 8–23)
CO2: 24 mmol/L (ref 22–32)
Calcium: 8.6 mg/dL — ABNORMAL LOW (ref 8.9–10.3)
Chloride: 99 mmol/L (ref 98–111)
Creatinine, Ser: 1.19 mg/dL (ref 0.61–1.24)
GFR, Estimated: >60 mL/min (ref >60)
Glucose, Bld: 88 mg/dL (ref 70–99)
Potassium: 4.1 mmol/L (ref 3.5–5.1)
Sodium: 132 mmol/L — ABNORMAL LOW (ref 135–145)

## 2023-04-21 LAB — MAGNESIUM: Magnesium: 2.2 mg/dL (ref 1.7–2.4)

## 2023-04-21 MED ORDER — TORSEMIDE 20 MG PO TABS
20.0000 mg | ORAL_TABLET | Freq: Every day | ORAL | Status: DC
  Administered 2023-04-21: 20 mg via ORAL
  Filled 2023-04-21 (×2): qty 1

## 2023-04-21 MED ORDER — GUAIFENESIN-DM 100-10 MG/5ML PO SYRP
5.0000 mL | ORAL_SOLUTION | ORAL | Status: DC | PRN
  Administered 2023-04-21: 5 mL via ORAL
  Filled 2023-04-21 (×2): qty 5

## 2023-04-21 MED ORDER — SILDENAFIL CITRATE 20 MG PO TABS
20.0000 mg | ORAL_TABLET | Freq: Three times a day (TID) | ORAL | Status: DC
  Administered 2023-04-21 – 2023-04-25 (×12): 20 mg via ORAL
  Filled 2023-04-21 (×15): qty 1

## 2023-04-21 NOTE — Plan of Care (Signed)
  Problem: Education: Goal: Knowledge of General Education information will improve Description: Including pain rating scale, medication(s)/side effects and non-pharmacologic comfort measures Outcome: Progressing   Problem: Health Behavior/Discharge Planning: Goal: Ability to manage health-related needs will improve Outcome: Progressing   Problem: Clinical Measurements: Goal: Ability to maintain clinical measurements within normal limits will improve Outcome: Progressing Goal: Will remain free from infection Outcome: Progressing Goal: Diagnostic test results will improve Outcome: Progressing Goal: Respiratory complications will improve Outcome: Progressing Goal: Cardiovascular complication will be avoided Outcome: Progressing   Problem: Activity: Goal: Risk for activity intolerance will decrease Outcome: Progressing   Problem: Coping: Goal: Level of anxiety will decrease Outcome: Progressing   Problem: Safety: Goal: Ability to remain free from injury will improve Outcome: Progressing   Problem: Skin Integrity: Goal: Risk for impaired skin integrity will decrease Outcome: Progressing   

## 2023-04-21 NOTE — Plan of Care (Signed)

## 2023-04-21 NOTE — Evaluation (Signed)
Occupational Therapy Evaluation Patient Details Name: Bruce [name] MRN: [id number] DOB: 1945/03/25 Today's Date: 04/21/2023   History of Present Illness   Patient is 78 y.o. male presenting 04/18/23 with progressive dyspnea and reported fall in his bathroom. Admitted with acute decompensated heart failure. Pt +influenza. PMH: prostate cancer, CHF, COPD, coronary artery disease, GERD, HTN, MI, PVD, HLD, PAD, Hx of DVT & PE, NSTEMI, Afib, pulmonary hypertension, medical noncompliance     Clinical Impressions Pt admitted for concerns listed above. PTA pt reported that he was independent with all ADL's and IADL's. At this time, pt presents with increased weakness and balance deficits, as well as increased O2 needs, up to 5L spO2. He is min A for functional mobility, as well as CGA to min A for all BADL's due to weakness and balance. Pt requires multiple rest breaks and increased time due to SoB. Recommending further skilled OT, however pt reporting that he will not go back to any rehab facility. Acute OT will continue to follow.      If plan is discharge home, recommend the following:   A little help with walking and/or transfers;A little help with bathing/dressing/bathroom;Assistance with cooking/housework;Assist for transportation;Help with stairs or ramp for entrance     Functional Status Assessment   Patient has had a recent decline in their functional status and demonstrates the ability to make significant improvements in function in a reasonable and predictable amount of time.     Equipment Recommendations   Tub/shower bench     Recommendations for Other Services         Precautions/Restrictions   Precautions Precautions: Fall Recall of Precautions/Restrictions: Intact Precaution/Restrictions Comments: watch SpO2 Restrictions Weight Bearing Restrictions Per Provider Order: No     Mobility Bed Mobility               General bed mobility comments: up in  recliner on entry    Transfers Overall transfer level: Needs assistance Equipment used: Rolling walker (2 wheels) Transfers: Sit to/from Stand Sit to Stand: Min assist           General transfer comment: Min A to push up from chair, cuing for hand placement.      Balance Overall balance assessment: Needs assistance, History of Falls Sitting-balance support: Feet supported, No upper extremity supported Sitting balance-Leahy Scale: Good     Standing balance support: Bilateral upper extremity supported, During functional activity, Reliant on assistive device for balance Standing balance-Leahy Scale: Poor Standing balance comment: reliant on RW                           ADL either performed or assessed with clinical judgement   ADL Overall ADL's : Needs assistance/impaired Eating/Feeding: Independent;Sitting   Grooming: Contact guard assist;Standing   Upper Body Bathing: Contact guard assist;Sitting   Lower Body Bathing: Minimal assistance;Sit to/from stand;Sitting/lateral leans   Upper Body Dressing : Set up;Sitting   Lower Body Dressing: Minimal assistance;Sit to/from stand;Sitting/lateral leans   Toilet Transfer: Minimal assistance;Ambulation   Toileting- Clothing Manipulation and Hygiene: Contact guard assist;Minimal assistance;Sitting/lateral lean;Sit to/from stand       Functional mobility during ADLs: Minimal assistance;Rolling walker (2 wheels) General ADL Comments: Pt requiring cga to min A for most ADL's due to weakness coming to standing and balance concerns in standing     Vision Baseline Vision/History: 0 No visual deficits Ability to See in Adequate Light: 0 Adequate Patient Visual Report: No change from baseline  Vision Assessment?: No apparent visual deficits     Perception         Praxis         Pertinent Vitals/Pain Pain Assessment Pain Assessment: 0-10 Pain Score: 6  Pain Location: Bil legs Pain Descriptors / Indicators:  Grimacing, Shooting, Sharp, Sore Pain Intervention(s): Limited activity within patient's tolerance, Monitored during session, Repositioned     Extremity/Trunk Assessment Upper Extremity Assessment Upper Extremity Assessment: Generalized weakness   Lower Extremity Assessment Lower Extremity Assessment: Defer to PT evaluation   Cervical / Trunk Assessment Cervical / Trunk Assessment: Kyphotic   Communication Communication Communication: No apparent difficulties   Cognition Arousal: Alert Behavior During Therapy: WFL for tasks assessed/performed Cognition: No apparent impairments                               Following commands: Intact       Cueing  General Comments   Cueing Techniques: Verbal cues;Tactile cues      Exercises     Shoulder Instructions      Home Living Family/patient expects to be discharged to:: Private residence Living Arrangements: Non-relatives/Friends   Type of Home: Mobile home Home Access: Stairs to enter Entrance Stairs-Number of Steps: 7-8 Entrance Stairs-Rails: None Home Layout: One level     Bathroom Shower/Tub: Chief Strategy Officer: Standard     Home Equipment: Agricultural consultant (2 wheels);Cane - single point          Prior Functioning/Environment Prior Level of Function : Independent/Modified Independent;Driving;History of Falls (last six months)             Mobility Comments: Pt intermittently uses RW or SPC with ambulation. Uses power wheelchair in grocery stores ADLs Comments: Pt says he was independent in all ADLs.    OT Problem List: Decreased strength;Decreased range of motion;Decreased activity tolerance;Impaired balance (sitting and/or standing);Decreased safety awareness;Decreased knowledge of use of DME or AE;Cardiopulmonary status limiting activity;Impaired UE functional use   OT Treatment/Interventions: Self-care/ADL training;Therapeutic exercise;Energy conservation;DME and/or AE  instruction;Therapeutic activities;Patient/family education;Balance training      OT Goals(Current goals can be found in the care plan section)   Acute Rehab OT Goals Patient Stated Goal: To go home OT Goal Formulation: With patient Time For Goal Achievement: 04/21/23 Potential to Achieve Goals: Good ADL Goals Pt Will Perform Grooming: with modified independence;standing Pt Will Perform Lower Body Bathing: with modified independence;sit to/from stand;sitting/lateral leans Pt Will Perform Lower Body Dressing: with modified independence;sitting/lateral leans;sit to/from stand Pt Will Transfer to Toilet: with modified independence;ambulating Pt Will Perform Toileting - Clothing Manipulation and hygiene: with modified independence;sitting/lateral leans;sit to/from stand   OT Frequency:  Min 2X/week    Co-evaluation              AM-PAC OT "6 Clicks" Daily Activity     Outcome Measure Help from another person eating meals?: None Help from another person taking care of personal grooming?: A Little Help from another person toileting, which includes using toliet, bedpan, or urinal?: A Little Help from another person bathing (including washing, rinsing, drying)?: A Little Help from another person to put on and taking off regular upper body clothing?: A Little Help from another person to put on and taking off regular lower body clothing?: A Little 6 Click Score: 19   End of Session Equipment Utilized During Treatment: Oxygen;Rolling walker (2 wheels) Nurse Communication: Mobility status  Activity Tolerance: Patient tolerated treatment well  Patient left: in chair;with call bell/phone within reach;with chair alarm set  OT Visit Diagnosis: Unsteadiness on feet (R26.81);Other abnormalities of gait and mobility (R26.89);Muscle weakness (generalized) (M62.81)                Time: 9604-5409 OT Time Calculation (min): 21 min Charges:  OT General Charges $OT Visit: 1 Visit OT  Evaluation $OT Eval Moderate Complexity: 1 Mod  Aimee Timmons Bing Plume, OTR/L [place] [facility]  Latunya Kissick Elane Bing Plume 04/21/2023, 2:06 PM

## 2023-04-21 NOTE — Evaluation (Signed)
Physical Therapy Evaluation Patient Details Name: Bruce [name] MRN: [id number] DOB: [birth year] Today's Date: 04/21/2023  History of Present Illness  Patient is 78 y.o. male presenting 04/18/23 with progressive dyspnea and reported fall in his bathroom. Admitted with acute decompensated heart failure. Pt +influenza. PMH: prostate cancer, CHF, COPD, coronary artery disease, GERD, HTN, MI, PVD, HLD, PAD, Hx of DVT & PE, NSTEMI, Afib, pulmonary hypertension, medical noncompliance  Clinical Impression  Pt is a 78 y.o. male presenting on 04/18/23 with above conditions and deficits below, see PT Problem List. PTA pt lived mod I with intermittent RW use and lived with a friend. Currently pt is min A for transfers and ambulation requiring cueing for proper sequencing, hand placement, initiation, and safety. Pt is supervision with bed mobility with HOB elevated. Pt reported dizziness throughout the session and required a multiple minute rest break after sitting EOB. Pt displayed deficits in LE strength, functional mobility, gait, balance, and activity tolerance and would benefit from continued acute PT to address these impairments. Pt would benefit from less intense rehab < 3 hours s/p d/c due to pt's lack of assistance at home, risk for falls, decreased activity tolerance, and current condition. Will continue to follow acutely.   Pt's BP Supine beginning: 127/85 Standing : 123/74 Seated toilet: 110/60     Pt's SpO2 varied between 88-95% on 3 L [place] with activity.       If plan is discharge home, recommend the following: A little help with walking and/or transfers;A little help with bathing/dressing/bathroom;Assistance with cooking/housework;Assist for transportation;Help with stairs or ramp for entrance   Can travel by private vehicle   Yes    Equipment Recommendations BSC/3in1  Recommendations for Other Services       Functional Status Assessment Patient has had a recent decline in their  functional status and demonstrates the ability to make significant improvements in function in a reasonable and predictable amount of time.     Precautions / Restrictions Precautions Precautions: Fall Recall of Precautions/Restrictions: Intact Restrictions Weight Bearing Restrictions Per Provider Order: No      Mobility  Bed Mobility Overal bed mobility: Needs Assistance Bed Mobility: Supine to Sit     Supine to sit: Supervision, HOB elevated, Used rails     General bed mobility comments: Pt comes to EOB requiring increased time, using forward momentum, and use of rails.    Transfers Overall transfer level: Needs assistance Equipment used: Rolling walker (2 wheels) Transfers: Sit to/from Stand Sit to Stand: Min assist           General transfer comment: Pt required cueing for hand placement on sitting surface rather than RW and required bil UE push on the bed to stand. Pt needed additional time and fwd trunk flexion to stand    Ambulation/Gait   Gait Distance (Feet): 12 Feet Assistive device: Rolling walker (2 wheels) Gait Pattern/deviations: Step-through pattern, Decreased stride length, Shuffle, Trunk flexed, Drifts right/left Gait velocity: reduced Gait velocity interpretation: <1.8 ft/sec, indicate of risk for recurrent falls Pre-gait activities: Pt marches x5 bil LEs. Pt appears more comfortable accepting weight on L LE than R General Gait Details: Pt walks with short, slow, and shuffling steps to bathroom. Pt required cueing for RW management, upright trunk, and proper sequencing.  Stairs            Wheelchair Mobility     Tilt Bed    Modified Rankin (Stroke Patients Only)       Balance Overall balance assessment:  Needs assistance, History of Falls Sitting-balance support: Feet supported, No upper extremity supported Sitting balance-Leahy Scale: Good Sitting balance - Comments: Pt sits EOB with anterior lean. With reaching outside BOS pt  reaches further to his R than L s/t L hip/"sciatica" pain. No LOB pt reports increased dizziness Postural control:  (Anterior lean) Standing balance support: Bilateral upper extremity supported, During functional activity, Reliant on assistive device for balance Standing balance-Leahy Scale: Poor Standing balance comment: Pt reliant on bil UE support and maintains flexed trunk. When cued to maintain upright trunk posture, pt reports increased dizziness                             Pertinent Vitals/Pain Pain Assessment Pain Assessment: Faces Faces Pain Scale: Hurts even more Pain Location: Bil legs Pain Descriptors / Indicators: Grimacing, Shooting, Sharp, Sore Pain Intervention(s): Monitored during session    Home Living Family/patient expects to be discharged to:: Private residence Living Arrangements: Non-relatives/Friends Available Help at Discharge: Other (Comment) (None) Type of Home: Mobile home Home Access: Stairs to enter Entrance Stairs-Rails: None Entrance Stairs-Number of Steps: 7-8   Home Layout: One level Home Equipment: Agricultural consultant (2 wheels);Cane - single point      Prior Function Prior Level of Function : Independent/Modified Independent;Driving;History of Falls (last six months)             Mobility Comments: Pt intermittently uses RW or SPC with ambulation. Uses power wheelchair in grocery stores ADLs Comments: Pt says he was independent in all ADLs.     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 No hearing aids seen in pt's room.)    Cognition Arousal: Alert Behavior During Therapy: WFL for tasks assessed/performed   PT - Cognitive impairments: Safety/Judgement                       PT - Cognition Comments: Pt has decreased safety awareness as seen with prefered use of SPC s/p fall on slippery slope earlier this year. Pt reports use of SPC in hospital for trips to the bathroom even though he reports dizziness and instability while standing. Following commands: Intact       Cueing Cueing Techniques: Verbal cues, Tactile cues     General Comments General comments (skin integrity, edema, etc.): See PT comments for BP readings    Exercises General Exercises - Lower Extremity Long Arc Quad: AROM, 10 reps, Seated   Assessment/Plan    PT Assessment Patient needs continued PT services  PT Problem List Decreased strength;Decreased range of motion;Decreased activity tolerance;Decreased balance;Decreased mobility;Decreased coordination;Decreased knowledge of use of DME;Decreased safety awareness;Cardiopulmonary status limiting activity;Impaired sensation;Pain       PT Treatment Interventions DME instruction;Gait training;Stair training;Functional mobility training;Therapeutic activities;Therapeutic exercise;Balance training;Neuromuscular re-education;Patient/family education    PT Goals (Current goals can be found in the Care Plan section)  Acute Rehab PT Goals Patient Stated Goal: Improve mobility PT Goal Formulation: With patient Time For Goal Achievement:  05/05/23 Potential to Achieve Goals: Good    Frequency Min 1X/week     Co-evaluation               AM-PAC PT "6 Clicks" Mobility  Outcome Measure Help needed turning from your back to your side while in a flat bed without using bedrails?: A Little Help needed moving  from lying on your back to sitting on the side of a flat bed without using bedrails?: A Little Help needed moving to and from a bed to a chair (including a wheelchair)?: A Little Help needed standing up from a chair using your arms (e.g., wheelchair or bedside chair)?: A Little Help needed to walk in hospital room?: Total Help needed climbing 3-5 steps with a railing? : Total 6 Click Score: 14    End of Session Equipment Utilized During Treatment: Gait belt;Oxygen Activity Tolerance: Patient limited by fatigue;Other (comment) (limited by dizziness with static & dynamic movements) Patient left: Other (comment) (Pt left on room's toilet with instructions to use bathroom call option to call his nurse to return to recliner when finished. Pt responded with verbal understanding.) Nurse Communication: Other (comment) (notified nursing pt is currently on toilet and will call for them when he is done) PT Visit Diagnosis: Unsteadiness on feet (R26.81);Other abnormalities of gait and mobility (R26.89);Muscle weakness (generalized) (M62.81);History of falling (Z91.81);Difficulty in walking, not elsewhere classified (R26.2);Pain Pain - Right/Left: Right Pain - part of body: Leg    Time: 6578-4696 PT Time Calculation (min) (ACUTE ONLY): 34 min   Charges:   PT Evaluation $PT Eval Moderate Complexity: 1 Mod PT Treatments $Therapeutic Activity: 8-22 mins PT General Charges $$ ACUTE PT VISIT: 1 Visit        [address], SPT   [place] 04/21/2023, 11:52 AM

## 2023-04-21 NOTE — Plan of Care (Signed)
  Problem: Nutrition: Goal: Adequate nutrition will be maintained Outcome: Completed/Met   Problem: Elimination: Goal: Will not experience complications related to bowel motility Outcome: Completed/Met Goal: Will not experience complications related to urinary retention Outcome: Completed/Met   Problem: Pain Managment: Goal: General experience of comfort will improve and/or be controlled Outcome: Completed/Met

## 2023-04-22 LAB — BASIC METABOLIC PANEL
Anion gap: 12 (ref 5–15)
BUN: 25 mg/dL — ABNORMAL HIGH (ref 8–23)
CO2: 25 mmol/L (ref 22–32)
Calcium: 8.9 mg/dL (ref 8.9–10.3)
Chloride: 96 mmol/L — ABNORMAL LOW (ref 98–111)
Creatinine, Ser: 1.37 mg/dL — ABNORMAL HIGH (ref 0.61–1.24)
GFR, Estimated: 54 mL/min — ABNORMAL LOW (ref >60)
Glucose, Bld: 99 mg/dL (ref 70–99)
Potassium: 3.6 mmol/L (ref 3.5–5.1)
Sodium: 133 mmol/L — ABNORMAL LOW (ref 135–145)

## 2023-04-22 LAB — MAGNESIUM: Magnesium: 1.9 mg/dL (ref 1.7–2.4)

## 2023-04-22 MED ORDER — POTASSIUM CHLORIDE CRYS ER 20 MEQ PO TBCR
40.0000 meq | EXTENDED_RELEASE_TABLET | Freq: Once | ORAL | Status: AC
  Administered 2023-04-22: 40 meq via ORAL
  Filled 2023-04-22: qty 2

## 2023-04-22 MED ORDER — LIDOCAINE 5 % EX PTCH
1.0000 | MEDICATED_PATCH | CUTANEOUS | Status: DC
  Administered 2023-04-22 – 2023-04-24 (×3): 1 via TRANSDERMAL
  Filled 2023-04-22 (×3): qty 1

## 2023-04-22 MED ORDER — MAGNESIUM SULFATE 2 GM/50ML IV SOLN
2.0000 g | Freq: Once | INTRAVENOUS | Status: AC
  Administered 2023-04-22: 2 g via INTRAVENOUS
  Filled 2023-04-22: qty 50

## 2023-04-22 NOTE — Progress Notes (Addendum)
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   Today, patient is reporting L hip pain and a headache. He reports that tylenol does not help with pain or headache, he also stated that he has refused to take the tylenol during this hospitalization. We briefly spoke on SNF placement and patient was addiment about not going to a SNF facility. He stated that he went a SNF in the past and is refusing to return. Patient denied difficulty breathing and chest pain.   Objective:  Vital signs in last 24 hours: Vitals:   04/21/23 2032 04/22/23 0006 04/22/23 0515 04/22/23 0745  BP:  106/64 100/70   Pulse: 73     Resp: (!) 22 20 20  (!) 21  Temp:  98.2 F (36.8 C) 98.2 F (36.8 C)   TempSrc:  Oral Oral   SpO2:  (!) 88% 92%   Weight:   69.8 kg   Height:       UOP: 1,251  Physical Exam: General:NAD, laying in bed watching TV  Cardiac:RRR, no murmur appreciated  Pulmonary:normal work of breathing on 4L [place], unable to appreciate rales, rhonchi, or wheezing on exam  Neuro:awake, alert MSK: UNNA boots placed bilaterally  Skin:warm and dry      Latest Ref Rng & Units 04/20/2023    2:53 AM 04/19/2023    5:56 AM 04/18/2023    4:24 PM  CBC  WBC 4.0 - 10.5 K/uL 5.8  5.6  4.3   Hemoglobin 13.0 - 17.0 g/dL 01.0  27.2  53.6   Hematocrit 39.0 - 52.0 % 36.0  39.5  38.1   Platelets 150 - 400 K/uL 198  208  199        Latest Ref Rng & Units 04/22/2023    2:37 AM 04/21/2023    2:50 AM 04/20/2023    2:53 AM  CMP  Glucose 70 - 99 mg/dL 99  88  644   BUN 8 - 23 mg/dL 25  23  20     Creatinine 0.61 - 1.24 mg/dL 0.34  7.42  5.95   Sodium 135 - 145 mmol/L 133  132  133   Potassium 3.5 - 5.1 mmol/L 3.6  4.1  3.8   Chloride 98 - 111 mmol/L 96  99  98   CO2 22 - 32 mmol/L 25  24  26    Calcium 8.9 - 10.3 mg/dL 8.9  8.6  8.5      Assessment/Plan:  Principal Problem:   Acute decompensated heart failure (HCC) Active Problems:   PAF (paroxysmal atrial fibrillation) (HCC)   Pulmonary HTN, group IV   Prolonged Q-T interval on ECG   Influenza A   Cor pulmonale (chronic) (HCC)   Acute on chronic respiratory failure with hypoxia (HCC)  Acute on chronic hypoxic respiratory failure 2/2 HFrEF exacerbation + influenza Biventricular HFrEF Cor-Pulmonale Patient presented with acute hypoxic respiratory failure due to a HFrEF exacerbation and influenza infection. His hospitalization has been complicated by hypotension and R heart failure resulting in discontinuing IV lasix for diuresis. Volume exam today is stable, he does not appear volume overloaded today. Oxygenation is stable at  4LNC.  Patient is nearing discharge. PT recommended SNF and patient is refusing SNF placement, patient has a case worker for better housing who he will be in touch with.  Plan: -Will hold on this AM does of torsemide due to BP  -Continue farixga 10 mg, holding losartan -On home Ranexa 500mg  BID  -Strict Ins and outs -Daily standing weights -Continue Telemetry    Influenza A Influenza A positive in emergency department, likely contributing to his respiratory failure as well.  Plan: - Continue Tamiflu 30mg  BID   Elevation in Serum Creatinine Stable likely due to patient's diuresis  -Avoid nephrotoxic agents  -BMP daily     Fall I suspect that patient's fall was due to orthostatic hypotension and/or hypoxia due to current acute on chronic hypoxic respiratory failure. Left hip XR negative for fracture. -Will monitor for arrhthymias on telemetry  -PT/OT   PAF Remains in  NSR on exam, on a home  regimen of Digoxin, Amiodarone, Eliquis.  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 Home regiment of Albuterol and Stiolto Respimat Plan: - Sildenafil 20mg  TID  - Continue home Albuterol and Pulmicort  - Continue Eliquis    Prolonged Qtc EKG reveals a Qtc of 493 -Will monitor K, Mag, Ca -K and Mag supplemented today    MDD GAD Not on SSRI. On home regimen of Xanax 0.5mg  TID PRN - Xanax 0.5 mg BID prn to avoid withdrawal    Restless Leg Syndrome - Ropinerole 1mg  at bedtime    Resolved Problems:  -Elevated Troponin  __________________________________  Code Status: DNR/DNI VTE Prophylaxis:eliquis  Diet:HH IVF:N/A Barriers to Discharge:Medical treatment, SNF placement  Dispo: Anticipated discharge in approximately 2 day(s).   Faith Rogue, DO 04/22/2023, 8:50 AM Pager: [phone] After 5pm on weekdays and 1pm on weekends: On Call pager [phone]

## 2023-04-22 NOTE — Plan of Care (Signed)
  Problem: Education: Goal: Knowledge of General Education information will improve Description: Including pain rating scale, medication(s)/side effects and non-pharmacologic comfort measures Outcome: Progressing   Problem: Health Behavior/Discharge Planning: Goal: Ability to manage health-related needs will improve Outcome: Progressing   Problem: Clinical Measurements: Goal: Ability to maintain clinical measurements within normal limits will improve Outcome: Progressing Goal: Will remain free from infection Outcome: Progressing Goal: Diagnostic test results will improve Outcome: Progressing Goal: Respiratory complications will improve Outcome: Progressing Goal: Cardiovascular complication will be avoided Outcome: Progressing   Problem: Activity: Goal: Risk for activity intolerance will decrease Outcome: Progressing   Problem: Coping: Goal: Level of anxiety will decrease Outcome: Progressing   Problem: Safety: Goal: Ability to remain free from injury will improve Outcome: Progressing   Problem: Skin Integrity: Goal: Risk for impaired skin integrity will decrease Outcome: Progressing   

## 2023-04-23 ENCOUNTER — Ambulatory Visit: Payer: 59 | Admitting: Family

## 2023-04-23 DIAGNOSIS — I509 Heart failure, unspecified: Secondary | ICD-10-CM | POA: Diagnosis not present

## 2023-04-23 DIAGNOSIS — I272 Pulmonary hypertension, unspecified: Secondary | ICD-10-CM | POA: Diagnosis not present

## 2023-04-23 DIAGNOSIS — J101 Influenza due to other identified influenza virus with other respiratory manifestations: Secondary | ICD-10-CM | POA: Diagnosis not present

## 2023-04-23 DIAGNOSIS — J9621 Acute and chronic respiratory failure with hypoxia: Secondary | ICD-10-CM | POA: Diagnosis not present

## 2023-04-23 LAB — BASIC METABOLIC PANEL
Anion gap: 11 (ref 5–15)
BUN: 29 mg/dL — ABNORMAL HIGH (ref 8–23)
CO2: 26 mmol/L (ref 22–32)
Calcium: 9.1 mg/dL (ref 8.9–10.3)
Chloride: 98 mmol/L (ref 98–111)
Creatinine, Ser: 1.35 mg/dL — ABNORMAL HIGH (ref 0.61–1.24)
GFR, Estimated: 55 mL/min — ABNORMAL LOW (ref >60)
Glucose, Bld: 121 mg/dL — ABNORMAL HIGH (ref 70–99)
Potassium: 4.1 mmol/L (ref 3.5–5.1)
Sodium: 135 mmol/L (ref 135–145)

## 2023-04-23 LAB — MAGNESIUM: Magnesium: 2.3 mg/dL (ref 1.7–2.4)

## 2023-04-23 MED ORDER — PSEUDOEPHEDRINE HCL ER 120 MG PO TB12
120.0000 mg | ORAL_TABLET | Freq: Once | ORAL | Status: AC
  Administered 2023-04-23: 120 mg via ORAL
  Filled 2023-04-23: qty 1

## 2023-04-23 MED ORDER — ROSUVASTATIN CALCIUM 20 MG PO TABS
20.0000 mg | ORAL_TABLET | Freq: Every day | ORAL | Status: DC
  Administered 2023-04-24 – 2023-04-25 (×2): 20 mg via ORAL
  Filled 2023-04-23 (×2): qty 1

## 2023-04-23 MED ORDER — MUSCLE RUB 10-15 % EX CREA
TOPICAL_CREAM | CUTANEOUS | Status: DC | PRN
  Filled 2023-04-23: qty 85

## 2023-04-23 NOTE — Plan of Care (Signed)
  Problem: Education: Goal: Knowledge of General Education information will improve Description: Including pain rating scale, medication(s)/side effects and non-pharmacologic comfort measures Outcome: Progressing   Problem: Health Behavior/Discharge Planning: Goal: Ability to manage health-related needs will improve Outcome: Progressing   Problem: Clinical Measurements: Goal: Ability to maintain clinical measurements within normal limits will improve Outcome: Progressing Goal: Will remain free from infection Outcome: Progressing Goal: Diagnostic test results will improve Outcome: Progressing Goal: Respiratory complications will improve Outcome: Progressing Goal: Cardiovascular complication will be avoided Outcome: Progressing   Problem: Activity: Goal: Risk for activity intolerance will decrease Outcome: Progressing   Problem: Coping: Goal: Level of anxiety will decrease Outcome: Progressing   Problem: Safety: Goal: Ability to remain free from injury will improve Outcome: Progressing   Problem: Skin Integrity: Goal: Risk for impaired skin integrity will decrease Outcome: Progressing   

## 2023-04-23 NOTE — Consult Note (Signed)
WOC Nurse Consult Note: Reason for Consult: Requested to assess a wound on the right leg. Pt was using Unna boot last visit. Wound type: Wound on the right malleolar area. Pressure Injury POA: NA Measurement: 1cmx1cmx0.1cm Wound bed: cover by dark scab, white tissue below the scab, can be a tendon exposition. Draining a yellow minimum amount purulent exudate, no odor. Painful when manipulated. Periwound: intact.  Dressing procedure/placement/frequency: Apply Xeroform (change daily), cover with foam dressing, change every 3 days or PRN. Pt does not want unna boot anymore. As an alternative: Wrap with Kerlix and ACE wrap. Change daily.  WOC team will not plan to follow further.  Please reconsult if further assistance is needed. Thank-you,  Denyse Amass BSN, RN, [employer], WOC  (Pager: [phone])

## 2023-04-23 NOTE — TOC Progression Note (Addendum)
Transition of Care [facility]) - Progression Note    Patient Details  Name: Bruce [name] MRN: [id number] Date of Birth: 08/05/1945  [facility] CM/SW Contact  Michaela Corner, [place] Phone Number: 04/23/2023, 11:06 AM  Clinical Narrative:   CSW went to pts room to discuss PT recs for SNF. Pt asleep upon entry and did not wake up to verbal command. CSW will follow up at later time.   2:49 PM CSW met pt at bedside to ask about PT recs for SNF. Pt denied at this time. CSW asked pt if he has support at home. Pt states he has a roommate but that his neighbor looks out for him more than anyone else.   TOC will continue to follow.   Expected Discharge Plan: Skilled Nursing Facility Barriers to Discharge: Continued Medical Work up  Expected Discharge Plan and Services In-house Referral: Clinical Social Work     Living arrangements for the past 2 months: Apartment                                       Social Determinants of Health (SDOH) Interventions SDOH Screenings   Food Insecurity: Food Insecurity Present (04/19/2023)  Housing: High Risk (04/19/2023)  Transportation Needs: No Transportation Needs (04/19/2023)  Utilities: Not At Risk (04/19/2023)  Financial Resource Strain: High Risk (04/12/2022)  Physical Activity: Inactive (02/05/2017)  Social Connections: Unknown (04/19/2023)  Stress: Stress Concern Present (02/05/2017)  Tobacco Use: Medium Risk (04/19/2023)    Readmission Risk Interventions     No data to display

## 2023-04-23 NOTE — Plan of Care (Signed)
  Problem: Education: Goal: Knowledge of General Education information will improve Description: Including pain rating scale, medication(s)/side effects and non-pharmacologic comfort measures Outcome: Progressing   Problem: Clinical Measurements: Goal: Respiratory complications will improve Outcome: Progressing Requires O2   Problem: Activity: Goal: Risk for activity intolerance will decrease Outcome: Progressing   Problem: Coping: Goal: Level of anxiety will decrease Outcome: Progressing   Problem: Safety: Goal: Ability to remain free from injury will improve Outcome: Progressing

## 2023-04-23 NOTE — Consult Note (Signed)
[facility] Liaison Consult Note    04/23/2023  Bruce [name] 1945/09/07 [id number]  Insurance: [employer] Dual Complete   Primary Care Provider: Miki Kins, FNP with [facility], this provider is listed for the transition of care follow up appointments  and [facility] calls   [facility] Liaison rounds at [facility] however, patient is on Droplet Precaution and PPE reserved for hospital staff.  Telephone attempted to reach out. Addendum: 04/24/23 11:45 rounds No new assessment for needs   The patient was screened for noted SDOH review  with noted medium risk score for readmission prev The patient was assessed for potential Community Care Coordination service needs for post hospital transition for care coordination. Review of patient's electronic medical record reveals patient is active with [facility] and has been outreached for SDOH.  Currently, patient is being recommended for a SNF rehab level of care for post hospital.  Plan: [facility] Liaison will continue to follow progress and disposition to asess for post hospital community care coordination/management needs.  Referral request for community care coordination: following for transition needs   [facility] Community Care, Population Health does not replace or interfere with any arrangements made by the Inpatient Transition of Care team.   For questions contact:   Charlesetta Shanks, RN, BSN, CCM [place]  [facility], [facility] Liaison Direct Dial: [phone] or secure chat Email: [email or web address]

## 2023-04-23 NOTE — Progress Notes (Signed)
SATURATION QUALIFICATIONS: (This note is used to comply with regulatory documentation for home oxygen)  Patient Saturations on Room Air at Rest N/A  Patient Saturations on Room Air while Ambulating = NA  Patient Saturations on 4 Liters of oxygen while Ambulating = 85%  Please briefly explain why patient needs home oxygen: Patient oxygen level dropped down to 85% on 4 liters. Had to increase to 6 liters to keep him at 87 to 88%.

## 2023-04-23 NOTE — Progress Notes (Addendum)
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   Today, patient reported not feeling well.  He stated that he felt weak and that he does not feel like getting out of bed to walk.  During conversation, patient then stated that he wants to get a bed and walk but the nurses will not let him.  He denies nausea, vomiting, diarrhea, chest pain, or worsening shortness of breath.  Patient declines SNF, patient also declined any home or outpatient physical therapy.  He does express concerns of passing the flu to his roommate.  Objective:  Vital signs in last 24 hours: Vitals:   04/23/23 0449 04/23/23 0742 04/23/23 0754 04/23/23 1120  BP: 105/61 109/65  115/66  Pulse:  61  60  Resp: (!) 28 20  (!) 24  Temp: 98.4 F (36.9 C) 97.6 F (36.4 C)  (!) 97.5 F (36.4 C)  TempSrc: Oral Oral  Oral  SpO2: 93% 97% 94% 95%  Weight: 68.9 kg     Height:       Physical Exam: General: NAD, sitting in bed  Cardiac:RRR, no murmur appreciated  Pulmonary:Normal effort on 4LNC, no rales or rhonchi appreciated, nonproductive cough present  Neuro:alert, awake, participating in exam  MSK:no edema present, R lateral malleoli wound is present (image below) Skin:warm and dry        Latest Ref Rng & Units 04/20/2023    2:53 AM 04/19/2023    5:56 AM 04/18/2023    4:24 PM  CBC  WBC 4.0 - 10.5 K/uL 5.8  5.6  4.3   Hemoglobin 13.0 - 17.0 g/dL 16.1  09.6  04.5   Hematocrit 39.0 - 52.0 % 36.0  39.5  38.1   Platelets 150 - 400 K/uL 198  208  199        Latest Ref Rng & Units 04/23/2023    2:25 AM  04/22/2023    2:37 AM 04/21/2023    2:50 AM  CMP  Glucose 70 - 99 mg/dL 409  99  88   BUN 8 - 23 mg/dL 29  25  23    Creatinine 0.61 - 1.24 mg/dL 8.11  9.14  7.82   Sodium 135 - 145 mmol/L 135  133  132   Potassium 3.5 - 5.1 mmol/L 4.1  3.6  4.1   Chloride 98 - 111 mmol/L 98  96  99   CO2 22 - 32 mmol/L 26  25  24    Calcium 8.9 - 10.3 mg/dL 9.1  8.9  8.6      Assessment/Plan:  Principal Problem:   Acute decompensated heart failure (HCC) Active Problems:   PAF (paroxysmal atrial fibrillation) (HCC)   Pulmonary HTN, group IV   Prolonged Q-T interval on ECG   Influenza A   Cor pulmonale (chronic) (HCC)   Acute on chronic respiratory failure with hypoxia (HCC)  Acute on chronic hypoxic respiratory failure 2/2 HFrEF exacerbation + influenza Biventricular HFrEF Cor-Pulmonale Patient presented with acute hypoxic respiratory failure due to a HFrEF exacerbation and influenza infection. His hospitalization has been complicated by hypotension and R heart failure resulting in discontinuing IV lasix for  diuresis. Volume exam today euvolemic while off of the torsemide.  Oxygenation is stable at [facility].  Patient is nearing discharge and patient is declining PT outpatient, SNF, HH.  Plan: -Continue farixga 10 mg, holding losartan -On home Ranexa 500mg  BID  -Strict Ins and outs -Daily standing weights -Continue Telemetry  -Obtain ambulatory saturations prior to d/c  -D/c UNNA boots    Wound present on right lateral malleoli  Wound is chronic per patient.  WOC signed off and recommended:  -Apply Xeroform (change daily), cover with foam dressing, change every 3 days or PRN.   Influenza A Will complete treatment this morning   Elevation in Serum Creatinine Stable likely due to patient's diuresis  -Avoid nephrotoxic agents  -BMP daily      PAF Remains in  NSR on exam, on a home regimen of Digoxin, Amiodarone, Eliquis.  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 Home regiment of Albuterol and Stiolto Respimat Plan: - Sildenafil 20mg  TID  - Continue home Albuterol and Pulmicort  - Continue Eliquis    Prolonged Qtc EKG reveals a Qtc of 493 -Will monitor K, Mag, Ca   MDD GAD Not on SSRI. On home regimen of Xanax 0.5mg  TID PRN - Xanax 0.5 mg BID prn to avoid withdrawal    Restless Leg Syndrome - Ropinerole 1mg  at bedtime  Resolved Problems:  -Elevated Troponin  __________________________________  Code Status: DNR/DNI VTE Prophylaxis:eliquis  Diet:HH IVF:N/A Barriers to Discharge:Medical treatment Dispo: Anticipated discharge in approximately 1 day(s).   Faith Rogue, DO 04/23/2023, 1:41 PM Pager: [phone] After 5pm on weekdays and 1pm on weekends: On Call pager [phone]

## 2023-04-23 NOTE — TOC Progression Note (Addendum)
Transition of Care [facility]) - Progression Note    Patient Details  Name: Bruce [name] MRN: [id number] Date of Birth: 06-17-1945  Transition of Care [facility]) CM/SW Contact  Leone Haven, RN Phone Number: 04/23/2023, 3:41 PM  Clinical Narrative:    NCM spoke with patient , offered choice, he has no preference, NCM made referral to [facility] with [facility] for HHPT, HHOT.  He is able to take referral.  Soc will begin 24 to 48 hrs post dc. NCM contacted Dolandra at [employer] informed her that patient needs service callon portable oxygen and his concentrator.  She states she will  have someone out there when he is discharged, they will bring two oxygen tanks to the room for him to go home with.     Expected Discharge Plan: Skilled Nursing Facility Barriers to Discharge: Continued Medical Work up  Expected Discharge Plan and Services In-house Referral: Clinical Social Work     Living arrangements for the past 2 months: Apartment                                       Social Determinants of Health (SDOH) Interventions SDOH Screenings   Food Insecurity: Food Insecurity Present (04/19/2023)  Housing: High Risk (04/19/2023)  Transportation Needs: No Transportation Needs (04/19/2023)  Utilities: Not At Risk (04/19/2023)  Financial Resource Strain: High Risk (04/12/2022)  Physical Activity: Inactive (02/05/2017)  Social Connections: Unknown (04/19/2023)  Stress: Stress Concern Present (02/05/2017)  Tobacco Use: Medium Risk (04/19/2023)    Readmission Risk Interventions     No data to display

## 2023-04-23 NOTE — Progress Notes (Signed)
Orthopedic Tech Progress Note Patient Details:  Bruce [name] [birth year] [id number]  Notified secretary to let RN know ill be around 1500 to apply fresh UNNA BOOTS  Patient ID: Bruce [name], male   DOB: [birth year], 78 y.o.   MRN: [id number]  Donald Pore 04/23/2023, 8:13 AM

## 2023-04-24 ENCOUNTER — Other Ambulatory Visit (HOSPITAL_COMMUNITY): Payer: Self-pay

## 2023-04-24 ENCOUNTER — Inpatient Hospital Stay (HOSPITAL_COMMUNITY): Payer: 59

## 2023-04-24 DIAGNOSIS — J439 Emphysema, unspecified: Secondary | ICD-10-CM

## 2023-04-24 DIAGNOSIS — J101 Influenza due to other identified influenza virus with other respiratory manifestations: Secondary | ICD-10-CM | POA: Diagnosis not present

## 2023-04-24 DIAGNOSIS — J9621 Acute and chronic respiratory failure with hypoxia: Secondary | ICD-10-CM | POA: Diagnosis not present

## 2023-04-24 DIAGNOSIS — I509 Heart failure, unspecified: Secondary | ICD-10-CM | POA: Diagnosis not present

## 2023-04-24 LAB — BASIC METABOLIC PANEL
Anion gap: 7 (ref 5–15)
BUN: 24 mg/dL — ABNORMAL HIGH (ref 8–23)
CO2: 28 mmol/L (ref 22–32)
Calcium: 8.9 mg/dL (ref 8.9–10.3)
Chloride: 98 mmol/L (ref 98–111)
Creatinine, Ser: 1.26 mg/dL — ABNORMAL HIGH (ref 0.61–1.24)
GFR, Estimated: 59 mL/min — ABNORMAL LOW (ref >60)
Glucose, Bld: 89 mg/dL (ref 70–99)
Potassium: 4.6 mmol/L (ref 3.5–5.1)
Sodium: 133 mmol/L — ABNORMAL LOW (ref 135–145)

## 2023-04-24 LAB — MAGNESIUM: Magnesium: 2.1 mg/dL (ref 1.7–2.4)

## 2023-04-24 MED ORDER — STIOLTO RESPIMAT 2.5-2.5 MCG/ACT IN AERS
2.0000 | INHALATION_SPRAY | Freq: Every day | RESPIRATORY_TRACT | 12 refills | Status: DC
  Filled 2023-04-24: qty 4, 30d supply, fill #0

## 2023-04-24 MED ORDER — TORSEMIDE 20 MG PO TABS
20.0000 mg | ORAL_TABLET | Freq: Once | ORAL | Status: AC
  Administered 2023-04-24: 20 mg via ORAL
  Filled 2023-04-24: qty 1

## 2023-04-24 MED ORDER — ACETAMINOPHEN 500 MG PO TABS
1000.0000 mg | ORAL_TABLET | Freq: Four times a day (QID) | ORAL | 0 refills | Status: DC | PRN
  Filled 2023-04-24: qty 30, 4d supply, fill #0

## 2023-04-24 MED ORDER — ROSUVASTATIN CALCIUM 20 MG PO TABS
20.0000 mg | ORAL_TABLET | Freq: Every day | ORAL | 2 refills | Status: DC
  Filled 2023-04-24: qty 30, 30d supply, fill #0

## 2023-04-24 MED ORDER — BUDESONIDE 0.25 MG/2ML IN SUSP
0.2500 mg | Freq: Two times a day (BID) | RESPIRATORY_TRACT | 12 refills | Status: DC
  Filled 2023-04-24: qty 60, 15d supply, fill #0

## 2023-04-24 NOTE — Care Management Important Message (Signed)
Important Message  Patient Details  Name: Bruce [name] MRN: [id number] Date of Birth: [birth year]   Important Message Given:  Yes - Medicare IM     Renie Ora 04/24/2023, 11:48 AM

## 2023-04-24 NOTE — Progress Notes (Signed)
Physical Therapy Treatment Patient Details Name: Bruce [name] MRN: [id number] DOB: 23-May-1945 Today's Date: 04/24/2023   History of Present Illness Patient is 78 y.o. male presenting 04/18/23 with progressive dyspnea and reported fall in his bathroom. Admitted with acute decompensated heart failure. Pt +influenza. PMH: prostate cancer, CHF, COPD, coronary artery disease, GERD, HTN, MI, PVD, HLD, PAD, Hx of DVT & PE, NSTEMI, Afib, pulmonary hypertension, medical noncompliance    PT Comments  Pt greeted supine in bed, pleasant and agreeable to PT session. Pt performed bed mobility, transfers, and gait with modI using RW and PT controlling O2 tank. He was able to ambulate a total of ~236ft with intermittent standing recovery periods on 4L O2 via [place] and SpO2 maintained at 87%. Cued pt on PLB to aid in his recovery. Attempted to perform stair training, but pt declined going into the stairwell. Simulated stair climbing by pt performing standing marches inside RW with BUE support on RW and CGA. He displayed decreased hip flex L>R. Pt will continue to benefit from acute skilled PT to increase his independence and safety with mobility to allow d/c Home with HHPT.      If plan is discharge home, recommend the following: A little help with walking and/or transfers;A little help with bathing/dressing/bathroom;Assistance with cooking/housework;Assist for transportation;Help with stairs or ramp for entrance   Can travel by private vehicle     Yes  Equipment Recommendations  BSC/3in1    Recommendations for Other Services       Precautions / Restrictions Precautions Precautions: Fall Recall of Precautions/Restrictions: Intact Precaution/Restrictions Comments: watch SpO2 Restrictions Weight Bearing Restrictions Per Provider Order: No     Mobility  Bed Mobility Overal bed mobility: Modified Independent Bed Mobility: Supine to Sit     Supine to sit: HOB elevated, Modified independent  (Device/Increase time)     General bed mobility comments: Pt sat up on the L side of bed, bringing BLE off EOB, obtaining upright from [facility] elevated ~20deg and no use of bedrails.    Transfers Overall transfer level: Modified independent Equipment used: Rolling walker (2 wheels) Transfers: Sit to/from Stand Sit to Stand: Modified independent (Device/Increase time)           General transfer comment: STS from lowest bed height. Pt demonstrated proper hand placement and sequencing. Good eccentric control with sitting.    Ambulation/Gait Ambulation/Gait assistance: Modified independent (Device/Increase time) Gait Distance (Feet): 80 Feet (1x36ft, standing rest, 1x35ft, standing rest, 1x6ft, standing rest, 1x20ft, standing rest) Assistive device: Rolling walker (2 wheels) Gait Pattern/deviations: Step-through pattern, Decreased stride length, Trunk flexed Gait velocity: Decreased Gait velocity interpretation: <1.8 ft/sec, indicate of risk for recurrent falls   General Gait Details: Pt ambualted with good safety awareness, navigating around obstacles in hallway, and proper sequencing of RW. He took slow, symmetrical steps with good floor clearence and even weight shift. He maintained a straight bathroom with intermittent standing recovery periods. Pt maintained slight fwd flex posture, VC to correct without adjustment observed. He requires PT to manage O2 tank during gait.   Stairs Stairs: Yes Stairs assistance: Contact guard assist Stair Management: Two rails Number of Stairs: 5 General stair comments: Pt refused to practice actual stairs in the stairwell. Simulated stair climbing by having pt perform standing marches inside RW with BUE support. He demonstrated limited L hip flex and reported pain and tightness. Pt's R hip flex was [facility]. He completed 5 reps on ea leg without LOB.   Wheelchair Mobility  Tilt Bed    Modified Rankin (Stroke Patients Only)       Balance  Overall balance assessment: Mild deficits observed, not formally tested                                          Communication Communication Communication: No apparent difficulties  Cognition Arousal: Alert Behavior During Therapy: WFL for tasks assessed/performed   PT - Cognitive impairments: No apparent impairments                         Following commands: Intact      Cueing Cueing Techniques: Verbal cues  Exercises      General Comments General comments (skin integrity, edema, etc.): Start of Session: VSS on 4L [place]. During activity pt's SpO2 was maintained at 87% on 4L. Increased to 6L and cued PLB with no change observed, stayed at 87%. End of Session: VSS on 4L .      Pertinent Vitals/Pain Pain Assessment Pain Assessment: 0-10 Pain Score: 4  Pain Location: R ankle and L hip Pain Descriptors / Indicators: Aching, Discomfort, Dull Pain Intervention(s): Monitored during session    Home Living                          Prior Function            PT Goals (current goals can now be found in the care plan section) Acute Rehab PT Goals Patient Stated Goal: Go Home Progress towards PT goals: Progressing toward goals    Frequency    Min 1X/week      PT Plan      Co-evaluation              AM-PAC PT "6 Clicks" Mobility   Outcome Measure  Help needed turning from your back to your side while in a flat bed without using bedrails?: A Little Help needed moving from lying on your back to sitting on the side of a flat bed without using bedrails?: A Little Help needed moving to and from a bed to a chair (including a wheelchair)?: A Little Help needed standing up from a chair using your arms (e.g., wheelchair or bedside chair)?: A Little Help needed to walk in hospital room?: A Little Help needed climbing 3-5 steps with a railing? : A Lot 6 Click Score: 17    End of Session Equipment Utilized During Treatment: Gait  belt;Oxygen Activity Tolerance: Patient tolerated treatment well Patient left: Other (comment);with call bell/phone within reach (Pt seated on EOB with tray table positioned in front of him.) Nurse Communication: Mobility status;Other (comment) (Pt positioning and SpO2 during ambulation) PT Visit Diagnosis: Unsteadiness on feet (R26.81);Other abnormalities of gait and mobility (R26.89);Muscle weakness (generalized) (M62.81);History of falling (Z91.81);Difficulty in walking, not elsewhere classified (R26.2);Pain Pain - Right/Left: Right Pain - part of body: Leg     Time: 1610-9604 PT Time Calculation (min) (ACUTE ONLY): 26 min  Charges:    $Gait Training: 23-37 mins PT General Charges $$ ACUTE PT VISIT: 1 Visit                     Cheri Guppy, PT, DPT Acute Rehabilitation Services Office: [phone] Secure Chat Preferred  Richardson Chiquito 04/24/2023, 1:18 PM

## 2023-04-24 NOTE — Progress Notes (Signed)
Mobility Specialist Progress Note:   04/24/23 1005  Mobility  Activity Ambulated with assistance in hallway  Level of Assistance Standby assist, set-up cues, supervision of patient - no hands on  Assistive Device Front wheel walker  Distance Ambulated (ft) 60 ft  Activity Response Tolerated well  Mobility Referral Yes  Mobility visit 1 Mobility  Mobility Specialist Start Time (ACUTE ONLY) 1005  Mobility Specialist Stop Time (ACUTE ONLY) 1020  Mobility Specialist Time Calculation (min) (ACUTE ONLY) 15 min   Pt agreeable to mobility session. Required no physical assistance throughout, only supervision for safety. Pt required up to 6LO2 to maintain SpO2 86-87%, recovered to 91% with standing rest. Pt back in bed with all needs met, back on 4L.   Addison Lank Mobility Specialist Please contact via SecureChat or  Rehab office at [phone]

## 2023-04-24 NOTE — Progress Notes (Addendum)
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   Today, patient reported feeling well. He denied chest pain and stated that his shortness of breath is stable. He would like to go home. He feels much better than yesterday.   Objective:  Vital signs in last 24 hours: Vitals:   04/24/23 0100 04/24/23 0430 04/24/23 0824 04/24/23 0953  BP: 107/68 105/66 130/77   Pulse: (!) 52 (!) 49  (!) 55  Resp: 20 17 18 18   Temp: (!) 97.2 F (36.2 C) (!) 97 F (36.1 C) 98 F (36.7 C)   TempSrc: Oral Axillary Oral   SpO2: 93% 96% 94%   Weight:  69.3 kg    Height:       Physical Exam: General:NAD, sitting in bed  Cardiac:Bradycardia, regular rhythm, no murmur auscultated   Pulmonary:clear to auscultate bilaterally, normal effort on 3L [place] Neuro: alert and awake, participating in conversation  ZOX:WRUEA edema bilaterally, UNNA boots have been removed  Skin:warm and dry  Psych:  normal mood and affect       Latest Ref Rng & Units 04/20/2023    2:53 AM 04/19/2023    5:56 AM 04/18/2023    4:24 PM  CBC  WBC 4.0 - 10.5 K/uL 5.8  5.6  4.3   Hemoglobin 13.0 - 17.0 g/dL 54.0  98.1  19.1   Hematocrit 39.0 - 52.0 % 36.0  39.5  38.1   Platelets 150 - 400 K/uL 198  208  199        Latest Ref Rng & Units 04/24/2023    3:08 AM 04/23/2023    2:25 AM 04/22/2023    2:37 AM  CMP  Glucose 70 - 99 mg/dL 89  478  99   BUN 8 - 23 mg/dL 24  29  25    Creatinine 0.61 - 1.24 mg/dL 2.95  6.21  3.08   Sodium 135 - 145 mmol/L 133  135  133   Potassium 3.5 - 5.1 mmol/L 4.6  4.1  3.6   Chloride  98 - 111 mmol/L 98  98  96   CO2 22 - 32 mmol/L 28  26  25    Calcium 8.9 - 10.3 mg/dL 8.9  9.1  8.9      Assessment/Plan:  Principal Problem:   Acute decompensated heart failure (HCC) Active Problems:   PAF (paroxysmal atrial fibrillation) (HCC)   Pulmonary HTN, group IV   Prolonged Q-T interval on ECG   Influenza A   Cor pulmonale (chronic) (HCC)   Acute on chronic respiratory failure with hypoxia (HCC)  Acute on chronic hypoxic respiratory failure 2/2 HFrEF exacerbation + influenza Biventricular HFrEF Cor-Pulmonale Patient presented with acute hypoxic respiratory failure due to a HFrEF exacerbation and influenza infection. His hospitalization has been complicated by hypotension and R heart failure resulting in discontinuing IV lasix for diuresis. Volume exam today euvolemic while off of the torsemide, he is still hypoxic with ambulation; requiring 6L to achieve a SpO2 of 86-87%. His at rest hypoxia has resolved, but his ambulation hypoxia remains. Consulted pulmonology for additional recommendations.   Plan: -Continue farixga 10 mg, holding losartan -  On home Ranexa 500mg  BID  -Strict Ins and outs -Daily standing weights -Continue Telemetry  -Continue Digoxin -Will try a dose of 20mg  torsemide today, spoke with Dr. Shirlee Latch abut the utility of diuresing gently    Wound present on right lateral malleoli  Wound is chronic per patient.  WOC signed off and recommended:  -Apply Xeroform (change daily), cover with foam dressing, change every 3 days or PRN.   Elevation in Serum Creatinine Improving. -Avoid nephrotoxic agents  -BMP daily      PAF Remains in  NSR on exam, on a home regimen of Amiodarone, Eliquis.  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 Home regimen of Albuterol and Stiolto Respimat Plan: - Sildenafil 20mg  TID   - Continue home Albuterol and Pulmicort  - Continue Eliquis    Prolonged Qtc EKG reveals a Qtc of 493 -Will monitor K, Mag, Ca   MDD GAD Not on SSRI. On home regimen of Xanax 0.5mg  TID PRN - d/c xanax due to prolonged Qtc    Restless Leg Syndrome - Ropinerole 1mg  at bedtime  Resolved Problems:  -Elevated Troponin  -Influenza A __________________________________  Code Status: DNR/DNI VTE Prophylaxis:eliquis  Diet:HH IVF:N/A Barriers to Discharge:Medical treatment Dispo: Anticipated discharge in approximately 1 day(s).   Faith Rogue, DO 04/24/2023, 1:51 PM Pager: [phone] After 5pm on weekdays and 1pm on weekends: On Call pager [phone]

## 2023-04-24 NOTE — Progress Notes (Signed)
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  Hospitalization complicated by hypotension in which PCCM was initially consulted, diuretics and sildenafil were held, midodrine required only on 2/14 but did not require vasopressor support.  AHF team also consulted and were following.  Sildenafil since restarted with daily assessment for diuretics.    Pertinent  Medical History  hypertension, COPD on chronic 3LNC, tobacco use, MIx2, CAD, HFrEF 40-45%, anxiety, GERD, submassive PE 04/2022 requiring thrombectomy (?CTEPH)/DVT on Xarelto  Significant Hospital Events: Including procedures, antibiotic start and stop dates in addition to other pertinent events   2/12: admit for resp/cardiac failure, flu +. Eval by cardiology - started on diuresis, rec AHF team to see.  2/13: AHF eval - increasing lasix  2/14: hypotension. Was given lorsartan night prior. ~noon AHF called to bedside with SBP 70s, dizziness. Recommended holding pm lasix and sildenafil, started midodrine  Interim History / Subjective:  Pt requiring 6L during exertion, back to baseline 4L at rest.  Has declined SNF/ rehab placement or home PT.  Pt states he feels better than he has in a while.  Denies chills, fever, cough.  States ready to go home.   Objective   Blood pressure 130/77, pulse (!) 55, temperature 98 F (36.7 C), temperature source Oral, resp. rate 18,  height 6\' 1"  (1.854 m), weight 69.3 kg, SpO2 94%.    FiO2 (%):  [36 %] 36 %   Intake/Output Summary (Last 24 hours) at 04/24/2023 1305 Last data filed at 04/24/2023 1005 Gross per 24 hour  Intake 1017 ml  Output 600 ml  Net 417 ml   Filed Weights   04/22/23 0515 04/23/23 0449 04/24/23 0430  Weight: 69.8 kg 68.9 kg 69.3 kg    Examination: General:  chronically ill appearing older male sitting in bed in NAD HEENT: MM pink/dry, no JVD Neuro: Aox4, non focal CV: rr, SR/ SB, no appreciable murmur PULM:  non labored, speaking full sentences, clear, diminished in bases, no wheezes GI: soft, bs+, NT Extremities: warm/dry, +1 pitting LE edema    Resolved Hospital Problem list    Assessment & Plan:   Acute on chronic hypoxic respiratory failure  COPD  Influenza A+  PTA uses 3L [place] at rest, 4L w/ exertion - completed tamiflu 2/17  - continue triple therapy with brovana, pulmicort, yupelri while inpt, prn duonebs, back to home stiolto/ prn albuterol nebs. - concern from primary team that his exertional requirements are higher than baseline.  Required 6L to keep 86-87% with exertion, then at rest requiring 4L at 91%.  Given his severe underlying lung disease and pHTN, and suspect some deconditioning and recovering with flu, this is not unexpected.  No obvious other concerns> remains afebrile, no new/ change in cough.   - cont PT/ pulmonary hygiene efforts - ok from pulmonary aspect to be discharged home but need to ensure he has his home inhalers and O2 requirements prior to discharge.   - Schedule  followup outpt w/ Dr. Aundria Rud for pulmonary f/u on Rhunette Croft, NP on 05/15/23 at 11am in our [place] office.  Pt will need to reschedule if unable to make or need to reschedule.    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  Wts 69.3, admit wt 70.2.  reports LE edema is much improved.   - will need close f/u outpt with AHF/ Dr. Gala Romney.  RHC deferred this admit given current flu infection   Remainder per primary/ IMTS.  Nothing further to add.  PCCM will sign off.  Please call us back if we can be of any further assistance.  Best Practice (right click and "Reselect all SmartList Selections" daily)  Per primary team.    Labs   CBC: Recent Labs  Lab 04/18/23 1139 04/18/23 1624 04/19/23 0556 04/20/23 0253  WBC 4.3 4.3 5.6 5.8  NEUTROABS 3.3  --   --   --   HGB 12.0* 12.5* 13.0 12.0*  HCT 37.1* 38.1* 39.5 36.0*  MCV 99.5 99.0 98.8 96.5  PLT 206 199 208 198    Basic Metabolic Panel: Recent Labs  Lab 04/20/23 0253 04/21/23 0250 04/22/23 0237 04/23/23 0225 04/24/23 0308  NA 133* 132* 133* 135 133*  K 3.8 4.1 3.6 4.1 4.6  CL 98 99 96* 98 98  CO2 26 24 25 26 28   GLUCOSE 103* 88 99 121* 89  BUN 20 23 25* 29* 24*  CREATININE 1.29* 1.19 1.37* 1.35* 1.26*  CALCIUM 8.5* 8.6* 8.9 9.1 8.9  MG 1.9 2.2 1.9 2.3 2.1   GFR: Estimated Creatinine Clearance: 49.7 mL/min (A) (by C-G formula based on SCr of 1.26 mg/dL (H)). Recent Labs  Lab 04/18/23 1139 04/18/23 1624 04/19/23 0556 04/20/23 0253 04/20/23 1215  WBC 4.3 4.3 5.6 5.8  --   LATICACIDVEN  --   --   --   --  1.7    Liver Function Tests: Recent Labs  Lab 04/18/23 1139 04/19/23 0556  AST 31 36  ALT 20 25  ALKPHOS 107 107  BILITOT 1.1 1.2  PROT 6.2* 6.8  ALBUMIN 2.9* 3.1*   No results for input(s): "LIPASE", "AMYLASE" in the last 168 hours. No results for input(s): "AMMONIA" in the last 168 hours.  ABG    Component Value Date/Time   PHART 7.434 04/13/2022 1118   PCO2ART 26.5 (L) 04/13/2022 1118   PO2ART 56 (L) 04/13/2022 1118   HCO3 24.6 04/18/2023 1826   TCO2 24 04/13/2022 1125   TCO2 25  04/13/2022 1125   ACIDBASEDEF 1.0 04/13/2022 1125   O2SAT 59.6 04/18/2023 1826     Coagulation Profile: No results for input(s): "INR", "PROTIME" in the last 168 hours.  Cardiac Enzymes: No results for input(s): "CKTOTAL", "CKMB", "CKMBINDEX", "TROPONINI" in the last 168 hours.  HbA1C: Hgb A1c MFr Bld  Date/Time Value Ref Range Status  02/05/2023 09:51 AM 5.6 4.8 - 5.6 % Final    Comment:             Prediabetes: 5.7 - 6.4          Diabetes: >6.4          Glycemic control for adults with diabetes: <  7.0   11/02/2022 02:06 PM 5.7 (H) 4.8 - 5.6 % Final    Comment:             Prediabetes: 5.7 - 6.4          Diabetes: >6.4          Glycemic control for adults with diabetes: <7.0     CBG: No results for input(s): "GLUCAP" in the last 168 hours.  Allergies Allergies  Allergen Reactions   Adhesive [Tape] Other (See Comments)    After right leg fracture surgery, pt developed a large blister where tape was applied to his right leg. OK to use paper tape.   Eggs-Apples-Oats [Alitraq] Other (See Comments)    Caused chest pains   Imdur [Isosorbide Dinitrate] Other (See Comments)    hallucinations   Singulair [Montelukast Sodium] Other (See Comments)    Hallucinations      Home Medications  Prior to Admission medications   Medication Sig Start Date End Date Taking? Authorizing Provider  amiodarone (PACERONE) 200 MG tablet Take 0.5 tablets (100 mg total) by mouth daily. Patient taking differently: Take 100 mg by mouth in the morning. 08/25/22  Yes Bensimhon, Bevelyn Buckles, MD  apixaban (ELIQUIS) 5 MG TABS tablet TAKE ONE TABLET (5 MG TOTAL) BY MOUTH TWO (TWO) TIMES DAILY. Patient taking differently: Take 5 mg by mouth 2 (two) times daily. 09/26/22  Yes Bensimhon, Bevelyn Buckles, MD  cetirizine (ZYRTEC) 10 MG tablet Take 1 tablet (10 mg total) by mouth daily. Patient taking differently: Take 10 mg by mouth in the morning. 05/12/22  Yes [name], NP  cholecalciferol (VITAMIN D3) 25 MCG  (1000 UNIT) tablet Take 1,000 Units by mouth in the morning.   Yes [provider]  digoxin (LANOXIN) 0.125 MG tablet TAKE ONE TABLET (0.125 MG TOTAL) BY MOUTH DAILY. Patient taking differently: Take 0.125 mg by mouth in the morning. 01/11/23  Yes Bensimhon, Bevelyn Buckles, MD  FARXIGA 10 MG TABS tablet TAKE ONE TABLET (10 MG TOTAL) BY MOUTH DAILY. Patient taking differently: Take 10 mg by mouth in the morning. 12/15/22  Yes Milford, Anderson Malta, FNP  fluticasone (FLONASE) 50 MCG/ACT nasal spray Place 1 spray into both nostrils daily as needed for allergies. 03/26/23  Yes [provider]  gabapentin (NEURONTIN) 100 MG capsule Take 1 capsule (100 mg total) by mouth 3 (three) times daily. 02/19/23  Yes Miki Kins, FNP  losartan (COZAAR) 25 MG tablet TAKE 0.5 TABLETS (12.5 MG TOTAL) BY MOUTH AT BEDTIME. Patient taking differently: Take 12.5 mg by mouth at bedtime. 11/21/22  Yes Bensimhon, Bevelyn Buckles, MD  Multiple Vitamin (MULTIVITAMIN) tablet Take 1 tablet by mouth in the morning. For Men   Yes [provider]  Omega-3 Fatty Acids (FISH OIL PO) Take 1,000 mg by mouth every evening.   Yes [provider]  omeprazole (PRILOSEC) 40 MG capsule Take 40 mg by mouth 2 (two) times daily. 01/10/23  Yes [provider]  pantoprazole (PROTONIX) 40 MG tablet Take 1 tablet (40 mg total) by mouth daily. Patient taking differently: Take 40 mg by mouth in the morning. 01/10/23  Yes Miki Kins, FNP  potassium chloride 20 MEQ TBCR Take 2 tablets (40 mEq total) by mouth daily. 04/05/23  Yes Bensimhon, Bevelyn Buckles, MD  ranolazine (RANEXA) 500 MG 12 hr tablet TAKE ONE TABLET (500 MG TOTAL) BY MOUTH TWO (TWO) TIMES DAILY. Patient taking differently: Take 500 mg by mouth 2 (two) times daily. 01/11/23  Yes Bensimhon, Bevelyn Buckles, MD  rOPINIRole (REQUIP) 1 MG tablet TAKE ONE TABLET (1 MG TOTAL) BY MOUTH IN THE MORNING AND AT BEDTIME. Patient taking differently: Take 1 mg by mouth 2 (two)  times daily. 01/15/23  Yes [place]ins, Hospital doctor, NP  rosuvastatin (CRESTOR) 10 MG tablet TAKE ONE TABLET (10 MG TOTAL) BY MOUTH DAILY. Patient taking differently: Take 10 mg by mouth every evening. 11/21/22  Yes Bensimhon, Bevelyn Buckles, MD  sildenafil (REVATIO) 20 MG tablet Take 1 tablet (20 mg total) by mouth 3 (three) times daily. 01/15/23  Yes Milford, Anderson Malta, FNP  Tiotropium Bromide-Olodaterol (STIOLTO RESPIMAT) 2.5-2.5 MCG/ACT AERS Inhale 2 puffs into the lungs daily. 07/20/22 07/20/23 Yes Dgayli, Lianne Bushy, MD  albuterol (VENTOLIN HFA) 108 (90 Base) MCG/ACT inhaler TAKE TWO PUFFS BY MOUTH AS NEEDED FOR WHEEZING EVERY 4-6 HOURS Patient not taking: Reported on 04/18/2023 11/10/22   Laurier Nancy, MD  ALPRAZolam Prudy Feeler) 0.5 MG tablet TAKE ONE TABLET BY MOUTH THREE TIMES A DAY AS NEEDED FOR SLEEP OR ANXIETY 04/19/23   Miki Kins, FNP  baclofen (LIORESAL) 10 MG tablet TAKE ONE TABLET (10 MG TOTAL) BY MOUTH AT BEDTIME AS NEEDED FOR MUSCLE SPASMS. 04/19/23   Miki Kins, FNP  lidocaine (LIDODERM) 5 % Place 1 patch onto the skin daily. Remove & Discard patch within 12 hours or as directed by MD Patient not taking: Reported on 04/18/2023 05/12/22   [name], NP  nitroGLYCERIN (NITROSTAT) 0.4 MG SL tablet Place 0.4 mg under the tongue every 5 (five) minutes as needed for chest pain. Patient not taking: Reported on 04/18/2023    [provider]     Critical care time: na       Posey Boyer, MSN, AG-ACNP-BC [facility] 04/24/2023, 2:36 PM  See Amion for pager If no response to pager , please call [phone] until 7pm After 7:00 pm call Elink  [phone]

## 2023-04-25 DIAGNOSIS — I5023 Acute on chronic systolic (congestive) heart failure: Secondary | ICD-10-CM | POA: Diagnosis not present

## 2023-04-25 DIAGNOSIS — J439 Emphysema, unspecified: Secondary | ICD-10-CM | POA: Diagnosis not present

## 2023-04-25 DIAGNOSIS — J9621 Acute and chronic respiratory failure with hypoxia: Secondary | ICD-10-CM | POA: Diagnosis not present

## 2023-04-25 DIAGNOSIS — J101 Influenza due to other identified influenza virus with other respiratory manifestations: Secondary | ICD-10-CM | POA: Diagnosis not present

## 2023-04-25 LAB — BASIC METABOLIC PANEL
Anion gap: 10 (ref 5–15)
BUN: 27 mg/dL — ABNORMAL HIGH (ref 8–23)
CO2: 27 mmol/L (ref 22–32)
Calcium: 9.1 mg/dL (ref 8.9–10.3)
Chloride: 96 mmol/L — ABNORMAL LOW (ref 98–111)
Creatinine, Ser: 1.25 mg/dL — ABNORMAL HIGH (ref 0.61–1.24)
GFR, Estimated: >60 mL/min (ref >60)
Glucose, Bld: 125 mg/dL — ABNORMAL HIGH (ref 70–99)
Potassium: 4.1 mmol/L (ref 3.5–5.1)
Sodium: 133 mmol/L — ABNORMAL LOW (ref 135–145)

## 2023-04-25 LAB — MAGNESIUM: Magnesium: 2.1 mg/dL (ref 1.7–2.4)

## 2023-04-25 NOTE — Progress Notes (Signed)
Occupational Therapy Treatment Patient Details Name: Bruce [name] MRN: [id number] DOB: Jun 18, 1945 Today's Date: 04/25/2023   History of present illness Patient is 78 y.o. male presenting 04/18/23 with progressive dyspnea and reported fall in his bathroom. Admitted with acute decompensated heart failure. Pt +influenza. PMH: prostate cancer, CHF, COPD, coronary artery disease, GERD, HTN, MI, PVD, HLD, PAD, Hx of DVT & PE, NSTEMI, Afib, pulmonary hypertension, medical noncompliance   OT comments  Assisted pt in getting ready for discharge. Able to dress with set up. Walked with RW to bathroom for toileting with supervision for O2 line/safety. Recommended tub bench for energy conservation as tub seat not likely to fit in his narrow tub. Reinforced energy conservation strategies and pursed lip breathing. Updated d/c rec to HHOT.      If plan is discharge home, recommend the following:  A little help with walking and/or transfers;A little help with bathing/dressing/bathroom;Assistance with cooking/housework;Assist for transportation;Help with stairs or ramp for entrance   Equipment Recommendations  Tub/shower bench    Recommendations for Other Services      Precautions / Restrictions Precautions Precautions: Fall Precaution/Restrictions Comments: watch SpO2 Restrictions Weight Bearing Restrictions Per Provider Order: No       Mobility Bed Mobility Overal bed mobility: Modified Independent                  Transfers Overall transfer level: Modified independent Equipment used: Rolling walker (2 wheels)                     Balance     Sitting balance-Leahy Scale: Good     Standing balance support: Bilateral upper extremity supported, During functional activity, Reliant on assistive device for balance Standing balance-Leahy Scale: Poor Standing balance comment: reliant on RW                           ADL either performed or assessed with clinical  judgement   ADL Overall ADL's : Needs assistance/impaired                 Upper Body Dressing : Set up;Sitting   Lower Body Dressing: Set up;Sitting/lateral leans   Toilet Transfer: Supervision/safety;Rolling walker (2 wheels);Ambulation   Toileting- Clothing Manipulation and Hygiene: Sit to/from stand;Modified independent              Extremity/Trunk Insurance account manager Communication Communication: Impaired Factors Affecting Communication: Hearing impaired   Cognition Arousal: Alert Behavior During Therapy: Flat affect Cognition: No apparent impairments                               Following commands: Intact        Cueing      Exercises      Shoulder Instructions       General Comments      Pertinent Vitals/ Pain       Pain Assessment Pain Assessment: Faces Faces Pain Scale: Hurts little more Pain Location: R ankle Pain Descriptors / Indicators: Grimacing, Guarding Pain Intervention(s): Monitored during session, Repositioned  Home Living  Prior Functioning/Environment              Frequency  Min 1X/week        Progress Toward Goals  OT Goals(current goals can now be found in the care plan section)  Progress towards OT goals: Progressing toward goals  Acute Rehab OT Goals OT Goal Formulation: With patient Time For Goal Achievement: 04/21/23 Potential to Achieve Goals: Good  Plan      Co-evaluation                 AM-PAC OT "6 Clicks" Daily Activity     Outcome Measure   Help from another person eating meals?: None Help from another person taking care of personal grooming?: A Little Help from another person toileting, which includes using toliet, bedpan, or urinal?: A Little Help from another person bathing (including washing, rinsing, drying)?: A Little Help from another  person to put on and taking off regular upper body clothing?: None Help from another person to put on and taking off regular lower body clothing?: None 6 Click Score: 21    End of Session Equipment Utilized During Treatment: Gait belt;Rolling walker (2 wheels);Oxygen  OT Visit Diagnosis: Unsteadiness on feet (R26.81);Other abnormalities of gait and mobility (R26.89);Muscle weakness (generalized) (M62.81);Pain;Other (comment) (decreased activity tolerance)   Activity Tolerance Patient tolerated treatment well   Patient Left in bed;with call bell/phone within reach;with nursing/sitter in room   Nurse Communication          Time: 3086-5784 OT Time Calculation (min): 24 min  Charges: OT General Charges $OT Visit: 1 Visit OT Treatments $Self Care/Home Management : 23-37 mins  Berna Spare, OTR/L Acute Rehabilitation Services Office: [phone]   [name] 04/25/2023, 12:53 PM

## 2023-04-25 NOTE — Progress Notes (Signed)
Mobility Specialist Progress Note:   04/25/23 1040  Mobility  Activity Ambulated with assistance in room  Level of Assistance Standby assist, set-up cues, supervision of patient - no hands on  Assistive Device Front wheel walker  Distance Ambulated (ft) 20 ft  Activity Response Tolerated well  Mobility Referral Yes  Mobility visit 1 Mobility  Mobility Specialist Start Time (ACUTE ONLY) 1040  Mobility Specialist Stop Time (ACUTE ONLY) 1052  Mobility Specialist Time Calculation (min) (ACUTE ONLY) 12 min   Pt agreeable to mobility session. No physical assistance required throughout ambulation in room. Required 4LO2 to maintain SpO2 88%, pt denies SOB. Back in bed with all needs met.   Addison Lank Mobility Specialist Please contact via SecureChat or  Rehab office at [phone]

## 2023-04-25 NOTE — Plan of Care (Signed)
   Problem: Activity: Goal: Risk for activity intolerance will decrease Outcome: Progressing   Problem: Coping: Goal: Level of anxiety will decrease Outcome: Progressing

## 2023-04-25 NOTE — Discharge Summary (Addendum)
Name: Bruce [name] MRN: [id number] DOB: 25-Oct-1945 78 y.o. PCP: Miki Kins, FNP  Date of Admission: 04/18/2023 11:25 AM Date of Discharge: 04/25/2023 11:15 AM Attending Physician: Dr. Sol Blazing  Discharge Diagnosis: Principal Problem:   Acute decompensated heart failure (HCC) Active Problems:   PAF (paroxysmal atrial fibrillation) (HCC)   Pulmonary HTN, group IV   Prolonged Q-T interval on ECG   Influenza A   Cor pulmonale (chronic) (HCC)   Acute on chronic respiratory failure with hypoxia [facility])    Discharge Medications: Allergies as of 04/25/2023       Reactions   Adhesive [tape] Other (See Comments)   After right leg fracture surgery, pt developed a large blister where tape was applied to his right leg. OK to use paper tape.   Eggs-apples-oats [alitraq] Other (See Comments)   Caused chest pains   Imdur [isosorbide Dinitrate] Other (See Comments)   hallucinations   Singulair [montelukast Sodium] Other (See Comments)   Hallucinations        Medication List     STOP taking these medications    ALPRAZolam 0.5 MG tablet Commonly known as: XANAX   baclofen 10 MG tablet Commonly known as: LIORESAL   losartan 25 MG tablet Commonly known as: COZAAR   omeprazole 40 MG capsule Commonly known as: PRILOSEC   Potassium Chloride ER 20 MEQ Tbcr       TAKE these medications    Acetaminophen Extra Strength 500 MG Tabs Take 2 tablets (1,000 mg total) by mouth every 6 (six) hours as needed for mild pain (pain score 1-3), moderate pain (pain score 4-6), fever or headache.   albuterol 108 (90 Base) MCG/ACT inhaler Commonly known as: VENTOLIN HFA TAKE TWO PUFFS BY MOUTH AS NEEDED FOR WHEEZING EVERY 4-6 HOURS   amiodarone 200 MG tablet Commonly known as: PACERONE Take 0.5 tablets (100 mg total) by mouth daily. What changed: when to take this   budesonide 0.25 MG/2ML nebulizer solution Commonly known as: PULMICORT Take 2 mLs (0.25 mg total) by nebulization 2  (two) times daily.   cetirizine 10 MG tablet Commonly known as: ZYRTEC Take 1 tablet (10 mg total) by mouth daily. What changed: when to take this   cholecalciferol 25 MCG (1000 UNIT) tablet Commonly known as: VITAMIN D3 Take 1,000 Units by mouth in the morning.   digoxin 0.125 MG tablet Commonly known as: LANOXIN TAKE ONE TABLET (0.125 MG TOTAL) BY MOUTH DAILY. What changed: See the new instructions.   Eliquis 5 MG Tabs tablet Generic drug: apixaban TAKE ONE TABLET (5 MG TOTAL) BY MOUTH TWO (TWO) TIMES DAILY. What changed: See the new instructions.   Farxiga 10 MG Tabs tablet Generic drug: dapagliflozin propanediol TAKE ONE TABLET (10 MG TOTAL) BY MOUTH DAILY. What changed: See the new instructions.   FISH OIL PO Take 1,000 mg by mouth every evening.   fluticasone 50 MCG/ACT nasal spray Commonly known as: FLONASE Place 1 spray into both nostrils daily as needed for allergies.   gabapentin 100 MG capsule Commonly known as: NEURONTIN Take 1 capsule (100 mg total) by mouth 3 (three) times daily.   lidocaine 5 % Commonly known as: LIDODERM Place 1 patch onto the skin daily. Remove & Discard patch within 12 hours or as directed by MD   multivitamin tablet Take 1 tablet by mouth in the morning. For Men   nitroGLYCERIN 0.4 MG SL tablet Commonly known as: NITROSTAT Place 0.4 mg under the tongue every 5 (five) minutes as  needed for chest pain.   pantoprazole 40 MG tablet Commonly known as: Protonix Take 1 tablet (40 mg total) by mouth daily. What changed: when to take this   ranolazine 500 MG 12 hr tablet Commonly known as: RANEXA TAKE ONE TABLET (500 MG TOTAL) BY MOUTH TWO (TWO) TIMES DAILY. What changed: See the new instructions.   rOPINIRole 1 MG tablet Commonly known as: REQUIP TAKE ONE TABLET (1 MG TOTAL) BY MOUTH IN THE MORNING AND AT BEDTIME. What changed: See the new instructions.   rosuvastatin 20 MG tablet Commonly known as: CRESTOR Take 1 tablet (20  mg total) by mouth daily. What changed:  medication strength See the new instructions.   sildenafil 20 MG tablet Commonly known as: REVATIO Take 1 tablet (20 mg total) by mouth 3 (three) times daily.   Stiolto Respimat 2.5-2.5 MCG/ACT Aers Generic drug: Tiotropium Bromide-Olodaterol Inhale 2 puffs into the lungs daily.               Durable Medical Equipment  (From admission, onward)           Start     Ordered   04/22/23 0932  For home use only DME Other see comment  Once       Comments: Oxygen concentrator  Question:  Length of Need  Answer:  12 Months   04/22/23 0931            Disposition and follow-up:   Bruce [name] was discharged from [facility] in Stable condition.  At the hospital follow up visit please address:  1.  Follow-up:  *HFrEF -Assess volume status, medication compliance  -Note that losartan was d/c  -Ensure cardiology and heart failure follow up  *Pulmonary HTN, group III and IV *COPD -Assess inhaler compliance -Consider ambulatory pulse oximetry  -Please note that we have started Pulmicort nebulizer BID -Ensure he follows up with pulmonology   -Consider pulmonary rehab, please discuss with patient   -Assess if patient received portable O2 concentrator for home   *Elevated serum creatinine   -Repeat BMP   2.  Labs / imaging needed at time of follow-up: BMP  3.  Pending labs/ test needing follow-up: N/A  4.  Medication Changes  STOPPED  -ALPRAZolam 0.5 MG tablet -baclofen 10 MG tablet -losartan 25 MG tablet -omeprazole 40 MG capsule -Potassium Chloride ER 20 MEQ Tbcr   ADDED  -budesonide 0.25 MG/2ML nebulizer solution BID  -Tylenol    MODIFIED  -Crestor dose increased to 20 mg     Follow-up Appointments:  Follow-up Information     [facility] Follow up on 05/01/2023.   Specialty: Cardiology Why: at 11am Heart and Vascular Tower  Entrance  C Contact information: [address] [place] [address] [phone]        [facility] Follow up.   Specialty: Home Health Services Why: Agency will call you to set up apt times Contact information: [address] [place] [address] [phone]         Noemi Chapel, NP. Go on 05/15/2023.   Specialty: Nurse Practitioner Why: appointment at 11am.  Please call our office if you need to reschedule. Contact information: [address] [place] [address] [phone]         Miki Kins, FNP. Call in 7 day(s).   Specialty: Family Medicine Why: Call to make an appointment within the next 7-10  days Contact information: [address] [place] [address] [phone]                 Hospital Course by problem list: -Acute on chronic hypoxic respiratory failure 2/2 HFrEF exacerbation and influenza A infection -Biventricular HFrEF -Cor-Pulmonale Patient presented with acute hypoxic respiratory failure due to a HFrEF exacerbation. Patient was grossly volume overloaded with evidence on physical exam of JVP, bilateral lower extremity pitting edema, and diffuse lung crackles auscultated during the exam. CXR showed findings suggestive of interstitial edema with a BNP of 1,456. HFrEF exacerbation was thought to be due to medication non-adherence and infection with influenza A. His hospitalization was complicated by RHF and hypotension during diuresis. His blood pressure improved off of Losartan and diuretics, he remained euvolemic. Patient did not tolerate another trial of oral diuretics, his blood pressure is very tenuous.  He was discharged home on his regimen of farxiga and digoxin for HF, losartan was discontinued.   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 Patient required supplemental oxygen of 3-6L during hospitalization. At rest, he required 3-4L.  With ambulating distances in the hallway, he required up to 6L of oxygen. Per patient, he mostly ambulates in his room and to the bathroom at home. While ambulating patient in his room, he was saturating at 88% on 4L. Patient's increased oxygen requirements while ambulating are likely due to him recovering from influenza.  An oxygen concentrator was ordered for home, social work will be in contact with oxygen concentrator and portable oxygen concentrator supplies. Discharged with all home inhalers and oxygen equipment.  PAF Remained in NSR throughout hospitalization. He was on a home regimen of amiodarone and eliquis that he continued throughout hospitalization.   Influenza A  Patient finished a course of Tamiflu.   Fall Patient reported a fall prior to admission which was most likely due to hypotension and hypoxia. Orthostatic vitals were unremarkable, PT recommended SNF but patient declined. Home PT and OT were ordered.   Elevated Troponin Patient presented with an initial troponin elevation that resolved.   Prolonged Qtc  Qtc was 497 with a repeat Qtc of 493. Cardiology recommended continuing ranolazine.   MDD GAD Not on SSRI. Xanax 0.5mg  was provided BID prn.   Restless Leg Syndrome Continued Ropinerole 1mg  at bedtime    Discharge Subjective: Patient is ready to go home today. He stated that at home he primarily walks around in his room or just to the bathroom. He stated that he does not walk elsewhere often and that he rarely goes out. His portable oxygen concentrator broke a while ago and he hasn't been able to replace it. Patient denies increased work of breathing at rest and he reports no longer having to gasp for air. He denies chest pain.   Discharge Exam:   Blood pressure 93/54, pulse 87, temperature 97.8 F (36.6 C), temperature source Oral, resp. rate 19, height 6\' 1"  (1.854 m), weight 70.6 kg, SpO2 93%.  Constitutional: sitting in chair , in no acute distress, chronically  ill appearing  Cardiovascular: regular rate and rhythm, no m/r/g Pulmonary/Chest: normal work of breathing on baseline of 3L Mulat, lungs clear to auscultation bilaterally. No crackles  Neurological: alert & awake, participating in conversation  MSK: no gross abnormalities. No pitting edema Skin: warm and dry Psych: Normal mood and affect  Pertinent Labs, Studies, and Procedures:     Latest Ref Rng & Units 04/20/2023    2:53 AM 04/19/2023  5:56 AM 04/18/2023    4:24 PM  CBC  WBC 4.0 - 10.5 K/uL 5.8  5.6  4.3   Hemoglobin 13.0 - 17.0 g/dL 16.1  09.6  04.5   Hematocrit 39.0 - 52.0 % 36.0  39.5  38.1   Platelets 150 - 400 K/uL 198  208  199        Latest Ref Rng & Units 04/25/2023    2:42 AM 04/24/2023    3:08 AM 04/23/2023    2:25 AM  CMP  Glucose 70 - 99 mg/dL 409  89  811   BUN 8 - 23 mg/dL 27  24  29    Creatinine 0.61 - 1.24 mg/dL 9.14  7.82  9.56   Sodium 135 - 145 mmol/L 133  133  135   Potassium 3.5 - 5.1 mmol/L 4.1  4.6  4.1   Chloride 98 - 111 mmol/L 96  98  98   CO2 22 - 32 mmol/L 27  28  26    Calcium 8.9 - 10.3 mg/dL 9.1  8.9  9.1     DG HIP UNILAT WITH PELVIS 2-3 VIEWS LEFT Result Date: 04/19/2023 CLINICAL DATA:  Fall, left hip pain EXAM: DG HIP (WITH OR WITHOUT PELVIS) 2-3V LEFT COMPARISON:  04/12/2022 FINDINGS: No acute bony abnormality. Specifically, no fracture, subluxation, or dislocation. Early symmetric degenerative changes in the hips bilaterally. 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 IMPRESSION: No acute bony abnormality. Electronically Signed   By: Charlett Nose M.D.   On: 04/19/2023 21:04   DG Chest Port 1 View Result Date: 04/18/2023 CLINICAL DATA:  Fall with shortness of breath. EXAM: PORTABLE CHEST 1 VIEW COMPARISON:  Chest radiograph dated Jul 24, 2022. CT chest dated 10/25/2022. FINDINGS: The heart size and mediastinal contours are within normal limits. Aortic atherosclerosis. Hyperinflation. Central pulmonary vascular congestion with bilateral  interstitial prominence. Peripheral linear opacities in the right mid and lower lung zones could reflect areas of scarring. Small right pleural effusion. No pneumothorax. No acute osseous abnormality. IMPRESSION: 1. Central pulmonary vascular congestion with findings suggestive of interstitial edema. 2. Small right pleural effusion. 3. Emphysema. Peripheral linear opacities in the right mid and lower lung zones could reflect areas of scarring. Electronically Signed   By: Hart Robinsons M.D.   On: 04/18/2023 14:20     Discharge Instructions: Discharge Instructions     Call MD for:  difficulty breathing, headache or visual disturbances   Complete by: As directed    Call MD for:  extreme fatigue   Complete by: As directed    Call MD for:  hives   Complete by: As directed    Call MD for:  persistant dizziness or light-headedness   Complete by: As directed    Call MD for:  persistant nausea and vomiting   Complete by: As directed    Call MD for:  redness, tenderness, or signs of infection (pain, swelling, redness, odor or green/yellow discharge around incision site)   Complete by: As directed    Call MD for:  severe uncontrolled pain   Complete by: As directed    Call MD for:  temperature >100.4   Complete by: As directed    Diet - low sodium heart healthy   Complete by: As directed    Discharge instructions   Complete by: As directed    You came to the hospital for shortness of breath and you were diagnosed with a heart failure exacerbation and the flu.  We  treated you with IV lasix and tamiflu.    *For your Heart Failure *Pulmonary hypertension  -We have stopped the following medications:  -Losartan, please do not take any more losartan   -Potassium Chloride   *Please note the following medication changes:        -Start Pulmicort (nebulizer), use two times a day        -Rosuvastatin dose changed to 20mg         -Tylenol, please take as need for pain        -Stop Xanax, speak to  your primary care provider about this medication change         -Stop taking baclofen        -Stop taking Prilosec    -Please see your cardiologist, heart failure doctor, and lung doctor within the next few weeks -Please see your PCP within the next 7-10 days    If you have any questions or concerns please feel free to call: Internal medicine clinic at [phone]   If you have any of these following symptoms, please call us or seek care at an emergency department: -Chest Pain -Difficulty Breathing -Syncope (passing out) -Drooping of face -Slurred speech -Sudden weakness in your leg or arm -Fever -Chills   We are glad that you are feeling better, it was a pleasure to care for you!  Faith Rogue DO   Discharge wound care:   Complete by: As directed    For the ankle wound: Apply Xeroform (change daily), cover with foam dressing, change every 3 days or PRN.   Increase activity slowly   Complete by: As directed        Signed: Faith Rogue DO [facility] - PGY1 Pager: [phone] 04/25/2023, 11:15 AM    Please contact the on call pager after 5 pm and on weekends at [phone].

## 2023-04-25 NOTE — TOC Transition Note (Addendum)
Transition of Care [facility]) - Discharge Note   Patient Details  Name: Bruce [name] MRN: [id number] Date of Birth: August 21, 1945  Transition [facility]) CM/SW Contact:  Leone Haven, RN Phone Number: 04/25/2023, 11:51 AM   Clinical Narrative:    For dc today, patient states his ride is bringing his oxygen tank to transport him home with.  States he does not need the tanks from [employer]. NCM notified Kandee Keen with [facility] of the dc today.  Patient would like a tub bench to be delivered to his home.  He does not have a preference of agency.  NCM made referral to [facility] with [employer].  Spoke with rep with [employer] , Dolanda ,and she states a rep will be going out today to make sure his oxygen Is working ok.     Barriers to Discharge: Continued Medical Work up   Patient Goals and CMS Choice Patient states their goals for this hospitalization and ongoing recovery are:: Unable to assess          Discharge Placement                       Discharge Plan and Services Additional resources added to the After Visit Summary for   In-house Referral: Clinical Social Work                                   Social Drivers of Health (SDOH) Interventions SDOH Screenings   Food Insecurity: Food Insecurity Present (04/19/2023)  Housing: High Risk (04/19/2023)  Transportation Needs: No Transportation Needs (04/19/2023)  Utilities: Not At Risk (04/19/2023)  Financial Resource Strain: High Risk (04/12/2022)  Physical Activity: Inactive (02/05/2017)  Social Connections: Unknown (04/19/2023)  Stress: Stress Concern Present (02/05/2017)  Tobacco Use: Medium Risk (04/19/2023)     Readmission Risk Interventions     No data to display

## 2023-04-26 ENCOUNTER — Telehealth: Payer: Self-pay

## 2023-04-26 NOTE — Transitions of Care (Post Inpatient/ED Visit) (Signed)
04/26/2023  Name: Bruce [name] MRN: [id number] DOB: 08/16/1945  Today's TOC FU Call Status: Today's TOC FU Call Status:: Successful TOC FU Call Completed TOC FU Call Complete Date: 04/26/23 Patient's Name and Date of Birth confirmed.  Transition Care Management Follow-up Telephone Call Date of Discharge: 04/25/23 Discharge Facility: [facility] G[facility]) Type of Discharge: Inpatient Admission Primary Inpatient Discharge Diagnosis:: Heart Failure How have you been since you were released from the hospital?: Same Any questions or concerns?: Yes Patient Questions/Concerns:: I have been prescribed new medications and I don't know what they are Patient Questions/Concerns Addressed: Other: (Contacted Paramedicine and Home Health to assist patient)  Items Reviewed: Did you receive and understand the discharge instructions provided?: No (The patient states he needs assistance with medications) Medications obtained,verified, and reconciled?: Partial Review Completed Reason for Partial Mediation Review: The patient has not picked up his medications from the pharmacy Any new allergies since your discharge?: No Dietary orders reviewed?: Yes Type of Diet Ordered:: Low sodium Heart Healthy Do you have support at home?: No  Medications Reviewed Today: Medications Reviewed Today     Reviewed by Redge Gainer, RN (Case Manager) on 04/26/23 at 1208  Med List Status: <None>   Medication Order Taking? Sig Documenting Provider Last Dose Status Informant  acetaminophen (TYLENOL) 500 MG tablet [id number]  Take 2 tablets (1,000 mg total) by mouth every 6 (six) hours as needed for mild pain (pain score 1-3), moderate pain (pain score 4-6), fever or headache. Champ Mungo, DO  Active   albuterol (VENTOLIN HFA) 108 [phone] Base) MCG/ACT inhaler [id number] No TAKE TWO PUFFS BY MOUTH AS NEEDED FOR WHEEZING EVERY 4-6 HOURS  Patient not taking: Reported on 04/18/2023   Laurier Nancy, MD Not Taking Active Pharmacy  Records, EMS/Transport Team, Self  amiodarone (PACERONE) 200 MG tablet [id number] No Take 0.5 tablets (100 mg total) by mouth daily.  Patient taking differently: Take 100 mg by mouth in the morning.   Bensimhon, Bevelyn Buckles, MD 04/16/2023 Active Pharmacy Records, EMS/Transport Team, Self  apixaban (ELIQUIS) 5 MG TABS tablet [id number] No TAKE ONE TABLET (5 MG TOTAL) BY MOUTH TWO (TWO) TIMES DAILY.  Patient taking differently: Take 5 mg by mouth 2 (two) times daily.   Bensimhon, Bevelyn Buckles, MD 04/16/2023 Active Pharmacy Records, EMS/Transport Team, Self  budesonide (PULMICORT) 0.25 MG/2ML nebulizer solution [id number]  Take 2 mLs (0.25 mg total) by nebulization 2 (two) times daily. Champ Mungo, DO  Active   cetirizine (ZYRTEC) 10 MG tablet [id number] No Take 1 tablet (10 mg total) by mouth daily.  Patient taking differently: Take 10 mg by mouth in the morning.   [name], NP 04/16/2023 Active Pharmacy Records, EMS/Transport Team, Self  cholecalciferol (VITAMIN D3) 25 MCG (1000 UNIT) tablet [id number] No Take 1,000 Units by mouth in the morning. [provider] 04/16/2023 Active Pharmacy Records, EMS/Transport Team, Self  digoxin (LANOXIN) 0.125 MG tablet [id number] No TAKE ONE TABLET (0.125 MG TOTAL) BY MOUTH DAILY.  Patient taking differently: Take 0.125 mg by mouth in the morning.   Bensimhon, Bevelyn Buckles, MD 04/16/2023 Active Pharmacy Records, EMS/Transport Team, Self  FARXIGA 10 MG TABS tablet [id number] No TAKE ONE TABLET (10 MG TOTAL) BY MOUTH DAILY.  Patient taking differently: Take 10 mg by mouth in the morning.   Prince Rome [place], [place] 04/16/2023 Active Pharmacy Records, EMS/Transport Team, Self  fluticasone [facility]) 50 MCG/ACT nasal spray [id number]  Place 1 spray into both nostrils daily as needed for allergies. [provider]  Active Pharmacy Records, EMS/Transport Team, Self  gabapentin (NEURONTIN) 100 MG capsule [id number] No Take 1 capsule (100 mg total) by mouth 3 (three) times  daily. Miki Kins, FNP 04/16/2023 Active Pharmacy Records, EMS/Transport Team, Self  lidocaine (LIDODERM) 5 % [id number] No Place 1 patch onto the skin daily. Remove & Discard patch within 12 hours or as directed by MD  Patient not taking: Reported on 04/18/2023   [name], NP Not Taking Active Pharmacy Records, EMS/Transport Team, Self  Multiple Vitamin (MULTIVITAMIN) tablet [id number] No Take 1 tablet by mouth in the morning. For Men [provider] 04/16/2023 Active Self, Pharmacy Records, EMS/Transport Team  nitroGLYCERIN (NITROSTAT) 0.4 MG SL tablet [id number] No Place 0.4 mg under the tongue every 5 (five) minutes as needed for chest pain.  Patient not taking: Reported on 04/18/2023   [provider] Not Taking Active Self, Pharmacy Records, EMS/Transport Team           Med Note Karleen Hampshire, Macarthur Critchley   Fri Jul 28, 2022  3:26 PM)    Omega-3 Fatty Acids (FISH OIL PO) [id number] No Take 1,000 mg by mouth every evening. [provider] 04/16/2023 Active Self, Pharmacy Records, EMS/Transport Team  pantoprazole (PROTONIX) 40 MG tablet [id number] No Take 1 tablet (40 mg total) by mouth daily.  Patient taking differently: Take 40 mg by mouth in the morning.   Miki Kins, FNP 04/16/2023 Active Pharmacy Records, EMS/Transport Team, Self  ranolazine (RANEXA) 500 MG 12 hr tablet [id number] No TAKE ONE TABLET (500 MG TOTAL) BY MOUTH TWO (TWO) TIMES DAILY.  Patient taking differently: Take 500 mg by mouth 2 (two) times daily.   Bensimhon, Bevelyn Buckles, MD 04/16/2023 Active Pharmacy Records, EMS/Transport Team, Self  rOPINIRole (REQUIP) 1 MG tablet [id number] No TAKE ONE TABLET (1 MG TOTAL) BY MOUTH IN THE MORNING AND AT BEDTIME.  Patient taking differently: Take 1 mg by mouth 2 (two) times daily.   Marisue Ivan, NP 04/16/2023 Active Pharmacy Records, EMS/Transport Team, Self  rosuvastatin (CRESTOR) 20 MG tablet [id number]  Take 1 tablet (20 mg total) by mouth daily. Champ Mungo, DO  Active   sildenafil (REVATIO) 20 MG tablet [id number] No Take 1 tablet (20 mg total) by mouth 3 (three) times daily. Jacklynn Ganong, [place] 04/16/2023 Active Pharmacy Records, EMS/Transport Team, Self           Med Note Azucena Fallen   Wed Apr 18, 2023  5:45 PM)    Tiotropium Bromide-Olodaterol (STIOLTO RESPIMAT) 2.5-2.5 MCG/ACT AERS [id number]  Inhale 2 puffs into the lungs daily. Champ Mungo, DO  Active             Home Care and Equipment/Supplies: Were Home Health Services Ordered?: Yes Name of Home Health Agency:: [facility] Has Agency set up a time to come to your home?: No (Inclement weather) Any new equipment or medical supplies ordered?: Yes Name of Medical supply agency?: [employer] Were you able to get the equipment/medical supplies?: Yes Do you have any questions related to the use of the equipment/supplies?: No  Functional Questionnaire: Do you need assistance with bathing/showering or dressing?: No Do you need assistance with meal preparation?: No Do you need assistance with eating?: No Do you have difficulty maintaining continence: No Do you need assistance with getting out of bed/getting out of a chair/moving?: No Do you have difficulty managing or taking your medications?: No  Follow up appointments reviewed: PCP Follow-up appointment confirmed?: Yes Date of PCP follow-up appointment?: 05/02/23 Follow-up  Provider: Grayling Congress Specialist [facility] Follow-up appointment confirmed?: Yes Date of Specialist follow-up appointment?: 05/01/23 Follow-Up Specialty Provider:: [facility] Do you need transportation to your follow-up appointment?: No Do you understand care options if your condition(s) worsen?: Yes-patient verbalized understanding  SDOH Interventions Today    Flowsheet Row Most Recent Value  SDOH Interventions   Food Insecurity Interventions AMB Referral  Housing Interventions Intervention Not Indicated  Transportation Interventions Intervention  Not Indicated  Utilities Interventions Intervention Not Indicated      Interventions Today    Flowsheet Row Most Recent Value  Chronic Disease   Chronic disease during today's visit Congestive Heart Failure (CHF)  General Interventions   General Interventions Discussed/Reviewed General Interventions Discussed, General Interventions Reviewed, Referral to Nurse  Exercise Interventions   Exercise Discussed/Reviewed Physical Activity  Physical Activity Discussed/Reviewed Physical Activity Reviewed  Education Interventions   Education Provided Provided Education  Provided Verbal Education On Medication, Insurance Plans  Pharmacy Interventions   Pharmacy Dicussed/Reviewed Medications and their functions, Medication Adherence  Medication Adherence Not taking medication  [Contacted Paramedicine, HHRN]  Safety Interventions   Safety Discussed/Reviewed Fall Risk       Goals Addressed             This Visit's Progress    TOC Care Plan       Current Barriers:  Knowledge Deficits related to plan of care for management of CHF  Care Coordination needs related to Limited social support, Medication procurement, and Limited education about Heart Failure and the need for medications Chronic Disease Management support and education needs related to CHF  Lacks caregiver support Non-adherence to prescribed medication regimen  RNCM Clinical Goal(s):  Patient will work with the Care Management team over the next 30 days to address Transition of Care Barriers: Medication access Medication Management Diet/Nutrition/Food Resources Provider appointments Home Health services Functional/Safety verbalize understanding of plan for management of CHF as evidenced by following the discharge instructions and by taking your medications demonstrate understanding of rationale for each prescribed medication as evidenced by taking the medication as directed  through collaboration with RN Care manager,  provider, and care team.   Interventions: Evaluation of current treatment plan related to  self management and patient's adherence to plan as established by provider   Heart Failure Interventions:  (Status:  New goal.) Short Term Goal Basic overview and discussion of pathophysiology of Heart Failure reviewed Provided education on low sodium diet Advised patient to weigh each morning after emptying bladder Discussed importance of daily weight and advised patient to weigh and record daily Reviewed role of diuretics in prevention of fluid overload and management of heart failure; Discussed the importance of keeping all appointments with provider Additional assistance for compliance and medication management with Paramedicine  Patient Goals/Self-Care Activities: Participate in Transition of Care Program/Attend [facility] scheduled calls Notify RN Care Manager of [facility] call rescheduling needs Take all medications as prescribed Attend all scheduled provider appointments Call pharmacy for medication refills 3-7 days in advance of running out of medications Perform all self care activities independently  Call provider office for new concerns or questions   Follow Up Plan:  Telephone follow up appointment with care management team member scheduled for:  Friday February 21st at 1:15pm         [facility] Outreach to the patient today. He was discharged from the hospital the day before. He states he is still in bed and he needs to get up and get his medication. He states he isn't sure  what he has and he isn't sure he will take it until his SN or Paramedicine person comes by to assist him. Contacted by the Home Health agency for [facility] and contacted Heather with Paramedicine for assistance. The patient states his portable tanks don't last long but he is on 3-4L/min and he will sometimes turn it up to 6l/min when walking. He states he lives with a person in a mobile home and they try to help one another as able. He  does drive and he does have a working vehicle. He states he has food and electricity. RNCM to contact the patient tomorrow and review medications with him in the hopes that he has purchased them today.  Patient is at high risk for readmission and/or has history of high utilization of ED.  Discussed [facility] TOC program and weekly calls to patient to assess condition/status, medication management and provide support/education as indicated. Patient/ Caregiver voiced understanding and is agreeable to 30 day program    Routine follow-up and on-going assessment evaluation and education of disease processes, and recommended interventions for both chronic and acute medical conditions, will occur during each weekly visit during [facility] 30-day Program Outreach calls along with ongoing review of symptoms, medication reviews and reconciliation. Any updates, inconsistencies, discrepancies or acute care concerns will be addressed on the Care Plan and routed to the correct Practitioner if indicated.    The patient has been provided with contact information for the care management team and has been advised to call with any health-related questions or concerns. The patient verbalized understanding with current POC. The patient is directed to their insurance card regarding availability of benefits coverage.   Deidre Ala, BSN, RN [place]  [facility]I - [employer] [facility] RN Care Manager [phone]

## 2023-04-27 ENCOUNTER — Other Ambulatory Visit: Payer: 59

## 2023-04-27 NOTE — Patient Outreach (Signed)
Care Management  Transitions of Care Program Transitions of Care Post-discharge Medication Follow up   04/27/2023 Name: Bruce [name] MRN: [id number] DOB: 12/20/1945  Subjective: Bruce [name] is a 78 y.o. year old male who is a primary care patient of Miki Kins, FNP. The Care Management team Engaged with patient Engaged with patient by telephone to assess and address transitions of care needs.   Consent to Services:  Patient was given information about care management services, agreed to services, and gave verbal consent to participate.   Assessment: TOC Outreach to the patient today to follow up after the conversation yesterday regarding medications. He has not taken his medication because he doesn't know what to take. I asked him to get his medications out and discharge summary and he states he can't see the bottles or the instructions. He did say that a friend who is a Patent examiner is coming today at 2pm to fix his medications. He has not heard from [facility] yet to set up SN and the are behind on their SOC's due to the inclement weather. RNCM did not hear back from Welch who is with Paramedicine. RNCM to call the Heart Failure clinic to refer. He hung up the phone because he states his phone was not working and he could not hear.     SDOH Interventions    Flowsheet Row Telephone from 04/26/2023 in [facility] ED to Hosp-Admission (Discharged) from 04/18/2023 in [facility] 3E HF PCU Care Coordination from 02/08/2023 in [facility] Coordination Telephone from 11/16/2022 in [facility]ics Established CHF 20 from 08/03/2022 in [facility] Care Coordination from 05/23/2022 in [facility] Community Care Coordination  SDOH Interventions        Food Insecurity Interventions AMB Referral Inpatient TOC, Community Resources Provided  Other (Comment)  [patient states he has a U card that he uses for food/ groceries.] -- Other (Comment)  [H&V food bag] Intervention Not Indicated  [Patient rec's assistance for food through his insurance company that he states is sufficient for monthly food]  Housing Interventions Intervention Not Indicated Inpatient TOC, [employer] Provided Other (Comment)  [patient states he is looking for housing. He reports having assistance. Declines RNCM offer to referral to social worker for housing assistance.] Other (Comment)  [sent affordable housing options] -- Other (Comment)  [Patient has is living temporarily on a friends couch and need to find housing asap]  Transportation Interventions Intervention Not Indicated -- -- -- -- Intervention Not Indicated  [patient has a vehicle and has a friend to help him drive when needed]  Utilities Interventions Intervention Not Indicated -- -- -- -- --        Goals Addressed             This Visit's Progress    TOC Care Plan       Current Barriers: (reviewed 04/27/23) Knowledge Deficits related to plan of care for management of CHF  Care Coordination needs related to Limited social support, Medication procurement, and Limited education about Heart Failure and the need for medications Chronic Disease Management support and education needs related to CHF  Lacks caregiver support Non-adherence to prescribed medication regimen  RNCM Clinical Goal(s): (reviewed 04/27/23) Patient will work with the Care Management team over the next 30 days to address Transition of Care Barriers: Medication access Medication Management Diet/Nutrition/Food Resources Provider appointments Home Health  services Functional/Safety verbalize understanding of plan for management of CHF as evidenced by following the discharge instructions and by taking your medications demonstrate understanding of rationale for each prescribed medication as evidenced by taking the medication  as directed  through collaboration with RN Care manager, provider, and care team.   Interventions: (reviewed 04/27/23) Evaluation of current treatment plan related to  self management and patient's adherence to plan as established by provider   Heart Failure Interventions:  (Status:  Goal on track:  NO.) Short Term Goal Basic overview and discussion of pathophysiology of Heart Failure reviewed Provided education on low sodium diet Advised patient to weigh each morning after emptying bladder Discussed importance of daily weight and advised patient to weigh and record daily Reviewed role of diuretics in prevention of fluid overload and management of heart failure; Discussed the importance of keeping all appointments with provider Additional assistance for compliance and medication management with Paramedicine  Patient Goals/Self-Care Activities: (reviewed 04/27/23) Participate in Transition of Care Program/Attend [facility] scheduled calls Notify RN Care Manager of [facility] call rescheduling needs Take all medications as prescribed Attend all scheduled provider appointments Call pharmacy for medication refills 3-7 days in advance of running out of medications Perform all self care activities independently  Call provider office for new concerns or questions   Follow Up Plan:  Telephone follow up appointment with care management team member scheduled for:  Thursday February 27st at 1:00pm         Plan: The patient has been provided with contact information for the care management team and has been advised to call with any health related questions or concerns.   Routine follow-up and on-going assessment evaluation and education of disease processes, and recommended interventions for both chronic and acute medical conditions, will occur during each weekly visit during [facility] 30-day Program Outreach calls along with ongoing review of symptoms, medication reviews and reconciliation. Any updates,  inconsistencies, discrepancies or acute care concerns will be addressed on the Care Plan and routed to the correct Practitioner if indicated.    The patient has been provided with contact information for the care management team and has been advised to call with any health-related questions or concerns. The patient verbalized understanding with current POC. The patient is directed to their insurance card regarding availability of benefits coverage.  Deidre Ala, BSN, RN [place]  VBCI - [employer] [facility] RN Care Manager [phone]

## 2023-04-30 ENCOUNTER — Telehealth (HOSPITAL_COMMUNITY): Payer: Self-pay

## 2023-04-30 NOTE — Telephone Encounter (Signed)
 Called to confirm/remind patient of their appointment [facility] on 05[facility]. However, patient needed to reschedule his appointment.  Patient reminded to bring all medications and/or complete list.  Confirmed patient has transportation. Gave directions, instructed to utilize valet parking.  Confirmed appointment prior to ending call.

## 2023-04-30 NOTE — Telephone Encounter (Signed)
 Spoke to [place] and confirmed home paramedicine visit for tomorrow. I was able to give his roommate a list of what medications he is to take tonight and in the morning. They both confirmed they got the list and roommate understood what to give him until I come out tomorrow. Call complete.   [name], EMT-Paramedic [phone] 04/30/2023

## 2023-05-01 ENCOUNTER — Encounter (HOSPITAL_COMMUNITY): Payer: 59

## 2023-05-02 ENCOUNTER — Encounter: Payer: Self-pay | Admitting: Family

## 2023-05-02 ENCOUNTER — Other Ambulatory Visit (HOSPITAL_COMMUNITY): Payer: Self-pay

## 2023-05-02 ENCOUNTER — Other Ambulatory Visit: Payer: Self-pay | Admitting: Family

## 2023-05-02 ENCOUNTER — Ambulatory Visit (INDEPENDENT_AMBULATORY_CARE_PROVIDER_SITE_OTHER): Payer: 59 | Admitting: Family

## 2023-05-02 VITALS — BP 148/88 | HR 91 | Ht 73.0 in | Wt 156.2 lb

## 2023-05-02 DIAGNOSIS — J441 Chronic obstructive pulmonary disease with (acute) exacerbation: Secondary | ICD-10-CM | POA: Diagnosis not present

## 2023-05-02 DIAGNOSIS — F331 Major depressive disorder, recurrent, moderate: Secondary | ICD-10-CM

## 2023-05-02 DIAGNOSIS — F411 Generalized anxiety disorder: Secondary | ICD-10-CM | POA: Diagnosis not present

## 2023-05-02 DIAGNOSIS — J9621 Acute and chronic respiratory failure with hypoxia: Secondary | ICD-10-CM

## 2023-05-02 DIAGNOSIS — I5023 Acute on chronic systolic (congestive) heart failure: Secondary | ICD-10-CM

## 2023-05-02 DIAGNOSIS — I10 Essential (primary) hypertension: Secondary | ICD-10-CM

## 2023-05-02 MED ORDER — OMEPRAZOLE 40 MG PO CPDR: 40.0000 mg | DELAYED_RELEASE_CAPSULE | Freq: Two times a day (BID) | ORAL | 1 refills | Status: DC

## 2023-05-02 MED ORDER — ALPRAZOLAM 0.5 MG PO TABS: 0.5000 mg | ORAL_TABLET | Freq: Two times a day (BID) | ORAL | 1 refills | Status: DC | PRN

## 2023-05-02 NOTE — Progress Notes (Signed)
 Paramedicine Encounter    Patient ID: Bruce [name], male    DOB: 03/11/1945, 78 y.o.   MRN: [id number]   Complaints- shortness of breath, hip and leg pain. Needing a smaller portable oxygen that isnt a tank- he struggles to pull it around due to mobility. He also is having increased anxiety since being taken off his xanax. Was prescribed budenoside neb solution but not given a nebulizer machine.   Assessment- CAOx4, warm and dry, short of breath without using his oxygen. Lungs clear, does have some congestion when coughing, vitals at baseline.   Compliance with meds- has not taken any medication since coming home from the hospital on 2/19.   Pill box filled- for one week   Refills needed- gabapentin     Social changes- none    VISIT SUMMARY- Arrived for home visit for [facility] who was ambulatory with shortness of breath not wearing his oxygen. He reports having some leg and hip pain, anxiety and shortness of breath with a cough since before he went to the hospital. I obtained vitals and assessment as noted. He has not had any meds since leaving the hospital on 2/19. I attempted several visits for Bruce [name] but was unable to reach him until today. I reviewed meds in detail and filled a pill box for him for one week. We discussed need for med compliance and for diet adherence. He verbalized understanding.    Zeyad was given pulmicort neb solution but no neb machine at discharge- he also says he damaged his portable oxygen pack and the company refuses to replace it so they provided him with tanks on a cart and he says this is difficult for him to use in public alone. He also complains about increased anxiety since being off xanax during hospital stay. I advised him to talk to his PCP about same today. I will follow up.    I reviewed upcoming appointments and confirmed same. I will see Bruce [name] in one week.   BP 122/82   Pulse 80   Resp 16   Wt 155 lb 3.2 oz (70.4 kg)   SpO2 (!) 80% Comment: on  3LPM  BMI 20.48 kg/m  Weight yesterday-- 155lbs  Last visit weight-- 166lbs     ACTION: Home visit completed     Patient Care Team: Miki Kins, FNP as PCP - General (Family Medicine) Laurier Nancy, MD as PCP - Cardiology (Cardiology) Redge Gainer, RN as [facility] Care Management  Patient Active Problem List   Diagnosis Date Noted   Acute on chronic respiratory failure with hypoxia (HCC) 04/19/2023   Acute decompensated heart failure (HCC) 04/18/2023   Prolonged Q-T interval on ECG 04/18/2023   Influenza A 04/18/2023   Cor pulmonale (chronic) (HCC) 04/18/2023   Intermittent claudication (HCC) 12/18/2022   B12 deficiency 12/18/2022   MDD (major depressive disorder), recurrent episode, moderate (HCC) 09/01/2022   Pulmonary HTN, 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   Acute on chronic systolic congestive heart failure (HCC) 04/13/2022   Pulmonary embolism (HCC) 04/09/2022   PAF (paroxysmal atrial fibrillation) (HCC)    NSTEMI (non-ST elevated myocardial infarction) (HCC) 10/10/2018   Syncope 04/28/2018   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   Limb ischemia 11/18/2013   Acute blood loss anemia 10/08/2013   Anxiety disorder 10/08/2013   Dyslipidemia 10/08/2013   GERD (gastroesophageal reflux disease) 10/08/2013   Anticoagulated 10/08/2013   Hypertension    CAD (coronary artery disease) 08/16/2010   COPD (chronic obstructive pulmonary disease) (HCC) 08/16/2010   DVT  of leg (deep venous thrombosis) (HCC) 08/16/2010    Current Outpatient Medications:    acetaminophen (TYLENOL) 500 MG tablet, Take 2 tablets (1,000 mg total) by mouth every 6 (six) hours as needed for mild pain (pain score 1-3), moderate pain (pain score 4-6), fever or headache., Disp: 30 tablet, Rfl: 0   albuterol (VENTOLIN HFA) 108 (90 Base) MCG/ACT inhaler, TAKE TWO PUFFS BY MOUTH AS NEEDED FOR WHEEZING EVERY 4-6 HOURS, Disp: 8.5 g, Rfl: 5   amiodarone (PACERONE) 200 MG tablet, Take 0.5 tablets (100 mg total) by mouth  daily. (Patient taking differently: Take 100 mg by mouth in the morning.), Disp: 15 tablet, Rfl: 11   apixaban (ELIQUIS) 5 MG TABS tablet, TAKE ONE TABLET (5 MG TOTAL) BY MOUTH TWO (TWO) TIMES DAILY. (Patient taking differently: Take 5 mg by mouth 2 (two) times daily.), Disp: 60 tablet, Rfl: 11   budesonide (PULMICORT) 0.25 MG/2ML nebulizer solution, Take 2 mLs (0.25 mg total) by nebulization 2 (two) times daily., Disp: 60 mL, Rfl: 12   cetirizine (ZYRTEC) 10 MG tablet, Take 1 tablet (10 mg total) by mouth daily. (Patient taking differently: Take 10 mg by mouth in the morning.), Disp: 30 tablet, Rfl: 11   cholecalciferol (VITAMIN D3) 25 MCG (1000 UNIT) tablet, Take 1,000 Units by mouth in the morning., Disp: , Rfl:    digoxin (LANOXIN) 0.125 MG tablet, TAKE ONE TABLET (0.125 MG TOTAL) BY MOUTH DAILY. (Patient taking differently: Take 0.125 mg by mouth in the morning.), Disp: 30 tablet, Rfl: 6   FARXIGA 10 MG TABS tablet, TAKE ONE TABLET (10 MG TOTAL) BY MOUTH DAILY. (Patient taking differently: Take 10 mg by mouth in the morning.), Disp: 90 tablet, Rfl: 1   fluticasone (FLONASE) 50 MCG/ACT nasal spray, Place 1 spray into both nostrils daily as needed for allergies., Disp: , Rfl:    gabapentin (NEURONTIN) 100 MG capsule, Take 1 capsule (100 mg total) by mouth 3 (three) times daily., Disp: 90 capsule, Rfl: 1   lidocaine (LIDODERM) 5 %, Place 1 patch onto the skin daily. Remove & Discard patch within 12 hours or as directed by MD, Disp: 30 patch, Rfl: 11   Multiple Vitamin (MULTIVITAMIN) tablet, Take 1 tablet by mouth in the morning. For Men, Disp: , Rfl:    nitroGLYCERIN (NITROSTAT) 0.4 MG SL tablet, Place 0.4 mg under the tongue every 5 (five) minutes as needed for chest pain., Disp: , Rfl:    Omega-3 Fatty Acids (FISH OIL PO), Take 1,000 mg by mouth every evening., Disp: , Rfl:    pantoprazole (PROTONIX) 40 MG tablet, Take 1 tablet (40 mg total) by mouth daily. (Patient taking differently: Take 40 mg by  mouth in the morning.), Disp: 90 tablet, Rfl: 3   ranolazine (RANEXA) 500 MG 12 hr tablet, TAKE ONE TABLET (500 MG TOTAL) BY MOUTH TWO (TWO) TIMES DAILY. (Patient taking differently: Take 500 mg by mouth 2 (two) times daily.), Disp: 60 tablet, Rfl: 6   rOPINIRole (REQUIP) 1 MG tablet, TAKE ONE TABLET (1 MG TOTAL) BY MOUTH IN THE MORNING AND AT BEDTIME. (Patient taking differently: Take 1 mg by mouth 2 (two) times daily.), Disp: 60 tablet, Rfl: 3   rosuvastatin (CRESTOR) 20 MG tablet, Take 1 tablet (20 mg total) by mouth daily., Disp: 30 tablet, Rfl: 2   sildenafil (REVATIO) 20 MG tablet, Take 1 tablet (20 mg total) by mouth 3 (three) times daily., Disp: 90 tablet, Rfl: 5   Tiotropium Bromide-Olodaterol (STIOLTO RESPIMAT) 2.5-2.5 MCG/ACT AERS,  Inhale 2 puffs into the lungs daily., Disp: 4 g, Rfl: 12 No current facility-administered medications for this visit.  Facility-Administered Medications Ordered in Other Visits:    albuterol (PROVENTIL) (2.5 MG/3ML) 0.083% nebulizer solution 2.5 mg, 2.5 mg, Nebulization, Once, Raechel Chute, MD Allergies  Allergen Reactions   Adhesive [Tape] Other (See Comments)    After right leg fracture surgery, pt developed a large blister where tape was applied to his right leg. OK to use paper tape.   Eggs-Apples-Oats [Alitraq] Other (See Comments)    Caused chest pains   Imdur [Isosorbide Dinitrate] Other (See Comments)    hallucinations   Singulair [Montelukast Sodium] Other (See Comments)    Hallucinations      Social History   Socioeconomic History   Marital status: Single    Spouse name: Not on file   Number of children: 2   Years of education: Not on file   Highest education level: 6th grade  Occupational History   Not on file  Tobacco Use   Smoking status: Former    Current packs/day: 0.00    Average packs/day: 0.5 packs/day for 52.0 years (26.0 ttl pk-yrs)    Types: Cigarettes    Start date: 07/05/1970    Quit date: 07/05/2022    Years since  quitting: 0.8   Smokeless tobacco: Never  Vaping Use   Vaping status: Never Used  Substance and Sexual Activity   Alcohol use: No    Alcohol/week: 0.0 standard drinks of alcohol    Comment: Stopped 2009   Drug use: No   Sexual activity: Not Currently  Other Topics Concern   Not on file  Social History Narrative   ** Merged History Encounter **       Social Drivers of Health   Financial Resource Strain: High Risk (04/12/2022)   Overall Financial Resource Strain (CARDIA)    Difficulty of Paying Living Expenses: Hard  Food Insecurity: Food Insecurity Present (04/26/2023)   Hunger Vital Sign    Worried About Running Out of Food in the Last Year: Sometimes true    Ran Out of Food in the Last Year: Never true  Transportation Needs: No Transportation Needs (04/26/2023)   PRAPARE - Administrator, Civil Service (Medical): No    Lack of Transportation (Non-Medical): No  Physical Activity: Inactive (02/05/2017)   Exercise Vital Sign    Days of Exercise per Week: 0 days    Minutes of Exercise per Session: 0 min  Stress: Stress Concern Present (02/05/2017)   [employer] of Occupational Health - Occupational Stress Questionnaire    Feeling of Stress : Rather much  Social Connections: Unknown (04/19/2023)   Social Connection and Isolation Panel [NHANES]    Frequency of Communication with Friends and Family: Twice a week    Frequency of Social Gatherings with Friends and Family: Once a week    Attends Religious Services: Patient declined    Database administrator or Organizations: No    Attends Banker Meetings: Never    Marital Status: Patient declined  Intimate Partner Violence: Not At Risk (04/26/2023)   Humiliation, Afraid, Rape, and Kick questionnaire    Fear of Current or Ex-Partner: No    Emotionally Abused: No    Physically Abused: No    Sexually Abused: No    Physical Exam      Future Appointments  Date Time Provider Department Center   05/02/2023 11:00 AM Miki Kins, FNP [facility] None  05/03/2023  1:00 PM Homsher, Wynona Canes, RN [facility] None  05/15/2023 11:00 AM Allison Quarry Ruby Cola, NP [facility] None  05/30/2023  9:00 AM [facility] PA/NP [facility] None

## 2023-05-03 ENCOUNTER — Other Ambulatory Visit: Payer: Self-pay

## 2023-05-03 NOTE — Patient Outreach (Signed)
 Care Management  Transitions of Care Program Transitions of Care Post-discharge week 2   05/03/2023 Name: Bruce [name] MRN: [id number] DOB: 1945-10-30  Subjective: Bruce [name] is a 78 y.o. year old male who is a primary care patient of Miki Kins, FNP. The Care Management team Engaged with patient Engaged with patient by telephone to assess and address transitions of care needs.   Consent to Services:  Patient was given information about care management services, agreed to services, and gave verbal consent to participate.   Assessment: TOC Outreach completed to the patient. Paramedicine provider visited on 05/02/23 and was able to review his medication and fill his pill box. She also called [employer] and is assisting him with portable oxygen so that he may be more independent. His oxygen is at 3l/min and he increases it up to 4 liters or 5 liters when he is ambulatory and if he needs it. He was also discharged home with nebulizer medication and not a nebulizer. She is coordinating this care for him through [employer]. The patient does have food assistance and states it is only for healthy foods and not "meat". He can buy chicken or fish but he can't buy sausage or steak. He was able to get his medication for anxiety. He states he is getting more settled in and RNCM will follow next week.    SDOH Interventions    Flowsheet Row Telephone from 04/26/2023 in [place] [facility] ED to Hosp-Admission (Discharged) from 04/18/2023 in [facility]E HF PCU Care Coordination from 02/08/2023 in [facility] Coordination Telephone from 11/16/2022 in [facility] Established CHF 20 from 08/03/2022 in [facility] Care Coordination from 05/23/2022 in [facility] Care Coordination  SDOH Interventions        Food Insecurity Interventions AMB Referral  Inpatient TOC, Community Resources Provided Other (Comment)  [patient states he has a U card that he uses for food/ groceries.] -- Other (Comment)  [H&V food bag] Intervention Not Indicated  [Patient rec's assistance for food through his insurance company that he states is sufficient for monthly food]  Housing Interventions Intervention Not Indicated Inpatient TOC, [employer] Provided Other (Comment)  [patient states he is looking for housing. He reports having assistance. Declines RNCM offer to referral to social worker for housing assistance.] Other (Comment)  [sent affordable housing options] -- Other (Comment)  [Patient has is living temporarily on a friends couch and need to find housing asap]  Transportation Interventions Intervention Not Indicated -- -- -- -- Intervention Not Indicated  [patient has a vehicle and has a friend to help him drive when needed]  Utilities Interventions Intervention Not Indicated -- -- -- -- --        Goals Addressed             This Visit's Progress    TOC Care Plan   On track    Current Barriers: (reviewed 05/03/23) Knowledge Deficits related to plan of care for management of CHF  Care Coordination needs related to Limited social support, Medication procurement, and Limited education about Heart Failure and the need for medications Chronic Disease Management support and education needs related to CHF  Lacks caregiver support Non-adherence to prescribed medication regimen  RNCM Clinical Goal(s): (reviewed 05/03/23) Patient will work with the Care Management team over the next 30 days to address Transition of Care Barriers: Medication access Medication  Management Diet/Nutrition/Food Resources Provider appointments Home Health services Functional/Safety verbalize understanding of plan for management of CHF as evidenced by following the discharge instructions and by taking your medications demonstrate understanding of rationale for each  prescribed medication as evidenced by taking the medication as directed  through collaboration with RN Care manager, provider, including the Paramedicine team that comes by weekly  Interventions: (reviewed 05/03/23) Evaluation of current treatment plan related to  self management and patient's adherence to plan as established by provider   Heart Failure Interventions:  ( Goal on track:  NO.) Short Term Goal (reviewed 05/03/23) Basic overview and discussion of pathophysiology of Heart Failure reviewed Provided education on low sodium diet Advised patient to weigh each morning after emptying bladder Discussed importance of daily weight and advised patient to weigh and record daily Reviewed role of diuretics in prevention of fluid overload and management of heart failure; Discussed the importance of keeping all appointments with provider The patient does not weigh because he cannot see the scales. His Paramedicine provider will check for CHF weekly  Additional assistance for compliance and medication management with Paramedicine  Patient Goals/Self-Care Activities: (reviewed 05/03/23) Participate in Transition of Care Program/Attend [facility] scheduled calls Notify RN Care Manager of [facility] call rescheduling needs Take all medications as prescribed Attend all scheduled provider appointments Call pharmacy for medication refills 3-7 days in advance of running out of medications  Follow Up Plan:  Telephone follow up appointment with care management team member scheduled for:  Thursday March 6 at 1:15pm        Please refer to Care Plan for goals and interventions.  Patient educated on red flags signs/symptoms to watch for and was encouraged to report any of these identified, any new symptoms, changes in baseline or medication regimen, change in health status / well-being, or safety concerns to PCP and / or the [facility] Case Management team.   The patient has been provided with contact information for the  care management team and has been advised to call with any health-related questions or concerns. The patient verbalized understanding with current POC. The patient is directed to their insurance card regarding availability of benefits coverage.  Deidre Ala, BSN, RN [place]  [facility] - [employer] [facility] RN Care Manager [phone]

## 2023-05-04 DIAGNOSIS — R0609 Other forms of dyspnea: Secondary | ICD-10-CM | POA: Diagnosis not present

## 2023-05-04 DIAGNOSIS — J449 Chronic obstructive pulmonary disease, unspecified: Secondary | ICD-10-CM | POA: Diagnosis not present

## 2023-05-04 DIAGNOSIS — J9620 Acute and chronic respiratory failure, unspecified whether with hypoxia or hypercapnia: Secondary | ICD-10-CM | POA: Diagnosis not present

## 2023-05-04 DIAGNOSIS — J479 Bronchiectasis, uncomplicated: Secondary | ICD-10-CM | POA: Diagnosis not present

## 2023-05-08 ENCOUNTER — Other Ambulatory Visit (HOSPITAL_COMMUNITY): Payer: Self-pay

## 2023-05-08 DIAGNOSIS — J9620 Acute and chronic respiratory failure, unspecified whether with hypoxia or hypercapnia: Secondary | ICD-10-CM | POA: Diagnosis not present

## 2023-05-08 DIAGNOSIS — J479 Bronchiectasis, uncomplicated: Secondary | ICD-10-CM | POA: Diagnosis not present

## 2023-05-08 DIAGNOSIS — J449 Chronic obstructive pulmonary disease, unspecified: Secondary | ICD-10-CM | POA: Diagnosis not present

## 2023-05-08 DIAGNOSIS — R0609 Other forms of dyspnea: Secondary | ICD-10-CM | POA: Diagnosis not present

## 2023-05-09 ENCOUNTER — Other Ambulatory Visit (HOSPITAL_COMMUNITY): Payer: Self-pay

## 2023-05-09 ENCOUNTER — Telehealth (HOSPITAL_COMMUNITY): Payer: Self-pay

## 2023-05-09 NOTE — Telephone Encounter (Signed)
 Bruce [name] says he used to wear compression stockings for his swelling but says the pair he had was too small and he struggles putting them on by himself. I will send to PCP to see if he can get an order for new compression sticking and a stocking application aid device.    To PCP- could we get a prescription for compression socks and a compression sock application device for Bruce [name]?   Thanks- [name], EMT-Paramedic [phone] 05/09/2023

## 2023-05-09 NOTE — Progress Notes (Signed)
 Paramedicine Encounter    Patient ID: Bruce [name], male    DOB: 10-20-1945, 78 y.o.   MRN: [id number]   Complaints- shortness of breath on exertion, back, hip and leg pain, left leg swelling.   Assessment- CAOX4, warm and dry ambulating in the living room with shortness of breath walking a short distance while wearing oxygen. Lungs clear but diminished. Vitals as noted. Weight up 3lbs from last week. He is not weighing daily as asked.   Compliance with meds- missed all doses Thursday through Saturday over the last week. [place] Spaniel he went out of town and forgot his pill box.   Pill box filled- for one week   Refills needed- none   Meds changes since last visit- none     Social changes- Pulmonary/PCP started him on Afflo Vibrating Vest to loosen mucous and he obtained his smaller portable oxygen and a nebulizer machine. I educated him on use of nebulizer today. He was able to understand and agreeable to using twice daily.    VISIT SUMMARY- Arrived for home visit for [facility] who was ambulating around his living room on his oxygen- short of breath despite wearing his oxygen. He reports feeling better this week since getting his oxygen machine and nebulizer as well as the vibrating afflo vest. He used the Afflo vest for the first time last night and says it is very comfortable and helps loosen his congestion- but does cause him to cough often. He has not started using his nebulizer or pulmicort yet- he plans to start today after our education session. Lungs clear but slighly diminished. Left lower left leg edema noted. He is not wearing compression socks because they are too small and difficult to get on by himself. I will see if he can get a proper fitting for same and a gator device to help with putting them on. Vitals as noted. Weight up 3 lbs from last week. He missed three daily doses of medications over the weekend. We discussed med compliance and importance of same. He says he understands. He  explained he got short of breath while walking into [employer] yesterday from a handicap parking space with his oxygen on he was very short of breath. I advised him the pairing of the vest with the nebulizer and inhalers and oxygen should help improve these things as long as he is compliant. He verbalized understanding. Appointments reviewed. I plan to see Bruce [name] in one week. Home visit complete.   BP 102/68   Pulse 90   Resp 18   Wt 159 lb (72.1 kg)   SpO2 (!) 85%   BMI 20.98 kg/m  Weight yesterday-- didn't weigh  Last visit weight-- 156lbs     ACTION: Home visit completed     Patient Care Team: Miki Kins, FNP as PCP - General (Family Medicine) Laurier Nancy, MD as PCP - Cardiology (Cardiology) Redge Gainer, RN as [facility] Care Management  Patient Active Problem List   Diagnosis Date Noted   Acute on chronic respiratory failure with hypoxia (HCC) 04/19/2023   Acute decompensated heart failure (HCC) 04/18/2023   Prolonged Q-T interval on ECG 04/18/2023   Influenza A 04/18/2023   Cor pulmonale (chronic) (HCC) 04/18/2023   Intermittent claudication (HCC) 12/18/2022   B12 deficiency 12/18/2022   MDD (major depressive disorder), recurrent episode, moderate (HCC) 09/01/2022   Pulmonary HTN, 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   Acute on chronic systolic congestive heart failure (HCC) 04/13/2022   Pulmonary embolism (HCC) 04/09/2022  PAF (paroxysmal atrial fibrillation) (HCC)    NSTEMI (non-ST elevated myocardial infarction) (HCC) 10/10/2018   Syncope 04/28/2018   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   Limb ischemia 11/18/2013   Acute blood loss anemia 10/08/2013   Anxiety disorder 10/08/2013   Dyslipidemia 10/08/2013   GERD (gastroesophageal reflux disease) 10/08/2013   Anticoagulated 10/08/2013   Hypertension    CAD (coronary artery disease) 08/16/2010   COPD (chronic obstructive pulmonary disease) (HCC) 08/16/2010   DVT of leg (deep venous thrombosis) (HCC) 08/16/2010    Current  Outpatient Medications:    acetaminophen (TYLENOL) 500 MG tablet, Take 2 tablets (1,000 mg total) by mouth every 6 (six) hours as needed for mild pain (pain score 1-3), moderate pain (pain score 4-6), fever or headache., Disp: 30 tablet, Rfl: 0   albuterol (VENTOLIN HFA) 108 (90 Base) MCG/ACT inhaler, TAKE TWO PUFFS BY MOUTH AS NEEDED FOR WHEEZING EVERY 4-6 HOURS, Disp: 8.5 g, Rfl: 5   ALPRAZolam (XANAX) 0.5 MG tablet, Take 1 tablet (0.5 mg total) by mouth 2 (two) times daily as needed for anxiety., Disp: 60 tablet, Rfl: 1   amiodarone (PACERONE) 200 MG tablet, Take 0.5 tablets (100 mg total) by mouth daily. (Patient taking differently: Take 100 mg by mouth in the morning.), Disp: 15 tablet, Rfl: 11   apixaban (ELIQUIS) 5 MG TABS tablet, TAKE ONE TABLET (5 MG TOTAL) BY MOUTH TWO (TWO) TIMES DAILY. (Patient taking differently: Take 5 mg by mouth 2 (two) times daily.), Disp: 60 tablet, Rfl: 11   budesonide (PULMICORT) 0.25 MG/2ML nebulizer solution, Take 2 mLs (0.25 mg total) by nebulization 2 (two) times daily., Disp: 60 mL, Rfl: 12   cetirizine (ZYRTEC) 10 MG tablet, Take 1 tablet (10 mg total) by mouth daily. (Patient taking differently: Take 10 mg by mouth in the morning.), Disp: 30 tablet, Rfl: 11   cholecalciferol (VITAMIN D3) 25 MCG (1000 UNIT) tablet, Take 1,000 Units by mouth in the morning., Disp: , Rfl:    digoxin (LANOXIN) 0.125 MG tablet, TAKE ONE TABLET (0.125 MG TOTAL) BY MOUTH DAILY. (Patient taking differently: Take 0.125 mg by mouth in the morning.), Disp: 30 tablet, Rfl: 6   FARXIGA 10 MG TABS tablet, TAKE ONE TABLET (10 MG TOTAL) BY MOUTH DAILY. (Patient taking differently: Take 10 mg by mouth in the morning.), Disp: 90 tablet, Rfl: 1   fluticasone (FLONASE) 50 MCG/ACT nasal spray, Place 1 spray into both nostrils daily as needed for allergies., Disp: , Rfl:    gabapentin (NEURONTIN) 100 MG capsule, TAKE ONE CAPSULE (100 MG TOTAL) BY MOUTH THREE (THREE) TIMES DAILY., Disp: 90 capsule,  Rfl: 1   lidocaine (LIDODERM) 5 %, Place 1 patch onto the skin daily. Remove & Discard patch within 12 hours or as directed by MD, Disp: 30 patch, Rfl: 11   Multiple Vitamin (MULTIVITAMIN) tablet, Take 1 tablet by mouth in the morning. For Men, Disp: , Rfl:    nitroGLYCERIN (NITROSTAT) 0.4 MG SL tablet, Place 0.4 mg under the tongue every 5 (five) minutes as needed for chest pain., Disp: , Rfl:    Omega-3 Fatty Acids (FISH OIL PO), Take 1,000 mg by mouth every evening., Disp: , Rfl:    omeprazole (PRILOSEC) 40 MG capsule, Take 1 capsule (40 mg total) by mouth 2 (two) times daily., Disp: 180 capsule, Rfl: 1   pantoprazole (PROTONIX) 40 MG tablet, Take 1 tablet (40 mg total) by mouth daily. (Patient taking differently: Take 40 mg by mouth in the morning.), Disp: 90 tablet, Rfl:  3   ranolazine (RANEXA) 500 MG 12 hr tablet, TAKE ONE TABLET (500 MG TOTAL) BY MOUTH TWO (TWO) TIMES DAILY. (Patient taking differently: Take 500 mg by mouth 2 (two) times daily.), Disp: 60 tablet, Rfl: 6   rOPINIRole (REQUIP) 1 MG tablet, TAKE ONE TABLET (1 MG TOTAL) BY MOUTH IN THE MORNING AND AT BEDTIME. (Patient taking differently: Take 1 mg by mouth 2 (two) times daily.), Disp: 60 tablet, Rfl: 3   rosuvastatin (CRESTOR) 20 MG tablet, Take 1 tablet (20 mg total) by mouth daily., Disp: 30 tablet, Rfl: 2   sildenafil (REVATIO) 20 MG tablet, Take 1 tablet (20 mg total) by mouth 3 (three) times daily., Disp: 90 tablet, Rfl: 5   Tiotropium Bromide-Olodaterol (STIOLTO RESPIMAT) 2.5-2.5 MCG/ACT AERS, Inhale 2 puffs into the lungs daily., Disp: 4 g, Rfl: 12 No current facility-administered medications for this visit.  Facility-Administered Medications Ordered in Other Visits:    albuterol (PROVENTIL) (2.5 MG/3ML) 0.083% nebulizer solution 2.5 mg, 2.5 mg, Nebulization, Once, Raechel Chute, MD Allergies  Allergen Reactions   Adhesive [Tape] Other (See Comments)    After right leg fracture surgery, pt developed a large blister  where tape was applied to his right leg. OK to use paper tape.   Eggs-Apples-Oats [Alitraq] Other (See Comments)    Caused chest pains   Imdur [Isosorbide Dinitrate] Other (See Comments)    hallucinations   Singulair [Montelukast Sodium] Other (See Comments)    Hallucinations      Social History   Socioeconomic History   Marital status: Single    Spouse name: Not on file   Number of children: 2   Years of education: Not on file   Highest education level: 6th grade  Occupational History   Not on file  Tobacco Use   Smoking status: Former    Current packs/day: 0.00    Average packs/day: 0.5 packs/day for 52.0 years (26.0 ttl pk-yrs)    Types: Cigarettes    Start date: 07/05/1970    Quit date: 07/05/2022    Years since quitting: 0.8   Smokeless tobacco: Never  Vaping Use   Vaping status: Never Used  Substance and Sexual Activity   Alcohol use: No    Alcohol/week: 0.0 standard drinks of alcohol    Comment: Stopped 2009   Drug use: No   Sexual activity: Not Currently  Other Topics Concern   Not on file  Social History Narrative   ** Merged History Encounter **       Social Drivers of Health   Financial Resource Strain: High Risk (04/12/2022)   Overall Financial Resource Strain (CARDIA)    Difficulty of Paying Living Expenses: Hard  Food Insecurity: Food Insecurity Present (04/26/2023)   Hunger Vital Sign    Worried About Running Out of Food in the Last Year: Sometimes true    Ran Out of Food in the Last Year: Never true  Transportation Needs: No Transportation Needs (04/26/2023)   PRAPARE - Administrator, Civil Service (Medical): No    Lack of Transportation (Non-Medical): No  Physical Activity: Inactive (02/05/2017)   Exercise Vital Sign    Days of Exercise per Week: 0 days    Minutes of Exercise per Session: 0 min  Stress: Stress Concern Present (02/05/2017)   [employer] of Occupational Health - Occupational Stress Questionnaire    Feeling of  Stress : Rather much  Social Connections: Unknown (04/19/2023)   Social Connection and Isolation Panel [NHANES]  Frequency of Communication with Friends and Family: Twice a week    Frequency of Social Gatherings with Friends and Family: Once a week    Attends Religious Services: Patient declined    Database administrator or Organizations: No    Attends Banker Meetings: Never    Marital Status: Patient declined  Intimate Partner Violence: Not At Risk (04/26/2023)   Humiliation, Afraid, Rape, and Kick questionnaire    Fear of Current or Ex-Partner: No    Emotionally Abused: No    Physically Abused: No    Sexually Abused: No    Physical Exam      Future Appointments  Date Time Provider Department Center  05/10/2023  1:15 PM Homsher, Wynona Canes, RN [facility] None  05/15/2023 11:00 AM Noemi Chapel, NP [facility] None  05/30/2023  9:00 AM [facility] PA/NP [facility] None  06/01/2023 10:15 AM Miki Kins, FNP [facility] None

## 2023-05-10 ENCOUNTER — Other Ambulatory Visit: Payer: Self-pay

## 2023-05-10 NOTE — Patient Outreach (Signed)
 Care Management  Transitions of Care Program Transitions of Care Post-discharge week 3   05/10/2023 Name: Bruce [name] MRN: [id number] DOB: 1945/12/24  Subjective: Bruce [name] is a 78 y.o. year old male who is a primary care patient of Miki Kins, FNP. The Care Management team Engaged with patient Engaged with patient by telephone to assess and address transitions of care needs.   Consent to Services:  Patient was given information about care management services, agreed to services, and gave verbal consent to participate.   Assessment: TOC Outreach completed to the patient. His Paramedicine team went to see him yesterday. He was SOB despite being on 3L of oxygen. The patient went out of town for three days and did not take his pillbox with him so he missed three days of medication. The team filled his pillbox and stressed the importance of taking his medication. He has been given Afflo vibrating vest to help keep his lungs clear. Edema to RLE. Compression stockings have been ordered for him. He complains about some money being taken out of his SS check and hardships of paying bills. He does not want to spend time contacting SS Administration regarding his finances. SW has reached out to the patient to assist. He became emotional and states he doesn't like to ask for help. Discussed ACP with the patient. He has a burial plot in [place], [place] and he states he does not want to be resuscitated if his heart stops and he does not want to be kept alive on machines. He has a Pulmonary appointment next week. RNCM to follow up.      SDOH Interventions    Flowsheet Row Patient Outreach from 05/10/2023 in [place] POPUL[facility] DEPARTMENT Telephone from 04/26/2023 in [place] POPULATION HE[facility] DEPARTMENT ED to Hosp-Admission (Discharged) from 04/18/2023 in [facility] 3E HF PCU Care Coordination from 02/08/2023 in [facility] Community Care Coordination Telephone from  11/16/2022 in [facility] Established CHF 20 from 08/03/2022 in [facility]  SDOH Interventions        Food Insecurity Interventions AMB Referral AMB Referral Inpatient TOC, Community Resources Provided Other (Comment)  [patient states he has a U card that he uses for food/ groceries.] -- Other (Comment)  [H&V food bag]  Housing Interventions Intervention Not Indicated Intervention Not Indicated Inpatient TOC, Community Resources Provided Other (Comment)  [patient states he is looking for housing. He reports having assistance. Declines RNCM offer to referral to social worker for housing assistance.] Other (Comment)  [sent affordable housing options] --  Transportation Interventions Intervention Not Indicated Intervention Not Indicated -- -- -- --  Utilities Interventions Intervention Not Indicated Intervention Not Indicated -- -- -- --        Goals Addressed             This Visit's Progress    TOC Care Plan   Not on track    Current Barriers: (reviewed 05/10/23) Knowledge Deficits related to plan of care for management of CHF  Care Coordination needs related to Limited social support, Medication procurement, and Limited education about Heart Failure and the need for medications Chronic Disease Management support and education needs related to CHF  Lacks caregiver support Non-adherence to prescribed medication regimen  RNCM Clinical Goal(s):   (reviewed 05/10/23) Patient will work with the Care Management team over the next 30 days to address Transition of Care Barriers: Medication access Medication  Management Diet/Nutrition/Food Resources Provider appointments Home Health services Functional/Safety verbalize understanding of plan for management of CHF and COPD as evidenced by following the discharge instructions and by taking your medications demonstrate understanding of rationale for each prescribed  medication as evidenced by taking the medication as directed   Work through collaboration with Medical illustrator, provider, including the Paramedicine team that comes by weekly   Interventions:  (reviewed 05/10/23) Evaluation of current treatment plan related to  self management and patient's adherence to plan as established by provider   Heart Failure Interventions:  ( Goal on track:  NO.) Short Term Goal (reviewed 05/10/23) Basic overview and discussion of pathophysiology of Heart Failure reviewed Provided education on low sodium diet Advised patient to weigh each morning after emptying bladder Discussed importance of daily weight and advised patient to weigh and record daily Reviewed role of diuretics in prevention of fluid overload and management of heart failure; Discussed the importance of keeping all appointments with provider The patient does not weigh because he cannot see the scales. His Paramedicine provider will check for CHF weekly  and will weigh him Additional assistance for compliance and medication management with Paramedicine  Patient Goals/Self-Care Activities:  (reviewed 05/10/23) Participate in Transition of Care Program/Attend [facility] scheduled calls Notify RN Care Manager of [facility] call rescheduling needs Take all medications as prescribed Attend all scheduled provider appointments Call pharmacy for medication refills 3-7 days in advance of running out of medications Wear compression stockings daily  Follow Up Plan:  Telephone follow up appointment with care management team member scheduled for:  Thursday March 13 at 1:15pm         Plan: The patient has been provided with contact information for the care management team and has been advised to call with any health related questions or concerns.   Please refer to Care Plan for goals and interventions.  Patient educated on red flags signs/symptoms to watch for and was encouraged to report any of these identified, any new  symptoms, changes in baseline or medication regimen, change in health status / well-being, or safety concerns to PCP and / or the [facility] Case Management team.     Deidre Ala, BSN, RN   [facility] - [facility] RN Care Manager [phone]

## 2023-05-15 ENCOUNTER — Inpatient Hospital Stay: Payer: 59 | Admitting: Nurse Practitioner

## 2023-05-16 ENCOUNTER — Other Ambulatory Visit (HOSPITAL_COMMUNITY): Payer: Self-pay

## 2023-05-16 NOTE — Progress Notes (Signed)
 Paramedicine Encounter    Patient ID: Bruce [name], male    DOB: Apr 14, 1945, 78 y.o.   MRN: [id number]   Complaints- headache, dry mouth, leg pain.   Assessment- CAOX4, laying in bed wearing nasal cannula short of breath on minimal exertion. Lower leg edema in left leg. Lung sounds diminished. Vitals as noted- around baseline. No weight gain.    Compliance with meds- pill box empty indicating he took all his medications   Pill box filled- for one week   Refills needed-  Albuterol inhaler  Flonase  Multivitamin   Meds changes since last visit- none     Social changes- none    VISIT SUMMARY- Arrived for home visit for [facility] who was laying supine in his bed wearing his nasal cannula breathing heavily. He reports having shortness of breath on minimal exertion. He says he has been using his nebulizer however the machine is still in its place I left it last week after educating him and no medications have been removed from the box to have taken it. He reports being compliant with his meds- pill box empty indicating he took them all. He has been known in the past to have thrown them away.   Cigarettes in ashtray on top of oxygen concentrator( he reports aren't his) I educated him on same and the dangers of smoking while on oxygen or near it. He verbalized understanding. He reports having a headache but has taken no tylenol for relief. Also feels like it is seasonal allergies but has a claritin in his pill box daily for same- this also leads me to believe he is non compliant with meds in addition to his symptoms. I educated him on same. Appointment for [facility] was missed yesterday I advised him to call and reschedule providing him with the number. I also gave him the number to [facility] to schedule an annual eye exam. He verbalized understanding. Home visit complete. I will see Bruce [name] in one week.   BP 110/70   Pulse 87   Resp 16   Wt 159 lb (72.1 kg)   SpO2 90%   BMI  20.98 kg/m  Weight yesterday-- didn't weigh  Last visit weight-- 158lbs      ACTION: Home visit completed     Patient Care Team: Miki Kins, FNP as PCP - General (Family Medicine) Laurier Nancy, MD as PCP - Cardiology (Cardiology) Redge Gainer, RN as [facility] : [facility] Care Management  Patient Active Problem List   Diagnosis Date Noted   Acute on chronic respiratory failure with hypoxia (HCC) 04/19/2023   Acute decompensated heart failure (HCC) 04/18/2023   Prolonged Q-T interval on ECG 04/18/2023   Influenza A 04/18/2023   Cor pulmonale (chronic) (HCC) 04/18/2023   Intermittent claudication (HCC) 12/18/2022   B12 deficiency 12/18/2022   MDD (major depressive disorder), recurrent episode, moderate (HCC) 09/01/2022   Pulmonary HTN, 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   Acute on chronic systolic congestive heart failure (HCC) 04/13/2022   Pulmonary embolism (HCC) 04/09/2022   PAF (paroxysmal atrial fibrillation) (HCC)    NSTEMI (non-ST elevated myocardial infarction) (HCC) 10/10/2018   Syncope 04/28/2018   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   Limb ischemia 11/18/2013   Acute blood loss anemia 10/08/2013   Anxiety disorder 10/08/2013   Dyslipidemia 10/08/2013   GERD (gastroesophageal reflux disease) 10/08/2013   Anticoagulated 10/08/2013   Hypertension    CAD (coronary artery disease) 08/16/2010   COPD (chronic obstructive pulmonary disease) (HCC) 08/16/2010   DVT of  leg (deep venous thrombosis) (HCC) 08/16/2010    Current Outpatient Medications:    acetaminophen (TYLENOL) 500 MG tablet, Take 2 tablets (1,000 mg total) by mouth every 6 (six) hours as needed for mild pain (pain score 1-3), moderate pain (pain score 4-6), fever or headache., Disp: 30 tablet, Rfl: 0   albuterol (VENTOLIN HFA) 108 (90 Base) MCG/ACT inhaler, TAKE TWO PUFFS BY MOUTH AS NEEDED FOR WHEEZING EVERY 4-6 HOURS, Disp: 8.5 g, Rfl: 5   ALPRAZolam (XANAX) 0.5 MG tablet, Take 1 tablet (0.5 mg total) by mouth 2 (two) times  daily as needed for anxiety., Disp: 60 tablet, Rfl: 1   amiodarone (PACERONE) 200 MG tablet, Take 0.5 tablets (100 mg total) by mouth daily. (Patient taking differently: Take 100 mg by mouth in the morning.), Disp: 15 tablet, Rfl: 11   apixaban (ELIQUIS) 5 MG TABS tablet, TAKE ONE TABLET (5 MG TOTAL) BY MOUTH TWO (TWO) TIMES DAILY. (Patient taking differently: Take 5 mg by mouth 2 (two) times daily.), Disp: 60 tablet, Rfl: 11   budesonide (PULMICORT) 0.25 MG/2ML nebulizer solution, Take 2 mLs (0.25 mg total) by nebulization 2 (two) times daily., Disp: 60 mL, Rfl: 12   cetirizine (ZYRTEC) 10 MG tablet, Take 1 tablet (10 mg total) by mouth daily. (Patient taking differently: Take 10 mg by mouth in the morning.), Disp: 30 tablet, Rfl: 11   cholecalciferol (VITAMIN D3) 25 MCG (1000 UNIT) tablet, Take 1,000 Units by mouth in the morning., Disp: , Rfl:    digoxin (LANOXIN) 0.125 MG tablet, TAKE ONE TABLET (0.125 MG TOTAL) BY MOUTH DAILY. (Patient taking differently: Take 0.125 mg by mouth in the morning.), Disp: 30 tablet, Rfl: 6   FARXIGA 10 MG TABS tablet, TAKE ONE TABLET (10 MG TOTAL) BY MOUTH DAILY. (Patient taking differently: Take 10 mg by mouth in the morning.), Disp: 90 tablet, Rfl: 1   fluticasone (FLONASE) 50 MCG/ACT nasal spray, Place 1 spray into both nostrils daily as needed for allergies., Disp: , Rfl:    gabapentin (NEURONTIN) 100 MG capsule, TAKE ONE CAPSULE (100 MG TOTAL) BY MOUTH THREE (THREE) TIMES DAILY., Disp: 90 capsule, Rfl: 1   lidocaine (LIDODERM) 5 %, Place 1 patch onto the skin daily. Remove & Discard patch within 12 hours or as directed by MD, Disp: 30 patch, Rfl: 11   Multiple Vitamin (MULTIVITAMIN) tablet, Take 1 tablet by mouth in the morning. For Men, Disp: , Rfl:    nitroGLYCERIN (NITROSTAT) 0.4 MG SL tablet, Place 0.4 mg under the tongue every 5 (five) minutes as needed for chest pain., Disp: , Rfl:    Omega-3 Fatty Acids (FISH OIL PO), Take 1,000 mg by mouth every evening.,  Disp: , Rfl:    omeprazole (PRILOSEC) 40 MG capsule, Take 1 capsule (40 mg total) by mouth 2 (two) times daily., Disp: 180 capsule, Rfl: 1   pantoprazole (PROTONIX) 40 MG tablet, Take 1 tablet (40 mg total) by mouth daily. (Patient taking differently: Take 40 mg by mouth in the morning.), Disp: 90 tablet, Rfl: 3   ranolazine (RANEXA) 500 MG 12 hr tablet, TAKE ONE TABLET (500 MG TOTAL) BY MOUTH TWO (TWO) TIMES DAILY. (Patient taking differently: Take 500 mg by mouth 2 (two) times daily.), Disp: 60 tablet, Rfl: 6   rOPINIRole (REQUIP) 1 MG tablet, TAKE ONE TABLET (1 MG TOTAL) BY MOUTH IN THE MORNING AND AT BEDTIME. (Patient taking differently: Take 1 mg by mouth 2 (two) times daily.), Disp: 60 tablet, Rfl: 3   rosuvastatin (  CRESTOR) 20 MG tablet, Take 1 tablet (20 mg total) by mouth daily., Disp: 30 tablet, Rfl: 2   sildenafil (REVATIO) 20 MG tablet, Take 1 tablet (20 mg total) by mouth 3 (three) times daily., Disp: 90 tablet, Rfl: 5   Tiotropium Bromide-Olodaterol (STIOLTO RESPIMAT) 2.5-2.5 MCG/ACT AERS, Inhale 2 puffs into the lungs daily., Disp: 4 g, Rfl: 12 No current facility-administered medications for this visit.  Facility-Administered Medications Ordered in Other Visits:    albuterol (PROVENTIL) (2.5 MG/3ML) 0.083% nebulizer solution 2.5 mg, 2.5 mg, Nebulization, Once, Raechel Chute, MD Allergies  Allergen Reactions   Adhesive [Tape] Other (See Comments)    After right leg fracture surgery, pt developed a large blister where tape was applied to his right leg. OK to use paper tape.   Eggs-Apples-Oats [Alitraq] Other (See Comments)    Caused chest pains   Imdur [Isosorbide Dinitrate] Other (See Comments)    hallucinations   Singulair [Montelukast Sodium] Other (See Comments)    Hallucinations      Social History   Socioeconomic History   Marital status: Single    Spouse name: Not on file   Number of children: 2   Years of education: Not on file   Highest education level: 6th  grade  Occupational History   Not on file  Tobacco Use   Smoking status: Former    Current packs/day: 0.00    Average packs/day: 0.5 packs/day for 52.0 years (26.0 ttl pk-yrs)    Types: Cigarettes    Start date: 07/05/1970    Quit date: 07/05/2022    Years since quitting: 0.8   Smokeless tobacco: Never  Vaping Use   Vaping status: Never Used  Substance and Sexual Activity   Alcohol use: No    Alcohol/week: 0.0 standard drinks of alcohol    Comment: Stopped 2009   Drug use: No   Sexual activity: Not Currently  Other Topics Concern   Not on file  Social History Narrative   ** Merged History Encounter **       Social Drivers of Health   Financial Resource Strain: High Risk (04/12/2022)   Overall Financial Resource Strain (CARDIA)    Difficulty of Paying Living Expenses: Hard  Food Insecurity: Food Insecurity Present (05/10/2023)   Hunger Vital Sign    Worried About Running Out of Food in the Last Year: Sometimes true    Ran Out of Food in the Last Year: Often true  Transportation Needs: No Transportation Needs (05/10/2023)   PRAPARE - Administrator, Civil Service (Medical): No    Lack of Transportation (Non-Medical): No  Physical Activity: Inactive (02/05/2017)   Exercise Vital Sign    Days of Exercise per Week: 0 days    Minutes of Exercise per Session: 0 min  Stress: Stress Concern Present (02/05/2017)   [employer] of Occupational Health - Occupational Stress Questionnaire    Feeling of Stress : Rather much  Social Connections: Unknown (04/19/2023)   Social Connection and Isolation Panel [NHANES]    Frequency of Communication with Friends and Family: Twice a week    Frequency of Social Gatherings with Friends and Family: Once a week    Attends Religious Services: Patient declined    Database administrator or Organizations: No    Attends Banker Meetings: Never    Marital Status: Patient declined  Intimate Partner Violence: Not At Risk (05/10/2023)    Humiliation, Afraid, Rape, and Kick questionnaire    Fear of Current  or Ex-Partner: No    Emotionally Abused: No    Physically Abused: No    Sexually Abused: No    Physical Exam      Future Appointments  Date Time Provider Department Center  05/17/2023  1:15 PM Homsher, Wynona Canes, RN [facility] None  05/30/2023  9:00 AM [facility] PA/NP [facility] None  06/01/2023 10:15 AM Miki Kins, FNP [facility] None

## 2023-05-17 ENCOUNTER — Other Ambulatory Visit: Payer: Self-pay

## 2023-05-17 NOTE — Patient Outreach (Signed)
 Care Management  Transitions of Care Program Transitions of Care Post-discharge week 4   05/17/2023 Name: YARED BAREFOOT MRN: [id number] DOB: November 26, 1945  Subjective: Bruce [name] is a 78 y.o. year old male who is a primary care patient of Miki Kins, FNP. The Care Management team Engaged with patient Engaged with patient by telephone to assess and address transitions of care needs.   Consent to Services:  Patient was given information about care management services, agreed to services, and gave verbal consent to participate.   Assessment: TOC Outreach completed to the patient. He states he is lying in bed watching TV. He is SOB even without exertion. He states his day consists of getting up, going to the bathroom, drinking a glass of milk and then going back to his room where he lays in bed. He states he has a difficult time making it from the bedroom to the kitchen without having to stop and breathe. He will turn up his Oxygen to 4L if needed. He states he has no quality of life. His roommate continues to smoke in the house despite the oxygen and took down the signs on the front door noting the danger. The patient would like to move someplace else but he doesn't think he can afford it. Educated on the progression of his disease and that his COPD would not reverse itself. Education on prevention and how he needs to be compliant with the treatment program.     SDOH Interventions    Flowsheet Row Patient Outreach from 05/10/2023 in [place] POPULATION HEALTH DEPARTMENT Telephone from 04/26/2023 in [place] POPULATION HEALTH DEPARTMENT ED to Hosp-Admission (Discharged) from 04/18/2023 in [facility] 3E HF PCU Care Coordination from 02/08/2023 in [facility] Community Care Coordination Telephone from 11/16/2022 in [facility] Established CHF 20 from 08/03/2022 in [facility]  SDOH  Interventions        Food Insecurity Interventions AMB Referral AMB Referral Inpatient TOC, Community Resources Provided Other (Comment)  [patient states he has a U card that he uses for food/ groceries.] -- Other (Comment)  [H&V food bag]  Housing Interventions Intervention Not Indicated Intervention Not Indicated Inpatient TOC, Community Resources Provided Other (Comment)  [patient states he is looking for housing. He reports having assistance. Declines RNCM offer to referral to social worker for housing assistance.] Other (Comment)  [sent affordable housing options] --  Transportation Interventions Intervention Not Indicated Intervention Not Indicated -- -- -- --  Utilities Interventions Intervention Not Indicated Intervention Not Indicated -- -- -- --        Goals Addressed             This Visit's Progress    TOC Care Plan   Not on track    Current Barriers: (reviewed 05/17/23) Knowledge Deficits related to plan of care for management of CHF  Care Coordination needs related to Limited social support, Medication procurement, and Limited education about Heart Failure and the need for medications Chronic Disease Management support and education needs related to CHF  Lacks caregiver support Non-adherence to prescribed medication regimen Lack of follow through on recommendations and education  RNCM Clinical Goal(s):   (reviewed 05/17/23) Patient will work with the Care Management team over the next 30 days to address Transition of Care Barriers: Medication access Medication Management Diet/Nutrition/Food Resources Provider appointments Functional/Safety verbalize understanding of plan for management of CHF and COPD as evidenced by  following the discharge instructions and by taking your medications demonstrate understanding of rationale for each prescribed medication as evidenced by taking the medication as directed  Work through collaboration with Medical illustrator, provider, including  the Paramedicine team that comes by weekly   Interventions:  (reviewed 05/17/23) Evaluation of current treatment plan related to  self management and patient's adherence to plan as established by provider   Heart Failure Interventions:  ( Goal on track:  NO.) Short Term Goal (reviewed 05/17/23) Basic overview and discussion of pathophysiology of Heart Failure reviewed Provided education on low sodium diet Advised patient to weigh each morning after emptying bladder Discussed importance of daily weight and advised patient to weigh and record daily Reviewed role of diuretics in prevention of fluid overload and management of heart failure; Discussed the importance of keeping all appointments with provider The patient does not weigh because he cannot see the scales. His Paramedicine provider will check for CHF weekly  and will weigh him Additional assistance for compliance and medication management with Paramedicine Adding compression garments to the regime daily  Patient Goals/Self-Care Activities:  (reviewed 05/17/23) Participate in Transition of Care Program/Attend [facility] scheduled calls Notify RN Care Manager of [facility] call rescheduling needs Take all medications as prescribed Attend all scheduled provider appointments Call pharmacy for medication refills 3-7 days in advance of running out of medications Wear compression stockings daily  Follow Up Plan:  Telephone follow up appointment with care management team member scheduled for:  Thursday March 20 at 1:15pm        Routine follow-up and on-going assessment evaluation and education of disease processes, and recommended interventions for both chronic and acute medical conditions, will occur during each weekly visit during [facility] 30-day Program Outreach calls along with ongoing review of symptoms, medication reviews and reconciliation. Any updates, inconsistencies, discrepancies or acute care concerns will be addressed on the Care Plan and routed  to the correct Practitioner if indicated.    The patient has been provided with contact information for the care management team and has been advised to call with any health-related questions or concerns. The patient verbalized understanding with current POC. The patient is directed to their insurance card regarding availability of benefits coverage.  Plan: The patient has been provided with contact information for the care management team and has been advised to call with any health related questions or concerns.   Deidre Ala, BSN, RN [place]  VBCI - [employer] [facility] RN Care Manager [phone]

## 2023-05-18 ENCOUNTER — Other Ambulatory Visit: Payer: Self-pay

## 2023-05-18 MED ORDER — MEDICAL COMPRESSION STOCKINGS MISC
0 refills | Status: DC
Start: 2023-05-18 — End: 2023-07-03

## 2023-05-19 DIAGNOSIS — G629 Polyneuropathy, unspecified: Secondary | ICD-10-CM | POA: Insufficient documentation

## 2023-05-19 NOTE — Progress Notes (Signed)
 Established Patient Office Visit  Subjective:  Patient ID: Bruce [name], male    DOB: [birth year]  Age: 78 y.o. MRN: [id number]  Chief Complaint  Patient presents with   Follow-up    2 week    Patient is here today for his 3 months follow up.  He has been feeling fairly well since last appointment.   He does not have additional concerns to discuss today.  Doing better since last appointment, and says that his anxiety is better managed. Dyspnea has decreased.  Labs are due today. He needs refills.   I have reviewed his active problem list, medication list, allergies, health maintenance, notes from last encounter, lab results for his appointment today.   No other concerns at this time.   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 01/01/2015   Complication of anesthesia    wake up with a head ache   COPD (chronic obstructive pulmonary disease) (HCC)    Coronary artery disease    Edema 08/16/2010   GERD (gastroesophageal reflux disease)    Headache    related to sinus congestion   History of blood transfusion    History of kidney stones    History of MI (myocardial infarction) 10/08/2013   Hypertension    Hypokalemia 04/09/2022   Intracranial bleed (HCC) 12/31/2018   Lactic acidosis 04/09/2022   Leg cramps 08/16/2010   Malignant neoplasm of prostate (HCC) 07/13/2016   Multiple closed facial bone fractures (HCC)    Myocardial infarction [facility])    '09 AND '12   Peripheral vascular disease (HCC)    Shortness of breath    With exertion .   Tibia/fibula  fracture 10/06/2013   Tibial plateau fracture 12/29/2014   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HCC) 10/08/2013   UTI (urinary tract infection)    frequent UTI   Visual hallucination 03/18/2015    Past Surgical History:  Procedure Laterality Date   CARDIAC CATHETERIZATION     CHOLECYSTECTOMY     EXTERNAL FIXATION LEG Right 10/06/2013   Procedure: CLOSED REDUCTION RIGHT TIBIAL PLATEAU FRACTURE, EXTERNAL FIXATION RIGHT LEG, PLACEMENT OF WOUND VAC;  Surgeon: Budd Palmer, MD;  Location: [facility] OR;  Service: Orthopedics;  Laterality: Right;   FEMORAL-POPLITEAL BYPASS GRAFT Right 10/06/2013   Procedure: RIGHT POPLITEAL-POPLITEAL ARTERY BYPASS GRAFT;  Surgeon: Larina Earthly, MD;  Location: [facility] OR;  Service: Vascular;  Laterality: Right;   FRACTURE SURGERY Right 2014   HARDWARE REMOVAL Right 12/02/2013   Procedure: REMOVAL EXTERNAL FIXATION RIGHT LEG ;  Surgeon: Budd Palmer, MD;  Location: [facility] OR;  Service: Orthopedics;  Laterality: Right;   HERNIA REPAIR Right 1990's   I & D EXTREMITY Right 10/09/2013   Procedure: IRRIGATION AND DEBRIDEMENT RIGHT LEG, CLOSURE  OF WOUNDS, PLACEMENT OF WOUND VAC ON EACH SIDE OF LEG;  Surgeon: Budd Palmer, MD;  Location: [facility] OR;  Service: Orthopedics;  Laterality: Right;   IR ANGIOGRAM PULMONARY RIGHT SELECTIVE  04/10/2022   IR ANGIOGRAM SELECTIVE EACH ADDITIONAL VESSEL  04/10/2022   IR NEPHROSTOMY PLACEMENT LEFT  06/19/2016   IR THROMBECT PRIM MECH INIT (INCLU) MOD SED  04/10/2022   IR US GUIDE VASC ACCESS RIGHT  04/10/2022   IVC filter  2009   placed @ [facility]/ Removed in 2010.   IVC Filter Removed     LEFT HEART CATH AND CORONARY ANGIOGRAPHY Right 10/11/2018   Procedure: LEFT HEART CATH AND CORONARY ANGIOGRAPHY;  Surgeon: Laurier Nancy, MD;  Location: [facility] INVASIVE CV LAB;  Service: Cardiovascular;  Laterality: Right;   ligament leg Left    NEPHROLITHOTOMY Left 06/19/2016   Procedure: NEPHROLITHOTOMY PERCUTANEOUS;  Surgeon: Vanna Scotland, MD;  Location: [facility] ORS;  Service:  Urology;  Laterality: Left;   ORIF TIBIA FRACTURE Right 12/29/2014   Procedure: OPEN REDUCTION INTERNAL FIXATION (ORIF) RIGHT TIBIA FRACTURE, RIA VS ICBG;  Surgeon: Myrene Galas, MD;  Location: [facility] OR;  Service: Orthopedics;  Laterality: Right;   RADIOACTIVE SEED IMPLANT N/A 09/12/2016   Procedure: RADIOACTIVE SEED IMPLANT/BRACHYTHERAPY IMPLANT;  Surgeon: Vanna Scotland, MD;  Location: [facility] ORS;  Service: Urology;  Laterality: N/A;   RIGHT/LEFT HEART CATH AND CORONARY ANGIOGRAPHY N/A 04/13/2022   Procedure: RIGHT/LEFT HEART CATH AND CORONARY ANGIOGRAPHY;  Surgeon: Dolores Patty, MD;  [facility] LAB;  Service: Cardiovascular;  Laterality: N/A;    Social History   Socioeconomic History   Marital status: Single    Spouse name: Not on file   Number of children: 2   Years of education: Not on file   Highest education level: 6th grade  Occupational History   Not on file  Tobacco Use   Smoking status: Former    Current packs/day: 0.00    Average packs/day: 0.5 packs/day for 52.0 years (26.0 ttl pk-yrs)    Types: Cigarettes    Start date: 07/05/1970    Quit date: 07/05/2022    Years since quitting: 0.8   Smokeless tobacco: Never  Vaping Use   Vaping status: Never Used  Substance and Sexual Activity   Alcohol use: No    Alcohol/week: 0.0 standard drinks of alcohol    Comment: Stopped 2009   Drug use: No   Sexual activity: Not Currently  Other Topics Concern   Not on file  Social History Narrative   ** Merged History Encounter **       Social Drivers of Health   Financial Resource Strain: High Risk (04/12/2022)   Overall Financial Resource Strain (CARDIA)    Difficulty of Paying Living Expenses: Hard  Food Insecurity: Food Insecurity Present (05/10/2023)   Hunger Vital Sign    Worried About Running Out of Food in the Last Year: Sometimes true    Ran Out of Food in the Last Year: Often true  Transportation Needs: No Transportation Needs (05/10/2023)   PRAPARE -  Administrator, Civil Service (Medical): No    Lack of Transportation (Non-Medical): No  Physical Activity: Inactive (02/05/2017)   Exercise Vital Sign    Days of Exercise per Week: 0 days    Minutes of Exercise per Session: 0 min  Stress: Stress Concern Present (02/05/2017)   [employer] of Occupational Health - Occupational Stress Questionnaire    Feeling of Stress : Rather much  Social Connections: Unknown (04/19/2023)   Social Connection and Isolation Panel [NHANES]    Frequency of Communication with Friends and Family: Twice a week    Frequency of Social Gatherings with Friends and Family: Once a week    Attends Religious Services:  Patient declined    Active Member of Clubs or Organizations: No    Attends Banker Meetings: Never    Marital Status: Patient declined  Intimate Partner Violence: Not At Risk (05/10/2023)   Humiliation, Afraid, Rape, and Kick questionnaire    Fear of Current or Ex-Partner: No    Emotionally Abused: No    Physically Abused: No    Sexually Abused: No    Family History  Problem Relation Age of Onset   Hypertension Mother    Prostate cancer Brother    Mental illness Neg Hx     Allergies  Allergen Reactions   Adhesive [Tape] Other (See Comments)    After right leg fracture surgery, pt developed a large blister where tape was applied to his right leg. OK to use paper tape.   Eggs-Apples-Oats [Alitraq] Other (See Comments)    Caused chest pains   Imdur [Isosorbide Dinitrate] Other (See Comments)    hallucinations   Singulair [Montelukast Sodium] Other (See Comments)    Hallucinations     Review of Systems  Respiratory:  Positive for shortness of breath.   Neurological:  Positive for dizziness (when standing up, mostly first thing in the morning).  All other systems reviewed and are negative.      Objective:   BP (!) 140/80   Pulse 88   Ht 6\' 1"  (1.854 m)   Wt 154 lb 6.4 oz (70 kg)   SpO2 90%   BMI 20.37  kg/m   Vitals:   02/19/23 1334  BP: (!) 140/80  Pulse: 88  Height: 6\' 1"  (1.854 m)  Weight: 154 lb 6.4 oz (70 kg)  SpO2: 90%  BMI (Calculated): 20.37    Physical Exam Vitals and nursing note reviewed.  Constitutional:      General: He is not in acute distress.    Appearance: Normal appearance. He is well-developed, well-groomed and normal weight. He is ill-appearing.  HENT:     Head: Normocephalic and atraumatic.     Nose: Nose normal.     Mouth/Throat:     Mouth: Mucous membranes are moist.  Eyes:     Extraocular Movements: Extraocular movements intact.     Conjunctiva/sclera: Conjunctivae normal.     Pupils: Pupils are equal, round, and reactive to light.  Cardiovascular:     Rate and Rhythm: Normal rate and regular rhythm.     Pulses: Normal pulses.     Heart sounds: Normal heart sounds.  Pulmonary:     Effort: Pulmonary effort is normal.     Breath sounds: Wheezing present.  Musculoskeletal:        General: Normal range of motion.     Cervical back: Normal range of motion.  Neurological:     General: No focal deficit present.     Mental Status: He is alert and oriented to person, place, and time. Mental status is at baseline.  Psychiatric:        Mood and Affect: Mood normal.        Behavior: Behavior is uncooperative.        Thought Content: Thought content normal.        Judgment: Judgment normal.      No results found for any visits on 02/19/23.  Recent Results (from the past 2160 hours)  I-STAT creatinine     Status: None   Collection Time: 03/27/23 11:22 AM  Result Value Ref Range   Creatinine, Ser 1.10 0.61 - 1.24 mg/dL  CBC with  Differential     Status: Abnormal   Collection Time: 04/18/23 11:39 AM  Result Value Ref Range   WBC 4.3 4.0 - 10.5 K/uL   RBC 3.73 (L) 4.22 - 5.81 MIL/uL   Hemoglobin 12.0 (L) 13.0 - 17.0 g/dL   HCT 16.1 (L) 09.6 - 04.5 %   MCV 99.5 80.0 - 100.0 fL   MCH 32.2 26.0 - 34.0 pg   MCHC 32.3 30.0 - 36.0 g/dL   RDW 40.9  81.1 - 91.4 %   Platelets 206 150 - 400 K/uL   nRBC 0.0 0.0 - 0.2 %   Neutrophils Relative % 76 %   Neutro Abs 3.3 1.7 - 7.7 K/uL   Lymphocytes Relative 11 %   Lymphs Abs 0.5 (L) 0.7 - 4.0 K/uL   Monocytes Relative 13 %   Monocytes Absolute 0.6 0.1 - 1.0 K/uL   Eosinophils Relative 0 %   Eosinophils Absolute 0.0 0.0 - 0.5 K/uL   Basophils Relative 0 %   Basophils Absolute 0.0 0.0 - 0.1 K/uL   Immature Granulocytes 0 %   Abs Immature Granulocytes 0.01 0.00 - 0.07 K/uL    Comment: Performed at [facility], [address], [place], [place] [address]  Comprehensive metabolic panel     Status: Abnormal   Collection Time: 04/18/23 11:39 AM  Result Value Ref Range   Sodium 136 135 - 145 mmol/L   Potassium 4.0 3.5 - 5.1 mmol/L   Chloride 105 98 - 111 mmol/L   CO2 22 22 - 32 mmol/L   Glucose, Bld 99 70 - 99 mg/dL    Comment: Glucose reference range applies only to samples taken after fasting for at least 8 hours.   BUN 13 8 - 23 mg/dL   Creatinine, Ser 6.21 0.61 - 1.24 mg/dL   Calcium 8.8 (L) 8.9 - 10.3 mg/dL   Total Protein 6.2 (L) 6.5 - 8.1 g/dL   Albumin 2.9 (L) 3.5 - 5.0 g/dL   AST 31 15 - 41 U/L   ALT 20 0 - 44 U/L   Alkaline Phosphatase 107 38 - 126 U/L   Total Bilirubin 1.1 0.0 - 1.2 mg/dL   GFR, Estimated >30 >86 mL/min    Comment: (NOTE) Calculated using the CKD-EPI Creatinine Equation (2021)    Anion gap 9 5 - 15    Comment: Performed at [facility] Lab, [address] [place], [place] [address]  Brain natriuretic peptide     Status: Abnormal   Collection Time: 04/18/23 11:39 AM  Result Value Ref Range   B Natriuretic Peptide 1,456.0 (H) 0.0 - 100.0 pg/mL    Comment: Performed at [facility], [address] [place], [place] [address]  Troponin I (High Sensitivity)     Status: Abnormal   Collection Time: 04/18/23 11:39 AM  Result Value Ref Range   Troponin I (High Sensitivity) 58 (H) <18 ng/L    Comment: (NOTE) Elevated high sensitivity troponin I  (hsTnI) values and significant  changes across serial measurements may suggest ACS but many other  chronic and acute conditions are known to elevate hsTnI results.  Refer to the "Links" section for chest pain algorithms and additional  guidance. Performed at [facility], [address] [place], [place] [address]   Resp panel by RT-PCR (RSV, Flu A&B, Covid) Anterior Nasal Swab     Status: Abnormal   Collection Time: 04/18/23 11:42 AM   Specimen: Anterior Nasal Swab  Result  Value Ref Range   SARS Coronavirus 2 by RT PCR NEGATIVE NEGATIVE   Influenza A by PCR POSITIVE (A) NEGATIVE   Influenza B by PCR NEGATIVE NEGATIVE    Comment: (NOTE) The Xpert Xpress SARS-CoV-2/FLU/RSV plus assay is intended as an aid in the diagnosis of influenza from Nasopharyngeal swab specimens and should not be used as a sole basis for treatment. Nasal washings and aspirates are unacceptable for Xpert Xpress SARS-CoV-2/FLU/RSV testing.  Fact Sheet for Patients: BloggerCourse.com  Fact Sheet for Healthcare Providers: SeriousBroker.it  This test is not yet approved or cleared by the [place] FDA and has been authorized for detection and/or diagnosis of SARS-CoV-2 by FDA under an Emergency Use Authorization (EUA). This EUA will remain in effect (meaning this test can be used) for the duration of the COVID-19 declaration under Section 564(b)(1) of the Act, 21 U.S.C. section 360bbb-3(b)(1), unless the authorization is terminated or revoked.     Resp Syncytial Virus by PCR NEGATIVE NEGATIVE    Comment: (NOTE) Fact Sheet for Patients: BloggerCourse.com  Fact Sheet for Healthcare Providers: SeriousBroker.it  This test is not yet approved or cleared by the [place] FDA and has been authorized for detection and/or diagnosis of SARS-CoV-2 by FDA under an Emergency Use Authorization (EUA). This  EUA will remain in effect (meaning this test can be used) for the duration of the COVID-19 declaration under Section 564(b)(1) of the Act, 21 U.S.C. section 360bbb-3(b)(1), unless the authorization is terminated or revoked.  Performed at [facility], [address] [place], [place] [address]   Digoxin level     Status: Abnormal   Collection Time: 04/18/23  1:25 PM  Result Value Ref Range   Digoxin Level <0.2 (L) 0.8 - 2.0 ng/mL    Comment: RESULT CONFIRMED BY MANUAL DILUTION Performed at [facility], [address] [place], [place] [address]   Troponin I (High Sensitivity)     Status: Abnormal   Collection Time: 04/18/23  4:24 PM  Result Value Ref Range   Troponin I (High Sensitivity) 53 (H) <18 ng/L    Comment: (NOTE) Elevated high sensitivity troponin I (hsTnI) values and significant  changes across serial measurements may suggest ACS but many other  chronic and acute conditions are known to elevate hsTnI results.  Refer to the "Links" section for chest pain algorithms and additional  guidance. Performed at [facility], [address] [place], [place] [address]   CBC     Status: Abnormal   Collection Time: 04/18/23  4:24 PM  Result Value Ref Range   WBC 4.3 4.0 - 10.5 K/uL   RBC 3.85 (L) 4.22 - 5.81 MIL/uL   Hemoglobin 12.5 (L) 13.0 - 17.0 g/dL   HCT 21.3 (L) 08.6 - 57.8 %   MCV 99.0 80.0 - 100.0 fL   MCH 32.5 26.0 - 34.0 pg   MCHC 32.8 30.0 - 36.0 g/dL   RDW 46.9 62.9 - 52.8 %   Platelets 199 150 - 400 K/uL   nRBC 0.0 0.0 - 0.2 %    Comment: Performed at [facility], [address], [place], [place] [address]  Creatinine, serum     Status: None   Collection Time: 04/18/23  4:24 PM  Result Value Ref Range   Creatinine, Ser 1.13 0.61 - 1.24 mg/dL   GFR, Estimated >40 >10 mL/min    Comment: (NOTE) Calculated using the CKD-EPI Creatinine Equation (2021) Performed at [facility],  [address] [place], [place] [address]    Magnesium     Status: None   Collection Time: 04/18/23  4:24 PM  Result Value Ref Range   Magnesium 2.0 1.7 - 2.4 mg/dL    Comment: Performed at [facility], [address] [place], [place] [address]  Blood gas, venous     Status: Abnormal   Collection Time: 04/18/23  6:26 PM  Result Value Ref Range   pH, Ven 7.49 (H) 7.25 - 7.43   pCO2, Ven 33 (L) 44 - 60 mmHg   pO2, Ven 33 32 - 45 mmHg   Bicarbonate 24.6 20.0 - 28.0 mmol/L   Acid-Base Excess 1.6 0.0 - 2.0 mmol/L   O2 Saturation 59.6 %   Patient temperature 36.7    Collection site VEIN     Comment: Performed at [facility], [address], [place], [place] [address]  Comprehensive metabolic panel     Status: Abnormal   Collection Time: 04/19/23  5:56 AM  Result Value Ref Range   Sodium 135 135 - 145 mmol/L   Potassium 3.4 (L) 3.5 - 5.1 mmol/L   Chloride 101 98 - 111 mmol/L   CO2 22 22 - 32 mmol/L   Glucose, Bld 84 70 - 99 mg/dL    Comment: Glucose reference range applies only to samples taken after fasting for at least 8 hours.   BUN 16 8 - 23 mg/dL   Creatinine, Ser 9.14 (H) 0.61 - 1.24 mg/dL   Calcium 8.9 8.9 - 78.2 mg/dL   Total Protein 6.8 6.5 - 8.1 g/dL   Albumin 3.1 (L) 3.5 - 5.0 g/dL   AST 36 15 - 41 U/L   ALT 25 0 - 44 U/L   Alkaline Phosphatase 107 38 - 126 U/L   Total Bilirubin 1.2 0.0 - 1.2 mg/dL   GFR, Estimated 57 (L) >60 mL/min    Comment: (NOTE) Calculated using the CKD-EPI Creatinine Equation (2021)    Anion gap 12 5 - 15    Comment: Performed at [facility], [address], [place], [place] [address]  CBC     Status: Abnormal   Collection Time: 04/19/23  5:56 AM  Result Value Ref Range   WBC 5.6 4.0 - 10.5 K/uL   RBC 4.00 (L) 4.22 - 5.81 MIL/uL   Hemoglobin 13.0 13.0 - 17.0 g/dL   HCT 30.8 65.7 - 84.6 %   MCV 98.8 80.0 - 100.0 fL   MCH 32.5 26.0 - 34.0 pg   MCHC 32.9 30.0 - 36.0 g/dL   RDW 96.2 95.2 - 84.1 %   Platelets 208 150 - 400 K/uL   nRBC 0.0 0.0 - 0.2 %     Comment: Performed at [facility], [address] [place], [place] [address]  Troponin I (High Sensitivity)     Status: Abnormal   Collection Time: 04/19/23  5:56 AM  Result Value Ref Range   Troponin I (High Sensitivity) 69 (H) <18 ng/L    Comment: (NOTE) Elevated high sensitivity troponin I (hsTnI) values and significant  changes across serial measurements may suggest ACS but many other  chronic and acute conditions are known to elevate hsTnI results.  Refer to the "Links" section for chest pain algorithms and additional  guidance. Performed at [facility], [address] [place], [place] [address]   Troponin I (High Sensitivity)     Status: Abnormal   Collection Time: 04/19/23  11:41 AM  Result Value Ref Range   Troponin I (High Sensitivity) 60 (H) <18 ng/L    Comment: (NOTE) Elevated high sensitivity troponin I (hsTnI) values and significant  changes across serial measurements may suggest ACS but many other  chronic and acute conditions are known to elevate hsTnI results.  Refer to the "Links" section for chest pain algorithms and additional  guidance. Performed at [facility], [address], [place], [place] [address]   CBC     Status: Abnormal   Collection Time: 04/20/23  2:53 AM  Result Value Ref Range   WBC 5.8 4.0 - 10.5 K/uL   RBC 3.73 (L) 4.22 - 5.81 MIL/uL   Hemoglobin 12.0 (L) 13.0 - 17.0 g/dL   HCT 60.4 (L) 54.0 - 98.1 %   MCV 96.5 80.0 - 100.0 fL   MCH 32.2 26.0 - 34.0 pg   MCHC 33.3 30.0 - 36.0 g/dL   RDW 19.1 47.8 - 29.5 %   Platelets 198 150 - 400 K/uL   nRBC 0.0 0.0 - 0.2 %    Comment: Performed at [facility], [address] [place], [place] [address]  Basic metabolic panel     Status: Abnormal   Collection Time: 04/20/23  2:53 AM  Result Value Ref Range   Sodium 133 (L) 135 - 145 mmol/L   Potassium 3.8 3.5 - 5.1 mmol/L   Chloride 98 98 - 111 mmol/L   CO2 26 22 - 32 mmol/L   Glucose, Bld 103 (H) 70 - 99 mg/dL     Comment: Glucose reference range applies only to samples taken after fasting for at least 8 hours.   BUN 20 8 - 23 mg/dL   Creatinine, Ser 8.65 (H) 0.61 - 1.24 mg/dL   Calcium 8.5 (L) 8.9 - 10.3 mg/dL   GFR, Estimated 58 (L) >60 mL/min    Comment: (NOTE) Calculated using the CKD-EPI Creatinine Equation (2021)    Anion gap 9 5 - 15    Comment: Performed at [facility], [address] [place], [place] [address]  Magnesium     Status: None   Collection Time: 04/20/23  2:53 AM  Result Value Ref Range   Magnesium 1.9 1.7 - 2.4 mg/dL    Comment: Performed at [facility], [address], [place], [place] [address]  Lactic acid, plasma     Status: None   Collection Time: 04/20/23 12:15 PM  Result Value Ref Range   Lactic Acid, Venous 1.7 0.5 - 1.9 mmol/L    Comment: Performed at [facility], [address], [place], [place] [address]  Basic metabolic panel     Status: Abnormal   Collection Time: 04/21/23  2:50 AM  Result Value Ref Range   Sodium 132 (L) 135 - 145 mmol/L   Potassium 4.1 3.5 - 5.1 mmol/L   Chloride 99 98 - 111 mmol/L   CO2 24 22 - 32 mmol/L   Glucose, Bld 88 70 - 99 mg/dL    Comment: Glucose reference range applies only to samples taken after fasting for at least 8 hours.   BUN 23 8 - 23 mg/dL   Creatinine, Ser 4.40 0.61 - 1.24 mg/dL   Calcium 8.6 (L) 8.9 - 10.3 mg/dL   GFR, Estimated >10 >27 mL/min    Comment: (NOTE) Calculated using the CKD-EPI Creatinine Equation (2021)    Anion gap 9 5 - 15    Comment: Performed at  [facility] Lab, [address] [place], [place] [address]  Magnesium     Status: None   Collection Time: 04/21/23  2:50 AM  Result Value Ref Range   Magnesium 2.2 1.7 - 2.4 mg/dL    Comment: Performed at [facility], [address] [place], [place] [address]  Basic metabolic panel     Status: Abnormal   Collection Time: 04/22/23  2:37 AM  Result Value Ref Range   Sodium 133 (L) 135 - 145 mmol/L   Potassium 3.6 3.5  - 5.1 mmol/L   Chloride 96 (L) 98 - 111 mmol/L   CO2 25 22 - 32 mmol/L   Glucose, Bld 99 70 - 99 mg/dL    Comment: Glucose reference range applies only to samples taken after fasting for at least 8 hours.   BUN 25 (H) 8 - 23 mg/dL   Creatinine, Ser 8.65 (H) 0.61 - 1.24 mg/dL   Calcium 8.9 8.9 - 78.4 mg/dL   GFR, Estimated 54 (L) >60 mL/min    Comment: (NOTE) Calculated using the CKD-EPI Creatinine Equation (2021)    Anion gap 12 5 - 15    Comment: Performed at [facility], [address] [place], [place] [address]  Magnesium     Status: None   Collection Time: 04/22/23  2:37 AM  Result Value Ref Range   Magnesium 1.9 1.7 - 2.4 mg/dL    Comment: Performed at [facility], [address], [place], [place] [address]  Basic metabolic panel     Status: Abnormal   Collection Time: 04/23/23  2:25 AM  Result Value Ref Range   Sodium 135 135 - 145 mmol/L   Potassium 4.1 3.5 - 5.1 mmol/L   Chloride 98 98 - 111 mmol/L   CO2 26 22 - 32 mmol/L   Glucose, Bld 121 (H) 70 - 99 mg/dL    Comment: Glucose reference range applies only to samples taken after fasting for at least 8 hours.   BUN 29 (H) 8 - 23 mg/dL   Creatinine, Ser 3.24 (H) 0.61 - 1.24 mg/dL   Calcium 9.1 8.9 - 40.1 mg/dL   GFR, Estimated 55 (L) >60 mL/min    Comment: (NOTE) Calculated using the CKD-EPI Creatinine Equation (2021)    Anion gap 11 5 - 15    Comment: Performed at [facility], [address] [place], [place] [address]  Magnesium     Status: None   Collection Time: 04/23/23  2:25 AM  Result Value Ref Range   Magnesium 2.3 1.7 - 2.4 mg/dL    Comment: Performed at [facility] Lab, [address] [place], [place] [address]  Basic metabolic panel     Status: Abnormal   Collection Time: 04/24/23  3:08 AM  Result Value Ref Range   Sodium 133 (L) 135 - 145 mmol/L   Potassium 4.6 3.5 - 5.1 mmol/L   Chloride 98 98 - 111 mmol/L   CO2 28 22 - 32 mmol/L   Glucose, Bld 89 70 - 99 mg/dL    Comment:  Glucose reference range applies only to samples taken after fasting for at least 8 hours.   BUN 24 (H) 8 - 23 mg/dL   Creatinine, Ser 0.34 (H) 0.61 - 1.24 mg/dL   Calcium 8.9 8.9 - 74.2 mg/dL   GFR, Estimated 59 (L) >60 mL/min    Comment: (NOTE) Calculated using the CKD-EPI Creatinine Equation (2021)  Anion gap 7 5 - 15    Comment: Performed at [facility] Lab, [address] [place], [place] [address]  Magnesium     Status: None   Collection Time: 04/24/23  3:08 AM  Result Value Ref Range   Magnesium 2.1 1.7 - 2.4 mg/dL    Comment: Performed at [facility], [address], [place], [place] [address]  Basic metabolic panel     Status: Abnormal   Collection Time: 04/25/23  2:42 AM  Result Value Ref Range   Sodium 133 (L) 135 - 145 mmol/L   Potassium 4.1 3.5 - 5.1 mmol/L   Chloride 96 (L) 98 - 111 mmol/L   CO2 27 22 - 32 mmol/L   Glucose, Bld 125 (H) 70 - 99 mg/dL    Comment: Glucose reference range applies only to samples taken after fasting for at least 8 hours.   BUN 27 (H) 8 - 23 mg/dL   Creatinine, Ser 4.40 (H) 0.61 - 1.24 mg/dL   Calcium 9.1 8.9 - 10.2 mg/dL   GFR, Estimated >72 >53 mL/min    Comment: (NOTE) Calculated using the CKD-EPI Creatinine Equation (2021)    Anion gap 10 5 - 15    Comment: Performed at [facility], [address] [place], [place] [address]  Magnesium     Status: None   Collection Time: 04/25/23  2:42 AM  Result Value Ref Range   Magnesium 2.1 1.7 - 2.4 mg/dL    Comment: Performed at [facility], [address] [place], [place] [address]       Assessment & Plan:   Problem List Items Addressed This Visit       Respiratory   COPD (chronic obstructive pulmonary disease) (HCC)   Patient stable.  Well controlled with current therapy.   Continue current meds.          Nervous and Auditory   Neuropathy   Sending refills of gabapentin for pt.  Will reassess at follow up.        Other   Anxiety disorder -  Primary (Chronic)   Patient stable.  Well controlled with current therapy.   Continue current meds.           Return in about 2 months (around 04/22/2023) for F/U.   Total time spent: 20 minutes  Miki Kins, FNP  02/19/2023   This document may have been prepared by [facility] Voice Recognition software and as such may include unintentional dictation errors.

## 2023-05-19 NOTE — Assessment & Plan Note (Signed)
 Patient stable.  Well controlled with current therapy.   Continue current meds.

## 2023-05-19 NOTE — Assessment & Plan Note (Signed)
 Sending refills of gabapentin for pt.  Will reassess at follow up.

## 2023-05-21 ENCOUNTER — Other Ambulatory Visit: Payer: Self-pay | Admitting: Cardiology

## 2023-05-23 ENCOUNTER — Other Ambulatory Visit (HOSPITAL_COMMUNITY): Payer: Self-pay

## 2023-05-23 ENCOUNTER — Telehealth (HOSPITAL_COMMUNITY): Payer: Self-pay | Admitting: Cardiology

## 2023-05-23 ENCOUNTER — Telehealth (HOSPITAL_COMMUNITY): Payer: Self-pay | Admitting: Licensed Clinical Social Worker

## 2023-05-23 ENCOUNTER — Telehealth (HOSPITAL_COMMUNITY): Payer: Self-pay

## 2023-05-23 DIAGNOSIS — I739 Peripheral vascular disease, unspecified: Secondary | ICD-10-CM

## 2023-05-23 MED ORDER — FUROSEMIDE 20 MG PO TABS
40.0000 mg | ORAL_TABLET | Freq: Every day | ORAL | 11 refills | Status: DC
Start: 2023-05-23 — End: 2023-10-11

## 2023-05-23 NOTE — Telephone Encounter (Signed)
-  LASIX RX SENT -ABI ORDER PLACED AND PER P.BRANCH,CMA - NPCR -KAMILAH ASSISTING WITH ADDING PATIENT BEFORE/AFTER APPT 3/20   -HH ORDER TO BE PLACED DURING OV

## 2023-05-23 NOTE — Telephone Encounter (Signed)
 Called to confirm/remind patient of their appointment at [facility] on 05/24/2023 10:30.   Appointment:   [x] Confirmed  [] Left mess   [] No answer/No voice mail  [] Phone not in service  Patient reminded to bring all medications and/or complete list.  Confirmed patient has transportation. Gave directions, instructed to utilize valet parking.

## 2023-05-23 NOTE — Telephone Encounter (Signed)
 Called to confirm/remind patient of their appointment at the [facility] on 05/24/2023.   Appointment:   [] Confirmed  [] Left mess   [] No answer/No voice mail  [] Phone not in service  Patient reminded to bring all medications and/or complete list.  Confirmed patient has transportation. Gave directions, instructed to utilize valet parking.

## 2023-05-23 NOTE — Telephone Encounter (Addendum)
 H&V Care Navigation CSW Progress Note  Clinical Social Worker consulted to assist pt in getting to sick visit with clinic tomorrow- unable to set up transportation through insurance due to short notice of appt/pt not safely able to drive right now due to medical concerns.  Ride arranged through [employer]- pick up from home 9:30am   SDOH Screenings   Food Insecurity: Food Insecurity Present (05/10/2023)  Housing: Low Risk  (05/10/2023)  Recent Concern: Housing - High Risk (04/19/2023)  Transportation Needs: Unmet Transportation Needs (05/23/2023)  Utilities: Not At Risk (05/10/2023)  Financial Resource Strain: High Risk (04/12/2022)  Physical Activity: Inactive (02/05/2017)  Social Connections: Unknown (04/19/2023)  Stress: Stress Concern Present (02/05/2017)  Tobacco Use: Medium Risk (05/02/2023)   05/23/2023  Bruce [name] DOB: 02-11-1946 MRN: [id number]   RIDER WAIVER AND RELEASE OF LIABILITY  For the purposes of helping with transportation needs, [facility] partners with outside transportation providers (taxi companies, [place], Catering manager.) to give [employer] patients or other approved people the choice of on-demand rides [employer]") to our buildings for non-emergency visits.  By using [employer], I, the person signing this document, on behalf of myself and/or any legal minors (in my care using the [employer]), agree:  Science writer given to me are supplied by independent, outside transportation providers who do not work for, or have any affiliation with, [employer]. [place] is not a transportation company. [facility] has no control over the quality or safety of the rides I get using [employer]. [facility] has no control over whether any outside ride will happen on time or not. [facility] gives no guarantee on the reliability, quality, safety, or availability on any rides, or that no mistakes will happen. I know and accept that traveling by vehicle  (car, truck, SVU, Zenaida Niece, bus, taxi, etc.) has risks of serious injuries such as disability, being paralyzed, and death. I know and agree the risk of using [employer] is mine alone, and not [employer]. Transport Services are provided "as is" and as are available. The transportation providers are in charge for all inspections and care of the vehicles used to provide these rides. I agree not to take legal action against [facility], its agents, employees, officers, directors, representatives, insurers, attorneys, assigns, successors, subsidiaries, and affiliates at any time for any reasons related directly or indirectly to using [employer]. I also agree not to take legal action against [facility] or its affiliates for any injury, death, or damage to property caused by or related to using [employer]. I have read this Waiver and Release of Liability, and I understand the terms used in it and their legal meaning. This Waiver is freely and voluntarily given with the understanding that my right (or any legal minors) to legal action against [facility] relating to [employer] is knowingly given up to use these services.   I attest that I read the Ride Waiver and Release of Liability to Bruce [name], gave Mr. Hammers the opportunity to ask questions and answered the questions asked (if any). I affirm that Bruce [name] then provided consent for assistance with transportation.     Burna Sis

## 2023-05-23 NOTE — Addendum Note (Signed)
 Addended by: Theresia Bough on: 05/23/2023 03:50 PM   Modules accepted: Orders

## 2023-05-23 NOTE — Progress Notes (Signed)
 Paramedicine Encounter    Patient ID: Bruce [name], male    DOB: Mar 09, 1945, 78 y.o.   MRN: [id number]   Complaints- short of breath, fatigued, weakness, lower leg edema and hip pain   Assessment- CAOX4, warm and dry seated upright in bed short of breath on 3LPM oxygen via nasal cannula. He reports feeling tired, "run down" with shortness of breath. He has not been using his inhalers or nebulizer treatments in the last week. Lungs present with rhonchi. He is coughing up clear mucus. Lower legs +3 BLE noted. Painful on palpation. Vitals as noted. Weight down 4 lbs from last week. (159--->155)   Compliance with meds- pill box empty- at beginning of visit he admits he has taken all of his meds. However after assessment and more discussion he admits to throwing his pills away not taking them. He says he probably misses over 4 doses weekly.   Pill box filled- for one week   Refills needed- none   Meds changes since last visit- none     Social changes- none    VISIT SUMMARY- Arrived for home visit for [facility] where he was laying upright in the bed short of breath on his nasal cannula at 3LPM. He reports feeling run down, fatigued and short of breath with lower leg swelling and hip pain. He has not left his home for two weeks. Has failed to be compliant in scheduling appointments, retrieving meds from pharmacy and has been non compliant with taking his medications as mentioned above. Vitals obtained as noted. +3 BLE noted, lungs present with Rhonchi. I contacted HF clinic triage and they we're able to get him in for a visit tomorrow at 1030. I will plan to attend with him. He is not currently on a diuretic- I explained same to HF triage. I will follow up if any meds are added today. Visit complete.   BP 120/64   Pulse 69   Resp 16   Wt 155 lb 3.2 oz (70.4 kg)   SpO2 (!) 82% Comment: 3LPM  BMI 20.48 kg/m  Weight yesterday-- didn't weigh  Last visit weight--159lbs      ACTION: Home visit  completed     Patient Care Team: Miki Kins, FNP as PCP - General (Family Medicine) Laurier Nancy, MD as PCP - Cardiology (Cardiology) Redge Gainer, RN as [facility] Care Management  Patient Active Problem List   Diagnosis Date Noted   Neuropathy 05/19/2023   Acute on chronic respiratory failure with hypoxia (HCC) 04/19/2023   Acute decompensated heart failure (HCC) 04/18/2023   Prolonged Q-T interval on ECG 04/18/2023   Influenza A 04/18/2023   Cor pulmonale (chronic) (HCC) 04/18/2023   Intermittent claudication (HCC) 12/18/2022   B12 deficiency 12/18/2022   MDD (major depressive disorder), recurrent episode, moderate (HCC) 09/01/2022   Pulmonary HTN, 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   Acute on chronic systolic congestive heart failure (HCC) 04/13/2022   Pulmonary embolism (HCC) 04/09/2022   PAF (paroxysmal atrial fibrillation) (HCC)    NSTEMI (non-ST elevated myocardial infarction) (HCC) 10/10/2018   Syncope 04/28/2018   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   Limb ischemia 11/18/2013   Acute blood loss anemia 10/08/2013   Anxiety disorder 10/08/2013   Dyslipidemia 10/08/2013   GERD (gastroesophageal reflux disease) 10/08/2013   Anticoagulated 10/08/2013   Hypertension    CAD (coronary artery disease) 08/16/2010   COPD (chronic obstructive pulmonary disease) (HCC) 08/16/2010   DVT of leg (deep venous thrombosis) (HCC) 08/16/2010    Current Outpatient Medications:  acetaminophen (TYLENOL) 500 MG tablet, Take 2 tablets (1,000 mg total) by mouth every 6 (six) hours as needed for mild pain (pain score 1-3), moderate pain (pain score 4-6), fever or headache., Disp: 30 tablet, Rfl: 0   albuterol (VENTOLIN HFA) 108 (90 Base) MCG/ACT inhaler, TAKE TWO PUFFS BY MOUTH AS NEEDED FOR WHEEZING EVERY 4-6 HOURS, Disp: 8.5 g, Rfl: 5   ALPRAZolam (XANAX) 0.5 MG tablet, Take 1 tablet (0.5 mg total) by mouth 2 (two) times daily as needed for anxiety., Disp: 60 tablet, Rfl: 1   amiodarone (PACERONE)  200 MG tablet, Take 0.5 tablets (100 mg total) by mouth daily. (Patient taking differently: Take 100 mg by mouth in the morning.), Disp: 15 tablet, Rfl: 11   apixaban (ELIQUIS) 5 MG TABS tablet, TAKE ONE TABLET (5 MG TOTAL) BY MOUTH TWO (TWO) TIMES DAILY. (Patient taking differently: Take 5 mg by mouth 2 (two) times daily.), Disp: 60 tablet, Rfl: 11   budesonide (PULMICORT) 0.25 MG/2ML nebulizer solution, Take 2 mLs (0.25 mg total) by nebulization 2 (two) times daily., Disp: 60 mL, Rfl: 12   cetirizine (ZYRTEC) 10 MG tablet, Take 1 tablet (10 mg total) by mouth daily. (Patient taking differently: Take 10 mg by mouth in the morning.), Disp: 30 tablet, Rfl: 11   cholecalciferol (VITAMIN D3) 25 MCG (1000 UNIT) tablet, Take 1,000 Units by mouth in the morning., Disp: , Rfl:    digoxin (LANOXIN) 0.125 MG tablet, TAKE ONE TABLET (0.125 MG TOTAL) BY MOUTH DAILY. (Patient taking differently: Take 0.125 mg by mouth in the morning.), Disp: 30 tablet, Rfl: 6   Elastic Bandages & Supports (MEDICAL COMPRESSION STOCKINGS) MISC, Wear daily as needed, Disp: 2 each, Rfl: 0   FARXIGA 10 MG TABS tablet, TAKE ONE TABLET (10 MG TOTAL) BY MOUTH DAILY. (Patient taking differently: Take 10 mg by mouth in the morning.), Disp: 90 tablet, Rfl: 1   fluticasone (FLONASE) 50 MCG/ACT nasal spray, Place 1 spray into both nostrils daily as needed for allergies., Disp: , Rfl:    gabapentin (NEURONTIN) 100 MG capsule, TAKE ONE CAPSULE (100 MG TOTAL) BY MOUTH THREE (THREE) TIMES DAILY., Disp: 90 capsule, Rfl: 1   lidocaine (LIDODERM) 5 %, Place 1 patch onto the skin daily. Remove & Discard patch within 12 hours or as directed by MD, Disp: 30 patch, Rfl: 11   Multiple Vitamin (MULTIVITAMIN) tablet, Take 1 tablet by mouth in the morning. For Men, Disp: , Rfl:    nitroGLYCERIN (NITROSTAT) 0.4 MG SL tablet, Place 0.4 mg under the tongue every 5 (five) minutes as needed for chest pain., Disp: , Rfl:    Omega-3 Fatty Acids (FISH OIL PO), Take  1,000 mg by mouth every evening., Disp: , Rfl:    omeprazole (PRILOSEC) 40 MG capsule, Take 1 capsule (40 mg total) by mouth 2 (two) times daily., Disp: 180 capsule, Rfl: 1   pantoprazole (PROTONIX) 40 MG tablet, Take 1 tablet (40 mg total) by mouth daily. (Patient taking differently: Take 40 mg by mouth in the morning.), Disp: 90 tablet, Rfl: 3   ranolazine (RANEXA) 500 MG 12 hr tablet, TAKE ONE TABLET (500 MG TOTAL) BY MOUTH TWO (TWO) TIMES DAILY. (Patient taking differently: Take 500 mg by mouth 2 (two) times daily.), Disp: 60 tablet, Rfl: 6   rOPINIRole (REQUIP) 1 MG tablet, TAKE ONE TABLET (1 MG TOTAL) BY MOUTH IN THE MORNING AND AT BEDTIME. (Patient taking differently: Take 1 mg by mouth 2 (two) times daily.), Disp: 60 tablet, Rfl:  3   rosuvastatin (CRESTOR) 20 MG tablet, Take 1 tablet (20 mg total) by mouth daily., Disp: 30 tablet, Rfl: 2   sildenafil (REVATIO) 20 MG tablet, Take 1 tablet (20 mg total) by mouth 3 (three) times daily., Disp: 90 tablet, Rfl: 5   Tiotropium Bromide-Olodaterol (STIOLTO RESPIMAT) 2.5-2.5 MCG/ACT AERS, Inhale 2 puffs into the lungs daily., Disp: 4 g, Rfl: 12 No current facility-administered medications for this visit.  Facility-Administered Medications Ordered in Other Visits:    albuterol (PROVENTIL) (2.5 MG/3ML) 0.083% nebulizer solution 2.5 mg, 2.5 mg, Nebulization, Once, Raechel Chute, MD Allergies  Allergen Reactions   Adhesive [Tape] Other (See Comments)    After right leg fracture surgery, pt developed a large blister where tape was applied to his right leg. OK to use paper tape.   Eggs-Apples-Oats [Alitraq] Other (See Comments)    Caused chest pains   Imdur [Isosorbide Dinitrate] Other (See Comments)    hallucinations   Singulair [Montelukast Sodium] Other (See Comments)    Hallucinations      Social History   Socioeconomic History   Marital status: Single    Spouse name: Not on file   Number of children: 2   Years of education: Not on file    Highest education level: 6th grade  Occupational History   Not on file  Tobacco Use   Smoking status: Former    Current packs/day: 0.00    Average packs/day: 0.5 packs/day for 52.0 years (26.0 ttl pk-yrs)    Types: Cigarettes    Start date: 07/05/1970    Quit date: 07/05/2022    Years since quitting: 0.8   Smokeless tobacco: Never  Vaping Use   Vaping status: Never Used  Substance and Sexual Activity   Alcohol use: No    Alcohol/week: 0.0 standard drinks of alcohol    Comment: Stopped 2009   Drug use: No   Sexual activity: Not Currently  Other Topics Concern   Not on file  Social History Narrative   ** Merged History Encounter **       Social Drivers of Health   Financial Resource Strain: High Risk (04/12/2022)   Overall Financial Resource Strain (CARDIA)    Difficulty of Paying Living Expenses: Hard  Food Insecurity: Food Insecurity Present (05/10/2023)   Hunger Vital Sign    Worried About Running Out of Food in the Last Year: Sometimes true    Ran Out of Food in the Last Year: Often true  Transportation Needs: No Transportation Needs (05/10/2023)   PRAPARE - Administrator, Civil Service (Medical): No    Lack of Transportation (Non-Medical): No  Physical Activity: Inactive (02/05/2017)   Exercise Vital Sign    Days of Exercise per Week: 0 days    Minutes of Exercise per Session: 0 min  Stress: Stress Concern Present (02/05/2017)   [employer] of Occupational Health - Occupational Stress Questionnaire    Feeling of Stress : Rather much  Social Connections: Unknown (04/19/2023)   Social Connection and Isolation Panel [NHANES]    Frequency of Communication with Friends and Family: Twice a week    Frequency of Social Gatherings with Friends and Family: Once a week    Attends Religious Services: Patient declined    Database administrator or Organizations: No    Attends Banker Meetings: Never    Marital Status: Patient declined  Intimate Partner  Violence: Not At Risk (05/10/2023)   Humiliation, Afraid, Rape, and Kick questionnaire  Fear of Current or Ex-Partner: No    Emotionally Abused: No    Physically Abused: No    Sexually Abused: No    Physical Exam      Future Appointments  Date Time Provider Department Center  05/24/2023 10:30 AM [facility] PA/NP [facility] None  05/24/2023  1:15 PM Homsher, Wynona Canes, RN [facility] None  05/30/2023  9:00 AM [facility] PA/NP [facility] None  06/01/2023 10:15 AM Miki Kins, FNP [facility] None

## 2023-05-23 NOTE — Telephone Encounter (Signed)
 Heather with para medicine called during patients home visit to report   3-5 + BLE into upper knee Weight is down @   155 B/P 120/64 82% on 3L rhonchi all around HR 69 regular Not taking a diuretic as its not prescribed  Med compliance questionable, pill box is empty however pt reports he misses doses and will toss the pills .  Add on 3/20 @ 1030 -will forward to triage provider to review in the event diuretic/med adjustment is needed overnight  -LCSW to assist with transportation

## 2023-05-23 NOTE — Telephone Encounter (Signed)
 Daeshawn updated and made aware. I advised him to be sure to pack a bag for possible admission tomorrow- he understands. I will follow up when RX gets sent for Lasix to educate on directions and assist in getting same from pharmacy if he cannot.   [name], EMT-Paramedic [phone] 05/23/2023

## 2023-05-24 ENCOUNTER — Other Ambulatory Visit: Payer: Self-pay

## 2023-05-24 ENCOUNTER — Ambulatory Visit (HOSPITAL_BASED_OUTPATIENT_CLINIC_OR_DEPARTMENT_OTHER)
Admission: RE | Admit: 2023-05-24 | Discharge: 2023-05-24 | Disposition: A | Discharge status: Home / Self Care | Source: Ambulatory Visit | Attending: Physician Assistant | Admitting: Physician Assistant

## 2023-05-24 ENCOUNTER — Emergency Department (HOSPITAL_COMMUNITY)

## 2023-05-24 ENCOUNTER — Other Ambulatory Visit (HOSPITAL_COMMUNITY): Payer: Self-pay

## 2023-05-24 ENCOUNTER — Encounter (HOSPITAL_COMMUNITY): Payer: Self-pay

## 2023-05-24 ENCOUNTER — Ambulatory Visit (HOSPITAL_COMMUNITY)
Admission: RE | Admit: 2023-05-24 | Discharge: 2023-05-24 | Discharge status: Home / Self Care | Source: Ambulatory Visit | Attending: Family Medicine | Admitting: Family Medicine

## 2023-05-24 ENCOUNTER — Inpatient Hospital Stay (HOSPITAL_COMMUNITY)
Admission: EM | Admit: 2023-05-24 | Discharge: 2023-06-06 | DRG: 175 | Disposition: A | Discharge status: Home / Self Care | Attending: Internal Medicine | Admitting: Internal Medicine

## 2023-05-24 VITALS — BP 132/72 | HR 100 | Ht 72.0 in | Wt 160.6 lb

## 2023-05-24 DIAGNOSIS — Z79899 Other long term (current) drug therapy: Secondary | ICD-10-CM

## 2023-05-24 DIAGNOSIS — I2724 Chronic thromboembolic pulmonary hypertension: Secondary | ICD-10-CM

## 2023-05-24 DIAGNOSIS — J440 Chronic obstructive pulmonary disease with acute lower respiratory infection: Secondary | ICD-10-CM | POA: Diagnosis present

## 2023-05-24 DIAGNOSIS — Z515 Encounter for palliative care: Secondary | ICD-10-CM | POA: Diagnosis not present

## 2023-05-24 DIAGNOSIS — Z7189 Other specified counseling: Secondary | ICD-10-CM | POA: Diagnosis not present

## 2023-05-24 DIAGNOSIS — R296 Repeated falls: Secondary | ICD-10-CM | POA: Diagnosis present

## 2023-05-24 DIAGNOSIS — J154 Pneumonia due to other streptococci: Secondary | ICD-10-CM | POA: Diagnosis present

## 2023-05-24 DIAGNOSIS — D638 Anemia in other chronic diseases classified elsewhere: Secondary | ICD-10-CM | POA: Diagnosis present

## 2023-05-24 DIAGNOSIS — Z9981 Dependence on supplemental oxygen: Secondary | ICD-10-CM

## 2023-05-24 DIAGNOSIS — Z86711 Personal history of pulmonary embolism: Secondary | ICD-10-CM | POA: Insufficient documentation

## 2023-05-24 DIAGNOSIS — F17210 Nicotine dependence, cigarettes, uncomplicated: Secondary | ICD-10-CM | POA: Insufficient documentation

## 2023-05-24 DIAGNOSIS — I480 Paroxysmal atrial fibrillation: Secondary | ICD-10-CM | POA: Insufficient documentation

## 2023-05-24 DIAGNOSIS — I2510 Atherosclerotic heart disease of native coronary artery without angina pectoris: Secondary | ICD-10-CM | POA: Insufficient documentation

## 2023-05-24 DIAGNOSIS — Z716 Tobacco abuse counseling: Secondary | ICD-10-CM

## 2023-05-24 DIAGNOSIS — Z91199 Patient's noncompliance with other medical treatment and regimen due to unspecified reason: Secondary | ICD-10-CM

## 2023-05-24 DIAGNOSIS — J479 Bronchiectasis, uncomplicated: Secondary | ICD-10-CM | POA: Diagnosis not present

## 2023-05-24 DIAGNOSIS — J449 Chronic obstructive pulmonary disease, unspecified: Secondary | ICD-10-CM | POA: Insufficient documentation

## 2023-05-24 DIAGNOSIS — E871 Hypo-osmolality and hyponatremia: Secondary | ICD-10-CM | POA: Diagnosis not present

## 2023-05-24 DIAGNOSIS — Z7951 Long term (current) use of inhaled steroids: Secondary | ICD-10-CM

## 2023-05-24 DIAGNOSIS — Z8546 Personal history of malignant neoplasm of prostate: Secondary | ICD-10-CM | POA: Insufficient documentation

## 2023-05-24 DIAGNOSIS — Z7901 Long term (current) use of anticoagulants: Secondary | ICD-10-CM | POA: Insufficient documentation

## 2023-05-24 DIAGNOSIS — I110 Hypertensive heart disease with heart failure: Secondary | ICD-10-CM | POA: Diagnosis present

## 2023-05-24 DIAGNOSIS — E876 Hypokalemia: Secondary | ICD-10-CM | POA: Diagnosis not present

## 2023-05-24 DIAGNOSIS — J9621 Acute and chronic respiratory failure with hypoxia: Secondary | ICD-10-CM | POA: Insufficient documentation

## 2023-05-24 DIAGNOSIS — T460X6A Underdosing of cardiac-stimulant glycosides and drugs of similar action, initial encounter: Secondary | ICD-10-CM | POA: Diagnosis present

## 2023-05-24 DIAGNOSIS — I5043 Acute on chronic combined systolic (congestive) and diastolic (congestive) heart failure: Secondary | ICD-10-CM | POA: Diagnosis present

## 2023-05-24 DIAGNOSIS — Z5982 Transportation insecurity: Secondary | ICD-10-CM

## 2023-05-24 DIAGNOSIS — R0609 Other forms of dyspnea: Secondary | ICD-10-CM | POA: Diagnosis not present

## 2023-05-24 DIAGNOSIS — Z85038 Personal history of other malignant neoplasm of large intestine: Secondary | ICD-10-CM | POA: Insufficient documentation

## 2023-05-24 DIAGNOSIS — R0902 Hypoxemia: Secondary | ICD-10-CM | POA: Diagnosis not present

## 2023-05-24 DIAGNOSIS — Z66 Do not resuscitate: Secondary | ICD-10-CM | POA: Diagnosis present

## 2023-05-24 DIAGNOSIS — I509 Heart failure, unspecified: Secondary | ICD-10-CM

## 2023-05-24 DIAGNOSIS — J869 Pyothorax without fistula: Secondary | ICD-10-CM | POA: Diagnosis present

## 2023-05-24 DIAGNOSIS — I739 Peripheral vascular disease, unspecified: Secondary | ICD-10-CM | POA: Insufficient documentation

## 2023-05-24 DIAGNOSIS — R0602 Shortness of breath: Secondary | ICD-10-CM | POA: Diagnosis not present

## 2023-05-24 DIAGNOSIS — M25571 Pain in right ankle and joints of right foot: Secondary | ICD-10-CM | POA: Diagnosis not present

## 2023-05-24 DIAGNOSIS — J441 Chronic obstructive pulmonary disease with (acute) exacerbation: Secondary | ICD-10-CM | POA: Diagnosis present

## 2023-05-24 DIAGNOSIS — I2721 Secondary pulmonary arterial hypertension: Secondary | ICD-10-CM | POA: Diagnosis present

## 2023-05-24 DIAGNOSIS — I5023 Acute on chronic systolic (congestive) heart failure: Secondary | ICD-10-CM | POA: Insufficient documentation

## 2023-05-24 DIAGNOSIS — E875 Hyperkalemia: Secondary | ICD-10-CM | POA: Diagnosis present

## 2023-05-24 DIAGNOSIS — R609 Edema, unspecified: Secondary | ICD-10-CM | POA: Diagnosis not present

## 2023-05-24 DIAGNOSIS — J439 Emphysema, unspecified: Secondary | ICD-10-CM | POA: Diagnosis present

## 2023-05-24 DIAGNOSIS — J9811 Atelectasis: Secondary | ICD-10-CM | POA: Diagnosis present

## 2023-05-24 DIAGNOSIS — Z634 Disappearance and death of family member: Secondary | ICD-10-CM

## 2023-05-24 DIAGNOSIS — R7989 Other specified abnormal findings of blood chemistry: Secondary | ICD-10-CM

## 2023-05-24 DIAGNOSIS — Z658 Other specified problems related to psychosocial circumstances: Secondary | ICD-10-CM

## 2023-05-24 DIAGNOSIS — Z681 Body mass index (BMI) 19 or less, adult: Secondary | ICD-10-CM | POA: Diagnosis not present

## 2023-05-24 DIAGNOSIS — R918 Other nonspecific abnormal finding of lung field: Secondary | ICD-10-CM | POA: Diagnosis not present

## 2023-05-24 DIAGNOSIS — I2609 Other pulmonary embolism with acute cor pulmonale: Secondary | ICD-10-CM | POA: Diagnosis not present

## 2023-05-24 DIAGNOSIS — R0789 Other chest pain: Secondary | ICD-10-CM | POA: Insufficient documentation

## 2023-05-24 DIAGNOSIS — J918 Pleural effusion in other conditions classified elsewhere: Secondary | ICD-10-CM | POA: Diagnosis present

## 2023-05-24 DIAGNOSIS — J90 Pleural effusion, not elsewhere classified: Secondary | ICD-10-CM | POA: Diagnosis present

## 2023-05-24 DIAGNOSIS — Z87442 Personal history of urinary calculi: Secondary | ICD-10-CM

## 2023-05-24 DIAGNOSIS — I82502 Chronic embolism and thrombosis of unspecified deep veins of left lower extremity: Secondary | ICD-10-CM | POA: Diagnosis present

## 2023-05-24 DIAGNOSIS — I272 Pulmonary hypertension, unspecified: Secondary | ICD-10-CM | POA: Insufficient documentation

## 2023-05-24 DIAGNOSIS — I825Y3 Chronic embolism and thrombosis of unspecified deep veins of proximal lower extremity, bilateral: Secondary | ICD-10-CM | POA: Diagnosis not present

## 2023-05-24 DIAGNOSIS — Z85118 Personal history of other malignant neoplasm of bronchus and lung: Secondary | ICD-10-CM

## 2023-05-24 DIAGNOSIS — Z87440 Personal history of urinary (tract) infections: Secondary | ICD-10-CM

## 2023-05-24 DIAGNOSIS — J432 Centrilobular emphysema: Secondary | ICD-10-CM | POA: Diagnosis not present

## 2023-05-24 DIAGNOSIS — I5082 Biventricular heart failure: Secondary | ICD-10-CM | POA: Insufficient documentation

## 2023-05-24 DIAGNOSIS — I82403 Acute embolism and thrombosis of unspecified deep veins of lower extremity, bilateral: Secondary | ICD-10-CM | POA: Diagnosis present

## 2023-05-24 DIAGNOSIS — Z8042 Family history of malignant neoplasm of prostate: Secondary | ICD-10-CM

## 2023-05-24 DIAGNOSIS — Z888 Allergy status to other drugs, medicaments and biological substances status: Secondary | ICD-10-CM

## 2023-05-24 DIAGNOSIS — I428 Other cardiomyopathies: Secondary | ICD-10-CM | POA: Insufficient documentation

## 2023-05-24 DIAGNOSIS — I2699 Other pulmonary embolism without acute cor pulmonale: Principal | ICD-10-CM

## 2023-05-24 DIAGNOSIS — Z8249 Family history of ischemic heart disease and other diseases of the circulatory system: Secondary | ICD-10-CM

## 2023-05-24 DIAGNOSIS — Z86718 Personal history of other venous thrombosis and embolism: Secondary | ICD-10-CM | POA: Insufficient documentation

## 2023-05-24 DIAGNOSIS — J9620 Acute and chronic respiratory failure, unspecified whether with hypoxia or hypercapnia: Secondary | ICD-10-CM | POA: Diagnosis not present

## 2023-05-24 DIAGNOSIS — M7989 Other specified soft tissue disorders: Secondary | ICD-10-CM | POA: Diagnosis not present

## 2023-05-24 DIAGNOSIS — Z5986 Financial insecurity: Secondary | ICD-10-CM

## 2023-05-24 DIAGNOSIS — R64 Cachexia: Secondary | ICD-10-CM | POA: Diagnosis present

## 2023-05-24 DIAGNOSIS — Z789 Other specified health status: Secondary | ICD-10-CM

## 2023-05-24 DIAGNOSIS — M549 Dorsalgia, unspecified: Secondary | ICD-10-CM | POA: Diagnosis present

## 2023-05-24 DIAGNOSIS — Z48813 Encounter for surgical aftercare following surgery on the respiratory system: Secondary | ICD-10-CM | POA: Diagnosis not present

## 2023-05-24 DIAGNOSIS — J948 Other specified pleural conditions: Secondary | ICD-10-CM | POA: Diagnosis not present

## 2023-05-24 DIAGNOSIS — Z555 Less than a high school diploma: Secondary | ICD-10-CM

## 2023-05-24 DIAGNOSIS — I2782 Chronic pulmonary embolism: Secondary | ICD-10-CM | POA: Diagnosis present

## 2023-05-24 DIAGNOSIS — Z733 Stress, not elsewhere classified: Secondary | ICD-10-CM

## 2023-05-24 DIAGNOSIS — R54 Age-related physical debility: Secondary | ICD-10-CM | POA: Diagnosis present

## 2023-05-24 DIAGNOSIS — R079 Chest pain, unspecified: Secondary | ICD-10-CM

## 2023-05-24 DIAGNOSIS — K219 Gastro-esophageal reflux disease without esophagitis: Secondary | ICD-10-CM | POA: Diagnosis present

## 2023-05-24 DIAGNOSIS — Z91018 Allergy to other foods: Secondary | ICD-10-CM

## 2023-05-24 DIAGNOSIS — T462X6A Underdosing of other antidysrhythmic drugs, initial encounter: Secondary | ICD-10-CM | POA: Diagnosis present

## 2023-05-24 DIAGNOSIS — Z91048 Other nonmedicinal substance allergy status: Secondary | ICD-10-CM

## 2023-05-24 DIAGNOSIS — I252 Old myocardial infarction: Secondary | ICD-10-CM

## 2023-05-24 LAB — PROTIME-INR
INR: 1.5 — ABNORMAL HIGH (ref 0.8–1.2)
Prothrombin Time: 17.8 s — ABNORMAL HIGH (ref 11.4–15.2)

## 2023-05-24 LAB — CBC WITH DIFFERENTIAL/PLATELET
Abs Immature Granulocytes: 0.08 10*3/uL — ABNORMAL HIGH (ref 0.00–0.07)
Basophils Absolute: 0 10*3/uL (ref 0.0–0.1)
Basophils Relative: 0 %
Eosinophils Absolute: 0 10*3/uL (ref 0.0–0.5)
Eosinophils Relative: 0 %
HCT: 39.9 % (ref 39.0–52.0)
Hemoglobin: 12.7 g/dL — ABNORMAL LOW (ref 13.0–17.0)
Immature Granulocytes: 1 %
Lymphocytes Relative: 4 %
Lymphs Abs: 0.6 10*3/uL — ABNORMAL LOW (ref 0.7–4.0)
MCH: 31.2 pg (ref 26.0–34.0)
MCHC: 31.8 g/dL (ref 30.0–36.0)
MCV: 98 fL (ref 80.0–100.0)
Monocytes Absolute: 0.6 10*3/uL (ref 0.1–1.0)
Monocytes Relative: 4 %
Neutro Abs: 14.3 10*3/uL — ABNORMAL HIGH (ref 1.7–7.7)
Neutrophils Relative %: 91 %
Platelets: 373 10*3/uL (ref 150–400)
RBC: 4.07 MIL/uL — ABNORMAL LOW (ref 4.22–5.81)
RDW: 15 % (ref 11.5–15.5)
WBC: 15.6 10*3/uL — ABNORMAL HIGH (ref 4.0–10.5)
nRBC: 0 % (ref 0.0–0.2)

## 2023-05-24 LAB — TROPONIN I (HIGH SENSITIVITY)
Troponin I (High Sensitivity): 721 ng/L (ref <18)
Troponin I (High Sensitivity): 852 ng/L (ref <18)

## 2023-05-24 LAB — LACTIC ACID, PLASMA: Lactic Acid, Venous: 1.7 mmol/L (ref 0.5–1.9)

## 2023-05-24 LAB — DIGOXIN LEVEL: Digoxin Level: <0.2 ng/mL — ABNORMAL LOW (ref 0.8–2.0)

## 2023-05-24 LAB — BASIC METABOLIC PANEL
Anion gap: 11 (ref 5–15)
BUN: 21 mg/dL (ref 8–23)
CO2: 21 mmol/L — ABNORMAL LOW (ref 22–32)
Calcium: 9.1 mg/dL (ref 8.9–10.3)
Chloride: 105 mmol/L (ref 98–111)
Creatinine, Ser: 0.92 mg/dL (ref 0.61–1.24)
GFR, Estimated: >60 mL/min (ref >60)
Glucose, Bld: 152 mg/dL — ABNORMAL HIGH (ref 70–99)
Potassium: 4 mmol/L (ref 3.5–5.1)
Sodium: 137 mmol/L (ref 135–145)

## 2023-05-24 LAB — APTT: aPTT: 34 s (ref 24–36)

## 2023-05-24 LAB — BRAIN NATRIURETIC PEPTIDE: B Natriuretic Peptide: 2431.6 pg/mL — ABNORMAL HIGH (ref 0.0–100.0)

## 2023-05-24 MED ORDER — HEPARIN (PORCINE) 25000 UT/250ML-% IV SOLN
1850.0000 [IU]/h | INTRAVENOUS | Status: DC
  Administered 2023-05-24: 1200 [IU]/h via INTRAVENOUS
  Administered 2023-05-25: 1500 [IU]/h via INTRAVENOUS
  Administered 2023-05-26 – 2023-05-29 (×6): 1850 [IU]/h via INTRAVENOUS
  Filled 2023-05-24 (×8): qty 250

## 2023-05-24 MED ORDER — FUROSEMIDE 10 MG/ML IJ SOLN
40.0000 mg | Freq: Two times a day (BID) | INTRAMUSCULAR | Status: DC
  Administered 2023-05-24 – 2023-05-29 (×10): 40 mg via INTRAVENOUS
  Filled 2023-05-24 (×10): qty 4

## 2023-05-24 MED ORDER — ALBUTEROL SULFATE (2.5 MG/3ML) 0.083% IN NEBU
2.5000 mg | INHALATION_SOLUTION | RESPIRATORY_TRACT | Status: DC | PRN
  Administered 2023-06-05: 2.5 mg via RESPIRATORY_TRACT
  Filled 2023-05-24: qty 3

## 2023-05-24 MED ORDER — IOHEXOL 350 MG/ML SOLN
75.0000 mL | Freq: Once | INTRAVENOUS | Status: AC | PRN
  Administered 2023-05-24: 75 mL via INTRAVENOUS

## 2023-05-24 MED ORDER — ACETAMINOPHEN 650 MG RE SUPP: 650.0000 mg | Freq: Four times a day (QID) | RECTAL | Status: DC | PRN

## 2023-05-24 MED ORDER — ACETAMINOPHEN 325 MG PO TABS
650.0000 mg | ORAL_TABLET | Freq: Four times a day (QID) | ORAL | Status: DC | PRN
  Administered 2023-05-26 – 2023-06-05 (×2): 650 mg via ORAL
  Filled 2023-05-24 (×3): qty 2

## 2023-05-24 NOTE — ED Provider Triage Note (Signed)
 Emergency Medicine Provider Triage Evaluation Note  [name] , a 78 y.o. male  was evaluated in triage.  Pt complains of sob. Ongoing sob for several months, also having exertional CP for the same duration.  Hx of PE/DVT and cardiac disease.  Sent here from PCP office  Review of Systems  Positive: As above Negative: As above  Physical Exam  BP (!) 117/94 (BP Location: Left Arm)   Pulse 96   Temp 98.4 F (36.9 C)   Resp 16   SpO2 92%  Gen:   Awake, no distress   Resp:  Normal effort  MSK:   Moves extremities without difficulty  Other:  Wearing supplemental O2  Medical Decision Making  Medically screening exam initiated at 1:16 PM.  Appropriate orders placed.  SHAVAR GORKA was informed that the remainder of the evaluation will be completed by another provider, this initial triage assessment does not replace that evaluation, and the importance of remaining in the ED until their evaluation is complete.     Fayrene Helper, PA-C 05/24/23 1318

## 2023-05-24 NOTE — Consult Note (Addendum)
 Cardiology Consultation   Patient ID: GENERAL WEARING MRN: [id number]; DOB: 02/24/1946  Admit date: 05/24/2023 Date of Consult: 05/24/2023  PCP:  Miki Kins, FNP   [facility] Providers Cardiologist:  Adrian Blackwater, MD   {    Patient Profile:   Bruce [name] is a 78 y.o. male with a hx of COPD with ongoing tobacco use and home oxygen use of 3 L, pulmonary hypertension, paroxsymal atrial fibrillation, CAD, chronic biventricular HFrEF, history of colon cancer, lung cancer, prostate cancer, PE, DVT, medication noncompliance who is being seen 05/24/2023 for the evaluation of chest pain, shortness of breath at the request of Dr. Elayne Snare.  History of Present Illness:   Bruce [name] has past medical history as stated above. He was seen today in advanced heart failure clinic by Anna Genre, PA-C for follow-up. At this appointment he talked about significantly declining over the past week. He stated that he had been laying in bed most of the day, lost 4 lb this past week, has a poor appetite, finds himself short of breath even at rest. Her note states that the patient has had constant central chest pain, starting last night after he sneezed. In her note she says that he appears end-stage but due to his chest pain, dyspnea, LE edema, and hypoxia (SpO2 of 85%) she recommended patient to go to the ED for further evaluation.  Current medications were: Eliquis 5 mg BID, Farxiga 10 mg daily, digoxin 0.125 mg daily. Other medications were discontinued due to hypotension, hyperkalemia.The note indicates that paramedicine indicates that he has been throwing his medications in the trash.  In the ED, EKG did not show any acute ischemic changes, BNP was 2,431, troponin level 721, CXR showed small loculated right pleural effusion associated with right basilar atelectasis/inflammation.   After speaking with the patient, he states that sometimes he forgets his medications, paramedicine noted that he  was throwing them away. He states that he thinks he was getting progressively worse since having his PE in 04/2022. Currently he is complaining more of shortness of breath and believes that his pain in his back is from possible falls that he has been having, he reports 4 falls in the last couple of months, denies ever hitting his head.    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 01/01/2015   Complication of anesthesia    wake up with a head ache   COPD (chronic obstructive pulmonary disease) (HCC)    Coronary artery disease    Edema 08/16/2010   GERD (gastroesophageal reflux disease)    Headache    related to sinus congestion   History of blood transfusion    History of kidney stones    History of MI (myocardial infarction) 10/08/2013   Hypertension    Hypokalemia 04/09/2022   Intracranial bleed (HCC) 12/31/2018   Lactic acidosis 04/09/2022   Leg cramps 08/16/2010   Malignant neoplasm of prostate (HCC) 07/13/2016   Multiple closed facial bone fractures (HCC)  Myocardial infarction [facility])    '09 AND '12   Peripheral vascular disease (HCC)    Shortness of breath    With exertion .   Tibia/fibula fracture 10/06/2013   Tibial plateau fracture 12/29/2014   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HCC) 10/08/2013   UTI (urinary tract infection)    frequent UTI   Visual hallucination 03/18/2015   Past Surgical History:  Procedure Laterality Date   CARDIAC CATHETERIZATION     CHOLECYSTECTOMY     EXTERNAL FIXATION LEG Right 10/06/2013   Procedure: CLOSED REDUCTION RIGHT TIBIAL  PLATEAU FRACTURE, EXTERNAL FIXATION RIGHT LEG, PLACEMENT OF WOUND VAC;  Surgeon: Budd Palmer, MD;  Location: [facility] OR;  Service: Orthopedics;  Laterality: Right;   FEMORAL-POPLITEAL BYPASS GRAFT Right 10/06/2013   Procedure: RIGHT POPLITEAL-POPLITEAL ARTERY BYPASS GRAFT;  Surgeon: Larina Earthly, MD;  Location: [facility] OR;  Service: Vascular;  Laterality: Right;   FRACTURE SURGERY Right 2014   HARDWARE REMOVAL Right 12/02/2013   Procedure: REMOVAL EXTERNAL FIXATION RIGHT LEG ;  Surgeon: Budd Palmer, MD;  Location: [facility] OR;  Service: Orthopedics;  Laterality: Right;   HERNIA REPAIR Right 1990's   I & D EXTREMITY Right 10/09/2013   Procedure: IRRIGATION AND DEBRIDEMENT RIGHT LEG, CLOSURE  OF WOUNDS, PLACEMENT OF WOUND VAC ON EACH SIDE OF LEG;  Surgeon: Budd Palmer, MD;  Location: [facility] OR;  Service: Orthopedics;  Laterality: Right;   IR ANGIOGRAM PULMONARY RIGHT SELECTIVE  04/10/2022   IR ANGIOGRAM SELECTIVE EACH ADDITIONAL VESSEL  04/10/2022   IR NEPHROSTOMY PLACEMENT LEFT  06/19/2016   IR THROMBECT PRIM MECH INIT (INCLU) MOD SED  04/10/2022   IR US GUIDE VASC ACCESS RIGHT  04/10/2022   IVC filter  2009   placed @ [facility]/ Removed in 2010.   IVC Filter Removed     LEFT HEART CATH AND CORONARY ANGIOGRAPHY Right 10/11/2018   Procedure: LEFT HEART CATH AND CORONARY ANGIOGRAPHY;  Surgeon: Laurier Nancy, MD;  Location: [facility] INVASIVE CV LAB;  Service: Cardiovascular;  Laterality: Right;   ligament leg Left    NEPHROLITHOTOMY Left 06/19/2016   Procedure: NEPHROLITHOTOMY PERCUTANEOUS;  Surgeon: Vanna Scotland, MD;  Location: [facility] ORS;  Service: Urology;  Laterality: Left;   ORIF TIBIA FRACTURE Right 12/29/2014   Procedure: OPEN REDUCTION INTERNAL FIXATION (ORIF) RIGHT TIBIA FRACTURE, RIA VS ICBG;  Surgeon: Myrene Galas, MD;  Location: [facility] OR;  Service: Orthopedics;  Laterality: Right;   RADIOACTIVE SEED IMPLANT N/A 09/12/2016   Procedure: RADIOACTIVE SEED IMPLANT/BRACHYTHERAPY IMPLANT;  Surgeon: Vanna Scotland, MD;   Location: [facility] ORS;  Service: Urology;  Laterality: N/A;   RIGHT/LEFT HEART CATH AND CORONARY ANGIOGRAPHY N/A 04/13/2022   Procedure: RIGHT/LEFT HEART CATH AND CORONARY ANGIOGRAPHY;  Surgeon: Dolores Patty, MD;  Location: [facility] INVASIVE CV LAB;  Service: Cardiovascular;  Laterality: N/A;    Home Medications:  Prior to Admission medications   Medication Sig Start Date End Date Taking? Authorizing Provider  acetaminophen (TYLENOL) 500 MG tablet Take 2 tablets (1,000 mg total) by mouth every 6 (six) hours as needed for mild pain (pain score 1-3), moderate pain (pain score 4-6), fever or headache. 04/24/23   Champ Mungo, DO  albuterol (VENTOLIN HFA) 108 (90 Base) MCG/ACT inhaler TAKE TWO PUFFS BY MOUTH AS NEEDED FOR WHEEZING EVERY 4-6 HOURS 11/10/22   Laurier Nancy, MD  ALPRAZolam Prudy Feeler) 0.5 MG tablet Take 1 tablet (0.5 mg total) by mouth 2 (two) times daily as needed for anxiety. 05/02/23   Miki Kins, FNP  amiodarone (PACERONE) 200 MG tablet Take 0.5 tablets (100 mg total) by mouth daily. Patient taking differently: Take 100 mg by mouth in the morning. 08/25/22   Bensimhon, Bevelyn Buckles, MD  apixaban (ELIQUIS) 5 MG TABS tablet TAKE ONE TABLET (5 MG TOTAL) BY MOUTH TWO (TWO) TIMES DAILY. Patient taking differently: Take 5 mg by mouth 2 (two) times daily. 09/26/22   Bensimhon, Bevelyn Buckles, MD  budesonide (PULMICORT) 0.25 MG/2ML nebulizer solution Take 2 mLs (0.25 mg total) by nebulization 2 (two) times daily. 04/24/23   Champ Mungo, DO  cetirizine (ZYRTEC) 10 MG tablet Take 1 tablet (10 mg total) by mouth daily. Patient taking differently: Take 10 mg by mouth in the morning. 05/12/22   [name], NP  cholecalciferol (VITAMIN D3) 25 MCG (1000 UNIT) tablet Take 1,000 Units by mouth in the morning.    [provider]  digoxin (LANOXIN) 0.125 MG tablet TAKE ONE TABLET (0.125 MG TOTAL) BY MOUTH DAILY. Patient taking differently: Take 0.125 mg by mouth in the morning. 01/11/23   Bensimhon, Bevelyn Buckles, MD  Elastic Bandages & Supports (MEDICAL COMPRESSION STOCKINGS) MISC Wear daily as needed 05/18/23   Miki Kins, FNP  FARXIGA 10 MG TABS tablet TAKE ONE TABLET (10 MG TOTAL) BY MOUTH DAILY. Patient taking differently: Take 10 mg by mouth in the morning. 12/15/22   Milford, Anderson Malta, FNP  fluticasone (FLONASE) 50 MCG/ACT nasal spray Place 1 spray into both nostrils daily as needed for allergies. 03/26/23   [provider]  furosemide (LASIX) 20 MG tablet Take 2 tablets (40 mg total) by mouth daily. 05/23/23 05/22/24  Jacklynn Ganong, FNP  gabapentin (NEURONTIN) 100 MG capsule TAKE ONE CAPSULE (100 MG TOTAL) BY MOUTH THREE (THREE) TIMES DAILY. 05/03/23   Miki Kins, FNP  lidocaine (LIDODERM) 5 % Place 1 patch onto the skin daily. Remove & Discard patch within 12 hours or as directed by MD 05/12/22   [name], NP  Multiple Vitamin (MULTIVITAMIN) tablet Take 1 tablet by mouth in the morning. For Men    [provider]  nitroGLYCERIN (NITROSTAT) 0.4 MG SL tablet Place 0.4 mg under the tongue every 5 (five) minutes as needed for chest pain.    [provider]  Omega-3 Fatty Acids (FISH OIL PO) Take 1,000 mg by mouth every evening.    [provider]  omeprazole (PRILOSEC) 40 MG capsule Take 1 capsule (40 mg total) by mouth 2 (two) times daily. 05/02/23   Miki Kins, FNP  pantoprazole (PROTONIX) 40 MG tablet Take 1 tablet (40 mg total) by mouth daily. Patient taking differently: Take 40 mg by mouth in the morning. 01/10/23   Miki Kins, FNP  ranolazine (RANEXA) 500 MG 12 hr tablet TAKE ONE TABLET (500 MG TOTAL) BY MOUTH TWO (TWO) TIMES DAILY. Patient taking differently: Take 500 mg by mouth 2 (two) times daily. 01/11/23   Bensimhon, Bevelyn Buckles, MD  rOPINIRole (REQUIP) 1 MG tablet TAKE ONE TABLET (1 MG TOTAL) BY MOUTH IN THE MORNING AND AT BEDTIME. 05/23/23   Miki Kins, FNP  rosuvastatin (CRESTOR) 20 MG tablet Take 1 tablet (20 mg  total) by mouth daily. 04/24/23   Champ Mungo, DO  sildenafil (REVATIO) 20 MG tablet Take 1 tablet (20 mg total) by mouth 3 (three) times daily. 01/15/23   Milford, Anderson Malta, FNP  Tiotropium Bromide-Olodaterol (STIOLTO RESPIMAT) 2.5-2.5 MCG/ACT AERS Inhale 2 puffs into the lungs daily. 04/24/23 04/23/24  Champ Mungo, DO  Inpatient Medications: Scheduled Meds:  Continuous Infusions:  PRN Meds:  Allergies:    Allergies  Allergen Reactions   Adhesive [Tape] Other (See Comments)    After right leg fracture surgery, pt developed a large blister where tape was applied to his right leg. OK to use paper tape.   Eggs-Apples-Oats [Alitraq] Other (See Comments)    Caused chest pains   Imdur [Isosorbide Dinitrate] Other (See Comments)    hallucinations   Singulair [Montelukast Sodium] Other (See Comments)    Hallucinations    Social History:   Social History   Socioeconomic History   Marital status: Single    Spouse name: Not on file   Number of children: 2   Years of education: Not on file   Highest education level: 6th grade  Occupational History   Not on file  Tobacco Use   Smoking status: Former    Current packs/day: 0.00    Average packs/day: 0.5 packs/day for 52.0 years (26.0 ttl pk-yrs)    Types: Cigarettes    Start date: 07/05/1970    Quit date: 07/05/2022    Years since quitting: 0.8   Smokeless tobacco: Never  Vaping Use   Vaping status: Never Used  Substance and Sexual Activity   Alcohol use: No    Alcohol/week: 0.0 standard drinks of alcohol    Comment: Stopped 2009   Drug use: No   Sexual activity: Not Currently  Other Topics Concern   Not on file  Social History Narrative   ** Merged History Encounter **       Social Drivers of Health   Financial Resource Strain: High Risk (04/12/2022)   Overall Financial Resource Strain (CARDIA)    Difficulty of Paying Living Expenses: Hard  Food Insecurity: Food Insecurity Present (05/10/2023)   Hunger Vital Sign     Worried About Running Out of Food in the Last Year: Sometimes true    Ran Out of Food in the Last Year: Often true  Transportation Needs: Unmet Transportation Needs (05/23/2023)   PRAPARE - Administrator, Civil Service (Medical): Yes    Lack of Transportation (Non-Medical): No  Physical Activity: Inactive (02/05/2017)   Exercise Vital Sign    Days of Exercise per Week: 0 days    Minutes of Exercise per Session: 0 min  Stress: Stress Concern Present (02/05/2017)   [employer] of Occupational Health - Occupational Stress Questionnaire    Feeling of Stress : Rather much  Social Connections: Unknown (04/19/2023)   Social Connection and Isolation Panel [NHANES]    Frequency of Communication with Friends and Family: Twice a week    Frequency of Social Gatherings with Friends and Family: Once a week    Attends Religious Services: Patient declined    Database administrator or Organizations: No    Attends Banker Meetings: Never    Marital Status: Patient declined  Catering manager Violence: Not At Risk (05/10/2023)   Humiliation, Afraid, Rape, and Kick questionnaire    Fear of Current or Ex-Partner: No    Emotionally Abused: No    Physically Abused: No    Sexually Abused: No    Family History:   Family History  Problem Relation Age of Onset   Hypertension Mother    Prostate cancer Brother    Mental illness Neg Hx     ROS:  Please see the history of present illness.  All other ROS reviewed and negative.     Physical Exam/Data:  Vitals:   05/24/23 1155 05/24/23 1325 05/24/23 1535  BP: (!) 117/94  (!) 142/93  Pulse: 96  (!) 151  Resp: 16  (!) 25  Temp: 98.4 F (36.9 C)    SpO2: 92%  90%  Weight:  72.6 kg   Height:  6' (1.829 m)    No intake or output data in the 24 hours ending 05/24/23 1652    05/24/2023    1:25 PM 05/24/2023   10:33 AM 05/23/2023    9:22 AM  Last 3 Weights  Weight (lbs) 160 lb 160 lb 9.6 oz 155 lb 3.2 oz  Weight (kg) 72.576 kg  72.848 kg 70.398 kg     Body mass index is 21.7 kg/m.  General:  ill-appearing elderly male, cachectic, on 4L oxygen via [place] HEENT: normal Neck: + JVD Vascular: Distal pulses 2+ bilaterally Cardiac:  normal S1, S2; RRR; no murmur  Lungs:  diminished lung sounds, right worse than left Abd: soft, non-tender Ext: 3+ edema Musculoskeletal:  No deformities Skin: warm and dry  Neuro:   no focal abnormalities noted Psych:  Normal affect   EKG:  The EKG was personally reviewed and demonstrates:  sinus rhythm, HR 93 bpm, no signs of acute ischemia   Telemetry:  Telemetry was personally reviewed and demonstrates:  sinus rhythm, HR 80-90s  Relevant CV Studies: Echocardiogram 01/15/2023 IMPRESSIONS   1. Left ventricular ejection fraction, by estimation, is 40 to 45%. The  left ventricle has mildly decreased function. The left ventricle  demonstrates global hypokinesis. Left ventricular diastolic parameters are  indeterminate. There is the  interventricular septum is flattened in systole, consistent with right  ventricular pressure overload.   2. Right ventricular systolic function is moderately reduced. The right  ventricular size is moderately enlarged. There is mildly elevated  pulmonary artery systolic pressure. The estimated right ventricular  systolic pressure is 40.2 mmHg.   3. Left atrial size was mildly dilated.   4. Right atrial size was mildly dilated.   5. A small pericardial effusion is present. The pericardial effusion is  anterior to the right ventricle.   6. The mitral valve is grossly normal. No evidence of mitral valve  regurgitation. No evidence of mitral stenosis.   7. The aortic valve is tricuspid. There is mild calcification of the  aortic valve. Aortic valve regurgitation is not visualized. Aortic valve  sclerosis is present, with no evidence of aortic valve stenosis.   Laboratory Data:  High Sensitivity Troponin:   Recent Labs  Lab 05/24/23 1332   TROPONINIHS 721*     Chemistry Recent Labs  Lab 05/24/23 1332  NA 137  K 4.0  CL 105  CO2 21*  GLUCOSE 152*  BUN 21  CREATININE 0.92  CALCIUM 9.1  GFRNONAA >60  ANIONGAP 11    No results for input(s): "PROT", "ALBUMIN", "AST", "ALT", "ALKPHOS", "BILITOT" in the last 168 hours. Lipids No results for input(s): "CHOL", "TRIG", "HDL", "LABVLDL", "LDLCALC", "CHOLHDL" in the last 168 hours.  Hematology Recent Labs  Lab 05/24/23 1332  WBC 15.6*  RBC 4.07*  HGB 12.7*  HCT 39.9  MCV 98.0  MCH 31.2  MCHC 31.8  RDW 15.0  PLT 373   Thyroid No results for input(s): "TSH", "FREET4" in the last 168 hours.  BNP Recent Labs  Lab 05/24/23 1316  BNP 2,431.6*    DDimer No results for input(s): "DDIMER" in the last 168 hours.  Radiology/Studies:  CT Angio Chest/Abd/Pel for Dissection W and/or Wo Contrast Result  Date: 05/24/2023 CLINICAL DATA:  Acute aortic syndrome suspected. Concern for pulmonary embolism. Shortness of breath. EXAM: CT ANGIOGRAPHY CHEST, ABDOMEN AND PELVIS TECHNIQUE: Non-contrast CT of the chest was initially obtained. Multidetector CT imaging through the chest, abdomen and pelvis was performed using the standard protocol during bolus administration of intravenous contrast. Multiplanar reconstructed images and MIPs were obtained and reviewed to evaluate the vascular anatomy. RADIATION DOSE REDUCTION: This exam was performed according to the departmental dose-optimization program which includes automated exposure control, adjustment of the mA and/or kV according to patient size and/or use of iterative reconstruction technique. CONTRAST:  75mL OMNIPAQUE IOHEXOL 350 MG/ML SOLN COMPARISON:  Chest CT dated 04/09/2022. FINDINGS: Evaluation is limited due to streak artifact caused by patient's arms and overlying metallic support apparatus. CTA CHEST FINDINGS Cardiovascular: There is no cardiomegaly. Trace pericardial effusion. There is 3 vessel coronary vascular calcification.  Moderate atherosclerotic calcification of the thoracic aorta. No aneurysmal dilatation or dissection. The origins of the great vessels of the aortic arch appear patent. Large pulmonary artery embolus involving the right main pulmonary artery as seen on the prior CT extending into the right middle and right lower lobe branches. Interval resolution of the previously seen left lower lobe pulmonary artery emboli. Small nonocclusive clot in the lingular branch seen Callie decreased since the prior CT. No CT evidence of right heart straining. Mediastinum/Nodes: No hilar or mediastinal adenopathy. The esophagus and the thyroid gland are grossly unremarkable. No mediastinal fluid collection. Lungs/Pleura: Moderate right pleural effusion with partial compressive atelectasis of the right lung. Pneumonia is not excluded. Background of centrilobular emphysema. There are bilateral upper lobe linear atelectasis/scarring. Faint ground-glass nodule in the lingula measures 4 mm. Additional subpleural nodules in the medial left lung base versus areas of scarring measure up to 7 mm. No pneumothorax. The central airways are patent. Musculoskeletal: Degenerative changes of the spine. No acute osseous pathology. Review of the MIP images confirms the above findings. CTA ABDOMEN AND PELVIS FINDINGS VASCULAR Aorta: Moderate atherosclerotic calcification. No aneurysmal dilatation or dissection. No periaortic fluid collection. Celiac: The celiac trunk and its major branches are patent. SMA: Atherosclerotic calcification of the origin of the SMA. The SMA is patent. Renals: The renal arteries are patent. IMA: The IMA is patent. Inflow: Moderate atherosclerotic calcification of the iliac arteries. There is high-grade narrowing of the right internal iliac artery as seen previously. The external iliac arteries remain patent. Veins: No obvious venous abnormality within the limitations of this arterial phase study. Review of the MIP images confirms  the above findings. NON-VASCULAR No intra-abdominal free air or free fluid. Hepatobiliary: The liver is unremarkable.  Cholecystectomy. Pancreas: Unremarkable. No pancreatic ductal dilatation or surrounding inflammatory changes. Spleen: Normal in size without focal abnormality. Adrenals/Urinary Tract: The adrenal glands unremarkable. There is no hydronephrosis on either side. The urinary bladder is grossly unremarkable. Stomach/Bowel: Moderate stool throughout the colon. There is no bowel obstruction. No CT evidence of acute appendicitis. Lymphatic: No adenopathy. Reproductive: Prostate brachytherapy seeds. Other: Diffuse subcutaneous edema.  No fluid collection. Musculoskeletal: Osteopenia with degenerative changes of the spine. No acute osseous pathology. Review of the MIP images confirms the above findings. IMPRESSION: 1. Large pulmonary artery embolus involving the right main pulmonary artery as seen on the prior CT extending into the right middle and right lower lobe branches. Interval resolution of the previously seen left lower lobe pulmonary artery emboli. No CT evidence of right heart straining. 2. Moderate right pleural effusion with partial compressive atelectasis of the right  lung. Pneumonia is not excluded. 3. No acute intra-abdominal or pelvic pathology. 4. Aortic Atherosclerosis (ICD10-I70.0) and Emphysema (ICD10-J43.9). Electronically Signed   By: Elgie Collard M.D.   On: 05/24/2023 16:48   DG Chest 2 View Result Date: 05/24/2023 CLINICAL DATA:  Shortness of breath. EXAM: CHEST - 2 VIEW COMPARISON:  April 18, 2023. FINDINGS: The heart size and mediastinal contours are within normal limits. Left lung is clear. Small loculated right pleural effusion is noted with associated right basilar atelectasis or inflammation. The visualized skeletal structures are unremarkable. IMPRESSION: Small loculated right pleural effusion is noted with associated right basilar atelectasis or inflammation.  Electronically Signed   By: Lupita Raider M.D.   On: 05/24/2023 15:39   Assessment and Plan:   Acute on chronic biventricular heart failure Echocardiogram from 01/2023 showed: EF 40-45% (noted to be 20-25% on 04/2022), moderately reduced RV function, biatrial enlargement, small pericardial effusion.  He reports being noncompliant with his medications -- paramedicine reports that they find him throwing them into the trash  Chronic COPD on home oxygen (3-4 L) but reports feeling more short of breath in last couple of days Appears mildly volume overloaded on exam, LE edema, JVP, diminished lung sounds  He says he was supposed to start taking PO Lasix this Tuesday -- he never started  Will need IV Lasix for diuresis -- start IV Lasix 40 mg BID  Can resume/restart home medications   Paroxysmal atrial fibrillation  Currently in NSR Was supposed to be taking amiodarone 100 mg daily, digoxin 0.125 mg daily and Eliquis 5 mg BID -- unsure of compliance to these medications  Can restart these medications on admission   Chest pain Shortness of breath Elevated troponin  Large PE Patient reports feeling poorly after having his PE February 2024  He has been feeling more short of breath  First troponin 721, pending second result  Underwent CTA which showed: large PE, moderate pleural effusion Going to get pulmonary critical care involved in regards to treatment for his shortness of breath as it appears pulmonary in nature   Goals of care Attempted to discuss palliative care options with the patient but he did not really engage in the conversation 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  New York Heart Association (NYHA) Functional Class NYHA Class III  CHA2DS2-VASc Score = 4   This indicates a 4.8% annual risk of stroke. The patient's score is based upon: CHF History: 1 HTN History: 0 Diabetes History: 0 Stroke History: 0 Vascular Disease History: 1 Age Score: 2 Gender Score: 0        For questions or updates, please contact [facility] Please consult [email or web address] for contact info under    Signed, Olena Leatherwood, PA-C  05/24/2023 4:52 PM

## 2023-05-24 NOTE — Progress Notes (Signed)
 PHARMACY - ANTICOAGULATION CONSULT NOTE  Pharmacy Consult for heparin Indication: pulmonary embolus  Allergies  Allergen Reactions   Adhesive [Tape] Other (See Comments)    After right leg fracture surgery, pt developed a large blister where tape was applied to his right leg. OK to use paper tape.   Eggs-Apples-Oats [Alitraq] Other (See Comments)    Caused chest pains   Imdur [Isosorbide Dinitrate] Other (See Comments)    hallucinations   Singulair [Montelukast Sodium] Other (See Comments)    Hallucinations     Patient Measurements: Height: 6' (182.9 cm) Weight: 72.6 kg (160 lb) IBW/kg (Calculated) : 77.6 Heparin Dosing Weight: 72.6 kg   Vital Signs: Temp: 98.4 F (36.9 C) (03/20 1155) BP: 112/100 (03/20 1630) Pulse Rate: 94 (03/20 1630)  Labs: Recent Labs    05/24/23 1332 05/24/23 1544  HGB 12.7*  --   HCT 39.9  --   PLT 373  --   CREATININE 0.92  --   TROPONINIHS 721* 852*    Estimated Creatinine Clearance: 71.2 mL/min (by C-G formula based on SCr of 0.92 mg/dL).   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 01/01/2015   Complication of anesthesia    wake up with a head ache   COPD (chronic obstructive pulmonary disease) (HCC)    Coronary artery disease    Edema 08/16/2010   GERD (gastroesophageal reflux disease)    Headache    related to sinus congestion   History of blood transfusion    History of kidney stones    History of MI (myocardial infarction) 10/08/2013   Hypertension    Hypokalemia 04/09/2022   Intracranial  bleed (HCC) 12/31/2018   Lactic acidosis 04/09/2022   Leg cramps 08/16/2010   Malignant neoplasm of prostate (HCC) 07/13/2016   Multiple closed facial bone fractures (HCC)    Myocardial infarction [facility])    '09 AND '12   Peripheral vascular disease (HCC)    Shortness of breath    With exertion .   Tibia/fibula fracture 10/06/2013   Tibial plateau fracture 12/29/2014   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HCC) 10/08/2013   UTI (urinary tract infection)    frequent UTI   Visual hallucination 03/18/2015    Medications:  Scheduled:   furosemide  40 mg Intravenous BID    Assessment: 77 YOM who presents with increased L sided chest pain and SOB. He has a history of a PE for which he was prescribed Eliquis 5 mg PO BID. He has not been compliant with his Eliquis, states that the forgets to take his medications and paramedicine noted that he has been throwing pills away. Dispense report does support non-compliance with last fill in December 2024.   Hgb stable at 12.7, PLT stable at 373. Unknown last dose of Eliquis, however will forego bolus due to history of Eliquis use. Will obtain both aPTT and heparin level to monitor therapy until levels correlate.   Goal of Therapy:  Heparin level 0.3-0.7 units/ml aPTT 66-102 seconds Monitor platelets by  anticoagulation protocol: Yes   Plan:  Start heparin infusion at 1200 units/hr Obtain aPTT and heparin level in 8 hours and daily while on heparin Obtain CBC daily and monitor for s/sx of bleeding  Follow-up ability to resume oral anticoagulation  Lennie Muckle, PharmD PGY1 Pharmacy Resident 05/24/2023 5:35 PM

## 2023-05-24 NOTE — Patient Instructions (Signed)
 PLEASE REPORT TO EMERGENCY ROOM FOR EVALUATION   Follow-Up in: TO BE DETERMINED  FOLLOWING ER EVAL  At the [facility], you and your health needs are our priority. We have a designated team specialized in the treatment of Heart Failure. This Care Team includes your primary Heart Failure Specialized Cardiologist (physician), Advanced Practice Providers (APPs- Physician Assistants and Nurse Practitioners), and Pharmacist who all work together to provide you with the care you need, when you need it.   You may see any of the following providers on your designated Care Team at your next follow up:  Dr. Arvilla Meres Dr. Marca Ancona Dr. Dorthula Nettles Dr. Theresia Bough Tonye Becket, NP Robbie Lis, [place] [facility] [place], [place] Brynda Peon, NP [place] Lee, NP Karle Plumber, PharmD   Please be sure to bring in all your medications bottles to every appointment.   Need to Contact us:  If you have any questions or concerns before your next appointment please send [place] a message through [place] or call our office at [phone].    TO LEAVE A MESSAGE FOR THE NURSE SELECT OPTION 2, PLEASE LEAVE A MESSAGE INCLUDING: YOUR NAME DATE OF BIRTH CALL BACK NUMBER REASON FOR CALL**this is important as we prioritize the call backs  YOU WILL RECEIVE A CALL BACK THE SAME DAY AS LONG AS YOU CALL BEFORE 4:00 PM

## 2023-05-24 NOTE — Addendum Note (Signed)
 Encounter addended by: Andrey Farmer, PA-C on: 05/24/2023 3:30 PM  Actions taken: Clinical Note Signed

## 2023-05-24 NOTE — ED Notes (Signed)
 1st lac in normal range 2nd can be canceled

## 2023-05-24 NOTE — ED Notes (Signed)
 CCMD called for cardiac monitoring.

## 2023-05-24 NOTE — ED Provider Notes (Signed)
 [place] EMERGENCY DEPARTMENT AT [facility] Provider Note   CSN: [id number] Arrival date & time: 05/24/23  1116     History  Chief Complaint  Patient presents with   Shortness of Breath    Bruce [name] is a 78 y.o. male who presents today for chest pain and shortness of breath.  Last night, patient sneezed and had sudden onset left midsternal anterior chest pain that radiates to his back.  Patient also endorses acute onset abdominal pain that started at the same time.  Patient reports not being compliant with his medications, including Eliquis.  78 y.o. with COPD and ongoing tobacco use, CAD, systolic HF EF 40-45%, colon CA previous lung CA, prostate cancer, PE, DVT, and HFrEF.   Recent admission 2/25 with acute on chronic respiratory failure and volume overload, flu A.  Diuresis limited by hypotension.  Shortness of Breath      Home Medications Prior to Admission medications   Medication Sig Start Date End Date Taking? Authorizing Provider  acetaminophen (TYLENOL) 500 MG tablet Take 2 tablets (1,000 mg total) by mouth every 6 (six) hours as needed for mild pain (pain score 1-3), moderate pain (pain score 4-6), fever or headache. 04/24/23   Champ Mungo, DO  albuterol (VENTOLIN HFA) 108 (90 Base) MCG/ACT inhaler TAKE TWO PUFFS BY MOUTH AS NEEDED FOR WHEEZING EVERY 4-6 HOURS 11/10/22   Laurier Nancy, MD  ALPRAZolam Prudy Feeler) 0.5 MG tablet Take 1 tablet (0.5 mg total) by mouth 2 (two) times daily as needed for anxiety. 05/02/23   Miki Kins, FNP  amiodarone (PACERONE) 200 MG tablet Take 0.5 tablets (100 mg total) by mouth daily. Patient taking differently: Take 100 mg by mouth in the morning. 08/25/22   Bensimhon, Bevelyn Buckles, MD  apixaban (ELIQUIS) 5 MG TABS tablet TAKE ONE TABLET (5 MG TOTAL) BY MOUTH TWO (TWO) TIMES DAILY. Patient taking differently: Take 5 mg by mouth 2 (two) times daily. 09/26/22   Bensimhon, Bevelyn Buckles, MD  budesonide (PULMICORT) 0.25 MG/2ML  nebulizer solution Take 2 mLs (0.25 mg total) by nebulization 2 (two) times daily. 04/24/23   Champ Mungo, DO  cetirizine (ZYRTEC) 10 MG tablet Take 1 tablet (10 mg total) by mouth daily. Patient taking differently: Take 10 mg by mouth in the morning. 05/12/22   [name], NP  cholecalciferol (VITAMIN D3) 25 MCG (1000 UNIT) tablet Take 1,000 Units by mouth in the morning.    [provider]  digoxin (LANOXIN) 0.125 MG tablet TAKE ONE TABLET (0.125 MG TOTAL) BY MOUTH DAILY. Patient taking differently: Take 0.125 mg by mouth in the morning. 01/11/23   Bensimhon, Bevelyn Buckles, MD  Elastic Bandages & Supports (MEDICAL COMPRESSION STOCKINGS) MISC Wear daily as needed 05/18/23   Miki Kins, FNP  FARXIGA 10 MG TABS tablet TAKE ONE TABLET (10 MG TOTAL) BY MOUTH DAILY. Patient taking differently: Take 10 mg by mouth in the morning. 12/15/22   Milford, Anderson Malta, FNP  fluticasone (FLONASE) 50 MCG/ACT nasal spray Place 1 spray into both nostrils daily as needed for allergies. 03/26/23   [provider]  furosemide (LASIX) 20 MG tablet Take 2 tablets (40 mg total) by mouth daily. 05/23/23 05/22/24  Jacklynn Ganong, FNP  gabapentin (NEURONTIN) 100 MG capsule TAKE ONE CAPSULE (100 MG TOTAL) BY MOUTH THREE (THREE) TIMES DAILY. 05/03/23   Miki Kins, FNP  lidocaine (LIDODERM) 5 % Place 1 patch onto the skin daily. Remove & Discard patch within 12 hours  or as directed by MD 05/12/22   [name], NP  Multiple Vitamin (MULTIVITAMIN) tablet Take 1 tablet by mouth in the morning. For Men    [provider]  nitroGLYCERIN (NITROSTAT) 0.4 MG SL tablet Place 0.4 mg under the tongue every 5 (five) minutes as needed for chest pain.    [provider]  Omega-3 Fatty Acids (FISH OIL PO) Take 1,000 mg by mouth every evening.    [provider]  omeprazole (PRILOSEC) 40 MG capsule Take 1 capsule (40 mg total) by mouth 2 (two) times daily. 05/02/23   Miki Kins, FNP   pantoprazole (PROTONIX) 40 MG tablet Take 1 tablet (40 mg total) by mouth daily. Patient taking differently: Take 40 mg by mouth in the morning. 01/10/23   Miki Kins, FNP  ranolazine (RANEXA) 500 MG 12 hr tablet TAKE ONE TABLET (500 MG TOTAL) BY MOUTH TWO (TWO) TIMES DAILY. Patient taking differently: Take 500 mg by mouth 2 (two) times daily. 01/11/23   Bensimhon, Bevelyn Buckles, MD  rOPINIRole (REQUIP) 1 MG tablet TAKE ONE TABLET (1 MG TOTAL) BY MOUTH IN THE MORNING AND AT BEDTIME. 05/23/23   Miki Kins, FNP  rosuvastatin (CRESTOR) 20 MG tablet Take 1 tablet (20 mg total) by mouth daily. 04/24/23   Champ Mungo, DO  sildenafil (REVATIO) 20 MG tablet Take 1 tablet (20 mg total) by mouth 3 (three) times daily. 01/15/23   Milford, Anderson Malta, FNP  Tiotropium Bromide-Olodaterol (STIOLTO RESPIMAT) 2.5-2.5 MCG/ACT AERS Inhale 2 puffs into the lungs daily. 04/24/23 04/23/24  Champ Mungo, DO      Allergies    Adhesive [tape], Eggs-apples-oats [alitraq], Imdur [isosorbide dinitrate], and Singulair [montelukast sodium]    Review of Systems   Review of Systems  Respiratory:  Positive for shortness of breath.     Physical Exam Updated Vital Signs BP 115/67   Pulse 85   Temp 98.2 F (36.8 C)   Resp (!) 27   Ht 6' (1.829 m)   Wt 72.6 kg   SpO2 97%   BMI 21.70 kg/m  Physical Exam Vitals and nursing note reviewed.  Constitutional:      Appearance: He is ill-appearing.     Comments: On 3 L O2 via nasal cannula (baseline)  HENT:     Head: Normocephalic and atraumatic.  Cardiovascular:     Rate and Rhythm: Normal rate and regular rhythm.     Pulses:          Radial pulses are 2+ on the right side.     Heart sounds: Heart sounds are distant.  Pulmonary:     Effort: Accessory muscle usage and respiratory distress present.     Breath sounds: Examination of the right-lower field reveals decreased breath sounds. Decreased breath sounds present.  Abdominal:     Palpations: Abdomen is soft.      Tenderness: There is abdominal tenderness in the right upper quadrant and epigastric area.  Musculoskeletal:     Right lower leg: 2+ Pitting Edema present.     Left lower leg: 2+ Pitting Edema present.     Comments: Tenderness to right calf and anterior right lower leg palpation.  Skin:    General: Skin is warm and dry.     Capillary Refill: Capillary refill takes less than 2 seconds.  Psychiatric:        Behavior: Behavior is cooperative.     ED Results / Procedures / Treatments   Labs (all labs ordered are  listed, but only abnormal results are displayed) Labs Reviewed  BRAIN NATRIURETIC PEPTIDE - Abnormal; Notable for the following components:      Result Value   B Natriuretic Peptide 2,431.6 (*)    All other components within normal limits  DIGOXIN LEVEL - Abnormal; Notable for the following components:   Digoxin Level <0.2 (*)    All other components within normal limits  BASIC METABOLIC PANEL - Abnormal; Notable for the following components:   CO2 21 (*)    Glucose, Bld 152 (*)    All other components within normal limits  CBC WITH DIFFERENTIAL/PLATELET - Abnormal; Notable for the following components:   WBC 15.6 (*)    RBC 4.07 (*)    Hemoglobin 12.7 (*)    Neutro Abs 14.3 (*)    Lymphs Abs 0.6 (*)    Abs Immature Granulocytes 0.08 (*)    All other components within normal limits  PROTIME-INR - Abnormal; Notable for the following components:   Prothrombin Time 17.8 (*)    INR 1.5 (*)    All other components within normal limits  TROPONIN I (HIGH SENSITIVITY) - Abnormal; Notable for the following components:   Troponin I (High Sensitivity) 721 (*)    All other components within normal limits  TROPONIN I (HIGH SENSITIVITY) - Abnormal; Notable for the following components:   Troponin I (High Sensitivity) 852 (*)    All other components within normal limits  APTT  LACTIC ACID, PLASMA  CBC WITH DIFFERENTIAL/PLATELET  LACTIC ACID, PLASMA  HEPARIN LEVEL  (UNFRACTIONATED)  APTT  CBC  BASIC METABOLIC PANEL    EKG None  Radiology CT Angio Chest/Abd/Pel for Dissection W and/or Wo Contrast Result Date: 05/24/2023 CLINICAL DATA:  Acute aortic syndrome suspected. Concern for pulmonary embolism. Shortness of breath. EXAM: CT ANGIOGRAPHY CHEST, ABDOMEN AND PELVIS TECHNIQUE: Non-contrast CT of the chest was initially obtained. Multidetector CT imaging through the chest, abdomen and pelvis was performed using the standard protocol during bolus administration of intravenous contrast. Multiplanar reconstructed images and MIPs were obtained and reviewed to evaluate the vascular anatomy. RADIATION DOSE REDUCTION: This exam was performed according to the departmental dose-optimization program which includes automated exposure control, adjustment of the mA and/or kV according to patient size and/or use of iterative reconstruction technique. CONTRAST:  75mL OMNIPAQUE IOHEXOL 350 MG/ML SOLN COMPARISON:  Chest CT dated 04/09/2022. FINDINGS: Evaluation is limited due to streak artifact caused by patient's arms and overlying metallic support apparatus. CTA CHEST FINDINGS Cardiovascular: There is no cardiomegaly. Trace pericardial effusion. There is 3 vessel coronary vascular calcification. Moderate atherosclerotic calcification of the thoracic aorta. No aneurysmal dilatation or dissection. The origins of the great vessels of the aortic arch appear patent. Large pulmonary artery embolus involving the right main pulmonary artery as seen on the prior CT extending into the right middle and right lower lobe branches. Interval resolution of the previously seen left lower lobe pulmonary artery emboli. Small nonocclusive clot in the lingular branch seen Bruce [name] decreased since the prior CT. No CT evidence of right heart straining. Mediastinum/Nodes: No hilar or mediastinal adenopathy. The esophagus and the thyroid gland are grossly unremarkable. No mediastinal fluid collection.  Lungs/Pleura: Moderate right pleural effusion with partial compressive atelectasis of the right lung. Pneumonia is not excluded. Background of centrilobular emphysema. There are bilateral upper lobe linear atelectasis/scarring. Faint ground-glass nodule in the lingula measures 4 mm. Additional subpleural nodules in the medial left lung base versus areas of scarring measure up to 7 mm. No  pneumothorax. The central airways are patent. Musculoskeletal: Degenerative changes of the spine. No acute osseous pathology. Review of the MIP images confirms the above findings. CTA ABDOMEN AND PELVIS FINDINGS VASCULAR Aorta: Moderate atherosclerotic calcification. No aneurysmal dilatation or dissection. No periaortic fluid collection. Celiac: The celiac trunk and its major branches are patent. SMA: Atherosclerotic calcification of the origin of the SMA. The SMA is patent. Renals: The renal arteries are patent. IMA: The IMA is patent. Inflow: Moderate atherosclerotic calcification of the iliac arteries. There is high-grade narrowing of the right internal iliac artery as seen previously. The external iliac arteries remain patent. Veins: No obvious venous abnormality within the limitations of this arterial phase study. Review of the MIP images confirms the above findings. NON-VASCULAR No intra-abdominal free air or free fluid. Hepatobiliary: The liver is unremarkable.  Cholecystectomy. Pancreas: Unremarkable. No pancreatic ductal dilatation or surrounding inflammatory changes. Spleen: Normal in size without focal abnormality. Adrenals/Urinary Tract: The adrenal glands unremarkable. There is no hydronephrosis on either side. The urinary bladder is grossly unremarkable. Stomach/Bowel: Moderate stool throughout the colon. There is no bowel obstruction. No CT evidence of acute appendicitis. Lymphatic: No adenopathy. Reproductive: Prostate brachytherapy seeds. Other: Diffuse subcutaneous edema.  No fluid collection. Musculoskeletal:  Osteopenia with degenerative changes of the spine. No acute osseous pathology. Review of the MIP images confirms the above findings. IMPRESSION: 1. Large pulmonary artery embolus involving the right main pulmonary artery as seen on the prior CT extending into the right middle and right lower lobe branches. Interval resolution of the previously seen left lower lobe pulmonary artery emboli. No CT evidence of right heart straining. 2. Moderate right pleural effusion with partial compressive atelectasis of the right lung. Pneumonia is not excluded. 3. No acute intra-abdominal or pelvic pathology. 4. Aortic Atherosclerosis (ICD10-I70.0) and Emphysema (ICD10-J43.9). Electronically Signed   By: Elgie Collard M.D.   On: 05/24/2023 16:48   DG Chest 2 View Result Date: 05/24/2023 CLINICAL DATA:  Shortness of breath. EXAM: CHEST - 2 VIEW COMPARISON:  April 18, 2023. FINDINGS: The heart size and mediastinal contours are within normal limits. Left lung is clear. Small loculated right pleural effusion is noted with associated right basilar atelectasis or inflammation. The visualized skeletal structures are unremarkable. IMPRESSION: Small loculated right pleural effusion is noted with associated right basilar atelectasis or inflammation. Electronically Signed   By: Lupita Raider M.D.   On: 05/24/2023 15:39    Procedures Procedures    Medications Ordered in ED Medications  furosemide (LASIX) injection 40 mg (40 mg Intravenous Given 05/24/23 1719)  heparin ADULT infusion 100 units/mL (25000 units/269mL) (1,200 Units/hr Intravenous New Bag/Given 05/24/23 1830)  acetaminophen (TYLENOL) tablet 650 mg (has no administration in time range)    Or  acetaminophen (TYLENOL) suppository 650 mg (has no administration in time range)  albuterol (PROVENTIL) (2.5 MG/3ML) 0.083% nebulizer solution 2.5 mg (has no administration in time range)  iohexol (OMNIPAQUE) 350 MG/ML injection 75 mL (75 mLs Intravenous Contrast Given  05/24/23 1607)    ED Course/ Medical Decision Making/ A&P                                Medical Decision Making Amount and/or Complexity of Data Reviewed Labs: ordered. Radiology: ordered.  Risk Prescription drug management. Decision regarding hospitalization.   78 y.o. with COPD and ongoing tobacco use, CAD, systolic HF EF 40-45%, colon CA previous lung CA, prostate cancer, PE, DVT, and  HFrEF presents with acute onset chest pain and shortness of breath.  Initial concern for ACS, PE/DVT, aortic dissection, pneumothorax, pneumonia, pleural effusion, pleural bleb.  Initial EKG from triage shows sinus rhythm of 99 bpm with PVCs and an incomplete right bundle branch block.  Similar to February 2025 EKG.  Second EKG from triage shows some concern for ST elevations in V1 with inferior depressions.  Will request a 3rd EKG for evaluation.  Labs notable for hemoglobin at patient's baseline, leukocytosis with elevated WBC 15.6 with neutrophilic predominance.  No electrolyte or metabolic derangements, appropriate glucose.  Renal function WNL.  BNP elevated at 2400; elevated from 1 month ago at 1400.  Troponins elevated at 721 with reflux at 852.  Discussed patient with cardiology, Dr. Rosann Auerbach, who agrees to see and evaluate the patient while in the ED and follow upon admission given elevated troponin and concerning EKG. Cardiology initiated patient on 40 mg of IV Lasix given elevated BNP.  Chest x-ray notable for small right pleural effusion.  CTA chest notable for large pulmonary artery embolus in right main pulmonary artery into right middle and right lower lobe branches, moderate right pleural effusion with compressive atelectasis of the right lung, and no noted right heart strain on the impression. Patient initiated on heparin drip given his lack of compliance with Eliquis.  PTT as well as INR and PT will also be drawn.  Patient is on 4 L for oxygen saturations in the mid to upper 90s.  Cardiology  recommended consultation of pulmonary critical care medicine.  Patient was discussed with intensivist who agrees to provide recommendations.  Patient was subsequently discussed with hospitalist, Dr. Gasper Sells, who will admit the patient for further evaluation and management.  There were no further emergent interventions in the ED prior to transfer to the inpatient floor.  Final Clinical Impression(s) / ED Diagnoses Final diagnoses:  Pulmonary embolism on right (HCC)  Pleural effusion, right    Rx / DC Orders ED Discharge Orders     None      Renella Cunas, PGY 2 Emergency medicine    Renella Cunas, MD 05/24/23 2107    Royanne Foots, DO 05/30/23 1134

## 2023-05-24 NOTE — ED Triage Notes (Addendum)
 Pt to ED c/o [facility] and generalized weakness x months. Home o2 user @3L . HX COPD, CHF

## 2023-05-24 NOTE — Patient Outreach (Signed)
 Care Management  Transitions of Care Program Transitions of Care Post-discharge Week 5  05/24/2023 Name: Bruce [name] MRN: [id number] DOB: Apr 21, 1945  Subjective: Bruce [name] is a 78 y.o. year old male who is a primary care patient of Miki Kins, FNP. The Care Management team was unable to reach the patient by phone to assess and address transitions of care needs.   Plan: Additional outreach attempts will be made to reach the patient enrolled in the [facility] Program (Post Inpatient/ED Visit).  The patient went to the CHF clinic and was sent to the ED  Deidre Ala, BSN, RN [place]  [facility] RN Care Manager [phone]

## 2023-05-24 NOTE — Progress Notes (Signed)
 Paramedicine Encounter  Patient ID: Bruce [name], male, DOB: 05-25-1945, 78 y.o.,  MRN: [id number]  Met patient in clinic today with Anna Genre. [name] was brought to ER by Maretta Los- He arrived in clinic and was noted to be short of breath on oxygen at 3LPM. Vitals were obtained by nurse and meds were reviewed and confirmed. [name] reports he is having chest pain localized to the left chest that is constant and does not worsen with palpation. He has severe lower leg edema. Low oxygen sats. Lillia Abed recommend [name] report to the ER for follow up. I will follow up in the home once he is discharged.   Weight @ clinic-160lbs  Weight @ home- 155lbs   B/P- 132/72 P-100  SP02-85% 3.5LPM  REDS CLIP- N/A  Med changes- none   Social Changes- none    [name], EMT-Paramedic [phone] 05/24/2023

## 2023-05-24 NOTE — ED Notes (Signed)
 Report given to [facility].

## 2023-05-24 NOTE — H&P (Signed)
 History and Physical    Patient: Bruce [name] ACZ:[id number] DOB: [birth year] DOA: 05/24/2023 DOS: the patient was seen and examined on 05/24/2023 PCP: Miki Kins, FNP  Patient coming from: Home  Chief Complaint:  Chief Complaint  Patient presents with   Shortness of Breath   HPI: Bruce [name] is a 78 y.o. male with medical history significant for COPD on 3 L O2 nasal cannula chronically, bilateral PE in February 2024 status post thrombolytics.  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  He has also survived lung cancer, prostate cancer, and colon cancer.  The patient presented to a routine cardiology appointment today with complaints of chest discomfort and O2 sats in the mid 80s.  He was referred to the emergency department at that time.  The workup in the emergency department led to a CTA and the diagnosis of the right mainstem PE.  The patient was seen in consultation by cardiology and by pulmonary medicine.  His troponin was elevated and he has evidence of congestive heart failure with a right pleural effusion and bilateral lower extremity edema well as an elevated BNP.   The patient will be admitted to the hospitalist service on a heparin drip and IV Lasix for his heart failure.  He is currently requiring 4 L O2 nasal cannula in the ED.    Review of Systems: Unable to review all systems due to lack of cooperation from patient. 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 01/01/2015   Complication of anesthesia    wake up with a head ache   COPD (chronic obstructive pulmonary disease) (HCC)    Coronary artery disease    Edema 08/16/2010   GERD (gastroesophageal reflux disease)    Headache    related to sinus congestion   History of blood transfusion    History of kidney stones    History of MI (myocardial infarction) 10/08/2013   Hypertension    Hypokalemia 04/09/2022   Intracranial bleed (HCC) 12/31/2018   Lactic acidosis 04/09/2022   Leg cramps 08/16/2010   Malignant neoplasm of prostate (HCC) 07/13/2016   Multiple closed facial bone fractures (HCC)    Myocardial infarction [facility])    '09 AND '12   Peripheral vascular disease (HCC)    Shortness of breath    With exertion .   Tibia/fibula fracture 10/06/2013   Tibial plateau fracture 12/29/2014   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HCC) 10/08/2013   UTI (urinary tract infection)    frequent UTI   Visual hallucination 03/18/2015   Past Surgical History:  Procedure Laterality Date   CARDIAC CATHETERIZATION     CHOLECYSTECTOMY     EXTERNAL FIXATION LEG Right 10/06/2013   Procedure: CLOSED REDUCTION RIGHT TIBIAL PLATEAU FRACTURE, EXTERNAL FIXATION RIGHT LEG, PLACEMENT OF WOUND VAC;  Surgeon: Doralee Albino  Carola Frost, MD;  Location: [facility] OR;  Service: Orthopedics;  Laterality: Right;   FEMORAL-POPLITEAL BYPASS GRAFT Right 10/06/2013   Procedure: RIGHT POPLITEAL-POPLITEAL ARTERY BYPASS GRAFT;  Surgeon: Larina Earthly, MD;  Location: [facility] OR;  Service: Vascular;  Laterality: Right;   FRACTURE SURGERY Right 2014   HARDWARE REMOVAL Right 12/02/2013   Procedure: REMOVAL EXTERNAL FIXATION RIGHT LEG ;  Surgeon: Budd Palmer, MD;  Location: [facility] OR;  Service: Orthopedics;  Laterality: Right;   HERNIA REPAIR Right 1990's   I & D EXTREMITY Right 10/09/2013   Procedure: IRRIGATION AND  DEBRIDEMENT RIGHT LEG, CLOSURE  OF WOUNDS, PLACEMENT OF WOUND VAC ON EACH SIDE OF LEG;  Surgeon: Budd Palmer, MD;  Location: [facility] OR;  Service: Orthopedics;  Laterality: Right;   IR ANGIOGRAM PULMONARY RIGHT SELECTIVE  04/10/2022   IR ANGIOGRAM SELECTIVE EACH ADDITIONAL VESSEL  04/10/2022   IR NEPHROSTOMY PLACEMENT LEFT  06/19/2016   IR THROMBECT PRIM MECH INIT (INCLU) MOD SED  04/10/2022   IR US GUIDE VASC ACCESS RIGHT  04/10/2022   IVC filter  2009   placed @ [facility]/ Removed in 2010.   IVC Filter Removed     LEFT HEART CATH AND CORONARY ANGIOGRAPHY Right 10/11/2018   Procedure: LEFT HEART CATH AND CORONARY ANGIOGRAPHY;  Surgeon: Laurier Nancy, MD;  Location: [facility] INVASIVE CV LAB;  Service: Cardiovascular;  Laterality: Right;   ligament leg Left    NEPHROLITHOTOMY Left 06/19/2016   Procedure: NEPHROLITHOTOMY PERCUTANEOUS;  Surgeon: Vanna Scotland, MD;  Location: [facility] ORS;  Service: Urology;  Laterality: Left;   ORIF TIBIA FRACTURE Right 12/29/2014   Procedure: OPEN REDUCTION INTERNAL FIXATION (ORIF) RIGHT TIBIA FRACTURE, RIA VS ICBG;  Surgeon: Myrene Galas, MD;  Location: [facility] OR;  Service: Orthopedics;  Laterality: Right;   RADIOACTIVE SEED IMPLANT N/A 09/12/2016   Procedure: RADIOACTIVE SEED IMPLANT/BRACHYTHERAPY IMPLANT;  Surgeon: Vanna Scotland, MD;  Location: [facility] ORS;  Service: Urology;  Laterality: N/A;   RIGHT/LEFT HEART CATH AND CORONARY ANGIOGRAPHY N/A 04/13/2022   Procedure: RIGHT/LEFT HEART CATH AND CORONARY ANGIOGRAPHY;  Surgeon: Dolores Patty, MD;  Location: [facility] INVASIVE CV LAB;  Service: Cardiovascular;  Laterality: N/A;   Social History:  reports that he quit smoking about 10 months ago. His smoking use included cigarettes. He started smoking about 52 years ago. He has a 26 pack-year smoking history. He has never used smokeless tobacco. He reports that he does not drink alcohol and does not use drugs.  Allergies  Allergen Reactions   Adhesive [Tape] Other (See Comments)    After  right leg fracture surgery, pt developed a large blister where tape was applied to his right leg. OK to use paper tape.   Eggs-Apples-Oats [Alitraq] Other (See Comments)    Caused chest pains   Imdur [Isosorbide Dinitrate] Other (See Comments)    hallucinations   Singulair [Montelukast Sodium] Other (See Comments)    Hallucinations     Family History  Problem Relation Age of Onset   Hypertension Mother    Prostate cancer Brother    Mental illness Neg Hx     Prior to Admission medications   Medication Sig Start Date End Date Taking? Authorizing Provider  acetaminophen (TYLENOL) 500 MG tablet Take 2 tablets (1,000 mg total) by mouth every 6 (six) hours as needed for mild pain (pain score 1-3), moderate pain (pain score 4-6), fever or headache. 04/24/23   Champ Mungo, DO  albuterol (VENTOLIN HFA) 108 (90 Base) MCG/ACT inhaler TAKE TWO PUFFS  BY MOUTH AS NEEDED FOR WHEEZING EVERY 4-6 HOURS 11/10/22   Laurier Nancy, MD  ALPRAZolam Prudy Feeler) 0.5 MG tablet Take 1 tablet (0.5 mg total) by mouth 2 (two) times daily as needed for anxiety. 05/02/23   Miki Kins, FNP  amiodarone (PACERONE) 200 MG tablet Take 0.5 tablets (100 mg total) by mouth daily. Patient taking differently: Take 100 mg by mouth in the morning. 08/25/22   Bensimhon, Bevelyn Buckles, MD  apixaban (ELIQUIS) 5 MG TABS tablet TAKE ONE TABLET (5 MG TOTAL) BY MOUTH TWO (TWO) TIMES DAILY. Patient taking differently: Take 5 mg by mouth 2 (two) times daily. 09/26/22   Bensimhon, Bevelyn Buckles, MD  budesonide (PULMICORT) 0.25 MG/2ML nebulizer solution Take 2 mLs (0.25 mg total) by nebulization 2 (two) times daily. 04/24/23   Champ Mungo, DO  cetirizine (ZYRTEC) 10 MG tablet Take 1 tablet (10 mg total) by mouth daily. Patient taking differently: Take 10 mg by mouth in the morning. 05/12/22   [name], NP  cholecalciferol (VITAMIN D3) 25 MCG (1000 UNIT) tablet Take 1,000 Units by mouth in the morning.    [provider]  digoxin (LANOXIN)  0.125 MG tablet TAKE ONE TABLET (0.125 MG TOTAL) BY MOUTH DAILY. Patient taking differently: Take 0.125 mg by mouth in the morning. 01/11/23   Bensimhon, Bevelyn Buckles, MD  Elastic Bandages & Supports (MEDICAL COMPRESSION STOCKINGS) MISC Wear daily as needed 05/18/23   Miki Kins, FNP  FARXIGA 10 MG TABS tablet TAKE ONE TABLET (10 MG TOTAL) BY MOUTH DAILY. Patient taking differently: Take 10 mg by mouth in the morning. 12/15/22   Milford, Anderson Malta, FNP  fluticasone (FLONASE) 50 MCG/ACT nasal spray Place 1 spray into both nostrils daily as needed for allergies. 03/26/23   [provider]  furosemide (LASIX) 20 MG tablet Take 2 tablets (40 mg total) by mouth daily. 05/23/23 05/22/24  Jacklynn Ganong, FNP  gabapentin (NEURONTIN) 100 MG capsule TAKE ONE CAPSULE (100 MG TOTAL) BY MOUTH THREE (THREE) TIMES DAILY. 05/03/23   Miki Kins, FNP  lidocaine (LIDODERM) 5 % Place 1 patch onto the skin daily. Remove & Discard patch within 12 hours or as directed by MD 05/12/22   [name], NP  Multiple Vitamin (MULTIVITAMIN) tablet Take 1 tablet by mouth in the morning. For Men    [provider]  nitroGLYCERIN (NITROSTAT) 0.4 MG SL tablet Place 0.4 mg under the tongue every 5 (five) minutes as needed for chest pain.    [provider]  Omega-3 Fatty Acids (FISH OIL PO) Take 1,000 mg by mouth every evening.    [provider]  omeprazole (PRILOSEC) 40 MG capsule Take 1 capsule (40 mg total) by mouth 2 (two) times daily. 05/02/23   Miki Kins, FNP  pantoprazole (PROTONIX) 40 MG tablet Take 1 tablet (40 mg total) by mouth daily. Patient taking differently: Take 40 mg by mouth in the morning. 01/10/23   Miki Kins, FNP  ranolazine (RANEXA) 500 MG 12 hr tablet TAKE ONE TABLET (500 MG TOTAL) BY MOUTH TWO (TWO) TIMES DAILY. Patient taking differently: Take 500 mg by mouth 2 (two) times daily. 01/11/23   Bensimhon, Bevelyn Buckles, MD  rOPINIRole (REQUIP) 1 MG tablet TAKE  ONE TABLET (1 MG TOTAL) BY MOUTH IN THE MORNING AND AT BEDTIME. 05/23/23   Miki Kins, FNP  rosuvastatin (CRESTOR) 20 MG tablet Take 1 tablet (20 mg total) by mouth daily. 04/24/23   Champ Mungo, DO  sildenafil (REVATIO) 20 MG tablet Take 1 tablet (20 mg total) by mouth 3 (three) times daily. 01/15/23   Milford, Anderson Malta, FNP  Tiotropium Bromide-Olodaterol (STIOLTO RESPIMAT) 2.5-2.5 MCG/ACT AERS Inhale 2 puffs into the lungs daily. 04/24/23 04/23/24  Champ Mungo, DO    Physical Exam: Vitals:   05/24/23 1325 05/24/23 1535 05/24/23 1630 05/24/23 1715  BP:  (!) 142/93 (!) 112/100 117/72  Pulse:  (!) 151 94 89  Resp:  (!) 25  (!) 22  Temp:    98.2 F (36.8 C)  SpO2:  90% 93% 98%  Weight: 72.6 kg     Height: 6' (1.829 m)      Physical Exam:  General: No acute distress, malnourished, gaunt HEENT: Normocephalic, atraumatic, PERRL Cardiovascular: Normal rate and rhythm. Distal pulses intact. Pulmonary: Normal pulmonary effort, diminished breath sounds. No wheezing Gastrointestinal: Nondistended abdomen, soft, non-tender, normoactive bowel sounds Musculoskeletal: Bilateral 3+ lower ext edema, Right ankle tender to palpation Lymphadenopathy: No cervical LAD. Skin: Skin is warm and dry. Neuro: No focal deficits noted, AAOx3. PSYCH: Attentive and cooperative  Data Reviewed:  Results for orders placed or performed during the hospital encounter of 05/24/23 (from the past 24 hours)  Brain natriuretic peptide     Status: Abnormal   Collection Time: 05/24/23  1:16 PM  Result Value Ref Range   B Natriuretic Peptide 2,431.6 (H) 0.0 - 100.0 pg/mL  Digoxin level     Status: Abnormal   Collection Time: 05/24/23  1:16 PM  Result Value Ref Range   Digoxin Level <0.2 (L) 0.8 - 2.0 ng/mL  Basic metabolic panel     Status: Abnormal   Collection Time: 05/24/23  1:32 PM  Result Value Ref Range   Sodium 137 135 - 145 mmol/L   Potassium 4.0 3.5 - 5.1 mmol/L   Chloride 105 98 - 111 mmol/L   CO2  21 (L) 22 - 32 mmol/L   Glucose, Bld 152 (H) 70 - 99 mg/dL   BUN 21 8 - 23 mg/dL   Creatinine, Ser 8.65 0.61 - 1.24 mg/dL   Calcium 9.1 8.9 - 78.4 mg/dL   GFR, Estimated >69 >62 mL/min   Anion gap 11 5 - 15  CBC with Differential/Platelet     Status: Abnormal   Collection Time: 05/24/23  1:32 PM  Result Value Ref Range   WBC 15.6 (H) 4.0 - 10.5 K/uL   RBC 4.07 (L) 4.22 - 5.81 MIL/uL   Hemoglobin 12.7 (L) 13.0 - 17.0 g/dL   HCT 95.2 84.1 - 32.4 %   MCV 98.0 80.0 - 100.0 fL   MCH 31.2 26.0 - 34.0 pg   MCHC 31.8 30.0 - 36.0 g/dL   RDW 40.1 02.7 - 25.3 %   Platelets 373 150 - 400 K/uL   nRBC 0.0 0.0 - 0.2 %   Neutrophils Relative % 91 %   Neutro Abs 14.3 (H) 1.7 - 7.7 K/uL   Lymphocytes Relative 4 %   Lymphs Abs 0.6 (L) 0.7 - 4.0 K/uL   Monocytes Relative 4 %   Monocytes Absolute 0.6 0.1 - 1.0 K/uL   Eosinophils Relative 0 %   Eosinophils Absolute 0.0 0.0 - 0.5 K/uL   Basophils Relative 0 %   Basophils Absolute 0.0 0.0 - 0.1 K/uL   Immature Granulocytes 1 %   Abs Immature Granulocytes 0.08 (H) 0.00 - 0.07 K/uL  Troponin I (High Sensitivity)     Status: Abnormal   Collection Time: 05/24/23  1:32 PM  Result Value Ref Range   Troponin I (High Sensitivity) 721 (HH) <18 ng/L  Troponin I (High Sensitivity)     Status: Abnormal   Collection Time: 05/24/23  3:44 PM  Result Value Ref Range   Troponin I (High Sensitivity) 852 (HH) <18 ng/L  APTT     Status: None   Collection Time: 05/24/23  5:06 PM  Result Value Ref Range   aPTT 34 24 - 36 seconds  Protime-INR     Status: Abnormal   Collection Time: 05/24/23  5:06 PM  Result Value Ref Range   Prothrombin Time 17.8 (H) 11.4 - 15.2 seconds   INR 1.5 (H) 0.8 - 1.2  Lactic acid, plasma     Status: None   Collection Time: 05/24/23  6:33 PM  Result Value Ref Range   Lactic Acid, Venous 1.7 0.5 - 1.9 mmol/L     Assessment and Plan: Right main PE, worse than right PE found last year Eliquis non compliance suspected COPD on 3L [place]  chronically Right pleural effusion  - IV Heparin - Continue O2 as needed - IV Lasix q 12 - Thoracentesis will be considered if diuresis does not help his breathing - Appreciate cardiology and pulmonology recommendations    Advance Care Planning:   Code Status: Do not attempt resuscitation (DNR) - Comfort care the patient names Vanna Scotland as his surrogate decision-maker and he wants to be DNR.  He says he has 2 children who he does not believe care about him.  Consults: Cardiology and pulmonology  Family Communication: none  Severity of Illness: The appropriate patient status for this patient is INPATIENT. Inpatient status is judged to be reasonable and necessary in order to provide the required intensity of service to ensure the patient's safety. The patient's presenting symptoms, physical exam findings, and initial radiographic and laboratory data in the context of their chronic comorbidities is felt to place them at high risk for further clinical deterioration. Furthermore, it is not anticipated that the patient will be medically stable for discharge from the hospital within 2 midnights of admission.   * I certify that at the point of admission it is my clinical judgment that the patient will require inpatient hospital care spanning beyond 2 midnights from the point of admission due to high intensity of service, high risk for further deterioration and high frequency of surveillance required.*  Author: Buena Irish, MD 05/24/2023 8:18 PM  For on call review [email or web address].

## 2023-05-24 NOTE — ED Notes (Signed)
Pt transported to CT with RN on cardiac monitor.

## 2023-05-24 NOTE — Progress Notes (Addendum)
 ADVANCED HF CLINIC NOTE  PCP: [name] NP Radiation Oncology: Dr Rushie Chestnut HF Cardiologist: Dr Gala Romney    HPI:  Bruce [name] is a 78 y.o. with COPD and ongoing tobacco use, CAD, systolic HF EF 40-45%, colon CA previous lung CA, prostate cancer, PE, DVT, and HFrEF.   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 Missed several doses of Xarelto prior to admit. Also had a/c LE DVT.  EF found to be down to 20-25%. RV severely reduced. Cath minimal CAD, moderate to severe PAH with evidence of cor pulmonale (LPAP > RPAP - checked multiple times). Discharged 04/18/22 to SNF. Discharge to home 05/05/22.   Dr. Gala Romney saw him in 4/24 and POCUS showed severe RV dysfunction and LVEF 40% (back to baseline). Suspicion was for severe underlying PAH/cor pulmonale due to end-stage COPD. PFTs and VQ ordered  VQ 5/24: Multiple peripheral perfusion defects in both lungs, indeterminate for recurrent acute pulmonary embolism -> CT scan recommended.  CT chest 5/24: Severe COPD. The component of thrombus in the right main PA has essentially resolved since the prior CTA with a small amount of anterior chronic mural thrombus remaining. Distally, occlusive chronic thrombus remains in the interlobar PA extending into RLL and RML pulmonary arteries. This does not have the appearance of acute thrombus and appears to be chronic obstructive thrombus. At the time of thrombectomy, there was clearly a component of significant chronic thrombus in the interlobar artery  Echo 01/15/23: EF 40-45%, flattened septum with RV failure  Readmitted in 02/25 with acute on chronic respiratory failure and volume overload. He was + for flu A. Diuresis limited by hypotension.  Paramedicine called clinic yesterday with worsening lower extremity edema, hypoxia with O2 sats 82% on 3-4L. Refused ED evaluation. Med compliance questionable. He was instructed to start lasix  40 daily and urgent follow-up arranged.  Here today for follow-up. Accompanied by Paramedic, [name]. He has significantly declined over the last few weeks. Lying in bed most of the day. Has lost 4 lb this week. Little appetite. Short of breath at rest. Sneezed last night and has constant central chest pain since. He is constantly dizzy. States he misses medications on occasion. He admitted to [facility] that he has been throwing pills in the trash. Patient reports he quit smoking but also told Herbert Seta he had been smoking. On last home visit he had cigarette in ash tray on top of oxygen concentrator.     ROS: All systems negative except as listed in HPI, PMH and Problem List.  SH:  Social History   Socioeconomic History   Marital status: Single    Spouse name: Not on file   Number of children: 2   Years of education: Not on file   Highest education level: 6th grade  Occupational History   Not on file  Tobacco Use   Smoking status: Former    Current packs/day: 0.00    Average packs/day: 0.5 packs/day for 52.0 years (26.0 ttl pk-yrs)    Types: Cigarettes    Start date: 07/05/1970    Quit date: 07/05/2022    Years since quitting: 0.8   Smokeless tobacco: Never  Vaping Use   Vaping status: Never Used  Substance and Sexual Activity   Alcohol use: No    Alcohol/week: 0.0 standard drinks of alcohol    Comment: Stopped 2009   Drug use: No   Sexual activity: Not Currently  Other Topics  Concern   Not on file  Social History Narrative   ** Merged History Encounter **       Social Drivers of Health   Financial Resource Strain: High Risk (04/12/2022)   Overall Financial Resource Strain (CARDIA)    Difficulty of Paying Living Expenses: Hard  Food Insecurity: Food Insecurity Present (05/10/2023)   Hunger Vital Sign    Worried About Running Out of Food in the Last Year: Sometimes true    Ran Out of Food in the Last Year: Often true  Transportation Needs: Unmet Transportation Needs  (05/23/2023)   PRAPARE - Administrator, Civil Service (Medical): Yes    Lack of Transportation (Non-Medical): No  Physical Activity: Inactive (02/05/2017)   Exercise Vital Sign    Days of Exercise per Week: 0 days    Minutes of Exercise per Session: 0 min  Stress: Stress Concern Present (02/05/2017)   [employer] of Occupational Health - Occupational Stress Questionnaire    Feeling of Stress : Rather much  Social Connections: Unknown (04/19/2023)   Social Connection and Isolation Panel [NHANES]    Frequency of Communication with Friends and Family: Twice a week    Frequency of Social Gatherings with Friends and Family: Once a week    Attends Religious Services: Patient declined    Active Member of Clubs or Organizations: No    Attends Banker Meetings: Never    Marital Status: Patient declined  Catering manager Violence: Not At Risk (05/10/2023)   Humiliation, Afraid, Rape, and Kick questionnaire    Fear of Current or Ex-Partner: No    Emotionally Abused: No    Physically Abused: No    Sexually Abused: No    FH:  Family History  Problem Relation Age of Onset   Hypertension Mother    Prostate cancer Brother    Mental illness Neg Hx     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 01/01/2015   Complication of anesthesia    wake up with a head ache   COPD (chronic obstructive pulmonary disease) (HCC)    Coronary artery disease    Edema 08/16/2010   GERD (gastroesophageal reflux disease)    Headache    related to sinus congestion   History  of blood transfusion    History of kidney stones    History of MI (myocardial infarction) 10/08/2013   Hypertension    Hypokalemia 04/09/2022   Intracranial bleed (HCC) 12/31/2018   Lactic acidosis 04/09/2022   Leg cramps 08/16/2010   Malignant neoplasm of prostate (HCC) 07/13/2016   Multiple closed facial bone fractures (HCC)    Myocardial infarction [facility])    '09 AND '12   Peripheral vascular disease (HCC)    Shortness of breath    With exertion .   Tibia/fibula fracture 10/06/2013   Tibial plateau fracture 12/29/2014   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HCC) 10/08/2013   UTI (urinary tract infection)    frequent UTI   Visual hallucination 03/18/2015  Current Outpatient Medications  Medication Sig Dispense Refill   acetaminophen (TYLENOL) 500 MG tablet Take 2 tablets (1,000 mg total) by mouth every 6 (six) hours as needed for mild pain (pain score 1-3), moderate pain (pain score 4-6), fever or headache. 30 tablet 0   albuterol (VENTOLIN HFA) 108 (90 Base) MCG/ACT inhaler TAKE TWO PUFFS BY MOUTH AS NEEDED FOR WHEEZING EVERY 4-6 HOURS 8.5 g 5   ALPRAZolam (XANAX) 0.5 MG tablet Take 1 tablet (0.5 mg total) by mouth 2 (two) times daily as needed for anxiety. 60 tablet 1   amiodarone (PACERONE) 200 MG tablet Take 0.5 tablets (100 mg total) by mouth daily. (Patient taking differently: Take 100 mg by mouth in the morning.) 15 tablet 11   apixaban (ELIQUIS) 5 MG TABS tablet TAKE ONE TABLET (5 MG TOTAL) BY MOUTH TWO (TWO) TIMES DAILY. (Patient taking differently: Take 5 mg by mouth 2 (two) times daily.) 60 tablet 11   budesonide (PULMICORT) 0.25 MG/2ML nebulizer solution Take 2 mLs (0.25 mg total) by nebulization 2 (two) times daily. 60 mL 12   cetirizine (ZYRTEC) 10 MG tablet Take 1 tablet (10 mg total) by mouth daily. (Patient taking differently: Take 10 mg by mouth in the morning.) 30 tablet 11   cholecalciferol (VITAMIN D3) 25 MCG (1000 UNIT) tablet Take 1,000 Units by mouth in the  morning.     digoxin (LANOXIN) 0.125 MG tablet TAKE ONE TABLET (0.125 MG TOTAL) BY MOUTH DAILY. (Patient taking differently: Take 0.125 mg by mouth in the morning.) 30 tablet 6   Elastic Bandages & Supports (MEDICAL COMPRESSION STOCKINGS) MISC Wear daily as needed 2 each 0   FARXIGA 10 MG TABS tablet TAKE ONE TABLET (10 MG TOTAL) BY MOUTH DAILY. (Patient taking differently: Take 10 mg by mouth in the morning.) 90 tablet 1   fluticasone (FLONASE) 50 MCG/ACT nasal spray Place 1 spray into both nostrils daily as needed for allergies.     furosemide (LASIX) 20 MG tablet Take 2 tablets (40 mg total) by mouth daily. 60 tablet 11   gabapentin (NEURONTIN) 100 MG capsule TAKE ONE CAPSULE (100 MG TOTAL) BY MOUTH THREE (THREE) TIMES DAILY. 90 capsule 1   lidocaine (LIDODERM) 5 % Place 1 patch onto the skin daily. Remove & Discard patch within 12 hours or as directed by MD 30 patch 11   Multiple Vitamin (MULTIVITAMIN) tablet Take 1 tablet by mouth in the morning. For Men     nitroGLYCERIN (NITROSTAT) 0.4 MG SL tablet Place 0.4 mg under the tongue every 5 (five) minutes as needed for chest pain.     Omega-3 Fatty Acids (FISH OIL PO) Take 1,000 mg by mouth every evening.     omeprazole (PRILOSEC) 40 MG capsule Take 1 capsule (40 mg total) by mouth 2 (two) times daily. 180 capsule 1   pantoprazole (PROTONIX) 40 MG tablet Take 1 tablet (40 mg total) by mouth daily. (Patient taking differently: Take 40 mg by mouth in the morning.) 90 tablet 3   ranolazine (RANEXA) 500 MG 12 hr tablet TAKE ONE TABLET (500 MG TOTAL) BY MOUTH TWO (TWO) TIMES DAILY. (Patient taking differently: Take 500 mg by mouth 2 (two) times daily.) 60 tablet 6   rOPINIRole (REQUIP) 1 MG tablet TAKE ONE TABLET (1 MG TOTAL) BY MOUTH IN THE MORNING AND AT BEDTIME. 60 tablet 3   rosuvastatin (CRESTOR) 20 MG tablet Take 1 tablet (20 mg total) by mouth daily. 30 tablet 2   sildenafil (REVATIO) 20 MG  tablet Take 1 tablet (20 mg total) by mouth 3 (three)  times daily. 90 tablet 5   Tiotropium Bromide-Olodaterol (STIOLTO RESPIMAT) 2.5-2.5 MCG/ACT AERS Inhale 2 puffs into the lungs daily. 4 g 12   No current facility-administered medications for this encounter.   Facility-Administered Medications Ordered in Other Encounters  Medication Dose Route Frequency Provider Last Rate Last Admin   albuterol (PROVENTIL) (2.5 MG/3ML) 0.083% nebulizer solution 2.5 mg  2.5 mg Nebulization Once Raechel Chute, MD       BP 132/72   Pulse 100   Ht 6' (1.829 m)   Wt 72.8 kg (160 lb 9.6 oz)   SpO2 (!) 85%   BMI 21.78 kg/m   Wt Readings from Last 3 Encounters:  05/24/23 72.8 kg (160 lb 9.6 oz)  05/23/23 70.4 kg (155 lb 3.2 oz)  05/16/23 72.1 kg (159 lb)   PHYSICAL EXAM: General: Ill appearing. Cachectic.  Neck: JVP 10-12.  Cor: PMI nondisplaced. Regular rate & rhythm. No rubs, gallops or murmurs. Lungs: Diminished. Dyspneic at rest with use of accessory muscles. Abdomen: soft, nontender, nondistended.  Extremities: 3+ edema to knees Neuro: alert & orientedx3. Affect pleasant   ASSESSMENT & PLAN: 1. Acute on Chronic Hypoxic Respiratory Failure - COPD at b/l + submassive PE. Ongoing tobacco use. - Chronically on 3-4L O2 [place], sat 82% on home visit yesterday at 3.5L. 85% in clinic today.  - Dyspneic at rest with use of accessory muscles.  - Likely at least in part d/t volume overload. He has been noncompliant with medications, paramedicine indicates he has been throwing them in the trash - Sent to ED for further workup/management  2. 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  - VQ 5/24: Multiple peripheral perfusion defects in both lungs, indeterminate for recurrent acute pulmonary embolism -> CT scan recommended - CT chest 5/24: Severe COPD. The component of thrombus in the right main  PA has essentially resolved since the prior CTA with a small amount of anterior chronic mural thrombus remaining. Distally, occlusive chronic thrombus remains in the interlobar PA extending into RLL and RML pulmonary arteries - No role for thrombectomy - Prescribed sildenafil 20 TID. Noncompliant. - Continue Eliquis 5 bid, not compliant   3. PAF  - NSR on ECG today - Continue amio 100 mg daily.  Not ideal long-term with COPD.  - TSH consistently around 6, last checked 12/24. Due for repeat labs. - Continue Eliquis 5 mg bid.   4. 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  - R/LHC (2/24): c/w severe NICM. Minimal CAD, moderate to severe PAH with evidence of cor pulmonale - POCUS (06/21/22): EF 40%, RV remains dilated and reduced.  - Echo 11/24: EF 40-45%, RV moderately reduced, septum flattened - NYHA IV. Volume overloaded. Not compliant with diuretic or other medications. See discussion in #1. - Continue Farxiga 10 mg daily. - Off losartan d/t hypotension - Continue digoxin 0.125 mg daily. - Off spiro due to elevated K.  - Continue with Paramedicine. Appreciate assistance. - Continue to follow with [facility]  - Appears end-stage.   5. Chest pain - Constant since sneezing episode yesterday - Symptoms atypical for angina. Etiology not clear. - I did not see any ischemic  changes on ECG but interpretation limited by artifact - Sent to ED for evaluation d/t progressive dyspnea and hypoxia  6. Tobacco abuse - Smoking cessation discussed  7. Prostate Cancer - Treated with seeds   Follow-up: Pending hospitalization  Alan Drummer N PA-C 11:01 AM

## 2023-05-24 NOTE — Consult Note (Signed)
 NAME:  Bruce [name], MRN:  [id number], DOB:  11-19-1945, LOS: 0 ADMISSION DATE:  05/24/2023, CONSULTATION DATE:  05/24/2023  REFERRING MD:  Letitia Caul, CHIEF COMPLAINT: Shortness of breath  History of Present Illness:  78 year old man with severe COPD, chronic respiratory failure on 3 L of oxygen, CTEPH was evaluated in advanced heart failure clinic today and reported worsening dyspnea on exertion and rest and generalized weakness, noted to be hypoxic and sent to the ED for further evaluation. Of note he has a paramedic visit him once a week and layout his pillbox, CHF note states that paramedic mentioned that he has been throwing medications the trash.  Patient denies this but also does not know that he is on a blood thinner medication. ED labs showed troponin 721, BNP 2431, chest x-ray with a right loculated right pleural effusion. CT angiogram chest showed large pulmonary artery embolus involving right main pulmonary artery extending to right middle and right lower branches.  Resolution of left lower lobe pulmonary artery emboli, large right effusion.   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 EF 40 to 45% with moderately reduced RV function and pulm hypertension 04/2022 nonobstructive CAD, normal LVEDP, severe pulmonary hypertension 85/24 Submassive PE 04/2022 requiring IR thrombectomy, subsequent CT imaging and VQ scan consistent with CTEPH , felt not to be a candidate for thrombectomy Prostate cancer Chronic cor pulmonale   Significant Hospital Events: Including procedures, antibiotic start and stop dates in addition to other pertinent events     Interim History / Subjective:  Complains of dyspnea Complains of leg swelling "comes and goes"  Objective   Blood pressure 117/72, pulse 89, temperature 98.2 F (36.8 C), resp. rate (!) 22, height 6' (1.829 m), weight 72.6 kg, SpO2 98%.       No intake or output data in the 24 hours ending 05/24/23 1806 Filed Weights    05/24/23 1325  Weight: 72.6 kg    Examination: General: Chronically ill-appearing man, no distress, on 3 L nasal cannula HENT: Mild pallor, no icterus, flat neck veins Lungs: Decreased breath sounds bilateral, no accessory muscle use Cardiovascular: S1-S2 regular, no murmur Abdomen: Soft, nontender no organomegaly Extremities: 2+ edema on left, right leg tender 1+ edema Neuro: Alert, interactive, nonfocal, no asterixis  Labs show normal electrolytes, troponin 721/852, BNP 2431, mild leukocytosis   Resolved Hospital Problem list     Assessment & Plan:  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, right main pulmonary arterial thrombus appears worse than prior CT scan to my review (although radiologist notes same as prior) , previously was only mural thrombus 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 Noncompliance with anticoagulation  -IV heparin bolus and infusion  Acute on chronic respiratory failure with hypoxia Underlying severe COPD -Appears to be at baseline 3 L -Triple therapy via nebs during hospitalization  Severe PH -group 3/4 CTEPH -chronic PE and LLE DVT  -Not compliant with Eliquis and sildenafil  Right pleural effusion -noncompliant with diuretic -Trial of IV diuresis, if no better proceed with thoracentesis  Acute on chronic biventricular heart failure -poor compliance with meds Paroxysmal atrial fibrillation  -Was on amiodarone and digoxin, cardiology following  Palliative care consultation would be appropriate given his noncompliance   Best Practice (right click and "Reselect all SmartList Selections" daily)   Diet/type: Regular consistency (see orders) DVT prophylaxis systemic heparin Pressure ulcer(s): N/A GI prophylaxis: N/A Lines: N/A Foley:  N/A Code Status:  full code Last date of multidisciplinary goals of care discussion [NA]  Labs   CBC: Recent Labs  Lab 05/24/23 1332  WBC 15.6*  NEUTROABS 14.3*  HGB 12.7*  HCT 39.9  MCV 98.0  PLT 373     Basic Metabolic Panel: Recent Labs  Lab 05/24/23 1332  NA 137  K 4.0  CL 105  CO2 21*  GLUCOSE 152*  BUN 21  CREATININE 0.92  CALCIUM 9.1   GFR: Estimated Creatinine Clearance: 71.2 mL/min (by C-G formula based on SCr of 0.92 mg/dL). Recent Labs  Lab 05/24/23 1332  WBC 15.6*    Liver Function Tests: No results for input(s): "AST", "ALT", "ALKPHOS", "BILITOT", "PROT", "ALBUMIN" in the last 168 hours. No results for input(s): "LIPASE", "AMYLASE" in the last 168 hours. No results for input(s): "AMMONIA" in the last 168 hours.  ABG    Component Value Date/Time   PHART 7.434 04/13/2022 1118   PCO2ART 26.5 (L) 04/13/2022 1118   PO2ART 56 (L) 04/13/2022 1118   HCO3 24.6 04/18/2023 1826   TCO2 24 04/13/2022 1125   TCO2 25 04/13/2022 1125   ACIDBASEDEF 1.0 04/13/2022 1125   O2SAT 59.6 04/18/2023 1826     Coagulation Profile: No results for input(s): "INR", "PROTIME" in the last 168 hours.  Cardiac Enzymes: No results for input(s): "CKTOTAL", "CKMB", "CKMBINDEX", "TROPONINI" in the last 168 hours.  HbA1C: Hgb A1c MFr Bld  Date/Time Value Ref Range Status  02/05/2023 09:51 AM 5.6 4.8 - 5.6 % Final    Comment:             Prediabetes: 5.7 - 6.4          Diabetes: >6.4          Glycemic control for adults with diabetes: <7.0   11/02/2022 02:06 PM 5.7 (H) 4.8 - 5.6 % Final    Comment:             Prediabetes: 5.7 - 6.4          Diabetes: >6.4          Glycemic control for adults with diabetes: <7.0     CBG: No results for input(s): "GLUCAP" in the last 168 hours.  Review of Systems:    Positive for orthopnea, NYHA IV dyspnea Leg swelling Generalized weakness  Constitutional: negative for anorexia, fevers and sweats  Eyes: negative for irritation, redness and visual disturbance  Ears, nose, mouth, throat, and face: negative for earaches, epistaxis, nasal congestion and sore throat  Respiratory: negative for cough,  sputum and wheezing  Cardiovascular:  negative for chest pain, palpitations and syncope  Gastrointestinal: negative for abdominal pain, constipation, diarrhea, melena, nausea and vomiting  Genitourinary:negative for dysuria, frequency and hematuria  Hematologic/lymphatic: negative for bleeding, easy bruising and lymphadenopathy  Musculoskeletal:negative for arthralgias, muscle weakness and stiff joints  Neurological: negative for coordination problems, gait problems, headaches and weakness  Endocrine: negative for diabetic symptoms including polydipsia, polyuria and weight loss   Past Medical History:  He,  has a past medical history of Acute respiratory failure with hypoxia (HCC) (04/09/2022), AKI (acute kidney injury) (HCC), Anginal pain (HCC), Anxiety, Arthritis, Auditory hallucination (03/18/2015), Blunt trauma of lower leg (10/08/2013), Cancer (HCC), CHF (congestive heart failure) (HCC), 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 (01/01/2015), Complication of anesthesia, COPD (chronic obstructive pulmonary disease) (HCC), Coronary artery disease, Edema (08/16/2010), GERD (gastroesophageal reflux disease), Headache, History of blood transfusion, History of kidney stones, History of MI (myocardial infarction) (10/08/2013), Hypertension, Hypokalemia (04/09/2022), Intracranial bleed (HCC) (12/31/2018), Lactic acidosis (04/09/2022), Leg cramps (  08/16/2010), Malignant neoplasm of prostate (HCC) (07/13/2016), Multiple closed facial bone fractures (HCC), Myocardial infarction [facility]), Peripheral vascular disease (HCC), Shortness of breath, Tibia/fibula fracture (10/06/2013), Tibial plateau fracture (12/29/2014), 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HCC) (10/08/2013), UTI (urinary tract infection), and Visual hallucination (03/18/2015).   Surgical History:   Past Surgical History:  Procedure Laterality Date   CARDIAC CATHETERIZATION     CHOLECYSTECTOMY     EXTERNAL FIXATION LEG Right 10/06/2013   Procedure: CLOSED  REDUCTION RIGHT TIBIAL PLATEAU FRACTURE, EXTERNAL FIXATION RIGHT LEG, PLACEMENT OF WOUND VAC;  Surgeon: Budd Palmer, MD;  Location: [facility] OR;  Service: Orthopedics;  Laterality: Right;   FEMORAL-POPLITEAL BYPASS GRAFT Right 10/06/2013   Procedure: RIGHT POPLITEAL-POPLITEAL ARTERY BYPASS GRAFT;  Surgeon: Larina Earthly, MD;  Location: [facility] OR;  Service: Vascular;  Laterality: Right;   FRACTURE SURGERY Right 2014   HARDWARE REMOVAL Right 12/02/2013   Procedure: REMOVAL EXTERNAL FIXATION RIGHT LEG ;  Surgeon: Budd Palmer, MD;  Location: [facility] OR;  Service: Orthopedics;  Laterality: Right;   HERNIA REPAIR Right 1990's   I & D EXTREMITY Right 10/09/2013   Procedure: IRRIGATION AND DEBRIDEMENT RIGHT LEG, CLOSURE  OF WOUNDS, PLACEMENT OF WOUND VAC ON EACH SIDE OF LEG;  Surgeon: Budd Palmer, MD;  Location: [facility] OR;  Service: Orthopedics;  Laterality: Right;   IR ANGIOGRAM PULMONARY RIGHT SELECTIVE  04/10/2022   IR ANGIOGRAM SELECTIVE EACH ADDITIONAL VESSEL  04/10/2022   IR NEPHROSTOMY PLACEMENT LEFT  06/19/2016   IR THROMBECT PRIM MECH INIT (INCLU) MOD SED  04/10/2022   IR US GUIDE VASC ACCESS RIGHT  04/10/2022   IVC filter  2009   placed @ [facility]/ Removed in 2010.   IVC Filter Removed     LEFT HEART CATH AND CORONARY ANGIOGRAPHY Right 10/11/2018   Procedure: LEFT HEART CATH AND CORONARY ANGIOGRAPHY;  Surgeon: Laurier Nancy, MD;  Location: [facility] INVASIVE CV LAB;  Service: Cardiovascular;  Laterality: Right;   ligament leg Left    NEPHROLITHOTOMY Left 06/19/2016   Procedure: NEPHROLITHOTOMY PERCUTANEOUS;  Surgeon: Vanna Scotland, MD;  Location: [facility] ORS;  Service: Urology;  Laterality: Left;   ORIF TIBIA FRACTURE Right 12/29/2014   Procedure: OPEN REDUCTION INTERNAL FIXATION (ORIF) RIGHT TIBIA FRACTURE, RIA VS ICBG;  Surgeon: Myrene Galas, MD;  Location: [facility] OR;  Service: Orthopedics;  Laterality: Right;   RADIOACTIVE SEED IMPLANT N/A 09/12/2016   Procedure: RADIOACTIVE SEED IMPLANT/BRACHYTHERAPY IMPLANT;  Surgeon:  Vanna Scotland, MD;  Location: [facility] ORS;  Service: Urology;  Laterality: N/A;   RIGHT/LEFT HEART CATH AND CORONARY ANGIOGRAPHY N/A 04/13/2022   Procedure: RIGHT/LEFT HEART CATH AND CORONARY ANGIOGRAPHY;  Surgeon: Dolores Patty, MD;  Location: [facility];  Service: Cardiovascular;  Laterality: N/A;     Social History:   reports that he quit smoking about 10 months ago. His smoking use included cigarettes. He started smoking about 52 years ago. He has a 26 pack-year smoking history. He has never used smokeless tobacco. He reports that he does not drink alcohol and does not use drugs.   Family History:  His family history includes Hypertension in his mother; Prostate cancer in his brother. There is no history of Mental illness.   Allergies Allergies  Allergen Reactions   Adhesive [Tape] Other (See Comments)    After right leg fracture surgery, pt developed a large blister where tape was applied to his right leg. OK to use paper tape.   Eggs-Apples-Oats [Alitraq] Other (See Comments)    Caused chest  pains   Imdur [Isosorbide Dinitrate] Other (See Comments)    hallucinations   Singulair [Montelukast Sodium] Other (See Comments)    Hallucinations      Home Medications  Prior to Admission medications   Medication Sig Start Date End Date Taking? Authorizing Provider  acetaminophen (TYLENOL) 500 MG tablet Take 2 tablets (1,000 mg total) by mouth every 6 (six) hours as needed for mild pain (pain score 1-3), moderate pain (pain score 4-6), fever or headache. 04/24/23   Champ Mungo, DO  albuterol (VENTOLIN HFA) 108 (90 Base) MCG/ACT inhaler TAKE TWO PUFFS BY MOUTH AS NEEDED FOR WHEEZING EVERY 4-6 HOURS 11/10/22   Laurier Nancy, MD  ALPRAZolam Prudy Feeler) 0.5 MG tablet Take 1 tablet (0.5 mg total) by mouth 2 (two) times daily as needed for anxiety. 05/02/23   Miki Kins, FNP  amiodarone (PACERONE) 200 MG tablet Take 0.5 tablets (100 mg total) by mouth daily. Patient taking  differently: Take 100 mg by mouth in the morning. 08/25/22   Bensimhon, Bevelyn Buckles, MD  apixaban (ELIQUIS) 5 MG TABS tablet TAKE ONE TABLET (5 MG TOTAL) BY MOUTH TWO (TWO) TIMES DAILY. Patient taking differently: Take 5 mg by mouth 2 (two) times daily. 09/26/22   Bensimhon, Bevelyn Buckles, MD  budesonide (PULMICORT) 0.25 MG/2ML nebulizer solution Take 2 mLs (0.25 mg total) by nebulization 2 (two) times daily. 04/24/23   Champ Mungo, DO  cetirizine (ZYRTEC) 10 MG tablet Take 1 tablet (10 mg total) by mouth daily. Patient taking differently: Take 10 mg by mouth in the morning. 05/12/22   [name], NP  cholecalciferol (VITAMIN D3) 25 MCG (1000 UNIT) tablet Take 1,000 Units by mouth in the morning.    [provider]  digoxin (LANOXIN) 0.125 MG tablet TAKE ONE TABLET (0.125 MG TOTAL) BY MOUTH DAILY. Patient taking differently: Take 0.125 mg by mouth in the morning. 01/11/23   Bensimhon, Bevelyn Buckles, MD  Elastic Bandages & Supports (MEDICAL COMPRESSION STOCKINGS) MISC Wear daily as needed 05/18/23   Miki Kins, FNP  FARXIGA 10 MG TABS tablet TAKE ONE TABLET (10 MG TOTAL) BY MOUTH DAILY. Patient taking differently: Take 10 mg by mouth in the morning. 12/15/22   Milford, Anderson Malta, FNP  fluticasone (FLONASE) 50 MCG/ACT nasal spray Place 1 spray into both nostrils daily as needed for allergies. 03/26/23   [provider]  furosemide (LASIX) 20 MG tablet Take 2 tablets (40 mg total) by mouth daily. 05/23/23 05/22/24  Jacklynn Ganong, FNP  gabapentin (NEURONTIN) 100 MG capsule TAKE ONE CAPSULE (100 MG TOTAL) BY MOUTH THREE (THREE) TIMES DAILY. 05/03/23   Miki Kins, FNP  lidocaine (LIDODERM) 5 % Place 1 patch onto the skin daily. Remove & Discard patch within 12 hours or as directed by MD 05/12/22   [name], NP  Multiple Vitamin (MULTIVITAMIN) tablet Take 1 tablet by mouth in the morning. For Men    [provider]  nitroGLYCERIN (NITROSTAT) 0.4 MG SL tablet Place 0.4 mg  under the tongue every 5 (five) minutes as needed for chest pain.    [provider]  Omega-3 Fatty Acids (FISH OIL PO) Take 1,000 mg by mouth every evening.    [provider]  omeprazole (PRILOSEC) 40 MG capsule Take 1 capsule (40 mg total) by mouth 2 (two) times daily. 05/02/23   Miki Kins, FNP  pantoprazole (PROTONIX) 40 MG tablet Take 1 tablet (40 mg total) by mouth daily. Patient taking differently: Take 40 mg  by mouth in the morning. 01/10/23   Miki Kins, FNP  ranolazine (RANEXA) 500 MG 12 hr tablet TAKE ONE TABLET (500 MG TOTAL) BY MOUTH TWO (TWO) TIMES DAILY. Patient taking differently: Take 500 mg by mouth 2 (two) times daily. 01/11/23   Bensimhon, Bevelyn Buckles, MD  rOPINIRole (REQUIP) 1 MG tablet TAKE ONE TABLET (1 MG TOTAL) BY MOUTH IN THE MORNING AND AT BEDTIME. 05/23/23   Miki Kins, FNP  rosuvastatin (CRESTOR) 20 MG tablet Take 1 tablet (20 mg total) by mouth daily. 04/24/23   Champ Mungo, DO  sildenafil (REVATIO) 20 MG tablet Take 1 tablet (20 mg total) by mouth 3 (three) times daily. 01/15/23   Milford, Anderson Malta, FNP  Tiotropium Bromide-Olodaterol (STIOLTO RESPIMAT) 2.5-2.5 MCG/ACT AERS Inhale 2 puffs into the lungs daily. 04/24/23 04/23/24  Champ Mungo, DO        Cyril Mourning MD. FCCP. [facility] Pager : [phone]  If no response to pager , please call [phone] until 7 pm After 7:00 pm call Elink  [phone]   05/24/2023

## 2023-05-25 ENCOUNTER — Inpatient Hospital Stay (HOSPITAL_COMMUNITY)

## 2023-05-25 DIAGNOSIS — I2609 Other pulmonary embolism with acute cor pulmonale: Secondary | ICD-10-CM

## 2023-05-25 DIAGNOSIS — J90 Pleural effusion, not elsewhere classified: Secondary | ICD-10-CM | POA: Diagnosis not present

## 2023-05-25 DIAGNOSIS — Z515 Encounter for palliative care: Secondary | ICD-10-CM | POA: Diagnosis not present

## 2023-05-25 DIAGNOSIS — Z7189 Other specified counseling: Secondary | ICD-10-CM

## 2023-05-25 DIAGNOSIS — I5023 Acute on chronic systolic (congestive) heart failure: Secondary | ICD-10-CM

## 2023-05-25 DIAGNOSIS — M7989 Other specified soft tissue disorders: Secondary | ICD-10-CM | POA: Diagnosis not present

## 2023-05-25 DIAGNOSIS — J441 Chronic obstructive pulmonary disease with (acute) exacerbation: Secondary | ICD-10-CM | POA: Diagnosis not present

## 2023-05-25 DIAGNOSIS — I2699 Other pulmonary embolism without acute cor pulmonale: Secondary | ICD-10-CM | POA: Diagnosis not present

## 2023-05-25 DIAGNOSIS — J9621 Acute and chronic respiratory failure with hypoxia: Secondary | ICD-10-CM | POA: Diagnosis not present

## 2023-05-25 LAB — ECHOCARDIOGRAM COMPLETE
AR max vel: 2.94 cm2
AV Peak grad: 4.2 mmHg
Ao pk vel: 1.03 m/s
Area-P 1/2: 4.31 cm2
Height: 73 in
S' Lateral: 4.5 cm
Weight: 2363.33 [oz_av]

## 2023-05-25 LAB — CBC
HCT: 33 % — ABNORMAL LOW (ref 39.0–52.0)
Hemoglobin: 10.6 g/dL — ABNORMAL LOW (ref 13.0–17.0)
MCH: 30.4 pg (ref 26.0–34.0)
MCHC: 32.1 g/dL (ref 30.0–36.0)
MCV: 94.6 fL (ref 80.0–100.0)
Platelets: 335 10*3/uL (ref 150–400)
RBC: 3.49 MIL/uL — ABNORMAL LOW (ref 4.22–5.81)
RDW: 15.1 % (ref 11.5–15.5)
WBC: 12 10*3/uL — ABNORMAL HIGH (ref 4.0–10.5)
nRBC: 0 % (ref 0.0–0.2)

## 2023-05-25 LAB — HEPARIN LEVEL (UNFRACTIONATED)
Heparin Unfractionated: <0.1 [IU]/mL — ABNORMAL LOW (ref 0.30–0.70)
Heparin Unfractionated: 0.18 [IU]/mL — ABNORMAL LOW (ref 0.30–0.70)
Heparin Unfractionated: 0.3 [IU]/mL (ref 0.30–0.70)

## 2023-05-25 LAB — BASIC METABOLIC PANEL
Anion gap: 10 (ref 5–15)
BUN: 18 mg/dL (ref 8–23)
CO2: 22 mmol/L (ref 22–32)
Calcium: 8.4 mg/dL — ABNORMAL LOW (ref 8.9–10.3)
Chloride: 103 mmol/L (ref 98–111)
Creatinine, Ser: 0.89 mg/dL (ref 0.61–1.24)
GFR, Estimated: >60 mL/min (ref >60)
Glucose, Bld: 102 mg/dL — ABNORMAL HIGH (ref 70–99)
Potassium: 3.5 mmol/L (ref 3.5–5.1)
Sodium: 135 mmol/L (ref 135–145)

## 2023-05-25 LAB — APTT: aPTT: 36 s (ref 24–36)

## 2023-05-25 MED ORDER — SILDENAFIL CITRATE 20 MG PO TABS
20.0000 mg | ORAL_TABLET | Freq: Three times a day (TID) | ORAL | Status: DC
  Administered 2023-05-25 – 2023-06-06 (×37): 20 mg via ORAL
  Filled 2023-05-25 (×39): qty 1

## 2023-05-25 MED ORDER — HEPARIN BOLUS VIA INFUSION
2000.0000 [IU] | Freq: Once | INTRAVENOUS | Status: AC
  Administered 2023-05-25: 2000 [IU] via INTRAVENOUS
  Filled 2023-05-25: qty 2000

## 2023-05-25 MED ORDER — CARVEDILOL 3.125 MG PO TABS
3.1250 mg | ORAL_TABLET | Freq: Two times a day (BID) | ORAL | Status: DC
Start: 2023-05-25 — End: 2023-06-06
  Administered 2023-05-25 – 2023-06-06 (×22): 3.125 mg via ORAL
  Filled 2023-05-25 (×24): qty 1

## 2023-05-25 MED ORDER — SODIUM CHLORIDE 3 % IN NEBU
4.0000 mL | INHALATION_SOLUTION | Freq: Two times a day (BID) | RESPIRATORY_TRACT | Status: AC
  Administered 2023-05-25 – 2023-05-27 (×4): 4 mL via RESPIRATORY_TRACT
  Filled 2023-05-25 (×8): qty 4

## 2023-05-25 MED ORDER — BUDESONIDE 0.5 MG/2ML IN SUSP
0.5000 mg | Freq: Two times a day (BID) | RESPIRATORY_TRACT | Status: DC
  Administered 2023-05-25 – 2023-06-06 (×21): 0.5 mg via RESPIRATORY_TRACT
  Filled 2023-05-25 (×24): qty 2

## 2023-05-25 MED ORDER — LOSARTAN POTASSIUM 25 MG PO TABS
25.0000 mg | ORAL_TABLET | Freq: Every day | ORAL | Status: DC
  Administered 2023-05-25 – 2023-06-02 (×9): 25 mg via ORAL
  Filled 2023-05-25 (×9): qty 1

## 2023-05-25 MED ORDER — PREDNISONE 20 MG PO TABS
40.0000 mg | ORAL_TABLET | Freq: Every day | ORAL | Status: AC
Start: 2023-05-26 — End: 2023-05-30
  Administered 2023-05-26 – 2023-05-30 (×5): 40 mg via ORAL
  Filled 2023-05-25 (×5): qty 2

## 2023-05-25 MED ORDER — PERFLUTREN LIPID MICROSPHERE
1.0000 mL | INTRAVENOUS | Status: AC | PRN
  Administered 2023-05-25: 4 mL via INTRAVENOUS

## 2023-05-25 MED ORDER — IPRATROPIUM-ALBUTEROL 0.5-2.5 (3) MG/3ML IN SOLN
3.0000 mL | Freq: Four times a day (QID) | RESPIRATORY_TRACT | Status: DC
  Administered 2023-05-25 – 2023-05-28 (×12): 3 mL via RESPIRATORY_TRACT
  Filled 2023-05-25 (×12): qty 3

## 2023-05-25 MED ORDER — DOXYCYCLINE HYCLATE 100 MG PO TABS
100.0000 mg | ORAL_TABLET | Freq: Two times a day (BID) | ORAL | Status: AC
  Administered 2023-05-25 – 2023-06-01 (×14): 100 mg via ORAL
  Filled 2023-05-25 (×14): qty 1

## 2023-05-25 NOTE — Progress Notes (Signed)
 PHARMACY - ANTICOAGULATION Pharmacy Consult for heparin Indication: pulmonary embolus  Allergies  Allergen Reactions   Adhesive [Tape] Other (See Comments)    After right leg fracture surgery, pt developed a large blister where tape was applied to his right leg. OK to use paper tape.   Eggs-Apples-Oats [Alitraq] Other (See Comments)    Caused chest pains   Imdur [Isosorbide Dinitrate] Other (See Comments)    hallucinations   Singulair [Montelukast Sodium] Other (See Comments)    Hallucinations     Patient Measurements: Height: 6\' 1"  (185.4 cm) Weight: 67 kg (147 lb 11.3 oz) IBW/kg (Calculated) : 79.9 Heparin Dosing Weight: 72.6 kg   Vital Signs: Temp: 97.9 F (36.6 C) (03/21 1927) Temp Source: Oral (03/21 1927) BP: 96/64 (03/21 1927) Pulse Rate: 93 (03/21 1927)  Labs: Recent Labs    05/24/23 1332 05/24/23 1544 05/24/23 1706 05/25/23 0206 05/25/23 0348 05/25/23 1153 05/25/23 2206  HGB 12.7*  --   --   --  10.6*  --   --   HCT 39.9  --   --   --  33.0*  --   --   PLT 373  --   --   --  335  --   --   APTT  --   --  34 36  --   --   --   LABPROT  --   --  17.8*  --   --   --   --   INR  --   --  1.5*  --   --   --   --   HEPARINUNFRC  --   --   --  <0.10*  --  0.18* 0.30  CREATININE 0.92  --   --   --  0.89  --   --   TROPONINIHS 721* 852*  --   --   --   --   --     Estimated Creatinine Clearance: 68 mL/min (by C-G formula based on SCr of 0.89 mg/dL).   Assessment: 78 y.o. male with acute on chronic PE in setting of noncompliance with Eliquis for heparin  3/21 PM update:  Heparin level low end therapeutic is setting of PE Will aim more toward middle of goal  Goal of Therapy:  Heparin level 0.3-0.7 units/mL Monitor platelets by anticoagulation protocol: Yes   Plan:  Inc heparin to 1850 units/hr Heparin level with AM labs  Abran Duke, PharmD, BCPS Clinical Pharmacist Phone: [phone]

## 2023-05-25 NOTE — Progress Notes (Signed)
 PHARMACY - ANTICOAGULATION Pharmacy Consult for heparin Indication: pulmonary embolus Brief A/P: Heparin level subtherapeutic Increase Heparin rate  Allergies  Allergen Reactions   Adhesive [Tape] Other (See Comments)    After right leg fracture surgery, pt developed a large blister where tape was applied to his right leg. OK to use paper tape.   Eggs-Apples-Oats [Alitraq] Other (See Comments)    Caused chest pains   Imdur [Isosorbide Dinitrate] Other (See Comments)    hallucinations   Singulair [Montelukast Sodium] Other (See Comments)    Hallucinations     Patient Measurements: Height: 6' (182.9 cm) Weight: 72.6 kg (160 lb) IBW/kg (Calculated) : 77.6 Heparin Dosing Weight: 72.6 kg   Vital Signs: Temp: 97.7 F (36.5 C) (03/21 1029) Temp Source: Axillary (03/21 1029) BP: 98/72 (03/21 1200) Pulse Rate: 77 (03/21 1200)  Labs: Recent Labs    05/24/23 1332 05/24/23 1544 05/24/23 1706 05/25/23 0206 05/25/23 0348 05/25/23 1153  HGB 12.7*  --   --   --  10.6*  --   HCT 39.9  --   --   --  33.0*  --   PLT 373  --   --   --  335  --   APTT  --   --  34 36  --   --   LABPROT  --   --  17.8*  --   --   --   INR  --   --  1.5*  --   --   --   HEPARINUNFRC  --   --   --  <0.10*  --  0.18*  CREATININE 0.92  --   --   --  0.89  --   TROPONINIHS 721* 852*  --   --   --   --     Estimated Creatinine Clearance: 73.6 mL/min (by C-G formula based on SCr of 0.89 mg/dL).   Assessment: 78 y.o. male with acute on chronic PE in setting of noncompliance with Eliquis for heparin  Heparin level below goal 0.18 on 1500 units/hr, no issues with infusion or overt s/sx of bleeding documented.   Goal of Therapy:  Heparin level 0.3-0.7 units/mL Monitor platelets by anticoagulation protocol: Yes   Plan:  Heparin 2000 units IV bolus, then increase heparin 1750 units/hr Check heparin level in 8 hours.   Ruben Im, PharmD Clinical Pharmacist 05/25/2023 12:46 PM Please check AMION  for all [facility] Pharmacy numbers

## 2023-05-25 NOTE — Progress Notes (Signed)
 Cardiologist:  Turner/Bensimohn  Subjective:   Breathing better. Discussed need for compliance especially with blood thinner   Objective:  Vitals:   05/25/23 0345 05/25/23 0400 05/25/23 0612 05/25/23 0630  BP: 112/82 107/83  119/82  Pulse: 73 78  79  Resp: (!) 24 (!) 26  (!) 28  Temp:   97.7 F (36.5 C)   TempSrc:   Oral   SpO2: 98% 96%  93%  Weight:      Height:        Intake/Output from previous day:  Intake/Output Summary (Last 24 hours) at 05/25/2023 0901 Last data filed at 05/25/2023 0841 Gross per 24 hour  Intake --  Output 2650 ml  Net -2650 ml    Physical Exam:  Chronically ill male Decreased BS right mid/base no active wheezing  PMI increased JVP elevated no murmur  Abdomen benign Plus one LE edema bilateral  Palpable pedal pulses   Lab Results: Basic Metabolic Panel: Recent Labs    05/24/23 1332 05/25/23 0348  NA 137 135  K 4.0 3.5  CL 105 103  CO2 21* 22  GLUCOSE 152* 102*  BUN 21 18  CREATININE 0.92 0.89  CALCIUM 9.1 8.4*    CBC: Recent Labs    05/24/23 1332 05/25/23 0348  WBC 15.6* 12.0*  NEUTROABS 14.3*  --   HGB 12.7* 10.6*  HCT 39.9 33.0*  MCV 98.0 94.6  PLT 373 335     Imaging: CT Angio Chest/Abd/Pel for Dissection W and/or Wo Contrast Result Date: 05/24/2023 CLINICAL DATA:  Acute aortic syndrome suspected. Concern for pulmonary embolism. Shortness of breath. EXAM: CT ANGIOGRAPHY CHEST, ABDOMEN AND PELVIS TECHNIQUE: Non-contrast CT of the chest was initially obtained. Multidetector CT imaging through the chest, abdomen and pelvis was performed using the standard protocol during bolus administration of intravenous contrast. Multiplanar reconstructed images and MIPs were obtained and reviewed to evaluate the vascular anatomy. RADIATION DOSE REDUCTION: This exam was performed according to the departmental dose-optimization program which includes automated exposure control, adjustment of the mA and/or kV according to patient size  and/or use of iterative reconstruction technique. CONTRAST:  75mL OMNIPAQUE IOHEXOL 350 MG/ML SOLN COMPARISON:  Chest CT dated 04/09/2022. FINDINGS: Evaluation is limited due to streak artifact caused by patient's arms and overlying metallic support apparatus. CTA CHEST FINDINGS Cardiovascular: There is no cardiomegaly. Trace pericardial effusion. There is 3 vessel coronary vascular calcification. Moderate atherosclerotic calcification of the thoracic aorta. No aneurysmal dilatation or dissection. The origins of the great vessels of the aortic arch appear patent. Large pulmonary artery embolus involving the right main pulmonary artery as seen on the prior CT extending into the right middle and right lower lobe branches. Interval resolution of the previously seen left lower lobe pulmonary artery emboli. Small nonocclusive clot in the lingular branch seen Callie decreased since the prior CT. No CT evidence of right heart straining. Mediastinum/Nodes: No hilar or mediastinal adenopathy. The esophagus and the thyroid gland are grossly unremarkable. No mediastinal fluid collection. Lungs/Pleura: Moderate right pleural effusion with partial compressive atelectasis of the right lung. Pneumonia is not excluded. Background of centrilobular emphysema. There are bilateral upper lobe linear atelectasis/scarring. Faint ground-glass nodule in the lingula measures 4 mm. Additional subpleural nodules in the medial left lung base versus areas of scarring measure up to 7 mm. No pneumothorax. The central airways are patent. Musculoskeletal: Degenerative changes of the spine. No acute osseous pathology. Review of the MIP images confirms the above findings. CTA ABDOMEN AND PELVIS FINDINGS  VASCULAR Aorta: Moderate atherosclerotic calcification. No aneurysmal dilatation or dissection. No periaortic fluid collection. Celiac: The celiac trunk and its major branches are patent. SMA: Atherosclerotic calcification of the origin of the SMA. The  SMA is patent. Renals: The renal arteries are patent. IMA: The IMA is patent. Inflow: Moderate atherosclerotic calcification of the iliac arteries. There is high-grade narrowing of the right internal iliac artery as seen previously. The external iliac arteries remain patent. Veins: No obvious venous abnormality within the limitations of this arterial phase study. Review of the MIP images confirms the above findings. NON-VASCULAR No intra-abdominal free air or free fluid. Hepatobiliary: The liver is unremarkable.  Cholecystectomy. Pancreas: Unremarkable. No pancreatic ductal dilatation or surrounding inflammatory changes. Spleen: Normal in size without focal abnormality. Adrenals/Urinary Tract: The adrenal glands unremarkable. There is no hydronephrosis on either side. The urinary bladder is grossly unremarkable. Stomach/Bowel: Moderate stool throughout the colon. There is no bowel obstruction. No CT evidence of acute appendicitis. Lymphatic: No adenopathy. Reproductive: Prostate brachytherapy seeds. Other: Diffuse subcutaneous edema.  No fluid collection. Musculoskeletal: Osteopenia with degenerative changes of the spine. No acute osseous pathology. Review of the MIP images confirms the above findings. IMPRESSION: 1. Large pulmonary artery embolus involving the right main pulmonary artery as seen on the prior CT extending into the right middle and right lower lobe branches. Interval resolution of the previously seen left lower lobe pulmonary artery emboli. No CT evidence of right heart straining. 2. Moderate right pleural effusion with partial compressive atelectasis of the right lung. Pneumonia is not excluded. 3. No acute intra-abdominal or pelvic pathology. 4. Aortic Atherosclerosis (ICD10-I70.0) and Emphysema (ICD10-J43.9). Electronically Signed   By: Elgie Collard M.D.   On: 05/24/2023 16:48   DG Chest 2 View Result Date: 05/24/2023 CLINICAL DATA:  Shortness of breath. EXAM: CHEST - 2 VIEW COMPARISON:   April 18, 2023. FINDINGS: The heart size and mediastinal contours are within normal limits. Left lung is clear. Small loculated right pleural effusion is noted with associated right basilar atelectasis or inflammation. The visualized skeletal structures are unremarkable. IMPRESSION: Small loculated right pleural effusion is noted with associated right basilar atelectasis or inflammation. Electronically Signed   By: Lupita Raider M.D.   On: 05/24/2023 15:39    Cardiac Studies:  ECG: SR rate 93 RVH no acute ST changes    Telemetry:  NSR  Echo: 01/16/23. EF 40-45% moderate RV reduction pulmonary HTN  Medications:    budesonide (PULMICORT) nebulizer solution  0.5 mg Nebulization BID   furosemide  40 mg Intravenous BID   ipratropium-albuterol  3 mL Nebulization Q6H      heparin 1,500 Units/hr (05/25/23 0345)    Assessment/Plan:   CHF:  good diuresis on lasix 40 mg bid. BNP 2431 Non compliant with all meds and DNR. Add low dose coreg and ARB. 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 Pulmonary seeing patient continue nebulizers. Right heart cath 04/13/22 with PA pressures 84/24 mmHg and this was before two episodes of large PE. Add Revatio WHO class 3/4.   Charlton Haws 05/25/2023, 9:01 AM

## 2023-05-25 NOTE — Progress Notes (Signed)
 Echocardiogram 2D Echocardiogram has been performed.  Bruce [name] 05/25/2023, 5:07 PM

## 2023-05-25 NOTE — Progress Notes (Signed)
 PROGRESS NOTE    DELTON STELLE  BJY:[id number] DOB: 04-15-1945 DOA: 05/24/2023 PCP: Miki Kins, FNP   Brief Narrative:  78 year old male with history of COPD, chronic respiratory failure with hypoxia on 3 L oxygen via nasal cannula chronically, bilateral PE in February 2024 status post thrombolysis, nonobstructive coronary artery disease (as per cardiac catheterization in February 2024), paroxysmal A-fib, pulmonary hypertension, chronic systolic heart failure, medication noncompliance and continued tobacco abuse presented with worsening shortness of breath and chest discomfort.  On presentation, CTA chest showed right mainstem PE.  He was found to have elevated high-sensitivity troponin of 721 and 852 and elevated BNP of 2431.6 he was started on heparin drip and IV Lasix.  Pulmonary and cardiology were consulted.  Assessment & Plan:   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sm, right main pulmonary arterial thrombus Noncompliance with anticoagulation History of left lower extremity DVT -Continue heparin drip.  Pulmonary following.  Not a candidate for thrombectomy.  No evidence of right heart strain on CT. -Follow 2D echo and lower extremity duplex.  Severe pulmonary hypertension -Noncompliant with Eliquis and sildenafil  Acute on chronic respiratory failure with hypoxia Severe COPD Ongoing tobacco abuse -Uses 3 L oxygen via nasal cannula at home.  Required 5 L on presentation.  Currently on 3 L oxygen via nasal cannula. -Continue nebs. -Counseled regarding tobacco cessation by admitting hospitalist  Right pleural effusion -Continue diuretics for now.  If does not improve, might need thoracentesis  Acute on chronic combined CHF Positive troponins: Possibly from demand ischemia from above -Patient is noncompliant with his medications.  Presented with volume overload and elevated BNP. -Cardiology following.  Continue IV Lasix.  Strict input and output.  Daily weights.  Fluid  restriction  Paroxysmal A-fib -Currently rate controlled.  Not taking amiodarone, digoxin or Eliquis at home.  Leukocytosis -Improving.  Monitor  Goals of care Physical deconditioning -Overall prognosis is guarded to poor because of patient's noncompliance and continued tobacco abuse.  Palliative care has been consulted: Follow recommendations.    DVT prophylaxis: Heparin drip Code Status: DNR Family Communication: None at bedside Disposition Plan: Status is: Inpatient Remains inpatient appropriate because: Of severity of illness    Consultants: Pulmonary/cardiology/palliative care  Procedures: None  Antimicrobials: None   Subjective: Patient seen and examined at bedside.  Still short of breath with exertion.  Denies any chest pain, fever or vomiting.  Objective: Vitals:   05/25/23 0345 05/25/23 0400 05/25/23 0612 05/25/23 0630  BP: 112/82 107/83  119/82  Pulse: 73 78  79  Resp: (!) 24 (!) 26  (!) 28  Temp:   97.7 F (36.5 C)   TempSrc:   Oral   SpO2: 98% 96%  93%  Weight:      Height:        Intake/Output Summary (Last 24 hours) at 05/25/2023 0811 Last data filed at 05/24/2023 1900 Gross per 24 hour  Intake --  Output 1000 ml  Net -1000 ml   Filed Weights   05/24/23 1325  Weight: 72.6 kg    Examination:  General exam: Appears calm and comfortable.  On 3 L oxygen via nasal cannula. Respiratory system: Bilateral decreased breath sounds at bases with scattered crackles Cardiovascular system: S1 & S2 heard, Rate controlled Gastrointestinal system: Abdomen is nondistended, soft and nontender. Normal bowel sounds heard. Extremities: No cyanosis, clubbing; bilateral lower extremity edema present Central nervous system: Alert and oriented. No focal neurological deficits. Moving extremities Skin: No rashes, lesions or ulcers Psychiatry: Flat affect.  Not agitated    Data Reviewed: I have personally reviewed following labs and imaging  studies  CBC: Recent Labs  Lab 05/24/23 1332 05/25/23 0348  WBC 15.6* 12.0*  NEUTROABS 14.3*  --   HGB 12.7* 10.6*  HCT 39.9 33.0*  MCV 98.0 94.6  PLT 373 335   Basic Metabolic Panel: Recent Labs  Lab 05/24/23 1332 05/25/23 0348  NA 137 135  K 4.0 3.5  CL 105 103  CO2 21* 22  GLUCOSE 152* 102*  BUN 21 18  CREATININE 0.92 0.89  CALCIUM 9.1 8.4*   GFR: Estimated Creatinine Clearance: 73.6 mL/min (by C-G formula based on SCr of 0.89 mg/dL). Liver Function Tests: No results for input(s): "AST", "ALT", "ALKPHOS", "BILITOT", "PROT", "ALBUMIN" in the last 168 hours. No results for input(s): "LIPASE", "AMYLASE" in the last 168 hours. No results for input(s): "AMMONIA" in the last 168 hours. Coagulation Profile: Recent Labs  Lab 05/24/23 1706  INR 1.5*   Cardiac Enzymes: No results for input(s): "CKTOTAL", "CKMB", "CKMBINDEX", "TROPONINI" in the last 168 hours. BNP (last 3 results) No results for input(s): "PROBNP" in the last 8760 hours. HbA1C: No results for input(s): "HGBA1C" in the last 72 hours. CBG: No results for input(s): "GLUCAP" in the last 168 hours. Lipid Profile: No results for input(s): "CHOL", "HDL", "LDLCALC", "TRIG", "CHOLHDL", "LDLDIRECT" in the last 72 hours. Thyroid Function Tests: No results for input(s): "TSH", "T4TOTAL", "FREET4", "T3FREE", "THYROIDAB" in the last 72 hours. Anemia Panel: No results for input(s): "VITAMINB12", "FOLATE", "FERRITIN", "TIBC", "IRON", "RETICCTPCT" in the last 72 hours. Sepsis Labs: Recent Labs  Lab 05/24/23 1833  LATICACIDVEN 1.7    No results found for this or any previous visit (from the past 240 hours).       Radiology Studies: CT Angio Chest/Abd/Pel for Dissection W and/or Wo Contrast Result Date: 05/24/2023 CLINICAL DATA:  Acute aortic syndrome suspected. Concern for pulmonary embolism. Shortness of breath. EXAM: CT ANGIOGRAPHY CHEST, ABDOMEN AND PELVIS TECHNIQUE: Non-contrast CT of the chest was  initially obtained. Multidetector CT imaging through the chest, abdomen and pelvis was performed using the standard protocol during bolus administration of intravenous contrast. Multiplanar reconstructed images and MIPs were obtained and reviewed to evaluate the vascular anatomy. RADIATION DOSE REDUCTION: This exam was performed according to the departmental dose-optimization program which includes automated exposure control, adjustment of the mA and/or kV according to patient size and/or use of iterative reconstruction technique. CONTRAST:  75mL OMNIPAQUE IOHEXOL 350 MG/ML SOLN COMPARISON:  Chest CT dated 04/09/2022. FINDINGS: Evaluation is limited due to streak artifact caused by patient's arms and overlying metallic support apparatus. CTA CHEST FINDINGS Cardiovascular: There is no cardiomegaly. Trace pericardial effusion. There is 3 vessel coronary vascular calcification. Moderate atherosclerotic calcification of the thoracic aorta. No aneurysmal dilatation or dissection. The origins of the great vessels of the aortic arch appear patent. Large pulmonary artery embolus involving the right main pulmonary artery as seen on the prior CT extending into the right middle and right lower lobe branches. Interval resolution of the previously seen left lower lobe pulmonary artery emboli. Small nonocclusive clot in the lingular branch seen Callie decreased since the prior CT. No CT evidence of right heart straining. Mediastinum/Nodes: No hilar or mediastinal adenopathy. The esophagus and the thyroid gland are grossly unremarkable. No mediastinal fluid collection. Lungs/Pleura: Moderate right pleural effusion with partial compressive atelectasis of the right lung. Pneumonia is not excluded. Background of centrilobular emphysema. There are bilateral upper lobe linear atelectasis/scarring. Faint ground-glass nodule in  the lingula measures 4 mm. Additional subpleural nodules in the medial left lung base versus areas of scarring  measure up to 7 mm. No pneumothorax. The central airways are patent. Musculoskeletal: Degenerative changes of the spine. No acute osseous pathology. Review of the MIP images confirms the above findings. CTA ABDOMEN AND PELVIS FINDINGS VASCULAR Aorta: Moderate atherosclerotic calcification. No aneurysmal dilatation or dissection. No periaortic fluid collection. Celiac: The celiac trunk and its major branches are patent. SMA: Atherosclerotic calcification of the origin of the SMA. The SMA is patent. Renals: The renal arteries are patent. IMA: The IMA is patent. Inflow: Moderate atherosclerotic calcification of the iliac arteries. There is high-grade narrowing of the right internal iliac artery as seen previously. The external iliac arteries remain patent. Veins: No obvious venous abnormality within the limitations of this arterial phase study. Review of the MIP images confirms the above findings. NON-VASCULAR No intra-abdominal free air or free fluid. Hepatobiliary: The liver is unremarkable.  Cholecystectomy. Pancreas: Unremarkable. No pancreatic ductal dilatation or surrounding inflammatory changes. Spleen: Normal in size without focal abnormality. Adrenals/Urinary Tract: The adrenal glands unremarkable. There is no hydronephrosis on either side. The urinary bladder is grossly unremarkable. Stomach/Bowel: Moderate stool throughout the colon. There is no bowel obstruction. No CT evidence of acute appendicitis. Lymphatic: No adenopathy. Reproductive: Prostate brachytherapy seeds. Other: Diffuse subcutaneous edema.  No fluid collection. Musculoskeletal: Osteopenia with degenerative changes of the spine. No acute osseous pathology. Review of the MIP images confirms the above findings. IMPRESSION: 1. Large pulmonary artery embolus involving the right main pulmonary artery as seen on the prior CT extending into the right middle and right lower lobe branches. Interval resolution of the previously seen left lower lobe  pulmonary artery emboli. No CT evidence of right heart straining. 2. Moderate right pleural effusion with partial compressive atelectasis of the right lung. Pneumonia is not excluded. 3. No acute intra-abdominal or pelvic pathology. 4. Aortic Atherosclerosis (ICD10-I70.0) and Emphysema (ICD10-J43.9). Electronically Signed   By: Elgie Collard M.D.   On: 05/24/2023 16:48   DG Chest 2 View Result Date: 05/24/2023 CLINICAL DATA:  Shortness of breath. EXAM: CHEST - 2 VIEW COMPARISON:  April 18, 2023. FINDINGS: The heart size and mediastinal contours are within normal limits. Left lung is clear. Small loculated right pleural effusion is noted with associated right basilar atelectasis or inflammation. The visualized skeletal structures are unremarkable. IMPRESSION: Small loculated right pleural effusion is noted with associated right basilar atelectasis or inflammation. Electronically Signed   By: Lupita Raider M.D.   On: 05/24/2023 15:39        Scheduled Meds:  furosemide  40 mg Intravenous BID   Continuous Infusions:  heparin 1,500 Units/hr (05/25/23 0345)          Glade Lloyd, MD [facility] 05/25/2023, 8:11 AM

## 2023-05-25 NOTE — Progress Notes (Signed)
   05/25/23 1312  Wound / Incision (Open or Dehisced) 04/19/23 Non-pressure wound Ankle Right;Lateral 0.7x0.6 cm open area with purlent drainage and pink borders  Date First Assessed/Time First Assessed: 04/19/23 1300   Wound Type: Non-pressure wound  Location: Ankle  Location Orientation: Right;Lateral  Wound Description (Comments): 0.7x0.6 cm open area with purlent drainage and pink borders  Present on Admiss...  Dressing Type None  Site / Wound Assessment Clean;Pink;Yellow  Peri-wound Assessment Intact;Pink  Wound Length (cm) 2 cm  Wound Width (cm) 2 cm  Wound Surface Area (cm^2) 4 cm^2  Drainage Amount Scant  Drainage Description Purulent  Treatment Cleansed;Other (Comment) (pt refused all dressings to cover wound)   This wound was present prior to this hospital admission.

## 2023-05-25 NOTE — Progress Notes (Signed)
 PHARMACY - ANTICOAGULATION Pharmacy Consult for heparin Indication: pulmonary embolus Brief A/P: Heparin level subtherapeutic Increase Heparin rate  Allergies  Allergen Reactions   Adhesive [Tape] Other (See Comments)    After right leg fracture surgery, pt developed a large blister where tape was applied to his right leg. OK to use paper tape.   Eggs-Apples-Oats [Alitraq] Other (See Comments)    Caused chest pains   Imdur [Isosorbide Dinitrate] Other (See Comments)    hallucinations   Singulair [Montelukast Sodium] Other (See Comments)    Hallucinations     Patient Measurements: Height: 6' (182.9 cm) Weight: 72.6 kg (160 lb) IBW/kg (Calculated) : 77.6 Heparin Dosing Weight: 72.6 kg   Vital Signs: Temp: 97.6 F (36.4 C) (03/21 0221) Temp Source: Oral (03/21 0221) BP: 130/90 (03/21 0215) Pulse Rate: 78 (03/21 0215)  Labs: Recent Labs    05/24/23 1332 05/24/23 1544 05/24/23 1706 05/25/23 0206  HGB 12.7*  --   --   --   HCT 39.9  --   --   --   PLT 373  --   --   --   APTT  --   --  34 36  LABPROT  --   --  17.8*  --   INR  --   --  1.5*  --   HEPARINUNFRC  --   --   --  <0.10*  CREATININE 0.92  --   --   --   TROPONINIHS 721* 852*  --   --     Estimated Creatinine Clearance: 71.2 mL/min (by C-G formula based on SCr of 0.92 mg/dL).   Assessment: 78 y.o. 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   Geannie Risen, PharmD, BCPS   05/25/2023 3:30 AM

## 2023-05-25 NOTE — Progress Notes (Signed)
 NAME:  Bruce [name], MRN:  [id number], DOB:  07-27-1945, LOS: 1 ADMISSION DATE:  05/24/2023, CONSULTATION DATE:  05/25/2023  REFERRING MD:  Letitia Caul, CHIEF COMPLAINT: Shortness of breath  History of Present Illness:  78 year old man with severe COPD, chronic respiratory failure on 3 L of oxygen, CTEPH was evaluated in advanced heart failure clinic today and reported worsening dyspnea on exertion and rest and generalized weakness, noted to be hypoxic and sent to the ED for further evaluation. Of note he has a paramedic visit him once a week and layout his pillbox, CHF note states that paramedic mentioned that he has been throwing medications the trash.  Patient denies this but also does not know that he is on a blood thinner medication. ED labs showed troponin 721, BNP 2431, chest x-ray with a right loculated right pleural effusion. CT angiogram chest showed large pulmonary artery embolus involving right main pulmonary artery extending to right middle and right lower branches.  Resolution of left lower lobe pulmonary artery emboli, large right effusion.   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 EF 40 to 45% with moderately reduced RV function and pulm hypertension 04/2022 nonobstructive CAD, normal LVEDP, severe pulmonary hypertension 85/24 Submassive PE 04/2022 requiring IR thrombectomy, subsequent CT imaging and VQ scan consistent with CTEPH , felt not to be a candidate for thrombectomy Prostate cancer Chronic cor pulmonale   Significant Hospital Events: Including procedures, antibiotic start and stop dates in addition to other pertinent events   3/20 admitted  Interim History / Subjective:   No acute events overnight Increasing O2 needs to 5-10L Denies feeling any more short of breath than before  Objective   Blood pressure 119/82, pulse 79, temperature 97.7 F (36.5 C), temperature source Oral, resp. rate (!) 28, height 6' (1.829 m), weight 72.6 kg, SpO2 93%.         Intake/Output Summary (Last 24 hours) at 05/25/2023 2440 Last data filed at 05/25/2023 0841 Gross per 24 hour  Intake --  Output 2650 ml  Net -2650 ml   Filed Weights   05/24/23 1325  Weight: 72.6 kg    Examination: General: Chronically ill-appearing man, no distress, on 3 L nasal cannula HENT: Mild pallor, no icterus, flat neck veins Lungs: Decreased breath sounds bilateral, no accessory muscle use Cardiovascular: S1-S2 regular, no murmur Abdomen: Soft, nontender no organomegaly Extremities: 2+ edema on left, right leg tender 1+ edema Neuro: Alert, interactive, nonfocal, no asterixis  Labs show normal electrolytes, troponin 721/852, BNP 2431, mild leukocytosis   Resolved Hospital Problem list     Assessment & Plan:  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 Acute on Chronic hypoxemic respiratory failure Right pleural effusion COPD with acute exacerbation  Plan - continue heparin drip for pulmonary emboli - continue supplemental oxygen for goal SpO2 88% or higher - Possible thoracentesis 3/22, will perform bedside US - Continue budesonide and duonebs - add hypertonic saline nebs twice daily along with flutter valve - continue sildenafil for pulmonary hypertension - add doxycycline for COPD exacerbation - prednisone 40mg  daily  Acute on chronic biventricular heart failure -poor compliance with meds Paroxysmal atrial fibrillation -Was on amiodarone and digoxin, cardiology following  Palliative care consultation would be appropriate given his noncompliance   Best Practice (right click and "Reselect all SmartList Selections" daily)   Diet/type: Regular consistency (see orders) DVT prophylaxis systemic heparin Pressure ulcer(s): N/A GI prophylaxis: N/A Lines: N/A Foley:  N/A Code Status:  full code Last  date of multidisciplinary goals of care discussion [NA]  Labs   CBC: Recent Labs  Lab 05/24/23 1332 05/25/23 0348   WBC 15.6* 12.0*  NEUTROABS 14.3*  --   HGB 12.7* 10.6*  HCT 39.9 33.0*  MCV 98.0 94.6  PLT 373 335    Basic Metabolic Panel: Recent Labs  Lab 05/24/23 1332 05/25/23 0348  NA 137 135  K 4.0 3.5  CL 105 103  CO2 21* 22  GLUCOSE 152* 102*  BUN 21 18  CREATININE 0.92 0.89  CALCIUM 9.1 8.4*   GFR: Estimated Creatinine Clearance: 73.6 mL/min (by C-G formula based on SCr of 0.89 mg/dL). Recent Labs  Lab 05/24/23 1332 05/24/23 1833 05/25/23 0348  WBC 15.6*  --  12.0*  LATICACIDVEN  --  1.7  --     Liver Function Tests: No results for input(s): "AST", "ALT", "ALKPHOS", "BILITOT", "PROT", "ALBUMIN" in the last 168 hours. No results for input(s): "LIPASE", "AMYLASE" in the last 168 hours. No results for input(s): "AMMONIA" in the last 168 hours.  ABG    Component Value Date/Time   PHART 7.434 04/13/2022 1118   PCO2ART 26.5 (L) 04/13/2022 1118   PO2ART 56 (L) 04/13/2022 1118   HCO3 24.6 04/18/2023 1826   TCO2 24 04/13/2022 1125   TCO2 25 04/13/2022 1125   ACIDBASEDEF 1.0 04/13/2022 1125   O2SAT 59.6 04/18/2023 1826     Coagulation Profile: Recent Labs  Lab 05/24/23 1706  INR 1.5*    Cardiac Enzymes: No results for input(s): "CKTOTAL", "CKMB", "CKMBINDEX", "TROPONINI" in the last 168 hours.  HbA1C: Hgb A1c MFr Bld  Date/Time Value Ref Range Status  02/05/2023 09:51 AM 5.6 4.8 - 5.6 % Final    Comment:             Prediabetes: 5.7 - 6.4          Diabetes: >6.4          Glycemic control for adults with diabetes: <7.0   11/02/2022 02:06 PM 5.7 (H) 4.8 - 5.6 % Final    Comment:             Prediabetes: 5.7 - 6.4          Diabetes: >6.4          Glycemic control for adults with diabetes: <7.0     CBG: No results for input(s): "GLUCAP" in the last 168 hours.     Melody Comas, MD [facility] Office: [phone]   See Amion for personal pager PCCM on call pager [phone] until 7pm. Please call Elink 7p-7a.  [phone]

## 2023-05-25 NOTE — Plan of Care (Signed)
   Problem: Education: Goal: Knowledge of General Education information will improve Description: Including pain rating scale, medication(s)/side effects and non-pharmacologic comfort measures Outcome: Progressing   Problem: Coping: Goal: Level of anxiety will decrease Outcome: Progressing   Problem: Safety: Goal: Ability to remain free from injury will improve Outcome: Progressing

## 2023-05-25 NOTE — Progress Notes (Signed)
 Bilateral lower extremity venous duplex has been completed.  Results can be found in chart review under CV Proc.  05/25/2023 11:01 AM  Fernande Bras, RVT.

## 2023-05-25 NOTE — Consult Note (Signed)
 Consultation Note Date: 05/25/2023   Patient Name: Bruce [name]  DOB: 1945/04/15  MRN: [id number]  Age / Sex: 78 y.o., male  PCP: Miki Kins, FNP Referring Physician: Glade Lloyd, MD  Reason for Consultation: Establishing goals of care  HPI/Patient Profile: 78 y.o. 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   Patient was referred to ED from outpatient cardiology appointment. The workup in the emergency department led to a CTA and the diagnosis of the right mainstem PE. His troponin was elevated and he has evidence of congestive heart failure with a right pleural effusion and bilateral lower extremity edema well as an elevated BNP. Patient was admitted to the hospitalist service on a heparin drip and IV Lasix for his heart failure.   PMT has been consulted to assist with goals of care conversation.  Clinical Assessment and Goals of Care:  I have reviewed medical records including EPIC notes, labs and imaging, assessed the patient and then met at the bedside to discuss diagnosis prognosis, GOC, EOL wishes, disposition and options.  I introduced Palliative Medicine as specialized medical care for people living with serious illness. It focuses on providing relief from the symptoms and stress of a serious illness. The goal is to improve quality of life for both the patient and the family.  We discussed a brief life review of the patient and then focused on their current illness.   I attempted to elicit values and goals of care important to the patient.    Medical History Review and Understanding:  We discussed patient's acute illness in the context of  his chronic comorbidities.  He understands the main issue is with blood clots his lungs.  We discussed how this relates to COPD, irreversible nature of his chronic lung disease, heart failure.  Social History: Patient is twice divorced.  He has 2's sons, though he is not close with them and has "disowned" one, and a daughter that he is not close with either.  He is currently living with a roommate since fall of last year and this is a very difficult situation with him.  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  He believes in God but has not been to church in many years.  He has several friends locally.  Functional and Nutritional State: Patient can walk from his bedroom to the kitchen before he needs to rest/sit.  This is about half the distance in his home.  He reports he is independent with all ADLs.  Palliative Symptoms: Dyspnea, fatigue, right leg pain  Code Status: Concepts specific to code status, artifical feeding and hydration, and rehospitalization were considered and discussed.  Wishes for DNR/DNI confirmed.  Discussion: Patient knowledges that he is getting down to the point where it is "push comes to shove."  He feels his quality of life is poor due to both his symptom burden and his current living situation, as his roommate is over charging him rent and does not allow him to clean or have company over.  He has not been nearly as active for the past 5 or 6 months, which is very unlike him.  He has not felt like himself in several years.  He states "when my grandpa died, I died with him."  This was when he was 78 years old.  He is tearful and emotional discussing all the fallouts in his family.  Emotional support therapeutic listening was provided. Patient's goals are to "not have any trouble, not be sick, and to be left alone."  He also wants to remain as independent as he can.  He has been considering going to a nursing home because it has gotten that bad  with his roommate.  He is still unsure.  I explored his thoughts on hospice given his preference to avoid medications and whether he would be at peace with that.  He initially states "I do not want to go anywhere near hospice" because "people run their mouths."  He is seeing other people in hospice and is ultimately open to further discussions.  He is concerned about how this would work, especially if he is unable to move and his roommate is hostile.  He does not think he can be on his own and I shared that I agree.  He is appreciative of care discussions to come up with the right plan for him.   The difference between aggressive medical intervention and comfort care was considered in light of the patient's goals of care. Hospice and Palliative Care services outpatient were explained and offered.   Discussed the importance of continued conversation with family and the medical providers regarding overall plan of care and treatment options, ensuring decisions are within the context of the patient's values and GOCs.   Questions and concerns were addressed.  Hard Choices booklet left for review. The family was encouraged to call with questions or concerns.  PMT will continue to support holistically.   SUMMARY OF RECOMMENDATIONS   -Continue DNR/DNI -Continue current care for now -Ongoing goals of care discussions.  Patient is unsure about his preference for the focus of his care and does not have family support in making decisions -Psychosocial and emotional support provided -PMT will continue to follow and support  Prognosis:  Poor, hospice would be appropriate if aligned with goals of care  Discharge Planning: To Be Determined      Primary Diagnoses: Present on Admission:  Acute on chronic combined systolic and diastolic CHF (congestive heart failure) (HCC)   Physical Exam Vitals and nursing note reviewed.  Constitutional:      General: He is not in acute distress.    Appearance: He is  ill-appearing.  Cardiovascular:     Rate and Rhythm: Normal rate.  Pulmonary:     Effort: Pulmonary effort is normal.     Comments: Wheezing/coughing Neurological:     Mental Status: He is alert and oriented to person, place, and time.  Psychiatric:        Mood and Affect: Mood is depressed.        Behavior: Behavior normal.        Cognition and Memory: Cognition normal.     Vital Signs: BP 98/72   Pulse 77   Temp 97.7 F (36.5 C) (Axillary)   Resp (!) 26   Ht 6' (1.829 m)   Wt 72.6 kg   SpO2 91%   BMI 21.70 kg/m  Pain Scale: 0-10   Pain Score: 0-No pain   SpO2: SpO2: 91 % O2 Device:SpO2: 91 % O2 Flow Rate: .O2 Flow Rate (L/min): 3 L/min   Palliative Assessment/Data: 60-70%    Total time: I spent 105 minutes in the care of the patient today  in the above activities and documenting the encounter.  MDM: high   Aldean Suddeth Jeni Salles, PA-C  Palliative Medicine Team Team phone # [phone]  Thank you for allowing the Palliative Medicine Team to assist in the care of this patient. Please utilize secure chat with additional questions, if there is no response within 30 minutes please call the above phone number.  Palliative Medicine Team providers are available by phone from 7am to 7pm daily and can be reached through the team cell phone.  Should this patient require assistance outside of these hours, please call the patient's attending physician.

## 2023-05-25 NOTE — ED Notes (Signed)
Pt refused socks.

## 2023-05-26 DIAGNOSIS — I2721 Secondary pulmonary arterial hypertension: Secondary | ICD-10-CM | POA: Diagnosis not present

## 2023-05-26 DIAGNOSIS — I2699 Other pulmonary embolism without acute cor pulmonale: Secondary | ICD-10-CM | POA: Diagnosis not present

## 2023-05-26 DIAGNOSIS — Z7189 Other specified counseling: Secondary | ICD-10-CM | POA: Diagnosis not present

## 2023-05-26 DIAGNOSIS — I5082 Biventricular heart failure: Secondary | ICD-10-CM | POA: Diagnosis not present

## 2023-05-26 DIAGNOSIS — Z91199 Patient's noncompliance with other medical treatment and regimen due to unspecified reason: Secondary | ICD-10-CM

## 2023-05-26 DIAGNOSIS — J439 Emphysema, unspecified: Secondary | ICD-10-CM

## 2023-05-26 DIAGNOSIS — J441 Chronic obstructive pulmonary disease with (acute) exacerbation: Secondary | ICD-10-CM | POA: Diagnosis not present

## 2023-05-26 DIAGNOSIS — I428 Other cardiomyopathies: Secondary | ICD-10-CM

## 2023-05-26 DIAGNOSIS — I5043 Acute on chronic combined systolic (congestive) and diastolic (congestive) heart failure: Secondary | ICD-10-CM | POA: Diagnosis not present

## 2023-05-26 DIAGNOSIS — J9621 Acute and chronic respiratory failure with hypoxia: Secondary | ICD-10-CM | POA: Diagnosis not present

## 2023-05-26 DIAGNOSIS — J90 Pleural effusion, not elsewhere classified: Secondary | ICD-10-CM | POA: Diagnosis not present

## 2023-05-26 LAB — BASIC METABOLIC PANEL
Anion gap: 10 (ref 5–15)
BUN: 22 mg/dL (ref 8–23)
CO2: 25 mmol/L (ref 22–32)
Calcium: 9 mg/dL (ref 8.9–10.3)
Chloride: 99 mmol/L (ref 98–111)
Creatinine, Ser: 0.95 mg/dL (ref 0.61–1.24)
GFR, Estimated: >60 mL/min (ref >60)
Glucose, Bld: 141 mg/dL — ABNORMAL HIGH (ref 70–99)
Potassium: 3.3 mmol/L — ABNORMAL LOW (ref 3.5–5.1)
Sodium: 134 mmol/L — ABNORMAL LOW (ref 135–145)

## 2023-05-26 LAB — CBC
HCT: 36 % — ABNORMAL LOW (ref 39.0–52.0)
Hemoglobin: 12 g/dL — ABNORMAL LOW (ref 13.0–17.0)
MCH: 30.9 pg (ref 26.0–34.0)
MCHC: 33.3 g/dL (ref 30.0–36.0)
MCV: 92.8 fL (ref 80.0–100.0)
Platelets: 354 10*3/uL (ref 150–400)
RBC: 3.88 MIL/uL — ABNORMAL LOW (ref 4.22–5.81)
RDW: 15 % (ref 11.5–15.5)
WBC: 13.2 10*3/uL — ABNORMAL HIGH (ref 4.0–10.5)
nRBC: 0 % (ref 0.0–0.2)

## 2023-05-26 LAB — MAGNESIUM: Magnesium: 1.7 mg/dL (ref 1.7–2.4)

## 2023-05-26 LAB — HEPARIN LEVEL (UNFRACTIONATED)
Heparin Unfractionated: 0.53 [IU]/mL (ref 0.30–0.70)
Heparin Unfractionated: 0.59 [IU]/mL (ref 0.30–0.70)

## 2023-05-26 MED ORDER — POTASSIUM CHLORIDE CRYS ER 20 MEQ PO TBCR
40.0000 meq | EXTENDED_RELEASE_TABLET | ORAL | Status: AC
  Administered 2023-05-26 (×2): 40 meq via ORAL
  Filled 2023-05-26 (×2): qty 2

## 2023-05-26 NOTE — Progress Notes (Signed)
 NAME:  Bruce [name], MRN:  [id number], DOB:  Feb 18, 1946, LOS: 2 ADMISSION DATE:  05/24/2023, CONSULTATION DATE:  05/26/2023  REFERRING MD:  Letitia Caul, CHIEF COMPLAINT: Shortness of breath  History of Present Illness:  78 year old man with severe COPD, chronic respiratory failure on 3 L of oxygen, CTEPH was evaluated in advanced heart failure clinic today and reported worsening dyspnea on exertion and rest and generalized weakness, noted to be hypoxic and sent to the ED for further evaluation. Of note he has a paramedic visit him once a week and layout his pillbox, CHF note states that paramedic mentioned that he has been throwing medications the trash.  Patient denies this but also does not know that he is on a blood thinner medication. ED labs showed troponin 721, BNP 2431, chest x-ray with a right loculated right pleural effusion. CT angiogram chest showed large pulmonary artery embolus involving right main pulmonary artery extending to right middle and right lower branches.  Resolution of left lower lobe pulmonary artery emboli, large right effusion.   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 EF 40 to 45% with moderately reduced RV function and pulm hypertension 04/2022 nonobstructive CAD, normal LVEDP, severe pulmonary hypertension 85/24 Submassive PE 04/2022 requiring IR thrombectomy, subsequent CT imaging and VQ scan consistent with CTEPH , felt not to be a candidate for thrombectomy Prostate cancer Chronic cor pulmonale   Significant Hospital Events: Including procedures, antibiotic start and stop dates in addition to other pertinent events   3/20 admitted increased WOB and weakness 3/21 pulm asked to see for abnormal CT chest. Seen by cards. Good response to diuretics. 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 Seen by [facility]. DNR confirmed.  3/22 feels better still on heated high flow, discussed merits of diagnostic/therapeutic thoracentesis patient did not want to proceed at this  moment  Interim History / Subjective:  Sitting up in chair feels a little bit better still has a little bit of discomfort from the right side of his chest   Objective   Blood pressure 97/61, pulse 72, temperature 97.7 F (36.5 C), temperature source Oral, resp. rate 19, height 6\' 1"  (1.854 m), weight 69.2 kg, SpO2 92%.    FiO2 (%):  [50 %-60 %] 60 %   Intake/Output Summary (Last 24 hours) at 05/26/2023 1508 Last data filed at 05/26/2023 1241 Gross per 24 hour  Intake 1526.8 ml  Output 725 ml  Net 801.8 ml   Filed Weights   05/24/23 1325 05/25/23 1312 05/26/23 0425  Weight: 72.6 kg 67 kg 69.2 kg    Examination:  General pleasant 78 year old male patient sitting up in chair no acute distress HEENT normocephalic atraumatic no jugular venous distention Pulmonary fairly reduced on the right no accessory use portable chest x-ray from the 21st showed right loculated effusion Cardiac regular rate and rhythm Abdomen soft not tender Extremities warm dry Neuro intact  Resolved Hospital Problem list     Assessment & Plan:  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  He is feeling better with diuresis, however if indeed the fluid is loculated he will not get reexpansion of his chest without drainage.  At this point it is unclear how long the fluid has been loculated had a normal chest x-ray in the  beginning of February however simply the presence of loculation suggests it somewhat chronic in nature.  We discussed at length thoracentesis as well as small bore thoracostomy tube as treatment for loculated effusions and to also allow [place] to further evaluate.  He really  wants to avoid intervention.  I explained to him that the longer the fluid is loculated the higher the chance of fibrothorax, however I cannot guarantee his lung will reexpand even if we do drain the fluid today.  I see he is speaking with palliative.  At this point he wants to continue noninvasive route of therapy   plan Continue budesonide and duonebs Cont hypertonic saline nebs twice daily along with flutter valve continue sildenafil for pulmonary hypertension added doxycycline for COPD exacerbation prednisone 40mg  daily day 2/5 Will repeat chest x-ray in the morning and plan on bringing ultrasound for evaluation again on 3/23.  If he still has significant pleural fluid will offer thoracentesis or perhaps even small for thoracotomy tube again, however this may be a situation where we diurese him the best we can, stabilize obstructive airway disease, and understand that he will have some chronic pleural changes  Acute on chronic biventricular heart failure -poor compliance with meds Paroxysmal atrial fibrillation plan Recs per cards    Best Practice (right click and "Reselect all SmartList Selections" daily)   Diet/type: Regular consistency (see orders) DVT prophylaxis systemic heparin Pressure ulcer(s): N/A GI prophylaxis: N/A Lines: N/A Foley:  N/A Code Status:  full code Last date of multidisciplinary goals of care discussion [NA]
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
## 2023-05-26 NOTE — Progress Notes (Signed)
 PHARMACY - ANTICOAGULATION Pharmacy Consult for heparin Indication: pulmonary embolus  Allergies  Allergen Reactions   Adhesive [Tape] Other (See Comments)    After right leg fracture surgery, pt developed a large blister where tape was applied to his right leg. OK to use paper tape.   Eggs-Apples-Oats [Alitraq] Other (See Comments)    Caused chest pains   Imdur [Isosorbide Dinitrate] Other (See Comments)    hallucinations   Singulair [Montelukast Sodium] Other (See Comments)    Hallucinations     Patient Measurements: Height: 6\' 1"  (185.4 cm) Weight: 69.2 kg (152 lb 8.9 oz) IBW/kg (Calculated) : 79.9 Heparin Dosing Weight: 72.6 kg   Vital Signs: Temp: 97 F (36.1 C) (03/22 1552) Temp Source: Oral (03/22 1552) BP: 100/66 (03/22 1552) Pulse Rate: 76 (03/22 1552)  Labs: Recent Labs    05/24/23 1332 05/24/23 1544 05/24/23 1706 05/25/23 0206 05/25/23 0348 05/25/23 1153 05/25/23 2206 05/26/23 0802 05/26/23 1547  HGB 12.7*  --   --   --  10.6*  --   --  12.0*  --   HCT 39.9  --   --   --  33.0*  --   --  36.0*  --   PLT 373  --   --   --  335  --   --  354  --   APTT  --   --  34 36  --   --   --   --   --   LABPROT  --   --  17.8*  --   --   --   --   --   --   INR  --   --  1.5*  --   --   --   --   --   --   HEPARINUNFRC  --   --   --  <0.10*  --    < > 0.30 0.53 0.59  CREATININE 0.92  --   --   --  0.89  --   --  0.95  --   TROPONINIHS 721* 852*  --   --   --   --   --   --   --    < > = values in this interval not displayed.    Estimated Creatinine Clearance: 65.8 mL/min (by C-G formula based on SCr of 0.95 mg/dL).   Assessment: 78 y.o. 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 Hgb 12.0, PLT 354 -- stable. No issues with heparin infusion and no new bleeding noted per RN. Of note, patient has worsening respiratory status and is now requiring 30 L/min  of HFNC.   Heparin level therapeutic x2. Cont current rate.   Goal of Therapy:  Heparin level 0.3-0.7 units/mL Monitor platelets by anticoagulation protocol: Yes   Plan:  Continue heparin at 1850 units/hr Heparin level and CBC daily while on heparin  Monitor for s/sx of bleeding  Follow-up plans to transition to PO anticoagulation  Ulyses Southward, PharmD, BCIDP, AAHIVP, CPP Infectious Disease Pharmacist 05/26/2023 4:17 PM

## 2023-05-26 NOTE — Plan of Care (Signed)
   Problem: Activity: Goal: Risk for activity intolerance will decrease Outcome: Progressing

## 2023-05-26 NOTE — Progress Notes (Signed)
 PHARMACY - ANTICOAGULATION Pharmacy Consult for heparin Indication: pulmonary embolus  Allergies  Allergen Reactions   Adhesive [Tape] Other (See Comments)    After right leg fracture surgery, pt developed a large blister where tape was applied to his right leg. OK to use paper tape.   Eggs-Apples-Oats [Alitraq] Other (See Comments)    Caused chest pains   Imdur [Isosorbide Dinitrate] Other (See Comments)    hallucinations   Singulair [Montelukast Sodium] Other (See Comments)    Hallucinations     Patient Measurements: Height: 6\' 1"  (185.4 cm) Weight: 69.2 kg (152 lb 8.9 oz) IBW/kg (Calculated) : 79.9 Heparin Dosing Weight: 72.6 kg   Vital Signs: Temp: 96.9 F (36.1 C) (03/22 0749) Temp Source: Axillary (03/22 0749) BP: 99/65 (03/22 0749) Pulse Rate: 75 (03/22 0749)  Labs: Recent Labs    05/24/23 1332 05/24/23 1332 05/24/23 1544 05/24/23 1706 05/25/23 0206 05/25/23 0348 05/25/23 1153 05/25/23 2206 05/26/23 0802  HGB 12.7*  --   --   --   --  10.6*  --   --  12.0*  HCT 39.9  --   --   --   --  33.0*  --   --  36.0*  PLT 373  --   --   --   --  335  --   --  354  APTT  --   --   --  34 36  --   --   --   --   LABPROT  --   --   --  17.8*  --   --   --   --   --   INR  --   --   --  1.5*  --   --   --   --   --   HEPARINUNFRC  --    < >  --   --  <0.10*  --  0.18* 0.30 0.53  CREATININE 0.92  --   --   --   --  0.89  --   --   --   TROPONINIHS 721*  --  852*  --   --   --   --   --   --    < > = values in this interval not displayed.    Estimated Creatinine Clearance: 70.2 mL/min (by C-G formula based on SCr of 0.89 mg/dL).   Assessment: 78 y.o. 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 Hgb 12.0, PLT 354 -- stable. No issues with heparin infusion and no new bleeding noted per RN. Of note, patient has worsening respiratory status and is now requiring 30  L/min of HFNC.   Goal of Therapy:  Heparin level 0.3-0.7 units/mL Monitor platelets by anticoagulation protocol: Yes   Plan:  Continue heparin at 1850 units/hr Confirmatory heparin level in 8 hours  Heparin level and CBC daily while on heparin  Monitor for s/sx of bleeding  Follow-up plans to transition to PO anticoagulation  Lennie Muckle, PharmD PGY1 Pharmacy Resident 05/26/2023 9:10 AM

## 2023-05-26 NOTE — Progress Notes (Addendum)
 Mobility Specialist Progress Note:   05/26/23 1500  Mobility  Activity Transferred to/from [facility]  Level of Assistance Contact guard assist, steadying assist  Assistive Device None;BSC  Distance Ambulated (ft) 3 ft  Activity Response Tolerated well  Mobility Referral Yes  Mobility visit 1 Mobility  Mobility Specialist Start Time (ACUTE ONLY) 1500  Mobility Specialist Stop Time (ACUTE ONLY) 1513  Mobility Specialist Time Calculation (min) (ACUTE ONLY) 13 min   Pt requesting to go to [facility]. Able to transfer to [facility] with only contact assist for safety. Limited distance d/t HHFNC tubing, however pt wanting to ambulate to BR. Pt left sitting on BSC for BM, will f/u.  Addison Lank Mobility Specialist Please contact via SecureChat or  Rehab office at [phone]

## 2023-05-26 NOTE — Progress Notes (Signed)
 Cardiologist:  Turner/Bensimohn  Subjective:   Denies anginal chest pain. Shortness of breath improving. Currently on high flow nasal cannula oxygen  Objective:  Vitals:   05/26/23 0425 05/26/23 0749 05/26/23 0753 05/26/23 1239  BP: 101/63 99/65  97/61  Pulse: 66 75  72  Resp: 18 19  19   Temp: 97.6 F (36.4 C) (!) 96.9 F (36.1 C)  97.7 F (36.5 C)  TempSrc: Oral Axillary  Oral  SpO2: 99% 95% 95% 100%  Weight: 69.2 kg     Height:        Intake/Output from previous day:  Intake/Output Summary (Last 24 hours) at 05/26/2023 1342 Last data filed at 05/26/2023 1241 Gross per 24 hour  Intake 1526.8 ml  Output 725 ml  Net 801.8 ml    Physical Exam:  General: Chronically ill, cachectic, hemodynamically stable, no acute distress HEENT: Arcus senilis, trachea midline, neck supple.  On high flow nasal cannula oxygen Lungs: Decreased breath sounds bilaterally, no wheezes rales or rhonchi's, poor inspiratory effort Heart: Regular, positive S1-S2, systolic murmur, no gallops or rubs Abdomen: Soft, nontender, nondistended, positive bowel sounds in all 4 quadrants Extremities: Right lower extremity notes chronic venous stasis, left lower extremity 1+ pitting edema Neuro: Alert oriented x 4, no acute distress, moves all 4 extremities  Lab Results: Basic Metabolic Panel: Recent Labs    05/25/23 0348 05/26/23 0802  NA 135 134*  K 3.5 3.3*  CL 103 99  CO2 22 25  GLUCOSE 102* 141*  BUN 18 22  CREATININE 0.89 0.95  CALCIUM 8.4* 9.0  MG  --  1.7    CBC: Recent Labs    05/24/23 1332 05/25/23 0348 05/26/23 0802  WBC 15.6* 12.0* 13.2*  NEUTROABS 14.3*  --   --   HGB 12.7* 10.6* 12.0*  HCT 39.9 33.0* 36.0*  MCV 98.0 94.6 92.8  PLT 373 335 354     Imaging: ECHOCARDIOGRAM COMPLETE Result Date: 05/25/2023    ECHOCARDIOGRAM REPORT   Patient Name:   Bruce [name] Date of Exam: 05/25/2023 Medical Rec #:  [id number]      Height:       73.0 in Accession #:    [id number]      Weight:       147.7 lb Date of Birth:  [birth year]      BSA:          1.892 m Patient Age:    78 years       BP:           113/76 mmHg Patient Gender: M              HR:           80 bpm. Exam Location:  Inpatient Procedure: 2D Echo, Cardiac Doppler, Color Doppler and Intracardiac            Opacification Agent (Both Spectral and Color Flow Doppler were            utilized during procedure). Indications:    Pulmonary Embolus I26.09  History:        Patient has prior history of Echocardiogram examinations, most                 recent 01/16/2023. CHF, CAD and Previous Myocardial Infarction,                 COPD, Arrythmias:Atrial Fibrillation, Signs/Symptoms:Syncope;  Risk Factors:Dyslipidemia.  Sonographer:    Lucendia Herrlich RCS Referring Phys: [id number] RAKESH V ALVA  Sonographer Comments: Technically difficult study due to poor echo windows. Image acquisition challenging due to COPD and Image acquisition challenging due to respiratory motion. IMPRESSIONS  1. Left ventricular ejection fraction, by estimation, is 25 to 30%. Left ventricular ejection fraction by PLAX is 25 %. The left ventricle has severely decreased function. The left ventricle demonstrates global hypokinesis. There is mild left ventricular hypertrophy. Left ventricular diastolic parameters are consistent with Grade I diastolic dysfunction (impaired relaxation). There is the interventricular septum is flattened in systole and diastole, consistent with right ventricular pressure and volume overload.  2. Right ventricular systolic function is mildly reduced. The right ventricular size is normal. There is mildly elevated pulmonary artery systolic pressure. The estimated right ventricular systolic pressure is 37.7 mmHg.  3. The mitral valve is abnormal. Trivial mitral valve regurgitation.  4. Tricuspid valve regurgitation is mild to moderate.  5. The aortic valve is tricuspid. Aortic valve regurgitation is not visualized. Aortic valve  sclerosis/calcification is present, without any evidence of aortic stenosis.  6. Aortic dilatation noted. There is borderline dilatation of the aortic root, measuring 38 mm.  7. The inferior vena cava is dilated in size with <50% respiratory variability, suggesting right atrial pressure of 15 mmHg. Comparison(s): Changes from prior study are noted. 01/16/2023: LVEF 40-45%. FINDINGS  Left Ventricle: Left ventricular ejection fraction, by estimation, is 25 to 30%. Left ventricular ejection fraction by PLAX is 25 %. The left ventricle has severely decreased function. The left ventricle demonstrates global hypokinesis. Definity contrast agent was given IV to delineate the left ventricular endocardial borders. The left ventricular internal cavity size was normal in size. There is mild left ventricular hypertrophy. The interventricular septum is flattened in systole and diastole,  consistent with right ventricular pressure and volume overload. Left ventricular diastolic parameters are consistent with Grade I diastolic dysfunction (impaired relaxation). Indeterminate filling pressures. Right Ventricle: The right ventricular size is normal. No increase in right ventricular wall thickness. Right ventricular systolic function is mildly reduced. There is mildly elevated pulmonary artery systolic pressure. The tricuspid regurgitant velocity  is 2.38 m/s, and with an assumed right atrial pressure of 15 mmHg, the estimated right ventricular systolic pressure is 37.7 mmHg. Left Atrium: Left atrial size was normal in size. Right Atrium: Right atrial size was normal in size. Pericardium: There is no evidence of pericardial effusion. Mitral Valve: The mitral valve is abnormal. Mild mitral annular calcification. Trivial mitral valve regurgitation. Tricuspid Valve: The tricuspid valve is grossly normal. Tricuspid valve regurgitation is mild to moderate. Aortic Valve: The aortic valve is tricuspid. Aortic valve regurgitation is not  visualized. Aortic valve sclerosis/calcification is present, without any evidence of aortic stenosis. Aortic valve peak gradient measures 4.2 mmHg. Pulmonic Valve: The pulmonic valve was grossly normal. Pulmonic valve regurgitation is trivial. Aorta: Aortic dilatation noted. There is borderline dilatation of the aortic root, measuring 38 mm. Venous: The inferior vena cava is dilated in size with less than 50% respiratory variability, suggesting right atrial pressure of 15 mmHg. IAS/Shunts: No atrial level shunt detected by color flow Doppler.  LEFT VENTRICLE PLAX 2D LV EF:         Left            Diastology                ventricular     LV e' medial:   3.57 cm/s  ejection        LV E/e' medial: 11.7                fraction by                PLAX is 25                %. LVIDd:         5.10 cm LVIDs:         4.50 cm LV PW:         1.30 cm LV IVS:        1.20 cm LVOT diam:     2.40 cm LV SV:         41 LV SV Index:   22 LVOT Area:     4.52 cm  RIGHT VENTRICLE            IVC RV S prime:     8.35 cm/s  IVC diam: 2.80 cm TAPSE (M-mode): 1.4 cm LEFT ATRIUM             Index        RIGHT ATRIUM           Index LA diam:        3.90 cm 2.06 cm/m   RA Area:     10.87 cm LA Vol (A2C):   31.9 ml 16.86 ml/m  RA Volume:   23.17 ml  12.25 ml/m LA Vol (A4C):   32.0 ml 16.92 ml/m LA Biplane Vol: 34.8 ml 18.40 ml/m  AORTIC VALVE AV Area (Vmax): 2.94 cm AV Vmax:        103.00 cm/s AV Peak Grad:   4.2 mmHg LVOT Vmax:      66.97 cm/s LVOT Vmean:     39.967 cm/s LVOT VTI:       0.092 m  AORTA Ao Root diam: 3.80 cm Ao Asc diam:  3.10 cm MITRAL VALVE               TRICUSPID VALVE MV Area (PHT): 4.31 cm    TR Peak grad:   22.7 mmHg MV Decel Time: 176 msec    TR Vmax:        238.00 cm/s MV E velocity: 41.70 cm/s MV A velocity: 77.30 cm/s  SHUNTS MV E/A ratio:  0.54        Systemic VTI:  0.09 m                            Systemic Diam: 2.40 cm Zoila Shutter MD Electronically signed by Zoila Shutter MD Signature  Date/Time: 05/25/2023/5:16:01 PM    Final    VAS [place] LOWER EXTREMITY VENOUS (DVT) Result Date: 05/25/2023  Lower Venous DVT Study Patient Name:  Bruce [name]  Date of Exam:   05/25/2023 Medical Rec #: [id number]       Accession #:    [id number] Date of Birth: 01/28/1946       Patient Gender: M Patient Age:   5 years Exam Location:  [facility] Procedure:      VAS [place] LOWER EXTREMITY VENOUS (DVT) Referring Phys: [facility] ALVA --------------------------------------------------------------------------------  Indications: Swelling, Edema, SOB, pulmonary embolism, and H/O DVT. Other Indications: Current smoker, H/O colon and lung cancer. Comparison Study: Previous study on 3.24.2010. Performing Technologist: Fernande Bras  Examination Guidelines: A complete evaluation includes B-mode imaging, spectral Doppler, color Doppler, and power Doppler as needed of  all accessible portions of each vessel. Bilateral testing is considered an integral part of a complete examination. Limited examinations for reoccurring indications may be performed as noted. The reflux portion of the exam is performed with the patient in reverse Trendelenburg.  +---------+---------------+---------+-----------+---------------+--------------+ RIGHT    CompressibilityPhasicitySpontaneityProperties     Thrombus Aging +---------+---------------+---------+-----------+---------------+--------------+ CFV      Partial        Yes      Yes        brightly       Chronic                                                    echogenic                     +---------+---------------+---------+-----------+---------------+--------------+ SFJ      Full           Yes      Yes                                      +---------+---------------+---------+-----------+---------------+--------------+ FV Prox  Partial        Yes      Yes        brightly       Chronic                                                    echogenic                      +---------+---------------+---------+-----------+---------------+--------------+ FV Mid   Partial        Yes      Yes                       Age                                                                       Indeterminate  +---------+---------------+---------+-----------+---------------+--------------+ FV DistalPartial        Yes      Yes                       Age                                                                       Indeterminate  +---------+---------------+---------+-----------+---------------+--------------+ PFV      Partial        Yes      Yes        brightly       Chronic  echogenic                     +---------+---------------+---------+-----------+---------------+--------------+ POP      Partial        No       Yes                       Age                                                                       Indeterminate  +---------+---------------+---------+-----------+---------------+--------------+ PTV      Partial        No       No                        Age                                                                       Indeterminate  +---------+---------------+---------+-----------+---------------+--------------+ PERO     Partial        No       No                        Age                                                                       Indeterminate  +---------+---------------+---------+-----------+---------------+--------------+ EIV                     Yes      Yes                                      +---------+---------------+---------+-----------+---------------+--------------+ 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  +---------+---------------+---------+-----------+----------+-----------------+ LEFT     CompressibilityPhasicitySpontaneityPropertiesThrombus Aging     +---------+---------------+---------+-----------+----------+-----------------+ CFV      Partial        Yes      Yes                  Chronic           +---------+---------------+---------+-----------+----------+-----------------+ SFJ      Full           Yes      Yes                                    +---------+---------------+---------+-----------+----------+-----------------+ FV Prox  Full                                                           +---------+---------------+---------+-----------+----------+-----------------+  FV Mid   Partial        Yes      Yes                  Age Indeterminate +---------+---------------+---------+-----------+----------+-----------------+ FV DistalPartial        Yes      Yes                  Age Indeterminate +---------+---------------+---------+-----------+----------+-----------------+ PFV      Partial        Yes      Yes                  Chronic           +---------+---------------+---------+-----------+----------+-----------------+ POP      Partial        Yes      Yes                  Chronic           +---------+---------------+---------+-----------+----------+-----------------+ PTV      Partial        Yes      Yes                  Age Indeterminate +---------+---------------+---------+-----------+----------+-----------------+ PERO     Partial        No       No                   Age Indeterminate +---------+---------------+---------+-----------+----------+-----------------+ SSV      None           No       No                   Acute             +---------+---------------+---------+-----------+----------+-----------------+ EIV                     Yes      Yes                                    +---------+---------------+---------+-----------+----------+-----------------+ 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    Summary: RIGHT: - Findings consistent with age indeterminate deep vein  thrombosis involving the right femoral vein, right popliteal vein, right posterior tibial veins, and right peroneal veins.  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  - No cystic structure found in the popliteal fossa.  LEFT: - Findings consistent with acute superficial vein thrombosis involving the left small saphenous vein.  - Findings consistent with age indeterminate deep vein thrombosis involving the left femoral vein, left popliteal vein, left posterior tibial veins, and left peroneal veins.  - Findings consistent with chronic deep vein thrombosis involving the left common femoral vein, and left proximal profunda vein.  - No cystic structure found in the popliteal fossa.  *See table(s) above for measurements and observations. Electronically signed by Lemar Livings MD on 05/25/2023 at 2:21:30 PM.    Final    DG CHEST PORT 1 VIEW Result Date: 05/25/2023 CLINICAL DATA:  Pleural effusion. EXAM: PORTABLE CHEST 1 VIEW COMPARISON:  May 24, 2023. FINDINGS: Stable cardiomediastinal silhouette. Stable moderate size loculated right pleural effusion is noted with adjacent atelectasis or infiltrate. Left lung is unremarkable. Bony thorax is unremarkable. IMPRESSION: Stable moderate size loculated right  pleural effusion with adjacent right basilar atelectasis or infiltrate. Electronically Signed   By: Lupita Raider M.D.   On: 05/25/2023 09:16   CT Angio Chest/Abd/Pel for Dissection W and/or Wo Contrast Result Date: 05/24/2023 CLINICAL DATA:  Acute aortic syndrome suspected. Concern for pulmonary embolism. Shortness of breath. EXAM: CT ANGIOGRAPHY CHEST, ABDOMEN AND PELVIS TECHNIQUE: Non-contrast CT of the chest was initially obtained. Multidetector CT imaging through the chest, abdomen and pelvis was performed using the standard protocol during bolus administration of intravenous contrast. Multiplanar reconstructed images and  MIPs were obtained and reviewed to evaluate the vascular anatomy. RADIATION DOSE REDUCTION: This exam was performed according to the departmental dose-optimization program which includes automated exposure control, adjustment of the mA and/or kV according to patient size and/or use of iterative reconstruction technique. CONTRAST:  75mL OMNIPAQUE IOHEXOL 350 MG/ML SOLN COMPARISON:  Chest CT dated 04/09/2022. FINDINGS: Evaluation is limited due to streak artifact caused by patient's arms and overlying metallic support apparatus. CTA CHEST FINDINGS Cardiovascular: There is no cardiomegaly. Trace pericardial effusion. There is 3 vessel coronary vascular calcification. Moderate atherosclerotic calcification of the thoracic aorta. No aneurysmal dilatation or dissection. The origins of the great vessels of the aortic arch appear patent. Large pulmonary artery embolus involving the right main pulmonary artery as seen on the prior CT extending into the right middle and right lower lobe branches. Interval resolution of the previously seen left lower lobe pulmonary artery emboli. Small nonocclusive clot in the lingular branch seen Callie decreased since the prior CT. No CT evidence of right heart straining. Mediastinum/Nodes: No hilar or mediastinal adenopathy. The esophagus and the thyroid gland are grossly unremarkable. No mediastinal fluid collection. Lungs/Pleura: Moderate right pleural effusion with partial compressive atelectasis of the right lung. Pneumonia is not excluded. Background of centrilobular emphysema. There are bilateral upper lobe linear atelectasis/scarring. Faint ground-glass nodule in the lingula measures 4 mm. Additional subpleural nodules in the medial left lung base versus areas of scarring measure up to 7 mm. No pneumothorax. The central airways are patent. Musculoskeletal: Degenerative changes of the spine. No acute osseous pathology. Review of the MIP images confirms the above findings. CTA ABDOMEN  AND PELVIS FINDINGS VASCULAR Aorta: Moderate atherosclerotic calcification. No aneurysmal dilatation or dissection. No periaortic fluid collection. Celiac: The celiac trunk and its major branches are patent. SMA: Atherosclerotic calcification of the origin of the SMA. The SMA is patent. Renals: The renal arteries are patent. IMA: The IMA is patent. Inflow: Moderate atherosclerotic calcification of the iliac arteries. There is high-grade narrowing of the right internal iliac artery as seen previously. The external iliac arteries remain patent. Veins: No obvious venous abnormality within the limitations of this arterial phase study. Review of the MIP images confirms the above findings. NON-VASCULAR No intra-abdominal free air or free fluid. Hepatobiliary: The liver is unremarkable.  Cholecystectomy. Pancreas: Unremarkable. No pancreatic ductal dilatation or surrounding inflammatory changes. Spleen: Normal in size without focal abnormality. Adrenals/Urinary Tract: The adrenal glands unremarkable. There is no hydronephrosis on either side. The urinary bladder is grossly unremarkable. Stomach/Bowel: Moderate stool throughout the colon. There is no bowel obstruction. No CT evidence of acute appendicitis. Lymphatic: No adenopathy. Reproductive: Prostate brachytherapy seeds. Other: Diffuse subcutaneous edema.  No fluid collection. Musculoskeletal: Osteopenia with degenerative changes of the spine. No acute osseous pathology. Review of the MIP images confirms the above findings. IMPRESSION: 1. Large pulmonary artery embolus involving the right main pulmonary artery as seen on the prior CT extending into the right middle  and right lower lobe branches. Interval resolution of the previously seen left lower lobe pulmonary artery emboli. No CT evidence of right heart straining. 2. Moderate right pleural effusion with partial compressive atelectasis of the right lung. Pneumonia is not excluded. 3. No acute intra-abdominal or  pelvic pathology. 4. Aortic Atherosclerosis (ICD10-I70.0) and Emphysema (ICD10-J43.9). Electronically Signed   By: Elgie Collard M.D.   On: 05/24/2023 16:48   DG Chest 2 View Result Date: 05/24/2023 CLINICAL DATA:  Shortness of breath. EXAM: CHEST - 2 VIEW COMPARISON:  April 18, 2023. FINDINGS: The heart size and mediastinal contours are within normal limits. Left lung is clear. Small loculated right pleural effusion is noted with associated right basilar atelectasis or inflammation. The visualized skeletal structures are unremarkable. IMPRESSION: Small loculated right pleural effusion is noted with associated right basilar atelectasis or inflammation. Electronically Signed   By: Lupita Raider M.D.   On: 05/24/2023 15:39    Cardiac Studies: ECG: SR rate 93 RVH no acute ST changes   Telemetry: Sinus rhythm with PVCs.  Echo:  November 2024: LVEF 40-45%, global hypokinesis, right ventricular systolic function moderately reduced, size moderately dilated, biatrial enlargement, small pericardial effusion, see report for additional details  March 2025: LVEF 25-30%, right ventricular systolic function mildly reduced, right ventricular size normal, see report  Left and right heart catheterization: February 2024: Minimal CAD, moderate to severe PAH, severe nonischemic cardiomyopathy LVEF <20%    Medications:    budesonide (PULMICORT) nebulizer solution  0.5 mg Nebulization BID   carvedilol  3.125 mg Oral BID WC   doxycycline  100 mg Oral Q12H   furosemide  40 mg Intravenous BID   ipratropium-albuterol  3 mL Nebulization Q6H   losartan  25 mg Oral Daily   potassium chloride  40 mEq Oral Q4H   predniSONE  40 mg Oral Q breakfast   sildenafil  20 mg Oral TID   sodium chloride HYPERTONIC  4 mL Nebulization BID      heparin 1,850 Units/hr (05/26/23 0252)    Assessment/Plan:  Acute biventricular heart failure Cardiomyopathy, 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 twice daily. Continue Lasix 40 mg IV push twice daily. Continue losartan 25 mg p.o. daily. Uptitration of GDMT has been difficult due to soft blood pressures. Continue to monitor. Strict I's and O's and daily weights. Currently on high flow cannula oxygen. Had a heart catheterization February 2024 noted to have minimal CAD. Likely reduction in LVEF seen on recent echocardiogram.  Is secondary to his recent viral illness with influenza A in February 2025, recurrent PE, etc.  Acute pulmonary embolism: Recurrent. Most recent CT notes large right main pulmonary artery clot burden. Per EMR and not compliant with DOAC. Currently on IV heparin drip. Will need to be transition to oral anticoagulation based on his coverage closer to discharge.  Reemphasized importance of medication compliance.  Bilateral DVT: Right lower extremity age-indeterminate DVT as well as chronic DVT Left lower extremity acute superficial vein thrombosis as well as age-indeterminate DVT Currently on IV heparin Recommend outpatient follow-up with PCP for age-appropriate cancer screening  Severe emphysema: Per EMR patient reported to be smoking; however, patient tells me that he has stopped. Requires 3 to 4 L nasal cannula oxygen at home.  Severe pulmonary hypertension: Had undergone right heart catheterization with Dr. Gala Romney in February 2024. Was on Revatio as outpatient the compliance questionable  Noncompliance  Overall prognosis is quite guarded. Agree with palliative care consult to  help with goals of care discussions. Will continue to diurese the patient and uptitrate GDMT.  Tessa Lerner, DO, [facility]   [facility]  [address] [place], [place] [address]

## 2023-05-26 NOTE — Progress Notes (Signed)
 PROGRESS NOTE    Bruce [name]  MWU:[id number] DOB: 1945/07/19 DOA: 05/24/2023 PCP: Miki Kins, FNP   Brief Narrative:  78 year old male with history of COPD, chronic respiratory failure with hypoxia on 3 L oxygen via nasal cannula chronically, bilateral PE in February 2024 status post thrombolysis, nonobstructive coronary artery disease (as per cardiac catheterization in February 2024), paroxysmal A-fib, pulmonary hypertension, chronic systolic heart failure, medication noncompliance and continued tobacco abuse presented with worsening shortness of breath and chest discomfort.  On presentation, CTA chest showed right mainstem PE.  He was found to have elevated high-sensitivity troponin of 721 and 852 and elevated BNP of 2431.6 he was started on heparin drip and IV Lasix.  Pulmonary, palliative care and cardiology were consulted.  Assessment & Plan:   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sm, right main pulmonary arterial thrombus Noncompliance with anticoagulation Bilateral lower extremity chronic DVTs -Continue heparin drip.  Pulmonary following.  Not a candidate for thrombectomy.  No evidence of right heart strain on CT. -lower extremity duplex showed bilateral lower extremity chronic DVTs. -2D echo showed EF of 25 to 30% with grade 1 diastolic dysfunction  Severe pulmonary hypertension -Noncompliant with Eliquis and sildenafil.  Sildenafil has been resumed inpatient by cardiology.  Acute on chronic respiratory failure with hypoxia Severe COPD Ongoing tobacco abuse -Uses 3 L oxygen via nasal cannula at home.   -Respiratory status has worsened.  Currently on 30 L/min high flow nasal cannula oxygen. -Counseled regarding tobacco cessation by admitting hospitalist -Continue current nebs along with prednisone.  Pulmonary following.  Right pleural effusion -Continue diuretics for now.  Pulmonary might perform thoracentesis today.  Acute on chronic combined CHF Positive troponins:  Possibly from demand ischemia from above -Patient is noncompliant with his medications.  Presented with volume overload and elevated BNP. -Cardiology following.  2D echo as above.  Continue IV Lasix.  Continue Coreg and losartan.  Strict input and output.  Daily weights.  Fluid restriction  Paroxysmal A-fib -Currently rate controlled.  Not taking amiodarone, digoxin or Eliquis at home.  Leukocytosis -Labs pending today.  Monitor  Goals of care Physical deconditioning -Overall prognosis is guarded to poor because of patient's noncompliance and continued tobacco abuse.  Palliative care consultation pending: Follow recommendations.    DVT prophylaxis: Heparin drip Code Status: DNR Family Communication: None at bedside Disposition Plan: Status is: Inpatient Remains inpatient appropriate because: Of severity of illness    Consultants: Pulmonary/cardiology/palliative care  Procedures: None  Antimicrobials: None   Subjective: Patient seen and examined at bedside.  Remains short of breath with minimal exertion.  Denies any fever, vomiting or chest pain.  Complains of lower extremity pain.  Objective: Vitals:   05/25/23 2353 05/25/23 2354 05/26/23 0105 05/26/23 0425  BP:    101/63  Pulse: 81 79 85 66  Resp: 20 20 (!) 23 18  Temp:    97.6 F (36.4 C)  TempSrc:    Oral  SpO2: 92% 92% 91% 99%  Weight:    69.2 kg  Height:        Intake/Output Summary (Last 24 hours) at 05/26/2023 0728 Last data filed at 05/25/2023 1907 Gross per 24 hour  Intake 1286.8 ml  Output 2125 ml  Net -838.2 ml   Filed Weights   05/24/23 1325 05/25/23 1312 05/26/23 0425  Weight: 72.6 kg 67 kg 69.2 kg    Examination:  General: On high flow nasal cannula oxygen.  No distress.  Chronically ill and deconditioned looking. ENT/neck: No thyromegaly.  JVD is not elevated  respiratory: Decreased breath sounds at bases bilaterally with some crackles; no wheezing  CVS: S1-S2 heard, rate controlled  currently Abdominal: Soft, nontender, slightly distended; no organomegaly, bowel sounds are heard Extremities: Trace lower extremity edema; no cyanosis  CNS: Awake and alert.  Slow to respond.  Poor stream.  No focal neurologic deficit.  Moves extremities Lymph: No obvious lymphadenopathy Skin: No obvious ecchymosis/lesions  psych: Mostly flat affect.  Not agitated currently. musculoskeletal: No obvious joint swelling/deformity     Data Reviewed: I have personally reviewed following labs and imaging studies  CBC: Recent Labs  Lab 05/24/23 1332 05/25/23 0348  WBC 15.6* 12.0*  NEUTROABS 14.3*  --   HGB 12.7* 10.6*  HCT 39.9 33.0*  MCV 98.0 94.6  PLT 373 335   Basic Metabolic Panel: Recent Labs  Lab 05/24/23 1332 05/25/23 0348  NA 137 135  K 4.0 3.5  CL 105 103  CO2 21* 22  GLUCOSE 152* 102*  BUN 21 18  CREATININE 0.92 0.89  CALCIUM 9.1 8.4*   GFR: Estimated Creatinine Clearance: 70.2 mL/min (by C-G formula based on SCr of 0.89 mg/dL). Liver Function Tests: No results for input(s): "AST", "ALT", "ALKPHOS", "BILITOT", "PROT", "ALBUMIN" in the last 168 hours. No results for input(s): "LIPASE", "AMYLASE" in the last 168 hours. No results for input(s): "AMMONIA" in the last 168 hours. Coagulation Profile: Recent Labs  Lab 05/24/23 1706  INR 1.5*   Cardiac Enzymes: No results for input(s): "CKTOTAL", "CKMB", "CKMBINDEX", "TROPONINI" in the last 168 hours. BNP (last 3 results) No results for input(s): "PROBNP" in the last 8760 hours. HbA1C: No results for input(s): "HGBA1C" in the last 72 hours. CBG: No results for input(s): "GLUCAP" in the last 168 hours. Lipid Profile: No results for input(s): "CHOL", "HDL", "LDLCALC", "TRIG", "CHOLHDL", "LDLDIRECT" in the last 72 hours. Thyroid Function Tests: No results for input(s): "TSH", "T4TOTAL", "FREET4", "T3FREE", "THYROIDAB" in the last 72 hours. Anemia Panel: No results for input(s): "VITAMINB12", "FOLATE",  "FERRITIN", "TIBC", "IRON", "RETICCTPCT" in the last 72 hours. Sepsis Labs: Recent Labs  Lab 05/24/23 1833  LATICACIDVEN 1.7    No results found for this or any previous visit (from the past 240 hours).       Radiology Studies: ECHOCARDIOGRAM COMPLETE Result Date: 05/25/2023    ECHOCARDIOGRAM REPORT   Patient Name:   JUNAID WURZER Date of Exam: 05/25/2023 Medical Rec #:  [id number]      Height:       73.0 in Accession #:    [id number]     Weight:       147.7 lb Date of Birth:  Jul 09, 1945      BSA:          1.892 m Patient Age:    77 years       BP:           113/76 mmHg Patient Gender: M              HR:           80 bpm. Exam Location:  Inpatient Procedure: 2D Echo, Cardiac Doppler, Color Doppler and Intracardiac            Opacification Agent (Both Spectral and Color Flow Doppler were            utilized during procedure). Indications:    Pulmonary Embolus I26.09  History:        Patient has prior history of Echocardiogram examinations, most  recent 01/16/2023. CHF, CAD and Previous Myocardial Infarction,                 COPD, Arrythmias:Atrial Fibrillation, Signs/Symptoms:Syncope;                 Risk Factors:Dyslipidemia.  Sonographer:    Lucendia Herrlich RCS Referring Phys: [phone] RAKESH V ALVA  Sonographer Comments: Technically difficult study due to poor echo windows. Image acquisition challenging due to COPD and Image acquisition challenging due to respiratory motion. IMPRESSIONS  1. Left ventricular ejection fraction, by estimation, is 25 to 30%. Left ventricular ejection fraction by PLAX is 25 %. The left ventricle has severely decreased function. The left ventricle demonstrates global hypokinesis. There is mild left ventricular hypertrophy. Left ventricular diastolic parameters are consistent with Grade I diastolic dysfunction (impaired relaxation). There is the interventricular septum is flattened in systole and diastole, consistent with right ventricular pressure and volume  overload.  2. Right ventricular systolic function is mildly reduced. The right ventricular size is normal. There is mildly elevated pulmonary artery systolic pressure. The estimated right ventricular systolic pressure is 37.7 mmHg.  3. The mitral valve is abnormal. Trivial mitral valve regurgitation.  4. Tricuspid valve regurgitation is mild to moderate.  5. The aortic valve is tricuspid. Aortic valve regurgitation is not visualized. Aortic valve sclerosis/calcification is present, without any evidence of aortic stenosis.  6. Aortic dilatation noted. There is borderline dilatation of the aortic root, measuring 38 mm.  7. The inferior vena cava is dilated in size with <50% respiratory variability, suggesting right atrial pressure of 15 mmHg. Comparison(s): Changes from prior study are noted. 01/16/2023: LVEF 40-45%. FINDINGS  Left Ventricle: Left ventricular ejection fraction, by estimation, is 25 to 30%. Left ventricular ejection fraction by PLAX is 25 %. The left ventricle has severely decreased function. The left ventricle demonstrates global hypokinesis. Definity contrast agent was given IV to delineate the left ventricular endocardial borders. The left ventricular internal cavity size was normal in size. There is mild left ventricular hypertrophy. The interventricular septum is flattened in systole and diastole,  consistent with right ventricular pressure and volume overload. Left ventricular diastolic parameters are consistent with Grade I diastolic dysfunction (impaired relaxation). Indeterminate filling pressures. Right Ventricle: The right ventricular size is normal. No increase in right ventricular wall thickness. Right ventricular systolic function is mildly reduced. There is mildly elevated pulmonary artery systolic pressure. The tricuspid regurgitant velocity  is 2.38 m/s, and with an assumed right atrial pressure of 15 mmHg, the estimated right ventricular systolic pressure is 37.7 mmHg. Left Atrium:  Left atrial size was normal in size. Right Atrium: Right atrial size was normal in size. Pericardium: There is no evidence of pericardial effusion. Mitral Valve: The mitral valve is abnormal. Mild mitral annular calcification. Trivial mitral valve regurgitation. Tricuspid Valve: The tricuspid valve is grossly normal. Tricuspid valve regurgitation is mild to moderate. Aortic Valve: The aortic valve is tricuspid. Aortic valve regurgitation is not visualized. Aortic valve sclerosis/calcification is present, without any evidence of aortic stenosis. Aortic valve peak gradient measures 4.2 mmHg. Pulmonic Valve: The pulmonic valve was grossly normal. Pulmonic valve regurgitation is trivial. Aorta: Aortic dilatation noted. There is borderline dilatation of the aortic root, measuring 38 mm. Venous: The inferior vena cava is dilated in size with less than 50% respiratory variability, suggesting right atrial pressure of 15 mmHg. IAS/Shunts: No atrial level shunt detected by color flow Doppler.  LEFT VENTRICLE PLAX 2D LV EF:  Left            Diastology                ventricular     LV e' medial:   3.57 cm/s                ejection        LV E/e' medial: 11.7                fraction by                PLAX is 25                %. LVIDd:         5.10 cm LVIDs:         4.50 cm LV PW:         1.30 cm LV IVS:        1.20 cm LVOT diam:     2.40 cm LV SV:         41 LV SV Index:   22 LVOT Area:     4.52 cm  RIGHT VENTRICLE            IVC RV S prime:     8.35 cm/s  IVC diam: 2.80 cm TAPSE (M-mode): 1.4 cm LEFT ATRIUM             Index        RIGHT ATRIUM           Index LA diam:        3.90 cm 2.06 cm/m   RA Area:     10.87 cm LA Vol (A2C):   31.9 ml 16.86 ml/m  RA Volume:   23.17 ml  12.25 ml/m LA Vol (A4C):   32.0 ml 16.92 ml/m LA Biplane Vol: 34.8 ml 18.40 ml/m  AORTIC VALVE AV Area (Vmax): 2.94 cm AV Vmax:        103.00 cm/s AV Peak Grad:   4.2 mmHg LVOT Vmax:      66.97 cm/s LVOT Vmean:     39.967 cm/s LVOT VTI:        0.092 m  AORTA Ao Root diam: 3.80 cm Ao Asc diam:  3.10 cm MITRAL VALVE               TRICUSPID VALVE MV Area (PHT): 4.31 cm    TR Peak grad:   22.7 mmHg MV Decel Time: 176 msec    TR Vmax:        238.00 cm/s MV E velocity: 41.70 cm/s MV A velocity: 77.30 cm/s  SHUNTS MV E/A ratio:  0.54        Systemic VTI:  0.09 m                            Systemic Diam: 2.40 cm Zoila Shutter MD Electronically signed by Zoila Shutter MD Signature Date/Time: 05/25/2023/5:16:01 PM    Final    VAS [place] LOWER EXTREMITY VENOUS (DVT) Result Date: 05/25/2023  Lower Venous DVT Study Patient Name:  ESLEY BROOKING  Date of Exam:   05/25/2023 Medical Rec #: [id number]       Accession #:    [id number] Date of Birth: [birth year]       Patient Gender: M Patient Age:   69 years Exam Location:  [facility] Procedure:      VAS [place] LOWER EXTREMITY VENOUS (  DVT) Referring Phys: [facility] ALVA --------------------------------------------------------------------------------  Indications: Swelling, Edema, SOB, pulmonary embolism, and H/O DVT. Other Indications: Current smoker, H/O colon and lung cancer. Comparison Study: Previous study on 3.24.2010. Performing Technologist: Fernande Bras  Examination Guidelines: A complete evaluation includes B-mode imaging, spectral Doppler, color Doppler, and power Doppler as needed of all accessible portions of each vessel. Bilateral testing is considered an integral part of a complete examination. Limited examinations for reoccurring indications may be performed as noted. The reflux portion of the exam is performed with the patient in reverse Trendelenburg.  +---------+---------------+---------+-----------+---------------+--------------+ RIGHT    CompressibilityPhasicitySpontaneityProperties     Thrombus Aging +---------+---------------+---------+-----------+---------------+--------------+ CFV      Partial        Yes      Yes        brightly       Chronic                                                     echogenic                     +---------+---------------+---------+-----------+---------------+--------------+ SFJ      Full           Yes      Yes                                      +---------+---------------+---------+-----------+---------------+--------------+ FV Prox  Partial        Yes      Yes        brightly       Chronic                                                    echogenic                     +---------+---------------+---------+-----------+---------------+--------------+ FV Mid   Partial        Yes      Yes                       Age                                                                       Indeterminate  +---------+---------------+---------+-----------+---------------+--------------+ FV DistalPartial        Yes      Yes                       Age  Indeterminate  +---------+---------------+---------+-----------+---------------+--------------+ PFV      Partial        Yes      Yes        brightly       Chronic                                                    echogenic                     +---------+---------------+---------+-----------+---------------+--------------+ POP      Partial        No       Yes                       Age                                                                       Indeterminate  +---------+---------------+---------+-----------+---------------+--------------+ PTV      Partial        No       No                        Age                                                                       Indeterminate  +---------+---------------+---------+-----------+---------------+--------------+ PERO     Partial        No       No                        Age                                                                       Indeterminate   +---------+---------------+---------+-----------+---------------+--------------+ EIV                     Yes      Yes                                      +---------+---------------+---------+-----------+---------------+--------------+ 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  +---------+---------------+---------+-----------+----------+-----------------+ LEFT     CompressibilityPhasicitySpontaneityPropertiesThrombus Aging    +---------+---------------+---------+-----------+----------+-----------------+ CFV      Partial        Yes      Yes                  Chronic           +---------+---------------+---------+-----------+----------+-----------------+  SFJ      Full           Yes      Yes                                    +---------+---------------+---------+-----------+----------+-----------------+ FV Prox  Full                                                           +---------+---------------+---------+-----------+----------+-----------------+ FV Mid   Partial        Yes      Yes                  Age Indeterminate +---------+---------------+---------+-----------+----------+-----------------+ FV DistalPartial        Yes      Yes                  Age Indeterminate +---------+---------------+---------+-----------+----------+-----------------+ PFV      Partial        Yes      Yes                  Chronic           +---------+---------------+---------+-----------+----------+-----------------+ POP      Partial        Yes      Yes                  Chronic           +---------+---------------+---------+-----------+----------+-----------------+ PTV      Partial        Yes      Yes                  Age Indeterminate +---------+---------------+---------+-----------+----------+-----------------+ PERO     Partial        No       No                   Age Indeterminate  +---------+---------------+---------+-----------+----------+-----------------+ SSV      None           No       No                   Acute             +---------+---------------+---------+-----------+----------+-----------------+ EIV                     Yes      Yes                                    +---------+---------------+---------+-----------+----------+-----------------+ 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    Summary: RIGHT: - Findings consistent with age indeterminate deep vein thrombosis involving the right femoral vein, right popliteal vein, right posterior tibial veins, and right peroneal veins.  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  - No cystic structure found in the popliteal fossa.  LEFT: - Findings consistent with acute superficial vein thrombosis involving the left small saphenous vein.  - Findings consistent with age indeterminate deep vein thrombosis involving the left femoral  vein, left popliteal vein, left posterior tibial veins, and left peroneal veins.  - Findings consistent with chronic deep vein thrombosis involving the left common femoral vein, and left proximal profunda vein.  - No cystic structure found in the popliteal fossa.  *See table(s) above for measurements and observations. Electronically signed by Lemar Livings MD on 05/25/2023 at 2:21:30 PM.    Final    DG CHEST PORT 1 VIEW Result Date: 05/25/2023 CLINICAL DATA:  Pleural effusion. EXAM: PORTABLE CHEST 1 VIEW COMPARISON:  May 24, 2023. FINDINGS: Stable cardiomediastinal silhouette. Stable moderate size loculated right pleural effusion is noted with adjacent atelectasis or infiltrate. Left lung is unremarkable. Bony thorax is unremarkable. IMPRESSION: Stable moderate size loculated right pleural effusion with adjacent right basilar atelectasis or infiltrate. Electronically Signed   By: Lupita Raider  M.D.   On: 05/25/2023 09:16   CT Angio Chest/Abd/Pel for Dissection W and/or Wo Contrast Result Date: 05/24/2023 CLINICAL DATA:  Acute aortic syndrome suspected. Concern for pulmonary embolism. Shortness of breath. EXAM: CT ANGIOGRAPHY CHEST, ABDOMEN AND PELVIS TECHNIQUE: Non-contrast CT of the chest was initially obtained. Multidetector CT imaging through the chest, abdomen and pelvis was performed using the standard protocol during bolus administration of intravenous contrast. Multiplanar reconstructed images and MIPs were obtained and reviewed to evaluate the vascular anatomy. RADIATION DOSE REDUCTION: This exam was performed according to the departmental dose-optimization program which includes automated exposure control, adjustment of the mA and/or kV according to patient size and/or use of iterative reconstruction technique. CONTRAST:  75mL OMNIPAQUE IOHEXOL 350 MG/ML SOLN COMPARISON:  Chest CT dated 04/09/2022. FINDINGS: Evaluation is limited due to streak artifact caused by patient's arms and overlying metallic support apparatus. CTA CHEST FINDINGS Cardiovascular: There is no cardiomegaly. Trace pericardial effusion. There is 3 vessel coronary vascular calcification. Moderate atherosclerotic calcification of the thoracic aorta. No aneurysmal dilatation or dissection. The origins of the great vessels of the aortic arch appear patent. Large pulmonary artery embolus involving the right main pulmonary artery as seen on the prior CT extending into the right middle and right lower lobe branches. Interval resolution of the previously seen left lower lobe pulmonary artery emboli. Small nonocclusive clot in the lingular branch seen Callie decreased since the prior CT. No CT evidence of right heart straining. Mediastinum/Nodes: No hilar or mediastinal adenopathy. The esophagus and the thyroid gland are grossly unremarkable. No mediastinal fluid collection. Lungs/Pleura: Moderate right pleural effusion with partial  compressive atelectasis of the right lung. Pneumonia is not excluded. Background of centrilobular emphysema. There are bilateral upper lobe linear atelectasis/scarring. Faint ground-glass nodule in the lingula measures 4 mm. Additional subpleural nodules in the medial left lung base versus areas of scarring measure up to 7 mm. No pneumothorax. The central airways are patent. Musculoskeletal: Degenerative changes of the spine. No acute osseous pathology. Review of the MIP images confirms the above findings. CTA ABDOMEN AND PELVIS FINDINGS VASCULAR Aorta: Moderate atherosclerotic calcification. No aneurysmal dilatation or dissection. No periaortic fluid collection. Celiac: The celiac trunk and its major branches are patent. SMA: Atherosclerotic calcification of the origin of the SMA. The SMA is patent. Renals: The renal arteries are patent. IMA: The IMA is patent. Inflow: Moderate atherosclerotic calcification of the iliac arteries. There is high-grade narrowing of the right internal iliac artery as seen previously. The external iliac arteries remain patent. Veins: No obvious venous abnormality within the limitations of this arterial phase study. Review of the MIP images confirms the above findings. NON-VASCULAR No intra-abdominal free  air or free fluid. Hepatobiliary: The liver is unremarkable.  Cholecystectomy. Pancreas: Unremarkable. No pancreatic ductal dilatation or surrounding inflammatory changes. Spleen: Normal in size without focal abnormality. Adrenals/Urinary Tract: The adrenal glands unremarkable. There is no hydronephrosis on either side. The urinary bladder is grossly unremarkable. Stomach/Bowel: Moderate stool throughout the colon. There is no bowel obstruction. No CT evidence of acute appendicitis. Lymphatic: No adenopathy. Reproductive: Prostate brachytherapy seeds. Other: Diffuse subcutaneous edema.  No fluid collection. Musculoskeletal: Osteopenia with degenerative changes of the spine. No acute  osseous pathology. Review of the MIP images confirms the above findings. IMPRESSION: 1. Large pulmonary artery embolus involving the right main pulmonary artery as seen on the prior CT extending into the right middle and right lower lobe branches. Interval resolution of the previously seen left lower lobe pulmonary artery emboli. No CT evidence of right heart straining. 2. Moderate right pleural effusion with partial compressive atelectasis of the right lung. Pneumonia is not excluded. 3. No acute intra-abdominal or pelvic pathology. 4. Aortic Atherosclerosis (ICD10-I70.0) and Emphysema (ICD10-J43.9). Electronically Signed   By: Elgie Collard M.D.   On: 05/24/2023 16:48   DG Chest 2 View Result Date: 05/24/2023 CLINICAL DATA:  Shortness of breath. EXAM: CHEST - 2 VIEW COMPARISON:  April 18, 2023. FINDINGS: The heart size and mediastinal contours are within normal limits. Left lung is clear. Small loculated right pleural effusion is noted with associated right basilar atelectasis or inflammation. The visualized skeletal structures are unremarkable. IMPRESSION: Small loculated right pleural effusion is noted with associated right basilar atelectasis or inflammation. Electronically Signed   By: Lupita Raider M.D.   On: 05/24/2023 15:39        Scheduled Meds:  budesonide (PULMICORT) nebulizer solution  0.5 mg Nebulization BID   carvedilol  3.125 mg Oral BID WC   doxycycline  100 mg Oral Q12H   furosemide  40 mg Intravenous BID   ipratropium-albuterol  3 mL Nebulization Q6H   losartan  25 mg Oral Daily   predniSONE  40 mg Oral Q breakfast   sildenafil  20 mg Oral TID   sodium chloride HYPERTONIC  4 mL Nebulization BID   Continuous Infusions:  heparin 1,850 Units/hr (05/26/23 0252)          Glade Lloyd, MD [facility] 05/26/2023, 7:28 AM

## 2023-05-27 ENCOUNTER — Inpatient Hospital Stay (HOSPITAL_COMMUNITY)

## 2023-05-27 DIAGNOSIS — J9621 Acute and chronic respiratory failure with hypoxia: Secondary | ICD-10-CM | POA: Diagnosis not present

## 2023-05-27 DIAGNOSIS — I5082 Biventricular heart failure: Secondary | ICD-10-CM | POA: Diagnosis not present

## 2023-05-27 DIAGNOSIS — J441 Chronic obstructive pulmonary disease with (acute) exacerbation: Secondary | ICD-10-CM | POA: Diagnosis not present

## 2023-05-27 DIAGNOSIS — I2721 Secondary pulmonary arterial hypertension: Secondary | ICD-10-CM

## 2023-05-27 DIAGNOSIS — I5043 Acute on chronic combined systolic (congestive) and diastolic (congestive) heart failure: Secondary | ICD-10-CM | POA: Diagnosis not present

## 2023-05-27 DIAGNOSIS — I2699 Other pulmonary embolism without acute cor pulmonale: Secondary | ICD-10-CM | POA: Diagnosis not present

## 2023-05-27 DIAGNOSIS — J90 Pleural effusion, not elsewhere classified: Secondary | ICD-10-CM | POA: Diagnosis not present

## 2023-05-27 DIAGNOSIS — I825Y3 Chronic embolism and thrombosis of unspecified deep veins of proximal lower extremity, bilateral: Secondary | ICD-10-CM

## 2023-05-27 DIAGNOSIS — I428 Other cardiomyopathies: Secondary | ICD-10-CM | POA: Diagnosis not present

## 2023-05-27 LAB — CBC WITH DIFFERENTIAL/PLATELET
Abs Immature Granulocytes: 0.07 10*3/uL (ref 0.00–0.07)
Basophils Absolute: 0 10*3/uL (ref 0.0–0.1)
Basophils Relative: 0 %
Eosinophils Absolute: 0 10*3/uL (ref 0.0–0.5)
Eosinophils Relative: 0 %
HCT: 33.1 % — ABNORMAL LOW (ref 39.0–52.0)
Hemoglobin: 10.8 g/dL — ABNORMAL LOW (ref 13.0–17.0)
Immature Granulocytes: 1 %
Lymphocytes Relative: 5 %
Lymphs Abs: 0.8 10*3/uL (ref 0.7–4.0)
MCH: 30.6 pg (ref 26.0–34.0)
MCHC: 32.6 g/dL (ref 30.0–36.0)
MCV: 93.8 fL (ref 80.0–100.0)
Monocytes Absolute: 0.6 10*3/uL (ref 0.1–1.0)
Monocytes Relative: 4 %
Neutro Abs: 13.5 10*3/uL — ABNORMAL HIGH (ref 1.7–7.7)
Neutrophils Relative %: 90 %
Platelets: 335 10*3/uL (ref 150–400)
RBC: 3.53 MIL/uL — ABNORMAL LOW (ref 4.22–5.81)
RDW: 14.8 % (ref 11.5–15.5)
WBC: 15 10*3/uL — ABNORMAL HIGH (ref 4.0–10.5)
nRBC: 0 % (ref 0.0–0.2)

## 2023-05-27 LAB — BASIC METABOLIC PANEL
Anion gap: 8 (ref 5–15)
BUN: 25 mg/dL — ABNORMAL HIGH (ref 8–23)
CO2: 25 mmol/L (ref 22–32)
Calcium: 9.2 mg/dL (ref 8.9–10.3)
Chloride: 101 mmol/L (ref 98–111)
Creatinine, Ser: 1.05 mg/dL (ref 0.61–1.24)
GFR, Estimated: >60 mL/min (ref >60)
Glucose, Bld: 135 mg/dL — ABNORMAL HIGH (ref 70–99)
Potassium: 4 mmol/L (ref 3.5–5.1)
Sodium: 134 mmol/L — ABNORMAL LOW (ref 135–145)

## 2023-05-27 LAB — MAGNESIUM: Magnesium: 1.8 mg/dL (ref 1.7–2.4)

## 2023-05-27 LAB — HEPARIN LEVEL (UNFRACTIONATED): Heparin Unfractionated: 0.5 [IU]/mL (ref 0.30–0.70)

## 2023-05-27 NOTE — Progress Notes (Signed)
 PROGRESS NOTE    HARNOOR KOHLES  ZOX:[id number] DOB: January 06, 1946 DOA: 05/24/2023 PCP: Miki Kins, FNP   Brief Narrative:  78 year old male with history of COPD, chronic respiratory failure with hypoxia on 3 L oxygen via nasal cannula chronically, bilateral PE in February 2024 status post thrombolysis, nonobstructive coronary artery disease (as per cardiac catheterization in February 2024), paroxysmal A-fib, pulmonary hypertension, chronic systolic heart failure, medication noncompliance and continued tobacco abuse presented with worsening shortness of breath and chest discomfort.  On presentation, CTA chest showed right mainstem PE.  He was found to have elevated high-sensitivity troponin of 721 and 852 and elevated BNP of 2431.6 he was started on heparin drip and IV Lasix.  Pulmonary, palliative care and cardiology were consulted.  Assessment & Plan:   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sm, right main pulmonary arterial thrombus Noncompliance with anticoagulation Bilateral lower extremity chronic DVTs -Continue heparin drip.  Pulmonary following.  Not a candidate for thrombectomy.  No evidence of right heart strain on CT. -lower extremity duplex showed bilateral lower extremity chronic DVTs. -2D echo showed EF of 25 to 30% with grade 1 diastolic dysfunction  Severe pulmonary hypertension -Noncompliant with Eliquis and sildenafil.  Sildenafil has been resumed inpatient by cardiology.  Acute on chronic respiratory failure with hypoxia Severe COPD Ongoing tobacco abuse -Uses 3 L oxygen via nasal cannula at home.   -Respiratory status has worsened.  Currently still on 30 L/min high flow nasal cannula oxygen. -Counseled regarding tobacco cessation by admitting hospitalist -Continue current nebs along with prednisone.  Pulmonary following.  Right pleural effusion, lobulated -Continue diuretics for now.  Pulmonary following: Patient declined thoracentesis on 05/26/2023.  Acute on chronic  combined CHF Positive troponins: Possibly from demand ischemia from above -Patient is noncompliant with his medications.  Presented with volume overload and elevated BNP. -Cardiology following.  2D echo as above.  Continue IV Lasix.  Continue Coreg and losartan.  Strict input and output.  Daily weights.  Fluid restriction  Hyponatremia -Mild.  Monitor  Hypokalemia -Resolved  Paroxysmal A-fib -Currently rate controlled.  Not taking amiodarone, digoxin or Eliquis at home.  Leukocytosis -Monitor  Anemia of chronic disease -From chronic illnesses.  Monitor intermittently.  No signs of bleeding  Goals of care Physical deconditioning -Overall prognosis is guarded to poor because of patient's noncompliance and continued tobacco abuse.  Palliative care consultation following.  DVT prophylaxis: Heparin drip Code Status: DNR Family Communication: None at bedside Disposition Plan: Status is: Inpatient Remains inpatient appropriate because: Of severity of illness    Consultants: Pulmonary/cardiology/palliative care  Procedures: None  Antimicrobials: None   Subjective: Patient seen and examined at bedside.  Still short of breath with slight exertion.  No chest pain, seizures, fever, vomiting reported. Objective: Vitals:   05/27/23 0138 05/27/23 0214 05/27/23 0500 05/27/23 0547  BP: 105/67   108/67  Pulse: 75  73 80  Resp:    18  Temp: 97.9 F (36.6 C)   (!) 97.4 F (36.3 C)  TempSrc: Oral   Oral  SpO2: 96% 95% 94% 94%  Weight:    69.4 kg  Height:        Intake/Output Summary (Last 24 hours) at 05/27/2023 0723 Last data filed at 05/27/2023 0700 Gross per 24 hour  Intake 1186.47 ml  Output 1500 ml  Net -313.53 ml   Filed Weights   05/25/23 1312 05/26/23 0425 05/27/23 0547  Weight: 67 kg 69.2 kg 69.4 kg    Examination:  General: No acute  distress.  Remains on high flow nasal cannula oxygen.  Chronically ill and deconditioned looking. ENT/neck: No JVD elevation or  palpable neck masses noted respiratory: Bilateral decreased breath sounds at bases with scattered crackles CVS: Rate mostly controlled; S1 and S2 are heard  abdominal: Soft, nontender, remains distended; no organomegaly, bowel sounds are heard normally Extremities: No clubbing; mild lower extremity edema present CNS: Alert; still slow to respond and a poor historian.  No focal neurologic deficit.  Able to move extremities Lymph: No obvious palpable lymphadenopathy Skin: No obvious rashes/petechiae psych: Currently not agitated.  Flat affect currently musculoskeletal: No obvious joint tenderness/deformity    Data Reviewed: I have personally reviewed following labs and imaging studies  CBC: Recent Labs  Lab 05/24/23 1332 05/25/23 0348 05/26/23 0802 05/27/23 0230  WBC 15.6* 12.0* 13.2* 15.0*  NEUTROABS 14.3*  --   --  13.5*  HGB 12.7* 10.6* 12.0* 10.8*  HCT 39.9 33.0* 36.0* 33.1*  MCV 98.0 94.6 92.8 93.8  PLT 373 335 354 335   Basic Metabolic Panel: Recent Labs  Lab 05/24/23 1332 05/25/23 0348 05/26/23 0802 05/27/23 0230  NA 137 135 134* 134*  K 4.0 3.5 3.3* 4.0  CL 105 103 99 101  CO2 21* 22 25 25   GLUCOSE 152* 102* 141* 135*  BUN 21 18 22  25*  CREATININE 0.92 0.89 0.95 1.05  CALCIUM 9.1 8.4* 9.0 9.2  MG  --   --  1.7 1.8   GFR: Estimated Creatinine Clearance: 59.7 mL/min (by C-G formula based on SCr of 1.05 mg/dL). Liver Function Tests: No results for input(s): "AST", "ALT", "ALKPHOS", "BILITOT", "PROT", "ALBUMIN" in the last 168 hours. No results for input(s): "LIPASE", "AMYLASE" in the last 168 hours. No results for input(s): "AMMONIA" in the last 168 hours. Coagulation Profile: Recent Labs  Lab 05/24/23 1706  INR 1.5*   Cardiac Enzymes: No results for input(s): "CKTOTAL", "CKMB", "CKMBINDEX", "TROPONINI" in the last 168 hours. BNP (last 3 results) No results for input(s): "PROBNP" in the last 8760 hours. HbA1C: No results for input(s): "HGBA1C" in the  last 72 hours. CBG: No results for input(s): "GLUCAP" in the last 168 hours. Lipid Profile: No results for input(s): "CHOL", "HDL", "LDLCALC", "TRIG", "CHOLHDL", "LDLDIRECT" in the last 72 hours. Thyroid Function Tests: No results for input(s): "TSH", "T4TOTAL", "FREET4", "T3FREE", "THYROIDAB" in the last 72 hours. Anemia Panel: No results for input(s): "VITAMINB12", "FOLATE", "FERRITIN", "TIBC", "IRON", "RETICCTPCT" in the last 72 hours. Sepsis Labs: Recent Labs  Lab 05/24/23 1833  LATICACIDVEN 1.7    No results found for this or any previous visit (from the past 240 hours).       Radiology Studies: ECHOCARDIOGRAM COMPLETE Result Date: 05/25/2023    ECHOCARDIOGRAM REPORT   Patient Name:   DOREAN DANIELLO Date of Exam: 05/25/2023 Medical Rec #:  [id number]      Height:       73.0 in Accession #:    [id number]     Weight:       147.7 lb Date of Birth:  20-Jul-1945      BSA:          1.892 m Patient Age:    77 years       BP:           113/76 mmHg Patient Gender: M              HR:           80 bpm. Exam Location:  Inpatient  Procedure: 2D Echo, Cardiac Doppler, Color Doppler and Intracardiac            Opacification Agent (Both Spectral and Color Flow Doppler were            utilized during procedure). Indications:    Pulmonary Embolus I26.09  History:        Patient has prior history of Echocardiogram examinations, most                 recent 01/16/2023. CHF, CAD and Previous Myocardial Infarction,                 COPD, Arrythmias:Atrial Fibrillation, Signs/Symptoms:Syncope;                 Risk Factors:Dyslipidemia.  Sonographer:    Lucendia Herrlich RCS Referring Phys: [phone] RAKESH V ALVA  Sonographer Comments: Technically difficult study due to poor echo windows. Image acquisition challenging due to COPD and Image acquisition challenging due to respiratory motion. IMPRESSIONS  1. Left ventricular ejection fraction, by estimation, is 25 to 30%. Left ventricular ejection fraction by PLAX is 25 %.  The left ventricle has severely decreased function. The left ventricle demonstrates global hypokinesis. There is mild left ventricular hypertrophy. Left ventricular diastolic parameters are consistent with Grade I diastolic dysfunction (impaired relaxation). There is the interventricular septum is flattened in systole and diastole, consistent with right ventricular pressure and volume overload.  2. Right ventricular systolic function is mildly reduced. The right ventricular size is normal. There is mildly elevated pulmonary artery systolic pressure. The estimated right ventricular systolic pressure is 37.7 mmHg.  3. The mitral valve is abnormal. Trivial mitral valve regurgitation.  4. Tricuspid valve regurgitation is mild to moderate.  5. The aortic valve is tricuspid. Aortic valve regurgitation is not visualized. Aortic valve sclerosis/calcification is present, without any evidence of aortic stenosis.  6. Aortic dilatation noted. There is borderline dilatation of the aortic root, measuring 38 mm.  7. The inferior vena cava is dilated in size with <50% respiratory variability, suggesting right atrial pressure of 15 mmHg. Comparison(s): Changes from prior study are noted. 01/16/2023: LVEF 40-45%. FINDINGS  Left Ventricle: Left ventricular ejection fraction, by estimation, is 25 to 30%. Left ventricular ejection fraction by PLAX is 25 %. The left ventricle has severely decreased function. The left ventricle demonstrates global hypokinesis. Definity contrast agent was given IV to delineate the left ventricular endocardial borders. The left ventricular internal cavity size was normal in size. There is mild left ventricular hypertrophy. The interventricular septum is flattened in systole and diastole,  consistent with right ventricular pressure and volume overload. Left ventricular diastolic parameters are consistent with Grade I diastolic dysfunction (impaired relaxation). Indeterminate filling pressures. Right  Ventricle: The right ventricular size is normal. No increase in right ventricular wall thickness. Right ventricular systolic function is mildly reduced. There is mildly elevated pulmonary artery systolic pressure. The tricuspid regurgitant velocity  is 2.38 m/s, and with an assumed right atrial pressure of 15 mmHg, the estimated right ventricular systolic pressure is 37.7 mmHg. Left Atrium: Left atrial size was normal in size. Right Atrium: Right atrial size was normal in size. Pericardium: There is no evidence of pericardial effusion. Mitral Valve: The mitral valve is abnormal. Mild mitral annular calcification. Trivial mitral valve regurgitation. Tricuspid Valve: The tricuspid valve is grossly normal. Tricuspid valve regurgitation is mild to moderate. Aortic Valve: The aortic valve is tricuspid. Aortic valve regurgitation is not visualized. Aortic valve sclerosis/calcification is present, without any evidence of  aortic stenosis. Aortic valve peak gradient measures 4.2 mmHg. Pulmonic Valve: The pulmonic valve was grossly normal. Pulmonic valve regurgitation is trivial. Aorta: Aortic dilatation noted. There is borderline dilatation of the aortic root, measuring 38 mm. Venous: The inferior vena cava is dilated in size with less than 50% respiratory variability, suggesting right atrial pressure of 15 mmHg. IAS/Shunts: No atrial level shunt detected by color flow Doppler.  LEFT VENTRICLE PLAX 2D LV EF:         Left            Diastology                ventricular     LV e' medial:   3.57 cm/s                ejection        LV E/e' medial: 11.7                fraction by                PLAX is 25                %. LVIDd:         5.10 cm LVIDs:         4.50 cm LV PW:         1.30 cm LV IVS:        1.20 cm LVOT diam:     2.40 cm LV SV:         41 LV SV Index:   22 LVOT Area:     4.52 cm  RIGHT VENTRICLE            IVC RV S prime:     8.35 cm/s  IVC diam: 2.80 cm TAPSE (M-mode): 1.4 cm LEFT ATRIUM             Index         RIGHT ATRIUM           Index LA diam:        3.90 cm 2.06 cm/m   RA Area:     10.87 cm LA Vol (A2C):   31.9 ml 16.86 ml/m  RA Volume:   23.17 ml  12.25 ml/m LA Vol (A4C):   32.0 ml 16.92 ml/m LA Biplane Vol: 34.8 ml 18.40 ml/m  AORTIC VALVE AV Area (Vmax): 2.94 cm AV Vmax:        103.00 cm/s AV Peak Grad:   4.2 mmHg LVOT Vmax:      66.97 cm/s LVOT Vmean:     39.967 cm/s LVOT VTI:       0.092 m  AORTA Ao Root diam: 3.80 cm Ao Asc diam:  3.10 cm MITRAL VALVE               TRICUSPID VALVE MV Area (PHT): 4.31 cm    TR Peak grad:   22.7 mmHg MV Decel Time: 176 msec    TR Vmax:        238.00 cm/s MV E velocity: 41.70 cm/s MV A velocity: 77.30 cm/s  SHUNTS MV E/A ratio:  0.54        Systemic VTI:  0.09 m                            Systemic Diam: 2.40 cm Zoila Shutter MD Electronically signed by Zoila Shutter MD Signature Date/Time: 05/25/2023/5:16:01 PM  Final    VAS [place] LOWER EXTREMITY VENOUS (DVT) Result Date: 05/25/2023  Lower Venous DVT Study Patient Name:  JASMON GRAFFAM  Date of Exam:   05/25/2023 Medical Rec #: [id number]       Accession #:    [id number] Date of Birth: [birth year]       Patient Gender: M Patient Age:   49 years Exam Location:  [facility] Procedure:      VAS [place] LOWER EXTREMITY VENOUS (DVT) Referring Phys: [facility] ALVA --------------------------------------------------------------------------------  Indications: Swelling, Edema, SOB, pulmonary embolism, and H/O DVT. Other Indications: Current smoker, H/O colon and lung cancer. Comparison Study: Previous study on 3.24.2010. Performing Technologist: Fernande Bras  Examination Guidelines: A complete evaluation includes B-mode imaging, spectral Doppler, color Doppler, and power Doppler as needed of all accessible portions of each vessel. Bilateral testing is considered an integral part of a complete examination. Limited examinations for reoccurring indications may be performed as noted. The reflux portion of the exam is performed with  the patient in reverse Trendelenburg.  +---------+---------------+---------+-----------+---------------+--------------+ RIGHT    CompressibilityPhasicitySpontaneityProperties     Thrombus Aging +---------+---------------+---------+-----------+---------------+--------------+ CFV      Partial        Yes      Yes        brightly       Chronic                                                    echogenic                     +---------+---------------+---------+-----------+---------------+--------------+ SFJ      Full           Yes      Yes                                      +---------+---------------+---------+-----------+---------------+--------------+ FV Prox  Partial        Yes      Yes        brightly       Chronic                                                    echogenic                     +---------+---------------+---------+-----------+---------------+--------------+ FV Mid   Partial        Yes      Yes                       Age                                                                       Indeterminate  +---------+---------------+---------+-----------+---------------+--------------+ FV DistalPartial        Yes  Yes                       Age                                                                       Indeterminate  +---------+---------------+---------+-----------+---------------+--------------+ PFV      Partial        Yes      Yes        brightly       Chronic                                                    echogenic                     +---------+---------------+---------+-----------+---------------+--------------+ POP      Partial        No       Yes                       Age                                                                       Indeterminate  +---------+---------------+---------+-----------+---------------+--------------+ PTV      Partial        No       No                         Age                                                                       Indeterminate  +---------+---------------+---------+-----------+---------------+--------------+ PERO     Partial        No       No                        Age                                                                       Indeterminate  +---------+---------------+---------+-----------+---------------+--------------+ EIV                     Yes      Yes                                      +---------+---------------+---------+-----------+---------------+--------------+  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  +---------+---------------+---------+-----------+----------+-----------------+ LEFT     CompressibilityPhasicitySpontaneityPropertiesThrombus Aging    +---------+---------------+---------+-----------+----------+-----------------+ CFV      Partial        Yes      Yes                  Chronic           +---------+---------------+---------+-----------+----------+-----------------+ SFJ      Full           Yes      Yes                                    +---------+---------------+---------+-----------+----------+-----------------+ FV Prox  Full                                                           +---------+---------------+---------+-----------+----------+-----------------+ FV Mid   Partial        Yes      Yes                  Age Indeterminate +---------+---------------+---------+-----------+----------+-----------------+ FV DistalPartial        Yes      Yes                  Age Indeterminate +---------+---------------+---------+-----------+----------+-----------------+ PFV      Partial        Yes      Yes                  Chronic           +---------+---------------+---------+-----------+----------+-----------------+ POP      Partial        Yes      Yes                  Chronic            +---------+---------------+---------+-----------+----------+-----------------+ PTV      Partial        Yes      Yes                  Age Indeterminate +---------+---------------+---------+-----------+----------+-----------------+ PERO     Partial        No       No                   Age Indeterminate +---------+---------------+---------+-----------+----------+-----------------+ SSV      None           No       No                   Acute             +---------+---------------+---------+-----------+----------+-----------------+ EIV                     Yes      Yes                                    +---------+---------------+---------+-----------+----------+-----------------+ 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    Summary: RIGHT: - Findings consistent with age indeterminate deep vein thrombosis involving the right femoral vein, right  popliteal vein, right posterior tibial veins, and right peroneal veins.  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  - No cystic structure found in the popliteal fossa.  LEFT: - Findings consistent with acute superficial vein thrombosis involving the left small saphenous vein.  - Findings consistent with age indeterminate deep vein thrombosis involving the left femoral vein, left popliteal vein, left posterior tibial veins, and left peroneal veins.  - Findings consistent with chronic deep vein thrombosis involving the left common femoral vein, and left proximal profunda vein.  - No cystic structure found in the popliteal fossa.  *See table(s) above for measurements and observations. Electronically signed by Lemar Livings MD on 05/25/2023 at 2:21:30 PM.    Final    DG CHEST PORT 1 VIEW Result Date: 05/25/2023 CLINICAL DATA:  Pleural effusion. EXAM: PORTABLE CHEST 1 VIEW COMPARISON:  May 24, 2023. FINDINGS: Stable cardiomediastinal silhouette. Stable  moderate size loculated right pleural effusion is noted with adjacent atelectasis or infiltrate. Left lung is unremarkable. Bony thorax is unremarkable. IMPRESSION: Stable moderate size loculated right pleural effusion with adjacent right basilar atelectasis or infiltrate. Electronically Signed   By: Lupita Raider M.D.   On: 05/25/2023 09:16        Scheduled Meds:  budesonide (PULMICORT) nebulizer solution  0.5 mg Nebulization BID   carvedilol  3.125 mg Oral BID WC   doxycycline  100 mg Oral Q12H   furosemide  40 mg Intravenous BID   ipratropium-albuterol  3 mL Nebulization Q6H   losartan  25 mg Oral Daily   predniSONE  40 mg Oral Q breakfast   sildenafil  20 mg Oral TID   sodium chloride HYPERTONIC  4 mL Nebulization BID   Continuous Infusions:  heparin 1,850 Units/hr (05/27/23 0645)          Glade Lloyd, MD [facility] 05/27/2023, 7:23 AM

## 2023-05-27 NOTE — Progress Notes (Signed)
 PHARMACY - ANTICOAGULATION Pharmacy Consult for heparin Indication: pulmonary embolus  Allergies  Allergen Reactions   Adhesive [Tape] Other (See Comments)    After right leg fracture surgery, pt developed a large blister where tape was applied to his right leg. OK to use paper tape.   Eggs-Apples-Oats [Alitraq] Other (See Comments)    Caused chest pains   Imdur [Isosorbide Dinitrate] Other (See Comments)    hallucinations   Singulair [Montelukast Sodium] Other (See Comments)    Hallucinations     Patient Measurements: Height: 6\' 1"  (185.4 cm) Weight: 69.4 kg (153 lb) IBW/kg (Calculated) : 79.9 Heparin Dosing Weight: 72.6 kg   Vital Signs: Temp: 97.4 F (36.3 C) (03/23 0547) Temp Source: Oral (03/23 0547) BP: 108/67 (03/23 0547) Pulse Rate: 80 (03/23 0547)  Labs: Recent Labs    05/24/23 1332 05/24/23 1544 05/24/23 1706 05/25/23 0206 05/25/23 0348 05/25/23 1153 05/26/23 0802 05/26/23 1547 05/27/23 0230  HGB 12.7*  --   --   --  10.6*  --  12.0*  --  10.8*  HCT 39.9  --   --   --  33.0*  --  36.0*  --  33.1*  PLT 373  --   --   --  335  --  354  --  335  APTT  --   --  34 36  --   --   --   --   --   LABPROT  --   --  17.8*  --   --   --   --   --   --   INR  --   --  1.5*  --   --   --   --   --   --   HEPARINUNFRC  --   --   --  <0.10*  --    < > 0.53 0.59 0.50  CREATININE 0.92  --   --   --  0.89  --  0.95  --  1.05  TROPONINIHS 721* 852*  --   --   --   --   --   --   --    < > = values in this interval not displayed.    Estimated Creatinine Clearance: 59.7 mL/min (by C-G formula based on SCr of 1.05 mg/dL).   Assessment: 78 y.o. male with acute on chronic PE in setting of noncompliance with Eliquis for heparin  3/23 AM update: Heparin level therapeutic at 0.50 on 1850 units/hr. Hgb 10.8, PLT 335 -- stable. No issues with heparin infusion and no new bleeding noted per RN. Patient continues to require 30 L/min of HFNC.   Goal of Therapy:  Heparin level  0.3-0.7 units/mL Monitor platelets by anticoagulation protocol: Yes   Plan:  Continue heparin at 1850 units/hr Heparin level and CBC daily while on heparin  Monitor for s/sx of bleeding  Follow-up plans to transition to PO anticoagulation  Lennie Muckle, PharmD PGY1 Pharmacy Resident 05/27/2023 7:37 AM

## 2023-05-27 NOTE — Progress Notes (Signed)
 Daily Progress Note   Patient Name: Bruce [name]       Date: 05/27/2023 DOB: 09-Feb-1946  Age: 78 y.o. MRN#: [id number] Attending Physician: Glade Lloyd, MD Primary Care Physician: Miki Kins, FNP Admit Date: 05/24/2023  Reason for Consultation/Follow-up: Establishing goals of care  Subjective: Medical records reviewed progress notes, labs, imaging. Patient assessed at the bedside.  Reports that his breathing is better today, though he is quite frustrated by the news that his Social Security check is less than usual.  Patient has discussed his options and next steps with his friends.  They are supportive of what ever it is that he wants to do.  He states that he has a better understanding of what is going on with him that he did yesterday.  He is not ready for hospice yet and feels that he will know when it is time.  Outpatient palliative care was explained and offered.  He declines at this time.  He wants to get his affairs in order for the next few weeks and then reconsider hospice afterwards if necessary.  He understands anything can happen anytime.  Explored his thoughts and feelings in his quality of life and what would constitute his consideration of hospice.  He states "I would know it and would just have to be okay with it."  He is looking forward to speaking with his cardiologist again today and discussing his next steps.  He mentioned possibility of leaving AMA and going to the [employer] office.  I encouraged him to consider completing his treatments and stabilizing as much as he can first in order to be healthy enough to get his affairs in order as he desires.  Questions and concerns addressed. PMT will continue to support holistically.   Length of Stay: 3   Physical Exam Vitals and nursing note reviewed.   Constitutional:      General: He is not in acute distress.    Interventions: Nasal cannula in place.  Cardiovascular:     Rate and Rhythm: Normal rate.  Pulmonary:     Effort: Pulmonary effort is normal. No respiratory distress.  Skin:    General: Skin is warm and dry.  Neurological:     Mental Status: He is alert and oriented to person, place, and time.  Psychiatric:        Behavior: Behavior normal.            Vital Signs: BP 113/64 (BP Location: Left Arm)   Pulse 74   Temp (!) 97.5 F (36.4 C) (Oral)   Resp 17   Ht 6\' 1"  (  1.854 m)   Wt 69.4 kg   SpO2 94%   BMI 20.19 kg/m  SpO2: SpO2: 94 % O2 Device: O2 Device: Heated High Flow Nasal Cannula O2 Flow Rate: O2 Flow Rate (L/min): 30 L/min      Palliative Assessment/Data: 50%   Palliative Care Assessment & Plan   Patient Profile: 78 y.o. 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    Patient was referred to ED from outpatient cardiology appointment. The workup in the emergency department led to a CTA and the diagnosis of the right mainstem PE. His troponin was elevated and he has evidence of congestive heart failure with a right pleural effusion and bilateral lower extremity edema well as an elevated BNP. Patient was admitted to the hospitalist service on a heparin drip and IV Lasix for his heart failure.    PMT has been consulted to assist with goals of care conversation.  Assessment: Goals of care conversation Acute on chronic CHF Acute on chronic PE Severe pulmonary hypertension Acute on chronic respiratory failure with hypoxia Right pleural effusion  Recommendations/Plan: Continue DNR/DNI Continue current care  plan. Patient is interested in additional interventions but also concerned about his getting personal affairs in order first. Would consider hospice if he did not improve or if he declined further Ongoing goals of care discussions Outpatient palliative care declined Psychosocial and emotional support provided PMT will continue to follow and support   Prognosis:  Poor, hospice would be appropriate when aligned with goals of care   Discharge Planning: To Be Determined  Care plan was discussed with patient   Total time: I spent 35 minutes in the care of the patient today in the above activities and documenting the encounter.         Julitza Rickles Jeni Salles, PA-C  Palliative Medicine Team Team phone # [phone]  Thank you for allowing the Palliative Medicine Team to assist in the care of this patient. Please utilize secure chat with additional questions, if there is no response within 30 minutes please call the above phone number.  Palliative Medicine Team providers are available by phone from 7am to 7pm daily and can be reached through the team cell phone.  Should this patient require assistance outside of these hours, please call the patient's attending physician.

## 2023-05-27 NOTE — Plan of Care (Signed)

## 2023-05-27 NOTE — Progress Notes (Signed)
 NAME:  KENYETTA FIFE, MRN:  [id number], DOB:  1945/10/29, LOS: 3 ADMISSION DATE:  05/24/2023, CONSULTATION DATE:  05/27/2023  REFERRING MD:  Letitia Caul, CHIEF COMPLAINT: Shortness of breath  History of Present Illness:  78 year old man with severe COPD, chronic respiratory failure on 3 L of oxygen, CTEPH was evaluated in advanced heart failure clinic today and reported worsening dyspnea on exertion and rest and generalized weakness, noted to be hypoxic and sent to the ED for further evaluation. Of note he has a paramedic visit him once a week and layout his pillbox, CHF note states that paramedic mentioned that he has been throwing medications the trash.  Patient denies this but also does not know that he is on a blood thinner medication. ED labs showed troponin 721, BNP 2431, chest x-ray with a right loculated right pleural effusion. CT angiogram chest showed large pulmonary artery embolus involving right main pulmonary artery extending to right middle and right lower branches.  Resolution of left lower lobe pulmonary artery emboli, large right effusion.   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 EF 40 to 45% with moderately reduced RV function and pulm hypertension 04/2022 nonobstructive CAD, normal LVEDP, severe pulmonary hypertension 85/24 Submassive PE 04/2022 requiring IR thrombectomy, subsequent CT imaging and VQ scan consistent with CTEPH , felt not to be a candidate for thrombectomy Prostate cancer Chronic cor pulmonale   Significant Hospital Events: Including procedures, antibiotic start and stop dates in addition to other pertinent events   3/20 admitted increased WOB and weakness 3/21 pulm asked to see for abnormal CT chest. Seen by cards. Good response to diuretics. 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 Seen by [facility]. DNR confirmed.  3/22 feels better still on heated high flow, discussed merits of diagnostic/therapeutic thoracentesis patient did not want to proceed at this  moment  Interim History / Subjective:  Feels better.  Oxygen support been weaned down to 6 L salter  Objective   Blood pressure 105/73, pulse 71, temperature 97.8 F (36.6 C), temperature source Oral, resp. rate 18, height 6\' 1"  (1.854 m), weight 69.4 kg, SpO2 98%.    FiO2 (%):  [60 %-62 %] 62 %   Intake/Output Summary (Last 24 hours) at 05/27/2023 1207 Last data filed at 05/27/2023 1116 Gross per 24 hour  Intake 1186.47 ml  Output 3400 ml  Net -2213.53 ml   Filed Weights   05/25/23 1312 05/26/23 0425 05/27/23 0547  Weight: 67 kg 69.2 kg 69.4 kg    Examination: General Pleasant 79 year old male patient sitting up in chair eating lunch no acute distress today HEENT normocephalic atraumatic mucous membranes are moist neck veins flat Pulmonary severely diminished on the right no accessory use on 3 L/min POC ultrasound: Large right pleural effusion persist estimated at least a liter and volume surprisingly fairly free-flowing, may be some early septations noted towards the more lateral wall of chest  Pcxr still w/ right sided loculated effusion and vol loss Cardiac regular rate and rhythm Extremities is warm dry no edema Neuro intact Resolved Hospital Problem list     Assessment & Plan:  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  -down 4 liters -CXR about same  -Point-of-care ultrasound continues to demonstrate large right pleural effusion appears a little  more free-flowing on ultrasound than chest x-ray would suggest plan Continue budesonide and duonebs He has 1 more day of hypertonic saline nebs, he should continue flutter valve even after nebs  completed continue sildenafil for pulmonary hypertension added doxycycline for COPD exacerbation, plan to complete 7 days total prednisone 40mg  daily day 3/5 Would continue to push diuresis he is over 4 L negative, blood pressure, BUN and creatinine may be limiting factor here.  Reasonable to continue diuresis as long as no significant worsening of his renal function I have discussed thoracentesis with him once again and have recommended this for both diagnostic and therapeutic purpose.  The patient continues to decline but is at least considering it today. If what looks like septation is indeed septations, the longer we wait the drain the higher the chance the lung will not expand, he understands this risk Would continue IV heparin for now We can reevaluate again tomorrow   Acute on chronic biventricular heart failure -poor compliance with meds Paroxysmal atrial fibrillation plan Recs per cards    Best Practice (right click and "Reselect all SmartList Selections" daily)   Diet/type: Regular consistency (see orders) DVT prophylaxis systemic heparin Pressure ulcer(s): N/A GI prophylaxis: N/A Lines: N/A Foley:  N/A Code Status:  full code Last date of multidisciplinary goals of care discussion [NA]

## 2023-05-27 NOTE — Progress Notes (Signed)
 Cardiologist:  Turner/Bensimohn  Subjective:   No chest pain. Shortness of breath is improving. Requiring less oxygen support compared to yesterday Good urine output overnight  Objective:  Vitals:   05/27/23 0817 05/27/23 0818 05/27/23 0832 05/27/23 1203  BP:   113/64 105/73  Pulse:   74 71  Resp:   17 18  Temp:   (!) 97.5 F (36.4 C) 97.8 F (36.6 C)  TempSrc:   Oral Oral  SpO2: 94% 94% 94% 98%  Weight:      Height:        Intake/Output from previous day:  Intake/Output Summary (Last 24 hours) at 05/27/2023 1228 Last data filed at 05/27/2023 1116 Gross per 24 hour  Intake 1186.47 ml  Output 3400 ml  Net -2213.53 ml    Physical Exam:  General: Chronically ill, cachectic, hemodynamically stable, no acute distress HEENT: Arcus senilis, trachea midline, neck supple.  On nasal cannula oxygen Lungs: Decreased breath sounds bilaterally, no wheezes rales or rhonchi's, poor inspiratory effort Heart: Regular, positive S1-S2, systolic murmur, no gallops or rubs Abdomen: Soft, nontender, nondistended, positive bowel sounds in all 4 quadrants Extremities: Right lower extremity notes chronic venous stasis, left lower extremity 1+ pitting edema Neuro: Alert oriented x 4, no acute distress, moves all 4 extremities  Lab Results: Basic Metabolic Panel: Recent Labs    05/26/23 0802 05/27/23 0230  NA 134* 134*  K 3.3* 4.0  CL 99 101  CO2 25 25  GLUCOSE 141* 135*  BUN 22 25*  CREATININE 0.95 1.05  CALCIUM 9.0 9.2  MG 1.7 1.8    CBC: Recent Labs    05/24/23 1332 05/25/23 0348 05/26/23 0802 05/27/23 0230  WBC 15.6*   < > 13.2* 15.0*  NEUTROABS 14.3*  --   --  13.5*  HGB 12.7*   < > 12.0* 10.8*  HCT 39.9   < > 36.0* 33.1*  MCV 98.0   < > 92.8 93.8  PLT 373   < > 354 335   < > = values in this interval not displayed.     Imaging: DG Chest Port 1 View Result Date: 05/27/2023 CLINICAL DATA:  Loculated pleural effusion. EXAM: PORTABLE CHEST 1 VIEW COMPARISON:   05/25/2023 FINDINGS: Loculated right pleural effusion is similar to prior. Right base collapse/consolidation again noted. Left lung remains clear. Cardiopericardial silhouette is at upper limits of normal for size. Fullness in the right hilar region again noted. No acute bony abnormality. Telemetry leads overlie the chest. IMPRESSION: 1. No significant interval change. 2. Loculated right pleural effusion with fullness in the right hilar region and right base collapse/consolidation. Electronically Signed   By: Kennith Center M.D.   On: 05/27/2023 08:56   ECHOCARDIOGRAM COMPLETE Result Date: 05/25/2023    ECHOCARDIOGRAM REPORT   Patient Name:   Bruce [name] Date of Exam: 05/25/2023 Medical Rec #:  [id number]      Height:       73.0 in Accession #:    [id number]     Weight:       147.7 lb Date of Birth:  04-18-1945      BSA:          1.892 m Patient Age:    77 years       BP:           113/76 mmHg Patient Gender: M              HR:  80 bpm. Exam Location:  Inpatient Procedure: 2D Echo, Cardiac Doppler, Color Doppler and Intracardiac            Opacification Agent (Both Spectral and Color Flow Doppler were            utilized during procedure). Indications:    Pulmonary Embolus I26.09  History:        Patient has prior history of Echocardiogram examinations, most                 recent 01/16/2023. CHF, CAD and Previous Myocardial Infarction,                 COPD, Arrythmias:Atrial Fibrillation, Signs/Symptoms:Syncope;                 Risk Factors:Dyslipidemia.  Sonographer:    Lucendia Herrlich RCS Referring Phys: [phone] RAKESH V ALVA  Sonographer Comments: Technically difficult study due to poor echo windows. Image acquisition challenging due to COPD and Image acquisition challenging due to respiratory motion. IMPRESSIONS  1. Left ventricular ejection fraction, by estimation, is 25 to 30%. Left ventricular ejection fraction by PLAX is 25 %. The left ventricle has severely decreased function. The left ventricle  demonstrates global hypokinesis. There is mild left ventricular hypertrophy. Left ventricular diastolic parameters are consistent with Grade I diastolic dysfunction (impaired relaxation). There is the interventricular septum is flattened in systole and diastole, consistent with right ventricular pressure and volume overload.  2. Right ventricular systolic function is mildly reduced. The right ventricular size is normal. There is mildly elevated pulmonary artery systolic pressure. The estimated right ventricular systolic pressure is 37.7 mmHg.  3. The mitral valve is abnormal. Trivial mitral valve regurgitation.  4. Tricuspid valve regurgitation is mild to moderate.  5. The aortic valve is tricuspid. Aortic valve regurgitation is not visualized. Aortic valve sclerosis/calcification is present, without any evidence of aortic stenosis.  6. Aortic dilatation noted. There is borderline dilatation of the aortic root, measuring 38 mm.  7. The inferior vena cava is dilated in size with <50% respiratory variability, suggesting right atrial pressure of 15 mmHg. Comparison(s): Changes from prior study are noted. 01/16/2023: LVEF 40-45%. FINDINGS  Left Ventricle: Left ventricular ejection fraction, by estimation, is 25 to 30%. Left ventricular ejection fraction by PLAX is 25 %. The left ventricle has severely decreased function. The left ventricle demonstrates global hypokinesis. Definity contrast agent was given IV to delineate the left ventricular endocardial borders. The left ventricular internal cavity size was normal in size. There is mild left ventricular hypertrophy. The interventricular septum is flattened in systole and diastole,  consistent with right ventricular pressure and volume overload. Left ventricular diastolic parameters are consistent with Grade I diastolic dysfunction (impaired relaxation). Indeterminate filling pressures. Right Ventricle: The right ventricular size is normal. No increase in right  ventricular wall thickness. Right ventricular systolic function is mildly reduced. There is mildly elevated pulmonary artery systolic pressure. The tricuspid regurgitant velocity  is 2.38 m/s, and with an assumed right atrial pressure of 15 mmHg, the estimated right ventricular systolic pressure is 37.7 mmHg. Left Atrium: Left atrial size was normal in size. Right Atrium: Right atrial size was normal in size. Pericardium: There is no evidence of pericardial effusion. Mitral Valve: The mitral valve is abnormal. Mild mitral annular calcification. Trivial mitral valve regurgitation. Tricuspid Valve: The tricuspid valve is grossly normal. Tricuspid valve regurgitation is mild to moderate. Aortic Valve: The aortic valve is tricuspid. Aortic valve regurgitation is not visualized. Aortic valve sclerosis/calcification  is present, without any evidence of aortic stenosis. Aortic valve peak gradient measures 4.2 mmHg. Pulmonic Valve: The pulmonic valve was grossly normal. Pulmonic valve regurgitation is trivial. Aorta: Aortic dilatation noted. There is borderline dilatation of the aortic root, measuring 38 mm. Venous: The inferior vena cava is dilated in size with less than 50% respiratory variability, suggesting right atrial pressure of 15 mmHg. IAS/Shunts: No atrial level shunt detected by color flow Doppler.  LEFT VENTRICLE PLAX 2D LV EF:         Left            Diastology                ventricular     LV e' medial:   3.57 cm/s                ejection        LV E/e' medial: 11.7                fraction by                PLAX is 25                %. LVIDd:         5.10 cm LVIDs:         4.50 cm LV PW:         1.30 cm LV IVS:        1.20 cm LVOT diam:     2.40 cm LV SV:         41 LV SV Index:   22 LVOT Area:     4.52 cm  RIGHT VENTRICLE            IVC RV S prime:     8.35 cm/s  IVC diam: 2.80 cm TAPSE (M-mode): 1.4 cm LEFT ATRIUM             Index        RIGHT ATRIUM           Index LA diam:        3.90 cm 2.06 cm/m   RA  Area:     10.87 cm LA Vol (A2C):   31.9 ml 16.86 ml/m  RA Volume:   23.17 ml  12.25 ml/m LA Vol (A4C):   32.0 ml 16.92 ml/m LA Biplane Vol: 34.8 ml 18.40 ml/m  AORTIC VALVE AV Area (Vmax): 2.94 cm AV Vmax:        103.00 cm/s AV Peak Grad:   4.2 mmHg LVOT Vmax:      66.97 cm/s LVOT Vmean:     39.967 cm/s LVOT VTI:       0.092 m  AORTA Ao Root diam: 3.80 cm Ao Asc diam:  3.10 cm MITRAL VALVE               TRICUSPID VALVE MV Area (PHT): 4.31 cm    TR Peak grad:   22.7 mmHg MV Decel Time: 176 msec    TR Vmax:        238.00 cm/s MV E velocity: 41.70 cm/s MV A velocity: 77.30 cm/s  SHUNTS MV E/A ratio:  0.54        Systemic VTI:  0.09 m                            Systemic Diam: 2.40 cm Zoila Shutter MD Electronically signed by Zoila Shutter MD  Signature Date/Time: 05/25/2023/5:16:01 PM    Final     Cardiac Studies: ECG: SR rate 93 RVH no acute ST changes   Telemetry: Sinus rhythm with PVCs.  Echo:  November 2024: LVEF 40-45%, global hypokinesis, right ventricular systolic function moderately reduced, size moderately dilated, biatrial enlargement, small pericardial effusion, see report for additional details  March 2025: LVEF 25-30%, right ventricular systolic function mildly reduced, right ventricular size normal, see report  Left and right heart catheterization: February 2024: Minimal CAD, moderate to severe PAH, severe nonischemic cardiomyopathy LVEF <20%  Medications:    budesonide (PULMICORT) nebulizer solution  0.5 mg Nebulization BID   carvedilol  3.125 mg Oral BID WC   doxycycline  100 mg Oral Q12H   furosemide  40 mg Intravenous BID   ipratropium-albuterol  3 mL Nebulization Q6H   losartan  25 mg Oral Daily   predniSONE  40 mg Oral Q breakfast   sildenafil  20 mg Oral TID   sodium chloride HYPERTONIC  4 mL Nebulization BID      heparin 1,850 Units/hr (05/27/23 0645)    Assessment/Plan:  Acute biventricular heart failure Cardiomyopathy, 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 twice daily. Continue Lasix 40 mg IV push twice daily, good urine output over the last 24 hours. Continue losartan 25 mg p.o. daily. Uptitration of GDMT has been difficult due to soft blood pressures. Continue to monitor. Strict I's and O's and daily weights. Supplemental oxygen requirements are improving. Had a heart catheterization February 2024 noted to have minimal CAD. Likely reduction in LVEF seen on recent echocardiogram could be secondary to recent viral illness with influenza A & recurrent PE, etc. Blood pressures are soft in the setting of acute right main pulmonary artery thrombus we will hold off on up titration of GDMT for now.  Reconsider uptitration closer to discharge.  Patient is educated on the importance of medication and outpatient follow-up. After being optimized on GDMT recommend repeat echo in 3 months to see if is a candidate for additional cardiovascular therapies.  But prior to that he has to illustrate compliance  Acute pulmonary embolism: Recurrent. Most recent CT notes large right main pulmonary artery clot burden. Per EMR and not compliant with DOAC. Currently on IV heparin drip. Will need to be transition to oral anticoagulation based on his coverage closer to discharge.  Reemphasized importance of medication compliance.  Bilateral DVT: Right lower extremity age-indeterminate DVT as well as chronic DVT Left lower extremity acute superficial vein thrombosis as well as age-indeterminate DVT Currently on IV heparin Recommend outpatient follow-up with PCP for age-appropriate cancer screening  Severe emphysema: Per EMR patient reported to be smoking; however, patient tells me that he has stopped. Requires 3 to 4 L nasal cannula oxygen at home.  Severe pulmonary hypertension: Had undergone right heart catheterization with Dr. Gala Romney in February 2024. Was on Revatio as outpatient the compliance  questionable Restarted during his hospitalization. Recommend outpatient follow-up with Dr. Gala Romney.  Noncompliance  Overall prognosis is quite guarded. Agree with palliative care consult to help with goals of care discussions. Will continue to diurese the patient and uptitrate GDMT.  Tessa Lerner, DO, [facility]  [facility]  [address] [place], [place] [address]

## 2023-05-27 NOTE — Evaluation (Signed)
 Physical Therapy Evaluation Patient Details Name: Bruce [name] MRN: [id number] DOB: 12-21-1945 Today's Date: 05/27/2023  History of Present Illness  78 yo male presents to [facility] with SOB and chest pain. , CTA chest showed right mainstem PE. Recent admission 2/25 with acute on chronic respiratory failure and volume overload, flu A. PMH: prostate cancer, CHF, COPD, coronary artery disease, GERD, HTN, MI, PVD, HLD, PAD, Hx of DVT & PE, NSTEMI, Afib, pulmonary hypertension, medical noncompliance  Clinical Impression   Pt presents with generalized weakness, impaired balance with history of falls, decreased activity tolerance. Pt to benefit from acute PT to address deficits. Pt ambulated short room distance with use of SPC and light steadying, pt limited in tolerance by dyspnea and LE fatigue. 15LO2 via HFNC, SPO54min 86% post short-distance gait but recovered >90% with seated rest and breathing technique. Pt adamant about d/c home, though he lacks support at home. PT to progress mobility as tolerated, and will continue to follow acutely.          If plan is discharge home, recommend the following: A little help with walking and/or transfers;A little help with bathing/dressing/bathroom   Can travel by private vehicle        Equipment Recommendations None recommended by PT  Recommendations for Other Services       Functional Status Assessment Patient has had a recent decline in their functional status and demonstrates the ability to make significant improvements in function in a reasonable and predictable amount of time.     Precautions / Restrictions Precautions Precautions: Fall Precaution/Restrictions Comments: 15LO2 Restrictions Weight Bearing Restrictions Per Provider Order: No      Mobility  Bed Mobility Overal bed mobility: Needs Assistance Bed Mobility: Supine to Sit     Supine to sit: Supervision     General bed mobility comments: increased time, use of bedrail     Transfers Overall transfer level: Needs assistance Equipment used: Rolling walker (2 wheels) Transfers: Sit to/from Stand Sit to Stand: Contact guard assist           General transfer comment: for safety, slow to rise and steady    Ambulation/Gait Ambulation/Gait assistance: Min assist Gait Distance (Feet): 5 Feet Assistive device: Straight cane Gait Pattern/deviations: Step-through pattern, Decreased stride length, Trunk flexed Gait velocity: decr     General Gait Details: light steadying assist, managing lines/leads. cues for room navigation, pt limited by dyspnea and fatigue  Stairs            Wheelchair Mobility     Tilt Bed    Modified Rankin (Stroke Patients Only)       Balance Overall balance assessment: Needs assistance, History of Falls Sitting-balance support: No upper extremity supported, Feet supported Sitting balance-Leahy Scale: Fair     Standing balance support: Bilateral upper extremity supported Standing balance-Leahy Scale: Poor Standing balance comment: reliant on at least SL support                             Pertinent Vitals/Pain Pain Assessment Pain Assessment: No/denies pain    Home Living Family/patient expects to be discharged to:: Private residence Living Arrangements: Non-relatives/Friends Available Help at Discharge: Friend(s);Available PRN/intermittently Type of Home: Mobile home Home Access: Stairs to enter Entrance Stairs-Rails: None Entrance Stairs-Number of Steps: 7-8 in front, 4-5 in back   Home Layout: One level Home Equipment: Agricultural consultant (2 wheels);Gilmer Mor - single point      Prior Function Prior  Level of Function : Independent/Modified Independent;Driving;History of Falls (last six months)             Mobility Comments: pt endorses using cane for balance, endorses multiple falls       Extremity/Trunk Assessment   Upper Extremity Assessment Upper Extremity Assessment: Defer to OT  evaluation    Lower Extremity Assessment Lower Extremity Assessment: Generalized weakness    Cervical / Trunk Assessment Cervical / Trunk Assessment: Normal  Communication   Communication Communication: Impaired Factors Affecting Communication: Hearing impaired    Cognition Arousal: Alert Behavior During Therapy: WFL for tasks assessed/performed   PT - Cognitive impairments: No apparent impairments                                 Cueing       General Comments General comments (skin integrity, edema, etc.): bruising of various ages on UEs and LEs, pt endorsing falls. 15LO2 via HFNC, SPO33min 86% post short-distance gait but recovered >90% with seated rest and breathing technique    Exercises     Assessment/Plan    PT Assessment Patient needs continued PT services  PT Problem List Decreased strength;Decreased balance;Decreased activity tolerance;Decreased mobility;Decreased cognition;Decreased knowledge of use of DME;Pain;Decreased knowledge of precautions       PT Treatment Interventions DME instruction;Therapeutic activities;Gait training;Therapeutic exercise;Patient/family education;Balance training;Stair training;Functional mobility training;Neuromuscular re-education    PT Goals (Current goals can be found in the Care Plan section)  Acute Rehab PT Goals Patient Stated Goal: home, not going to SNF PT Goal Formulation: With patient Time For Goal Achievement: 06/10/23 Potential to Achieve Goals: Good    Frequency Min 3X/week     Co-evaluation               AM-PAC PT "6 Clicks" Mobility  Outcome Measure Help needed turning from your back to your side while in a flat bed without using bedrails?: A Little Help needed moving from lying on your back to sitting on the side of a flat bed without using bedrails?: A Little Help needed moving to and from a bed to a chair (including a wheelchair)?: A Little Help needed standing up from a chair using  your arms (e.g., wheelchair or bedside chair)?: A Little Help needed to walk in hospital room?: A Little Help needed climbing 3-5 steps with a railing? : A Lot 6 Click Score: 17    End of Session Equipment Utilized During Treatment: Gait belt Activity Tolerance: Patient tolerated treatment well Patient left: in chair;with call bell/phone within reach;with chair alarm set Nurse Communication: Mobility status PT Visit Diagnosis: Other abnormalities of gait and mobility (R26.89);Muscle weakness (generalized) (M62.81)    Time: 7829-5621 PT Time Calculation (min) (ACUTE ONLY): 22 min   Charges:   PT Evaluation $PT Eval Low Complexity: 1 Low   PT General Charges $$ ACUTE PT VISIT: 1 Visit         Marye Round, PT DPT Acute Rehabilitation Services Secure Chat Preferred  Office [phone]   Shaneka Efaw Sheliah Plane 05/27/2023, 3:19 PM

## 2023-05-27 NOTE — Consult Note (Signed)
 Please note that the [facility] nursing team is utilizing a standardized work plan to manage patient consults. We are triaging consults and will try to see the patients within 48 hours. Wound photos in the patient's chart allow [place] to consult on the patient in the most efficient and timely manner.    Guneet Delpino [facility], CNS, [employer] [phone]

## 2023-05-27 NOTE — Plan of Care (Signed)
  Problem: Education: Goal: Knowledge of General Education information will improve Description: Including pain rating scale, medication(s)/side effects and non-pharmacologic comfort measures Outcome: Progressing   Problem: Elimination: Goal: Will not experience complications related to urinary retention Outcome: Progressing   Problem: Pain Managment: Goal: General experience of comfort will improve and/or be controlled Outcome: Progressing   Problem: Clinical Measurements: Goal: Ability to maintain clinical measurements within normal limits will improve Outcome: Not Progressing Goal: Will remain free from infection Outcome: Not Progressing Goal: Respiratory complications will improve Outcome: Not Progressing   Problem: Activity: Goal: Risk for activity intolerance will decrease Outcome: Not Progressing

## 2023-05-28 ENCOUNTER — Other Ambulatory Visit (HOSPITAL_COMMUNITY): Payer: Self-pay

## 2023-05-28 ENCOUNTER — Telehealth (HOSPITAL_COMMUNITY): Payer: Self-pay | Admitting: Pharmacy Technician

## 2023-05-28 DIAGNOSIS — J90 Pleural effusion, not elsewhere classified: Secondary | ICD-10-CM | POA: Diagnosis not present

## 2023-05-28 DIAGNOSIS — I428 Other cardiomyopathies: Secondary | ICD-10-CM | POA: Diagnosis not present

## 2023-05-28 DIAGNOSIS — I5082 Biventricular heart failure: Secondary | ICD-10-CM | POA: Diagnosis not present

## 2023-05-28 DIAGNOSIS — I272 Pulmonary hypertension, unspecified: Secondary | ICD-10-CM | POA: Diagnosis not present

## 2023-05-28 DIAGNOSIS — I5043 Acute on chronic combined systolic (congestive) and diastolic (congestive) heart failure: Secondary | ICD-10-CM | POA: Diagnosis not present

## 2023-05-28 DIAGNOSIS — I2699 Other pulmonary embolism without acute cor pulmonale: Secondary | ICD-10-CM | POA: Diagnosis not present

## 2023-05-28 LAB — CBC WITH DIFFERENTIAL/PLATELET
Abs Immature Granulocytes: 0.08 10*3/uL — ABNORMAL HIGH (ref 0.00–0.07)
Basophils Absolute: 0 10*3/uL (ref 0.0–0.1)
Basophils Relative: 0 %
Eosinophils Absolute: 0 10*3/uL (ref 0.0–0.5)
Eosinophils Relative: 0 %
HCT: 33.5 % — ABNORMAL LOW (ref 39.0–52.0)
Hemoglobin: 11 g/dL — ABNORMAL LOW (ref 13.0–17.0)
Immature Granulocytes: 1 %
Lymphocytes Relative: 6 %
Lymphs Abs: 0.9 10*3/uL (ref 0.7–4.0)
MCH: 31.1 pg (ref 26.0–34.0)
MCHC: 32.8 g/dL (ref 30.0–36.0)
MCV: 94.6 fL (ref 80.0–100.0)
Monocytes Absolute: 0.6 10*3/uL (ref 0.1–1.0)
Monocytes Relative: 4 %
Neutro Abs: 12.6 10*3/uL — ABNORMAL HIGH (ref 1.7–7.7)
Neutrophils Relative %: 89 %
Platelets: 341 10*3/uL (ref 150–400)
RBC: 3.54 MIL/uL — ABNORMAL LOW (ref 4.22–5.81)
RDW: 14.6 % (ref 11.5–15.5)
WBC: 14.1 10*3/uL — ABNORMAL HIGH (ref 4.0–10.5)
nRBC: 0 % (ref 0.0–0.2)

## 2023-05-28 LAB — BASIC METABOLIC PANEL
Anion gap: 9 (ref 5–15)
BUN: 21 mg/dL (ref 8–23)
CO2: 25 mmol/L (ref 22–32)
Calcium: 9.3 mg/dL (ref 8.9–10.3)
Chloride: 101 mmol/L (ref 98–111)
Creatinine, Ser: 1.08 mg/dL (ref 0.61–1.24)
GFR, Estimated: >60 mL/min (ref >60)
Glucose, Bld: 110 mg/dL — ABNORMAL HIGH (ref 70–99)
Potassium: 3.7 mmol/L (ref 3.5–5.1)
Sodium: 135 mmol/L (ref 135–145)

## 2023-05-28 LAB — HEPARIN LEVEL (UNFRACTIONATED): Heparin Unfractionated: 0.65 [IU]/mL (ref 0.30–0.70)

## 2023-05-28 LAB — MAGNESIUM: Magnesium: 1.8 mg/dL (ref 1.7–2.4)

## 2023-05-28 MED ORDER — IPRATROPIUM-ALBUTEROL 0.5-2.5 (3) MG/3ML IN SOLN
3.0000 mL | Freq: Two times a day (BID) | RESPIRATORY_TRACT | Status: DC
  Administered 2023-05-28 – 2023-06-03 (×9): 3 mL via RESPIRATORY_TRACT
  Filled 2023-05-28 (×12): qty 3

## 2023-05-28 MED ORDER — ENSURE ENLIVE PO LIQD
237.0000 mL | Freq: Two times a day (BID) | ORAL | Status: DC
  Administered 2023-05-28 – 2023-06-06 (×18): 237 mL via ORAL

## 2023-05-28 MED ORDER — TRAMADOL HCL 50 MG PO TABS
50.0000 mg | ORAL_TABLET | Freq: Four times a day (QID) | ORAL | Status: DC | PRN
  Administered 2023-05-28 – 2023-06-06 (×13): 50 mg via ORAL
  Filled 2023-05-28 (×14): qty 1

## 2023-05-28 NOTE — Consult Note (Signed)
 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 Apply VASHE moist gauze to wound bed.  Cover with silicone foam.  Change daily.  Will not follow at this time.  Please re-consult if needed.  Mike Gip MSN, RN, FNP-BC CWON Wound, Ostomy, Continence Nurse [facility] [phone] Pager [phone]

## 2023-05-28 NOTE — TOC Initial Note (Addendum)
 Transition of Care [facility]) - Initial/Assessment Note    Patient Details  Name: Bruce [name] MRN: [id number] Date of Birth: [birth year]  Transition of [facility]) CM/SW Contact:    Leone Haven, RN Phone Number: 05/28/2023, 5:09 PM  Clinical Narrative:                 Patient gave this NCM permission to speak in front of his room mate, [address]. From home with room mate, has PCP and insurance on file, states has no HH services in place at this time , has home oxygen 4 liters, cane, w/chair, walker, shower chair  at home.  States room mate, Shelva Majestic  will transport them home at [employer] and he is support system, states gets medications from [employer].  Pta self ambulatory. NCM offered choice for HHPT, he refused, states he has had this before and it did no good, he can do this on his own he said. Co pay for eliquis is zero co pay.  Expected Discharge Plan: Home w Home Health Services Barriers to Discharge: Continued Medical Work up   Patient Goals and CMS Choice Patient states their goals for this hospitalization and ongoing recovery are:: return home CMS Medicare.gov Compare Post Acute Care list provided to:: Patient Choice offered to / list presented to : Patient      Expected Discharge Plan and Services In-house Referral: NA Discharge Planning Services: CM Consult Post Acute Care Choice: NA Living arrangements for the past 2 months: Mobile Home                 DME Arranged: N/A DME Agency: NA       HH Arranged: PT, Patient Refused HH          Prior Living Arrangements/Services Living arrangements for the past 2 months: Mobile Home Lives with:: Roommate Patient language and need for interpreter reviewed:: Yes Do you feel safe going back to the place where you live?: Yes      Need for Family Participation in Patient Care: Yes (Comment) Care giver support system in place?: Yes (comment) Current home services: DME (home oxygen 4 liters, cane, w/chair, walker,  shower chair) Criminal Activity/Legal Involvement Pertinent to Current Situation/Hospitalization: No - Comment as needed  Activities of Daily Living   ADL Screening (condition at time of admission) Independently performs ADLs?: Yes (appropriate for developmental age) Is the patient deaf or have difficulty hearing?: Yes Does the patient have difficulty seeing, even when wearing glasses/contacts?: No Does the patient have difficulty concentrating, remembering, or making decisions?: Yes  Permission Sought/Granted Permission sought to share information with : Case Manager Permission granted to share information with : Yes, Verbal Permission Granted              Emotional Assessment Appearance:: Appears stated age Attitude/Demeanor/Rapport: Engaged Affect (typically observed): Appropriate Orientation: : Oriented to Self, Oriented to Place, Oriented to  Time, Oriented to Situation Alcohol / Substance Use: Not Applicable Psych Involvement: No (comment)  Admission diagnosis:  Pleural effusion, right [J90] Pulmonary embolism on right [facility]) [I26.99] Patient Active Problem List   Diagnosis Date Noted   NICM (nonischemic cardiomyopathy) (HCC) 05/26/2023   Pulmonary arterial hypertension (HCC) 05/26/2023   Noncompliance 05/26/2023   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   Pleural effusion, right 05/24/2023   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   Pulmonary embolism on right (HCC) 05/24/2023   Neuropathy 05/19/2023   Acute on chronic respiratory failure with hypoxia (HCC) 04/19/2023  Acute decompensated heart failure (HCC) 04/18/2023   Prolonged Q-T interval on ECG 04/18/2023   Influenza A 04/18/2023   Cor pulmonale (chronic) (HCC) 04/18/2023   Intermittent claudication (HCC) 12/18/2022   B12 deficiency 12/18/2022   MDD (major depressive disorder), recurrent episode, moderate (HCC) 09/01/2022   Pulmonary hypertension, unspecified (HCC)  04/14/2022   Acute on chronic combined systolic and diastolic CHF (congestive heart failure) (HCC) 04/13/2022   Pulmonary embolism (HCC) 04/09/2022   PAF (paroxysmal atrial fibrillation) (HCC)    NSTEMI (non-ST elevated myocardial infarction) (HCC) 10/10/2018   Syncope 04/28/2018   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   Limb ischemia 11/18/2013   Acute blood loss anemia 10/08/2013   Anxiety disorder 10/08/2013   Dyslipidemia 10/08/2013   GERD (gastroesophageal reflux disease) 10/08/2013   Anticoagulated 10/08/2013   Hypertension    CAD (coronary artery disease) 08/16/2010   COPD (chronic obstructive pulmonary disease) (HCC) 08/16/2010   DVT of leg (deep venous thrombosis) (HCC) 08/16/2010   PCP:  Miki Kins, FNP Pharmacy:   [facility] [place], [place] - [address] [place] [address] Phone: [phone] Fax: [phone]     Social Drivers of Health (SDOH) Social History: SDOH Screenings   Food Insecurity: No Food Insecurity (05/25/2023)  Recent Concern: Food Insecurity - Food Insecurity Present (05/24/2023)  Housing: Low Risk  (05/25/2023)  Recent Concern: Housing - High Risk (04/19/2023)  Transportation Needs: Unmet Transportation Needs (05/25/2023)  Utilities: Not At Risk (05/25/2023)  Financial Resource Strain: High Risk (04/12/2022)  Physical Activity: Inactive (02/05/2017)  Social Connections: Unknown (05/25/2023)  Stress: Stress Concern Present (02/05/2017)  Tobacco Use: Medium Risk (05/24/2023)   SDOH Interventions:     Readmission Risk Interventions    05/28/2023    5:06 PM  Readmission Risk Prevention Plan  Transportation Screening Complete  PCP or Specialist Appt within 5-7 Days Complete  Home Care Screening Complete  Medication Review (RN CM) Complete

## 2023-05-28 NOTE — Progress Notes (Signed)
 PROGRESS NOTE    Bruce [name]  ZOX:[id number] DOB: 1945/05/15 DOA: 05/24/2023 PCP: Miki Kins, FNP   Brief Narrative:  78 year old male with history of COPD, chronic respiratory failure with hypoxia on 3 L oxygen via nasal cannula chronically, bilateral PE in February 2024 status post thrombolysis, nonobstructive coronary artery disease (as per cardiac catheterization in February 2024), paroxysmal A-fib, pulmonary hypertension, chronic systolic heart failure, medication noncompliance and continued tobacco abuse presented with worsening shortness of breath and chest discomfort.  On presentation, CTA chest showed right mainstem PE.  He was found to have elevated high-sensitivity troponin of 721 and 852 and elevated BNP of 2431.6 he was started on heparin drip and IV Lasix.  Pulmonary, palliative care and cardiology were consulted.  Assessment & Plan:   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sm, right main pulmonary arterial thrombus Noncompliance with anticoagulation Bilateral lower extremity chronic DVTs -Continue heparin drip.  Pulmonary following.  Not a candidate for thrombectomy.  No evidence of right heart strain on CT. -lower extremity duplex showed bilateral lower extremity chronic DVTs. -2D echo showed EF of 25 to 30% with grade 1 diastolic dysfunction  Severe pulmonary hypertension -Noncompliant with Eliquis and sildenafil.  Sildenafil has been resumed inpatient by cardiology.  Acute on chronic respiratory failure with hypoxia Severe COPD Ongoing tobacco abuse -Uses 3 L oxygen via nasal cannula at home.   -Respiratory status.  Currently on 8 L/min high flow nasal cannula oxygen. -Counseled regarding tobacco cessation by admitting hospitalist -Continue current nebs along with prednisone.  Pulmonary following.  Right pleural effusion, lobulated -Continue diuretics for now.  Pulmonary following: Patient declined thoracentesis on 05/26/2023 but is unable to thoracentesis possibly  today.  Acute on chronic combined CHF Positive troponins: Possibly from demand ischemia from above -Patient is noncompliant with his medications.  Presented with volume overload and elevated BNP. -Cardiology following.  2D echo as above.  Continue IV Lasix.  Continue Coreg and losartan.  Strict input and output.  Daily weights.  Fluid restriction.  Diuresing well.  Negative balance of 5467.6 cc since admission.  Hyponatremia -Improved  Hypokalemia -Resolved  Paroxysmal A-fib -Currently rate controlled.  Not taking amiodarone, digoxin or Eliquis at home.  Continue heparin drip for now  Leukocytosis -Monitor  Anemia of chronic disease -From chronic illnesses.  Monitor intermittently.  No signs of bleeding  Goals of care Physical deconditioning -Overall prognosis is guarded to poor because of patient's noncompliance and continued tobacco abuse.  Palliative care following.  DVT prophylaxis: Heparin drip Code Status: DNR Family Communication: None at bedside Disposition Plan: Status is: Inpatient Remains inpatient appropriate because: Of severity of illness    Consultants: Pulmonary/cardiology/palliative care  Procedures: None  Antimicrobials: None   Subjective: Patient seen and examined at bedside.  Feels slightly better but still short of breath with minimal exertion.  Denies any fevers, chest pain or vomiting.   Objective: Vitals:   05/27/23 2000 05/27/23 2022 05/28/23 0022 05/28/23 0530  BP:   114/74 115/75  Pulse: 67  67 73  Resp: 18  (!) 23 (!) 22  Temp:   (!) 97.5 F (36.4 C) 98.1 F (36.7 C)  TempSrc:   Oral Oral  SpO2: 98% 99% 95% 97%  Weight:    64.7 kg  Height:        Intake/Output Summary (Last 24 hours) at 05/28/2023 0738 Last data filed at 05/28/2023 0022 Gross per 24 hour  Intake 1034.18 ml  Output 4350 ml  Net -3315.82 ml  Filed Weights   05/26/23 0425 05/27/23 0547 05/28/23 0530  Weight: 69.2 kg 69.4 kg 64.7 kg     Examination:  General: On 8 L high flow nasal cannula oxygen.  No distress.  Chronically ill and deconditioned looking. ENT/neck: No obvious thyromegaly or elevated JVD noted respiratory: Decreased breath sounds at bases bilaterally with basilar crackles  CVS: S1-S2 heard; currently rate controlled abdominal: Soft, nontender, distended slightly; no organomegaly, normal bowel sounds heard Extremities: Trace lower extremity edema; no cyanosis  CNS: Sleepy, wakes up slightly, still very slow to respond.  No obvious focal neurologic deficit noted.   Lymph: No cervical lymphadenopathy Skin: No obvious ecchymosis/lesions psych: Mostly flat affect.  Showing no signs of agitation currently.   Musculoskeletal: No obvious joint erythema/tenderness    Data Reviewed: I have personally reviewed following labs and imaging studies  CBC: Recent Labs  Lab 05/24/23 1332 05/25/23 0348 05/26/23 0802 05/27/23 0230 05/28/23 0304  WBC 15.6* 12.0* 13.2* 15.0* 14.1*  NEUTROABS 14.3*  --   --  13.5* 12.6*  HGB 12.7* 10.6* 12.0* 10.8* 11.0*  HCT 39.9 33.0* 36.0* 33.1* 33.5*  MCV 98.0 94.6 92.8 93.8 94.6  PLT 373 335 354 335 341   Basic Metabolic Panel: Recent Labs  Lab 05/24/23 1332 05/25/23 0348 05/26/23 0802 05/27/23 0230 05/28/23 0304  NA 137 135 134* 134* 135  K 4.0 3.5 3.3* 4.0 3.7  CL 105 103 99 101 101  CO2 21* 22 25 25 25   GLUCOSE 152* 102* 141* 135* 110*  BUN 21 18 22  25* 21  CREATININE 0.92 0.89 0.95 1.05 1.08  CALCIUM 9.1 8.4* 9.0 9.2 9.3  MG  --   --  1.7 1.8 1.8   GFR: Estimated Creatinine Clearance: 54.1 mL/min (by C-G formula based on SCr of 1.08 mg/dL). Liver Function Tests: No results for input(s): "AST", "ALT", "ALKPHOS", "BILITOT", "PROT", "ALBUMIN" in the last 168 hours. No results for input(s): "LIPASE", "AMYLASE" in the last 168 hours. No results for input(s): "AMMONIA" in the last 168 hours. Coagulation Profile: Recent Labs  Lab 05/24/23 1706  INR 1.5*    Cardiac Enzymes: No results for input(s): "CKTOTAL", "CKMB", "CKMBINDEX", "TROPONINI" in the last 168 hours. BNP (last 3 results) No results for input(s): "PROBNP" in the last 8760 hours. HbA1C: No results for input(s): "HGBA1C" in the last 72 hours. CBG: No results for input(s): "GLUCAP" in the last 168 hours. Lipid Profile: No results for input(s): "CHOL", "HDL", "LDLCALC", "TRIG", "CHOLHDL", "LDLDIRECT" in the last 72 hours. Thyroid Function Tests: No results for input(s): "TSH", "T4TOTAL", "FREET4", "T3FREE", "THYROIDAB" in the last 72 hours. Anemia Panel: No results for input(s): "VITAMINB12", "FOLATE", "FERRITIN", "TIBC", "IRON", "RETICCTPCT" in the last 72 hours. Sepsis Labs: Recent Labs  Lab 05/24/23 1833  LATICACIDVEN 1.7    No results found for this or any previous visit (from the past 240 hours).       Radiology Studies: DG Chest Port 1 View Result Date: 05/27/2023 CLINICAL DATA:  Loculated pleural effusion. EXAM: PORTABLE CHEST 1 VIEW COMPARISON:  05/25/2023 FINDINGS: Loculated right pleural effusion is similar to prior. Right base collapse/consolidation again noted. Left lung remains clear. Cardiopericardial silhouette is at upper limits of normal for size. Fullness in the right hilar region again noted. No acute bony abnormality. Telemetry leads overlie the chest. IMPRESSION: 1. No significant interval change. 2. Loculated right pleural effusion with fullness in the right hilar region and right base collapse/consolidation. Electronically Signed   By: Jamison Oka.D.  On: 05/27/2023 08:56        Scheduled Meds:  budesonide (PULMICORT) nebulizer solution  0.5 mg Nebulization BID   carvedilol  3.125 mg Oral BID WC   doxycycline  100 mg Oral Q12H   furosemide  40 mg Intravenous BID   ipratropium-albuterol  3 mL Nebulization Q6H   losartan  25 mg Oral Daily   predniSONE  40 mg Oral Q breakfast   sildenafil  20 mg Oral TID   sodium chloride HYPERTONIC  4  mL Nebulization BID   Continuous Infusions:  heparin 1,850 Units/hr (05/27/23 2027)          Glade Lloyd, MD [facility] 05/28/2023, 7:38 AM

## 2023-05-28 NOTE — Progress Notes (Signed)
 NAME:  NUMAIR MASDEN, MRN:  [id number], DOB:  08/25/1945, LOS: 4 ADMISSION DATE:  05/24/2023, CONSULTATION DATE:  05/28/2023  REFERRING MD:  Letitia Caul, CHIEF COMPLAINT: Shortness of breath  History of Present Illness:  78 year old man with severe COPD, chronic respiratory failure on 3 L of oxygen, CTEPH was evaluated in advanced heart failure clinic today and reported worsening dyspnea on exertion and rest and generalized weakness, noted to be hypoxic and sent to the ED for further evaluation. Of note he has a paramedic visit him once a week and layout his pillbox, CHF note states that paramedic mentioned that he has been throwing medications the trash.  Patient denies this but also does not know that he is on a blood thinner medication. ED labs showed troponin 721, BNP 2431, chest x-ray with a right loculated right pleural effusion. CT angiogram chest showed large pulmonary artery embolus involving right main pulmonary artery extending to right middle and right lower branches.  Resolution of left lower lobe pulmonary artery emboli, large right effusion.   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 EF 40 to 45% with moderately reduced RV function and pulm hypertension 04/2022 nonobstructive CAD, normal LVEDP, severe pulmonary hypertension 85/24 Submassive PE 04/2022 requiring IR thrombectomy, subsequent CT imaging and VQ scan consistent with CTEPH , felt not to be a candidate for thrombectomy Prostate cancer Chronic cor pulmonale   Significant Hospital Events: Including procedures, antibiotic start and stop dates in addition to other pertinent events   3/20 admitted increased WOB and weakness 3/21 pulm asked to see for abnormal CT chest. Seen by cards. Good response to diuretics. 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 Seen by [facility]. DNR confirmed.  3/22 feels better still on heated high flow, discussed merits of diagnostic/therapeutic thoracentesis patient did not want to proceed at this  moment  Interim History / Subjective:  Feels better.  Oxygen support been weaned down to 4L  Objective   Blood pressure 112/75, pulse 68, temperature (!) 97.5 F (36.4 C), temperature source Axillary, resp. rate 18, height 6\' 1"  (1.854 m), weight 64.7 kg, SpO2 97%.        Intake/Output Summary (Last 24 hours) at 05/28/2023 1729 Last data filed at 05/28/2023 1541 Gross per 24 hour  Intake 661.8 ml  Output 4390 ml  Net -3728.2 ml   Filed Weights   05/26/23 0425 05/27/23 0547 05/28/23 0530  Weight: 69.2 kg 69.4 kg 64.7 kg    Examination: General Pleasant 78 year old male patient sitting up in chair eating lunch no acute distress today HEENT normocephalic atraumatic mucous membranes are moist neck veins  are to 4cm  Pulmonary severely diminished on the right no accessory use on 3 L/min  Ancillary tests  Small to moderate effusion on right.   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 Continue to diurese.  - Will defer thoracentesis indefinitely unless dyspnea persists despite adequate diuresis.  - Please reconsult at that time.    Best Practice (right click and "Reselect all SmartList Selections" daily)   Diet/type: Regular consistency (see orders) DVT prophylaxis systemic heparin Pressure ulcer(s): N/A GI prophylaxis: N/A Lines: N/A Foley:  N/A Code Status:  full code Last date of multidisciplinary goals of care  discussion [NA]  Lynnell Catalan, MD [facility] ICU Physician [facility]  Pager: [phone] Or Epic Secure Chat After hours: [phone].  05/28/2023, 5:31 PM

## 2023-05-28 NOTE — Telephone Encounter (Signed)

## 2023-05-28 NOTE — Progress Notes (Addendum)
 Cardiologist:  Turner/Bensimohn  Subjective:  BP 111/73.  On HFNC 8L.  Excellent diuresis yesterday, net negative 3.3L, negative 5.5L on admission.  Weight is down 17lbs from admission.  Cr stable at 1.08.  Reports dyspnea improved  Objective:  Vitals:   05/28/23 0022 05/28/23 0530 05/28/23 0810 05/28/23 0827  BP: 114/74 115/75  111/73  Pulse: 67 73  77  Resp: (!) 23 (!) 22  20  Temp: (!) 97.5 F (36.4 C) 98.1 F (36.7 C)  (!) 96.8 F (36 C)  TempSrc: Oral Oral  Axillary  SpO2: 95% 97% 100% 97%  Weight:  64.7 kg    Height:        Intake/Output from previous day:  Intake/Output Summary (Last 24 hours) at 05/28/2023 1001 Last data filed at 05/28/2023 1610 Gross per 24 hour  Intake 975.74 ml  Output 3150 ml  Net -2174.26 ml    Physical Exam:  General: Chronically ill, cachectic, hemodynamically stable, no acute distress HEENT: Arcus senilis, trachea midline, neck supple.  On nasal cannula oxygen Lungs: Decreased breath sounds bilaterally, no wheezes rales or rhonchi Heart: Regular, positive S1-S2, systolic murmur, no gallops or rubs Abdomen: Soft, nontender, nondistended, Extremities: Right lower extremity notes chronic venous stasis, no edema Neuro: Alert oriented x 4, no acute distress, moves all 4 extremities  Lab Results: Basic Metabolic Panel: Recent Labs    05/27/23 0230 05/28/23 0304  NA 134* 135  K 4.0 3.7  CL 101 101  CO2 25 25  GLUCOSE 135* 110*  BUN 25* 21  CREATININE 1.05 1.08  CALCIUM 9.2 9.3  MG 1.8 1.8    CBC: Recent Labs    05/27/23 0230 05/28/23 0304  WBC 15.0* 14.1*  NEUTROABS 13.5* 12.6*  HGB 10.8* 11.0*  HCT 33.1* 33.5*  MCV 93.8 94.6  PLT 335 341     Imaging: DG Chest Port 1 View Result Date: 05/27/2023 CLINICAL DATA:  Loculated pleural effusion. EXAM: PORTABLE CHEST 1 VIEW COMPARISON:  05/25/2023 FINDINGS: Loculated right pleural effusion is similar to prior. Right base collapse/consolidation again noted. Left lung remains  clear. Cardiopericardial silhouette is at upper limits of normal for size. Fullness in the right hilar region again noted. No acute bony abnormality. Telemetry leads overlie the chest. IMPRESSION: 1. No significant interval change. 2. Loculated right pleural effusion with fullness in the right hilar region and right base collapse/consolidation. Electronically Signed   By: Kennith Center M.D.   On: 05/27/2023 08:56    Cardiac Studies: ECG: SR rate 93 RVH no acute ST changes   Telemetry: Sinus rhythm with PVCs.  Echo:  November 2024: LVEF 40-45%, global hypokinesis, right ventricular systolic function moderately reduced, size moderately dilated, biatrial enlargement, small pericardial effusion, see report for additional details  March 2025: LVEF 25-30%, right ventricular systolic function mildly reduced, right ventricular size normal, see report  Left and right heart catheterization: February 2024: Minimal CAD, moderate to severe PAH, severe nonischemic cardiomyopathy LVEF <20%  Medications:    budesonide (PULMICORT) nebulizer solution  0.5 mg Nebulization BID   carvedilol  3.125 mg Oral BID WC   doxycycline  100 mg Oral Q12H   furosemide  40 mg Intravenous BID   ipratropium-albuterol  3 mL Nebulization BID   losartan  25 mg Oral Daily   predniSONE  40 mg Oral Q breakfast   sildenafil  20 mg Oral TID   sodium chloride HYPERTONIC  4 mL Nebulization BID      heparin 1,850 Units/hr (05/27/23  2027)    Assessment/Plan:  78 year old male with severe COPD, ongoing tobacco use, CAD, chronic systolic heart failure, colon cancer, lung cancer, prostate cancer, PE/DVT who we are consulted for acute on chronic HFrEF  Acute on chronic combined systolic and diastolic heart failure: Admitted 04/2022 with submassive PE requiring thrombectomy.  LVEF at that time found to be 20 to 25% and RV severely reduced on echo.  Cath with minimal CAD, moderate to severe pulmonary hypertension echo 01/24/2023  showed EF 40 to 45%.  Care has been complicated by noncompliance.  Admitted from advanced heart failure clinic for volume overload on 05/24/23.  Echo this admission shows EF 25 to 30%, mild RV dysfunction -Continue IV Lasix for today.  Volume status improved, may be able to transition to PO diuretics tomorrow -May need thoracentesis for right pleural effusion, PCCM following -Continue losartan 25 mg daily, carvedilol 3.125 mg twice daily.  Prior to admission was just on Farxiga and digoxin, off spironolactone due to hyperkalemia.  Acute PE/DVT: Recurrent, has been noncompliant with DOAC.  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  RHC with moderate to severe pulmonary hypertension.  Was on Revatio as outpatient but questionable compliance.  Restarted this admission  Paroxysmal atrial fibrillation: On amiodarone 100 mg daily, not ideal given COPD.  Continue anticoagulation  Little Ishikawa, MD

## 2023-05-28 NOTE — Progress Notes (Signed)
 Heart Failure Navigator Progress Note  Assessed for Heart & Vascular TOC clinic readiness.  Patient does not meet criteria due to Advanced Heart Failure Team patient of Dr. Gala Romney. .   Navigator will sign off at this time.   Rhae Hammock, BSN, Scientist, clinical (histocompatibility and immunogenetics) Only

## 2023-05-28 NOTE — Evaluation (Signed)
 Occupational Therapy Evaluation Patient Details Name: Bruce [name] MRN: [id number] DOB: 10-24-1945 Today's Date: 05/28/2023   History of Present Illness   78 yo male presents to [facility] with SOB and chest pain. , CTA chest showed right mainstem PE. Recent admission 2/25 with acute on chronic respiratory failure and volume overload, flu A. PMH: prostate cancer, CHF, COPD, coronary artery disease, GERD, HTN, MI, PVD, HLD, PAD, Hx of DVT & PE, NSTEMI, Afib, pulmonary hypertension, medical noncompliance     Clinical Impressions PTA, pt lives with a friend, typically ambulatory with a cane and able to manage ADLs, IADLs w/o issues. Pt reports wearing 3-4 L O2 at baseline. Pt presents now with deficits in cardiopulmonary endurance. Evaluation limited as pt hyperverbose about issues at hospital and at home; difficult to redirect. Overall, pt requires no more than CGA for ADL mgmt, SPO2 88-89% on 7 L O2 with bed level/EOB tasks. Pt declined OOB tasks due to fatigue. Anticipate pt will benefit from [facility] follow up at DC.     If plan is discharge home, recommend the following:   Assistance with cooking/housework     Functional Status Assessment   Patient has had a recent decline in their functional status and demonstrates the ability to make significant improvements in function in a reasonable and predictable amount of time.     Equipment Recommendations   None recommended by OT     Recommendations for Other Services         Precautions/Restrictions   Precautions Precautions: Fall Recall of Precautions/Restrictions: Intact Precaution/Restrictions Comments: monitor O2 (wears 3-4 L O2 at baseline) Restrictions Weight Bearing Restrictions Per Provider Order: No     Mobility Bed Mobility Overal bed mobility: Modified Independent Bed Mobility: Supine to Sit                Transfers                   General transfer comment: declined OOB transfer due to "just  woke up". despite OT talking with pt earlier this AM about moving around      Balance Overall balance assessment: Needs assistance, History of Falls Sitting-balance support: No upper extremity supported, Feet supported Sitting balance-Leahy Scale: Fair                                     ADL either performed or assessed with clinical judgement   ADL Overall ADL's : Needs assistance/impaired Eating/Feeding: Independent   Grooming: Set up;Sitting   Upper Body Bathing: Set up;Sitting   Lower Body Bathing: Contact guard assist;Sitting/lateral leans;Sit to/from stand   Upper Body Dressing : Set up;Sitting   Lower Body Dressing: Contact guard assist;Sitting/lateral leans;Sit to/from stand       Toileting- Architect and Hygiene: Contact guard assist;Sitting/lateral lean;Sit to/from stand         General ADL Comments: Pt self limiting session, had multiple complaints regarding hospital stay, leads, etc difficult to redirect. provided energy conservation handout. Pt reports walking to bathroom himself without assist overnight though nursing staff advised against as pt had removed all lines/leads including O2     Vision Ability to See in Adequate Light: 0 Adequate Patient Visual Report: No change from baseline Vision Assessment?: No apparent visual deficits     Perception         Praxis         Pertinent Vitals/Pain Pain  Assessment Pain Assessment: No/denies pain     Extremity/Trunk Assessment Upper Extremity Assessment Upper Extremity Assessment: Overall WFL for tasks assessed   Lower Extremity Assessment Lower Extremity Assessment: Defer to PT evaluation   Cervical / Trunk Assessment Cervical / Trunk Assessment: Normal   Communication Communication Communication: No apparent difficulties   Cognition Arousal: Alert Behavior During Therapy: WFL for tasks assessed/performed Cognition: No apparent impairments             OT -  Cognition Comments: at baseline but hyperverbose about complaints regarding hospital stay and personal life- difficult to redirect to tasks. pt prefers to direct session                 Following commands: Intact       Cueing  General Comments   Cueing Techniques: Verbal cues  SpO2 88-89% on 7 L O2. changed O2 probe and good pleth noted   Exercises     Shoulder Instructions      Home Living Family/patient expects to be discharged to:: Private residence Living Arrangements: Non-relatives/Friends Available Help at Discharge: Friend(s);Available PRN/intermittently Type of Home: Mobile home Home Access: Stairs to enter Entrance Stairs-Number of Steps: 7-8 in front, 4-5 in back Entrance Stairs-Rails: None Home Layout: One level     Bathroom Shower/Tub: Chief Strategy Officer: Standard Bathroom Accessibility: No   Home Equipment: Agricultural consultant (2 wheels);Cane - single point          Prior Functioning/Environment Prior Level of Function : Independent/Modified Independent;Driving;History of Falls (last six months)             Mobility Comments: pt endorses using cane for balance, endorses multiple falls ADLs Comments: Pt says he was independent in all ADLs, has a shower chair. has a pittbull dog that he cares for    OT Problem List: Decreased strength;Decreased activity tolerance;Impaired balance (sitting and/or standing);Cardiopulmonary status limiting activity   OT Treatment/Interventions: Self-care/ADL training;Therapeutic exercise;Energy conservation;DME and/or AE instruction;Therapeutic activities;Patient/family education      OT Goals(Current goals can be found in the care plan section)   Acute Rehab OT Goals Patient Stated Goal: get some sleep. get this thoracentesis done OT Goal Formulation: With patient Time For Goal Achievement: 06/11/23 Potential to Achieve Goals: Good ADL Goals Pt Will Perform Grooming: with modified  independence;standing Pt Will Perform Lower Body Bathing: with contact guard assist;sit to/from stand Pt Will Transfer to Toilet: with modified independence;ambulating Pt/caregiver will Perform Home Exercise Program: Increased strength;Both right and left upper extremity;With theraband;Independently;With written HEP provided Additional ADL Goal #1: Pt to verbalize at least 3 energy conservation strategies to implement at home   OT Frequency:  Min 1X/week    Co-evaluation              AM-PAC OT "6 Clicks" Daily Activity     Outcome Measure Help from another person eating meals?: None Help from another person taking care of personal grooming?: A Little Help from another person toileting, which includes using toliet, bedpan, or urinal?: A Little Help from another person bathing (including washing, rinsing, drying)?: A Little Help from another person to put on and taking off regular upper body clothing?: A Little Help from another person to put on and taking off regular lower body clothing?: A Little 6 Click Score: 19   End of Session Nurse Communication: Mobility status  Activity Tolerance: Patient tolerated treatment well Patient left:    OT Visit Diagnosis: Muscle weakness (generalized) (M62.81);Other abnormalities of gait and  mobility (R26.89)                Time: 1610-9604 OT Time Calculation (min): 35 min Charges:  OT General Charges $OT Visit: 1 Visit OT Evaluation $OT Eval Moderate Complexity: 1 Mod OT Treatments $Therapeutic Activity: 8-22 mins  Bradd Canary, OTR/L Acute Rehab Services Office: [phone]   [name] 05/28/2023, 10:10 AM

## 2023-05-28 NOTE — Progress Notes (Signed)
 Daily Progress Note   Patient Name: Bruce [name]       Date: 05/28/2023 DOB: 07-Aug-1945  Age: 78 y.o. MRN#: [id number] Attending Physician: Glade Lloyd, MD Primary Care Physician: Miki Kins, FNP Admit Date: 05/24/2023  Reason for Consultation/Follow-up: Establishing goals of care  Subjective: Medical records reviewed progress notes, labs, imaging. Patient assessed at the bedside. He reports continued improvement. No visitors present.  Patient feels good about his progress during his acute illness and still worries about his living situation. He is concerned that he may not be able to live on his own much longer, but wants to take things one day at a time. His goal is to find a new living environment and stop doing so much for his roommate in the meantime.   We reviewed the role of palliative care and hospice during his disease trajectory and the importance of ongoing GOC discussions based on his quality of life, disease burden, and values. He verbalized his understanding and appreciation.  Questions and concerns addressed. PMT will continue to support holistically.   Length of Stay: 4   Physical Exam Vitals and nursing note reviewed.  Constitutional:      General: He is not in acute distress.    Interventions: Nasal cannula in place.  Cardiovascular:     Rate and Rhythm: Normal rate.  Pulmonary:     Effort: Pulmonary effort is normal. No respiratory distress.  Skin:    General: Skin is warm and dry.  Neurological:     Mental Status: He is alert and oriented to person, place, and time.  Psychiatric:        Behavior: Behavior normal.            Vital Signs: BP 115/75 (BP Location: Left Arm)   Pulse 73   Temp 98.1 F (36.7 C) (Oral)   Resp (!) 22   Ht 6\' 1"  (1.854 m)   Wt 64.7 kg   SpO2 100%   BMI 18.82 kg/m   SpO2: SpO2: 100 % O2 Device: O2 Device: High Flow Nasal Cannula O2 Flow Rate: O2 Flow Rate (L/min): 8 L/min      Palliative Assessment/Data: 60%   Palliative Care Assessment & Plan   Patient Profile: 78 y.o. 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    Patient was referred  to ED from outpatient cardiology appointment. The workup in the emergency department led to a CTA and the diagnosis of the right mainstem PE. His troponin was elevated and he has evidence of congestive heart failure with a right pleural effusion and bilateral lower extremity edema well as an elevated BNP. Patient was admitted to the hospitalist service on a heparin drip and IV Lasix for his heart failure.    PMT has been consulted to assist with goals of care conversation.  Assessment: Goals of care conversation Acute on chronic CHF Acute on chronic PE Severe pulmonary hypertension Acute on chronic respiratory failure with hypoxia Right pleural effusion  Recommendations/Plan: Continue DNR/DNI Continue current care plan Goal is to improve from his illness and then begin looking for a new place to stay Psychosocial and emotional support provided PMT remains available as needed   Prognosis:  Poor, hospice would be appropriate when aligned with goals of care   Discharge Planning: To Be Determined  Care plan was discussed with patient   Total time: I spent 35 minutes in the care of the patient today in the above activities and documenting the encounter.         Karlea Mckibbin Jeni Salles, PA-C  Palliative Medicine Team Team phone # [phone]  Thank you for allowing the Palliative Medicine Team to assist in  the care of this patient. Please utilize secure chat with additional questions, if there is no response within 30 minutes please call the above phone number.  Palliative Medicine Team providers are available by phone from 7am to 7pm daily and can be reached through the team cell phone.  Should this patient require assistance outside of these hours, please call the patient's attending physician.

## 2023-05-28 NOTE — Progress Notes (Signed)
 PHARMACY - ANTICOAGULATION Pharmacy Consult for heparin Indication: pulmonary embolus  Allergies  Allergen Reactions   Adhesive [Tape] Other (See Comments)    After right leg fracture surgery, pt developed a large blister where tape was applied to his right leg. OK to use paper tape.   Eggs-Apples-Oats [Alitraq] Other (See Comments)    Caused chest pains   Imdur [Isosorbide Dinitrate] Other (See Comments)    hallucinations   Singulair [Montelukast Sodium] Other (See Comments)    Hallucinations     Patient Measurements: Height: 6\' 1"  (185.4 cm) Weight: 64.7 kg (142 lb 10.2 oz) IBW/kg (Calculated) : 79.9 Heparin Dosing Weight: 72.6 kg   Vital Signs: Temp: 96.8 F (36 C) (03/24 0827) Temp Source: Axillary (03/24 0827) BP: 111/73 (03/24 0827) Pulse Rate: 77 (03/24 0827)  Labs: Recent Labs    05/26/23 0802 05/26/23 1547 05/27/23 0230 05/28/23 0304  HGB 12.0*  --  10.8* 11.0*  HCT 36.0*  --  33.1* 33.5*  PLT 354  --  335 341  HEPARINUNFRC 0.53 0.59 0.50 0.65  CREATININE 0.95  --  1.05 1.08    Estimated Creatinine Clearance: 54.1 mL/min (by C-G formula based on SCr of 1.08 mg/dL).   Assessment: 78 y.o. male with acute on chronic PE in setting of noncompliance with Eliquis for heparin  -heparin level= 0.65 on heparin 1850 units/hr, CBC stable -plans for possible thoracentesis today  Goal of Therapy:  Heparin level 0.3-0.7 units/mL Monitor platelets by anticoagulation protocol: Yes   Plan:  Continue heparin at 1850 units/hr Will follow procedural plans Heparin level and CBC daily while on heparin   Harland German, PharmD Clinical Pharmacist **Pharmacist phone directory can now be found on [email or web address] ([email or web address]).  Listed under [facility].

## 2023-05-29 DIAGNOSIS — I2699 Other pulmonary embolism without acute cor pulmonale: Secondary | ICD-10-CM | POA: Diagnosis not present

## 2023-05-29 DIAGNOSIS — I272 Pulmonary hypertension, unspecified: Secondary | ICD-10-CM | POA: Diagnosis not present

## 2023-05-29 DIAGNOSIS — J90 Pleural effusion, not elsewhere classified: Secondary | ICD-10-CM | POA: Diagnosis not present

## 2023-05-29 DIAGNOSIS — I5043 Acute on chronic combined systolic (congestive) and diastolic (congestive) heart failure: Secondary | ICD-10-CM | POA: Diagnosis not present

## 2023-05-29 LAB — CBC WITH DIFFERENTIAL/PLATELET
Abs Immature Granulocytes: 0.1 10*3/uL — ABNORMAL HIGH (ref 0.00–0.07)
Basophils Absolute: 0 10*3/uL (ref 0.0–0.1)
Basophils Relative: 0 %
Eosinophils Absolute: 0 10*3/uL (ref 0.0–0.5)
Eosinophils Relative: 0 %
HCT: 36.7 % — ABNORMAL LOW (ref 39.0–52.0)
Hemoglobin: 11.8 g/dL — ABNORMAL LOW (ref 13.0–17.0)
Immature Granulocytes: 1 %
Lymphocytes Relative: 5 %
Lymphs Abs: 0.8 10*3/uL (ref 0.7–4.0)
MCH: 30.3 pg (ref 26.0–34.0)
MCHC: 32.2 g/dL (ref 30.0–36.0)
MCV: 94.1 fL (ref 80.0–100.0)
Monocytes Absolute: 0.8 10*3/uL (ref 0.1–1.0)
Monocytes Relative: 5 %
Neutro Abs: 14.5 10*3/uL — ABNORMAL HIGH (ref 1.7–7.7)
Neutrophils Relative %: 89 %
Platelets: 377 10*3/uL (ref 150–400)
RBC: 3.9 MIL/uL — ABNORMAL LOW (ref 4.22–5.81)
RDW: 14.6 % (ref 11.5–15.5)
WBC: 16.3 10*3/uL — ABNORMAL HIGH (ref 4.0–10.5)
nRBC: 0 % (ref 0.0–0.2)

## 2023-05-29 LAB — BASIC METABOLIC PANEL
Anion gap: 9 (ref 5–15)
BUN: 27 mg/dL — ABNORMAL HIGH (ref 8–23)
CO2: 29 mmol/L (ref 22–32)
Calcium: 9.7 mg/dL (ref 8.9–10.3)
Chloride: 97 mmol/L — ABNORMAL LOW (ref 98–111)
Creatinine, Ser: 1.04 mg/dL (ref 0.61–1.24)
GFR, Estimated: >60 mL/min (ref >60)
Glucose, Bld: 115 mg/dL — ABNORMAL HIGH (ref 70–99)
Potassium: 4 mmol/L (ref 3.5–5.1)
Sodium: 135 mmol/L (ref 135–145)

## 2023-05-29 LAB — HEPARIN LEVEL (UNFRACTIONATED): Heparin Unfractionated: 0.57 [IU]/mL (ref 0.30–0.70)

## 2023-05-29 LAB — MAGNESIUM: Magnesium: 1.9 mg/dL (ref 1.7–2.4)

## 2023-05-29 MED ORDER — APIXABAN 5 MG PO TABS
5.0000 mg | ORAL_TABLET | Freq: Two times a day (BID) | ORAL | Status: DC
  Administered 2023-06-05 – 2023-06-06 (×3): 5 mg via ORAL
  Filled 2023-05-29 (×3): qty 1

## 2023-05-29 MED ORDER — APIXABAN 5 MG PO TABS
10.0000 mg | ORAL_TABLET | Freq: Two times a day (BID) | ORAL | Status: AC
  Administered 2023-05-29 – 2023-06-04 (×14): 10 mg via ORAL
  Filled 2023-05-29 (×14): qty 2

## 2023-05-29 MED ORDER — DAPAGLIFLOZIN PROPANEDIOL 10 MG PO TABS
10.0000 mg | ORAL_TABLET | Freq: Every day | ORAL | Status: DC
  Administered 2023-05-29 – 2023-06-06 (×9): 10 mg via ORAL
  Filled 2023-05-29 (×9): qty 1

## 2023-05-29 MED ORDER — FUROSEMIDE 40 MG PO TABS
40.0000 mg | ORAL_TABLET | Freq: Every day | ORAL | Status: DC
  Administered 2023-05-30 – 2023-06-03 (×5): 40 mg via ORAL
  Filled 2023-05-29 (×5): qty 1

## 2023-05-29 NOTE — Progress Notes (Signed)
 CSW reviewed SDOH needs and added transportation resources to patients AVS.   Johnnette Gourd, MSW, LCSWA Transitions of Care [phone]

## 2023-05-29 NOTE — Discharge Instructions (Addendum)
 TRANSPORTATION: -[facility]decai [facility]es Department of Health: Call [facility] and [employer] at [phone] for details. [email or web address]  -[employer] [place]: [employer] [place] is the [employer]'s shared-ride transportation service for eligible riders who have a disability that prevents them from riding the fixed route bus. Call [phone]. [employer] [place] riders must pay a fare of $1.50 per trip, or may purchase a 10-ride punch card for $14.00 ($1.40 per ride) or a 40-ride punch card for $48.00 ($1.20 per ride).  -[employer] rideshare transportation service is provided for senior citizens (60+) who live independently within [place] city limits and are unable to drive or have limited access to transportation. Call [phone] to schedule an appointment.  -[place] [facility]: For Medicare or Medicaid recipients call [phone]?[facility], wheelchair Zenaida Niece, and ambulatory quotes available.   ---------------------------------------------------------------------------------------  Information on my medicine - ELIQUIS (apixaban)  Why was Eliquis prescribed for you? Eliquis was prescribed to treat blood clots that may have been found in the veins of your legs (deep vein thrombosis) or in your lungs (pulmonary embolism) and to reduce the risk of them occurring again.  What do You need to know about Eliquis ? The starting dose is 10 mg (two 5 mg tablets) taken TWICE daily for the FIRST SEVEN (7) DAYS, then the dose is reduced to ONE 5 mg tablet taken TWICE daily.  Eliquis may be taken with or without food.   Try to take the dose about the same time in the morning and in the evening. If you have difficulty swallowing the tablet whole please discuss with your pharmacist how to take the medication safely.  Take Eliquis exactly as prescribed and DO NOT stop taking Eliquis without  talking to the doctor who prescribed the medication.  Stopping may increase your risk of developing a new blood clot.  Refill your prescription before you run out.  After discharge, you should have regular check-up appointments with your healthcare provider that is prescribing your Eliquis.    What do you do if you miss a dose? If a dose of ELIQUIS is not taken at the scheduled time, take it as soon as possible on the same day and twice-daily administration should be resumed. The dose should not be doubled to make up for a missed dose.  Important Safety Information A possible side effect of Eliquis is bleeding. You should call your healthcare provider right away if you experience any of the following: Bleeding from an injury or your nose that does not stop. Unusual colored urine (red or dark brown) or unusual colored stools (red or black). Unusual bruising for unknown reasons. A serious fall or if you hit your head (even if there is no bleeding).  Some medicines may interact with Eliquis and might increase your risk of bleeding or clotting while on Eliquis. To help avoid this, consult your healthcare provider or pharmacist prior to using any new prescription or non-prescription medications, including herbals, vitamins, non-steroidal anti-inflammatory drugs (NSAIDs) and supplements.  This website has more information on Eliquis (apixaban): http://www.eliquis.com/eliquis/home

## 2023-05-29 NOTE — Plan of Care (Signed)
  Problem: Health Behavior/Discharge Planning: Goal: Ability to manage health-related needs will improve Outcome: Progressing   Problem: Activity: Goal: Risk for activity intolerance will decrease Outcome: Progressing   Problem: Safety: Goal: Ability to remain free from injury will improve Outcome: Progressing   

## 2023-05-29 NOTE — Progress Notes (Signed)
 PHARMACY - ANTICOAGULATION Pharmacy Consult for heparin Indication: pulmonary embolus  Allergies  Allergen Reactions   Adhesive [Tape] Other (See Comments)    After right leg fracture surgery, pt developed a large blister where tape was applied to his right leg. OK to use paper tape.   Eggs-Apples-Oats [Alitraq] Other (See Comments)    Caused chest pains   Imdur [Isosorbide Dinitrate] Other (See Comments)    hallucinations   Singulair [Montelukast Sodium] Other (See Comments)    Hallucinations     Patient Measurements: Height: 6\' 1"  (185.4 cm) Weight: 65.2 kg (143 lb 12.8 oz) IBW/kg (Calculated) : 79.9 Heparin Dosing Weight: 72.6 kg   Vital Signs: Temp: 98.2 F (36.8 C) (03/25 0725) Temp Source: Axillary (03/25 0725) BP: 121/78 (03/25 0725) Pulse Rate: 66 (03/25 0725)  Labs: Recent Labs    05/27/23 0230 05/28/23 0304 05/29/23 0230  HGB 10.8* 11.0* 11.8*  HCT 33.1* 33.5* 36.7*  PLT 335 341 377  HEPARINUNFRC 0.50 0.65 0.57  CREATININE 1.05 1.08 1.04    Estimated Creatinine Clearance: 56.6 mL/min (by C-G formula based on SCr of 1.04 mg/dL).   Assessment: 78 y.o. 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  Listed under [facility].

## 2023-05-29 NOTE — Progress Notes (Signed)
 PROGRESS NOTE    Bruce [name]  XBM:[id number] DOB: Nov 15, 1945 DOA: 05/24/2023 PCP: Miki Kins, FNP   Brief Narrative:  78 year old male with history of COPD, chronic respiratory failure with hypoxia on 3 L oxygen via nasal cannula chronically, bilateral PE in February 2024 status post thrombolysis, nonobstructive coronary artery disease (as per cardiac catheterization in February 2024), paroxysmal A-fib, pulmonary hypertension, chronic systolic heart failure, medication noncompliance and continued tobacco abuse presented with worsening shortness of breath and chest discomfort.  On presentation, CTA chest showed right mainstem PE.  He was found to have elevated high-sensitivity troponin of 721 and 852 and elevated BNP of 2431.6 he was started on heparin drip and IV Lasix.  Pulmonary, palliative care and cardiology were consulted.  Assessment & Plan:   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sm, right main pulmonary arterial thrombus Noncompliance with anticoagulation Bilateral lower extremity chronic DVTs -Currently on heparin drip.  Pulmonary not recommended no need for thoracentesis.  Not a candidate for thrombectomy.  No evidence of right heart strain on CT. will switch to Eliquis. -lower extremity duplex showed bilateral lower extremity chronic DVTs. -2D echo showed EF of 25 to 30% with grade 1 diastolic dysfunction  Severe pulmonary hypertension -Noncompliant with Eliquis and sildenafil.  Sildenafil has been resumed inpatient by cardiology.  Acute on chronic respiratory failure with hypoxia Severe COPD Ongoing tobacco abuse -Uses 3 L oxygen via nasal cannula at home.   -Respiratory status has much improved.  Currently on 3 L oxygen via nasal cannula. -Counseled regarding tobacco cessation by admitting hospitalist -Continue current nebs along with prednisone.  Pulmonary signed off on 05/28/2023.  Right pleural effusion, lobulated -Continue diuretics for now.  Pulmonary signed off on  05/28/2023 and has currently deferred thoracentesis indefinitely unless dyspnea persist despite adequate diuresis.  Acute on chronic combined CHF Positive troponins: Possibly from demand ischemia from above -Patient is noncompliant with his medications.  Presented with volume overload and elevated BNP. -Cardiology following.  2D echo as above.  Continue IV Lasix.  Continue Coreg and losartan.  Strict input and output.  Daily weights.  Fluid restriction.  Diuresing well.  Negative balance of 8835.6 cc since admission.  Hyponatremia -Improved  Hypokalemia -Resolved  Paroxysmal A-fib -Currently rate controlled.  Not taking amiodarone, digoxin or Eliquis at home.  On heparin drip currently.  Eliquis plan as above  Leukocytosis -Monitor  Anemia of chronic disease -From chronic illnesses.  Monitor intermittently.  No signs of bleeding  Goals of care Physical deconditioning -Overall prognosis is guarded to poor because of patient's noncompliance and continued tobacco abuse.  Palliative care following.  DVT prophylaxis: Heparin drip Code Status: DNR Family Communication: None at bedside Disposition Plan: Status is: Inpatient Remains inpatient appropriate because: Of severity of illness    Consultants: Pulmonary/cardiology/palliative care  Procedures: None  Antimicrobials: None   Subjective: Patient seen and examined at bedside.  No fever, vomiting, chest pain reported.  Still slightly short of breath with mild exertion but improving.   Objective: Vitals:   05/29/23 0414 05/29/23 0616 05/29/23 0700 05/29/23 0714  BP:      Pulse: 67     Resp: 16     Temp:      TempSrc:      SpO2: 99%  100% 96%  Weight:  65.2 kg    Height:        Intake/Output Summary (Last 24 hours) at 05/29/2023 0745 Last data filed at 05/29/2023 0700 Gross per 24 hour  Intake 297  ml  Output 3665 ml  Net -3368 ml   Filed Weights   05/27/23 0547 05/28/23 0530 05/29/23 0616  Weight: 69.4 kg 64.7 kg  65.2 kg    Examination:  General: No acute distress.  Currently on 3 L oxygen via nasal cannula..  Chronically ill and deconditioned looking. ENT/neck: No obvious JVD elevation or palpable neck masses noted  respiratory: Bilateral decreased breath sounds at bases with some crackles CVS: Mostly rate controlled; S1 and S2 are heard  abdominal: Soft, nontender, mildly distended; no organomegaly, bowel sounds heard  extremities: No cyanosis; mild lower extremity edema present CNS: Awake, remains slow to respond.  No focal deficits noted lymph: No obvious lymphadenopathy palpable  skin: No obvious rashes/petechiae psych: Extremely flat affect.  Not agitated currently. Musculoskeletal: No obvious joint swelling/deformity    Data Reviewed: I have personally reviewed following labs and imaging studies  CBC: Recent Labs  Lab 05/24/23 1332 05/25/23 0348 05/26/23 0802 05/27/23 0230 05/28/23 0304 05/29/23 0230  WBC 15.6* 12.0* 13.2* 15.0* 14.1* 16.3*  NEUTROABS 14.3*  --   --  13.5* 12.6* 14.5*  HGB 12.7* 10.6* 12.0* 10.8* 11.0* 11.8*  HCT 39.9 33.0* 36.0* 33.1* 33.5* 36.7*  MCV 98.0 94.6 92.8 93.8 94.6 94.1  PLT 373 335 354 335 341 377   Basic Metabolic Panel: Recent Labs  Lab 05/25/23 0348 05/26/23 0802 05/27/23 0230 05/28/23 0304 05/29/23 0230  NA 135 134* 134* 135 135  K 3.5 3.3* 4.0 3.7 4.0  CL 103 99 101 101 97*  CO2 22 25 25 25 29   GLUCOSE 102* 141* 135* 110* 115*  BUN 18 22 25* 21 27*  CREATININE 0.89 0.95 1.05 1.08 1.04  CALCIUM 8.4* 9.0 9.2 9.3 9.7  MG  --  1.7 1.8 1.8 1.9   GFR: Estimated Creatinine Clearance: 56.6 mL/min (by C-G formula based on SCr of 1.04 mg/dL). Liver Function Tests: No results for input(s): "AST", "ALT", "ALKPHOS", "BILITOT", "PROT", "ALBUMIN" in the last 168 hours. No results for input(s): "LIPASE", "AMYLASE" in the last 168 hours. No results for input(s): "AMMONIA" in the last 168 hours. Coagulation Profile: Recent Labs  Lab  05/24/23 1706  INR 1.5*   Cardiac Enzymes: No results for input(s): "CKTOTAL", "CKMB", "CKMBINDEX", "TROPONINI" in the last 168 hours. BNP (last 3 results) No results for input(s): "PROBNP" in the last 8760 hours. HbA1C: No results for input(s): "HGBA1C" in the last 72 hours. CBG: No results for input(s): "GLUCAP" in the last 168 hours. Lipid Profile: No results for input(s): "CHOL", "HDL", "LDLCALC", "TRIG", "CHOLHDL", "LDLDIRECT" in the last 72 hours. Thyroid Function Tests: No results for input(s): "TSH", "T4TOTAL", "FREET4", "T3FREE", "THYROIDAB" in the last 72 hours. Anemia Panel: No results for input(s): "VITAMINB12", "FOLATE", "FERRITIN", "TIBC", "IRON", "RETICCTPCT" in the last 72 hours. Sepsis Labs: Recent Labs  Lab 05/24/23 1833  LATICACIDVEN 1.7    No results found for this or any previous visit (from the past 240 hours).       Radiology Studies: No results found.       Scheduled Meds:  budesonide (PULMICORT) nebulizer solution  0.5 mg Nebulization BID   carvedilol  3.125 mg Oral BID WC   doxycycline  100 mg Oral Q12H   feeding supplement  237 mL Oral BID BM   furosemide  40 mg Intravenous BID   ipratropium-albuterol  3 mL Nebulization BID   losartan  25 mg Oral Daily   predniSONE  40 mg Oral Q breakfast   sildenafil  20  mg Oral TID   Continuous Infusions:  heparin 1,850 Units/hr (05/29/23 0315)          Glade Lloyd, MD [facility] 05/29/2023, 7:45 AM

## 2023-05-29 NOTE — Progress Notes (Signed)
 Cardiologist:  Turner/Bensimohn  Subjective:  BP 121/78.  On [place] 3L.  Excellent diuresis yesterday, net negative 3.4L, negative 8.8L on admission.  Cr stable at 1.04.  Reports dyspnea improved  Objective:  Vitals:   05/29/23 0616 05/29/23 0700 05/29/23 0714 05/29/23 0725  BP:    121/78  Pulse:    66  Resp:    19  Temp:    98.2 F (36.8 C)  TempSrc:    Axillary  SpO2:  100% 96% 97%  Weight: 65.2 kg     Height:        Intake/Output from previous day:  Intake/Output Summary (Last 24 hours) at 05/29/2023 0953 Last data filed at 05/29/2023 0948 Gross per 24 hour  Intake 60 ml  Output 4355 ml  Net -4295 ml    Physical Exam:  General: Chronically ill, cachectic, hemodynamically stable, no acute distress HEENT: neck supple.  On nasal cannula oxygen Lungs: Decreased breath sounds bilaterally, no wheezes rales or rhonchi Heart: Regular, positive S1-S2, systolic murmur, no gallops or rubs Abdomen: Soft, nontender, nondistended, Extremities: Right lower extremity notes chronic venous stasis, no edema Neuro: Alert oriented x 4, no acute distress, moves all 4 extremities  Lab Results: Basic Metabolic Panel: Recent Labs    05/28/23 0304 05/29/23 0230  NA 135 135  K 3.7 4.0  CL 101 97*  CO2 25 29  GLUCOSE 110* 115*  BUN 21 27*  CREATININE 1.08 1.04  CALCIUM 9.3 9.7  MG 1.8 1.9    CBC: Recent Labs    05/28/23 0304 05/29/23 0230  WBC 14.1* 16.3*  NEUTROABS 12.6* 14.5*  HGB 11.0* 11.8*  HCT 33.5* 36.7*  MCV 94.6 94.1  PLT 341 377     Imaging: No results found.   Cardiac Studies: ECG: SR rate 93 RVH no acute ST changes   Telemetry: Sinus rhythm with PVCs.  Echo:  November 2024: LVEF 40-45%, global hypokinesis, right ventricular systolic function moderately reduced, size moderately dilated, biatrial enlargement, small pericardial effusion, see report for additional details  March 2025: LVEF 25-30%, right ventricular systolic function mildly reduced,  right ventricular size normal, see report  Left and right heart catheterization: February 2024: Minimal CAD, moderate to severe PAH, severe nonischemic cardiomyopathy LVEF <20%  Medications:    apixaban  10 mg Oral BID   Followed by   Melene Muller ON 06/05/2023] apixaban  5 mg Oral BID   budesonide (PULMICORT) nebulizer solution  0.5 mg Nebulization BID   carvedilol  3.125 mg Oral BID WC   doxycycline  100 mg Oral Q12H   feeding supplement  237 mL Oral BID BM   furosemide  40 mg Intravenous BID   ipratropium-albuterol  3 mL Nebulization BID   losartan  25 mg Oral Daily   predniSONE  40 mg Oral Q breakfast   sildenafil  20 mg Oral TID        Assessment/Plan:  78 year old male with severe COPD, ongoing tobacco use, CAD, chronic systolic heart failure, colon cancer, lung cancer, prostate cancer, PE/DVT who we are consulted for acute on chronic HFrEF  Acute on chronic combined systolic and diastolic heart failure: Admitted 04/2022 with submassive PE requiring thrombectomy.  LVEF at that time found to be 20 to 25% and RV severely reduced on echo.  Cath with minimal CAD, moderate to severe pulmonary hypertension echo 01/24/2023 showed EF 40 to 45%.  Care has been complicated by noncompliance.  Admitted from advanced heart failure clinic for volume overload on 05/24/23.  Echo this admission shows EF 25 to 30%, mild RV dysfunction -Appears euvolemic.  Received IV lasix today, will transition to PO lasix tomorrow -May need thoracentesis for right pleural effusion, PCCM following -Continue losartan 25 mg daily, carvedilol 3.125 mg twice daily.  Add farxiga 10 mg daily.  Off spironolactone due to hyperkalemia.  Acute PE/DVT: Recurrent, has been noncompliant with DOAC.  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  RHC with moderate to severe pulmonary hypertension.  Was on Revatio as outpatient but questionable compliance.  Restarted this admission  Paroxysmal atrial  fibrillation: On amiodarone 100 mg daily, not ideal given COPD.  Continue anticoagulation   Little Ishikawa, MD

## 2023-05-29 NOTE — Progress Notes (Signed)
 PT Cancellation Note  Patient Details Name: Bruce [name] MRN: [id number] DOB: 1945/12/12   Cancelled Treatment:    Reason Eval/Treat Not Completed: Other (comment)  Patient dressed and states he is being discharged. No discharge order noted, however pt adamant that he is leaving. Notified MD and RN.    Jerolyn Center, PT Acute Rehabilitation Services  Office [phone]   Zena Amos 05/29/2023, 1:48 PM

## 2023-05-29 NOTE — Care Management Important Message (Signed)
 Important Message  Patient Details  Name: Bruce [name] MRN: [id number] Date of Birth: 1946-01-23   Important Message Given:  Yes - Medicare IM     Renie Ora 05/29/2023, 11:54 AM

## 2023-05-29 NOTE — Progress Notes (Signed)
 PT Cancellation Note  Patient Details Name: Bruce [name] MRN: [id number] DOB: 04/15/1945   Cancelled Treatment:    Reason Eval/Treat Not Completed: Fatigue/lethargy limiting ability to participate  Patient refused due to "too tired" to do anything else today. Explained benefits of activity, especially related to being well enough to discharge home. Patient continued to refuse.    Jerolyn Center, PT Acute Rehabilitation Services  Office [phone]  Zena Amos 05/29/2023, 3:35 PM

## 2023-05-30 ENCOUNTER — Inpatient Hospital Stay (HOSPITAL_COMMUNITY)

## 2023-05-30 ENCOUNTER — Encounter (HOSPITAL_COMMUNITY): Payer: 59

## 2023-05-30 DIAGNOSIS — I5043 Acute on chronic combined systolic (congestive) and diastolic (congestive) heart failure: Secondary | ICD-10-CM | POA: Diagnosis not present

## 2023-05-30 DIAGNOSIS — I272 Pulmonary hypertension, unspecified: Secondary | ICD-10-CM

## 2023-05-30 DIAGNOSIS — I2699 Other pulmonary embolism without acute cor pulmonale: Secondary | ICD-10-CM | POA: Diagnosis not present

## 2023-05-30 DIAGNOSIS — J90 Pleural effusion, not elsewhere classified: Secondary | ICD-10-CM | POA: Diagnosis not present

## 2023-05-30 LAB — CBC WITH DIFFERENTIAL/PLATELET
Abs Immature Granulocytes: 0.09 10*3/uL — ABNORMAL HIGH (ref 0.00–0.07)
Basophils Absolute: 0 10*3/uL (ref 0.0–0.1)
Basophils Relative: 0 %
Eosinophils Absolute: 0 10*3/uL (ref 0.0–0.5)
Eosinophils Relative: 0 %
HCT: 36.3 % — ABNORMAL LOW (ref 39.0–52.0)
Hemoglobin: 11.8 g/dL — ABNORMAL LOW (ref 13.0–17.0)
Immature Granulocytes: 1 %
Lymphocytes Relative: 6 %
Lymphs Abs: 0.8 10*3/uL (ref 0.7–4.0)
MCH: 30.8 pg (ref 26.0–34.0)
MCHC: 32.5 g/dL (ref 30.0–36.0)
MCV: 94.8 fL (ref 80.0–100.0)
Monocytes Absolute: 0.9 10*3/uL (ref 0.1–1.0)
Monocytes Relative: 6 %
Neutro Abs: 13.3 10*3/uL — ABNORMAL HIGH (ref 1.7–7.7)
Neutrophils Relative %: 87 %
Platelets: 366 10*3/uL (ref 150–400)
RBC: 3.83 MIL/uL — ABNORMAL LOW (ref 4.22–5.81)
RDW: 14.6 % (ref 11.5–15.5)
WBC: 15.2 10*3/uL — ABNORMAL HIGH (ref 4.0–10.5)
nRBC: 0 % (ref 0.0–0.2)

## 2023-05-30 LAB — BASIC METABOLIC PANEL
Anion gap: 9 (ref 5–15)
BUN: 37 mg/dL — ABNORMAL HIGH (ref 8–23)
CO2: 30 mmol/L (ref 22–32)
Calcium: 9.9 mg/dL (ref 8.9–10.3)
Chloride: 97 mmol/L — ABNORMAL LOW (ref 98–111)
Creatinine, Ser: 1.03 mg/dL (ref 0.61–1.24)
GFR, Estimated: >60 mL/min (ref >60)
Glucose, Bld: 102 mg/dL — ABNORMAL HIGH (ref 70–99)
Potassium: 4.5 mmol/L (ref 3.5–5.1)
Sodium: 136 mmol/L (ref 135–145)

## 2023-05-30 LAB — MAGNESIUM: Magnesium: 1.9 mg/dL (ref 1.7–2.4)

## 2023-05-30 NOTE — Progress Notes (Signed)
 Cardiologist:  Turner/Bensimohn  Subjective:  BP 97/56.  On  3L.  Net negative negative 800cc yesterday, negative 9.7L on admission. Cr stable at 1.03.  Reports dyspnea back to baseline  Objective:  Vitals:   05/29/23 1935 05/29/23 2020 05/30/23 0021 05/30/23 0525  BP:  (!) 91/54 111/69 (!) 97/56  Pulse:  76 74 67  Resp:  18 17 16   Temp:  97.6 F (36.4 C) 98 F (36.7 C) 97.9 F (36.6 C)  TempSrc:  Oral Oral Axillary  SpO2: 94% 93% 95% 99%  Weight:      Height:        Intake/Output from previous day:  Intake/Output Summary (Last 24 hours) at 05/30/2023 4098 Last data filed at 05/29/2023 2211 Gross per 24 hour  Intake 1057 ml  Output 1900 ml  Net -843 ml    Physical Exam:  General: Chronically ill, cachectic, hemodynamically stable, no acute distress HEENT: neck supple.  On nasal cannula oxygen Lungs: Decreased breath sounds bilaterally, no wheezes rales or rhonchi Heart: Regular, positive S1-S2, systolic murmur, no gallops or rubs Abdomen: Soft, nontender, nondistended, Extremities: Right lower extremity notes chronic venous stasis, no edema Neuro: Alert oriented x 4, no acute distress, moves all 4 extremities  Lab Results: Basic Metabolic Panel: Recent Labs    05/29/23 0230 05/30/23 0248  NA 135 136  K 4.0 4.5  CL 97* 97*  CO2 29 30  GLUCOSE 115* 102*  BUN 27* 37*  CREATININE 1.04 1.03  CALCIUM 9.7 9.9  MG 1.9 1.9    CBC: Recent Labs    05/29/23 0230 05/30/23 0248  WBC 16.3* 15.2*  NEUTROABS 14.5* 13.3*  HGB 11.8* 11.8*  HCT 36.7* 36.3*  MCV 94.1 94.8  PLT 377 366     Imaging: No results found.   Cardiac Studies: ECG: SR rate 93 RVH no acute ST changes   Telemetry: Sinus rhythm with PVCs.  Echo:  November 2024: LVEF 40-45%, global hypokinesis, right ventricular systolic function moderately reduced, size moderately dilated, biatrial enlargement, small pericardial effusion, see report for additional details  March 2025: LVEF  25-30%, right ventricular systolic function mildly reduced, right ventricular size normal, see report  Left and right heart catheterization: February 2024: Minimal CAD, moderate to severe PAH, severe nonischemic cardiomyopathy LVEF <20%  Medications:    apixaban  10 mg Oral BID   Followed by   Melene Muller ON 06/05/2023] apixaban  5 mg Oral BID   budesonide (PULMICORT) nebulizer solution  0.5 mg Nebulization BID   carvedilol  3.125 mg Oral BID WC   dapagliflozin propanediol  10 mg Oral Daily   doxycycline  100 mg Oral Q12H   feeding supplement  237 mL Oral BID BM   furosemide  40 mg Oral Daily   ipratropium-albuterol  3 mL Nebulization BID   losartan  25 mg Oral Daily   sildenafil  20 mg Oral TID        Assessment/Plan:  78 year old male with severe COPD, ongoing tobacco use, CAD, chronic systolic heart failure, colon cancer, lung cancer, prostate cancer, PE/DVT who we are consulted for acute on chronic HFrEF  Acute on chronic combined systolic and diastolic heart failure: Admitted 04/2022 with submassive PE requiring thrombectomy.  LVEF at that time found to be 20 to 25% and RV severely reduced on echo.  Cath with minimal CAD, moderate to severe pulmonary hypertension echo 01/24/2023 showed EF 40 to 45%.  Care has been complicated by noncompliance.  Admitted from advanced heart  failure clinic for volume overload on 05/24/23.  Echo this admission shows EF 25 to 30%, mild RV dysfunction -Appears euvolemic.  Will transition to PO lasix today -PCCM following for right pleural effusion, recommend hold off on thoracentesis -Continue losartan 25 mg daily, carvedilol 3.125 mg twice daily, farxiga 10 mg daily.  Off spironolactone due to hyperkalemia.  Acute PE/DVT: Recurrent, has been noncompliant with DOAC.  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  RHC with moderate to severe pulmonary hypertension.  Was on Revatio as outpatient but questionable compliance.  Restarted  this admission  Paroxysmal atrial fibrillation: Has been maintaining sinus rhythm.  Holding off on amiodarone given severe COPD.  Continue anticoagulation  [facility] will sign off.   Medication Recommendations: Eliquis, Coreg 3.125 mg twice daily, Lasix 40 mg daily, losartan 25 mg daily, Farxiga 10 mg daily, sildenafil 20 mg 3 times daily Other recommendations (labs, testing, etc): BMET in 1 week Follow up as an outpatient: We will schedule    Little Ishikawa, MD

## 2023-05-30 NOTE — Progress Notes (Signed)
 SATURATION QUALIFICATIONS: (This note is used to comply with regulatory documentation for home oxygen)  Patient Saturations on Room Air at Rest = 95% on 3L [place]  Patient Saturations on Room Air while Ambulating = 88% 3L [place]  Patient Saturations on 6 Liters of oxygen while Ambulating = 92%  Please briefly explain why patient needs home oxygen: patient on 3L nasal cannula at baseline. [place] while ambulating to 88%, took breaks w/ PLB and took 6L to sat back to 92%

## 2023-05-30 NOTE — Progress Notes (Signed)
 PROGRESS NOTE    Bruce [name]  ZHY:[id number] DOB: Aug 18, 1945 DOA: 05/24/2023 PCP: Bruce Kins, FNP   Brief Narrative: No notes on file   Assessment and Plan:  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sm Large pulmonary artery embolus involving the right main pulmonary artery with extension into the right middle and right lower lobe branches with resolution of previously seen left lower lobe pulmonary artery emboli. Secondary to nonadherence with anticoagulation. Associated BLE chronic DVTs. Patient started on a heparin drip. No right heart strain on CT and  Transthoracic Echocardiogram significant for mildly reduced right ventricular systolic function and normal right ventricular size.  Acute on chronic respiratory failure with hypoxia Secondary to acute PE, acute heart failure, and right pleural effusion.  Hypoxia improved with treatment of heart failure but patient still significantly hypoxic with ambulation requiring up to 6 L/min of supplemental oxygen.  Repeat chest x-ray obtained this evening and is significant for persistent right moderately sized pleural effusion  Acute on chronic combined heart failure Cardiology consulted.  Patient managed successfully with Lasix IV.  Echocardiogram obtained this admission significant for an LVEF of 25 to 30% with mild RV dysfunction.  Cardiology recommendations to transition to Lasix p.o. and have signed off. -Continue Lasix p.o.  Right pleural effusion Persistent.  Pulmonology consult on admission with recommendations to defer thoracentesis.  Patient with continued hypoxia. -IR thoracentesis  Severe pulmonary hypertension Noted.  Patient is nonadherent with outpatient regimen of sildenafil.  Sildenafil resumed this admission. -Continue sildenafil  COPD Noted.  Patient treated for exacerbation with prednisone and breathing treatments.  Completed prednisone burst. -Continue albuterol as needed  Tobacco use Counseled on  cessation.  Hyponatremia Mild.  Resolved.  Hypokalemia Resolved with potassium supplementation  Paroxysmal atrial fibrillation -Continue Eliquis and Coreg  Leukocytosis Unclear etiology. Neutrophil predominant. No associated infectious source.  Anemia of chronic disease Hemoglobin stable    DVT prophylaxis: Heparin IV Code Status:   Code Status: Do not attempt resuscitation (DNR) - Comfort care Family Communication: None at bedside Disposition Plan: Discharge home with home health therapy in 24 hours pending thoracentesis and ability to ambulate with improved oxygen use   Consultants:  Cardiology Palliative care Pulmonary/Critical care medicine  Procedures:    Antimicrobials:     Subjective: Patient reports improvement in dyspnea.  Eager to discharge.  Objective: BP (!) 97/56 (BP Location: Right Arm)   Pulse 67   Temp 97.9 F (36.6 C) (Axillary)   Resp 16   Ht 6\' 1"  (1.854 m)   Wt 65.2 kg   SpO2 99%   BMI 18.97 kg/m   Examination:  General exam: Appears calm and comfortable Respiratory system: Rhonchi. Respiratory effort normal. Cardiovascular system: S1 & S2 heard, RRR. No murmurs, rubs, gallops or clicks. Gastrointestinal system: Abdomen is nondistended, soft and nontender. No organomegaly or masses felt. Normal bowel sounds heard. Central nervous system: Alert and oriented. No focal neurological deficits. Musculoskeletal: No calf tenderness Psychiatry: Judgement and insight appear normal. Mood & affect appropriate.    Data Reviewed: I have personally reviewed following labs and imaging studies  CBC Lab Results  Component Value Date   WBC 15.2 (H) 05/30/2023   RBC 3.83 (L) 05/30/2023   HGB 11.8 (L) 05/30/2023   HCT 36.3 (L) 05/30/2023   MCV 94.8 05/30/2023   MCH 30.8 05/30/2023   PLT 366 05/30/2023   MCHC 32.5 05/30/2023   RDW 14.6 05/30/2023   LYMPHSABS 0.8 05/30/2023   MONOABS 0.9 05/30/2023   EOSABS  0.0 05/30/2023   BASOSABS 0.0  05/30/2023     Last metabolic panel Lab Results  Component Value Date   NA 136 05/30/2023   K 4.5 05/30/2023   CL 97 (L) 05/30/2023   CO2 30 05/30/2023   BUN 37 (H) 05/30/2023   CREATININE 1.03 05/30/2023   GLUCOSE 102 (H) 05/30/2023   GFRNONAA >60 05/30/2023   GFRAA 49 (L) 09/15/2019   CALCIUM 9.9 05/30/2023   PROT 6.8 04/19/2023   ALBUMIN 3.1 (L) 04/19/2023   LABGLOB 3.0 02/05/2023   BILITOT 1.2 04/19/2023   ALKPHOS 107 04/19/2023   AST 36 04/19/2023   ALT 25 04/19/2023   ANIONGAP 9 05/30/2023    GFR: Estimated Creatinine Clearance: 57.1 mL/min (by C-G formula based on SCr of 1.03 mg/dL).  No results found for this or any previous visit (from the past 240 hours).    Radiology Studies: No results found.    LOS: 6 days    Jacquelin Hawking, MD [facility] 05/30/2023, 7:34 AM   If 7PM-7AM, please contact night-coverage [email or web address]

## 2023-05-30 NOTE — Progress Notes (Signed)
 PT Cancellation Note  Patient Details Name: Bruce [name] MRN: [id number] DOB: [birth year]   Cancelled Treatment:    Reason Eval/Treat Not Completed: Other (comment)  Patient saturating 87-89% on 3L. Reports he is too short of breath from earlier walk and rt ankle is too sore to ambulate at this time. Reports at home he uses 3L at rest and 4L with activity. Unable to verify O2 needs due to pt refusal to ambulate at this time.    Jerolyn Center, PT Acute Rehabilitation Services  Office [phone]  Zena Amos 05/30/2023, 8:46 AM

## 2023-05-30 NOTE — Plan of Care (Signed)
  Problem: Education: Goal: Knowledge of General Education information will improve Description: Including pain rating scale, medication(s)/side effects and non-pharmacologic comfort measures Outcome: Progressing   Problem: Clinical Measurements: Goal: Will remain free from infection Outcome: Progressing Goal: Cardiovascular complication will be avoided Outcome: Progressing   Problem: Nutrition: Goal: Adequate nutrition will be maintained Outcome: Progressing   

## 2023-05-31 ENCOUNTER — Inpatient Hospital Stay (HOSPITAL_COMMUNITY)

## 2023-05-31 DIAGNOSIS — J90 Pleural effusion, not elsewhere classified: Secondary | ICD-10-CM | POA: Diagnosis not present

## 2023-05-31 DIAGNOSIS — I272 Pulmonary hypertension, unspecified: Secondary | ICD-10-CM | POA: Diagnosis not present

## 2023-05-31 DIAGNOSIS — I5043 Acute on chronic combined systolic (congestive) and diastolic (congestive) heart failure: Secondary | ICD-10-CM | POA: Diagnosis not present

## 2023-05-31 DIAGNOSIS — I2699 Other pulmonary embolism without acute cor pulmonale: Secondary | ICD-10-CM | POA: Diagnosis not present

## 2023-05-31 LAB — CBC WITH DIFFERENTIAL/PLATELET
Abs Immature Granulocytes: 0.1 10*3/uL — ABNORMAL HIGH (ref 0.00–0.07)
Basophils Absolute: 0 10*3/uL (ref 0.0–0.1)
Basophils Relative: 0 %
Eosinophils Absolute: 0 10*3/uL (ref 0.0–0.5)
Eosinophils Relative: 0 %
HCT: 38.2 % — ABNORMAL LOW (ref 39.0–52.0)
Hemoglobin: 12.4 g/dL — ABNORMAL LOW (ref 13.0–17.0)
Immature Granulocytes: 1 %
Lymphocytes Relative: 6 %
Lymphs Abs: 0.9 10*3/uL (ref 0.7–4.0)
MCH: 30.7 pg (ref 26.0–34.0)
MCHC: 32.5 g/dL (ref 30.0–36.0)
MCV: 94.6 fL (ref 80.0–100.0)
Monocytes Absolute: 0.7 10*3/uL (ref 0.1–1.0)
Monocytes Relative: 5 %
Neutro Abs: 13.1 10*3/uL — ABNORMAL HIGH (ref 1.7–7.7)
Neutrophils Relative %: 88 %
Platelets: 393 10*3/uL (ref 150–400)
RBC: 4.04 MIL/uL — ABNORMAL LOW (ref 4.22–5.81)
RDW: 14.9 % (ref 11.5–15.5)
WBC: 15 10*3/uL — ABNORMAL HIGH (ref 4.0–10.5)
nRBC: 0 % (ref 0.0–0.2)

## 2023-05-31 LAB — PROTEIN, PLEURAL OR PERITONEAL FLUID: Total protein, fluid: 3.3 g/dL

## 2023-05-31 LAB — BODY FLUID CELL COUNT WITH DIFFERENTIAL
Other Cells, Fluid: UNDETERMINED %
Total Nucleated Cell Count, Fluid: 33750 uL — ABNORMAL HIGH (ref 0–1000)

## 2023-05-31 LAB — LACTATE DEHYDROGENASE, PLEURAL OR PERITONEAL FLUID: LD, Fluid: >2500 U/L — ABNORMAL HIGH (ref 3–23)

## 2023-05-31 LAB — LACTATE DEHYDROGENASE: LDH: 177 U/L (ref 98–192)

## 2023-05-31 LAB — PROTEIN, TOTAL: Total Protein: 6.7 g/dL (ref 6.5–8.1)

## 2023-05-31 MED ORDER — LIDOCAINE HCL 1 % IJ SOLN
20.0000 mL | Freq: Once | INTRAMUSCULAR | Status: DC
  Filled 2023-05-31: qty 20

## 2023-05-31 MED ORDER — ORAL CARE MOUTH RINSE: 15.0000 mL | OROMUCOSAL | Status: DC | PRN

## 2023-05-31 MED ORDER — LIDOCAINE HCL 1 % IJ SOLN
INTRAMUSCULAR | Status: AC
  Filled 2023-05-31: qty 20

## 2023-05-31 NOTE — Progress Notes (Signed)
 Mobility Specialist Progress Note:   05/31/23 1100  Mobility  Activity Ambulated with assistance in hallway  Level of Assistance Contact guard assist, steadying assist  Assistive Device Front wheel walker  Distance Ambulated (ft) 150 ft  Activity Response Tolerated well  Mobility Referral Yes  Mobility visit 1 Mobility  Mobility Specialist Start Time (ACUTE ONLY) 1100  Mobility Specialist Stop Time (ACUTE ONLY) 1120  Mobility Specialist Time Calculation (min) (ACUTE ONLY) 20 min   Pt agreeable to mobility session. Required only minG assist to ambulate with RW. Required a bump up to 6LO2, however only maintained SpO2 mid-80s. Recovered with extended seated rest, back down to 3LO2, SpO2 90%.  Addison Lank Mobility Specialist Please contact via SecureChat or  Rehab office at [phone]

## 2023-05-31 NOTE — Progress Notes (Signed)
 OT Cancellation Note  Patient Details Name: ADRIAAN MALTESE MRN: [id number] DOB: Mar 30, 1945   Cancelled Treatment:    Reason Eval/Treat Not Completed: Patient declined, no reason specified;Other (comment) Attempted to engage pt in OT session and provided various options though pt reports he has moved around enough today and "about ready to get dressed and get out of here".  [name] 05/31/2023, 12:50 PM

## 2023-05-31 NOTE — Procedures (Signed)
 PROCEDURE SUMMARY:  Successful US guided right thoracentesis. Yielded 230 mL of cloudy, orange fluid. Patient tolerated procedure well. No immediate complications. EBL = trace  Specimen was sent for labs.  Post procedure chest X-ray reveals no pneumothorax  Loman Brooklyn PA-C 05/31/2023 9:45 AM

## 2023-05-31 NOTE — Care Management Important Message (Signed)
 Important Message  Patient Details  Name: Bruce [name] MRN: [id number] Date of Birth: 1945-07-28   Important Message Given:  Yes - Medicare IM     Renie Ora 05/31/2023, 8:36 AM

## 2023-05-31 NOTE — Progress Notes (Signed)
 PROGRESS NOTE    Bruce [name]  WGN:[id number] DOB: 30-Aug-1945 DOA: 05/24/2023 PCP: Miki Kins, FNP   Brief Narrative: Bruce [name] is a 78 y.o. male with a history of COPD, chronic respiratory failure on 3 L/min of oxygen, bilateral PE status post thrombolysis, CAD, paroxysmal atrial fibrillation, pulm hypertension, chronic systolic heart failure, medication nonadherence, tobacco use.  Patient presented secondary to worsening shortness of breath and chest discomfort and was found to have evidence of right mainstem PE in addition to acute on chronic heart failure.  Patient was started on heparin IV in addition to Lasix diuresis.  Cardiology was consulted for assistance in management.  Pulmonology was also consulted for assistance in management with recommendation to manage heart failure.  IV diuresis completed.  Patient still with significant hypoxia with ambulation.   Assessment and Plan:  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sm Large pulmonary artery embolus involving the right main pulmonary artery with extension into the right middle and right lower lobe branches with resolution of previously seen left lower lobe pulmonary artery emboli. Secondary to nonadherence with anticoagulation. Associated BLE chronic DVTs. Patient started on a heparin drip. No right heart strain on CT and  Transthoracic Echocardiogram significant for mildly reduced right ventricular systolic function and normal right ventricular size.  Acute on chronic respiratory failure with hypoxia Secondary to acute PE, acute heart failure, and right pleural effusion.  Hypoxia improved with treatment of heart failure but patient still significantly hypoxic with ambulation requiring up to 6 L/min of supplemental oxygen.  Repeat chest x-ray obtained on 3/26 significant for persistent right moderately sized pleural effusion. Patient underwent thoracentesis on 3/27.  Acute on chronic combined heart failure Cardiology consulted.   Patient managed successfully with Lasix IV.  Echocardiogram obtained this admission significant for an LVEF of 25 to 30% with mild RV dysfunction.  Cardiology recommendations to transition to Lasix p.o. and have signed off. -Continue Lasix p.o.  Right pleural effusion Persistent.  Pulmonology consult on admission with recommendations to defer thoracentesis.  Patient with continued hypoxia. Thoracentesis ordered and performed on 3/27 with improvement of dyspnea. -Follow-up pleural fluid labs  Severe pulmonary hypertension Noted.  Patient is nonadherent with outpatient regimen of sildenafil.  Sildenafil resumed this admission. -Continue sildenafil  COPD Noted.  Patient treated for exacerbation with prednisone and breathing treatments.  Completed prednisone burst. -Continue albuterol as needed  Tobacco use Counseled on cessation.  Hyponatremia Mild.  Resolved.  Hypokalemia Resolved with potassium supplementation  Paroxysmal atrial fibrillation -Continue Eliquis and Coreg  Leukocytosis Unclear etiology. Neutrophil predominant. No associated infectious source.  Anemia of chronic disease Hemoglobin stable    DVT prophylaxis: Heparin IV Code Status:   Code Status: Do not attempt resuscitation (DNR) - Comfort care Family Communication: None at bedside Disposition Plan: Discharge home with home health therapy in 24 hours pending thoracentesis and ability to ambulate with improved oxygen use   Consultants:  Cardiology Palliative care Pulmonary/Critical care medicine  Procedures:  Right-sided thoracentesis  Antimicrobials: Doxycycline    Subjective: Improvement of dyspnea after thoracentesis. Patient with significant hypoxia with attempts at ambulation, requiring 6 L/min to keep oxygen in high 80% range.  Objective: BP 116/62 (BP Location: Left Arm)   Pulse 74   Temp 98 F (36.7 C) (Oral)   Resp 20   Ht 6\' 1"  (1.854 m)   Wt 64.9 kg   SpO2 94%   BMI 18.88 kg/m    Examination:  General exam: Appears calm and comfortable Respiratory  system: Diminished. Respiratory effort normal. Cardiovascular system: S1 & S2 heard, RRR. Gastrointestinal system: Abdomen is nondistended, soft and nontender. Normal bowel sounds heard. Central nervous system: Alert and oriented. No focal neurological deficits. Musculoskeletal: No edema. No calf tenderness Psychiatry: Judgement and insight appear normal. Mood & affect appropriate.     Data Reviewed: I have personally reviewed following labs and imaging studies  CBC Lab Results  Component Value Date   WBC 15.0 (H) 05/31/2023   RBC 4.04 (L) 05/31/2023   HGB 12.4 (L) 05/31/2023   HCT 38.2 (L) 05/31/2023   MCV 94.6 05/31/2023   MCH 30.7 05/31/2023   PLT 393 05/31/2023   MCHC 32.5 05/31/2023   RDW 14.9 05/31/2023   LYMPHSABS 0.9 05/31/2023   MONOABS 0.7 05/31/2023   EOSABS 0.0 05/31/2023   BASOSABS 0.0 05/31/2023     Last metabolic panel Lab Results  Component Value Date   NA 136 05/30/2023   K 4.5 05/30/2023   CL 97 (L) 05/30/2023   CO2 30 05/30/2023   BUN 37 (H) 05/30/2023   CREATININE 1.03 05/30/2023   GLUCOSE 102 (H) 05/30/2023   GFRNONAA >60 05/30/2023   GFRAA 49 (L) 09/15/2019   CALCIUM 9.9 05/30/2023   PROT 6.7 05/31/2023   ALBUMIN 3.1 (L) 04/19/2023   LABGLOB 3.0 02/05/2023   BILITOT 1.2 04/19/2023   ALKPHOS 107 04/19/2023   AST 36 04/19/2023   ALT 25 04/19/2023   ANIONGAP 9 05/30/2023    GFR: Estimated Creatinine Clearance: 56.9 mL/min (by C-G formula based on SCr of 1.03 mg/dL).  Recent Results (from the past 240 hours)  Body fluid culture w Gram Stain     Status: None (Preliminary result)   Collection Time: 05/31/23  9:58 AM   Specimen: Lung, Right; Pleural Fluid  Result Value Ref Range Status   Specimen Description PLEURAL  Final   Special Requests right lung  Final   Gram Stain   Final    MODERATE WBC PRESENT, PREDOMINANTLY PMN RARE GRAM POSITIVE COCCI IN  PAIRS Performed at [facility], [address], [place], [place] [address]    Culture PENDING  Incomplete   Report Status PENDING  Incomplete      Radiology Studies: DG Chest 1 View Result Date: 05/31/2023 CLINICAL DATA:  Status post right thoracentesis. EXAM: CHEST  1 VIEW COMPARISON:  May 30, 2023. FINDINGS: No definite pneumothorax is noted. Grossly stable loculated right pleural effusion is noted. IMPRESSION: No definite pneumothorax is noted status post thoracentesis. Electronically Signed   By: Lupita Raider M.D.   On: 05/31/2023 13:53   IR THORACENTESIS ASP PLEURAL SPACE W/IMG GUIDE Result Date: 05/31/2023 INDICATION: 78 year old male with right pleural effusion for therapeutic and diagnostic paracentesis. EXAM: ULTRASOUND GUIDED RIGHT THORACENTESIS MEDICATIONS: 20 mL 1% Lidocaine COMPLICATIONS: None immediate. PROCEDURE: An ultrasound guided thoracentesis was thoroughly discussed with the patient and questions answered. The benefits, risks, alternatives and complications were also discussed. The patient understands and wishes to proceed with the procedure. Written consent was obtained. Ultrasound was performed to localize and mark an adequate pocket of fluid in the right chest. The area was then prepped and draped in the normal sterile fashion. 1% Lidocaine was used for local anesthesia. Under ultrasound guidance a 8 Fr Safe-T-Centesis catheter was introduced. Thoracentesis was performed. The catheter was removed and a dressing applied. FINDINGS: A total of approximately 230 mL of cloudy, orange fluid was removed. Samples were sent to the laboratory as requested by the  clinical team. IMPRESSION: Successful ultrasound guided right thoracentesis yielding 230 mL of pleural fluid. Performed By Theresa Mulligan, PA-C Electronically Signed   By: Marliss Coots M.D.   On: 05/31/2023 10:36   DG CHEST PORT 1 VIEW Result Date: 05/30/2023 CLINICAL DATA:  Hypoxia EXAM: PORTABLE CHEST 1 VIEW  COMPARISON:  Chest x-ray 05/27/2023.  Chest CT 05/24/2023. FINDINGS: Moderate-sized right pleural effusion is unchanged. Strandy opacities in the right mid lung are unchanged. Left lung is grossly clear. Cardiac silhouette is mildly enlarged similar to the prior study. Enlarged right main pulmonary artery is again noted. No pneumothorax or acute fracture. IMPRESSION: 1. Stable moderate-sized right pleural effusion. 2. Stable Strandy opacities in the right mid lung. Electronically Signed   By: Darliss Cheney M.D.   On: 05/30/2023 18:14      LOS: 7 days    Jacquelin Hawking, MD [facility] 05/31/2023, 4:05 PM   If 7PM-7AM, please contact night-coverage [email or web address]

## 2023-05-31 NOTE — Progress Notes (Signed)
 OT Cancellation Note  Patient Details Name: Bruce [name] MRN: [id number] DOB: [birth year]   Cancelled Treatment:    Reason Eval/Treat Not Completed: Patient at procedure or test/ unavailable  [name] 05/31/2023, 9:55 AM

## 2023-05-31 NOTE — TOC Progression Note (Addendum)
 Transition of Care [facility]) - Progression Note    Patient Details  Name: Bruce [name] MRN: [id number] Date of Birth: 04/12/1945  Transition [facility] CM/SW Contact  Leone Haven, RN Phone Number: 05/31/2023, 3:48 PM  Clinical Narrative:    For IR for thoracentesis today.  He still does not want HH services at dc.  He has home oxygen already.     Expected Discharge Plan: Home w Home Health Services Barriers to Discharge: Continued Medical Work up  Expected Discharge Plan and Services In-house Referral: NA Discharge Planning Services: CM Consult Post Acute Care Choice: NA Living arrangements for the past 2 months: Mobile Home                 DME Arranged: N/A DME Agency: NA       HH Arranged: PT, Patient Refused HH           Social Determinants of Health (SDOH) Interventions SDOH Screenings   Food Insecurity: No Food Insecurity (05/25/2023)  Recent Concern: Food Insecurity - Food Insecurity Present (05/24/2023)  Housing: Low Risk  (05/25/2023)  Recent Concern: Housing - High Risk (04/19/2023)  Transportation Needs: Unmet Transportation Needs (05/25/2023)  Utilities: Not At Risk (05/25/2023)  Financial Resource Strain: High Risk (04/12/2022)  Physical Activity: Inactive (02/05/2017)  Social Connections: Unknown (05/25/2023)  Stress: Stress Concern Present (02/05/2017)  Tobacco Use: Medium Risk (05/24/2023)    Readmission Risk Interventions    05/28/2023    5:06 PM  Readmission Risk Prevention Plan  Transportation Screening Complete  PCP or Specialist Appt within 5-7 Days Complete  Home Care Screening Complete  Medication Review (RN CM) Complete

## 2023-05-31 NOTE — Hospital Course (Addendum)
 Bruce [name] is a 78 y.o. male with a history of COPD, chronic respiratory failure on 3 L/min of oxygen, bilateral PE status post thrombolysis, CAD, paroxysmal atrial fibrillation, pulm hypertension, chronic systolic heart failure, medication nonadherence, tobacco use.  Patient presented secondary to worsening shortness of breath and chest discomfort and was found to have evidence of right mainstem PE in addition to acute on chronic heart failure.  Patient was started on heparin IV in addition to Lasix diuresis.  Cardiology was consulted for assistance in management.  Pulmonology was also consulted for assistance in management with recommendation to manage heart failure.  IV diuresis completed.  Patient still with significant hypoxia with ambulation. Pleural fluid labs consistent with empyema.

## 2023-05-31 NOTE — Progress Notes (Signed)
 PT Cancellation/Discharge Note  Patient Details Name: Bruce [name] MRN: [id number] DOB: April 21, 1945   Cancelled Treatment:    Reason Eval/Treat Not Completed: Other (comment)  Patient refused to participate stating he walked earlier (see Mobility Team note). Refused exercise. Based on pt's multiple refusals over past 3 days, will discharge from PT at this time. Hopeful pt will continue to work with Mobility Team.   Patient is being discharged from PT services secondary to:    Patient has refused 3 consecutive times without medical reason.    Please see latest Therapy Progress Note for current level of functioning and progress toward goals.  Progress and discharge plan and discussed with patient/caregiver and they  Agree   Jerolyn Center, PT Acute Rehabilitation Services  Office [phone]  Zena Amos 05/31/2023, 1:36 PM

## 2023-05-31 NOTE — Progress Notes (Addendum)
 1209- R lateral ankle wound dressing removed to complete wound care. Patient insistent that dressing not be re-applied and wants it open to air to "air out".  Nursing discussed with patient to importance of wound care and encouraged patient to consider dressing re-application. Patient still refusing dressing change.  Will revisit wound care at a later time. Wound remains open to air with no dressing.   1345- Patient  now agreeable to applying dressing. Wound cleansed with vashe cleanser, vashe soaked sterile gauze w/ foam dressing applied.

## 2023-05-31 NOTE — Progress Notes (Signed)
 Nurse requested Mobility Specialist to perform oxygen saturation test with pt which includes removing pt from oxygen both at rest and while ambulating.  Below are the results from that testing.     Patient Saturations on Room Air at Rest = spO2 89%  Patient Saturations on Room Air while Ambulating = sp02 83% .  Patient Saturations on 6 Liters of oxygen while Ambulating = sp02 87%  At end of testing pt left in room on 3  Liters of oxygen.  Reported results to nurse.   Addison Lank Mobility Specialist Please contact via SecureChat or  Rehab office at [phone]

## 2023-06-01 ENCOUNTER — Ambulatory Visit: Payer: 59 | Admitting: Family

## 2023-06-01 DIAGNOSIS — I5043 Acute on chronic combined systolic (congestive) and diastolic (congestive) heart failure: Secondary | ICD-10-CM | POA: Diagnosis not present

## 2023-06-01 DIAGNOSIS — I272 Pulmonary hypertension, unspecified: Secondary | ICD-10-CM | POA: Diagnosis not present

## 2023-06-01 DIAGNOSIS — J90 Pleural effusion, not elsewhere classified: Secondary | ICD-10-CM | POA: Diagnosis not present

## 2023-06-01 DIAGNOSIS — I2699 Other pulmonary embolism without acute cor pulmonale: Secondary | ICD-10-CM | POA: Diagnosis not present

## 2023-06-01 LAB — BASIC METABOLIC PANEL WITH GFR
Anion gap: 7 (ref 5–15)
BUN: 38 mg/dL — ABNORMAL HIGH (ref 8–23)
CO2: 31 mmol/L (ref 22–32)
Calcium: 9.1 mg/dL (ref 8.9–10.3)
Chloride: 98 mmol/L (ref 98–111)
Creatinine, Ser: 1.12 mg/dL (ref 0.61–1.24)
GFR, Estimated: >60 mL/min (ref >60)
Glucose, Bld: 84 mg/dL (ref 70–99)
Potassium: 4.1 mmol/L (ref 3.5–5.1)
Sodium: 136 mmol/L (ref 135–145)

## 2023-06-01 LAB — CBC WITH DIFFERENTIAL/PLATELET
Abs Immature Granulocytes: 0.05 10*3/uL (ref 0.00–0.07)
Basophils Absolute: 0 10*3/uL (ref 0.0–0.1)
Basophils Relative: 0 %
Eosinophils Absolute: 0.1 10*3/uL (ref 0.0–0.5)
Eosinophils Relative: 1 %
HCT: 37.1 % — ABNORMAL LOW (ref 39.0–52.0)
Hemoglobin: 12 g/dL — ABNORMAL LOW (ref 13.0–17.0)
Immature Granulocytes: 1 %
Lymphocytes Relative: 11 %
Lymphs Abs: 1.1 10*3/uL (ref 0.7–4.0)
MCH: 30.2 pg (ref 26.0–34.0)
MCHC: 32.3 g/dL (ref 30.0–36.0)
MCV: 93.2 fL (ref 80.0–100.0)
Monocytes Absolute: 0.5 10*3/uL (ref 0.1–1.0)
Monocytes Relative: 6 %
Neutro Abs: 7.9 10*3/uL — ABNORMAL HIGH (ref 1.7–7.7)
Neutrophils Relative %: 81 %
Platelets: 363 10*3/uL (ref 150–400)
RBC: 3.98 MIL/uL — ABNORMAL LOW (ref 4.22–5.81)
RDW: 14.8 % (ref 11.5–15.5)
WBC: 9.6 10*3/uL (ref 4.0–10.5)
nRBC: 0 % (ref 0.0–0.2)

## 2023-06-01 LAB — CYTOLOGY - NON PAP

## 2023-06-01 MED ORDER — VANCOMYCIN HCL 1250 MG/250ML IV SOLN
1250.0000 mg | INTRAVENOUS | Status: DC
  Administered 2023-06-01 – 2023-06-02 (×2): 1250 mg via INTRAVENOUS
  Filled 2023-06-01 (×2): qty 250

## 2023-06-01 NOTE — Progress Notes (Signed)
 PROGRESS NOTE    Bruce [name]  AVW:[id number] DOB: Oct 04, 1945 DOA: 05/24/2023 PCP: Miki Kins, FNP   Brief Narrative: Bruce [name] is a 78 y.o. male with a history of COPD, chronic respiratory failure on 3 L/min of oxygen, bilateral PE status post thrombolysis, CAD, paroxysmal atrial fibrillation, pulm hypertension, chronic systolic heart failure, medication nonadherence, tobacco use.  Patient presented secondary to worsening shortness of breath and chest discomfort and was found to have evidence of right mainstem PE in addition to acute on chronic heart failure.  Patient was started on heparin IV in addition to Lasix diuresis.  Cardiology was consulted for assistance in management.  Pulmonology was also consulted for assistance in management with recommendation to manage heart failure.  IV diuresis completed.  Patient still with significant hypoxia with ambulation.   Assessment and Plan:  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sm Large pulmonary artery embolus involving the right main pulmonary artery with extension into the right middle and right lower lobe branches with resolution of previously seen left lower lobe pulmonary artery emboli. Secondary to nonadherence with anticoagulation. Associated BLE chronic DVTs. Patient started on a heparin drip. No right heart strain on CT and  Transthoracic Echocardiogram significant for mildly reduced right ventricular systolic function and normal right ventricular size.  Acute on chronic respiratory failure with hypoxia Secondary to acute PE, acute heart failure, and right pleural effusion.  Hypoxia improved with treatment of heart failure but patient still significantly hypoxic with ambulation requiring up to 6 L/min of supplemental oxygen.  Repeat chest x-ray obtained on 3/26 significant for persistent right moderately sized pleural effusion. Patient underwent thoracentesis on 3/27. Still requiring significant increase in oxygen with  ambulation.  Acute on chronic combined heart failure Cardiology consulted.  Patient managed successfully with Lasix IV.  Echocardiogram obtained this admission significant for an LVEF of 25 to 30% with mild RV dysfunction.  Cardiology recommendations to transition to Lasix p.o. and have signed off. -Continue Lasix p.o.  Right empyema Persistent.  Pulmonology consult on admission with recommendations to defer thoracentesis.  Patient with continued hypoxia. Thoracentesis ordered and performed on 3/27 with improvement of dyspnea. Pleural fluid shows concern for infection with gram stain significant for gram positive cocci consistent with empyema. Patient has been on doxycycline for 7 days. -Follow-up pleural fluid labs -Vancomycin IV -Discussed with pulmonology with recommendations for repeat chest x-ray in 3 days and formal consult for chest tube if fluid has increased  Severe pulmonary hypertension Noted.  Patient is nonadherent with outpatient regimen of sildenafil.  Sildenafil resumed this admission. -Continue sildenafil  COPD Noted.  Patient treated for exacerbation with prednisone and breathing treatments.  Completed prednisone burst. -Continue albuterol as needed  Tobacco use Counseled on cessation.  Hyponatremia Mild.  Resolved.  Hypokalemia Resolved with potassium supplementation  Paroxysmal atrial fibrillation -Continue Eliquis and Coreg  Leukocytosis Unclear etiology. Neutrophil predominant. No associated infectious source.  Anemia of chronic disease Hemoglobin stable    DVT prophylaxis: Heparin IV Code Status:   Code Status: Do not attempt resuscitation (DNR) - Comfort care Family Communication: None at bedside Disposition Plan: Discharge in 4-5 days pending stable chest x-ray and transition to outpatient antibiotics for empyema   Consultants:  Cardiology Palliative care Pulmonary/Critical care medicine  Procedures:  Right-sided  thoracentesis  Antimicrobials: Doxycycline    Subjective: No chest pain or dyspnea.  Objective: BP 117/78 (BP Location: Left Arm)   Pulse 71   Temp 97.7 F (36.5 C) (Oral)  Resp 20   Ht 6\' 1"  (1.854 m)   Wt 64.3 kg   SpO2 98%   BMI 18.70 kg/m   Examination:  General exam: Appears calm and comfortable Respiratory system: diminished breath sounds. Respiratory effort normal. Cardiovascular system: S1 & S2 heard, RRR. No murmurs, rubs, gallops or clicks. Gastrointestinal system: Abdomen is nondistended, soft and nontender. Normal bowel sounds heard. Central nervous system: Alert and oriented. No focal neurological deficits. Musculoskeletal: No edema. No calf tenderness Psychiatry: Judgement and insight appear normal. Mood & affect appropriate.    Data Reviewed: I have personally reviewed following labs and imaging studies  CBC Lab Results  Component Value Date   WBC 9.6 06/01/2023   RBC 3.98 (L) 06/01/2023   HGB 12.0 (L) 06/01/2023   HCT 37.1 (L) 06/01/2023   MCV 93.2 06/01/2023   MCH 30.2 06/01/2023   PLT 363 06/01/2023   MCHC 32.3 06/01/2023   RDW 14.8 06/01/2023   LYMPHSABS 1.1 06/01/2023   MONOABS 0.5 06/01/2023   EOSABS 0.1 06/01/2023   BASOSABS 0.0 06/01/2023     Last metabolic panel Lab Results  Component Value Date   NA 136 06/01/2023   K 4.1 06/01/2023   CL 98 06/01/2023   CO2 31 06/01/2023   BUN 38 (H) 06/01/2023   CREATININE 1.12 06/01/2023   GLUCOSE 84 06/01/2023   GFRNONAA >60 06/01/2023   GFRAA 49 (L) 09/15/2019   CALCIUM 9.1 06/01/2023   PROT 6.7 05/31/2023   ALBUMIN 3.1 (L) 04/19/2023   LABGLOB 3.0 02/05/2023   BILITOT 1.2 04/19/2023   ALKPHOS 107 04/19/2023   AST 36 04/19/2023   ALT 25 04/19/2023   ANIONGAP 7 06/01/2023    GFR: Estimated Creatinine Clearance: 51.8 mL/min (by C-G formula based on SCr of 1.12 mg/dL).  Recent Results (from the past 240 hours)  Body fluid culture w Gram Stain     Status: None (Preliminary  result)   Collection Time: 05/31/23  9:58 AM   Specimen: Lung, Right; Pleural Fluid  Result Value Ref Range Status   Specimen Description PLEURAL  Final   Special Requests right lung  Final   Gram Stain   Final    MODERATE WBC PRESENT, PREDOMINANTLY PMN RARE GRAM POSITIVE COCCI IN PAIRS    Culture   Final    CULTURE REINCUBATED FOR BETTER GROWTH Performed at [facility], [address], [place], [place] [address]    Report Status PENDING  Incomplete      Radiology Studies: DG Chest 1 View Result Date: 05/31/2023 CLINICAL DATA:  Status post right thoracentesis. EXAM: CHEST  1 VIEW COMPARISON:  May 30, 2023. FINDINGS: No definite pneumothorax is noted. Grossly stable loculated right pleural effusion is noted. IMPRESSION: No definite pneumothorax is noted status post thoracentesis. Electronically Signed   By: Lupita Raider M.D.   On: 05/31/2023 13:53   IR THORACENTESIS ASP PLEURAL SPACE W/IMG GUIDE Result Date: 05/31/2023 INDICATION: 78 year old male with right pleural effusion for therapeutic and diagnostic paracentesis. EXAM: ULTRASOUND GUIDED RIGHT THORACENTESIS MEDICATIONS: 20 mL 1% Lidocaine COMPLICATIONS: None immediate. PROCEDURE: An ultrasound guided thoracentesis was thoroughly discussed with the patient and questions answered. The benefits, risks, alternatives and complications were also discussed. The patient understands and wishes to proceed with the procedure. Written consent was obtained. Ultrasound was performed to localize and mark an adequate pocket of fluid in the right chest. The area was then prepped and draped in the normal sterile fashion. 1% Lidocaine was used  for local anesthesia. Under ultrasound guidance a 8 Fr Safe-T-Centesis catheter was introduced. Thoracentesis was performed. The catheter was removed and a dressing applied. FINDINGS: A total of approximately 230 mL of cloudy, orange fluid was removed. Samples were sent to the laboratory as requested by the  clinical team. IMPRESSION: Successful ultrasound guided right thoracentesis yielding 230 mL of pleural fluid. Performed By Theresa Mulligan, PA-C Electronically Signed   By: Marliss Coots M.D.   On: 05/31/2023 10:36      LOS: 8 days    Jacquelin Hawking, MD [facility] 06/01/2023, 2:58 PM   If 7PM-7AM, please contact night-coverage [email or web address]

## 2023-06-01 NOTE — Progress Notes (Signed)
 Spoke to Dr. Caleb Popp re: recent thoracentesis yielding findings consistent with empyema antibiotics have been started.  He had follow-up chest x-ray reviewed from yesterday shows minimal residual loculated fluid collection Plan Would continue antibiotics Continue to follow over the weekend, with x-ray either Sunday or Monday.  If fluid has reaccumulated he will need chest tube, in which case medical team knows to call us

## 2023-06-01 NOTE — Progress Notes (Signed)
 OT Cancellation Note  Patient Details Name: Bruce [name] MRN: [id number] DOB: March 14, 1945   Cancelled Treatment:    Reason Eval/Treat Not Completed: Patient declined, no reason specified; Highly focused on wanting to discharge to plow his garden.   [name] 06/01/2023, 9:27 AM Berna Spare, OTR/L Acute Rehabilitation Services Office: [phone]

## 2023-06-01 NOTE — Plan of Care (Signed)
  Problem: Education: Goal: Knowledge of General Education information will improve Description: Including pain rating scale, medication(s)/side effects and non-pharmacologic comfort measures Outcome: Progressing   Problem: Clinical Measurements: Goal: Diagnostic test results will improve Outcome: Progressing   Problem: Clinical Measurements: Goal: Will remain free from infection Outcome: Progressing   Problem: Clinical Measurements: Goal: Respiratory complications will improve Outcome: Progressing   Problem: Clinical Measurements: Goal: Cardiovascular complication will be avoided Outcome: Progressing

## 2023-06-01 NOTE — Progress Notes (Signed)
 Pharmacy Antibiotic Note  Bruce [name] is a 78 y.o. male admitted on 05/24/2023 with empyema. Pharmacy has been consulted for vancomycin dosing. Cr up slightly today. Pleural fluid with GPCs but still pending speciation.  Plan: Vancomycin 1250mg  IV q24h - est AUC 495 Follow pleural fluid Cx results Follow Cr, LOT  Height: 6\' 1"  (185.4 cm) Weight: 64.3 kg (141 lb 12.1 oz) IBW/kg (Calculated) : 79.9  Temp (24hrs), Avg:98 F (36.7 C), Min:97.4 F (36.3 C), Max:98.5 F (36.9 C)  Recent Labs  Lab 05/27/23 0230 05/28/23 0304 05/29/23 0230 05/30/23 0248 05/31/23 0248 06/01/23 0250  WBC 15.0* 14.1* 16.3* 15.2* 15.0* 9.6  CREATININE 1.05 1.08 1.04 1.03  --  1.12    Estimated Creatinine Clearance: 51.8 mL/min (by C-G formula based on SCr of 1.12 mg/dL).    Allergies  Allergen Reactions   Adhesive [Tape] Other (See Comments)    After right leg fracture surgery, pt developed a large blister where tape was applied to his right leg. OK to use paper tape.   Eggs-Apples-Oats [Alitraq] Other (See Comments)    Caused chest pains   Imdur [Isosorbide Dinitrate] Other (See Comments)    hallucinations   Singulair [Montelukast Sodium] Other (See Comments)    Hallucinations      Fredonia Highland, PharmD, BCPS, [facility] Clinical Pharmacist [phone] Please check AMION for all [facility] Pharmacy numbers 06/01/2023

## 2023-06-01 NOTE — Progress Notes (Signed)
 Pt asked to ambulate with O2 for walk test he agreed but had reasons not to 5 times the 6th time he agreed to ambulate and seemed eager to try.  O2@6l /min placed pt used his walked , by the time he got to the door his SpO2 was 77%, after rest he rebounded  to low 80's 82-83%  after ambulation and 5 minutes of rest SpO2 was 93% on 4L/[place] sitting in bed eating a strawberry ice cream, mentioned that he could not find his charged so this RN looked for it in the chair with his belongings which included several trays of home medications pt became extremerly irate and demanded this RN leave his belongings alone . Immediately stopped looking for pt's charger and left his belongings alone.  Apologized to pt and left room ewhile pt was cursing

## 2023-06-01 NOTE — Plan of Care (Signed)
   Problem: Education: Goal: Knowledge of General Education information will improve Description: Including pain rating scale, medication(s)/side effects and non-pharmacologic comfort measures Outcome: Not Progressing

## 2023-06-02 DIAGNOSIS — I5043 Acute on chronic combined systolic (congestive) and diastolic (congestive) heart failure: Secondary | ICD-10-CM | POA: Diagnosis not present

## 2023-06-02 DIAGNOSIS — J90 Pleural effusion, not elsewhere classified: Secondary | ICD-10-CM | POA: Diagnosis not present

## 2023-06-02 DIAGNOSIS — I272 Pulmonary hypertension, unspecified: Secondary | ICD-10-CM | POA: Diagnosis not present

## 2023-06-02 DIAGNOSIS — I2699 Other pulmonary embolism without acute cor pulmonale: Secondary | ICD-10-CM | POA: Diagnosis not present

## 2023-06-02 LAB — BASIC METABOLIC PANEL WITH GFR
Anion gap: 11 (ref 5–15)
BUN: 30 mg/dL — ABNORMAL HIGH (ref 8–23)
CO2: 26 mmol/L (ref 22–32)
Calcium: 9.1 mg/dL (ref 8.9–10.3)
Chloride: 99 mmol/L (ref 98–111)
Creatinine, Ser: 0.9 mg/dL (ref 0.61–1.24)
GFR, Estimated: >60 mL/min (ref >60)
Glucose, Bld: 136 mg/dL — ABNORMAL HIGH (ref 70–99)
Potassium: 3.8 mmol/L (ref 3.5–5.1)
Sodium: 136 mmol/L (ref 135–145)

## 2023-06-02 LAB — CBC WITH DIFFERENTIAL/PLATELET
Abs Immature Granulocytes: 0.09 10*3/uL — ABNORMAL HIGH (ref 0.00–0.07)
Basophils Absolute: 0 10*3/uL (ref 0.0–0.1)
Basophils Relative: 0 %
Eosinophils Absolute: 0.2 10*3/uL (ref 0.0–0.5)
Eosinophils Relative: 2 %
HCT: 36.3 % — ABNORMAL LOW (ref 39.0–52.0)
Hemoglobin: 11.7 g/dL — ABNORMAL LOW (ref 13.0–17.0)
Immature Granulocytes: 1 %
Lymphocytes Relative: 8 %
Lymphs Abs: 0.9 10*3/uL (ref 0.7–4.0)
MCH: 30.2 pg (ref 26.0–34.0)
MCHC: 32.2 g/dL (ref 30.0–36.0)
MCV: 93.8 fL (ref 80.0–100.0)
Monocytes Absolute: 0.7 10*3/uL (ref 0.1–1.0)
Monocytes Relative: 6 %
Neutro Abs: 9.7 10*3/uL — ABNORMAL HIGH (ref 1.7–7.7)
Neutrophils Relative %: 83 %
Platelets: 361 10*3/uL (ref 150–400)
RBC: 3.87 MIL/uL — ABNORMAL LOW (ref 4.22–5.81)
RDW: 15.4 % (ref 11.5–15.5)
WBC: 11.6 10*3/uL — ABNORMAL HIGH (ref 4.0–10.5)
nRBC: 0 % (ref 0.0–0.2)

## 2023-06-02 MED ORDER — HYDROCERIN EX CREA
TOPICAL_CREAM | Freq: Two times a day (BID) | CUTANEOUS | Status: DC
  Administered 2023-06-04: 1 via TOPICAL
  Filled 2023-06-02 (×2): qty 113

## 2023-06-02 NOTE — Progress Notes (Signed)
 PROGRESS NOTE    Bruce [name]  WUJ:[id number] DOB: 21-Jan-1946 DOA: 05/24/2023 PCP: Miki Kins, FNP   Brief Narrative: Bruce [name] is a 78 y.o. male with a history of COPD, chronic respiratory failure on 3 L/min of oxygen, bilateral PE status post thrombolysis, CAD, paroxysmal atrial fibrillation, pulm hypertension, chronic systolic heart failure, medication nonadherence, tobacco use.  Patient presented secondary to worsening shortness of breath and chest discomfort and was found to have evidence of right mainstem PE in addition to acute on chronic heart failure.  Patient was started on heparin IV in addition to Lasix diuresis.  Cardiology was consulted for assistance in management.  Pulmonology was also consulted for assistance in management with recommendation to manage heart failure.  IV diuresis completed.  Patient still with significant hypoxia with ambulation.   Assessment and Plan:  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sm Large pulmonary artery embolus involving the right main pulmonary artery with extension into the right middle and right lower lobe branches with resolution of previously seen left lower lobe pulmonary artery emboli. Secondary to nonadherence with anticoagulation. Associated BLE chronic DVTs. Patient started on a heparin drip. No right heart strain on CT and  Transthoracic Echocardiogram significant for mildly reduced right ventricular systolic function and normal right ventricular size.  Acute on chronic respiratory failure with hypoxia Secondary to acute PE, acute heart failure, and right pleural effusion.  Hypoxia improved with treatment of heart failure but patient still significantly hypoxic with ambulation requiring up to 6 L/min of supplemental oxygen.  Repeat chest x-ray obtained on 3/26 significant for persistent right moderately sized pleural effusion. Patient underwent thoracentesis on 3/27. Still requiring significant increase in oxygen with  ambulation.  Acute on chronic combined heart failure Cardiology consulted.  Patient managed successfully with Lasix IV.  Echocardiogram obtained this admission significant for an LVEF of 25 to 30% with mild RV dysfunction.  Cardiology recommendations to transition to Lasix p.o. and have signed off. -Continue Lasix p.o.  Right empyema Persistent.  Pulmonology consult on admission with recommendations to defer thoracentesis.  Patient with continued hypoxia. Thoracentesis ordered and performed on 3/27 with improvement of dyspnea. Pleural fluid shows concern for infection with gram stain significant for gram positive cocci consistent with empyema. Patient has been on doxycycline for 7 days. -Follow-up pleural fluid labs -Vancomycin IV -Chest x-ray on 3/31 -Discussed with pulmonology with recommendations for repeat chest x-ray in 3 days and formal consult for chest tube if fluid has increased  Severe pulmonary hypertension Noted.  Patient is nonadherent with outpatient regimen of sildenafil.  Sildenafil resumed this admission. -Continue sildenafil  COPD Noted.  Patient treated for exacerbation with prednisone and breathing treatments.  Completed prednisone burst. -Continue albuterol as needed  Tobacco use Counseled on cessation.  Hyponatremia Mild.  Resolved.  Hypokalemia Resolved with potassium supplementation  Paroxysmal atrial fibrillation -Continue Eliquis and Coreg  Leukocytosis Unclear etiology. Neutrophil predominant. No associated infectious source.  Anemia of chronic disease Hemoglobin stable    DVT prophylaxis: Heparin IV Code Status:   Code Status: Do not attempt resuscitation (DNR) - Comfort care Family Communication: None at bedside Disposition Plan: Discharge in 3-5 days pending stable chest x-ray and transition to outpatient antibiotics for empyema   Consultants:  Cardiology Palliative care Pulmonary/Critical care medicine  Procedures:  Right-sided  thoracentesis  Antimicrobials: Doxycycline    Subjective: Breathing well. He has been ambulating well. No specific concerns today. Afebrile.  Objective: BP (!) 93/59 (BP Location: Left Arm)  Pulse 70   Temp 97.9 F (36.6 C) (Oral)   Resp 18   Ht 6\' 1"  (1.854 m)   Wt 63.5 kg   SpO2 94%   BMI 18.48 kg/m   Examination:  General exam: Appears calm and comfortable Respiratory system: Diminished without wheezing. Respiratory effort normal. Cardiovascular system: S1 & S2 heard, RRR. Gastrointestinal system: Abdomen is nondistended, soft and nontender. Normal bowel sounds heard. Central nervous system: Alert and oriented. No focal neurological deficits. Musculoskeletal: No edema. No calf tenderness Skin: Dry skin   Data Reviewed: I have personally reviewed following labs and imaging studies  CBC Lab Results  Component Value Date   WBC 11.6 (H) 06/02/2023   RBC 3.87 (L) 06/02/2023   HGB 11.7 (L) 06/02/2023   HCT 36.3 (L) 06/02/2023   MCV 93.8 06/02/2023   MCH 30.2 06/02/2023   PLT 361 06/02/2023   MCHC 32.2 06/02/2023   RDW 15.4 06/02/2023   LYMPHSABS 0.9 06/02/2023   MONOABS 0.7 06/02/2023   EOSABS 0.2 06/02/2023   BASOSABS 0.0 06/02/2023     Last metabolic panel Lab Results  Component Value Date   NA 136 06/02/2023   K 3.8 06/02/2023   CL 99 06/02/2023   CO2 26 06/02/2023   BUN 30 (H) 06/02/2023   CREATININE 0.90 06/02/2023   GLUCOSE 136 (H) 06/02/2023   GFRNONAA >60 06/02/2023   GFRAA 49 (L) 09/15/2019   CALCIUM 9.1 06/02/2023   PROT 6.7 05/31/2023   ALBUMIN 3.1 (L) 04/19/2023   LABGLOB 3.0 02/05/2023   BILITOT 1.2 04/19/2023   ALKPHOS 107 04/19/2023   AST 36 04/19/2023   ALT 25 04/19/2023   ANIONGAP 11 06/02/2023    GFR: Estimated Creatinine Clearance: 63.7 mL/min (by C-G formula based on SCr of 0.9 mg/dL).  Recent Results (from the past 240 hours)  Body fluid culture w Gram Stain     Status: Abnormal (Preliminary result)   Collection Time:  05/31/23  9:58 AM   Specimen: Lung, Right; Pleural Fluid  Result Value Ref Range Status   Specimen Description PLEURAL  Final   Special Requests right lung  Final   Gram Stain   Final    MODERATE WBC PRESENT, PREDOMINANTLY PMN RARE GRAM POSITIVE COCCI IN PAIRS    Culture (A)  Final    STREPTOCOCCUS PNEUMONIAE SUSCEPTIBILITIES TO FOLLOW Performed at [facility], [address] [place], [place] [address]    Report Status PENDING  Incomplete      Radiology Studies: No results found.     LOS: 9 days    Jacquelin Hawking, MD [facility] 06/02/2023, 1:02 PM   If 7PM-7AM, please contact night-coverage [email or web address]

## 2023-06-02 NOTE — Plan of Care (Signed)

## 2023-06-03 DIAGNOSIS — I272 Pulmonary hypertension, unspecified: Secondary | ICD-10-CM | POA: Diagnosis not present

## 2023-06-03 DIAGNOSIS — I2699 Other pulmonary embolism without acute cor pulmonale: Secondary | ICD-10-CM | POA: Diagnosis not present

## 2023-06-03 DIAGNOSIS — I5043 Acute on chronic combined systolic (congestive) and diastolic (congestive) heart failure: Secondary | ICD-10-CM | POA: Diagnosis not present

## 2023-06-03 DIAGNOSIS — J90 Pleural effusion, not elsewhere classified: Secondary | ICD-10-CM | POA: Diagnosis not present

## 2023-06-03 LAB — CBC WITH DIFFERENTIAL/PLATELET
Abs Immature Granulocytes: 0.08 10*3/uL — ABNORMAL HIGH (ref 0.00–0.07)
Basophils Absolute: 0 10*3/uL (ref 0.0–0.1)
Basophils Relative: 0 %
Eosinophils Absolute: 0.2 10*3/uL (ref 0.0–0.5)
Eosinophils Relative: 2 %
HCT: 39.5 % (ref 39.0–52.0)
Hemoglobin: 12.7 g/dL — ABNORMAL LOW (ref 13.0–17.0)
Immature Granulocytes: 1 %
Lymphocytes Relative: 8 %
Lymphs Abs: 0.9 10*3/uL (ref 0.7–4.0)
MCH: 30.2 pg (ref 26.0–34.0)
MCHC: 32.2 g/dL (ref 30.0–36.0)
MCV: 94 fL (ref 80.0–100.0)
Monocytes Absolute: 0.6 10*3/uL (ref 0.1–1.0)
Monocytes Relative: 6 %
Neutro Abs: 8.9 10*3/uL — ABNORMAL HIGH (ref 1.7–7.7)
Neutrophils Relative %: 83 %
Platelets: 357 10*3/uL (ref 150–400)
RBC: 4.2 MIL/uL — ABNORMAL LOW (ref 4.22–5.81)
RDW: 15.3 % (ref 11.5–15.5)
WBC: 10.6 10*3/uL — ABNORMAL HIGH (ref 4.0–10.5)
nRBC: 0 % (ref 0.0–0.2)

## 2023-06-03 LAB — BODY FLUID CULTURE W GRAM STAIN

## 2023-06-03 LAB — BASIC METABOLIC PANEL WITH GFR
Anion gap: 8 (ref 5–15)
BUN: 37 mg/dL — ABNORMAL HIGH (ref 8–23)
CO2: 31 mmol/L (ref 22–32)
Calcium: 9.2 mg/dL (ref 8.9–10.3)
Chloride: 96 mmol/L — ABNORMAL LOW (ref 98–111)
Creatinine, Ser: 0.95 mg/dL (ref 0.61–1.24)
GFR, Estimated: >60 mL/min (ref >60)
Glucose, Bld: 103 mg/dL — ABNORMAL HIGH (ref 70–99)
Potassium: 4 mmol/L (ref 3.5–5.1)
Sodium: 135 mmol/L (ref 135–145)

## 2023-06-03 MED ORDER — SODIUM CHLORIDE 0.9 % IV SOLN
2.0000 g | INTRAVENOUS | Status: DC
  Administered 2023-06-03 – 2023-06-05 (×3): 2 g via INTRAVENOUS
  Filled 2023-06-03 (×3): qty 20

## 2023-06-03 NOTE — Progress Notes (Signed)
 PROGRESS NOTE    Bruce [name]  ZOX:[id number] DOB: Mar 24, 1945 DOA: 05/24/2023 PCP: Miki Kins, FNP   Brief Narrative: Bruce [name] is a 78 y.o. male with a history of COPD, chronic respiratory failure on 3 L/min of oxygen, bilateral PE status post thrombolysis, CAD, paroxysmal atrial fibrillation, pulm hypertension, chronic systolic heart failure, medication nonadherence, tobacco use.  Patient presented secondary to worsening shortness of breath and chest discomfort and was found to have evidence of right mainstem PE in addition to acute on chronic heart failure.  Patient was started on heparin IV in addition to Lasix diuresis.  Cardiology was consulted for assistance in management.  Pulmonology was also consulted for assistance in management with recommendation to manage heart failure.  IV diuresis completed.  Patient still with significant hypoxia with ambulation. Pleural fluid labs consistent with empyema.   Assessment and Plan:  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sm Large pulmonary artery embolus involving the right main pulmonary artery with extension into the right middle and right lower lobe branches with resolution of previously seen left lower lobe pulmonary artery emboli. Secondary to nonadherence with anticoagulation. Associated BLE chronic DVTs. Patient started on a heparin drip. No right heart strain on CT and  Transthoracic Echocardiogram significant for mildly reduced right ventricular systolic function and normal right ventricular size.  Acute on chronic respiratory failure with hypoxia Secondary to acute PE, acute heart failure, and right pleural effusion.  Hypoxia improved with treatment of heart failure but patient still significantly hypoxic with ambulation requiring up to 6 L/min of supplemental oxygen.  Repeat chest x-ray obtained on 3/26 significant for persistent right moderately sized pleural effusion. Patient underwent thoracentesis on 3/27. Still requiring  significant increase in oxygen with ambulation.  Acute on chronic combined heart failure Cardiology consulted.  Patient managed successfully with Lasix IV.  Echocardiogram obtained this admission significant for an LVEF of 25 to 30% with mild RV dysfunction.  Cardiology recommendations to transition to Lasix p.o. and have signed off. -Continue Lasix p.o.  Right empyema Persistent.  Pulmonology consult on admission with recommendations to defer thoracentesis.  Patient with continued hypoxia. Thoracentesis ordered and performed on 3/27 with improvement of dyspnea. Pleural fluid shows concern for infection with gram stain significant for gram positive cocci consistent with empyema. Patient has been on doxycycline for 7 days. Pleural fluid significant for streptococcus pneumoniae. -Follow-up pleural fluid labs -Discontinue vancomycin IV and start Ceftriaxone -Chest x-ray on 3/31 -Discussed with pulmonology with recommendations for repeat chest x-ray and formal consult for chest tube if fluid has increased  Severe pulmonary hypertension Noted.  Patient is nonadherent with outpatient regimen of sildenafil.  Sildenafil resumed this admission. -Continue sildenafil  COPD Noted.  Patient treated for exacerbation with prednisone and breathing treatments.  Completed prednisone burst. -Continue albuterol as needed  Tobacco use Counseled on cessation.  Hyponatremia Mild.  Resolved.  Hypokalemia Resolved with potassium supplementation  Paroxysmal atrial fibrillation -Continue Eliquis and Coreg  Leukocytosis Unclear etiology. Neutrophil predominant. No associated infectious source.  Anemia of chronic disease Hemoglobin stable    DVT prophylaxis: Heparin IV Code Status:   Code Status: Do not attempt resuscitation (DNR) - Comfort care Family Communication: None at bedside Disposition Plan: Discharge in 2-4 days pending stable chest x-ray and transition to outpatient antibiotics for  empyema   Consultants:  Cardiology Palliative care Pulmonary/Critical care medicine  Procedures:  Right-sided thoracentesis  Antimicrobials: Doxycycline    Subjective: Continues to breathe well. No concerns this morning.  Objective:  BP 97/65 (BP Location: Left Arm)   Pulse 75   Temp 98 F (36.7 C)   Resp 18   Ht 6\' 1"  (1.854 m)   Wt 63 kg   SpO2 92%   BMI 18.32 kg/m   Examination:  General exam: Appears calm and comfortable. Frail appearing. Respiratory system: Diminished. Respiratory effort normal. Cardiovascular system: S1 & S2 heard, RRR. No murmurs, rubs, gallops or clicks. Gastrointestinal system: Abdomen is nondistended, soft and nontender. Normal bowel sounds heard. Central nervous system: Alert and oriented. No focal neurological deficits. Musculoskeletal: No edema. No calf tenderness Psychiatry: Judgement and insight appear normal. Mood & affect appropriate.    Data Reviewed: I have personally reviewed following labs and imaging studies  CBC Lab Results  Component Value Date   WBC 10.6 (H) 06/03/2023   RBC 4.20 (L) 06/03/2023   HGB 12.7 (L) 06/03/2023   HCT 39.5 06/03/2023   MCV 94.0 06/03/2023   MCH 30.2 06/03/2023   PLT 357 06/03/2023   MCHC 32.2 06/03/2023   RDW 15.3 06/03/2023   LYMPHSABS 0.9 06/03/2023   MONOABS 0.6 06/03/2023   EOSABS 0.2 06/03/2023   BASOSABS 0.0 06/03/2023     Last metabolic panel Lab Results  Component Value Date   NA 135 06/03/2023   K 4.0 06/03/2023   CL 96 (L) 06/03/2023   CO2 31 06/03/2023   BUN 37 (H) 06/03/2023   CREATININE 0.95 06/03/2023   GLUCOSE 103 (H) 06/03/2023   GFRNONAA >60 06/03/2023   GFRAA 49 (L) 09/15/2019   CALCIUM 9.2 06/03/2023   PROT 6.7 05/31/2023   ALBUMIN 3.1 (L) 04/19/2023   LABGLOB 3.0 02/05/2023   BILITOT 1.2 04/19/2023   ALKPHOS 107 04/19/2023   AST 36 04/19/2023   ALT 25 04/19/2023   ANIONGAP 8 06/03/2023    GFR: Estimated Creatinine Clearance: 59.9 mL/min (by C-G  formula based on SCr of 0.95 mg/dL).  Recent Results (from the past 240 hours)  Body fluid culture w Gram Stain     Status: Abnormal (Preliminary result)   Collection Time: 05/31/23  9:58 AM   Specimen: Lung, Right; Pleural Fluid  Result Value Ref Range Status   Specimen Description PLEURAL  Final   Special Requests right lung  Final   Gram Stain   Final    MODERATE WBC PRESENT, PREDOMINANTLY PMN RARE GRAM POSITIVE COCCI IN PAIRS    Culture (A)  Final    STREPTOCOCCUS PNEUMONIAE SUSCEPTIBILITIES TO FOLLOW Performed at [facility], [address] [place], [place] [address]    Report Status PENDING  Incomplete      Radiology Studies: No results found.     LOS: 10 days    Jacquelin Hawking, MD [facility] 06/03/2023, 9:01 AM   If 7PM-7AM, please contact night-coverage [email or web address]

## 2023-06-03 NOTE — Plan of Care (Signed)
  Problem: Education: Goal: Knowledge of General Education information will improve Description: Including pain rating scale, medication(s)/side effects and non-pharmacologic comfort measures Outcome: Progressing   Problem: Coping: Goal: Level of anxiety will decrease Outcome: Progressing   Problem: Elimination: Goal: Will not experience complications related to urinary retention Outcome: Progressing   Problem: Pain Managment: Goal: General experience of comfort will improve and/or be controlled Outcome: Progressing

## 2023-06-03 NOTE — Plan of Care (Signed)

## 2023-06-04 ENCOUNTER — Inpatient Hospital Stay (HOSPITAL_COMMUNITY)

## 2023-06-04 DIAGNOSIS — I5043 Acute on chronic combined systolic (congestive) and diastolic (congestive) heart failure: Secondary | ICD-10-CM | POA: Diagnosis not present

## 2023-06-04 DIAGNOSIS — I272 Pulmonary hypertension, unspecified: Secondary | ICD-10-CM | POA: Diagnosis not present

## 2023-06-04 DIAGNOSIS — I2699 Other pulmonary embolism without acute cor pulmonale: Secondary | ICD-10-CM | POA: Diagnosis not present

## 2023-06-04 DIAGNOSIS — J90 Pleural effusion, not elsewhere classified: Secondary | ICD-10-CM | POA: Diagnosis not present

## 2023-06-04 LAB — CBC WITH DIFFERENTIAL/PLATELET
Abs Immature Granulocytes: 0.04 10*3/uL (ref 0.00–0.07)
Basophils Absolute: 0 10*3/uL (ref 0.0–0.1)
Basophils Relative: 0 %
Eosinophils Absolute: 0.2 10*3/uL (ref 0.0–0.5)
Eosinophils Relative: 2 %
HCT: 37.4 % — ABNORMAL LOW (ref 39.0–52.0)
Hemoglobin: 12.2 g/dL — ABNORMAL LOW (ref 13.0–17.0)
Immature Granulocytes: 0 %
Lymphocytes Relative: 9 %
Lymphs Abs: 0.9 10*3/uL (ref 0.7–4.0)
MCH: 30.2 pg (ref 26.0–34.0)
MCHC: 32.6 g/dL (ref 30.0–36.0)
MCV: 92.6 fL (ref 80.0–100.0)
Monocytes Absolute: 0.6 10*3/uL (ref 0.1–1.0)
Monocytes Relative: 6 %
Neutro Abs: 7.8 10*3/uL — ABNORMAL HIGH (ref 1.7–7.7)
Neutrophils Relative %: 83 %
Platelets: 358 10*3/uL (ref 150–400)
RBC: 4.04 MIL/uL — ABNORMAL LOW (ref 4.22–5.81)
RDW: 15.5 % (ref 11.5–15.5)
WBC: 9.5 10*3/uL (ref 4.0–10.5)
nRBC: 0 % (ref 0.0–0.2)

## 2023-06-04 LAB — BASIC METABOLIC PANEL WITH GFR
Anion gap: 7 (ref 5–15)
BUN: 35 mg/dL — ABNORMAL HIGH (ref 8–23)
CO2: 27 mmol/L (ref 22–32)
Calcium: 8.9 mg/dL (ref 8.9–10.3)
Chloride: 99 mmol/L (ref 98–111)
Creatinine, Ser: 0.93 mg/dL (ref 0.61–1.24)
GFR, Estimated: >60 mL/min (ref >60)
Glucose, Bld: 144 mg/dL — ABNORMAL HIGH (ref 70–99)
Potassium: 4.3 mmol/L (ref 3.5–5.1)
Sodium: 133 mmol/L — ABNORMAL LOW (ref 135–145)

## 2023-06-04 MED ORDER — FUROSEMIDE 40 MG PO TABS
40.0000 mg | ORAL_TABLET | ORAL | Status: DC
  Administered 2023-06-05: 40 mg via ORAL
  Filled 2023-06-04: qty 1

## 2023-06-04 NOTE — Plan of Care (Signed)
   Medical records reviewed. Patient remains hospitalized due to empyema.   Goals of care are to improve and find a new living environment. He has declined outpatient palliative care follow up and states he will know when it is time to involve hospice.   PMT will continue to follow peripherally. I am back on service Wednesday 4/2 if needs arise and patient remains admitted.  Thank you for your referral and allowing PMT to assist in Mr. Bruce [name]'s care.   Richardson Dopp, [facility] Palliative Medicine Team  Team Phone # [phone]   NO CHARGE

## 2023-06-04 NOTE — TOC Progression Note (Signed)
 Transition of Care [facility]) - Progression Note    Patient Details  Name: Bruce [name] MRN: [id number] Date of Birth: August 14, 1945  Transition of Care [facility]) CM/SW Contact  Kermit Balo, RN Phone Number: 06/04/2023, 10:01 AM  Clinical Narrative:     Plan: Discharge in 1-3 days pending stable chest x-ray and transition to outpatient antibiotics for empyema. Pt has refused HH services.  TOC following.   Expected Discharge Plan: Home w Home Health Services Barriers to Discharge: Continued Medical Work up  Expected Discharge Plan and Services In-house Referral: NA Discharge Planning Services: CM Consult Post Acute Care Choice: NA Living arrangements for the past 2 months: Mobile Home                 DME Arranged: N/A DME Agency: NA       HH Arranged: PT, Patient Refused HH           Social Determinants of Health (SDOH) Interventions SDOH Screenings   Food Insecurity: No Food Insecurity (05/25/2023)  Recent Concern: Food Insecurity - Food Insecurity Present (05/24/2023)  Housing: Low Risk  (05/25/2023)  Recent Concern: Housing - High Risk (04/19/2023)  Transportation Needs: Unmet Transportation Needs (05/25/2023)  Utilities: Not At Risk (05/25/2023)  Financial Resource Strain: High Risk (04/12/2022)  Physical Activity: Inactive (02/05/2017)  Social Connections: Unknown (05/25/2023)  Stress: Stress Concern Present (02/05/2017)  Tobacco Use: Medium Risk (05/24/2023)    Readmission Risk Interventions    05/28/2023    5:06 PM  Readmission Risk Prevention Plan  Transportation Screening Complete  PCP or Specialist Appt within 5-7 Days Complete  Home Care Screening Complete  Medication Review (RN CM) Complete

## 2023-06-04 NOTE — Progress Notes (Signed)
 PROGRESS NOTE    Bruce [name]  ONG:[id number] DOB: [birth year] DOA: 05/24/2023 PCP: Miki Kins, FNP   Brief Narrative: Bruce [name] is a 78 y.o. male with a history of COPD, chronic respiratory failure on 3 L/min of oxygen, bilateral PE status post thrombolysis, CAD, paroxysmal atrial fibrillation, pulm hypertension, chronic systolic heart failure, medication nonadherence, tobacco use.  Patient presented secondary to worsening shortness of breath and chest discomfort and was found to have evidence of right mainstem PE in addition to acute on chronic heart failure.  Patient was started on heparin IV in addition to Lasix diuresis.  Cardiology was consulted for assistance in management.  Pulmonology was also consulted for assistance in management with recommendation to manage heart failure.  IV diuresis completed.  Patient still with significant hypoxia with ambulation. Pleural fluid labs consistent with empyema and culture significant for streptococcus pneumoniae.   Assessment and Plan:  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sm Large pulmonary artery embolus involving the right main pulmonary artery with extension into the right middle and right lower lobe branches with resolution of previously seen left lower lobe pulmonary artery emboli. Secondary to nonadherence with anticoagulation. Associated BLE chronic DVTs. Patient started on a heparin drip. No right heart strain on CT and  Transthoracic Echocardiogram significant for mildly reduced right ventricular systolic function and normal right ventricular size.  Acute on chronic respiratory failure with hypoxia Secondary to acute PE, acute heart failure, and right pleural effusion.  Hypoxia improved with treatment of heart failure but patient still significantly hypoxic with ambulation requiring up to 6 L/min of supplemental oxygen.  Repeat chest x-ray obtained on 3/26 significant for persistent right moderately sized pleural effusion. Patient  underwent thoracentesis on 3/27. Still requiring significant increase in oxygen with ambulation.  Acute on chronic combined heart failure Cardiology consulted.  Patient managed successfully with Lasix IV.  Echocardiogram obtained this admission significant for an LVEF of 25 to 30% with mild RV dysfunction.  Cardiology recommendations to transition to Lasix p.o. and have signed off. -Hold Lasix PO today and switch to every other day dosing  Right empyema Persistent.  Pulmonology consult on admission with recommendations to defer thoracentesis.  Patient with continued hypoxia. Thoracentesis ordered and performed on 3/27 with improvement of dyspnea. Pleural fluid shows concern for infection with gram stain significant for gram positive cocci consistent with empyema. Patient has been on doxycycline for 7 days. Pleural fluid significant for streptococcus pneumoniae. Repeat chest x-ray (3/31) with stable loculated fluid); pulmonology not called since no increase in fluid noted. -Continue Ceftriaxone -Infectious disease consult  Severe pulmonary hypertension Noted.  Patient is nonadherent with outpatient regimen of sildenafil.  Sildenafil resumed this admission. -Continue sildenafil  COPD Noted.  Patient treated for exacerbation with prednisone and breathing treatments.  Completed prednisone burst. -Continue albuterol as needed  Tobacco use Counseled on cessation.  Hyponatremia Mild.  Resolved.  Hypokalemia Resolved with potassium supplementation  Paroxysmal atrial fibrillation -Continue Eliquis and Coreg  Leukocytosis Unclear etiology. Neutrophil predominant. No associated infectious source.  Anemia of chronic disease Hemoglobin stable   DVT prophylaxis: Heparin IV Code Status:   Code Status: Do not attempt resuscitation (DNR) - Comfort care Family Communication: None at bedside Disposition Plan: Discharge in 1-2 days pending infectious disease recommendations and transition to  outpatient antibiotics for empyema   Consultants:  Cardiology Palliative care Pulmonary/Critical care medicine Infectious disease  Procedures:  Right-sided thoracentesis  Antimicrobials: Doxycycline Vancomycin Ceftriaxone   Subjective: No concerns today. No issues  from overnight.  Objective: BP 107/69 (BP Location: Right Arm)   Pulse 80   Temp (!) 97.5 F (36.4 C) (Oral)   Resp (!) 22   Ht 6\' 1"  (1.854 m)   Wt 63 kg   SpO2 90%   BMI 18.32 kg/m   Examination:  General exam: Appears calm and comfortable. Very thing appearing. Respiratory system: Clear to auscultation. Respiratory effort normal. Cardiovascular system: S1 & S2 heard, RRR. Gastrointestinal system: Abdomen is nondistended, soft and nontender. Normal bowel sounds heard. Central nervous system: Alert and oriented. No focal neurological deficits. Musculoskeletal: No edema. No calf tenderness Psychiatry: Judgement and insight appear normal. Mood & affect appropriate.    Data Reviewed: I have personally reviewed following labs and imaging studies  CBC Lab Results  Component Value Date   WBC 10.6 (H) 06/03/2023   RBC 4.20 (L) 06/03/2023   HGB 12.7 (L) 06/03/2023   HCT 39.5 06/03/2023   MCV 94.0 06/03/2023   MCH 30.2 06/03/2023   PLT 357 06/03/2023   MCHC 32.2 06/03/2023   RDW 15.3 06/03/2023   LYMPHSABS 0.9 06/03/2023   MONOABS 0.6 06/03/2023   EOSABS 0.2 06/03/2023   BASOSABS 0.0 06/03/2023     Last metabolic panel Lab Results  Component Value Date   NA 135 06/03/2023   K 4.0 06/03/2023   CL 96 (L) 06/03/2023   CO2 31 06/03/2023   BUN 37 (H) 06/03/2023   CREATININE 0.95 06/03/2023   GLUCOSE 103 (H) 06/03/2023   GFRNONAA >60 06/03/2023   GFRAA 49 (L) 09/15/2019   CALCIUM 9.2 06/03/2023   PROT 6.7 05/31/2023   ALBUMIN 3.1 (L) 04/19/2023   LABGLOB 3.0 02/05/2023   BILITOT 1.2 04/19/2023   ALKPHOS 107 04/19/2023   AST 36 04/19/2023   ALT 25 04/19/2023   ANIONGAP 8 06/03/2023     GFR: Estimated Creatinine Clearance: 59.9 mL/min (by C-G formula based on SCr of 0.95 mg/dL).  Recent Results (from the past 240 hours)  Body fluid culture w Gram Stain     Status: Abnormal   Collection Time: 05/31/23  9:58 AM   Specimen: Lung, Right; Pleural Fluid  Result Value Ref Range Status   Specimen Description PLEURAL  Final   Special Requests right lung  Final   Gram Stain   Final    MODERATE WBC PRESENT, PREDOMINANTLY PMN RARE GRAM POSITIVE COCCI IN PAIRS Performed at [facility], [address], [place], [place] [address]    Culture STREPTOCOCCUS PNEUMONIAE (A)  Final   Report Status 06/03/2023 FINAL  Final   Organism ID, Bacteria STREPTOCOCCUS PNEUMONIAE  Final      Susceptibility   Streptococcus pneumoniae - MIC*    ERYTHROMYCIN <=0.12 SENSITIVE Sensitive     LEVOFLOXACIN 1 SENSITIVE Sensitive     VANCOMYCIN 0.5 SENSITIVE Sensitive     PENO - penicillin <=0.06      PENICILLIN (non-meningitis) <=0.06 SENSITIVE Sensitive     PENICILLIN (oral) <=0.06 SENSITIVE Sensitive     CEFTRIAXONE (non-meningitis) <=0.12 SENSITIVE Sensitive     * STREPTOCOCCUS PNEUMONIAE      Radiology Studies: No results found.     LOS: 11 days    Jacquelin Hawking, MD [facility] 06/04/2023, 8:30 AM   If 7PM-7AM, please contact night-coverage [email or web address]

## 2023-06-04 NOTE — Progress Notes (Signed)
 Mobility Specialist Progress Note:   06/04/23 1040  Mobility  Activity Refused mobility   Pt refused OOB mobility d/t fatigue from ambulating to bathroom. Pt declining bed exercises as well. Will f/u as able.    Leory Plowman  Mobility Specialist Please contact via [employer] office at [phone]

## 2023-06-04 NOTE — Progress Notes (Signed)
 Occupational Therapy Treatment Patient Details Name: Bruce [name] MRN: [id number] DOB: 1945-06-03 Today's Date: 06/04/2023   History of present illness 78 yo male presents to [facility] with SOB and chest pain. , CTA chest showed right mainstem PE. Recent admission 2/25 with acute on chronic respiratory failure and volume overload, flu A. PMH: prostate cancer, CHF, COPD, coronary artery disease, GERD, HTN, MI, PVD, HLD, PAD, Hx of DVT & PE, NSTEMI, Afib, pulmonary hypertension, medical noncompliance   OT comments  Pt continues to decline all OOB activities despite education on importance of mobilization. Pt agitated during session, and only agreeable to bed level grooming tasks, declining all dressing and other ADLs despite max encouragement. Continue to recommend HHOT to optimize independence levels. Will continue to follow acutely.       If plan is discharge home, recommend the following:  Assistance with cooking/housework   Equipment Recommendations  None recommended by OT       Precautions / Restrictions Precautions Precautions: Fall Recall of Precautions/Restrictions: Intact Precaution/Restrictions Comments: monitor O2 (wears 3-4 L O2 at baseline) Restrictions Weight Bearing Restrictions Per Provider Order: No       Mobility Bed Mobility Overal bed mobility: Modified Independent             General bed mobility comments: Pt declining all mobility    Transfers Overall transfer level: Needs assistance                 General transfer comment: Pt declining all OOB activities     Balance Overall balance assessment: Needs assistance, History of Falls           ADL either performed or assessed with clinical judgement   ADL Overall ADL's : Needs assistance/impaired     Grooming: Wash/dry face;Oral care;Set up;Bed level           General ADL Comments: Pt declining all OOB activities    Extremity/Trunk Assessment Upper Extremity Assessment Upper  Extremity Assessment: Overall WFL for tasks assessed   Lower Extremity Assessment Lower Extremity Assessment: Defer to PT evaluation        Vision   Vision Assessment?: No apparent visual deficits   Perception     Praxis     Communication Communication Communication: No apparent difficulties Factors Affecting Communication: Hearing impaired   Cognition Arousal: Alert Behavior During Therapy: Agitated Cognition: No apparent impairments         Following commands: Intact        Cueing   Cueing Techniques: Verbal cues        General Comments VSS on 2L O2    Pertinent Vitals/ Pain       Pain Assessment Pain Assessment: No/denies pain   Frequency  Min 1X/week        Progress Toward Goals  OT Goals(current goals can now be found in the care plan section)  Progress towards OT goals: Not progressing toward goals - comment (Pt declining all OOB activities limiting progression towards goals)  Acute Rehab OT Goals Patient Stated Goal: Get out of the hospital OT Goal Formulation: With patient Time For Goal Achievement: 06/11/23 Potential to Achieve Goals: Good ADL Goals Pt Will Perform Grooming: with modified independence;standing Pt Will Perform Lower Body Bathing: with contact guard assist;sit to/from stand Pt Will Transfer to Toilet: with modified independence;ambulating Pt/caregiver will Perform Home Exercise Program: Increased strength;Both right and left upper extremity;With theraband;Independently;With written HEP provided Additional ADL Goal #1: Pt to verbalize at least 3 energy conservation strategies to  implement at home   AM-PAC OT "6 Clicks" Daily Activity     Outcome Measure   Help from another person eating meals?: None Help from another person taking care of personal grooming?: A Little Help from another person toileting, which includes using toliet, bedpan, or urinal?: A Little Help from another person bathing (including washing, rinsing,  drying)?: A Little Help from another person to put on and taking off regular upper body clothing?: A Little Help from another person to put on and taking off regular lower body clothing?: A Little 6 Click Score: 19    End of Session Equipment Utilized During Treatment: Oxygen (2L)  OT Visit Diagnosis: Muscle weakness (generalized) (M62.81);Other abnormalities of gait and mobility (R26.89)   Activity Tolerance Treatment limited secondary to agitation   Patient Left in bed;with call bell/phone within reach   Nurse Communication Mobility status        Time: 1601-0932 OT Time Calculation (min): 21 min  Charges: OT General Charges $OT Visit: 1 Visit OT Treatments $Self Care/Home Management : 8-22 mins  Ivor Messier, OT  Acute Rehabilitation Services Office [phone] Secure chat preferred   [name] 06/04/2023, 9:14 AM

## 2023-06-04 NOTE — Consult Note (Addendum)
 I have seen and examined the patient. I have personally reviewed the clinical findings, laboratory findings, microbiological data and imaging studies. The assessment and treatment plan was discussed with the Nurse Practitioner. I agree with her/his recommendations except following additions/corrections.  78 year old male with prior history of colon cancer, prostate cancer, lung ca, CHF, chronic respiratory failure /COPD on 3 L oxygen, bilateral PE status post thrombolytics, DVTs, CAD, PVD, paroxysmal A-fib, pulmonary hypertension, tobacco abuse who presented to the ED for shortness of breath and chest pain.  Workup in the ED remarkable for right mainstem PE on CTA as well as acute on chronic CHF with right pleural effusion.  At ED, WBC elevated to 15.6, BNP approx 2400, troponins 721.  Started on IV diuretics as well as anticoagulation.   Repeat chest x-ray on 3/26 due to hypoxia requiring 6 L/min oxygen, s/p thoracentesis on 3/27. Cx with strep pneumoniae.   Exam elderly male on supplemental oxygen. Awake, alert and oriented, non focal exam, RRR, decreased air entry in the rt, bases. Abdomen soft and non distended, no pedal edema/septic joints or rashes. Rt lateral ankle covered with bandage( uncooperative for exam)  Continue ceftriaxone IP Fu with Pulm regarding ? Chest tube Monitor CBC and CMP Will need at least 4 weeks of abtx on discharge Universal/standard isolation precautions   I have personally spent 81 minutes involved in face-to-face and non-face-to-face activities for this patient on the day of the visit. Professional time spent includes the following activities: Preparing to see the patient (review of tests), Obtaining and/or reviewing separately obtained history (admission/discharge record), Performing a medically appropriate examination and/or evaluation , Ordering medications/tests/procedures, referring and communicating with other health care professionals, Documenting clinical  information in the EMR, Independently interpreting results (not separately reported), Communicating results to the patient/family/caregiver, Counseling and educating the patient/family/caregiver and Care coordination (not separately reported).     [facility]    Date of Admission:  05/24/2023     Total days of antibiotics 11               Reason for Consult: Empyema   Referring Provider: Dr. Caleb Popp Primary Care Provider: Miki Kins, FNP   ASSESSMENT:  Mr. Bruce [name] is a 78 year old Caucasian male with COPD, heart failure, and pulmonary embolism presenting to the hospital with shortness of breath and chest pain and found to have acute on chronic pulmonary emboli with course complicated by a loculated pleural effusion status post thoracentesis with cultures growing Streptococcus pneumonia.  Loculated effusion appears grossly stable with most recent chest x-ray.  Discussed recommended plan of care to continue with current dose of ceftriaxone.  Would benefit from pneumococcal vaccination at some point which is currently declined.  Continue pulmonary embolism treatment with Eliquis.  Wound care right ankle per wound RN.  Continue standard/universal precautions.  Remaining medical and supportive care per Internal Medicine.    PLAN:  Continue current dose of ceftriaxone. Pulmonary emboli treatment with Eliquis. Monitor for improvements in loculated pleural effusion and if shortness of breath continues may need to discuss with pulmonology. Wound care per wound RN. Remaining medical and supportive care per internal medicine.   Principal Problem:   Pulmonary embolism on right [facility]) Active Problems:   Pulmonary hypertension, unspecified (HCC)   Acute on chronic combined systolic and diastolic CHF (congestive heart failure) (HCC)   Pleural effusion, right   CTEPH (chronic thromboembolic pulmonary hypertension) (HCC)   NICM (nonischemic cardiomyopathy) (HCC)    Pulmonary arterial hypertension (  HCC)   Noncompliance   Biventricular heart failure (HCC)    apixaban  10 mg Oral BID   Followed by   Melene Muller ON 06/05/2023] apixaban  5 mg Oral BID   budesonide (PULMICORT) nebulizer solution  0.5 mg Nebulization BID   carvedilol  3.125 mg Oral BID WC   dapagliflozin propanediol  10 mg Oral Daily   feeding supplement  237 mL Oral BID BM   [START ON 06/05/2023] furosemide  40 mg Oral QODAY   hydrocerin   Topical BID   lidocaine  20 mL Intradermal Once   sildenafil  20 mg Oral TID     HPI: Bruce [name] is a 78 y.o. male with previous medical history of COPD on 3 L nasal cannula at baseline, paroxysmal atrial fibrillation, bilateral pulmonary emboli in February 2024 status post thrombolytics, lung cancer, prostate cancer and colon cancer presenting to the emergency room following cardiology appointment with chest discomfort and hypoxia.  Bruce [name] presented to the hospital chest pain and shortness of breath along with generalized weakness.  Afebrile on admission with white blood cell count of 12,000.  Initial chest x-ray on 05/24/2023 with small loculated right pleural effusion associated with right basilar atelectasis or inflammation.  CT angio chest with large pulmonary artery emboli of the right main pulmonary artery and moderate right pleural effusion with partial compressive atelectasis of the right lung.  Found to have acute on chronic pulmonary emboli secondary to noncompliance with anticoagulation.  Doxycycline was added for COPD exacerbation along with prednisone.  On 05/30/2023 noted to have persistent right pleural effusion with IR consulted for thoracentesis.  Pleural fluid found to have 33,750 total nucleated cells and greater than 2500 LDH.  Pleural fluid showing gram-positive cocci on Gram stain and Streptococcus pneumoniae on culture.  Completed approximately 8-day course of doxycycline and was transitioned to ceftriaxone.  Chest x-ray from 05/31/2023  with grossly stable loculated right pleural effusion.  Bruce [name] continues to have shortness of breath status post thoracentesis with no significant improvement in symptoms.  Also has a right lateral malleolus wound evaluated by wound RN with venous and vascular insufficiency.  There was minimal serosanguineous drainage and no odor present with wound care recommendations provided.  Frustration with coming to the hospital and now having "pneumonia".  He has not received any pneumococcal vaccinations in the past.  Review of Systems: Review of Systems  Constitutional:  Negative for chills, fever and weight loss.  Respiratory:  Positive for shortness of breath. Negative for cough and wheezing.   Cardiovascular:  Negative for chest pain and leg swelling.  Gastrointestinal:  Negative for abdominal pain, constipation, diarrhea, nausea and vomiting.  Skin:  Negative for rash.     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 01/01/2015   Complication of anesthesia    wake up with a head ache   COPD (chronic obstructive pulmonary disease) (HCC)    Coronary artery disease    Edema 08/16/2010   GERD (gastroesophageal reflux disease)  Headache    related to sinus congestion   History of blood transfusion    History of kidney stones    History of MI (myocardial infarction) 10/08/2013   Hypertension    Hypokalemia 04/09/2022   Intracranial bleed (HCC) 12/31/2018   Lactic acidosis 04/09/2022   Leg cramps 08/16/2010   Malignant neoplasm of prostate (HCC) 07/13/2016   Multiple closed  facial bone fractures (HCC)    Myocardial infarction [facility])    '09 AND '12   Peripheral vascular disease (HCC)    Shortness of breath    With exertion .   Tibia/fibula fracture 10/06/2013   Tibial plateau fracture 12/29/2014   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HCC) 10/08/2013   UTI (urinary tract infection)    frequent UTI   Visual hallucination 03/18/2015    Social History   Tobacco Use   Smoking status: Former    Current packs/day: 0.00    Average packs/day: 0.5 packs/day for 52.0 years (26.0 ttl pk-yrs)    Types: Cigarettes    Start date: 07/05/1970    Quit date: 07/05/2022    Years since quitting: 0.9   Smokeless tobacco: Never  Vaping Use   Vaping status: Never Used  Substance Use Topics   Alcohol use: No    Alcohol/week: 0.0 standard drinks of alcohol    Comment: Stopped 2009   Drug use: No    Family History  Problem Relation Age of Onset   Hypertension Mother    Prostate cancer Brother    Mental illness Neg Hx     Allergies  Allergen Reactions   Adhesive [Tape] Other (See Comments)    After right leg fracture surgery, pt developed a large blister where tape was applied to his right leg. OK to use paper tape.   Eggs-Apples-Oats [Alitraq] Other (See Comments)    Caused chest pains   Imdur [Isosorbide Dinitrate] Other (See Comments)    hallucinations   Singulair [Montelukast Sodium] Other (See Comments)    Hallucinations     OBJECTIVE: Blood pressure 107/69, pulse 80, temperature (!) 97.5 F (36.4 C), temperature source Oral, resp. rate (!) 22, height 6\' 1"  (1.854 m), weight 63 kg, SpO2 90%.  Physical Exam Constitutional:      General: He is not in acute distress.    Appearance: He is well-developed. He is ill-appearing.     Interventions: Nasal cannula in place.  Cardiovascular:     Rate and Rhythm: Normal rate and regular rhythm.     Heart sounds: Normal heart sounds.  Pulmonary:     Effort: Pulmonary effort is normal. Tachypnea present.     Breath  sounds: Normal breath sounds.  Skin:    General: Skin is warm and dry.  Neurological:     Mental Status: He is oriented to person, place, and time.  Psychiatric:        Behavior: Behavior normal.        Thought Content: Thought content normal.        Judgment: Judgment normal.     Lab Results Lab Results  Component Value Date   WBC 9.5 06/04/2023   HGB 12.2 (L) 06/04/2023   HCT 37.4 (L) 06/04/2023   MCV 92.6 06/04/2023   PLT 358 06/04/2023    Lab Results  Component Value Date   CREATININE 0.93 06/04/2023   BUN 35 (H) 06/04/2023   NA 133 (L) 06/04/2023   K 4.3 06/04/2023   CL 99 06/04/2023   CO2 27 06/04/2023    Lab  Results  Component Value Date   ALT 25 04/19/2023   AST 36 04/19/2023   ALKPHOS 107 04/19/2023   BILITOT 1.2 04/19/2023     Microbiology: Recent Results (from the past 240 hours)  Body fluid culture w Gram Stain     Status: Abnormal   Collection Time: 05/31/23  9:58 AM   Specimen: Lung, Right; Pleural Fluid  Result Value Ref Range Status   Specimen Description PLEURAL  Final   Special Requests right lung  Final   Gram Stain   Final    MODERATE WBC PRESENT, PREDOMINANTLY PMN RARE GRAM POSITIVE COCCI IN PAIRS Performed at [facility], [address] [place], [place] [address]    Culture STREPTOCOCCUS PNEUMONIAE (A)  Final   Report Status 06/03/2023 FINAL  Final   Organism ID, Bacteria STREPTOCOCCUS PNEUMONIAE  Final      Susceptibility   Streptococcus pneumoniae - MIC*    ERYTHROMYCIN <=0.12 SENSITIVE Sensitive     LEVOFLOXACIN 1 SENSITIVE Sensitive     VANCOMYCIN 0.5 SENSITIVE Sensitive     PENO - penicillin <=0.06      PENICILLIN (non-meningitis) <=0.06 SENSITIVE Sensitive     PENICILLIN (oral) <=0.06 SENSITIVE Sensitive     CEFTRIAXONE (non-meningitis) <=0.12 SENSITIVE Sensitive     * STREPTOCOCCUS PNEUMONIAE     I have personally spent  30 minutes involved in face-to-face and non-face-to-face activities for this patient on the  day of the visit. Professional time spent includes the following activities: Preparing to see the patient (review of tests), Obtaining and/or reviewing separately obtained history (admission/discharge record), Performing a medically appropriate examination and/or evaluation , Ordering medications/tests/procedures, referring and communicating with other health care professionals, Documenting clinical information in the EMR, Independently interpreting results (not separately reported), Communicating results to the patient/family/caregiver, Counseling and educating the patient/family/caregiver and Care coordination (not separately reported).   Marcos Eke, NP [facility]  06/04/2023  10:56 AM

## 2023-06-04 NOTE — Progress Notes (Signed)
 Pt has refused for this nurse to check his right foot due to him been in pain. Patient has been educated and was unable to get walking stats on patient.

## 2023-06-05 DIAGNOSIS — J90 Pleural effusion, not elsewhere classified: Secondary | ICD-10-CM | POA: Diagnosis not present

## 2023-06-05 DIAGNOSIS — I272 Pulmonary hypertension, unspecified: Secondary | ICD-10-CM | POA: Diagnosis not present

## 2023-06-05 DIAGNOSIS — I5043 Acute on chronic combined systolic (congestive) and diastolic (congestive) heart failure: Secondary | ICD-10-CM | POA: Diagnosis not present

## 2023-06-05 DIAGNOSIS — I2699 Other pulmonary embolism without acute cor pulmonale: Secondary | ICD-10-CM | POA: Diagnosis not present

## 2023-06-05 LAB — CBC WITH DIFFERENTIAL/PLATELET
Abs Immature Granulocytes: 0.05 10*3/uL (ref 0.00–0.07)
Basophils Absolute: 0 10*3/uL (ref 0.0–0.1)
Basophils Relative: 0 %
Eosinophils Absolute: 0.1 10*3/uL (ref 0.0–0.5)
Eosinophils Relative: 1 %
HCT: 36.8 % — ABNORMAL LOW (ref 39.0–52.0)
Hemoglobin: 12 g/dL — ABNORMAL LOW (ref 13.0–17.0)
Immature Granulocytes: 1 %
Lymphocytes Relative: 9 %
Lymphs Abs: 0.9 10*3/uL (ref 0.7–4.0)
MCH: 30.7 pg (ref 26.0–34.0)
MCHC: 32.6 g/dL (ref 30.0–36.0)
MCV: 94.1 fL (ref 80.0–100.0)
Monocytes Absolute: 0.6 10*3/uL (ref 0.1–1.0)
Monocytes Relative: 7 %
Neutro Abs: 8.1 10*3/uL — ABNORMAL HIGH (ref 1.7–7.7)
Neutrophils Relative %: 82 %
Platelets: 336 10*3/uL (ref 150–400)
RBC: 3.91 MIL/uL — ABNORMAL LOW (ref 4.22–5.81)
RDW: 15.7 % — ABNORMAL HIGH (ref 11.5–15.5)
WBC: 9.8 10*3/uL (ref 4.0–10.5)
nRBC: 0 % (ref 0.0–0.2)

## 2023-06-05 LAB — BASIC METABOLIC PANEL WITH GFR
Anion gap: 8 (ref 5–15)
BUN: 36 mg/dL — ABNORMAL HIGH (ref 8–23)
CO2: 27 mmol/L (ref 22–32)
Calcium: 9.1 mg/dL (ref 8.9–10.3)
Chloride: 101 mmol/L (ref 98–111)
Creatinine, Ser: 0.97 mg/dL (ref 0.61–1.24)
GFR, Estimated: >60 mL/min (ref >60)
Glucose, Bld: 96 mg/dL (ref 70–99)
Potassium: 4.4 mmol/L (ref 3.5–5.1)
Sodium: 136 mmol/L (ref 135–145)

## 2023-06-05 MED ORDER — AMOXICILLIN-POT CLAVULANATE 875-125 MG PO TABS
1.0000 | ORAL_TABLET | Freq: Two times a day (BID) | ORAL | Status: DC
  Administered 2023-06-06: 1 via ORAL
  Filled 2023-06-05: qty 1

## 2023-06-05 NOTE — Progress Notes (Signed)
 Student nurse asked patient to ambulate with her assistance.  Patient stated he needed use the restroom.  After using the restroom the patient stated that he was dizzy and too tired to walk at that time.  I the RN, went in 10 minutes later and asked the patient to ambulate and he refused, he stated his legs were too numb for him to walk.

## 2023-06-05 NOTE — Progress Notes (Signed)
 Patient refused to ambulate while monitoring his oxygen saturation.  Patient stated he would get up in about 20 minutes after he finished his coffee.

## 2023-06-05 NOTE — Progress Notes (Signed)
 Patient refused to let me change the dressing and he refused to let me even look under the bandage that was currently on his leg.

## 2023-06-05 NOTE — Progress Notes (Signed)
 Patient refused to ambulate/ mobility.

## 2023-06-05 NOTE — Progress Notes (Signed)
 PROGRESS NOTE    Bruce [name]  ZOX:[id number] DOB: 07-01-1945 DOA: 05/24/2023 PCP: Miki Kins, FNP   Brief Narrative: Bruce [name] is a 78 y.o. male with a history of COPD, chronic respiratory failure on 3 L/min of oxygen, bilateral PE status post thrombolysis, CAD, paroxysmal atrial fibrillation, pulm hypertension, chronic systolic heart failure, medication nonadherence, tobacco use.  Patient presented secondary to worsening shortness of breath and chest discomfort and was found to have evidence of right mainstem PE in addition to acute on chronic heart failure.  Patient was started on heparin IV in addition to Lasix diuresis.  Cardiology was consulted for assistance in management.  Pulmonology was also consulted for assistance in management with recommendation to manage heart failure.  IV diuresis completed.  Patient still with significant hypoxia with ambulation. Pleural fluid labs consistent with empyema and culture significant for streptococcus pneumoniae.   Assessment and Plan:  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sm Large pulmonary artery embolus involving the right main pulmonary artery with extension into the right middle and right lower lobe branches with resolution of previously seen left lower lobe pulmonary artery emboli. Secondary to nonadherence with anticoagulation. Associated BLE chronic DVTs. Patient started on a heparin drip. No right heart strain on CT and  Transthoracic Echocardiogram significant for mildly reduced right ventricular systolic function and normal right ventricular size.  Acute on chronic respiratory failure with hypoxia Secondary to acute PE, acute heart failure, and right pleural effusion.  Hypoxia improved with treatment of heart failure but patient still significantly hypoxic with ambulation requiring up to 6 L/min of supplemental oxygen.  Repeat chest x-ray obtained on 3/26 significant for persistent right moderately sized pleural effusion. Patient  underwent thoracentesis on 3/27. Still requiring significant increase in oxygen with ambulation.  Acute on chronic combined heart failure Cardiology consulted.  Patient managed successfully with Lasix IV.  Echocardiogram obtained this admission significant for an LVEF of 25 to 30% with mild RV dysfunction.  Cardiology recommendations to transition to Lasix p.o. and have signed off. -Hold Lasix PO today and switch to every other day dosing  Right-sided empyema Persistent.  Pulmonology consult on admission with recommendations to defer thoracentesis.  Patient with continued hypoxia. Thoracentesis ordered and performed on 3/27 with improvement of dyspnea. Pleural fluid shows concern for infection with gram stain significant for gram positive cocci consistent with empyema. Patient has been on doxycycline for 7 days. Pleural fluid significant for streptococcus pneumoniae. Repeat chest x-ray (3/31) with stable loculated fluid). ID consulted for antibiotic regimen and duration recommendations. Pulmonology re-consulted on 4/1 with recommendation for CT chest and possible chest tube, however the patient declines. -Infectious disease recommendations: Augmentin x4 weeks from 3/27 with an EOT date of 4/27  Severe pulmonary hypertension Noted.  Patient is nonadherent with outpatient regimen of sildenafil.  Sildenafil resumed this admission. -Continue sildenafil  COPD Noted.  Patient treated for exacerbation with prednisone and breathing treatments.  Completed prednisone burst. -Continue albuterol as needed  Tobacco use Counseled on cessation.  Hyponatremia Mild.  Resolved.  Hypokalemia Resolved with potassium supplementation  Paroxysmal atrial fibrillation -Continue Eliquis and Coreg  Leukocytosis Unclear etiology. Neutrophil predominant. No associated infectious source.  Anemia of chronic disease Hemoglobin stable   DVT prophylaxis: Heparin IV Code Status:   Code Status: Do not attempt  resuscitation (DNR) - Comfort care Family Communication: None at bedside Disposition Plan: Discharge home once patient is able to tolerate ambulation without severe hypoxia on oxygen therapy   Consultants:  Cardiology  Palliative care Pulmonary/Critical care medicine Infectious disease  Procedures:  Right-sided thoracentesis  Antimicrobials: Doxycycline Vancomycin Ceftriaxone   Subjective: No specific concerns today.  Objective: BP 110/62   Pulse 83   Temp 97.7 F (36.5 C)   Resp 17   Ht 6\' 1"  (1.854 m)   Wt 63 kg   SpO2 (!) 88%   BMI 18.32 kg/m   Examination:  General exam: Appears calm and comfortable. Frail appearing Respiratory system: Diminished. Respiratory effort normal. Cardiovascular system: S1 & S2 heard, RRR. No murmurs. Gastrointestinal system: Abdomen is nondistended, soft and nontender. Normal bowel sounds heard. Central nervous system: Alert and oriented. No focal neurological deficits. Musculoskeletal: No edema. No calf tenderness    Data Reviewed: I have personally reviewed following labs and imaging studies  CBC Lab Results  Component Value Date   WBC 9.8 06/05/2023   RBC 3.91 (L) 06/05/2023   HGB 12.0 (L) 06/05/2023   HCT 36.8 (L) 06/05/2023   MCV 94.1 06/05/2023   MCH 30.7 06/05/2023   PLT 336 06/05/2023   MCHC 32.6 06/05/2023   RDW 15.7 (H) 06/05/2023   LYMPHSABS 0.9 06/05/2023   MONOABS 0.6 06/05/2023   EOSABS 0.1 06/05/2023   BASOSABS 0.0 06/05/2023     Last metabolic panel Lab Results  Component Value Date   NA 136 06/05/2023   K 4.4 06/05/2023   CL 101 06/05/2023   CO2 27 06/05/2023   BUN 36 (H) 06/05/2023   CREATININE 0.97 06/05/2023   GLUCOSE 96 06/05/2023   GFRNONAA >60 06/05/2023   GFRAA 49 (L) 09/15/2019   CALCIUM 9.1 06/05/2023   PROT 6.7 05/31/2023   ALBUMIN 3.1 (L) 04/19/2023   LABGLOB 3.0 02/05/2023   BILITOT 1.2 04/19/2023   ALKPHOS 107 04/19/2023   AST 36 04/19/2023   ALT 25 04/19/2023   ANIONGAP  8 06/05/2023    GFR: Estimated Creatinine Clearance: 58.6 mL/min (by C-G formula based on SCr of 0.97 mg/dL).  Recent Results (from the past 240 hours)  Body fluid culture w Gram Stain     Status: Abnormal   Collection Time: 05/31/23  9:58 AM   Specimen: Lung, Right; Pleural Fluid  Result Value Ref Range Status   Specimen Description PLEURAL  Final   Special Requests right lung  Final   Gram Stain   Final    MODERATE WBC PRESENT, PREDOMINANTLY PMN RARE GRAM POSITIVE COCCI IN PAIRS Performed at [facility], [address], [place], [place] [address]    Culture STREPTOCOCCUS PNEUMONIAE (A)  Final   Report Status 06/03/2023 FINAL  Final   Organism ID, Bacteria STREPTOCOCCUS PNEUMONIAE  Final      Susceptibility   Streptococcus pneumoniae - MIC*    ERYTHROMYCIN <=0.12 SENSITIVE Sensitive     LEVOFLOXACIN 1 SENSITIVE Sensitive     VANCOMYCIN 0.5 SENSITIVE Sensitive     PENO - penicillin <=0.06      PENICILLIN (non-meningitis) <=0.06 SENSITIVE Sensitive     PENICILLIN (oral) <=0.06 SENSITIVE Sensitive     CEFTRIAXONE (non-meningitis) <=0.12 SENSITIVE Sensitive     * STREPTOCOCCUS PNEUMONIAE      Radiology Studies: DG CHEST PORT 1 VIEW Result Date: 06/04/2023 CLINICAL DATA:  Empyema.  Shortness of breath. EXAM: PORTABLE CHEST 1 VIEW COMPARISON:  Radiographs 05/31/2023 and 05/30/2023.  CT 05/24/2023. FINDINGS: 0601 hours. No significant change in residual loculated pleural effusion laterally at the right lung base. Stable mild underlying compressive atelectasis. No evidence of pneumothorax. There are stable emphysematous changes and  scattered scarring in both lungs. The heart size and mediastinal contours are stable. IMPRESSION: No significant change in residual loculated right pleural effusion. No pneumothorax or acute findings. Electronically Signed   By: Carey Bullocks M.D.   On: 06/04/2023 09:56       LOS: 12 days    Jacquelin Hawking, MD [facility] 06/05/2023, 3:04  PM   If 7PM-7AM, please contact night-coverage [email or web address]

## 2023-06-05 NOTE — Progress Notes (Signed)
 ID brief note  Afebrile    Latest Ref Rng & Units 06/05/2023    6:52 AM 06/04/2023    8:21 AM 06/03/2023    7:44 AM  CBC  WBC 4.0 - 10.5 K/uL 9.8  9.5  10.6   Hemoglobin 13.0 - 17.0 g/dL 40.9  81.1  91.4   Hematocrit 39.0 - 52.0 % 36.8  37.4  39.5   Platelets 150 - 400 K/uL 336  358  357       Latest Ref Rng & Units 06/05/2023    6:52 AM 06/04/2023    8:21 AM 06/03/2023    7:44 AM  CMP  Glucose 70 - 99 mg/dL 96  782  956   BUN 8 - 23 mg/dL 36  35  37   Creatinine 0.61 - 1.24 mg/dL 2.13  0.86  5.78   Sodium 135 - 145 mmol/L 136  133  135   Potassium 3.5 - 5.1 mmol/L 4.4  4.3  4.0   Chloride 98 - 111 mmol/L 101  99  96   CO2 22 - 32 mmol/L 27  27  31    Calcium 8.9 - 10.3 mg/dL 9.1  8.9  9.2    Seen by pulmonary and patient has reportedly refused CT chest and possible chest tube thereafter, hence, plan for medical management.  Given that, will transition him to PO augmentin, 4 weeks from 3/27. EOT 4/24 Will need to reassess OP with repeat chest xray for further need to continue abtx Monitor CBC and CMP Fu with Pulmonary  Fu appt at [facility] on 4/17 at 10: 30 am, ID will so, recall back with questions or concerns D/w primary team  Odette Fraction, MD Infectious Disease Physician [name] [address] [place], [place] [address] Phone: [phone]  Fax: [phone]

## 2023-06-05 NOTE — Progress Notes (Signed)
 NAME:  Bruce [name], MRN:  [id number], DOB:  Jul 26, 1945, LOS: 12 ADMISSION DATE:  05/24/2023, CONSULTATION DATE:  06/05/2023  REFERRING MD:  Bruce [name], CHIEF COMPLAINT: Shortness of breath  History of Present Illness:  78 year old man with severe COPD, chronic respiratory failure on 3 L of oxygen, CTEPH was evaluated in advanced heart failure clinic day of admission and reported worsening dyspnea on exertion and rest and generalized weakness, noted to be hypoxic and sent to the ED for further evaluation. Of note he has a paramedic visit him once a week and layout his pillbox, CHF note states that paramedic mentioned that he has been throwing medications the trash.  Patient denies this but also does not know that he is on a blood thinner medication. ED labs showed troponin 721, BNP 2431, chest x-ray with a right loculated right pleural effusion. CT angiogram chest showed large pulmonary artery embolus involving right main pulmonary artery extending to right middle and right lower branches.  Resolution of left lower lobe pulmonary artery emboli, large right effusion.   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 EF 40 to 45% with moderately reduced RV function and pulm hypertension 04/2022 nonobstructive CAD, normal LVEDP, severe pulmonary hypertension 85/24 Submassive PE 04/2022 requiring IR thrombectomy, subsequent CT imaging and VQ scan consistent with CTEPH , felt not to be a candidate for thrombectomy Prostate cancer Chronic cor pulmonale   Significant Hospital Events: Including procedures, antibiotic start and stop dates in addition to other pertinent events   3/20 admitted increased WOB and weakness 3/21 pulm asked to see for abnormal CT chest. Seen by cards. Good response to diuretics. 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 Seen by [facility]. DNR confirmed.  3/22 feels better still on heated high flow, discussed merits of diagnostic/therapeutic thoracentesis patient did not want to proceed at this  moment 3/27 IR thoracentesis with cx pos for strep pna 3/28 pulm called re: results of thora. At time too little fluid to drain recommended re-image 2-3 d and if re-accumulated would need CT, otherwise prolonged ABX course  3/31 seen by ID.  4/1 PCM reconsulted for loculated right empyema and recommendations.  Interim History / Subjective:  Still feels poor. WOB still sig. Does not feel better  Objective   Blood pressure 110/62, pulse 83, temperature 97.7 F (36.5 C), resp. rate 17, height 6\' 1"  (1.854 m), weight 63 kg, SpO2 (!) 88%.        Intake/Output Summary (Last 24 hours) at 06/05/2023 1139 Last data filed at 06/05/2023 0453 Gross per 24 hour  Intake 480 ml  Output 1040 ml  Net -560 ml   Filed Weights   06/03/23 0500 06/04/23 0500 06/05/23 0500  Weight: 63 kg 63 kg 63 kg    Examination: General frail 78 year old male laying in bed. Gets SOB talking. He's very emotional and frustrated Hent NCAT temporal wasting Pulm dec'd t/o sig decreased Right lateral chest/. + accessory use on 6 lpm  Card rrr Abd soft Ext right foot pain  Neuro intact but frustrated   Ancillary tests  Small to moderate effusion on right.   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 I spent several minutes explaining to him the reason to proceed should we find sig fluid collection but Bruce [name] was held up on logistics. As a) we would need to hold his oral AC again b) placing CT would commit him to at least 3-4 more days in the hospital he absolutely refuses this.    Plan Continued 4-6 weeks abx Walking oximetry prior to dc Will dc CT order (no point if he refused CT again) I have strongly recommended he consider home hospice or hospice care. He is also resistant to this but more so because of his living situation at home.   We will s/o. If pleural space fluid collection is adequate size and can be safely drained it should. Weather or not it impacts his breathing much is hard to tell. Either way he refuses any intervention at this point so afraid we have little to offer.       Best Practice (right click and "Reselect all SmartList Selections" daily)   Per primary

## 2023-06-06 ENCOUNTER — Inpatient Hospital Stay (HOSPITAL_COMMUNITY)

## 2023-06-06 ENCOUNTER — Other Ambulatory Visit (HOSPITAL_COMMUNITY): Payer: Self-pay

## 2023-06-06 DIAGNOSIS — I2699 Other pulmonary embolism without acute cor pulmonale: Secondary | ICD-10-CM | POA: Diagnosis not present

## 2023-06-06 LAB — BASIC METABOLIC PANEL WITH GFR
Anion gap: 7 (ref 5–15)
BUN: 36 mg/dL — ABNORMAL HIGH (ref 8–23)
CO2: 23 mmol/L (ref 22–32)
Calcium: 8.9 mg/dL (ref 8.9–10.3)
Chloride: 105 mmol/L (ref 98–111)
Creatinine, Ser: 0.88 mg/dL (ref 0.61–1.24)
GFR, Estimated: >60 mL/min (ref >60)
Glucose, Bld: 95 mg/dL (ref 70–99)
Potassium: 4.7 mmol/L (ref 3.5–5.1)
Sodium: 135 mmol/L (ref 135–145)

## 2023-06-06 LAB — CBC WITH DIFFERENTIAL/PLATELET
Abs Immature Granulocytes: 0.05 10*3/uL (ref 0.00–0.07)
Basophils Absolute: 0 10*3/uL (ref 0.0–0.1)
Basophils Relative: 0 %
Eosinophils Absolute: 0.1 10*3/uL (ref 0.0–0.5)
Eosinophils Relative: 1 %
HCT: 35.2 % — ABNORMAL LOW (ref 39.0–52.0)
Hemoglobin: 11.5 g/dL — ABNORMAL LOW (ref 13.0–17.0)
Immature Granulocytes: 1 %
Lymphocytes Relative: 7 %
Lymphs Abs: 0.7 10*3/uL (ref 0.7–4.0)
MCH: 30 pg (ref 26.0–34.0)
MCHC: 32.7 g/dL (ref 30.0–36.0)
MCV: 91.9 fL (ref 80.0–100.0)
Monocytes Absolute: 0.7 10*3/uL (ref 0.1–1.0)
Monocytes Relative: 7 %
Neutro Abs: 9.2 10*3/uL — ABNORMAL HIGH (ref 1.7–7.7)
Neutrophils Relative %: 84 %
Platelets: 325 10*3/uL (ref 150–400)
RBC: 3.83 MIL/uL — ABNORMAL LOW (ref 4.22–5.81)
RDW: 15.9 % — ABNORMAL HIGH (ref 11.5–15.5)
WBC: 10.8 10*3/uL — ABNORMAL HIGH (ref 4.0–10.5)
nRBC: 0 % (ref 0.0–0.2)

## 2023-06-06 MED ORDER — AMOXICILLIN-POT CLAVULANATE 875-125 MG PO TABS
1.0000 | ORAL_TABLET | Freq: Two times a day (BID) | ORAL | 0 refills | Status: AC
  Filled 2023-06-06: qty 44, 22d supply, fill #0

## 2023-06-06 MED ORDER — CARVEDILOL 3.125 MG PO TABS
3.1250 mg | ORAL_TABLET | Freq: Two times a day (BID) | ORAL | 0 refills | Status: DC
  Filled 2023-06-06: qty 60, 30d supply, fill #0

## 2023-06-06 MED ORDER — OXYMETAZOLINE HCL 0.05 % NA SOLN
1.0000 | Freq: Two times a day (BID) | NASAL | 0 refills | Status: DC | PRN
  Filled 2023-06-06: qty 30, 30d supply, fill #0

## 2023-06-06 MED ORDER — ENSURE ENLIVE PO LIQD
237.0000 mL | Freq: Two times a day (BID) | ORAL | 0 refills | Status: DC
  Filled 2023-06-06: qty 10000, 21d supply, fill #0

## 2023-06-06 MED ORDER — VASHE WOUND 0.033 % EX SOLN
1.0000 | Freq: Every day | CUTANEOUS | 0 refills | Status: DC
  Filled 2023-06-06: qty 1000, fill #0

## 2023-06-06 MED ORDER — OXYMETAZOLINE HCL 0.05 % NA SOLN
1.0000 | Freq: Two times a day (BID) | NASAL | Status: DC
  Filled 2023-06-06: qty 30

## 2023-06-06 NOTE — Plan of Care (Signed)
  Problem: Education: Goal: Knowledge of General Education information will improve Description: Including pain rating scale, medication(s)/side effects and non-pharmacologic comfort measures Outcome: Progressing   Problem: Clinical Measurements: Goal: Ability to maintain clinical measurements within normal limits will improve Outcome: Progressing Goal: Will remain free from infection Outcome: Progressing Goal: Respiratory complications will improve Outcome: Progressing   Problem: Nutrition: Goal: Adequate nutrition will be maintained Outcome: Progressing   Problem: Coping: Goal: Level of anxiety will decrease Outcome: Progressing   Problem: Elimination: Goal: Will not experience complications related to urinary retention Outcome: Progressing   Problem: Safety: Goal: Ability to remain free from injury will improve Outcome: Progressing

## 2023-06-06 NOTE — Plan of Care (Signed)
  Problem: Education: Goal: Knowledge of General Education information will improve Description: Including pain rating scale, medication(s)/side effects and non-pharmacologic comfort measures 06/06/2023 1416 by Kelli Hope, RN Outcome: Adequate for Discharge 06/06/2023 1416 by Kelli Hope, RN Outcome: Adequate for Discharge   Problem: Health Behavior/Discharge Planning: Goal: Ability to manage health-related needs will improve 06/06/2023 1416 by Kelli Hope, RN Outcome: Adequate for Discharge 06/06/2023 1416 by Kelli Hope, RN Outcome: Adequate for Discharge   Problem: Clinical Measurements: Goal: Ability to maintain clinical measurements within normal limits will improve 06/06/2023 1416 by Kelli Hope, RN Outcome: Adequate for Discharge 06/06/2023 1416 by Kelli Hope, RN Outcome: Adequate for Discharge Goal: Will remain free from infection 06/06/2023 1416 by Kelli Hope, RN Outcome: Adequate for Discharge 06/06/2023 1416 by Kelli Hope, RN Outcome: Adequate for Discharge Goal: Diagnostic test results will improve 06/06/2023 1416 by Kelli Hope, RN Outcome: Adequate for Discharge 06/06/2023 1416 by Kelli Hope, RN Outcome: Adequate for Discharge Goal: Respiratory complications will improve 06/06/2023 1416 by Kelli Hope, RN Outcome: Adequate for Discharge 06/06/2023 1416 by Kelli Hope, RN Outcome: Adequate for Discharge Goal: Cardiovascular complication will be avoided 06/06/2023 1416 by Kelli Hope, RN Outcome: Adequate for Discharge 06/06/2023 1416 by Kelli Hope, RN Outcome: Adequate for Discharge   Problem: Activity: Goal: Risk for activity intolerance will decrease 06/06/2023 1416 by Kelli Hope, RN Outcome: Adequate for Discharge 06/06/2023 1416 by Kelli Hope, RN Outcome: Adequate for Discharge   Problem: Nutrition: Goal: Adequate nutrition will be maintained 06/06/2023 1416 by Kelli Hope, RN Outcome:  Adequate for Discharge 06/06/2023 1416 by Kelli Hope, RN Outcome: Adequate for Discharge   Problem: Coping: Goal: Level of anxiety will decrease 06/06/2023 1416 by Kelli Hope, RN Outcome: Adequate for Discharge 06/06/2023 1416 by Kelli Hope, RN Outcome: Adequate for Discharge   Problem: Elimination: Goal: Will not experience complications related to bowel motility 06/06/2023 1416 by Kelli Hope, RN Outcome: Adequate for Discharge 06/06/2023 1416 by Kelli Hope, RN Outcome: Adequate for Discharge Goal: Will not experience complications related to urinary retention 06/06/2023 1416 by Kelli Hope, RN Outcome: Adequate for Discharge 06/06/2023 1416 by Kelli Hope, RN Outcome: Adequate for Discharge   Problem: Pain Managment: Goal: General experience of comfort will improve and/or be controlled 06/06/2023 1416 by Kelli Hope, RN Outcome: Adequate for Discharge 06/06/2023 1416 by Kelli Hope, RN Outcome: Adequate for Discharge   Problem: Safety: Goal: Ability to remain free from injury will improve 06/06/2023 1416 by Kelli Hope, RN Outcome: Adequate for Discharge 06/06/2023 1416 by Kelli Hope, RN Outcome: Adequate for Discharge   Problem: Skin Integrity: Goal: Risk for impaired skin integrity will decrease 06/06/2023 1416 by Kelli Hope, RN Outcome: Adequate for Discharge 06/06/2023 1416 by Kelli Hope, RN Outcome: Adequate for Discharge

## 2023-06-06 NOTE — Progress Notes (Signed)
 Occupational therapist ambulated patient with pulse oximetry. the pt when OT came in he was getting dressed without his oxygen on (I'm not sure for how long). He was on 4L in room and seated O2 at 90, with walking in hall it was 80, increased it to 6L and was able to get back to 85, post walking it was 84 with cues for pursed lip breathing it increased to 90. Also his nose was bleeding a little when he blew it, he claimed he just scratched it, it stopped after applying pressure.

## 2023-06-06 NOTE — Plan of Care (Signed)

## 2023-06-06 NOTE — Progress Notes (Signed)
 Occupational Therapy Treatment Patient Details Name: Bruce [name] MRN: [id number] DOB: 03/14/1945 Today's Date: 06/06/2023   History of present illness 78 yo male presents to [facility] with SOB and chest pain. , CTA chest showed right mainstem PE. Recent admission 2/25 with acute on chronic respiratory failure and volume overload, flu A. PMH: prostate cancer, CHF, COPD, coronary artery disease, GERD, HTN, MI, PVD, HLD, PAD, Hx of DVT & PE, NSTEMI, Afib, pulmonary hypertension, medical noncompliance   OT comments  Pt progressing well towards goals. Upon OT entry pt was dressing seated EOB without [place] on. Per RN MD requesting O2 stats while ambulating. After pt dressed, pt ambulated in hallway with s for safety. Pt was on 4L in room and seated O2 at 90, with walking in hall it was 80, increased it to 6L and was able to get back to 85, post walking it was 84 with cues for pursed lip breathing it increased to 90. Concerns with pt compliance with O2 at home upon d/c. During mobility pt reporting he is fatigued, but insisting to keep walking. Continue to recommend HHOT to optimize independence levels and promote safe d/c home. Will continue to follow acutely.       If plan is discharge home, recommend the following:  Assistance with cooking/housework   Equipment Recommendations  None recommended by OT       Precautions / Restrictions Precautions Precautions: Fall Recall of Precautions/Restrictions: Intact Precaution/Restrictions Comments: monitor O2 (wears 3-4 L O2 at baseline) Restrictions Weight Bearing Restrictions Per Provider Order: No       Mobility Bed Mobility Overal bed mobility: Modified Independent             General bed mobility comments: Pt received sitting EOB    Transfers Overall transfer level: Needs assistance Equipment used: Rolling walker (2 wheels) Transfers: Sit to/from Stand, Bed to chair/wheelchair/BSC Sit to Stand: Supervision     Step pivot transfers:  Supervision     General transfer comment: S for safety, use of RW     Balance Overall balance assessment: Needs assistance, History of Falls Sitting-balance support: No upper extremity supported, Feet supported Sitting balance-Leahy Scale: Fair     Standing balance support: Bilateral upper extremity supported Standing balance-Leahy Scale: Fair Standing balance comment: Reliant on RW         ADL either performed or assessed with clinical judgement   ADL Overall ADL's : Needs assistance/impaired                 Upper Body Dressing : Set up;Sitting   Lower Body Dressing: Supervision/safety;Sit to/from stand Lower Body Dressing Details (indicate cue type and reason): pants and shoes Toilet Transfer: Supervision/safety;Rolling walker (2 wheels) Toilet Transfer Details (indicate cue type and reason): Simulated         Functional mobility during ADLs: Supervision/safety;Rolling walker (2 wheels) General ADL Comments: s for safety concerns with management of O2 and pt compliance with     Extremity/Trunk Assessment Upper Extremity Assessment Upper Extremity Assessment: Overall WFL for tasks assessed   Lower Extremity Assessment Lower Extremity Assessment: Defer to PT evaluation        Vision   Vision Assessment?: No apparent visual deficits         Communication Communication Communication: No apparent difficulties Factors Affecting Communication: Hearing impaired   Cognition Arousal: Alert Behavior During Therapy: Agitated Cognition: No apparent impairments             OT - Cognition Comments: at baseline  but hyperverbose about complaints regarding hospital stay and personal life- difficult to redirect to tasks. pt prefers to direct session                 Following commands: Intact        Cueing   Cueing Techniques: Verbal cues        General Comments Pt was on 4L in room and seated O2 at 90, with walking in hall it was 80, increased  it to 6L and was able to get back to 85, post walking it was 84 with cues for pursed lip breathing it increased to 90    Pertinent Vitals/ Pain       Pain Assessment Pain Assessment: No/denies pain   Frequency  Min 1X/week        Progress Toward Goals  OT Goals(current goals can now be found in the care plan section)  Progress towards OT goals: Progressing toward goals  Acute Rehab OT Goals Patient Stated Goal: To go home OT Goal Formulation: With patient Time For Goal Achievement: 06/11/23 Potential to Achieve Goals: Good ADL Goals Pt Will Perform Grooming: with modified independence;standing Pt Will Perform Lower Body Bathing: with contact guard assist;sit to/from stand Pt Will Transfer to Toilet: with modified independence;ambulating Pt/caregiver will Perform Home Exercise Program: Increased strength;Both right and left upper extremity;With theraband;Independently;With written HEP provided Additional ADL Goal #1: Pt to verbalize at least 3 energy conservation strategies to implement at home  Plan         AM-PAC OT "6 Clicks" Daily Activity     Outcome Measure   Help from another person eating meals?: None Help from another person taking care of personal grooming?: A Little Help from another person toileting, which includes using toliet, bedpan, or urinal?: A Little Help from another person bathing (including washing, rinsing, drying)?: A Little Help from another person to put on and taking off regular upper body clothing?: A Little Help from another person to put on and taking off regular lower body clothing?: A Little 6 Click Score: 19    End of Session Equipment Utilized During Treatment: Oxygen (4L)  OT Visit Diagnosis: Muscle weakness (generalized) (M62.81);Other abnormalities of gait and mobility (R26.89)   Activity Tolerance Patient tolerated treatment well   Patient Left in bed;with call bell/phone within reach   Nurse Communication Mobility status         Time: 1610-9604 OT Time Calculation (min): 17 min  Charges: OT General Charges $OT Visit: 1 Visit OT Treatments $Self Care/Home Management : 8-22 mins  Bruce [name], OT  Acute Rehabilitation Services Office [phone] Secure chat preferred   Bruce [name] 06/06/2023, 2:47 PM

## 2023-06-06 NOTE — TOC Progression Note (Signed)
 Transition of Care (TOC) - Progression Note   Spoke to patient at bedside. Confined he does not want home health services.   He has home oxygen already he is not sure of name of company , but states he has his portable tank in his hospital room.  Patient Details  Name: Bruce [name] MRN: [id number] Date of Birth: 29-Apr-1945  Transition of Care [facility]) CM/SW Contact  Tanish Prien, Adria Devon, RN Phone Number: 06/06/2023, 12:07 PM  Clinical Narrative:       Expected Discharge Plan: Home w Home Health Services Barriers to Discharge: Continued Medical Work up  Expected Discharge Plan and Services In-house Referral: NA Discharge Planning Services: CM Consult Post Acute Care Choice: NA Living arrangements for the past 2 months: Mobile Home                 DME Arranged: N/A DME Agency: NA       HH Arranged: PT, Patient Refused HH           Social Determinants of Health (SDOH) Interventions SDOH Screenings   Food Insecurity: No Food Insecurity (05/25/2023)  Recent Concern: Food Insecurity - Food Insecurity Present (05/24/2023)  Housing: Low Risk  (05/25/2023)  Recent Concern: Housing - High Risk (04/19/2023)  Transportation Needs: Unmet Transportation Needs (05/25/2023)  Utilities: Not At Risk (05/25/2023)  Financial Resource Strain: High Risk (04/12/2022)  Physical Activity: Inactive (02/05/2017)  Social Connections: Unknown (05/25/2023)  Stress: Stress Concern Present (02/05/2017)  Tobacco Use: Medium Risk (05/24/2023)    Readmission Risk Interventions    05/28/2023    5:06 PM  Readmission Risk Prevention Plan  Transportation Screening Complete  PCP or Specialist Appt within 5-7 Days Complete  Home Care Screening Complete  Medication Review (RN CM) Complete

## 2023-06-06 NOTE — Progress Notes (Signed)
 Upon entering the room the patient was sitting on his bed with 4L of oxygen via [place], his oxygen saturation was at 99%.  Once patient got up and started ambulating to the wheelchair his oxygen level decreased to 82%, I increased his oxygen to 6L and his O2 level increased to 88%.  AVS given and reviewed with pt. Medications discussed.  Pt. Verbalized understanding of AVS education. Patient escorted of unit via wheelchair with all belongings.

## 2023-06-07 ENCOUNTER — Telehealth: Payer: Self-pay

## 2023-06-07 ENCOUNTER — Telehealth (HOSPITAL_COMMUNITY): Payer: Self-pay

## 2023-06-07 NOTE — Discharge Summary (Signed)
 Physician Discharge Summary   Patient: Bruce [name] MRN: [id number] DOB: 1946/01/07  Admit date:     05/24/2023  Discharge date: 06/06/2023  Discharge Physician: Lynden Oxford  PCP: Miki Kins, FNP  Recommendations at discharge: Follow-up with PCP in 1 week. Follow-up with cardiology as recommended. Follow-up with pulmonary as recommended. Follow-up with ID as recommended. Patient was recommended home health but has refused. Patient was recommended chest tube placement but refused.   Follow-up Information     [facility] Follow up.   Specialty: Cardiology Why: Hospital follow-up visit in the [facility] scheduled for 06/14/2023 at 2pm. Please arrive 15 minutes early for check-in. If this date/ time does not work for you, please call our office to reschedule. Contact information: [address] [place] [address] [phone]        Miki Kins, FNP. Schedule an appointment as soon as possible for a visit in 1 week(s).   Specialty: Family Medicine Why: For wound re-check Contact information: [address] [place] [address] [phone]         [facility]. Go on 06/21/2023.   Specialty: Infectious Diseases Contact information: [address] [place], [address] [place] [address] [phone]               Discharge Diagnoses: Principal Problem:   Pulmonary embolism on right [facility]) Active Problems:   Pulmonary hypertension, unspecified (HCC)   Acute on chronic combined systolic and diastolic CHF (congestive heart failure) (HCC)   Pleural effusion, right   CTEPH (chronic thromboembolic pulmonary hypertension) (HCC)   NICM (nonischemic cardiomyopathy) (HCC)   Pulmonary arterial hypertension (HCC)   Noncompliance   Biventricular heart failure [facility])    Hospital Course: Bruce [name] is a 78 y.o.  male with a history of COPD, chronic respiratory failure on 3 L/min of oxygen, bilateral PE status post thrombolysis, CAD, paroxysmal atrial fibrillation, pulm hypertension, chronic systolic heart failure, medication nonadherence, tobacco use.  Patient presented secondary to worsening shortness of breath and chest discomfort and was found to have evidence of right mainstem PE in addition to acute on chronic heart failure.  Patient was started on heparin IV in addition to Lasix diuresis.  Cardiology was consulted for assistance in management.  Pulmonology was also consulted for assistance in management with recommendation to manage heart failure.  IV diuresis completed.  Patient still with significant hypoxia with ambulation. Pleural fluid labs consistent with empyema and culture significant for streptococcus pneumoniae.  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sm Large pulmonary artery embolus involving the right main pulmonary artery with extension into the right middle and right lower lobe branches with resolution of previously seen left lower lobe pulmonary artery emboli. Secondary to nonadherence with anticoagulation. Associated BLE chronic DVTs.  Patient started on a heparin drip.  No right heart strain on CT and  Transthoracic Echocardiogram significant for mildly reduced right ventricular systolic function and normal right ventricular size. Switch to Eliquis in the hospital.  Continue home regimen.   Acute on chronic respiratory failure with hypoxia Secondary to acute PE, acute heart failure, and right pleural effusion.  Hypoxia improved with treatment of heart failure but patient still significantly hypoxic with ambulation requiring up to 6 L/min of supplemental oxygen.  Repeat chest x-ray obtained on 3/26 significant for persistent right moderately sized pleural effusion. Patient underwent thoracentesis on 3/27. Still  requiring significant increase in oxygen with ambulation.   Acute on chronic  combined systolic and diastolic heart failure Cardiology consulted.  Patient managed successfully with Lasix IV.  Echocardiogram obtained this admission significant for an LVEF of 25 to 30% with mild RV dysfunction.  Cardiology recommendations to transition to Lasix p.o. and have signed off. Continue on discharge.   Right-sided empyema Persistent.  Pulmonology consult on admission with recommendations to defer thoracentesis.  Patient with continued hypoxia. Thoracentesis ordered and performed on 3/27 with improvement of dyspnea. Pleural fluid shows concern for infection with gram stain significant for gram positive cocci consistent with empyema. Patient has been on doxycycline for 7 days. Pleural fluid significant for streptococcus pneumoniae. Repeat chest x-ray (3/31) with stable loculated fluid). ID consulted for antibiotic regimen and duration recommendations. Pulmonology re-consulted on 4/1 with recommendation for CT chest and possible chest tube, however the patient declines. -Infectious disease recommendations: Augmentin x4 weeks from 3/27 with an EOT date of 4/27   Severe pulmonary hypertension Noted.  Patient is nonadherent with outpatient regimen of sildenafil.  Sildenafil resumed this admission. -Continue sildenafil   COPD Noted.  Patient treated for exacerbation with prednisone and breathing treatments.  Completed prednisone burst. -Continue albuterol as needed   Tobacco use Counseled on cessation.   Hyponatremia Mild.  Resolved.   Hypokalemia Resolved with potassium supplementation   Paroxysmal atrial fibrillation -Continue Eliquis and Coreg   Leukocytosis Unclear etiology. Neutrophil predominant. No associated infectious source.   Anemia of chronic disease Hemoglobin stable   Consultants:  ID Wound care Palliative care PCCM Cardiology  Procedures performed:  Ultrasound thoracentesis 3/27 Echocardiogram   DISCHARGE MEDICATION: Allergies as of 06/06/2023        Reactions   Adhesive [tape] Other (See Comments)   After right leg fracture surgery, pt developed a large blister where tape was applied to his right leg. OK to use paper tape.   Eggs-apples-oats [alitraq] Other (See Comments)   Caused chest pains   Imdur [isosorbide Dinitrate] Other (See Comments)   hallucinations   Singulair [montelukast Sodium] Other (See Comments)   Hallucinations        Medication List     STOP taking these medications    amiodarone 200 MG tablet Commonly known as: PACERONE   digoxin 0.125 MG tablet Commonly known as: LANOXIN   nitroGLYCERIN 0.4 MG SL tablet Commonly known as: NITROSTAT   pantoprazole 40 MG tablet Commonly known as: Protonix       TAKE these medications    Acetaminophen Extra Strength 500 MG Tabs Take 2 tablets (1,000 mg total) by mouth every 6 (six) hours as needed for mild pain (pain score 1-3), moderate pain (pain score 4-6), fever or headache.   albuterol 108 (90 Base) MCG/ACT inhaler Commonly known as: VENTOLIN HFA TAKE TWO PUFFS BY MOUTH AS NEEDED FOR WHEEZING EVERY 4-6 HOURS   ALPRAZolam 0.5 MG tablet Commonly known as: XANAX Take 1 tablet (0.5 mg total) by mouth 2 (two) times daily as needed for anxiety.   amoxicillin-clavulanate 875-125 MG tablet Commonly known as: AUGMENTIN Take 1 tablet by mouth 2 (two) times daily for 22 days.   budesonide 0.25 MG/2ML nebulizer solution Commonly known as: PULMICORT Take 2 mLs (0.25 mg total) by nebulization 2 (two) times daily.   carvedilol 3.125 MG tablet Commonly known as: COREG Take 1 tablet (3.125 mg total) by mouth 2 (two) times daily with a meal.   cetirizine 10 MG tablet Commonly known as: ZYRTEC Take 1 tablet (  10 mg total) by mouth daily.   cholecalciferol 25 MCG (1000 UNIT) tablet Commonly known as: VITAMIN D3 Take 1,000 Units by mouth in the morning.   Eliquis 5 MG Tabs tablet Generic drug: apixaban TAKE ONE TABLET (5 MG TOTAL) BY MOUTH TWO (TWO) TIMES  DAILY.   Farxiga 10 MG Tabs tablet Generic drug: dapagliflozin propanediol TAKE ONE TABLET (10 MG TOTAL) BY MOUTH DAILY.   feeding supplement Liqd Take 237 mLs by mouth 2 (two) times daily between meals.   FISH OIL PO Take 1,000 mg by mouth every evening.   fluticasone 50 MCG/ACT nasal spray Commonly known as: FLONASE Place 1 spray into both nostrils daily as needed for allergies.   furosemide 20 MG tablet Commonly known as: Lasix Take 2 tablets (40 mg total) by mouth daily.   gabapentin 100 MG capsule Commonly known as: NEURONTIN TAKE ONE CAPSULE (100 MG TOTAL) BY MOUTH THREE (THREE) TIMES DAILY.   lidocaine 5 % Commonly known as: LIDODERM Place 1 patch onto the skin daily. Remove & Discard patch within 12 hours or as directed by MD   Medical Compression Stockings Misc Wear daily as needed   multivitamin tablet Take 1 tablet by mouth in the morning. For Men   omeprazole 40 MG capsule Commonly known as: PRILOSEC Take 1 capsule (40 mg total) by mouth 2 (two) times daily.   oxymetazoline 0.05 % nasal spray Commonly known as: AFRIN Place 1 spray into both nostrils 2 (two) times daily as needed (nose bleed).   rOPINIRole 1 MG tablet Commonly known as: REQUIP TAKE ONE TABLET (1 MG TOTAL) BY MOUTH IN THE MORNING AND AT BEDTIME.   rosuvastatin 20 MG tablet Commonly known as: CRESTOR Take 1 tablet (20 mg total) by mouth daily.   sildenafil 20 MG tablet Commonly known as: REVATIO Take 1 tablet (20 mg total) by mouth 3 (three) times daily.   Stiolto Respimat 2.5-2.5 MCG/ACT Aers Generic drug: Tiotropium Bromide-Olodaterol Inhale 2 puffs into the lungs daily.   Vashe Wound 0.033 % Soln Apply 1 Act topically daily at 12 noon.               Discharge Care Instructions  (From admission, onward)           Start     Ordered   06/06/23 0000  Discharge wound care:       Comments: cleanse right malleolus wound with VASHE and pat dry. Apply VASHE moist gauze  to wound bed.  Cover with silicone foam.  Change daily.   06/06/23 1132           Disposition: Home Diet recommendation: Cardiac diet  Discharge Exam: Vitals:   06/06/23 0848 06/06/23 1300 06/06/23 1500 06/06/23 1503  BP: 106/68     Pulse: 92     Resp:      Temp:      TempSrc:      SpO2:  (!) 85% 99% (!) 88%  Weight:      Height:       General: Appear in mild distress; no visible Abnormal Neck Mass Or lumps, Conjunctiva normal Cardiovascular: S1 and S2 Present, no Murmur, Respiratory: good respiratory effort, Bilateral Air entry present and basal crackles, no wheezes Abdomen: Bowel Sound present, Non tender  Extremities: no Pedal edema Neurology: alert and oriented to time, place, and person  Filed Weights   06/04/23 0500 06/05/23 0500 06/06/23 0500  Weight: 63 kg 63 kg 66.6 kg   Condition at discharge: stable  The results of significant diagnostics from this hospitalization (including imaging, microbiology, ancillary and laboratory) are listed below for reference.   Imaging Studies: DG Ankle Right Port Result Date: 06/06/2023 CLINICAL DATA:  Ankle pain EXAM: PORTABLE RIGHT ANKLE - 2 VIEW COMPARISON:  11/02/2022 FINDINGS: Frontal and lateral views of the right ankle are obtained. No acute fracture, subluxation, or dislocation. Ankle mortise is intact. Joint spaces are well preserved. Soft tissues are unremarkable. IMPRESSION: 1. Unremarkable right ankle. Electronically Signed   By: Sharlet Salina M.D.   On: 06/06/2023 15:35   DG CHEST PORT 1 VIEW Result Date: 06/04/2023 CLINICAL DATA:  Empyema.  Shortness of breath. EXAM: PORTABLE CHEST 1 VIEW COMPARISON:  Radiographs 05/31/2023 and 05/30/2023.  CT 05/24/2023. FINDINGS: 0601 hours. No significant change in residual loculated pleural effusion laterally at the right lung base. Stable mild underlying compressive atelectasis. No evidence of pneumothorax. There are stable emphysematous changes and scattered scarring in both lungs.  The heart size and mediastinal contours are stable. IMPRESSION: No significant change in residual loculated right pleural effusion. No pneumothorax or acute findings. Electronically Signed   By: Carey Bullocks M.D.   On: 06/04/2023 09:56   DG Chest 1 View Result Date: 05/31/2023 CLINICAL DATA:  Status post right thoracentesis. EXAM: CHEST  1 VIEW COMPARISON:  May 30, 2023. FINDINGS: No definite pneumothorax is noted. Grossly stable loculated right pleural effusion is noted. IMPRESSION: No definite pneumothorax is noted status post thoracentesis. Electronically Signed   By: Lupita Raider M.D.   On: 05/31/2023 13:53   IR THORACENTESIS ASP PLEURAL SPACE W/IMG GUIDE Result Date: 05/31/2023 INDICATION: 77 year old male with right pleural effusion for therapeutic and diagnostic paracentesis. EXAM: ULTRASOUND GUIDED RIGHT THORACENTESIS MEDICATIONS: 20 mL 1% Lidocaine COMPLICATIONS: None immediate. PROCEDURE: An ultrasound guided thoracentesis was thoroughly discussed with the patient and questions answered. The benefits, risks, alternatives and complications were also discussed. The patient understands and wishes to proceed with the procedure. Written consent was obtained. Ultrasound was performed to localize and mark an adequate pocket of fluid in the right chest. The area was then prepped and draped in the normal sterile fashion. 1% Lidocaine was used for local anesthesia. Under ultrasound guidance a 8 Fr Safe-T-Centesis catheter was introduced. Thoracentesis was performed. The catheter was removed and a dressing applied. FINDINGS: A total of approximately 230 mL of cloudy, orange fluid was removed. Samples were sent to the laboratory as requested by the clinical team. IMPRESSION: Successful ultrasound guided right thoracentesis yielding 230 mL of pleural fluid. Performed By Theresa Mulligan, PA-C Electronically Signed   By: Marliss Coots M.D.   On: 05/31/2023 10:36   DG CHEST PORT 1 VIEW Result Date:  05/30/2023 CLINICAL DATA:  Hypoxia EXAM: PORTABLE CHEST 1 VIEW COMPARISON:  Chest x-ray 05/27/2023.  Chest CT 05/24/2023. FINDINGS: Moderate-sized right pleural effusion is unchanged. Strandy opacities in the right mid lung are unchanged. Left lung is grossly clear. Cardiac silhouette is mildly enlarged similar to the prior study. Enlarged right main pulmonary artery is again noted. No pneumothorax or acute fracture. IMPRESSION: 1. Stable moderate-sized right pleural effusion. 2. Stable Strandy opacities in the right mid lung. Electronically Signed   By: Darliss Cheney M.D.   On: 05/30/2023 18:14   DG Chest Port 1 View Result Date: 05/27/2023 CLINICAL DATA:  Loculated pleural effusion. EXAM: PORTABLE CHEST 1 VIEW COMPARISON:  05/25/2023 FINDINGS: Loculated right pleural effusion is similar to prior. Right base collapse/consolidation again noted. Left lung remains clear. Cardiopericardial silhouette  is at upper limits of normal for size. Fullness in the right hilar region again noted. No acute bony abnormality. Telemetry leads overlie the chest. IMPRESSION: 1. No significant interval change. 2. Loculated right pleural effusion with fullness in the right hilar region and right base collapse/consolidation. Electronically Signed   By: Kennith Center M.D.   On: 05/27/2023 08:56   ECHOCARDIOGRAM COMPLETE Result Date: 05/25/2023    ECHOCARDIOGRAM REPORT   Patient Name:   Bruce [name] Date of Exam: 05/25/2023 Medical Rec #:  [id number]      Height:       73.0 in Accession #:    [id number]     Weight:       147.7 lb Date of Birth:  May 29, 1945      BSA:          1.892 m Patient Age:    77 years       BP:           113/76 mmHg Patient Gender: M              HR:           80 bpm. Exam Location:  Inpatient Procedure: 2D Echo, Cardiac Doppler, Color Doppler and Intracardiac            Opacification Agent (Both Spectral and Color Flow Doppler were            utilized during procedure). Indications:    Pulmonary Embolus I26.09   History:        Patient has prior history of Echocardiogram examinations, most                 recent 01/16/2023. CHF, CAD and Previous Myocardial Infarction,                 COPD, Arrythmias:Atrial Fibrillation, Signs/Symptoms:Syncope;                 Risk Factors:Dyslipidemia.  Sonographer:    Lucendia Herrlich RCS Referring Phys: [phone] RAKESH V ALVA  Sonographer Comments: Technically difficult study due to poor echo windows. Image acquisition challenging due to COPD and Image acquisition challenging due to respiratory motion. IMPRESSIONS  1. Left ventricular ejection fraction, by estimation, is 25 to 30%. Left ventricular ejection fraction by PLAX is 25 %. The left ventricle has severely decreased function. The left ventricle demonstrates global hypokinesis. There is mild left ventricular hypertrophy. Left ventricular diastolic parameters are consistent with Grade I diastolic dysfunction (impaired relaxation). There is the interventricular septum is flattened in systole and diastole, consistent with right ventricular pressure and volume overload.  2. Right ventricular systolic function is mildly reduced. The right ventricular size is normal. There is mildly elevated pulmonary artery systolic pressure. The estimated right ventricular systolic pressure is 37.7 mmHg.  3. The mitral valve is abnormal. Trivial mitral valve regurgitation.  4. Tricuspid valve regurgitation is mild to moderate.  5. The aortic valve is tricuspid. Aortic valve regurgitation is not visualized. Aortic valve sclerosis/calcification is present, without any evidence of aortic stenosis.  6. Aortic dilatation noted. There is borderline dilatation of the aortic root, measuring 38 mm.  7. The inferior vena cava is dilated in size with <50% respiratory variability, suggesting right atrial pressure of 15 mmHg. Comparison(s): Changes from prior study are noted. 01/16/2023: LVEF 40-45%. FINDINGS  Left Ventricle: Left ventricular ejection fraction, by  estimation, is 25 to 30%. Left ventricular ejection fraction by PLAX is 25 %. The left ventricle has severely decreased  function. The left ventricle demonstrates global hypokinesis. Definity contrast agent was given IV to delineate the left ventricular endocardial borders. The left ventricular internal cavity size was normal in size. There is mild left ventricular hypertrophy. The interventricular septum is flattened in systole and diastole,  consistent with right ventricular pressure and volume overload. Left ventricular diastolic parameters are consistent with Grade I diastolic dysfunction (impaired relaxation). Indeterminate filling pressures. Right Ventricle: The right ventricular size is normal. No increase in right ventricular wall thickness. Right ventricular systolic function is mildly reduced. There is mildly elevated pulmonary artery systolic pressure. The tricuspid regurgitant velocity  is 2.38 m/s, and with an assumed right atrial pressure of 15 mmHg, the estimated right ventricular systolic pressure is 37.7 mmHg. Left Atrium: Left atrial size was normal in size. Right Atrium: Right atrial size was normal in size. Pericardium: There is no evidence of pericardial effusion. Mitral Valve: The mitral valve is abnormal. Mild mitral annular calcification. Trivial mitral valve regurgitation. Tricuspid Valve: The tricuspid valve is grossly normal. Tricuspid valve regurgitation is mild to moderate. Aortic Valve: The aortic valve is tricuspid. Aortic valve regurgitation is not visualized. Aortic valve sclerosis/calcification is present, without any evidence of aortic stenosis. Aortic valve peak gradient measures 4.2 mmHg. Pulmonic Valve: The pulmonic valve was grossly normal. Pulmonic valve regurgitation is trivial. Aorta: Aortic dilatation noted. There is borderline dilatation of the aortic root, measuring 38 mm. Venous: The inferior vena cava is dilated in size with less than 50% respiratory variability,  suggesting right atrial pressure of 15 mmHg. IAS/Shunts: No atrial level shunt detected by color flow Doppler.  LEFT VENTRICLE PLAX 2D LV EF:         Left            Diastology                ventricular     LV e' medial:   3.57 cm/s                ejection        LV E/e' medial: 11.7                fraction by                PLAX is 25                %. LVIDd:         5.10 cm LVIDs:         4.50 cm LV PW:         1.30 cm LV IVS:        1.20 cm LVOT diam:     2.40 cm LV SV:         41 LV SV Index:   22 LVOT Area:     4.52 cm  RIGHT VENTRICLE            IVC RV S prime:     8.35 cm/s  IVC diam: 2.80 cm TAPSE (M-mode): 1.4 cm LEFT ATRIUM             Index        RIGHT ATRIUM           Index LA diam:        3.90 cm 2.06 cm/m   RA Area:     10.87 cm LA Vol (A2C):   31.9 ml 16.86 ml/m  RA Volume:   23.17 ml  12.25 ml/m LA Vol (A4C):  32.0 ml 16.92 ml/m LA Biplane Vol: 34.8 ml 18.40 ml/m  AORTIC VALVE AV Area (Vmax): 2.94 cm AV Vmax:        103.00 cm/s AV Peak Grad:   4.2 mmHg LVOT Vmax:      66.97 cm/s LVOT Vmean:     39.967 cm/s LVOT VTI:       0.092 m  AORTA Ao Root diam: 3.80 cm Ao Asc diam:  3.10 cm MITRAL VALVE               TRICUSPID VALVE MV Area (PHT): 4.31 cm    TR Peak grad:   22.7 mmHg MV Decel Time: 176 msec    TR Vmax:        238.00 cm/s MV E velocity: 41.70 cm/s MV A velocity: 77.30 cm/s  SHUNTS MV E/A ratio:  0.54        Systemic VTI:  0.09 m                            Systemic Diam: 2.40 cm Zoila Shutter MD Electronically signed by Zoila Shutter MD Signature Date/Time: 05/25/2023/5:16:01 PM    Final    VAS [place] LOWER EXTREMITY VENOUS (DVT) Result Date: 05/25/2023  Lower Venous DVT Study Patient Name:  Bruce [name]  Date of Exam:   05/25/2023 Medical Rec #: [id number]       Accession #:    [id number] Date of Birth: [birth year]       Patient Gender: M Patient Age:   79 years Exam Location:  [facility] Procedure:      VAS [place] LOWER EXTREMITY VENOUS (DVT) Referring Phys: [facility] ALVA  --------------------------------------------------------------------------------  Indications: Swelling, Edema, SOB, pulmonary embolism, and H/O DVT. Other Indications: Current smoker, H/O colon and lung cancer. Comparison Study: Previous study on 3.24.2010. Performing Technologist: Fernande Bras  Examination Guidelines: A complete evaluation includes B-mode imaging, spectral Doppler, color Doppler, and power Doppler as needed of all accessible portions of each vessel. Bilateral testing is considered an integral part of a complete examination. Limited examinations for reoccurring indications may be performed as noted. The reflux portion of the exam is performed with the patient in reverse Trendelenburg.  +---------+---------------+---------+-----------+---------------+--------------+ RIGHT    CompressibilityPhasicitySpontaneityProperties     Thrombus Aging +---------+---------------+---------+-----------+---------------+--------------+ CFV      Partial        Yes      Yes        brightly       Chronic                                                    echogenic                     +---------+---------------+---------+-----------+---------------+--------------+ SFJ      Full           Yes      Yes                                      +---------+---------------+---------+-----------+---------------+--------------+ FV Prox  Partial        Yes      Yes        brightly  Chronic                                                    echogenic                     +---------+---------------+---------+-----------+---------------+--------------+ FV Mid   Partial        Yes      Yes                       Age                                                                       Indeterminate  +---------+---------------+---------+-----------+---------------+--------------+ FV DistalPartial        Yes      Yes                       Age                                                                        Indeterminate  +---------+---------------+---------+-----------+---------------+--------------+ PFV      Partial        Yes      Yes        brightly       Chronic                                                    echogenic                     +---------+---------------+---------+-----------+---------------+--------------+ POP      Partial        No       Yes                       Age                                                                       Indeterminate  +---------+---------------+---------+-----------+---------------+--------------+ PTV      Partial        No       No                        Age  Indeterminate  +---------+---------------+---------+-----------+---------------+--------------+ PERO     Partial        No       No                        Age                                                                       Indeterminate  +---------+---------------+---------+-----------+---------------+--------------+ EIV                     Yes      Yes                                      +---------+---------------+---------+-----------+---------------+--------------+ 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  +---------+---------------+---------+-----------+----------+-----------------+ LEFT     CompressibilityPhasicitySpontaneityPropertiesThrombus Aging    +---------+---------------+---------+-----------+----------+-----------------+ CFV      Partial        Yes      Yes                  Chronic           +---------+---------------+---------+-----------+----------+-----------------+ SFJ      Full           Yes      Yes                                    +---------+---------------+---------+-----------+----------+-----------------+ FV Prox  Full                                                            +---------+---------------+---------+-----------+----------+-----------------+ FV Mid   Partial        Yes      Yes                  Age Indeterminate +---------+---------------+---------+-----------+----------+-----------------+ FV DistalPartial        Yes      Yes                  Age Indeterminate +---------+---------------+---------+-----------+----------+-----------------+ PFV      Partial        Yes      Yes                  Chronic           +---------+---------------+---------+-----------+----------+-----------------+ POP      Partial        Yes      Yes                  Chronic           +---------+---------------+---------+-----------+----------+-----------------+ PTV      Partial        Yes      Yes                  Age Indeterminate +---------+---------------+---------+-----------+----------+-----------------+ PERO  Partial        No       No                   Age Indeterminate +---------+---------------+---------+-----------+----------+-----------------+ SSV      None           No       No                   Acute             +---------+---------------+---------+-----------+----------+-----------------+ EIV                     Yes      Yes                                    +---------+---------------+---------+-----------+----------+-----------------+ 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    Summary: RIGHT: - Findings consistent with age indeterminate deep vein thrombosis involving the right femoral vein, right popliteal vein, right posterior tibial veins, and right peroneal veins.  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  - No cystic structure found in the popliteal fossa.  LEFT: - Findings consistent with acute superficial vein thrombosis involving the left small saphenous vein.  - Findings consistent with age  indeterminate deep vein thrombosis involving the left femoral vein, left popliteal vein, left posterior tibial veins, and left peroneal veins.  - Findings consistent with chronic deep vein thrombosis involving the left common femoral vein, and left proximal profunda vein.  - No cystic structure found in the popliteal fossa.  *See table(s) above for measurements and observations. Electronically signed by Lemar Livings MD on 05/25/2023 at 2:21:30 PM.    Final    DG CHEST PORT 1 VIEW Result Date: 05/25/2023 CLINICAL DATA:  Pleural effusion. EXAM: PORTABLE CHEST 1 VIEW COMPARISON:  May 24, 2023. FINDINGS: Stable cardiomediastinal silhouette. Stable moderate size loculated right pleural effusion is noted with adjacent atelectasis or infiltrate. Left lung is unremarkable. Bony thorax is unremarkable. IMPRESSION: Stable moderate size loculated right pleural effusion with adjacent right basilar atelectasis or infiltrate. Electronically Signed   By: Lupita Raider M.D.   On: 05/25/2023 09:16   CT Angio Chest/Abd/Pel for Dissection W and/or Wo Contrast Result Date: 05/24/2023 CLINICAL DATA:  Acute aortic syndrome suspected. Concern for pulmonary embolism. Shortness of breath. EXAM: CT ANGIOGRAPHY CHEST, ABDOMEN AND PELVIS TECHNIQUE: Non-contrast CT of the chest was initially obtained. Multidetector CT imaging through the chest, abdomen and pelvis was performed using the standard protocol during bolus administration of intravenous contrast. Multiplanar reconstructed images and MIPs were obtained and reviewed to evaluate the vascular anatomy. RADIATION DOSE REDUCTION: This exam was performed according to the departmental dose-optimization program which includes automated exposure control, adjustment of the mA and/or kV according to patient size and/or use of iterative reconstruction technique. CONTRAST:  75mL OMNIPAQUE IOHEXOL 350 MG/ML SOLN COMPARISON:  Chest CT dated 04/09/2022. FINDINGS: Evaluation is limited due to  streak artifact caused by patient's arms and overlying metallic support apparatus. CTA CHEST FINDINGS Cardiovascular: There is no cardiomegaly. Trace pericardial effusion. There is 3 vessel coronary vascular calcification. Moderate atherosclerotic calcification of the thoracic aorta. No aneurysmal dilatation or dissection. The origins of the great vessels of the aortic arch appear patent. Large pulmonary artery embolus involving the right main pulmonary artery  as seen on the prior CT extending into the right middle and right lower lobe branches. Interval resolution of the previously seen left lower lobe pulmonary artery emboli. Small nonocclusive clot in the lingular branch seen Callie decreased since the prior CT. No CT evidence of right heart straining. Mediastinum/Nodes: No hilar or mediastinal adenopathy. The esophagus and the thyroid gland are grossly unremarkable. No mediastinal fluid collection. Lungs/Pleura: Moderate right pleural effusion with partial compressive atelectasis of the right lung. Pneumonia is not excluded. Background of centrilobular emphysema. There are bilateral upper lobe linear atelectasis/scarring. Faint ground-glass nodule in the lingula measures 4 mm. Additional subpleural nodules in the medial left lung base versus areas of scarring measure up to 7 mm. No pneumothorax. The central airways are patent. Musculoskeletal: Degenerative changes of the spine. No acute osseous pathology. Review of the MIP images confirms the above findings. CTA ABDOMEN AND PELVIS FINDINGS VASCULAR Aorta: Moderate atherosclerotic calcification. No aneurysmal dilatation or dissection. No periaortic fluid collection. Celiac: The celiac trunk and its major branches are patent. SMA: Atherosclerotic calcification of the origin of the SMA. The SMA is patent. Renals: The renal arteries are patent. IMA: The IMA is patent. Inflow: Moderate atherosclerotic calcification of the iliac arteries. There is high-grade narrowing  of the right internal iliac artery as seen previously. The external iliac arteries remain patent. Veins: No obvious venous abnormality within the limitations of this arterial phase study. Review of the MIP images confirms the above findings. NON-VASCULAR No intra-abdominal free air or free fluid. Hepatobiliary: The liver is unremarkable.  Cholecystectomy. Pancreas: Unremarkable. No pancreatic ductal dilatation or surrounding inflammatory changes. Spleen: Normal in size without focal abnormality. Adrenals/Urinary Tract: The adrenal glands unremarkable. There is no hydronephrosis on either side. The urinary bladder is grossly unremarkable. Stomach/Bowel: Moderate stool throughout the colon. There is no bowel obstruction. No CT evidence of acute appendicitis. Lymphatic: No adenopathy. Reproductive: Prostate brachytherapy seeds. Other: Diffuse subcutaneous edema.  No fluid collection. Musculoskeletal: Osteopenia with degenerative changes of the spine. No acute osseous pathology. Review of the MIP images confirms the above findings. IMPRESSION: 1. Large pulmonary artery embolus involving the right main pulmonary artery as seen on the prior CT extending into the right middle and right lower lobe branches. Interval resolution of the previously seen left lower lobe pulmonary artery emboli. No CT evidence of right heart straining. 2. Moderate right pleural effusion with partial compressive atelectasis of the right lung. Pneumonia is not excluded. 3. No acute intra-abdominal or pelvic pathology. 4. Aortic Atherosclerosis (ICD10-I70.0) and Emphysema (ICD10-J43.9). Electronically Signed   By: Elgie Collard M.D.   On: 05/24/2023 16:48   DG Chest 2 View Result Date: 05/24/2023 CLINICAL DATA:  Shortness of breath. EXAM: CHEST - 2 VIEW COMPARISON:  April 18, 2023. FINDINGS: The heart size and mediastinal contours are within normal limits. Left lung is clear. Small loculated right pleural effusion is noted with associated  right basilar atelectasis or inflammation. The visualized skeletal structures are unremarkable. IMPRESSION: Small loculated right pleural effusion is noted with associated right basilar atelectasis or inflammation. Electronically Signed   By: Lupita Raider M.D.   On: 05/24/2023 15:39    Microbiology: Results for orders placed or performed during the hospital encounter of 05/24/23  Body fluid culture w Gram Stain     Status: Abnormal   Collection Time: 05/31/23  9:58 AM   Specimen: Lung, Right; Pleural Fluid  Result Value Ref Range Status   Specimen Description PLEURAL  Final   Special Requests  right lung  Final   Gram Stain   Final    MODERATE WBC PRESENT, PREDOMINANTLY PMN RARE GRAM POSITIVE COCCI IN PAIRS Performed at [facility], [address] [place], [place] [address]    Culture STREPTOCOCCUS PNEUMONIAE (A)  Final   Report Status 06/03/2023 FINAL  Final   Organism ID, Bacteria STREPTOCOCCUS PNEUMONIAE  Final      Susceptibility   Streptococcus pneumoniae - MIC*    ERYTHROMYCIN <=0.12 SENSITIVE Sensitive     LEVOFLOXACIN 1 SENSITIVE Sensitive     VANCOMYCIN 0.5 SENSITIVE Sensitive     PENO - penicillin <=0.06      PENICILLIN (non-meningitis) <=0.06 SENSITIVE Sensitive     PENICILLIN (oral) <=0.06 SENSITIVE Sensitive     CEFTRIAXONE (non-meningitis) <=0.12 SENSITIVE Sensitive     * STREPTOCOCCUS PNEUMONIAE   Labs: CBC: Recent Labs  Lab 06/02/23 0256 06/03/23 0744 06/04/23 0821 06/05/23 0652 06/06/23 0617  WBC 11.6* 10.6* 9.5 9.8 10.8*  NEUTROABS 9.7* 8.9* 7.8* 8.1* 9.2*  HGB 11.7* 12.7* 12.2* 12.0* 11.5*  HCT 36.3* 39.5 37.4* 36.8* 35.2*  MCV 93.8 94.0 92.6 94.1 91.9  PLT 361 357 358 336 325   Basic Metabolic Panel: Recent Labs  Lab 06/02/23 0908 06/03/23 0744 06/04/23 0821 06/05/23 0652 06/06/23 0617  NA 136 135 133* 136 135  K 3.8 4.0 4.3 4.4 4.7  CL 99 96* 99 101 105  CO2 26 31 27 27 23   GLUCOSE 136* 103* 144* 96 95  BUN 30* 37* 35* 36* 36*   CREATININE 0.90 0.95 0.93 0.97 0.88  CALCIUM 9.1 9.2 8.9 9.1 8.9   Liver Function Tests: No results for input(s): "AST", "ALT", "ALKPHOS", "BILITOT", "PROT", "ALBUMIN" in the last 168 hours. CBG: No results for input(s): "GLUCAP" in the last 168 hours.  Discharge time spent: greater than 30 minutes.  Author: Lynden Oxford, MD  [facility] 06/06/2023

## 2023-06-07 NOTE — Transitions of Care (Post Inpatient/ED Visit) (Signed)
   06/07/2023  Name: Bruce [name] MRN: [id number] DOB: 05-23-1945  Today's TOC FU Call Status: Today's TOC FU Call Status:: Unsuccessful Call (1st Attempt) Unsuccessful Call (1st Attempt) Date: 06/07/23  Attempted to reach the patient regarding the most recent Inpatient/ED visit.  Follow Up Plan: Additional outreach attempts will be made to reach the patient to complete the Transitions of Care (Post Inpatient/ED visit) call.   Deidre Ala, BSN, RN [place]  [facility]BC[facility] - [employer] [facility] RN Care Manager [phone]

## 2023-06-07 NOTE — Telephone Encounter (Signed)
 Attempted to reach Mr. Trefz since he is now discharged from the hospital to schedule a home follow up- no answer. I left a message. I will see continue to attempt to follow up.   [name], EMT-Paramedic [phone] 06/07/2023

## 2023-06-08 ENCOUNTER — Telehealth: Payer: Self-pay

## 2023-06-08 ENCOUNTER — Telehealth (HOSPITAL_COMMUNITY): Payer: Self-pay | Admitting: Cardiology

## 2023-06-08 DIAGNOSIS — J9620 Acute and chronic respiratory failure, unspecified whether with hypoxia or hypercapnia: Secondary | ICD-10-CM | POA: Diagnosis not present

## 2023-06-08 DIAGNOSIS — R0609 Other forms of dyspnea: Secondary | ICD-10-CM | POA: Diagnosis not present

## 2023-06-08 DIAGNOSIS — J479 Bronchiectasis, uncomplicated: Secondary | ICD-10-CM | POA: Diagnosis not present

## 2023-06-08 DIAGNOSIS — J449 Chronic obstructive pulmonary disease, unspecified: Secondary | ICD-10-CM | POA: Diagnosis not present

## 2023-06-08 NOTE — Telephone Encounter (Addendum)
 [facility] RN called after remote visit to report pt has not taken meds in two days post discharge. Patient report home paramedicine has not been over to assist with med changes.   Chart notes attempt by [facility] 4/3 @ 1611 and was not successful. Message was left on voicemail  Will reach out to covering paramedic to attempt add on visit for 4/4 otherwise will be sure message is left with Heather,EMT

## 2023-06-08 NOTE — Transitions of Care (Post Inpatient/ED Visit) (Signed)
 06/08/2023  Name: Bruce [name] MRN: [id number] DOB: [birth year]  Today's TOC FU Call Status: Today's TOC FU Call Status:: Successful TOC FU Call Completed TOC FU Call Complete Date: 06/08/23 Patient's Name and Date of Birth confirmed.  Transition Care Management Follow-up Telephone Call How have you been since you were released from the hospital?: Better (states that he continues to be short of breath) Any questions or concerns?: No  Items Reviewed: Did you receive and understand the discharge instructions provided?: Yes Medications obtained,verified, and reconciled?: Yes (Medications Reviewed) Any new allergies since your discharge?: No Dietary orders reviewed?: Yes Type of Diet Ordered:: low sodium diet Do you have support at home?: Yes People in Home: friend(s) Name of Support/Comfort Primary Source: Wes  Medications Reviewed Today: Medications Reviewed Today   Medications were not reviewed in this encounter     Home Care and Equipment/Supplies: Were Home Health Services Ordered?:  (? paramedicine program Heather) Any new equipment or medical supplies ordered?: No  Functional Questionnaire: Do you need assistance with bathing/showering or dressing?: No Do you need assistance with meal preparation?: No Do you need assistance with eating?: No Do you have difficulty maintaining continence: No Do you need assistance with getting out of bed/getting out of a chair/moving?: No Do you have difficulty managing or taking your medications?: Yes (Heather paramedicine)  Follow up appointments reviewed: PCP Follow-up appointment confirmed?: Yes Date of PCP follow-up appointment?: 06/14/23 Follow-up Provider: PCP Specialist Hospital Follow-up appointment confirmed?: Yes Date of Specialist follow-up appointment?: 06/21/23 Follow-Up Specialty Provider:: [facility] Do you need transportation to your follow-up appointment?: No Do you understand care options if your condition(s) worsen?:  Yes-patient verbalized understanding  SDOH Interventions Today    Flowsheet Row Most Recent Value  SDOH Interventions   Food Insecurity Interventions Patient Declined  [declines referral, encouraged patient to call [employer] about food benefits.]  Housing Interventions Patient Declined  [reports that he is working with a social now and denies any additional help needed]  Transportation Interventions Intervention Not Indicated  Utilities Interventions Intervention Not Indicated      Interventions Today    Flowsheet Row Most Recent Value  Chronic Disease   Chronic disease during today's visit Congestive Heart Failure (CHF)  General Interventions   General Interventions Discussed/Reviewed General Interventions Discussed, General Interventions Reviewed, Doctor Visits  Doctor Visits Discussed/Reviewed Doctor Visits Discussed, Doctor Visits Reviewed, PCP, Specialist  PCP/Specialist Visits Compliance with follow-up visit  Education Interventions   Education Provided Provided Education  Provided Verbal Education On Nutrition, Medication, Insurance Plans  [Reviewed with pateint the importance of follow up and taking his medications as prescribed.]  Nutrition Interventions   Nutrition Discussed/Reviewed Nutrition Discussed, Decreasing salt  Pharmacy Interventions   Pharmacy Dicussed/Reviewed Referral to Pharmacist  [Placed call to paramedicine to fill patients RX.]  Referral to Pharmacist --  [unable to read]      TOC Interventions Today    Flowsheet Row Most Recent Value  TOC Interventions   TOC Interventions Discussed/Reviewed TOC Interventions Discussed, TOC Interventions Reviewed, Post op wound/incision care, S/S of infection       Spoke with patient who states that he can not read and does not know how to take his medications. Reports that he is active with [name] with the para medicine program.  Patient reports that he is not able to get in touch with [facility].  I placed call  to Herbert Seta myself and left a message. Placed call to Chantel  at the heart failure clinic  and she reports she will get in touch with someone.  Reviewed with patient his concerns for SDOH and patient declines assistance- reports that he is already working with a Child psychotherapist.  Placed call to [facility] RN CM and reviewed case. Reviewed concern for patient not taking his medications. Encouraged patient to take his medications as prescribed.  Confirmed with patient that he has his follow up appointments and transportation. Thayer Ohm Homscher will call patient next week for a follow up.  Lonia Chimera, RN, BSN, CEN [employer]- Transition of Care Team.  [facility] [phone]

## 2023-06-11 ENCOUNTER — Other Ambulatory Visit: Payer: Self-pay

## 2023-06-11 ENCOUNTER — Other Ambulatory Visit (HOSPITAL_COMMUNITY): Payer: Self-pay

## 2023-06-11 NOTE — Transitions of Care (Post Inpatient/ED Visit) (Signed)
 06/11/2023  Name: Bruce [name] MRN: [id number] DOB: [birth year]  Today's TOC FU Call Status: Today's TOC FU Call Status:: Successful TOC FU Call Completed Patient's Name and Date of Birth confirmed.  Transition Care Management Follow-up Telephone Call Date of Discharge: 06/06/23 Discharge Facility: [facility]) Type of Discharge: Inpatient Admission Primary Inpatient Discharge Diagnosis:: Pulmonary Embolism How have you been since you were released from the hospital?: Same Any questions or concerns?: Yes Patient Questions/Concerns:: Has not had his medications since April 2nd because he doesn't read well and hasn't been able to fix his pill box Patient Questions/Concerns Addressed: Notified Provider of Patient Questions/Concerns  Items Reviewed: Did you receive and understand the discharge instructions provided?: No (Does not understand his medications and wound care) Medications obtained,verified, and reconciled?: No Medications Not Reviewed Reasons:: Notified Provider of Medication Management Concern Any new allergies since your discharge?: No Dietary orders reviewed?: Yes Type of Diet Ordered:: Low sodium Do you have support at home?: No People in Home [RPT]: friend(s) Name of Support/Comfort Primary Source: Lupita Leash Paschall  Medications Reviewed Today: Medications Reviewed Today   Medications were not reviewed in this encounter     Home Care and Equipment/Supplies: Were Home Health Services Ordered?: NA Any new equipment or medical supplies ordered?: NA  Functional Questionnaire: Do you need assistance with bathing/showering or dressing?: No Do you need assistance with meal preparation?: No Do you need assistance with eating?: No Do you have difficulty maintaining continence: No Do you need assistance with getting out of bed/getting out of a chair/moving?: No (uses a cane) Do you have difficulty managing or taking your medications?: Yes (Requires the assistance of  the Paramedicine provider)  Follow up appointments reviewed: PCP Follow-up appointment confirmed?: Yes Date of PCP follow-up appointment?: 06/14/23 Follow-up Provider: Grayling Congress Specialist [facility] Follow-up appointment confirmed?: Yes Date of Specialist follow-up appointment?: 06/21/23 Follow-Up Specialty Provider:: [facility] Do you need transportation to your follow-up appointment?: No Do you understand care options if your condition(s) worsen?: Yes-patient verbalized understanding (I am not going back to the hospital)    Goals Addressed             This Visit's Progress    VBCI Outreach   Not on track    Current Barriers: Updated 06/11/23 Knowledge Deficits related to plan of care for management of CHF  Care Coordination needs related to Limited social support, Medication procurement, and Limited education about Heart Failure and the need for medications Chronic Disease Management support and education needs related to CHF  Lacks caregiver support Non-adherence to prescribed medication regimen Lack of follow through on recommendations and education  RNCM Clinical Goal(s):   Updated 06/11/23 Patient will work with the Care Management team over the next 30 days to address Transition of Care Barriers: Medication access Medication Management Diet/Nutrition/Food Resources Provider appointments Functional/Safety verbalize understanding of plan for management of CHF and COPD as evidenced by following the discharge instructions and by taking your medications demonstrate understanding of rationale for each prescribed medication as evidenced by taking the medication as directed  Work through collaboration with Medical illustrator, provider, including the Paramedicine team that comes by weekly Wear oxygen 3l/min continuous and increase to 4l/min if SOB with exertion Stop smoking  Interventions: updated 06/11/23 Evaluation of current treatment plan related to  self management and patient's  adherence to plan as established by provider   Heart Failure Interventions:  ( Goal on track:  NO.) Short Term Goal Updated 06/11/23 Basic overview and  discussion of pathophysiology of Heart Failure reviewed Provided education on low sodium diet Advised patient to weigh each morning after emptying bladder Discussed importance of daily weight and advised patient to weigh and record daily Reviewed role of diuretics in prevention of fluid overload and management of heart failure; Discussed the importance of keeping all appointments with provider The patient does not weigh because he cannot see the scales. His Paramedicine provider will check for CHF weekly  and will weigh him Additional assistance for compliance and medication management with Paramedicine Adding compression garments to the regime daily  Patient Goals/Self-Care Activities:  Updated 06/11/23 Participate in Transition of Care Program/Attend [facility] scheduled calls Notify RN Care Manager of [facility] call rescheduling needs Take all medications as prescribed Attend all scheduled provider appointments Call pharmacy for medication refills 3-7 days in advance of running out of medications Wear compression stockings daily  Follow Up Plan:  Telephone follow up appointment with care management team member scheduled for:  Friday April 11th at  2:15pm          The patient discharged from the hospital on Wednesday April 2nd. He declined home health services because his roommate is an alcoholic and the patient states he is unsure what type of psychological and if he will be wearing clothes and he doesn't want to expose people to that. He has not had medication because he doesn't understand it and he can't see the labels to read it. He has called  multiple people to come and help him without success. Phone calls have been placed with his Paramedicine clinician Herbert Seta and the HF clinic without return phone calls. The patient is SOB at rest and with  exertion. His current weight is 146lbs but he does not weigh daily. His Oxygen is at 3l/min and he will increase to 4l/min with walking. He has been out of the house to get some food and visit with a friend. Message sent to PCP today regarding current health status.  Routine follow-up and on-going assessment evaluation and education of disease processes, and recommended interventions for both chronic and acute medical conditions, will occur during each weekly visit during [facility] 30-day Program Outreach calls along with ongoing review of symptoms, medication reviews and reconciliation. Any updates, inconsistencies, discrepancies or acute care concerns will be addressed on the Care Plan and routed to the correct Practitioner if indicated.    The patient has been provided with contact information for the care management team and has been advised to call with any health-related questions or concerns. The patient verbalized understanding with current POC. The patient is directed to their insurance card regarding availability of benefits coverage.   Deidre Ala, BSN, RN [place]  VBCI - [employer] [facility] RN Care Manager [phone]

## 2023-06-11 NOTE — Telephone Encounter (Signed)
 Chris Case Manager left VM on triage line with reports that patient has still not taken any medications  Paramedicine not in touch with patient Appreciates assistance    Confirmed with Wendy Poet is off 4/7 Katie to reach out to patient to arrange a home visit to assist with meds

## 2023-06-11 NOTE — Progress Notes (Unsigned)
 Paramedicine Encounter    Patient ID: Bruce [name], male    DOB: Sep 30, 1945, 78 y.o.   MRN: [id number]   Complaints-increased sob w/exertion   Edema-slight  Compliance with meds-no meds since d/c   Pill box filled-yes X 2 wks  If so, by whom-paramedic   Refills needed-  Ranexa??  Pt recently home from hosp. Herbert Seta tried to reach him last week but he did not answer.  Herbert Seta was off today but clinic staff got in touch with me ref home visit.  I was able to go see him today.  When he came to the door he had his [place] 02 on him face but when we got back to his bedroom his concentrator was not on.  I turned it on, his initial 02 sat was in the low 80s. After a few min of the 02 actually being on his sats came on up.  Ash tray with about 5-7 cig butts were sitting on top of the concentrator. I warned him to not be smoking while wearing the 02 and he said those weren't his, that his roommate comes all the way back to him room to sit and smoke.  He reports not taking any meds b/c he was fearful he was going to take them wrong.  I reviewed d/c notes and changes. He had one pill box full already, not sure the time from that was filled to him going to hosp to see how compliant he was prior to admission.  Med changes were made in that pill box and also made another pill box since heather will be off next Monday as well and not sure of her schedule but to be sure he has enough meds.  He reports feeling alright but he said the first 3 days being home he felt really run down and couldn't do anything. We discussed the [facility] and PT referral he declined, I advised him that after an admission like he had the more people that can come see him in the home the better it is for him and he was open to the [facility] referral again. Hopefully this can be done at his next clinic appoint this week. I reminded him of that and wrote it in his calendar. 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   Meds  verified and pill boxes for 2 wks refilled.  Only question on med I had was his ranexa-it shows d/c in his search but not mentioned in his d/c summary to stop taking but then its not on his med list. I will relay this info to heather and she will ask provider on Thursday when he goes.   Coarse lung sounds upper lobes  Very slight edema to lower legs.     BP 110/78   Pulse 100   Resp 20   SpO2 95%  Weight yesterday-? Last visit weight-160 @ clinic   Patient Care Team: Miki Kins, FNP as PCP - General (Family Medicine) Laurier Nancy, MD as PCP - Cardiology (Cardiology) [facility] Gainer, RN as [facility] Care Management  Patient Active Problem List   Diagnosis Date Noted   NICM (nonischemic cardiomyopathy) (HCC) 05/26/2023   Pulmonary arterial hypertension (HCC) 05/26/2023   Noncompliance 05/26/2023   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   Pleural effusion, right 05/24/2023   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   Pulmonary embolism on right (HCC) 05/24/2023   Neuropathy 05/19/2023   Acute on chronic respiratory  failure with hypoxia (HCC) 04/19/2023   Acute decompensated heart failure (HCC) 04/18/2023   Prolonged Q-T interval on ECG 04/18/2023   Influenza A 04/18/2023   Cor pulmonale (chronic) (HCC) 04/18/2023   Intermittent claudication (HCC) 12/18/2022   B12 deficiency 12/18/2022   MDD (major depressive disorder), recurrent episode, moderate (HCC) 09/01/2022   Pulmonary hypertension, unspecified (HCC) 04/14/2022   Acute on chronic combined systolic and diastolic CHF (congestive heart failure) (HCC) 04/13/2022   Pulmonary embolism (HCC) 04/09/2022   PAF (paroxysmal atrial fibrillation) (HCC)    NSTEMI (non-ST elevated myocardial infarction) (HCC) 10/10/2018   Syncope 04/28/2018   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   Limb ischemia 11/18/2013   Acute blood loss anemia 10/08/2013   Anxiety disorder 10/08/2013   Dyslipidemia 10/08/2013    GERD (gastroesophageal reflux disease) 10/08/2013   Anticoagulated 10/08/2013   Hypertension    CAD (coronary artery disease) 08/16/2010   COPD (chronic obstructive pulmonary disease) (HCC) 08/16/2010   DVT of leg (deep venous thrombosis) (HCC) 08/16/2010    Current Outpatient Medications:    acetaminophen (TYLENOL) 500 MG tablet, Take 2 tablets (1,000 mg total) by mouth every 6 (six) hours as needed for mild pain (pain score 1-3), moderate pain (pain score 4-6), fever or headache., Disp: 30 tablet, Rfl: 0   albuterol (VENTOLIN HFA) 108 (90 Base) MCG/ACT inhaler, TAKE TWO PUFFS BY MOUTH AS NEEDED FOR WHEEZING EVERY 4-6 HOURS, Disp: 8.5 g, Rfl: 5   ALPRAZolam (XANAX) 0.5 MG tablet, Take 1 tablet (0.5 mg total) by mouth 2 (two) times daily as needed for anxiety., Disp: 60 tablet, Rfl: 1   amoxicillin-clavulanate (AUGMENTIN) 875-125 MG tablet, Take 1 tablet by mouth 2 (two) times daily for 22 days., Disp: 44 tablet, Rfl: 0   apixaban (ELIQUIS) 5 MG TABS tablet, TAKE ONE TABLET (5 MG TOTAL) BY MOUTH TWO (TWO) TIMES DAILY., Disp: 60 tablet, Rfl: 11   budesonide (PULMICORT) 0.25 MG/2ML nebulizer solution, Take 2 mLs (0.25 mg total) by nebulization 2 (two) times daily., Disp: 60 mL, Rfl: 12   carvedilol (COREG) 3.125 MG tablet, Take 1 tablet (3.125 mg total) by mouth 2 (two) times daily with a meal., Disp: 60 tablet, Rfl: 0   cetirizine (ZYRTEC) 10 MG tablet, Take 1 tablet (10 mg total) by mouth daily., Disp: 30 tablet, Rfl: 11   cholecalciferol (VITAMIN D3) 25 MCG (1000 UNIT) tablet, Take 1,000 Units by mouth in the morning., Disp: , Rfl:    FARXIGA 10 MG TABS tablet, TAKE ONE TABLET (10 MG TOTAL) BY MOUTH DAILY., Disp: 90 tablet, Rfl: 1   feeding supplement (ENSURE ENLIVE / ENSURE PLUS) LIQD, Take 237 mLs by mouth 2 (two) times daily between meals., Disp: 10000 mL, Rfl: 0   fluticasone (FLONASE) 50 MCG/ACT nasal spray, Place 1 spray into both nostrils daily as needed for allergies., Disp: , Rfl:     furosemide (LASIX) 20 MG tablet, Take 2 tablets (40 mg total) by mouth daily., Disp: 60 tablet, Rfl: 11   gabapentin (NEURONTIN) 100 MG capsule, TAKE ONE CAPSULE (100 MG TOTAL) BY MOUTH THREE (THREE) TIMES DAILY., Disp: 90 capsule, Rfl: 1   Multiple Vitamin (MULTIVITAMIN) tablet, Take 1 tablet by mouth in the morning. For Men, Disp: , Rfl:    Omega-3 Fatty Acids (FISH OIL PO), Take 1,000 mg by mouth every evening., Disp: , Rfl:    omeprazole (PRILOSEC) 40 MG capsule, Take 1 capsule (40 mg total) by mouth 2 (two) times daily., Disp: 180 capsule, Rfl: 1   OXYGEN,  Inhale 3 L/min into the lungs continuous., Disp: , Rfl:    oxymetazoline (AFRIN) 0.05 % nasal spray, Place 1 spray into both nostrils 2 (two) times daily as needed (nose bleed)., Disp: 30 mL, Rfl: 0   rOPINIRole (REQUIP) 1 MG tablet, TAKE ONE TABLET (1 MG TOTAL) BY MOUTH IN THE MORNING AND AT BEDTIME., Disp: 60 tablet, Rfl: 3   rosuvastatin (CRESTOR) 20 MG tablet, Take 1 tablet (20 mg total) by mouth daily., Disp: 30 tablet, Rfl: 2   sildenafil (REVATIO) 20 MG tablet, Take 1 tablet (20 mg total) by mouth 3 (three) times daily., Disp: 90 tablet, Rfl: 5   Elastic Bandages & Supports (MEDICAL COMPRESSION STOCKINGS) MISC, Wear daily as needed, Disp: 2 each, Rfl: 0   lidocaine (LIDODERM) 5 %, Place 1 patch onto the skin daily. Remove & Discard patch within 12 hours or as directed by MD, Disp: 30 patch, Rfl: 11   Tiotropium Bromide-Olodaterol (STIOLTO RESPIMAT) 2.5-2.5 MCG/ACT AERS, Inhale 2 puffs into the lungs daily., Disp: 4 g, Rfl: 12   Wound Cleansers (VASHE WOUND) 0.033 % SOLN, Apply 1 Act topically daily at 12 noon., Disp: 1000 mL, Rfl: 0 Allergies  Allergen Reactions   Adhesive [Tape] Other (See Comments)    After right leg fracture surgery, pt developed a large blister where tape was applied to his right leg. OK to use paper tape.   Eggs-Apples-Oats [Alitraq] Other (See Comments)    Caused chest pains   Imdur [Isosorbide Dinitrate]  Other (See Comments)    hallucinations   Singulair [Montelukast Sodium] Other (See Comments)    Hallucinations       Social History   Socioeconomic History   Marital status: Single    Spouse name: Not on file   Number of children: 2   Years of education: Not on file   Highest education level: 6th grade  Occupational History   Not on file  Tobacco Use   Smoking status: Former    Current packs/day: 0.00    Average packs/day: 0.5 packs/day for 52.0 years (26.0 ttl pk-yrs)    Types: Cigarettes    Start date: 07/05/1970    Quit date: 07/05/2022    Years since quitting: 0.9   Smokeless tobacco: Never  Vaping Use   Vaping status: Never Used  Substance and Sexual Activity   Alcohol use: No    Alcohol/week: 0.0 standard drinks of alcohol    Comment: Stopped 2009   Drug use: No   Sexual activity: Not Currently  Other Topics Concern   Not on file  Social History Narrative   ** Merged History Encounter **       Social Drivers of Health   Financial Resource Strain: High Risk (04/12/2022)   Overall Financial Resource Strain (CARDIA)    Difficulty of Paying Living Expenses: Hard  Food Insecurity: Food Insecurity Present (06/08/2023)   Hunger Vital Sign    Worried About Running Out of Food in the Last Year: Sometimes true    Ran Out of Food in the Last Year: Sometimes true  Transportation Needs: No Transportation Needs (06/08/2023)   PRAPARE - Administrator, Civil Service (Medical): No    Lack of Transportation (Non-Medical): No  Recent Concern: Transportation Needs - Unmet Transportation Needs (05/25/2023)   PRAPARE - Administrator, Civil Service (Medical): Yes    Lack of Transportation (Non-Medical): No  Physical Activity: Inactive (02/05/2017)   Exercise Vital Sign    Days of Exercise  per Week: 0 days    Minutes of Exercise per Session: 0 min  Stress: Stress Concern Present (02/05/2017)   [employer] of Occupational Health - Occupational Stress  Questionnaire    Feeling of Stress : Rather much  Social Connections: Unknown (05/25/2023)   Social Connection and Isolation Panel [NHANES]    Frequency of Communication with Friends and Family: Twice a week    Frequency of Social Gatherings with Friends and Family: Once a week    Attends Religious Services: Patient declined    Database administrator or Organizations: No    Attends Banker Meetings: Never    Marital Status: Divorced  Catering manager Violence: Not At Risk (06/08/2023)   Humiliation, Afraid, Rape, and Kick questionnaire    Fear of Current or Ex-Partner: No    Emotionally Abused: No    Physically Abused: No    Sexually Abused: No    Physical Exam      Future Appointments  Date Time Provider Department Center  06/14/2023  2:00 PM [facility] PA/NP [facility] None  06/15/2023  2:15 PM Homsher, Wynona Canes, RN [facility] None  06/21/2023 10:30 AM Odette Fraction, MD [facility]       Kerry Hough, Paramedic [phone] [facility] Paramedic  06/12/23

## 2023-06-12 ENCOUNTER — Telehealth (HOSPITAL_COMMUNITY): Payer: Self-pay

## 2023-06-12 NOTE — Telephone Encounter (Signed)
-  Returned called to VBCI RN's and left a VM to make them aware of the follow up by Kerry Hough yesterday for a successful paramedicine outreach.   I spoke to Bruce [name] and he reports he is doing well and is feeling food. He says he had a good visit with Paramedic Katie yesterday. He has his medications and was reminded to be taking them as prescribed. He verbalized understanding. I plan to see him in clinic on Thursday at 2:00 and he was reminded to bring his medications and pill boxes. He understands same. Call complete.   [name], EMT-Paramedic [phone] 06/12/2023

## 2023-06-12 NOTE — Progress Notes (Incomplete)
 ADVANCED HF CLINIC NOTE  PCP: [name] NP Radiation Oncology: Dr Rushie Chestnut HF Cardiologist: Dr Gala Romney    HPI: Bruce [name] is a 78 y.o. with COPD, ongoing tobacco use, CAD, HFrEF, colon CA previous lung CA, prostate cancer, PE, DVT and noncompliance.   Admitted 04/09/22 with submassive PE requiring thrombectomy but clot noted to be A/C (concern for CTEPH). Missed several doses of Xarelto prior to admit. Also had a/c LE DVT.  EF found to be down to 20-25%. RV severely reduced. Cath minimal CAD, moderate to severe PAH with evidence of cor pulmonale (LPAP > RPAP - checked multiple times). Discharged 04/18/22 to SNF. Discharge to home 05/05/22.   Dr. Gala Romney saw him in 4/24 and POCUS showed severe RV dysfunction and LVEF 40% (back to baseline). Suspicion was for severe underlying PAH/cor pulmonale due to end-stage COPD. PFTs and VQ ordered  VQ 5/24: Multiple peripheral perfusion defects in both lungs, indeterminate for recurrent acute pulmonary embolism -> CT scan recommended.  CT chest 5/24: Severe COPD. The component of thrombus in the right main PA has essentially resolved since the prior CTA with a small amount of anterior chronic mural thrombus remaining. Distally, occlusive chronic thrombus remains in the interlobar PA extending into RLL and RML pulmonary arteries. Does not have the appearance of acute thrombus and appears to be chronic obstructive thrombus. At the time of thrombectomy, there was clearly a component of significant chronic thrombus in the interlobar artery  Echo 01/15/23: EF 40-45%, flattened septum with RV failure  Readmitted in 02/25 with acute on chronic respiratory failure and volume overload. He was + for flu A. Diuresis limited by hypotension.  Admitted 3/25: ***  Today he returns for post hospital follow up. Overall feeling ***. Denies palpitations, CP, dizziness, edema, or PND/Orthopnea. *** SOB. Appetite ok. No fever or chills. Weight at home *** pounds. Taking  all medications. Denies ETOH, tobacco or drug use.   ROS: All systems negative except as listed in HPI, PMH and Problem List.  SH:  Social History   Socioeconomic History   Marital status: Single    Spouse name: Not on file   Number of children: 2   Years of education: Not on file   Highest education level: 6th grade  Occupational History   Not on file  Tobacco Use   Smoking status: Former    Current packs/day: 0.00    Average packs/day: 0.5 packs/day for 52.0 years (26.0 ttl pk-yrs)    Types: Cigarettes    Start date: 07/05/1970    Quit date: 07/05/2022    Years since quitting: 0.9   Smokeless tobacco: Never  Vaping Use   Vaping status: Never Used  Substance and Sexual Activity   Alcohol use: No    Alcohol/week: 0.0 standard drinks of alcohol    Comment: Stopped 2009   Drug use: No   Sexual activity: Not Currently  Other Topics Concern   Not on file  Social History Narrative   ** Merged History Encounter **       Social Drivers of Health   Financial Resource Strain: High Risk (04/12/2022)   Overall Financial Resource Strain (CARDIA)    Difficulty of Paying Living Expenses: Hard  Food Insecurity: Food Insecurity Present (06/08/2023)   Hunger Vital Sign    Worried About Running Out of Food in the Last Year: Sometimes true    Ran Out of Food in the Last Year: Sometimes true  Transportation Needs: No Transportation Needs (06/08/2023)  PRAPARE - Administrator, Civil Service (Medical): No    Lack of Transportation (Non-Medical): No  Recent Concern: Transportation Needs - Unmet Transportation Needs (05/25/2023)   PRAPARE - Administrator, Civil Service (Medical): Yes    Lack of Transportation (Non-Medical): No  Physical Activity: Inactive (02/05/2017)   Exercise Vital Sign    Days of Exercise per Week: 0 days    Minutes of Exercise per Session: 0 min  Stress: Stress Concern Present (02/05/2017)   [employer] of Occupational Health - Occupational  Stress Questionnaire    Feeling of Stress : Rather much  Social Connections: Unknown (05/25/2023)   Social Connection and Isolation Panel [NHANES]    Frequency of Communication with Friends and Family: Twice a week    Frequency of Social Gatherings with Friends and Family: Once a week    Attends Religious Services: Patient declined    Database administrator or Organizations: No    Attends Banker Meetings: Never    Marital Status: Divorced  Catering manager Violence: Not At Risk (06/08/2023)   Humiliation, Afraid, Rape, and Kick questionnaire    Fear of Current or Ex-Partner: No    Emotionally Abused: No    Physically Abused: No    Sexually Abused: No    FH:  Family History  Problem Relation Age of Onset   Hypertension Mother    Prostate cancer Brother    Mental illness Neg Hx     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 01/01/2015   Complication of anesthesia    wake up with a head ache   COPD (chronic obstructive pulmonary disease) (HCC)    Coronary artery disease    Edema 08/16/2010   GERD (gastroesophageal reflux disease)    Headache    related to sinus congestion   History of blood transfusion    History of kidney stones    History of MI (myocardial infarction) 10/08/2013   Hypertension    Hypokalemia 04/09/2022   Intracranial bleed (HCC) 12/31/2018   Lactic acidosis 04/09/2022   Leg cramps 08/16/2010   Malignant neoplasm of prostate (HCC) 07/13/2016   Multiple closed facial bone fractures (HCC)    Myocardial infarction [facility])    '09 AND  '12   Peripheral vascular disease (HCC)    Shortness of breath    With exertion .   Tibia/fibula fracture 10/06/2013   Tibial plateau fracture 12/29/2014   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HCC) 10/08/2013   UTI (urinary tract infection)    frequent UTI   Visual hallucination 03/18/2015    Current Outpatient Medications  Medication Sig Dispense Refill   acetaminophen (TYLENOL) 500 MG tablet Take 2 tablets (1,000 mg total) by mouth every 6 (six) hours as needed for mild pain (pain score 1-3), moderate pain (pain score 4-6), fever or headache. 30 tablet 0   albuterol (VENTOLIN HFA) 108 (90 Base) MCG/ACT inhaler TAKE TWO PUFFS BY MOUTH AS NEEDED FOR WHEEZING EVERY 4-6 HOURS 8.5 g  5   ALPRAZolam (XANAX) 0.5 MG tablet Take 1 tablet (0.5 mg total) by mouth 2 (two) times daily as needed for anxiety. 60 tablet 1   amoxicillin-clavulanate (AUGMENTIN) 875-125 MG tablet Take 1 tablet by mouth 2 (two) times daily for 22 days. 44 tablet 0   apixaban (ELIQUIS) 5 MG TABS tablet TAKE ONE TABLET (5 MG TOTAL) BY MOUTH TWO (TWO) TIMES DAILY. 60 tablet 11   budesonide (PULMICORT) 0.25 MG/2ML nebulizer solution Take 2 mLs (0.25 mg total) by nebulization 2 (two) times daily. 60 mL 12   carvedilol (COREG) 3.125 MG tablet Take 1 tablet (3.125 mg total) by mouth 2 (two) times daily with a meal. 60 tablet 0   cetirizine (ZYRTEC) 10 MG tablet Take 1 tablet (10 mg total) by mouth daily. 30 tablet 11   cholecalciferol (VITAMIN D3) 25 MCG (1000 UNIT) tablet Take 1,000 Units by mouth in the morning.     Elastic Bandages & Supports (MEDICAL COMPRESSION STOCKINGS) MISC Wear daily as needed 2 each 0   FARXIGA 10 MG TABS tablet TAKE ONE TABLET (10 MG TOTAL) BY MOUTH DAILY. 90 tablet 1   feeding supplement (ENSURE ENLIVE / ENSURE PLUS) LIQD Take 237 mLs by mouth 2 (two) times daily between meals. 10000 mL 0   fluticasone (FLONASE) 50 MCG/ACT nasal spray Place 1 spray into both nostrils daily as needed for allergies.      furosemide (LASIX) 20 MG tablet Take 2 tablets (40 mg total) by mouth daily. 60 tablet 11   gabapentin (NEURONTIN) 100 MG capsule TAKE ONE CAPSULE (100 MG TOTAL) BY MOUTH THREE (THREE) TIMES DAILY. 90 capsule 1   lidocaine (LIDODERM) 5 % Place 1 patch onto the skin daily. Remove & Discard patch within 12 hours or as directed by MD 30 patch 11   Multiple Vitamin (MULTIVITAMIN) tablet Take 1 tablet by mouth in the morning. For Men     Omega-3 Fatty Acids (FISH OIL PO) Take 1,000 mg by mouth every evening.     omeprazole (PRILOSEC) 40 MG capsule Take 1 capsule (40 mg total) by mouth 2 (two) times daily. 180 capsule 1   OXYGEN Inhale 3 L/min into the lungs continuous.     oxymetazoline (AFRIN) 0.05 % nasal spray Place 1 spray into both nostrils 2 (two) times daily as needed (nose bleed). 30 mL 0   rOPINIRole (REQUIP) 1 MG tablet TAKE ONE TABLET (1 MG TOTAL) BY MOUTH IN THE MORNING AND AT BEDTIME. 60 tablet 3   rosuvastatin (CRESTOR) 20 MG tablet Take 1 tablet (20 mg total) by mouth daily. 30 tablet 2   sildenafil (REVATIO) 20 MG tablet Take 1 tablet (20 mg total) by mouth 3 (three) times daily. 90 tablet 5   Tiotropium Bromide-Olodaterol (STIOLTO RESPIMAT) 2.5-2.5 MCG/ACT AERS Inhale 2 puffs into the lungs daily. 4 g 12   Wound Cleansers (VASHE WOUND) 0.033 % SOLN Apply 1 Act topically daily at 12 noon. 1000 mL 0   No current facility-administered medications for this visit.   There were no vitals taken for this visit.  Wt Readings from Last 3 Encounters:  06/11/23 66.2 kg (146 lb)  06/06/23 66.6 kg (146 lb 13.2 oz)  05/24/23 72.8 kg (160 lb 9.6 oz)   PHYSICAL EXAM: General:  *** appearing.  No respiratory difficulty HEENT: normal Neck: supple. JVD *** cm.  Cor: PMI nondisplaced. Regular rate & rhythm. No rubs, gallops or murmurs. Lungs: clear Abdomen: soft, nontender, nondistended. Good bowel sounds. Extremities: no cyanosis,  clubbing, rash, edema  Neuro: alert & oriented x 3. Moves all 4  extremities w/o difficulty. Affect pleasant.    ASSESSMENT & PLAN: 1. Chronic Hypoxic Respiratory Failure - COPD at b/l + submassive PE. Ongoing tobacco use. - Chronically on 3-4L O2 [place], sat 82% on home visit yesterday at 3.5L. 85% in clinic today.  - Dyspneic at rest with use of accessory muscles.  - Likely at least in part d/t volume overload. He has been noncompliant with medications, paramedicine indicates he has been throwing them in the trash *** - Sent to ED for further workup/management  2. 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  - VQ 5/24: Multiple peripheral perfusion defects in both lungs, indeterminate for recurrent acute pulmonary embolism -> CT scan recommended - CT chest 5/24: Severe COPD. The component of thrombus in the right main PA has essentially resolved since the prior CTA with a small amount of anterior chronic mural thrombus remaining. Distally, occlusive chronic thrombus remains in the interlobar PA extending into RLL and RML pulmonary arteries - No role for thrombectomy - Prescribed sildenafil 20 TID. Noncompliant. - Continue Eliquis 5 bid, not compliant   3. PAF  - NSR on ECG today - Continue amio 100 mg daily.  Not ideal long-term with COPD.  - TSH consistently around 6, last checked 12/24. Due for repeat labs. - Continue Eliquis 5 mg bid.   4. Chronic Biventricular HFrEF  - Echo (2/24): EF down 20-25% with D shaped septum RV Failure, EF previously 40-45%.  - R/LHC (2/24): c/w severe NICM. Minimal CAD, moderate to severe PAH with evidence of cor pulmonale - POCUS (06/21/22): EF 40%, RV remains dilated and reduced.  - Echo 11/24: EF 40-45%, RV moderately reduced, septum flattened - NYHA IV. Volume overloaded. Not compliant with diuretic or other medications. *** Was not taking any meds after  discharge. - Continue Farxiga 10 mg daily. - Off losartan d/t hypotension - Continue digoxin 0.125 mg daily. - Off spiro due to elevated K.  - Continue with Paramedicine. Appreciate assistance. - Continue to follow with [facility]  - Appears end-stage.   5. Chest pain - Constant since sneezing episode yesterday - Symptoms atypical for angina. Etiology not clear. - I did not see any ischemic changes on ECG but interpretation limited by artifact - Sent to ED for evaluation d/t progressive dyspnea and hypoxia  6. Tobacco abuse - Smoking cessation discussed  7. Prostate Cancer - Treated with seeds   Follow-up ***  Alen Bleacher AGACNP-BC  3:14 PM

## 2023-06-14 ENCOUNTER — Ambulatory Visit (HOSPITAL_COMMUNITY)
Admit: 2023-06-14 | Discharge: 2023-06-14 | Disposition: A | Discharge status: Home / Self Care | Source: Ambulatory Visit | Attending: Internal Medicine | Admitting: Internal Medicine

## 2023-06-14 ENCOUNTER — Other Ambulatory Visit (HOSPITAL_COMMUNITY): Payer: Self-pay

## 2023-06-14 ENCOUNTER — Encounter (HOSPITAL_COMMUNITY): Payer: Self-pay

## 2023-06-14 VITALS — BP 96/58 | HR 83 | Ht 73.0 in | Wt 142.0 lb

## 2023-06-14 DIAGNOSIS — F419 Anxiety disorder, unspecified: Secondary | ICD-10-CM | POA: Insufficient documentation

## 2023-06-14 DIAGNOSIS — I272 Pulmonary hypertension, unspecified: Secondary | ICD-10-CM | POA: Diagnosis not present

## 2023-06-14 DIAGNOSIS — C61 Malignant neoplasm of prostate: Secondary | ICD-10-CM

## 2023-06-14 DIAGNOSIS — Z5986 Financial insecurity: Secondary | ICD-10-CM | POA: Insufficient documentation

## 2023-06-14 DIAGNOSIS — Z7984 Long term (current) use of oral hypoglycemic drugs: Secondary | ICD-10-CM | POA: Diagnosis not present

## 2023-06-14 DIAGNOSIS — Z139 Encounter for screening, unspecified: Secondary | ICD-10-CM | POA: Diagnosis not present

## 2023-06-14 DIAGNOSIS — Z86718 Personal history of other venous thrombosis and embolism: Secondary | ICD-10-CM | POA: Insufficient documentation

## 2023-06-14 DIAGNOSIS — Z79899 Other long term (current) drug therapy: Secondary | ICD-10-CM | POA: Diagnosis not present

## 2023-06-14 DIAGNOSIS — Z87891 Personal history of nicotine dependence: Secondary | ICD-10-CM | POA: Insufficient documentation

## 2023-06-14 DIAGNOSIS — Z7722 Contact with and (suspected) exposure to environmental tobacco smoke (acute) (chronic): Secondary | ICD-10-CM | POA: Diagnosis not present

## 2023-06-14 DIAGNOSIS — Z7901 Long term (current) use of anticoagulants: Secondary | ICD-10-CM | POA: Diagnosis not present

## 2023-06-14 DIAGNOSIS — I480 Paroxysmal atrial fibrillation: Secondary | ICD-10-CM | POA: Diagnosis not present

## 2023-06-14 DIAGNOSIS — R5381 Other malaise: Secondary | ICD-10-CM

## 2023-06-14 DIAGNOSIS — Z8546 Personal history of malignant neoplasm of prostate: Secondary | ICD-10-CM | POA: Diagnosis not present

## 2023-06-14 DIAGNOSIS — Z720 Tobacco use: Secondary | ICD-10-CM | POA: Diagnosis not present

## 2023-06-14 DIAGNOSIS — I428 Other cardiomyopathies: Secondary | ICD-10-CM | POA: Diagnosis not present

## 2023-06-14 DIAGNOSIS — I2510 Atherosclerotic heart disease of native coronary artery without angina pectoris: Secondary | ICD-10-CM | POA: Diagnosis not present

## 2023-06-14 DIAGNOSIS — Z91148 Patient's other noncompliance with medication regimen for other reason: Secondary | ICD-10-CM | POA: Insufficient documentation

## 2023-06-14 DIAGNOSIS — I5022 Chronic systolic (congestive) heart failure: Secondary | ICD-10-CM

## 2023-06-14 DIAGNOSIS — J449 Chronic obstructive pulmonary disease, unspecified: Secondary | ICD-10-CM | POA: Diagnosis not present

## 2023-06-14 DIAGNOSIS — Z85038 Personal history of other malignant neoplasm of large intestine: Secondary | ICD-10-CM | POA: Insufficient documentation

## 2023-06-14 DIAGNOSIS — Z5982 Transportation insecurity: Secondary | ICD-10-CM | POA: Diagnosis not present

## 2023-06-14 DIAGNOSIS — J9611 Chronic respiratory failure with hypoxia: Secondary | ICD-10-CM

## 2023-06-14 DIAGNOSIS — R079 Chest pain, unspecified: Secondary | ICD-10-CM | POA: Insufficient documentation

## 2023-06-14 DIAGNOSIS — Z86711 Personal history of pulmonary embolism: Secondary | ICD-10-CM | POA: Diagnosis not present

## 2023-06-14 DIAGNOSIS — I110 Hypertensive heart disease with heart failure: Secondary | ICD-10-CM | POA: Diagnosis not present

## 2023-06-14 LAB — CBC
HCT: 35.2 % — ABNORMAL LOW (ref 39.0–52.0)
Hemoglobin: 11.4 g/dL — ABNORMAL LOW (ref 13.0–17.0)
MCH: 30.2 pg (ref 26.0–34.0)
MCHC: 32.4 g/dL (ref 30.0–36.0)
MCV: 93.4 fL (ref 80.0–100.0)
Platelets: 328 10*3/uL (ref 150–400)
RBC: 3.77 MIL/uL — ABNORMAL LOW (ref 4.22–5.81)
RDW: 16.1 % — ABNORMAL HIGH (ref 11.5–15.5)
WBC: 7.3 10*3/uL (ref 4.0–10.5)
nRBC: 0 % (ref 0.0–0.2)

## 2023-06-14 LAB — BASIC METABOLIC PANEL WITH GFR
Anion gap: 11 (ref 5–15)
BUN: 15 mg/dL (ref 8–23)
CO2: 23 mmol/L (ref 22–32)
Calcium: 9.1 mg/dL (ref 8.9–10.3)
Chloride: 103 mmol/L (ref 98–111)
Creatinine, Ser: 1.02 mg/dL (ref 0.61–1.24)
GFR, Estimated: >60 mL/min (ref >60)
Glucose, Bld: 108 mg/dL — ABNORMAL HIGH (ref 70–99)
Potassium: 3.7 mmol/L (ref 3.5–5.1)
Sodium: 137 mmol/L (ref 135–145)

## 2023-06-14 LAB — BRAIN NATRIURETIC PEPTIDE: B Natriuretic Peptide: 994.7 pg/mL — ABNORMAL HIGH (ref 0.0–100.0)

## 2023-06-14 MED ORDER — BISOPROLOL FUMARATE 5 MG PO TABS: 2.5000 mg | ORAL_TABLET | Freq: Every day | ORAL | 3 refills | Status: DC

## 2023-06-14 NOTE — Progress Notes (Signed)
 H&V Care Navigation CSW Progress Note  Clinical Social Worker met with pt to discuss food insecurity and housing concerns.  Pt reports he eats enough by going ok- mainly having issues with keeping food in the house due to room mates poor house keeping.  Has no where to store food or cook food- not interested in food bag from clinic at this time.  Has applied for food stamps in the past but only awarded $16 last time he tried- encouraged him to try again now that most his check goes towards rent he might be awarded more.  Patient also looking to move from current location to somewhere alone.  States he could purchase a camper and stay at a site- most sites appear to be over his rental range of $600- CSW Continuing to search for options.  CSW providing paramedic with housing list for patient to get on waitlist.  Was on waitlist for [facility] but turned down option provided to him due to cleanliness/mold concerns.  No further needs expressed at this time- CSW will follow up as needed   SDOH Screenings   Food Insecurity: Food Insecurity Present (06/08/2023)  Housing: High Risk (06/08/2023)  Transportation Needs: No Transportation Needs (06/08/2023)  Recent Concern: Transportation Needs - Unmet Transportation Needs (05/25/2023)  Utilities: Not At Risk (06/08/2023)  Financial Resource Strain: High Risk (04/12/2022)  Physical Activity: Inactive (02/05/2017)  Social Connections: Unknown (05/25/2023)  Stress: Stress Concern Present (02/05/2017)  Tobacco Use: Medium Risk (06/14/2023)   Burna Sis, LCSW Clinical Social Worker [facility] Desk#: [phone] Cell#: [phone]

## 2023-06-14 NOTE — Patient Instructions (Addendum)
 Thank you for coming in today  If you had labs drawn today, any labs that are abnormal the clinic will call you No news is good news  You have been referred to home health Physical and occupational therapy, their office will call you for further appointment details    Medications: Stop Coreg Start Bisoprolol 2.5 mg daily   Follow up appointments:  Your physician recommends that you schedule a follow-up appointment in:  6-8 weeks in clinic   Do the following things EVERYDAY: Weigh yourself in the morning before breakfast. Write it down and keep it in a log. Take your medicines as prescribed Eat low salt foods--Limit salt (sodium) to 2000 mg per day.  Stay as active as you can everyday Limit all fluids for the day to less than 2 liters   At the [facility], you and your health needs are our priority. As part of our continuing mission to provide you with exceptional heart care, we have created designated Provider Care Teams. These Care Teams include your primary Cardiologist (physician) and Advanced Practice Providers (APPs- Physician Assistants and Nurse Practitioners) who all work together to provide you with the care you need, when you need it.   You may see any of the following providers on your designated Care Team at your next follow up: Dr Arvilla Meres Dr Marca Ancona Dr. Marcos Eke, NP Robbie Lis, [place] [facility] [place], [place] Brynda Peon, NP Karle Plumber, PharmD   Please be sure to bring in all your medications bottles to every appointment.    Thank you for choosing [facility]  If you have any questions or concerns before your next appointment please send [place] a message through [place] or call our office at [phone].    TO LEAVE A MESSAGE FOR THE NURSE SELECT OPTION 2, PLEASE LEAVE A MESSAGE INCLUDING: YOUR NAME DATE OF BIRTH CALL BACK NUMBER REASON FOR CALL**this  is important as we prioritize the call backs  YOU WILL RECEIVE A CALL BACK THE SAME DAY AS LONG AS YOU CALL BEFORE 4:00 PM

## 2023-06-15 ENCOUNTER — Other Ambulatory Visit

## 2023-06-15 ENCOUNTER — Telehealth: Payer: Self-pay

## 2023-06-15 NOTE — Progress Notes (Signed)
 Paramedicine Encounter  Patient ID: Bruce [name], male, DOB: 17-Jun-1945, 78 y.o.,  MRN: [id number]  Met patient in clinic today with provider.  Aramis reports to be having short of breath, general fatigue and weakness.  He brought his meds into the clinic- reviewed with Brynda Peon NP  I removed carvedilol from pill box and he plans to pick up bisoprolol and add to pill box I will be seeing him in the home early next week. He agreed and knows to reach out if anything changes.   Weight @ clinic-142lbs  Weight @ home- has not weighed   B/P- 96/58  P-83  [id number] on 3LPM  REDS CLIP-NA  Med changes-  Stop carvedilol  Start Bisoprolol 2.5mg       [name], EMT-Paramedic [phone] 06/15/2023

## 2023-06-15 NOTE — Transitions of Care (Post Inpatient/ED Visit) (Signed)
 Care Management  Transitions of Care Program Transitions of Care Post-discharge week 2  06/15/2023 Name: Bruce [name] MRN: [id number] DOB: 1945/04/11  Subjective: Bruce [name] is a 78 y.o. year old male who is a primary care patient of Miki Kins, FNP. The Care Management team was unable to reach the patient by phone to assess and address transitions of care needs.   Plan: Additional outreach attempts will be made to reach the patient enrolled in the [facility] Program (Post Inpatient/ED Visit).  Deidre Ala, BSN, RN [place]  VBCI - [employer] [facility] RN Care Manager [phone]

## 2023-06-15 NOTE — Transitions of Care (Post Inpatient/ED Visit) (Deleted)
   06/15/2023  Name: Bruce [name] MRN: [id number] DOB: [birth year]  error

## 2023-06-19 ENCOUNTER — Other Ambulatory Visit: Payer: Self-pay

## 2023-06-19 NOTE — Transitions of Care (Post Inpatient/ED Visit) (Signed)
 Transition of Care week 2  Visit Note  06/19/2023  Name: Bruce [name] MRN: [id number]          DOB: [birth year]  Situation: Patient enrolled in [facility] 30-day program. Visit completed with Rob Hickman by telephone.   Background: Transition of Care week 2  Visit Note  06/19/2023  Name: Bruce [name] MRN: [id number]          DOB: 1945/09/11  Situation: Patient enrolled in [facility] 30-day program. Visit completed with Rob Hickman by telephone.   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 01/01/2015   Complication of anesthesia    wake up with a head ache   COPD (chronic obstructive pulmonary disease) (HCC)    Coronary artery disease    Edema 08/16/2010   GERD (gastroesophageal reflux disease)    Headache    related to sinus congestion   History of blood transfusion    History of kidney stones    History of MI (myocardial infarction) 10/08/2013   Hypertension    Hypokalemia 04/09/2022   Intracranial bleed (HCC) 12/31/2018   Lactic acidosis 04/09/2022   Leg cramps 08/16/2010   Malignant neoplasm of prostate (HCC) 07/13/2016   Multiple closed facial bone fractures (HCC)    Myocardial infarction [facility])    '09 AND '12   Peripheral vascular disease (HCC)    Shortness of breath    With exertion .   Tibia/fibula fracture 10/06/2013   Tibial plateau fracture 12/29/2014   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HCC) 10/08/2013   UTI (urinary tract infection)    frequent UTI   Visual hallucination 03/18/2015    Assessment: TOC Outreach completed to the patient  today. He states he just got home from going to get something to eat and to look at a new place to live. He does not like his current living situation because his roommate is an alcoholic and smokes and does not pay the rent and the patient pays everything. The patient has his portable oxygen tank with him but states it only lasts an hour when he is out and he has to go home and charge it. The patient reports compliance with taking his medication and states he is not smoking. The paramedicine team member will be at his home tomorrow to check his weight and fill his pillbox. The patient states he worries about not having enough money to last him until the end of the month and is concerned about food but eats out most days. He does have a EBT card that he reports he uses.  Patient Reported Symptoms: Cognitive Cognitive Status: Alert and oriented to person, place, and time      Neurological    No symptoms reported  HEENT HEENT Symptoms Reported: No symptoms reported      Cardiovascular Cardiovascular Symptoms Reported: Swelling in legs or feet Does patient have uncontrolled Hypertension?: No Cardiovascular Conditions: Heart failure Cardiovascular Management Strategies: Medication therapy, Weight management Do You Have a Working Readable Scale?:  Yes (Can't see it. Relies on Paramedicine to weight) Weight: 142 lb (64.4 kg) Cardiovascular Self-Management Outcome: 2 (bad) Cardiovascular Comment: The patient relies on Paramedicine to manage his heart failure  Respiratory Respiratory Symptoms Reported: Shortness of breath, Wheezing Additional Respiratory Details: History of PE, COPD, HF Respiratory Conditions: Shortness of breath, COPD Respiratory Self-Management Outcome: 2 (bad) Respiratory Comment: The patient is relies on oxygen to breathe  Endocrine Patient reports the following symptoms related to hypoglycemia or hyperglycemia : No symptoms reported    Gastrointestinal Gastrointestinal Symptoms  Reported: No symptoms reported   Nutrition Risk Screen (CP): No indicators present  Genitourinary  No symptoms reported    Integumentary Integumentary Symptoms Reported: Wound Additional Integumentary Details: The sore is getter bigger and is draining Skin Conditions: Wound Skin Management Strategies: Medication therapy, Dressing changes Skin Self-Management Outcome: 3 (uncertain) Skin Comment: The wound is now getting worse and draining. He states it is painful  Musculoskeletal Musculoskelatal Symptoms Reviewed: Unsteady gait Musculoskeletal Conditions: Unsteady gait Musculoskeletal Self-Management Outcome: 3 (uncertain) Musculoskeletal Comment: Uses a cane. States he uses a crutch sometimes. He has pain in his hip Falls in the past year?: Yes Number of falls in past year: 2 or more Was there an injury with Fall?: No Fall Risk Category Calculator: 2 Patient Fall Risk Level: Moderate Fall Risk Patient at Risk for Falls Due to: History of fall(s), Impaired balance/gait Fall risk Follow up: Education provided, Falls evaluation completed, Falls prevention discussed  Psychosocial Psychosocial Symptoms Reported: No symptoms reported         There were no vitals filed for this visit.  Medications Reviewed Today     Reviewed by Redge Gainer, RN (Case Manager) on 06/19/23 at 1305  Med List Status: <None>   Medication Order Taking? Sig Documenting Provider Last Dose Status Informant  acetaminophen (TYLENOL) 500 MG tablet [id number] No Take 2 tablets (1,000 mg total) by mouth every 6 (six) hours as needed for mild pain (pain score 1-3), moderate pain (pain score 4-6), fever or headache. Champ Mungo, DO Taking Active   albuterol (VENTOLIN HFA) 108 (90 Base) MCG/ACT inhaler [id number] No TAKE TWO PUFFS BY MOUTH AS NEEDED FOR WHEEZING EVERY 4-6 HOURS Laurier Nancy, MD Taking Active Pharmacy Records, EMS/Transport Team, Self  ALPRAZolam Prudy Feeler) 0.5 MG tablet [id number] No Take 1 tablet  (0.5 mg total) by mouth 2 (two) times daily as needed for anxiety. Miki Kins, FNP Taking Active   amoxicillin-clavulanate (AUGMENTIN) 875-125 MG tablet [id number] No Take 1 tablet by mouth 2 (two) times daily for 22 days. Rolly Salter, MD Taking Active   apixaban (ELIQUIS) 5 MG TABS tablet [id number] No TAKE ONE TABLET (5 MG TOTAL) BY MOUTH TWO (TWO) TIMES DAILY. Bensimhon, Bevelyn Buckles, MD Taking Active Pharmacy Records, EMS/Transport Team, Self  bisoprolol (ZEBETA) 5 MG tablet [id number]  Take 0.5 tablets (2.5 mg total) by mouth daily. Alen Bleacher, NP  Active   budesonide (PULMICORT) 0.25 MG/2ML nebulizer solution [id number] No Take 2 mLs (0.25 mg total) by nebulization 2 (two) times daily. Champ Mungo, DO Taking Active   cetirizine (ZYRTEC) 10 MG tablet [id number] No Take 1 tablet (10 mg total) by mouth daily. [name], NP Taking Active Pharmacy Records, EMS/Transport Team, Self  cholecalciferol (VITAMIN D3) 25 MCG (1000 UNIT) tablet [id number] No Take 1,000 Units by mouth in the morning. [provider] Taking Active Pharmacy Records, EMS/Transport Team, Self  Elastic Bandages & Supports (MEDICAL COMPRESSION STOCKINGS) MISC [id number] No Wear daily as needed  Miki Kins, FNP Taking Active   FARXIGA 10 MG TABS tablet [id number] No TAKE ONE TABLET (10 MG TOTAL) BY MOUTH DAILY. [place], Anderson Malta, FNP Taking Active Pharmacy Records, EMS/Transport Team, Self  feeding supplement (ENSURE ENLIVE / ENSURE PLUS) LIQD [id number] No Take 237 mLs by mouth 2 (two) times daily between meals. Rolly Salter, MD Taking Active   fluticasone [facility]) 50 MCG/ACT nasal spray [id number] No Place 1 spray into both nostrils daily as needed for allergies. [provider] Taking Active Pharmacy Records, EMS/Transport Team, Self  furosemide (LASIX) 20 MG tablet [id number] No Take 2 tablets (40 mg total) by mouth daily. [place], [place], FNP Taking Active   gabapentin (NEURONTIN) 100 MG capsule  [id number] No TAKE ONE CAPSULE (100 MG TOTAL) BY MOUTH THREE (THREE) TIMES DAILY. Miki Kins, FNP Taking Active   lidocaine (LIDODERM) 5 % [id number] No Place 1 patch onto the skin daily. Remove & Discard patch within 12 hours or as directed by MD [name], NP Taking Active Pharmacy Records, EMS/Transport Team, Self  Multiple Vitamin (MULTIVITAMIN) tablet [id number] No Take 1 tablet by mouth in the morning. For Men [provider] Taking Active Self, Pharmacy Records, EMS/Transport Team  Omega-3 Fatty Acids (FISH OIL PO) [id number] No Take 1,000 mg by mouth every evening. [provider] Taking Active Self, Pharmacy Records, EMS/Transport Team  omeprazole (PRILOSEC) 40 MG capsule [id number] No Take 1 capsule (40 mg total) by mouth 2 (two) times daily. Miki Kins, FNP Taking Active   OXYGEN [id number] No Inhale 3 L/min into the lungs continuous. [provider] Taking Active   oxymetazoline (AFRIN) 0.05 % nasal spray [id number] No Place 1 spray into both nostrils 2 (two) times daily as needed (nose bleed). Rolly Salter, MD Taking Active   rOPINIRole (REQUIP) 1 MG tablet [id number] No TAKE ONE TABLET (1 MG TOTAL) BY MOUTH IN THE MORNING AND AT BEDTIME. Miki Kins, FNP Taking Active   rosuvastatin (CRESTOR) 20 MG tablet [id number] No Take 1 tablet (20 mg total) by mouth daily. Champ Mungo, DO Taking Active   sildenafil (REVATIO) 20 MG tablet [id number] No Take 1 tablet (20 mg total) by mouth 3 (three) times daily. [place], Anderson Malta, FNP Taking Active Pharmacy Records, EMS/Transport Team, Self           Med Note Azucena Fallen   Wed Apr 18, 2023  5:45 PM)    Tiotropium Bromide-Olodaterol (STIOLTO RESPIMAT) 2.5-2.5 MCG/ACT AERS [id number] No Inhale 2 puffs into the lungs daily. Champ Mungo, DO Taking Active   Wound Cleansers (VASHE WOUND) 0.033 % SOLN [id number] No Apply 1 Act topically daily at 12 noon. Rolly Salter, MD Taking Active              Recommendation:   Paramedicine team to fill pillbox weekly and weigh patient for Heart Failure Take medication as directed Oxygen at 3l/min continuous. May increase to 4l/min with exertion Use cane when ambulating for stability Continue smoking cessation Elevate legs when sitting Monitor sore on left ankle for increased drainage and report to the provider Change the bandage daily to left ankle  Follow Up Plan:   Telephone follow-up in 1 week  Deidre Ala, BSN, RN [place]  VBCI - [facility] RN Care Manager [phone]

## 2023-06-20 ENCOUNTER — Telehealth (HOSPITAL_COMMUNITY): Payer: Self-pay

## 2023-06-20 NOTE — Telephone Encounter (Signed)
 Spoke to [place] who refused a paramedicine visit tomorrow. He reports he is not feeling good and doesn't want to be around anyone. I asked him what was going on and he said I just don't feel good. I asked if he has his medications and he says his pill box is still full indicating he has not been compliant. I advised him the importance of being compliant and he said he understood. I asked if he was sure I could come out tomorrow and he said not this week. I plan to see him in one week- he agreed. Call complete.   Roberts Ching, EMT-Paramedic [phone] 06/20/2023

## 2023-06-21 ENCOUNTER — Inpatient Hospital Stay: Admitting: Infectious Diseases

## 2023-06-21 ENCOUNTER — Telehealth: Payer: Self-pay

## 2023-06-21 NOTE — Telephone Encounter (Signed)
 Left voicemail requesting pt call to reschedule missed appt. Julien Odor, RMA

## 2023-06-26 ENCOUNTER — Other Ambulatory Visit: Payer: Self-pay

## 2023-06-26 ENCOUNTER — Telehealth (HOSPITAL_COMMUNITY): Payer: Self-pay

## 2023-06-26 NOTE — Telephone Encounter (Signed)
 Contacted Julian for confirmation of home visit tomorrow- no answer. I left a VM. I will follow up with a phone call tomorrow also.   Roberts Ching, EMT-Paramedic [phone] 06/26/2023

## 2023-06-26 NOTE — Transitions of Care (Post Inpatient/ED Visit) (Signed)
 Transition of Care week 3  Visit Note  06/26/2023  Name: Bruce [name] MRN: [id number]          DOB: 05-06-1945  Situation: Patient enrolled in [facility] 30-day program. Visit completed with Barba Bonnet by telephone.   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 01/01/2015   Complication of anesthesia    wake up with a head ache   COPD (chronic obstructive pulmonary disease) (HCC)    Coronary artery disease    Edema 08/16/2010   GERD (gastroesophageal reflux disease)    Headache    related to sinus congestion   History of blood transfusion    History of kidney stones    History of MI (myocardial infarction) 10/08/2013   Hypertension    Hypokalemia 04/09/2022   Intracranial bleed (HCC) 12/31/2018   Lactic acidosis 04/09/2022   Leg cramps 08/16/2010   Malignant neoplasm of prostate (HCC) 07/13/2016   Multiple closed facial bone fractures (HCC)    Myocardial infarction [facility])    '09 AND '12   Peripheral vascular disease (HCC)    Shortness of breath    With exertion .   Tibia/fibula fracture 10/06/2013   Tibial plateau fracture 12/29/2014   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HCC) 10/08/2013   UTI (urinary tract infection)    frequent UTI   Visual hallucination 03/18/2015    Assessment: TOC Outreach to the patient today. He did not go to his appointment with Infectious Disease and he did not allow the Paramedicine team to go on the home visit. He states he had loose stools and did not feel good. He states he doesn't know if he is taking his medication or  what medications need to be filled. He has been mostly in the bed since last week. He has turned his oxygen  up to 4.5l/min. He states he feels weak. The sore on his ankle is getting worse. The wound is increased in size and drainage and pain. He declines home health nursing and he has no one to care for him. He declines the hospital. He states he may need to move into a home where someone can care for him. He consented to [facility] reaching out to the Child psychotherapist. He has a history of not following through on recommendations. Long discussion regarding ACP and hospitalizations. The patient is depressed about his current health and understands that he will not get better. Provider notified.   Patient Reported Symptoms: Cognitive Cognitive Status: Alert and oriented to person, place, and time      Neurological Neurological Review of Symptoms: No symptoms reported    HEENT HEENT Symptoms Reported: No symptoms reported      Cardiovascular Cardiovascular Symptoms Reported: Fatigue Does patient have uncontrolled Hypertension?: No Cardiovascular Conditions: Heart failure Cardiovascular Management Strategies: Medication therapy Do You Have a Working Readable Scale?: No Weight: 142 lb (64.4 kg) Cardiovascular Self-Management Outcome: 2 (bad) Cardiovascular Comment: Paramedicine manages his weight and fills his pillbox  Respiratory Respiratory Symptoms Reported: Productive  cough, Shortness of breath Other Respiratory Symptoms: Fatigue Additional Respiratory Details: Severe COPD, SOB with the slightest exertion Respiratory Conditions: COPD, Shortness of breath, Cough Respiratory Self-Management Outcome: 2 (bad) Respiratory Comment: The patient is end stage COPD and does not have many treatment options  Endocrine Patient reports the following symptoms related to hypoglycemia or hyperglycemia : No symptoms reported Is patient diabetic?: No    Gastrointestinal Gastrointestinal Symptoms Reported: Diarrhea,  Cramping, Nausea Additional Gastrointestinal Details: States it has been going on for a few days Gastrointestinal Management Strategies: Adequate rest, Fluid modification Gastrointestinal Self-Management Outcome: 3 (uncertain) Gastrointestinal Comment: the patient states it is better today Nutrition Risk Screen (CP): Large or nonhealing wound, burn or pressure injury  Genitourinary Genitourinary Symptoms Reported: No symptoms reported    Integumentary Integumentary Symptoms Reported: Wound Additional Integumentary Details: No change. the patient feels it is getting bigger Skin Conditions: Wound Skin Self-Management Outcome: 2 (bad) Skin Comment: The patient does not cleanse and put a bandage on the wound. he declined home health nurse  Musculoskeletal Musculoskelatal Symptoms Reviewed: Difficulty walking, Weakness, Unsteady gait Additional Musculoskeletal Details: Pain in his ankle causes unsteady gait Musculoskeletal Conditions: Unsteady gait Musculoskeletal Management Strategies: Adequate rest, Medical device Musculoskeletal Self-Management Outcome: 3 (uncertain) Musculoskeletal Comment: Spends most of his day in bed due to SOB Falls in the past year?: Yes Number of falls in past year: 2 or more Was there an injury with Fall?: No Fall Risk Category Calculator: 2 Patient Fall Risk Level: Moderate Fall Risk Patient at Risk for Falls Due to: Impaired balance/gait Fall risk Follow up: Falls prevention discussed  Psychosocial Psychosocial Symptoms Reported: Depression - if selected complete PHQ 2-9         There were no vitals filed for this visit.  Medications Reviewed Today   Medications were not reviewed in this encounter     Recommendation:   Social work follow up for placement. The patient cannot care for himself at home Compliance with medication administration Increase Fluids Nebulizer every 4 hours as needed for SOB and wheezing Oxygen  at 4.5l/min continuous Allow  Paramedicine Team to make a visit to evaluate   Follow Up Plan:   Telephone follow-up in 1 week  Gareld June, BSN, RN [place]  VBCI - [facility] RN Care Manager [phone]

## 2023-06-26 NOTE — Patient Instructions (Signed)
 Visit Information  Thank you for taking time to visit with me today. Please don't hesitate to contact me if I can be of assistance to you before our next scheduled telephone appointment.  Our next appointment is by telephone on Tuesday April 29th at 1:15pm  Following is a copy of your care plan:   Goals Addressed             This Visit's Progress    VBCI [facility] Care Plan   Not on track    Current Barriers: Updated 06/26/23 Knowledge Deficits related to plan of care for management of CHF  Care Coordination needs related to Limited social support, Medication procurement, and Limited education about Heart Failure and the need for medications Chronic Disease Management support and education needs related to CHF  Lacks caregiver support Non-adherence to prescribed medication regimen Lack of follow through on recommendations and education  RNCM Clinical Goal(s):   Updated 06/26/23 Patient will work with the Care Management team over the next 30 days to address Transition of Care Barriers: Medication access Medication Management Diet/Nutrition/Food Resources Provider appointments Functional/Safety verbalize understanding of plan for management of CHF and COPD as evidenced by following the discharge instructions and by taking your medications demonstrate understanding of rationale for each prescribed medication as evidenced by taking the medication as directed  Work through collaboration with Medical illustrator, provider, including the Paramedicine team that comes by weekly Wear oxygen  3l/min continuous and increase to 4l/min if SOB with exertion Stop smoking Fall Precautions  Manage pain in ankle and hip Cover sore to ankle with a dressing to contain drainage  Interventions: updated 06/26/23 Evaluation of current treatment plan related to  self management and patient's adherence to plan as established by provider   Heart Failure Interventions:  ( Goal on track:  NO.) Short Term Goal Updated  06/26/23 Basic overview and discussion of pathophysiology of Heart Failure reviewed Provided education on low sodium diet Advised patient to weigh each morning after emptying bladder Discussed importance of daily weight and advised patient to weigh and record daily Reviewed role of diuretics in prevention of fluid overload and management of heart failure; Discussed the importance of keeping all appointments with provider The patient does not weigh because he cannot see the scales. His Paramedicine provider will check for CHF weekly  and will weigh him Additional assistance for compliance and medication management with Paramedicine Adding compression garments to the regime daily Contact Social Worker regarding placement due to inability to care for himself  Patient Goals/Self-Care Activities:  Updated 06/26/23 Participate in Transition of Care Program/Attend [facility] scheduled calls Notify RN Care Manager of [facility] call rescheduling needs Take all medications as prescribed Attend all scheduled provider appointments Call pharmacy for medication refills 3-7 days in advance of running out of medications Wear compression stockings daily  Follow Up Plan:  Telephone follow up appointment with care management team member scheduled for:  Tuesday April 29th at  1:15pm         The patient has been provided with contact information for the care management team and has been advised to call with any health related questions or concerns.   Please call the care guide team at [phone] if you need to cancel or reschedule your appointment.   Please call the Suicide and Crisis Lifeline: 988 call the [place]  National Suicide Prevention Lifeline: 939-844-6566 or TTY: (639) 601-5730 TTY 570-483-7003) to talk to a trained counselor if you are experiencing a Mental Health or Behavioral Health Crisis  or need someone to talk to.  Gareld June, BSN, RN [place]  VBCI - [facility] RN Care  Manager [phone]

## 2023-06-27 ENCOUNTER — Telehealth (HOSPITAL_COMMUNITY): Payer: Self-pay | Admitting: Licensed Clinical Social Worker

## 2023-06-27 ENCOUNTER — Other Ambulatory Visit (HOSPITAL_COMMUNITY): Payer: Self-pay

## 2023-06-27 ENCOUNTER — Other Ambulatory Visit: Payer: Self-pay | Admitting: Family

## 2023-06-27 NOTE — Telephone Encounter (Signed)
 CSW informed by [employer] that pt is struggling to care for himself at home and is now interested in moving into ALF/SNF to receive assistance.  CSW attempted to call pt to discuss and get permission to share information with local facilities regarding his condition and insurance to see if he might be eligible to stay- unable to reach- left VM requesting return call.  Denton Flakes, LCSW Clinical Social Worker [facility] Desk#: [phone] Cell#: [phone]

## 2023-06-27 NOTE — Progress Notes (Signed)
 Paramedicine Encounter    Patient ID: Bruce [name], male    DOB: 04/24/1945, 78 y.o.   MRN: [id number]   Complaints- tailbone pain from a fall on Saturday, shortness of breath, general fatigue   Assessment- CAOX4, warm and dry, seated on couch, wearing 3LPM oxygen  via [place]. He is short of breath on exertion and at rest. O2 92% on 3LPM, vitals obtained as noted. Slight lower leg edema noted bilaterally. Lungs clear. Denied chest pain, says he is getting dizzy sometimes.   Compliance with meds- missed 4 morning , 4 noon and 4 evening doses.   Pill box filled- for one week   Refills needed- eliquis  and flonase    Meds changes since last visit- none     Social changes- interested in assisted living- understanding that his health is declining and he is requiring more support. I called Marylin So LCSW and she is seeking out options for same.    VISIT SUMMARY- Arrived for home visit for [facility] who was seated in living room of his roommates home. He is short of breath at rest wearing 3LPM of oxygen  via [place]. Lungs clear. Some lower leg edema noted. Vitals as noted in report. He missed 4 doses of meds over the last week. I reviewed same and filled pill box for one week. Refills will be called into [place]. He is asking if his doctors can be moved to [place]. I will get HF clinic transferred there. I rescheduled follow up for infectious disease for Monday at 11:15. I will reschedule PCP and Pulmonary apt. We discussed care goals and he reports he understands his health is continuing to decline and he is interested in assisted living- I will have [place] assist. In the mean time I advised him to comply with meds and oxygen /nebulizers/inhalers and appointments and diet so we can try to make him as successful as possible at home until other options become available. He agreed. He was very sad and depressed today and I spent time encouraging him and providing support. Home visit complete. I will follow up in  one week.   BP 110/62   Pulse 86   Resp 16   Wt 147 lb (66.7 kg)   SpO2 92% Comment: 3 LPM  BMI 19.39 kg/m  Weight yesterday-- didn't weigh  Last visit weight-- 142lbs      ACTION: Home visit completed     Patient Care Team: Trenda Frisk, FNP as PCP - General (Family Medicine) Cherrie Cornwall, MD as PCP - Cardiology (Cardiology) Claudene Crystal, RN as [facility] Care Management  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   Pleural effusion, right 05/24/2023   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   Pulmonary embolism on right (HCC) 05/24/2023   Neuropathy 05/19/2023   Acute on chronic respiratory failure with hypoxia (HCC) 04/19/2023   Acute decompensated heart failure (HCC) 04/18/2023   Prolonged Q-T interval on ECG 04/18/2023   Influenza A 04/18/2023   Cor pulmonale (chronic) (HCC) 04/18/2023   Intermittent claudication (HCC) 12/18/2022   B12 deficiency 12/18/2022   MDD (major depressive disorder), recurrent episode, moderate (HCC) 09/01/2022   Pulmonary hypertension, unspecified (HCC) 04/14/2022   Acute on chronic combined systolic and diastolic CHF (congestive heart failure) (HCC) 04/13/2022   Pulmonary embolism (HCC) 04/09/2022   PAF (  paroxysmal atrial fibrillation) (HCC)    NSTEMI (non-ST elevated myocardial infarction) (HCC) 10/10/2018   Syncope 04/28/2018   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   Limb ischemia 11/18/2013   Acute blood loss anemia 10/08/2013   Anxiety disorder 10/08/2013   Dyslipidemia 10/08/2013   GERD (gastroesophageal reflux disease) 10/08/2013   Anticoagulated 10/08/2013   Hypertension    CAD (coronary artery disease) 08/16/2010   COPD (chronic obstructive pulmonary disease) (HCC)  08/16/2010   DVT of leg (deep venous thrombosis) (HCC) 08/16/2010    Current Outpatient Medications:    acetaminophen  (TYLENOL ) 500 MG tablet, Take 2 tablets (1,000 mg total) by mouth every 6 (six) hours as needed for mild pain (pain score 1-3), moderate pain (pain score 4-6), fever or headache., Disp: 30 tablet, Rfl: 0   albuterol  (VENTOLIN  HFA) 108 (90 Base) MCG/ACT inhaler, TAKE TWO PUFFS BY MOUTH AS NEEDED FOR WHEEZING EVERY 4-6 HOURS, Disp: 8.5 g, Rfl: 5   ALPRAZolam  (XANAX ) 0.5 MG tablet, Take 1 tablet (0.5 mg total) by mouth 2 (two) times daily as needed for anxiety., Disp: 60 tablet, Rfl: 1   amoxicillin -clavulanate (AUGMENTIN ) 875-125 MG tablet, Take 1 tablet by mouth 2 (two) times daily for 22 days., Disp: 44 tablet, Rfl: 0   apixaban  (ELIQUIS ) 5 MG TABS tablet, TAKE ONE TABLET (5 MG TOTAL) BY MOUTH TWO (TWO) TIMES DAILY., Disp: 60 tablet, Rfl: 11   bisoprolol  (ZEBETA ) 5 MG tablet, Take 0.5 tablets (2.5 mg total) by mouth daily., Disp: 45 tablet, Rfl: 3   budesonide  (PULMICORT ) 0.25 MG/2ML nebulizer solution, Take 2 mLs (0.25 mg total) by nebulization 2 (two) times daily., Disp: 60 mL, Rfl: 12   cetirizine  (ZYRTEC ) 10 MG tablet, Take 1 tablet (10 mg total) by mouth daily., Disp: 30 tablet, Rfl: 11   cholecalciferol  (VITAMIN D3) 25 MCG (1000 UNIT) tablet, Take 1,000 Units by mouth in the morning., Disp: , Rfl:    Elastic Bandages & Supports (MEDICAL COMPRESSION STOCKINGS) MISC, Wear daily as needed, Disp: 2 each, Rfl: 0   FARXIGA  10 MG TABS tablet, TAKE ONE TABLET (10 MG TOTAL) BY MOUTH DAILY., Disp: 90 tablet, Rfl: 1   feeding supplement (ENSURE ENLIVE / ENSURE PLUS) LIQD, Take 237 mLs by mouth 2 (two) times daily between meals., Disp: 10000 mL, Rfl: 0   fluticasone  (FLONASE ) 50 MCG/ACT nasal spray, Place 1 spray into both nostrils daily as needed for allergies., Disp: , Rfl:    furosemide  (LASIX ) 20 MG tablet, Take 2 tablets (40 mg total) by mouth daily., Disp: 60 tablet, Rfl: 11    gabapentin  (NEURONTIN ) 100 MG capsule, TAKE ONE CAPSULE (100 MG TOTAL) BY MOUTH THREE (THREE) TIMES DAILY., Disp: 90 capsule, Rfl: 1   lidocaine  (LIDODERM ) 5 %, Place 1 patch onto the skin daily. Remove & Discard patch within 12 hours or as directed by MD, Disp: 30 patch, Rfl: 11   Multiple Vitamin (MULTIVITAMIN) tablet, Take 1 tablet by mouth in the morning. For Men, Disp: , Rfl:    Omega-3 Fatty Acids (FISH OIL PO), Take 1,000 mg by mouth every evening., Disp: , Rfl:    omeprazole  (PRILOSEC) 40 MG capsule, Take 1 capsule (40 mg total) by mouth 2 (two) times daily., Disp: 180 capsule, Rfl: 1   OXYGEN , Inhale 3 L/min into the lungs continuous., Disp: , Rfl:    oxymetazoline  (AFRIN) 0.05 % nasal spray, Place 1 spray into both nostrils 2 (two) times daily as needed (nose bleed)., Disp: 30 mL, Rfl: 0   rOPINIRole  (REQUIP ) 1  MG tablet, TAKE ONE TABLET (1 MG TOTAL) BY MOUTH IN THE MORNING AND AT BEDTIME., Disp: 60 tablet, Rfl: 3   rosuvastatin  (CRESTOR ) 20 MG tablet, Take 1 tablet (20 mg total) by mouth daily., Disp: 30 tablet, Rfl: 2   sildenafil  (REVATIO ) 20 MG tablet, Take 1 tablet (20 mg total) by mouth 3 (three) times daily., Disp: 90 tablet, Rfl: 5   Tiotropium Bromide-Olodaterol (STIOLTO RESPIMAT ) 2.5-2.5 MCG/ACT AERS, Inhale 2 puffs into the lungs daily., Disp: 4 g, Rfl: 12   Wound Cleansers (VASHE WOUND) 0.033 % SOLN, Apply 1 Act topically daily at 12 noon., Disp: 1000 mL, Rfl: 0 Allergies  Allergen Reactions   Adhesive [Tape] Other (See Comments)    After right leg fracture surgery, pt developed a large blister where tape was applied to his right leg. OK to use paper tape.   Eggs-Apples-Oats [Alitraq] Other (See Comments)    Caused chest pains   Imdur  [Isosorbide  Dinitrate] Other (See Comments)    hallucinations   Singulair  [Montelukast  Sodium] Other (See Comments)    Hallucinations      Social History   Socioeconomic History   Marital status: Single    Spouse name: Not on file    Number of children: 2   Years of education: Not on file   Highest education level: 6th grade  Occupational History   Not on file  Tobacco Use   Smoking status: Former    Current packs/day: 0.00    Average packs/day: 0.5 packs/day for 52.0 years (26.0 ttl pk-yrs)    Types: Cigarettes    Start date: 07/05/1970    Quit date: 07/05/2022    Years since quitting: 0.9   Smokeless tobacco: Never  Vaping Use   Vaping status: Never Used  Substance and Sexual Activity   Alcohol use: No    Alcohol/week: 0.0 standard drinks of alcohol    Comment: Stopped 2009   Drug use: No   Sexual activity: Not Currently  Other Topics Concern   Not on file  Social History Narrative   ** Merged History Encounter **       Social Drivers of Health   Financial Resource Strain: High Risk (04/12/2022)   Overall Financial Resource Strain (CARDIA)    Difficulty of Paying Living Expenses: Hard  Food Insecurity: Food Insecurity Present (06/08/2023)   Hunger Vital Sign    Worried About Running Out of Food in the Last Year: Sometimes true    Ran Out of Food in the Last Year: Sometimes true  Transportation Needs: No Transportation Needs (06/08/2023)   PRAPARE - Administrator, Civil Service (Medical): No    Lack of Transportation (Non-Medical): No  Recent Concern: Transportation Needs - Unmet Transportation Needs (05/25/2023)   PRAPARE - Administrator, Civil Service (Medical): Yes    Lack of Transportation (Non-Medical): No  Physical Activity: Inactive (02/05/2017)   Exercise Vital Sign    Days of Exercise per Week: 0 days    Minutes of Exercise per Session: 0 min  Stress: Stress Concern Present (02/05/2017)   [employer] of Occupational Health - Occupational Stress Questionnaire    Feeling of Stress : Rather much  Social Connections: Unknown (05/25/2023)   Social Connection and Isolation Panel [NHANES]    Frequency of Communication with Friends and Family: Twice a week    Frequency of  Social Gatherings with Friends and Family: Once a week    Attends Religious Services: Patient declined    Active  Member of Clubs or Organizations: No    Attends Banker Meetings: Never    Marital Status: Divorced  Catering manager Violence: Not At Risk (06/08/2023)   Humiliation, Afraid, Rape, and Kick questionnaire    Fear of Current or Ex-Partner: No    Emotionally Abused: No    Physically Abused: No    Sexually Abused: No    Physical Exam      Future Appointments  Date Time Provider Department Center  07/02/2023 11:15 AM Terre Ferri, MD [facility]  07/03/2023  1:15 PM Homsher, Candice Chalet, RN [facility] None  07/26/2023  2:00 PM [facility] PA/NP [facility] None

## 2023-06-29 ENCOUNTER — Telehealth (HOSPITAL_COMMUNITY): Payer: Self-pay | Admitting: Licensed Clinical Social Worker

## 2023-06-29 NOTE — Telephone Encounter (Signed)
 H&V Care Navigation CSW Progress Note  Clinical Social Worker spoke with [facility] admissions team with patient permission regarding SNF stay.  They report they can likely bring him in for short term rehab and work on transition to LTC if appropriate.  Need 3 night stay in last 30 days which pt has from hospital admission late march- she will speak with supervisor and get back with CSW Monday regarding this possible admission.  CSW also looking into potential ALF placement utilizing Medicaid- has list of [facility] options and looking into which accept LTC Medicaid.    Will continue to follow and assist as needed   SDOH Screenings   Food Insecurity: Food Insecurity Present (06/08/2023)  Housing: High Risk (06/08/2023)  Transportation Needs: No Transportation Needs (06/08/2023)  Recent Concern: Transportation Needs - Unmet Transportation Needs (05/25/2023)  Utilities: Not At Risk (06/08/2023)  Depression (PHQ2-9): Medium Risk (06/26/2023)  Financial Resource Strain: High Risk (04/12/2022)  Physical Activity: Inactive (02/05/2017)  Social Connections: Unknown (05/25/2023)  Stress: Stress Concern Present (02/05/2017)  Tobacco Use: Medium Risk (06/14/2023)   Denton Flakes, LCSW Clinical Social Worker [facility] Desk#: [phone] Cell#: [phone]

## 2023-07-02 ENCOUNTER — Emergency Department (HOSPITAL_COMMUNITY)

## 2023-07-02 ENCOUNTER — Telehealth (HOSPITAL_COMMUNITY): Payer: Self-pay | Admitting: Licensed Clinical Social Worker

## 2023-07-02 ENCOUNTER — Other Ambulatory Visit: Payer: Self-pay

## 2023-07-02 ENCOUNTER — Inpatient Hospital Stay (HOSPITAL_COMMUNITY)
Admission: EM | Admit: 2023-07-02 | Discharge: 2023-07-05 | DRG: 291 | Disposition: A | Discharge status: Hospice | Attending: Internal Medicine | Admitting: Internal Medicine

## 2023-07-02 ENCOUNTER — Encounter (HOSPITAL_COMMUNITY): Payer: Self-pay | Admitting: Emergency Medicine

## 2023-07-02 ENCOUNTER — Ambulatory Visit (INDEPENDENT_AMBULATORY_CARE_PROVIDER_SITE_OTHER): Admitting: Infectious Diseases

## 2023-07-02 VITALS — BP 105/62 | HR 100 | Temp 98.2°F

## 2023-07-02 DIAGNOSIS — Z6820 Body mass index (BMI) 20.0-20.9, adult: Secondary | ICD-10-CM

## 2023-07-02 DIAGNOSIS — J869 Pyothorax without fistula: Secondary | ICD-10-CM | POA: Insufficient documentation

## 2023-07-02 DIAGNOSIS — J9620 Acute and chronic respiratory failure, unspecified whether with hypoxia or hypercapnia: Secondary | ICD-10-CM | POA: Diagnosis not present

## 2023-07-02 DIAGNOSIS — I110 Hypertensive heart disease with heart failure: Secondary | ICD-10-CM | POA: Diagnosis not present

## 2023-07-02 DIAGNOSIS — I739 Peripheral vascular disease, unspecified: Secondary | ICD-10-CM | POA: Diagnosis not present

## 2023-07-02 DIAGNOSIS — I480 Paroxysmal atrial fibrillation: Secondary | ICD-10-CM | POA: Diagnosis not present

## 2023-07-02 DIAGNOSIS — Z8679 Personal history of other diseases of the circulatory system: Secondary | ICD-10-CM

## 2023-07-02 DIAGNOSIS — R64 Cachexia: Secondary | ICD-10-CM | POA: Diagnosis present

## 2023-07-02 DIAGNOSIS — I252 Old myocardial infarction: Secondary | ICD-10-CM | POA: Diagnosis not present

## 2023-07-02 DIAGNOSIS — J441 Chronic obstructive pulmonary disease with (acute) exacerbation: Secondary | ICD-10-CM | POA: Diagnosis not present

## 2023-07-02 DIAGNOSIS — Z87440 Personal history of urinary (tract) infections: Secondary | ICD-10-CM

## 2023-07-02 DIAGNOSIS — Z86718 Personal history of other venous thrombosis and embolism: Secondary | ICD-10-CM

## 2023-07-02 DIAGNOSIS — I2781 Cor pulmonale (chronic): Secondary | ICD-10-CM | POA: Diagnosis present

## 2023-07-02 DIAGNOSIS — R6520 Severe sepsis without septic shock: Secondary | ICD-10-CM

## 2023-07-02 DIAGNOSIS — Z7901 Long term (current) use of anticoagulants: Secondary | ICD-10-CM | POA: Diagnosis not present

## 2023-07-02 DIAGNOSIS — R0600 Dyspnea, unspecified: Principal | ICD-10-CM

## 2023-07-02 DIAGNOSIS — Z9981 Dependence on supplemental oxygen: Secondary | ICD-10-CM | POA: Diagnosis not present

## 2023-07-02 DIAGNOSIS — I272 Pulmonary hypertension, unspecified: Secondary | ICD-10-CM | POA: Diagnosis not present

## 2023-07-02 DIAGNOSIS — Z66 Do not resuscitate: Secondary | ICD-10-CM | POA: Diagnosis not present

## 2023-07-02 DIAGNOSIS — I2782 Chronic pulmonary embolism: Secondary | ICD-10-CM | POA: Diagnosis not present

## 2023-07-02 DIAGNOSIS — I5023 Acute on chronic systolic (congestive) heart failure: Secondary | ICD-10-CM | POA: Insufficient documentation

## 2023-07-02 DIAGNOSIS — I10 Essential (primary) hypertension: Secondary | ICD-10-CM | POA: Diagnosis not present

## 2023-07-02 DIAGNOSIS — Z79899 Other long term (current) drug therapy: Secondary | ICD-10-CM

## 2023-07-02 DIAGNOSIS — Z85118 Personal history of other malignant neoplasm of bronchus and lung: Secondary | ICD-10-CM | POA: Insufficient documentation

## 2023-07-02 DIAGNOSIS — J9621 Acute and chronic respiratory failure with hypoxia: Secondary | ICD-10-CM | POA: Diagnosis not present

## 2023-07-02 DIAGNOSIS — E872 Acidosis, unspecified: Secondary | ICD-10-CM | POA: Diagnosis present

## 2023-07-02 DIAGNOSIS — R000 Tachycardia, unspecified: Secondary | ICD-10-CM | POA: Diagnosis not present

## 2023-07-02 DIAGNOSIS — K219 Gastro-esophageal reflux disease without esophagitis: Secondary | ICD-10-CM | POA: Diagnosis not present

## 2023-07-02 DIAGNOSIS — Z86711 Personal history of pulmonary embolism: Secondary | ICD-10-CM

## 2023-07-02 DIAGNOSIS — Z7951 Long term (current) use of inhaled steroids: Secondary | ICD-10-CM

## 2023-07-02 DIAGNOSIS — E876 Hypokalemia: Secondary | ICD-10-CM | POA: Diagnosis not present

## 2023-07-02 DIAGNOSIS — Z91012 Allergy to eggs: Secondary | ICD-10-CM

## 2023-07-02 DIAGNOSIS — J9611 Chronic respiratory failure with hypoxia: Secondary | ICD-10-CM | POA: Insufficient documentation

## 2023-07-02 DIAGNOSIS — R0602 Shortness of breath: Secondary | ICD-10-CM | POA: Diagnosis not present

## 2023-07-02 DIAGNOSIS — J984 Other disorders of lung: Secondary | ICD-10-CM | POA: Diagnosis not present

## 2023-07-02 DIAGNOSIS — J90 Pleural effusion, not elsewhere classified: Secondary | ICD-10-CM | POA: Diagnosis not present

## 2023-07-02 DIAGNOSIS — R0609 Other forms of dyspnea: Secondary | ICD-10-CM | POA: Diagnosis not present

## 2023-07-02 DIAGNOSIS — R0902 Hypoxemia: Secondary | ICD-10-CM | POA: Insufficient documentation

## 2023-07-02 DIAGNOSIS — Z8042 Family history of malignant neoplasm of prostate: Secondary | ICD-10-CM

## 2023-07-02 DIAGNOSIS — A419 Sepsis, unspecified organism: Secondary | ICD-10-CM

## 2023-07-02 DIAGNOSIS — Z8546 Personal history of malignant neoplasm of prostate: Secondary | ICD-10-CM

## 2023-07-02 DIAGNOSIS — R911 Solitary pulmonary nodule: Secondary | ICD-10-CM | POA: Diagnosis not present

## 2023-07-02 DIAGNOSIS — Z85038 Personal history of other malignant neoplasm of large intestine: Secondary | ICD-10-CM

## 2023-07-02 DIAGNOSIS — Z87891 Personal history of nicotine dependence: Secondary | ICD-10-CM | POA: Insufficient documentation

## 2023-07-02 DIAGNOSIS — Z91018 Allergy to other foods: Secondary | ICD-10-CM

## 2023-07-02 DIAGNOSIS — Z9049 Acquired absence of other specified parts of digestive tract: Secondary | ICD-10-CM

## 2023-07-02 DIAGNOSIS — I5022 Chronic systolic (congestive) heart failure: Secondary | ICD-10-CM | POA: Insufficient documentation

## 2023-07-02 DIAGNOSIS — I2510 Atherosclerotic heart disease of native coronary artery without angina pectoris: Secondary | ICD-10-CM | POA: Diagnosis present

## 2023-07-02 DIAGNOSIS — J432 Centrilobular emphysema: Secondary | ICD-10-CM | POA: Diagnosis not present

## 2023-07-02 DIAGNOSIS — Z716 Tobacco abuse counseling: Secondary | ICD-10-CM

## 2023-07-02 DIAGNOSIS — Z888 Allergy status to other drugs, medicaments and biological substances status: Secondary | ICD-10-CM

## 2023-07-02 DIAGNOSIS — Z8249 Family history of ischemic heart disease and other diseases of the circulatory system: Secondary | ICD-10-CM

## 2023-07-02 DIAGNOSIS — J479 Bronchiectasis, uncomplicated: Secondary | ICD-10-CM | POA: Diagnosis not present

## 2023-07-02 DIAGNOSIS — I2699 Other pulmonary embolism without acute cor pulmonale: Secondary | ICD-10-CM | POA: Diagnosis not present

## 2023-07-02 DIAGNOSIS — J449 Chronic obstructive pulmonary disease, unspecified: Secondary | ICD-10-CM

## 2023-07-02 HISTORY — DX: Personal history of other venous thrombosis and embolism: Z86.718

## 2023-07-02 HISTORY — DX: Personal history of nicotine dependence: Z87.891

## 2023-07-02 HISTORY — DX: Personal history of other malignant neoplasm of bronchus and lung: Z85.118

## 2023-07-02 HISTORY — DX: Personal history of other malignant neoplasm of large intestine: Z85.038

## 2023-07-02 HISTORY — DX: Personal history of pulmonary embolism: Z86.711

## 2023-07-02 HISTORY — DX: Personal history of malignant neoplasm of prostate: Z85.46

## 2023-07-02 HISTORY — DX: Personal history of other diseases of the circulatory system: Z86.79

## 2023-07-02 LAB — CBC WITH DIFFERENTIAL/PLATELET
Abs Immature Granulocytes: 0.02 10*3/uL (ref 0.00–0.07)
Basophils Absolute: 0 10*3/uL (ref 0.0–0.1)
Basophils Relative: 1 %
Eosinophils Absolute: 0 10*3/uL (ref 0.0–0.5)
Eosinophils Relative: 0 %
HCT: 37.3 % — ABNORMAL LOW (ref 39.0–52.0)
Hemoglobin: 11.7 g/dL — ABNORMAL LOW (ref 13.0–17.0)
Immature Granulocytes: 0 %
Lymphocytes Relative: 19 %
Lymphs Abs: 1.4 10*3/uL (ref 0.7–4.0)
MCH: 29.9 pg (ref 26.0–34.0)
MCHC: 31.4 g/dL (ref 30.0–36.0)
MCV: 95.4 fL (ref 80.0–100.0)
Monocytes Absolute: 0.4 10*3/uL (ref 0.1–1.0)
Monocytes Relative: 5 %
Neutro Abs: 5.8 10*3/uL (ref 1.7–7.7)
Neutrophils Relative %: 75 %
Platelets: 312 10*3/uL (ref 150–400)
RBC: 3.91 MIL/uL — ABNORMAL LOW (ref 4.22–5.81)
RDW: 16.9 % — ABNORMAL HIGH (ref 11.5–15.5)
WBC: 7.7 10*3/uL (ref 4.0–10.5)
nRBC: 0 % (ref 0.0–0.2)

## 2023-07-02 LAB — COMPREHENSIVE METABOLIC PANEL WITH GFR
ALT: 23 U/L (ref 0–44)
AST: 24 U/L (ref 15–41)
Albumin: 2.7 g/dL — ABNORMAL LOW (ref 3.5–5.0)
Alkaline Phosphatase: 102 U/L (ref 38–126)
Anion gap: 12 (ref 5–15)
BUN: 16 mg/dL (ref 8–23)
CO2: 22 mmol/L (ref 22–32)
Calcium: 9.2 mg/dL (ref 8.9–10.3)
Chloride: 108 mmol/L (ref 98–111)
Creatinine, Ser: 0.97 mg/dL (ref 0.61–1.24)
GFR, Estimated: >60 mL/min (ref >60)
Glucose, Bld: 102 mg/dL — ABNORMAL HIGH (ref 70–99)
Potassium: 3.4 mmol/L — ABNORMAL LOW (ref 3.5–5.1)
Sodium: 142 mmol/L (ref 135–145)
Total Bilirubin: 0.9 mg/dL (ref 0.0–1.2)
Total Protein: 6.6 g/dL (ref 6.5–8.1)

## 2023-07-02 LAB — PROTIME-INR
INR: 1.3 — ABNORMAL HIGH (ref 0.8–1.2)
Prothrombin Time: 16.2 s — ABNORMAL HIGH (ref 11.4–15.2)

## 2023-07-02 LAB — LACTIC ACID, PLASMA
Lactic Acid, Venous: 2.5 mmol/L (ref 0.5–1.9)
Lactic Acid, Venous: 4 mmol/L (ref 0.5–1.9)

## 2023-07-02 LAB — I-STAT CG4 LACTIC ACID, ED: Lactic Acid, Venous: 2.2 mmol/L (ref 0.5–1.9)

## 2023-07-02 MED ORDER — APIXABAN 5 MG PO TABS
5.0000 mg | ORAL_TABLET | Freq: Two times a day (BID) | ORAL | Status: DC
  Administered 2023-07-03 – 2023-07-05 (×5): 5 mg via ORAL
  Filled 2023-07-02 (×5): qty 1

## 2023-07-02 MED ORDER — METHYLPREDNISOLONE SODIUM SUCC 40 MG IJ SOLR
40.0000 mg | Freq: Two times a day (BID) | INTRAMUSCULAR | Status: DC
  Administered 2023-07-02: 40 mg via INTRAVENOUS
  Filled 2023-07-02: qty 1

## 2023-07-02 MED ORDER — ROSUVASTATIN CALCIUM 20 MG PO TABS
20.0000 mg | ORAL_TABLET | Freq: Every day | ORAL | Status: DC
  Administered 2023-07-03 – 2023-07-05 (×3): 20 mg via ORAL
  Filled 2023-07-02 (×3): qty 1

## 2023-07-02 MED ORDER — SILDENAFIL CITRATE 20 MG PO TABS
20.0000 mg | ORAL_TABLET | Freq: Three times a day (TID) | ORAL | Status: DC
  Administered 2023-07-03 – 2023-07-05 (×7): 20 mg via ORAL
  Filled 2023-07-02 (×9): qty 1

## 2023-07-02 MED ORDER — ACETAMINOPHEN 650 MG RE SUPP: 650.0000 mg | Freq: Four times a day (QID) | RECTAL | Status: DC | PRN

## 2023-07-02 MED ORDER — IOHEXOL 350 MG/ML SOLN
75.0000 mL | Freq: Once | INTRAVENOUS | Status: AC | PRN
  Administered 2023-07-02: 75 mL via INTRAVENOUS

## 2023-07-02 MED ORDER — VANCOMYCIN HCL 1500 MG/300ML IV SOLN
1500.0000 mg | Freq: Once | INTRAVENOUS | Status: AC
  Administered 2023-07-03: 1500 mg via INTRAVENOUS
  Filled 2023-07-02: qty 300

## 2023-07-02 MED ORDER — PANTOPRAZOLE SODIUM 40 MG PO TBEC
40.0000 mg | DELAYED_RELEASE_TABLET | Freq: Every day | ORAL | Status: DC
  Administered 2023-07-03 – 2023-07-05 (×3): 40 mg via ORAL
  Filled 2023-07-02 (×3): qty 1

## 2023-07-02 MED ORDER — SODIUM CHLORIDE 0.9% FLUSH
3.0000 mL | Freq: Two times a day (BID) | INTRAVENOUS | Status: DC
  Administered 2023-07-03 – 2023-07-05 (×5): 3 mL via INTRAVENOUS

## 2023-07-02 MED ORDER — ALBUMIN HUMAN 25 % IV SOLN
25.0000 g | INTRAVENOUS | Status: AC
  Administered 2023-07-03: 25 g via INTRAVENOUS
  Filled 2023-07-02: qty 100

## 2023-07-02 MED ORDER — BISOPROLOL FUMARATE 5 MG PO TABS
2.5000 mg | ORAL_TABLET | Freq: Every day | ORAL | Status: DC
  Administered 2023-07-03 – 2023-07-05 (×3): 2.5 mg via ORAL
  Filled 2023-07-02: qty 1
  Filled 2023-07-02: qty 0.5
  Filled 2023-07-02: qty 1

## 2023-07-02 MED ORDER — SODIUM CHLORIDE 0.9 % IV SOLN
2.0000 g | Freq: Three times a day (TID) | INTRAVENOUS | Status: DC
  Administered 2023-07-03 – 2023-07-04 (×5): 2 g via INTRAVENOUS
  Filled 2023-07-02 (×5): qty 12.5

## 2023-07-02 MED ORDER — ACETAMINOPHEN 325 MG PO TABS: 650.0000 mg | ORAL_TABLET | Freq: Four times a day (QID) | ORAL | Status: DC | PRN

## 2023-07-02 MED ORDER — MAGNESIUM SULFATE 2 GM/50ML IV SOLN
2.0000 g | Freq: Once | INTRAVENOUS | Status: AC
  Administered 2023-07-02: 2 g via INTRAVENOUS
  Filled 2023-07-02: qty 50

## 2023-07-02 MED ORDER — LORATADINE 10 MG PO TABS
10.0000 mg | ORAL_TABLET | Freq: Every day | ORAL | Status: DC
  Administered 2023-07-03 – 2023-07-05 (×3): 10 mg via ORAL
  Filled 2023-07-02 (×3): qty 1

## 2023-07-02 MED ORDER — SODIUM CHLORIDE 0.9 % IV BOLUS
1000.0000 mL | Freq: Once | INTRAVENOUS | Status: AC
  Administered 2023-07-02: 1000 mL via INTRAVENOUS

## 2023-07-02 MED ORDER — LACTATED RINGERS IV BOLUS: 500.0000 mL | Freq: Once | INTRAVENOUS | Status: DC

## 2023-07-02 MED ORDER — GABAPENTIN 100 MG PO CAPS
100.0000 mg | ORAL_CAPSULE | Freq: Three times a day (TID) | ORAL | Status: DC
  Administered 2023-07-02 – 2023-07-05 (×8): 100 mg via ORAL
  Filled 2023-07-02 (×8): qty 1

## 2023-07-02 MED ORDER — BUDESONIDE 3 MG PO CPEP
3.0000 mg | ORAL_CAPSULE | Freq: Two times a day (BID) | ORAL | Status: DC
  Administered 2023-07-03 – 2023-07-05 (×6): 3 mg via ORAL
  Filled 2023-07-02 (×9): qty 1

## 2023-07-02 MED ORDER — SODIUM CHLORIDE 0.9 % IV SOLN: INTRAVENOUS | Status: DC

## 2023-07-02 MED ORDER — SODIUM CHLORIDE 0.9 % IV SOLN: 250.0000 mL | INTRAVENOUS | Status: AC | PRN

## 2023-07-02 MED ORDER — SODIUM CHLORIDE 0.9 % IV BOLUS
250.0000 mL | Freq: Once | INTRAVENOUS | Status: AC
  Administered 2023-07-03: 250 mL via INTRAVENOUS

## 2023-07-02 MED ORDER — SODIUM CHLORIDE 0.9% FLUSH
3.0000 mL | Freq: Two times a day (BID) | INTRAVENOUS | Status: DC
  Administered 2023-07-03 – 2023-07-05 (×6): 3 mL via INTRAVENOUS

## 2023-07-02 MED ORDER — ROPINIROLE HCL 1 MG PO TABS
1.0000 mg | ORAL_TABLET | Freq: Two times a day (BID) | ORAL | Status: DC
  Administered 2023-07-02 – 2023-07-05 (×6): 1 mg via ORAL
  Filled 2023-07-02 (×6): qty 1

## 2023-07-02 MED ORDER — SODIUM CHLORIDE 0.9% FLUSH: 3.0000 mL | INTRAVENOUS | Status: DC | PRN

## 2023-07-02 MED ORDER — PROCHLORPERAZINE EDISYLATE 10 MG/2ML IJ SOLN: 10.0000 mg | Freq: Four times a day (QID) | INTRAMUSCULAR | Status: DC | PRN

## 2023-07-02 MED ORDER — ENSURE ENLIVE PO LIQD
237.0000 mL | Freq: Two times a day (BID) | ORAL | Status: DC
  Administered 2023-07-03 – 2023-07-05 (×3): 237 mL via ORAL
  Filled 2023-07-02: qty 237

## 2023-07-02 MED ORDER — ALBUTEROL SULFATE (2.5 MG/3ML) 0.083% IN NEBU
5.0000 mg | INHALATION_SOLUTION | Freq: Once | RESPIRATORY_TRACT | Status: AC
  Administered 2023-07-02: 5 mg via RESPIRATORY_TRACT
  Filled 2023-07-02: qty 6

## 2023-07-02 MED ORDER — FUROSEMIDE 20 MG PO TABS: 40.0000 mg | ORAL_TABLET | Freq: Every day | ORAL | Status: DC

## 2023-07-02 NOTE — H&P (Incomplete)
 History and Physical    Bruce [name] RUE:[id number] DOB: 10-May-1945 DOA: 07/02/2023  PCP: Trenda Frisk, FNP   Patient coming from: Home   Chief Complaint:  Chief Complaint  Patient presents with   Shortness of Breath   ED TRIAGE note:PT BIB [facility] from a doctors office for SOB/sepsis concerns.  He was following up on lung infection since March.  Has had full round of ABX.  89% only at PCP.  Doctor sent pt for chest xray.  Meets Sepsis criteria for [facility].   EMS gave atrovent, albuterol , 125 solumedrol, 200mL NS  20G L. Wrist   127/82 96% HR 100 RR 38 ETC02 20 CBG 153  HPI:  Bruce [name] is a 78 y.o. 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  Patient has been referred from infectious disease clinic as he found to have respiratory distress/shortness of breath as compared to his baseline.  Due to advanced heart failure and low blood pressure at the baseline also patient has history of chronic noncompliance to most of the medication patient has been recently switched to Coreg  to bisoprolol .  Due to the soft blood pressure with advanced heart failure currently patient is of is off losartan , digoxin , spironolactone .    Patient has been recently admitted underwent thoracentesis 3/27.  Culture growing strep pneumoniae.  Patient was seen by pulmonary ID.  During this hospitalization patient refused CT and was discharged on 4/2 to complete 4 weeks course of p.o. Augmentin .  Patient presented today to see infectious disease clinic for he has been requiring 6 L of oxygen .  With baseline uses 4 to 5 L oxygen .  EMS was called as concern for hypoxia.  Due to my evaluation at the bedside patient is complaining about persistent  shortness of breath however since nebulizer treatment reported it has been improved quite a lot.  Complaining about productive cough.  Denies any fever fever, chill, chest pain, palpitation, headache, abdominal pain, nausea/vomiting, diarrhea and constipation.   ED Course:  At presentation to ED patient is hemodynamically stable.  O2 sat 93 to 97% on 6 L oxygen . Elevated lactic acid 2.2 trended up to 2.5. Blood cultures are in process. Elevated pro time INR 16.2 and 1.3. CBC no evidence of leukocytosis stable H&H normal platelet count. CMP showing low potassium 3.4 low albumin  2.7.  EKG showing sinus rhythm heart rate 99.  CTA chest: 1. No evidence of acute pulmonary embolism. 2. 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 The appearance of the pulmonary arteries is similar to previous CTA's. 3. Interval decreased volume of loculated right pleural effusion with less associated pleural enhancement. New small dependent left pleural effusion. 4. Stable small lingular nodule. 5. Aortic Atherosclerosis (ICD10-I70.0) and Emphysema (ICD10-J43.9).  Chest x-ray showed near complete resolution of previously demonstrated right pleural effusion. No evidence of acute cardiopulmonary process. In the ED has been given 1 L of NS bolus.  In route to ED patient has been given Atrovent, albuterol , Solu-Medrol  and 200 mL of NS.  Due to patient's advanced heart failure cannot resuscitate with further fluid.  However patient already got 1.2 L of NS bolus  so far. Patient still having significant shortness of breath. Patient also reported he does not have any home oxygen  set up yet.  Hospitalist has been consulted for further evaluation and management of COPD exacerbation and sepsis   Significant labs in the ED: Lab Orders         Blood  Culture (routine x 2)         Respiratory (~20 pathogens) panel by PCR         Urine Culture (for pregnant, neutropenic or urologic patients or patients with an indwelling urinary catheter)         Expectorated Sputum Assessment w Gram Stain, Rflx to Resp Cult         Comprehensive metabolic panel         CBC with Differential         Protime-INR         Lactic acid, plasma         Comprehensive metabolic panel         CBC         Urinalysis, Routine w reflex microscopic -Urine, Clean Catch         Lactic acid, plasma         I-Stat Lactic Acid, ED       Review of Systems:  Review of Systems  Constitutional:  Negative for chills, fever, malaise/fatigue and weight loss.  Respiratory:  Positive for cough, sputum production, shortness of breath and wheezing.   Cardiovascular:  Negative for chest pain and palpitations.  Gastrointestinal:  Negative for abdominal pain, diarrhea, heartburn, nausea and vomiting.  Genitourinary:  Negative for dysuria, frequency, hematuria and urgency.  Musculoskeletal:  Negative for back pain, falls, joint pain, myalgias and neck pain.  Skin:  Negative for itching and rash.  Neurological:  Negative for dizziness and headaches.  Psychiatric/Behavioral:  The patient is not nervous/anxious.     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 01/01/2015   Complication of anesthesia    wake up with a head ache   COPD (chronic obstructive pulmonary disease) (HCC)    Coronary artery disease    Edema 08/16/2010   GERD (gastroesophageal reflux disease)     Headache    related to sinus congestion   History of blood transfusion    History of kidney stones    History of MI (myocardial infarction) 10/08/2013   Hypertension    Hypokalemia 04/09/2022   Intracranial bleed (HCC) 12/31/2018   Lactic acidosis 04/09/2022   Leg cramps 08/16/2010   Malignant neoplasm of prostate (HCC) 07/13/2016   Multiple closed facial bone fractures (HCC)    Myocardial infarction [facility])    '09 AND '12   Peripheral vascular disease (HCC)    Shortness of breath    With exertion .   Tibia/fibula fracture 10/06/2013   Tibial plateau fracture 12/29/2014   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HCC) 10/08/2013   UTI (urinary tract infection)    frequent UTI   Visual hallucination 03/18/2015  Past Surgical History:  Procedure Laterality Date   CARDIAC CATHETERIZATION     CHOLECYSTECTOMY     EXTERNAL FIXATION LEG Right 10/06/2013   Procedure: CLOSED REDUCTION RIGHT TIBIAL PLATEAU FRACTURE, EXTERNAL FIXATION RIGHT LEG, PLACEMENT OF WOUND VAC;  Surgeon: Arlette Lagos, MD;  Location: [facility] OR;  Service: Orthopedics;  Laterality: Right;   FEMORAL-POPLITEAL BYPASS GRAFT Right 10/06/2013   Procedure: RIGHT POPLITEAL-POPLITEAL ARTERY BYPASS GRAFT;  Surgeon: Mayo Speck, MD;  Location[facility];  Service: Vascular;  Laterality: Right;   FRACTURE SURGERY Right 2014   HARDWARE REMOVAL Right 12/02/2013   Procedure: REMOVAL EXTERNAL FIXATION RIGHT LEG ;  Surgeon: Arlette Lagos, MD;  Location: [facility] OR;  Service: Orthopedics;  Laterality: Right;   HERNIA REPAIR Right 1990's   I & D EXTREMITY Right 10/09/2013   Procedure: IRRIGATION AND DEBRIDEMENT RIGHT LEG, CLOSURE  OF WOUNDS, PLACEMENT OF WOUND VAC ON EACH SIDE OF LEG;  Surgeon: Arlette Lagos, MD;  Location: [facility] OR;  Service: Orthopedics;  Laterality: Right;   IR ANGIOGRAM PULMONARY RIGHT SELECTIVE  04/10/2022   IR ANGIOGRAM SELECTIVE EACH ADDITIONAL VESSEL  04/10/2022   IR NEPHROSTOMY PLACEMENT LEFT  06/19/2016   IR THORACENTESIS ASP PLEURAL  SPACE W/IMG GUIDE  05/31/2023   IR THROMBECT PRIM MECH INIT (INCLU) MOD SED  04/10/2022   IR US  GUIDE VASC ACCESS RIGHT  04/10/2022   IVC filter  2009   placed @ [facility]/ Removed in 2010.   IVC Filter Removed     LEFT HEART CATH AND CORONARY ANGIOGRAPHY Right 10/11/2018   Procedure: LEFT HEART CATH AND CORONARY ANGIOGRAPHY;  Surgeon: Cherrie Cornwall, MD;  Location: [facility] INVASIVE CV LAB;  Service: Cardiovascular;  Laterality: Right;   ligament leg Left    NEPHROLITHOTOMY Left 06/19/2016   Procedure: NEPHROLITHOTOMY PERCUTANEOUS;  Surgeon: Dustin Gimenez, MD;  Location: [facility] ORS;  Service: Urology;  Laterality: Left;   ORIF TIBIA FRACTURE Right 12/29/2014   Procedure: OPEN REDUCTION INTERNAL FIXATION (ORIF) RIGHT TIBIA FRACTURE, RIA VS ICBG;  Surgeon: Hardy Lia, MD;  Location: [facility] OR;  Service: Orthopedics;  Laterality: Right;   RADIOACTIVE SEED IMPLANT N/A 09/12/2016   Procedure: RADIOACTIVE SEED IMPLANT/BRACHYTHERAPY IMPLANT;  Surgeon: Dustin Gimenez, MD;  Location: [facility] ORS;  Service: Urology;  Laterality: N/A;   RIGHT/LEFT HEART CATH AND CORONARY ANGIOGRAPHY N/A 04/13/2022   Procedure: RIGHT/LEFT HEART CATH AND CORONARY ANGIOGRAPHY;  Surgeon: Mardell Shade, MD;  Location: [facility] INVASIVE CV LAB;  Service: Cardiovascular;  Laterality: N/A;     reports that he quit smoking about a year ago. His smoking use included cigarettes. He started smoking about 53 years ago. He has a 26 pack-year smoking history. He has never used smokeless tobacco. He reports that he does not drink alcohol and does not use drugs.  Allergies  Allergen Reactions   Adhesive [Tape] Other (See Comments)    After right leg fracture surgery, pt developed a large blister where tape was applied to his right leg. OK to use paper tape.   Eggs-Apples-Oats [Alitraq] Other (See Comments)    Caused chest pains   Imdur  [Isosorbide  Dinitrate] Other (See Comments)    hallucinations   Singulair  [Montelukast  Sodium] Other (See Comments)     Hallucinations     Family History  Problem Relation Age of Onset   Hypertension Mother    Prostate cancer Brother    Mental illness Neg Hx     Prior to Admission medications   Medication Sig Start Date End  Date Taking? Authorizing Provider  acetaminophen  (TYLENOL ) 500 MG tablet Take 2 tablets (1,000 mg total) by mouth every 6 (six) hours as needed for mild pain (pain score 1-3), moderate pain (pain score 4-6), fever or headache. Patient not taking: Reported on 07/02/2023 04/24/23   Malen Scudder, DO  albuterol  (VENTOLIN  HFA) 108 [phone] Base) MCG/ACT inhaler TAKE TWO PUFFS BY MOUTH AS NEEDED FOR WHEEZING EVERY 4-6 HOURS 11/10/22   Cherrie Cornwall, MD  ALPRAZolam  (XANAX ) 0.5 MG tablet Take 1 tablet (0.5 mg total) by mouth 2 (two) times daily as needed for anxiety. 05/02/23   Trenda Frisk, FNP  apixaban  (ELIQUIS ) 5 MG TABS tablet TAKE ONE TABLET (5 MG TOTAL) BY MOUTH TWO (TWO) TIMES DAILY. 09/26/22   Bensimhon, Rheta Celestine, MD  bisoprolol  (ZEBETA ) 5 MG tablet Take 0.5 tablets (2.5 mg total) by mouth daily. 06/14/23   Sheryl Donna, NP  budesonide  (PULMICORT ) 0.25 MG/2ML nebulizer solution Take 2 mLs (0.25 mg total) by nebulization 2 (two) times daily. 04/24/23   Malen Scudder, DO  cetirizine  (ZYRTEC ) 10 MG tablet Take 1 tablet (10 mg total) by mouth daily. 05/12/22   Glendale Landmark, NP  cholecalciferol  (VITAMIN D3) 25 MCG (1000 UNIT) tablet Take 1,000 Units by mouth in the morning.    [provider]  Elastic Bandages & Supports (MEDICAL COMPRESSION STOCKINGS) MISC Wear daily as needed 05/18/23   Trenda Frisk, FNP  FARXIGA  10 MG TABS tablet TAKE ONE TABLET (10 MG TOTAL) BY MOUTH DAILY. 12/15/22   Milford, Arlice Bene, FNP  feeding supplement (ENSURE ENLIVE / ENSURE PLUS) LIQD Take 237 mLs by mouth 2 (two) times daily between meals. 06/06/23   Kraig Peru, MD  fluticasone  (FLONASE ) 50 MCG/ACT nasal spray Place 1 spray into both nostrils daily as needed for allergies. 03/26/23   [provider]  furosemide  (LASIX ) 20 MG tablet Take 2 tablets (40 mg total) by mouth daily. 05/23/23 05/22/24  Elmarie Hacking, FNP  gabapentin  (NEURONTIN ) 100 MG capsule TAKE ONE CAPSULE (100 MG TOTAL) BY MOUTH THREE (THREE) TIMES DAILY. 06/28/23   Trenda Frisk, FNP  lidocaine  (LIDODERM ) 5 % Place 1 patch onto the skin daily. Remove & Discard patch within 12 hours or as directed by MD 05/12/22   Boswell, Chelsa, NP  Multiple Vitamin (MULTIVITAMIN) tablet Take 1 tablet by mouth in the morning. For Men    [provider]  Omega-3 Fatty Acids (FISH OIL PO) Take 1,000 mg by mouth every evening.    [provider]  omeprazole  (PRILOSEC) 40 MG capsule Take 1 capsule (40 mg total) by mouth 2 (two) times daily. 05/02/23   Trenda Frisk, FNP  OXYGEN  Inhale 3 L/min into the lungs continuous.    [provider]  oxymetazoline  (AFRIN) 0.05 % nasal spray Place 1 spray into both nostrils 2 (two) times daily as needed (nose bleed). 06/06/23   Kraig Peru, MD  rOPINIRole  (REQUIP ) 1 MG tablet TAKE ONE TABLET (1 MG TOTAL) BY MOUTH IN THE MORNING AND AT BEDTIME. 05/23/23   Trenda Frisk, FNP  rosuvastatin  (CRESTOR ) 20 MG tablet Take 1 tablet (20 mg total) by mouth daily. 04/24/23   Malen Scudder, DO  sildenafil  (REVATIO ) 20 MG tablet Take 1 tablet (20 mg total) by mouth 3 (three) times daily. 01/15/23   Milford, Arlice Bene, FNP  Tiotropium Bromide-Olodaterol (STIOLTO RESPIMAT ) 2.5-2.5 MCG/ACT AERS Inhale 2 puffs into the lungs daily. 04/24/23 04/23/24  Malen Scudder, DO  Wound  Cleansers (VASHE WOUND) 0.033 % SOLN Apply 1 Act topically daily at 12 noon. 06/06/23   Kraig Peru, MD     Physical Exam: Vitals:   07/03/23 0330 07/03/23 0400 07/03/23 0430 07/03/23 0450  BP: 120/77 120/79 114/85   Pulse: 85 80 74   Resp: 10 15 15    Temp:    97.9 F (36.6 C)  TempSrc:    Oral  SpO2: 100% 99% 99%     Physical Exam Vitals and nursing note reviewed.  Constitutional:      General:  He is not in acute distress.    Appearance: He is ill-appearing. He is not toxic-appearing or diaphoretic.     Interventions: He is intubated.  Cardiovascular:     Rate and Rhythm: Normal rate and regular rhythm.  Pulmonary:     Effort: Pulmonary effort is normal. No respiratory distress. He is intubated.     Breath sounds: No stridor. Examination of the right-upper field reveals wheezing. Examination of the left-upper field reveals wheezing. Examination of the right-middle field reveals wheezing. Examination of the left-middle field reveals wheezing. Wheezing present. No decreased breath sounds, rhonchi or rales.  Skin:    General: Skin is dry.     Capillary Refill: Capillary refill takes less than 2 seconds.  Neurological:     Mental Status: He is alert and oriented to person, place, and time.  Psychiatric:        Mood and Affect: Mood normal. Mood is not anxious.      Labs on Admission: I have personally reviewed following labs and imaging studies  CBC: Recent Labs  Lab 07/02/23 1204 07/03/23 0513  WBC 7.7 6.3  NEUTROABS 5.8  --   HGB 11.7* 9.8*  HCT 37.3* 30.6*  MCV 95.4 93.6  PLT 312 231   Basic Metabolic Panel: Recent Labs  Lab 07/02/23 1204 07/03/23 0513  NA 142 138  K 3.4* 3.6  CL 108 108  CO2 22 21*  GLUCOSE 102* 165*  BUN 16 13  CREATININE 0.97 0.80  CALCIUM  9.2 8.3*   GFR: Estimated Creatinine Clearance: 75.3 mL/min (by C-G formula based on SCr of 0.8 mg/dL). Liver Function Tests: Recent Labs  Lab 07/02/23 1204 07/03/23 0513  AST 24 19  ALT 23 19  ALKPHOS 102 79  BILITOT 0.9 0.7  PROT 6.6 6.1*  ALBUMIN  2.7* 2.7*   No results for input(s): "LIPASE", "AMYLASE" in the last 168 hours. No results for input(s): "AMMONIA" in the last 168 hours. Coagulation Profile: Recent Labs  Lab 07/02/23 1204  INR 1.3*   Cardiac Enzymes: No results for input(s): "CKTOTAL", "CKMB", "CKMBINDEX", "TROPONINI", "TROPONINIHS" in the last 168 hours. BNP (last 3  results) Recent Labs    04/18/23 1139 05/24/23 1316 06/14/23 1455  BNP 1,456.0* 2,431.6* 994.7*   HbA1C: No results for input(s): "HGBA1C" in the last 72 hours. CBG: No results for input(s): "GLUCAP" in the last 168 hours. Lipid Profile: No results for input(s): "CHOL", "HDL", "LDLCALC", "TRIG", "CHOLHDL", "LDLDIRECT" in the last 72 hours. Thyroid  Function Tests: No results for input(s): "TSH", "T4TOTAL", "FREET4", "T3FREE", "THYROIDAB" in the last 72 hours. Anemia Panel: No results for input(s): "VITAMINB12", "FOLATE", "FERRITIN", "TIBC", "IRON", "RETICCTPCT" in the last 72 hours. Urine analysis:    Component Value Date/Time   COLORURINE YELLOW 07/02/2023 0055   APPEARANCEUR CLEAR 07/02/2023 0055   APPEARANCEUR Clear 04/10/2016 1035   LABSPEC 1.017 07/02/2023 0055   PHURINE 6.0 07/02/2023 0055   GLUCOSEU 150 (A) 07/02/2023 0055  HGBUR NEGATIVE 07/02/2023 0055   BILIRUBINUR NEGATIVE 07/02/2023 0055   BILIRUBINUR Negative 04/10/2016 1035   KETONESUR NEGATIVE 07/02/2023 0055   PROTEINUR NEGATIVE 07/02/2023 0055   NITRITE NEGATIVE 07/02/2023 0055   LEUKOCYTESUR NEGATIVE 07/02/2023 0055    Radiological Exams on Admission: I have personally reviewed images CT Angio Chest PE W and/or Wo Contrast Result Date: 07/02/2023 CLINICAL DATA:  Pulmonary embolism suspected, high probability. History of sub massive pulmonary embolism. Re-evaluation of prior empyema collection, previously drained. New hypoxia. EXAM: CT ANGIOGRAPHY CHEST WITH CONTRAST TECHNIQUE: Multidetector CT imaging of the chest was performed using the standard protocol during bolus administration of intravenous contrast. Multiplanar CT image reconstructions and MIPs were obtained to evaluate the vascular anatomy. RADIATION DOSE REDUCTION: This exam was performed according to the departmental dose-optimization program which includes automated exposure control, adjustment of the mA and/or kV according to patient size and/or  use of iterative reconstruction technique. CONTRAST:  75mL OMNIPAQUE  IOHEXOL  350 MG/ML SOLN COMPARISON:  Radiographs 07/02/2023 and 06/04/2023. Chest CTA 05/24/2023 and 07/24/2022. FINDINGS: Cardiovascular: The pulmonary arteries are well opacified with contrast to the level of the segmental branches. 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 The right upper lobe pulmonary artery remains patent. There is also mild chronic eccentric nonocclusive thrombus within segmental branches of the left upper and lower lobes. No acute pulmonary emboli are demonstrated. Atherosclerosis of the aorta, great vessels and coronary arteries. No acute systemic arterial abnormalities are identified. The heart size is normal. Small pericardial effusion appears unchanged. Mediastinum/Nodes: There are no enlarged mediastinal, hilar or axillary lymph nodes. The thyroid  gland, trachea and esophagus demonstrate no significant findings. Lungs/Pleura: Previously demonstrated loculated right pleural effusion has decreased in volume and demonstrates less associated pleural enhancement. There is a new small dependent left pleural effusion. No pneumothorax. Interval improved aeration of the right lower lobe. There is chronic central airway thickening with scattered scarring and mild centrilobular emphysema. A 5 mm lingular nodule on image 97/6 is unchanged. No new or enlarging pulmonary nodules. Upper abdomen: No acute findings are seen in the visualized upper abdomen. Musculoskeletal/Chest wall: There is no chest wall mass or suspicious osseous finding. Mild spondylosis. Review of the MIP images confirms the above findings. IMPRESSION: 1. No evidence of acute pulmonary embolism. 2. 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 The appearance of the pulmonary arteries is similar to previous CTA's. 3. Interval decreased volume of loculated right pleural effusion with less associated pleural enhancement. New small dependent left pleural effusion. 4. Stable small lingular nodule. 5. Aortic Atherosclerosis (ICD10-I70.0) and Emphysema (ICD10-J43.9). Electronically Signed   By: Elmon Hagedorn M.D.   On: 07/02/2023 16:26   DG Chest Port 1 View Result Date: 07/02/2023 CLINICAL DATA:  Questionable sepsis - evaluate for abnormality EXAM: PORTABLE CHEST 1 VIEW COMPARISON:  Radiographs 06/04/2023 and 05/31/2023.  CT 05/24/2023. FINDINGS: 1218 hours. Two views submitted. The heart size and mediastinal contours are stable. Previously demonstrated right pleural effusion has nearly completely resolved. Scattered scarring in both lungs appears unchanged. No confluent airspace disease or pneumothorax. The bones appear unchanged. Telemetry leads overlie the chest. IMPRESSION: Near complete resolution of previously demonstrated right pleural effusion. No evidence of acute cardiopulmonary process. Electronically Signed  By: Elmon Hagedorn M.D.   On: 07/02/2023 13:18     EKG: My personal interpretation of EKG shows: EKG showing normal sinus rhythm heart rate 99    Assessment/Plan: Principal Problem:   Acute exacerbation of chronic obstructive pulmonary disease (COPD) (HCC) Active Problems:   Hypokalemia   Severe sepsis (HCC)   Paroxysmal atrial fibrillation (HCC)   Pulmonary hypertension (HCC)   Peripheral artery disease (HCC)   Chronic heart failure with reduced ejection fraction (HFrEF, <= 40%) (HCC)   History of CAD (coronary artery disease)   Chronic hypoxic respiratory failure (HCC)   History of pulmonary embolism   History of DVT (deep vein thrombosis)   History of cigarette smoking   History of lung cancer   History of colon cancer    History of prostate cancer   COPD with acute exacerbation (HCC)    Assessment and Plan: Acute on chronic COPD exacerbation Chronic hypoxic respiratory failure on 4 to 5 L oxygen  at baseline -Patient presented to emergency department worsening shortness of breath and cough.  Endowed to ED patient has been treated with Solu-Medrol  and DuoNeb nebulizer. -In the ED patient found to be tachycardic, requiring oxygen  more than baseline.  Blood pressure within good range however lactic acid continues to trending up 2.2 to 4. - CTA chest chronic occlusion of the right lower and middle lobe pulmonary arteries with chronic mural thrombosis.  Improvement of right-sided pleural effusion however there is a small left-sided pleural effusion now. -Admitting patient for management of acute on chronic COPD exacerbation. -Continue DuoNeb every 6 hours scheduled, budesonide  twice daily, prednisone  40 mg twice daily and albuterol  as needed for wheezing shortness of breath. - Continue supplemental oxygen  at baseline patient uses 4 to 5 L oxygen . -Patient reported need to arrangement of for portable oxygen . Consulted case management.  Severe sepsis left-sided parapneumonic effusion vs unknown source of infection at this time - Presentation to ED patient found to be borderline hypotensive and hypoxic.  No leukocytosis.  Afebrile.  Recently hospitalized for management for pneumonia and parapneumonic effusion.   -CTA chest showed improved right-sided pleural effusion however there is new left-sided pleural effusion. - Chest x-ray unremarkable. -Pending respiratory panel and blood culture. - Elevated lactic acid 2.4 which has been trended up to 4.  In the ED patient has been resuscitated with 1.2 L of IV bolus however given patient has CHF reduced EF 20 to 25% limiting further fluid resuscitation.  In the setting of significant lactic acidosis will do panculture. - Will check blood culture, sputum culture, UA and urine  culture -Starting broad-spectrum antibiotic with IV vancomycin  and cefepime with pharmacy consult.  Need to follow-up with culture result for appropriate antibiotic guidance. -Continue maintenance fluid NS 50 cc/h.  Hypokalemia - Low potassium 3.4.  Repleted with oral KCl  Paroxysmal atrial fibrillation -EKG showing sinus rhythm rate controlled 99 - Continue Eliquis  5 mg twice daily and bisoprolol  2.5 mg daily  Chronic pulmonary embolism -Continue Eliquis   History of DVT -Continue Eliquis   History of CAD -Continue bisoprolol  Crestor .    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  Due to history of recurrent UTI patient has been off Jardiance. - Continue bisoprolol  2.5 mg daily. -Setting of sepsis holding Lasix .  Pulmonary hypertension - Continue sildenafil   History of lung cancer History of colon cancer History of prostate cancer -Per chart  review history of previous prostate cancer in 2020 status post seed implantation. - Looked up care everywhere and tracked back up to 2008 unable to find further record of lung and colon cancer treatment and oncology note.  Patient is not a reliable historian.    DVT prophylaxis:  Eliquis  Code Status:  Full Code Diet: Heart healthy diet fluid restriction 1.5 L/day and salt restriction 2 g/day Family Communication: Currently no family member at bedside Disposition Plan: Need to follow-up with BC/UA/UC/sputum culture results for appropriate antibiotic guidance. Consults: None at this time Admission status:   Inpatient, Telemetry bed  Severity of Illness:  The appropriate patient status for this patient is INPATIENT. Inpatient status is judged to be reasonable and necessary in order to provide the required intensity of service to ensure the patient's safety. The patient's  presenting symptoms, physical exam findings, and initial radiographic and laboratory data in the context of their chronic comorbidities is felt to place them at high risk for further clinical deterioration. Furthermore, it is not anticipated that the patient will be medically stable for discharge from the hospital within 2 midnights of admission.    * I certify that at the point of admission it is my clinical judgment that the patient will require inpatient hospital care spanning beyond 2 midnights from the point of admission due to high intensity of service, high risk for further deterioration and high frequency of surveillance required.Aaron Aas   Ralonda Tartt, MD [facility]  How to contact the [facility] Attending or Consulting provider 7A - 7P or covering provider during after hours 7P -7A, for this patient.  Check the care team in [facility] and look for a) attending/consulting TR[facility] provider listed and b) the TR[facility] team listed Log into [email or web address] and use [place]'s universal password to access. If you do not have the password, please contact the hospital operator. Locate the [facility] provider you are looking for under [facility] and page to a number that you can be directly reached. If you still have difficulty reaching the provider, please page the [facility] (Director on Call) for the Hospitalists listed on amion for assistance.  07/03/2023, 6:49 AM

## 2023-07-02 NOTE — Progress Notes (Unsigned)
 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   Pleural effusion, right 05/24/2023   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   Pulmonary embolism on right (HCC) 05/24/2023   Neuropathy 05/19/2023   Acute on chronic respiratory failure with hypoxia (HCC) 04/19/2023   Acute decompensated heart failure (HCC) 04/18/2023   Prolonged Q-T interval on ECG 04/18/2023   Influenza A 04/18/2023   Cor pulmonale (chronic) (HCC) 04/18/2023   Intermittent claudication (HCC) 12/18/2022   B12 deficiency 12/18/2022   MDD (major depressive disorder), recurrent episode, moderate (HCC) 09/01/2022   Pulmonary hypertension, unspecified (HCC) 04/14/2022   Acute on chronic combined systolic and diastolic CHF (congestive heart failure) (HCC) 04/13/2022   Pulmonary embolism (HCC) 04/09/2022   PAF (paroxysmal atrial fibrillation) (HCC)    NSTEMI (non-ST elevated myocardial infarction) (HCC) 10/10/2018   Syncope 04/28/2018   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   Limb ischemia 11/18/2013   Acute blood loss anemia 10/08/2013   Anxiety disorder 10/08/2013   Dyslipidemia 10/08/2013   GERD (gastroesophageal reflux disease) 10/08/2013   Anticoagulated 10/08/2013   Hypertension    CAD (coronary artery disease) 08/16/2010   COPD (chronic obstructive pulmonary disease) (HCC) 08/16/2010   DVT of leg (deep venous thrombosis) (HCC) 08/16/2010    Patient's Medications  New Prescriptions   No medications on file  Previous Medications   ACETAMINOPHEN  (TYLENOL ) 500 MG TABLET    Take 2 tablets (1,000 mg total) by mouth every 6 (six) hours as needed for mild pain (pain score 1-3), moderate pain (pain score 4-6), fever or headache.   ALBUTEROL  (VENTOLIN  HFA) 108 (90 BASE)  MCG/ACT INHALER    TAKE TWO PUFFS BY MOUTH AS NEEDED FOR WHEEZING EVERY 4-6 HOURS   ALPRAZOLAM  (XANAX ) 0.5 MG TABLET    Take 1 tablet (0.5 mg total) by mouth 2 (two) times daily as needed for anxiety.   APIXABAN  (ELIQUIS ) 5 MG TABS TABLET    TAKE ONE TABLET (5 MG TOTAL) BY MOUTH TWO (TWO) TIMES DAILY.   BISOPROLOL  (ZEBETA ) 5 MG TABLET    Take 0.5 tablets (2.5 mg total) by mouth daily.   BUDESONIDE  (PULMICORT ) 0.25 MG/2ML NEBULIZER SOLUTION    Take 2 mLs (0.25 mg total) by nebulization 2 (two) times daily.   CETIRIZINE  (ZYRTEC ) 10 MG TABLET    Take 1 tablet (10 mg total) by mouth daily.   CHOLECALCIFEROL  (VITAMIN D3) 25 MCG (1000 UNIT) TABLET    Take 1,000 Units by mouth in the morning.   ELASTIC BANDAGES & SUPPORTS (MEDICAL COMPRESSION STOCKINGS) MISC    Wear daily as needed   FARXIGA  10 MG TABS TABLET    TAKE ONE TABLET (10 MG TOTAL) BY MOUTH DAILY.   FEEDING SUPPLEMENT (ENSURE ENLIVE / ENSURE PLUS) LIQD    Take 237 mLs by mouth 2 (two) times daily between meals.   FLUTICASONE  (FLONASE ) 50 MCG/ACT NASAL SPRAY    Place 1 spray into both nostrils daily as needed for allergies.   FUROSEMIDE  (LASIX ) 20 MG TABLET    Take 2 tablets (40 mg total) by mouth daily.   GABAPENTIN  (NEURONTIN ) 100 MG CAPSULE    TAKE ONE CAPSULE (100 MG TOTAL) BY MOUTH THREE (THREE) TIMES DAILY.   LIDOCAINE  (LIDODERM ) 5 %    Place 1 patch onto the skin daily. Remove & Discard  patch within 12 hours or as directed by MD   MULTIPLE VITAMIN (MULTIVITAMIN) TABLET    Take 1 tablet by mouth in the morning. For Men   OMEGA-3 FATTY ACIDS (FISH OIL PO)    Take 1,000 mg by mouth every evening.   OMEPRAZOLE  (PRILOSEC) 40 MG CAPSULE    Take 1 capsule (40 mg total) by mouth 2 (two) times daily.   OXYGEN     Inhale 3 L/min into the lungs continuous.   OXYMETAZOLINE  (AFRIN) 0.05 % NASAL SPRAY    Place 1 spray into both nostrils 2 (two) times daily as needed (nose bleed).   ROPINIROLE  (REQUIP ) 1 MG TABLET    TAKE ONE TABLET (1 MG TOTAL) BY MOUTH  IN THE MORNING AND AT BEDTIME.   ROSUVASTATIN  (CRESTOR ) 20 MG TABLET    Take 1 tablet (20 mg total) by mouth daily.   SILDENAFIL  (REVATIO ) 20 MG TABLET    Take 1 tablet (20 mg total) by mouth 3 (three) times daily.   TIOTROPIUM BROMIDE-OLODATEROL (STIOLTO RESPIMAT ) 2.5-2.5 MCG/ACT AERS    Inhale 2 puffs into the lungs daily.   WOUND CLEANSERS (VASHE WOUND) 0.033 % SOLN    Apply 1 Act topically daily at 12 noon.  Modified Medications   No medications on file  Discontinued Medications   No medications on file    Subjective: 78 year old male with prior history of colon cancer, prostate cancer, lung ca, CHF, chronic respiratory failure /COPD on 3 L oxygen , bilateral PE status post thrombolytics, DVTs, CAD, PVD, paroxysmal A-fib, pulmonary hypertension, tobacco abuse who is here for HFU after recent hospital admission  3/20-4/2 for empyema. Underwent thoracentesis on 3/27. Cx with strep pneumoniae. Seen by Pulmonary and ID. Patient refused chest tube and was discharged on 4/2 to complete 4 weeks of PO augmentin . EOT 4/24. Seen by PCP on 4/10, notes reviewed. He missed prior appointment with me.   4/28 He is by himself and on nasal cannula. He is dyspneic and his spo2 is in low to mid 80s in 4L [place] and went up to low to mid 90% sp02 on 6Liters. He was then placed on NRB. Baseline oxygen  requirement is 4 to 4.5 L . It appears he got lost while coming for the appointment and was without oxygen  for a while as well as his battery for oxygen  died. EMS was called due to hypoxia. He lives with a room mate and says nurse comes in house for his meds and thinks he has completed course of PO augmentin .   Review of Systems: he is sob and unable to get ROS at this time.   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 01/01/2015   Complication of anesthesia    wake up with a head ache   COPD (chronic obstructive pulmonary disease) (HCC)    Coronary artery disease    Edema 08/16/2010   GERD (gastroesophageal reflux disease)    Headache    related to sinus congestion   History of blood transfusion  History of kidney stones    History of MI (myocardial infarction) 10/08/2013   Hypertension    Hypokalemia 04/09/2022   Intracranial bleed (HCC) 12/31/2018   Lactic acidosis 04/09/2022   Leg cramps 08/16/2010   Malignant neoplasm of prostate (HCC) 07/13/2016   Multiple closed facial bone fractures (HCC)    Myocardial infarction [facility])    '09 AND '12   Peripheral vascular disease (HCC)    Shortness of breath    With exertion .   Tibia/fibula fracture 10/06/2013   Tibial plateau fracture 12/29/2014   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HCC) 10/08/2013   UTI (urinary tract infection)    frequent UTI   Visual hallucination 03/18/2015   Past Surgical History:  Procedure Laterality Date   CARDIAC CATHETERIZATION     CHOLECYSTECTOMY     EXTERNAL FIXATION LEG Right 10/06/2013   Procedure: CLOSED REDUCTION RIGHT TIBIAL PLATEAU FRACTURE, EXTERNAL FIXATION RIGHT LEG, PLACEMENT OF WOUND VAC;  Surgeon: Arlette Lagos, MD;  Location: [facility] OR;  Service: Orthopedics;  Laterality: Right;   FEMORAL-POPLITEAL BYPASS GRAFT Right 10/06/2013   Procedure: RIGHT POPLITEAL-POPLITEAL ARTERY BYPASS GRAFT;  Surgeon: Mayo Speck, MD;  Location: [facility] OR;  Service: Vascular;  Laterality: Right;   FRACTURE SURGERY Right 2014   HARDWARE REMOVAL Right 12/02/2013   Procedure: REMOVAL EXTERNAL FIXATION RIGHT LEG ;  Surgeon: Arlette Lagos, MD;  Location: [facility] OR;  Service: Orthopedics;  Laterality: Right;   HERNIA REPAIR  Right 1990's   I & D EXTREMITY Right 10/09/2013   Procedure: IRRIGATION AND DEBRIDEMENT RIGHT LEG, CLOSURE  OF WOUNDS, PLACEMENT OF WOUND VAC ON EACH SIDE OF LEG;  Surgeon: Arlette Lagos, MD;  Location: [facility] OR;  Service: Orthopedics;  Laterality: Right;   IR ANGIOGRAM PULMONARY RIGHT SELECTIVE  04/10/2022   IR ANGIOGRAM SELECTIVE EACH ADDITIONAL VESSEL  04/10/2022   IR NEPHROSTOMY PLACEMENT LEFT  06/19/2016   IR THORACENTESIS ASP PLEURAL SPACE W/IMG GUIDE  05/31/2023   IR THROMBECT PRIM MECH INIT (INCLU) MOD SED  04/10/2022   IR US  GUIDE VASC ACCESS RIGHT  04/10/2022   IVC filter  2009   placed @ [facility]/ Removed in 2010.   IVC Filter Removed     LEFT HEART CATH AND CORONARY ANGIOGRAPHY Right 10/11/2018   Procedure: LEFT HEART CATH AND CORONARY ANGIOGRAPHY;  Surgeon: Cherrie Cornwall, MD;  Location: [facility] INVASIVE CV LAB;  Service: Cardiovascular;  Laterality: Right;   ligament leg Left    NEPHROLITHOTOMY Left 06/19/2016   Procedure: NEPHROLITHOTOMY PERCUTANEOUS;  Surgeon: Dustin Gimenez, MD;  Location: [facility] ORS;  Service: Urology;  Laterality: Left;   ORIF TIBIA FRACTURE Right 12/29/2014   Procedure: OPEN REDUCTION INTERNAL FIXATION (ORIF) RIGHT TIBIA FRACTURE, RIA VS ICBG;  Surgeon: Hardy Lia, MD;  Location: [facility] OR;  Service: Orthopedics;  Laterality: Right;   RADIOACTIVE SEED IMPLANT N/A 09/12/2016   Procedure: RADIOACTIVE SEED IMPLANT/BRACHYTHERAPY IMPLANT;  Surgeon: Dustin Gimenez, MD;  Location: [facility] ORS;  Service: Urology;  Laterality: N/A;   RIGHT/LEFT HEART CATH AND CORONARY ANGIOGRAPHY N/A 04/13/2022   Procedure: RIGHT/LEFT HEART CATH AND CORONARY ANGIOGRAPHY;  Surgeon: Mardell Shade, MD;  Location: [facility] INVASIVE CV LAB;  Service: Cardiovascular;  Laterality: N/A;    Social History   Tobacco Use   Smoking status: Former    Current packs/day: 0.00    Average packs/day: 0.5 packs/day for 52.0 years (26.0 ttl pk-yrs)    Types: Cigarettes    Start date: 07/05/1970    Quit date: 07/05/2022  Years since quitting: 0.9   Smokeless tobacco: Never  Vaping Use   Vaping status: Never Used  Substance Use Topics   Alcohol use: No    Alcohol/week: 0.0 standard drinks of alcohol    Comment: Stopped 2009   Drug use: No    Family History  Problem Relation Age of Onset   Hypertension Mother    Prostate cancer Brother    Mental illness Neg Hx     Allergies  Allergen Reactions   Adhesive [Tape] Other (See Comments)    After right leg fracture surgery, pt developed a large blister where tape was applied to his right leg. OK to use paper tape.   Eggs-Apples-Oats [Alitraq] Other (See Comments)    Caused chest pains   Imdur  [Isosorbide  Dinitrate] Other (See Comments)    hallucinations   Singulair  [Montelukast  Sodium] Other (See Comments)    Hallucinations     Health Maintenance  Topic Date Due   Medicare Annual Wellness (AWV)  Never done   COVID-19 Vaccine (1) Never done   FOOT EXAM  Never done   OPHTHALMOLOGY EXAM  Never done   Diabetic kidney evaluation - Urine ACR  Never done   DTaP/Tdap/Td (1 - Tdap) Never done   Zoster Vaccines- Shingrix (1 of 2) Never done   Colonoscopy  Never done   Pneumonia Vaccine 37+ Years old (1 of 2 - PCV) 07/20/2023 (Originally 08/28/1964)   HEMOGLOBIN A1C  08/06/2023   INFLUENZA VACCINE  10/05/2023   Lung Cancer Screening  05/23/2024   Diabetic kidney evaluation - eGFR measurement  06/13/2024   Hepatitis C Screening  Completed   HPV VACCINES  Aged Out   Meningococcal B Vaccine  Aged Out    Objective: BP 105/62   Pulse 100   Temp 98.2 F (36.8 C) (Temporal)   SpO2 96% Comment: 6L mask   Physical Exam Constitutional:      Appearance:  HENT:     Head: Normocephalic and atraumatic.      Mouth: Mucous membranes are moist.  Eyes:    Conjunctiva/sclera: Conjunctivae normal.     Pupils: Pupils are equal, round, and b/l symmetrical  Cardiovascular:     Rate and Rhythm: Normal rate and regular rhythm.     Heart  sounds:  Pulmonary:     Effort: Pulmonary effort is increased due to hypoxia     Breath sounds: decreased b/l air entry, mainly at the bases   Abdominal:     General: Non distended     Palpations: soft.   Musculoskeletal:        General: b/l pedal edema    Skin:    General: Skin is warm and dry.     Comments:  Neurological:     General: sitting in the wheel chair    Mental Status: awake, alert and oriented to person, place, and time.   Psychiatric:        Mood and Affect: Mood normal.   Lab Results Lab Results  Component Value Date   WBC 7.3 06/14/2023   HGB 11.4 (L) 06/14/2023   HCT 35.2 (L) 06/14/2023   MCV 93.4 06/14/2023   PLT 328 06/14/2023    Lab Results  Component Value Date   CREATININE 1.02 06/14/2023   BUN 15 06/14/2023   NA 137 06/14/2023   K 3.7 06/14/2023   CL 103 06/14/2023   CO2 23 06/14/2023    Lab Results  Component Value Date   ALT 25 04/19/2023  AST 36 04/19/2023   ALKPHOS 107 04/19/2023   BILITOT 1.2 04/19/2023    Lab Results  Component Value Date   CHOL 166 02/05/2023   HDL 65 02/05/2023   LDLCALC 88 02/05/2023   TRIG 67 02/05/2023   CHOLHDL 2.6 02/05/2023   No results found for: "LABRPR", "RPRTITER" No results found for: "HIV1RNAQUANT", "HIV1RNAVL", "CD4TABS"   Assessment/plan # Hypoxia  - in the setting of being out of oxygen  prior to the appointment as well as prior empyema - Patient was stabilized with NRB in the office - EMS called and patient sent him to ED for further evaluation  # Empyema  - s/p course of PO augmentin  on 4/24 - Will need imaging of chest Xray vs CT in ED  I have personally spent 40 minutes involved in face-to-face and non-face-to-face activities for this patient on the day of the visit. Professional time spent includes the following activities: Preparing to see the patient (review of tests), Obtaining and/or reviewing separately obtained history (admission/discharge record), Performing a medically  appropriate examination and/or evaluation , Ordering medications/tests/procedures, referring and communicating with other health care professionals, Documenting clinical information in the EMR, Independently interpreting results (not separately reported), Communicating results to the patient/family/caregiver, Counseling and educating the patient/family/caregiver and Care coordination (not separately reported).   Of note, portions of this note may have been created with voice recognition software. While this note has been edited for accuracy, occasional wrong-word or 'sound-a-like' substitutions may have occurred due to the inherent limitations of voice recognition software.   Melvina Stage, MD [facility] 07/02/2023, 10:37 AM

## 2023-07-02 NOTE — NC FL2 (Signed)
 [place]  MEDICAID FL2 LEVEL OF CARE FORM     IDENTIFICATION  Patient Name: Bruce [name] Birthdate: [birth year] Sex: male Admission Date (Current Location): 07/02/2023  [facility] and [place] Number:  [employer] and Address:  The [place]. [facility], [address] [place], [place] [address]      Provider Number: [id number]  Attending Physician Name and Address:  Arvilla Birmingham, MD  Relative Name and Phone Number:       Current Level of Care: Hospital Recommended Level of Care: Skilled Nursing Facility Prior Approval Number:    Date Approved/Denied:   PASRR Number: [id number]  Discharge Plan: SNF    Current Diagnoses: Patient Active Problem List   Diagnosis Date Noted   Empyema (HCC) 07/02/2023   Medication management 07/02/2023   Hypoxia 07/02/2023   Physical deconditioning 06/14/2023   NICM (nonischemic cardiomyopathy) (HCC) 05/26/2023   Pulmonary arterial hypertension (HCC) 05/26/2023   Noncompliance 05/26/2023   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   Pleural effusion, right 05/24/2023   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   Pulmonary embolism on right (HCC) 05/24/2023   Neuropathy 05/19/2023   Acute on chronic respiratory failure with hypoxia (HCC) 04/19/2023   Acute decompensated heart failure (HCC) 04/18/2023   Prolonged Q-T interval on ECG 04/18/2023   Influenza A 04/18/2023   Cor pulmonale (chronic) (HCC) 04/18/2023   Intermittent claudication (HCC) 12/18/2022   B12 deficiency 12/18/2022   MDD (major depressive disorder), recurrent episode, moderate (HCC) 09/01/2022   Pulmonary hypertension, unspecified (HCC) 04/14/2022   Acute on chronic combined systolic and diastolic CHF (congestive heart failure) (HCC) 04/13/2022   Pulmonary embolism (HCC) 04/09/2022   PAF (paroxysmal atrial fibrillation) (HCC)    NSTEMI (non-ST elevated myocardial infarction) (HCC) 10/10/2018   Syncope 04/28/2018    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   Limb ischemia 11/18/2013   Acute blood loss anemia 10/08/2013   Anxiety disorder 10/08/2013   Dyslipidemia 10/08/2013   GERD (gastroesophageal reflux disease) 10/08/2013   Anticoagulated 10/08/2013   Hypertension    CAD (coronary artery disease) 08/16/2010   COPD (chronic obstructive pulmonary disease) (HCC) 08/16/2010   DVT of leg (deep venous thrombosis) (HCC) 08/16/2010    Orientation RESPIRATION BLADDER Height & Weight     Self, Time, Situation, Place  O2 (6L currently) Continent Weight:   Height:     BEHAVIORAL SYMPTOMS/MOOD NEUROLOGICAL BOWEL NUTRITION STATUS      Continent Diet (See discharge instructions)  AMBULATORY STATUS COMMUNICATION OF NEEDS Skin   Extensive Assist Verbally Normal                       Personal Care Assistance Level of Assistance  Bathing, Dressing, Feeding Bathing Assistance: Limited assistance Feeding assistance: Independent Dressing Assistance: Limited assistance     Functional Limitations Info  Sight, Hearing, Speech Sight Info: Adequate Hearing Info: Impaired Speech Info: Adequate    SPECIAL CARE FACTORS FREQUENCY  PT (By licensed PT), OT (By licensed OT)     PT Frequency: 5x weekly OT Frequency: 5x weekly            Contractures Contractures Info: Not present    Additional Factors Info  Allergies, Code Status Code Status Info: DNR Allergies Info: Adhesive (Tape)  Eggs-apples-oats (Alitraq)  Imdur  (Isosorbide  Dinitrate)  Singulair  (Montelukast  Sodium)           Current Medications (07/02/2023):  This is the current hospital active medication list No current facility-administered medications for this  encounter.   Current Outpatient Medications  Medication Sig Dispense Refill   acetaminophen  (TYLENOL ) 500 MG tablet Take 2 tablets (1,000 mg total) by mouth every 6 (six) hours as needed for mild pain (pain score 1-3), moderate pain (pain score 4-6), fever or headache. (Patient not  taking: Reported on 07/02/2023) 30 tablet 0   albuterol  (VENTOLIN  HFA) 108 (90 Base) MCG/ACT inhaler TAKE TWO PUFFS BY MOUTH AS NEEDED FOR WHEEZING EVERY 4-6 HOURS 8.5 g 5   ALPRAZolam  (XANAX ) 0.5 MG tablet Take 1 tablet (0.5 mg total) by mouth 2 (two) times daily as needed for anxiety. 60 tablet 1   apixaban  (ELIQUIS ) 5 MG TABS tablet TAKE ONE TABLET (5 MG TOTAL) BY MOUTH TWO (TWO) TIMES DAILY. 60 tablet 11   bisoprolol  (ZEBETA ) 5 MG tablet Take 0.5 tablets (2.5 mg total) by mouth daily. 45 tablet 3   budesonide  (PULMICORT ) 0.25 MG/2ML nebulizer solution Take 2 mLs (0.25 mg total) by nebulization 2 (two) times daily. 60 mL 12   cetirizine  (ZYRTEC ) 10 MG tablet Take 1 tablet (10 mg total) by mouth daily. 30 tablet 11   cholecalciferol  (VITAMIN D3) 25 MCG (1000 UNIT) tablet Take 1,000 Units by mouth in the morning.     Elastic Bandages & Supports (MEDICAL COMPRESSION STOCKINGS) MISC Wear daily as needed 2 each 0   FARXIGA  10 MG TABS tablet TAKE ONE TABLET (10 MG TOTAL) BY MOUTH DAILY. 90 tablet 1   feeding supplement (ENSURE ENLIVE / ENSURE PLUS) LIQD Take 237 mLs by mouth 2 (two) times daily between meals. 10000 mL 0   fluticasone  (FLONASE ) 50 MCG/ACT nasal spray Place 1 spray into both nostrils daily as needed for allergies.     furosemide  (LASIX ) 20 MG tablet Take 2 tablets (40 mg total) by mouth daily. 60 tablet 11   gabapentin  (NEURONTIN ) 100 MG capsule TAKE ONE CAPSULE (100 MG TOTAL) BY MOUTH THREE (THREE) TIMES DAILY. 90 capsule 1   lidocaine  (LIDODERM ) 5 % Place 1 patch onto the skin daily. Remove & Discard patch within 12 hours or as directed by MD 30 patch 11   Multiple Vitamin (MULTIVITAMIN) tablet Take 1 tablet by mouth in the morning. For Men     Omega-3 Fatty Acids (FISH OIL PO) Take 1,000 mg by mouth every evening.     omeprazole  (PRILOSEC) 40 MG capsule Take 1 capsule (40 mg total) by mouth 2 (two) times daily. 180 capsule 1   OXYGEN  Inhale 3 L/min into the lungs continuous.      oxymetazoline  (AFRIN) 0.05 % nasal spray Place 1 spray into both nostrils 2 (two) times daily as needed (nose bleed). 30 mL 0   rOPINIRole  (REQUIP ) 1 MG tablet TAKE ONE TABLET (1 MG TOTAL) BY MOUTH IN THE MORNING AND AT BEDTIME. 60 tablet 3   rosuvastatin  (CRESTOR ) 20 MG tablet Take 1 tablet (20 mg total) by mouth daily. 30 tablet 2   sildenafil  (REVATIO ) 20 MG tablet Take 1 tablet (20 mg total) by mouth 3 (three) times daily. 90 tablet 5   Tiotropium Bromide-Olodaterol (STIOLTO RESPIMAT ) 2.5-2.5 MCG/ACT AERS Inhale 2 puffs into the lungs daily. 4 g 12   Wound Cleansers (VASHE WOUND) 0.033 % SOLN Apply 1 Act topically daily at 12 noon. 1000 mL 0     Discharge Medications: Please see discharge summary for a list of discharge medications.  Relevant Imaging Results:  Relevant Lab Results:   Additional Information SSN: [id number]  Dexter Forest, LCSW

## 2023-07-02 NOTE — TOC CM/SW Note (Signed)
 SW spoke with Bonnell Butcher [phone] from [facility] regarding patient. She stated once patient is seen by PT/OT to send over PT/OT notes and H&P. Consult has been placed for patient to be seen.   Pending PT/OT consult.    .Gitty Osterlund, MSW, LCSWA Transition of Care  Clinical Social Worker (ED 3-11 Mon-Fri)  [phone]

## 2023-07-02 NOTE — ED Notes (Signed)
 Help get patient on the monitor did EKG shown to er provider patient is resting with call bell in reach

## 2023-07-02 NOTE — Progress Notes (Unsigned)
 Patient came in with portable home oxygen , states the battery died while he was pulling into the parking lot. Placed him on his reported baseline of 4L nasal cannula with clinic-supply of O2. During transition to clinic-supplied O2 he dropped to 68%.   Patient only improved to mid-80s on 4L [place] which is lower than typical for him. He was able to maintain saturation in the low to mid-90s with 6L via O2 mask.   Patient remained alert throughout. Denied shortness of breath though on assessment he does appear minimally short of breath.   Due to increased O2 demand, provider requested EMS be called.   EMS called at 1052 and arrived at 1100.  Provider and paramedic discussed next steps and ultimately patient was agreeable to be transported to the ED since after trialing him back on 4L [place] his saturation dropped and remained in the high 80s.   Patient increased to 6L [place] and EMS transported patient to [facility].   See vital signs flowsheet for additional documentation.   Dominque Marlin, BSN, RN

## 2023-07-02 NOTE — ED Triage Notes (Addendum)
 PT BIB [facility] from a doctors office for SOB/sepsis concerns.  He was following up on lung infection since March.  Has had full round of ABX.  89% only at PCP.  Doctor sent pt for chest xray.  Meets Sepsis criteria for [facility].   EMS gave atrovent, albuterol , 125 solumedrol, 200mL NS  20G L. Wrist  125/82 96% HR 100 RR 38 ETC02 20 CBG 153

## 2023-07-02 NOTE — Progress Notes (Signed)
 CSW received secure chat from Jenna, [place] SW who states patient is requesting SNF placement, specifically at [facility] in [place].  CSW asked MD for PT consult to assist with discharge planning and insurance authorization requirements.  CSW will complete FL2 and fax patient's clinicals out to [facility] for review.  Shepard Dicker, MSW, LCSW Transitions of Care  Clinical Social Worker II [phone]

## 2023-07-02 NOTE — Discharge Instructions (Signed)
 Follow-up closely in the clinic return for worsening signs or symptoms or if you change your mind for admission.  [place]  [employer] Address: [address] [place], [place] [address] Phone Number: [phone] Hours of Operation: Residents of [place] can come to obtain food Monday through Friday from 8:30am until 3:30pm. Photo ID and Social Security cards required for all residents of a household. Can come six times a year  [facility] Address: [address] [place], [place] [address] Phone Number: [phone] Hours of Operation: Operates Tuesday-Friday 10:00 a.m. to 1 p.m. Requirements: Referral from DSS needed. May come 6 times a year, 30 days apart. Photo ID and SS required for all residents of household.  [facility] Address: [address] [place], [place] [address] Phone Number: [phone] Hours of Operation: Food pantry is open on the last Saturday of each month from 10:00 am - 12:00 noon. No appointment needed. No qualifications.  [facility] Address: [address] [place], [place] [address] Phone Number: [phone] EXT. 21 Hours of Operation: Must make reservations to pick up food on Saturdays. Sign ups for Saturday pick up beginning at 8:30 a.m. on Monday morning.  [facility] Address: [address] [place], [place] [address] Phone Number: [phone] Hours of Operation: If you need food, bring proper identification such as a driver's license to receive a bag of food once a month. Requirements: Can come once every 30 days with referral DSS, Holiday representative, Mental health etc. Each referral good for six visits. Photo ID required. *1st visit no referral required.  [facility] Address: [address], [place] [address] Phone Number: [phone]  [place] [facility] Address: [address] [place], [place] [address] Phone Number:  [phone] Hours of Operation:  You can register at [email or web address]/ for free groceries  [facility] Address: [address], [place] [address] Phone Number: [phone] Hours of Operation: Drive through giveaway, first come first served. Every 3rd Saturday 11AM - 1PM  [facility] [address] Address: [address] [place] [address] Phone Number: [phone]   [place]  [facility] Address: [address] [place], [place] [address] Phone Number: [phone] Hours of Operation: Once a month every 3rd Saturday  [facility] Address: [address] [place], [place] [address] Phone Number: [phone] Hours of Operation: Distribution happens from 9:00-10:00 a.m. every Saturday.     [facility] Address: [address] [place], [place] [address] Phone Number: [phone] Hours of Operation: ONCE a week for the community food distribution held every Tuesday, Wednesday and Thursday from 11 a.m. - 2:00 p.m. Food is available on a first come, first serve basis and varies week to week. No appointment necessary for drive thru pick up.  [facility] Address: [address] [place], [place] [address] Phone Number: [phone] Hours of Operation: Open every 3rd Thursday 9:30 a.m. - 11:00 a.m.  [facility] Address: [address] [place], [place] [address] Phone Number: [phone] Hours of Operation: Please call for hours, directions, and questions  [facility] [place] [facility] Address: [address], [place], [place]  [address] Phone Number: [phone] Website: https://www.Hollyguns.co.za Food Finder app: https://findfood.ghpfa.org  [employer] Address: [address] [place], [place] [address] Phone Number: [phone] Hours of Operation: Contact Bree Harpe. Enrolled Substance Abuse Clients Only  [place] [employer] Address: [address] [place]  [place], [address]  Phone Number: [phone] Hours of Operation: Contact Alene Ana. Food pantry open the 3rd Saturday of each month from 9 a.m. -12 p.m. only  [place] [facility] Address: [address] [place], [place] [address] Phone Number: [phone] Hours of Operation: Contact Loletta Ripple. Emergency food bank open on Saturdays by appointment only  [facility] Address: [address] [place], [place] [address] Phone Number: [phone] Hours of Operation: No specific contact person; Anyone can help  [facility] Address: [address] [place], [place] [address] Phone Number: [phone] Hours of Operation: Contact Julia Oats. Agency gives out a bag of food every Thursday from 2-4 p.m. only, and also provides a community meal every Thursday between 5-6 p.m. Other services provided include rent/mortgage and utility assistance, women's winter shelter, thrift store, and senior adult activities.  [facility] [place] Address: [address] [place], [place] [address] Phone Number: [phone] Hours of Operation: The Emergency Food Assistance Program provides individuals and families with a generous supply of food including meat, fresh vegetables, and nonperishable items. The food box contains five days' worth of food, and each family or individual can receive a box once per month. M, W, Th, Fr 11am-2pm, walk-ins welcome.  [facility] Address: [address] [place], [place] [address]  Phone Number: [phone] Hours of Operation: Contact Asia Blanca Bunch. Tuesdays and Thursdays from 11am - 3pm by appointment only

## 2023-07-02 NOTE — Telephone Encounter (Signed)
 H&V Care Navigation CSW Progress Note  Clinical Social Worker called [facility] to check in regarding their ability to take patient for rehab and help with transition to LTC- while doing so saw that pt was in the ED.  CSW met with pt in ED and he reports he is agreeable to continuing to pursue placement at [facility].  CSW messaged inpatient team to assist in getting required documents (fl2, PT/OT eval, H&P) sent over to [facility] for review.   Patient was originally on board with plan to work on LTC following SNF but is now unsure as he didn't realize he would no longer receive a check and he would want to be independent coming and going from the facility- explained this would be dependent on level of care and facility rules but encouraged him to consider given how weak he has been and how difficult it is for him to handle basic needs at home.  CSW will continue to follow in outpatient setting and assist as needed.   SDOH Screenings   Food Insecurity: Food Insecurity Present (06/08/2023)  Housing: High Risk (06/08/2023)  Transportation Needs: No Transportation Needs (06/08/2023)  Recent Concern: Transportation Needs - Unmet Transportation Needs (05/25/2023)  Utilities: Not At Risk (06/08/2023)  Depression (PHQ2-9): Medium Risk (06/26/2023)  Financial Resource Strain: High Risk (04/12/2022)  Physical Activity: Inactive (02/05/2017)  Social Connections: Unknown (05/25/2023)  Stress: Stress Concern Present (02/05/2017)  Tobacco Use: Medium Risk (07/02/2023)    Denton Flakes, LCSW Clinical Social Worker [facility] Desk#: [phone] Cell#: [phone]

## 2023-07-02 NOTE — Care Management (Addendum)
 This RNCM received call from evening ED SW requesting assistance with portable oxygen  tank for patient as he is leaving AMA. Patient unaware who his home oxygen  services are with. Per chart review patient has home oxygen  (POC & concentrator) with [employer]. Spoke Joe at [employer] after hours who will have a portable tank delivered to bedside.   - 6:45pm this RNCM spoke with ED SW who now reports patient has changed his mind and will remain in the hospital for short term SNF placement.    [facility] ED SW following patient.

## 2023-07-02 NOTE — Progress Notes (Addendum)
 Pharmacy Antibiotic Note  Bruce [name] is a 78 y.o. male admitted on 07/02/2023 with acute on chronic respiratory failure.  Pharmacy has been consulted for cefepime/vancomycin  dosing sepsis concerns. PMH notable for strep pneumoniae empyema hospitalization s/p IR thoracentesis and completing prolonged Abx course after refusing chest tube placement.   -Cta: negative for PE; decr R. pleural effusion, new small left pleural effusion -WBC WNL, sCr 0.97 (bl), afebrile -Blood cultures collected -3/27 Pleural Cx: pan sensitive strep pneumo (Augmentin  for 4 weeks to finish 4/24)   Plan: -Cefepime 2g IV every 8 hours -Vancomycin  1500mg  IV x1 -Vancomycin  1250mg  IV every 24 hours (AUC 465, Vd 0.72, TBW, sCr 0.97) -Monitor renal function -Follow up signs of clinical improvement, LOT, de-escalation of antibiotics      Temp (24hrs), Avg:97.6 F (36.4 C), Min:96.9 F (36.1 C), Max:98.2 F (36.8 C)  Recent Labs  Lab 07/02/23 1204 07/02/23 1221 07/02/23 1605 07/02/23 2305  WBC 7.7  --   --   --   CREATININE 0.97  --   --   --   LATICACIDVEN  --  2.2* 2.5* 4.0*    Estimated Creatinine Clearance: 62.1 mL/min (by C-G formula based on SCr of 0.97 mg/dL).    Antimicrobials this admission: Cefepime 4/29 >>  Vancomycin  4/29 >>   Microbiology results: 4/29 BCx:   Thank you for allowing pharmacy to be a part of this patient's care.  Young Hensen, PharmD. Clinical Pharmacist 07/02/2023 11:58 PM

## 2023-07-02 NOTE — TOC CM/SW Note (Addendum)
 SW met with patient at bedside, SW observed patient was SOB while on oxygen , he was upset because he is worried about his car being left at the doctors office. Patient was yelling "I want to go, I don't want to go to a rehab" SW reassured she understood why he was upset, his concerns about his car but she encouraged the patient to pay attention to his health, she is concerned about not having oxygen , his POC oxygen  is dead and the battery is in his truck. Patient became upset expressing he wanted to leave,he attempted to call a friend but he was unsuccessful. SW contacted RNCM Winne to discuss concerns about patient attempting to go AMA, patient does not have oxygen , his POC is dead and battery is in his truck. He does have a concentrator at home that he uses. Jessee Mormon learned the patient gets his oxygen  from [employer].    Patient has agreed to stay inpatient. SW notified [facility] and Dr. Zavitz.   .Minahil Quinlivan, MSW, LCSWA Transition of Care  Clinical Social Worker (ED 3-11 Mon-Fri)  [phone]

## 2023-07-02 NOTE — ED Provider Notes (Addendum)
 Patient care signed out to follow-up CT scan and reassess.  Patient presented from the clinic due to increased work of breathing and requiring 5 to 6 L nasal cannula and normally on 4 L at home for chronic respiratory failure.  On examination patient respiratory rate low 20s, oxygen  saturation 92% on 5 L.  Discussed admission and patient refused, patient has capacity make decisions and would like to be discharged.  Patient needs a ride to his truck at the clinic on [address] where his battery is for his oxygen  arrangement. Discussed with nursing and consulted social work to assist.  Patient is leave AGAINST MEDICAL ADVICE.  CT scan results of the chest reviewed no acute pulmonary embolism and decreased pleural effusion.  Social work assisted in nursing however unable to obtain patient's battery for oxygen  machine.  Patient unable to go home even though he was hoping to.  Patient understands and is agreeable to stay for observation.  Hospitalist paged.  CT Angio Chest PE W and/or Wo Contrast Result Date: 07/02/2023 CLINICAL DATA:  Pulmonary embolism suspected, high probability. History of sub massive pulmonary embolism. Re-evaluation of prior empyema collection, previously drained. New hypoxia. EXAM: CT ANGIOGRAPHY CHEST WITH CONTRAST TECHNIQUE: Multidetector CT imaging of the chest was performed using the standard protocol during bolus administration of intravenous contrast. Multiplanar CT image reconstructions and MIPs were obtained to evaluate the vascular anatomy. RADIATION DOSE REDUCTION: This exam was performed according to the departmental dose-optimization program which includes automated exposure control, adjustment of the mA and/or kV according to patient size and/or use of iterative reconstruction technique. CONTRAST:  75mL OMNIPAQUE  IOHEXOL  350 MG/ML SOLN COMPARISON:  Radiographs 07/02/2023 and 06/04/2023. Chest CTA 05/24/2023 and 07/24/2022. FINDINGS: Cardiovascular: The pulmonary arteries are  well opacified with contrast to the level of the segmental branches. 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 The right upper lobe pulmonary artery remains patent. There is also mild chronic eccentric nonocclusive thrombus within segmental branches of the left upper and lower lobes. No acute pulmonary emboli are demonstrated. Atherosclerosis of the aorta, great vessels and coronary arteries. No acute systemic arterial abnormalities are identified. The heart size is normal. Small pericardial effusion appears unchanged. Mediastinum/Nodes: There are no enlarged mediastinal, hilar or axillary lymph nodes. The thyroid  gland, trachea and esophagus demonstrate no significant findings. Lungs/Pleura: Previously demonstrated loculated right pleural effusion has decreased in volume and demonstrates less associated pleural enhancement. There is a new small dependent left pleural effusion. No pneumothorax. Interval improved aeration of the right lower lobe. There is chronic central airway thickening with scattered scarring and mild centrilobular emphysema. A 5 mm lingular nodule on image 97/6 is unchanged. No new or enlarging pulmonary nodules. Upper abdomen: No acute findings are seen in the visualized upper abdomen. Musculoskeletal/Chest wall: There is no chest wall mass or suspicious osseous finding. Mild spondylosis. Review of the MIP images confirms the above findings. IMPRESSION: 1. No evidence of acute pulmonary embolism. 2. 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 The appearance of the pulmonary arteries is similar to previous CTA's. 3. Interval decreased volume of loculated right pleural effusion with less  associated pleural enhancement. New small dependent left pleural effusion. 4. Stable small lingular nodule. 5. Aortic Atherosclerosis (ICD10-I70.0)  and Emphysema (ICD10-J43.9). Electronically Signed   By: Elmon Hagedorn M.D.   On: 07/02/2023 16:26   DG Chest Port 1 View Result Date: 07/02/2023 CLINICAL DATA:  Questionable sepsis - evaluate for abnormality EXAM: PORTABLE CHEST 1 VIEW COMPARISON:  Radiographs 06/04/2023 and 05/31/2023.  CT 05/24/2023. FINDINGS: 1218 hours. Two views submitted. The heart size and mediastinal contours are stable. Previously demonstrated right pleural effusion has nearly completely resolved. Scattered scarring in both lungs appears unchanged. No confluent airspace disease or pneumothorax. The bones appear unchanged. Telemetry leads overlie the chest. IMPRESSION: Near complete resolution of previously demonstrated right pleural effusion. No evidence of acute cardiopulmonary process. Electronically Signed   By: Elmon Hagedorn M.D.   On: 07/02/2023 13:18   DG Ankle Right Port Result Date: 06/06/2023 CLINICAL DATA:  Ankle pain EXAM: PORTABLE RIGHT ANKLE - 2 VIEW COMPARISON:  11/02/2022 FINDINGS: Frontal and lateral views of the right ankle are obtained. No acute fracture, subluxation, or dislocation. Ankle mortise is intact. Joint spaces are well preserved. Soft tissues are unremarkable. IMPRESSION: 1. Unremarkable right ankle. Electronically Signed   By: Bobbye Burrow M.D.   On: 06/06/2023 15:35   DG CHEST PORT 1 VIEW Result Date: 06/04/2023 CLINICAL DATA:  Empyema.  Shortness of breath. EXAM: PORTABLE CHEST 1 VIEW COMPARISON:  Radiographs 05/31/2023 and 05/30/2023.  CT 05/24/2023. FINDINGS: 0601 hours. No significant change in residual loculated pleural effusion laterally at the right lung base. Stable mild underlying compressive atelectasis. No evidence of pneumothorax. There are stable emphysematous changes and scattered scarring in both lungs. The heart size and mediastinal  contours are stable. IMPRESSION: No significant change in residual loculated right pleural effusion. No pneumothorax or acute findings. Electronically Signed   By: Elmon Hagedorn M.D.   On: 06/04/2023 09:56      Clay Cummins, MD 07/02/23 1713    Clay Cummins, MD 07/02/23 1910

## 2023-07-02 NOTE — ED Provider Notes (Signed)
 [place] EMERGENCY DEPARTMENT AT [facility] Provider Note   CSN: [id number] Arrival date & time: 07/02/23  1146     History  Chief Complaint  Patient presents with   Shortness of Breath    Bruce [name] is a 78 y.o. male with a history of COPD on home oxygen  4 L nasal cannula presenting to the ED with concern for shortness of breath and coughing.  Patient was initially evaluated at outpatient clinic where there was concern for O2 saturations into the high 80s despite home oxygen , and therefore he was transported on 6 L nasal cannula to the ED by EMS.  I reviewed the note from his infectious disease specialist today reported concern for empyema.  His medical history is notable for colon cancer, prostate cancer, lung cancer, CHF, chronic respiratory failure on 3 to 4 L at home, bilateral PE status post thrombolytics, DVT, paroxysmal A-fib on eliquis , pulmonary hypertension, smoking, empyema 3/20-4/2 with thoracentesis on 3/27 growing strep pneumoniae, treated with 4 weeks PO augmentin  until 4/24.  HPI     Home Medications Prior to Admission medications   Medication Sig Start Date End Date Taking? Authorizing Provider  acetaminophen  (TYLENOL ) 500 MG tablet Take 2 tablets (1,000 mg total) by mouth every 6 (six) hours as needed for mild pain (pain score 1-3), moderate pain (pain score 4-6), fever or headache. Patient not taking: Reported on 07/02/2023 04/24/23   Malen Scudder, DO  albuterol  (VENTOLIN  HFA) 108 [phone] Base) MCG/ACT inhaler TAKE TWO PUFFS BY MOUTH AS NEEDED FOR WHEEZING EVERY 4-6 HOURS 11/10/22   Cherrie Cornwall, MD  ALPRAZolam  (XANAX ) 0.5 MG tablet Take 1 tablet (0.5 mg total) by mouth 2 (two) times daily as needed for anxiety. 05/02/23   Trenda Frisk, FNP  apixaban  (ELIQUIS ) 5 MG TABS tablet TAKE ONE TABLET (5 MG TOTAL) BY MOUTH TWO (TWO) TIMES DAILY. 09/26/22   Bensimhon, Rheta Celestine, MD  bisoprolol  (ZEBETA ) 5 MG tablet Take 0.5 tablets (2.5 mg total) by mouth daily.  06/14/23   Sheryl Donna, NP  budesonide  (PULMICORT ) 0.25 MG/2ML nebulizer solution Take 2 mLs (0.25 mg total) by nebulization 2 (two) times daily. 04/24/23   Malen Scudder, DO  cetirizine  (ZYRTEC ) 10 MG tablet Take 1 tablet (10 mg total) by mouth daily. 05/12/22   Glendale Landmark, NP  cholecalciferol  (VITAMIN D3) 25 MCG (1000 UNIT) tablet Take 1,000 Units by mouth in the morning.    [provider]  Elastic Bandages & Supports (MEDICAL COMPRESSION STOCKINGS) MISC Wear daily as needed 05/18/23   Trenda Frisk, FNP  FARXIGA  10 MG TABS tablet TAKE ONE TABLET (10 MG TOTAL) BY MOUTH DAILY. 12/15/22   Milford, Arlice Bene, FNP  feeding supplement (ENSURE ENLIVE / ENSURE PLUS) LIQD Take 237 mLs by mouth 2 (two) times daily between meals. 06/06/23   Kraig Peru, MD  fluticasone  (FLONASE ) 50 MCG/ACT nasal spray Place 1 spray into both nostrils daily as needed for allergies. 03/26/23   [provider]  furosemide  (LASIX ) 20 MG tablet Take 2 tablets (40 mg total) by mouth daily. 05/23/23 05/22/24  Elmarie Hacking, FNP  gabapentin  (NEURONTIN ) 100 MG capsule TAKE ONE CAPSULE (100 MG TOTAL) BY MOUTH THREE (THREE) TIMES DAILY. 06/28/23   Trenda Frisk, FNP  lidocaine  (LIDODERM ) 5 % Place 1 patch onto the skin daily. Remove & Discard patch within 12 hours or as directed by MD 05/12/22   Boswell, Chelsa, NP  Multiple Vitamin (MULTIVITAMIN) tablet Take 1  tablet by mouth in the morning. For Men    [provider]  Omega-3 Fatty Acids (FISH OIL PO) Take 1,000 mg by mouth every evening.    [provider]  omeprazole  (PRILOSEC) 40 MG capsule Take 1 capsule (40 mg total) by mouth 2 (two) times daily. 05/02/23   Trenda Frisk, FNP  OXYGEN  Inhale 3 L/min into the lungs continuous.    [provider]  oxymetazoline  (AFRIN) 0.05 % nasal spray Place 1 spray into both nostrils 2 (two) times daily as needed (nose bleed). 06/06/23   Kraig Peru, MD  rOPINIRole  (REQUIP ) 1 MG tablet  TAKE ONE TABLET (1 MG TOTAL) BY MOUTH IN THE MORNING AND AT BEDTIME. 05/23/23   Trenda Frisk, FNP  rosuvastatin  (CRESTOR ) 20 MG tablet Take 1 tablet (20 mg total) by mouth daily. 04/24/23   Malen Scudder, DO  sildenafil  (REVATIO ) 20 MG tablet Take 1 tablet (20 mg total) by mouth 3 (three) times daily. 01/15/23   Milford, Arlice Bene, FNP  Tiotropium Bromide-Olodaterol (STIOLTO RESPIMAT ) 2.5-2.5 MCG/ACT AERS Inhale 2 puffs into the lungs daily. 04/24/23 04/23/24  Malen Scudder, DO  Wound Cleansers (VASHE WOUND) 0.033 % SOLN Apply 1 Act topically daily at 12 noon. 06/06/23   Kraig Peru, MD      Allergies    Adhesive [tape], Eggs-apples-oats [alitraq], Imdur  [isosorbide  dinitrate], and Singulair  [montelukast  sodium]    Review of Systems   Review of Systems  Physical Exam Updated Vital Signs BP (!) 153/96   Pulse 96   Temp (!) 96.9 F (36.1 C) (Axillary)   Resp (!) 46   SpO2 95%  Physical Exam Constitutional:      General: He is not in acute distress.    Comments: Thin, cachectic   HENT:     Head: Normocephalic and atraumatic.  Eyes:     Conjunctiva/sclera: Conjunctivae normal.     Pupils: Pupils are equal, round, and reactive to light.  Cardiovascular:     Rate and Rhythm: Regular rhythm. Tachycardia present.  Pulmonary:     Effort: Pulmonary effort is normal.     Comments: Faint breath sounds bilaterally, no audible wheezing, 91-93% on 6L [place] Abdominal:     General: There is no distension.     Tenderness: There is no abdominal tenderness.  Skin:    General: Skin is warm and dry.  Neurological:     General: No focal deficit present.     Mental Status: He is alert. Mental status is at baseline.  Psychiatric:        Mood and Affect: Mood normal.        Behavior: Behavior normal.     ED Results / Procedures / Treatments   Labs (all labs ordered are listed, but only abnormal results are displayed) Labs Reviewed  COMPREHENSIVE METABOLIC PANEL WITH GFR - Abnormal; Notable for  the following components:      Result Value   Potassium 3.4 (*)    Glucose, Bld 102 (*)    Albumin  2.7 (*)    All other components within normal limits  CBC WITH DIFFERENTIAL/PLATELET - Abnormal; Notable for the following components:   RBC 3.91 (*)    Hemoglobin 11.7 (*)    HCT 37.3 (*)    RDW 16.9 (*)    All other components within normal limits  PROTIME-INR - Abnormal; Notable for the following components:   Prothrombin Time 16.2 (*)    INR 1.3 (*)    All other  components within normal limits  I-STAT CG4 LACTIC ACID, ED - Abnormal; Notable for the following components:   Lactic Acid, Venous 2.2 (*)    All other components within normal limits  CULTURE, BLOOD (ROUTINE X 2)  CULTURE, BLOOD (ROUTINE X 2)  I-STAT CG4 LACTIC ACID, ED    EKG EKG Interpretation Date/Time:  Monday July 02 2023 12:07:13 EDT Ventricular Rate:  99 PR Interval:  154 QRS Duration:  99 QT Interval:  361 QTC Calculation: 464 R Axis:   101  Text Interpretation: Sinus rhythm Probable left atrial enlargement Probable right ventricular hypertrophy Borderline T abnormalities, inferior leads Confirmed by Jerald Molly [phone]) on 07/02/2023 12:12:40 PM  Radiology DG Chest Port 1 View Result Date: 07/02/2023 CLINICAL DATA:  Questionable sepsis - evaluate for abnormality EXAM: PORTABLE CHEST 1 VIEW COMPARISON:  Radiographs 06/04/2023 and 05/31/2023.  CT 05/24/2023. FINDINGS: 1218 hours. Two views submitted. The heart size and mediastinal contours are stable. Previously demonstrated right pleural effusion has nearly completely resolved. Scattered scarring in both lungs appears unchanged. No confluent airspace disease or pneumothorax. The bones appear unchanged. Telemetry leads overlie the chest. IMPRESSION: Near complete resolution of previously demonstrated right pleural effusion. No evidence of acute cardiopulmonary process. Electronically Signed   By: Elmon Hagedorn M.D.   On: 07/02/2023 13:18     Procedures Procedures    Medications Ordered in ED Medications  sodium chloride  0.9 % bolus 1,000 mL (0 mLs Intravenous Stopped 07/02/23 1423)  iohexol  (OMNIPAQUE ) 350 MG/ML injection 75 mL (75 mLs Intravenous Contrast Given 07/02/23 1540)    ED Course/ Medical Decision Making/ A&P Clinical Course as of 07/02/23 1549  Mon Jul 02, 2023  1546 Pt signed out to Dr Salomon Cree EDP pending follow up on CT imaging.  At this time I do not see clear evidence of sepsis.  Patient with mildly elevated lactate for which she is receiving fluids that I suspect is related to dehydration and poor fluid intake.  X-ray shows overall improvement from prior admission.  The patient expressed significant concerns about needing to stay longer in the ED, as he is anxious to get to his car and get home.  I explained my concern that he is still needing a CT scan to better delineate whether there may be an underlying infection, and he is also on a new oxygen  requirement and tachypneic.  He says "I breathe like this all the time" and says he has oxygen  at home and he will not be staying in the hospital.  He is willing to wait at this time for CT imaging results.  [MT]    Clinical Course User Index [MT] Tyshawna Alarid, Janalyn Me, MD                                 Medical Decision Making Amount and/or Complexity of Data Reviewed Labs: ordered. Radiology: ordered.  Risk Prescription drug management.   This patient presents to the ED with concern for SOB, hypoxia. This involves an extensive number of treatment options, and is a complaint that carries with it a high risk of complications and morbidity.  The differential diagnosis includes COPD exacerbation vs PNA vs recurring empyema vs other  Co-morbidities that complicate the patient evaluation: hx of smoking, COPD  Additional history obtained from EMS  External records from outside source obtained and reviewed including ID office note, prior CT imaging  I ordered and  personally interpreted labs.  The pertinent results include: CBC with no leukocytosis.  CMP near baseline levels  I ordered imaging studies including dg chest I independently visualized and interpreted imaging which showed no focal infiltrate and improvement of x-ray from prior imaging I agree with the radiologist interpretation  CT PE study ordered and pending at the time of signout  The patient was maintained on a cardiac monitor.  I personally viewed and interpreted the cardiac monitored which showed an underlying rhythm of: Sinus tachycardia  Per my interpretation the patient's ECG shows tachycardia  I ordered medication including fluid bolus for suspected dehydration  Patient received IV steroids 125 solumedrol and DuoNebs per EMS.  I do not hear continued audible wheezing to raise concern for COPD exacerbation at this time  After the interventions noted above, I reevaluated the patient and found that they have: stayed the same  Social Determinants of Health: counseled about smoking cessation          Final Clinical Impression(s) / ED Diagnoses Final diagnoses:  None    Rx / DC Orders ED Discharge Orders     None         Arvilla Birmingham, MD 07/02/23 1549

## 2023-07-03 ENCOUNTER — Telehealth: Payer: Self-pay

## 2023-07-03 ENCOUNTER — Other Ambulatory Visit (HOSPITAL_COMMUNITY): Payer: Self-pay

## 2023-07-03 DIAGNOSIS — J869 Pyothorax without fistula: Secondary | ICD-10-CM | POA: Diagnosis present

## 2023-07-03 DIAGNOSIS — I2781 Cor pulmonale (chronic): Secondary | ICD-10-CM | POA: Diagnosis present

## 2023-07-03 DIAGNOSIS — I10 Essential (primary) hypertension: Secondary | ICD-10-CM | POA: Diagnosis not present

## 2023-07-03 DIAGNOSIS — R0902 Hypoxemia: Secondary | ICD-10-CM | POA: Diagnosis present

## 2023-07-03 DIAGNOSIS — A419 Sepsis, unspecified organism: Secondary | ICD-10-CM | POA: Insufficient documentation

## 2023-07-03 DIAGNOSIS — Z86711 Personal history of pulmonary embolism: Secondary | ICD-10-CM | POA: Diagnosis not present

## 2023-07-03 DIAGNOSIS — I739 Peripheral vascular disease, unspecified: Secondary | ICD-10-CM | POA: Diagnosis present

## 2023-07-03 DIAGNOSIS — R6520 Severe sepsis without septic shock: Secondary | ICD-10-CM | POA: Insufficient documentation

## 2023-07-03 DIAGNOSIS — Z9981 Dependence on supplemental oxygen: Secondary | ICD-10-CM | POA: Diagnosis not present

## 2023-07-03 DIAGNOSIS — Z85118 Personal history of other malignant neoplasm of bronchus and lung: Secondary | ICD-10-CM | POA: Diagnosis not present

## 2023-07-03 DIAGNOSIS — I252 Old myocardial infarction: Secondary | ICD-10-CM | POA: Diagnosis not present

## 2023-07-03 DIAGNOSIS — I272 Pulmonary hypertension, unspecified: Secondary | ICD-10-CM | POA: Diagnosis present

## 2023-07-03 DIAGNOSIS — Z87891 Personal history of nicotine dependence: Secondary | ICD-10-CM | POA: Diagnosis not present

## 2023-07-03 DIAGNOSIS — E876 Hypokalemia: Secondary | ICD-10-CM | POA: Diagnosis present

## 2023-07-03 DIAGNOSIS — Z7951 Long term (current) use of inhaled steroids: Secondary | ICD-10-CM | POA: Diagnosis not present

## 2023-07-03 DIAGNOSIS — J449 Chronic obstructive pulmonary disease, unspecified: Secondary | ICD-10-CM | POA: Diagnosis present

## 2023-07-03 DIAGNOSIS — Z7901 Long term (current) use of anticoagulants: Secondary | ICD-10-CM | POA: Diagnosis not present

## 2023-07-03 DIAGNOSIS — Z66 Do not resuscitate: Secondary | ICD-10-CM | POA: Diagnosis not present

## 2023-07-03 DIAGNOSIS — Z8679 Personal history of other diseases of the circulatory system: Secondary | ICD-10-CM | POA: Diagnosis not present

## 2023-07-03 DIAGNOSIS — I480 Paroxysmal atrial fibrillation: Secondary | ICD-10-CM | POA: Diagnosis present

## 2023-07-03 DIAGNOSIS — K219 Gastro-esophageal reflux disease without esophagitis: Secondary | ICD-10-CM | POA: Diagnosis present

## 2023-07-03 DIAGNOSIS — E872 Acidosis, unspecified: Secondary | ICD-10-CM | POA: Diagnosis present

## 2023-07-03 DIAGNOSIS — J441 Chronic obstructive pulmonary disease with (acute) exacerbation: Secondary | ICD-10-CM | POA: Diagnosis not present

## 2023-07-03 DIAGNOSIS — Z8546 Personal history of malignant neoplasm of prostate: Secondary | ICD-10-CM | POA: Diagnosis not present

## 2023-07-03 DIAGNOSIS — I110 Hypertensive heart disease with heart failure: Secondary | ICD-10-CM | POA: Diagnosis present

## 2023-07-03 DIAGNOSIS — I2782 Chronic pulmonary embolism: Secondary | ICD-10-CM | POA: Diagnosis present

## 2023-07-03 DIAGNOSIS — J9621 Acute and chronic respiratory failure with hypoxia: Secondary | ICD-10-CM | POA: Diagnosis present

## 2023-07-03 DIAGNOSIS — I2510 Atherosclerotic heart disease of native coronary artery without angina pectoris: Secondary | ICD-10-CM | POA: Diagnosis present

## 2023-07-03 DIAGNOSIS — R64 Cachexia: Secondary | ICD-10-CM | POA: Diagnosis present

## 2023-07-03 DIAGNOSIS — I5023 Acute on chronic systolic (congestive) heart failure: Secondary | ICD-10-CM | POA: Diagnosis present

## 2023-07-03 DIAGNOSIS — Z85038 Personal history of other malignant neoplasm of large intestine: Secondary | ICD-10-CM | POA: Diagnosis not present

## 2023-07-03 HISTORY — DX: Sepsis, unspecified organism: R65.20

## 2023-07-03 LAB — CBC
HCT: 30.6 % — ABNORMAL LOW (ref 39.0–52.0)
Hemoglobin: 9.8 g/dL — ABNORMAL LOW (ref 13.0–17.0)
MCH: 30 pg (ref 26.0–34.0)
MCHC: 32 g/dL (ref 30.0–36.0)
MCV: 93.6 fL (ref 80.0–100.0)
Platelets: 231 10*3/uL (ref 150–400)
RBC: 3.27 MIL/uL — ABNORMAL LOW (ref 4.22–5.81)
RDW: 16.7 % — ABNORMAL HIGH (ref 11.5–15.5)
WBC: 6.3 10*3/uL (ref 4.0–10.5)
nRBC: 0 % (ref 0.0–0.2)

## 2023-07-03 LAB — RESPIRATORY PANEL BY PCR

## 2023-07-03 LAB — URINALYSIS, ROUTINE W REFLEX MICROSCOPIC
Bilirubin Urine: NEGATIVE
Glucose, UA: 150 mg/dL — AB
Hgb urine dipstick: NEGATIVE
Ketones, ur: NEGATIVE mg/dL
Leukocytes,Ua: NEGATIVE
Nitrite: NEGATIVE
Protein, ur: NEGATIVE mg/dL
Specific Gravity, Urine: 1.017 (ref 1.005–1.030)
pH: 6 (ref 5.0–8.0)

## 2023-07-03 LAB — COMPREHENSIVE METABOLIC PANEL WITH GFR
ALT: 19 U/L (ref 0–44)
AST: 19 U/L (ref 15–41)
Albumin: 2.7 g/dL — ABNORMAL LOW (ref 3.5–5.0)
Alkaline Phosphatase: 79 U/L (ref 38–126)
Anion gap: 9 (ref 5–15)
BUN: 13 mg/dL (ref 8–23)
CO2: 21 mmol/L — ABNORMAL LOW (ref 22–32)
Calcium: 8.3 mg/dL — ABNORMAL LOW (ref 8.9–10.3)
Chloride: 108 mmol/L (ref 98–111)
Creatinine, Ser: 0.8 mg/dL (ref 0.61–1.24)
GFR, Estimated: >60 mL/min (ref >60)
Glucose, Bld: 165 mg/dL — ABNORMAL HIGH (ref 70–99)
Potassium: 3.6 mmol/L (ref 3.5–5.1)
Sodium: 138 mmol/L (ref 135–145)
Total Bilirubin: 0.7 mg/dL (ref 0.0–1.2)
Total Protein: 6.1 g/dL — ABNORMAL LOW (ref 6.5–8.1)

## 2023-07-03 LAB — LACTIC ACID, PLASMA
Lactic Acid, Venous: 2 mmol/L (ref 0.5–1.9)
Lactic Acid, Venous: 2.7 mmol/L (ref 0.5–1.9)
Lactic Acid, Venous: 3 mmol/L (ref 0.5–1.9)

## 2023-07-03 MED ORDER — POTASSIUM CHLORIDE CRYS ER 20 MEQ PO TBCR
40.0000 meq | EXTENDED_RELEASE_TABLET | Freq: Once | ORAL | Status: AC
  Administered 2023-07-03: 40 meq via ORAL
  Filled 2023-07-03: qty 2

## 2023-07-03 MED ORDER — DAPAGLIFLOZIN PROPANEDIOL 10 MG PO TABS
10.0000 mg | ORAL_TABLET | Freq: Every day | ORAL | Status: DC
  Administered 2023-07-03 – 2023-07-05 (×3): 10 mg via ORAL
  Filled 2023-07-03 (×3): qty 1

## 2023-07-03 MED ORDER — FUROSEMIDE 10 MG/ML IJ SOLN
40.0000 mg | Freq: Two times a day (BID) | INTRAMUSCULAR | Status: AC
  Administered 2023-07-03 (×2): 40 mg via INTRAVENOUS
  Filled 2023-07-03 (×2): qty 4

## 2023-07-03 MED ORDER — POTASSIUM CHLORIDE CRYS ER 20 MEQ PO TBCR
20.0000 meq | EXTENDED_RELEASE_TABLET | Freq: Once | ORAL | Status: AC
  Administered 2023-07-03: 20 meq via ORAL
  Filled 2023-07-03: qty 1

## 2023-07-03 MED ORDER — SODIUM CHLORIDE 0.9 % IV BOLUS: 500.0000 mL | Freq: Once | INTRAVENOUS | Status: DC

## 2023-07-03 MED ORDER — SODIUM CHLORIDE 0.9 % IV BOLUS
250.0000 mL | Freq: Once | INTRAVENOUS | Status: AC
  Administered 2023-07-03: 250 mL via INTRAVENOUS

## 2023-07-03 MED ORDER — FUROSEMIDE 20 MG PO TABS: 20.0000 mg | ORAL_TABLET | Freq: Two times a day (BID) | ORAL | Status: DC

## 2023-07-03 MED ORDER — VANCOMYCIN HCL 1.25 G IV SOLR
1250.0000 mg | INTRAVENOUS | Status: DC
  Administered 2023-07-03: 1250 mg via INTRAVENOUS
  Filled 2023-07-03 (×2): qty 25

## 2023-07-03 MED ORDER — ALBUTEROL SULFATE (2.5 MG/3ML) 0.083% IN NEBU
2.5000 mg | INHALATION_SOLUTION | RESPIRATORY_TRACT | Status: DC | PRN
  Administered 2023-07-04: 2.5 mg via RESPIRATORY_TRACT
  Filled 2023-07-03: qty 3

## 2023-07-03 NOTE — Progress Notes (Signed)
  Lactic acid has been improved 4 to 2 after giving total 1.5 L of NS bolus and starting IV vancomycin  and cefepime.  History of CHF low EF 20 to 25% which is limiting excessive IV fluid resuscitation.  As lactic acid is still elevated giving another bolus of NS to 250 mL and continue NS 50 cc/h. Already on broad-spectrum IV antibiotic coverage.

## 2023-07-03 NOTE — Discharge Planning (Signed)
 RNCM notified that pt changed his mind regarding SNF placement.  RNCM contacted [employer] rep to confirm delivery of portable tank.  Rep states tank was delivered last night.   Linnaea Ahn J. Rachel Budds, RN, BSN, NCM  Transitions of Care  Nurse Case Manager  [facility] Emergency [facility]artments  Operative Services  [phone]

## 2023-07-03 NOTE — Progress Notes (Addendum)
 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 - Admitted 3/25 with acute PE and acute systolic CHF, also noted to have empyema, loculated pleural effusion, pleural fluid cultures grew strep pneumoniae, patient declined chest tube drainage, eventually stabilized and discharged home to complete 4-week course of Augmentin , seen by palliative care, refused hospice and palliative care services. - Seen in ID clinic for follow-up 4/28 was noted to be dyspneic, and hypoxic, turns out his battery on his home O2 had died and eventually sent to the ED for hypoxia from equipment failure. - He reports compliance with meds -In the ED O2 sats stabilized with home O2, 3 to 4 L, plan was for discharge home, subsequently labs noted lactic acidosis, trended up 2.5> 4.0> 2.0> 2.7, TRH was consulted, he was started on a fluid bolus and admitted early this morning. - Patient denies any symptoms, feels like he is at baseline and is adamant to go home, eventually agreed to stay - No fever or leukocytosis, labs otherwise unremarkable - CTA chest with chronic occlusion unchanged from prior, interval creased volume of loculated right pleural effusion, new small left pleural effusion   Subjective: -Upset about being hospitalized  Assessment and Plan:  Acute on chronic hypoxic respiratory failure on 3-4 L oxygen  at baseline -Initially sent to the ED on account of equipment failure, his battery on O2 was out -Clinically appears mildly volume overloaded, IV Lasix  today -Continue DuoNebs, budesonide , supplemental O2-back on baseline 3-4 L   Lactic acidosis -Clinically patient does not  appear septic, no new symptoms, no fever or leukocytosis, I suspect elevated lactic acid secondary to hypoxia while off O2 - Started on empiric antibiotics overnight, will continue today, follow-up blood cultures - Respiratory virus panel is negative,  - Discontinue IV fluids  Recent empyema, loculated effusion - Pleural fluid cultures grew strep pneumonia, patient refused chest tube drainage, discharged on Augmentin , allegedly completed 4 weeks of antibiotics - Repeat imaging with improvement - See discussion above  Acute on chronic systolic CHF Severe pulmonary hypertension, RV failure - Last echo 11/24 with a EF improved to 40-45%, moderately reduced RV, previously EF was in the 20-25% range - Appears volume overloaded, exacerbated by fluid bolus given in the ED, IV Lasix  X2 doses today -Continue Farxiga , bisoprolol  and sildenafil  -GDMT limited by hypotension -Followed by palliative care last admission, declines hospice and outpatient palliative care   Hypokalemia - Repleted   Paroxysmal atrial fibrillation -EKG showing sinus rhythm rate controlled 99 - Continue Eliquis  5 mg twice daily and bisoprolol  2.5 mg daily   Chronic pulmonary embolism/DVT -Continue Eliquis    History of CAD -Continue bisoprolol  Crestor .     History of lung cancer? 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  And XRT - Unable to find records on colon or lung cancer -Unreliable historian     DVT prophylaxis:  Eliquis  Code Status: Discussed CODE STATUS with the patient today, he agrees to DNR Family Communication: Currently no family member at bedside Disposition Plan:   Consultants:    Procedures:   Antimicrobials:    Objective: Vitals:   07/03/23 0700 07/03/23  0800 07/03/23 1100 07/03/23 1131  BP: 111/84 115/80 117/80 110/77  Pulse: 75 77 73 78  Resp: 14 14 (!) 23 20  Temp:   98 F (36.7 C) 98.6 F (37 C)   TempSrc:   Oral Oral  SpO2: 98% 99% 98% 93%  Weight:    63.6 kg  Height:    5\' 9"  (1.753 m)    Intake/Output Summary (Last 24 hours) at 07/03/2023 1212 Last data filed at 07/03/2023 0848 Gross per 24 hour  Intake 1061.23 ml  Output 675 ml  Net 386.23 ml   Filed Weights   07/03/23 1131  Weight: 63.6 kg    Examination:  General exam: Appears calm and comfortable, chronically ill cachectic HEENT: Positive JVD Respiratory system: Decreased breath sounds at the bases Cardiovascular system: S1 & S2 heard, lower Abd: nondistended, soft and nontender.Normal bowel sounds heard. Central nervous system: Alert and oriented. No focal neurological deficits. Extremities: no edema Skin: No rashes Psychiatry:  Mood & affect appropriate.     Data Reviewed:   CBC: Recent Labs  Lab 07/02/23 1204 07/03/23 0513  WBC 7.7 6.3  NEUTROABS 5.8  --   HGB 11.7* 9.8*  HCT 37.3* 30.6*  MCV 95.4 93.6  PLT 312 231   Basic Metabolic Panel: Recent Labs  Lab 07/02/23 1204 07/03/23 0513  NA 142 138  K 3.4* 3.6  CL 108 108  CO2 22 21*  GLUCOSE 102* 165*  BUN 16 13  CREATININE 0.97 0.80  CALCIUM  9.2 8.3*   GFR: Estimated Creatinine Clearance: 71.8 mL/min (by C-G formula based on SCr of 0.8 mg/dL). Liver Function Tests: Recent Labs  Lab 07/02/23 1204 07/03/23 0513  AST 24 19  ALT 23 19  ALKPHOS 102 79  BILITOT 0.9 0.7  PROT 6.6 6.1*  ALBUMIN  2.7* 2.7*   No results for input(s): "LIPASE", "AMYLASE" in the last 168 hours. No results for input(s): "AMMONIA" in the last 168 hours. Coagulation Profile: Recent Labs  Lab 07/02/23 1204  INR 1.3*   Cardiac Enzymes: No results for input(s): "CKTOTAL", "CKMB", "CKMBINDEX", "TROPONINI" in the last 168 hours. BNP (last 3 results) No results for input(s): "PROBNP" in the last 8760 hours. HbA1C: No results for input(s): "HGBA1C" in the last 72 hours. CBG: No results for input(s): "GLUCAP" in the last 168 hours. Lipid Profile: No  results for input(s): "CHOL", "HDL", "LDLCALC", "TRIG", "CHOLHDL", "LDLDIRECT" in the last 72 hours. Thyroid  Function Tests: No results for input(s): "TSH", "T4TOTAL", "FREET4", "T3FREE", "THYROIDAB" in the last 72 hours. Anemia Panel: No results for input(s): "VITAMINB12", "FOLATE", "FERRITIN", "TIBC", "IRON", "RETICCTPCT" in the last 72 hours. Urine analysis:    Component Value Date/Time   COLORURINE YELLOW 07/02/2023 0055   APPEARANCEUR CLEAR 07/02/2023 0055   APPEARANCEUR Clear 04/10/2016 1035   LABSPEC 1.017 07/02/2023 0055   PHURINE 6.0 07/02/2023 0055   GLUCOSEU 150 (A) 07/02/2023 0055   HGBUR NEGATIVE 07/02/2023 0055   BILIRUBINUR NEGATIVE 07/02/2023 0055   BILIRUBINUR Negative 04/10/2016 1035   KETONESUR NEGATIVE 07/02/2023 0055   PROTEINUR NEGATIVE 07/02/2023 0055   NITRITE NEGATIVE 07/02/2023 0055   LEUKOCYTESUR NEGATIVE 07/02/2023 0055   Sepsis Labs: @LABRCNTIP (procalcitonin:4,lacticidven:4)  ) Recent Results (from the past 240 hours)  Blood Culture (routine x 2)     Status: None (Preliminary result)   Collection Time: 07/02/23 11:50 AM   Specimen: BLOOD  Result Value Ref Range Status   Specimen Description BLOOD RIGHT ANTECUBITAL  Final   Special Requests   Final  BOTTLES DRAWN AEROBIC AND ANAEROBIC Blood Culture adequate volume   Culture   Final    NO GROWTH < 24 HOURS Performed at [facility], [address] [place], [place] [address]    Report Status PENDING  Incomplete  Blood Culture (routine x 2)     Status: None (Preliminary result)   Collection Time: 07/02/23 12:04 PM   Specimen: BLOOD  Result Value Ref Range Status   Specimen Description BLOOD LEFT ANTECUBITAL  Final   Special Requests   Final    BOTTLES DRAWN AEROBIC AND ANAEROBIC Blood Culture adequate volume   Culture   Final    NO GROWTH < 24 HOURS Performed at [facility], [address], [place], [place] [address]    Report Status PENDING  Incomplete  Respiratory (~20  pathogens) panel by PCR     Status: None   Collection Time: 07/02/23  7:38 PM   Specimen: Nasopharyngeal Swab; Respiratory  Result Value Ref Range Status   Adenovirus NOT DETECTED NOT DETECTED Final   Coronavirus 229E NOT DETECTED NOT DETECTED Final    Comment: (NOTE) The Coronavirus on the Respiratory Panel, DOES NOT test for the novel  Coronavirus (2019 nCoV)    Coronavirus HKU1 NOT DETECTED NOT DETECTED Final   Coronavirus NL63 NOT DETECTED NOT DETECTED Final   Coronavirus OC43 NOT DETECTED NOT DETECTED Final   Metapneumovirus NOT DETECTED NOT DETECTED Final   Rhinovirus / Enterovirus NOT DETECTED NOT DETECTED Final   Influenza A NOT DETECTED NOT DETECTED Final   Influenza B NOT DETECTED NOT DETECTED Final   Parainfluenza Virus 1 NOT DETECTED NOT DETECTED Final   Parainfluenza Virus 2 NOT DETECTED NOT DETECTED Final   Parainfluenza Virus 3 NOT DETECTED NOT DETECTED Final   Parainfluenza Virus 4 NOT DETECTED NOT DETECTED Final   Respiratory Syncytial Virus NOT DETECTED NOT DETECTED Final   Bordetella pertussis NOT DETECTED NOT DETECTED Final   Bordetella Parapertussis NOT DETECTED NOT DETECTED Final   Chlamydophila pneumoniae NOT DETECTED NOT DETECTED Final   Mycoplasma pneumoniae NOT DETECTED NOT DETECTED Final    Comment: Performed at [facility], [address], [place], [place] [address]     Radiology Studies: CT Angio Chest PE W and/or Wo Contrast Result Date: 07/02/2023 CLINICAL DATA:  Pulmonary embolism suspected, high probability. History of sub massive pulmonary embolism. Re-evaluation of prior empyema collection, previously drained. New hypoxia. EXAM: CT ANGIOGRAPHY CHEST WITH CONTRAST TECHNIQUE: Multidetector CT imaging of the chest was performed using the standard protocol during bolus administration of intravenous contrast. Multiplanar CT image reconstructions and MIPs were obtained to evaluate the vascular anatomy. RADIATION DOSE REDUCTION: This exam was  performed according to the departmental dose-optimization program which includes automated exposure control, adjustment of the mA and/or kV according to patient size and/or use of iterative reconstruction technique. CONTRAST:  75mL OMNIPAQUE  IOHEXOL  350 MG/ML SOLN COMPARISON:  Radiographs 07/02/2023 and 06/04/2023. Chest CTA 05/24/2023 and 07/24/2022. FINDINGS: Cardiovascular: The pulmonary arteries are well opacified with contrast to the level of the segmental branches. 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 The right upper lobe pulmonary artery remains patent. There is also mild chronic eccentric nonocclusive thrombus within segmental branches of the left upper and lower lobes. No acute pulmonary emboli are demonstrated. Atherosclerosis of the aorta, great vessels and coronary arteries. No acute systemic  arterial abnormalities are identified. The heart size is normal. Small pericardial effusion appears unchanged. Mediastinum/Nodes: There are no enlarged mediastinal, hilar or axillary lymph nodes. The thyroid  gland, trachea and esophagus demonstrate no significant findings. Lungs/Pleura: Previously demonstrated loculated right pleural effusion has decreased in volume and demonstrates less associated pleural enhancement. There is a new small dependent left pleural effusion. No pneumothorax. Interval improved aeration of the right lower lobe. There is chronic central airway thickening with scattered scarring and mild centrilobular emphysema. A 5 mm lingular nodule on image 97/6 is unchanged. No new or enlarging pulmonary nodules. Upper abdomen: No acute findings are seen in the visualized upper abdomen. Musculoskeletal/Chest wall: There is no chest wall mass or suspicious osseous finding. Mild spondylosis. Review of the MIP images confirms the above findings.  IMPRESSION: 1. No evidence of acute pulmonary embolism. 2. 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 The appearance of the pulmonary arteries is similar to previous CTA's. 3. Interval decreased volume of loculated right pleural effusion with less associated pleural enhancement. New small dependent left pleural effusion. 4. Stable small lingular nodule. 5. Aortic Atherosclerosis (ICD10-I70.0) and Emphysema (ICD10-J43.9). Electronically Signed   By: Elmon Hagedorn M.D.   On: 07/02/2023 16:26   DG Chest Port 1 View Result Date: 07/02/2023 CLINICAL DATA:  Questionable sepsis - evaluate for abnormality EXAM: PORTABLE CHEST 1 VIEW COMPARISON:  Radiographs 06/04/2023 and 05/31/2023.  CT 05/24/2023. FINDINGS: 1218 hours. Two views submitted. The heart size and mediastinal contours are stable. Previously demonstrated right pleural effusion has nearly completely resolved. Scattered scarring in both lungs appears unchanged. No confluent airspace disease or pneumothorax. The bones appear unchanged. Telemetry leads overlie the chest. IMPRESSION: Near complete resolution of previously demonstrated right pleural effusion. No evidence of acute cardiopulmonary process. Electronically Signed   By: Elmon Hagedorn M.D.   On: 07/02/2023 13:18     Scheduled Meds:  apixaban   5 mg Oral BID   bisoprolol   2.5 mg Oral Daily   budesonide   3 mg Oral BID   feeding supplement  237 mL Oral BID BM   furosemide   40 mg Intravenous BID   gabapentin   100 mg Oral TID   loratadine   10 mg Oral Daily   pantoprazole   40 mg Oral Daily   potassium chloride   40 mEq Oral Once   rOPINIRole   1 mg Oral BID   rosuvastatin   20 mg Oral Daily   sildenafil   20 mg Oral TID   sodium chloride  flush  3 mL Intravenous Q12H   sodium chloride  flush  3 mL Intravenous Q12H   Continuous  Infusions:  sodium chloride      ceFEPime (MAXIPIME) IV Stopped (07/03/23 1211)   vancomycin        LOS: 0 days    Time spent:    Deforest Fast, MD [facility]   07/03/2023, 12:12 PM

## 2023-07-03 NOTE — Plan of Care (Signed)
?  Problem: Education: ?Goal: Knowledge of disease or condition will improve ?Outcome: Progressing ?  ?Problem: Education: ?Goal: Knowledge of the prescribed therapeutic regimen will improve ?Outcome: Progressing ?  ?Problem: Education: ?Goal: Individualized Educational Video(s) ?Outcome: Progressing ?  ?Problem: Activity: ?Goal: Ability to tolerate increased activity will improve ?Outcome: Progressing ?  ?Problem: Activity: ?Goal: Will verbalize the importance of balancing activity with adequate rest periods ?Outcome: Progressing ?  ?

## 2023-07-04 DIAGNOSIS — I480 Paroxysmal atrial fibrillation: Secondary | ICD-10-CM | POA: Diagnosis not present

## 2023-07-04 DIAGNOSIS — J869 Pyothorax without fistula: Secondary | ICD-10-CM

## 2023-07-04 DIAGNOSIS — I10 Essential (primary) hypertension: Secondary | ICD-10-CM

## 2023-07-04 DIAGNOSIS — I5023 Acute on chronic systolic (congestive) heart failure: Secondary | ICD-10-CM | POA: Diagnosis not present

## 2023-07-04 DIAGNOSIS — J449 Chronic obstructive pulmonary disease, unspecified: Secondary | ICD-10-CM

## 2023-07-04 DIAGNOSIS — Z8679 Personal history of other diseases of the circulatory system: Secondary | ICD-10-CM | POA: Diagnosis not present

## 2023-07-04 DIAGNOSIS — Z8546 Personal history of malignant neoplasm of prostate: Secondary | ICD-10-CM

## 2023-07-04 LAB — COMPREHENSIVE METABOLIC PANEL WITH GFR
ALT: 21 U/L (ref 0–44)
AST: 21 U/L (ref 15–41)
Albumin: 2.6 g/dL — ABNORMAL LOW (ref 3.5–5.0)
Alkaline Phosphatase: 81 U/L (ref 38–126)
Anion gap: 7 (ref 5–15)
BUN: 18 mg/dL (ref 8–23)
CO2: 20 mmol/L — ABNORMAL LOW (ref 22–32)
Calcium: 8.9 mg/dL (ref 8.9–10.3)
Chloride: 109 mmol/L (ref 98–111)
Creatinine, Ser: 1.01 mg/dL (ref 0.61–1.24)
GFR, Estimated: >60 mL/min (ref >60)
Glucose, Bld: 133 mg/dL — ABNORMAL HIGH (ref 70–99)
Potassium: 3.9 mmol/L (ref 3.5–5.1)
Sodium: 136 mmol/L (ref 135–145)
Total Bilirubin: 0.8 mg/dL (ref 0.0–1.2)
Total Protein: 6 g/dL — ABNORMAL LOW (ref 6.5–8.1)

## 2023-07-04 LAB — CBC
HCT: 32.5 % — ABNORMAL LOW (ref 39.0–52.0)
Hemoglobin: 10.2 g/dL — ABNORMAL LOW (ref 13.0–17.0)
MCH: 29.7 pg (ref 26.0–34.0)
MCHC: 31.4 g/dL (ref 30.0–36.0)
MCV: 94.5 fL (ref 80.0–100.0)
Platelets: 265 10*3/uL (ref 150–400)
RBC: 3.44 MIL/uL — ABNORMAL LOW (ref 4.22–5.81)
RDW: 17.1 % — ABNORMAL HIGH (ref 11.5–15.5)
WBC: 11.2 10*3/uL — ABNORMAL HIGH (ref 4.0–10.5)
nRBC: 0.2 % (ref 0.0–0.2)

## 2023-07-04 LAB — URINE CULTURE: Special Requests: NORMAL

## 2023-07-04 LAB — LACTIC ACID, PLASMA: Lactic Acid, Venous: 1.5 mmol/L (ref 0.5–1.9)

## 2023-07-04 MED ORDER — FLUTICASONE PROPIONATE 50 MCG/ACT NA SUSP
2.0000 | Freq: Two times a day (BID) | NASAL | Status: DC
  Administered 2023-07-04 – 2023-07-05 (×2): 2 via NASAL
  Filled 2023-07-04: qty 16

## 2023-07-04 MED ORDER — SODIUM CHLORIDE 0.9 % IV SOLN: 2.0000 g | Freq: Two times a day (BID) | INTRAVENOUS | Status: DC

## 2023-07-04 MED ORDER — FUROSEMIDE 40 MG PO TABS
40.0000 mg | ORAL_TABLET | Freq: Every day | ORAL | Status: DC
  Administered 2023-07-04 – 2023-07-05 (×2): 40 mg via ORAL
  Filled 2023-07-04 (×2): qty 1

## 2023-07-04 NOTE — Assessment & Plan Note (Addendum)
 Echocardiogram with reduced LV systolic function EF 25 to 30%, global hypokinesis, mild LVH, interventricular septum is flattened in systole and diastole, RV systolic function with mild reduction, RVSP 37,7, mild to moderate TR, no aortic stenosis.   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  Patient was placed on IV furosemide  for diuresis with improvement in his volume status.  He was transitioned to loop diuretic with good toleration.   Continue with SGLT 2 inh and bisoprolol .  Continue with sildenafil  for pulmonary hypertension.   Acute on chronic hypoxemic respiratory failure due to cardiogenic pulmonary edema.  Continue supplemental 02 per [place] Currently 02 saturation 92 on 4 L/min per [place]

## 2023-07-04 NOTE — Plan of Care (Signed)
?  Problem: Education: ?Goal: Knowledge of disease or condition will improve ?Outcome: Progressing ?  ?Problem: Education: ?Goal: Knowledge of the prescribed therapeutic regimen will improve ?Outcome: Progressing ?  ?Problem: Education: ?Goal: Individualized Educational Video(s) ?Outcome: Progressing ?  ?Problem: Activity: ?Goal: Ability to tolerate increased activity will improve ?Outcome: Progressing ?  ?Problem: Activity: ?Goal: Will verbalize the importance of balancing activity with adequate rest periods ?Outcome: Progressing ?  ?

## 2023-07-04 NOTE — Assessment & Plan Note (Signed)
 Continue bisoprolol  and anticoagulation with apixaban .

## 2023-07-04 NOTE — Evaluation (Signed)
 Occupational Therapy Evaluation Patient Details Name: Bruce [name] MRN: [id number] DOB: 05-Mar-1946 Today's Date: 07/04/2023   History of Present Illness   78 yo male presents to [facility] on 4/28 from a doctors office due to SOB and sepsis concerns. Of note, pt with recent admissions in March 2025 due to SOB with CTA chest showing right mainstem PE now s/p thromolysis and in February 2025 with acute on chronic respiratory failure and volume overload, flu A. PMH: prostate cancer, lung cancer, colon cancer, CHF, COPD, coronary artery disease, GERD, HTN, MI, PVD, HLD, PAD, Hx of DVT & PE, NSTEMI, Afib, pulmonary hypertension, medical noncompliance     Clinical Impressions At baseline, pt is largely Independent to Mod I with ADLs, IADLs, and functional mobility with a SPC or RW. At baseline, pt drives and works. Pt reports multiple recent falls. Pt also reports recent assistance with setting up a medication planner, which he reports has been helpful. Pt now presents with decreased activity tolerance, impaired cognition with decreased safety awareness and decreased insight into deficits, decreased balance, and decreased safety and independence with functional tasks. Pt currently demonstrates ability to complete ADLs Independent to Supervision and functional step-pivot transfers with a RW with Supervision. Pt O2 sat >/90% on 4L continuous O2 through nasal cannula throughout session, but pt with increased labor of breathing and requiring extending rest breaks with minimal activity. Pt will benefit from acute skilled OT services to address deficits outlined below and increase safety and independence with functional tasks. Post acute discharge, OT recommends HH OT, including a home safety assessment, as well as a shower chair. OT educated pt in recommendations and reasons for recommendations. However, pt reports he does not want HH OT or shower chair at this time.      If plan is discharge home, recommend the  following:   A little help with walking and/or transfers;A little help with bathing/dressing/bathroom;Assistance with cooking/housework;Direct supervision/assist for medications management;Assist for transportation;Help with stairs or ramp for entrance;Direct supervision/assist for financial management     Functional Status Assessment   Patient has had a recent decline in their functional status and demonstrates the ability to make significant improvements in function in a reasonable and predictable amount of time.     Equipment Recommendations   Other (comment) (OT recommends shower chair. However, pt declines shower chair at this time.)     Recommendations for Other Services         Precautions/Restrictions   Precautions Precautions: Fall;Other (comment) Precaution/Restrictions Comments: watch O2 Restrictions Weight Bearing Restrictions Per Provider Order: No     Mobility Bed Mobility Overal bed mobility: Modified Independent                  Transfers Overall transfer level: Needs assistance Equipment used: Rolling walker (2 wheels) Transfers: Sit to/from Stand, Bed to chair/wheelchair/BSC Sit to Stand: Modified independent (Device/Increase time)     Step pivot transfers: Supervision     General transfer comment: Supervision for safety      Balance Overall balance assessment: History of Falls, Needs assistance Sitting-balance support: No upper extremity supported, Feet supported Sitting balance-Leahy Scale: Good     Standing balance support: No upper extremity supported, Single extremity supported, Bilateral upper extremity supported, During functional activity Standing balance-Leahy Scale: Fair Standing balance comment: able to briefly hold static stand without UE support; requires UE support in dynamic standing and during step-pivot transfers to maintain balance  ADL either performed or assessed with  clinical judgement   ADL Overall ADL's : Needs assistance/impaired Eating/Feeding: Independent;Sitting   Grooming: Set up;Sitting   Upper Body Bathing: Supervision/ safety;Sitting   Lower Body Bathing: Supervison/ safety;Sit to/from stand   Upper Body Dressing : Modified independent;Sitting   Lower Body Dressing: Supervision/safety;Sit to/from stand   Toilet Transfer: Supervision/safety;Rolling walker (2 wheels);BSC/3in1 (step-pivot transfer) Toilet Transfer Details (indicate cue type and reason): simulated bed to chair Toileting- Clothing Manipulation and Hygiene: Supervision/safety;Sit to/from stand         General ADL Comments: Pt with decreased activity tolerance, fatiguing quickly and becoming SOB with minimal activity. Pt requiring seated rest break of approx. 2 min following step-pivot transfer with RW. Pt O2 sat remained >/90%, but pt with increased labor of breathing.     Vision Baseline Vision/History: 1 Wears glasses (for reading) Ability to See in Adequate Light: 0 Adequate (with glasses) Patient Visual Report: No change from baseline       Perception         Praxis         Pertinent Vitals/Pain Pain Assessment Pain Assessment: Faces Faces Pain Scale: Hurts even more Pain Location: B LE, worse in R ankle Pain Descriptors / Indicators: Aching, Discomfort, Grimacing, Guarding, Numbness, Other (Comment) ("like bugs crawling and bitting" intermittently) Pain Intervention(s): Limited activity within patient's tolerance, Monitored during session, Repositioned     Extremity/Trunk Assessment Upper Extremity Assessment Upper Extremity Assessment: Right hand dominant;Overall [facility] for tasks assessed (B shoulder strength grossly 4/5; B UE strength otherwise grossly 4+/5; B UE coordination, ROM, and sensation WFL)   Lower Extremity Assessment Lower Extremity Assessment: Defer to PT evaluation       Communication Communication Communication: No apparent  difficulties   Cognition Arousal: Alert Behavior During Therapy: WFL for tasks assessed/performed, Agitated (Largely WFL but with intermittent mild agitation when discussing home set-up and recommendation for HH OT, including home safety assessment) Cognition: Cognition impaired, No family/caregiver present to determine baseline     Awareness: Intellectual awareness intact, Online awareness intact Memory impairment (select all impairments): Short-term memory Attention impairment (select first level of impairment): Alternating attention Executive functioning impairment (select all impairments): Problem solving OT - Cognition Comments: Pt AAOx4 and agreeable to participation in session. Pt cognition largely [facility] for tasks assessed but with noted deficits above.                 Following commands: Intact       Cueing  General Comments   Cueing Techniques: Verbal cues;Visual cues;Gestural cues  O2 sat >/ 90% on 4L continuous O2 throughout session, but pt with increased labor of breathing noted with minimal exertion.   Exercises     Shoulder Instructions      Home Living Family/patient expects to be discharged to:: Private residence Living Arrangements: Non-relatives/Friends Available Help at Discharge: Friend(s);Available PRN/intermittently (Roommate able to assist with some meal prep/home management tasks.) Type of Home: Mobile home Home Access: Stairs to enter;Other (comment) (Pt reports landlord has placed plywood sheets on half of stairwell that is next to handrail to "make a ramp." Pt further reports plywood on stairs is very steep and slippery.) Entrance Stairs-Number of Steps: 7-8 in front, 4-5 in back Entrance Stairs-Rails: Right (Pt reports he has to walk on plywood sheets placed on half of stairway to reach the handrail) Home Layout: One level     Bathroom Shower/Tub: Chief Strategy Officer: Standard Bathroom Accessibility: No   Home Equipment:  Rolling Walker (2 wheels);Cane - single point; home O2 concentrator; portable O2 concentrator   Additional Comments: Pt reports he has an adult son, but that son is not able to assist.      Prior Functioning/Environment Prior Level of Function : Independent/Modified Independent;Driving;History of Falls (last six months);Working/employed             Mobility Comments: Pt reports typically using SPC for maobility but states he has had several recent falls and now plans to use RW. Pt reprots difficulty walking the full length of his house due to SOB and fatigue. Pt reports one fall approx. 5 weeks ago when he slipped on stairs and hit is "butt bone" and further reports scab at this site for multiple weeks with RN notified. 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 Pt reports difficulty making meals or doing home management tasks due to SOB and fatigue. Pt further reports he has difficulty managing medications from pill bottles but has recently had assistance for setting up pill planner and that has been helpful.    OT Problem List: Decreased activity tolerance;Impaired balance (sitting and/or standing);Cardiopulmonary status limiting activity   OT Treatment/Interventions: Self-care/ADL training;Energy conservation;Therapeutic exercise;DME and/or AE instruction;Therapeutic activities;Patient/family education;Balance training      OT Goals(Current goals can be found in the care plan section)   Acute Rehab OT Goals Patient Stated Goal: to return home and be able to keep working OT Goal Formulation: With patient Time For Goal Achievement: 07/18/23 Potential to Achieve Goals: Good ADL Goals Pt Will Perform Grooming: with modified independence;standing Pt Will Perform Lower Body Bathing: with modified independence;sitting/lateral leans;sit to/from stand Pt Will Perform Lower Body Dressing: with modified  independence;sitting/lateral leans;sit to/from stand Pt Will Transfer to Toilet: with modified independence;regular height toilet;ambulating (with least restrictive AD) Additional ADL Goal #1: Patient will demonstrate understanding of education in fall prevention strategies through teah back with handout provided. Additional ADL Goal #2: Patient will demonstrate ability to Independently state 3 energy conservation strategies to increase safety and independenec with functional tasks.   OT Frequency:  Min 2X/week    Co-evaluation              AM-PAC OT "6 Clicks" Daily Activity     Outcome Measure Help from another person eating meals?: None Help from another person taking care of personal grooming?: A Little Help from another person toileting, which includes using toliet, bedpan, or urinal?: A Little Help from another person bathing (including washing, rinsing, drying)?: A Little Help from another person to put on and taking off regular upper body clothing?: None Help from another person to put on and taking off regular lower body clothing?: A Little 6 Click Score: 20   End of Session Equipment Utilized During Treatment: Rolling walker (2 wheels);Oxygen  Nurse Communication: Mobility status;Other (comment) (Pt reports scab at coccyx due to fall about 5 weeks ago.)  Activity Tolerance: Patient tolerated treatment well;Patient limited by fatigue Patient left: in bed;with call bell/phone within reach;Other (comment) (sitting EOB with PT entering room)  OT Visit Diagnosis: Unsteadiness on feet (R26.81);Repeated falls (R29.6);History of falling (Z91.81);Other (comment) (decreased activity tolerance)                Time: 0981-1914 OT Time Calculation (min): 22 min Charges:  OT General Charges $OT Visit: 1 Visit OT Evaluation $OT Eval Low Complexity: 1 Low  [address]" M., OTR/L, MA Acute Rehab [phone]  Angelyn Barges Donn Zanetti 07/04/2023, 1:50 PM

## 2023-07-04 NOTE — Assessment & Plan Note (Signed)
Hx of DVT.  Continue anticoagulation with apixaban.

## 2023-07-04 NOTE — Assessment & Plan Note (Addendum)
 At the time of discharge his renal function has a serum cr of 1,19 with K at 3,6 and serum bicarbonate at 22 Na 138  Plan to continue diuresis with furosemide  and SGLT 2 inh.  Added Kcl supplementation  Follow up renal function and electrolytes as outpatient.

## 2023-07-04 NOTE — TOC Progression Note (Signed)
 Transition of Care [facility]) - Progression Note    Patient Details  Name: Bruce [name] MRN: [id number] Date of Birth: 06-04-1945  Transition of Care [facility]) CM/SW Contact  Jennett Model, RN Phone Number: 07/04/2023, 2:41 PM  Clinical Narrative:    Per pt eval no PT f/u needed.  Patient does not want any HH services also.         Expected Discharge Plan and Services                                               Social Determinants of Health (SDOH) Interventions SDOH Screenings   Food Insecurity: Food Insecurity Present (07/03/2023)  Housing: High Risk (07/03/2023)  Transportation Needs: No Transportation Needs (07/03/2023)  Recent Concern: Transportation Needs - Unmet Transportation Needs (05/25/2023)  Utilities: Not At Risk (07/03/2023)  Depression (PHQ2-9): Medium Risk (06/26/2023)  Financial Resource Strain: High Risk (04/12/2022)  Physical Activity: Inactive (02/05/2017)  Social Connections: Unknown (07/03/2023)  Stress: Stress Concern Present (02/05/2017)  Tobacco Use: Medium Risk (07/02/2023)    Readmission Risk Interventions    05/28/2023    5:06 PM  Readmission Risk Prevention Plan  Transportation Screening Complete  PCP or Specialist Appt within 5-7 Days Complete  Home Care Screening Complete  Medication Review (RN CM) Complete

## 2023-07-04 NOTE — Progress Notes (Addendum)
 Pharmacy Antibiotic Note  LOC Bruce [name] is a 78 y.o. male admitted on 07/02/2023 with acute on chronic respiratory failure.  Pharmacy has been consulted for cefepime/vancomycin  dosing sepsis concerns. PMH notable for strep pneumoniae empyema hospitalization s/p IR thoracentesis and completing prolonged Abx course after refusing chest tube placement.   -Cta: negative for PE; decr R. pleural effusion, new small left pleural effusion   WBC 6.3>11.2,  LA 3.0>1.5,  afebrile.  SCr 0.8>1.01.  CrCl 56.8 ml/min 4/28 Blood cultures  no growth to date and urine culture: multiple species, suggested recollection/Final.   -3/27 Pleural Cx: pan sensitive strep pneumo (Augmentin  for 4 weeks to finish 4/24)   Plan: -Adjust Cefepime dose to 2g IV every 12 hours due to CrCl < 60 ml/min.  -Continue Vancomycin  1250mg  IV every 24 hours (AUC 465) -Monitor renal function -Follow up signs of clinical improvement, LOT, culture results, and de-escalation of antibiotics   Height: 5\' 9"  (175.3 cm) Weight: 63.6 kg (140 lb 3.4 oz) IBW/kg (Calculated) : 70.7  Temp (24hrs), Avg:97.6 F (36.4 C), Min:97.3 F (36.3 C), Max:98.2 F (36.8 C)  Recent Labs  Lab 07/02/23 1204 07/02/23 1221 07/02/23 2305 07/03/23 0508 07/03/23 0513 07/03/23 0647 07/03/23 1314 07/04/23 0242  WBC 7.7  --   --   --  6.3  --   --  11.2*  CREATININE 0.97  --   --   --  0.80  --   --  1.01  LATICACIDVEN  --    < > 4.0* 2.0*  --  2.7* 3.0* 1.5   < > = values in this interval not displayed.    Estimated Creatinine Clearance: 56.8 mL/min (by C-G formula based on SCr of 1.01 mg/dL).    Antimicrobials this admission: Cefepime 4/29 >>  Vancomycin  4/29 >>   Microbiology results: 4/28 Bcx: ngtd 4/28 Resp panel:  neg 4/28 Ucx: multip species, suggest recollection/Final  Prior to this admission: 3/27 Pleural Cx: pan-sens strep pneumo (4w of Augmentin )  Thank you for allowing pharmacy to be a part of this patient's care.   Alisa Irish, RPh Clinical Pharmacist 07/04/2023 11:42 AM  [phone] until 3:00 PM then Please check AMION for all [facility] Pharmacy phone numbers After 10:00 PM, call Main Pharmacy [phone]

## 2023-07-04 NOTE — Assessment & Plan Note (Addendum)
 Continue bronchodilator therapy  No signs of acute exacerbation.   Patient with chronic hypoxemic respiratory failure, on supplemental 02 at home. Smoking cessation counseling

## 2023-07-04 NOTE — Assessment & Plan Note (Signed)
 Follow up as outpatient.

## 2023-07-04 NOTE — Assessment & Plan Note (Addendum)
 Patient has been treated for empyema, with oral antibiotic therapy He completed therapy  Follow up ID as outpatient, he was referred to the ED for hypoxemia.  Interval decreased volume loculated right pleural effusion with less associated pleural enhancement.  Wbc is 8,8, he has been afebrile  Plan to follow up as outpatient, no further antibiotic therapy indicated at this point in time.

## 2023-07-04 NOTE — Plan of Care (Signed)
  Problem: Education: Goal: Knowledge of disease or condition will improve Outcome: Progressing   Problem: Education: Goal: Knowledge of General Education information will improve Description: Including pain rating scale, medication(s)/side effects and non-pharmacologic comfort measures Outcome: Progressing   Problem: Health Behavior/Discharge Planning: Goal: Ability to manage health-related needs will improve Outcome: Progressing   Problem: Activity: Goal: Risk for activity intolerance will decrease Outcome: Progressing

## 2023-07-04 NOTE — TOC CM/SW Note (Signed)
 Transition of Care [facility]) - Inpatient Brief Assessment   Patient Details  Name: Bruce [name] MRN: [id number] Date of Birth: 1945-07-12  [facility]) CM/SW Contact:    Arron Big, LCSWA Phone Number: 07/04/2023, 11:15 AM   Clinical Narrative: Per daily meeting with treatment team, patient is stating he wants to go home. Awaiting PT/OT eval.   TOC familiar with patient. CSW reviewed SDOH needs and added food resources to patients AVS.   TOC will continue to follow as needed.    Transition of Care Asessment: Insurance and Status: Insurance coverage has been reviewed Patient has primary care physician: Yes Home environment has been reviewed: Home w/ roommate Prior level of function:: Independent Prior/Current Home Services:  (Unknown) Social Drivers of Health Review: SDOH reviewed interventions complete Readmission risk has been reviewed: Yes Transition of care needs: no transition of care needs at this time

## 2023-07-04 NOTE — Assessment & Plan Note (Addendum)
 Continue with bisoprolol  for blood pressure control.

## 2023-07-04 NOTE — Evaluation (Signed)
 Physical Therapy Evaluation Patient Details Name: Bruce [name] MRN: [id number] DOB: [birth year] Today's Date: 07/04/2023  History of Present Illness  78 yo male presents to [facility] on 4/28 from a doctors office due to SOB and sepsis concerns. Of note, pt with recent admissions in March 2025 due to SOB with CTA chest showing right mainstem PE now s/p thromolysis and in February 2025 with acute on chronic respiratory failure and volume overload, flu A. PMH: prostate cancer, lung cancer, colon cancer, CHF, COPD, coronary artery disease, GERD, HTN, MI, PVD, HLD, PAD, Hx of DVT & PE, NSTEMI, Afib, pulmonary hypertension, medical noncompliance   Clinical Impression  Pt admitted with above. Pt unfortunately with poor living situation and minimal support system. Pt with c/o R ankle pain greatly limiting ability to ambulate who has been using RW when he does but has to turn it sideways to get down his hallway in his mobile home. 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 Pt very particular and set in his ways. Pt not interested in additional assistive devices ie. Rollator as it wont fit in the hallway. Pt also with significant SOB with mobility. Spo2 dec to 72% on 4Lo2 via [place] during mobility requiring > 5 min to recover SpO2 into 80s. Pt with 2 primary concerns of receiving breathing treatments and oxycodone  for R ankle pain management. Although not optimal, pt at a level of function to return home. I would recommend ortho consult regarding his R ankle in hopes for better pain management to improve safety with ambulation. Acute PT to cont to follow while in hospital however patient is not interested in follow up PT post d/c.         If plan is discharge home, recommend the following:     Can travel by private vehicle        Equipment Recommendations None recommended by PT (pt has RW)  Recommendations for Other Services       Functional  Status Assessment       Precautions / Restrictions Precautions Precautions: Fall;Other (comment) Precaution/Restrictions Comments: watch O2 Restrictions Weight Bearing Restrictions Per Provider Order: No      Mobility  Bed Mobility Overal bed mobility: Modified Independent             General bed mobility comments: HOB elevated, no use of rails    Transfers Overall transfer level: Needs assistance Equipment used: Rolling walker (2 wheels) Transfers: Sit to/from Stand, Bed to chair/wheelchair/BSC Sit to Stand: Modified independent (Device/Increase time)           General transfer comment: verbal cues for safe hand placment, pt with minimal to no WBing through R foot during standing    Ambulation/Gait Ambulation/Gait assistance: Supervision Gait Distance (Feet): 10 Feet (from one side of the bed to the other) Assistive device: Rolling walker (2 wheels) Gait Pattern/deviations: Step-to pattern Gait velocity: wfl     General Gait Details: pt varied between hopping on L foot and gently placing L foot on the ground during R LE advance phase. Pt very dependent on bilat UEs, ambulation limited by R ankle pain  Stairs            Wheelchair Mobility     Tilt Bed    Modified Rankin (Stroke Patients Only)       Balance Overall balance assessment: History of Falls, Needs assistance Sitting-balance support: No upper extremity supported, Feet supported Sitting  balance-Leahy Scale: Good     Standing balance support: No upper extremity supported, Single extremity supported, Bilateral upper extremity supported, During functional activity Standing balance-Leahy Scale: Fair Standing balance comment: able to briefly hold static stand without UE support; requires UE support in dynamic standing and during step-pivot transfers to maintain balance                             Pertinent Vitals/Pain Pain Assessment Pain Assessment: Faces Faces Pain Scale:  Hurts whole lot Pain Location: R ankle pain Pain Descriptors / Indicators: Aching, Discomfort, Grimacing, Guarding, Numbness, Other (Comment) ("like bugs crawling and bitting" intermittently) Pain Intervention(s): Limited activity within patient's tolerance    Home Living Family/patient expects to be discharged to:: Private residence Living Arrangements: Non-relatives/Friends Available Help at Discharge: Friend(s);Available PRN/intermittently (Roommate able to assist with some meal prep/home management tasks.) Type of Home: Mobile home Home Access: Stairs to enter;Other (comment) (Pt reports landlord has placed plywood sheets on half of stairwell that is next to handrail to "make a ramp." Pt further reports plywood on stairs is very steep and slippery.) Entrance Stairs-Rails: Right (Pt reports he has to walk on plywood sheets placed on half of stairway to reach the handrail) Entrance Stairs-Number of Steps: 7-8 in front, 4-5 in back   Home Layout: One level Home Equipment: Agricultural consultant (2 wheels);Cane - single point (home O2 concentrator; portable O2 concentrator) Additional Comments: Pt reports he has an adult son, but that son is not able to assist.    Prior Function Prior Level of Function : Independent/Modified Independent;Driving;History of Falls (last six months);Working/employed             Mobility Comments: Pt reports typically using SPC for mobility but states he has had several recent falls and now plans to use RW. Pt reports difficulty walking the full length of his house due to SOB and fatigue. Pt reports having to turn the walker sideways to go downt he hallway because the hallway is too narrow ADLs Comments: indep, states he drives and uses his L foot since his R foot has been so painful for the last few months     Extremity/Trunk Assessment   Upper Extremity Assessment Upper Extremity Assessment: Defer to OT evaluation    Lower Extremity Assessment Lower  Extremity Assessment: RLE deficits/detail RLE Deficits / Details: limited ankle ROM due to pain       Communication   Communication Communication: Impaired Factors Affecting Communication: Hearing impaired    Cognition Arousal: Alert Behavior During Therapy: WFL for tasks assessed/performed, Agitated (Largely WFL but with intermittent mild agitation when discussing assistive devices and problem solving on home set up)   PT - Cognitive impairments: No family/caregiver present to determine baseline                         Following commands: Intact       Cueing Cueing Techniques: Verbal cues, Visual cues, Gestural cues     General Comments General comments (skin integrity, edema, etc.): Pts SpO2 down to 72% on 4LO2 via [place] during mobility, pt recovered to 87% s/p 5 min. Pt with noted SOB. Pt stating he usually stays on a mask at home. RN came in to assess patient. Pt remains able to carry on conversation    Exercises     Assessment/Plan    PT Assessment Patient needs continued PT services  PT Problem List  Decreased strength;Decreased range of motion;Decreased activity tolerance;Decreased balance;Decreased mobility;Pain       PT Treatment Interventions DME instruction;Gait training;Stair training;Functional mobility training;Therapeutic activities;Therapeutic exercise;Balance training    PT Goals (Current goals can be found in the Care Plan section)  Acute Rehab PT Goals Patient Stated Goal: home TODAY PT Goal Formulation: With patient Time For Goal Achievement: 07/18/23 Potential to Achieve Goals: Good    Frequency Min 2X/week     Co-evaluation               AM-PAC PT "6 Clicks" Mobility  Outcome Measure Help needed turning from your back to your side while in a flat bed without using bedrails?: None Help needed moving from lying on your back to sitting on the side of a flat bed without using bedrails?: None Help needed moving to and from a bed to a  chair (including a wheelchair)?: None Help needed standing up from a chair using your arms (e.g., wheelchair or bedside chair)?: None Help needed to walk in hospital room?: None Help needed climbing 3-5 steps with a railing? : A Little 6 Click Score: 23    End of Session Equipment Utilized During Treatment: Oxygen  Activity Tolerance: Patient limited by pain;Patient limited by fatigue Patient left: in bed;with call bell/phone within reach;with nursing/sitter in room Nurse Communication: Mobility status (drop in SpO2 and his request for a breathing treatment) PT Visit Diagnosis: Unsteadiness on feet (R26.81);Muscle weakness (generalized) (M62.81)    Time: 1610-9604 PT Time Calculation (min) (ACUTE ONLY): 42 min   Charges:   PT Evaluation $PT Eval Low Complexity: 1 Low PT Treatments $Gait Training: 8-22 mins $Therapeutic Activity: 8-22 mins PT General Charges $$ ACUTE PT VISIT: 1 Visit         Renaee Caro, PT, DPT Acute Rehabilitation Services Secure chat preferred Office #: [phone]   Jenna Moan 07/04/2023, 2:28 PM

## 2023-07-04 NOTE — Progress Notes (Signed)
 Progress Note   Patient: Bruce [name] OHY:[id number] DOB: 14-Jan-1946 DOA: 07/02/2023     1 DOS: the patient was seen and examined on 07/04/2023   Brief hospital course: Chronically ill 77/M with COPD, ongoing tobacco use, chronic respiratory failure on 3 to 4 L home O2, CAD, chronic systolic CHF with EF of 20%, severe RV failure, severe pulmonary hypertension, also reported history of prior lung, prostate and colon CA, DVT/PE, noncompliance. - Admitted 3/25 with acute PE and acute systolic CHF, also noted to have empyema, loculated pleural effusion, pleural fluid cultures grew strep pneumoniae, patient declined chest tube drainage, eventually stabilized and discharged home to complete 4-week course of Augmentin , seen by palliative care, refused hospice and palliative care services. - Seen in ID clinic for follow-up 4/28 was noted to be dyspneic, and hypoxic, turns out his battery on his home O2 had died and eventually sent to the ED for hypoxia from equipment failure. - He reports compliance with meds -In the ED O2 sats stabilized with home O2, 3 to 4 L, plan was for discharge home, subsequently labs noted lactic acidosis, trended up 2.5> 4.0> 2.0> 2.7, TRH was consulted, he was started on a fluid bolus and admitted early this morning. - Patient denies any symptoms, feels like he is at baseline and is adamant to go home, eventually agreed to stay - No fever or leukocytosis, labs otherwise unremarkable - CTA chest with chronic occlusion unchanged from prior, interval creased volume of loculated right pleural effusion, new small left pleural effusion  Assessment and Plan: * Acute on chronic systolic CHF (congestive heart failure) (HCC) Echocardiogram with reduced LV systolic function EF 25 to 30%, global hypokinesis, mild LVH, interventricular septum is flattened in systole and diastole, RV systolic function with mild reduction, RVSP 37,7, mild to moderate TR, no aortic stenosis.   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  On bisoprolol .  Continue with sildenafil   Acute on chronic hypoxemic respiratory failure due to cardiogenic pulmonary edema.  Continue supplemental 02 per [place] Currently 02 saturation 90 on 4 L/min per [place]   COPD (chronic obstructive pulmonary disease) (HCC) Continue bronchodilator therapy  No signs of acute exacerbation.   Patient with chronic hypoxemic respiratory failure, on supplemental 02 at home. Smoking cessation counseling   Paroxysmal atrial fibrillation (HCC) Continue bisoprolol  and anticoagulation with apixaban .   History of CAD (coronary artery disease) No chest pain, continue blood pressure control and statin therapy   Hypokalemia Renal function stable with serum cr at 1,0 with K at 3,9 and serum bicarbonate at 20  Na 136  Continue close follow up renal function and electrolytes.   Hypertension Continue blood pressure monitoring  Continue with bisoprolol .   History of prostate cancer Follow up as outpatient.   History of pulmonary embolism Hx of DVT Continue anticoagulation with apixaban    Empyema lung (HCC) Patient has been treated for empyema, with oral antibiotic therapy He completed therapy  Follow up ID as outpatient, he was referred to the ED for hypoxemia.  CT chest with no pulmonary embolism. Interval decreased volume loculated right pleural effusion with less associated pleural enhancement.  Wbc is 11  and lactic acid is down to 1,5 Plan to stop antibiotic therapy and follow up cell count in.         Subjective:  Patient is feeling better, dyspnea continue to improve, it is worse with exertion and improved with rest, no chest pain, no PND or orthopnea   Physical Exam: Vitals:   07/03/23 1820 07/03/23 2017 07/04/23 0032 07/04/23 0733  BP: 98/66 99/69 106/74  113/76  Pulse: 70 83 69 72  Resp: 20 18 18 18   Temp: 98.2 F (36.8 C) 97.6 F (36.4 C) 97.6 F (36.4 C) (!) 97.3 F (36.3 C)  TempSrc: Oral Oral Oral Oral  SpO2: 95% 91%  (!) 88%  Weight:      Height:       Neurology awake and alert ENT with mild pallor Cardiovascular with S1 and S2 present and regular with no gallops or rubs, positive systolic murmur at the right lower sternal border No JVD No lower extremity edema Respiratory with prolonged expiratory phase with positive rhonchi bilaterally with no wheezing or rales Abdomen with no distention   Data Reviewed:    Family Communication: no family at the bedside   Disposition: Status is: Inpatient Remains inpatient appropriate because: recovering respiratory failure   Planned Discharge Destination: Home     Author: Albertus Alt, MD 07/04/2023 7:59 AM  For on call review [email or web address].

## 2023-07-04 NOTE — Progress Notes (Signed)
   07/04/23 1725  Mobility  Activity Dangled on edge of bed  Level of Assistance Standby assist, set-up cues, supervision of patient - no hands on  Assistive Device None  Range of Motion/Exercises Right leg;Left leg  Activity Response Tolerated fair  Mobility Referral Yes  Mobility visit 1 Mobility  Mobility Specialist Start Time (ACUTE ONLY) 1725  Mobility Specialist Stop Time (ACUTE ONLY) 1737  Mobility Specialist Time Calculation (min) (ACUTE ONLY) 12 min   Mobility Specialist: Progress Note  Pre-Mobility:       SpO2 86% 5L Post-Mobility:     SpO2 87-91% 5L  Pt agreeable to mobility session - received in bed. C/o RLE soreness, SOB (Cues PLB). and nasal congestion. - RN notified. Pt required encouragement to sit EOB - further mobility deferred d/t eating dinner. Pt appeared agitated and displaying verbal aggression throughout session stating "that nurse has been giving me an attitude, I like attitudes... if she throws something at me ill throw myself against the wall and make her look like she did it." MS was able to deescalate by changing the subject and asking the needs of the patient provided a brown blanket per request. Pt noticed SOB and stated "I need to control my breathing." At EOS pt stated "Im going home tomorrow one way or the other." Returned to bed with all needs met - call bell within reach.   [name], BS Mobility Specialist Please contact via SecureChat or  Rehab office at [phone].

## 2023-07-04 NOTE — Assessment & Plan Note (Signed)
 No chest pain, continue blood pressure control and statin therapy

## 2023-07-04 NOTE — Hospital Course (Addendum)
 Mr. Pingleton was admitted to the hospital with the working diagnosis of acute on chronic systolic heart failure.   77/M with COPD,  CAD, heart failure, severe RV failure, severe pulmonary hypertension, also reported history of prior lung, prostate and colon CA, DVT/PE, noncompliance. Recent hospitalization 03/20 to 06/06/23, when he was diagnosed with acute on chronic pulmonary embolism. He had thoracentesis on the right, with positive culture for streptococcus pneumoniae. He declined chest tube placement and was discharged on oral antibiotic therapy. He completed Augmentin  by 07/01/23.  Seen in ID clinic for follow-up 4/28 was noted to be dyspneic, and hypoxic, turns out his battery on his home O2 had died and eventually sent to the ED for hypoxia from equipment failure. He reported compliance with meds On his initial physical examination  his blood pressure was 120/77, HR 85, RR 15 and 02 saturation 99% on supplemental 02 per [place].  Lungs with bilateral wheezing with no rhonchi or rales, heart with S1 and S2 present and regular, with no gallops, abdomen with no distention and no lower extremity edema.   Na 142, K 3,4 Cl 108 bicarbonate 22 glucose 102, bun 16 cr 0,97  Lactic acid 2,2, 2,5 4.0 2.7 3.0 and 1,5  Wbc 7.7 hgb 11.7 plt 312  Urine analysis SG 1,017, negative protein, negative hgb and negative leukocytes.   Chest radiograph with right rotation with no cardiomegaly of infiltrates. Small right pleural effusion.  CT chest with no evidence of acute pulmonary embolism.  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  Interval decreased volume of loculated right pleural effusion with less associated pleural enhancement. New small depended left pleural effusion.  Stable small lingular nodule.   EKG 100 bpm, right axis deviation, right  bundle branch block, qtc 483, sinus rhythm with no significant ST segment or T wave changes.   Patient was placed on IV furosemide  and empiric antibiotic therapy Infection was ruled out and antibiotic were discontinued 05/01 dyspnea and oxygenation back to baseline, plan for follow up as outpatient.

## 2023-07-05 ENCOUNTER — Telehealth (HOSPITAL_COMMUNITY): Payer: Self-pay

## 2023-07-05 DIAGNOSIS — J449 Chronic obstructive pulmonary disease, unspecified: Secondary | ICD-10-CM | POA: Diagnosis not present

## 2023-07-05 DIAGNOSIS — I5023 Acute on chronic systolic (congestive) heart failure: Secondary | ICD-10-CM | POA: Diagnosis not present

## 2023-07-05 DIAGNOSIS — Z8679 Personal history of other diseases of the circulatory system: Secondary | ICD-10-CM | POA: Diagnosis not present

## 2023-07-05 DIAGNOSIS — I480 Paroxysmal atrial fibrillation: Secondary | ICD-10-CM | POA: Diagnosis not present

## 2023-07-05 LAB — CBC WITH DIFFERENTIAL/PLATELET
Abs Immature Granulocytes: 0.04 10*3/uL (ref 0.00–0.07)
Basophils Absolute: 0 10*3/uL (ref 0.0–0.1)
Basophils Relative: 0 %
Eosinophils Absolute: 0 10*3/uL (ref 0.0–0.5)
Eosinophils Relative: 0 %
HCT: 33.2 % — ABNORMAL LOW (ref 39.0–52.0)
Hemoglobin: 10.6 g/dL — ABNORMAL LOW (ref 13.0–17.0)
Immature Granulocytes: 1 %
Lymphocytes Relative: 11 %
Lymphs Abs: 1 10*3/uL (ref 0.7–4.0)
MCH: 30 pg (ref 26.0–34.0)
MCHC: 31.9 g/dL (ref 30.0–36.0)
MCV: 94.1 fL (ref 80.0–100.0)
Monocytes Absolute: 0.6 10*3/uL (ref 0.1–1.0)
Monocytes Relative: 7 %
Neutro Abs: 7.1 10*3/uL (ref 1.7–7.7)
Neutrophils Relative %: 81 %
Platelets: 278 10*3/uL (ref 150–400)
RBC: 3.53 MIL/uL — ABNORMAL LOW (ref 4.22–5.81)
RDW: 17 % — ABNORMAL HIGH (ref 11.5–15.5)
WBC: 8.8 10*3/uL (ref 4.0–10.5)
nRBC: 0 % (ref 0.0–0.2)

## 2023-07-05 LAB — BASIC METABOLIC PANEL WITH GFR
Anion gap: 9 (ref 5–15)
BUN: 21 mg/dL (ref 8–23)
CO2: 22 mmol/L (ref 22–32)
Calcium: 8.9 mg/dL (ref 8.9–10.3)
Chloride: 107 mmol/L (ref 98–111)
Creatinine, Ser: 1.19 mg/dL (ref 0.61–1.24)
GFR, Estimated: >60 mL/min (ref >60)
Glucose, Bld: 92 mg/dL (ref 70–99)
Potassium: 3.6 mmol/L (ref 3.5–5.1)
Sodium: 138 mmol/L (ref 135–145)

## 2023-07-05 MED ORDER — POTASSIUM CHLORIDE CRYS ER 20 MEQ PO TBCR: 20.0000 meq | EXTENDED_RELEASE_TABLET | Freq: Every day | ORAL | 0 refills | Status: DC

## 2023-07-05 MED ORDER — POTASSIUM CHLORIDE CRYS ER 20 MEQ PO TBCR: 20.0000 meq | EXTENDED_RELEASE_TABLET | Freq: Every day | ORAL | Status: DC

## 2023-07-05 NOTE — Discharge Summary (Signed)
 Physician Discharge Summary   Patient: Bruce [name] MRN: [id number] DOB: 1945-10-02  Admit date:     07/02/2023  Discharge date: 07/05/23  Discharge Physician: Curlee Doss Brit Carbonell   PCP: Trenda Frisk, FNP   Recommendations at discharge:   No current indication to continue antibiotic therapy at this point, no clinical signs of ongoing infection.  Added Kcl supplements.  Continue supplemental 02 at home per [place] and continue smoking cessation  Follow up renal function and electrolytes in 7 days as outpatient Follow up with Evander Hills FNP in 7 to 10 days Follow up with ID and Cardiology as scheduled.   Discharge Diagnoses: Principal Problem:   Acute on chronic systolic CHF (congestive heart failure) (HCC) Active Problems:   COPD (chronic obstructive pulmonary disease) (HCC)   Paroxysmal atrial fibrillation (HCC)   History of CAD (coronary artery disease)   Hypokalemia   Hypertension   History of prostate cancer   History of pulmonary embolism   Empyema lung (HCC)  Resolved Problems:   * No resolved hospital problems. [facility] Course: Mr. Borg was admitted to the hospital with the working diagnosis of acute on chronic systolic heart failure.   77/M with COPD,  CAD, heart failure, severe RV failure, severe pulmonary hypertension, also reported history of prior lung, prostate and colon CA, DVT/PE, noncompliance. Recent hospitalization 03/20 to 06/06/23, when he was diagnosed with acute on chronic pulmonary embolism. He had thoracentesis on the right, with positive culture for streptococcus pneumoniae. He declined chest tube placement and was discharged on oral antibiotic therapy. He completed Augmentin  by 07/01/23.  Seen in ID clinic for follow-up 4/28 was noted to be dyspneic, and hypoxic, turns out his battery on his home O2 had died and eventually sent to the ED for hypoxia from equipment failure. He reported compliance with meds On his initial physical examination   his blood pressure was 120/77, HR 85, RR 15 and 02 saturation 99% on supplemental 02 per [place].  Lungs with bilateral wheezing with no rhonchi or rales, heart with S1 and S2 present and regular, with no gallops, abdomen with no distention and no lower extremity edema.   Na 142, K 3,4 Cl 108 bicarbonate 22 glucose 102, bun 16 cr 0,97  Lactic acid 2,2, 2,5 4.0 2.7 3.0 and 1,5  Wbc 7.7 hgb 11.7 plt 312  Urine analysis SG 1,017, negative protein, negative hgb and negative leukocytes.   Chest radiograph with right rotation with no cardiomegaly of infiltrates. Small right pleural effusion.  CT chest with no evidence of acute pulmonary embolism.  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  Interval decreased volume of loculated right pleural effusion with less associated pleural enhancement. New small depended left pleural effusion.  Stable small lingular nodule.   EKG 100 bpm, right axis deviation, right bundle branch block, qtc 483, sinus rhythm with no significant ST segment or T wave changes.   Patient was placed on IV furosemide  and empiric antibiotic therapy Infection was ruled out and antibiotic were discontinued 05/01 dyspnea and oxygenation back to baseline, plan for follow up as outpatient.      Assessment and Plan: * Acute on chronic systolic CHF (congestive heart failure) (HCC) Echocardiogram with reduced LV systolic function EF 25 to 30%, global hypokinesis, mild LVH, interventricular septum is flattened in systole and diastole,  RV systolic function with mild reduction, RVSP 37,7, mild to moderate TR, no aortic stenosis.   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  Patient was placed on IV furosemide  for diuresis with improvement in his  volume status.  He was transitioned to loop diuretic with good toleration.   Continue with SGLT 2 inh and bisoprolol .  Continue with sildenafil  for pulmonary hypertension.   Acute on chronic hypoxemic respiratory failure due to cardiogenic pulmonary edema.  Continue supplemental 02 per [place] Currently 02 saturation 92 on 4 L/min per [place]   COPD (chronic obstructive pulmonary disease) (HCC) Continue bronchodilator therapy  No signs of acute exacerbation.   Patient with chronic hypoxemic respiratory failure, on supplemental 02 at home. Smoking cessation counseling   Paroxysmal atrial fibrillation (HCC) Continue bisoprolol  and anticoagulation with apixaban .   History of CAD (coronary artery disease) No chest pain, continue blood pressure control and statin therapy   Hypokalemia At the time of discharge his renal function has a serum cr of 1,19 with K at 3,6 and serum bicarbonate at 22 Na 138  Plan to continue diuresis with furosemide  and SGLT 2 inh.  Added Kcl supplementation  Follow up renal function and electrolytes as outpatient.   Hypertension Continue with bisoprolol  for blood pressure control.   History of prostate cancer Follow up as outpatient.   History of pulmonary embolism Hx of DVT Continue anticoagulation with apixaban    Empyema lung (HCC) Patient has been treated for empyema, with oral antibiotic therapy He completed therapy  Follow up ID as outpatient, he was referred to the ED for hypoxemia.  Interval decreased volume loculated right pleural effusion with less associated pleural enhancement.  Wbc is 8,8, he has been afebrile  Plan to follow up as outpatient, no further antibiotic therapy indicated at this point in time.        Consultants: none  Procedures performed: none   Disposition: Home Diet recommendation:  Cardiac diet DISCHARGE MEDICATION: Allergies as of 07/05/2023       Reactions   Adhesive [tape] Other (See Comments)   After right leg  fracture surgery, pt developed a large blister where tape was applied to his right leg. OK to use paper tape.   Eggs-apples-oats [alitraq] Other (See Comments)   Caused chest pains   Imdur  [isosorbide  Dinitrate] Other (See Comments)   hallucinations   Singulair  [montelukast  Sodium] Other (See Comments)   Hallucinations        Medication List     STOP taking these medications    amoxicillin -clavulanate 875-125 MG tablet Commonly known as: AUGMENTIN    oxymetazoline  0.05 % nasal spray Commonly known as: AFRIN       TAKE these medications    albuterol  108 (90 Base) MCG/ACT inhaler Commonly known as: VENTOLIN  HFA TAKE TWO PUFFS BY MOUTH AS NEEDED FOR WHEEZING EVERY 4-6 HOURS What changed: See the new instructions.   ALPRAZolam  0.5 MG tablet Commonly known as: XANAX  Take 1 tablet (0.5 mg total) by mouth 2 (two) times daily as needed for anxiety.   bisoprolol  5 MG tablet Commonly known as: ZEBETA  Take 0.5 tablets (2.5 mg total) by mouth daily.   budesonide  0.25 MG/2ML nebulizer solution Commonly known as: PULMICORT  Take 2 mLs (0.25 mg total) by nebulization 2 (two) times daily.   cetirizine  10 MG tablet Commonly known as: ZYRTEC  Take 1 tablet (10 mg total) by mouth daily. What changed: when to take this  cholecalciferol  25 MCG (1000 UNIT) tablet Commonly known as: VITAMIN D3 Take 1,000 Units by mouth in the morning.   Eliquis  5 MG Tabs tablet Generic drug: apixaban  TAKE ONE TABLET (5 MG TOTAL) BY MOUTH TWO (TWO) TIMES DAILY. What changed: See the new instructions.   Farxiga  10 MG Tabs tablet Generic drug: dapagliflozin  propanediol TAKE ONE TABLET (10 MG TOTAL) BY MOUTH DAILY. What changed: See the new instructions.   feeding supplement Liqd Take 237 mLs by mouth 2 (two) times daily between meals.   FISH OIL PO Take 1,000 mg by mouth every evening.   fluticasone  50 MCG/ACT nasal spray Commonly known as: FLONASE  Place 1 spray into both nostrils daily as  needed for allergies.   furosemide  20 MG tablet Commonly known as: Lasix  Take 2 tablets (40 mg total) by mouth daily. What changed: when to take this   gabapentin  100 MG capsule Commonly known as: NEURONTIN  TAKE ONE CAPSULE (100 MG TOTAL) BY MOUTH THREE (THREE) TIMES DAILY. What changed: See the new instructions.   lidocaine  5 % Commonly known as: LIDODERM  Place 1 patch onto the skin daily. Remove & Discard patch within 12 hours or as directed by MD   multivitamin tablet Take 1 tablet by mouth in the morning. For Men   omeprazole  40 MG capsule Commonly known as: PRILOSEC Take 1 capsule (40 mg total) by mouth 2 (two) times daily.   OXYGEN  Inhale 3 L/min into the lungs continuous.   potassium chloride  SA 20 MEQ tablet Commonly known as: KLOR-CON  M Take 1 tablet (20 mEq total) by mouth daily.   rOPINIRole  1 MG tablet Commonly known as: REQUIP  TAKE ONE TABLET (1 MG TOTAL) BY MOUTH IN THE MORNING AND AT BEDTIME. What changed: See the new instructions.   rosuvastatin  20 MG tablet Commonly known as: CRESTOR  Take 1 tablet (20 mg total) by mouth daily. What changed: when to take this   sildenafil  20 MG tablet Commonly known as: REVATIO  Take 1 tablet (20 mg total) by mouth 3 (three) times daily.   Stiolto Respimat  2.5-2.5 MCG/ACT Aers Generic drug: Tiotropium Bromide-Olodaterol Inhale 2 puffs into the lungs daily. What changed: when to take this        Follow-up Information     Trenda Frisk, FNP. Schedule an appointment as soon as possible for a visit on 07/11/2023.   Specialty: Family Medicine Why: 11 am for hospital follow up downstairs to see [facility] information: [address] [place] [address] [phone]                Discharge Exam: Filed Weights   07/03/23 1131  Weight: 63.6 kg   BP 104/62 (BP Location: Left Arm)   Pulse 73   Temp 97.6 F (36.4 C) (Oral)   Resp 20   Ht 5\' 9"  (1.753 m)   Wt 63.6 kg   SpO2 91%   BMI 20.71  kg/m   Patient is feeling better, dyspnea has improved and is back to baseline, no chest pain, no orthopnea, lower extremity edema or PND.   Neurology awake and alert ENT with no pallor or icterus Cardiovascular with S1 and S2 present and regular with no gallops, rubs or murmurs Respiratory with prolonged expiratory phase, with scattered rhonchi with no rales or wheezing.  Abdomen with no distention  No lower extremity edema   Condition at discharge: stable  The results of significant diagnostics from this hospitalization (including imaging, microbiology, ancillary and laboratory) are listed below for reference.  Imaging Studies: CT Angio Chest PE W and/or Wo Contrast Result Date: 07/02/2023 CLINICAL DATA:  Pulmonary embolism suspected, high probability. History of sub massive pulmonary embolism. Re-evaluation of prior empyema collection, previously drained. New hypoxia. EXAM: CT ANGIOGRAPHY CHEST WITH CONTRAST TECHNIQUE: Multidetector CT imaging of the chest was performed using the standard protocol during bolus administration of intravenous contrast. Multiplanar CT image reconstructions and MIPs were obtained to evaluate the vascular anatomy. RADIATION DOSE REDUCTION: This exam was performed according to the departmental dose-optimization program which includes automated exposure control, adjustment of the mA and/or kV according to patient size and/or use of iterative reconstruction technique. CONTRAST:  75mL OMNIPAQUE  IOHEXOL  350 MG/ML SOLN COMPARISON:  Radiographs 07/02/2023 and 06/04/2023. Chest CTA 05/24/2023 and 07/24/2022. FINDINGS: Cardiovascular: The pulmonary arteries are well opacified with contrast to the level of the segmental branches. 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 The right upper lobe pulmonary artery remains patent.  There is also mild chronic eccentric nonocclusive thrombus within segmental branches of the left upper and lower lobes. No acute pulmonary emboli are demonstrated. Atherosclerosis of the aorta, great vessels and coronary arteries. No acute systemic arterial abnormalities are identified. The heart size is normal. Small pericardial effusion appears unchanged. Mediastinum/Nodes: There are no enlarged mediastinal, hilar or axillary lymph nodes. The thyroid  gland, trachea and esophagus demonstrate no significant findings. Lungs/Pleura: Previously demonstrated loculated right pleural effusion has decreased in volume and demonstrates less associated pleural enhancement. There is a new small dependent left pleural effusion. No pneumothorax. Interval improved aeration of the right lower lobe. There is chronic central airway thickening with scattered scarring and mild centrilobular emphysema. A 5 mm lingular nodule on image 97/6 is unchanged. No new or enlarging pulmonary nodules. Upper abdomen: No acute findings are seen in the visualized upper abdomen. Musculoskeletal/Chest wall: There is no chest wall mass or suspicious osseous finding. Mild spondylosis. Review of the MIP images confirms the above findings. IMPRESSION: 1. No evidence of acute pulmonary embolism. 2. 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 The appearance of the pulmonary arteries is similar to previous CTA's. 3. Interval decreased volume of loculated right pleural effusion with less associated pleural enhancement. New small dependent left pleural effusion. 4. Stable small lingular nodule. 5. Aortic Atherosclerosis (ICD10-I70.0) and Emphysema (ICD10-J43.9). Electronically Signed   By: Elmon Hagedorn M.D.   On: 07/02/2023 16:26   DG Chest Port 1 View Result Date: 07/02/2023 CLINICAL DATA:   Questionable sepsis - evaluate for abnormality EXAM: PORTABLE CHEST 1 VIEW COMPARISON:  Radiographs 06/04/2023 and 05/31/2023.  CT 05/24/2023. FINDINGS: 1218 hours. Two views submitted. The heart size and mediastinal contours are stable. Previously demonstrated right pleural effusion has nearly completely resolved. Scattered scarring in both lungs appears unchanged. No confluent airspace disease or pneumothorax. The bones appear unchanged. Telemetry leads overlie the chest. IMPRESSION: Near complete resolution of previously demonstrated right pleural effusion. No evidence of acute cardiopulmonary process. Electronically Signed   By: Elmon Hagedorn M.D.   On: 07/02/2023 13:18   DG Ankle Right Port Result Date: 06/06/2023 CLINICAL DATA:  Ankle pain EXAM: PORTABLE RIGHT ANKLE - 2 VIEW COMPARISON:  11/02/2022 FINDINGS: Frontal and lateral views of the right ankle are obtained. No acute fracture, subluxation,  or dislocation. Ankle mortise is intact. Joint spaces are well preserved. Soft tissues are unremarkable. IMPRESSION: 1. Unremarkable right ankle. Electronically Signed   By: Bobbye Burrow M.D.   On: 06/06/2023 15:35    Microbiology: Results for orders placed or performed during the hospital encounter of 07/02/23  Urine Culture (for pregnant, neutropenic or urologic patients or patients with an indwelling urinary catheter)     Status: Abnormal   Collection Time: 07/02/23  1:06 AM   Specimen: Urine, Clean Catch  Result Value Ref Range Status   Specimen Description URINE, CLEAN CATCH  Final   Special Requests   Final    Normal Performed at [facility], [address], [place], [place] [address]    Culture MULTIPLE SPECIES PRESENT, SUGGEST RECOLLECTION (A)  Final   Report Status 07/04/2023 FINAL  Final  Blood Culture (routine x 2)     Status: None (Preliminary result)   Collection Time: 07/02/23 11:50 AM   Specimen: BLOOD  Result Value Ref Range Status   Specimen Description BLOOD RIGHT  ANTECUBITAL  Final   Special Requests   Final    BOTTLES DRAWN AEROBIC AND ANAEROBIC Blood Culture adequate volume   Culture   Final    NO GROWTH 3 DAYS Performed at [facility], [address] [place], [place] [address]    Report Status PENDING  Incomplete  Blood Culture (routine x 2)     Status: None (Preliminary result)   Collection Time: 07/02/23 12:04 PM   Specimen: BLOOD  Result Value Ref Range Status   Specimen Description BLOOD LEFT ANTECUBITAL  Final   Special Requests   Final    BOTTLES DRAWN AEROBIC AND ANAEROBIC Blood Culture adequate volume   Culture   Final    NO GROWTH 3 DAYS Performed at [facility], [address], [place], [place] [address]    Report Status PENDING  Incomplete  Respiratory (~20 pathogens) panel by PCR     Status: None   Collection Time: 07/02/23  7:38 PM   Specimen: Nasopharyngeal Swab; Respiratory  Result Value Ref Range Status   Adenovirus NOT DETECTED NOT DETECTED Final   Coronavirus 229E NOT DETECTED NOT DETECTED Final    Comment: (NOTE) The Coronavirus on the Respiratory Panel, DOES NOT test for the novel  Coronavirus (2019 nCoV)    Coronavirus HKU1 NOT DETECTED NOT DETECTED Final   Coronavirus NL63 NOT DETECTED NOT DETECTED Final   Coronavirus OC43 NOT DETECTED NOT DETECTED Final   Metapneumovirus NOT DETECTED NOT DETECTED Final   Rhinovirus / Enterovirus NOT DETECTED NOT DETECTED Final   Influenza A NOT DETECTED NOT DETECTED Final   Influenza B NOT DETECTED NOT DETECTED Final   Parainfluenza Virus 1 NOT DETECTED NOT DETECTED Final   Parainfluenza Virus 2 NOT DETECTED NOT DETECTED Final   Parainfluenza Virus 3 NOT DETECTED NOT DETECTED Final   Parainfluenza Virus 4 NOT DETECTED NOT DETECTED Final   Respiratory Syncytial Virus NOT DETECTED NOT DETECTED Final   Bordetella pertussis NOT DETECTED NOT DETECTED Final   Bordetella Parapertussis NOT DETECTED NOT DETECTED Final   Chlamydophila pneumoniae NOT DETECTED NOT  DETECTED Final   Mycoplasma pneumoniae NOT DETECTED NOT DETECTED Final    Comment: Performed at [facility], [address], [place], [place] [address]    Labs: CBC: Recent Labs  Lab 07/02/23 1204 07/03/23 0513 07/04/23 0242 07/05/23 0250  WBC 7.7 6.3 11.2* 8.8  NEUTROABS 5.8  --   --  7.1  HGB 11.7* 9.8* 10.2* 10.6*  HCT 37.3* 30.6* 32.5* 33.2*  MCV 95.4 93.6 94.5 94.1  PLT 312 231 265 278   Basic Metabolic Panel: Recent Labs  Lab 07/02/23 1204 07/03/23 0513 07/04/23 0242 07/05/23 0250  NA 142 138 136 138  K 3.4* 3.6 3.9 3.6  CL 108 108 109 107  CO2 22 21* 20* 22  GLUCOSE 102* 165* 133* 92  BUN 16 13 18 21   CREATININE 0.97 0.80 1.01 1.19  CALCIUM  9.2 8.3* 8.9 8.9   Liver Function Tests: Recent Labs  Lab 07/02/23 1204 07/03/23 0513 07/04/23 0242  AST 24 19 21   ALT 23 19 21   ALKPHOS 102 79 81  BILITOT 0.9 0.7 0.8  PROT 6.6 6.1* 6.0*  ALBUMIN  2.7* 2.7* 2.6*   CBG: No results for input(s): "GLUCAP" in the last 168 hours.  Discharge time spent: greater than 30 minutes.  Signed: Albertus Alt, MD [facility] 07/05/2023

## 2023-07-05 NOTE — Telephone Encounter (Signed)
 Bruce [name] contacted me and reported that he was sent home from the hospital today. We reviewed his discharge instructions and medication list- he still has medications in his pill box and we planned to have a home visit Monday morning.   I called [facility] to review his updated med list and confirm same.    I made Bruce [name] aware of importance of med compliance, wearing oxygen , keeping his portable charged. He agreed. I also advised him of his follow up with PCP on Wednesday next week.   Call complete.   Bruce [name], EMT-Paramedic [phone] 07/05/2023

## 2023-07-05 NOTE — Progress Notes (Signed)
 Mobility Specialist Progress Note:   07/05/23 1045  Mobility  Activity Stood at bedside  Level of Assistance Contact guard assist, steadying assist  Assistive Device Other (Comment) (HHA)  Distance Ambulated (ft)  (unable d/t desat)  Activity Response Tolerated fair  Mobility Referral Yes  Mobility visit 1 Mobility  Mobility Specialist Start Time (ACUTE ONLY) 1045  Mobility Specialist Stop Time (ACUTE ONLY) 1056  Mobility Specialist Time Calculation (min) (ACUTE ONLY) 11 min   Pt agreeable to mobility session prior to d/c. Required HHA to stand from EOB. Once standing, pt became visibly SOB, Spo2 80% on 5LO2. Recovered with seated rest and limiting talking. Further mobility deferred. Pt left sitting EOB with all needs met.   Oneda Big Mobility Specialist Please contact via SecureChat or  Rehab office at [phone]

## 2023-07-05 NOTE — TOC Transition Note (Signed)
 Transition of Care [facility]) - Discharge Note   Patient Details  Name: Bruce [name] MRN: [id number] Date of Birth: 1945-05-27  Transition of [facility]) CM/SW Contact:  Jennett Model, RN Phone Number: 07/05/2023, 11:50 AM   Clinical Narrative:     For dc today, he has no needs.         Patient Goals and CMS Choice            Discharge Placement                       Discharge Plan and Services Additional resources added to the After Visit Summary for                                       Social Drivers of Health (SDOH) Interventions SDOH Screenings   Food Insecurity: Food Insecurity Present (07/03/2023)  Housing: High Risk (07/03/2023)  Transportation Needs: No Transportation Needs (07/03/2023)  Recent Concern: Transportation Needs - Unmet Transportation Needs (05/25/2023)  Utilities: Not At Risk (07/03/2023)  Depression (PHQ2-9): Medium Risk (06/26/2023)  Financial Resource Strain: High Risk (04/12/2022)  Physical Activity: Inactive (02/05/2017)  Social Connections: Unknown (07/03/2023)  Stress: Stress Concern Present (02/05/2017)  Tobacco Use: Medium Risk (07/02/2023)     Readmission Risk Interventions    05/28/2023    5:06 PM  Readmission Risk Prevention Plan  Transportation Screening Complete  PCP or Specialist Appt within 5-7 Days Complete  Home Care Screening Complete  Medication Review (RN CM) Complete

## 2023-07-05 NOTE — Plan of Care (Signed)
  Problem: Activity: Goal: Ability to tolerate increased activity will improve Outcome: Progressing Goal: Will verbalize the importance of balancing activity with adequate rest periods Outcome: Progressing   Problem: Education: Goal: Knowledge of General Education information will improve Description: Including pain rating scale, medication(s)/side effects and non-pharmacologic comfort measures Outcome: Progressing   Problem: Health Behavior/Discharge Planning: Goal: Ability to manage health-related needs will improve Outcome: Progressing

## 2023-07-06 ENCOUNTER — Telehealth: Payer: Self-pay

## 2023-07-06 NOTE — Transitions of Care (Post Inpatient/ED Visit) (Signed)
   07/06/2023  Name: JAYMEE WOOTERS MRN: [id number] DOB: 08-21-1945  Today's TOC FU Call Status: Today's TOC FU Call Status:: Unsuccessful Call (1st Attempt) Unsuccessful Call (1st Attempt) Date: 07/06/23  Attempted to reach the patient regarding the most recent Inpatient/ED visit.  Follow Up Plan: Additional outreach attempts will be made to reach the patient to complete the Transitions of Care (Post Inpatient/ED visit) call.    Gareld June, BSN, RN [place]  [facility] - [employer] [facility] RN Care Manager [phone]

## 2023-07-07 LAB — CULTURE, BLOOD (ROUTINE X 2)
Culture: NO GROWTH
Culture: NO GROWTH
Special Requests: ADEQUATE
Special Requests: ADEQUATE

## 2023-07-08 DIAGNOSIS — J9620 Acute and chronic respiratory failure, unspecified whether with hypoxia or hypercapnia: Secondary | ICD-10-CM | POA: Diagnosis not present

## 2023-07-08 DIAGNOSIS — R0609 Other forms of dyspnea: Secondary | ICD-10-CM | POA: Diagnosis not present

## 2023-07-08 DIAGNOSIS — J449 Chronic obstructive pulmonary disease, unspecified: Secondary | ICD-10-CM | POA: Diagnosis not present

## 2023-07-08 DIAGNOSIS — J479 Bronchiectasis, uncomplicated: Secondary | ICD-10-CM | POA: Diagnosis not present

## 2023-07-09 ENCOUNTER — Other Ambulatory Visit (HOSPITAL_COMMUNITY): Payer: Self-pay

## 2023-07-09 ENCOUNTER — Telehealth: Payer: Self-pay
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
## 2023-07-10 ENCOUNTER — Encounter: Payer: Self-pay | Admitting: *Deleted

## 2023-07-10 NOTE — Telephone Encounter (Signed)
 This encounter was created in error - please disregard.

## 2023-07-11 ENCOUNTER — Ambulatory Visit (INDEPENDENT_AMBULATORY_CARE_PROVIDER_SITE_OTHER): Admitting: Family

## 2023-07-11 ENCOUNTER — Encounter: Payer: Self-pay | Admitting: Family

## 2023-07-11 VITALS — BP 142/82 | HR 96 | Ht 73.0 in | Wt 150.6 lb

## 2023-07-11 DIAGNOSIS — F331 Major depressive disorder, recurrent, moderate: Secondary | ICD-10-CM | POA: Diagnosis not present

## 2023-07-11 DIAGNOSIS — I5043 Acute on chronic combined systolic (congestive) and diastolic (congestive) heart failure: Secondary | ICD-10-CM | POA: Diagnosis not present

## 2023-07-11 DIAGNOSIS — J441 Chronic obstructive pulmonary disease with (acute) exacerbation: Secondary | ICD-10-CM

## 2023-07-11 DIAGNOSIS — I10 Essential (primary) hypertension: Secondary | ICD-10-CM

## 2023-07-16 ENCOUNTER — Other Ambulatory Visit (HOSPITAL_COMMUNITY): Payer: Self-pay

## 2023-07-16 ENCOUNTER — Inpatient Hospital Stay (HOSPITAL_COMMUNITY)
Admission: EM | Admit: 2023-07-16 | Discharge: 2023-07-18 | DRG: 189 | Disposition: A | Discharge status: Home / Self Care | Attending: Internal Medicine | Admitting: Internal Medicine

## 2023-07-16 ENCOUNTER — Emergency Department (HOSPITAL_COMMUNITY)

## 2023-07-16 ENCOUNTER — Encounter (HOSPITAL_COMMUNITY): Payer: Self-pay

## 2023-07-16 ENCOUNTER — Telehealth (HOSPITAL_COMMUNITY): Payer: Self-pay

## 2023-07-16 ENCOUNTER — Other Ambulatory Visit: Payer: Self-pay

## 2023-07-16 DIAGNOSIS — F411 Generalized anxiety disorder: Secondary | ICD-10-CM | POA: Diagnosis not present

## 2023-07-16 DIAGNOSIS — Z86711 Personal history of pulmonary embolism: Secondary | ICD-10-CM

## 2023-07-16 DIAGNOSIS — I272 Pulmonary hypertension, unspecified: Secondary | ICD-10-CM | POA: Diagnosis present

## 2023-07-16 DIAGNOSIS — R609 Edema, unspecified: Secondary | ICD-10-CM | POA: Diagnosis not present

## 2023-07-16 DIAGNOSIS — I739 Peripheral vascular disease, unspecified: Secondary | ICD-10-CM | POA: Diagnosis present

## 2023-07-16 DIAGNOSIS — Z87440 Personal history of urinary (tract) infections: Secondary | ICD-10-CM

## 2023-07-16 DIAGNOSIS — R0902 Hypoxemia: Secondary | ICD-10-CM | POA: Diagnosis not present

## 2023-07-16 DIAGNOSIS — J9621 Acute and chronic respiratory failure with hypoxia: Principal | ICD-10-CM | POA: Diagnosis present

## 2023-07-16 DIAGNOSIS — Z8249 Family history of ischemic heart disease and other diseases of the circulatory system: Secondary | ICD-10-CM

## 2023-07-16 DIAGNOSIS — R0602 Shortness of breath: Secondary | ICD-10-CM | POA: Diagnosis present

## 2023-07-16 DIAGNOSIS — I5020 Unspecified systolic (congestive) heart failure: Secondary | ICD-10-CM | POA: Diagnosis not present

## 2023-07-16 DIAGNOSIS — Z8546 Personal history of malignant neoplasm of prostate: Secondary | ICD-10-CM | POA: Diagnosis not present

## 2023-07-16 DIAGNOSIS — K219 Gastro-esophageal reflux disease without esophagitis: Secondary | ICD-10-CM | POA: Diagnosis not present

## 2023-07-16 DIAGNOSIS — Z85038 Personal history of other malignant neoplasm of large intestine: Secondary | ICD-10-CM | POA: Diagnosis not present

## 2023-07-16 DIAGNOSIS — J9601 Acute respiratory failure with hypoxia: Principal | ICD-10-CM

## 2023-07-16 DIAGNOSIS — Z7951 Long term (current) use of inhaled steroids: Secondary | ICD-10-CM

## 2023-07-16 DIAGNOSIS — E785 Hyperlipidemia, unspecified: Secondary | ICD-10-CM | POA: Diagnosis present

## 2023-07-16 DIAGNOSIS — Z515 Encounter for palliative care: Secondary | ICD-10-CM | POA: Diagnosis not present

## 2023-07-16 DIAGNOSIS — R5383 Other fatigue: Secondary | ICD-10-CM | POA: Diagnosis not present

## 2023-07-16 DIAGNOSIS — Z923 Personal history of irradiation: Secondary | ICD-10-CM | POA: Diagnosis not present

## 2023-07-16 DIAGNOSIS — I252 Old myocardial infarction: Secondary | ICD-10-CM

## 2023-07-16 DIAGNOSIS — J449 Chronic obstructive pulmonary disease, unspecified: Secondary | ICD-10-CM | POA: Diagnosis not present

## 2023-07-16 DIAGNOSIS — F419 Anxiety disorder, unspecified: Secondary | ICD-10-CM | POA: Diagnosis present

## 2023-07-16 DIAGNOSIS — R54 Age-related physical debility: Secondary | ICD-10-CM | POA: Diagnosis present

## 2023-07-16 DIAGNOSIS — I10 Essential (primary) hypertension: Secondary | ICD-10-CM | POA: Diagnosis present

## 2023-07-16 DIAGNOSIS — J441 Chronic obstructive pulmonary disease with (acute) exacerbation: Secondary | ICD-10-CM | POA: Diagnosis present

## 2023-07-16 DIAGNOSIS — Z66 Do not resuscitate: Secondary | ICD-10-CM | POA: Diagnosis not present

## 2023-07-16 DIAGNOSIS — I110 Hypertensive heart disease with heart failure: Secondary | ICD-10-CM | POA: Diagnosis not present

## 2023-07-16 DIAGNOSIS — R531 Weakness: Secondary | ICD-10-CM | POA: Diagnosis not present

## 2023-07-16 DIAGNOSIS — I5022 Chronic systolic (congestive) heart failure: Secondary | ICD-10-CM | POA: Diagnosis present

## 2023-07-16 DIAGNOSIS — Z79899 Other long term (current) drug therapy: Secondary | ICD-10-CM | POA: Diagnosis not present

## 2023-07-16 DIAGNOSIS — Z7901 Long term (current) use of anticoagulants: Secondary | ICD-10-CM

## 2023-07-16 DIAGNOSIS — Z7189 Other specified counseling: Secondary | ICD-10-CM | POA: Diagnosis not present

## 2023-07-16 DIAGNOSIS — I480 Paroxysmal atrial fibrillation: Secondary | ICD-10-CM | POA: Diagnosis present

## 2023-07-16 DIAGNOSIS — I2510 Atherosclerotic heart disease of native coronary artery without angina pectoris: Secondary | ICD-10-CM | POA: Diagnosis not present

## 2023-07-16 DIAGNOSIS — J984 Other disorders of lung: Secondary | ICD-10-CM | POA: Diagnosis not present

## 2023-07-16 LAB — CBC WITH DIFFERENTIAL/PLATELET
Abs Immature Granulocytes: 0.03 10*3/uL (ref 0.00–0.07)
Basophils Absolute: 0 10*3/uL (ref 0.0–0.1)
Basophils Relative: 0 %
Eosinophils Absolute: 0.1 10*3/uL (ref 0.0–0.5)
Eosinophils Relative: 1 %
HCT: 37.8 % — ABNORMAL LOW (ref 39.0–52.0)
Hemoglobin: 11.8 g/dL — ABNORMAL LOW (ref 13.0–17.0)
Immature Granulocytes: 0 %
Lymphocytes Relative: 13 %
Lymphs Abs: 1 10*3/uL (ref 0.7–4.0)
MCH: 29.9 pg (ref 26.0–34.0)
MCHC: 31.2 g/dL (ref 30.0–36.0)
MCV: 95.9 fL (ref 80.0–100.0)
Monocytes Absolute: 0.5 10*3/uL (ref 0.1–1.0)
Monocytes Relative: 6 %
Neutro Abs: 6 10*3/uL (ref 1.7–7.7)
Neutrophils Relative %: 80 %
Platelets: 213 10*3/uL (ref 150–400)
RBC: 3.94 MIL/uL — ABNORMAL LOW (ref 4.22–5.81)
RDW: 17.9 % — ABNORMAL HIGH (ref 11.5–15.5)
WBC: 7.5 10*3/uL (ref 4.0–10.5)
nRBC: 0 % (ref 0.0–0.2)

## 2023-07-16 LAB — BRAIN NATRIURETIC PEPTIDE: B Natriuretic Peptide: 1450.8 pg/mL — ABNORMAL HIGH (ref 0.0–100.0)

## 2023-07-16 LAB — BASIC METABOLIC PANEL WITH GFR
Anion gap: 12 (ref 5–15)
BUN: 16 mg/dL (ref 8–23)
CO2: 21 mmol/L — ABNORMAL LOW (ref 22–32)
Calcium: 9.3 mg/dL (ref 8.9–10.3)
Chloride: 105 mmol/L (ref 98–111)
Creatinine, Ser: 0.99 mg/dL (ref 0.61–1.24)
GFR, Estimated: >60 mL/min (ref >60)
Glucose, Bld: 104 mg/dL — ABNORMAL HIGH (ref 70–99)
Potassium: 4.2 mmol/L (ref 3.5–5.1)
Sodium: 138 mmol/L (ref 135–145)

## 2023-07-16 LAB — TROPONIN I (HIGH SENSITIVITY)
Troponin I (High Sensitivity): 39 ng/L — ABNORMAL HIGH (ref <18)
Troponin I (High Sensitivity): 40 ng/L — ABNORMAL HIGH

## 2023-07-16 LAB — SARS CORONAVIRUS 2 BY RT PCR: SARS Coronavirus 2 by RT PCR: NEGATIVE

## 2023-07-16 MED ORDER — LORATADINE 10 MG PO TABS
10.0000 mg | ORAL_TABLET | Freq: Every day | ORAL | Status: DC
  Administered 2023-07-17 – 2023-07-18 (×2): 10 mg via ORAL
  Filled 2023-07-16 (×2): qty 1

## 2023-07-16 MED ORDER — METHYLPREDNISOLONE SODIUM SUCC 125 MG IJ SOLR
125.0000 mg | Freq: Once | INTRAMUSCULAR | Status: AC
  Administered 2023-07-16: 125 mg via INTRAVENOUS
  Filled 2023-07-16: qty 2

## 2023-07-16 MED ORDER — GUAIFENESIN ER 600 MG PO TB12
600.0000 mg | ORAL_TABLET | Freq: Two times a day (BID) | ORAL | Status: DC
  Administered 2023-07-16 – 2023-07-18 (×4): 600 mg via ORAL
  Filled 2023-07-16 (×4): qty 1

## 2023-07-16 MED ORDER — IPRATROPIUM-ALBUTEROL 0.5-2.5 (3) MG/3ML IN SOLN
3.0000 mL | Freq: Four times a day (QID) | RESPIRATORY_TRACT | Status: DC
  Administered 2023-07-16 – 2023-07-17 (×4): 3 mL via RESPIRATORY_TRACT
  Filled 2023-07-16 (×4): qty 3

## 2023-07-16 MED ORDER — BUDESONIDE 0.25 MG/2ML IN SUSP
0.2500 mg | Freq: Two times a day (BID) | RESPIRATORY_TRACT | Status: DC
  Administered 2023-07-16 – 2023-07-18 (×4): 0.25 mg via RESPIRATORY_TRACT
  Filled 2023-07-16 (×4): qty 2

## 2023-07-16 MED ORDER — APIXABAN 5 MG PO TABS
5.0000 mg | ORAL_TABLET | Freq: Two times a day (BID) | ORAL | Status: DC
  Administered 2023-07-16 – 2023-07-18 (×4): 5 mg via ORAL
  Filled 2023-07-16 (×4): qty 1

## 2023-07-16 MED ORDER — ACETAMINOPHEN 325 MG PO TABS: 650.0000 mg | ORAL_TABLET | Freq: Four times a day (QID) | ORAL | Status: DC | PRN

## 2023-07-16 MED ORDER — MAGNESIUM SULFATE 2 GM/50ML IV SOLN
2.0000 g | Freq: Once | INTRAVENOUS | Status: AC
  Administered 2023-07-16: 2 g via INTRAVENOUS
  Filled 2023-07-16: qty 50

## 2023-07-16 MED ORDER — ROPINIROLE HCL 0.5 MG PO TABS
1.0000 mg | ORAL_TABLET | Freq: Two times a day (BID) | ORAL | Status: DC
  Administered 2023-07-16 – 2023-07-18 (×4): 1 mg via ORAL
  Filled 2023-07-16 (×4): qty 2

## 2023-07-16 MED ORDER — DAPAGLIFLOZIN PROPANEDIOL 10 MG PO TABS
10.0000 mg | ORAL_TABLET | Freq: Every morning | ORAL | Status: DC
  Administered 2023-07-17 – 2023-07-18 (×2): 10 mg via ORAL
  Filled 2023-07-16 (×2): qty 1

## 2023-07-16 MED ORDER — BISOPROLOL FUMARATE 5 MG PO TABS
2.5000 mg | ORAL_TABLET | Freq: Every day | ORAL | Status: DC
  Administered 2023-07-17 – 2023-07-18 (×2): 2.5 mg via ORAL
  Filled 2023-07-16 (×2): qty 1

## 2023-07-16 MED ORDER — FUROSEMIDE 10 MG/ML IJ SOLN
40.0000 mg | Freq: Once | INTRAMUSCULAR | Status: AC
  Administered 2023-07-16: 40 mg via INTRAVENOUS
  Filled 2023-07-16: qty 4

## 2023-07-16 MED ORDER — ACETAMINOPHEN 650 MG RE SUPP: 650.0000 mg | Freq: Four times a day (QID) | RECTAL | Status: DC | PRN

## 2023-07-16 MED ORDER — PREDNISONE 20 MG PO TABS
40.0000 mg | ORAL_TABLET | Freq: Every day | ORAL | Status: DC
  Administered 2023-07-17 – 2023-07-18 (×2): 40 mg via ORAL
  Filled 2023-07-16 (×2): qty 2

## 2023-07-16 MED ORDER — ALPRAZOLAM 0.5 MG PO TABS: 0.5000 mg | ORAL_TABLET | Freq: Two times a day (BID) | ORAL | Status: DC | PRN

## 2023-07-16 MED ORDER — FUROSEMIDE 40 MG PO TABS
40.0000 mg | ORAL_TABLET | Freq: Every morning | ORAL | Status: DC
  Administered 2023-07-17 – 2023-07-18 (×2): 40 mg via ORAL
  Filled 2023-07-16 (×2): qty 1

## 2023-07-16 MED ORDER — METHYLPREDNISOLONE SODIUM SUCC 40 MG IJ SOLR
40.0000 mg | Freq: Two times a day (BID) | INTRAMUSCULAR | Status: AC
  Administered 2023-07-16 – 2023-07-17 (×2): 40 mg via INTRAVENOUS
  Filled 2023-07-16 (×2): qty 1

## 2023-07-16 MED ORDER — SILDENAFIL CITRATE 20 MG PO TABS
20.0000 mg | ORAL_TABLET | Freq: Three times a day (TID) | ORAL | Status: DC
  Administered 2023-07-16 – 2023-07-18 (×5): 20 mg via ORAL
  Filled 2023-07-16 (×7): qty 1

## 2023-07-16 MED ORDER — VITAMIN D 25 MCG (1000 UNIT) PO TABS
1000.0000 [IU] | ORAL_TABLET | Freq: Every morning | ORAL | Status: DC
  Administered 2023-07-17 – 2023-07-18 (×2): 1000 [IU] via ORAL
  Filled 2023-07-16 (×2): qty 1

## 2023-07-16 MED ORDER — POTASSIUM CHLORIDE CRYS ER 20 MEQ PO TBCR
20.0000 meq | EXTENDED_RELEASE_TABLET | Freq: Every day | ORAL | Status: DC
  Administered 2023-07-17 – 2023-07-18 (×2): 20 meq via ORAL
  Filled 2023-07-16 (×2): qty 1

## 2023-07-16 MED ORDER — OXYCODONE HCL 5 MG PO TABS
5.0000 mg | ORAL_TABLET | ORAL | Status: DC | PRN
  Administered 2023-07-16 – 2023-07-18 (×2): 5 mg via ORAL
  Filled 2023-07-16 (×2): qty 1

## 2023-07-16 MED ORDER — PREDNISONE 20 MG PO TABS: 60.0000 mg | ORAL_TABLET | Freq: Every day | ORAL | 0 refills | Status: DC

## 2023-07-16 MED ORDER — PANTOPRAZOLE SODIUM 40 MG PO TBEC
40.0000 mg | DELAYED_RELEASE_TABLET | Freq: Every day | ORAL | Status: DC
  Administered 2023-07-17 – 2023-07-18 (×2): 40 mg via ORAL
  Filled 2023-07-16 (×2): qty 1

## 2023-07-16 MED ORDER — ALBUTEROL SULFATE (2.5 MG/3ML) 0.083% IN NEBU
2.5000 mg | INHALATION_SOLUTION | RESPIRATORY_TRACT | Status: DC | PRN
  Administered 2023-07-18: 2.5 mg via RESPIRATORY_TRACT
  Filled 2023-07-16: qty 3

## 2023-07-16 MED ORDER — IPRATROPIUM-ALBUTEROL 0.5-2.5 (3) MG/3ML IN SOLN
3.0000 mL | Freq: Once | RESPIRATORY_TRACT | Status: AC
  Administered 2023-07-16: 3 mL via RESPIRATORY_TRACT
  Filled 2023-07-16: qty 3

## 2023-07-16 MED ORDER — ROSUVASTATIN CALCIUM 20 MG PO TABS
20.0000 mg | ORAL_TABLET | Freq: Every day | ORAL | Status: DC
  Administered 2023-07-16 – 2023-07-18 (×3): 20 mg via ORAL
  Filled 2023-07-16 (×3): qty 1

## 2023-07-16 MED ORDER — FLUTICASONE PROPIONATE 50 MCG/ACT NA SUSP
1.0000 | Freq: Every day | NASAL | Status: DC | PRN
  Administered 2023-07-17: 1 via NASAL
  Filled 2023-07-16: qty 16

## 2023-07-16 MED ORDER — ADULT MULTIVITAMIN W/MINERALS CH
1.0000 | ORAL_TABLET | Freq: Every morning | ORAL | Status: DC
  Administered 2023-07-17 – 2023-07-18 (×2): 1 via ORAL
  Filled 2023-07-16 (×2): qty 1

## 2023-07-16 MED ORDER — GABAPENTIN 100 MG PO CAPS
100.0000 mg | ORAL_CAPSULE | Freq: Three times a day (TID) | ORAL | Status: DC
  Administered 2023-07-16 – 2023-07-18 (×5): 100 mg via ORAL
  Filled 2023-07-16 (×5): qty 1

## 2023-07-16 NOTE — ED Notes (Signed)
 Pt is stable at 100% on 5L of O2, decreased O2 to 4L.

## 2023-07-16 NOTE — ED Notes (Addendum)
 NT was about to ambulate pt but his O2 was at 84% on 4L. He was increased to 6L, he is now at 91% on [place]. MD notified, at beside.

## 2023-07-16 NOTE — ED Notes (Signed)
 XR at bedside

## 2023-07-16 NOTE — ED Notes (Signed)
 Admitting MD at bedside.

## 2023-07-16 NOTE — ED Notes (Signed)
 Roommate Tiburcio Fly [phone] would like an update asap and talk to the patient immediately

## 2023-07-16 NOTE — ED Notes (Signed)
 Patient placement called regarding patient bed status. MD to place progressive orders.

## 2023-07-16 NOTE — Discharge Instructions (Addendum)
 You are seen in the emergency department for increased shortness of breath.  Your heart failure number was slightly elevated.  You were given some extra Lasix  along with steroids and breathing treatments.  Please do steroids for 4 more days.  Continue oxygen  and breathing treatments.  Follow-up with your regular doctor.  Return to the emergency department if any worsening or concerning symptoms

## 2023-07-16 NOTE — ED Provider Notes (Signed)
 [facility] EMERGENCY DEPARTMENT AT [facility] Provider Note   CSN: [id number] Arrival date & time: 07/16/23  1042     History  Chief Complaint  Patient presents with   Shortness of Breath    Bruce [name] is a 78 y.o. male.  He has a history of COPD CHF PE A-fib on 3-1/2 L of oxygen  and on Eliquis .  He said he gets short of breath when he overexerts himself.  Overexerted himself again this morning.  Increased his home oxygen .  When his visiting nurse came to see him she felt he was breathing too hard and wanted him to come to the emergency department.  He denies any fevers or chest pain.  He does endorse some chronic right leg pain.  He also said he has had a cough productive of some brownish sputum.  Or more smoker  The history is provided by the patient.  Shortness of Breath Severity:  Moderate Onset quality:  Gradual Duration:  2 hours Timing:  Constant Progression:  Unchanged Chronicity:  Recurrent Context: activity   Relieved by:  Nothing Worsened by:  Activity Ineffective treatments:  Oxygen  and rest Associated symptoms: cough and sputum production   Associated symptoms: no abdominal pain, no chest pain, no fever and no hemoptysis   Risk factors: no tobacco use        Home Medications Prior to Admission medications   Medication Sig Start Date End Date Taking? Authorizing Provider  albuterol  (VENTOLIN  HFA) 108 (90 Base) MCG/ACT inhaler TAKE TWO PUFFS BY MOUTH AS NEEDED FOR WHEEZING EVERY 4-6 HOURS Patient taking differently: Inhale 2 puffs into the lungs every 4 (four) hours as needed for wheezing or shortness of breath. 11/10/22   Cherrie Cornwall, MD  ALPRAZolam  (XANAX ) 0.5 MG tablet Take 1 tablet (0.5 mg total) by mouth 2 (two) times daily as needed for anxiety. 05/02/23   Trenda Frisk, FNP  apixaban  (ELIQUIS ) 5 MG TABS tablet TAKE ONE TABLET (5 MG TOTAL) BY MOUTH TWO (TWO) TIMES DAILY. Patient taking differently: Take 5 mg by mouth 2 (two) times daily.  09/26/22   Bensimhon, Rheta Celestine, MD  bisoprolol  (ZEBETA ) 5 MG tablet Take 0.5 tablets (2.5 mg total) by mouth daily. 06/14/23   Sheryl Donna, NP  budesonide  (PULMICORT ) 0.25 MG/2ML nebulizer solution Take 2 mLs (0.25 mg total) by nebulization 2 (two) times daily. 04/24/23   Malen Scudder, DO  cetirizine  (ZYRTEC ) 10 MG tablet Take 1 tablet (10 mg total) by mouth daily. Patient taking differently: Take 10 mg by mouth in the morning. 05/12/22   Glendale Landmark, NP  cholecalciferol  (VITAMIN D3) 25 MCG (1000 UNIT) tablet Take 1,000 Units by mouth in the morning.    [provider]  FARXIGA  10 MG TABS tablet TAKE ONE TABLET (10 MG TOTAL) BY MOUTH DAILY. Patient taking differently: Take 10 mg by mouth in the morning. 12/15/22   Milford, Arlice Bene, FNP  feeding supplement (ENSURE ENLIVE / ENSURE PLUS) LIQD Take 237 mLs by mouth 2 (two) times daily between meals. 06/06/23   Kraig Peru, MD  fluticasone  (FLONASE ) 50 MCG/ACT nasal spray Place 1 spray into both nostrils daily as needed for allergies. 03/26/23   [provider]  furosemide  (LASIX ) 20 MG tablet Take 2 tablets (40 mg total) by mouth daily. Patient taking differently: Take 40 mg by mouth in the morning. 05/23/23 05/22/24  Elmarie Hacking, FNP  gabapentin  (NEURONTIN ) 100 MG capsule TAKE ONE CAPSULE (100 MG TOTAL)  BY MOUTH THREE (THREE) TIMES DAILY. Patient taking differently: Take 100 mg by mouth 3 (three) times daily. 06/28/23   Trenda Frisk, FNP  lidocaine  (LIDODERM ) 5 % Place 1 patch onto the skin daily. Remove & Discard patch within 12 hours or as directed by MD 05/12/22   Boswell, Chelsa, NP  Multiple Vitamin (MULTIVITAMIN) tablet Take 1 tablet by mouth in the morning. For Men    [provider]  Omega-3 Fatty Acids (FISH OIL PO) Take 1,000 mg by mouth every evening.    [provider]  omeprazole  (PRILOSEC) 40 MG capsule Take 1 capsule (40 mg total) by mouth 2 (two) times daily. 05/02/23   Trenda Frisk, FNP   OXYGEN  Inhale 3 L/min into the lungs continuous.    [provider]  potassium chloride  SA (KLOR-CON  M) 20 MEQ tablet Take 1 tablet (20 mEq total) by mouth daily. 07/05/23 08/04/23  Arrien, Curlee Doss, MD  rOPINIRole  (REQUIP ) 1 MG tablet TAKE ONE TABLET (1 MG TOTAL) BY MOUTH IN THE MORNING AND AT BEDTIME. Patient taking differently: Take 1 mg by mouth. 05/23/23   Trenda Frisk, FNP  rosuvastatin  (CRESTOR ) 20 MG tablet Take 1 tablet (20 mg total) by mouth daily. Patient taking differently: Take 20 mg by mouth every evening. 04/24/23   Malen Scudder, DO  sildenafil  (REVATIO ) 20 MG tablet Take 1 tablet (20 mg total) by mouth 3 (three) times daily. 01/15/23   Milford, Arlice Bene, FNP  Tiotropium Bromide-Olodaterol (STIOLTO RESPIMAT ) 2.5-2.5 MCG/ACT AERS Inhale 2 puffs into the lungs daily. Patient taking differently: Inhale 2 puffs into the lungs in the morning. 04/24/23 04/23/24  Malen Scudder, DO      Allergies    Adhesive [tape], Eggs-apples-oats [alitraq], Imdur  [isosorbide  dinitrate], and Singulair  [montelukast  sodium]    Review of Systems   Review of Systems  Constitutional:  Negative for fever.  Respiratory:  Positive for cough, sputum production and shortness of breath. Negative for hemoptysis.   Cardiovascular:  Negative for chest pain.  Gastrointestinal:  Negative for abdominal pain.    Physical Exam Updated Vital Signs BP (!) 128/92   Pulse 92   Temp 97.9 F (36.6 C) (Oral)   Resp (!) 29   Ht 6\' 1"  (1.854 m)   Wt 71.2 kg   SpO2 100%   BMI 20.71 kg/m  Physical Exam Vitals and nursing note reviewed.  Constitutional:      General: He is not in acute distress.    Appearance: He is well-developed.  HENT:     Head: Normocephalic and atraumatic.  Eyes:     Conjunctiva/sclera: Conjunctivae normal.  Cardiovascular:     Rate and Rhythm: Normal rate and regular rhythm.     Heart sounds: No murmur heard. Pulmonary:     Effort: Tachypnea and accessory muscle usage  present. No respiratory distress.     Breath sounds: Decreased breath sounds present.  Abdominal:     Palpations: Abdomen is soft.     Tenderness: There is no abdominal tenderness.  Musculoskeletal:        General: No swelling.     Cervical back: Neck supple.     Right lower leg: Tenderness present. No edema.     Left lower leg: No edema.  Skin:    General: Skin is warm and dry.     Capillary Refill: Capillary refill takes less than 2 seconds.  Neurological:     General: No focal deficit present.     Mental Status:  He is alert.     ED Results / Procedures / Treatments   Labs (all labs ordered are listed, but only abnormal results are displayed) Labs Reviewed  BASIC METABOLIC PANEL WITH GFR - Abnormal; Notable for the following components:      Result Value   CO2 21 (*)    Glucose, Bld 104 (*)    All other components within normal limits  BRAIN NATRIURETIC PEPTIDE - Abnormal; Notable for the following components:   B Natriuretic Peptide 1,450.8 (*)    All other components within normal limits  CBC WITH DIFFERENTIAL/PLATELET - Abnormal; Notable for the following components:   RBC 3.94 (*)    Hemoglobin 11.8 (*)    HCT 37.8 (*)    RDW 17.9 (*)    All other components within normal limits  TROPONIN I (HIGH SENSITIVITY) - Abnormal; Notable for the following components:   Troponin I (High Sensitivity) 39 (*)    All other components within normal limits  TROPONIN I (HIGH SENSITIVITY) - Abnormal; Notable for the following components:   Troponin I (High Sensitivity) 40 (*)    All other components within normal limits  SARS CORONAVIRUS 2 BY RT PCR    EKG EKG Interpretation Date/Time:  Monday Jul 16 2023 10:52:36 EDT Ventricular Rate:  93 PR Interval:  153 QRS Duration:  102 QT Interval:  396 QTC Calculation: 493 R Axis:   104  Text Interpretation: Sinus rhythm Probable right ventricular hypertrophy Borderline prolonged QT interval No significant change since prior 4/25  Confirmed by Racheal Buddle [phone]) on 07/16/2023 11:11:38 AM  Radiology DG Chest Port 1 View Result Date: 07/16/2023 CLINICAL DATA:  Shortness of breath EXAM: PORTABLE CHEST 1 VIEW COMPARISON:  One-view chest x-ray 07/02/2023 FINDINGS: The heart size is normal. Atherosclerotic changes are noted at the aortic arch. Chronic scarring right lung is stable. The left lung is clear. IMPRESSION: 1. No acute cardiopulmonary disease. 2. Chronic scarring right lung. Electronically Signed   By: Audree Leas M.D.   On: 07/16/2023 12:55    Procedures .Critical Care  Performed by: Tonya Fredrickson, MD Authorized by: Tonya Fredrickson, MD   Critical care provider statement:    Critical care time (minutes):  45   Critical care time was exclusive of:  Separately billable procedures and treating other patients   Critical care was necessary to treat or prevent imminent or life-threatening deterioration of the following conditions:  Respiratory failure   Critical care was time spent personally by me on the following activities:  Development of treatment plan with patient or surrogate, discussions with consultants, evaluation of patient's response to treatment, examination of patient, obtaining history from patient or surrogate, ordering and performing treatments and interventions, ordering and review of laboratory studies, ordering and review of radiographic studies, pulse oximetry, re-evaluation of patient's condition and review of old charts   I assumed direction of critical care for this patient from another provider in my specialty: no       Medications Ordered in ED Medications  ipratropium-albuterol  (DUONEB) 0.5-2.5 (3) MG/3ML nebulizer solution 3 mL (has no administration in time range)  methylPREDNISolone  sodium succinate (SOLU-MEDROL ) 125 mg/2 mL injection 125 mg (125 mg Intravenous Given 07/16/23 1108)  magnesium  sulfate IVPB 2 g 50 mL (0 g Intravenous Stopped 07/16/23 1204)   ipratropium-albuterol  (DUONEB) 0.5-2.5 (3) MG/3ML nebulizer solution 3 mL (3 mLs Nebulization Given 07/16/23 1107)  furosemide  (LASIX ) injection 40 mg (40 mg Intravenous Given 07/16/23 1439)    ED Course/  Medical Decision Making/ A&P Clinical Course as of 07/16/23 1647  Mon Jul 16, 2023  1205 X-ray with some mild cephalization.  Awaiting radiology reading. [MB]  1411 Patient states he is feeling back to baseline.  He has been titrated down to 3-1/2 L.  His BNP is elevated so we will give him a dose of Lasix .  Chest x-ray was negative however.  Will also do an ambulatory pulse ox and see how he does. [MB]  1430 Delta troponins are flat. [MB]  1451 Patient states he feels back to baseline and wants to go home.  He is back on his home oxygen  level.  Speaking in full sentences. [MB]  1502 Patient desatted to 84% just trying to get him to the side of the bed to ambulate.  Will admit for COPD CHF exacerbation [MB]    Clinical Course User Index [MB] Tonya Fredrickson, MD                                 Medical Decision Making Amount and/or Complexity of Data Reviewed Labs: ordered. Radiology: ordered.  Risk Prescription drug management. Decision regarding hospitalization.   This patient complains of increased shortness of breath; this involves an extensive number of treatment Options and is a complaint that carries with it a high risk of complications and morbidity. The differential includes COPD, CHF, pneumonia, pneumothorax, vascular, PE  I ordered, reviewed and interpreted labs, which included CBC with stable low hemoglobin, chemistries with low bicarb, COVID-negative, troponins elevated but flat, BNP elevated I ordered medication IV steroids and magnesium , IV Lasix , breathing treatments and reviewed PMP when indicated. I ordered imaging studies which included chest x-ray and I independently    visualized and interpreted imaging which showed no acute findings Additional history  obtained from EMS Previous records obtained and reviewed in epic, recent discharge summary I consulted [facility] hospitalist and discussed lab and imaging findings and discussed disposition.  Cardiac monitoring reviewed, sinus rhythm Social determinants considered, food and financial insecurity Critical Interventions: Workup and management of patient's hypoxia including additional supplemental oxygen  and medications felt to be helpful  After the interventions stated above, I reevaluated the patient and found patient still to be dyspneic with any type of exertion Admission and further testing considered, patient would benefit from mission to the hospital for further management.  Care signed out to Dr. Lewis Red to review with hospitalist.         Final Clinical Impression(s) / ED Diagnoses Final diagnoses:  SOB (shortness of breath)  Acute respiratory failure with hypoxia [facility])    Rx / DC Orders ED Discharge Orders     None         Tonya Fredrickson, MD 07/16/23 1650

## 2023-07-16 NOTE — Progress Notes (Signed)
 Paramedicine Encounter    Patient ID: Bruce [name], male    DOB: [birth year], 78 y.o.   MRN: [id number]   Complaints- shortness of breath, weight gain, lower leg edema   Assessment- CAOX4, pale and dry seated on his bed short of breath wearing his nasal cannula, breathing rapidly. He reports feeling short of breath and having lower leg swelling and pain. Weight is up 9 lbs from last visit. Lower leg edema +3 in both legs. Lungs diminished throughout. 02 84% on 6LPM.    Compliance with meds- missed yesterday morning, noon and night meds   Pill box filled- did not fill as he was transported to ER   Refills needed- unknown he was transported to ER   Meds changes since last visit- none    Social changes- none    VISIT SUMMARY- Arrived for home visit with Maxin to find him sitting on the edge of his bed CAOX4 pale and dry with obvious respiratory distress. He was breathing about 26 times a min, O2 on 6LPM was 84%. Lungs were diminished on auscultation. He reports lower leg swelling and pain. +3 BLE noted. He is up 9lbs from last weeks visit. He only missed one day of meds which was yesterday. He says he is urinating frequently but not a lot. He agreed to be transported to the hospital. [facility] arrived and we used the stair chair to get him down the porch and then assisted him in a stand and pivot to the stretcher. He was moved to EMS oxygen  and placed in their unit and taken to [facility]. I will plan to follow up once he is discharged.   BP 102/64   Pulse 100   Resp (!) 24   Wt 157 lb (71.2 kg)   SpO2 (!) 84% Comment: 6lpm  BMI 20.71 kg/m  Weight yesterday-did not weigh  Last visit weight-- 148lbs      ACTION: Home visit completed     Patient Care Team: Trenda Frisk, FNP as PCP - General (Family Medicine) Cherrie Cornwall, MD as PCP - Cardiology (Cardiology) Claudene Crystal, RN as [facility] Care Management  Patient Active Problem List   Diagnosis Date Noted   Empyema  lung (HCC) 07/04/2023   Severe sepsis (HCC) 07/03/2023   Empyema (HCC) 07/02/2023   Medication management 07/02/2023   Hypoxia 07/02/2023   Hypokalemia 07/02/2023   Acute on chronic systolic CHF (congestive heart failure) (HCC) 07/02/2023   History of CAD (coronary artery disease) 07/02/2023   COPD (chronic obstructive pulmonary disease) (HCC) 07/02/2023   History of pulmonary embolism 07/02/2023   History of DVT (deep vein thrombosis) 07/02/2023   History of cigarette smoking 07/02/2023   History of lung cancer 07/02/2023   History of colon cancer 07/02/2023   History of prostate cancer 07/02/2023   COPD with acute exacerbation (HCC) 07/02/2023   Physical deconditioning 06/14/2023   NICM (nonischemic cardiomyopathy) (HCC) 05/26/2023   Pulmonary arterial hypertension (HCC) 05/26/2023   Noncompliance 05/26/2023   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   Pleural effusion, right 05/24/2023   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   Pulmonary embolism on right (HCC) 05/24/2023   Neuropathy 05/19/2023   Acute on chronic respiratory failure with hypoxia (HCC) 04/19/2023   Acute decompensated heart failure (HCC) 04/18/2023   Prolonged Q-T interval on ECG 04/18/2023   Influenza A 04/18/2023   Cor pulmonale (chronic) (HCC) 04/18/2023   Peripheral artery disease (HCC) 12/18/2022   B12 deficiency 12/18/2022  MDD (major depressive disorder), recurrent episode, moderate (HCC) 09/01/2022   Pulmonary hypertension (HCC) 04/14/2022   Acute on chronic combined systolic and diastolic CHF (congestive heart failure) (HCC) 04/13/2022   Pulmonary embolism (HCC) 04/09/2022   Paroxysmal atrial fibrillation (HCC)    NSTEMI (non-ST elevated myocardial infarction) (HCC) 10/10/2018   Syncope 04/28/2018   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   Limb ischemia 11/18/2013   Acute blood loss anemia 10/08/2013   Anxiety disorder 10/08/2013   Dyslipidemia 10/08/2013   GERD  (gastroesophageal reflux disease) 10/08/2013   Anticoagulated 10/08/2013   Hypertension    CAD (coronary artery disease) 08/16/2010   Acute exacerbation of chronic obstructive pulmonary disease (COPD) (HCC) 08/16/2010   DVT of leg (deep venous thrombosis) (HCC) 08/16/2010    Current Outpatient Medications:    albuterol  (VENTOLIN  HFA) 108 (90 Base) MCG/ACT inhaler, TAKE TWO PUFFS BY MOUTH AS NEEDED FOR WHEEZING EVERY 4-6 HOURS (Patient taking differently: Inhale 2 puffs into the lungs every 4 (four) hours as needed for wheezing or shortness of breath.), Disp: 8.5 g, Rfl: 5   ALPRAZolam  (XANAX ) 0.5 MG tablet, Take 1 tablet (0.5 mg total) by mouth 2 (two) times daily as needed for anxiety., Disp: 60 tablet, Rfl: 1   apixaban  (ELIQUIS ) 5 MG TABS tablet, TAKE ONE TABLET (5 MG TOTAL) BY MOUTH TWO (TWO) TIMES DAILY. (Patient taking differently: Take 5 mg by mouth 2 (two) times daily.), Disp: 60 tablet, Rfl: 11   bisoprolol  (ZEBETA ) 5 MG tablet, Take 0.5 tablets (2.5 mg total) by mouth daily., Disp: 45 tablet, Rfl: 3   budesonide  (PULMICORT ) 0.25 MG/2ML nebulizer solution, Take 2 mLs (0.25 mg total) by nebulization 2 (two) times daily., Disp: 60 mL, Rfl: 12   cetirizine  (ZYRTEC ) 10 MG tablet, Take 1 tablet (10 mg total) by mouth daily. (Patient taking differently: Take 10 mg by mouth in the morning.), Disp: 30 tablet, Rfl: 11   cholecalciferol  (VITAMIN D3) 25 MCG (1000 UNIT) tablet, Take 1,000 Units by mouth in the morning., Disp: , Rfl:    FARXIGA  10 MG TABS tablet, TAKE ONE TABLET (10 MG TOTAL) BY MOUTH DAILY. (Patient taking differently: Take 10 mg by mouth in the morning.), Disp: 90 tablet, Rfl: 1   feeding supplement (ENSURE ENLIVE / ENSURE PLUS) LIQD, Take 237 mLs by mouth 2 (two) times daily between meals., Disp: 10000 mL, Rfl: 0   fluticasone  (FLONASE ) 50 MCG/ACT nasal spray, Place 1 spray into both nostrils daily as needed for allergies., Disp: , Rfl:    furosemide  (LASIX ) 20 MG tablet, Take 2  tablets (40 mg total) by mouth daily. (Patient taking differently: Take 40 mg by mouth in the morning.), Disp: 60 tablet, Rfl: 11   gabapentin  (NEURONTIN ) 100 MG capsule, TAKE ONE CAPSULE (100 MG TOTAL) BY MOUTH THREE (THREE) TIMES DAILY. (Patient taking differently: Take 100 mg by mouth 3 (three) times daily.), Disp: 90 capsule, Rfl: 1   lidocaine  (LIDODERM ) 5 %, Place 1 patch onto the skin daily. Remove & Discard patch within 12 hours or as directed by MD, Disp: 30 patch, Rfl: 11   Multiple Vitamin (MULTIVITAMIN) tablet, Take 1 tablet by mouth in the morning. For Men, Disp: , Rfl:    Omega-3 Fatty Acids (FISH OIL PO), Take 1,000 mg by mouth every evening., Disp: , Rfl:    omeprazole  (PRILOSEC) 40 MG capsule, Take 1 capsule (40 mg total) by mouth 2 (two) times daily., Disp: 180 capsule, Rfl: 1   OXYGEN , Inhale 3 L/min into the lungs continuous.,  Disp: , Rfl:    potassium chloride  SA (KLOR-CON  M) 20 MEQ tablet, Take 1 tablet (20 mEq total) by mouth daily., Disp: 30 tablet, Rfl: 0   rOPINIRole  (REQUIP ) 1 MG tablet, TAKE ONE TABLET (1 MG TOTAL) BY MOUTH IN THE MORNING AND AT BEDTIME. (Patient taking differently: Take 1 mg by mouth.), Disp: 60 tablet, Rfl: 3   rosuvastatin  (CRESTOR ) 20 MG tablet, Take 1 tablet (20 mg total) by mouth daily. (Patient taking differently: Take 20 mg by mouth every evening.), Disp: 30 tablet, Rfl: 2   sildenafil  (REVATIO ) 20 MG tablet, Take 1 tablet (20 mg total) by mouth 3 (three) times daily., Disp: 90 tablet, Rfl: 5   Tiotropium Bromide-Olodaterol (STIOLTO RESPIMAT ) 2.5-2.5 MCG/ACT AERS, Inhale 2 puffs into the lungs daily. (Patient taking differently: Inhale 2 puffs into the lungs in the morning.), Disp: 4 g, Rfl: 12 Allergies  Allergen Reactions   Adhesive [Tape] Other (See Comments)    After right leg fracture surgery, pt developed a large blister where tape was applied to his right leg. OK to use paper tape.   Eggs-Apples-Oats [Alitraq] Other (See Comments)     Caused chest pains   Imdur  [Isosorbide  Dinitrate] Other (See Comments)    hallucinations   Singulair  [Montelukast  Sodium] Other (See Comments)    Hallucinations      Social History   Socioeconomic History   Marital status: Single    Spouse name: Not on file   Number of children: 2   Years of education: Not on file   Highest education level: 6th grade  Occupational History   Not on file  Tobacco Use   Smoking status: Former    Current packs/day: 0.00    Average packs/day: 0.5 packs/day for 52.0 years (26.0 ttl pk-yrs)    Types: Cigarettes    Start date: 07/05/1970    Quit date: 07/05/2022    Years since quitting: 1.0   Smokeless tobacco: Never  Vaping Use   Vaping status: Never Used  Substance and Sexual Activity   Alcohol use: No    Alcohol/week: 0.0 standard drinks of alcohol    Comment: Stopped 2009   Drug use: No   Sexual activity: Not Currently  Other Topics Concern   Not on file  Social History Narrative   ** Merged History Encounter **       Social Drivers of Health   Financial Resource Strain: High Risk (04/12/2022)   Overall Financial Resource Strain (CARDIA)    Difficulty of Paying Living Expenses: Hard  Food Insecurity: Food Insecurity Present (07/09/2023)   Hunger Vital Sign    Worried About Running Out of Food in the Last Year: Sometimes true    Ran Out of Food in the Last Year: Sometimes true  Transportation Needs: No Transportation Needs (07/09/2023)   PRAPARE - Administrator, Civil Service (Medical): No    Lack of Transportation (Non-Medical): No  Recent Concern: Transportation Needs - Unmet Transportation Needs (05/25/2023)   PRAPARE - Administrator, Civil Service (Medical): Yes    Lack of Transportation (Non-Medical): No  Physical Activity: Inactive (02/05/2017)   Exercise Vital Sign    Days of Exercise per Week: 0 days    Minutes of Exercise per Session: 0 min  Stress: Stress Concern Present (02/05/2017)   [employer]  of Occupational Health - Occupational Stress Questionnaire    Feeling of Stress : Rather much  Social Connections: Unknown (07/03/2023)   Social Connection and Isolation  Panel [NHANES]    Frequency of Communication with Friends and Family: Twice a week    Frequency of Social Gatherings with Friends and Family: Once a week    Attends Religious Services: Patient declined    Database administrator or Organizations: No    Attends Banker Meetings: Never    Marital Status: Divorced  Catering manager Violence: Not At Risk (07/09/2023)   Humiliation, Afraid, Rape, and Kick questionnaire    Fear of Current or Ex-Partner: No    Emotionally Abused: No    Physically Abused: No    Sexually Abused: No    Physical Exam      Future Appointments  Date Time Provider Department Center  07/17/2023 11:00 AM Homsher, Candice Chalet, RN [facility] None  07/26/2023  9:00 AM Charlette Console, FNP [facility] None  08/13/2023  1:15 PM Trenda Frisk, FNP [facility] None

## 2023-07-16 NOTE — ED Notes (Signed)
 CCMD called by this RN to monitor the pt.

## 2023-07-16 NOTE — ED Provider Notes (Signed)
  Physical Exam  BP 136/85   Pulse 92   Temp 98 F (36.7 C) (Oral)   Resp (!) 22   Ht 6\' 1"  (1.854 m)   Wt 71.2 kg   SpO2 100%   BMI 20.71 kg/m   Physical Exam  Procedures  Procedures  ED Course / MDM   Clinical Course as of 07/16/23 1521  Mon Jul 16, 2023  1205 X-ray with some mild cephalization.  Awaiting radiology reading. [MB]  1411 Patient states he is feeling back to baseline.  He has been titrated down to 3-1/2 L.  His BNP is elevated so we will give him a dose of Lasix .  Chest x-ray was negative however.  Will also do an ambulatory pulse ox and see how he does. [MB]  1430 Delta troponins are flat. [MB]  1451 Patient states he feels back to baseline and wants to go home.  He is back on his home oxygen  level.  Speaking in full sentences. [MB]  1502 Patient desatted to 84% just trying to get him to the side of the bed to ambulate.  Will admit for COPD CHF exacerbation [MB]    Clinical Course User Index [MB] Tonya Fredrickson, MD   Medical Decision Making Amount and/or Complexity of Data Reviewed Labs: ordered. Radiology: ordered.  Risk Prescription drug management.   Admit for COPD and CHF exacerbation. Normally on 3.5 L, currently on 6 L. Likely COPD, recent CT PE neg.        Deatra Face, MD 07/16/23 [id number]

## 2023-07-16 NOTE — ED Triage Notes (Signed)
 Patient arrives via [facility] after [facility] paramedic was doing weekly rounds and found patient to be short of breath, on chronic 3 L at 83%. On 15 L at 97%. Lungs clear, increased work of breathing. RR 30-50. Gained 9lb over last week- now compliant with meds. Lower extremity edema. CHF and COPD. 18 RAC. 12 lead unremarkable. Patient endorses congestion over last few days. Alert oriented x4.   118 CBG 118/80 BP HR 94

## 2023-07-16 NOTE — H&P (Signed)
 History and Physical    Bruce [name]:[id number] DOB: 02-28-1946 DOA: 07/16/2023  PCP: Trenda Frisk, FNP  Patient coming from: Home  I have personally briefly reviewed patient's old medical records available.   Chief Complaint: Patient was brought with EMS with complaints of shortness of breath ongoing.  He was seen by home health care nurse and she recommended him to go to the ER.  Patient with multiple recent admissions and advanced cardiopulmonary disease.  HPI: Bruce [name] is a 78 y.o. male with medical history significant of end-stage COPD, congestive heart failure with known ejection fraction 20 to 25%, severe pulmonary hypertension, history of pulmonary embolism, DVT, coronary artery disease, peripheral arterial disease, paroxysmal A-fib and smoker, history of colon cancer and prostate cancer who was recently admitted multiple times to the hospital for shortness of breath and multifactorial hypoxemia presents back to the emergency room with shortness of breath acute on chronic, increased work of breathing, found 83% on 3 L oxygen  at home.  Patient tells me that he has been in a very poor shape for some time now.  He is having hard time walking.  His left ankle hurts.  His legs are swollen.  He he was trying to stay home on oxygen  and use nebulizers.  Apparently, he was seen by [facility] paramedic for weekly rounds at home who found him visibly dyspneic, tachypneic with respiratory rate of 30-50.  Gained 9 pound weight over last week.  Patient himself tells me that he just does not feel well and never able to catch breath.  Denies any fever or chills.  He is always cold.  Denies recent sick contacts. Recently admitted with similar symptoms.   ED Course: Afebrile.  Tachypneic.  Blood pressure stable.  Oxygen  variable anywhere between 3 L to 6 L.  WBC count normal.  Troponins 39-40.  BNP 1450. chest x-ray with hyperinflated lungs and chronic scarring. CT angiogram 4/29 with similar  parenchyma.  Was negative for PE.  Patient stated compliance to Eliquis . Patient was given dose of Lasix  IV 40 mg, Solu-Medrol  125 mg IV once, albuterol  and admission requested due to ongoing hypoxemia, unable to mobilize safely without dropping oxygen  levels.  Review of Systems: all systems are reviewed and pertinent positive as per HPI otherwise rest are negative.    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 01/01/2015   Complication of anesthesia    wake up with a head ache   COPD (chronic obstructive pulmonary disease) (HCC)    Coronary artery disease    Edema 08/16/2010   GERD (gastroesophageal reflux disease)    Headache    related to sinus congestion   History of blood transfusion    History of kidney stones    History of MI (myocardial infarction) 10/08/2013   Hypertension    Hypokalemia 04/09/2022   Intracranial bleed (HCC) 12/31/2018   Lactic acidosis 04/09/2022   Leg cramps 08/16/2010   Malignant neoplasm of prostate (HCC) 07/13/2016   Multiple closed  facial bone fractures (HCC)    Myocardial infarction [facility])    '09 AND '12   Peripheral vascular disease (HCC)    Shortness of breath    With exertion .   Tibia/fibula fracture 10/06/2013   Tibial plateau fracture 12/29/2014   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HCC) 10/08/2013   UTI (urinary tract infection)    frequent UTI   Visual hallucination 03/18/2015    Past Surgical History:  Procedure Laterality Date   CARDIAC CATHETERIZATION     CHOLECYSTECTOMY     EXTERNAL FIXATION LEG Right 10/06/2013    Procedure: CLOSED REDUCTION RIGHT TIBIAL PLATEAU FRACTURE, EXTERNAL FIXATION RIGHT LEG, PLACEMENT OF WOUND VAC;  Surgeon: Arlette Lagos, MD;  Location: [facility] OR;  Service: Orthopedics;  Laterality: Right;   FEMORAL-POPLITEAL BYPASS GRAFT Right 10/06/2013   Procedure: RIGHT POPLITEAL-POPLITEAL ARTERY BYPASS GRAFT;  Surgeon: Mayo Speck, MD;  Location: [facility] OR;  Service: Vascular;  Laterality: Right;   FRACTURE SURGERY Right 2014   HARDWARE REMOVAL Right 12/02/2013   Procedure: REMOVAL EXTERNAL FIXATION RIGHT LEG ;  Surgeon: Arlette Lagos, MD;  Location: [facility] OR;  Service: Orthopedics;  Laterality: Right;   HERNIA REPAIR Right 1990's   I & D EXTREMITY Right 10/09/2013   Procedure: IRRIGATION AND DEBRIDEMENT RIGHT LEG, CLOSURE  OF WOUNDS, PLACEMENT OF WOUND VAC ON EACH SIDE OF LEG;  Surgeon: Arlette Lagos, MD;  Location: [facility] OR;  Service: Orthopedics;  Laterality: Right;   IR ANGIOGRAM PULMONARY RIGHT SELECTIVE  04/10/2022   IR ANGIOGRAM SELECTIVE EACH ADDITIONAL VESSEL  04/10/2022   IR NEPHROSTOMY PLACEMENT LEFT  06/19/2016   IR THORACENTESIS ASP PLEURAL SPACE W/IMG GUIDE  05/31/2023   IR THROMBECT PRIM MECH INIT (INCLU) MOD SED  04/10/2022   IR US  GUIDE VASC ACCESS RIGHT  04/10/2022   IVC filter  2009   placed @ [facility]/ Removed in 2010.   IVC Filter Removed     LEFT HEART CATH AND CORONARY ANGIOGRAPHY Right 10/11/2018   Procedure: LEFT HEART CATH AND CORONARY ANGIOGRAPHY;  Surgeon: Cherrie Cornwall, MD;  Location: [facility] INVASIVE CV LAB;  Service: Cardiovascular;  Laterality: Right;   ligament leg Left    NEPHROLITHOTOMY Left 06/19/2016   Procedure: NEPHROLITHOTOMY PERCUTANEOUS;  Surgeon: Dustin Gimenez, MD;  Location: [facility] ORS;  Service: Urology;  Laterality: Left;   ORIF TIBIA FRACTURE Right 12/29/2014   Procedure: OPEN REDUCTION INTERNAL FIXATION (ORIF) RIGHT TIBIA FRACTURE, RIA VS ICBG;  Surgeon: Hardy Lia, MD;  Location: [facility] OR;  Service: Orthopedics;  Laterality: Right;   RADIOACTIVE SEED IMPLANT N/A  09/12/2016   Procedure: RADIOACTIVE SEED IMPLANT/BRACHYTHERAPY IMPLANT;  Surgeon: Dustin Gimenez, MD;  Location: [facility] ORS;  Service: Urology;  Laterality: N/A;   RIGHT/LEFT HEART CATH AND CORONARY ANGIOGRAPHY N/A 04/13/2022   Procedure: RIGHT/LEFT HEART CATH AND CORONARY ANGIOGRAPHY;  Surgeon: Mardell Shade, MD;  Location: [facility] INVASIVE CV LAB;  Service: Cardiovascular;  Laterality: N/A;    Social history   reports that he quit smoking about a year ago. His smoking use included cigarettes. He started smoking about 53 years ago. He has a 26 pack-year smoking history. He has never used smokeless tobacco. He reports that he does not drink alcohol and does not use drugs.  Allergies  Allergen Reactions   Eggs-Apples-Oats [Alitraq] Other (See Comments)    -Caused chest pains   Adhesive [Tape] Other (See Comments)    After right leg fracture surgery, pt developed a large blister where tape was applied to his right  leg. OK to use paper tape.   Imdur  [Isosorbide  Dinitrate] Other (See Comments)    -hallucinations   Singulair  [Montelukast  Sodium] Other (See Comments)    -Hallucinations     Family History  Problem Relation Age of Onset   Hypertension Mother    Prostate cancer Brother    Mental illness Neg Hx      Prior to Admission medications   Medication Sig Start Date End Date Taking? Authorizing Provider  albuterol  (VENTOLIN  HFA) 108 (90 Base) MCG/ACT inhaler TAKE TWO PUFFS BY MOUTH AS NEEDED FOR WHEEZING EVERY 4-6 HOURS Patient taking differently: Inhale 2 puffs into the lungs every 4 (four) hours as needed for wheezing or shortness of breath. 11/10/22  Yes Cherrie Cornwall, MD  ALPRAZolam  (XANAX ) 0.5 MG tablet Take 1 tablet (0.5 mg total) by mouth 2 (two) times daily as needed for anxiety. 05/02/23  Yes Trenda Frisk, FNP  apixaban  (ELIQUIS ) 5 MG TABS tablet TAKE ONE TABLET (5 MG TOTAL) BY MOUTH TWO (TWO) TIMES DAILY. Patient taking differently: Take 5 mg by mouth 2 (two) times  daily. 09/26/22  Yes Bensimhon, Rheta Celestine, MD  bisoprolol  (ZEBETA ) 5 MG tablet Take 0.5 tablets (2.5 mg total) by mouth daily. 06/14/23  Yes Sheryl Donna, NP  budesonide  (PULMICORT ) 0.25 MG/2ML nebulizer solution Take 2 mLs (0.25 mg total) by nebulization 2 (two) times daily. 04/24/23  Yes Malen Scudder, DO  cetirizine  (ZYRTEC ) 10 MG tablet Take 1 tablet (10 mg total) by mouth daily. Patient taking differently: Take 10 mg by mouth in the morning. 05/12/22  Yes Glendale Landmark, NP  cholecalciferol  (VITAMIN D3) 25 MCG (1000 UNIT) tablet Take 1,000 Units by mouth in the morning.   Yes [provider]  FARXIGA  10 MG TABS tablet TAKE ONE TABLET (10 MG TOTAL) BY MOUTH DAILY. Patient taking differently: Take 10 mg by mouth in the morning. 12/15/22  Yes [place], Arlice Bene, FNP  fluticasone  (FLONASE ) 50 MCG/ACT nasal spray Place 1 spray into both nostrils daily as needed for allergies. 03/26/23  Yes [provider]  furosemide  (LASIX ) 20 MG tablet Take 2 tablets (40 mg total) by mouth daily. Patient taking differently: Take 40 mg by mouth in the morning. 05/23/23 05/22/24 Yes [place], Arlice Bene, FNP  gabapentin  (NEURONTIN ) 100 MG capsule TAKE ONE CAPSULE (100 MG TOTAL) BY MOUTH THREE (THREE) TIMES DAILY. Patient taking differently: Take 100 mg by mouth 3 (three) times daily. 06/28/23  Yes Trenda Frisk, FNP  lidocaine  (LIDODERM ) 5 % Place 1 patch onto the skin daily. Remove & Discard patch within 12 hours or as directed by MD 05/12/22  Yes Boswell, Chelsa, NP  Multiple Vitamin (MULTIVITAMIN) tablet Take 1 tablet by mouth in the morning. For Men   Yes [provider]  Omega-3 Fatty Acids (FISH OIL PO) Take 1,000 mg by mouth every evening.   Yes [provider]  omeprazole  (PRILOSEC) 40 MG capsule Take 1 capsule (40 mg total) by mouth 2 (two) times daily. 05/02/23  Yes Trenda Frisk, FNP  OXYGEN  Inhale 3 L/min into the lungs continuous.   Yes [provider]  potassium  chloride SA (KLOR-CON  M) 20 MEQ tablet Take 1 tablet (20 mEq total) by mouth daily. 07/05/23 08/04/23 Yes Arrien, Curlee Doss, MD  predniSONE  (DELTASONE ) 20 MG tablet Take 3 tablets (60 mg total) by mouth daily. 07/16/23  Yes Tonya Fredrickson, MD  rOPINIRole  (REQUIP ) 1 MG tablet TAKE ONE TABLET (1 MG TOTAL) BY MOUTH IN THE  MORNING AND AT BEDTIME. Patient taking differently: Take 1 mg by mouth. 05/23/23  Yes Trenda Frisk, FNP  rosuvastatin  (CRESTOR ) 20 MG tablet Take 1 tablet (20 mg total) by mouth daily. Patient taking differently: Take 20 mg by mouth every evening. 04/24/23  Yes Malen Scudder, DO  sildenafil  (REVATIO ) 20 MG tablet Take 1 tablet (20 mg total) by mouth 3 (three) times daily. 01/15/23  Yes [place], Arlice Bene, FNP  Tiotropium Bromide-Olodaterol (STIOLTO RESPIMAT ) 2.5-2.5 MCG/ACT AERS Inhale 2 puffs into the lungs daily. Patient taking differently: Inhale 2 puffs into the lungs in the morning. 04/24/23 04/23/24 Yes Malen Scudder, DO  feeding supplement (ENSURE ENLIVE / ENSURE PLUS) LIQD Take 237 mLs by mouth 2 (two) times daily between meals. Patient not taking: Reported on 07/16/2023 06/06/23   Kraig Peru, MD    Physical Exam: Vitals:   07/16/23 1500 07/16/23 1510 07/16/23 1535 07/16/23 1544  BP:  136/85    Pulse:  92 94   Resp:  (!) 22 (!) 24   Temp:      TempSrc:      SpO2: 91% 100% 95% 95%  Weight:      Height:        Constitutional: Chronically sick looking.  Frail and debilitated.  In moderate distress with shortness of breath.  Able to talk in complete sentences but tachypneic after talking. Vitals:   07/16/23 1500 07/16/23 1510 07/16/23 1535 07/16/23 1544  BP:  136/85    Pulse:  92 94   Resp:  (!) 22 (!) 24   Temp:      TempSrc:      SpO2: 91% 100% 95% 95%  Weight:      Height:       Eyes: PERRL, lids and conjunctivae normal ENMT: Mucous membranes are moist. Posterior pharynx clear of any exudate or lesions.Normal dentition.  Neck: normal, supple, no  masses, no thyromegaly. Respiratory: Poor bilateral air entry.  On 4 L oxygen .  In moderate distress.  No added sounds. Cardiovascular: Regular rate and rhythm, no murmurs / rubs / gallops.  2+ bilateral pedal edema.  2+ pedal pulses. No carotid bruits.  Visible JVD. Abdomen: no tenderness, no masses palpated. No hepatosplenomegaly. Bowel sounds positive.  Musculoskeletal: no clubbing / cyanosis. No joint deformity upper and lower extremities. Good ROM, no contractures. Normal muscle tone.  Skin: no rashes, lesions, ulcers. No induration Neurologic: CN 2-12 grossly intact. Sensation intact, DTR normal. Strength 5/5 in all 4.  Gross generalized weakness.    Labs on Admission: I have personally reviewed following labs and imaging studies  CBC: Recent Labs  Lab 07/16/23 1119  WBC 7.5  NEUTROABS 6.0  HGB 11.8*  HCT 37.8*  MCV 95.9  PLT 213   Basic Metabolic Panel: Recent Labs  Lab 07/16/23 1119  NA 138  K 4.2  CL 105  CO2 21*  GLUCOSE 104*  BUN 16  CREATININE 0.99  CALCIUM  9.3   GFR: Estimated Creatinine Clearance: 64.9 mL/min (by C-G formula based on SCr of 0.99 mg/dL). Liver Function Tests: No results for input(s): "AST", "ALT", "ALKPHOS", "BILITOT", "PROT", "ALBUMIN " in the last 168 hours. No results for input(s): "LIPASE", "AMYLASE" in the last 168 hours. No results for input(s): "AMMONIA" in the last 168 hours. Coagulation Profile: No results for input(s): "INR", "PROTIME" in the last 168 hours. Cardiac Enzymes: No results for input(s): "CKTOTAL", "CKMB", "CKMBINDEX", "TROPONINI" in the last 168 hours. BNP (last 3 results) No results for input(s): "PROBNP" in  the last 8760 hours. HbA1C: No results for input(s): "HGBA1C" in the last 72 hours. CBG: No results for input(s): "GLUCAP" in the last 168 hours. Lipid Profile: No results for input(s): "CHOL", "HDL", "LDLCALC", "TRIG", "CHOLHDL", "LDLDIRECT" in the last 72 hours. Thyroid  Function Tests: No results for  input(s): "TSH", "T4TOTAL", "FREET4", "T3FREE", "THYROIDAB" in the last 72 hours. Anemia Panel: No results for input(s): "VITAMINB12", "FOLATE", "FERRITIN", "TIBC", "IRON", "RETICCTPCT" in the last 72 hours. Urine analysis:    Component Value Date/Time   COLORURINE YELLOW 07/02/2023 0055   APPEARANCEUR CLEAR 07/02/2023 0055   APPEARANCEUR Clear 04/10/2016 1035   LABSPEC 1.017 07/02/2023 0055   PHURINE 6.0 07/02/2023 0055   GLUCOSEU 150 (A) 07/02/2023 0055   HGBUR NEGATIVE 07/02/2023 0055   BILIRUBINUR NEGATIVE 07/02/2023 0055   BILIRUBINUR Negative 04/10/2016 1035   KETONESUR NEGATIVE 07/02/2023 0055   PROTEINUR NEGATIVE 07/02/2023 0055   NITRITE NEGATIVE 07/02/2023 0055   LEUKOCYTESUR NEGATIVE 07/02/2023 0055    Radiological Exams on Admission: DG Chest Port 1 View Result Date: 07/16/2023 CLINICAL DATA:  Shortness of breath EXAM: PORTABLE CHEST 1 VIEW COMPARISON:  One-view chest x-ray 07/02/2023 FINDINGS: The heart size is normal. Atherosclerotic changes are noted at the aortic arch. Chronic scarring right lung is stable. The left lung is clear. IMPRESSION: 1. No acute cardiopulmonary disease. 2. Chronic scarring right lung. Electronically Signed   By: Audree Leas M.D.   On: 07/16/2023 12:55    EKG: Independently reviewed.  Normal sinus rhythm.  QTc 490.  Comparable to previous EKGs.  Assessment/Plan Principal Problem:   Acute on chronic respiratory failure with hypoxemia (HCC) Active Problems:   Hypertension   Anxiety disorder   Dyslipidemia   GERD (gastroesophageal reflux disease)     1.  Acute on chronic hypoxemic respiratory failure: Multifactorial.  He has severe cardiopulmonary disease and in combination has very poor respiratory reserve. COPD with exacerbation: Agree with admission to monitored unit because of severity of symptoms.  Will keep on progressive unit in case patient needs a BiPAP. Aggressive bronchodilator therapy, IV steroids, inhalational  steroids, scheduled and as needed bronchodilators, deep breathing exercises, incentive spirometry, chest physiotherapy and respiratory therapy consult. Antibiotic currently not indicated. Supplemental oxygen  to keep saturations more than 90%.  BiPAP if needed for severe respiratory distress.  2.  Chronic congestive heart failure and severe pulmonary hypertension: Contribute to above. Patient received Lasix  40 mg IV in the emergency room, will continue Lasix  40 mg by mouth tomorrow morning.  Continue potassium supplementation.  Check electrolytes. Oxygen  management as above. Patient on bisoprolol , sildenafil  that we will continue. Palliative care consultation as below.  3.  History of PE: Stated compliant to Eliquis .  Will continue.  4.  Essential hypertension: Blood pressure is stable.  Resume home medications.  5.  GERD: PPI.  6.  Anxiety disorder: Patient is on gabapentin  and Xanax  that we will continue.  Goal of care: Severe systemic disease.  Likely end-stage.  Recurrent hospitalizations.  I discussed with him about his ongoing issues and difficult to treat conditions.  Discussed about CODE STATUS. Patient confirms DNR/DNI. He is agreeable to talk with palliative care team.  I discussed with him whether he feels he is ready to discuss about palliation and hospice care.  He tells me that he is familiar with discussion with palliative care in the past.  Now he feels like he should talk to them again.  DVT prophylaxis: Eliquis  Code Status: DNR with limited intervention Family Communication: None at  the bedside Disposition Plan: Unknown. Consults called: Palliative care Admission status: Inpatient.  Progressive bed.   Vada Garibaldi MD [facility]

## 2023-07-16 NOTE — ED Notes (Signed)
 Non-re breather was placed on the patient due to his O2 dropping to 79%. Now he is back at 99% on 3.5L [place].

## 2023-07-16 NOTE — ED Notes (Signed)
 Patient dropped to 79% on nasal cannula with good wave form. RN notified.

## 2023-07-16 NOTE — Telephone Encounter (Signed)
 Attempted to reach out to [facility] Triage to notify them of patients symptoms of fluid overload and seek advice however patients symptoms we're ER appropriate and he agreed to be transported to ER for further.   I plan to follow up once he is d/c home.   Roberts Ching, EMT-Paramedic [phone] 07/16/2023

## 2023-07-16 NOTE — ED Notes (Signed)
 RN at bedside

## 2023-07-16 NOTE — Progress Notes (Signed)
 The patient is admitted from ED to 6 E 26. A & O x 4. The patient is oriented to staff, ascom and call bell. Full assessment to epic completed. Will continue to monitor.

## 2023-07-17 ENCOUNTER — Other Ambulatory Visit: Payer: Self-pay

## 2023-07-17 DIAGNOSIS — Z7189 Other specified counseling: Secondary | ICD-10-CM

## 2023-07-17 DIAGNOSIS — I5020 Unspecified systolic (congestive) heart failure: Secondary | ICD-10-CM

## 2023-07-17 DIAGNOSIS — J9621 Acute and chronic respiratory failure with hypoxia: Secondary | ICD-10-CM | POA: Diagnosis not present

## 2023-07-17 DIAGNOSIS — Z515 Encounter for palliative care: Secondary | ICD-10-CM

## 2023-07-17 MED ORDER — IPRATROPIUM-ALBUTEROL 0.5-2.5 (3) MG/3ML IN SOLN
3.0000 mL | Freq: Two times a day (BID) | RESPIRATORY_TRACT | Status: DC
  Administered 2023-07-17 – 2023-07-18 (×2): 3 mL via RESPIRATORY_TRACT
  Filled 2023-07-17 (×2): qty 3

## 2023-07-17 NOTE — Progress Notes (Signed)
 Patient has medication at bedside refuse for us  to take to pharmacy stating he will be going home tomorrow. Explained to patient not to take medications, he agrees. MD has been informed.

## 2023-07-17 NOTE — Plan of Care (Signed)
  Problem: Education: Goal: Knowledge of General Education information will improve Description: Including pain rating scale, medication(s)/side effects and non-pharmacologic comfort measures Outcome: Progressing   Problem: Clinical Measurements: Goal: Will remain free from infection Outcome: Progressing   Problem: Elimination: Goal: Will not experience complications related to urinary retention Outcome: Progressing   Problem: Clinical Measurements: Goal: Ability to maintain clinical measurements within normal limits will improve Outcome: Not Progressing Goal: Respiratory complications will improve Outcome: Not Progressing   Problem: Activity: Goal: Risk for activity intolerance will decrease Outcome: Not Progressing   Problem: Pain Managment: Goal: General experience of comfort will improve and/or be controlled Outcome: Not Progressing   Problem: Skin Integrity: Goal: Risk for impaired skin integrity will decrease Outcome: Not Progressing

## 2023-07-17 NOTE — Evaluation (Signed)
 Physical Therapy Evaluation Patient Details Name: Bruce [name] MRN: [id number] DOB: 1946-02-27 Today's Date: 07/17/2023  History of Present Illness  Patient is a 78 y/o male admitted 07/16/23 with SOB and found to have SpO2 83% on 3L home O2 with RR 30-50 and elevated BNP with 9 pound weight gain.  Of note, pt with recent admissions in April due concern for sepsis, in March 2025 due to SOB with CTA chest showing right mainstem PE now s/p thromolysis and in February 2025 with acute on chronic respiratory failure and volume overload, flu A. PMH: prostate cancer, lung cancer, colon cancer, CHF, COPD, coronary artery disease, GERD, HTN, MI, PVD, HLD, PAD, Hx of DVT & PE, NSTEMI, Afib, smoker, pulmonary hypertension, and medical noncompliance.  Clinical Impression  Patient presents with decreased mobility due to limited activity tolerance.  Mobilizing well otherwise, though SpO2 on 5L at rest 77%, slowly up to 86% with cues for PLB, ambulating in hallway spot check 69% on 6-8L portable O2, back to 89% on 5L after 5 minute rest.  Patient seems to have unsafe living environment with boards over stairs for makeshift ramp and narrow slippery tub, though likely not interested in [facility] safety eval.  Feel could benefit from shower seat and pulse oximeter for home.  PT will follow along in the acute setting.         If plan is discharge home, recommend the following: Help with stairs or ramp for entrance;Assistance with cooking/housework   Can travel by private vehicle        Equipment Recommendations Other (comment) (shower seat and SpO2 monitor)  Recommendations for Other Services       Functional Status Assessment Patient has had a recent decline in their functional status and demonstrates the ability to make significant improvements in function in a reasonable and predictable amount of time.     Precautions / Restrictions Precautions Precautions: Fall;Other (comment) Precaution/Restrictions Comments:  watch O2      Mobility  Bed Mobility Overal bed mobility: Modified Independent                  Transfers Overall transfer level: Modified independent Equipment used: Rolling walker (2 wheels) Transfers: Sit to/from Stand             General transfer comment: no physical help needed, donned socks and shoes at EOB    Ambulation/Gait Ambulation/Gait assistance: Supervision Gait Distance (Feet): 120 Feet Assistive device: Rolling walker (2 wheels) Gait Pattern/deviations: Step-through pattern, Decreased stride length, Trunk flexed       General Gait Details: in hallway on 6-8L O2  Stairs            Wheelchair Mobility     Tilt Bed    Modified Rankin (Stroke Patients Only)       Balance Overall balance assessment: Needs assistance   Sitting balance-Leahy Scale: Good     Standing balance support: Bilateral upper extremity supported, Reliant on assistive device for balance Standing balance-Leahy Scale: Poor                               Pertinent Vitals/Pain Pain Assessment Pain Assessment: Faces Faces Pain Scale: Hurts little more Pain Location: ankles after walking Pain Descriptors / Indicators: Aching, Sore Pain Intervention(s): Monitored during session    Home Living Family/patient expects to be discharged to:: Private residence Living Arrangements: Non-relatives/Friends Available Help at Discharge: Friend(s);Available PRN/intermittently (roomate can help with  meal prep) Type of Home: Mobile home Home Access: Stairs to enter;Other (comment) Entrance Stairs-Rails: Right Entrance Stairs-Number of Steps: 7-8 in front, 4-5 in back; reports his roomate has placed plywood planks for "ramp" though he has fallen on them and hit is "tailbone" recently   Home Layout: One level Home Equipment: Agricultural consultant (2 wheels);Cane - single point (home O2 concentrator, portable O2)      Prior Function Prior Level of Function :  Independent/Modified Independent;Driving;History of Falls (last six months);Working/employed             Mobility Comments: using RW Though does not fit in his hallway       Extremity/Trunk Assessment   Upper Extremity Assessment Upper Extremity Assessment: Overall WFL for tasks assessed         Cervical / Trunk Assessment Cervical / Trunk Assessment: Kyphotic;Other exceptions Cervical / Trunk Exceptions: cachexia  Communication   Communication Communication: Impaired Factors Affecting Communication: Hearing impaired    Cognition Arousal: Alert Behavior During Therapy: WFL for tasks assessed/performed   PT - Cognitive impairments: Safety/Judgement, Memory                       PT - Cognition Comments: repeating same information multiple times in session; seems home set up is not safe with narrow slippery tub and loose planks as make shift ramp on steps that he has fallen on,  per pt Following commands: Intact       Cueing Cueing Techniques: Verbal cues     General Comments General comments (skin integrity, edema, etc.): on 5L O2 at rest 77% SpO2 gradually up to 86%, ambulated on 6-8L with SpO2 69% spot check in hallway, seated rest on 5-6L O2 for SpO2 up to 89%, RN aware    Exercises     Assessment/Plan    PT Assessment Patient needs continued PT services  PT Problem List Decreased strength;Decreased range of motion;Decreased activity tolerance;Decreased balance;Decreased mobility;Decreased safety awareness       PT Treatment Interventions DME instruction;Gait training;Stair training;Functional mobility training;Therapeutic activities;Therapeutic exercise;Balance training    PT Goals (Current goals can be found in the Care Plan section)  Acute Rehab PT Goals Patient Stated Goal: return home PT Goal Formulation: With patient Time For Goal Achievement: 07/31/23 Potential to Achieve Goals: Fair    Frequency Min 2X/week     Co-evaluation                AM-PAC PT "6 Clicks" Mobility  Outcome Measure Help needed turning from your back to your side while in a flat bed without using bedrails?: None Help needed moving from lying on your back to sitting on the side of a flat bed without using bedrails?: None Help needed moving to and from a bed to a chair (including a wheelchair)?: None Help needed standing up from a chair using your arms (e.g., wheelchair or bedside chair)?: None Help needed to walk in hospital room?: A Little Help needed climbing 3-5 steps with a railing? : A Little 6 Click Score: 22    End of Session Equipment Utilized During Treatment: Oxygen  Activity Tolerance: Patient limited by fatigue Patient left: in bed;with call bell/phone within reach Nurse Communication: Other (comment) (SpO2) PT Visit Diagnosis: Unsteadiness on feet (R26.81);Muscle weakness (generalized) (M62.81)    Time: 1914-7829 PT Time Calculation (min) (ACUTE ONLY): 25 min   Charges:   PT Evaluation $PT Eval Moderate Complexity: 1 Mod PT Treatments $Gait Training: 8-22 mins PT General  Charges $$ ACUTE PT VISIT: 1 Visit         Abigail Hoff, PT Acute Rehabilitation Services Office:[phone] 07/17/2023   Bruce [name] 07/17/2023, 5:34 PM

## 2023-07-17 NOTE — Progress Notes (Signed)
 PROGRESS NOTE    Bruce [name]  ZOX:[id number] DOB: 08-Aug-1945 DOA: 07/16/2023 PCP: Trenda Frisk, FNP    Brief Narrative:  Patient with medical history significant of advanced COPD, congestive heart failure with known ejection fraction 20 to 25%, severe pulmonary hypertension, history of pulmonary embolism, DVT, coronary artery disease, peripheral arterial disease, paroxysmal A-fib and smoker, history of colon cancer and prostate cancer who was recently admitted multiple times to the hospital for shortness of breath and multifactorial hypoxemia presents back to the emergency room with shortness of breath acute on chronic, increased work of breathing, found 83% on 3 L oxygen  at home. Apparently, he was seen by [facility] paramedic for weekly rounds at home who found him visibly dyspneic, tachypneic with respiratory rate of 30-50.  Gained 9 pound weight over last week. Recently admitted with similar symptoms.   ED Course: Afebrile.  Tachypneic.  Blood pressure stable.  Oxygen  variable anywhere between 3 L to 6 L.  WBC count normal.  Troponins 39-40.  BNP 1450. chest x-ray with hyperinflated lungs and chronic scarring. CT angiogram 4/29 with similar parenchyma.  Was negative for PE.  Patient stated compliance to Eliquis . Patient was given dose of Lasix  IV 40 mg, Solu-Medrol  125 mg IV once, albuterol  and admission requested due to ongoing hypoxemia, unable to mobilize safely without dropping oxygen  levels.  Subjective: Patient was seen and examined.  He tells me that he feels much better.  He tells me he is almost back to baseline after rest but not mobilized out of room yet. Patient wants to go home today.  He has not been mobilized yet. He has agreed to talk to palliative care team and may agree with the hospice care at home. Remains afebrile.   Assessment & Plan:   Acute on chronic hypoxemic respiratory failure: Multifactorial.  He has severe cardiopulmonary disease and in combination has  very poor respiratory reserve. Treating with multifactorial approach. Bronchodilator therapy, steroids, and recently steroids, deep breathing exercises, incentive spirometry. Clinically improving today. Oxygen  to keep saturation more than 90%.  Start working with PT OT today.  Chronic congestive heart failure and severe pulmonary hypertension: Contribute to above. Patient received Lasix  40 mg IV in the emergency room, will continue Lasix  40 mg by mouth today.  As he looks euvolemic.  Continue potassium supplementation.   Oxygen  as above. Patient on bisoprolol , sildenafil  that we will continue. Palliative care consultation as below.   History of PE: Stated compliant to Eliquis .  Will continue.   Essential hypertension: Blood pressure is stable.  Resume home medications.   GERD: PPI.   Anxiety disorder: Patient is on gabapentin  and Xanax  that we will continue.  Goal of care: Severe cardiopulmonary comorbidities.  Recurrent hospitalizations and intermittent respiratory distress. DNR and DNI. Agreeable to discussed with palliative care team for potential hospice but not sure he will agree next time.   DVT prophylaxis:  apixaban  (ELIQUIS ) tablet 5 mg   Code Status: DNR with limited intervention Family Communication: None at the bedside Disposition Plan: Status is: Inpatient Remains inpatient appropriate because: Severe respiratory distress     Consultants:  Palliative care  Procedures:  None  Antimicrobials:  None     Objective: Vitals:   07/17/23 0425 07/17/23 0712 07/17/23 0846 07/17/23 1300  BP: 102/67  102/67 (!) 94/59  Pulse: 88 88 86 88  Resp: 18 18 18 17   Temp: 97.8 F (36.6 C)  98 F (36.7 C) 97.8 F (36.6 C)  TempSrc: Oral  Oral Oral  SpO2: 95%  96% 97%  Weight:      Height:        Intake/Output Summary (Last 24 hours) at 07/17/2023 1312 Last data filed at 07/17/2023 0847 Gross per 24 hour  Intake 240 ml  Output 500 ml  Net -260 ml   Filed  Weights   07/16/23 1047 07/16/23 2100  Weight: 71.2 kg 67.6 kg    Examination:  General exam: Appears calm and comfortable  Frail and debilitated.  Chronically sick looking. Respiratory system: Mostly clear.  No added sounds. SpO2: 97 % O2 Flow Rate (L/min): 6 L/min FiO2 (%): 44 %  Cardiovascular system: S1 & S2 heard, RRR.  Chronic changes in his legs.  No pitting edema.  Mostly chronic nonpitting edema. Gastrointestinal system: Abdomen is nondistended, soft and nontender. No organomegaly or masses felt. Normal bowel sounds heard. Central nervous system: Alert and oriented. No focal neurological deficits. Extremities: Symmetric 5 x 5 power. Skin: No rashes, lesions or ulcers Psychiatry: Judgement and insight appear normal. Mood & affect appropriate.     Data Reviewed: I have personally reviewed following labs and imaging studies  CBC: Recent Labs  Lab 07/16/23 1119  WBC 7.5  NEUTROABS 6.0  HGB 11.8*  HCT 37.8*  MCV 95.9  PLT 213   Basic Metabolic Panel: Recent Labs  Lab 07/16/23 1119  NA 138  K 4.2  CL 105  CO2 21*  GLUCOSE 104*  BUN 16  CREATININE 0.99  CALCIUM  9.3   GFR: Estimated Creatinine Clearance: 61.6 mL/min (by C-G formula based on SCr of 0.99 mg/dL). Liver Function Tests: No results for input(s): "AST", "ALT", "ALKPHOS", "BILITOT", "PROT", "ALBUMIN " in the last 168 hours. No results for input(s): "LIPASE", "AMYLASE" in the last 168 hours. No results for input(s): "AMMONIA" in the last 168 hours. Coagulation Profile: No results for input(s): "INR", "PROTIME" in the last 168 hours. Cardiac Enzymes: No results for input(s): "CKTOTAL", "CKMB", "CKMBINDEX", "TROPONINI" in the last 168 hours. BNP (last 3 results) No results for input(s): "PROBNP" in the last 8760 hours. HbA1C: No results for input(s): "HGBA1C" in the last 72 hours. CBG: No results for input(s): "GLUCAP" in the last 168 hours. Lipid Profile: No results for input(s): "CHOL", "HDL",  "LDLCALC", "TRIG", "CHOLHDL", "LDLDIRECT" in the last 72 hours. Thyroid  Function Tests: No results for input(s): "TSH", "T4TOTAL", "FREET4", "T3FREE", "THYROIDAB" in the last 72 hours. Anemia Panel: No results for input(s): "VITAMINB12", "FOLATE", "FERRITIN", "TIBC", "IRON", "RETICCTPCT" in the last 72 hours. Sepsis Labs: No results for input(s): "PROCALCITON", "LATICACIDVEN" in the last 168 hours.  Recent Results (from the past 240 hours)  SARS Coronavirus 2 by RT PCR (hospital order, performed in [facility] hospital lab) *cepheid single result test* Anterior Nasal Swab     Status: None   Collection Time: 07/16/23 10:55 AM   Specimen: Anterior Nasal Swab  Result Value Ref Range Status   SARS Coronavirus 2 by RT PCR NEGATIVE NEGATIVE Final    Comment: Performed at [facility], [address], [place], [place] [address]         Radiology Studies: DG Chest Port 1 View Result Date: 07/16/2023 CLINICAL DATA:  Shortness of breath EXAM: PORTABLE CHEST 1 VIEW COMPARISON:  One-view chest x-ray 07/02/2023 FINDINGS: The heart size is normal. Atherosclerotic changes are noted at the aortic arch. Chronic scarring right lung is stable. The left lung is clear. IMPRESSION: 1. No acute cardiopulmonary disease. 2. Chronic scarring right lung. Electronically Signed  By: Audree Leas M.D.   On: 07/16/2023 12:55        Scheduled Meds:  apixaban   5 mg Oral BID   bisoprolol   2.5 mg Oral Daily   budesonide   0.25 mg Nebulization BID   cholecalciferol   1,000 Units Oral q AM   dapagliflozin  propanediol  10 mg Oral q AM   furosemide   40 mg Oral q AM   gabapentin   100 mg Oral TID   guaiFENesin   600 mg Oral BID   ipratropium-albuterol   3 mL Nebulization BID   loratadine   10 mg Oral Daily   multivitamin with minerals  1 tablet Oral q AM   pantoprazole   40 mg Oral Daily   potassium chloride  SA  20 mEq Oral Daily   predniSONE   40 mg Oral Q breakfast   rOPINIRole   1 mg Oral BID    rosuvastatin   20 mg Oral Daily   sildenafil   20 mg Oral TID   Continuous Infusions:   LOS: 1 day    Time spent: 52 minutes    Vada Garibaldi, MD [facility]

## 2023-07-18 ENCOUNTER — Inpatient Hospital Stay (HOSPITAL_COMMUNITY)

## 2023-07-18 DIAGNOSIS — Z7189 Other specified counseling: Secondary | ICD-10-CM | POA: Diagnosis not present

## 2023-07-18 DIAGNOSIS — F411 Generalized anxiety disorder: Secondary | ICD-10-CM | POA: Diagnosis not present

## 2023-07-18 DIAGNOSIS — J9621 Acute and chronic respiratory failure with hypoxia: Secondary | ICD-10-CM | POA: Diagnosis not present

## 2023-07-18 DIAGNOSIS — Z515 Encounter for palliative care: Secondary | ICD-10-CM | POA: Diagnosis not present

## 2023-07-18 LAB — BASIC METABOLIC PANEL WITH GFR
Anion gap: 9 (ref 5–15)
BUN: 24 mg/dL — ABNORMAL HIGH (ref 8–23)
CO2: 23 mmol/L (ref 22–32)
Calcium: 9.5 mg/dL (ref 8.9–10.3)
Chloride: 103 mmol/L (ref 98–111)
Creatinine, Ser: 1.01 mg/dL (ref 0.61–1.24)
GFR, Estimated: >60 mL/min (ref >60)
Glucose, Bld: 159 mg/dL — ABNORMAL HIGH (ref 70–99)
Potassium: 4.1 mmol/L (ref 3.5–5.1)
Sodium: 135 mmol/L (ref 135–145)

## 2023-07-18 MED ORDER — METHYLPREDNISOLONE SODIUM SUCC 40 MG IJ SOLR
40.0000 mg | Freq: Two times a day (BID) | INTRAMUSCULAR | Status: DC
  Administered 2023-07-18: 40 mg via INTRAVENOUS
  Filled 2023-07-18: qty 1

## 2023-07-18 MED ORDER — OXYCODONE HCL 5 MG PO TABS: 5.0000 mg | ORAL_TABLET | ORAL | 0 refills | Status: DC | PRN

## 2023-07-18 MED ORDER — ALPRAZOLAM 0.5 MG PO TABS: 1.0000 mg | ORAL_TABLET | Freq: Two times a day (BID) | ORAL | Status: DC | PRN

## 2023-07-18 MED ORDER — GUAIFENESIN ER 600 MG PO TB12: 1200.0000 mg | ORAL_TABLET | Freq: Two times a day (BID) | ORAL | 0 refills | Status: DC

## 2023-07-18 MED ORDER — ALPRAZOLAM 0.5 MG PO TABS: 1.0000 mg | ORAL_TABLET | Freq: Three times a day (TID) | ORAL | Status: DC | PRN

## 2023-07-18 MED ORDER — IPRATROPIUM-ALBUTEROL 0.5-2.5 (3) MG/3ML IN SOLN: 3.0000 mL | Freq: Four times a day (QID) | RESPIRATORY_TRACT | Status: DC

## 2023-07-18 MED ORDER — GUAIFENESIN ER 600 MG PO TB12: 1200.0000 mg | ORAL_TABLET | Freq: Two times a day (BID) | ORAL | Status: DC

## 2023-07-18 MED ORDER — IPRATROPIUM-ALBUTEROL 0.5-2.5 (3) MG/3ML IN SOLN
3.0000 mL | Freq: Four times a day (QID) | RESPIRATORY_TRACT | Status: DC
  Administered 2023-07-18: 3 mL via RESPIRATORY_TRACT
  Filled 2023-07-18: qty 3

## 2023-07-18 MED ORDER — ALBUTEROL SULFATE 0.63 MG/3ML IN NEBU: 1.0000 | INHALATION_SOLUTION | Freq: Four times a day (QID) | RESPIRATORY_TRACT | 12 refills | Status: DC | PRN

## 2023-07-18 MED ORDER — DOXYCYCLINE HYCLATE 100 MG PO TABS: 100.0000 mg | ORAL_TABLET | Freq: Two times a day (BID) | ORAL | 0 refills | Status: AC

## 2023-07-18 MED ORDER — ARFORMOTEROL TARTRATE 15 MCG/2ML IN NEBU
15.0000 ug | INHALATION_SOLUTION | Freq: Two times a day (BID) | RESPIRATORY_TRACT | Status: DC
  Administered 2023-07-18: 15 ug via RESPIRATORY_TRACT
  Filled 2023-07-18: qty 2

## 2023-07-18 MED ORDER — ALPRAZOLAM 0.5 MG PO TABS: 0.5000 mg | ORAL_TABLET | Freq: Three times a day (TID) | ORAL | 0 refills | Status: DC | PRN

## 2023-07-18 MED ORDER — DOXYCYCLINE HYCLATE 100 MG PO TABS
100.0000 mg | ORAL_TABLET | Freq: Two times a day (BID) | ORAL | Status: DC
  Administered 2023-07-18: 100 mg via ORAL
  Filled 2023-07-18: qty 1

## 2023-07-18 NOTE — Consult Note (Signed)
 Palliative Care Consult Note                                  Date: 07/18/2023   Patient Name: Bruce [name]  DOB: [birth year]  MRN: [id number]  Age / Sex: 78 y.o., male  PCP: Trenda Frisk, FNP Referring Physician: Danette Duos, MD  Reason for Consultation: Establishing goals of care  HPI/Patient Profile: 78 y.o. male  with past medical history of advanced COPD, HFrEF, severe pulmonary hypertension, history of pulmonary embolism, DVT, artery disease, peripheral artery disease, paroxysmal A-fib, and history of colon cancer and prostate cancer who presented to the ED on 07/16/2023 with worsening shortness of breath and was admitted with acute on chronic respiratory failure, multifactorial due to severe cardiopulmonary disease and poor respiratory reserve.   Patient was recently admitted 4/28 through 5/1 with acute on chronic HFrEF.  Prior to that he was hospitalized 3/20 through 4/2 with acute on chronic PE and streptococcal pneumonia.  Palliative Medicine was consulted for goals of care.   Subjective:   Extensive chart review has been completed  including labs, vital signs, imaging, progress/consult notes, orders, medications and available advance directive documents.    I met with patient at bedside to discuss diagnosis, prognosis, GOC, disposition, and options.  He reports feeling much better and will likely discharge tomorrow.  Patient is known to PMT from a prior hospitalization.  I re-introduced Palliative Medicine as specialized medical care for people living with serious illness.   Created space and opportunity for patient to express thoughts and feelings regarding current medical situation. Values and goals of care were attempted to be elicited.  Life Review: Patient is disabled. He formerly worked as a Psychologist, occupational and as a Transport planner. He is twice divorced. He has 1 son Bruce [name]), who he has "disowned". He had  another son, but is not close to him and has recently discovered he may "not be mine".   Functional Status: Patient lives in a mobile home with a roommate (Bruce [name]). He is able to walk from room to room, but has to sit/rest frequently due to dyspnea.  He is independent with ADLs and continues to drive, although is worried "someone will try to take his keys".  GOC Discussion: We discussed patient's current illness and what it means in the larger context of his ongoing co-morbidities. Reviewed current clinical status.  We reviewed the natural trajectory of COPD and heart failure as progressive and irreversible illnesses.  Patient tells me he "knows he dying" and is ready when "the good Bruce [name] wants to take him". He speaks to the concept that mortality is inevitable, but does not want to talk about goals or details of care when that time comes. His goal is to "live his life how he wants" and "be left alone".  Patient spends most of our encounter discussing multiple grievances; with his son, his roommate, ex-wife, and the police. He tells me his living situation is not good, as his roommate is an alcoholic and overcharges him for rent.   I offered education and counseling on the philosophy and benefits of hospice care. Discussed that hospice can help with personal care, symptom management for pain and dyspnea, and help keep him out of the hospital.  Patient is open to the concept of hospice, but is not comfortable having people to his home due to his roommate's alcohol use disorder.  I inquired who he would he want to who he would want to make medical decisions for him if he was unable to make decisions for himself. He states he would want this person to be his good friend, Bruce [name]. He is open to completing and HCPOA document if possible prior to discharge.   Questions and concerns were addressed.  Emotional support and therapeutic listening was provided.   Review of Systems  Constitutional:   Positive for fatigue.    Objective:   Primary Diagnoses: Present on Admission:  Acute on chronic respiratory failure with hypoxemia (HCC)  Hypertension  Anxiety disorder  Dyslipidemia  GERD (gastroesophageal reflux disease)   Physical Exam Vitals reviewed.  Constitutional:      General: He is not in acute distress.    Comments: Chronically ill-appearing  Cardiovascular:     Rate and Rhythm: Normal rate.  Pulmonary:     Effort: No respiratory distress.  Neurological:     Mental Status: He is alert and oriented to person, place, and time.     Palliative Assessment/Data: PPS 50%     Assessment & Plan:   SUMMARY OF RECOMMENDATIONS   Continue supportive interventions Goal of care is medical stabilization and return home Patient declines hospice services at this time Outpatient palliative referral Spiritual care referral for HCPOA document PMT will continue to follow  Primary Decision Maker: PATIENT  Code Status/Advance Care Planning: DNR/DNI  Prognosis:  < 6 months  Discharge Planning:  Home   Thank you for allowing us  to participate in the care of Bruce [name]   Time Total: 78 minutes  Detailed review of medical records (labs, imaging, vital signs), medically appropriate exam, discussed with treatment team, counseling and education to patient, family, & staff, documenting clinical information, medication management, coordination of care.   Signed by: Maisie Scotland, NP Palliative Medicine Team  Team Phone # [phone]  For individual providers, please see AMION

## 2023-07-18 NOTE — Evaluation (Signed)
 Occupational Therapy Evaluation and Discharge Patient Details Name: Bruce [name] MRN: [id number] DOB: [birth year] Today's Date: 07/18/2023   History of Present Illness   Patient is a 78 y/o male admitted 07/16/23 with SOB and found to have SpO2 83% on 3L home O2 with RR 30-50 and elevated BNP with 9 pound weight gain.  Of note, pt with recent admissions in April due concern for sepsis, in March 2025 due to SOB with CTA chest showing right mainstem PE now s/p thromolysis and in February 2025 with acute on chronic respiratory failure and volume overload, flu A. PMH: prostate cancer, lung cancer, colon cancer, CHF, COPD, coronary artery disease, GERD, HTN, MI, PVD, HLD, PAD, Hx of DVT & PE, NSTEMI, Afib, smoker, pulmonary hypertension, and medical noncompliance.     Clinical Impressions Limited session as pt on HFNC (9L) with O2 saturation of 87%. Mod I with RW to stand and set up for ADLs. Pt with decreased activity tolerance, he is knowledgeable in pursed lip breathing and energy conservation strategies. He has fabricated a wooden bench for seated showering in his tub. Recommended pt consider a tub transfer bench, he refused. Recommending ADLs with nursing staff.  No further OT needs.      If plan is discharge home, recommend the following:   Help with stairs or ramp for entrance;Assistance with cooking/housework     Functional Status Assessment   Patient has not had a recent decline in their functional status     Equipment Recommendations   Other (comment) (pt is declining tub equipment)     Recommendations for Other Services         Precautions/Restrictions   Precautions Precautions: Fall;Other (comment) Precaution/Restrictions Comments: watch O2 Restrictions Weight Bearing Restrictions Per Provider Order: No     Mobility Bed Mobility Overal bed mobility: Modified Independent             General bed mobility comments: HOb up    Transfers Overall transfer  level: Modified independent Equipment used: Rolling walker (2 wheels)               General transfer comment: no physical help needed, donned socks and shoes at EOB      Balance Overall balance assessment: Needs assistance   Sitting balance-Leahy Scale: Good       Standing balance-Leahy Scale: Poor Standing balance comment: can release walker in static standing                           ADL either performed or assessed with clinical judgement   ADL Overall ADL's : Needs assistance/impaired Eating/Feeding: Independent;Sitting   Grooming: Set up;Sitting   Upper Body Bathing: Set up;Sitting   Lower Body Bathing: Set up;Sit to/from stand   Upper Body Dressing : Set up;Sitting   Lower Body Dressing: Set up;Sit to/from stand   Toilet Transfer: Supervision/safety;Rolling walker (2 wheels)   Toileting- Clothing Manipulation and Hygiene: Set up;Sitting/lateral lean         General ADL Comments: pt on HFNC with O2 saturations in upper 80s, limited activity to standing at EOB     Vision Baseline Vision/History: 1 Wears glasses Ability to See in Adequate Light: 0 Adequate Patient Visual Report: No change from baseline       Perception         Praxis         Pertinent Vitals/Pain Pain Assessment Pain Assessment: No/denies pain     Extremity/Trunk Assessment  Upper Extremity Assessment Upper Extremity Assessment: Right hand dominant;Overall [facility] for tasks assessed   Lower Extremity Assessment Lower Extremity Assessment: Defer to PT evaluation   Cervical / Trunk Assessment Cervical / Trunk Assessment: Kyphotic   Communication Communication Communication: Impaired Factors Affecting Communication: Hearing impaired   Cognition Arousal: Alert Behavior During Therapy: WFL for tasks assessed/performed Cognition: Cognition impaired, No family/caregiver present to determine baseline     Awareness: Intellectual awareness intact, Online awareness  intact Memory impairment (select all impairments): Short-term memory Attention impairment (select first level of impairment): Alternating attention Executive functioning impairment (select all impairments): Problem solving OT - Cognition Comments: likely baseline                 Following commands: Intact       Cueing  General Comments   Cueing Techniques: Verbal cues      Exercises     Shoulder Instructions      Home Living Family/patient expects to be discharged to:: Private residence Living Arrangements: Non-relatives/Friends Available Help at Discharge: Friend(s);Available PRN/intermittently (roommate is not reliable) Type of Home: Mobile home Home Access: Ramped entrance Entrance Stairs-Number of Steps: ramp is steep and unsafe per pt   Home Layout: One level     Bathroom Shower/Tub: Chief Strategy Officer: Standard     Home Equipment: Agricultural consultant (2 wheels);Cane - single point;Other (comment) (O2)   Additional Comments: Pt has a make shift seat in his tub made from wood.      Prior Functioning/Environment Prior Level of Function : Independent/Modified Independent;Driving;History of Falls (last six months);Working/employed             Mobility Comments: using RW Though does not fit in his hallway or through doors ADLs Comments: sits on makeshift seat in tub    OT Problem List:     OT Treatment/Interventions:        OT Goals(Current goals can be found in the care plan section)       OT Frequency:       Co-evaluation              AM-PAC OT "6 Clicks" Daily Activity     Outcome Measure Help from another person eating meals?: None Help from another person taking care of personal grooming?: A Little Help from another person toileting, which includes using toliet, bedpan, or urinal?: A Little Help from another person bathing (including washing, rinsing, drying)?: A Little Help from another person to put on and taking  off regular upper body clothing?: A Little Help from another person to put on and taking off regular lower body clothing?: A Little 6 Click Score: 19   End of Session Equipment Utilized During Treatment: Rolling walker (2 wheels);Oxygen   Activity Tolerance: Treatment limited secondary to medical complications (Comment) (HFNC) Patient left: in bed;with call bell/phone within reach;Other (comment) (xray in room)  OT Visit Diagnosis: Other abnormalities of gait and mobility (R26.89);Other (comment) (decreased activity tolerance)                Time: 1610-9604 OT Time Calculation (min): 22 min Charges:  OT General Charges $OT Visit: 1 Visit OT Evaluation $OT Eval Low Complexity: 1 Low  Avanell Leigh, OTR/L Acute Rehabilitation Services Office: [phone]  Jonette Nestle 07/18/2023, 11:36 AM

## 2023-07-18 NOTE — Progress Notes (Signed)
 Discharge instructions (including medications) discussed with and copy provided to patient/caregiver

## 2023-07-18 NOTE — Discharge Summary (Signed)
 Physician Discharge Summary   Patient: Bruce [name] MRN: [id number] DOB: [birth year]  Admit date:     07/16/2023  Discharge date: 07/18/23  Discharge Physician: Bruce [name]   PCP: Bruce Frisk, FNP   Recommendations at discharge:    Home with Hospice.   Discharge Diagnoses: Principal Problem:   Acute on chronic respiratory failure with hypoxemia (HCC) Active Problems:   Hypertension   Anxiety disorder   Dyslipidemia   GERD (gastroesophageal reflux disease)  Resolved Problems:   * No resolved hospital problems. [facility] Course: Patient with medical history significant of advanced COPD, congestive heart failure with known ejection fraction 20 to 25%, severe pulmonary hypertension, history of pulmonary embolism, DVT, coronary artery disease, peripheral arterial disease, paroxysmal A-fib and smoker, history of colon cancer and prostate cancer who was recently admitted multiple times to the hospital for shortness of breath and multifactorial hypoxemia presents back to the emergency room with shortness of breath acute on chronic, increased work of breathing, found 83% on 3 L oxygen  at home. Apparently, he was seen by [facility] paramedic for weekly rounds at home who found him visibly dyspneic, tachypneic with respiratory rate of 30-50.  Gained 9 pound weight over last week. Recently admitted with similar symptoms.   ED Course: Afebrile.  Tachypneic.  Blood pressure stable.  Oxygen  variable anywhere between 3 L to 6 L.  WBC count normal.  Troponins 39-40.  BNP 1450. chest x-ray with hyperinflated lungs and chronic scarring. CT angiogram 4/29 with similar parenchyma.  Was negative for PE.  Patient stated compliance to Eliquis . Patient was given dose of Lasix  IV 40 mg, Solu-Medrol  125 mg IV once, albuterol  and admission requested due to ongoing hypoxemia, unable to mobilize safely without dropping oxygen  levels.  Assessment and Plan: Acute on chronic hypoxemic respiratory failure:  Multifactorial.  He has severe cardiopulmonary disease and in combination has very poor respiratory reserve. Treating with multifactorial approach. Bronchodilator therapy, steroids, and recently steroids, deep breathing exercises, incentive spirometry. Treated with IV steroids, Nebulizer. He Desat today, down to 60 after his oxygen  came off, also he has been anxious today. His oxygen  requirement increased to 11 Oxygen , subsequently was able to be wean down to 5 L. He wants to be discharge home today with hospice care. Discharge on Nebulizer, prednisone , and Hospice care.    Chronic congestive systolic  heart failure and severe pulmonary hypertension: Contribute to above. Patient received Lasix  40 mg IV in the emergency room, [name] continue Lasix  40 mg by mouth today.  As he looks euvolemic.  Continue potassium supplementation.   Oxygen  as above. Patient on bisoprolol , sildenafil  that we [name] continue. Palliative care consultation as below.   History of PE: Stated compliant to Eliquis .  Continue.    Essential hypertension: Blood pressure is stable.  Resume home medications.   GERD: PPI.   Anxiety disorder: Patient is on gabapentin  and Xanax  that we [name] continue.   Goal of care: Severe cardiopulmonary comorbidities.  Recurrent hospitalizations and intermittent respiratory distress. DNR and DNI. Plan to discharge home with hospice care. Discharge on Oxy and Xanax  PRN            Consultants: Palliative care Procedures performed: None Disposition: Home Diet recommendation:  Discharge Diet Orders (From admission, onward)     Start     Ordered   07/18/23 0000  Diet - low sodium heart healthy        07/18/23 1406  Cardiac diet DISCHARGE MEDICATION: Allergies as of 07/18/2023       Reactions   Eggs-apples-oats [alitraq] Other (See Comments)   -Caused chest pains   Adhesive [tape] Other (See Comments)   After right leg fracture surgery, pt developed a large  blister where tape was applied to his right leg. OK to use paper tape.   Imdur  [isosorbide  Dinitrate] Other (See Comments)   -hallucinations   Singulair  [montelukast  Sodium] Other (See Comments)   -Hallucinations        Medication List     TAKE these medications    albuterol  108 (90 Base) MCG/ACT inhaler Commonly known as: VENTOLIN  HFA TAKE TWO PUFFS BY MOUTH AS NEEDED FOR WHEEZING EVERY 4-6 HOURS What changed: See the new instructions.   albuterol  0.63 MG/3ML nebulizer solution Commonly known as: ACCUNEB  Take 3 mLs (0.63 mg total) by nebulization every 6 (six) hours as needed for wheezing. What changed: You were already taking a medication with the same name, and this prescription was added. Make sure you understand how and when to take each.   ALPRAZolam  0.5 MG tablet Commonly known as: XANAX  Take 1 tablet (0.5 mg total) by mouth 3 (three) times daily as needed for anxiety. What changed: when to take this   bisoprolol  5 MG tablet Commonly known as: ZEBETA  Take 0.5 tablets (2.5 mg total) by mouth daily.   budesonide  0.25 MG/2ML nebulizer solution Commonly known as: PULMICORT  Take 2 mLs (0.25 mg total) by nebulization 2 (two) times daily.   cetirizine  10 MG tablet Commonly known as: ZYRTEC  Take 1 tablet (10 mg total) by mouth daily. What changed: when to take this   cholecalciferol  25 MCG (1000 UNIT) tablet Commonly known as: VITAMIN D3 Take 1,000 Units by mouth in the morning.   doxycycline  100 MG tablet Commonly known as: VIBRA -TABS Take 1 tablet (100 mg total) by mouth every 12 (twelve) hours for 5 days.   Eliquis  5 MG Tabs tablet Generic drug: apixaban  TAKE ONE TABLET (5 MG TOTAL) BY MOUTH TWO (TWO) TIMES DAILY. What changed: See the new instructions.   Farxiga  10 MG Tabs tablet Generic drug: dapagliflozin  propanediol TAKE ONE TABLET (10 MG TOTAL) BY MOUTH DAILY. What changed: See the new instructions.   feeding supplement Liqd Take 237 mLs by mouth 2  (two) times daily between meals.   FISH OIL PO Take 1,000 mg by mouth every evening.   fluticasone  50 MCG/ACT nasal spray Commonly known as: FLONASE  Place 1 spray into both nostrils daily as needed for allergies.   furosemide  20 MG tablet Commonly known as: Lasix  Take 2 tablets (40 mg total) by mouth daily. What changed: when to take this   gabapentin  100 MG capsule Commonly known as: NEURONTIN  TAKE ONE CAPSULE (100 MG TOTAL) BY MOUTH THREE (THREE) TIMES DAILY. What changed: See the new instructions.   guaiFENesin  600 MG 12 hr tablet Commonly known as: MUCINEX  Take 2 tablets (1,200 mg total) by mouth 2 (two) times daily.   lidocaine  5 % Commonly known as: LIDODERM  Place 1 patch onto the skin daily. Remove & Discard patch within 12 hours or as directed by MD   multivitamin tablet Take 1 tablet by mouth in the morning. For Men   omeprazole  40 MG capsule Commonly known as: PRILOSEC Take 1 capsule (40 mg total) by mouth 2 (two) times daily.   oxyCODONE  5 MG immediate release tablet Commonly known as: Oxy IR/ROXICODONE  Take 1 tablet (5 mg total) by mouth every 4 (four) hours  as needed for moderate pain (pain score 4-6).   OXYGEN  Inhale 3 L/min into the lungs continuous.   potassium chloride  SA 20 MEQ tablet Commonly known as: KLOR-CON  M Take 1 tablet (20 mEq total) by mouth daily.   predniSONE  20 MG tablet Commonly known as: DELTASONE  Take 3 tablets (60 mg total) by mouth daily.   rOPINIRole  1 MG tablet Commonly known as: REQUIP  TAKE ONE TABLET (1 MG TOTAL) BY MOUTH IN THE MORNING AND AT BEDTIME. What changed: See the new instructions.   rosuvastatin  20 MG tablet Commonly known as: CRESTOR  Take 1 tablet (20 mg total) by mouth daily. What changed: when to take this   sildenafil  20 MG tablet Commonly known as: REVATIO  Take 1 tablet (20 mg total) by mouth 3 (three) times daily.   Stiolto Respimat  2.5-2.5 MCG/ACT Aers Generic drug: Tiotropium  Bromide-Olodaterol Inhale 2 puffs into the lungs daily. What changed: when to take this               Durable Medical Equipment  (From admission, onward)           Start     Ordered   07/18/23 1334  For home use only DME Tub bench  Once        07/18/23 1333              Discharge Care Instructions  (From admission, onward)           Start     Ordered   07/18/23 0000  Discharge wound care:       Comments: See above   07/18/23 1406            Follow-up Information     Bruce Frisk, FNP. Schedule an appointment as soon as possible for a visit .   Specialty: Family Medicine Why: For recheck of your symptoms Contact information: [address] [place] [address] [phone]         [facility] Follow up.   Why: Registered Nurse-office to confirm visit times. Contact information: [address] Lavone Power [place]  [id number] [phone]               Discharge Exam: Filed Weights   07/16/23 1047 07/16/23 2100  Weight: 71.2 kg 67.6 kg   General : NAD  Condition at discharge: stable  The results of significant diagnostics from this hospitalization (including imaging, microbiology, ancillary and laboratory) are listed below for reference.   Imaging Studies: DG CHEST PORT 1 VIEW Result Date: 07/18/2023 CLINICAL DATA:  Hypoxia.  History of COPD. EXAM: PORTABLE CHEST 1 VIEW COMPARISON:  07/16/2023. FINDINGS: Bilateral lungs appear hyperexpanded and hyperlucent with coarse bronchovascular markings, in keeping with COPD. There are linear opacities overlying the right mid lung zone, which are similar to the prior study and may represent underlying fibrosis/scarring or atelectasis. Bilateral lungs otherwise appear clear. No dense consolidation or lung collapse. There is subtle blunting of right lateral costophrenic angle, similar to the prior study, which may represent small right pleural effusion. Left lateral  costophrenic angle is clear. No pneumothorax on either side. Stable cardio-mediastinal silhouette. No acute osseous abnormalities. The soft tissues are within normal limits. There are surgical clips in the right upper quadrant, typical of a previous cholecystectomy. IMPRESSION: No acute cardiopulmonary abnormality. COPD. Electronically Signed   By: Beula Brunswick M.D.   On: 07/18/2023 11:41   DG Chest Port 1 View Result Date: 07/16/2023 CLINICAL DATA:  Shortness of breath EXAM:  PORTABLE CHEST 1 VIEW COMPARISON:  One-view chest x-ray 07/02/2023 FINDINGS: The heart size is normal. Atherosclerotic changes are noted at the aortic arch. Chronic scarring right lung is stable. The left lung is clear. IMPRESSION: 1. No acute cardiopulmonary disease. 2. Chronic scarring right lung. Electronically Signed   By: Audree Leas M.D.   On: 07/16/2023 12:55   CT Angio Chest PE W and/or Wo Contrast Result Date: 07/02/2023 CLINICAL DATA:  Pulmonary embolism suspected, high probability. History of sub massive pulmonary embolism. Re-evaluation of prior empyema collection, previously drained. New hypoxia. EXAM: CT ANGIOGRAPHY CHEST WITH CONTRAST TECHNIQUE: Multidetector CT imaging of the chest was performed using the standard protocol during bolus administration of intravenous contrast. Multiplanar CT image reconstructions and MIPs were obtained to evaluate the vascular anatomy. RADIATION DOSE REDUCTION: This exam was performed according to the departmental dose-optimization program which includes automated exposure control, adjustment of the mA and/or kV according to patient size and/or use of iterative reconstruction technique. CONTRAST:  75mL OMNIPAQUE  IOHEXOL  350 MG/ML SOLN COMPARISON:  Radiographs 07/02/2023 and 06/04/2023. Chest CTA 05/24/2023 and 07/24/2022. FINDINGS: Cardiovascular: The pulmonary arteries are well opacified with contrast to the level of the segmental branches. 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 The right upper lobe pulmonary artery remains patent. There is also mild chronic eccentric nonocclusive thrombus within segmental branches of the left upper and lower lobes. No acute pulmonary emboli are demonstrated. Atherosclerosis of the aorta, great vessels and coronary arteries. No acute systemic arterial abnormalities are identified. The heart size is normal. Small pericardial effusion appears unchanged. Mediastinum/Nodes: There are no enlarged mediastinal, hilar or axillary lymph nodes. The thyroid  gland, trachea and esophagus demonstrate no significant findings. Lungs/Pleura: Previously demonstrated loculated right pleural effusion has decreased in volume and demonstrates less associated pleural enhancement. There is a new small dependent left pleural effusion. No pneumothorax. Interval improved aeration of the right lower lobe. There is chronic central airway thickening with scattered scarring and mild centrilobular emphysema. A 5 mm lingular nodule on image 97/6 is unchanged. No new or enlarging pulmonary nodules. Upper abdomen: No acute findings are seen in the visualized upper abdomen. Musculoskeletal/Chest wall: There is no chest wall mass or suspicious osseous finding. Mild spondylosis. Review of the MIP images confirms the above findings. IMPRESSION: 1. No evidence of acute pulmonary embolism. 2. 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 The appearance of the pulmonary arteries is similar to previous CTA's. 3. Interval decreased volume of loculated right pleural effusion with less associated pleural enhancement. New small dependent left pleural effusion. 4. Stable small lingular  nodule. 5. Aortic Atherosclerosis (ICD10-I70.0) and Emphysema (ICD10-J43.9). Electronically Signed   By: Elmon Hagedorn M.D.   On: 07/02/2023 16:26   DG Chest Port 1 View Result Date: 07/02/2023 CLINICAL DATA:  Questionable sepsis - evaluate for abnormality EXAM: PORTABLE CHEST 1 VIEW COMPARISON:  Radiographs 06/04/2023 and 05/31/2023.  CT 05/24/2023. FINDINGS: 1218 hours. Two views submitted. The heart size and mediastinal contours are stable. Previously demonstrated right pleural effusion has nearly completely resolved. Scattered scarring in both lungs appears unchanged. No confluent airspace disease or pneumothorax. The bones appear unchanged. Telemetry leads overlie the chest. IMPRESSION: Near complete resolution of previously demonstrated right pleural  effusion. No evidence of acute cardiopulmonary process. Electronically Signed   By: Elmon Hagedorn M.D.   On: 07/02/2023 13:18    Microbiology: Results for orders placed or performed during the hospital encounter of 07/16/23  SARS Coronavirus 2 by RT PCR (hospital order, performed in [facility] hospital lab) *cepheid single result test* Anterior Nasal Swab     Status: None   Collection Time: 07/16/23 10:55 AM   Specimen: Anterior Nasal Swab  Result Value Ref Range Status   SARS Coronavirus 2 by RT PCR NEGATIVE NEGATIVE Final    Comment: Performed at [facility], [address] [place], [place] [address]    Labs: CBC: Recent Labs  Lab 07/16/23 1119  WBC 7.5  NEUTROABS 6.0  HGB 11.8*  HCT 37.8*  MCV 95.9  PLT 213   Basic Metabolic Panel: Recent Labs  Lab 07/16/23 1119 07/18/23 1040  NA 138 135  K 4.2 4.1  CL 105 103  CO2 21* 23  GLUCOSE 104* 159*  BUN 16 24*  CREATININE 0.99 1.01  CALCIUM  9.3 9.5   Liver Function Tests: No results for input(s): "AST", "ALT", "ALKPHOS", "BILITOT", "PROT", "ALBUMIN " in the last 168 hours. CBG: No results for input(s): "GLUCAP" in the last 168 hours.  Discharge time spent:  greater than 30 minutes.  Signed: Danette Duos, MD [facility] 07/18/2023

## 2023-07-18 NOTE — Progress Notes (Signed)
 Palliative Medicine Progress Note   Patient Name: Bruce [name]       Date: 07/18/2023 DOB: 1945-10-17  Age: 78 y.o. MRN#: [id number] Attending Physician: Danette Duos, MD Primary Care Physician: Trenda Frisk, FNP Admit Date: 07/16/2023   HPI/Patient Profile: 78 y.o. male  with past medical history of advanced COPD, HFrEF, severe pulmonary hypertension, history of pulmonary embolism, DVT, artery disease, peripheral artery disease, paroxysmal A-fib, and history of colon cancer and prostate cancer who presented to the ED on 07/16/2023 with worsening shortness of breath and was admitted with acute on chronic respiratory failure, multifactorial due to severe cardiopulmonary disease and poor respiratory reserve.    Patient was recently admitted 4/28 through 5/1 with acute on chronic HFrEF.  Prior to that he was hospitalized 3/20 through 4/2 with acute on chronic PE and streptococcal pneumonia.   Palliative Medicine was consulted for goals of care.  Subjective: Chart reviewed. Updates received. Patient assessed at bedside. He reports much frustration with being in the hospital, and is quite adamant to go home today.  He is anxious and agitated.   Per MAR, he had a dose of oxycodone  IR 5 mg at 11:14a, and reports this has helped his pain.   He is currently on 11 L oxygen , but refuses to wear his spo2 probe. RN reports when it's on, his spo2 is in high 80s and occasionally 90-91%.   I again offered education and counseling on the philosophy and benefits of hospice care. Discussed that hospice can help with personal care, symptom management for pain and dyspnea, and help keep him out of the hospital.  With further discussion, patient is ultimately agreeable to home with hospice.  Additional  time spent coordinating care and discussing plan of care with attending MD, [facility] team, and hospice liaison.    Discussed with care team that we should try to wean his O2 closer to baseline requirements and that 88% is a reasonable goal for spo2.  RN later reports his O2 has been weaned to 5 L.    Objective:  Physical Exam Vitals reviewed.  Constitutional:      General: He is not in acute distress.    Comments: Chronically ill-appearing  Cardiovascular:     Rate and Rhythm: Normal rate.  Pulmonary:     Effort: No respiratory  distress.  Neurological:     Mental Status: He is alert and oriented to person, place, and time.  Psychiatric:        Mood and Affect: Mood is anxious.             Palliative Medicine Assessment & Plan   Assessment: Principal Problem:   Acute on chronic respiratory failure with hypoxemia (HCC) Active Problems:   Hypertension   Anxiety disorder   Dyslipidemia   GERD (gastroesophageal reflux disease)    Recommendations/Plan: Patient is agreeable to home with hospice He wants to discharge home today He will need Rx for comfort meds - oxycodone  IR and xanax  Appreciate spiritual care assistance with advanced directives  Symptom Management: Oxycodone  IR 5 mg every 4 hours as needed for pain Increased alprazolam  (Xanax ) to 1 mg 3 times a day as needed for anxiety or sleep   Primary Decision Maker: PATIENT   Code Status/Advance Care Planning: DNR/DNI  Prognosis:  < 6 months  Discharge Planning: Home with Hospice   Thank you for allowing the Palliative Medicine Team to assist in the care of this patient.   Time: 55 minutes   Wynetta Heckle, NP   Please contact Palliative Medicine Team phone at [phone] for questions and concerns.  For individual providers, please see AMION.

## 2023-07-18 NOTE — Plan of Care (Signed)
  Problem: Education: Goal: Knowledge of General Education information will improve Description: Including pain rating scale, medication(s)/side effects and non-pharmacologic comfort measures Outcome: Adequate for Discharge   Problem: Health Behavior/Discharge Planning: Goal: Ability to manage health-related needs will improve Outcome: Adequate for Discharge   Problem: Clinical Measurements: Goal: Ability to maintain clinical measurements within normal limits will improve Outcome: Adequate for Discharge Goal: Will remain free from infection Outcome: Adequate for Discharge Goal: Diagnostic test results will improve Outcome: Adequate for Discharge Goal: Respiratory complications will improve Outcome: Adequate for Discharge Goal: Cardiovascular complication will be avoided Outcome: Adequate for Discharge   Problem: Activity: Goal: Risk for activity intolerance will decrease Outcome: Adequate for Discharge   Problem: Nutrition: Goal: Adequate nutrition will be maintained Outcome: Adequate for Discharge   Problem: Coping: Goal: Level of anxiety will decrease Outcome: Adequate for Discharge   Problem: Elimination: Goal: Will not experience complications related to bowel motility Outcome: Adequate for Discharge Goal: Will not experience complications related to urinary retention Outcome: Adequate for Discharge   Problem: Pain Managment: Goal: General experience of comfort will improve and/or be controlled Outcome: Adequate for Discharge   Problem: Safety: Goal: Ability to remain free from injury will improve Outcome: Adequate for Discharge   Problem: Skin Integrity: Goal: Risk for impaired skin integrity will decrease Outcome: Adequate for Discharge   Problem: Education: Goal: Knowledge of disease or condition will improve Outcome: Adequate for Discharge Goal: Knowledge of the prescribed therapeutic regimen will improve Outcome: Adequate for Discharge Goal:  Individualized Educational Video(s) Outcome: Adequate for Discharge   Problem: Activity: Goal: Ability to tolerate increased activity will improve Outcome: Adequate for Discharge Goal: Will verbalize the importance of balancing activity with adequate rest periods Outcome: Adequate for Discharge   Problem: Respiratory: Goal: Ability to maintain a clear airway will improve Outcome: Adequate for Discharge Goal: Levels of oxygenation will improve Outcome: Adequate for Discharge Goal: Ability to maintain adequate ventilation will improve Outcome: Adequate for Discharge   Problem: Acute Rehab PT Goals(only PT should resolve) Goal: Pt Will Ambulate Outcome: Adequate for Discharge Goal: Pt Will Go Up/Down Stairs Outcome: Adequate for Discharge

## 2023-07-18 NOTE — Plan of Care (Signed)

## 2023-07-18 NOTE — Progress Notes (Signed)
 This chaplain responded to PMT NP-Julia consult for creating/updating the Pt. Advance Directive. The Pt. is awake and able to calm himself after emotionally sharing the events of the morning. The chaplain appreciates RN-Deborah's education on the absence of the Pt. inhaler.  The chaplain understands the Pt. plans to name Jenniffer Moh as his HCPOA. The Pt. and chaplain filled out the Pt. Advance Directive: HCPOA and Living Will after AD education and an opportunity for the Pt. to answer the chaplain's clarifying questions. The chaplain understands the Pt. plans to review the document with [name] and notarize after discharge.  The chaplain listened reflectively as the Pt. described his career and relationships with family. The chaplain understands the Pt[place].   The Pt. appreciated the visit as the chaplain stepped out of the room. The chaplain placed the Pt. incomplete document in the Pt. personal belonging bag.   Chaplain Kathleene Papas [phone]

## 2023-07-18 NOTE — TOC Initial Note (Addendum)
 Transition of Care [facility]) - Initial/Assessment Note    Patient Details  Name: Bruce [name] MRN: [id number] Date of Birth: 1945-06-05  Transition of Care [facility] CM/SW Contact:    Cosimo Diones, RN Phone Number: 07/18/2023, 2:35 PM  Clinical Narrative: Patient presented for shortness of breath. Patient reports that he has home oxygen  portable tank and concentrator; however, unable to name the agency that provides the oxygen . Notes in Epic reflect [employer]; however, [employer] confirmed the patient is not active. Case Manager has called [employer] to check  -awaiting call back. Palliative Care consulting and secure chat received that the patient is agreeable to Hospice. Medicare. Gov list provided and the patient chose [facility]. Referral submitted and [facility] can service. Patient will need DME shower chair for the home. Case Manager continues to follow for oxygen . Patient's roommate Tiburcio Fly [phone] to provide transportation home and patient states he told him to bring the oxygen  for transportation.        1540 07-18-23 [facility] states patient is agreeable to services; however, he declined a visit tonight. He is agreeable to have [facility] visit tomorrow am. Case Manager has called several DME agencies to see who the patient is active with. Unable to figure out. Patient unable to state the name of DME company. [employer] is willing to bring a portable tank to the hospital. No further needs identified at this time.      Expected Discharge Plan: Home w Hospice Care Barriers to Discharge: No Barriers Identified   Patient Goals and CMS Choice Patient states their goals for this hospitalization and ongoing recovery are:: patient has decided to return home with Hospice  Expected Discharge Plan and Services   Discharge Planning Services: CM Consult Post Acute Care Choice: Hospice Living arrangements for the past 2 months: Single Family  Home Expected Discharge Date: 07/18/23                HH Arranged: RN HH Agency: [facility] Date HH Agency Contacted: 07/18/23 Time HH Agency Contacted: 1434 Representative spoke with at [facility] Agency: Cheri  Prior Living Arrangements/Services Living arrangements for the past 2 months: Single Family Home Lives with:: Roommate Patient language and need for interpreter reviewed:: Yes Do you feel safe going back to the place where you live?: Yes      Need for Family Participation in Patient Care: Yes (Comment) (roommate.) Care giver support system in place?: Yes (comment) Current home services: DME (oxygen , cane,) Criminal Activity/Legal Involvement Pertinent to Current Situation/Hospitalization: No - Comment as needed  Activities of Daily Living   ADL Screening (condition at time of admission) Independently performs ADLs?: Yes (appropriate for developmental age) Is the patient deaf or have difficulty hearing?: No Does the patient have difficulty seeing, even when wearing glasses/contacts?: No Does the patient have difficulty concentrating, remembering, or making decisions?: No  Permission Sought/Granted Permission sought to share information with : Case Manager, Family Supports, Oceanographer granted to share information with : Yes, Verbal Permission Granted     Permission granted to share info w AGENCY: [facility]        Emotional Assessment Appearance:: Appears stated age Attitude/Demeanor/Rapport: Unable to Assess Affect (typically observed): Unable to Assess Orientation: : Oriented to Self, Oriented to Place, Oriented to  Time Alcohol / Substance Use: Not Applicable Psych Involvement: No (comment)  Admission diagnosis:  SOB (shortness of breath) [R06.02] Acute respiratory  failure with hypoxia (HCC) [J96.01] Acute on chronic respiratory failure with hypoxemia (HCC) [J96.21] Patient Active Problem List   Diagnosis Date  Noted   Acute on chronic respiratory failure with hypoxemia (HCC) 07/16/2023   Empyema lung (HCC) 07/04/2023   Severe sepsis (HCC) 07/03/2023   Empyema (HCC) 07/02/2023   Medication management 07/02/2023   Hypoxia 07/02/2023   Hypokalemia 07/02/2023   Acute on chronic systolic CHF (congestive heart failure) (HCC) 07/02/2023   History of CAD (coronary artery disease) 07/02/2023   COPD (chronic obstructive pulmonary disease) (HCC) 07/02/2023   History of pulmonary embolism 07/02/2023   History of DVT (deep vein thrombosis) 07/02/2023   History of cigarette smoking 07/02/2023   History of lung cancer 07/02/2023   History of colon cancer 07/02/2023   History of prostate cancer 07/02/2023   COPD with acute exacerbation (HCC) 07/02/2023   Physical deconditioning 06/14/2023   NICM (nonischemic cardiomyopathy) (HCC) 05/26/2023   Pulmonary arterial hypertension (HCC) 05/26/2023   Noncompliance 05/26/2023   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   Pleural effusion, right 05/24/2023   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   Pulmonary embolism on right (HCC) 05/24/2023   Neuropathy 05/19/2023   Acute on chronic respiratory failure with hypoxia (HCC) 04/19/2023   Acute decompensated heart failure (HCC) 04/18/2023   Prolonged Q-T interval on ECG 04/18/2023   Influenza A 04/18/2023   Cor pulmonale (chronic) (HCC) 04/18/2023   Peripheral artery disease (HCC) 12/18/2022   B12 deficiency 12/18/2022   MDD (major depressive disorder), recurrent episode, moderate (HCC) 09/01/2022   Pulmonary hypertension (HCC) 04/14/2022   Acute on chronic combined systolic and diastolic CHF (congestive heart failure) (HCC) 04/13/2022   Pulmonary embolism (HCC) 04/09/2022   Paroxysmal atrial fibrillation (HCC)    NSTEMI (non-ST elevated myocardial infarction) (HCC) 10/10/2018   Syncope 04/28/2018   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   Limb ischemia 11/18/2013   Acute blood loss  anemia 10/08/2013   Anxiety disorder 10/08/2013   Dyslipidemia 10/08/2013   GERD (gastroesophageal reflux disease) 10/08/2013   Anticoagulated 10/08/2013   Hypertension    CAD (coronary artery disease) 08/16/2010   Acute exacerbation of chronic obstructive pulmonary disease (COPD) (HCC) 08/16/2010   DVT of leg (deep venous thrombosis) (HCC) 08/16/2010   PCP:  Trenda Frisk, FNP Pharmacy:   [facility] - [place], [place] - [address] [place] [address] Phone: [phone] Fax: [phone]     Social Drivers of Health (SDOH) Social History: SDOH Screenings   Food Insecurity: Food Insecurity Present (07/16/2023)  Housing: Low Risk  (07/16/2023)  Recent Concern: Housing - High Risk (07/03/2023)  Transportation Needs: No Transportation Needs (07/16/2023)  Recent Concern: Transportation Needs - Unmet Transportation Needs (05/25/2023)  Utilities: Not At Risk (07/16/2023)  Depression (PHQ2-9): Medium Risk (07/09/2023)  Financial Resource Strain: High Risk (04/12/2022)  Physical Activity: Inactive (02/05/2017)  Social Connections: Unknown (07/16/2023)  Stress: Stress Concern Present (02/05/2017)  Tobacco Use: Medium Risk (07/16/2023)   SDOH Interventions:     Readmission Risk Interventions    05/28/2023    5:06 PM  Readmission Risk Prevention Plan  Transportation Screening Complete  PCP or Specialist Appt within 5-7 Days Complete  Home Care Screening Complete  Medication Review (RN CM) Complete

## 2023-07-18 NOTE — Progress Notes (Signed)
 Heart Failure Navigator Progress Note  Assessed for Heart & Vascular TOC clinic readiness.  Patient does not meet criteria due to Advanced Heart Failure Team patient of Dr. Gala Romney. .   Navigator will sign off at this time.   Rhae Hammock, BSN, Scientist, clinical (histocompatibility and immunogenetics) Only

## 2023-07-19 ENCOUNTER — Telehealth (HOSPITAL_COMMUNITY): Payer: Self-pay

## 2023-07-19 NOTE — Telephone Encounter (Signed)
 Attempted to reach Mr. Lardy to follow up with a paramedicine home visit but no answer on his phone or his roommate Wes. I am out of office on Fridays. I will follow up on Monday. I will also attempt to reach [facility] SW and RN to follow up on transition of care.    Roberts Ching, EMT-Paramedic [phone] 07/19/2023

## 2023-07-23 ENCOUNTER — Other Ambulatory Visit (HOSPITAL_COMMUNITY): Payer: Self-pay

## 2023-07-23 NOTE — Progress Notes (Signed)
 Paramedicine Encounter    Patient ID: Bruce [name], male    DOB: 07-09-1945, 78 y.o.   MRN: [id number]   Complaints- shortness of breath on exertion., left hip and leg pain.   Assessment- CAOX4, warm and dry sitting in the living room of his roomates home. He is wearing 4LPM of oxygen  via [place]. Lower leg edema noted bilaterally. Lungs clear. Vitals within normal range. Weight up 5 lbs from last week despite hospital admission. Has not taken any meds since last week due to not knowing what to take despite education on meds and discharge summary.   Compliance with meds- no meds since Friday.   Pill box filled- for two weeks   Refills needed- albuterol  inhaler, mucinex .   Meds changes since last visit- mucinex , prednisone , antibiotic.     Social changes- denied hospice because he was unwilling to switch to [facility] for oxygen  supplies [facility]. Needs shower chair and refills for oxycodone  and xanax .    VISIT SUMMARY- Arrived for home visit for [facility] who reports to be feeling short of breath on exertion, coughing and having leg and hip pain. These are not new symptoms for him. He has been without meds since Friday upon d/c. I reviewed meds with him and filled pill box for two weeks. Assessment and vitals as noted. He has swelling in legs bilaterally. Lungs clear. I gave him his morning meds during visit. I reviewed upcoming appointments with him. I also reviewed HF education and the need to comply with diet, fluid intake and medication regimen to improve. He verbalized understanding. He will follow up in HF clinic on Thursday at [facility]. I will follow up if there are any med changes. Home visit complete. I will see Bruce [name] in two weeks.   BP 130/80   Pulse 96   Resp (!) 22   Wt 161 lb (73 kg)   SpO2 (!) 85% Comment: 4LPM  BMI 21.24 kg/m  Weight yesterday--didn't weigh  Last visit weight-- 157lbs      ACTION: Home visit completed     Patient Care Team: Trenda Frisk, FNP as PCP - General (Family Medicine) Cherrie Cornwall, MD as PCP - Cardiology (Cardiology) Claudene Crystal, RN as [facility] Care Management  Patient Active Problem List   Diagnosis Date Noted   Acute on chronic respiratory failure with hypoxemia (HCC) 07/16/2023   Empyema lung (HCC) 07/04/2023   Severe sepsis (HCC) 07/03/2023   Empyema (HCC) 07/02/2023   Medication management 07/02/2023   Hypoxia 07/02/2023   Hypokalemia 07/02/2023   Acute on chronic systolic CHF (congestive heart failure) (HCC) 07/02/2023   History of CAD (coronary artery disease) 07/02/2023   COPD (chronic obstructive pulmonary disease) (HCC) 07/02/2023   History of pulmonary embolism 07/02/2023   History of DVT (deep vein thrombosis) 07/02/2023   History of cigarette smoking 07/02/2023   History of lung cancer 07/02/2023   History of colon cancer 07/02/2023   History of prostate cancer 07/02/2023   COPD with acute exacerbation (HCC) 07/02/2023   Physical deconditioning 06/14/2023   NICM (nonischemic cardiomyopathy) (HCC) 05/26/2023   Pulmonary arterial hypertension (HCC) 05/26/2023   Noncompliance 05/26/2023   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   Pleural effusion, right 05/24/2023   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   Pulmonary embolism on right (HCC) 05/24/2023   Neuropathy 05/19/2023   Acute on chronic respiratory failure with hypoxia (HCC) 04/19/2023   Acute decompensated heart failure (HCC) 04/18/2023  Prolonged Q-T interval on ECG 04/18/2023   Influenza A 04/18/2023   Cor pulmonale (chronic) (HCC) 04/18/2023   Peripheral artery disease (HCC) 12/18/2022   B12 deficiency 12/18/2022   MDD (major depressive disorder), recurrent episode, moderate (HCC) 09/01/2022   Pulmonary hypertension (HCC) 04/14/2022   Acute on chronic combined systolic and diastolic CHF (congestive heart failure) (HCC) 04/13/2022   Pulmonary embolism (HCC) 04/09/2022    Paroxysmal atrial fibrillation (HCC)    NSTEMI (non-ST elevated myocardial infarction) (HCC) 10/10/2018   Syncope 04/28/2018   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   Limb ischemia 11/18/2013   Acute blood loss anemia 10/08/2013   Anxiety disorder 10/08/2013   Dyslipidemia 10/08/2013   GERD (gastroesophageal reflux disease) 10/08/2013   Anticoagulated 10/08/2013   Hypertension    CAD (coronary artery disease) 08/16/2010   Acute exacerbation of chronic obstructive pulmonary disease (COPD) (HCC) 08/16/2010   DVT of leg (deep venous thrombosis) (HCC) 08/16/2010    Current Outpatient Medications:    albuterol  (ACCUNEB ) 0.63 MG/3ML nebulizer solution, Take 3 mLs (0.63 mg total) by nebulization every 6 (six) hours as needed for wheezing., Disp: 75 mL, Rfl: 12   albuterol  (VENTOLIN  HFA) 108 (90 Base) MCG/ACT inhaler, TAKE TWO PUFFS BY MOUTH AS NEEDED FOR WHEEZING EVERY 4-6 HOURS (Patient taking differently: Inhale 2 puffs into the lungs every 4 (four) hours as needed for wheezing or shortness of breath.), Disp: 8.5 g, Rfl: 5   ALPRAZolam  (XANAX ) 0.5 MG tablet, Take 1 tablet (0.5 mg total) by mouth 3 (three) times daily as needed for anxiety., Disp: 10 tablet, Rfl: 0   apixaban  (ELIQUIS ) 5 MG TABS tablet, TAKE ONE TABLET (5 MG TOTAL) BY MOUTH TWO (TWO) TIMES DAILY. (Patient taking differently: Take 5 mg by mouth 2 (two) times daily.), Disp: 60 tablet, Rfl: 11   bisoprolol  (ZEBETA ) 5 MG tablet, Take 0.5 tablets (2.5 mg total) by mouth daily., Disp: 45 tablet, Rfl: 3   budesonide  (PULMICORT ) 0.25 MG/2ML nebulizer solution, Take 2 mLs (0.25 mg total) by nebulization 2 (two) times daily., Disp: 60 mL, Rfl: 12   cetirizine  (ZYRTEC ) 10 MG tablet, Take 1 tablet (10 mg total) by mouth daily. (Patient taking differently: Take 10 mg by mouth in the morning.), Disp: 30 tablet, Rfl: 11   cholecalciferol  (VITAMIN D3) 25 MCG (1000 UNIT) tablet, Take 1,000 Units by mouth in the morning., Disp: , Rfl:    doxycycline   (VIBRA -TABS) 100 MG tablet, Take 1 tablet (100 mg total) by mouth every 12 (twelve) hours for 5 days., Disp: 10 tablet, Rfl: 0   FARXIGA  10 MG TABS tablet, TAKE ONE TABLET (10 MG TOTAL) BY MOUTH DAILY. (Patient taking differently: Take 10 mg by mouth in the morning.), Disp: 90 tablet, Rfl: 1   feeding supplement (ENSURE ENLIVE / ENSURE PLUS) LIQD, Take 237 mLs by mouth 2 (two) times daily between meals., Disp: 10000 mL, Rfl: 0   fluticasone  (FLONASE ) 50 MCG/ACT nasal spray, Place 1 spray into both nostrils daily as needed for allergies., Disp: , Rfl:    furosemide  (LASIX ) 20 MG tablet, Take 2 tablets (40 mg total) by mouth daily. (Patient taking differently: Take 40 mg by mouth in the morning.), Disp: 60 tablet, Rfl: 11   gabapentin  (NEURONTIN ) 100 MG capsule, TAKE ONE CAPSULE (100 MG TOTAL) BY MOUTH THREE (THREE) TIMES DAILY. (Patient taking differently: Take 100 mg by mouth 3 (three) times daily.), Disp: 90 capsule, Rfl: 1   guaiFENesin  (MUCINEX ) 600 MG 12 hr tablet, Take 2 tablets (1,200 mg total)  by mouth 2 (two) times daily., Disp: 60 tablet, Rfl: 0   lidocaine  (LIDODERM ) 5 %, Place 1 patch onto the skin daily. Remove & Discard patch within 12 hours or as directed by MD, Disp: 30 patch, Rfl: 11   Multiple Vitamin (MULTIVITAMIN) tablet, Take 1 tablet by mouth in the morning. For Men, Disp: , Rfl:    Omega-3 Fatty Acids (FISH OIL PO), Take 1,000 mg by mouth every evening., Disp: , Rfl:    omeprazole  (PRILOSEC) 40 MG capsule, Take 1 capsule (40 mg total) by mouth 2 (two) times daily., Disp: 180 capsule, Rfl: 1   oxyCODONE  (OXY IR/ROXICODONE ) 5 MG immediate release tablet, Take 1 tablet (5 mg total) by mouth every 4 (four) hours as needed for moderate pain (pain score 4-6)., Disp: 30 tablet, Rfl: 0   OXYGEN , Inhale 3 L/min into the lungs continuous., Disp: , Rfl:    potassium chloride  SA (KLOR-CON  M) 20 MEQ tablet, Take 1 tablet (20 mEq total) by mouth daily., Disp: 30 tablet, Rfl: 0   predniSONE   (DELTASONE ) 20 MG tablet, Take 3 tablets (60 mg total) by mouth daily., Disp: 12 tablet, Rfl: 0   rOPINIRole  (REQUIP ) 1 MG tablet, TAKE ONE TABLET (1 MG TOTAL) BY MOUTH IN THE MORNING AND AT BEDTIME. (Patient taking differently: Take 1 mg by mouth.), Disp: 60 tablet, Rfl: 3   rosuvastatin  (CRESTOR ) 20 MG tablet, Take 1 tablet (20 mg total) by mouth daily. (Patient taking differently: Take 20 mg by mouth every evening.), Disp: 30 tablet, Rfl: 2   sildenafil  (REVATIO ) 20 MG tablet, Take 1 tablet (20 mg total) by mouth 3 (three) times daily., Disp: 90 tablet, Rfl: 5   Tiotropium Bromide-Olodaterol (STIOLTO RESPIMAT ) 2.5-2.5 MCG/ACT AERS, Inhale 2 puffs into the lungs daily. (Patient taking differently: Inhale 2 puffs into the lungs in the morning.), Disp: 4 g, Rfl: 12 Allergies  Allergen Reactions   Eggs-Apples-Oats [Alitraq] Other (See Comments)    -Caused chest pains   Adhesive [Tape] Other (See Comments)    After right leg fracture surgery, pt developed a large blister where tape was applied to his right leg. OK to use paper tape.   Imdur  [Isosorbide  Dinitrate] Other (See Comments)    -hallucinations   Singulair  [Montelukast  Sodium] Other (See Comments)    -Hallucinations      Social History   Socioeconomic History   Marital status: Single    Spouse name: Not on file   Number of children: 2   Years of education: Not on file   Highest education level: 6th grade  Occupational History   Not on file  Tobacco Use   Smoking status: Former    Current packs/day: 0.00    Average packs/day: 0.5 packs/day for 52.0 years (26.0 ttl pk-yrs)    Types: Cigarettes    Start date: 07/05/1970    Quit date: 07/05/2022    Years since quitting: 1.0   Smokeless tobacco: Never  Vaping Use   Vaping status: Never Used  Substance and Sexual Activity   Alcohol use: No    Alcohol/week: 0.0 standard drinks of alcohol    Comment: Stopped 2009   Drug use: No   Sexual activity: Not Currently  Other Topics  Concern   Not on file  Social History Narrative   ** Merged History Encounter **       Social Drivers of Health   Financial Resource Strain: High Risk (04/12/2022)   Overall Financial Resource Strain (CARDIA)    Difficulty of  Paying Living Expenses: Hard  Food Insecurity: Food Insecurity Present (07/16/2023)   Hunger Vital Sign    Worried About Running Out of Food in the Last Year: Sometimes true    Ran Out of Food in the Last Year: Sometimes true  Transportation Needs: No Transportation Needs (07/16/2023)   PRAPARE - Administrator, Civil Service (Medical): No    Lack of Transportation (Non-Medical): No  Recent Concern: Transportation Needs - Unmet Transportation Needs (05/25/2023)   PRAPARE - Administrator, Civil Service (Medical): Yes    Lack of Transportation (Non-Medical): No  Physical Activity: Inactive (02/05/2017)   Exercise Vital Sign    Days of Exercise per Week: 0 days    Minutes of Exercise per Session: 0 min  Stress: Stress Concern Present (02/05/2017)   [employer] of Occupational Health - Occupational Stress Questionnaire    Feeling of Stress : Rather much  Social Connections: Unknown (07/16/2023)   Social Connection and Isolation Panel [NHANES]    Frequency of Communication with Friends and Family: Twice a week    Frequency of Social Gatherings with Friends and Family: Once a week    Attends Religious Services: Patient declined    Database administrator or Organizations: No    Attends Banker Meetings: Never    Marital Status: Divorced  Catering manager Violence: Not At Risk (07/16/2023)   Humiliation, Afraid, Rape, and Kick questionnaire    Fear of Current or Ex-Partner: No    Emotionally Abused: No    Physically Abused: No    Sexually Abused: No    Physical Exam      Future Appointments  Date Time Provider Department Center  07/26/2023  9:00 AM Charlette Console, FNP [facility] None  08/13/2023  1:15 PM Trenda Frisk, FNP [facility] None

## 2023-07-25 ENCOUNTER — Telehealth: Payer: Self-pay | Admitting: Family

## 2023-07-25 NOTE — Telephone Encounter (Signed)
 Patient's roommate, Slater Duncan, left VM requesting a refill of oxycodone  for the patient. Wes is not on the patient's emergency contacts or HIPAA form. I spoke with Mylinda Asa and she does not feel comfortable with writing a Rx for oxycodone  for him. The Rx he currently has came from a hospitalist when he was discharged from the hospital last week.  I called Mr. Million back myself and spoke with him and let him know that I cannot speak with Wes due to him not being on the HIPAA form. I also informed him that Mylinda Asa does not feel comfortable with writing him oxycodone . Patient verbalized understanding and asked what else Mylinda Asa would recommend him do because he is having a lot of pain in his tail bone where he fell 2 weeks ago. States he cannot sit on his buttocks, he has to sit on his side/hip. He says the oxycodone  is really helping him but he only has 4 pills left.  Please advise on what to do.

## 2023-07-25 NOTE — Telephone Encounter (Signed)
 Called to confirm/remind patient of [facility] appointment [facility] on 07[facility].   Appointment:   [x] Confirmed  [] Left mess   [] No answer/No voice mail  [] VM Full/unable to leave message  [] Phone not in service  Patient reminded to bring all medications and/or complete list.  Confirmed patient has transportation. Gave directions, instructed to utilize valet parking.

## 2023-07-25 NOTE — Progress Notes (Signed)
 Advanced Heart Failure Clinic Note     PCP: Evander Hills, FNP (last seen 05/25) Radiation Oncology: Dr Jacalyn Martin HF Cardiologist: Dr Julane Ny    Chief Complaint: shortness of breath   HPI:  Mr Rindfleisch is a 78 y.o. male with COPD, ongoing tobacco use, CAD, HFrEF, colon CA previous lung CA, prostate cancer, PE, DVT, pHTN, empyema, PAF and noncompliance.   Admitted 04/09/22 with submassive PE requiring thrombectomy but clot noted to be A/C (concern for CTEPH). Missed several doses of Xarelto  prior to admit. Also had a/c LE DVT.  EF found to be down to 20-25%. RV severely reduced. Cath minimal CAD, moderate to severe PAH with evidence of cor pulmonale (LPAP > RPAP - checked multiple times). Discharged 04/18/22 to SNF. Discharge to home 05/05/22.   Dr. Julane Ny saw him in 4/24 and POCUS showed severe RV dysfunction and LVEF 40% (back to baseline). Suspicion was for severe underlying PAH/cor pulmonale due to end-stage COPD. PFTs and VQ ordered  VQ 5/24: Multiple peripheral perfusion defects in both lungs, indeterminate for recurrent acute pulmonary embolism -> CT scan recommended.  CT chest 5/24: Severe COPD. The component of thrombus in the right main PA has essentially resolved since the prior CTA with a small amount of anterior chronic mural thrombus remaining. Distally, occlusive chronic thrombus remains in the interlobar PA extending into RLL and RML pulmonary arteries. Does not have the appearance of acute thrombus and appears to be chronic obstructive thrombus. At the time of thrombectomy, there was clearly a component of significant chronic thrombus in the interlobar artery  Echo 01/15/23: EF 40-45%, flattened septum with RV failure  Readmitted in 02/25 with acute on chronic respiratory failure and volume overload. He was + for flu A. Diuresis limited by hypotension.  Admitted 3/25 with A/C PE and A/C HFrEF. Pulmonology consulted. Pleural fluid labs consistent with empyema and culture  significant for streptococcus pneumoniae. Started on abx. Echo with EF 25-30%, mild RV dysfunction. Cardiology consulted. Diuresed during admission and started on Eliquis . Palliative care consulted, refused hospice and palliative care services as well as HH.   Admitted 07/02/23 after being seen in ID clinic for follow-up 4/28 was noted to be dyspneic, and hypoxic. Turns out his battery on his home O2 had died and eventually sent to the ED for hypoxia from equipment failure. On his initial physical examination  his blood pressure was 120/77, HR 85, RR 15 and 02 saturation 99% on supplemental 02 per [place]. Lungs with bilateral wheezing with no rhonchi or rales. Chest radiograph with right rotation with no cardiomegaly of infiltrates. Small right pleural effusion. CT chest with no evidence of acute pulmonary embolism. 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 IV diuresed and given antibiotics.   Admitted 07/16/23 with shortness of breath acute on chronic, increased work of breathing, found 83% on 3 L oxygen  at home. 9 pound weight gain over previous week. IV diuresed and IV solu-medrol  given. Discharged home on hospice care.   Seen by [facility] paramedicine 07/23/23 and found to have weight gain & continued SOB. Patient had not taken any meds since recent discharge on 07/18/23 because he was unsure of what to take. Morning meds given and 2 week pill box fixed.   He presents today, with a caregiver, for a HF follow-up visit with a chief complaint of moderate SOB. Has associated  fatigue, rare chest pain, occasional palpitations, frequent dizziness, pedal edema. Decreased appetite due to decreased taste. Denies abdominal distention. Wearing oxygen  at 4-4.5 liters at rest. Turns it up to 5- 5.5 liters with exertion. Has not taken any of his medications yet  today because he hasn't eaten breakfast yet.   Says that someone from hospice came and talked to him but he thought he was going to have to go to a facility to receive care. He also didn't want to change oxygen  companies so he declined their services at this time.   ROS: All systems negative except as listed in HPI, PMH and Problem List.  SH:  Social History   Socioeconomic History   Marital status: Single    Spouse name: Not on file   Number of children: 2   Years of education: Not on file   Highest education level: 6th grade  Occupational History   Not on file  Tobacco Use   Smoking status: Former    Current packs/day: 0.00    Average packs/day: 0.5 packs/day for 52.0 years (26.0 ttl pk-yrs)    Types: Cigarettes    Start date: 07/05/1970    Quit date: 07/05/2022    Years since quitting: 1.0   Smokeless tobacco: Never  Vaping Use   Vaping status: Never Used  Substance and Sexual Activity   Alcohol use: No    Alcohol/week: 0.0 standard drinks of alcohol    Comment: Stopped 2009   Drug use: No   Sexual activity: Not Currently  Other Topics Concern   Not on file  Social History Narrative   ** Merged History Encounter **       Social Drivers of Health   Financial Resource Strain: High Risk (04/12/2022)   Overall Financial Resource Strain (CARDIA)    Difficulty of Paying Living Expenses: Hard  Food Insecurity: Food Insecurity Present (07/16/2023)   Hunger Vital Sign    Worried About Running Out of Food in the Last Year: Sometimes true    Ran Out of Food in the Last Year: Sometimes true  Transportation Needs: No Transportation Needs (07/16/2023)   PRAPARE - Administrator, Civil Service (Medical): No    Lack of Transportation (Non-Medical): No  Recent Concern: Transportation Needs - Unmet Transportation Needs (05/25/2023)   PRAPARE - Administrator, Civil Service (Medical): Yes    Lack of Transportation (Non-Medical): No  Physical Activity: Inactive  (02/05/2017)   Exercise Vital Sign    Days of Exercise per Week: 0 days    Minutes of Exercise per Session: 0 min  Stress: Stress Concern Present (02/05/2017)   [employer] of Occupational Health - Occupational Stress Questionnaire    Feeling of Stress : Rather much  Social Connections: Unknown (07/16/2023)   Social Connection and Isolation Panel [NHANES]    Frequency of Communication with Friends and Family: Twice a week    Frequency of Social Gatherings with Friends and Family: Once a week    Attends Religious Services: Patient declined    Database administrator or Organizations: No    Attends Banker Meetings: Never    Marital Status: Divorced  Catering manager Violence: Not At Risk (07/16/2023)   Humiliation, Afraid, Rape, and Kick questionnaire    Fear of Current or Ex-Partner: No    Emotionally Abused: No    Physically Abused: No    Sexually Abused: No    FH:  Family History  Problem Relation Age of  Onset   Hypertension Mother    Prostate cancer Brother    Mental illness Neg Hx     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 01/01/2015   Complication of anesthesia    wake up with a head ache   COPD (chronic obstructive pulmonary disease) (HCC)    Coronary artery disease    Edema 08/16/2010   GERD (gastroesophageal reflux disease)    Headache    related to sinus congestion   History of blood transfusion    History of kidney stones    History of MI (myocardial infarction) 10/08/2013   Hypertension    Hypokalemia 04/09/2022   Intracranial  bleed (HCC) 12/31/2018   Lactic acidosis 04/09/2022   Leg cramps 08/16/2010   Malignant neoplasm of prostate (HCC) 07/13/2016   Multiple closed facial bone fractures (HCC)    Myocardial infarction [facility])    '09 AND '12   Peripheral vascular disease (HCC)    Shortness of breath    With exertion .   Tibia/fibula fracture 10/06/2013   Tibial plateau fracture 12/29/2014   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HCC) 10/08/2013   UTI (urinary tract infection)    frequent UTI   Visual hallucination 03/18/2015    Current Outpatient Medications  Medication Sig Dispense Refill   albuterol  (ACCUNEB ) 0.63 MG/3ML nebulizer solution Take 3 mLs (0.63 mg total) by nebulization every 6 (six) hours as needed for wheezing. 75 mL 12   albuterol  (VENTOLIN  HFA) 108 (90 Base) MCG/ACT inhaler TAKE TWO PUFFS BY MOUTH AS NEEDED FOR WHEEZING EVERY 4-6 HOURS (Patient taking differently: Inhale 2 puffs into the lungs every 4 (four) hours as needed for wheezing or shortness of breath.) 8.5 g 5   ALPRAZolam  (XANAX ) 0.5 MG tablet Take 1 tablet (0.5 mg total) by mouth 3 (three) times daily as needed for anxiety. 10 tablet 0   apixaban  (ELIQUIS ) 5 MG TABS tablet TAKE ONE TABLET (5 MG TOTAL) BY MOUTH TWO (TWO) TIMES DAILY. (Patient taking differently: Take 5 mg by mouth 2 (two) times daily.) 60 tablet 11   bisoprolol  (ZEBETA ) 5 MG tablet Take 0.5 tablets (2.5 mg total) by mouth daily. 45 tablet 3   budesonide  (PULMICORT ) 0.25 MG/2ML nebulizer solution Take 2 mLs (0.25 mg total) by nebulization 2 (two) times daily. 60 mL 12   cetirizine  (ZYRTEC ) 10 MG tablet Take 1 tablet (10 mg total) by mouth daily. (Patient taking differently: Take 10 mg by mouth in the morning.) 30 tablet 11   cholecalciferol  (VITAMIN D3) 25 MCG (1000 UNIT) tablet Take 1,000 Units by mouth in the morning.     FARXIGA  10 MG TABS tablet TAKE ONE TABLET (10 MG TOTAL) BY MOUTH DAILY. (Patient taking differently: Take 10 mg by mouth in the morning.) 90 tablet 1    feeding supplement (ENSURE ENLIVE / ENSURE PLUS) LIQD Take 237 mLs by mouth 2 (two) times daily between meals. 10000 mL 0   fluticasone  (FLONASE ) 50 MCG/ACT nasal spray Place 1 spray into both nostrils daily as  needed for allergies.     furosemide  (LASIX ) 20 MG tablet Take 2 tablets (40 mg total) by mouth daily. (Patient taking differently: Take 40 mg by mouth in the morning.) 60 tablet 11   gabapentin  (NEURONTIN ) 100 MG capsule TAKE ONE CAPSULE (100 MG TOTAL) BY MOUTH THREE (THREE) TIMES DAILY. (Patient taking differently: Take 100 mg by mouth 3 (three) times daily.) 90 capsule 1   guaiFENesin  (MUCINEX ) 600 MG 12 hr tablet Take 2 tablets (1,200 mg total) by mouth 2 (two) times daily. 60 tablet 0   lidocaine  (LIDODERM ) 5 % Place 1 patch onto the skin daily. Remove & Discard patch within 12 hours or as directed by MD 30 patch 11   Multiple Vitamin (MULTIVITAMIN) tablet Take 1 tablet by mouth in the morning. For Men     Omega-3 Fatty Acids (FISH OIL PO) Take 1,000 mg by mouth every evening.     omeprazole  (PRILOSEC) 40 MG capsule Take 1 capsule (40 mg total) by mouth 2 (two) times daily. 180 capsule 1   oxyCODONE  (OXY IR/ROXICODONE ) 5 MG immediate release tablet Take 1 tablet (5 mg total) by mouth every 4 (four) hours as needed for moderate pain (pain score 4-6). 30 tablet 0   OXYGEN  Inhale 3 L/min into the lungs continuous.     potassium chloride  SA (KLOR-CON  M) 20 MEQ tablet Take 1 tablet (20 mEq total) by mouth daily. 30 tablet 0   predniSONE  (DELTASONE ) 20 MG tablet Take 3 tablets (60 mg total) by mouth daily. 12 tablet 0   rOPINIRole  (REQUIP ) 1 MG tablet TAKE ONE TABLET (1 MG TOTAL) BY MOUTH IN THE MORNING AND AT BEDTIME. (Patient taking differently: Take 1 mg by mouth.) 60 tablet 3   rosuvastatin  (CRESTOR ) 20 MG tablet Take 1 tablet (20 mg total) by mouth daily. (Patient taking differently: Take 20 mg by mouth every evening.) 30 tablet 2   sildenafil  (REVATIO ) 20 MG tablet Take 1 tablet (20 mg  total) by mouth 3 (three) times daily. 90 tablet 5   Tiotropium Bromide-Olodaterol (STIOLTO RESPIMAT ) 2.5-2.5 MCG/ACT AERS Inhale 2 puffs into the lungs daily. (Patient taking differently: Inhale 2 puffs into the lungs in the morning.) 4 g 12   No current facility-administered medications for this visit.   Vitals:   07/26/23 1000 07/26/23 1001 07/26/23 1002 07/26/23 1045  BP: 135/84     Pulse: (!) 107 (!) 102 (!) 101   SpO2: (!) 70% (!) 79% (!) 83% (!) 88%  Weight: 160 lb (72.6 kg)      Wt Readings from Last 3 Encounters:  07/26/23 160 lb (72.6 kg)  07/23/23 161 lb (73 kg)  07/16/23 149 lb (67.6 kg)   Lab Results  Component Value Date   CREATININE 1.01 07/18/2023   CREATININE 0.99 07/16/2023   CREATININE 1.19 07/05/2023    PHYSICAL EXAM:  General: elderly, frail appearing.  Arrived in [facility] HEENT: +[place] Neck: supple, no JVD Cor: Regular rhythm, rate. No rubs, gallops or murmurs Lungs: clear Abdomen: soft, nontender, nondistended. Extremities: no cyanosis, clubbing, rash, 2+ pitting edema right lower leg, 1+ pitting left lower leg Neuro: alert & oriented X 3. Moves all 4 extremities w/o difficulty. Affect pleasant   EKG: not done   Reds: unable to obtain reading due to low quality   ASSESSMENT & PLAN:  1. Chronic Hypoxic Respiratory Failure - COPD at b/l + submassive PE.   - Chronically on 4-5L O2 [place] - Pleural fluid labs consistent with empyema and culture  significant for streptococcus pneumoniae. Has completed antibiotic treatment - to make f/u appt w/ pulmonology; they will stop at their office to make appt  2. 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  - VQ 5/24: Multiple peripheral perfusion defects in both lungs, indeterminate for recurrent acute pulmonary embolism -> CT scan recommended - CT chest  5/24: Severe COPD. The component of thrombus in the right main PA has essentially resolved since the prior CTA with a small amount of anterior chronic mural thrombus remaining. Distally, occlusive chronic thrombus remains in the interlobar PA extending into RLL and RML pulmonary arteries - No role for thrombectomy - continue sildenafil  20 TID; currently has pill box filled by paramedicine; admits that he sometimes forgets to take meds  - BMET 07/18/23 reviewed: sodium 135, potassium 4.1, creatinine 1.01 & GFR >60 - BMET today   3. PAF  - off amiodarone . Not ideal long-term with COPD.  - TSH consistently around 6, last checked 12/24.  - TSH/ thyroid  panel today - Continue Eliquis  5 mg bid. occasionally misses doses   4. Chronic Biventricular HFrEF  - Echo (2/24): EF down 20-25% with D shaped septum RV Failure, EF previously 40-45%.  - R/LHC (2/24): c/w severe NICM. Minimal CAD, moderate to severe PAH with evidence of cor pulmonale - POCUS (06/21/22): EF 40%, RV remains dilated and reduced.  - Echo 11/24: EF 40-45%, RV moderately reduced, septum flattened - Echo 05/25/23: EF 25-30%, RV mildly reduced, G1DD, septum flattened - NYHA IIIb, limited primarily by lung disease and deconditioning.  - fluid up with pedal edema; just resumed meds 3 days ago via paramedicine (had not taken any meds from discharge 5/14-5/19 due to his confusion about his meds) - Continue bisoprolol  2.5 mg daily due to lung disease - Continue Farxiga  10 mg daily. - Continue lasix  40 mg daily / potassium 20meq daily (offered to send in 10meq tablets to make it easier to swallow but he declined) - Off losartan  d/t hypotension - Off digoxin  with noncompliance. - Off spiro due to previously elevated K.  - Continue with Paramedicine (last seen 07/23/23). Appreciate assistance. - Continue to follow with [facility]  - Appears end-stage.   5. Tobacco abuse - States he has not smoked for 5-6 months now  6. Prostate  Cancer - Treated with seeds   7. [facility] - Palliative care was consulted during last admission and he declined hospice - Has not been consistently compliant with medications. Paramedic filled 2 week pill box but he still forgets to take them at times - Discussed hospice vs palliative care and that he could receive these services in his home but he declines a new referral at this time; he will let us  know if he changes his mind and we can also continue to discuss  8. Deconditioning - Previously declined PT/OT.  - has difficult roommate situation   Return in 1 month, sooner if needed.   Charlette Console, FNP 07/25/23

## 2023-07-26 ENCOUNTER — Encounter: Payer: Self-pay | Admitting: Family

## 2023-07-26 ENCOUNTER — Encounter (HOSPITAL_COMMUNITY)

## 2023-07-26 ENCOUNTER — Ambulatory Visit: Attending: Family | Admitting: Family

## 2023-07-26 VITALS — BP 135/84 | HR 101 | Wt 160.0 lb

## 2023-07-26 DIAGNOSIS — I2510 Atherosclerotic heart disease of native coronary artery without angina pectoris: Secondary | ICD-10-CM | POA: Diagnosis not present

## 2023-07-26 DIAGNOSIS — Z79899 Other long term (current) drug therapy: Secondary | ICD-10-CM | POA: Insufficient documentation

## 2023-07-26 DIAGNOSIS — Z86718 Personal history of other venous thrombosis and embolism: Secondary | ICD-10-CM | POA: Insufficient documentation

## 2023-07-26 DIAGNOSIS — R5381 Other malaise: Secondary | ICD-10-CM | POA: Diagnosis not present

## 2023-07-26 DIAGNOSIS — Z7984 Long term (current) use of oral hypoglycemic drugs: Secondary | ICD-10-CM | POA: Diagnosis not present

## 2023-07-26 DIAGNOSIS — I110 Hypertensive heart disease with heart failure: Secondary | ICD-10-CM | POA: Diagnosis not present

## 2023-07-26 DIAGNOSIS — Z7901 Long term (current) use of anticoagulants: Secondary | ICD-10-CM | POA: Diagnosis not present

## 2023-07-26 DIAGNOSIS — Z8546 Personal history of malignant neoplasm of prostate: Secondary | ICD-10-CM | POA: Diagnosis not present

## 2023-07-26 DIAGNOSIS — J449 Chronic obstructive pulmonary disease, unspecified: Secondary | ICD-10-CM | POA: Diagnosis not present

## 2023-07-26 DIAGNOSIS — Z720 Tobacco use: Secondary | ICD-10-CM | POA: Diagnosis not present

## 2023-07-26 DIAGNOSIS — J9611 Chronic respiratory failure with hypoxia: Secondary | ICD-10-CM | POA: Diagnosis not present

## 2023-07-26 DIAGNOSIS — Z5986 Financial insecurity: Secondary | ICD-10-CM | POA: Diagnosis not present

## 2023-07-26 DIAGNOSIS — I272 Pulmonary hypertension, unspecified: Secondary | ICD-10-CM | POA: Diagnosis not present

## 2023-07-26 DIAGNOSIS — Z139 Encounter for screening, unspecified: Secondary | ICD-10-CM | POA: Diagnosis not present

## 2023-07-26 DIAGNOSIS — I5082 Biventricular heart failure: Secondary | ICD-10-CM | POA: Insufficient documentation

## 2023-07-26 DIAGNOSIS — I480 Paroxysmal atrial fibrillation: Secondary | ICD-10-CM

## 2023-07-26 DIAGNOSIS — I5022 Chronic systolic (congestive) heart failure: Secondary | ICD-10-CM | POA: Diagnosis not present

## 2023-07-26 DIAGNOSIS — Z85118 Personal history of other malignant neoplasm of bronchus and lung: Secondary | ICD-10-CM | POA: Diagnosis not present

## 2023-07-26 DIAGNOSIS — Z91148 Patient's other noncompliance with medication regimen for other reason: Secondary | ICD-10-CM | POA: Diagnosis not present

## 2023-07-26 DIAGNOSIS — C61 Malignant neoplasm of prostate: Secondary | ICD-10-CM

## 2023-07-26 DIAGNOSIS — Z86711 Personal history of pulmonary embolism: Secondary | ICD-10-CM | POA: Insufficient documentation

## 2023-07-26 NOTE — Progress Notes (Signed)
 [facility] - HEART FAILURE CLINIC - PHARMACIST COUNSELING NOTE  Bruce [name] is a 78 y.o. year old male who presents to the HF clinic for their initial visit. They have a past medical history significant for advanced COPD, HFrEF, severe pulmonary hypertension, history of pulmonary embolism, DVT, artery disease, peripheral artery disease, paroxysmal A-fib, and history of colon cancer and prostate cancer. They were diagnosed with HFrEF with an EF of 25-30%. They've recently been hospitalized after being found by [facility] paramedic during weekly rounds with shortness of breath and hypoxemia with a 9 lb weight gain over the last week. During hospitalization, they were given Lasix  40 mg IV.   Current Guideline-Directed Medical Therapy (GDMT)  ACE/ARB/ARNI: N/A Beta Blocker: Bisoprolol  2.5 mg daily  Aldosterone Antagonist: NA Diuretic: Furosemide  40 mg daily SGLT2i: Dapagliflozin  10 mg daily  Vital Signs and Trends  Recent Trends BP Readings from Last 3 Encounters:  07/23/23 130/80  07/18/23 111/70  07/16/23 102/64   Wt Readings from Last 3 Encounters:  07/23/23 161 lb (73 kg)  07/16/23 149 lb (67.6 kg)  07/16/23 157 lb (71.2 kg)   Today's visit HR 102 BP 135/84 Weight (pounds) 160 lbs  Do you check your weight daily? [x] Yes [] No  Do you check you blood pressure daily [] Yes [x] No  PTA Weight: 158 lbs this morning, 140 lbs last week (they do up and down) PTA BP: don't take it at home   Cardiac Imaging  ECHO: Date 05/25/2023, EF 25-30%  Changes Since Last Visit   Hospitalizations/ED visits (last 6 months):  -- Date 07/16/2023, [facility] Acute on chronic respiratory failure  -- Date 07/02/2023, [facility] Acute dyspnea -- Date 05/24/2023, [facility] Pulmonary embolism  -- Date 04/18/2023, [facility] Respiratory failure  Medication changes: Bisoprolol    Pertinent Labs  Lab Results  Component Value Date   NA 135 07/18/2023   CL 103 07/18/2023   K 4.1 07/18/2023   CO2 23 07/18/2023    BUN 24 (H) 07/18/2023   CREATININE 1.01 07/18/2023   GFRNONAA >60 07/18/2023   CALCIUM  9.5 07/18/2023   ALBUMIN  2.6 (L) 07/04/2023   GLUCOSE 159 (H) 07/18/2023   Lab Results  Component Value Date   LDLCALC 88 02/05/2023   Lab Results  Component Value Date   HGBA1C 5.6 02/05/2023   ASSESSMENT  Reason for today's visit   To assess current guideline directed medical therapy. Patient is here with care giver for a follow-up an appointment post hospitalization. They report that to reduce their fluid intake they suck on ice. Also said that they are cutting out their salt and sugars. Although today and yesterday the legs were swollen with edema. Their weight is up since last week. Patient reports their weigh fluctuates quite a bit throughout the week. When asked about diet, they reported having a sausage and egg sandwich. Explained to the patient what foods have salt in them as patient seemed surprised that the sausage would have a bunch of salt in it. Also asked the patient to start taking their BP regularly. Gave them a BP log and information about what foods have high salt content so they want watch for that in their meals. Patient also reported that they cannot read. Caregiver had questions about hospice but patient remains against this at this time.   Guideline-Directed Medical Therapy Evaluation  ACEi/ARB/ARNi Patient is not on therapy because they developed hypotension on both Entresto  and losartan  25 mg daily  -- The losartan  was stopped after their 04/18/2023  hospitalization  Beta-Blocker  Target Achieved [] Yes [x] No Up-titration candidate today [] Yes [x] No  MRA Patient is not on therapy because they previously did not tolerate it due to hyperkalemia   SGLT-2 Target Achieved [x] Yes [] No Up-titration candidate today [] Yes [x] No  Loop  Increase dose today  [] Yes [x] No Patient could be a candidate for PRN doses, but their poor health literacy hinders this   Heart Failure  Symptoms & Volume Status   Dyspnea on exertion, has to turn up O2 [x] Yes [] No  Fatigue [x] Yes [] No  Lower extremity edema [x] Yes [] No  Weight gain (> 5 lbs) [x] Yes [] No  Cough or wheezing  [] Yes [x] No  Abdominal bloating/Discomfort, gas pain [x] Yes [] No  Early satiety or poor appetite, don't taste anymore  [x] Yes [] No  Dizziness of lightheadedness, get up too fast [x] Yes [] No  # Pillows used at night:   Adherence Assessment  Do you ever forget to take your medication? [x] Yes [] No  Do you ever skip doses due to side effects? [] Yes [x] No  Do you have trouble affording your medicines? [] Yes [x] No  Are you ever unable to pick up your medication due to transportation difficulties? [] Yes [x] No  Do you ever stop taking your medications because you don't believe they are helping? [] Yes [x] No   Adherence strategy:  -- Miss their medications 1 - 2 days per week -- Patient takes their medications in the morning because without food it upsets their stomach -- They have support through paramedicine with [facility] that packs their medications every two weeks and reviews what they should be taking  Barriers to obtaining medications: none identified, drug store owner is working with them  Did you take your medications before your appointment today: [] Yes [x] No  Preventative Care   Parameter Most Recent Result Goal/Status Current Medications  LDL 88 < 70 - 55 mg/dL Just above goal Statin: Crestor  20 mg daily   A1c 5.6  7 - 8 %    BP Control 135/84 < 130/80 NA Additional:   ECHO 25-30% 6 - 12 months Last ECHO 05/25/2023   PLAN  Medication Interventions:  -- Continue current regimen per NP Preventative Care Follow-up:  -- Could consider Zetia  10 mg daily, but concerned about patient's medication literacy and they are just above goal  Lab or imaging orders:  -- Annual ECHO up to date -- BMET today   Time spent: 15 minutes  Thank you for allowing pharmacy to  participate in this patient's care.   Kavish Lafitte K Quasim Doyon, Pharm.D. Pharmacy Resident 07/26/2023 7:28 AM

## 2023-07-26 NOTE — Patient Instructions (Signed)
 Medication Changes:  No changes, continue current medications  Lab Work:  Labs needed today: Go DOWN to LOWER LEVEL (LL) to have your blood work completed inside of [employer] office.  We will only call you if the results are abnormal or if the provider would like to make medication changes.  Special Instructions // Education:  Do the following things EVERYDAY: Weigh yourself in the morning before breakfast. Write it down and keep it in a log. Take your medicines as prescribed Eat low salt foods--Limit salt (sodium) to 2000 mg per day.  Stay as active as you can everyday Limit all fluids for the day to less than 2 liters   Follow-Up in: 3-4 weeks    If you have any questions or concerns before your next appointment please send us  a message through [place] or call our office at [phone], If it is after office hours your call will be answered by our answering service and directed appropriately.     At the Advanced [facility] Failure Clinic, you and your health needs are our priority. We have a designated team specialized in the treatment of Heart Failure. This Care Team includes your primary Heart Failure Specialized Cardiologist (physician), Advanced Practice Providers (APPs- Physician Assistants and Nurse Practitioners), and Pharmacist who all work together to provide you with the care you need, when you need it.   You may see any of the following providers on your designated Care Team at your next follow up:  Dr. Jules Oar Dr. Peder Bourdon Dr. Alwin Baars Dr. Judyth Nunnery Nieves Bars, NP [place], [place] [address] [place], [place] Dennise Fitz, NP [place] Lee, NP [place] Dellen, NP Bevely Brush, PharmD

## 2023-07-27 ENCOUNTER — Ambulatory Visit: Payer: Self-pay | Admitting: Family

## 2023-07-27 ENCOUNTER — Ambulatory Visit: Admitting: Family

## 2023-07-27 LAB — BASIC METABOLIC PANEL WITH GFR
BUN/Creatinine Ratio: 16 (ref 10–24)
BUN: 16 mg/dL (ref 8–27)
CO2: 17 mmol/L — ABNORMAL LOW (ref 20–29)
Calcium: 9.7 mg/dL (ref 8.6–10.2)
Chloride: 103 mmol/L (ref 96–106)
Creatinine, Ser: 1.01 mg/dL (ref 0.76–1.27)
Glucose: 99 mg/dL (ref 70–99)
Potassium: 4.4 mmol/L (ref 3.5–5.2)
Sodium: 139 mmol/L (ref 134–144)
eGFR: 78 mL/min/{1.73_m2} (ref >59)

## 2023-07-27 LAB — T4, FREE: Free T4: 1.54 ng/dL (ref 0.82–1.77)

## 2023-07-27 LAB — T3, FREE: T3, Free: 2.3 pg/mL (ref 2.0–4.4)

## 2023-07-27 LAB — TSH: TSH: 4.91 u[IU]/mL — ABNORMAL HIGH (ref 0.450–4.500)

## 2023-07-30 ENCOUNTER — Telehealth: Payer: Self-pay | Admitting: Physician Assistant

## 2023-07-30 NOTE — Telephone Encounter (Signed)
 Call received from [facility] that pt is having significantly more dyspnea today compared to Friday. She at first inquires about a hospice referral. I see paramedicine saw him on the 19th and he had not been taking medications. That note also indicated he was turned down for hospice due to not wanting to change oxygen  supply companies.   [name] was not present at the hospice meeting and indicated that he may not have understood what hospice was trying to provide.  I reviewed his clinical scenario: hx of COPD, chronic respiratory failure, RV failure, and recent PE in the setting of OAC noncompliance. Given continued medication noncompliance, increased confusion, and increased dyspnea, I recommended EMS dispatch for likely ER evaluation.  She expressed understanding of the recommendation.  Bruce Pilot, PA-C 07/30/2023, 3:06 PM [phone] [facility] [address] [place], [place] [address]

## 2023-08-01 DIAGNOSIS — J9620 Acute and chronic respiratory failure, unspecified whether with hypoxia or hypercapnia: Secondary | ICD-10-CM | POA: Diagnosis not present

## 2023-08-01 DIAGNOSIS — J449 Chronic obstructive pulmonary disease, unspecified: Secondary | ICD-10-CM | POA: Diagnosis not present

## 2023-08-01 DIAGNOSIS — R0609 Other forms of dyspnea: Secondary | ICD-10-CM | POA: Diagnosis not present

## 2023-08-01 DIAGNOSIS — J479 Bronchiectasis, uncomplicated: Secondary | ICD-10-CM | POA: Diagnosis not present

## 2023-08-01 NOTE — Telephone Encounter (Signed)
 Spoke to [name] this morning- he reports feeling well with no complaints and says he has no symptoms at present aside from his chronic ongoing shortness of breath but he says it is manageable at home. I plan to see him in the home next Monday for our routine paramedicine visit. He knows to reach out to me if anything changes. Call complete. Will forward to HF team.   Roberts Ching, EMT-Paramedic [phone] 08/01/2023

## 2023-08-02 ENCOUNTER — Telehealth: Payer: Self-pay | Admitting: Family

## 2023-08-02 ENCOUNTER — Other Ambulatory Visit: Payer: Self-pay | Admitting: Family

## 2023-08-02 NOTE — Telephone Encounter (Signed)
 Patient's friend, Bruce [name], left VM requesting a refill of pain meds for the patient. This same scenario happened on 5/21 and I spoke with the patient and advised him that Bruce [name] is not on his HIPAA form so I cannot speak with him. [name] verbalized understanding. I also let Bruce [name] know that Mylinda Asa said she is not comfortable writing him pain meds and he verbalized understanding. Patient no-showed his appointment with Mylinda Asa last week.   Called and spoke with Bruce [name] and explained all of this to him again and he verbalized understanding. He comes in 6/9 for follow up and can discuss his concerns then and advised him if he wants us  to be able to speak with [name] that he needs to be added to his HIPAA form.

## 2023-08-03 NOTE — Telephone Encounter (Signed)
 Patient was seen by Mylinda Asa shortly after this message

## 2023-08-06 ENCOUNTER — Other Ambulatory Visit (HOSPITAL_COMMUNITY): Payer: Self-pay

## 2023-08-06 ENCOUNTER — Telehealth (HOSPITAL_COMMUNITY): Payer: Self-pay

## 2023-08-06 NOTE — Progress Notes (Signed)
 Paramedicine Encounter    Patient ID: Bruce [name], male    DOB: [birth year], 78 y.o.   MRN: [id number]   Complaints- shortness of breath on exertion, pain in- right rib, tailbone and both lower legs.   Assessment- CAOX4, warm and dry seated in recliner in living room of his roommates home, did not appear short of breath at rest, but noted on exertion with minimal movement, lungs clear but diminished, vitals obtained as noted, lower leg swelling noted +3 edema in both lower legs.   Compliance with meds- missed three morning, three lunch, three nighttime doses.   Pill box filled- for one week   Refills needed-  -eliquis  -ceterizine -potassium -mucinex    Meds changes since last visit- none     Social changes- Now is willing to accept hospice in the home, I will message PCP and HF clinic for follow up on same.    VISIT SUMMARY- Arrived for home visit for [facility] who reports to be feeling okay but having pain in his tailbone, right rib and both lower legs. He denied any recent falls or trauma. I obtained vitals and assessment. Lungs clear but diminished. Lower legs pitting edema noted bilaterally up to knees. Reports he is taking his mediations. Three doses missed in pill box. He says he is eating but not a lot. I reviewed meds and filled pill box for one week. Refills reviewed and called into [facility]. We discussed HF education with sodium and fluid restrictions. We also talked about hospice and he is now agreeable. I will reach out to PCP and or HF clinic for same referral to be placed. Home visit complete. I will see Julies in one week.   BP 122/60   Pulse 96   Resp 16   Wt 162 lb (73.5 kg)   SpO2 90%   BMI 21.37 kg/m  Weight yesterday-did not weigh   Last visit weight-16lbs      ACTION: Home visit completed     Patient Care Team: Trenda Frisk, FNP as PCP - General (Family Medicine) Cherrie Cornwall, MD as PCP - Cardiology (Cardiology) Claudene Crystal, RN as  [facility] Care Management  Patient Active Problem List   Diagnosis Date Noted   Acute on chronic respiratory failure with hypoxemia (HCC) 07/16/2023   Empyema lung (HCC) 07/04/2023   Severe sepsis (HCC) 07/03/2023   Empyema (HCC) 07/02/2023   Medication management 07/02/2023   Hypoxia 07/02/2023   Hypokalemia 07/02/2023   Acute on chronic systolic CHF (congestive heart failure) (HCC) 07/02/2023   History of CAD (coronary artery disease) 07/02/2023   COPD (chronic obstructive pulmonary disease) (HCC) 07/02/2023   History of pulmonary embolism 07/02/2023   History of DVT (deep vein thrombosis) 07/02/2023   History of cigarette smoking 07/02/2023   History of lung cancer 07/02/2023   History of colon cancer 07/02/2023   History of prostate cancer 07/02/2023   COPD with acute exacerbation (HCC) 07/02/2023   Physical deconditioning 06/14/2023   NICM (nonischemic cardiomyopathy) (HCC) 05/26/2023   Pulmonary arterial hypertension (HCC) 05/26/2023   Noncompliance 05/26/2023   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   Pleural effusion, right 05/24/2023   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   Pulmonary embolism on right (HCC) 05/24/2023   Neuropathy 05/19/2023   Acute on chronic respiratory failure with hypoxia (HCC) 04/19/2023   Acute decompensated heart failure (HCC) 04/18/2023   Prolonged Q-T interval on ECG 04/18/2023   Influenza A 04/18/2023   Cor pulmonale (chronic) (HCC) 04/18/2023  Peripheral artery disease (HCC) 12/18/2022   B12 deficiency 12/18/2022   MDD (major depressive disorder), recurrent episode, moderate (HCC) 09/01/2022   Pulmonary hypertension (HCC) 04/14/2022   Acute on chronic combined systolic and diastolic CHF (congestive heart failure) (HCC) 04/13/2022   Pulmonary embolism (HCC) 04/09/2022   Paroxysmal atrial fibrillation (HCC)    NSTEMI (non-ST elevated myocardial infarction) (HCC) 10/10/2018   Syncope 04/28/2018    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   Limb ischemia 11/18/2013   Acute blood loss anemia 10/08/2013   Anxiety disorder 10/08/2013   Dyslipidemia 10/08/2013   GERD (gastroesophageal reflux disease) 10/08/2013   Anticoagulated 10/08/2013   Hypertension    CAD (coronary artery disease) 08/16/2010   Acute exacerbation of chronic obstructive pulmonary disease (COPD) (HCC) 08/16/2010   DVT of leg (deep venous thrombosis) (HCC) 08/16/2010    Current Outpatient Medications:    albuterol  (ACCUNEB ) 0.63 MG/3ML nebulizer solution, Take 3 mLs (0.63 mg total) by nebulization every 6 (six) hours as needed for wheezing., Disp: 75 mL, Rfl: 12   albuterol  (VENTOLIN  HFA) 108 (90 Base) MCG/ACT inhaler, TAKE TWO PUFFS BY MOUTH AS NEEDED FOR WHEEZING EVERY 4-6 HOURS (Patient taking differently: Inhale 2 puffs into the lungs every 4 (four) hours as needed for wheezing or shortness of breath.), Disp: 8.5 g, Rfl: 5   ALPRAZolam  (XANAX ) 0.5 MG tablet, Take 1 tablet (0.5 mg total) by mouth 3 (three) times daily as needed for anxiety., Disp: 10 tablet, Rfl: 0   apixaban  (ELIQUIS ) 5 MG TABS tablet, TAKE ONE TABLET (5 MG TOTAL) BY MOUTH TWO (TWO) TIMES DAILY. (Patient taking differently: Take 5 mg by mouth 2 (two) times daily.), Disp: 60 tablet, Rfl: 11   bisoprolol  (ZEBETA ) 5 MG tablet, Take 0.5 tablets (2.5 mg total) by mouth daily., Disp: 45 tablet, Rfl: 3   budesonide  (PULMICORT ) 0.25 MG/2ML nebulizer solution, Take 2 mLs (0.25 mg total) by nebulization 2 (two) times daily., Disp: 60 mL, Rfl: 12   cetirizine  (ZYRTEC ) 10 MG tablet, Take 1 tablet (10 mg total) by mouth daily. (Patient taking differently: Take 10 mg by mouth in the morning.), Disp: 30 tablet, Rfl: 11   cholecalciferol  (VITAMIN D3) 25 MCG (1000 UNIT) tablet, Take 1,000 Units by mouth in the morning., Disp: , Rfl:    FARXIGA  10 MG TABS tablet, TAKE ONE TABLET (10 MG TOTAL) BY MOUTH DAILY. (Patient taking differently: Take 10 mg by mouth in the morning.), Disp: 90  tablet, Rfl: 1   feeding supplement (ENSURE ENLIVE / ENSURE PLUS) LIQD, Take 237 mLs by mouth 2 (two) times daily between meals., Disp: 10000 mL, Rfl: 0   fluticasone  (FLONASE ) 50 MCG/ACT nasal spray, Place 1 spray into both nostrils daily as needed for allergies., Disp: , Rfl:    furosemide  (LASIX ) 20 MG tablet, Take 2 tablets (40 mg total) by mouth daily. (Patient taking differently: Take 40 mg by mouth in the morning.), Disp: 60 tablet, Rfl: 11   gabapentin  (NEURONTIN ) 100 MG capsule, TAKE ONE CAPSULE (100 MG TOTAL) BY MOUTH THREE (THREE) TIMES DAILY. (Patient taking differently: Take 100 mg by mouth 3 (three) times daily.), Disp: 90 capsule, Rfl: 1   guaiFENesin  (MUCINEX ) 600 MG 12 hr tablet, Take 2 tablets (1,200 mg total) by mouth 2 (two) times daily., Disp: 60 tablet, Rfl: 0   lidocaine  (LIDODERM ) 5 %, Place 1 patch onto the skin daily. Remove & Discard patch within 12 hours or as directed by MD, Disp: 30 patch, Rfl: 11   Multiple Vitamin (MULTIVITAMIN) tablet,  Take 1 tablet by mouth in the morning. For Men, Disp: , Rfl:    Omega-3 Fatty Acids (FISH OIL PO), Take 1,000 mg by mouth every evening., Disp: , Rfl:    omeprazole  (PRILOSEC) 40 MG capsule, Take 1 capsule (40 mg total) by mouth 2 (two) times daily., Disp: 180 capsule, Rfl: 1   oxyCODONE  (OXY IR/ROXICODONE ) 5 MG immediate release tablet, Take 1 tablet (5 mg total) by mouth every 6 (six) hours as needed for severe pain (pain score 7-10)., Disp: 20 tablet, Rfl: 0   OXYGEN , Inhale 3 L/min into the lungs continuous., Disp: , Rfl:    potassium chloride  SA (KLOR-CON  M) 20 MEQ tablet, Take 1 tablet (20 mEq total) by mouth daily., Disp: 30 tablet, Rfl: 0   rOPINIRole  (REQUIP ) 1 MG tablet, TAKE ONE TABLET (1 MG TOTAL) BY MOUTH IN THE MORNING AND AT BEDTIME. (Patient taking differently: Take 1 mg by mouth.), Disp: 60 tablet, Rfl: 3   rosuvastatin  (CRESTOR ) 20 MG tablet, Take 1 tablet (20 mg total) by mouth daily. (Patient taking differently: Take  20 mg by mouth every evening.), Disp: 30 tablet, Rfl: 2   sildenafil  (REVATIO ) 20 MG tablet, Take 1 tablet (20 mg total) by mouth 3 (three) times daily., Disp: 90 tablet, Rfl: 5   Tiotropium Bromide-Olodaterol (STIOLTO RESPIMAT ) 2.5-2.5 MCG/ACT AERS, Inhale 2 puffs into the lungs daily. (Patient taking differently: Inhale 2 puffs into the lungs in the morning.), Disp: 4 g, Rfl: 12 Allergies  Allergen Reactions   Eggs-Apples-Oats [Alitraq] Other (See Comments)    -Caused chest pains   Adhesive [Tape] Other (See Comments)    After right leg fracture surgery, pt developed a large blister where tape was applied to his right leg. OK to use paper tape.   Imdur  [Isosorbide  Dinitrate] Other (See Comments)    -hallucinations   Singulair  [Montelukast  Sodium] Other (See Comments)    -Hallucinations      Social History   Socioeconomic History   Marital status: Single    Spouse name: Not on file   Number of children: 2   Years of education: Not on file   Highest education level: 6th grade  Occupational History   Not on file  Tobacco Use   Smoking status: Former    Current packs/day: 0.00    Average packs/day: 0.5 packs/day for 52.0 years (26.0 ttl pk-yrs)    Types: Cigarettes    Start date: 07/05/1970    Quit date: 07/05/2022    Years since quitting: 1.0   Smokeless tobacco: Never  Vaping Use   Vaping status: Never Used  Substance and Sexual Activity   Alcohol use: No    Alcohol/week: 0.0 standard drinks of alcohol    Comment: Stopped 2009   Drug use: No   Sexual activity: Not Currently  Other Topics Concern   Not on file  Social History Narrative   ** Merged History Encounter **       Social Drivers of Health   Financial Resource Strain: High Risk (04/12/2022)   Overall Financial Resource Strain (CARDIA)    Difficulty of Paying Living Expenses: Hard  Food Insecurity: Food Insecurity Present (07/16/2023)   Hunger Vital Sign    Worried About Running Out of Food in the Last Year:  Sometimes true    Ran Out of Food in the Last Year: Sometimes true  Transportation Needs: No Transportation Needs (07/16/2023)   PRAPARE - Transportation    Lack of Transportation (Medical): No    Lack  of Transportation (Non-Medical): No  Recent Concern: Transportation Needs - Unmet Transportation Needs (05/25/2023)   PRAPARE - Administrator, Civil Service (Medical): Yes    Lack of Transportation (Non-Medical): No  Physical Activity: Inactive (02/05/2017)   Exercise Vital Sign    Days of Exercise per Week: 0 days    Minutes of Exercise per Session: 0 min  Stress: Stress Concern Present (02/05/2017)   [employer] of Occupational Health - Occupational Stress Questionnaire    Feeling of Stress : Rather much  Social Connections: Unknown (07/16/2023)   Social Connection and Isolation Panel [NHANES]    Frequency of Communication with Friends and Family: Twice a week    Frequency of Social Gatherings with Friends and Family: Once a week    Attends Religious Services: Patient declined    Database administrator or Organizations: No    Attends Banker Meetings: Never    Marital Status: Divorced  Catering manager Violence: Not At Risk (07/16/2023)   Humiliation, Afraid, Rape, and Kick questionnaire    Fear of Current or Ex-Partner: No    Emotionally Abused: No    Physically Abused: No    Sexually Abused: No    Physical Exam      Future Appointments  Date Time Provider Department Center  08/13/2023  1:15 PM Trenda Frisk, FNP [facility] None  08/20/2023  2:45 PM Bensimhon, Rheta Celestine, MD [facility] None

## 2023-08-06 NOTE — Telephone Encounter (Signed)
 Saw patient in the home today and we discussed hospice and he is now agreeable to their services. I will message providers to place referral.   Roberts Ching, EMT-Paramedic [phone] 08/06/2023

## 2023-08-13 ENCOUNTER — Other Ambulatory Visit (HOSPITAL_COMMUNITY): Payer: Self-pay

## 2023-08-13 ENCOUNTER — Other Ambulatory Visit: Payer: Self-pay | Admitting: Internal Medicine

## 2023-08-13 ENCOUNTER — Telehealth (HOSPITAL_COMMUNITY): Payer: Self-pay

## 2023-08-13 ENCOUNTER — Telehealth: Payer: Self-pay

## 2023-08-13 ENCOUNTER — Ambulatory Visit: Admitting: Family

## 2023-08-13 DIAGNOSIS — J9611 Chronic respiratory failure with hypoxia: Secondary | ICD-10-CM

## 2023-08-13 DIAGNOSIS — I5022 Chronic systolic (congestive) heart failure: Secondary | ICD-10-CM

## 2023-08-13 DIAGNOSIS — I272 Pulmonary hypertension, unspecified: Secondary | ICD-10-CM

## 2023-08-13 NOTE — Telephone Encounter (Signed)
 Palliative care referral placed per Shawnee Dellen, FNP.

## 2023-08-13 NOTE — Telephone Encounter (Signed)
 Reached out to [facility] and spoke to admission nurse Alois Arnt who reports they do have [facility] referral but needed a DME list of equipment from [employer] to ensure they would be able to cover supplies under the hospice contract. I contacted [employer] and had them send the list over to Hospice and notified Alois Arnt and they will be working on getting him approved for admission once billing approves the oxygen  DME order.    Roberts Ching, EMT-Paramedic [phone] 08/13/2023

## 2023-08-13 NOTE — Progress Notes (Signed)
 Paramedicine Encounter    Patient ID: Bruce [name], male    DOB: 12/31/1945, 78 y.o.   MRN: [id number]   Complaints- right ankle wound, lower back and tail bone, edema in both lower legs, shortness of breath, fatigue and weakness.   Assessment- CAOX4, warm and dry seated in his bed reporting to be feeling short of breath even on his oxygen , lungs diminished on assessment, lower legs edema >+3, urine noted in milk jug bed side dark, vitals obtained as noted- he was too weak to stand to weigh, he was short of breath just lifting his legs from under his blankets on the bed for me to assess.   Compliance with meds- he reports being compliant - pill box empty   Pill box filled- for one week  Refills needed- eliquis , ceterizine, potassium, albuterol  solution for neb   Meds changes since last visit- none    Social changes- declining at home- referral follow up for hospice (message sent to [facility] for same) I will continue to follow. Referral to be re-placed today.    VISIT SUMMARY- Arrived for home visit for [facility] who was laying upright in bed CAOX4, warm and dry reporting to not be feeling well- he reports having shortness of breath despite using his oxygen , even at rest he is struggling to breathe. He lifted his legs out from his blankets in the bed and with minimal movement he was short of breath. He is using 5LPM of oxygen . Lungs diminished on assessment. +3 edema in both lower legs. Right ankle bone ulcer present- he needs wound care for same. He has a jug of urine at bedside noted to be dark yellow. His bed sheets had blood and stool stains- appearing he is having accidents on himself which he admitted to. He denied blood in his stool upon questioning. Vitals as noted. I reviewed meds- pill box empty as if he is taking meds unable to confirm. Pill box filled for one week but will need a visit next week due to not having enough medications for two weeks. I will have Alfonza Angry CP follow up.  We reviewed upcoming appointments and confirmed same. He says he is unsure if he will be able to attend due to weakness and shortness of breath. He says he is willing to try. I discussed at length with him his decline and the need for more intense care. He refused to go to the hospital but agreed to home care hospice. His roommate Tiburcio Fly is living with him but unable to provide any medical assistance. He knows to call 911 in case of emergency and to reach out if needed.  I will follow up. Home visit complete.   Roberts Ching, EMT-Paramedic [phone] 08/13/2023   Hospice referral requested from [facility]- will be replaced today.   BP 110/68   Pulse 99   SpO2 91%  Weight yesterday-- didn't weigh  Last visit weight-- 162lbs      ACTION: Home visit completed     Patient Care Team: Trenda Frisk, FNP as PCP - General (Family Medicine) Cherrie Cornwall, MD as PCP - Cardiology (Cardiology) Claudene Crystal, RN as [facility] Care Management  Patient Active Problem List   Diagnosis Date Noted   Acute on chronic respiratory failure with hypoxemia (HCC) 07/16/2023   Empyema lung (HCC) 07/04/2023   Severe sepsis (HCC) 07/03/2023   Empyema (HCC) 07/02/2023   Medication management 07/02/2023   Hypoxia 07/02/2023   Hypokalemia 07/02/2023   Acute on  chronic systolic CHF (congestive heart failure) (HCC) 07/02/2023   History of CAD (coronary artery disease) 07/02/2023   COPD (chronic obstructive pulmonary disease) (HCC) 07/02/2023   History of pulmonary embolism 07/02/2023   History of DVT (deep vein thrombosis) 07/02/2023   History of cigarette smoking 07/02/2023   History of lung cancer 07/02/2023   History of colon cancer 07/02/2023   History of prostate cancer 07/02/2023   COPD with acute exacerbation (HCC) 07/02/2023   Physical deconditioning 06/14/2023   NICM (nonischemic cardiomyopathy) (HCC) 05/26/2023   Pulmonary arterial hypertension (HCC) 05/26/2023   Noncompliance  05/26/2023   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   Pleural effusion, right 05/24/2023   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   Pulmonary embolism on right (HCC) 05/24/2023   Neuropathy 05/19/2023   Acute on chronic respiratory failure with hypoxia (HCC) 04/19/2023   Acute decompensated heart failure (HCC) 04/18/2023   Prolonged Q-T interval on ECG 04/18/2023   Influenza A 04/18/2023   Cor pulmonale (chronic) (HCC) 04/18/2023   Peripheral artery disease (HCC) 12/18/2022   B12 deficiency 12/18/2022   MDD (major depressive disorder), recurrent episode, moderate (HCC) 09/01/2022   Pulmonary hypertension (HCC) 04/14/2022   Acute on chronic combined systolic and diastolic CHF (congestive heart failure) (HCC) 04/13/2022   Pulmonary embolism (HCC) 04/09/2022   Paroxysmal atrial fibrillation (HCC)    NSTEMI (non-ST elevated myocardial infarction) (HCC) 10/10/2018   Syncope 04/28/2018   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   Limb ischemia 11/18/2013   Acute blood loss anemia 10/08/2013   Anxiety disorder 10/08/2013   Dyslipidemia 10/08/2013   GERD (gastroesophageal reflux disease) 10/08/2013   Anticoagulated 10/08/2013   Hypertension    CAD (coronary artery disease) 08/16/2010   Acute exacerbation of chronic obstructive pulmonary disease (COPD) (HCC) 08/16/2010   DVT of leg (deep venous thrombosis) (HCC) 08/16/2010    Current Outpatient Medications:    albuterol  (ACCUNEB ) 0.63 MG/3ML nebulizer solution, Take 3 mLs (0.63 mg total) by nebulization every 6 (six) hours as needed for wheezing., Disp: 75 mL, Rfl: 12   albuterol  (VENTOLIN  HFA) 108 (90 Base) MCG/ACT inhaler, TAKE TWO PUFFS BY MOUTH AS NEEDED FOR WHEEZING EVERY 4-6 HOURS (Patient taking differently: Inhale 2 puffs into the lungs every 4 (four) hours as needed for wheezing or shortness of breath.), Disp: 8.5 g, Rfl: 5   ALPRAZolam  (XANAX ) 0.5 MG tablet, Take 1 tablet (0.5 mg total) by mouth 3  (three) times daily as needed for anxiety., Disp: 10 tablet, Rfl: 0   apixaban  (ELIQUIS ) 5 MG TABS tablet, TAKE ONE TABLET (5 MG TOTAL) BY MOUTH TWO (TWO) TIMES DAILY. (Patient taking differently: Take 5 mg by mouth 2 (two) times daily.), Disp: 60 tablet, Rfl: 11   bisoprolol  (ZEBETA ) 5 MG tablet, Take 0.5 tablets (2.5 mg total) by mouth daily., Disp: 45 tablet, Rfl: 3   budesonide  (PULMICORT ) 0.25 MG/2ML nebulizer solution, Take 2 mLs (0.25 mg total) by nebulization 2 (two) times daily., Disp: 60 mL, Rfl: 12   cetirizine  (ZYRTEC ) 10 MG tablet, Take 1 tablet (10 mg total) by mouth daily. (Patient taking differently: Take 10 mg by mouth in the morning.), Disp: 30 tablet, Rfl: 11   cholecalciferol  (VITAMIN D3) 25 MCG (1000 UNIT) tablet, Take 1,000 Units by mouth in the morning., Disp: , Rfl:    FARXIGA  10 MG TABS tablet, TAKE ONE TABLET (10 MG TOTAL) BY MOUTH DAILY. (Patient taking differently: Take 10 mg by mouth in the morning.), Disp: 90 tablet, Rfl: 1   feeding  supplement (ENSURE ENLIVE / ENSURE PLUS) LIQD, Take 237 mLs by mouth 2 (two) times daily between meals., Disp: 10000 mL, Rfl: 0   fluticasone  (FLONASE ) 50 MCG/ACT nasal spray, Place 1 spray into both nostrils daily as needed for allergies., Disp: , Rfl:    furosemide  (LASIX ) 20 MG tablet, Take 2 tablets (40 mg total) by mouth daily. (Patient taking differently: Take 40 mg by mouth in the morning.), Disp: 60 tablet, Rfl: 11   gabapentin  (NEURONTIN ) 100 MG capsule, TAKE ONE CAPSULE (100 MG TOTAL) BY MOUTH THREE (THREE) TIMES DAILY. (Patient taking differently: Take 100 mg by mouth 3 (three) times daily.), Disp: 90 capsule, Rfl: 1   guaiFENesin  (MUCINEX ) 600 MG 12 hr tablet, Take 2 tablets (1,200 mg total) by mouth 2 (two) times daily., Disp: 60 tablet, Rfl: 0   lidocaine  (LIDODERM ) 5 %, Place 1 patch onto the skin daily. Remove & Discard patch within 12 hours or as directed by MD, Disp: 30 patch, Rfl: 11   Multiple Vitamin (MULTIVITAMIN) tablet,  Take 1 tablet by mouth in the morning. For Men, Disp: , Rfl:    Omega-3 Fatty Acids (FISH OIL PO), Take 1,000 mg by mouth every evening., Disp: , Rfl:    omeprazole  (PRILOSEC) 40 MG capsule, Take 1 capsule (40 mg total) by mouth 2 (two) times daily., Disp: 180 capsule, Rfl: 1   oxyCODONE  (OXY IR/ROXICODONE ) 5 MG immediate release tablet, Take 1 tablet (5 mg total) by mouth every 6 (six) hours as needed for severe pain (pain score 7-10)., Disp: 20 tablet, Rfl: 0   OXYGEN , Inhale 3 L/min into the lungs continuous., Disp: , Rfl:    rOPINIRole  (REQUIP ) 1 MG tablet, TAKE ONE TABLET (1 MG TOTAL) BY MOUTH IN THE MORNING AND AT BEDTIME. (Patient taking differently: Take 1 mg by mouth.), Disp: 60 tablet, Rfl: 3   rosuvastatin  (CRESTOR ) 20 MG tablet, Take 1 tablet (20 mg total) by mouth daily. (Patient taking differently: Take 20 mg by mouth every evening.), Disp: 30 tablet, Rfl: 2   sildenafil  (REVATIO ) 20 MG tablet, Take 1 tablet (20 mg total) by mouth 3 (three) times daily., Disp: 90 tablet, Rfl: 5   Tiotropium Bromide-Olodaterol (STIOLTO RESPIMAT ) 2.5-2.5 MCG/ACT AERS, Inhale 2 puffs into the lungs daily. (Patient taking differently: Inhale 2 puffs into the lungs in the morning.), Disp: 4 g, Rfl: 12   potassium chloride  SA (KLOR-CON  M) 20 MEQ tablet, Take 1 tablet (20 mEq total) by mouth daily., Disp: 30 tablet, Rfl: 0 Allergies  Allergen Reactions   Eggs-Apples-Oats [Alitraq] Other (See Comments)    -Caused chest pains   Adhesive [Tape] Other (See Comments)    After right leg fracture surgery, pt developed a large blister where tape was applied to his right leg. OK to use paper tape.   Imdur  [Isosorbide  Dinitrate] Other (See Comments)    -hallucinations   Singulair  [Montelukast  Sodium] Other (See Comments)    -Hallucinations      Social History   Socioeconomic History   Marital status: Single    Spouse name: Not on file   Number of children: 2   Years of education: Not on file   Highest  education level: 6th grade  Occupational History   Not on file  Tobacco Use   Smoking status: Former    Current packs/day: 0.00    Average packs/day: 0.5 packs/day for 52.0 years (26.0 ttl pk-yrs)    Types: Cigarettes    Start date: 07/05/1970    Quit date:  07/05/2022    Years since quitting: 1.1   Smokeless tobacco: Never  Vaping Use   Vaping status: Never Used  Substance and Sexual Activity   Alcohol use: No    Alcohol/week: 0.0 standard drinks of alcohol    Comment: Stopped 2009   Drug use: No   Sexual activity: Not Currently  Other Topics Concern   Not on file  Social History Narrative   ** Merged History Encounter **       Social Drivers of Health   Financial Resource Strain: High Risk (04/12/2022)   Overall Financial Resource Strain (CARDIA)    Difficulty of Paying Living Expenses: Hard  Food Insecurity: Food Insecurity Present (07/16/2023)   Hunger Vital Sign    Worried About Running Out of Food in the Last Year: Sometimes true    Ran Out of Food in the Last Year: Sometimes true  Transportation Needs: No Transportation Needs (07/16/2023)   PRAPARE - Administrator, Civil Service (Medical): No    Lack of Transportation (Non-Medical): No  Recent Concern: Transportation Needs - Unmet Transportation Needs (05/25/2023)   PRAPARE - Administrator, Civil Service (Medical): Yes    Lack of Transportation (Non-Medical): No  Physical Activity: Inactive (02/05/2017)   Exercise Vital Sign    Days of Exercise per Week: 0 days    Minutes of Exercise per Session: 0 min  Stress: Stress Concern Present (02/05/2017)   [employer] of Occupational Health - Occupational Stress Questionnaire    Feeling of Stress : Rather much  Social Connections: Unknown (07/16/2023)   Social Connection and Isolation Panel [NHANES]    Frequency of Communication with Friends and Family: Twice a week    Frequency of Social Gatherings with Friends and Family: Once a week    Attends  Religious Services: Patient declined    Database administrator or Organizations: No    Attends Banker Meetings: Never    Marital Status: Divorced  Catering manager Violence: Not At Risk (07/16/2023)   Humiliation, Afraid, Rape, and Kick questionnaire    Fear of Current or Ex-Partner: No    Emotionally Abused: No    Physically Abused: No    Sexually Abused: No    Physical Exam      Future Appointments  Date Time Provider Department Center  08/20/2023  2:45 PM Bensimhon, Rheta Celestine, MD [facility] None  08/22/2023 11:15 AM Trenda Frisk, FNP [facility] None

## 2023-08-14 ENCOUNTER — Other Ambulatory Visit: Payer: Self-pay

## 2023-08-14 DIAGNOSIS — I5022 Chronic systolic (congestive) heart failure: Secondary | ICD-10-CM

## 2023-08-17 ENCOUNTER — Telehealth: Payer: Self-pay | Admitting: Internal Medicine

## 2023-08-17 NOTE — Telephone Encounter (Signed)
 Called to confirm/remind patient of their appointment at [facility] on 08/20/23.   Appointment:   [] Confirmed  [x] Left mess   [] No answer/No voice mail  [] VM Full/unable to leave message  [] Phone not in service  Patient reminded to bring all medications and/or complete list.  Confirmed patient has transportation. Gave directions, instructed to utilize valet parking.

## 2023-08-20 ENCOUNTER — Encounter: Admitting: Internal Medicine

## 2023-08-20 NOTE — Progress Notes (Deleted)
 Patient did not show for appointment. Note left for templating purposes only.    ADVANCED HF CLINIC NOTE  PCP: Glendale Landmark NP Primary Cardiologist: Dr Julane Ny  Radiation Oncology: Dr Jacalyn Martin   HPI: Bruce [name] is a 78 y/o with COPD and ongoing tobacco use, CAD, systolic HF EF 40-45%, colon CA previous lung CA, prostate cancer, PE, DVT, and HFrEF.   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 Missed several doses of xarelto  prior to admit. Also had a/c LE DVT.  EF also found to be down to 20-25%. RV severely reduced. Cath minimal CAD, moderate to severe PAH with evidence of cor pulmonale (LPAP > RPAP - checked multiple times). GDMT limited by BP. Entresto  lowered BP to much so he was switched to losartan . Discharged 04/18/22 to SNF. Discharge to home 05/05/22.   - VQ scan 5/24 multiple VQ mismatches concerning for chronic PE - PFTs 12/24 FEV1 1.97 (64%) FVC 3.27 (77%) Ratio 60%  post BD FEV1 2.58 (69%) DLCO 40% - s/p thoracentesis or R effusion - CT 4/25 No acute PE. Chronic occlusion RML and RLL PA. Non occlusive mural clot RM, LUL and LLL segmental branches  Admitted 5/25 with recurrent resp failure. Treated for COPD flare. Discharged home with Hospice  Since we last saw him has had PFTs, VQ and CT suggestive of CTEPH.   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    RHC 04/13/2022 Ao = 122/78 (96) LV = 123/6 RA = 4 RV = 77/8 LPA = 73/23 (39) RPA = 85/24 (44)  PCW = 16 Fick cardiac output/index = 4.3/2.2 PVR = 5.0  Ao sat = 90% PA sat = 51%, 50%  Echo 04/2022   1. Left ventricular ejection fraction, by estimation, is 20 to 25%. The  left ventricle has severely decreased function. The left ventricle  demonstrates global hypokinesis. The left ventricular internal cavity size  was  mildly dilated. Left ventricular  diastolic parameters are consistent with Grade I diastolic dysfunction  (impaired relaxation).   2. Mildly D-shaped interventricular septum suggests RV pressure/volume  overload. Right ventricular systolic function is severely reduced. The  right ventricular size is moderately enlarged. Tricuspid regurgitation  signal is inadequate for assessing PA  pressure.   3. The mitral valve is normal in structure. No evidence of mitral valve  regurgitation. No evidence of mitral stenosis.   4. The tricuspid valve is abnormal. Tricuspid valve regurgitation is mild  to moderate.   5. The aortic valve is tricuspid. There is mild calcification of the  aortic valve. Aortic valve regurgitation is not visualized. No aortic  stenosis is present.  ROS: All systems negative except as listed in HPI, PMH and Problem List.  SH:  Social History   Socioeconomic History   Marital status: Single    Spouse name: Not on file   Number of children: 2   Years of education: Not on file   Highest education level: 6th grade  Occupational History   Not on file  Tobacco Use   Smoking status: Former    Current packs/day: 0.00    Average packs/day: 0.5 packs/day for 52.0 years (26.0 ttl pk-yrs)    Types: Cigarettes    Start date: 07/05/1970    Quit date: 07/05/2022    Years since quitting: 1.1  Smokeless tobacco: Never  Vaping Use   Vaping status: Never Used  Substance and Sexual Activity   Alcohol use: No    Alcohol/week: 0.0 standard drinks of alcohol    Comment: Stopped 2009   Drug use: No   Sexual activity: Not Currently  Other Topics Concern   Not on file  Social History Narrative   ** Merged History Encounter **       Social Drivers of Health   Financial Resource Strain: High Risk (04/12/2022)   Overall Financial Resource Strain (CARDIA)    Difficulty of Paying Living Expenses: Hard  Food Insecurity: Food Insecurity Present (07/16/2023)   Hunger Vital Sign     Worried About Running Out of Food in the Last Year: Sometimes true    Ran Out of Food in the Last Year: Sometimes true  Transportation Needs: No Transportation Needs (07/16/2023)   PRAPARE - Administrator, Civil Service (Medical): No    Lack of Transportation (Non-Medical): No  Recent Concern: Transportation Needs - Unmet Transportation Needs (05/25/2023)   PRAPARE - Administrator, Civil Service (Medical): Yes    Lack of Transportation (Non-Medical): No  Physical Activity: Inactive (02/05/2017)   Exercise Vital Sign    Days of Exercise per Week: 0 days    Minutes of Exercise per Session: 0 min  Stress: Stress Concern Present (02/05/2017)   [employer] of Occupational Health - Occupational Stress Questionnaire    Feeling of Stress : Rather much  Social Connections: Unknown (07/16/2023)   Social Connection and Isolation Panel    Frequency of Communication with Friends and Family: Twice a week    Frequency of Social Gatherings with Friends and Family: Once a week    Attends Religious Services: Patient declined    Database administrator or Organizations: No    Attends Banker Meetings: Never    Marital Status: Divorced  Catering manager Violence: Not At Risk (07/16/2023)   Humiliation, Afraid, Rape, and Kick questionnaire    Fear of Current or Ex-Partner: No    Emotionally Abused: No    Physically Abused: No    Sexually Abused: No    FH:  Family History  Problem Relation Age of Onset   Hypertension Mother    Prostate cancer Brother    Mental illness Neg Hx     Past Medical History:  Diagnosis Date   Acute respiratory failure with hypoxia (HCC) 04/09/2022   AKI (acute kidney injury) (HCC)    Anginal pain (HCC)    Left side if chest ,NTG  relieves chaes apin 12/01/13   Anxiety    Arthritis    Auditory hallucination 03/18/2015   Blunt trauma of lower leg 10/08/2013   Cancer (HCC)    colon cancer   CHF (congestive heart failure) (HCC)     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 01/01/2015   Complication of anesthesia    wake up with a head ache   COPD (chronic obstructive pulmonary disease) (HCC)    Coronary artery disease    Edema 08/16/2010   GERD (gastroesophageal reflux disease)    Headache    related to sinus congestion   History of blood transfusion    History of kidney stones    History of MI (myocardial infarction) 10/08/2013   Hypertension    Hypokalemia 04/09/2022   Intracranial bleed (HCC) 12/31/2018   Lactic acidosis 04/09/2022   Leg cramps 08/16/2010   Malignant  neoplasm of prostate (HCC) 07/13/2016   Multiple closed facial bone fractures (HCC)    Myocardial infarction [facility])    '09 AND '12   Peripheral vascular disease (HCC)    Shortness of breath    With exertion .   Tibia/fibula fracture 10/06/2013   Tibial plateau fracture 12/29/2014   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HCC) 10/08/2013   UTI (urinary tract infection)    frequent UTI   Visual hallucination 03/18/2015    Current Outpatient Medications  Medication Sig Dispense Refill   albuterol  (ACCUNEB ) 0.63 MG/3ML nebulizer solution Take 3 mLs (0.63 mg total) by nebulization every 6 (six) hours as needed for wheezing. 75 mL 12   albuterol  (VENTOLIN  HFA) 108 (90 Base) MCG/ACT inhaler TAKE TWO PUFFS BY MOUTH AS NEEDED FOR WHEEZING EVERY 4-6 HOURS (Patient taking differently: Inhale 2 puffs into the lungs every 4 (four) hours as needed for wheezing or shortness of breath.) 8.5 g 5   ALPRAZolam  (XANAX ) 0.5 MG tablet Take 1 tablet (0.5 mg total) by mouth 3 (three) times daily as needed for anxiety. 10 tablet 0   apixaban  (ELIQUIS ) 5 MG TABS tablet TAKE ONE TABLET (5 MG TOTAL) BY MOUTH TWO (TWO) TIMES DAILY. (Patient taking differently: Take 5 mg by mouth 2 (two) times daily.) 60 tablet 11   bisoprolol  (ZEBETA ) 5 MG tablet Take 0.5 tablets (2.5 mg total) by mouth daily. 45 tablet 3   budesonide  (PULMICORT ) 0.25 MG/2ML  nebulizer solution Take 2 mLs (0.25 mg total) by nebulization 2 (two) times daily. 60 mL 12   cetirizine  (ZYRTEC ) 10 MG tablet Take 1 tablet (10 mg total) by mouth daily. (Patient taking differently: Take 10 mg by mouth in the morning.) 30 tablet 11   cholecalciferol  (VITAMIN D3) 25 MCG (1000 UNIT) tablet Take 1,000 Units by mouth in the morning.     FARXIGA  10 MG TABS tablet TAKE ONE TABLET (10 MG TOTAL) BY MOUTH DAILY. (Patient taking differently: Take 10 mg by mouth in the morning.) 90 tablet 1   feeding supplement (ENSURE ENLIVE / ENSURE PLUS) LIQD Take 237 mLs by mouth 2 (two) times daily between meals. 10000 mL 0   fluticasone  (FLONASE ) 50 MCG/ACT nasal spray Place 1 spray into both nostrils daily as needed for allergies.     furosemide  (LASIX ) 20 MG tablet Take 2 tablets (40 mg total) by mouth daily. (Patient taking differently: Take 40 mg by mouth in the morning.) 60 tablet 11   gabapentin  (NEURONTIN ) 100 MG capsule TAKE ONE CAPSULE (100 MG TOTAL) BY MOUTH THREE (THREE) TIMES DAILY. (Patient taking differently: Take 100 mg by mouth 3 (three) times daily.) 90 capsule 1   guaiFENesin  (MUCINEX ) 600 MG 12 hr tablet Take 2 tablets (1,200 mg total) by mouth 2 (two) times daily. 60 tablet 0   lidocaine  (LIDODERM ) 5 % Place 1 patch onto the skin daily. Remove & Discard patch within 12 hours or as directed by MD 30 patch 11   Multiple Vitamin (MULTIVITAMIN) tablet Take 1 tablet by mouth in the morning. For Men     Omega-3 Fatty Acids (FISH OIL PO) Take 1,000 mg by mouth every evening.     omeprazole  (PRILOSEC) 40 MG capsule Take 1 capsule (40 mg total) by mouth 2 (two) times daily. 180 capsule 1   oxyCODONE  (OXY IR/ROXICODONE ) 5 MG immediate release tablet Take 1 tablet (5 mg total) by mouth every 6 (six) hours as needed for severe pain (pain score 7-10). 20 tablet 0   OXYGEN  Inhale  3 L/min into the lungs continuous.     potassium chloride  SA (KLOR-CON  M) 20 MEQ tablet TAKE ONE TABLET (20 MEQ TOTAL) BY  MOUTH DAILY. 30 tablet 0   rOPINIRole  (REQUIP ) 1 MG tablet TAKE ONE TABLET (1 MG TOTAL) BY MOUTH IN THE MORNING AND AT BEDTIME. (Patient taking differently: Take 1 mg by mouth.) 60 tablet 3   rosuvastatin  (CRESTOR ) 20 MG tablet Take 1 tablet (20 mg total) by mouth daily. (Patient taking differently: Take 20 mg by mouth every evening.) 30 tablet 2   sildenafil  (REVATIO ) 20 MG tablet Take 1 tablet (20 mg total) by mouth 3 (three) times daily. 90 tablet 5   Tiotropium Bromide-Olodaterol (STIOLTO RESPIMAT ) 2.5-2.5 MCG/ACT AERS Inhale 2 puffs into the lungs daily. (Patient taking differently: Inhale 2 puffs into the lungs in the morning.) 4 g 12   No current facility-administered medications for this visit.    There were no vitals filed for this visit. Sitting SBP 118  Standing 108   Wt Readings from Last 3 Encounters:  08/06/23 162 lb (73.5 kg)  07/26/23 160 lb (72.6 kg)  07/23/23 161 lb (73 kg)    PHYSICAL EXAM: General:  thin chronically ill appearing WM, in WC . Using [place] HEENT: normal Neck: supple. no JVD. Carotids 2+ bilat; no bruits. No lymphadenopathy or thyromegaly appreciated. Cor: PMI nondisplaced. Regular rate & rhythm. No rubs, gallops or murmurs. Lungs: clear on 3 liters [place].  Abdomen: soft, nontender, nondistended. No hepatosplenomegaly. No bruits or masses. Good bowel sounds. Extremities: no cyanosis, clubbing, rash, edema Neuro: alert & oriented x 3, cranial nerves grossly intact. moves all 4 extremities w/o difficulty. Affect pleasant.    ECG:NSR 70 bpm personally checked.    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 PVR 5.  - Continue Eliquis  5 mg twice a day indefinitely.   He has missed a doses a few days a week - Continue sildenafil  20 mg tid . Of note he is only taking twice a day.  - VQ scan 5/24 multiple VQ  mismatches concerning for chronic PE -  PFTs 12/24 FEV1 1.97 (64%) FVC 3.27 (77%) Ratio 60%  post BD FEV1 2.58 (69%) DLCO 40% - CT 4/25 No acute PE. Chronic occlusion RML and RLL PA. 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 Chronic Hypoxic Respiratory Failure - COPD at b/l + submassive PE - Continue oxygen  3 liters [place]> Sats stable. -   3. PAF  - Regular on exam.  - Continue amio 200 mg daily.  Not ideal with COPD.  - Continue Eliquis  5 mg twice a day.  - Check TSH free T3 Free T4.    4. Chronic Biventricular HFrEF  - 04/10/22 Echo EF down 20-25% with D shaped septum RV Failure, EF previously 40-45%.  - R/LHC c/w severe NICM. Minimal CAD, moderate to severe PAH with evidence of cor pulmonale - POCUS echo 4/24 - EF 40% RV remains dilated and reduced.  - Echo 3/25 EF 25-30% RV mildly reduced - NYHA IV - Continue Farxiga  10 mg daily  - Off  Spiro due to elevated K.  - Continue losartan  12.5 mg QHS.  - Continue Digoxin  0.125    5.  Lung nodule - Needs follow up CT in 3 months.  - Needs follow up with [facility]    6. Former Smoker  - Smoking 3-4 cigarettes.  - Discussed cessation   7. Prostate Cancer Treated with seeds    Jules Oar MD 10:44 AM

## 2023-08-21 ENCOUNTER — Telehealth (HOSPITAL_COMMUNITY): Payer: Self-pay

## 2023-08-21 NOTE — Telephone Encounter (Signed)
 Attempted to reach pt to sch home visit for med rec and to follow up with hospice care in the home yet.  His phone is not even ringing, so not sure what is going on with that. Message saying call cannot be completed.  I reached out to [facility] and the admission was on the 13th but nobody has been out to see him yet but is sch to see him tomor.  I asked nurse on phone about med list and she said since nurse is going out tomor then things will more than likely change with his meds so it was best if we touch base with nurse after her visit tomor.  Her name is Isa Manuel so will call back later this week and speak with her.   Alfonza Angry, EMT-Paramedic  [phone] 08/21/2023

## 2023-08-22 ENCOUNTER — Other Ambulatory Visit: Payer: Self-pay | Admitting: Family

## 2023-08-22 ENCOUNTER — Ambulatory Visit: Admitting: Family

## 2023-08-27 ENCOUNTER — Telehealth (HOSPITAL_COMMUNITY): Payer: Self-pay

## 2023-08-27 NOTE — Telephone Encounter (Signed)
 Attempted to reach out to Mr. Bosserman for follow up- no answer. Contacted Child psychotherapist with [facility] who advised he was transitioned to [facility] in [place] and is admitted in their in patient unit. I will make HF team aware and he will be discharged from paramedicine. Call complete.   Powell Mirza, EMT-Paramedic [phone] 08/27/2023

## 2023-09-01 ENCOUNTER — Encounter: Payer: Self-pay | Admitting: Family

## 2023-09-01 DIAGNOSIS — J9620 Acute and chronic respiratory failure, unspecified whether with hypoxia or hypercapnia: Secondary | ICD-10-CM | POA: Diagnosis not present

## 2023-09-01 DIAGNOSIS — R0609 Other forms of dyspnea: Secondary | ICD-10-CM | POA: Diagnosis not present

## 2023-09-01 DIAGNOSIS — J449 Chronic obstructive pulmonary disease, unspecified: Secondary | ICD-10-CM | POA: Diagnosis not present

## 2023-09-01 DIAGNOSIS — J479 Bronchiectasis, uncomplicated: Secondary | ICD-10-CM | POA: Diagnosis not present

## 2023-09-01 NOTE — Assessment & Plan Note (Signed)
 Patient is seen by Pulmonology, who manage this condition.  He is well controlled with current therapy.   Will defer to them for further changes to plan of care.

## 2023-09-01 NOTE — Progress Notes (Signed)
 Established Patient Office Visit  Subjective:  Patient ID: Bruce [name], male    DOB: [birth year]  Age: 78 y.o. MRN: [id number]  Chief Complaint  Patient presents with   Hospitalization Follow-up    Patient is here today for follow up after recent hospitalization.  He was admitted due to respiratory issues after catching influenza a.   He is feeling slightly better today, but is still having some shortness of breath.  Says that he needs refills for a few of his medications.   Otherwise, he does have the home paramedicine services and will ensure he has case management as well.   No other concerns today.     No other concerns at this time.   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 01/01/2015   Complication of anesthesia    wake up with a head ache   COPD (chronic obstructive pulmonary disease) (HCC)    Coronary artery disease    DVT of leg (deep venous thrombosis) (HCC) 08/16/2010   Edema 08/16/2010   GERD (gastroesophageal reflux disease)    Headache    related to sinus congestion   History of blood transfusion    History of CAD (coronary artery disease) 07/02/2023   History of cigarette smoking 07/02/2023   History of colon cancer 07/02/2023   History of DVT (deep vein thrombosis) 07/02/2023   History of kidney stones    History of lung cancer 07/02/2023   History of MI (myocardial infarction) 10/08/2013   History of prostate cancer 07/02/2023   History of pulmonary embolism 07/02/2023   Hypertension    Hypokalemia 04/09/2022   Influenza A  04/18/2023   Intracranial bleed (HCC) 12/31/2018   Lactic acidosis 04/09/2022   Leg cramps 08/16/2010   Malignant neoplasm of prostate (HCC) 07/13/2016   Multiple closed facial bone fractures (HCC)    Myocardial infarction [facility])    '09 AND '12   Peripheral vascular disease (HCC)    Severe sepsis (HCC) 07/03/2023   Shortness of breath    With exertion .   Tibia/fibula fracture 10/06/2013   Tibial plateau fracture 12/29/2014   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HCC) 10/08/2013   UTI (urinary tract infection)    frequent UTI   Visual hallucination 03/18/2015    Past Surgical History:  Procedure Laterality Date   CARDIAC CATHETERIZATION     CHOLECYSTECTOMY     EXTERNAL FIXATION LEG Right 10/06/2013   Procedure: CLOSED REDUCTION RIGHT TIBIAL PLATEAU FRACTURE, EXTERNAL FIXATION RIGHT LEG, PLACEMENT OF WOUND VAC;  Surgeon: Ozell VEAR Bruch, MD;  Location: [facility] OR;  Service: Orthopedics;  Laterality: Right;   FEMORAL-POPLITEAL BYPASS GRAFT Right 10/06/2013   Procedure: RIGHT POPLITEAL-POPLITEAL ARTERY BYPASS GRAFT;  Surgeon: Krystal JULIANNA Doing, MD;  Location: [facility] OR;  Service: Vascular;  Laterality: Right;   FRACTURE SURGERY Right 2014   HARDWARE REMOVAL Right 12/02/2013   Procedure: REMOVAL EXTERNAL FIXATION RIGHT LEG ;  Surgeon: Ozell VEAR Bruch, MD;  Location: [facility] OR;  Service: Orthopedics;  Laterality: Right;  HERNIA REPAIR Right 1990's   I & D EXTREMITY Right 10/09/2013   Procedure: IRRIGATION AND DEBRIDEMENT RIGHT LEG, CLOSURE  OF WOUNDS, PLACEMENT OF WOUND VAC ON EACH SIDE OF LEG;  Surgeon: Ozell VEAR Bruch, MD;  Location: [facility] OR;  Service: Orthopedics;  Laterality: Right;   IR ANGIOGRAM PULMONARY RIGHT SELECTIVE  04/10/2022   IR ANGIOGRAM SELECTIVE EACH ADDITIONAL VESSEL  04/10/2022   IR NEPHROSTOMY PLACEMENT LEFT  06/19/2016   IR THORACENTESIS ASP PLEURAL SPACE W/IMG GUIDE  05/31/2023   IR THROMBECT PRIM MECH INIT (INCLU) MOD SED  04/10/2022   IR US  GUIDE VASC ACCESS RIGHT  04/10/2022   IVC filter  2009    placed @ [facility]/ Removed in 2010.   IVC Filter Removed     LEFT HEART CATH AND CORONARY ANGIOGRAPHY Right 10/11/2018   Procedure: LEFT HEART CATH AND CORONARY ANGIOGRAPHY;  Surgeon: Fernand Denyse LABOR, MD;  Location: [facility] INVASIVE CV LAB;  Service: Cardiovascular;  Laterality: Right;   ligament leg Left    NEPHROLITHOTOMY Left 06/19/2016   Procedure: NEPHROLITHOTOMY PERCUTANEOUS;  Surgeon: Rosina Riis, MD;  Location: [facility] ORS;  Service: Urology;  Laterality: Left;   ORIF TIBIA FRACTURE Right 12/29/2014   Procedure: OPEN REDUCTION INTERNAL FIXATION (ORIF) RIGHT TIBIA FRACTURE, RIA VS ICBG;  Surgeon: Ozell Bruch, MD;  Location: [facility] OR;  Service: Orthopedics;  Laterality: Right;   RADIOACTIVE SEED IMPLANT N/A 09/12/2016   Procedure: RADIOACTIVE SEED IMPLANT/BRACHYTHERAPY IMPLANT;  Surgeon: Riis Rosina, MD;  Location: [facility] ORS;  Service: Urology;  Laterality: N/A;   RIGHT/LEFT HEART CATH AND CORONARY ANGIOGRAPHY N/A 04/13/2022   Procedure: RIGHT/LEFT HEART CATH AND CORONARY ANGIOGRAPHY;  Surgeon: Cherrie Toribio SAUNDERS, MD;  Location: [facility] INVAS[facility] LAB;  Service: Cardiovascular;  Laterality: N/A;    Social History   Socioeconomic History   Marital status: Single    Spouse name: Not on file   Number of children: 2   Years of education: Not on file   Highest education level: 6th grade  Occupational History   Not on file  Tobacco Use   Smoking status: Former    Current packs/day: 0.00    Average packs/day: 0.5 packs/day for 52.0 years (26.0 ttl pk-yrs)    Types: Cigarettes    Start date: 07/05/1970    Quit date: 07/05/2022    Years since quitting: 1.1   Smokeless tobacco: Never  Vaping Use   Vaping status: Never Used  Substance and Sexual Activity   Alcohol use: No    Alcohol/week: 0.0 standard drinks of alcohol    Comment: Stopped 2009   Drug use: No   Sexual activity: Not Currently  Other Topics Concern   Not on file  Social History Narrative   ** Merged History Encounter **        Social Drivers of Health   Financial Resource Strain: High Risk (04/12/2022)   Overall Financial Resource Strain (CARDIA)    Difficulty of Paying Living Expenses: Hard  Food Insecurity: Food Insecurity Present (07/16/2023)   Hunger Vital Sign    Worried About Running Out of Food in the Last Year: Sometimes true    Ran Out of Food in the Last Year: Sometimes true  Transportation Needs: No Transportation Needs (07/16/2023)   PRAPARE - Administrator, Civil Service (Medical): No    Lack of Transportation (Non-Medical): No  Recent Concern: Transportation Needs - Unmet Transportation Needs (05/25/2023)   PRAPARE - Administrator, Civil Service (Medical): Yes  Lack of Transportation (Non-Medical): No  Physical Activity: Inactive (02/05/2017)   Exercise Vital Sign    Days of Exercise per Week: 0 days    Minutes of Exercise per Session: 0 min  Stress: Stress Concern Present (02/05/2017)   [employer] of Occupational Health - Occupational Stress Questionnaire    Feeling of Stress : Rather much  Social Connections: Unknown (07/16/2023)   Social Connection and Isolation Panel    Frequency of Communication with Friends and Family: Twice a week    Frequency of Social Gatherings with Friends and Family: Once a week    Attends Religious Services: Patient declined    Database administrator or Organizations: No    Attends Banker Meetings: Never    Marital Status: Divorced  Catering manager Violence: Not At Risk (07/16/2023)   Humiliation, Afraid, Rape, and Kick questionnaire    Fear of Current or Ex-Partner: No    Emotionally Abused: No    Physically Abused: No    Sexually Abused: No    Family History  Problem Relation Age of Onset   Hypertension Mother    Prostate cancer Brother    Mental illness Neg Hx     Allergies  Allergen Reactions   Eggs-Apples-Oats [Alitraq] Other (See Comments)    -Caused chest pains   Adhesive [Tape] Other (See  Comments)    After right leg fracture surgery, pt developed a large blister where tape was applied to his right leg. OK to use paper tape.   Imdur  [Isosorbide  Dinitrate] Other (See Comments)    -hallucinations   Singulair  [Montelukast  Sodium] Other (See Comments)    -Hallucinations     Review of Systems  Respiratory:  Positive for cough, shortness of breath and wheezing.   All other systems reviewed and are negative.      Objective:   BP (!) 148/88   Pulse 91   Ht 6' 1 (1.854 m)   Wt 156 lb 3.2 oz (70.9 kg)   SpO2 (!) 88% Comment: resting on room air  BMI 20.61 kg/m   Vitals:   05/02/23 1055  BP: (!) 148/88  Pulse: 91  Height: 6' 1 (1.854 m)  Weight: 156 lb 3.2 oz (70.9 kg)  SpO2: (!) 88% Comment: resting on room air  BMI (Calculated): 20.61    Physical Exam Vitals and nursing note reviewed.  Constitutional:      General: He is not in acute distress.    Appearance: Normal appearance. He is normal weight. He is ill-appearing.  HENT:     Head: Normocephalic and atraumatic.   Eyes:     Conjunctiva/sclera: Conjunctivae normal.     Pupils: Pupils are equal, round, and reactive to light.    Cardiovascular:     Rate and Rhythm: Normal rate and regular rhythm.     Pulses: Normal pulses.     Heart sounds: Normal heart sounds.  Pulmonary:     Effort: Pulmonary effort is normal.     Breath sounds: Wheezing and rhonchi present.   Musculoskeletal:        General: Normal range of motion.   Neurological:     General: No focal deficit present.     Mental Status: He is alert and oriented to person, place, and time. Mental status is at baseline.   Psychiatric:        Mood and Affect: Mood normal.        Behavior: Behavior normal.        Thought  Content: Thought content normal.        Judgment: Judgment normal.      No results found for any visits on 05/02/23.  Recent Results (from the past 2160 hours)  CBC with Differential     Status: Abnormal    Collection Time: 06/04/23  8:21 AM  Result Value Ref Range   WBC 9.5 4.0 - 10.5 K/uL   RBC 4.04 (L) 4.22 - 5.81 MIL/uL   Hemoglobin 12.2 (L) 13.0 - 17.0 g/dL   HCT 62.5 (L) 60.9 - 47.9 %   MCV 92.6 80.0 - 100.0 fL   MCH 30.2 26.0 - 34.0 pg   MCHC 32.6 30.0 - 36.0 g/dL   RDW 84.4 88.4 - 84.4 %   Platelets 358 150 - 400 K/uL   nRBC 0.0 0.0 - 0.2 %   Neutrophils Relative % 83 %   Neutro Abs 7.8 (H) 1.7 - 7.7 K/uL   Lymphocytes Relative 9 %   Lymphs Abs 0.9 0.7 - 4.0 K/uL   Monocytes Relative 6 %   Monocytes Absolute 0.6 0.1 - 1.0 K/uL   Eosinophils Relative 2 %   Eosinophils Absolute 0.2 0.0 - 0.5 K/uL   Basophils Relative 0 %   Basophils Absolute 0.0 0.0 - 0.1 K/uL   Immature Granulocytes 0 %   Abs Immature Granulocytes 0.04 0.00 - 0.07 K/uL    Comment: Performed at [facility], [address], [place], [place] [address]  Basic metabolic panel     Status: Abnormal   Collection Time: 06/04/23  8:21 AM  Result Value Ref Range   Sodium 133 (L) 135 - 145 mmol/L   Potassium 4.3 3.5 - 5.1 mmol/L   Chloride 99 98 - 111 mmol/L   CO2 27 22 - 32 mmol/L   Glucose, Bld 144 (H) 70 - 99 mg/dL    Comment: Glucose reference range applies only to samples taken after fasting for at least 8 hours.   BUN 35 (H) 8 - 23 mg/dL   Creatinine, Ser 9.06 0.61 - 1.24 mg/dL   Calcium  8.9 8.9 - 10.3 mg/dL   GFR, Estimated >39 >39 mL/min    Comment: (NOTE) Calculated using the CKD-EPI Creatinine Equation (2021)    Anion gap 7 5 - 15    Comment: Performed at [facility], [address] [place], [place] [address]  CBC with Differential     Status: Abnormal   Collection Time: 06/05/23  6:52 AM  Result Value Ref Range   WBC 9.8 4.0 - 10.5 K/uL   RBC 3.91 (L) 4.22 - 5.81 MIL/uL   Hemoglobin 12.0 (L) 13.0 - 17.0 g/dL   HCT 63.1 (L) 60.9 - 47.9 %   MCV 94.1 80.0 - 100.0 fL   MCH 30.7 26.0 - 34.0 pg   MCHC 32.6 30.0 - 36.0 g/dL   RDW 84.2 (H) 88.4 - 84.4 %   Platelets 336 150 - 400 K/uL    nRBC 0.0 0.0 - 0.2 %   Neutrophils Relative % 82 %   Neutro Abs 8.1 (H) 1.7 - 7.7 K/uL   Lymphocytes Relative 9 %   Lymphs Abs 0.9 0.7 - 4.0 K/uL   Monocytes Relative 7 %   Monocytes Absolute 0.6 0.1 - 1.0 K/uL   Eosinophils Relative 1 %   Eosinophils Absolute 0.1 0.0 - 0.5 K/uL   Basophils Relative 0 %   Basophils Absolute 0.0 0.0 - 0.1 K/uL   Immature Granulocytes 1 %  Abs Immature Granulocytes 0.05 0.00 - 0.07 K/uL    Comment: Performed at [facility], [address] [place], [place] [address]  Basic metabolic panel     Status: Abnormal   Collection Time: 06/05/23  6:52 AM  Result Value Ref Range   Sodium 136 135 - 145 mmol/L   Potassium 4.4 3.5 - 5.1 mmol/L   Chloride 101 98 - 111 mmol/L   CO2 27 22 - 32 mmol/L   Glucose, Bld 96 70 - 99 mg/dL    Comment: Glucose reference range applies only to samples taken after fasting for at least 8 hours.   BUN 36 (H) 8 - 23 mg/dL   Creatinine, Ser 9.02 0.61 - 1.24 mg/dL   Calcium  9.1 8.9 - 10.3 mg/dL   GFR, Estimated >39 >39 mL/min    Comment: (NOTE) Calculated using the CKD-EPI Creatinine Equation (2021)    Anion gap 8 5 - 15    Comment: Performed at [facility], [address] [place], [place] [address]  CBC with Differential     Status: Abnormal   Collection Time: 06/06/23  6:17 AM  Result Value Ref Range   WBC 10.8 (H) 4.0 - 10.5 K/uL   RBC 3.83 (L) 4.22 - 5.81 MIL/uL   Hemoglobin 11.5 (L) 13.0 - 17.0 g/dL   HCT 64.7 (L) 60.9 - 47.9 %   MCV 91.9 80.0 - 100.0 fL   MCH 30.0 26.0 - 34.0 pg   MCHC 32.7 30.0 - 36.0 g/dL   RDW 84.0 (H) 88.4 - 84.4 %   Platelets 325 150 - 400 K/uL   nRBC 0.0 0.0 - 0.2 %   Neutrophils Relative % 84 %   Neutro Abs 9.2 (H) 1.7 - 7.7 K/uL   Lymphocytes Relative 7 %   Lymphs Abs 0.7 0.7 - 4.0 K/uL   Monocytes Relative 7 %   Monocytes Absolute 0.7 0.1 - 1.0 K/uL   Eosinophils Relative 1 %   Eosinophils Absolute 0.1 0.0 - 0.5 K/uL   Basophils Relative 0 %   Basophils Absolute 0.0  0.0 - 0.1 K/uL   Immature Granulocytes 1 %   Abs Immature Granulocytes 0.05 0.00 - 0.07 K/uL    Comment: Performed at [facility], [address], [place], [place] [address]  Basic metabolic panel     Status: Abnormal   Collection Time: 06/06/23  6:17 AM  Result Value Ref Range   Sodium 135 135 - 145 mmol/L   Potassium 4.7 3.5 - 5.1 mmol/L   Chloride 105 98 - 111 mmol/L   CO2 23 22 - 32 mmol/L   Glucose, Bld 95 70 - 99 mg/dL    Comment: Glucose reference range applies only to samples taken after fasting for at least 8 hours.   BUN 36 (H) 8 - 23 mg/dL   Creatinine, Ser 9.11 0.61 - 1.24 mg/dL   Calcium  8.9 8.9 - 10.3 mg/dL   GFR, Estimated >39 >39 mL/min    Comment: (NOTE) Calculated using the CKD-EPI Creatinine Equation (2021)    Anion gap 7 5 - 15    Comment: Performed at [facility], [address], [place], [place] [address]  Basic Metabolic Panel (BMET)     Status: Abnormal   Collection Time: 06/14/23  2:55 PM  Result Value Ref Range   Sodium 137 135 - 145 mmol/L   Potassium 3.7 3.5 - 5.1 mmol/L   Chloride 103 98 - 111 mmol/L  CO2 23 22 - 32 mmol/L   Glucose, Bld 108 (H) 70 - 99 mg/dL    Comment: Glucose reference range applies only to samples taken after fasting for at least 8 hours.   BUN 15 8 - 23 mg/dL   Creatinine, Ser 8.97 0.61 - 1.24 mg/dL   Calcium  9.1 8.9 - 10.3 mg/dL   GFR, Estimated >39 >39 mL/min    Comment: (NOTE) Calculated using the CKD-EPI Creatinine Equation (2021)    Anion gap 11 5 - 15    Comment: Performed at [facility], [address] [place], [place] [address]  B Nat Peptide     Status: Abnormal   Collection Time: 06/14/23  2:55 PM  Result Value Ref Range   B Natriuretic Peptide 994.7 (H) 0.0 - 100.0 pg/mL    Comment: Performed at [facility], [address] [place], [place] [address]  CBC     Status: Abnormal   Collection Time: 06/14/23  2:55 PM  Result Value Ref Range   WBC 7.3 4.0 - 10.5 K/uL   RBC 3.77 (L)  4.22 - 5.81 MIL/uL   Hemoglobin 11.4 (L) 13.0 - 17.0 g/dL   HCT 64.7 (L) 60.9 - 47.9 %   MCV 93.4 80.0 - 100.0 fL   MCH 30.2 26.0 - 34.0 pg   MCHC 32.4 30.0 - 36.0 g/dL   RDW 83.8 (H) 88.4 - 84.4 %   Platelets 328 150 - 400 K/uL   nRBC 0.0 0.0 - 0.2 %    Comment: Performed at [facility], [address] [place], [place] [address]  Urinalysis, Routine w reflex microscopic -Urine, Clean Catch     Status: Abnormal   Collection Time: 07/02/23 12:55 AM  Result Value Ref Range   Color, Urine YELLOW YELLOW   APPearance CLEAR CLEAR   Specific Gravity, Urine 1.017 1.005 - 1.030   pH 6.0 5.0 - 8.0   Glucose, UA 150 (A) NEGATIVE mg/dL   Hgb urine dipstick NEGATIVE NEGATIVE   Bilirubin Urine NEGATIVE NEGATIVE   Ketones, ur NEGATIVE NEGATIVE mg/dL   Protein, ur NEGATIVE NEGATIVE mg/dL   Nitrite NEGATIVE NEGATIVE   Leukocytes,Ua NEGATIVE NEGATIVE    Comment: Performed at [facility], [address], [place], [place] [address]  Urine Culture (for pregnant, neutropenic or urologic patients or patients with an indwelling urinary catheter)     Status: Abnormal   Collection Time: 07/02/23  1:06 AM   Specimen: Urine, Clean Catch  Result Value Ref Range   Specimen Description URINE, CLEAN CATCH    Special Requests      Normal Performed at [facility], [address] [place], [place] [address]    Culture MULTIPLE SPECIES PRESENT, SUGGEST RECOLLECTION (A)    Report Status 07/04/2023 FINAL   Blood Culture (routine x 2)     Status: None   Collection Time: 07/02/23 11:50 AM   Specimen: BLOOD  Result Value Ref Range   Specimen Description BLOOD RIGHT ANTECUBITAL    Special Requests      BOTTLES DRAWN AEROBIC AND ANAEROBIC Blood Culture adequate volume   Culture      NO GROWTH 5 DAYS Performed at [facility], [address], [place], [place] [address]    Report Status 07/07/2023 FINAL   Comprehensive metabolic panel     Status: Abnormal   Collection Time: 07/02/23  12:04 PM  Result Value Ref Range   Sodium 142 135 -  145 mmol/L   Potassium 3.4 (L) 3.5 - 5.1 mmol/L   Chloride 108 98 - 111 mmol/L   CO2 22 22 - 32 mmol/L   Glucose, Bld 102 (H) 70 - 99 mg/dL    Comment: Glucose reference range applies only to samples taken after fasting for at least 8 hours.   BUN 16 8 - 23 mg/dL   Creatinine, Ser 9.02 0.61 - 1.24 mg/dL   Calcium  9.2 8.9 - 10.3 mg/dL   Total Protein 6.6 6.5 - 8.1 g/dL   Albumin  2.7 (L) 3.5 - 5.0 g/dL   AST 24 15 - 41 U/L   ALT 23 0 - 44 U/L   Alkaline Phosphatase 102 38 - 126 U/L   Total Bilirubin 0.9 0.0 - 1.2 mg/dL   GFR, Estimated >39 >39 mL/min    Comment: (NOTE) Calculated using the CKD-EPI Creatinine Equation (2021)    Anion gap 12 5 - 15    Comment: Performed at [facility], [address] [place], [place] [address]  CBC with Differential     Status: Abnormal   Collection Time: 07/02/23 12:04 PM  Result Value Ref Range   WBC 7.7 4.0 - 10.5 K/uL   RBC 3.91 (L) 4.22 - 5.81 MIL/uL   Hemoglobin 11.7 (L) 13.0 - 17.0 g/dL   HCT 62.6 (L) 60.9 - 47.9 %   MCV 95.4 80.0 - 100.0 fL   MCH 29.9 26.0 - 34.0 pg   MCHC 31.4 30.0 - 36.0 g/dL   RDW 83.0 (H) 88.4 - 84.4 %   Platelets 312 150 - 400 K/uL   nRBC 0.0 0.0 - 0.2 %   Neutrophils Relative % 75 %   Neutro Abs 5.8 1.7 - 7.7 K/uL   Lymphocytes Relative 19 %   Lymphs Abs 1.4 0.7 - 4.0 K/uL   Monocytes Relative 5 %   Monocytes Absolute 0.4 0.1 - 1.0 K/uL   Eosinophils Relative 0 %   Eosinophils Absolute 0.0 0.0 - 0.5 K/uL   Basophils Relative 1 %   Basophils Absolute 0.0 0.0 - 0.1 K/uL   Immature Granulocytes 0 %   Abs Immature Granulocytes 0.02 0.00 - 0.07 K/uL    Comment: Performed at [facility], [address] [place], [place] [address]  Protime-INR     Status: Abnormal   Collection Time: 07/02/23 12:04 PM  Result Value Ref Range   Prothrombin Time 16.2 (H) 11.4 - 15.2 seconds   INR 1.3 (H) 0.8 - 1.2    Comment: (NOTE) INR goal varies based on  device and disease states. Performed at [facility], [address] [place], [place] [address]   Blood Culture (routine x 2)     Status: None   Collection Time: 07/02/23 12:04 PM   Specimen: BLOOD  Result Value Ref Range   Specimen Description BLOOD LEFT ANTECUBITAL    Special Requests      BOTTLES DRAWN AEROBIC AND ANAEROBIC Blood Culture adequate volume   Culture      NO GROWTH 5 DAYS Performed at [facility], [address] [place], [place] [address]    Report Status 07/07/2023 FINAL   I-Stat Lactic Acid, ED     Status: Abnormal   Collection Time: 07/02/23 12:21 PM  Result Value Ref Range   Lactic Acid, Venous 2.2 (HH) 0.5 - 1.9 mmol/L   Comment NOTIFIED PHYSICIAN   Lactic acid, plasma     Status: Abnormal   Collection  Time: 07/02/23  4:05 PM  Result Value Ref Range   Lactic Acid, Venous 2.5 (HH) 0.5 - 1.9 mmol/L    Comment: CRITICAL RESULT CALLED TO, READ BACK BY AND VERIFIED WITH EMERSON TRADER, RN AT 1652 04.28.25 D. BLU Performed at [facility], [address], [place], [place] [address]   Respiratory (~20 pathogens) panel by PCR     Status: None   Collection Time: 07/02/23  7:38 PM   Specimen: Nasopharyngeal Swab; Respiratory  Result Value Ref Range   Adenovirus NOT DETECTED NOT DETECTED   Coronavirus 229E NOT DETECTED NOT DETECTED    Comment: (NOTE) The Coronavirus on the Respiratory Panel, DOES NOT test for the novel  Coronavirus (2019 nCoV)    Coronavirus HKU1 NOT DETECTED NOT DETECTED   Coronavirus NL63 NOT DETECTED NOT DETECTED   Coronavirus OC43 NOT DETECTED NOT DETECTED   Metapneumovirus NOT DETECTED NOT DETECTED   Rhinovirus / Enterovirus NOT DETECTED NOT DETECTED   Influenza A NOT DETECTED NOT DETECTED   Influenza B NOT DETECTED NOT DETECTED   Parainfluenza Virus 1 NOT DETECTED NOT DETECTED   Parainfluenza Virus 2 NOT DETECTED NOT DETECTED   Parainfluenza Virus 3 NOT DETECTED NOT DETECTED   Parainfluenza Virus 4 NOT DETECTED NOT  DETECTED   Respiratory Syncytial Virus NOT DETECTED NOT DETECTED   Bordetella pertussis NOT DETECTED NOT DETECTED   Bordetella Parapertussis NOT DETECTED NOT DETECTED   Chlamydophila pneumoniae NOT DETECTED NOT DETECTED   Mycoplasma pneumoniae NOT DETECTED NOT DETECTED    Comment: Performed at [facility], [address], [place], [place] [address]  Lactic acid, plasma     Status: Abnormal   Collection Time: 07/02/23 11:05 PM  Result Value Ref Range   Lactic Acid, Venous 4.0 (HH) 0.5 - 1.9 mmol/L    Comment: CRITICAL VALUE NOTED. VALUE IS CONSISTENT WITH PREVIOUSLY REPORTED/CALLED VALUE Performed at [facility], [address] [place], [place] [address]   Lactic acid, plasma     Status: Abnormal   Collection Time: 07/03/23  5:08 AM  Result Value Ref Range   Lactic Acid, Venous 2.0 (HH) 0.5 - 1.9 mmol/L    Comment: CRITICAL VALUE NOTED. VALUE IS CONSISTENT WITH PREVIOUSLY REPORTED/CALLED VALUE Performed at [facility], [address], [place], [place] [address]   Comprehensive metabolic panel     Status: Abnormal   Collection Time: 07/03/23  5:13 AM  Result Value Ref Range   Sodium 138 135 - 145 mmol/L   Potassium 3.6 3.5 - 5.1 mmol/L   Chloride 108 98 - 111 mmol/L   CO2 21 (L) 22 - 32 mmol/L   Glucose, Bld 165 (H) 70 - 99 mg/dL    Comment: Glucose reference range applies only to samples taken after fasting for at least 8 hours.   BUN 13 8 - 23 mg/dL   Creatinine, Ser 9.19 0.61 - 1.24 mg/dL   Calcium  8.3 (L) 8.9 - 10.3 mg/dL   Total Protein 6.1 (L) 6.5 - 8.1 g/dL   Albumin  2.7 (L) 3.5 - 5.0 g/dL   AST 19 15 - 41 U/L   ALT 19 0 - 44 U/L   Alkaline Phosphatase 79 38 - 126 U/L   Total Bilirubin 0.7 0.0 - 1.2 mg/dL   GFR, Estimated >39 >39 mL/min    Comment: (NOTE) Calculated using the CKD-EPI Creatinine Equation (2021)    Anion gap 9 5 - 15    Comment: Performed at [facility], [address], [place], [place] [address]  CBC     Status: Abnormal    Collection Time: 07/03/23  5:13 AM  Result Value Ref Range   WBC 6.3 4.0 - 10.5 K/uL   RBC 3.27 (L) 4.22 - 5.81 MIL/uL   Hemoglobin 9.8 (L) 13.0 - 17.0 g/dL   HCT 69.3 (L) 60.9 - 47.9 %   MCV 93.6 80.0 - 100.0 fL   MCH 30.0 26.0 - 34.0 pg   MCHC 32.0 30.0 - 36.0 g/dL   RDW 83.2 (H) 88.4 - 84.4 %   Platelets 231 150 - 400 K/uL   nRBC 0.0 0.0 - 0.2 %    Comment: Performed at [facility], [address], [place], [place] [address]  Lactic acid, plasma     Status: Abnormal   Collection Time: 07/03/23  6:47 AM  Result Value Ref Range   Lactic Acid, Venous 2.7 (HH) 0.5 - 1.9 mmol/L    Comment: CRITICAL RESULT CALLED TO, READ BACK BY AND VERIFIED WITH E.KELLY RN @0852  04.29.2025 E.AHMED Performed at [facility], [address] [place], [place] [address]   Lactic acid, plasma     Status: Abnormal   Collection Time: 07/03/23  1:14 PM  Result Value Ref Range   Lactic Acid, Venous 3.0 (HH) 0.5 - 1.9 mmol/L    Comment: CRITICAL VALUE NOTED. VALUE IS CONSISTENT WITH PREVIOUSLY REPORTED/CALLED VALUE Performed at [facility], [address] [place], [place] [address]   CBC     Status: Abnormal   Collection Time: 07/04/23  2:42 AM  Result Value Ref Range   WBC 11.2 (H) 4.0 - 10.5 K/uL   RBC 3.44 (L) 4.22 - 5.81 MIL/uL   Hemoglobin 10.2 (L) 13.0 - 17.0 g/dL   HCT 67.4 (L) 60.9 - 47.9 %   MCV 94.5 80.0 - 100.0 fL   MCH 29.7 26.0 - 34.0 pg   MCHC 31.4 30.0 - 36.0 g/dL   RDW 82.8 (H) 88.4 - 84.4 %   Platelets 265 150 - 400 K/uL   nRBC 0.2 0.0 - 0.2 %    Comment: Performed at [facility], [address] [place], [place] [address]  Comprehensive metabolic panel with GFR     Status: Abnormal   Collection Time: 07/04/23  2:42 AM  Result Value Ref Range   Sodium 136 135 - 145 mmol/L   Potassium 3.9 3.5 - 5.1 mmol/L   Chloride 109 98 - 111 mmol/L   CO2 20 (L) 22 - 32 mmol/L   Glucose, Bld 133 (H) 70 - 99 mg/dL    Comment: Glucose reference range applies only  to samples taken after fasting for at least 8 hours.   BUN 18 8 - 23 mg/dL   Creatinine, Ser 8.98 0.61 - 1.24 mg/dL   Calcium  8.9 8.9 - 10.3 mg/dL   Total Protein 6.0 (L) 6.5 - 8.1 g/dL   Albumin  2.6 (L) 3.5 - 5.0 g/dL   AST 21 15 - 41 U/L   ALT 21 0 - 44 U/L   Alkaline Phosphatase 81 38 - 126 U/L   Total Bilirubin 0.8 0.0 - 1.2 mg/dL   GFR, Estimated >39 >39 mL/min    Comment: (NOTE) Calculated using the CKD-EPI Creatinine Equation (2021)    Anion gap 7 5 - 15    Comment: Performed at [facility], [address] [place], [place] [address]  Lactic acid, plasma  Status: None   Collection Time: 07/04/23  2:42 AM  Result Value Ref Range   Lactic Acid, Venous 1.5 0.5 - 1.9 mmol/L    Comment: Performed at [facility], [address] [place], [place] [address]  Basic metabolic panel with GFR     Status: None   Collection Time: 07/05/23  2:50 AM  Result Value Ref Range   Sodium 138 135 - 145 mmol/L   Potassium 3.6 3.5 - 5.1 mmol/L   Chloride 107 98 - 111 mmol/L   CO2 22 22 - 32 mmol/L   Glucose, Bld 92 70 - 99 mg/dL    Comment: Glucose reference range applies only to samples taken after fasting for at least 8 hours.   BUN 21 8 - 23 mg/dL   Creatinine, Ser 8.80 0.61 - 1.24 mg/dL   Calcium  8.9 8.9 - 10.3 mg/dL   GFR, Estimated >39 >39 mL/min    Comment: (NOTE) Calculated using the CKD-EPI Creatinine Equation (2021)    Anion gap 9 5 - 15    Comment: Performed at [facility], [address], [place], [place] [address]  CBC with Differential/Platelet     Status: Abnormal   Collection Time: 07/05/23  2:50 AM  Result Value Ref Range   WBC 8.8 4.0 - 10.5 K/uL   RBC 3.53 (L) 4.22 - 5.81 MIL/uL   Hemoglobin 10.6 (L) 13.0 - 17.0 g/dL   HCT 66.7 (L) 60.9 - 47.9 %   MCV 94.1 80.0 - 100.0 fL   MCH 30.0 26.0 - 34.0 pg   MCHC 31.9 30.0 - 36.0 g/dL   RDW 82.9 (H) 88.4 - 84.4 %   Platelets 278 150 - 400 K/uL   nRBC 0.0 0.0 - 0.2 %   Neutrophils Relative % 81 %    Neutro Abs 7.1 1.7 - 7.7 K/uL   Lymphocytes Relative 11 %   Lymphs Abs 1.0 0.7 - 4.0 K/uL   Monocytes Relative 7 %   Monocytes Absolute 0.6 0.1 - 1.0 K/uL   Eosinophils Relative 0 %   Eosinophils Absolute 0.0 0.0 - 0.5 K/uL   Basophils Relative 0 %   Basophils Absolute 0.0 0.0 - 0.1 K/uL   Immature Granulocytes 1 %   Abs Immature Granulocytes 0.04 0.00 - 0.07 K/uL    Comment: Performed at [facility], [address] [place], [place] [address]  SARS Coronavirus 2 by RT PCR (hospital order, performed in [facility] hospital lab) *cepheid single result test* Anterior Nasal Swab     Status: None   Collection Time: 07/16/23 10:55 AM   Specimen: Anterior Nasal Swab  Result Value Ref Range   SARS Coronavirus 2 by RT PCR NEGATIVE NEGATIVE    Comment: Performed at [facility], [address] [place], [place] [address]  Basic metabolic panel     Status: Abnormal   Collection Time: 07/16/23 11:19 AM  Result Value Ref Range   Sodium 138 135 - 145 mmol/L   Potassium 4.2 3.5 - 5.1 mmol/L   Chloride 105 98 - 111 mmol/L   CO2 21 (L) 22 - 32 mmol/L   Glucose, Bld 104 (H) 70 - 99 mg/dL    Comment: Glucose reference range applies only to samples taken after fasting for at least 8 hours.   BUN 16 8 - 23 mg/dL   Creatinine, Ser 9.00 0.61 - 1.24 mg/dL   Calcium  9.3 8.9 - 10.3 mg/dL   GFR, Estimated >  60 >60 mL/min    Comment: (NOTE) Calculated using the CKD-EPI Creatinine Equation (2021)    Anion gap 12 5 - 15    Comment: Performed at [facility], [address], [place], [place] [address]  Troponin I (High Sensitivity)     Status: Abnormal   Collection Time: 07/16/23 11:19 AM  Result Value Ref Range   Troponin I (High Sensitivity) 39 (H) <18 ng/L    Comment: (NOTE) Elevated high sensitivity troponin I (hsTnI) values and significant  changes across serial measurements may suggest ACS but many other  chronic and acute conditions are known to elevate hsTnI results.  Refer to  the Links section for chest pain algorithms and additional  guidance. Performed at [facility], [address], [place], [place] [address]   CBC with Differential     Status: Abnormal   Collection Time: 07/16/23 11:19 AM  Result Value Ref Range   WBC 7.5 4.0 - 10.5 K/uL   RBC 3.94 (L) 4.22 - 5.81 MIL/uL   Hemoglobin 11.8 (L) 13.0 - 17.0 g/dL   HCT 62.1 (L) 60.9 - 47.9 %   MCV 95.9 80.0 - 100.0 fL   MCH 29.9 26.0 - 34.0 pg   MCHC 31.2 30.0 - 36.0 g/dL   RDW 82.0 (H) 88.4 - 84.4 %   Platelets 213 150 - 400 K/uL   nRBC 0.0 0.0 - 0.2 %   Neutrophils Relative % 80 %   Neutro Abs 6.0 1.7 - 7.7 K/uL   Lymphocytes Relative 13 %   Lymphs Abs 1.0 0.7 - 4.0 K/uL   Monocytes Relative 6 %   Monocytes Absolute 0.5 0.1 - 1.0 K/uL   Eosinophils Relative 1 %   Eosinophils Absolute 0.1 0.0 - 0.5 K/uL   Basophils Relative 0 %   Basophils Absolute 0.0 0.0 - 0.1 K/uL   Immature Granulocytes 0 %   Abs Immature Granulocytes 0.03 0.00 - 0.07 K/uL    Comment: Performed at [facility], [address] [place], [place] [address]  Brain natriuretic peptide     Status: Abnormal   Collection Time: 07/16/23 11:20 AM  Result Value Ref Range   B Natriuretic Peptide 1,450.8 (H) 0.0 - 100.0 pg/mL    Comment: Performed at [facility], [address] [place], [place] [address]  Troponin I (High Sensitivity)     Status: Abnormal   Collection Time: 07/16/23 12:54 PM  Result Value Ref Range   Troponin I (High Sensitivity) 40 (H) <18 ng/L    Comment: (NOTE) Elevated high sensitivity troponin I (hsTnI) values and significant  changes across serial measurements may suggest ACS but many other  chronic and acute conditions are known to elevate hsTnI results.  Refer to the Links section for chest pain algorithms and additional  guidance. Performed at [facility], [address], [place], [place] [address]   Basic metabolic panel     Status: Abnormal   Collection Time: 07/18/23  10:40 AM  Result Value Ref Range   Sodium 135 135 - 145 mmol/L   Potassium 4.1 3.5 - 5.1 mmol/L   Chloride 103 98 - 111 mmol/L   CO2 23 22 - 32 mmol/L   Glucose, Bld 159 (H) 70 - 99 mg/dL    Comment: Glucose reference range applies only to samples taken after fasting for at least 8 hours.   BUN 24 (H) 8 - 23 mg/dL   Creatinine, Ser 8.98 0.61 -  1.24 mg/dL   Calcium  9.5 8.9 - 10.3 mg/dL   GFR, Estimated >39 >39 mL/min    Comment: (NOTE) Calculated using the CKD-EPI Creatinine Equation (2021)    Anion gap 9 5 - 15    Comment: Performed at [facility], [address] [place], [place] [address]  Basic metabolic panel with GFR     Status: Abnormal   Collection Time: 07/26/23 11:52 AM  Result Value Ref Range   Glucose 99 70 - 99 mg/dL   BUN 16 8 - 27 mg/dL   Creatinine, Ser 8.98 0.76 - 1.27 mg/dL   eGFR 78 >40 fO/fpw/8.26   BUN/Creatinine Ratio 16 10 - 24   Sodium 139 134 - 144 mmol/L   Potassium 4.4 3.5 - 5.2 mmol/L   Chloride 103 96 - 106 mmol/L   CO2 17 (L) 20 - 29 mmol/L   Calcium  9.7 8.6 - 10.2 mg/dL  TSH     Status: Abnormal   Collection Time: 07/26/23 11:52 AM  Result Value Ref Range   TSH 4.910 (H) 0.450 - 4.500 uIU/mL  T4, free     Status: None   Collection Time: 07/26/23 11:52 AM  Result Value Ref Range   Free T4 1.54 0.82 - 1.77 ng/dL  T3, free     Status: None   Collection Time: 07/26/23 11:52 AM  Result Value Ref Range   T3, Free 2.3 2.0 - 4.4 pg/mL       Assessment & Plan COPD with acute exacerbation (HCC) Acute on chronic respiratory failure with hypoxia (HCC) Patient is seen by Pulmonology, who manage this condition.  He is well controlled with current therapy.   Will defer to them for further changes to plan of care.  Acute on chronic systolic CHF (congestive heart failure) (HCC) Patient is seen by Cardiology, who manage this condition.  He is well controlled with current therapy.   Will defer to them for further changes to plan of  care.  Generalized anxiety disorder MDD (major depressive disorder), recurrent episode, moderate (HCC) Patient stable.  Well controlled with current therapy.   Continue current meds.      Return in about 1 month (around 05/30/2023).   Total time spent: 20 minutes  ALAN CHRISTELLA ARRANT, FNP  05/02/2023   This document may have been prepared by [facility] Voice Recognition software and as such may include unintentional dictation errors.

## 2023-09-01 NOTE — Assessment & Plan Note (Signed)
 Patient stable.  Well controlled with current therapy.   Continue current meds.

## 2023-09-01 NOTE — Assessment & Plan Note (Signed)
Patient is seen by Cardiology, who manage this condition.  He is well controlled with current therapy.   Will defer to them for further changes to plan of care.  

## 2023-09-03 ENCOUNTER — Other Ambulatory Visit (HOSPITAL_COMMUNITY): Payer: Self-pay | Admitting: Family Medicine

## 2023-09-04 DEATH — deceased

## 2023-10-06 ENCOUNTER — Encounter: Payer: Self-pay | Admitting: Family

## 2023-10-06 NOTE — Assessment & Plan Note (Signed)
 Patient stable.  Well controlled with current therapy.   Continue current meds.

## 2023-10-06 NOTE — Progress Notes (Signed)
 Established Patient Office Visit  Subjective:  Patient ID: Bruce [name], male    DOB: 1946/02/22  Age: 78 y.o. MRN: [id number]  Chief Complaint  Patient presents with   Hospitalization Follow-up    Patient here for hospital folllow up.  He was admitted recently for Hypoxia and a CHF exacerbation.  Doing better after his hospitalization, says that he feels better overall.  Also being followed by TOC.   No other concerns at this time.   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 01/01/2015   Complication of anesthesia    wake up with a head ache   COPD (chronic obstructive pulmonary disease) (HCC)    Coronary artery disease    DVT of leg (deep venous thrombosis) (HCC) 08/16/2010   Edema 08/16/2010   GERD (gastroesophageal reflux disease)    Headache    related to sinus congestion   History of blood transfusion    History of CAD (coronary artery disease) 07/02/2023   History of cigarette smoking 07/02/2023   History of colon cancer 07/02/2023   History of DVT (deep vein thrombosis) 07/02/2023   History of kidney stones    History of lung cancer 07/02/2023   History of MI (myocardial infarction) 10/08/2013   History of prostate cancer 07/02/2023   History of pulmonary embolism 07/02/2023   Hypertension    Hypokalemia 04/09/2022   Influenza A 04/18/2023   Intracranial bleed (HCC) 12/31/2018   Lactic acidosis 04/09/2022   Leg cramps 08/16/2010   Malignant neoplasm of prostate (HCC) 07/13/2016   Multiple closed facial bone fractures (HCC)    Myocardial  infarction [facility])    '09 AND '12   Peripheral vascular disease (HCC)    Severe sepsis (HCC) 07/03/2023   Shortness of breath    With exertion .   Tibia/fibula fracture 10/06/2013   Tibial plateau fracture 12/29/2014   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HCC) 10/08/2013   UTI (urinary tract infection)    frequent UTI   Visual hallucination 03/18/2015    Past Surgical History:  Procedure Laterality Date   CARDIAC CATHETERIZATION     CHOLECYSTECTOMY     EXTERNAL FIXATION LEG Right 10/06/2013   Procedure: CLOSED REDUCTION RIGHT TIBIAL PLATEAU FRACTURE, EXTERNAL FIXATION RIGHT LEG, PLACEMENT OF WOUND VAC;  Surgeon: Ozell VEAR Bruch, MD;  Location: [facility] OR;  Service: Orthopedics;  Laterality: Right;   FEMORAL-POPLITEAL BYPASS GRAFT Right 10/06/2013   Procedure: RIGHT POPLITEAL-POPLITEAL ARTERY BYPASS GRAFT;  Surgeon: Krystal JULIANNA Doing, MD;  Location: [name] OR;  Service: Vascular;  Laterality: Right;   FRACTURE SURGERY Right 2014   HARDWARE REMOVAL Right 12/02/2013   Procedure: REMOVAL EXTERNAL FIXATION RIGHT LEG ;  Surgeon: Ozell VEAR Bruch, MD;  Location: [facility] OR;  Service: Orthopedics;  Laterality: Right;   HERNIA REPAIR Right 1990's   I & D EXTREMITY Right 10/09/2013   Procedure: IRRIGATION AND DEBRIDEMENT RIGHT LEG, CLOSURE  OF WOUNDS, PLACEMENT OF WOUND VAC ON EACH SIDE OF LEG;  Surgeon: Ozell VEAR Bruch, MD;  Location:  [facility] OR;  Service: Orthopedics;  Laterality: Right;   IR ANGIOGRAM PULMONARY RIGHT SELECTIVE  04/10/2022   IR ANGIOGRAM SELECTIVE EACH ADDITIONAL VESSEL  04/10/2022   IR NEPHROSTOMY PLACEMENT LEFT  06/19/2016   IR THORACENTESIS ASP PLEURAL SPACE W/IMG GUIDE  05/31/2023   IR THROMBECT PRIM MECH INIT (INCLU) MOD SED  04/10/2022   IR US  GUIDE VASC ACCESS RIGHT  04/10/2022   IVC filter  2009   placed @ [facility]/ Removed in 2010.   IVC Filter Removed     LEFT HEART CATH AND CORONARY ANGIOGRAPHY Right 10/11/2018   Procedure: LEFT HEART CATH AND CORONARY ANGIOGRAPHY;  Surgeon: Fernand Denyse LABOR, MD;  Location: [facility]  INVASIVE CV LAB;  Service: Cardiovascular;  Laterality: Right;   ligament leg Left    NEPHROLITHOTOMY Left 06/19/2016   Procedure: NEPHROLITHOTOMY PERCUTANEOUS;  Surgeon: Rosina Riis, MD;  Location: [facility] ORS;  Service: Urology;  Laterality: Left;   ORIF TIBIA FRACTURE Right 12/29/2014   Procedure: OPEN REDUCTION INTERNAL FIXATION (ORIF) RIGHT TIBIA FRACTURE, RIA VS ICBG;  Surgeon: Ozell Bruch, MD;  Location: [facility] OR;  Service: Orthopedics;  Laterality: Right;   RADIOACTIVE SEED IMPLANT N/A 09/12/2016   Procedure: RADIOACTIVE SEED IMPLANT/BRACHYTHERAPY IMPLANT;  Surgeon: Riis Rosina, MD;  Location: [facility] ORS;  Service: Urology;  Laterality: N/A;   RIGHT/LEFT HEART CATH AND CORONARY ANGIOGRAPHY N/A 04/13/2022   Procedure: RIGHT/LEFT HEART CATH AND CORONARY ANGIOGRAPHY;  Surgeon: Cherrie Toribio SAUNDERS, MD;  Location: [facility] INVASIVE CV LAB;  Service: Cardiovascular;  Laterality: N/A;    Social History   Socioeconomic History   Marital status: Single    Spouse name: Not on file   Number of children: 2   Years of education: Not on file   Highest education level: 6th grade  Occupational History   Not on file  Tobacco Use   Smoking status: Former    Current packs/day: 0.00    Average packs/day: 0.5 packs/day for 52.0 years (26.0 ttl pk-yrs)    Types: Cigarettes    Start date: 07/05/1970    Quit date: 07/05/2022    Years since quitting: 1.2   Smokeless tobacco: Never  Vaping Use   Vaping status: Never Used  Substance and Sexual Activity   Alcohol use: No    Alcohol/week: 0.0 standard drinks of alcohol    Comment: Stopped 2009   Drug use: No   Sexual activity: Not Currently  Other Topics Concern   Not on file  Social History Narrative   ** Merged History Encounter **       Social Drivers of Health   Financial Resource Strain: High Risk (04/12/2022)   Overall Financial Resource Strain (CARDIA)    Difficulty of Paying Living Expenses: Hard  Food Insecurity: Food Insecurity Present  (07/16/2023)   Hunger Vital Sign    Worried About Running Out of Food in the Last Year: Sometimes true    Ran Out of Food in the Last Year: Sometimes true  Transportation Needs: No Transportation Needs (07/16/2023)   PRAPARE - Administrator, Civil Service (Medical): No    Lack of Transportation (Non-Medical): No  Recent Concern: Transportation Needs - Unmet Transportation Needs (05/25/2023)   PRAPARE - Administrator, Civil Service (Medical): Yes    Lack of Transportation (Non-Medical): No  Physical Activity: Inactive (02/05/2017)   Exercise Vital Sign    Days of Exercise per Week: 0 days    Minutes of Exercise per Session: 0 min  Stress: Stress Concern  Present (02/05/2017)   [employer] of Occupational Health - Occupational Stress Questionnaire    Feeling of Stress : Rather much  Social Connections: Unknown (07/16/2023)   Social Connection and Isolation Panel    Frequency of Communication with Friends and Family: Twice a week    Frequency of Social Gatherings with Friends and Family: Once a week    Attends Religious Services: Patient declined    Database administrator or Organizations: No    Attends Banker Meetings: Never    Marital Status: Divorced  Catering manager Violence: Not At Risk (07/16/2023)   Humiliation, Afraid, Rape, and Kick questionnaire    Fear of Current or Ex-Partner: No    Emotionally Abused: No    Physically Abused: No    Sexually Abused: No    Family History  Problem Relation Age of Onset   Hypertension Mother    Prostate cancer Brother    Mental illness Neg Hx     Allergies  Allergen Reactions   Eggs-Apples-Oats [Alitraq] Other (See Comments)    -Caused chest pains   Adhesive [Tape] Other (See Comments)    After right leg fracture surgery, pt developed a large blister where tape was applied to his right leg. OK to use paper tape.   Imdur  [Isosorbide  Dinitrate] Other (See Comments)    -hallucinations    Singulair  [Montelukast  Sodium] Other (See Comments)    -Hallucinations     Review of Systems  Respiratory:  Positive for cough, shortness of breath and wheezing.   All other systems reviewed and are negative.      Objective:   BP (!) 142/82   Pulse 96   Ht 6' 1 (1.854 m)   Wt 150 lb 9.6 oz (68.3 kg)   SpO2 (!) 88% Comment: 4L  BMI 19.87 kg/m   Vitals:   07/11/23 1056  BP: (!) 142/82  Pulse: 96  Height: 6' 1 (1.854 m)  Weight: 150 lb 9.6 oz (68.3 kg)  SpO2: (!) 88% Comment: 4L  BMI (Calculated): 19.87    Physical Exam Vitals and nursing note reviewed.  Constitutional:      Appearance: Normal appearance. He is normal weight.  Eyes:     Pupils: Pupils are equal, round, and reactive to light.  Cardiovascular:     Rate and Rhythm: Normal rate and regular rhythm.     Pulses: Normal pulses.     Heart sounds: Normal heart sounds.  Pulmonary:     Effort: Pulmonary effort is normal.     Breath sounds: Normal breath sounds.  Neurological:     General: No focal deficit present.     Mental Status: He is alert and oriented to person, place, and time.  Psychiatric:        Attention and Perception: Attention normal.        Mood and Affect: Mood is anxious. Affect is flat.        Speech: Speech normal.        Behavior: Behavior normal. Behavior is cooperative.        Thought Content: Thought content normal.        Judgment: Judgment normal.      No results found for any visits on 07/11/23.  Recent Results (from the past 2160 hours)  SARS Coronavirus 2 by RT PCR (hospital order, performed in [facility] hospital lab) *cepheid single result test* Anterior Nasal Swab     Status: None   Collection Time: 07/16/23 10:55 AM  Specimen: Anterior Nasal Swab  Result Value Ref Range   SARS Coronavirus 2 by RT PCR NEGATIVE NEGATIVE    Comment: Performed at [facility], [address] [place], [place] [address]  Basic metabolic panel     Status: Abnormal   Collection  Time: 07/16/23 11:19 AM  Result Value Ref Range   Sodium 138 135 - 145 mmol/L   Potassium 4.2 3.5 - 5.1 mmol/L   Chloride 105 98 - 111 mmol/L   CO2 21 (L) 22 - 32 mmol/L   Glucose, Bld 104 (H) 70 - 99 mg/dL    Comment: Glucose reference range applies only to samples taken after fasting for at least 8 hours.   BUN 16 8 - 23 mg/dL   Creatinine, Ser 9.00 0.61 - 1.24 mg/dL   Calcium  9.3 8.9 - 10.3 mg/dL   GFR, Estimated >39 >39 mL/min    Comment: (NOTE) Calculated using the CKD-EPI Creatinine Equation (2021)    Anion gap 12 5 - 15    Comment: Performed at [facility], [address] [place], [place] [address]  Troponin I (High Sensitivity)     Status: Abnormal   Collection Time: 07/16/23 11:19 AM  Result Value Ref Range   Troponin I (High Sensitivity) 39 (H) <18 ng/L    Comment: (NOTE) Elevated high sensitivity troponin I (hsTnI) values and significant  changes across serial measurements may suggest ACS but many other  chronic and acute conditions are known to elevate hsTnI results.  Refer to the Links section for chest pain algorithms and additional  guidance. Performed at [facility], [address] [place], [place] [address]   CBC with Differential     Status: Abnormal   Collection Time: 07/16/23 11:19 AM  Result Value Ref Range   WBC 7.5 4.0 - 10.5 K/uL   RBC 3.94 (L) 4.22 - 5.81 MIL/uL   Hemoglobin 11.8 (L) 13.0 - 17.0 g/dL   HCT 62.1 (L) 60.9 - 47.9 %   MCV 95.9 80.0 - 100.0 fL   MCH 29.9 26.0 - 34.0 pg   MCHC 31.2 30.0 - 36.0 g/dL   RDW 82.0 (H) 88.4 - 84.4 %   Platelets 213 150 - 400 K/uL   nRBC 0.0 0.0 - 0.2 %   Neutrophils Relative % 80 %   Neutro Abs 6.0 1.7 - 7.7 K/uL   Lymphocytes Relative 13 %   Lymphs Abs 1.0 0.7 - 4.0 K/uL   Monocytes Relative 6 %   Monocytes Absolute 0.5 0.1 - 1.0 K/uL   Eosinophils Relative 1 %   Eosinophils Absolute 0.1 0.0 - 0.5 K/uL   Basophils Relative 0 %   Basophils Absolute 0.0 0.0 - 0.1 K/uL   Immature  Granulocytes 0 %   Abs Immature Granulocytes 0.03 0.00 - 0.07 K/uL    Comment: Performed at [facility], [address], [place], [place] [address]  Brain natriuretic peptide     Status: Abnormal   Collection Time: 07/16/23 11:20 AM  Result Value Ref Range   B Natriuretic Peptide 1,450.8 (H) 0.0 - 100.0 pg/mL    Comment: Performed at [facility], [address], [place], [place] [address]  Troponin I (High Sensitivity)     Status: Abnormal   Collection Time: 07/16/23 12:54 PM  Result Value Ref Range   Troponin I (High Sensitivity) 40 (H) <18 ng/L    Comment: (NOTE) Elevated high sensitivity troponin I (hsTnI) values and  significant  changes across serial measurements may suggest ACS but many other  chronic and acute conditions are known to elevate hsTnI results.  Refer to the Links section for chest pain algorithms and additional  guidance. Performed at [facility], [address], [place], [place] [address]   Basic metabolic panel     Status: Abnormal   Collection Time: 07/18/23 10:40 AM  Result Value Ref Range   Sodium 135 135 - 145 mmol/L   Potassium 4.1 3.5 - 5.1 mmol/L   Chloride 103 98 - 111 mmol/L   CO2 23 22 - 32 mmol/L   Glucose, Bld 159 (H) 70 - 99 mg/dL    Comment: Glucose reference range applies only to samples taken after fasting for at least 8 hours.   BUN 24 (H) 8 - 23 mg/dL   Creatinine, Ser 8.98 0.61 - 1.24 mg/dL   Calcium  9.5 8.9 - 10.3 mg/dL   GFR, Estimated >39 >39 mL/min    Comment: (NOTE) Calculated using the CKD-EPI Creatinine Equation (2021)    Anion gap 9 5 - 15    Comment: Performed at [facility], [address], [place], [place] [address]  Basic metabolic panel with GFR     Status: Abnormal   Collection Time: 07/26/23 11:52 AM  Result Value Ref Range   Glucose 99 70 - 99 mg/dL   BUN 16 8 - 27 mg/dL   Creatinine, Ser 8.98 0.76 - 1.27 mg/dL   eGFR 78 >40 fO/fpw/8.26   BUN/Creatinine Ratio 16 10 - 24   Sodium 139  134 - 144 mmol/L   Potassium 4.4 3.5 - 5.2 mmol/L   Chloride 103 96 - 106 mmol/L   CO2 17 (L) 20 - 29 mmol/L   Calcium  9.7 8.6 - 10.2 mg/dL  TSH     Status: Abnormal   Collection Time: 07/26/23 11:52 AM  Result Value Ref Range   TSH 4.910 (H) 0.450 - 4.500 uIU/mL  T4, free     Status: None   Collection Time: 07/26/23 11:52 AM  Result Value Ref Range   Free T4 1.54 0.82 - 1.77 ng/dL  T3, free     Status: None   Collection Time: 07/26/23 11:52 AM  Result Value Ref Range   T3, Free 2.3 2.0 - 4.4 pg/mL       Assessment & Plan MDD (major depressive disorder), recurrent episode, moderate (HCC) Patient stable.  Well controlled with current therapy.   Continue current meds.   Acute exacerbation of chronic obstructive pulmonary disease (COPD) (HCC) Patient stable.  Well controlled with current therapy.   Continue current meds.   Acute on chronic combined systolic and diastolic CHF (congestive heart failure) (HCC) Patient stable.  Well controlled with current therapy.   Continue current meds.      Return in about 1 month (around 08/11/2023) for F/U.   Total time spent: 30 minutes  ALAN CHRISTELLA ARRANT, FNP  07/11/2023   This document may have been prepared by [facility] Voice Recognition software and as such may include unintentional dictation errors.
